# Patient Record
Sex: Female | Born: 1945
Health system: Southern US, Community
[De-identification: ages and names within clinical notes are randomized; demographics above are authoritative.]

## PROBLEM LIST (undated history)

## (undated) DIAGNOSIS — D472 Monoclonal gammopathy: Secondary | ICD-10-CM

## (undated) DIAGNOSIS — I5030 Unspecified diastolic (congestive) heart failure: Secondary | ICD-10-CM

## (undated) DIAGNOSIS — Z9289 Personal history of other medical treatment: Secondary | ICD-10-CM

## (undated) DIAGNOSIS — Z862 Personal history of diseases of the blood and blood-forming organs and certain disorders involving the immune mechanism: Secondary | ICD-10-CM

## (undated) DIAGNOSIS — M069 Rheumatoid arthritis, unspecified: Secondary | ICD-10-CM

## (undated) DIAGNOSIS — M1990 Unspecified osteoarthritis, unspecified site: Secondary | ICD-10-CM

## (undated) DIAGNOSIS — D649 Anemia, unspecified: Secondary | ICD-10-CM

## (undated) DIAGNOSIS — E119 Type 2 diabetes mellitus without complications: Secondary | ICD-10-CM

## (undated) DIAGNOSIS — E8581 Light chain (AL) amyloidosis: Secondary | ICD-10-CM

## (undated) DIAGNOSIS — N189 Chronic kidney disease, unspecified: Secondary | ICD-10-CM

## (undated) DIAGNOSIS — E7800 Pure hypercholesterolemia, unspecified: Secondary | ICD-10-CM

## (undated) DIAGNOSIS — I10 Essential (primary) hypertension: Secondary | ICD-10-CM

## (undated) DIAGNOSIS — N183 Chronic kidney disease, stage 3 unspecified: Secondary | ICD-10-CM

## (undated) HISTORY — DX: Unspecified osteoarthritis, unspecified site: M19.90

## (undated) HISTORY — DX: Chronic kidney disease, stage 3 unspecified: N18.30

## (undated) HISTORY — DX: Personal history of other medical treatment: Z92.89

## (undated) HISTORY — DX: Light chain (AL) amyloidosis: E85.81

## (undated) HISTORY — PX: HERNIA REPAIR: SHX51

## (undated) HISTORY — PX: PARATHYROIDECTOMY: SHX19

## (undated) HISTORY — DX: Unspecified diastolic (congestive) heart failure: I50.30

## (undated) HISTORY — DX: Anemia, unspecified: D64.9

## (undated) HISTORY — DX: Essential (primary) hypertension: I10

## (undated) HISTORY — DX: Chronic kidney disease, unspecified: N18.9

## (undated) HISTORY — DX: Personal history of diseases of the blood and blood-forming organs and certain disorders involving the immune mechanism: Z86.2

## (undated) HISTORY — DX: Rheumatoid arthritis, unspecified: M06.9

## (undated) HISTORY — DX: Monoclonal gammopathy: D47.2

---

## 2011-06-21 DIAGNOSIS — Z9089 Acquired absence of other organs: Secondary | ICD-10-CM | POA: Insufficient documentation

## 2011-06-21 DIAGNOSIS — Z9889 Other specified postprocedural states: Secondary | ICD-10-CM | POA: Insufficient documentation

## 2011-06-21 DIAGNOSIS — G4733 Obstructive sleep apnea (adult) (pediatric): Secondary | ICD-10-CM | POA: Insufficient documentation

## 2014-11-30 IMAGING — CR DG SHOULDER 2+V*L*
1 series · 4 of 4 positions shown · non-contrast
Comparison: None.

CLINICAL DATA: Left shoulder base starting yesterday morning

EXAM:
LEFT SHOULDER - 2+ VIEW

[Series 1: dg shoulder left · 0.14mm/px · 4 of 4 slices shown]
[im 1/4]
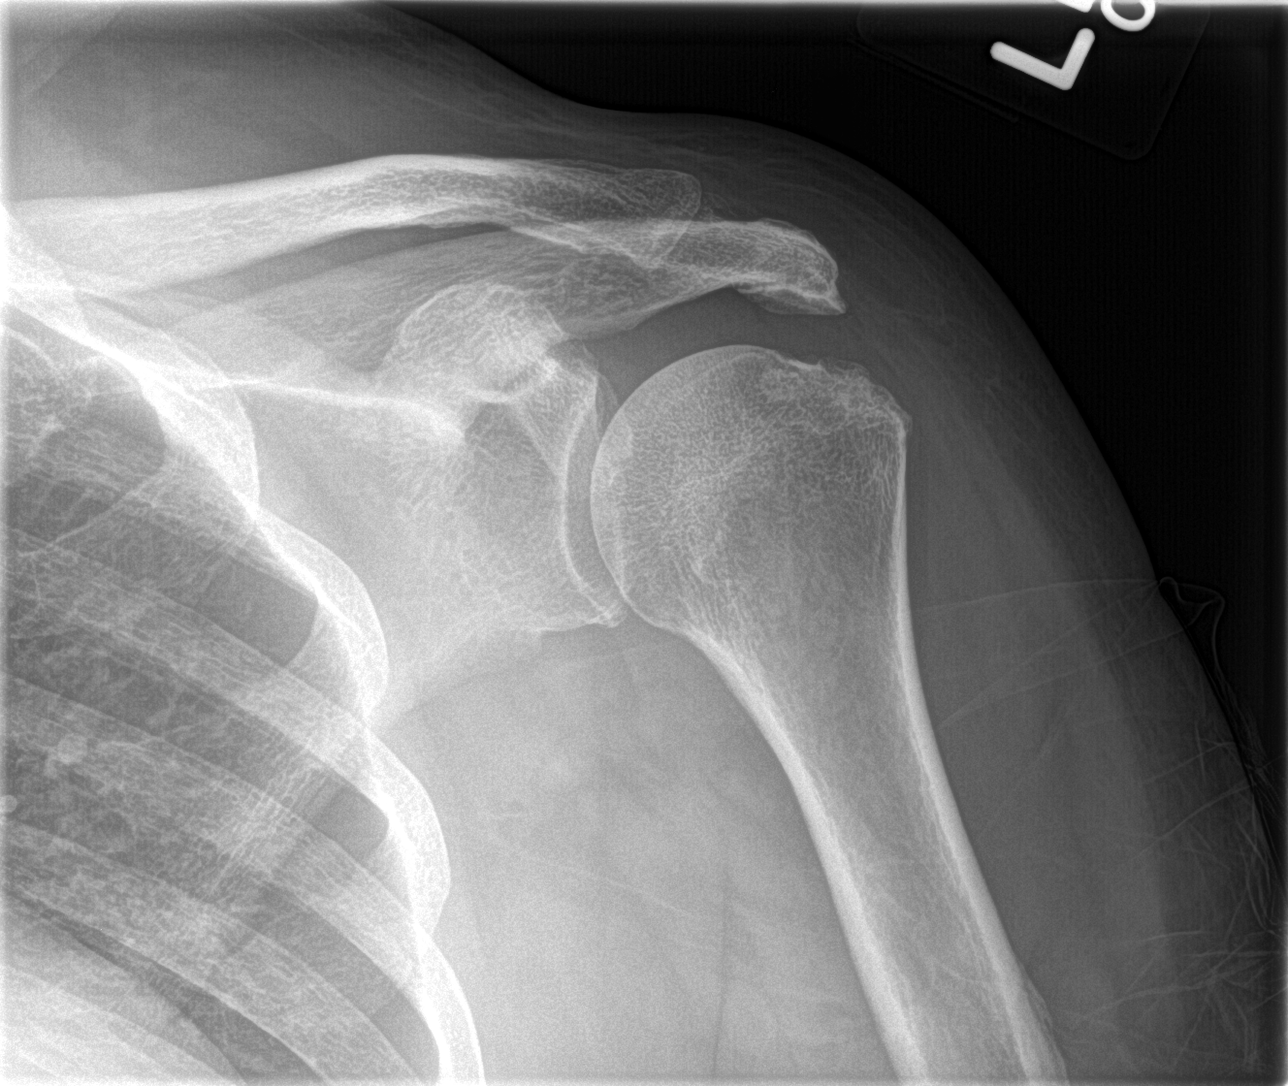
[im 2/4]
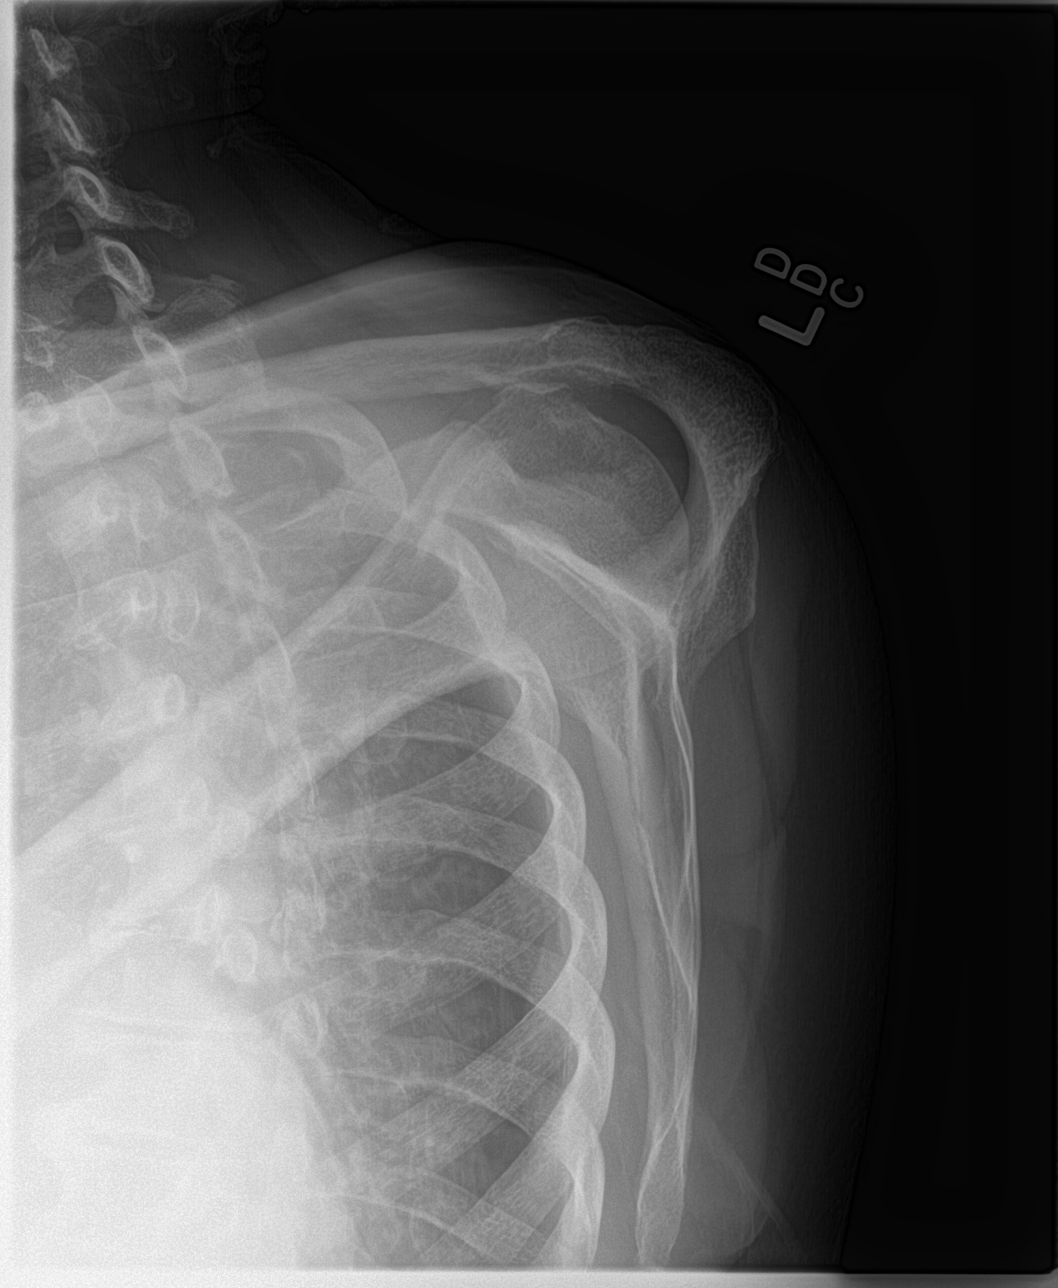
[im 3/4]
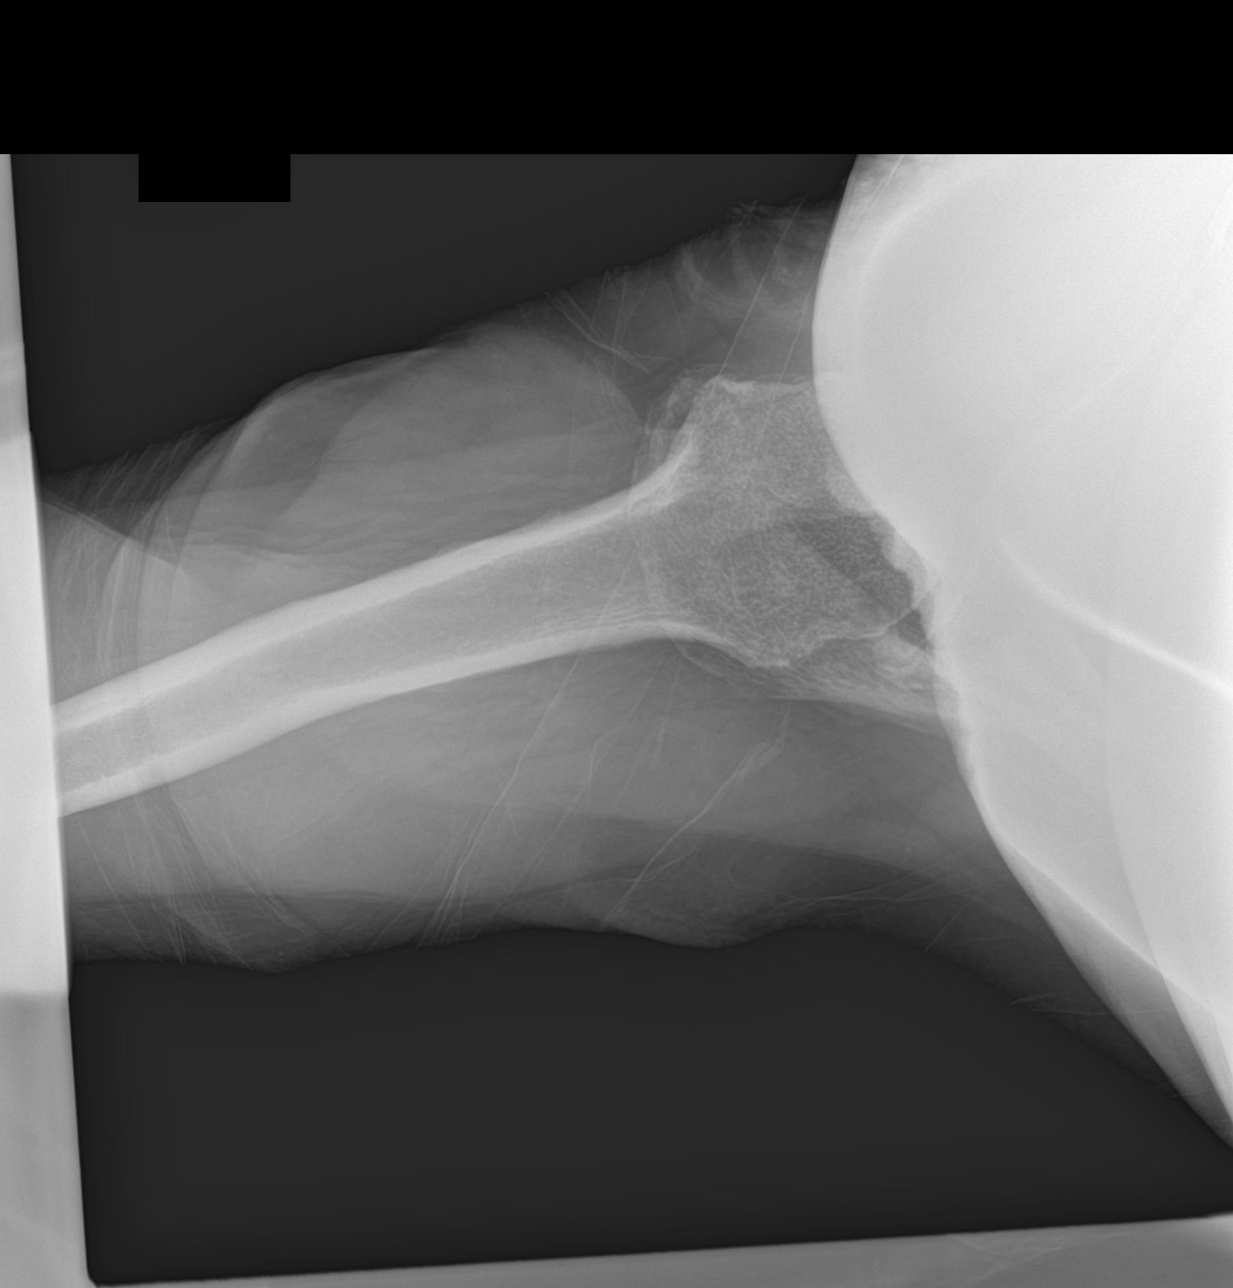
[im 4/4]
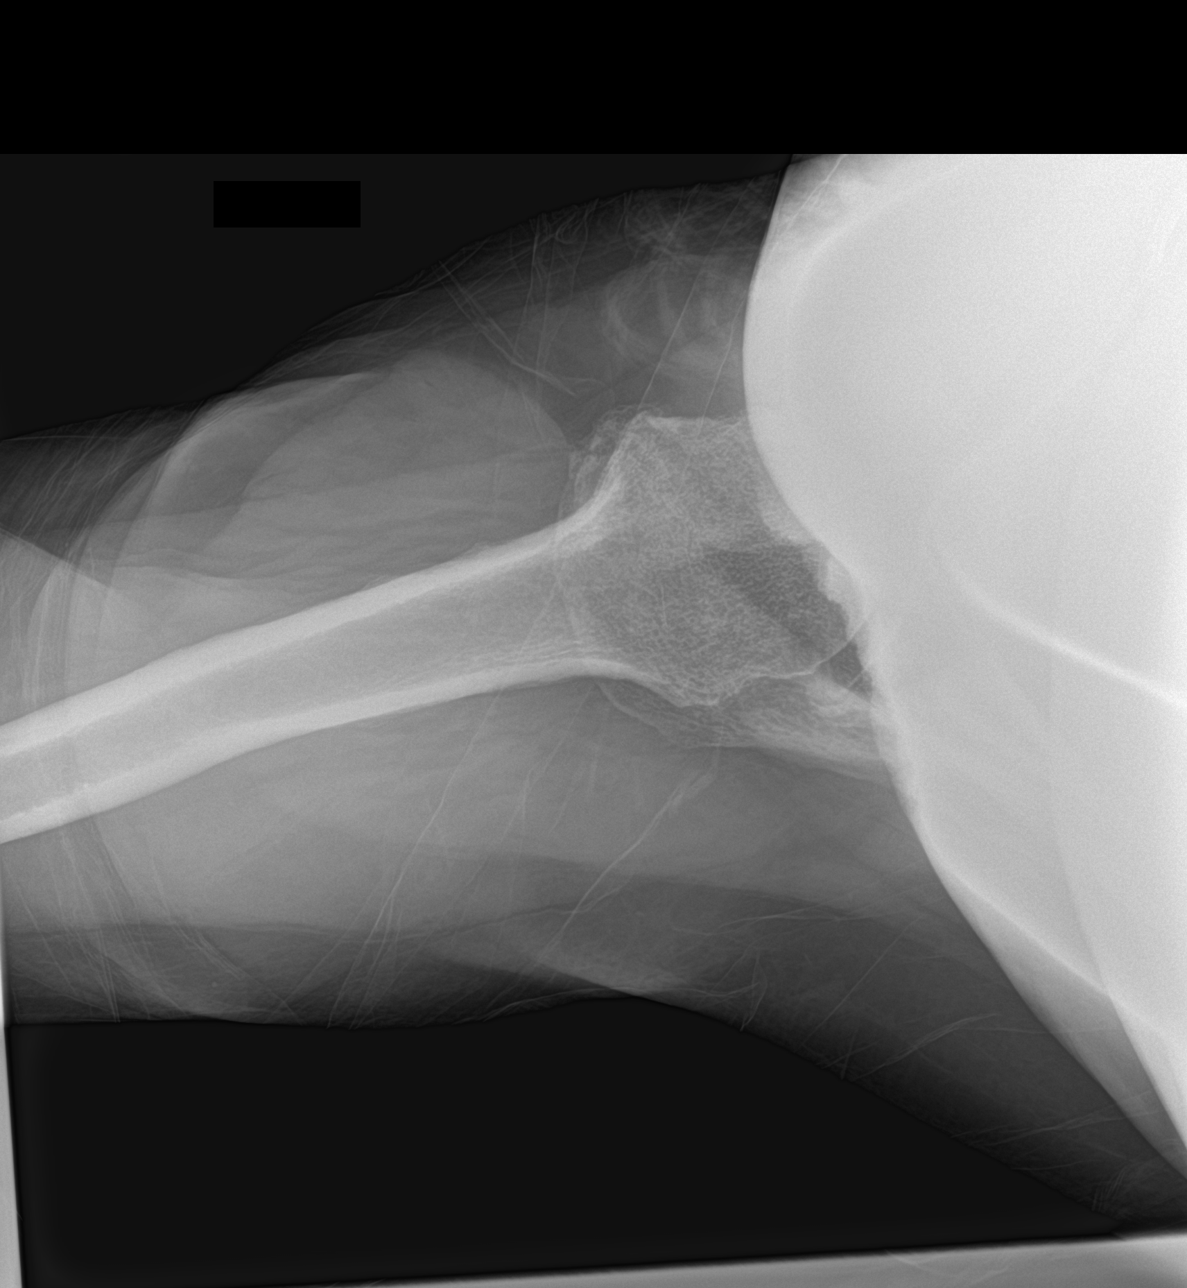

[4 of 4 positions shown; findings below may reference images not displayed]

FINDINGS: Four views of the left shoulder submitted. No acute fracture or
subluxation. Mild degenerative changes AC joint. Mild inferior
spurring of acromion. Mild spurring of superior aspect of glenoid.
IMPRESSION: No acute fracture or subluxation. Degenerative changes as described
above.

## 2014-12-01 ENCOUNTER — Encounter: Payer: Self-pay | Admitting: Emergency Medicine

## 2014-12-01 ENCOUNTER — Emergency Department: Payer: Medicare Other

## 2014-12-01 ENCOUNTER — Emergency Department
Admission: EM | Admit: 2014-12-01 | Discharge: 2014-12-01 | Disposition: A | Discharge status: Home / Self Care | Payer: Medicare Other | Attending: Emergency Medicine | Admitting: Emergency Medicine

## 2014-12-01 ENCOUNTER — Other Ambulatory Visit: Payer: Self-pay

## 2014-12-01 DIAGNOSIS — E119 Type 2 diabetes mellitus without complications: Secondary | ICD-10-CM | POA: Diagnosis not present

## 2014-12-01 DIAGNOSIS — R1013 Epigastric pain: Secondary | ICD-10-CM | POA: Insufficient documentation

## 2014-12-01 DIAGNOSIS — M25512 Pain in left shoulder: Secondary | ICD-10-CM | POA: Diagnosis not present

## 2014-12-01 HISTORY — DX: Type 2 diabetes mellitus without complications: E11.9

## 2014-12-01 HISTORY — DX: Pure hypercholesterolemia, unspecified: E78.00

## 2014-12-01 LAB — COMPREHENSIVE METABOLIC PANEL
ALT: 18 U/L (ref 14–54)
ANION GAP: 10 (ref 5–15)
AST: 22 U/L (ref 15–41)
Albumin: 4.1 g/dL (ref 3.5–5.0)
Alkaline Phosphatase: 48 U/L (ref 38–126)
BILIRUBIN TOTAL: 0.9 mg/dL (ref 0.3–1.2)
BUN: 23 mg/dL — ABNORMAL HIGH (ref 6–20)
CHLORIDE: 102 mmol/L (ref 101–111)
CO2: 30 mmol/L (ref 22–32)
Calcium: 10.1 mg/dL (ref 8.9–10.3)
Creatinine, Ser: 1.17 mg/dL — ABNORMAL HIGH (ref 0.44–1.00)
GFR, EST AFRICAN AMERICAN: 53 mL/min — AB (ref >60)
GFR, EST NON AFRICAN AMERICAN: 46 mL/min — AB (ref >60)
Glucose, Bld: 172 mg/dL — ABNORMAL HIGH (ref 65–99)
POTASSIUM: 3.2 mmol/L — AB (ref 3.5–5.1)
Sodium: 142 mmol/L (ref 135–145)
TOTAL PROTEIN: 8 g/dL (ref 6.5–8.1)

## 2014-12-01 LAB — CBC WITH DIFFERENTIAL/PLATELET
BASOS ABS: 0.1 10*3/uL (ref 0–0.1)
Basophils Relative: 1 %
EOS PCT: 3 %
Eosinophils Absolute: 0.2 10*3/uL (ref 0–0.7)
HCT: 38.8 % (ref 35.0–47.0)
Hemoglobin: 12.2 g/dL (ref 12.0–16.0)
LYMPHS ABS: 3.5 10*3/uL (ref 1.0–3.6)
LYMPHS PCT: 46 %
MCH: 24.2 pg — AB (ref 26.0–34.0)
MCHC: 31.3 g/dL — AB (ref 32.0–36.0)
MCV: 77.2 fL — AB (ref 80.0–100.0)
MONO ABS: 0.6 10*3/uL (ref 0.2–0.9)
MONOS PCT: 8 %
Neutro Abs: 3.2 10*3/uL (ref 1.4–6.5)
Neutrophils Relative %: 42 %
PLATELETS: 338 10*3/uL (ref 150–440)
RBC: 5.03 MIL/uL (ref 3.80–5.20)
RDW: 17.5 % — AB (ref 11.5–14.5)
WBC: 7.5 10*3/uL (ref 3.6–11.0)

## 2014-12-01 LAB — TROPONIN I

## 2014-12-01 MED ORDER — TRAMADOL HCL 50 MG PO TABS: 50.0000 mg | ORAL_TABLET | Freq: Four times a day (QID) | ORAL | Status: AC | PRN

## 2014-12-01 MED ORDER — DIAZEPAM 5 MG PO TABS: 5.0000 mg | ORAL_TABLET | Freq: Three times a day (TID) | ORAL | Status: DC | PRN

## 2014-12-01 MED ORDER — OXYCODONE-ACETAMINOPHEN 5-325 MG PO TABS
2.0000 | ORAL_TABLET | Freq: Once | ORAL | Status: AC
  Administered 2014-12-01: 2 via ORAL
  Filled 2014-12-01: qty 2

## 2014-12-01 NOTE — ED Notes (Signed)
Pt reports having left shoulder pain onset yesterday at 10am, during the night started having "indigestion"

## 2014-12-01 NOTE — Discharge Instructions (Signed)

## 2014-12-01 NOTE — ED Notes (Signed)
Discussed discharge instructions with the patient and family member. No questions or concerns at this time. Pt left with family.

## 2014-12-01 NOTE — ED Provider Notes (Signed)
[facility] Emergency Department Provider Note     Time seen: ----------------------------------------- 7:28 AM on 12/01/2014 -----------------------------------------    I have reviewed the triage vital signs and the nursing notes.   HISTORY  Chief Complaint Shoulder Pain    HPI Nancy Rodriguez is a 69 y.o. female who presents ER with left shoulder pain since yesterday morning at 10 AM. Patient states during the night she started having indigestion as well, recently has had some dietary changes and she has lost weight which is resulted and lower blood sugars. There is pain with range of motion of the left shoulder, denies any other complaints currently   Past Medical History  Diagnosis Date  . Diabetes mellitus without complication   . Hypercholesterolemia     There are no active problems to display for this patient.   Past Surgical History  Procedure Laterality Date  . Parathyroidectomy    . Hernia repair      Allergies Nsaids  Social History Social History  Substance Use Topics  . Smoking status: Never Smoker   . Smokeless tobacco: None  . Alcohol Use: No    Review of Systems Constitutional: Negative for fever. Eyes: Negative for visual changes. ENT: Negative for sore throat. Cardiovascular: Negative for chest pain. Respiratory: Negative for shortness of breath. Gastrointestinal: Negative for abdominal pain, vomiting and diarrhea. Positive for indigestion Genitourinary: Negative for dysuria. Musculoskeletal: Negative for back pain. Positive for left shoulder pain Skin: Negative for rash. Neurological: Negative for headaches, focal weakness or numbness.  10-point ROS otherwise negative.  ____________________________________________   PHYSICAL EXAM:  VITAL SIGNS: ED Triage Vitals  Enc Vitals Group     BP 12/01/14 0712 178/100 mmHg     Pulse Rate 12/01/14 0712 111     Resp 12/01/14 0712 26     Temp 12/01/14 0712 98  F (36.7 C)     Temp src --      SpO2 12/01/14 0712 100 %     Weight 12/01/14 0712 169 lb (76.658 kg)     Height 12/01/14 0712 5\' 3"  (1.6 m)     Head Cir --      Peak Flow --      Pain Score 12/01/14 0712 10     Pain Loc --      Pain Edu? --      Excl. in [place]? --     Constitutional: Alert and oriented. Well appearing and in no distress. Eyes: Conjunctivae are normal. PERRL. Normal extraocular movements. ENT   Head: Normocephalic and atraumatic.   Nose: No congestion/rhinnorhea.   Mouth/Throat: Mucous membranes are moist.   Neck: No stridor. Cardiovascular: Normal rate, regular rhythm. Normal and symmetric distal pulses are present in all extremities. No murmurs, rubs, or gallops. Respiratory: Normal respiratory effort without tachypnea nor retractions. Breath sounds are clear and equal bilaterally. No wheezes/rales/rhonchi. Gastrointestinal: Soft and nontender. No distention. No abdominal bruits.  Musculoskeletal: Mild tenderness about the left shoulder, mild pain with range of motion of the left arm. Neurologic:  Normal speech and language. No gross focal neurologic deficits are appreciated. Speech is normal. No gait instability. Skin:  Skin is warm, dry and intact. No rash noted. Psychiatric: Mood and affect are normal. Speech and behavior are normal. Patient exhibits appropriate insight and judgment. ____________________________________________  EKG: Interpreted by me. Normal sinus rhythm with a rate of 100, low voltage QRS, possible inferior infarct age indeterminate, normal axis.  ____________________________________________  ED COURSE:  Pertinent labs &  imaging results that were available during my care of the patient were reviewed by me and considered in my medical decision making (see chart for details). We'll check cardiac labs as well as obtain shoulder x-rays ____________________________________________    LABS (pertinent positives/negatives)  Labs  Reviewed  CBC WITH DIFFERENTIAL/PLATELET - Abnormal; Notable for the following:    MCV 77.2 (*)    MCH 24.2 (*)    MCHC 31.3 (*)    RDW 17.5 (*)    All other components within normal limits  COMPREHENSIVE METABOLIC PANEL - Abnormal; Notable for the following:    Potassium 3.2 (*)    Glucose, Bld 172 (*)    BUN 23 (*)    Creatinine, Ser 1.17 (*)    GFR calc non Af Amer 46 (*)    GFR calc Af Amer 53 (*)    All other components within normal limits  TROPONIN I    RADIOLOGY Images were viewed by me  Left shoulder films IMPRESSION: No acute fracture or subluxation. Degenerative changes as described above. ____________________________________________  FINAL ASSESSMENT AND PLAN  Shoulder pain  Plan: Patient with labs and imaging as dictated above. Symptoms are likely arthritis related. Her labs are unremarkable. No signs of any cardiac etiology. She stable for outpatient follow-up with her doctor   Earleen Newport, MD   Earleen Newport, MD 12/01/14 [phone]

## 2016-06-12 DIAGNOSIS — F331 Major depressive disorder, recurrent, moderate: Secondary | ICD-10-CM | POA: Insufficient documentation

## 2019-08-25 ENCOUNTER — Other Ambulatory Visit: Payer: Self-pay | Admitting: Nephrology

## 2019-08-25 DIAGNOSIS — E876 Hypokalemia: Secondary | ICD-10-CM

## 2019-08-25 DIAGNOSIS — N1832 Chronic kidney disease, stage 3b: Secondary | ICD-10-CM

## 2019-08-25 DIAGNOSIS — N189 Chronic kidney disease, unspecified: Secondary | ICD-10-CM

## 2019-08-25 DIAGNOSIS — D631 Anemia in chronic kidney disease: Secondary | ICD-10-CM

## 2019-08-25 DIAGNOSIS — N179 Acute kidney failure, unspecified: Secondary | ICD-10-CM

## 2019-08-31 ENCOUNTER — Other Ambulatory Visit: Payer: Self-pay

## 2019-08-31 ENCOUNTER — Ambulatory Visit
Admission: RE | Admit: 2019-08-31 | Discharge: 2019-08-31 | Disposition: A | Discharge status: Home / Self Care | Payer: Medicare Other | Source: Ambulatory Visit | Attending: Nephrology | Admitting: Nephrology

## 2019-08-31 DIAGNOSIS — N1832 Chronic kidney disease, stage 3b: Secondary | ICD-10-CM | POA: Insufficient documentation

## 2019-08-31 DIAGNOSIS — N179 Acute kidney failure, unspecified: Secondary | ICD-10-CM | POA: Diagnosis present

## 2019-08-31 DIAGNOSIS — D631 Anemia in chronic kidney disease: Secondary | ICD-10-CM

## 2019-08-31 DIAGNOSIS — E876 Hypokalemia: Secondary | ICD-10-CM

## 2019-08-31 DIAGNOSIS — N189 Chronic kidney disease, unspecified: Secondary | ICD-10-CM | POA: Insufficient documentation

## 2019-08-31 IMAGING — US US RENAL
1 series · 14 of 25 positions shown · non-contrast
Comparison: None available.

CLINICAL DATA: Initial evaluation for stage III B chronic kidney
disease.

EXAM:
RENAL / URINARY TRACT ULTRASOUND COMPLETE

[Series 1: us renal · 14 of 32 slices shown]
[im 1/32]
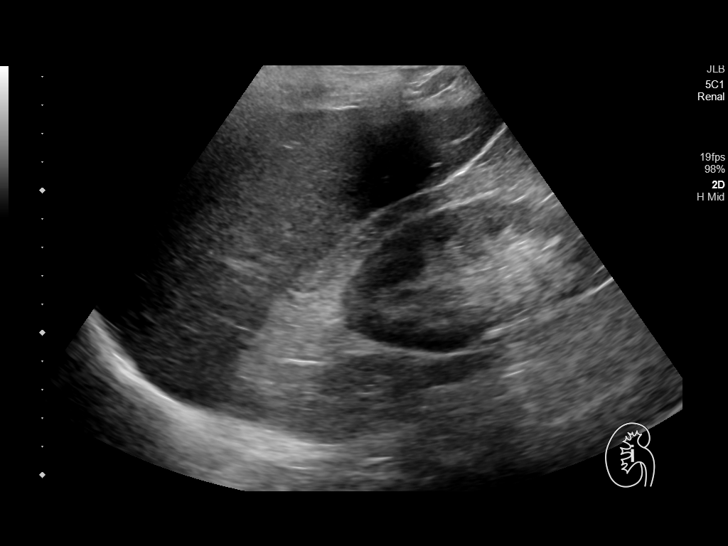
[im 3/32]
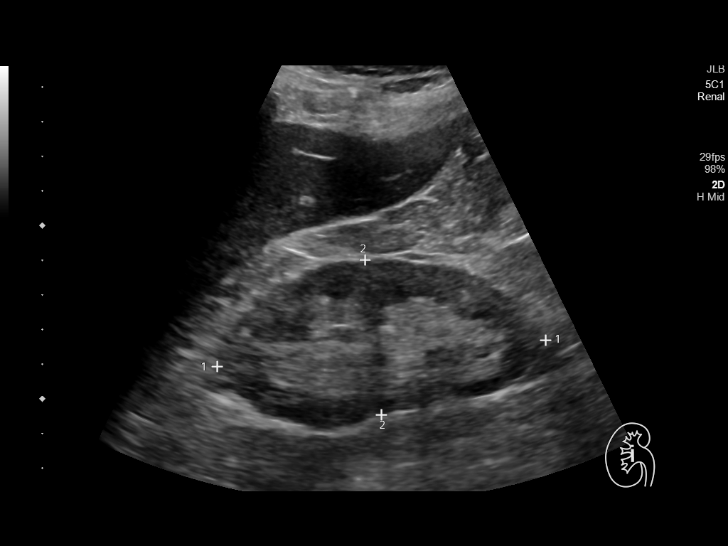
[im 6/32]
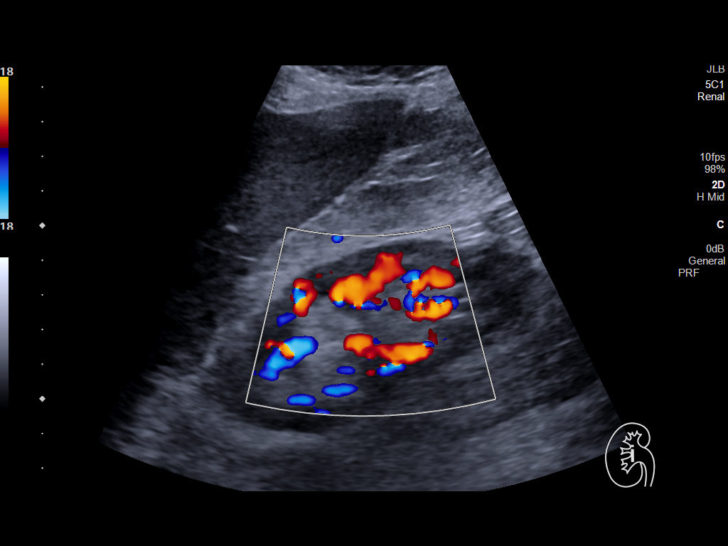
[im 8/32]
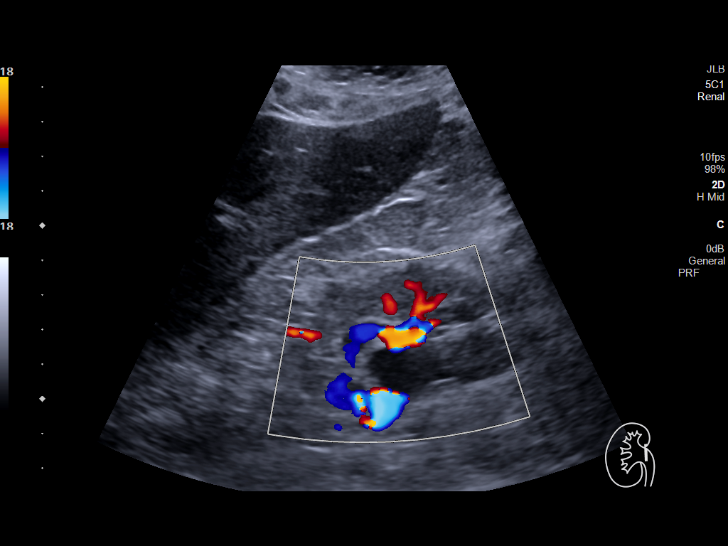
[im 11/32]
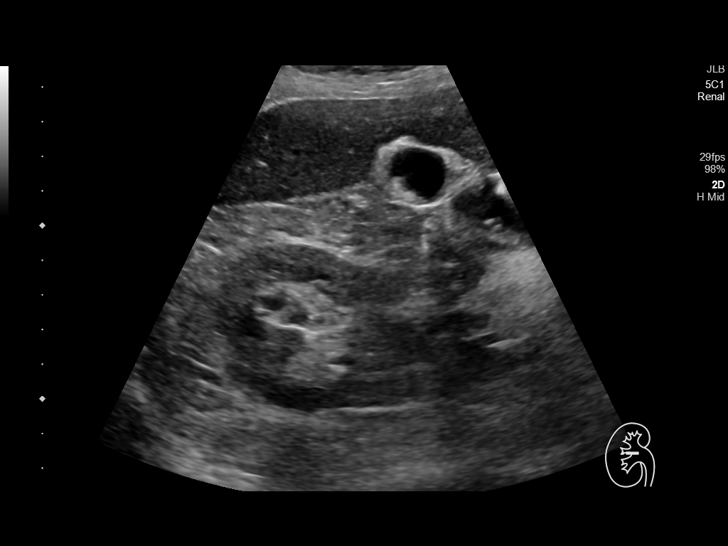
[im 12/32]
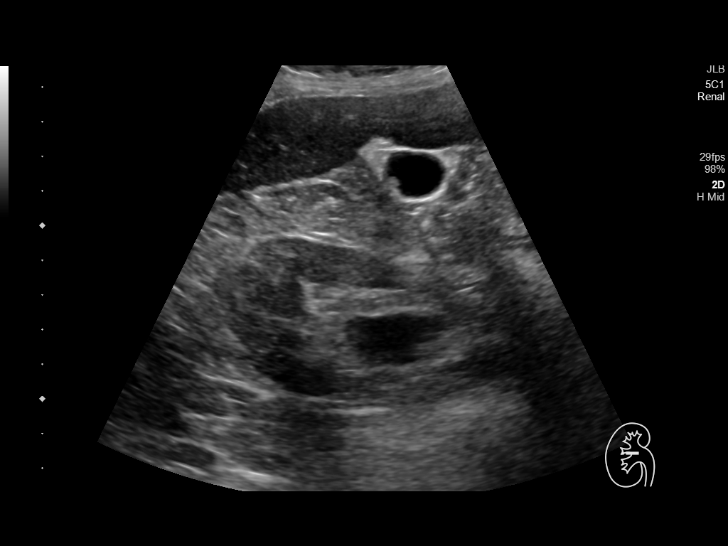
[im 15/32]
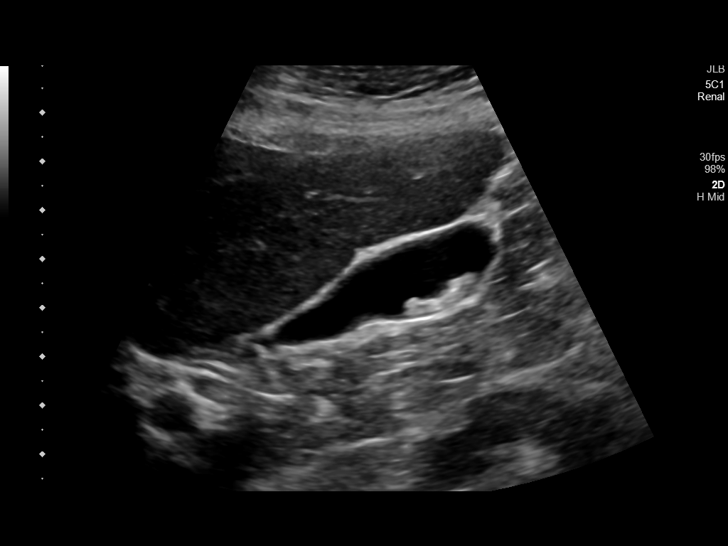
[im 17/32]
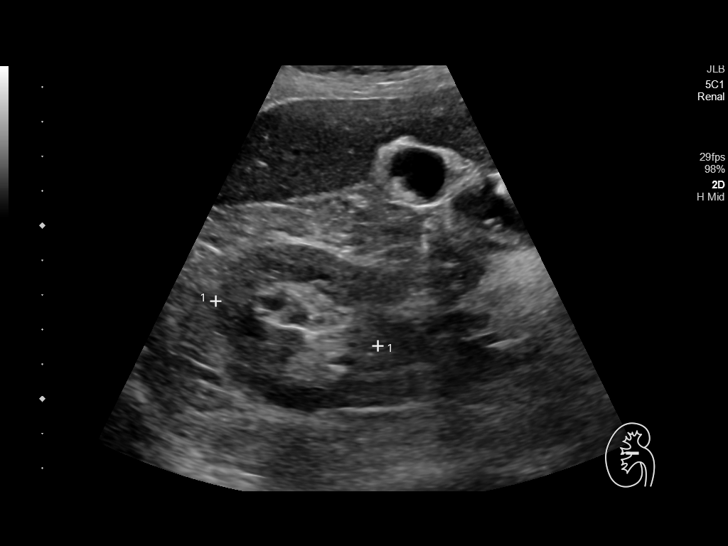
[im 20/32]
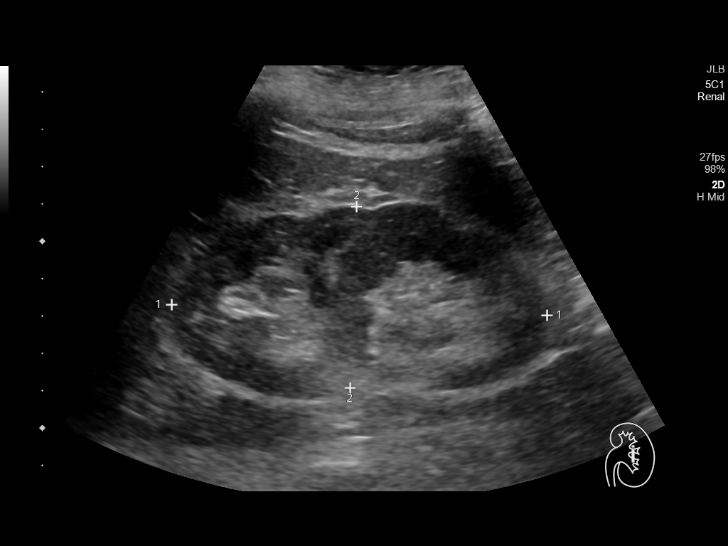
[im 21/32]
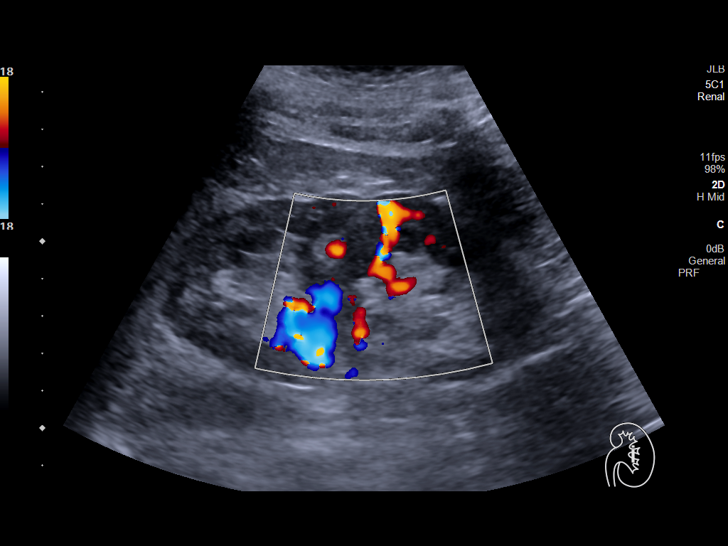
[im 24/32]
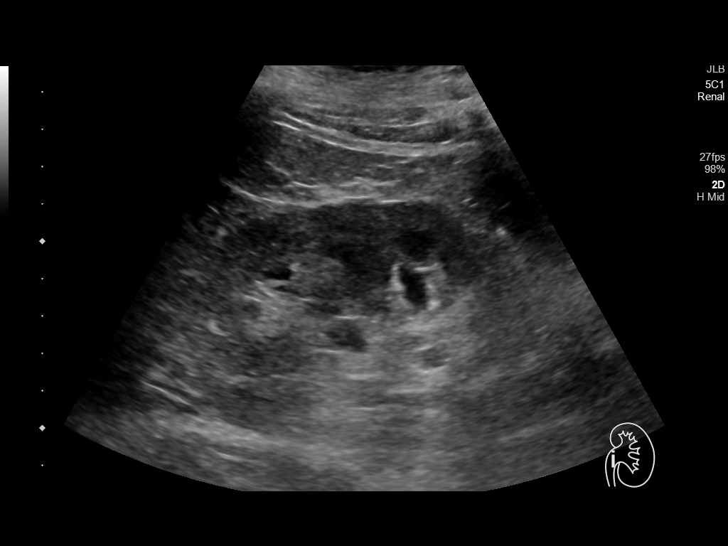
[im 26/32]
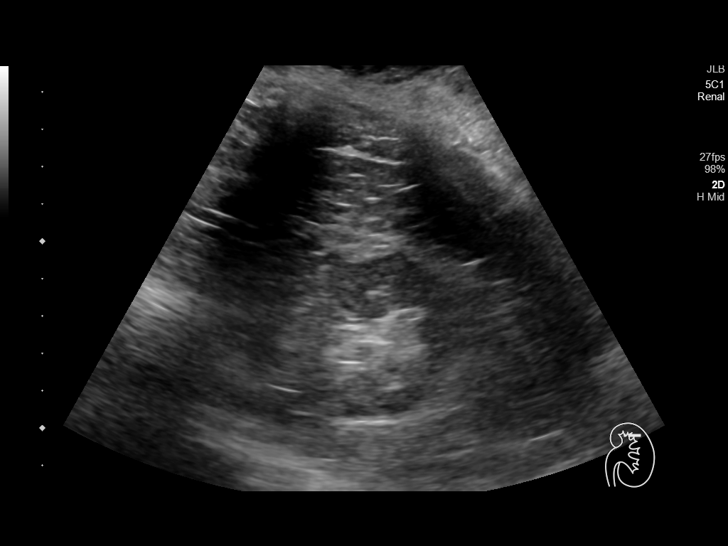
[im 29/32]
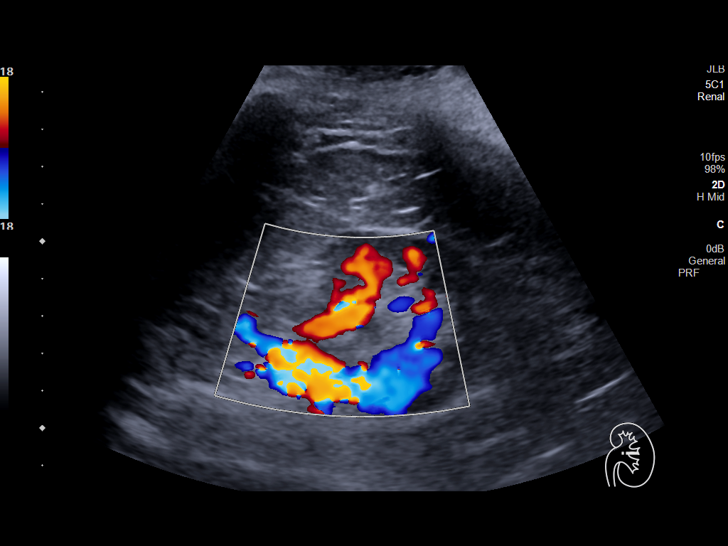
[im 32/32]
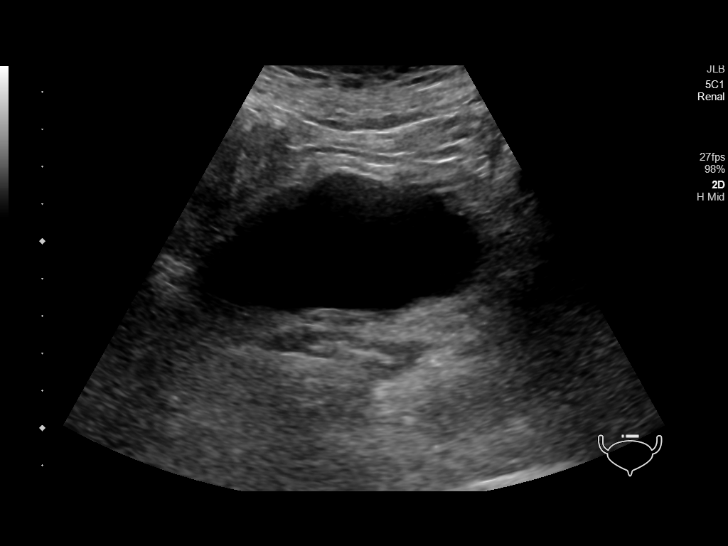

[14 of 25 positions shown; findings below may reference images not displayed]

FINDINGS: Right Kidney:

Renal measurements: 9.5 x 4.5 x 4.9 cm = volume: 108.7 mL. Diffuse
cortical thinning seen about the right kidney. Renal echogenicity
within normal limits. No nephrolithiasis or hydronephrosis. No focal
renal mass.

Left Kidney:

Renal measurements: 10.1 x 4.9 x 4.4 cm = volume: 111.5 mL. Diffuse
cortical thinning seen about the left kidney. Renal echogenicity
within normal limits. No nephrolithiasis or hydronephrosis. No focal
renal mass.

Bladder:

Appears normal for degree of bladder distention.

Other:

Cholelithiasis incidentally noted within the gallbladder.
IMPRESSION: 1. Diffuse cortical thinning about the kidneys bilaterally,
compatible with atrophy/chronic medical renal disease. No
hydronephrosis or other significant finding.
2. Incidental cholelithiasis.

## 2019-09-02 ENCOUNTER — Other Ambulatory Visit: Payer: Self-pay

## 2019-09-02 ENCOUNTER — Encounter: Payer: Self-pay | Admitting: Oncology

## 2019-09-02 ENCOUNTER — Inpatient Hospital Stay: Payer: Medicare Other | Attending: Oncology | Admitting: Oncology

## 2019-09-02 ENCOUNTER — Inpatient Hospital Stay: Payer: Medicare Other

## 2019-09-02 VITALS — BP 132/79 | HR 73 | Temp 96.7°F | Resp 18 | Wt 151.1 lb

## 2019-09-02 DIAGNOSIS — D631 Anemia in chronic kidney disease: Secondary | ICD-10-CM | POA: Diagnosis not present

## 2019-09-02 DIAGNOSIS — R718 Other abnormality of red blood cells: Secondary | ICD-10-CM | POA: Diagnosis not present

## 2019-09-02 DIAGNOSIS — Z79899 Other long term (current) drug therapy: Secondary | ICD-10-CM | POA: Diagnosis not present

## 2019-09-02 DIAGNOSIS — I129 Hypertensive chronic kidney disease with stage 1 through stage 4 chronic kidney disease, or unspecified chronic kidney disease: Secondary | ICD-10-CM | POA: Insufficient documentation

## 2019-09-02 DIAGNOSIS — D473 Essential (hemorrhagic) thrombocythemia: Secondary | ICD-10-CM

## 2019-09-02 DIAGNOSIS — R778 Other specified abnormalities of plasma proteins: Secondary | ICD-10-CM

## 2019-09-02 DIAGNOSIS — D75839 Thrombocytosis, unspecified: Secondary | ICD-10-CM

## 2019-09-02 DIAGNOSIS — N1832 Chronic kidney disease, stage 3b: Secondary | ICD-10-CM | POA: Diagnosis not present

## 2019-09-02 LAB — CBC WITH DIFFERENTIAL/PLATELET
Abs Immature Granulocytes: 0.03 10*3/uL (ref 0.00–0.07)
Basophils Absolute: 0.1 10*3/uL (ref 0.0–0.1)
Basophils Relative: 1 %
Eosinophils Absolute: 0.2 10*3/uL (ref 0.0–0.5)
Eosinophils Relative: 3 %
HCT: 37.4 % (ref 36.0–46.0)
Hemoglobin: 12.2 g/dL (ref 12.0–15.0)
Immature Granulocytes: 0 %
Lymphocytes Relative: 30 %
Lymphs Abs: 2.1 10*3/uL (ref 0.7–4.0)
MCH: 25.3 pg — ABNORMAL LOW (ref 26.0–34.0)
MCHC: 32.6 g/dL (ref 30.0–36.0)
MCV: 77.4 fL — ABNORMAL LOW (ref 80.0–100.0)
Monocytes Absolute: 0.5 10*3/uL (ref 0.1–1.0)
Monocytes Relative: 7 %
Neutro Abs: 4.2 10*3/uL (ref 1.7–7.7)
Neutrophils Relative %: 59 %
Platelets: 459 10*3/uL — ABNORMAL HIGH (ref 150–400)
RBC: 4.83 MIL/uL (ref 3.87–5.11)
RDW: 17.7 % — ABNORMAL HIGH (ref 11.5–15.5)
WBC: 7.1 10*3/uL (ref 4.0–10.5)
nRBC: 0 % (ref 0.0–0.2)

## 2019-09-02 LAB — COMPREHENSIVE METABOLIC PANEL
ALT: 27 U/L (ref 0–44)
AST: 31 U/L (ref 15–41)
Albumin: 3.9 g/dL (ref 3.5–5.0)
Alkaline Phosphatase: 170 U/L — ABNORMAL HIGH (ref 38–126)
Anion gap: 9 (ref 5–15)
BUN: 24 mg/dL — ABNORMAL HIGH (ref 8–23)
CO2: 29 mmol/L (ref 22–32)
Calcium: 9.4 mg/dL (ref 8.9–10.3)
Chloride: 100 mmol/L (ref 98–111)
Creatinine, Ser: 1.15 mg/dL — ABNORMAL HIGH (ref 0.44–1.00)
GFR calc Af Amer: 54 mL/min — ABNORMAL LOW (ref >60)
GFR calc non Af Amer: 47 mL/min — ABNORMAL LOW (ref >60)
Glucose, Bld: 216 mg/dL — ABNORMAL HIGH (ref 70–99)
Potassium: 3.6 mmol/L (ref 3.5–5.1)
Sodium: 138 mmol/L (ref 135–145)
Total Bilirubin: 1.1 mg/dL (ref 0.3–1.2)
Total Protein: 8.4 g/dL — ABNORMAL HIGH (ref 6.5–8.1)

## 2019-09-02 LAB — TECHNOLOGIST SMEAR REVIEW: Tech Review: NORMAL

## 2019-09-02 NOTE — Progress Notes (Signed)
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  Work up include protein electrophoresis showed M protein 0.7,   Reviewed her previous medical records via care everywhere.  She was seen by Hematology Oncology at [facility] on 02/14/2017.  07/25/2007  SPEP showed M protein of 0.35, IFE showed IgG lamda 09/22/2007  Bone survey negative.  02/15/14 IgG 930, SPEP M protien 0.22, free kappa light chain 3.59, lamda 2.92, ratio 1.23  Patient was accompanied by her daughter.  Patient denies any bone pain. She does not take calcium supplements. Denies weight loss, fever, chills, fatigue, night sweats.    Review of Systems  Constitutional: Negative for appetite change, chills, fatigue and fever.  HENT:   Negative for hearing loss and voice change.   Eyes: Negative for eye problems.  Respiratory: Negative for chest tightness and cough.   Cardiovascular: Negative for chest pain.  Gastrointestinal: Negative for abdominal distention, abdominal pain and blood in stool.  Endocrine: Negative for hot flashes.  Genitourinary: Negative for difficulty urinating and frequency.   Musculoskeletal: Negative for arthralgias.  Skin: Negative for itching and rash.  Neurological: Negative for extremity weakness.  Hematological: Negative for adenopathy.  Psychiatric/Behavioral: Negative for confusion.    MEDICAL HISTORY:  Past Medical History:  Diagnosis Date   . Anemia    Anemia in chronic kidney disease  . Chronic kidney disease    Stage 3b chronic kidney disease  . Diabetes mellitus without complication ([place])   . Hypercholesterolemia   . Hypertension   . Osteoarthritis   . Rheumatoid arthritis ([place])     SURGICAL HISTORY: Past Surgical History:  Procedure Laterality Date  . HERNIA REPAIR    . PARATHYROIDECTOMY      SOCIAL HISTORY: Social History   Socioeconomic History  . Marital status: Widowed    Spouse name: Not on file  . Number of children: Not on file  . Years of education: Not on file  . Highest education level: Not on file  Occupational History  . Not on file  Tobacco Use  . Smoking status: Never Smoker  Substance and Sexual Activity  . Alcohol use: No  . Drug use: Never  . Sexual activity: Not on file  Other Topics Concern  . Not on file  Social History Narrative  . Not on file   Social Determinants of Health   Financial Resource Strain:   . Difficulty of Paying Living Expenses:   Food Insecurity:   . Worried About Charity fundraiser in the Last Year:   . Arboriculturist in the Last Year:   Transportation Needs:   . Film/video editor (Medical):   Marland Kitchen Lack of Transportation (Non-Medical):   Physical Activity:   . Days of Exercise per Week:   . Minutes of Exercise per Session:   Stress:   . Feeling of Stress :   Social Connections:   . Frequency of Communication with Friends and Family:   . Frequency of Social Gatherings  with Friends and Family:   . Attends Religious Services:   . Active Member of Clubs or Organizations:   . Attends Archivist Meetings:   Marland Kitchen Marital Status:   Intimate Partner Violence:   . Fear of Current or Ex-Partner:   . Emotionally Abused:   Marland Kitchen Physically Abused:   . Sexually Abused:     FAMILY HISTORY: History reviewed. No pertinent family history.  ALLERGIES:  is allergic to benazepril; lisinopril; tolmetin; and nsaids.  MEDICATIONS:  Current Outpatient  Medications  Medication Sig Dispense Refill  . albuterol (VENTOLIN HFA) 108 (90 Base) MCG/ACT inhaler Inhale into the lungs.    Marland Kitchen aspirin EC 81 MG tablet Take 81 mg by mouth daily.    Marland Kitchen EPINEPHrine 0.3 mg/0.3 mL IJ SOAJ injection as needed    . glipiZIDE (GLUCOTROL XL) 10 MG 24 hr tablet Take by mouth. 2 QAM 1 QPM    . metoprolol succinate (TOPROL-XL) 50 MG 24 hr tablet TAKE 1 TABLET (50 MG TOTAL) BY MOUTH DAILY IN THE MORNING    . potassium chloride SA (KLOR-CON) 20 MEQ tablet Take by mouth.    . prednisoLONE acetate (PRED FORTE) 1 % ophthalmic suspension Apply to eye.    . rosuvastatin (CRESTOR) 20 MG tablet Take by mouth.    . diazepam (VALIUM) 5 MG tablet Take 1 tablet (5 mg total) by mouth every 8 (eight) hours as needed for muscle spasms. (Patient not taking: Reported on 09/02/2019) 20 tablet 0   No current facility-administered medications for this visit.     PHYSICAL EXAMINATION: ECOG PERFORMANCE STATUS: 1 - Symptomatic but completely ambulatory Vitals:   09/02/19 1531  BP: 132/79  Pulse: 73  Resp: 18  Temp: (!) 96.7 F (35.9 C)   Filed Weights   09/02/19 1531  Weight: 151 lb 1.6 oz (68.5 kg)    Physical Exam Constitutional:      General: She is not in acute distress. HENT:     Head: Normocephalic and atraumatic.  Eyes:     General: No scleral icterus. Cardiovascular:     Rate and Rhythm: Normal rate and regular rhythm.     Heart sounds: Normal heart sounds.  Pulmonary:     Effort: Pulmonary effort is normal. No respiratory distress.     Breath sounds: No wheezing.  Abdominal:     General: Bowel sounds are normal. There is no distension.     Palpations: Abdomen is soft.  Musculoskeletal:        General: No deformity. Normal range of motion.     Cervical back: Normal range of motion and neck supple.  Skin:    General: Skin is warm and dry.     Findings: No erythema or rash.  Neurological:     Mental Status: She is alert and oriented to person, place, and  time. Mental status is at baseline.     Cranial Nerves: No cranial nerve deficit.     Coordination: Coordination normal.  Psychiatric:        Mood and Affect: Mood normal.     LABORATORY DATA:  I have reviewed the data as listed Lab Results  Component Value Date   WBC 7.1 09/02/2019   HGB 12.2 09/02/2019   HCT 37.4 09/02/2019   MCV 77.4 (L) 09/02/2019   PLT 459 (H) 09/02/2019   Recent Labs    09/02/19 1608  NA 138  K 3.6  CL 100  CO2 29  GLUCOSE 216*  BUN 24*  CREATININE 1.15*  CALCIUM 9.4  GFRNONAA 47*  GFRAA 54*  PROT 8.4*  ALBUMIN 3.9  AST 31  ALT 27  ALKPHOS 170*  BILITOT 1.1   Iron/TIBC/Ferritin/ %Sat No results found for: IRON, TIBC, FERRITIN, IRONPCTSAT   08/25/2019, platelet count 491, WBC 7.5, hemoglobin 12 Creatinine 1.58, EGFR 37, calcium 10.8, albumin 4.2 Negative hepatitis B surface antigen, hepatitis B core antibody, Negative hepatitis C 08/05/2019, A1c 11.2   RADIOGRAPHIC STUDIES: I have personally reviewed the radiological images as listed and agreed with the findings in the report. 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 EXAM: RENAL / URINARY TRACT ULTRASOUND COMPLETE COMPARISON:  None available. FINDINGS: Right Kidney: Renal measurements: 9.5 x 4.5 x 4.9 cm = volume: 108.7 mL. Diffuse cortical thinning seen about the right kidney. Renal echogenicity within normal limits. No nephrolithiasis or hydronephrosis. No focal renal mass. Left Kidney: Renal measurements: 10.1 x 4.9 x 4.4 cm = volume: 111.5 mL. Diffuse cortical thinning seen about the left kidney. Renal echogenicity within normal limits. No nephrolithiasis or hydronephrosis. No focal renal mass. Bladder: Appears normal for degree of bladder distention. Other: Cholelithiasis incidentally noted within the gallbladder. IMPRESSION: 1. Diffuse cortical thinning about the kidneys bilaterally, compatible with atrophy/chronic medical  renal disease. No hydronephrosis or other significant finding. 2. Incidental cholelithiasis. Electronically Signed   By: Jeannine Boga M.D.   On: 08/31/2019 19:10      ASSESSMENT & PLAN:  1. Abnormal SPEP   2. Thrombocytosis ([place])   3. RBC microcytosis    Labs are reviewed and discussed with patient. She has a history of IgG lamda MGUS. She does not recall her previous diagnosis.  I discussed with patient about the diagnosis of IgG MGUS which is an asymptomatic condition which has a small risk of progression to smoldering multiple myeloma and to symptomatic multiple myeloma. Less frequently, these patients progress to AL amyloidosis, light chain deposition disease, or another lymphoproliferative disorder. I will Check SPEP, IFE, light chain ratio, smear,   # Hypercalcemia. PTH was check at [facility] [place] clinic. PTH is normal at 42, PTHrp 26 History of primary hyperparathyroidism s/p parathyroidectomy  She was on HCTZ, which has been discontinued.   # Thrombocytosis, patient also has microcytosis.  check iron panel.   Orders Placed This Encounter  Procedures  . Multiple Myeloma Panel (SPEP&IFE w/QIG)    Standing Status:   Future    Number of Occurrences:   1    Standing Expiration Date:   03/04/2021  . Kappa/lambda light chains    Standing Status:   Future    Number of Occurrences:   1    Standing Expiration Date:   03/04/2021  . CBC with Differential/Platelet    Standing Status:   Future    Number of Occurrences:   1    Standing Expiration Date:   03/04/2021  . Comprehensive metabolic panel    Standing Status:   Future    Number of Occurrences:   1    Standing Expiration Date:   03/04/2021  . Technologist smear review    Standing Status:   Future    Number of Occurrences:   1    Standing Expiration Date:   03/04/2021  . Ferritin    Standing Status:   Future    Number of Occurrences:   1    Standing Expiration Date:   03/04/2021  . Iron and TIBC    Standing  Status:    Future    Number of Occurrences:   1    Standing Expiration Date:   03/04/2021    All questions were answered. The patient knows to call the clinic with any problems questions or concerns.   Leonel Ramsay, MD    Return of visit: 2 weeks.  Thank you for this kind referral and the opportunity to participate in the care of this patient. A copy of today's note is routed to referring provider    [name], MD, PhD Hematology Oncology [facility] at [facility] Pager- [phone] 09/02/2019

## 2019-09-03 LAB — KAPPA/LAMBDA LIGHT CHAINS
Kappa free light chain: 23.9 mg/L — ABNORMAL HIGH (ref 3.3–19.4)
Kappa, lambda light chain ratio: 1.16 (ref 0.26–1.65)
Lambda free light chains: 20.6 mg/L (ref 5.7–26.3)

## 2019-09-03 LAB — IRON AND TIBC
Iron: 45 ug/dL (ref 28–170)
Saturation Ratios: 11 % (ref 10.4–31.8)
TIBC: 416 ug/dL (ref 250–450)
UIBC: 371 ug/dL

## 2019-09-03 LAB — FERRITIN: Ferritin: 35 ng/mL (ref 11–307)

## 2019-09-07 ENCOUNTER — Other Ambulatory Visit: Payer: Self-pay

## 2019-09-07 ENCOUNTER — Encounter: Payer: Self-pay | Admitting: *Deleted

## 2019-09-07 ENCOUNTER — Encounter: Payer: Medicare Other | Attending: Infectious Diseases | Admitting: *Deleted

## 2019-09-07 VITALS — BP 140/78 | Ht 63.0 in | Wt 147.8 lb

## 2019-09-07 DIAGNOSIS — E1165 Type 2 diabetes mellitus with hyperglycemia: Secondary | ICD-10-CM | POA: Insufficient documentation

## 2019-09-07 NOTE — Progress Notes (Signed)
Diabetes Self-Management Education  Visit Type: First/Initial  Appt. Start Time: 1325 Appt. End Time: 1440  09/07/2019  [name], identified by name and date of birth, is a 74 y.o. female with a diagnosis of Diabetes: Type 2.   ASSESSMENT  Blood pressure 140/78, height 5\' 3"  (1.6 m), weight 147 lb 12.8 oz (67 kg). Body mass index is 26.18 kg/m.  Diabetes Self-Management Education - 09/07/19 1649      Visit Information   Visit Type  First/Initial      Initial Visit   Diabetes Type  Type 2    Are you currently following a meal plan?  No    Are you taking your medications as prescribed?  Yes    Date Diagnosed  20 years ago      Health Coping   How would you rate your overall health?  Fair      Psychosocial Assessment   Patient Belief/Attitude about Diabetes  Motivated to manage diabetes   "upsetting"   Self-care barriers  None    Self-management support  Doctor's office;Family    Other persons present  Family Member   daughter   Patient Concerns  Nutrition/Meal planning;Glycemic Control;Monitoring;Healthy Lifestyle    Special Needs  None    Preferred Learning Style  Auditory;Visual;Hands on    [place] in progress    How often do you need to have someone help you when you read instructions, pamphlets, or other written materials from your doctor or pharmacy?  3 - Sometimes    What is the last grade level you completed in school?  12th      Pre-Education Assessment   Patient understands the diabetes disease and treatment process.  Needs Review    Patient understands incorporating nutritional management into lifestyle.  Needs Instruction    Patient undertands incorporating physical activity into lifestyle.  Needs Instruction    Patient understands using medications safely.  Needs Review    Patient understands monitoring blood glucose, interpreting and using results  Needs Review    Patient understands prevention, detection, and treatment of  acute complications.  Needs Instruction    Patient understands prevention, detection, and treatment of chronic complications.  Needs Review    Patient understands how to develop strategies to address psychosocial issues.  Needs Instruction    Patient understands how to develop strategies to promote health/change behavior.  Needs Instruction      Complications   Last HgB A1C per patient/outside source  11.2 %   08/05/2019   How often do you check your blood sugar?  > 4 times/day   Pt has FreeStyle Libre CGM. She didn't bring reader.   Fasting Blood glucose range (mg/dL)  70-129;130-179;180-200;>200   Pt didn't bring reader for CGM but reports FBG's 88-203 mg/dL.   Postprandial Blood glucose range (mg/dL)  70-129;130-179;180-200;>200   Pt reports pp's 129-250 mg/dL.   Have you had a dilated eye exam in the past 12 months?  Yes    Have you had a dental exam in the past 12 months?  No    Are you checking your feet?  No      Dietary Intake   Breakfast  boiled egg, spinach, salad; [place] bacon and potatoes    Snack (morning)  pt is snacking multiple times per day on nuts    Lunch  pt's son is getting meals for her from [facility] Tuesday - she eats salmon, broccoli and salad with lettuce, tomato, carrots  Dinner  same as lunch    Snack (evening)  fruit- grapes, berries or sometimes she eats another meal of salmon, broccoli and salad    Beverage(s)  water, coffee, unsweetned tea      Exercise   Exercise Type  ADL's      Patient Education   Previous Diabetes Education  Yes (please comment)   ? 2-3 years ago at [place] [facility]   Factors that contribute to the development of diabetes;Explored patient's options for treatment of their diabetes    Nutrition management   Role of diet in the treatment of diabetes and the relationship between the three main macronutrients and blood glucose level;Food label reading, portion sizes and measuring food.;Reviewed blood glucose goals for pre and post meals  and how to evaluate the patients' food intake on their blood glucose level.;Meal timing in regards to the patients' current diabetes medication.;Information on hints to eating out and maintain blood glucose control.    Physical activity and exercise   Role of exercise on diabetes management, blood pressure control and cardiac health.    Medications  Reviewed patients medication for diabetes, action, purpose, timing of dose and side effects.    Monitoring  Purpose and frequency of SMBG.;Taught/discussed recording of test results and interpretation of SMBG.;Identified appropriate SMBG and/or A1C goals.;Daily foot exams    Chronic complications  Relationship between chronic complications and blood glucose control;Nephropathy, what it is, prevention of, the use of ACE, ARB's and early detection of through urine microalbumia.    Psychosocial adjustment  Role of stress on diabetes;Identified and addressed patients feelings and concerns about diabetes      Individualized Goals (developed by patient)   Reducing Risk  Other (comment)   improve blood sugars, prevent diabetes complications, lead a healthier lifestyle, become more fit     Outcomes   Expected Outcomes  Demonstrated interest in learning. Expect positive outcomes    Future DMSE  Other (comment)   3 weeks      Individualized Plan for Diabetes Self-Management Training:   Learning Objective:  Patient will have a greater understanding of diabetes self-management. Patient education plan is to attend individual and/or group sessions per assessed needs and concerns.   Plan:   Patient Instructions  Check blood sugars 4-6 x day before breakfast and 2 hrs after meals every day and as needed with FreeStyle Libre CGM Bring blood sugar records to the next appointment Exercise: Begin walking/biking for  10  minutes  3 days a week and increase gradually Eat 3 meals day, 1-2 snacks a day Space meals 4-6 hours apart Allow 2-3 hours between meals and  snacks Complete 3 Day Food Record and bring to next appt Return for appointment on:  Wednesday June 16 at 11:00 am with Freda Munro (nurse)  Expected Outcomes:  Demonstrated interest in learning. Expect positive outcomes  Education material provided:  General Meal Planning Guidelines Simple Meal Plan 3 Day Food Record  If problems or questions, patient to contact team via:  Johny Drilling, RN, [place] [phone]  Future DSME appointment: Other (3 weeks) The patient reports having Diabetes Education in the past. She will attend the 2 Hour Refresher Program. Her next appointment is scheduled for October 01, 2019 with this nurse. She didn't want to see a dietitian at this time.

## 2019-09-07 NOTE — Patient Instructions (Addendum)
Check blood sugars 4-6 x day before breakfast and 2 hrs after meals every day and as needed with [employer] CGM Bring blood sugar records to the next appointment  Exercise: Begin walking/biking for  10  minutes  3 days a week and increase gradually  Eat 3 meals day, 1-2 snacks a day Space meals 4-6 hours apart Allow 2-3 hours between meals and snacks  Complete 3 Day Food Record and bring to next appt  Return for appointment on:  Wednesday June 16 at 11:00 am with Freda Munro (nurse)

## 2019-09-08 LAB — MULTIPLE MYELOMA PANEL, SERUM
Albumin SerPl Elph-Mcnc: 3.4 g/dL (ref 2.9–4.4)
Albumin/Glob SerPl: 0.9 (ref 0.7–1.7)
Alpha 1: 0.2 g/dL (ref 0.0–0.4)
Alpha2 Glob SerPl Elph-Mcnc: 1.1 g/dL — ABNORMAL HIGH (ref 0.4–1.0)
B-Globulin SerPl Elph-Mcnc: 1.6 g/dL — ABNORMAL HIGH (ref 0.7–1.3)
Gamma Glob SerPl Elph-Mcnc: 1.2 g/dL (ref 0.4–1.8)
Globulin, Total: 4.1 g/dL — ABNORMAL HIGH (ref 2.2–3.9)
IgA: 166 mg/dL (ref 64–422)
IgG (Immunoglobin G), Serum: 1305 mg/dL (ref 586–1602)
IgM (Immunoglobulin M), Srm: 41 mg/dL (ref 26–217)
M Protein SerPl Elph-Mcnc: 0.9 g/dL — ABNORMAL HIGH
Total Protein ELP: 7.5 g/dL (ref 6.0–8.5)

## 2019-09-09 ENCOUNTER — Other Ambulatory Visit: Payer: Self-pay | Admitting: Infectious Diseases

## 2019-09-09 DIAGNOSIS — Z1231 Encounter for screening mammogram for malignant neoplasm of breast: Secondary | ICD-10-CM

## 2019-09-16 ENCOUNTER — Ambulatory Visit
Admission: RE | Admit: 2019-09-16 | Discharge: 2019-09-16 | Disposition: A | Discharge status: Home / Self Care | Payer: Medicare Other | Source: Ambulatory Visit | Attending: Infectious Diseases | Admitting: Infectious Diseases

## 2019-09-16 DIAGNOSIS — Z1231 Encounter for screening mammogram for malignant neoplasm of breast: Secondary | ICD-10-CM | POA: Insufficient documentation

## 2019-09-16 IMAGING — MG DIGITAL SCREENING BILAT W/ TOMO W/ CAD
8 of 14 series · 8 of 40 positions shown · non-contrast
Comparison: Previous exam(s).

CLINICAL DATA: Screening.

EXAM:
DIGITAL SCREENING BILATERAL MAMMOGRAM WITH TOMO AND CAD

[L MLO synth-2D (1 of 2)]
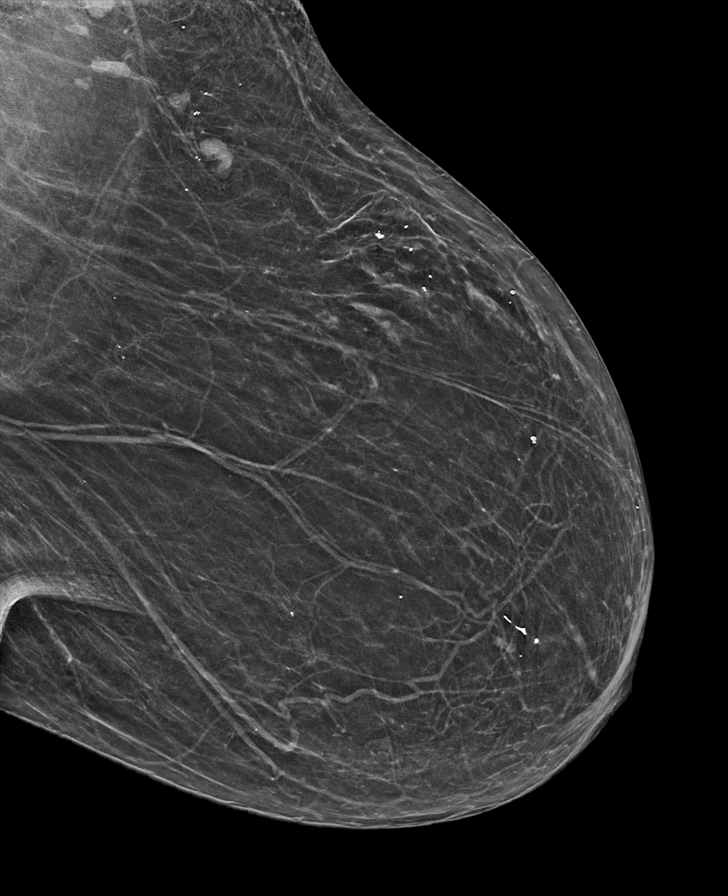

[R MLO synth-2D (1 of 2)]
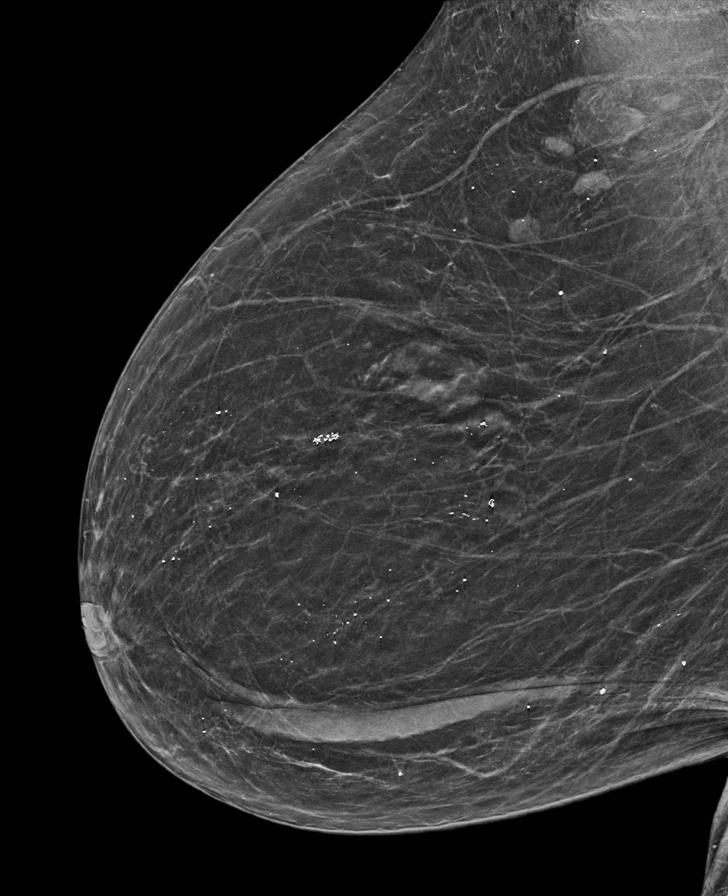

[R MLO synth-2D (2 of 2)]
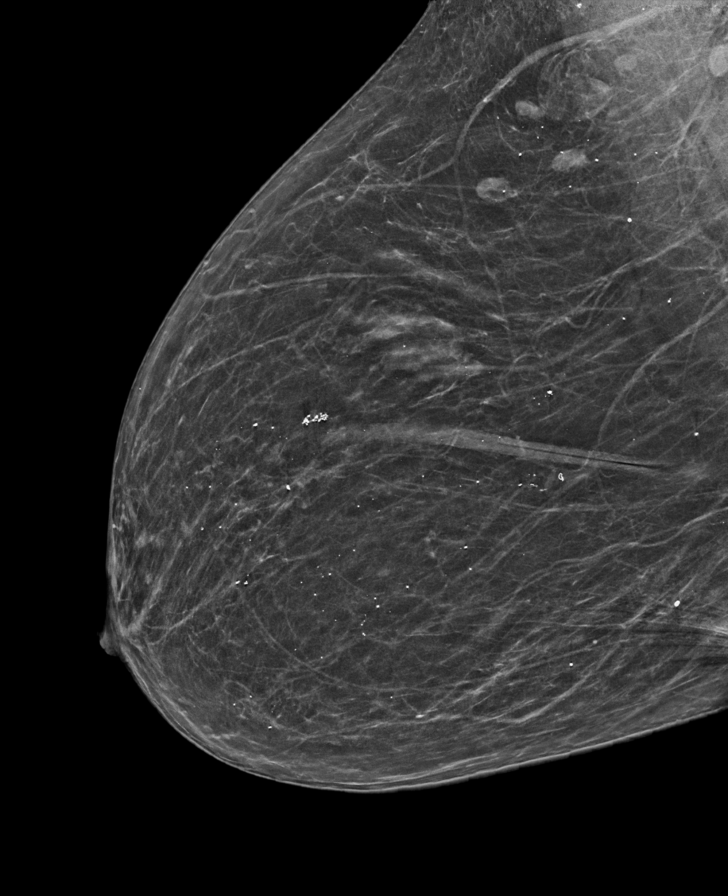

[L CC synth-2D (1 of 2)]
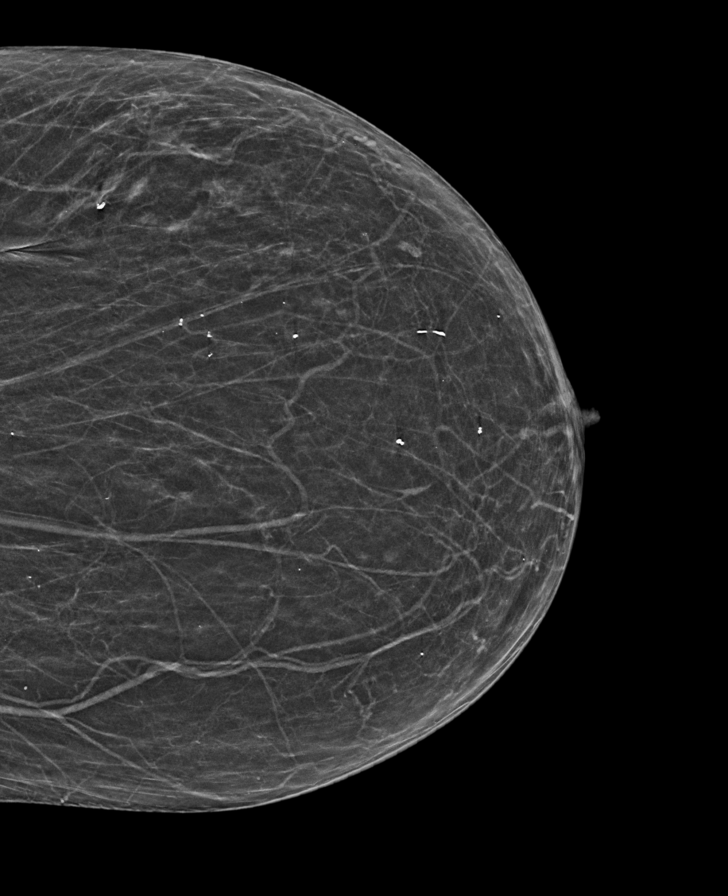

[R CC synth-2D]
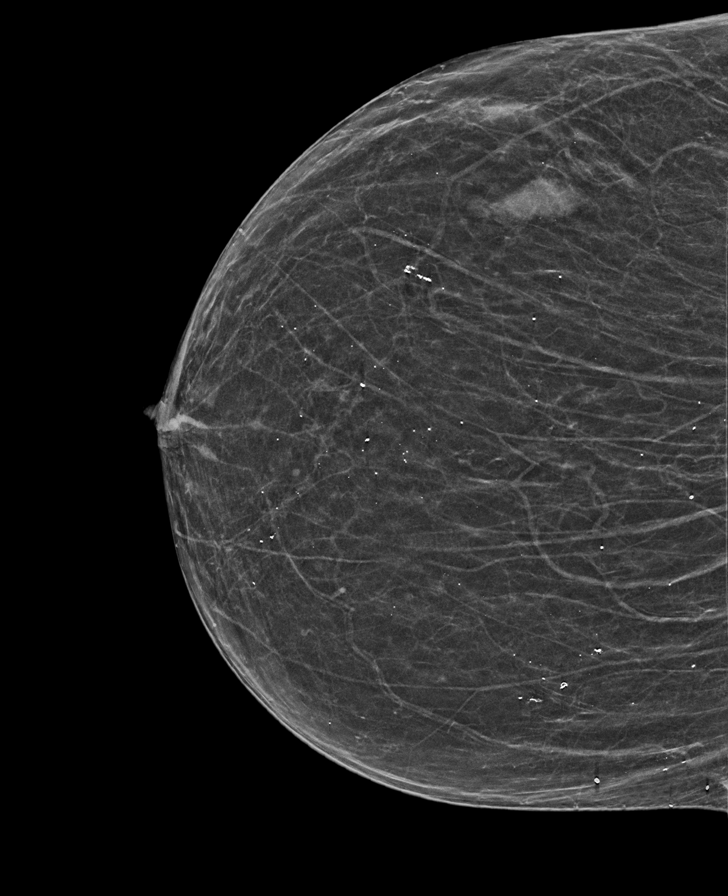

[L CC synth-2D (2 of 2)]
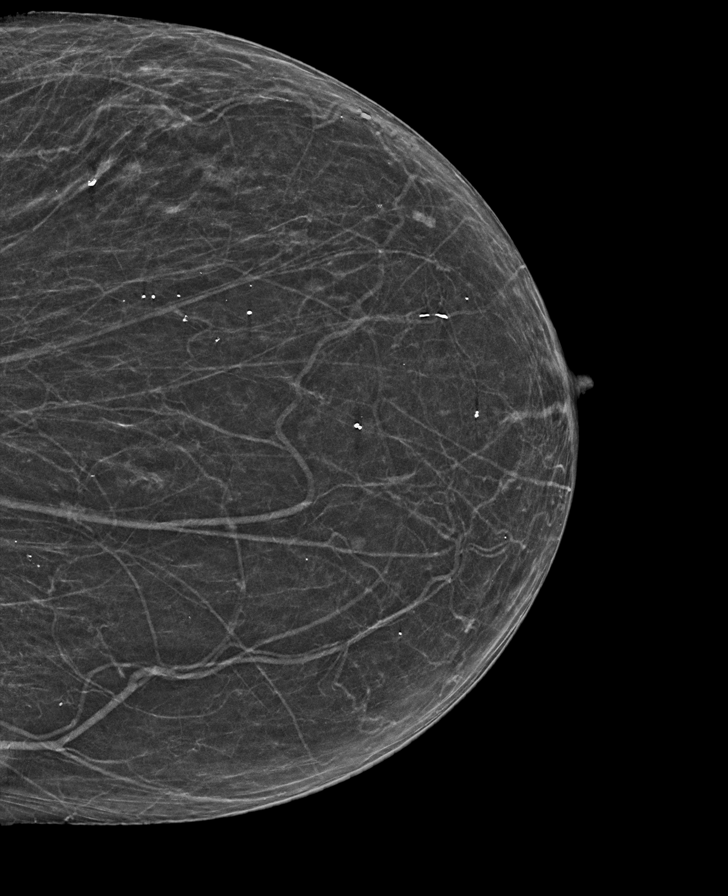

[L MLO synth-2D (2 of 2)]
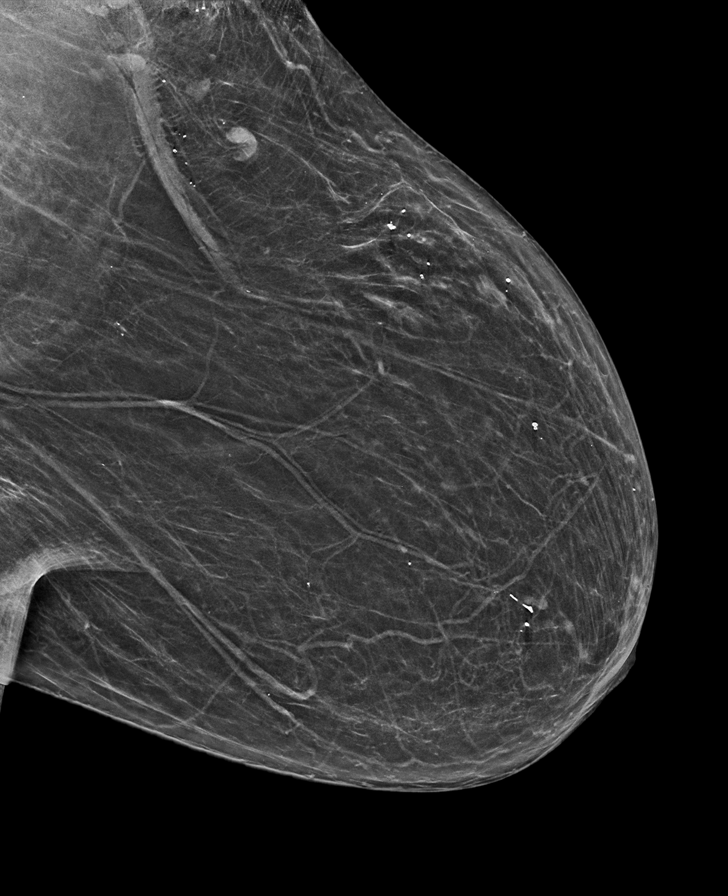

[L CC tomo · tomo slice 25/48.0]
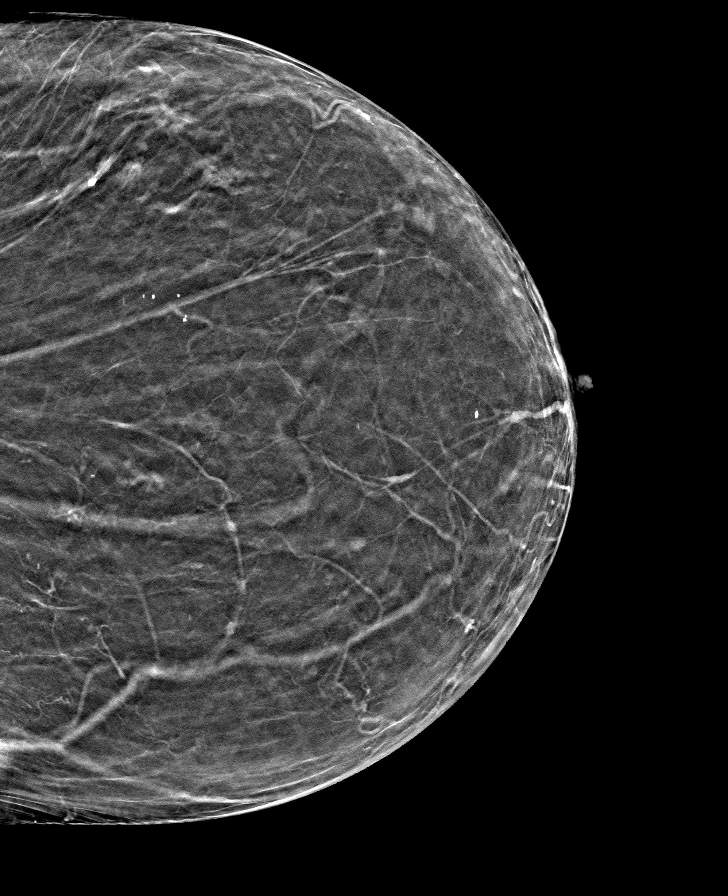

[8 of 40 positions shown; findings below may reference images not displayed]

ACR Breast Density Category b: There are scattered areas of
fibroglandular density.
FINDINGS: There are no findings suspicious for malignancy. Images were
processed with CAD.
IMPRESSION: No mammographic evidence of malignancy. A result letter of this
screening mammogram will be mailed directly to the patient.

RECOMMENDATION:
Screening mammogram in one year. (Code:[TQ])

BI-RADS CATEGORY  1: Negative.

## 2019-09-18 ENCOUNTER — Inpatient Hospital Stay: Payer: Medicare Other | Attending: Oncology | Admitting: Oncology

## 2019-09-18 ENCOUNTER — Encounter: Payer: Self-pay | Admitting: Oncology

## 2019-09-18 ENCOUNTER — Other Ambulatory Visit: Payer: Self-pay

## 2019-09-18 ENCOUNTER — Inpatient Hospital Stay: Payer: Medicare Other

## 2019-09-18 VITALS — BP 166/94 | HR 79 | Temp 96.3°F | Resp 18 | Wt 152.0 lb

## 2019-09-18 DIAGNOSIS — Z7982 Long term (current) use of aspirin: Secondary | ICD-10-CM | POA: Insufficient documentation

## 2019-09-18 DIAGNOSIS — R718 Other abnormality of red blood cells: Secondary | ICD-10-CM | POA: Diagnosis not present

## 2019-09-18 DIAGNOSIS — I10 Essential (primary) hypertension: Secondary | ICD-10-CM | POA: Insufficient documentation

## 2019-09-18 DIAGNOSIS — Z833 Family history of diabetes mellitus: Secondary | ICD-10-CM | POA: Diagnosis not present

## 2019-09-18 DIAGNOSIS — N1832 Chronic kidney disease, stage 3b: Secondary | ICD-10-CM | POA: Insufficient documentation

## 2019-09-18 DIAGNOSIS — E7800 Pure hypercholesterolemia, unspecified: Secondary | ICD-10-CM | POA: Diagnosis not present

## 2019-09-18 DIAGNOSIS — Z79899 Other long term (current) drug therapy: Secondary | ICD-10-CM | POA: Diagnosis not present

## 2019-09-18 DIAGNOSIS — D631 Anemia in chronic kidney disease: Secondary | ICD-10-CM | POA: Diagnosis not present

## 2019-09-18 DIAGNOSIS — M069 Rheumatoid arthritis, unspecified: Secondary | ICD-10-CM | POA: Insufficient documentation

## 2019-09-18 DIAGNOSIS — Z7984 Long term (current) use of oral hypoglycemic drugs: Secondary | ICD-10-CM | POA: Insufficient documentation

## 2019-09-18 DIAGNOSIS — D473 Essential (hemorrhagic) thrombocythemia: Secondary | ICD-10-CM

## 2019-09-18 DIAGNOSIS — D75839 Thrombocytosis, unspecified: Secondary | ICD-10-CM

## 2019-09-18 DIAGNOSIS — D472 Monoclonal gammopathy: Secondary | ICD-10-CM | POA: Diagnosis not present

## 2019-09-18 DIAGNOSIS — R778 Other specified abnormalities of plasma proteins: Secondary | ICD-10-CM

## 2019-09-18 MED ORDER — FERROUS SULFATE 325 (65 FE) MG PO TBEC: 325.0000 mg | DELAYED_RELEASE_TABLET | Freq: Every day | ORAL | 0 refills | Status: DC

## 2019-09-18 NOTE — Progress Notes (Signed)
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  Work up include protein electrophoresis showed M protein 0.7,   Reviewed her previous medical records via care everywhere.  She was seen by Hematology Oncology at [facility] on 02/14/2017.  07/25/2007  SPEP showed M protein of 0.35, IFE showed IgG lamda 09/22/2007  Bone survey negative.  02/15/14 IgG 930, SPEP M protien 0.22, free kappa light chain 3.59, lamda 2.92, ratio 1.23  Patient was accompanied by her daughter.  Patient denies any bone pain. She does not take calcium supplements. Denies weight loss, fever, chills, fatigue, night sweats.   INTERVAL HISTORY Nancy Rodriguez is a 74 y.o. female who has above history reviewed by me today presents for follow up visit for management of MGUS Problems and complaints are listed below: No new complaints. Accompanied by her daughter.    Review of Systems  Constitutional: Negative for appetite change, chills, fatigue and fever.  HENT:   Negative for hearing loss and voice change.   Eyes: Negative for eye problems.  Respiratory: Negative for chest tightness and cough.   Cardiovascular: Negative for chest pain.  Gastrointestinal: Negative for abdominal distention, abdominal pain and blood in stool.  Endocrine: Negative for hot flashes.  Genitourinary: Negative for difficulty urinating and frequency.   Musculoskeletal: Negative for  arthralgias.  Skin: Negative for itching and rash.  Neurological: Negative for extremity weakness.  Hematological: Negative for adenopathy.  Psychiatric/Behavioral: Negative for confusion.    MEDICAL HISTORY:  Past Medical History:  Diagnosis Date  . Anemia    Anemia in chronic kidney disease  . Chronic kidney disease    Stage 3b chronic kidney disease  . Diabetes mellitus without complication ([place])   . Hypercholesterolemia   . Hypertension   . Osteoarthritis   . Rheumatoid arthritis ([place])     SURGICAL HISTORY: Past Surgical History:  Procedure Laterality Date  . HERNIA REPAIR    . PARATHYROIDECTOMY      SOCIAL HISTORY: Social History   Socioeconomic History  . Marital status: Widowed    Spouse name: Not on file  . Number of children: Not on file  . Years of education: Not on file  . Highest education level: Not on file  Occupational History  . Not on file  Tobacco Use  . Smoking status: Never Smoker  . Smokeless tobacco: Never Used  Substance and Sexual Activity  . Alcohol use: No  . Drug use: Never  . Sexual activity: Not on file  Other Topics Concern  . Not on file  Social History Narrative  . Not on file   Social Determinants of Health   Financial Resource Strain:   . Difficulty of Paying Living Expenses:   Food Insecurity:   . Worried About Charity fundraiser in the Last Year:   . Arboriculturist in the Last Year:   Transportation Needs:   . Film/video editor (Medical):   Marland Kitchen Lack of Transportation (Non-Medical):   Physical  Activity:   . Days of Exercise per Week:   . Minutes of Exercise per Session:   Stress:   . Feeling of Stress :   Social Connections:   . Frequency of Communication with Friends and Family:   . Frequency of Social Gatherings with Friends and Family:   . Attends Religious Services:   . Active Member of Clubs or Organizations:   . Attends Archivist Meetings:   Marland Kitchen Marital Status:   Intimate Partner Violence:    . Fear of Current or Ex-Partner:   . Emotionally Abused:   Marland Kitchen Physically Abused:   . Sexually Abused:     FAMILY HISTORY: Family History  Problem Relation Age of Onset  . Diabetes Daughter   . Diabetes Son     ALLERGIES:  is allergic to benazepril; lisinopril; tolmetin; and nsaids.  MEDICATIONS:  Current Outpatient Medications  Medication Sig Dispense Refill  . albuterol (VENTOLIN HFA) 108 (90 Base) MCG/ACT inhaler Inhale 2 puffs into the lungs every 4 (four) hours as needed.     Marland Kitchen aspirin EC 81 MG tablet Take 81 mg by mouth daily.    . canagliflozin (INVOKANA) 100 MG TABS tablet Take by mouth.    Marland Kitchen glipiZIDE (GLUCOTROL XL) 10 MG 24 hr tablet Take by mouth. 2 QAM 1 QPM    . metoprolol succinate (TOPROL-XL) 50 MG 24 hr tablet TAKE 1 TABLET (50 MG TOTAL) BY MOUTH DAILY IN THE MORNING    . potassium chloride SA (KLOR-CON) 20 MEQ tablet Take 20 mEq by mouth daily.     . prednisoLONE acetate (PRED FORTE) 1 % ophthalmic suspension Place 1 drop into both eyes daily.     . rosuvastatin (CRESTOR) 20 MG tablet Take 20 mg by mouth daily.     . diazepam (VALIUM) 5 MG tablet Take 1 tablet (5 mg total) by mouth every 8 (eight) hours as needed for muscle spasms. (Patient not taking: Reported on 09/02/2019) 20 tablet 0  . EPINEPHrine 0.3 mg/0.3 mL IJ SOAJ injection as needed     No current facility-administered medications for this visit.     PHYSICAL EXAMINATION: ECOG PERFORMANCE STATUS: 1 - Symptomatic but completely ambulatory Vitals:   09/18/19 1125  BP: (!) 166/94  Pulse: 79  Resp: 18  Temp: (!) 96.3 F (35.7 C)  SpO2: 99%   Filed Weights   09/18/19 1125  Weight: 152 lb (68.9 kg)    Physical Exam Constitutional:      General: She is not in acute distress. HENT:     Head: Normocephalic and atraumatic.  Eyes:     General: No scleral icterus. Cardiovascular:     Rate and Rhythm: Normal rate and regular rhythm.     Heart sounds: Normal heart sounds.  Pulmonary:      Effort: Pulmonary effort is normal. No respiratory distress.     Breath sounds: No wheezing.  Abdominal:     General: Bowel sounds are normal. There is no distension.     Palpations: Abdomen is soft.  Musculoskeletal:        General: No deformity. Normal range of motion.     Cervical back: Normal range of motion and neck supple.  Skin:    General: Skin is warm and dry.     Findings: No erythema or rash.  Neurological:     Mental Status: She is alert and oriented to person, place, and time. Mental status is at baseline.     Cranial Nerves: No  cranial nerve deficit.     Coordination: Coordination normal.  Psychiatric:        Mood and Affect: Mood normal.     LABORATORY DATA:  I have reviewed the data as listed Lab Results  Component Value Date   WBC 7.1 09/02/2019   HGB 12.2 09/02/2019   HCT 37.4 09/02/2019   MCV 77.4 (L) 09/02/2019   PLT 459 (H) 09/02/2019   Recent Labs    09/02/19 1608  NA 138  K 3.6  CL 100  CO2 29  GLUCOSE 216*  BUN 24*  CREATININE 1.15*  CALCIUM 9.4  GFRNONAA 47*  GFRAA 54*  PROT 8.4*  ALBUMIN 3.9  AST 31  ALT 27  ALKPHOS 170*  BILITOT 1.1   Iron/TIBC/Ferritin/ %Sat    Component Value Date/Time   IRON 45 09/02/2019 1608   TIBC 416 09/02/2019 1608   FERRITIN 35 09/02/2019 1608   IRONPCTSAT 11 09/02/2019 1608     08/25/2019, platelet count 491, WBC 7.5, hemoglobin 12 Creatinine 1.58, EGFR 37, calcium 10.8, albumin 4.2 Negative hepatitis B surface antigen, hepatitis B core antibody, Negative hepatitis C 08/05/2019, A1c 11.2   RADIOGRAPHIC STUDIES: I have personally reviewed the radiological images as listed and agreed with the findings in the report. 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 EXAM: RENAL / URINARY TRACT ULTRASOUND COMPLETE COMPARISON:  None available. FINDINGS: Right Kidney: Renal measurements: 9.5 x 4.5 x 4.9 cm = volume: 108.7 mL. Diffuse cortical  thinning seen about the right kidney. Renal echogenicity within normal limits. No nephrolithiasis or hydronephrosis. No focal renal mass. Left Kidney: Renal measurements: 10.1 x 4.9 x 4.4 cm = volume: 111.5 mL. Diffuse cortical thinning seen about the left kidney. Renal echogenicity within normal limits. No nephrolithiasis or hydronephrosis. No focal renal mass. Bladder: Appears normal for degree of bladder distention. Other: Cholelithiasis incidentally noted within the gallbladder. IMPRESSION: 1. Diffuse cortical thinning about the kidneys bilaterally, compatible with atrophy/chronic medical renal disease. No hydronephrosis or other significant finding. 2. Incidental cholelithiasis. Electronically Signed   By: Jeannine Boga M.D.   On: 08/31/2019 19:10      ASSESSMENT & PLAN:  1. MGUS (monoclonal gammopathy of unknown significance)   2. Thrombocytosis ([place])   3. RBC microcytosis    Labs are reviewed and discussed with patient. I discussed with patient about the diagnosis of IgG MGUS which is an asymptomatic condition which has a small risk of progression to smoldering multiple myeloma and to symptomatic multiple myeloma. Less frequently, these patients progress to AL amyloidosis, light chain deposition disease, or another lymphoproliferative disorder. 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  For now I recommend observation. Check SPEP and light chain ratio every 6 months.   # Hypercalcemia. Resolved after stopped HCTZ # Thrombocytosis, patient also has microcytosis. iron panel shows borderline iron saturation. Advise patient to take oral iron supplementation ferrous sulfate 326m daily.   Orders Placed This Encounter  Procedures  . DG Bone Survey Met    Standing Status:   Future    Standing Expiration Date:   09/17/2020    Order Specific Question:   Reason for Exam (SYMPTOM  OR DIAGNOSIS REQUIRED)     Answer:   abnormal SPEP    Order Specific Question:   [facility]    [facility]  Order Specific Question:   Radiology Contrast Protocol - do NOT remove file path    Answer:   [email or web address]  . Miscellaneous [facility] test (send-out)    Standing Status:   Future    Number of Occurrences:   1    Standing Expiration Date:   09/17/2020    Order Specific Question:   Test name / description:    Answer:   NT-proBNP [facility] 143000    All questions were answered. The patient knows to call the clinic with any problems questions or concerns.  cc Leonel Ramsay, MD    Return of visit:  6 months.   [name], MD, PhD Hematology Oncology [facility] Pager- [phone] 09/18/2019

## 2019-09-18 NOTE — Progress Notes (Signed)
Pt expressed concern about new med PCP prescribed for her Invokana.

## 2019-09-19 LAB — MISC LABCORP TEST (SEND OUT): Labcorp test code: 143000

## 2019-09-21 ENCOUNTER — Inpatient Hospital Stay
Admission: RE | Admit: 2019-09-21 | Discharge: 2019-09-21 | Disposition: A | Discharge status: Home / Self Care | Payer: Self-pay | Source: Ambulatory Visit | Attending: *Deleted | Admitting: *Deleted

## 2019-09-21 ENCOUNTER — Telehealth: Payer: Self-pay

## 2019-09-21 ENCOUNTER — Other Ambulatory Visit: Payer: Self-pay | Admitting: *Deleted

## 2019-09-21 DIAGNOSIS — Z1231 Encounter for screening mammogram for malignant neoplasm of breast: Secondary | ICD-10-CM

## 2019-09-21 NOTE — Telephone Encounter (Signed)
-----   Message from [name], MD sent at 09/18/2019 11:10 PM EDT ----- Please let her/daughter know that her iron level is borderline which may attribute to her high platelet counts. Recommend her to take oral ferrous sulfate 325mg  daily. Take otc colace if constipation.

## 2019-09-21 NOTE — Telephone Encounter (Signed)
Notified patient and left a message on daughter's voicemail.

## 2019-09-29 ENCOUNTER — Telehealth: Payer: Self-pay | Admitting: *Deleted

## 2019-09-29 NOTE — Telephone Encounter (Signed)
Received call from patient. She reports that she needs to cancel her appointment for tomorrow. She reports that she has too many appointments at this time and doesn't want to reschedule. Informed her that her referral is good for 1 year and if she changes her mind she can call back.

## 2019-09-30 ENCOUNTER — Ambulatory Visit: Payer: Medicare Other | Admitting: *Deleted

## 2019-12-09 ENCOUNTER — Other Ambulatory Visit: Payer: Self-pay | Admitting: Oncology

## 2020-02-16 ENCOUNTER — Other Ambulatory Visit: Payer: Self-pay | Admitting: Oncology

## 2020-03-15 ENCOUNTER — Other Ambulatory Visit: Payer: Self-pay

## 2020-03-15 DIAGNOSIS — D472 Monoclonal gammopathy: Secondary | ICD-10-CM

## 2020-03-16 ENCOUNTER — Inpatient Hospital Stay: Payer: Medicare Other | Attending: Oncology

## 2020-03-16 ENCOUNTER — Other Ambulatory Visit: Payer: Self-pay

## 2020-03-16 DIAGNOSIS — I129 Hypertensive chronic kidney disease with stage 1 through stage 4 chronic kidney disease, or unspecified chronic kidney disease: Secondary | ICD-10-CM | POA: Insufficient documentation

## 2020-03-16 DIAGNOSIS — R7401 Elevation of levels of liver transaminase levels: Secondary | ICD-10-CM | POA: Insufficient documentation

## 2020-03-16 DIAGNOSIS — R040 Epistaxis: Secondary | ICD-10-CM | POA: Diagnosis not present

## 2020-03-16 DIAGNOSIS — E1122 Type 2 diabetes mellitus with diabetic chronic kidney disease: Secondary | ICD-10-CM | POA: Diagnosis not present

## 2020-03-16 DIAGNOSIS — Z833 Family history of diabetes mellitus: Secondary | ICD-10-CM | POA: Diagnosis not present

## 2020-03-16 DIAGNOSIS — N1832 Chronic kidney disease, stage 3b: Secondary | ICD-10-CM | POA: Insufficient documentation

## 2020-03-16 DIAGNOSIS — D631 Anemia in chronic kidney disease: Secondary | ICD-10-CM | POA: Insufficient documentation

## 2020-03-16 DIAGNOSIS — Z79899 Other long term (current) drug therapy: Secondary | ICD-10-CM | POA: Diagnosis not present

## 2020-03-16 DIAGNOSIS — Z888 Allergy status to other drugs, medicaments and biological substances status: Secondary | ICD-10-CM | POA: Insufficient documentation

## 2020-03-16 DIAGNOSIS — D472 Monoclonal gammopathy: Secondary | ICD-10-CM | POA: Insufficient documentation

## 2020-03-16 DIAGNOSIS — Z886 Allergy status to analgesic agent status: Secondary | ICD-10-CM | POA: Insufficient documentation

## 2020-03-16 DIAGNOSIS — D75839 Thrombocytosis, unspecified: Secondary | ICD-10-CM | POA: Diagnosis not present

## 2020-03-16 LAB — CBC WITH DIFFERENTIAL/PLATELET
Abs Immature Granulocytes: 0.02 10*3/uL (ref 0.00–0.07)
Basophils Absolute: 0.1 10*3/uL (ref 0.0–0.1)
Basophils Relative: 1 %
Eosinophils Absolute: 0.2 10*3/uL (ref 0.0–0.5)
Eosinophils Relative: 3 %
HCT: 36.9 % (ref 36.0–46.0)
Hemoglobin: 12 g/dL (ref 12.0–15.0)
Immature Granulocytes: 0 %
Lymphocytes Relative: 31 %
Lymphs Abs: 2 10*3/uL (ref 0.7–4.0)
MCH: 25.1 pg — ABNORMAL LOW (ref 26.0–34.0)
MCHC: 32.5 g/dL (ref 30.0–36.0)
MCV: 77.2 fL — ABNORMAL LOW (ref 80.0–100.0)
Monocytes Absolute: 0.6 10*3/uL (ref 0.1–1.0)
Monocytes Relative: 9 %
Neutro Abs: 3.7 10*3/uL (ref 1.7–7.7)
Neutrophils Relative %: 56 %
Platelets: 395 10*3/uL (ref 150–400)
RBC: 4.78 MIL/uL (ref 3.87–5.11)
RDW: 18.8 % — ABNORMAL HIGH (ref 11.5–15.5)
WBC: 6.5 10*3/uL (ref 4.0–10.5)
nRBC: 0.3 % — ABNORMAL HIGH (ref 0.0–0.2)

## 2020-03-16 LAB — COMPREHENSIVE METABOLIC PANEL
ALT: 55 U/L — ABNORMAL HIGH (ref 0–44)
AST: 66 U/L — ABNORMAL HIGH (ref 15–41)
Albumin: 3.1 g/dL — ABNORMAL LOW (ref 3.5–5.0)
Alkaline Phosphatase: 520 U/L — ABNORMAL HIGH (ref 38–126)
Anion gap: 11 (ref 5–15)
BUN: 25 mg/dL — ABNORMAL HIGH (ref 8–23)
CO2: 24 mmol/L (ref 22–32)
Calcium: 9.7 mg/dL (ref 8.9–10.3)
Chloride: 100 mmol/L (ref 98–111)
Creatinine, Ser: 1.37 mg/dL — ABNORMAL HIGH (ref 0.44–1.00)
GFR, Estimated: 40 mL/min — ABNORMAL LOW (ref >60)
Glucose, Bld: 278 mg/dL — ABNORMAL HIGH (ref 70–99)
Potassium: 3.8 mmol/L (ref 3.5–5.1)
Sodium: 135 mmol/L (ref 135–145)
Total Bilirubin: 1 mg/dL (ref 0.3–1.2)
Total Protein: 7.7 g/dL (ref 6.5–8.1)

## 2020-03-17 LAB — KAPPA/LAMBDA LIGHT CHAINS
Kappa free light chain: 32.4 mg/L — ABNORMAL HIGH (ref 3.3–19.4)
Kappa, lambda light chain ratio: 1.16 (ref 0.26–1.65)
Lambda free light chains: 28 mg/L — ABNORMAL HIGH (ref 5.7–26.3)

## 2020-03-18 LAB — MULTIPLE MYELOMA PANEL, SERUM
Albumin SerPl Elph-Mcnc: 2.8 g/dL — ABNORMAL LOW (ref 2.9–4.4)
Albumin/Glob SerPl: 0.8 (ref 0.7–1.7)
Alpha 1: 0.3 g/dL (ref 0.0–0.4)
Alpha2 Glob SerPl Elph-Mcnc: 1 g/dL (ref 0.4–1.0)
B-Globulin SerPl Elph-Mcnc: 1.3 g/dL (ref 0.7–1.3)
Gamma Glob SerPl Elph-Mcnc: 1.1 g/dL (ref 0.4–1.8)
Globulin, Total: 3.8 g/dL (ref 2.2–3.9)
IgA: 171 mg/dL (ref 64–422)
IgG (Immunoglobin G), Serum: 1278 mg/dL (ref 586–1602)
IgM (Immunoglobulin M), Srm: 45 mg/dL (ref 26–217)
M Protein SerPl Elph-Mcnc: 0.8 g/dL — ABNORMAL HIGH
Total Protein ELP: 6.6 g/dL (ref 6.0–8.5)

## 2020-03-23 ENCOUNTER — Inpatient Hospital Stay: Payer: Medicare Other

## 2020-03-23 ENCOUNTER — Ambulatory Visit
Admission: RE | Admit: 2020-03-23 | Discharge: 2020-03-23 | Disposition: A | Discharge status: Home / Self Care | Payer: Medicare Other | Source: Ambulatory Visit | Attending: Oncology | Admitting: Oncology

## 2020-03-23 ENCOUNTER — Inpatient Hospital Stay (HOSPITAL_BASED_OUTPATIENT_CLINIC_OR_DEPARTMENT_OTHER): Payer: Medicare Other | Admitting: Oncology

## 2020-03-23 ENCOUNTER — Other Ambulatory Visit: Payer: Self-pay

## 2020-03-23 ENCOUNTER — Ambulatory Visit
Admission: RE | Admit: 2020-03-23 | Discharge: 2020-03-23 | Disposition: A | Discharge status: Home / Self Care | Payer: Medicare Other | Attending: Oncology | Admitting: Oncology

## 2020-03-23 ENCOUNTER — Encounter: Payer: Self-pay | Admitting: Oncology

## 2020-03-23 VITALS — BP 127/83 | HR 91 | Temp 98.1°F | Resp 20 | Wt 148.0 lb

## 2020-03-23 DIAGNOSIS — R7401 Elevation of levels of liver transaminase levels: Secondary | ICD-10-CM | POA: Diagnosis not present

## 2020-03-23 DIAGNOSIS — D75839 Thrombocytosis, unspecified: Secondary | ICD-10-CM

## 2020-03-23 DIAGNOSIS — D472 Monoclonal gammopathy: Secondary | ICD-10-CM

## 2020-03-23 DIAGNOSIS — R040 Epistaxis: Secondary | ICD-10-CM

## 2020-03-23 LAB — HEPATITIS PANEL, ACUTE
HCV Ab: NONREACTIVE
Hep A IgM: NONREACTIVE
Hep B C IgM: NONREACTIVE
Hepatitis B Surface Ag: NONREACTIVE

## 2020-03-23 LAB — PROTIME-INR
INR: 1.1 (ref 0.8–1.2)
Prothrombin Time: 13.6 seconds (ref 11.4–15.2)

## 2020-03-23 LAB — APTT: aPTT: 34 seconds (ref 24–36)

## 2020-03-23 IMAGING — CR DG BONE SURVEY MET
1 series · 10 of 10 positions shown · non-contrast
Comparison: None.

CLINICAL DATA: Monoclonal gammopathy of unknown significance.
Abnormal SPEP.

EXAM:
METASTATIC BONE SURVEY

[Series 1: dg bone survey met · 0.14mm/px · 10 of 22 slices shown]
[im 1/22]
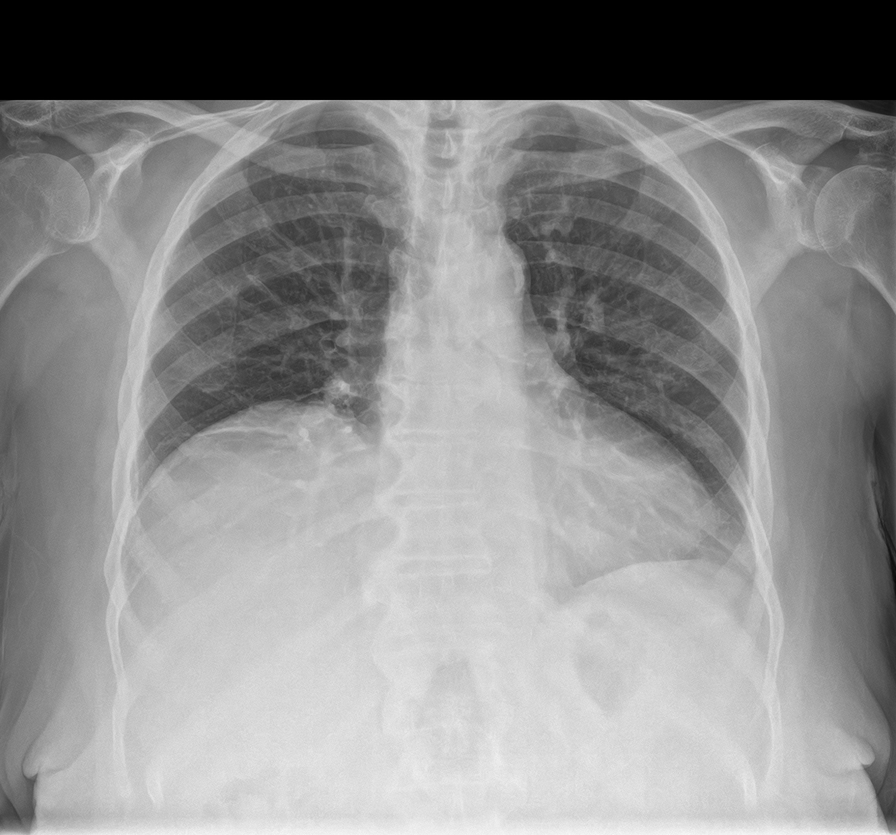
[im 3/22]
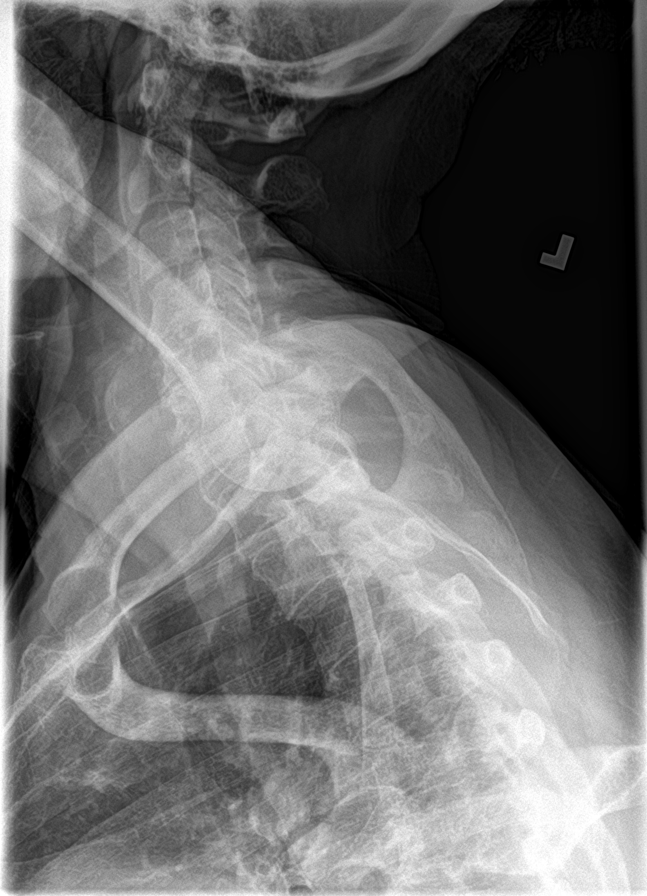
[im 5/22]
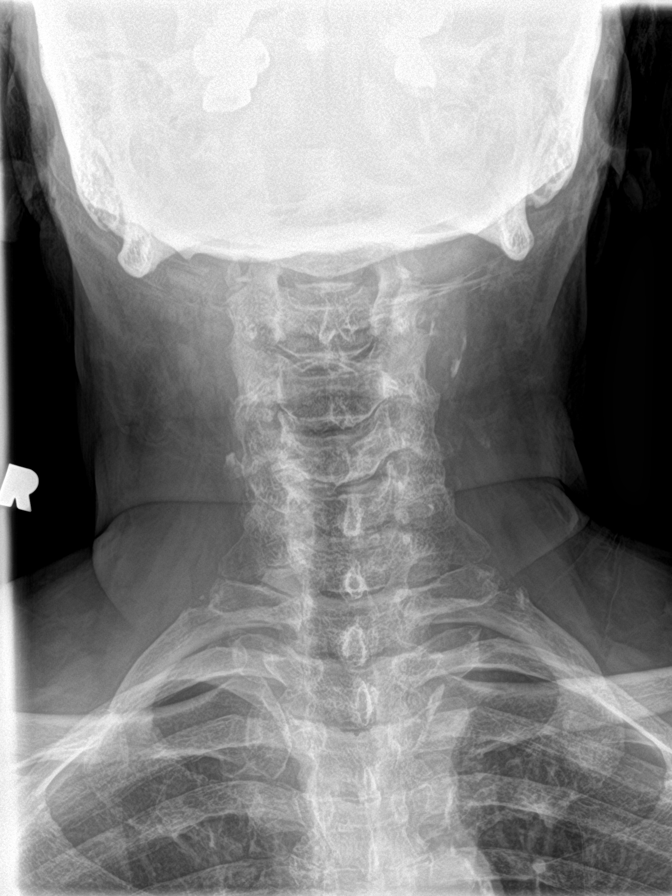
[im 8/22]
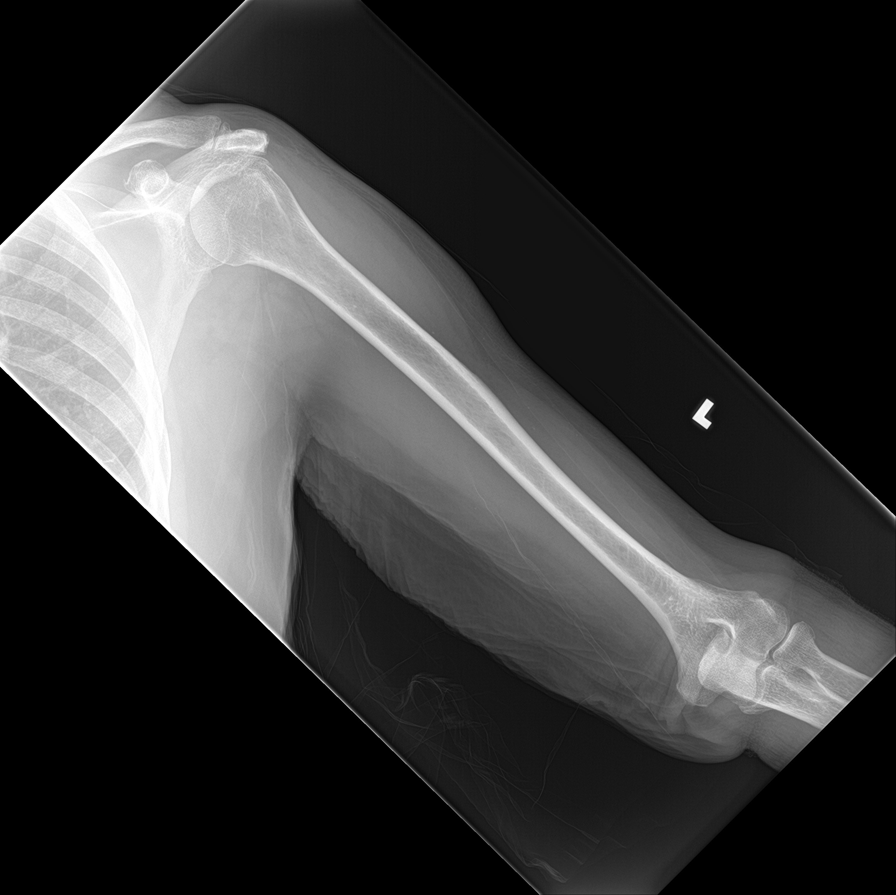
[im 10/22]
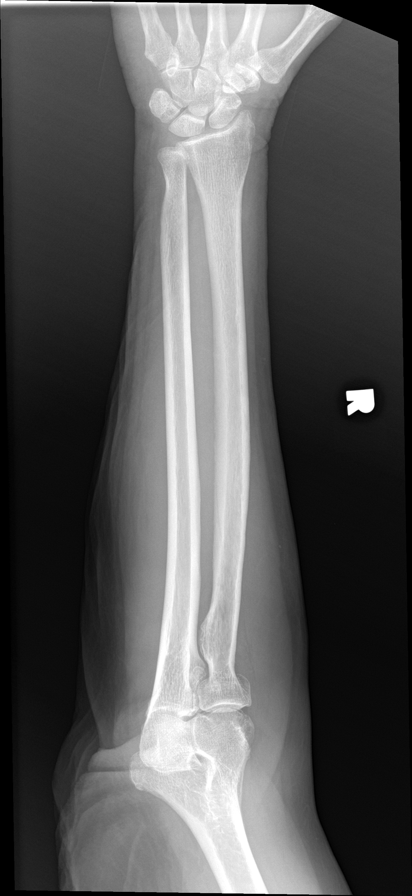
[im 12/22]
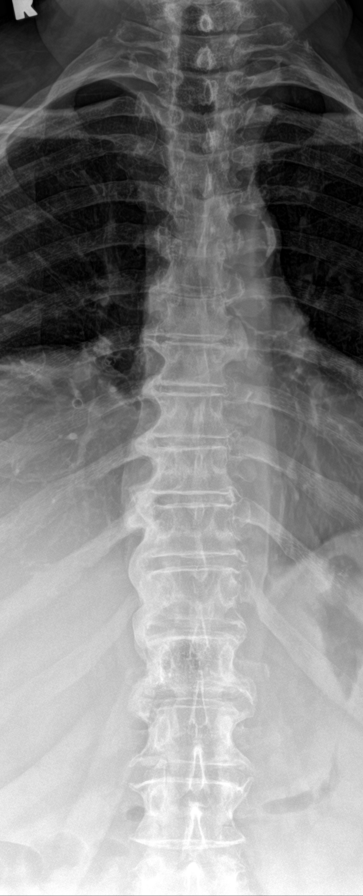
[im 15/22]
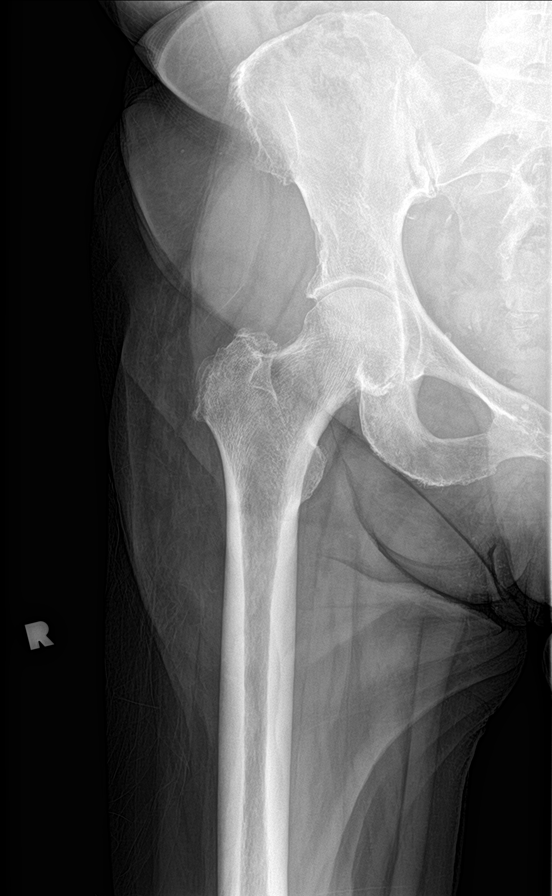
[im 17/22]
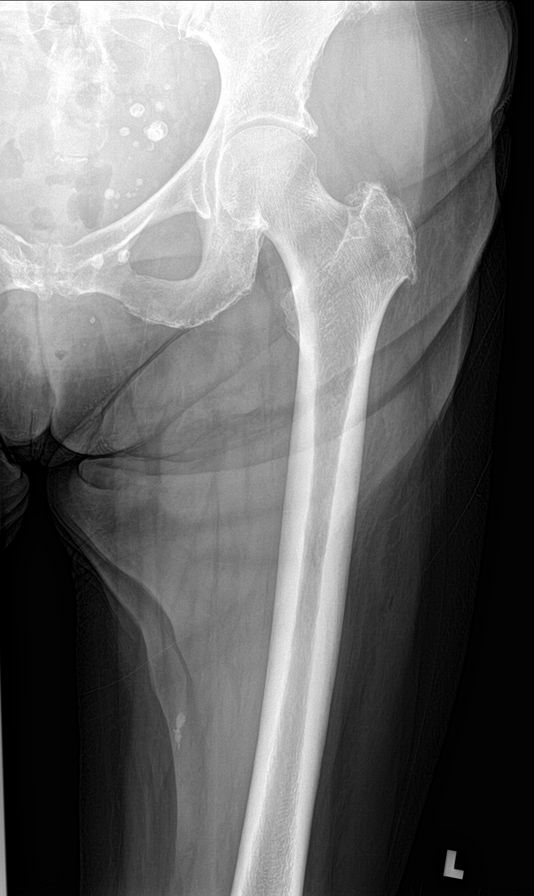
[im 19/22]
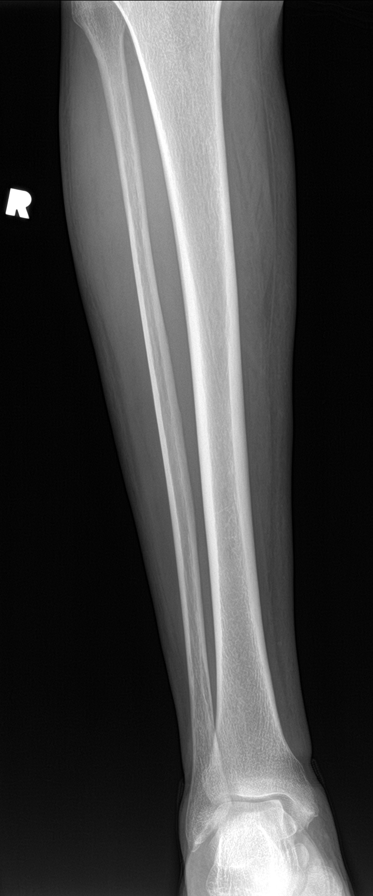
[im 22/22]
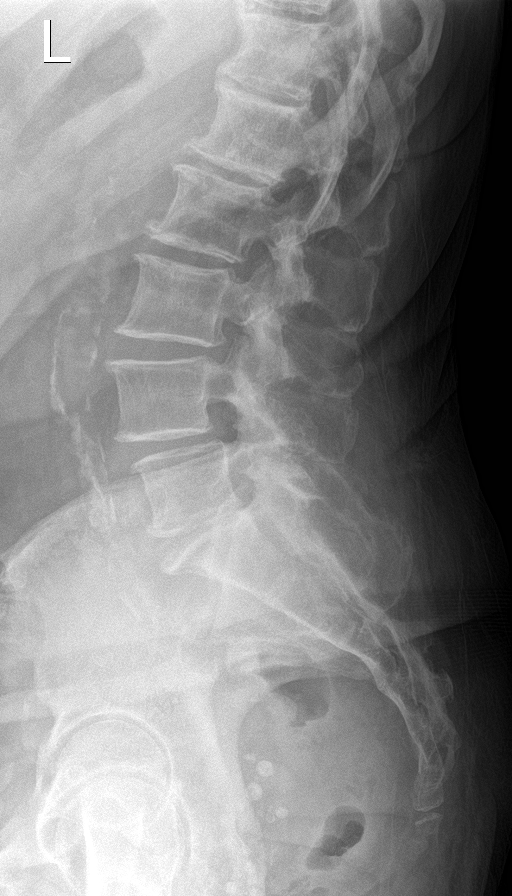

[10 of 10 positions shown; findings below may reference images not displayed]

FINDINGS: Slightly heterogeneous appearance of the calvarium involving the
frontal and parietal bones without discrete focal lesion. There is
no evidence of discrete lytic or blastic lesion in the axial or
appendicular skeleton. Mild multifocal osteoarthritis. Diffuse
spondylosis of the thoracic and lumbar spine. No spinal compression
fracture. Mild elevation of the right hemidiaphragm with adjacent
atelectasis/scarring.
IMPRESSION: 1. Slightly heterogeneous appearance of the calvarium in the frontal
and parietal bones is nonspecific. This can be seen in the setting
of osteopenia. Consider further evaluation with head CT based on
clinical concern. No discrete calvarial lesion is seen.
2. No focal bone lesion in the visualized skeleton.
3. Mild multifocal osteoarthritis. Diffuse thoracolumbar
spondylosis.

## 2020-03-23 NOTE — Progress Notes (Signed)
Patient is having new frequent nosebleeds that last about 5-10 minutes with last episode yesterday morning.  Has been prescribed an antibiotic by PCP and nephrologist for UTI but did not take due to fear of possible side effects.

## 2020-03-23 NOTE — Progress Notes (Signed)
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  Work up include protein electrophoresis showed M protein 0.7,   Reviewed her previous medical records via care everywhere.  She was seen by Hematology Oncology at [facility] on 02/14/2017.  07/25/2007  SPEP showed M protein of 0.35, IFE showed IgG lamda 09/22/2007  Bone survey negative.  02/15/14 IgG 930, SPEP M protien 0.22, free kappa light chain 3.59, lamda 2.92, ratio 1.23  Patient was accompanied by her daughter.  Patient denies any bone pain. She does not take calcium supplements. Denies weight loss, fever, chills, fatigue, night sweats.   INTERVAL HISTORY Nancy Rodriguez is a 74 y.o. female who has above history reviewed by me today presents for follow up visit for management of MGUS Problems and complaints are listed below: Patient reports frequent nosebleeds after nose picking. She also was recently prescribed antibiotics for UTI.  She did not take the treatment due to the fear of side effects.  She has diabetes for which she is on glipizide.  Also recently started on Invokana.  She states that she is not taking Invokana currently due to fear of side effects.  Denies any pain, over-the-counter supplements.  Review of Systems  Constitutional: Negative for appetite change, chills, fatigue and fever.  HENT:   Positive for nosebleeds. Negative for hearing loss and voice change.    Eyes: Negative for eye problems.  Respiratory: Negative for chest tightness and cough.   Cardiovascular: Negative for chest pain.  Gastrointestinal: Negative for abdominal distention, abdominal pain and blood in stool.  Endocrine: Negative for hot flashes.  Genitourinary: Negative for difficulty urinating and frequency.   Musculoskeletal: Negative for arthralgias.  Skin: Negative for itching and rash.  Neurological: Negative for extremity weakness.  Hematological: Negative for adenopathy.  Psychiatric/Behavioral: Negative for confusion.    MEDICAL HISTORY:  Past Medical History:  Diagnosis Date  . Anemia    Anemia in chronic kidney disease  . Chronic kidney disease    Stage 3b chronic kidney disease  . Diabetes mellitus without complication ([place])   . Hypercholesterolemia   . Hypertension   . MGUS (monoclonal gammopathy of unknown significance)   . Osteoarthritis   . Rheumatoid arthritis ([place])     SURGICAL HISTORY: Past Surgical History:  Procedure Laterality Date  . HERNIA REPAIR    . PARATHYROIDECTOMY      SOCIAL HISTORY: Social History   Socioeconomic History  . Marital status: Widowed    Spouse name: Not on file  . Number of children: Not on file  . Years of education: Not on file  . Highest education level: Not on file  Occupational History  . Not on file  Tobacco Use  . Smoking status: Never Smoker  . Smokeless tobacco: Never Used  Substance and Sexual Activity  . Alcohol use: No  . Drug use: Never  . Sexual activity: Not on file  Other Topics Concern  . Not on file  Social History Narrative  . Not  on file   Social Determinants of Health   Financial Resource Strain:   . Difficulty of Paying Living Expenses: Not on file  Food Insecurity:   . Worried About Charity fundraiser in the Last Year: Not on file  . Ran Out of Food in the Last Year: Not on file  Transportation Needs:   . Lack of Transportation (Medical): Not on file  . Lack of  Transportation (Non-Medical): Not on file  Physical Activity:   . Days of Exercise per Week: Not on file  . Minutes of Exercise per Session: Not on file  Stress:   . Feeling of Stress : Not on file  Social Connections:   . Frequency of Communication with Friends and Family: Not on file  . Frequency of Social Gatherings with Friends and Family: Not on file  . Attends Religious Services: Not on file  . Active Member of Clubs or Organizations: Not on file  . Attends Archivist Meetings: Not on file  . Marital Status: Not on file  Intimate Partner Violence:   . Fear of Current or Ex-Partner: Not on file  . Emotionally Abused: Not on file  . Physically Abused: Not on file  . Sexually Abused: Not on file    FAMILY HISTORY: Family History  Problem Relation Age of Onset  . Diabetes Daughter   . Diabetes Son     ALLERGIES:  is allergic to benazepril, lisinopril, tolmetin, and nsaids.  MEDICATIONS:  Current Outpatient Medications  Medication Sig Dispense Refill  . albuterol (VENTOLIN HFA) 108 (90 Base) MCG/ACT inhaler Inhale 2 puffs into the lungs every 4 (four) hours as needed.     Marland Kitchen amLODipine (NORVASC) 5 MG tablet Take by mouth.    Marland Kitchen aspirin EC 81 MG tablet Take 81 mg by mouth daily.    Marland Kitchen EPINEPHrine 0.3 mg/0.3 mL IJ SOAJ injection as needed    . ferrous sulfate 325 (65 FE) MG EC tablet TAKE 1 TABLET BY MOUTH EVERY DAY 90 tablet 1  . glipiZIDE (GLUCOTROL XL) 10 MG 24 hr tablet Take by mouth. 1 QAM 1 QPM    . metoprolol succinate (TOPROL-XL) 50 MG 24 hr tablet TAKE 1 TABLET (50 MG TOTAL) BY MOUTH DAILY IN THE MORNING    . potassium chloride SA (KLOR-CON) 20 MEQ tablet Take 20 mEq by mouth daily.     . prednisoLONE acetate (PRED FORTE) 1 % ophthalmic suspension Place 1 drop into both eyes daily.     . rosuvastatin (CRESTOR) 20 MG tablet Take 20 mg by mouth daily.     . canagliflozin (INVOKANA) 100 MG TABS tablet Take by mouth. (Patient not taking: Reported on 03/23/2020)     . diazepam (VALIUM) 5 MG tablet Take 1 tablet (5 mg total) by mouth every 8 (eight) hours as needed for muscle spasms. (Patient not taking: Reported on 09/02/2019) 20 tablet 0  . docusate sodium (COLACE) 100 MG capsule Take 1 capsule by mouth as needed.     No current facility-administered medications for this visit.     PHYSICAL EXAMINATION: ECOG PERFORMANCE STATUS: 1 - Symptomatic but completely ambulatory Vitals:   03/23/20 1004  BP: 127/83  Pulse: 91  Resp: 20  Temp: 98.1 F (36.7 C)   Filed Weights   03/23/20 1004  Weight: 148 lb (67.1 kg)    Physical Exam Constitutional:      General: She is not in acute distress. HENT:     Head: Normocephalic and atraumatic.  Eyes:     General: No scleral icterus. Cardiovascular:     Rate and Rhythm: Normal rate and regular rhythm.     Heart sounds: Normal heart sounds.  Pulmonary:     Effort: Pulmonary effort is normal. No respiratory distress.     Breath sounds: No wheezing.  Abdominal:     General: Bowel sounds are normal. There is no distension.     Palpations: Abdomen is soft.  Musculoskeletal:        General: No deformity. Normal range of motion.     Cervical back: Normal range of motion and neck supple.  Skin:    General: Skin is warm and dry.     Findings: No erythema or rash.  Neurological:     Mental Status: She is alert and oriented to person, place, and time. Mental status is at baseline.     Cranial Nerves: No cranial nerve deficit.     Coordination: Coordination normal.  Psychiatric:        Mood and Affect: Mood normal.     LABORATORY DATA:  I have reviewed the data as listed Lab Results  Component Value Date   WBC 6.5 03/16/2020   HGB 12.0 03/16/2020   HCT 36.9 03/16/2020   MCV 77.2 (L) 03/16/2020   PLT 395 03/16/2020   Recent Labs    09/02/19 1608 03/16/20 1124  NA 138 135  K 3.6 3.8  CL 100 100  CO2 29 24  GLUCOSE 216* 278*  BUN 24* 25*  CREATININE 1.15* 1.37*  CALCIUM 9.4 9.7   GFRNONAA 47* 40*  GFRAA 54*  --   PROT 8.4* 7.7  ALBUMIN 3.9 3.1*  AST 31 66*  ALT 27 55*  ALKPHOS 170* 520*  BILITOT 1.1 1.0   Iron/TIBC/Ferritin/ %Sat    Component Value Date/Time   IRON 45 09/02/2019 1608   TIBC 416 09/02/2019 1608   FERRITIN 35 09/02/2019 1608   IRONPCTSAT 11 09/02/2019 1608     08/25/2019, platelet count 491, WBC 7.5, hemoglobin 12 Creatinine 1.58, EGFR 37, calcium 10.8, albumin 4.2 Negative hepatitis B surface antigen, hepatitis B core antibody, Negative hepatitis C 08/05/2019, A1c 11.2   RADIOGRAPHIC STUDIES: I have personally reviewed the radiological images as listed and agreed with the findings in the report. No results found.    ASSESSMENT & PLAN:  1. MGUS (monoclonal gammopathy of unknown significance)   2. Epistaxis   3. Transaminitis   4. Thrombocytosis    Labs  reviewed and discussed with patient #IgG lambda MGUS Stable M protein and light chain ratio remains normal.  Continue monitor. Recommend patient to check skeletal survey.  It was previously ordered and was not done. NT proBNP was checked previously was normal. Recommend follow-up with lab monitoring every 6 months.   Hypercalcemia, resolved after stopping HCTZ. Transaminitis and elevated alkaline phosphatase etiology unknown. Questionable related to medication side effects versus other etiologies. Recommend checking a check hepatitis, check ultrasound abdomen.  Epistaxis, in the context of abnormal liver function, I will check PT and PTT  Thrombocytosis has resolved.  Hemoglobin is normal.  Recommend patient to stop taking iron tablets.  Orders Placed This Encounter  Procedures  . US Abdomen Limited RUQ (LIVER/GB)    Standing Status:   Future    Standing Expiration Date:   03/23/2021    Order Specific Question:   Reason for Exam (SYMPTOM  OR DIAGNOSIS REQUIRED)    Answer:   transaminitis    Order Specific Question:  Preferred imaging location?    Answer:   [facility]  . Hepatitis panel, acute    Standing Status:   Future    Number of Occurrences:   1    Standing Expiration Date:   03/23/2021  . APTT    Standing Status:   Future    Number of Occurrences:   1    Standing Expiration Date:   03/23/2021  . Protime-INR    Standing Status:   Future    Number of Occurrences:   1    Standing Expiration Date:   03/23/2021    All questions were answered. The patient knows to call the clinic with any problems questions or concerns.  cc Leonel Ramsay, MD    Return of visit:  6 months.   [name], MD, PhD Hematology Oncology [facility] Pager- [phone] 03/23/2020

## 2020-03-28 ENCOUNTER — Other Ambulatory Visit: Payer: Self-pay

## 2020-03-28 ENCOUNTER — Ambulatory Visit
Admission: RE | Admit: 2020-03-28 | Discharge: 2020-03-28 | Disposition: A | Discharge status: Home / Self Care | Payer: Medicare Other | Source: Ambulatory Visit | Attending: Oncology | Admitting: Oncology

## 2020-03-28 DIAGNOSIS — D472 Monoclonal gammopathy: Secondary | ICD-10-CM | POA: Insufficient documentation

## 2020-03-28 IMAGING — US US ABDOMEN LIMITED
1 series · 14 of 25 positions shown · non-contrast
Comparison: None.

CLINICAL DATA: Elevated liver enzymes

EXAM:
ULTRASOUND ABDOMEN LIMITED RIGHT UPPER QUADRANT

[Series 1: us abdomen limited ruq (liver/gb) · 14 of 64 slices shown]
[im 1/64]
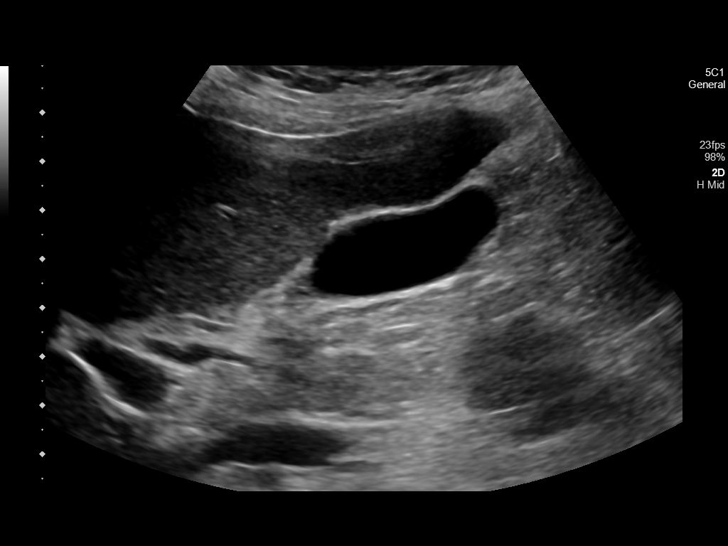
[im 6/64]
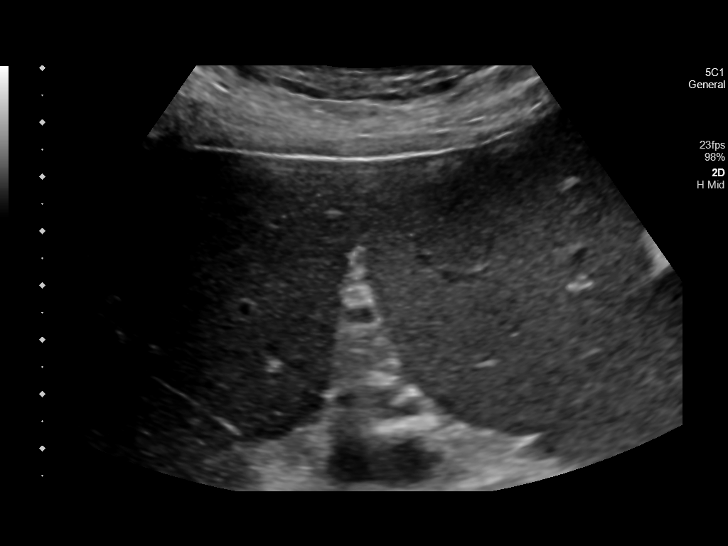
[im 11/64]
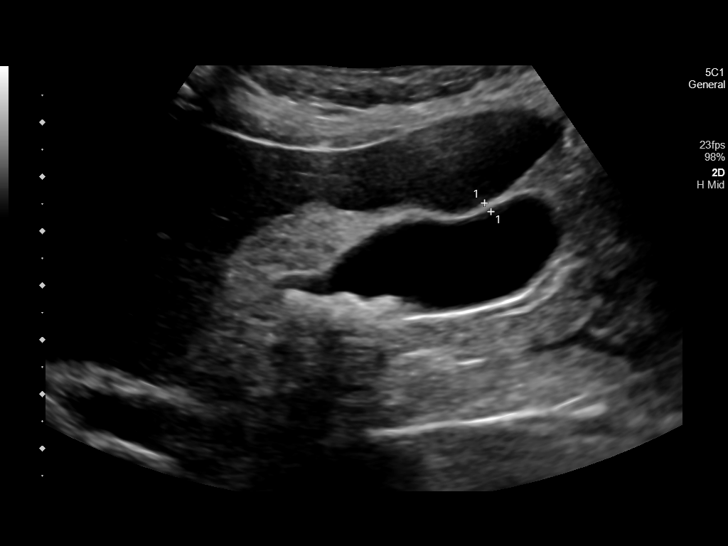
[im 16/64]
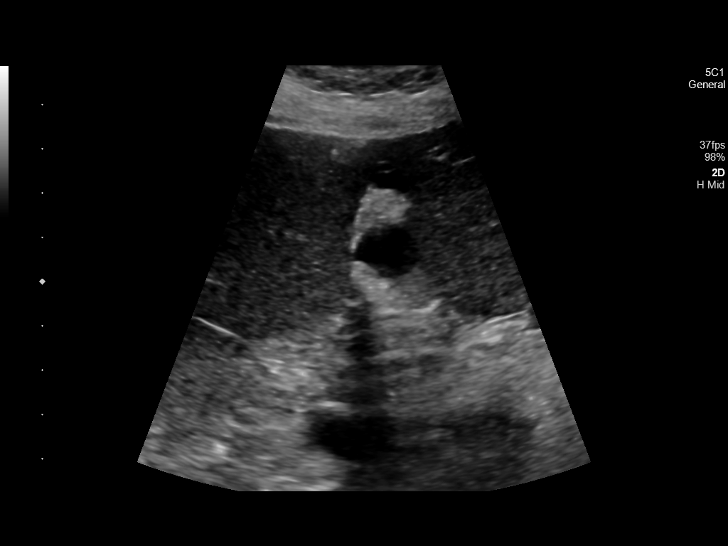
[im 22/64]
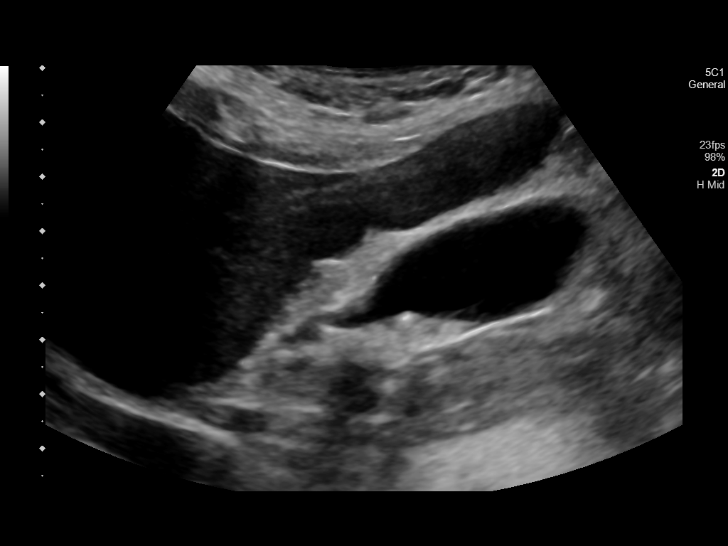
[im 24/64]
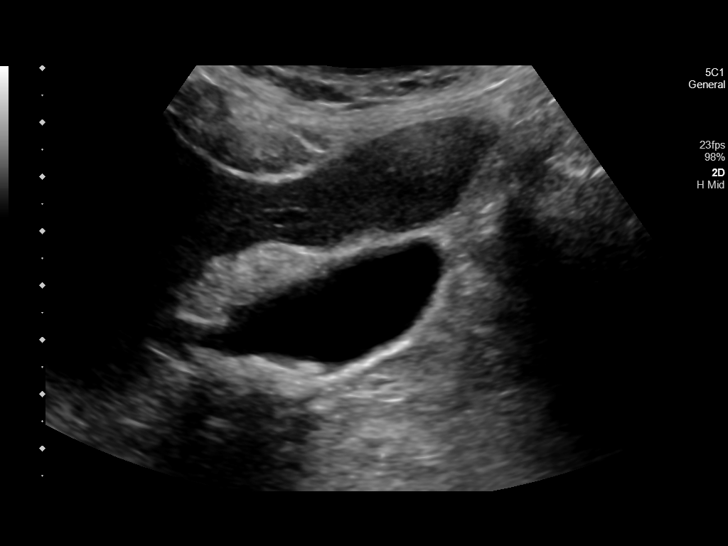
[im 29/64]
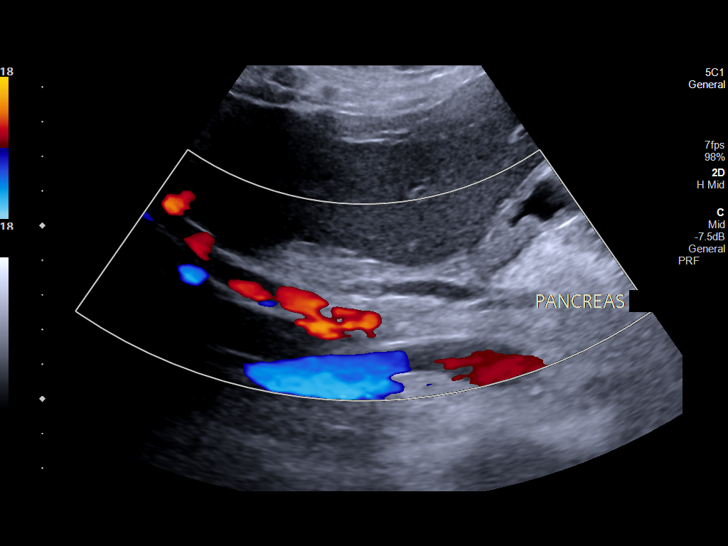
[im 35/64]
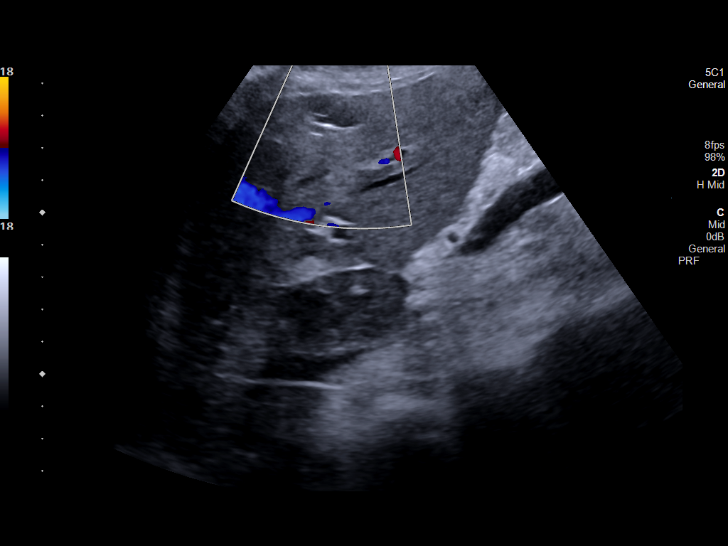
[im 40/64]
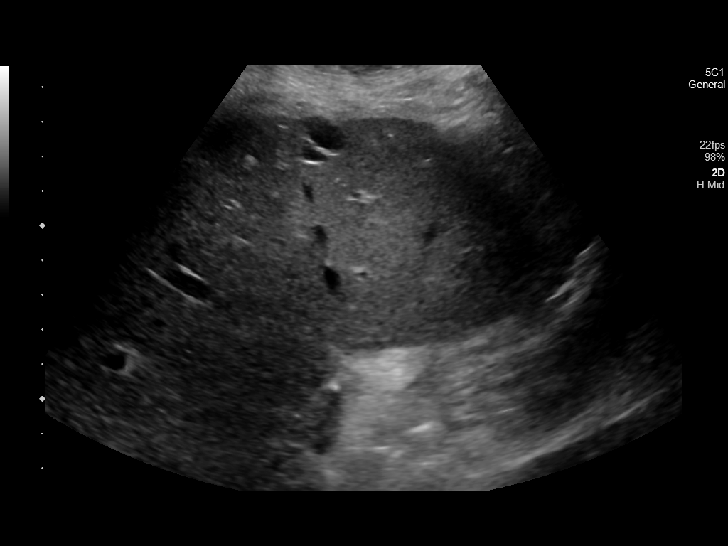
[im 43/64]
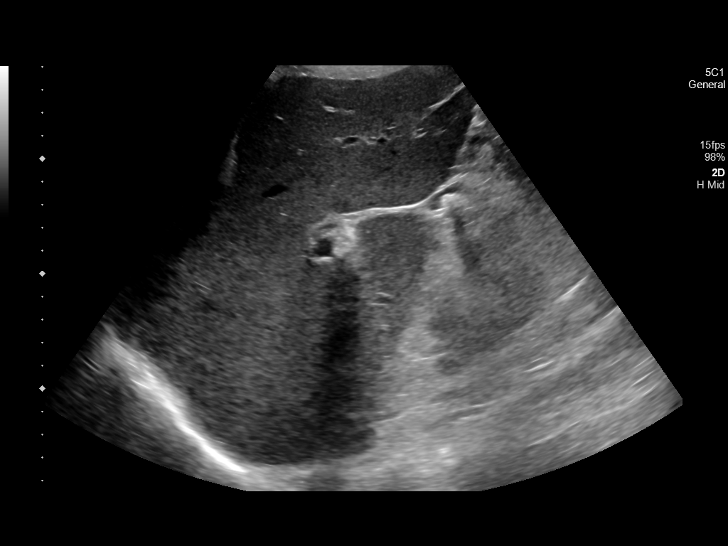
[im 48/64]
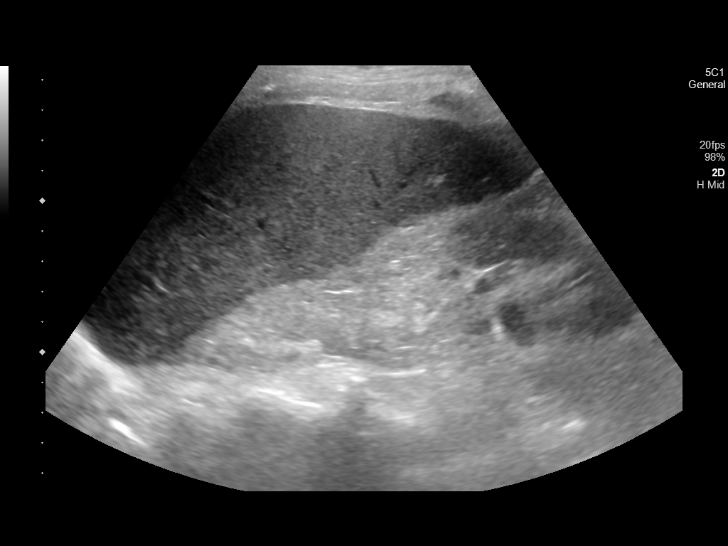
[im 53/64]
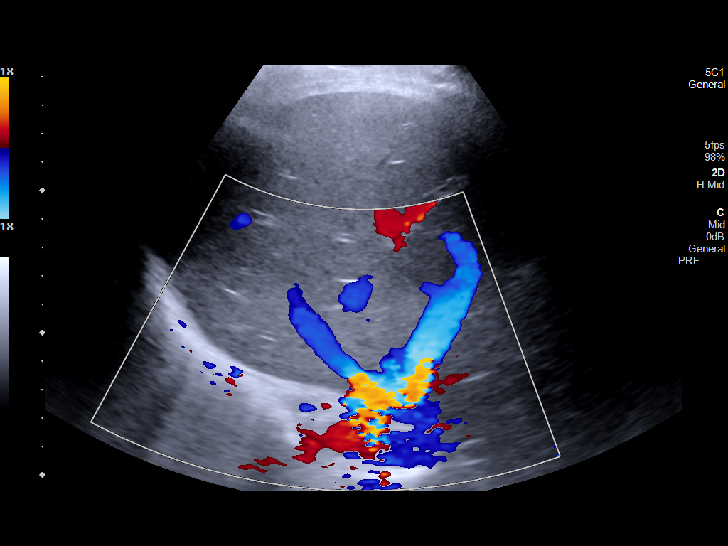
[im 58/64]
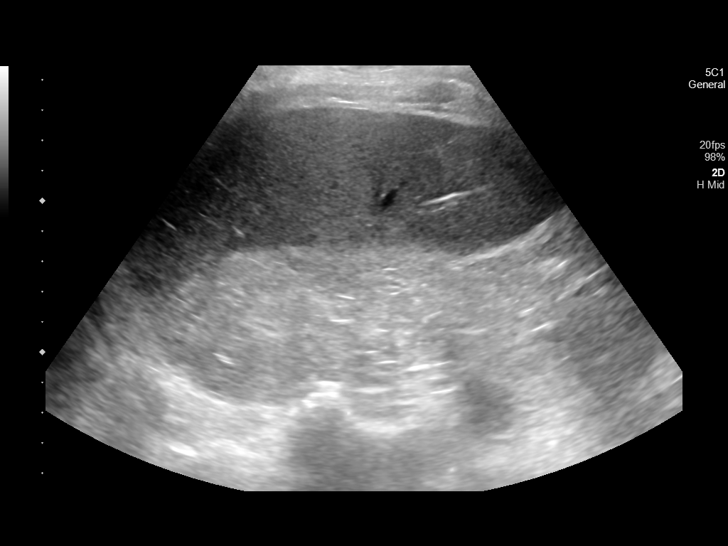
[im 64/64]
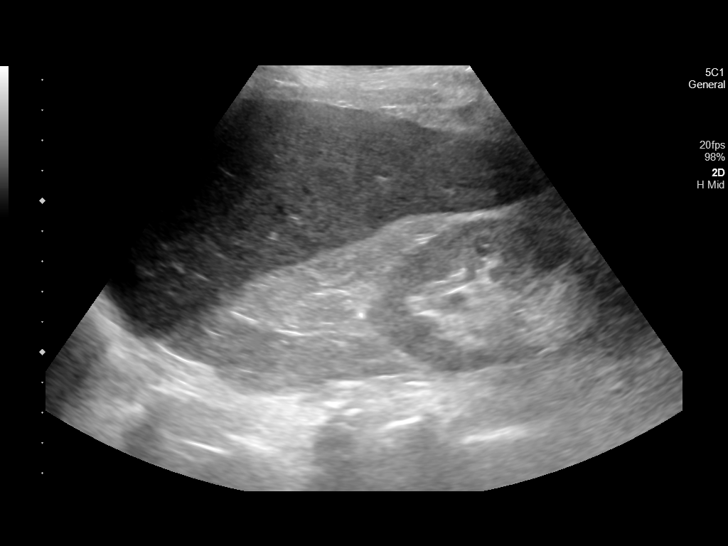

[14 of 25 positions shown; findings below may reference images not displayed]

FINDINGS: Gallbladder:

Multiple mobile echogenic, shadowing gallstones within the
gallbladder lumen measuring up to 4 mm in diameter. No
pericholecystic fluid or wall thickening visualized. No sonographic
Murphy sign noted by sonographer.

Common bile duct:

Diameter: 5 mm.

Liver:

Complex cyst with internal septations within the anterior aspect of
the left hepatic lobe measuring 1.7 x 1.4 x 1.6 cm. Within normal
limits in parenchymal echogenicity. Portal vein is patent on color
Doppler imaging with normal direction of blood flow towards the
liver.

Other: None.
IMPRESSION: 1. Complex 1.7 cm cyst within the anterior left hepatic lobe with
internal septations. Comparison with prior outside cross-sectional
imaging of the abdomen, if available, is recommended to assess
stability. If there is no outside imaging available, a follow-up
contrast enhanced MRI of the abdomen could be performed for further
characterization.
2. Cholelithiasis without sonographic evidence of acute
cholecystitis.

## 2020-03-29 ENCOUNTER — Telehealth: Payer: Self-pay

## 2020-03-29 ENCOUNTER — Other Ambulatory Visit: Payer: Self-pay

## 2020-03-29 DIAGNOSIS — K7689 Other specified diseases of liver: Secondary | ICD-10-CM

## 2020-03-29 NOTE — Telephone Encounter (Signed)
-----   Message from [name], MD sent at 03/28/2020  9:31 PM EST ----- Please let patient know that she has a cyst in the left hepatic lobe.  Recommend patient to proceed with MRI abdomen with contrast for further evaluation.  Please order and arrange.

## 2020-03-29 NOTE — Telephone Encounter (Signed)
Done  MRI has been sched as requested. MRI sched for 04/07/20 @ 9am at [facility]

## 2020-03-29 NOTE — Telephone Encounter (Signed)
Attempted to call twice to both mobile and home phone, but no answer. Left message on mobile phone for pt to call back for [place] results.

## 2020-03-30 NOTE — Telephone Encounter (Signed)
Pt has been notified of MRI appt.

## 2020-04-07 ENCOUNTER — Other Ambulatory Visit: Payer: Self-pay

## 2020-04-07 ENCOUNTER — Telehealth: Payer: Self-pay

## 2020-04-07 ENCOUNTER — Ambulatory Visit
Admission: RE | Admit: 2020-04-07 | Discharge: 2020-04-07 | Disposition: A | Discharge status: Home / Self Care | Payer: Medicare Other | Source: Ambulatory Visit | Attending: Oncology | Admitting: Oncology

## 2020-04-07 DIAGNOSIS — K7689 Other specified diseases of liver: Secondary | ICD-10-CM | POA: Insufficient documentation

## 2020-04-07 DIAGNOSIS — D472 Monoclonal gammopathy: Secondary | ICD-10-CM

## 2020-04-07 IMAGING — MR MR ABDOMEN WO/W CM
17 of 18 series · 47 of 48 positions shown · IV contrast (6ml Gadavist)
Comparison: Ultrasound on [DATE]

CLINICAL DATA: Elevated liver enzymes. Follow-up indeterminate
cystic liver lesion seen on recent ultrasound. Monoclonal gammopathy
of of unknown significance.

EXAM:
MRI ABDOMEN WITHOUT AND WITH CONTRAST
TECHNIQUE: Multiplanar multisequence MR imaging of the abdomen was performed
both before and after the administration of intravenous contrast.
CONTRAST:  6mL GADAVIST GADOBUTROL 1 MMOL/ML IV SOLN

[Series 3: T2 · coronal · 6.0mm · 1.19mm/px · 2 of 30 slices shown (1 of 2)]
[im 1/30]
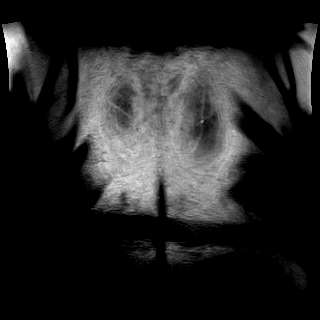
[im 30/30]
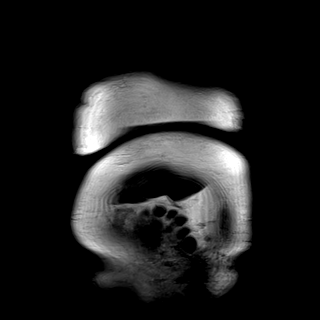

[Series 6: T2 fat-sat · axial · 6.0mm · 1.19mm/px · z∈[-112,+155]mm · 2 of 38 slices shown]
[im 1/38]
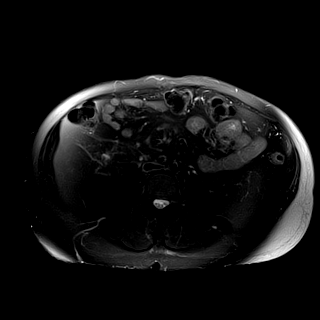
[im 38/38]
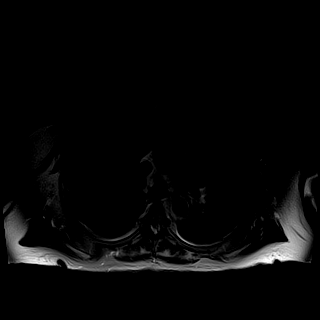

[Series 7: T2 · axial · 6.0mm · 1.19mm/px · z∈[-104,+148]mm · 2 of 36 slices shown (2 of 2)]
[im 1/36]
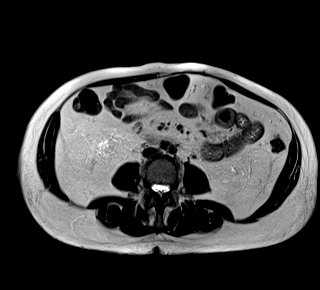
[im 36/36]
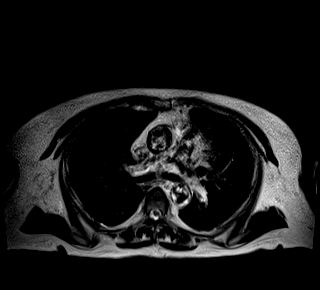

[Series 8: ax dwi_tracew · axial · 6.0mm · 1.42mm/px · z∈[-112,+155]mm · 5 of 114 slices shown]
[im 1/114]
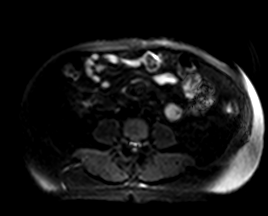
[im 29/114]
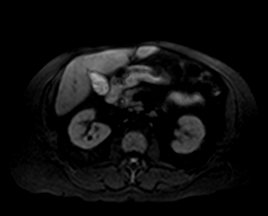
[im 57/114]
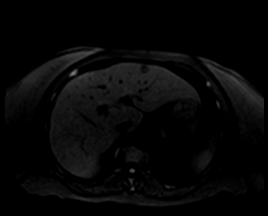
[im 85/114]
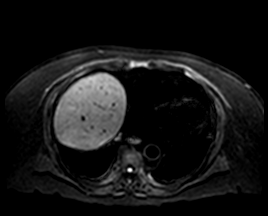
[im 114/114]
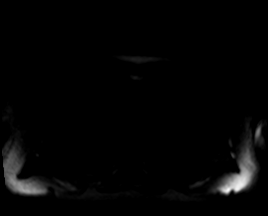

[Series 9: ax dwi_adc · axial · 6.0mm · 1.42mm/px · z∈[-112,+155]mm · 2 of 38 slices shown]
[im 1/38]
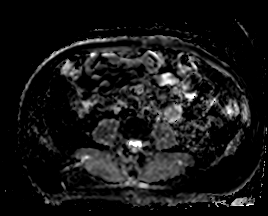
[im 38/38]
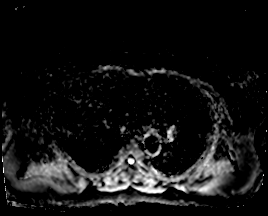

[Series 10: T1 · axial · 6.0mm · 0.74mm/px · z∈[-104,+148]mm · 3 of 72 slices shown]
[im 1/72]
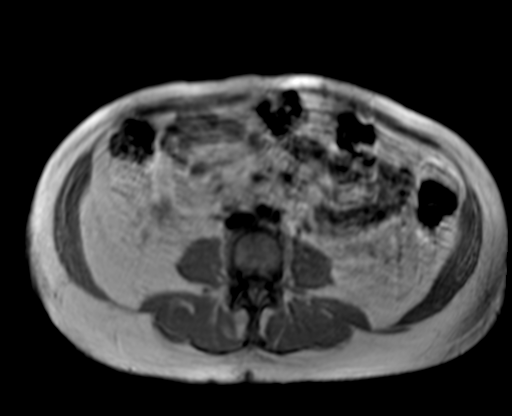
[im 36/72]
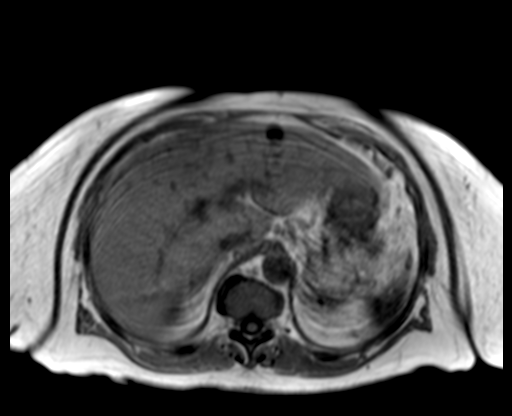
[im 72/72]
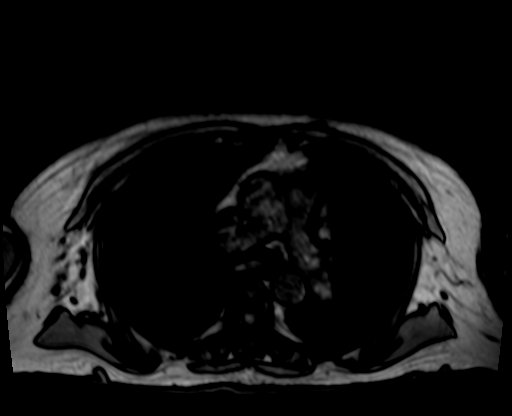

[Series 11: bSSFP · axial · 6.0mm · 0.74mm/px · 1 of 36 slices shown]
[im 1/36]
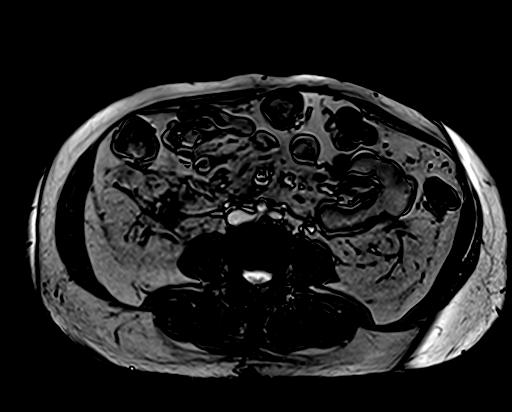

[Series 12: T1 dynamic fat-sat · axial · non-contrast · 3.0mm · 1.19mm/px · z∈[-109,+152]mm · 3 of 88 slices shown (1 of 5)]
[im 1/88]
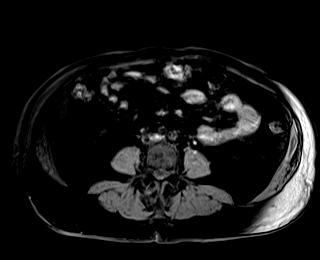
[im 44/88]
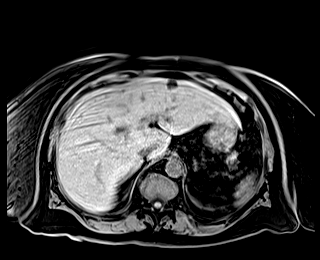
[im 88/88]
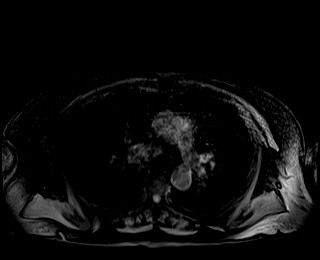

[Series 13: T1 dynamic fat-sat post-contrast · axial · 3.0mm · 1.19mm/px · z∈[-109,+152]mm · 3 of 88 slices shown (1 of 4)]
[im 1/88]
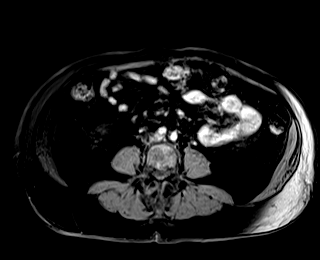
[im 44/88]
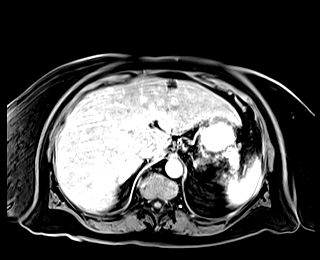
[im 88/88]
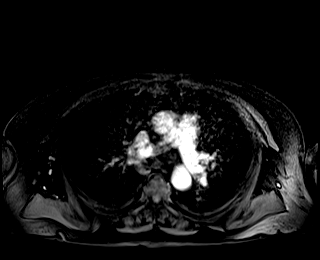

[Series 14: T1 dynamic fat-sat · axial · 3.0mm · 1.19mm/px · z∈[-109,+152]mm · 3 of 88 slices shown (2 of 5)]
[im 1/88]
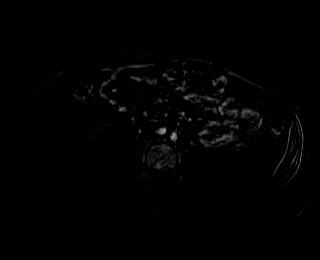
[im 44/88]
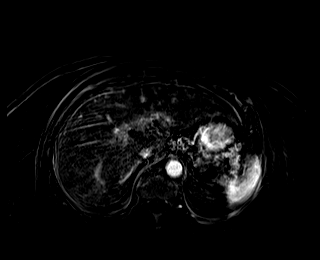
[im 88/88]
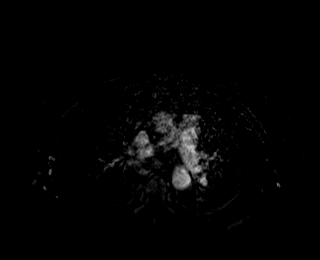

[Series 15: T1 dynamic fat-sat post-contrast · axial · 3.0mm · 1.19mm/px · z∈[-109,+152]mm · 3 of 88 slices shown (2 of 4)]
[im 1/88]
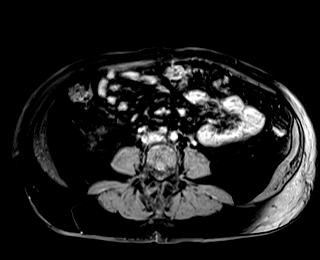
[im 44/88]
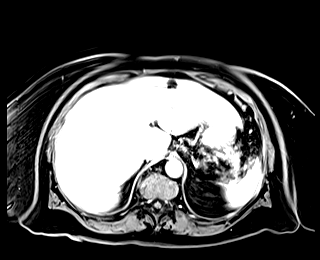
[im 88/88]
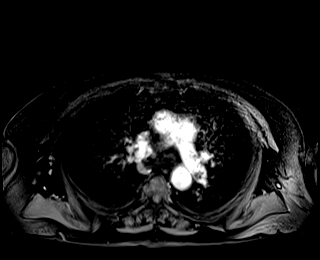

[Series 16: T1 dynamic fat-sat · axial · 3.0mm · 1.19mm/px · z∈[-109,+152]mm · 3 of 88 slices shown (3 of 5)]
[im 1/88]
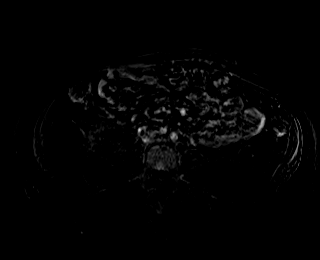
[im 44/88]
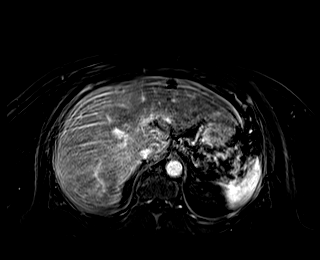
[im 88/88]
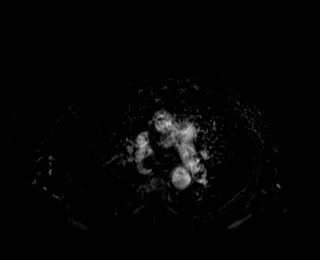

[Series 17: T1 dynamic fat-sat post-contrast · axial · 3.0mm · 1.19mm/px · z∈[-109,+152]mm · 3 of 88 slices shown (3 of 4)]
[im 1/88]
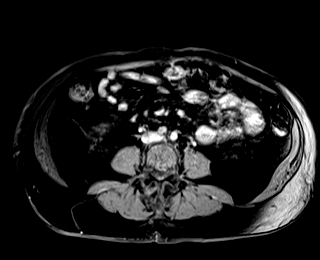
[im 44/88]
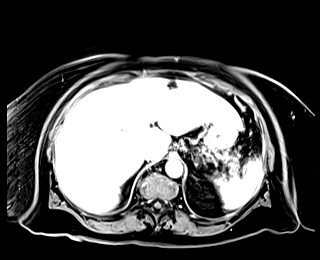
[im 88/88]
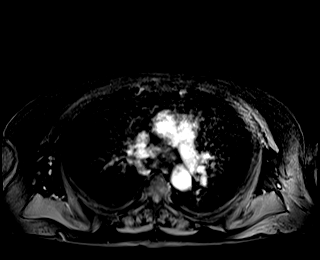

[Series 18: T1 dynamic fat-sat · axial · 3.0mm · 1.19mm/px · z∈[-109,+152]mm · 3 of 88 slices shown (4 of 5)]
[im 1/88]
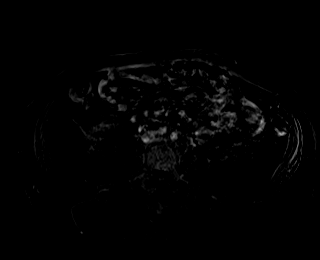
[im 44/88]
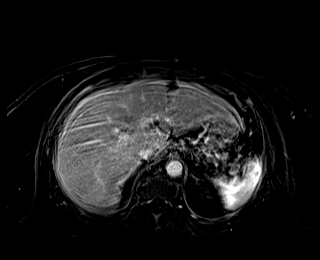
[im 88/88]
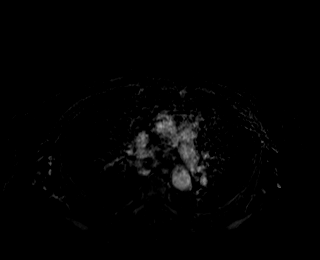

[Series 19: T1 dynamic post-contrast · coronal · 3.0mm · 1.31mm/px · 3 of 72 slices shown]
[im 1/72]
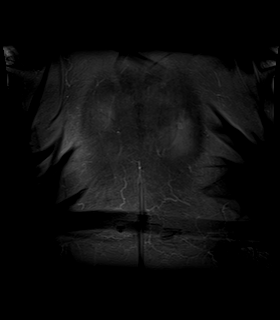
[im 36/72]
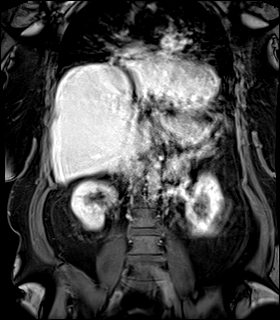
[im 72/72]
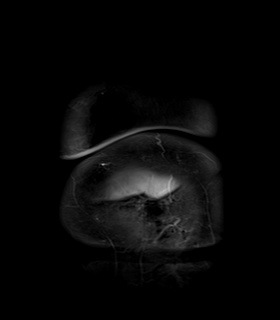

[Series 20: T1 dynamic fat-sat post-contrast · axial · 3.0mm · 1.19mm/px · z∈[-109,+152]mm · 3 of 88 slices shown (4 of 4)]
[im 1/88]
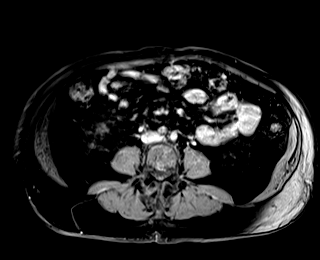
[im 44/88]
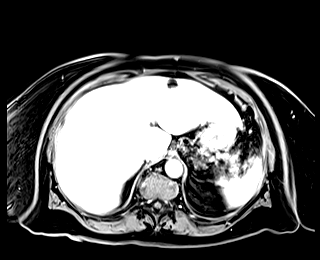
[im 88/88]
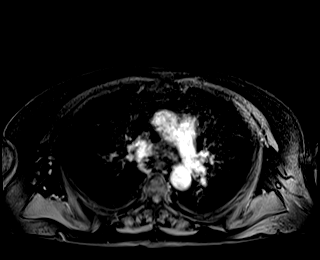

[Series 21: T1 dynamic fat-sat · axial · 3.0mm · 1.19mm/px · z∈[-109,+152]mm · 3 of 88 slices shown (5 of 5)]
[im 1/88]
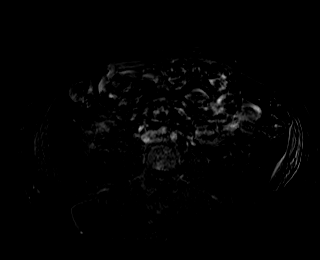
[im 44/88]
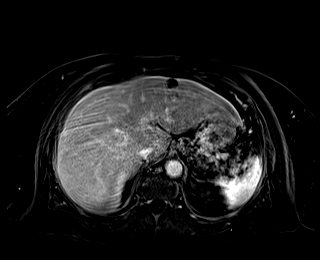
[im 88/88]
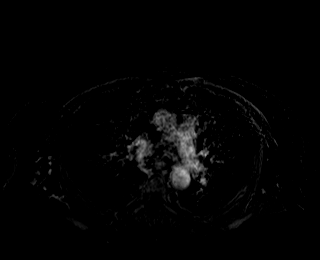

[47 of 48 positions shown; findings below may reference images not displayed]

FINDINGS: Lower chest: No acute findings.

Hepatobiliary: 2 minimally complicated cysts are seen lateral
segment of the left lobe, measuring 1.5 cm and 1.2 cm in maximum
diameter. Both of these lesions contain a few thin internal
septations, but no evidence of thickened septations enhancing soft
tissue component.

A poorly defined hypovascular lesion is seen in the posterior right
hepatic lobe which measures 1.4 cm on image 51/17. A probable
similar but smaller lesion is seen in the anterior right lobe
measuring 9 mm on image 38/17. These lesions are not seen on T2 or
diffusion weighted sequences, which although nonspecific, are
suggestive of benign etiology.

Several tiny less than 5 mm gallstones are seen, however there is no
evidence of cholecystitis or biliary ductal dilatation.

Pancreas:  No mass or inflammatory changes.

Spleen:  Within normal limits in size and appearance.

Adrenals/Urinary Tract: No masses identified. A few small renal
cysts noted bilaterally. No evidence of hydronephrosis.

Stomach/Bowel: Visualized portion unremarkable.

Vascular/Lymphatic: No pathologically enlarged lymph nodes
identified. No abdominal aortic aneurysm.

Other:  None.

Musculoskeletal: Diffuse T2 hyperintensity and abnormal contrast
enhancement are seen throughout the L1 vertebral body, raising
suspicion for multiple myeloma in this patient with known MGUS.
IMPRESSION: Two benign cystic lesions in left hepatic lobe, which correspond
with the lesions seen on recent ultrasound.

2 nonspecific hypovascular lesions in the right hepatic lobe,
largest measuring 1.4 cm. Recommend continued follow-up by MRI in 6
months.

Abnormal bone marrow signal throughout the L1 vertebral body,
raising suspicion for multiple myeloma in this patient with known
MGUS. Consider dedicated lumbar spine MRI without and with contrast
or PET-CT for further evaluation.

Cholelithiasis. No radiographic evidence of cholecystitis or biliary
ductal dilatation.

## 2020-04-07 MED ORDER — GADOBUTROL 1 MMOL/ML IV SOLN
6.0000 mL | Freq: Once | INTRAVENOUS | Status: AC | PRN
  Administered 2020-04-07: 6 mL via INTRAVENOUS

## 2020-04-07 NOTE — Telephone Encounter (Signed)
-----  Message from Zhou Yu, MD sent at 04/07/2020 12:18 PM EST ----- I communicated with her about the MRI results. Please arrange patient to have bone marrow biopsy done.  She prefers to do it after the new year. Please schedule her to see me 2 weeks after the bone marrow biopsy.  MD only.  Thank you 

## 2020-04-07 NOTE — Telephone Encounter (Signed)
Request faxed to specialized scheduling. Pending appts details.

## 2020-04-11 NOTE — Telephone Encounter (Signed)
Done...  Pt has been sched to RTC on 05/05/20 @ 10:45 for MD follow up 2 weeks after BM bx.

## 2020-04-11 NOTE — Telephone Encounter (Signed)
Ms Hadsall is scheduled for CT Bone Marrow Biopsy Wed 04/20/20 8:30a Arrive 7:30a. Ellison Hughs, please schedule MD follow up 2 weeks after BM bx. I will call pt with appts

## 2020-04-12 NOTE — Telephone Encounter (Signed)
Pt informed of bmbx appt and MD follow up. Cannot come on 1/20. Please change MD appt to 1/21 @ 2:15, pt aware.

## 2020-04-12 NOTE — Telephone Encounter (Signed)
Done...  MD 1/20 appt changed to 1/21 @ 2:15, pt is aware

## 2020-04-14 NOTE — Progress Notes (Signed)
Patient on schedule for BMB 04/20/2020. Spoke with patient on phone with pre procedure instructions given. Made aware to be here @ 0730, NPO after MN prior to procedure,hold am dose of glypizide, and driver for discharge post procedure/recovery. Stated understanding.

## 2020-04-18 ENCOUNTER — Other Ambulatory Visit: Payer: Self-pay | Admitting: Radiology

## 2020-04-18 ENCOUNTER — Telehealth: Payer: Self-pay | Admitting: Oncology

## 2020-04-18 ENCOUNTER — Other Ambulatory Visit: Payer: Self-pay

## 2020-04-18 DIAGNOSIS — M899 Disorder of bone, unspecified: Secondary | ICD-10-CM

## 2020-04-18 NOTE — Telephone Encounter (Signed)
Pt left VM that she would like to cancel her 1/5 Biopsy appt and have a MRI scheduled in it's place. She is requesting a call from Dr. Cathie Hoops to discuss her decision. Pt can be reached at number attached.

## 2020-04-18 NOTE — Telephone Encounter (Signed)
Message sent to specialized scheduling to cancel biopsy scheduled on 1/5.   Nancy Rodriguez please schedule MRI lumbar spine (order entered). Keep follow up appt on 1/21 as scheduled. If she prefers further discussion now, please add another discussion. Please call pt with appt.

## 2020-04-18 NOTE — Telephone Encounter (Signed)
Done.Marland Kitchen MRI has bees sched as requested. Pt was made aware of the location, date and time Per pt request to RTC on 05/06/20 for her already sched appt for discussion.

## 2020-04-18 NOTE — Telephone Encounter (Signed)
Please cancel BM biopsy as requested. Please obtain MRI lumbar spine w and wo- bone lesion on MRI abd. Keep current follow up schedule if MRI can be done prior to that.  If she prefers further discussion now , please add another virtual visit. Thanks.

## 2020-04-20 ENCOUNTER — Ambulatory Visit
Admission: RE | Admit: 2020-04-20 | Discharge: 2020-04-20 | Disposition: A | Discharge status: Home / Self Care | Payer: Medicare Other | Source: Ambulatory Visit | Attending: Oncology | Admitting: Oncology

## 2020-04-22 ENCOUNTER — Other Ambulatory Visit: Payer: Self-pay | Admitting: Gastroenterology

## 2020-04-22 DIAGNOSIS — R748 Abnormal levels of other serum enzymes: Secondary | ICD-10-CM

## 2020-04-22 DIAGNOSIS — R7401 Elevation of levels of liver transaminase levels: Secondary | ICD-10-CM

## 2020-04-26 ENCOUNTER — Other Ambulatory Visit: Payer: Self-pay | Admitting: Student

## 2020-04-26 ENCOUNTER — Ambulatory Visit: Admission: RE | Admit: 2020-04-26 | Payer: Medicare Other | Source: Ambulatory Visit

## 2020-04-28 ENCOUNTER — Ambulatory Visit: Admission: RE | Admit: 2020-04-28 | Payer: Medicare Other | Source: Ambulatory Visit

## 2020-05-05 ENCOUNTER — Ambulatory Visit: Payer: Medicare Other | Admitting: Oncology

## 2020-05-06 ENCOUNTER — Encounter: Payer: Self-pay | Admitting: Oncology

## 2020-05-06 ENCOUNTER — Inpatient Hospital Stay: Payer: Medicare Other | Attending: Oncology | Admitting: Oncology

## 2020-05-06 ENCOUNTER — Telehealth: Payer: Self-pay

## 2020-05-06 DIAGNOSIS — D472 Monoclonal gammopathy: Secondary | ICD-10-CM | POA: Diagnosis not present

## 2020-05-06 DIAGNOSIS — K769 Liver disease, unspecified: Secondary | ICD-10-CM

## 2020-05-06 DIAGNOSIS — R7401 Elevation of levels of liver transaminase levels: Secondary | ICD-10-CM

## 2020-05-06 NOTE — Progress Notes (Signed)
HEMATOLOGY-ONCOLOGY TeleHEALTH VISIT PROGRESS NOTE  I connected with Nancy Rodriguez on 05/06/20  at  2:15 PM EST by video enabled telemedicine visit and verified that I am speaking with the correct person using two identifiers. I discussed the limitations, risks, security and privacy concerns of performing an evaluation and management service by telemedicine and the availability of in-person appointments. The patient expressed understanding and agreed to proceed.   Other persons participating in the visit and their role in the encounter:  None  Patient's location: Home  Provider's location: office Chief Complaint: MGUS, questions of regarding to her MRI and biopsy   INTERVAL HISTORY Nancy Rodriguez is a 75 y.o. female who has above history reviewed by me today presents for follow up visit for MGUS and abnormal MRI.  Problems and complaints are listed below:  I attempted to connect the patient for visual enabled telehealth visit.  Due to the technical difficulties with video,  Patient was transitioned to audio only visit. During the interval she was seen by [place] and his team for transaminitis, and elevated alkaline phosphatase. She was reccommended to have liver biopsy.   She was recommended by me for bone marrow biopsy and dedicated MRI lumbar spine. She wants to defer BM biopsy and wishes to proceed MRI. She did not get MRI done due to weather.   Review of Systems  Constitutional: Negative for appetite change, chills, fatigue and fever.  HENT:   Negative for hearing loss and voice change.   Eyes: Negative for eye problems.  Respiratory: Negative for chest tightness and cough.   Cardiovascular: Negative for chest pain.  Gastrointestinal: Negative for abdominal distention, abdominal pain and blood in stool.  Endocrine: Negative for hot flashes.  Genitourinary: Negative for difficulty urinating and frequency.   Musculoskeletal: Negative for arthralgias.  Skin:  Negative for itching and rash.  Neurological: Negative for extremity weakness.  Hematological: Negative for adenopathy.  Psychiatric/Behavioral: Negative for confusion.    Past Medical History:  Diagnosis Date  . Anemia    Anemia in chronic kidney disease  . Chronic kidney disease    Stage 3b chronic kidney disease  . Diabetes mellitus without complication ([place])   . Hypercholesterolemia   . Hypertension   . MGUS (monoclonal gammopathy of unknown significance)   . Osteoarthritis   . Rheumatoid arthritis [facility])    Past Surgical History:  Procedure Laterality Date  . HERNIA REPAIR    . PARATHYROIDECTOMY      Family History  Problem Relation Age of Onset  . Diabetes Daughter   . Diabetes Son     Social History   Socioeconomic History  . Marital status: Widowed    Spouse name: Not on file  . Number of children: Not on file  . Years of education: Not on file  . Highest education level: Not on file  Occupational History  . Not on file  Tobacco Use  . Smoking status: Never Smoker  . Smokeless tobacco: Never Used  Substance and Sexual Activity  . Alcohol use: No  . Drug use: Never  . Sexual activity: Not on file  Other Topics Concern  . Not on file  Social History Narrative  . Not on file   Social Determinants of Health   Financial Resource Strain: Not on file  Food Insecurity: Not on file  Transportation Needs: Not on file  Physical Activity: Not on file  Stress: Not on file  Social Connections: Not on file  Intimate Partner  Violence: Not on file    Current Outpatient Medications on File Prior to Visit  Medication Sig Dispense Refill  . albuterol (VENTOLIN HFA) 108 (90 Base) MCG/ACT inhaler Inhale 2 puffs into the lungs every 4 (four) hours as needed.     Marland Kitchen amLODipine (NORVASC) 5 MG tablet Take by mouth.    Marland Kitchen aspirin EC 81 MG tablet Take 81 mg by mouth daily.    Marland Kitchen docusate sodium (COLACE) 100 MG capsule Take 1 capsule by mouth as needed.    . ferrous sulfate  325 (65 FE) MG EC tablet TAKE 1 TABLET BY MOUTH EVERY DAY 90 tablet 1  . glipiZIDE (GLUCOTROL XL) 10 MG 24 hr tablet Take by mouth. 1 QAM 1 QPM    . metoprolol succinate (TOPROL-XL) 50 MG 24 hr tablet TAKE 1 TABLET (50 MG TOTAL) BY MOUTH DAILY IN THE MORNING    . potassium chloride SA (KLOR-CON) 20 MEQ tablet Take 20 mEq by mouth daily.     . prednisoLONE acetate (PRED FORTE) 1 % ophthalmic suspension Place 1 drop into both eyes daily.     . canagliflozin (INVOKANA) 100 MG TABS tablet Take by mouth. (Patient not taking: No sig reported)    . diazepam (VALIUM) 5 MG tablet Take 1 tablet (5 mg total) by mouth every 8 (eight) hours as needed for muscle spasms. (Patient not taking: No sig reported) 20 tablet 0  . EPINEPHrine 0.3 mg/0.3 mL IJ SOAJ injection as needed (Patient not taking: Reported on 05/06/2020)    . rosuvastatin (CRESTOR) 20 MG tablet Take 20 mg by mouth daily.  (Patient not taking: Reported on 05/06/2020)     No current facility-administered medications on file prior to visit.    Allergies  Allergen Reactions  . Benazepril Anaphylaxis and Swelling    TONGUE AND LIPS TONGUE AND LIPS   . Lisinopril Swelling  . Tolmetin Swelling  . Nsaids Swelling       Observations/Objective: Today's Vitals   05/06/20 1333  PainSc: 0-No pain   There is no height or weight on file to calculate BMI.  Physical Exam  CBC    Component Value Date/Time   WBC 6.5 03/16/2020 1124   RBC 4.78 03/16/2020 1124   HGB 12.0 03/16/2020 1124   HCT 36.9 03/16/2020 1124   PLT 395 03/16/2020 1124   MCV 77.2 (L) 03/16/2020 1124   MCH 25.1 (L) 03/16/2020 1124   MCHC 32.5 03/16/2020 1124   RDW 18.8 (H) 03/16/2020 1124   LYMPHSABS 2.0 03/16/2020 1124   MONOABS 0.6 03/16/2020 1124   EOSABS 0.2 03/16/2020 1124   BASOSABS 0.1 03/16/2020 1124    CMP     Component Value Date/Time   NA 135 03/16/2020 1124   K 3.8 03/16/2020 1124   CL 100 03/16/2020 1124   CO2 24 03/16/2020 1124   GLUCOSE 278 (H)  03/16/2020 1124   BUN 25 (H) 03/16/2020 1124   CREATININE 1.37 (H) 03/16/2020 1124   CALCIUM 9.7 03/16/2020 1124   PROT 7.7 03/16/2020 1124   ALBUMIN 3.1 (L) 03/16/2020 1124   AST 66 (H) 03/16/2020 1124   ALT 55 (H) 03/16/2020 1124   ALKPHOS 520 (H) 03/16/2020 1124   BILITOT 1.0 03/16/2020 1124   GFRNONAA 40 (L) 03/16/2020 1124   GFRAA 54 (L) 09/02/2019 1608     Assessment and Plan: 1. MGUS (monoclonal gammopathy of unknown significance)   2. Transaminitis   3. Liver lesion, right lobe     # IgG  lamda MGUS Abnormal bone marrow signal on MRI abdomen.  Recommend dedicated MRI lumbar spine for further evaluation. Likely need BM biopsy which patient would like to defer for now.   # Transaminitis, elevated alkaline phosphatase,  She has had GI work up and was recommended liver biopsy. Suspect infiltrative disease. GI recommendation was reviewed and explained to her.   # Right liver lesions, MRI abdomen showed non specific characteristics. Monitor, repeat MRI in 5 months.    Follow Up Instructions:  Keep current appointment.   I discussed the assessment and treatment plan with the patient. The patient was provided an opportunity to ask questions and all were answered. The patient agreed with the plan and demonstrated an understanding of the instructions.  The patient was advised to call back or seek an in-person evaluation if the symptoms worsen or if the condition fails to improve as anticipated.   [name], MD 05/06/2020 8:50 PM

## 2020-05-09 NOTE — Progress Notes (Signed)
Patient on schedule for Liver biopsy 05/11/2020/ called and spoke with patient on phone with pre procedure instructions given. Made aware to be here @ 0930, NPO after Mn prior to procedure., have driver for discharge post procedure/recovery, and to hold am dose of glypizide. Stated understanding.

## 2020-05-10 ENCOUNTER — Other Ambulatory Visit: Payer: Self-pay | Admitting: Student

## 2020-05-10 ENCOUNTER — Other Ambulatory Visit: Payer: Self-pay | Admitting: Radiology

## 2020-05-11 ENCOUNTER — Ambulatory Visit
Admission: RE | Admit: 2020-05-11 | Discharge: 2020-05-11 | Disposition: A | Discharge status: Home / Self Care | Payer: Medicare Other | Source: Ambulatory Visit | Attending: Oncology | Admitting: Oncology

## 2020-05-11 ENCOUNTER — Other Ambulatory Visit: Payer: Self-pay

## 2020-05-11 ENCOUNTER — Ambulatory Visit
Admission: RE | Admit: 2020-05-11 | Discharge: 2020-05-11 | Disposition: A | Discharge status: Home / Self Care | Payer: Medicare Other | Source: Ambulatory Visit | Attending: Gastroenterology | Admitting: Gastroenterology

## 2020-05-11 DIAGNOSIS — E1122 Type 2 diabetes mellitus with diabetic chronic kidney disease: Secondary | ICD-10-CM | POA: Diagnosis not present

## 2020-05-11 DIAGNOSIS — N1832 Chronic kidney disease, stage 3b: Secondary | ICD-10-CM | POA: Diagnosis not present

## 2020-05-11 DIAGNOSIS — Z7984 Long term (current) use of oral hypoglycemic drugs: Secondary | ICD-10-CM | POA: Diagnosis not present

## 2020-05-11 DIAGNOSIS — D631 Anemia in chronic kidney disease: Secondary | ICD-10-CM | POA: Insufficient documentation

## 2020-05-11 DIAGNOSIS — Z7982 Long term (current) use of aspirin: Secondary | ICD-10-CM | POA: Insufficient documentation

## 2020-05-11 DIAGNOSIS — Z833 Family history of diabetes mellitus: Secondary | ICD-10-CM | POA: Insufficient documentation

## 2020-05-11 DIAGNOSIS — E854 Organ-limited amyloidosis: Secondary | ICD-10-CM | POA: Diagnosis not present

## 2020-05-11 DIAGNOSIS — M47816 Spondylosis without myelopathy or radiculopathy, lumbar region: Secondary | ICD-10-CM | POA: Insufficient documentation

## 2020-05-11 DIAGNOSIS — M899 Disorder of bone, unspecified: Secondary | ICD-10-CM | POA: Insufficient documentation

## 2020-05-11 DIAGNOSIS — Z886 Allergy status to analgesic agent status: Secondary | ICD-10-CM | POA: Diagnosis not present

## 2020-05-11 DIAGNOSIS — Z79899 Other long term (current) drug therapy: Secondary | ICD-10-CM | POA: Diagnosis not present

## 2020-05-11 DIAGNOSIS — K77 Liver disorders in diseases classified elsewhere: Secondary | ICD-10-CM | POA: Diagnosis not present

## 2020-05-11 DIAGNOSIS — D472 Monoclonal gammopathy: Secondary | ICD-10-CM | POA: Diagnosis not present

## 2020-05-11 DIAGNOSIS — R7401 Elevation of levels of liver transaminase levels: Secondary | ICD-10-CM

## 2020-05-11 DIAGNOSIS — R748 Abnormal levels of other serum enzymes: Secondary | ICD-10-CM

## 2020-05-11 DIAGNOSIS — Z888 Allergy status to other drugs, medicaments and biological substances status: Secondary | ICD-10-CM | POA: Diagnosis not present

## 2020-05-11 DIAGNOSIS — I129 Hypertensive chronic kidney disease with stage 1 through stage 4 chronic kidney disease, or unspecified chronic kidney disease: Secondary | ICD-10-CM | POA: Insufficient documentation

## 2020-05-11 LAB — PROTIME-INR
INR: 1.1 (ref 0.8–1.2)
Prothrombin Time: 14 seconds (ref 11.4–15.2)

## 2020-05-11 LAB — CBC
HCT: 36.5 % (ref 36.0–46.0)
Hemoglobin: 12.4 g/dL (ref 12.0–15.0)
MCH: 25.5 pg — ABNORMAL LOW (ref 26.0–34.0)
MCHC: 34 g/dL (ref 30.0–36.0)
MCV: 74.9 fL — ABNORMAL LOW (ref 80.0–100.0)
Platelets: 430 10*3/uL — ABNORMAL HIGH (ref 150–400)
RBC: 4.87 MIL/uL (ref 3.87–5.11)
RDW: 20.6 % — ABNORMAL HIGH (ref 11.5–15.5)
WBC: 6.7 10*3/uL (ref 4.0–10.5)
nRBC: 1 % — ABNORMAL HIGH (ref 0.0–0.2)

## 2020-05-11 LAB — GLUCOSE, CAPILLARY: Glucose-Capillary: 364 mg/dL — ABNORMAL HIGH (ref 70–99)

## 2020-05-11 IMAGING — US US BIOPSY CORE LIVER
1 series · 7 of 7 positions shown · non-contrast
Comparison: none

INDICATION: Elevated alkaline phosphatase and liver transaminase levels.

[Series 1: us biopsy · 7 of 7 slices shown]
[im 1/7]
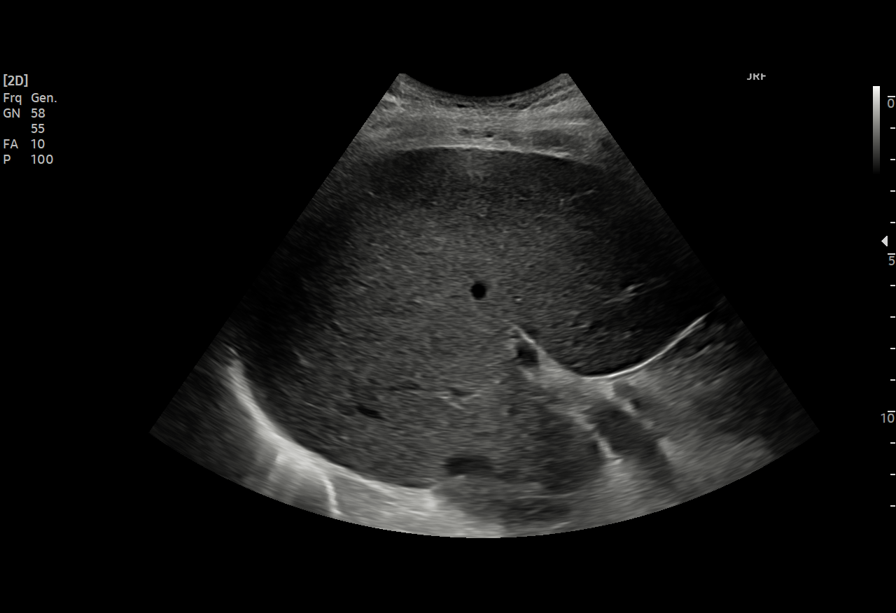
[im 2/7]
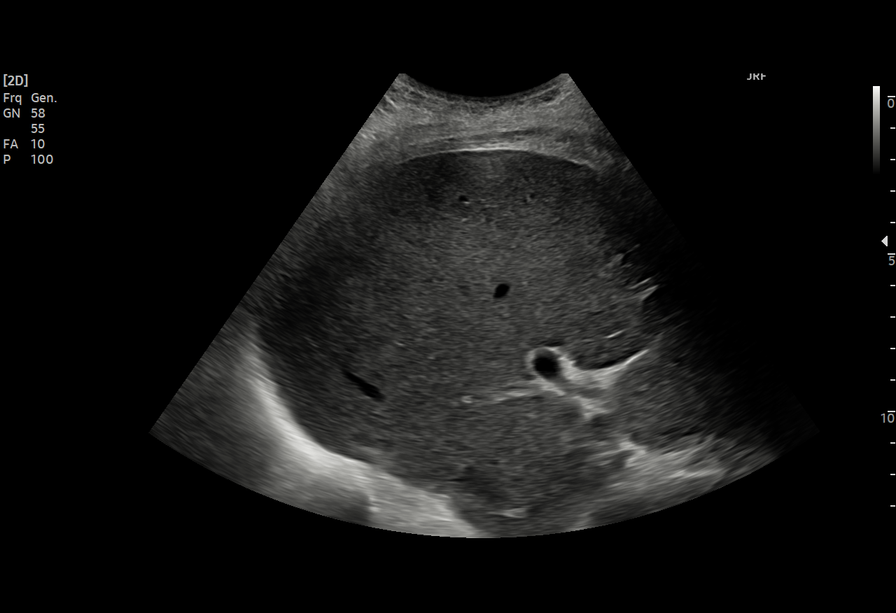
[im 3/7]
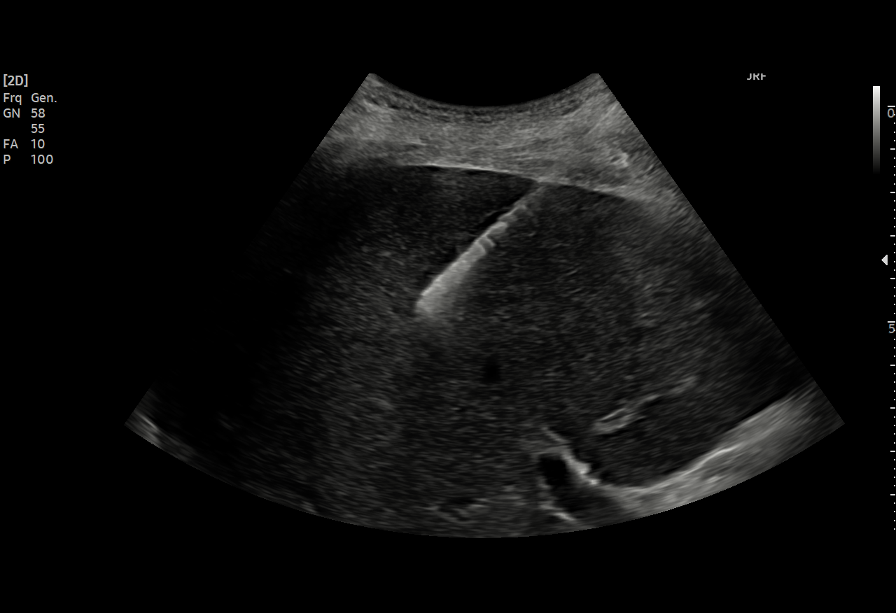
[im 4/7]
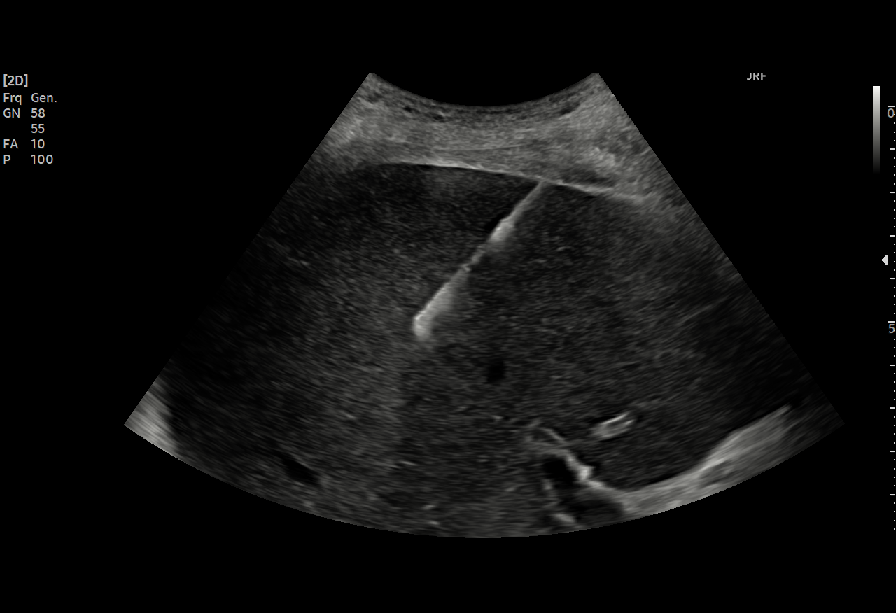
[im 5/7]
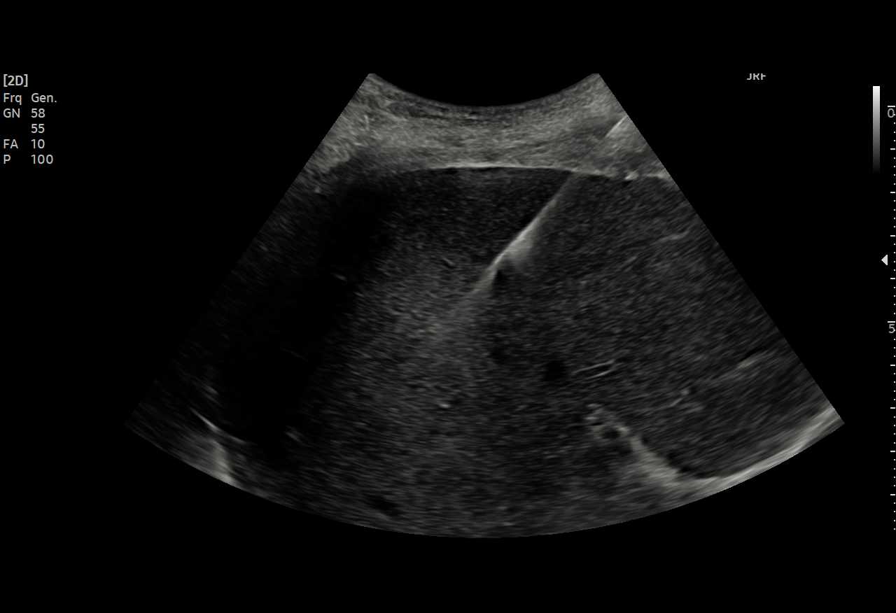
[im 6/7]
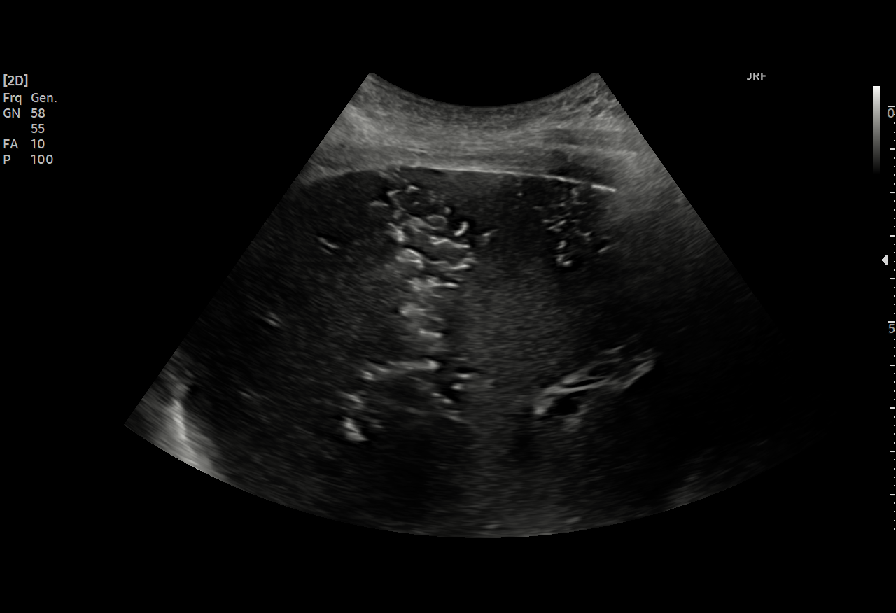
[im 7/7]
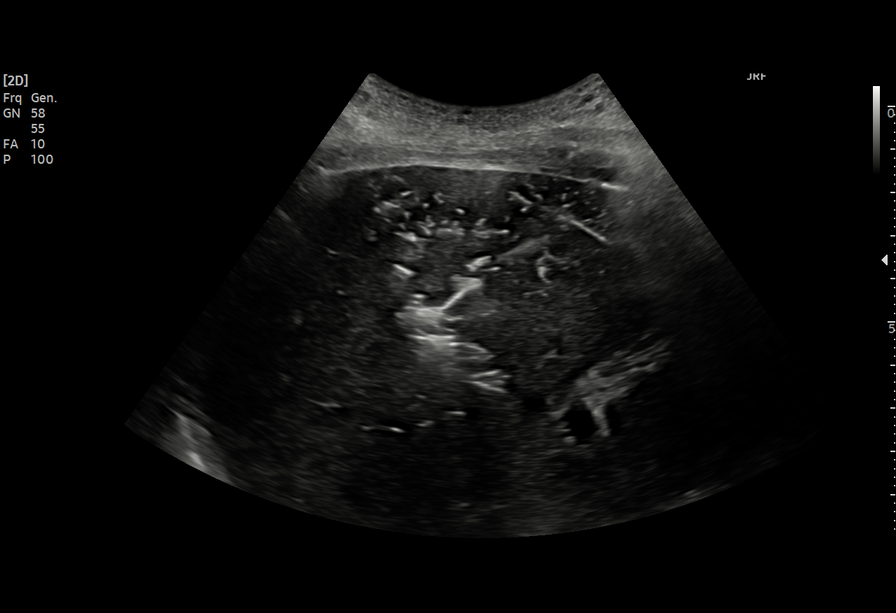

[7 of 7 positions shown; findings below may reference images not displayed]

EXAM:
ULTRASOUND GUIDED CORE BIOPSY OF LIVER

MEDICATIONS:
None.

ANESTHESIA/SEDATION:
Fentanyl 100 mcg IV; Versed 2.0 mg IV

Moderate Sedation Time:  15 minutes.

The patient was continuously monitored during the procedure by the
interventional radiology nurse under my direct supervision.

PROCEDURE:
The procedure, risks, benefits, and alternatives were explained to
the patient. Questions regarding the procedure were encouraged and
answered. The patient understands and consents to the procedure. A
time-out was performed prior to initiating the procedure.

The liver was localized by ultrasound. The abdominal wall was
prepped with chlorhexidine in a sterile fashion, and a sterile drape
was applied covering the operative field. A sterile gown and sterile
gloves were used for the procedure. Local anesthesia was provided
with 1% Lidocaine.

A 17 gauge trocar needle was advanced into the right lobe of the
liver. Three separate coaxial 18 gauge core biopsy samples were
obtained and submitted in formalin. Gel-Foam pledgets were advanced
through the outer needle as the needle was retracted and removed.
Additional ultrasound was performed.

COMPLICATIONS:
None immediate.
FINDINGS: Solid core biopsy samples were obtained from the liver parenchyma.
There were no immediate bleeding complications.
IMPRESSION: Ultrasound-guided core biopsy performed of the liver within the
right lobe parenchyma.

## 2020-05-11 IMAGING — MR MR LUMBAR SPINE WO/W CM
6 of 7 series · 31 of 48 positions shown · IV contrast (gadavist)
Comparison: [DATE].

CLINICAL DATA: bone lesion on MRI abd

EXAM:
MRI LUMBAR SPINE WITHOUT AND WITH CONTRAST
TECHNIQUE: Multiplanar and multiecho pulse sequences of the lumbar spine were
obtained without and with intravenous contrast.
CONTRAST:  6mL GADAVIST GADOBUTROL 1 MMOL/ML IV SOLN

[Series 5: T2 · sagittal · 4.0mm · 0.81mm/px · 4 of 17 slices shown (1 of 2)]
[im 1/17]
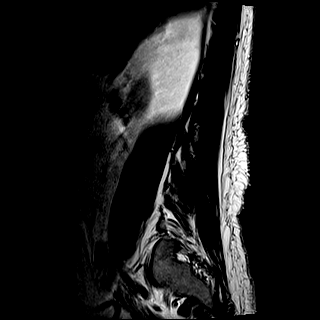
[im 6/17]
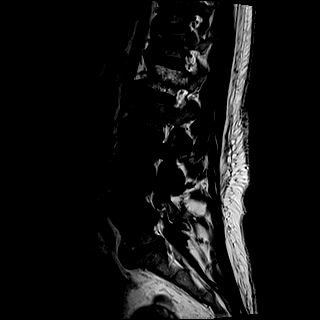
[im 11/17]
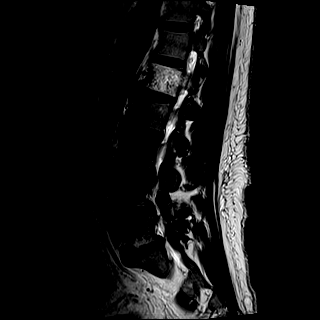
[im 17/17]
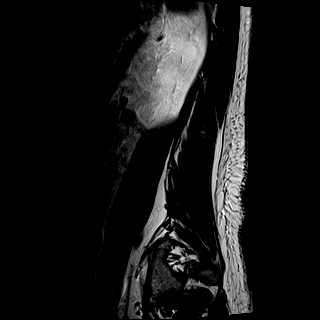

[Series 6: T1 · sagittal · 4.0mm · 0.81mm/px · 4 of 17 slices shown (1 of 2)]
[im 1/17]
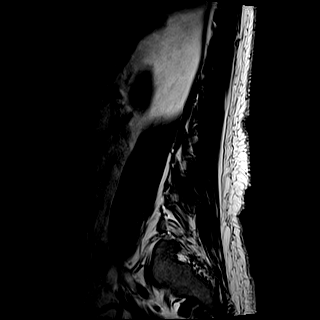
[im 6/17]
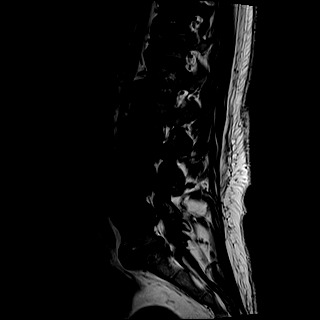
[im 11/17]
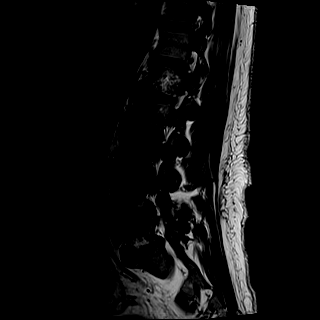
[im 17/17]
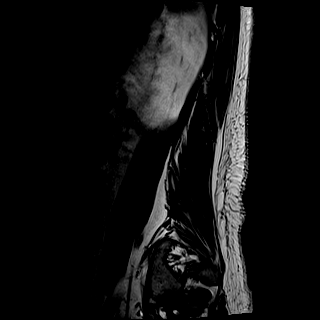

[Series 7: STIR · sagittal · 4.0mm · 0.41mm/px · 2 of 17 slices shown]
[im 1/17]
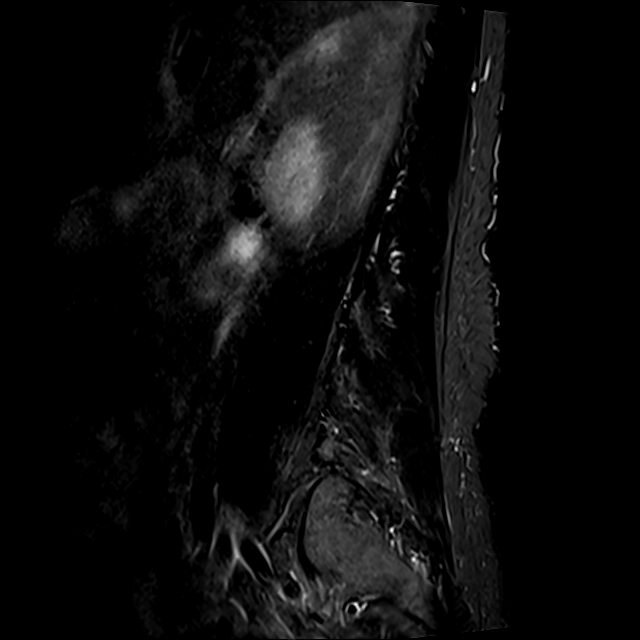
[im 5/17]
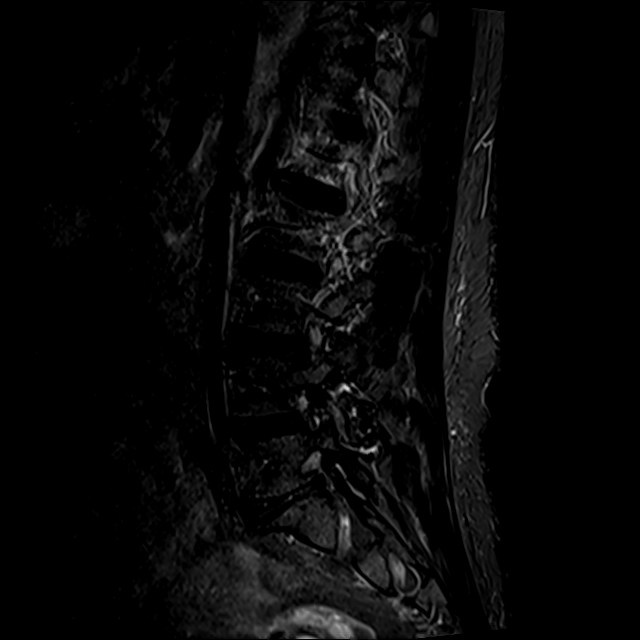

[Series 8: T2 · axial · 4.0mm · 0.78mm/px · z∈[-138,+69]mm · 8 of 36 slices shown (2 of 2)]
[im 1/36]
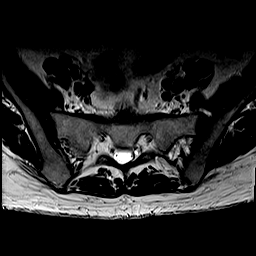
[im 4/36]
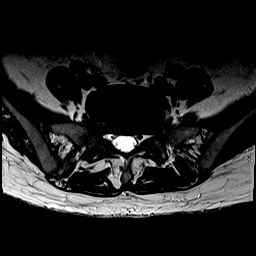
[im 12/36]
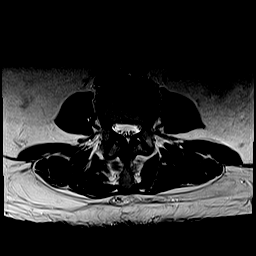
[im 16/36]
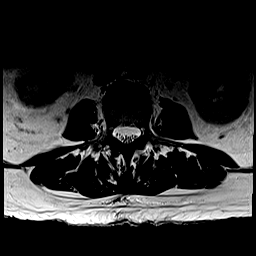
[im 20/36]
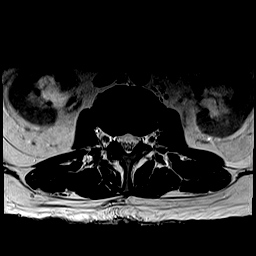
[im 24/36]
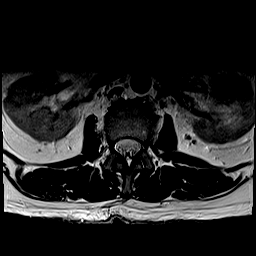
[im 32/36]
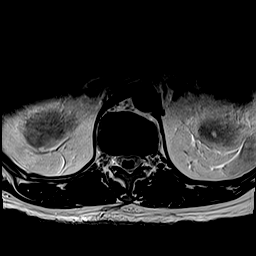
[im 36/36]
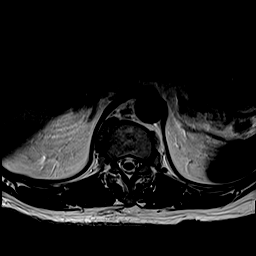

[Series 9: T1 · axial · 4.0mm · 0.39mm/px · z∈[-138,+69]mm · 8 of 36 slices shown (2 of 2)]
[im 1/36]
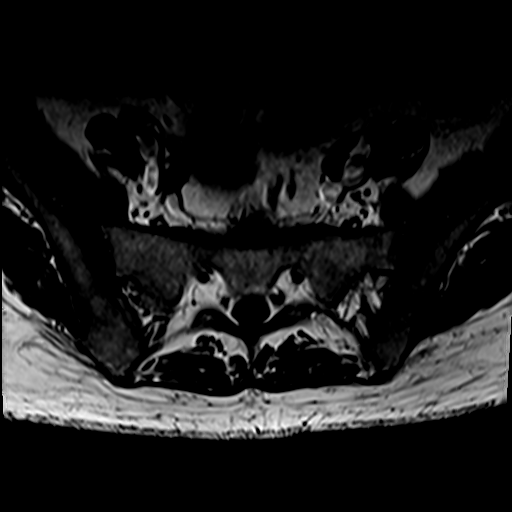
[im 4/36]
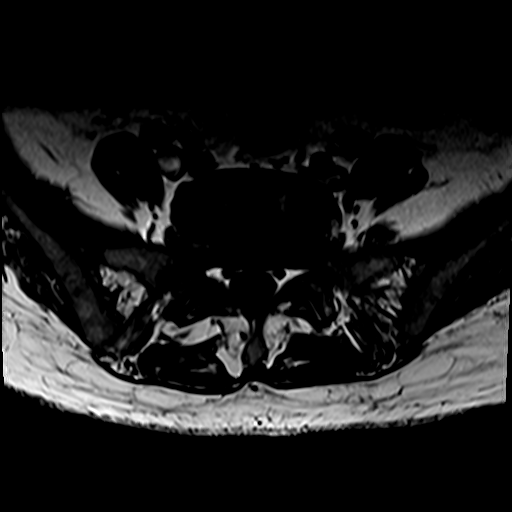
[im 12/36]
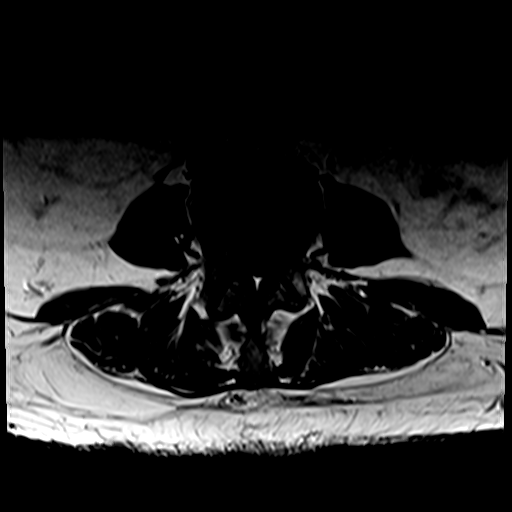
[im 16/36]
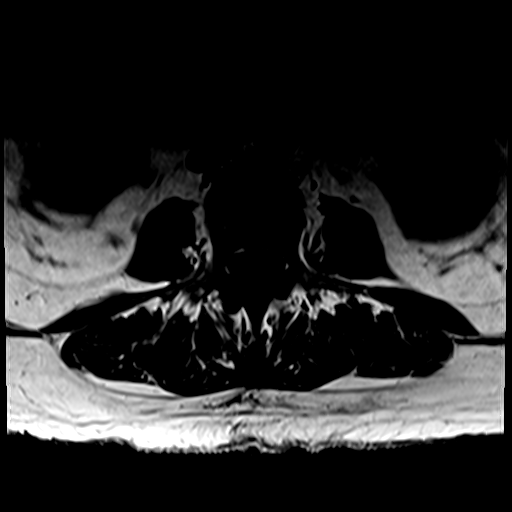
[im 20/36]
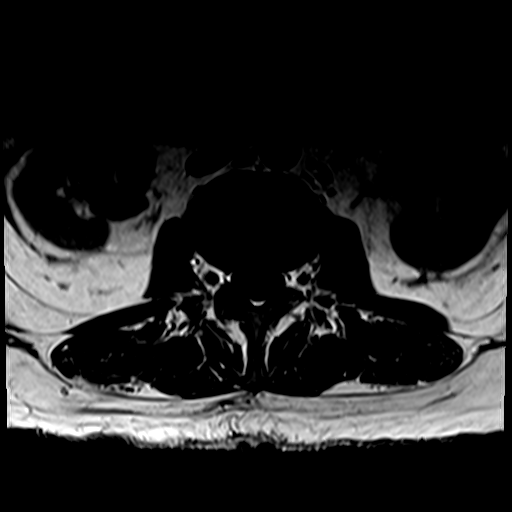
[im 24/36]
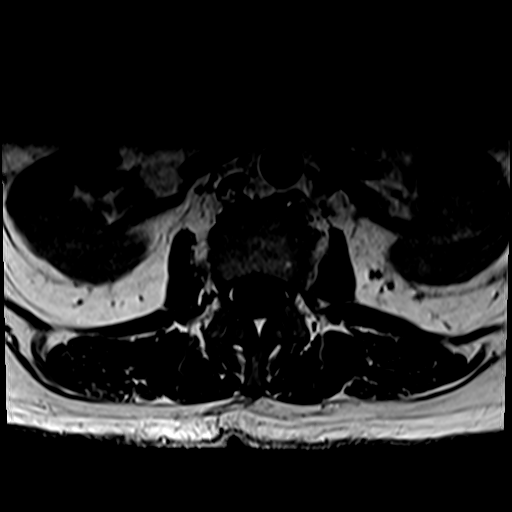
[im 32/36]
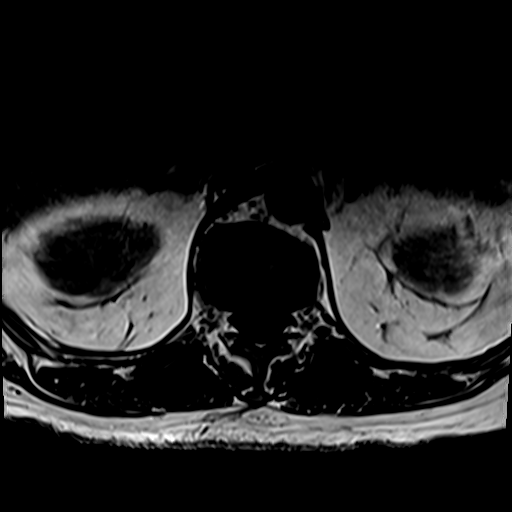
[im 36/36]
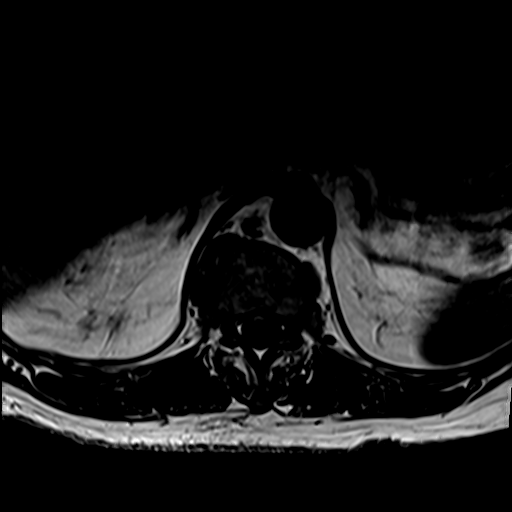

[Series 10: T1 fat-sat post-contrast · sagittal · 4.0mm · 0.81mm/px · 5 of 17 slices shown]
[im 1/17]
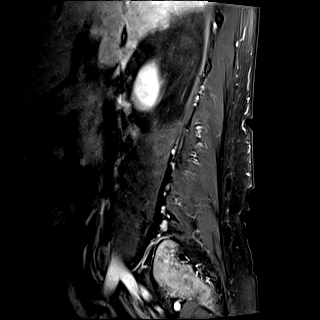
[im 5/17]
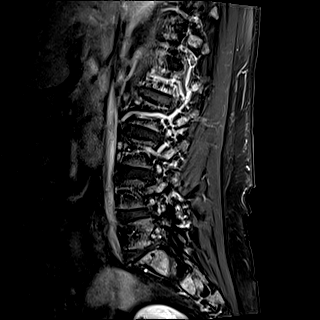
[im 9/17]
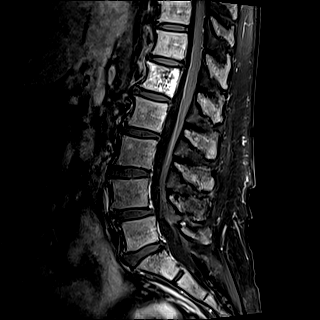
[im 13/17]
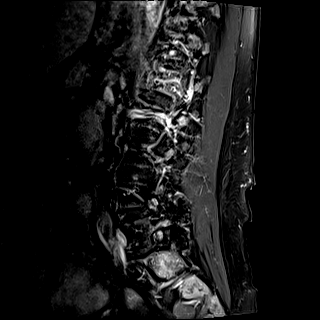
[im 17/17]
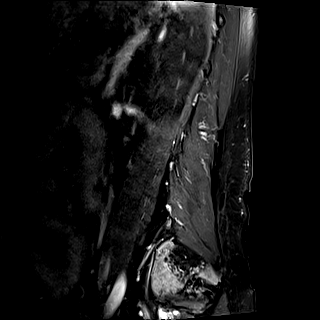

[31 of 48 positions shown; findings below may reference images not displayed]

FINDINGS: Segmentation:  Standard.

Alignment:  Minimal grade 1 L4-5 anterolisthesis.

Vertebrae: Vertebral body heights are preserved. Mild Modic type 2
endplate degenerative changes. T1/T2 heterogenous signal involving
the entirety of the L1 vertebral body with associated STIR
hyperintensity and heterogenous enhancement. No other lesion
identified.

Conus medullaris and cauda equina: Conus extends to the L2 level.
Conus and cauda equina appear normal.

Disc levels: Multilevel desiccation.

T12-L1: Small central protrusion. Patent spinal canal and neural
foramen.

L1-2: Minimal disc bulge and bilateral facet degenerative spurring.
Patent spinal canal and neural foramen.

L2-3: Minimal disc bulge with superimposed bilateral extraforaminal
protrusions. Facet degenerative spurring. Patent spinal canal and
neural foramen.

L3-4: Disc bulge with superimposed left foraminal protrusion. Facet
degenerative spurring. Patent spinal canal. Mild bilateral neural
foraminal narrowing.

L4-5: Uncovered bulge with small superimposed left subarticular
protrusion. Bilateral facet hypertrophy and ligamentum flavum
thickening. Mild spinal canal and bilateral neural foraminal
narrowing.

L5-S1: Disc bulge with shallow central protrusion. Bilateral facet
degenerative spurring. Patent spinal canal. Mild bilateral neural
foraminal narrowing.

Paraspinal and other soft tissues: Negative.
IMPRESSION: L1 vertebral body lesion likely reflects an atypical hemangioma. If
suspicion persists short-term interval follow-up in 8-12 weeks with
postcontrast MRI may be considered.

Otherwise no aggressive osseous lesion.

Multilevel spondylosis as detailed above.

## 2020-05-11 MED ORDER — MIDAZOLAM HCL 2 MG/2ML IJ SOLN
INTRAMUSCULAR | Status: AC | PRN
  Administered 2020-05-11 (×2): 1 mg via INTRAVENOUS

## 2020-05-11 MED ORDER — HYDROCODONE-ACETAMINOPHEN 5-325 MG PO TABS: 1.0000 | ORAL_TABLET | Freq: Once | ORAL | Status: AC

## 2020-05-11 MED ORDER — SODIUM CHLORIDE 0.9 % IV SOLN: INTRAVENOUS | Status: DC

## 2020-05-11 MED ORDER — MIDAZOLAM HCL 2 MG/2ML IJ SOLN
INTRAMUSCULAR | Status: AC
  Filled 2020-05-11: qty 2

## 2020-05-11 MED ORDER — ALUM & MAG HYDROXIDE-SIMETH 200-200-20 MG/5ML PO SUSP: 30.0000 mL | Freq: Once | ORAL | Status: AC

## 2020-05-11 MED ORDER — FENTANYL CITRATE (PF) 100 MCG/2ML IJ SOLN
INTRAMUSCULAR | Status: AC | PRN
  Administered 2020-05-11 (×2): 50 ug via INTRAVENOUS

## 2020-05-11 MED ORDER — HYDROCODONE-ACETAMINOPHEN 5-325 MG PO TABS
ORAL_TABLET | ORAL | Status: AC
  Administered 2020-05-11: 1 via ORAL
  Filled 2020-05-11: qty 1

## 2020-05-11 MED ORDER — FENTANYL CITRATE (PF) 100 MCG/2ML IJ SOLN
INTRAMUSCULAR | Status: AC
  Filled 2020-05-11: qty 2

## 2020-05-11 MED ORDER — ALUM & MAG HYDROXIDE-SIMETH 200-200-20 MG/5ML PO SUSP
ORAL | Status: AC
  Administered 2020-05-11: 30 mL via ORAL
  Filled 2020-05-11: qty 30

## 2020-05-11 MED ORDER — GADOBUTROL 1 MMOL/ML IV SOLN
6.0000 mL | Freq: Once | INTRAVENOUS | Status: AC | PRN
  Administered 2020-05-11: 6 mL via INTRAVENOUS

## 2020-05-11 NOTE — Procedures (Signed)
Interventional Radiology Procedure Note  Procedure: US Guided Biopsy of Liver  Complications: None  Estimated Blood Loss: < 10 mL  Findings: 18 G core biopsy of right lobe of liver performed under US guidance.  Three core samples obtained and sent to Pathology.  Sharea Guinther T. Babygirl Trager, M.D Pager:  [phone]   

## 2020-05-11 NOTE — H&P (Signed)
Chief Complaint: Patient was seen in consultation today for liver biopsy at the request of Croley,[place] M  Referring Physician(s): Croley,[place] M  Patient Status: [facility] - Out-pt  History of Present Illness: Nancy Rodriguez is a 75 y.o. female with persistent abnormal liver function tests including elevated alk phos and transaminases. History of CKD, anemia and MGUS. Followed by Dr. Tasia Catchings for MGUS. Currently asymptomatic.  Past Medical History:  Diagnosis Date  . Anemia    Anemia in chronic kidney disease  . Chronic kidney disease    Stage 3b chronic kidney disease  . Diabetes mellitus without complication ([place])   . Hypercholesterolemia   . Hypertension   . MGUS (monoclonal gammopathy of unknown significance)   . Osteoarthritis   . Rheumatoid arthritis [facility])     Past Surgical History:  Procedure Laterality Date  . HERNIA REPAIR    . PARATHYROIDECTOMY      Allergies: Benazepril, Lisinopril, Tolmetin, and Nsaids  Medications: Prior to Admission medications   Medication Sig Start Date End Date Taking? Authorizing Provider  amLODipine (NORVASC) 5 MG tablet Take by mouth. 11/26/19 11/25/20 Yes [provider]  aspirin EC 81 MG tablet Take 81 mg by mouth daily.   Yes [provider]  metoprolol succinate (TOPROL-XL) 50 MG 24 hr tablet TAKE 1 TABLET (50 MG TOTAL) BY MOUTH DAILY IN THE MORNING 11/01/14  Yes [provider]  albuterol (VENTOLIN HFA) 108 (90 Base) MCG/ACT inhaler Inhale 2 puffs into the lungs every 4 (four) hours as needed.  06/21/15   [provider]  canagliflozin (INVOKANA) 100 MG TABS tablet Take by mouth. Patient not taking: No sig reported 09/09/19 09/08/20  [provider]  diazepam (VALIUM) 5 MG tablet Take 1 tablet (5 mg total) by mouth every 8 (eight) hours as needed for muscle spasms. Patient not taking: No sig reported 12/01/14   Earleen Newport, MD  docusate sodium (COLACE) 100 MG capsule Take 1  capsule by mouth as needed.    [provider]  EPINEPHrine 0.3 mg/0.3 mL IJ SOAJ injection as needed Patient not taking: Reported on 05/06/2020 05/06/09   [provider]  ferrous sulfate 325 (65 FE) MG EC tablet TAKE 1 TABLET BY MOUTH EVERY DAY 02/17/20   [name], MD  glipiZIDE (GLUCOTROL XL) 10 MG 24 hr tablet Take by mouth. 1 QAM 1 QPM Patient not taking: Reported on 05/11/2020 07/27/19   [provider]  potassium chloride SA (KLOR-CON) 20 MEQ tablet Take 20 mEq by mouth daily.  Patient not taking: Reported on 05/11/2020 05/30/18   [provider]  prednisoLONE acetate (PRED FORTE) 1 % ophthalmic suspension Place 1 drop into both eyes daily.  07/21/12   [provider]  rosuvastatin (CRESTOR) 20 MG tablet Take 20 mg by mouth daily.  Patient not taking: Reported on 05/06/2020 06/04/11   [provider]     Family History  Problem Relation Age of Onset  . Diabetes Daughter   . Diabetes Son     Social History   Socioeconomic History  . Marital status: Widowed    Spouse name: Not on file  . Number of children: Not on file  . Years of education: Not on file  . Highest education level: Not on file  Occupational History  . Not on file  Tobacco Use  . Smoking status: Never Smoker  . Smokeless tobacco: Never Used  Substance and Sexual Activity  . Alcohol use: No  . Drug use:  Never  . Sexual activity: Not on file  Other Topics Concern  . Not on file  Social History Narrative  . Not on file   Social Determinants of Health   Financial Resource Strain: Not on file  Food Insecurity: Not on file  Transportation Needs: Not on file  Physical Activity: Not on file  Stress: Not on file  Social Connections: Not on file    Review of Systems: A 12 point ROS discussed and pertinent positives are indicated in the HPI above.  All other systems are negative.  Review of Systems  Constitutional: Negative.   Respiratory: Negative.    Cardiovascular: Negative.   Gastrointestinal: Negative.   Genitourinary: Negative.   Musculoskeletal: Negative.   Neurological: Negative.     Vital Signs: BP (!) 153/80   Pulse 78   Temp 98.4 F (36.9 C) (Oral)   Ht 5' 3.5" (1.613 m)   Wt 66.7 kg   SpO2 97%   BMI 25.63 kg/m   Physical Exam Vitals reviewed.  Constitutional:      General: She is not in acute distress.    Appearance: Normal appearance. She is not ill-appearing, toxic-appearing or diaphoretic.  HENT:     Head: Normocephalic and atraumatic.  Cardiovascular:     Rate and Rhythm: Normal rate and regular rhythm.     Heart sounds: Normal heart sounds. No murmur heard. No gallop.   Pulmonary:     Effort: Pulmonary effort is normal. No respiratory distress.     Breath sounds: Normal breath sounds. No stridor. No wheezing, rhonchi or rales.  Abdominal:     General: There is no distension.     Palpations: Abdomen is soft. There is no mass.     Tenderness: There is no abdominal tenderness. There is no guarding or rebound.  Musculoskeletal:        General: No swelling.  Skin:    General: Skin is warm and dry.  Neurological:     General: No focal deficit present.     Mental Status: She is alert and oriented to person, place, and time.     Imaging: No results found.  Labs:  CBC: Recent Labs    09/02/19 1608 03/16/20 1124 05/11/20 0949  WBC 7.1 6.5 6.7  HGB 12.2 12.0 12.4  HCT 37.4 36.9 36.5  PLT 459* 395 430*    COAGS: Recent Labs    03/23/20 1123 05/11/20 0949  INR 1.1 1.1  APTT 34  --     BMP: Recent Labs    09/02/19 1608 03/16/20 1124  NA 138 135  K 3.6 3.8  CL 100 100  CO2 29 24  GLUCOSE 216* 278*  BUN 24* 25*  CALCIUM 9.4 9.7  CREATININE 1.15* 1.37*  GFRNONAA 47* 40*  GFRAA 54*  --     LIVER FUNCTION TESTS: Recent Labs    09/02/19 1608 03/16/20 1124  BILITOT 1.1 1.0  AST 31 66*  ALT 27 55*  ALKPHOS 170* 520*  PROT 8.4* 7.7  ALBUMIN 3.9 3.1*     Assessment  and Plan:  For US guided random core biopsy of liver today. Risks and benefits of liver biopsy was discussed with the patient and/or patient's family including, but not limited to bleeding, infection, damage to adjacent structures or low yield requiring additional tests. All of the questions were answered and there is agreement to proceed. Consent signed and in chart.  Thank you for this interesting consult.  I greatly enjoyed meeting Delayni Streed  Mormile and look forward to participating in their care.  A copy of this report was sent to the requesting provider on this date.  Electronically Signed: Azzie Roup, MD 05/11/2020, 10:35 AM     I spent a total of 15 Minutes in face to face in clinical consultation, greater than 50% of which was counseling/coordinating care for liver biopsy.

## 2020-05-11 NOTE — Discharge Instructions (Signed)
Liver Biopsy, Care After These instructions give you information on caring for yourself after your procedure. Your doctor may also give you more specific instructions. Call your doctor if you have any problems or questions after your procedure. What can I expect after the procedure? After the procedure, it is common to have:  Pain and soreness where the biopsy was done.  Bruising around the area where the biopsy was done.  Sleepiness and be tired for a few days. Follow these instructions at home: Medicines  Take over-the-counter and prescription medicines only as told by your doctor.  If you were prescribed an antibiotic medicine, take it as told by your doctor. Do not stop taking the antibiotic even if you start to feel better.  Do not take medicines such as aspirin and ibuprofen. These medicines can thin your blood. Do not take these medicines unless your doctor tells you to take them.  If you are taking prescription pain medicine, take actions to prevent or treat constipation. Your doctor may recommend that you: ? Drink enough fluid to keep your pee (urine) clear or pale yellow. ? Take over-the-counter or prescription medicines. ? Eat foods that are high in fiber, such as fresh fruits and vegetables, whole grains, and beans. ? Limit foods that are high in fat and processed sugars, such as fried and sweet foods. Caring for your cut  Follow instructions from your doctor about how to take care of your cuts from surgery (incisions). Make sure you: ? Wash your hands with soap and water before you change your bandage (dressing). If you cannot use soap and water, use hand sanitizer. ? Change your bandage as told by your doctor. ? Leave stitches (sutures), skin glue, or skin tape (adhesive) strips in place. They may need to stay in place for 2 weeks or longer. If tape strips get loose and curl up, you may trim the loose edges. Do not remove tape strips completely unless your doctor says it is  okay.  Check your cuts every day for signs of infection. Check for: ? Redness, swelling, or more pain. ? Fluid or blood. ? Pus or a bad smell. ? Warmth.  Do not take baths, swim, or use a hot tub until your doctor says it is okay to do so. Activity  Rest at home for 1-2 days or as told by your doctor. ? Avoid sitting for a long time without moving. Get up to take short walks every 1-2 hours.  Return to your normal activities as told by your doctor. Ask what activities are safe for you.  Do not do these things in the first 24 hours: ? Drive. ? Use machinery. ? Take a bath or shower.  Do not lift more than 10 pounds (4.5 kg) or play contact sports for the first 2 weeks.   General instructions  Do not drink alcohol in the first week after the procedure.  Have someone stay with you for at least 24 hours after the procedure.  Get your test results. Ask your doctor or the department that is doing the test: ? When will my results be ready? ? How will I get my results? ? What are my treatment options? ? What other tests do I need? ? What are my next steps?  Keep all follow-up visits as told by your doctor. This is important.   Contact a doctor if:  A cut bleeds and leaves more than just a small spot of blood.  A cut is red,  puffs up (swells), or hurts more than before.  Fluid or something else comes from a cut.  A cut smells bad.  You have a fever or chills. Get help right away if:  You have swelling, bloating, or pain in your belly (abdomen).  You get dizzy or faint.  You have a rash.  You feel sick to your stomach (nauseous) or throw up (vomit).  You have trouble breathing, feel short of breath, or feel faint.  Your chest hurts.  You have problems talking or seeing.  You have trouble with your balance or moving your arms or legs. Summary  After the procedure, it is common to have pain, soreness, bruising, and tiredness.  Your doctor will tell you how to  take care of yourself at home. Change your bandage, take your medicines, and limit your activities as told by your doctor.  Call your doctor if you have symptoms of infection. Get help right away if your belly swells, your cut bleeds a lot, or you have trouble talking or breathing. This information is not intended to replace advice given to you by your health care provider. Make sure you discuss any questions you have with your health care provider. Document Revised: 04/11/2017 Document Reviewed: 04/12/2017 Elsevier Patient Education  2021 [employer] Moderate Conscious Sedation, Adult, Care After This sheet gives you information about how to care for yourself after your procedure. Your health care provider may also give you more specific instructions. If you have problems or questions, contact your health care provider. What can I expect after the procedure? After the procedure, it is common to have:  Sleepiness for several hours.  Impaired judgment for several hours.  Difficulty with balance.  Vomiting if you eat too soon. Follow these instructions at home: For the time period you were told by your health care provider:  Rest.  Do not participate in activities where you could fall or become injured.  Do not drive or use machinery.  Do not drink alcohol.  Do not take sleeping pills or medicines that cause drowsiness.  Do not make important decisions or sign legal documents.  Do not take care of children on your own.      Eating and drinking  Follow the diet recommended by your health care provider.  Drink enough fluid to keep your urine pale yellow.  If you vomit: ? Drink water, juice, or soup when you can drink without vomiting. ? Make sure you have little or no nausea before eating solid foods.   General instructions  Take over-the-counter and prescription medicines only as told by your health care provider.  Have a responsible adult stay with you for the time  you are told. It is important to have someone help care for you until you are awake and alert.  Do not smoke.  Keep all follow-up visits as told by your health care provider. This is important. Contact a health care provider if:  You are still sleepy or having trouble with balance after 24 hours.  You feel light-headed.  You keep feeling nauseous or you keep vomiting.  You develop a rash.  You have a fever.  You have redness or swelling around the IV site. Get help right away if:  You have trouble breathing.  You have new-onset confusion at home. Summary  After the procedure, it is common to feel sleepy, have impaired judgment, or feel nauseous if you eat too soon.  Rest after you get home. Know the things   you should not do after the procedure.  Follow the diet recommended by your health care provider and drink enough fluid to keep your urine pale yellow.  Get help right away if you have trouble breathing or new-onset confusion at home. This information is not intended to replace advice given to you by your health care provider. Make sure you discuss any questions you have with your health care provider. Document Revised: 07/31/2019 Document Reviewed: 02/26/2019 Elsevier Patient Education  2021 [employer]   

## 2020-05-12 ENCOUNTER — Telehealth: Payer: Self-pay

## 2020-05-12 NOTE — Telephone Encounter (Signed)
Patient informed of MRI results. Appt reminders mailed per pt request.

## 2020-05-12 NOTE — Telephone Encounter (Signed)
-----   Message from [name], MD sent at 05/11/2020  8:28 PM EST ----- Let her know that MRI spine showed likely benign bone lesion. Keep current follow up plan.

## 2020-05-18 ENCOUNTER — Telehealth: Payer: Self-pay | Admitting: Infectious Diseases

## 2020-05-18 LAB — SURGICAL PATHOLOGY

## 2020-05-18 NOTE — Telephone Encounter (Signed)
Hi Dr Tasia Catchings Her liver bxp shows amyloidosis. I will discuss with her but wondering if you would see her to discuss management options.  Thanks Waunita Schooner

## 2020-05-18 NOTE — Telephone Encounter (Signed)
Dr.Fitzgerald,  I saw her biopsy result this afternoon. My team will reach out to her for an appointment to go over results and management plan.   Thanks for the update.   Talbert Cage

## 2020-05-19 ENCOUNTER — Telehealth: Payer: Self-pay

## 2020-05-19 NOTE — Telephone Encounter (Signed)
Patient has been scheduled to see MD on 05/20/20

## 2020-05-19 NOTE — Telephone Encounter (Signed)
-----   Message from Vanice Sarah, [place] sent at 05/19/2020  8:47 AM EST ----- Request sent to scheduling pool ----- Message ----- From: [name], MD Sent: 05/18/2020   8:34 PM EST To: Evelina Dun, RN, Vanice Sarah, CMA  Liver biopsy is resulted. Please reach out to her to see me ASAP to go over liver biopsy results. MD visit

## 2020-05-20 ENCOUNTER — Encounter: Payer: Self-pay | Admitting: Oncology

## 2020-05-20 ENCOUNTER — Inpatient Hospital Stay: Payer: Medicare Other

## 2020-05-20 ENCOUNTER — Other Ambulatory Visit: Payer: Self-pay

## 2020-05-20 ENCOUNTER — Telehealth: Payer: Self-pay

## 2020-05-20 ENCOUNTER — Inpatient Hospital Stay: Payer: Medicare Other | Attending: Oncology | Admitting: Oncology

## 2020-05-20 VITALS — BP 143/83 | HR 84 | Temp 97.7°F | Wt 146.5 lb

## 2020-05-20 DIAGNOSIS — M1990 Unspecified osteoarthritis, unspecified site: Secondary | ICD-10-CM | POA: Insufficient documentation

## 2020-05-20 DIAGNOSIS — N1832 Chronic kidney disease, stage 3b: Secondary | ICD-10-CM | POA: Diagnosis not present

## 2020-05-20 DIAGNOSIS — M47815 Spondylosis without myelopathy or radiculopathy, thoracolumbar region: Secondary | ICD-10-CM | POA: Insufficient documentation

## 2020-05-20 DIAGNOSIS — D472 Monoclonal gammopathy: Secondary | ICD-10-CM | POA: Insufficient documentation

## 2020-05-20 DIAGNOSIS — E859 Amyloidosis, unspecified: Secondary | ICD-10-CM | POA: Insufficient documentation

## 2020-05-20 DIAGNOSIS — Z833 Family history of diabetes mellitus: Secondary | ICD-10-CM | POA: Diagnosis not present

## 2020-05-20 DIAGNOSIS — Z79899 Other long term (current) drug therapy: Secondary | ICD-10-CM | POA: Insufficient documentation

## 2020-05-20 DIAGNOSIS — R7401 Elevation of levels of liver transaminase levels: Secondary | ICD-10-CM | POA: Diagnosis not present

## 2020-05-20 DIAGNOSIS — R748 Abnormal levels of other serum enzymes: Secondary | ICD-10-CM | POA: Diagnosis not present

## 2020-05-20 LAB — PROTIME-INR
INR: 1.1 (ref 0.8–1.2)
Prothrombin Time: 13.5 seconds (ref 11.4–15.2)

## 2020-05-20 LAB — TSH: TSH: 6.489 u[IU]/mL — ABNORMAL HIGH (ref 0.350–4.500)

## 2020-05-20 LAB — APTT: aPTT: 30 seconds (ref 24–36)

## 2020-05-20 LAB — TROPONIN I (HIGH SENSITIVITY): Troponin I (High Sensitivity): 19 ng/L — ABNORMAL HIGH (ref <18)

## 2020-05-20 NOTE — Progress Notes (Signed)
Pt here for liver biopsy results.

## 2020-05-20 NOTE — Telephone Encounter (Signed)
Nancy Rodriguez is scheduled for CT Bone Marrow Bx  Tues  05/24/20 @ 8:30a  Arrive 7:30a. Please schedule patient for MD follow up 2 weeks after biopsy. I will call pt with appts.

## 2020-05-20 NOTE — Telephone Encounter (Signed)
Dr. Tasia Catchings has talked with patient's daughter and patient will call back to let us know if she will go forth with biopsy.

## 2020-05-20 NOTE — Telephone Encounter (Signed)
MD f/u scheduled on 2/22 (2 weeks post 2/8 biopsy).

## 2020-05-20 NOTE — Telephone Encounter (Signed)
Per Message from IR nurse Jocelyn Lamer:  I called her just now to give instructions for BMB, she states she just can't come that day. stated "I have too much going on next week. I informed her we have a big week next week but was able to give this day to her. she has cancelled her procedure 3 times now, I suggested to her when she decides to do biopsy let your office know so we can put her on schedule  Contacted patient and she states that she would like for Dr. Tasia Catchings to contact [name] [phone]) and explain what was said in the visit, before proceeding. Dr. Tasia Catchings notified. She will add daughter to [facility] when she comes back on Monday to drop off something.

## 2020-05-20 NOTE — Progress Notes (Signed)
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  Work up include protein electrophoresis showed M protein 0.7,   Reviewed her previous medical records via care everywhere.  She was seen by Hematology Oncology at [facility] on 02/14/2017.  07/25/2007  SPEP showed M protein of 0.35, IFE showed IgG lamda 09/22/2007  Bone survey negative.  02/15/14 IgG 930, SPEP M protien 0.22, free kappa light chain 3.59, lamda 2.92, ratio 1.23  Hypercalcemia, resolved after stopping HCTZ.  # Transaminitis 03/28/20 US abdomen showed left liver lobe Complex 1.7 cm cyst  # 04/07/20 MRI abdomen w/wo contrast showed 2 benign liver cysts, 2 nonspecific hypovascular irighr liver lesions, abnormal bone marrow signal of L1 vertebral body.  Patient was  advised to proceed with bone marrow biopsy and she declined.  # referred her to established care with GI and was seen on 04/19/20,  # Liver biopsy showed amyloidosis.  INTERVAL HISTORY Nancy Rodriguez is a 75 y.o. female who has above history reviewed by me today presents for follow up visit for management of amyloidosis Problems and complaints are listed below: She feels anxious about her diagnosis. No new complaints.    Review of Systems  Constitutional: Negative for appetite change, chills, fatigue and fever.  HENT:   Positive for nosebleeds. Negative for hearing loss and voice change.   Eyes: Negative for eye  problems.  Respiratory: Negative for chest tightness and cough.   Cardiovascular: Negative for chest pain.  Gastrointestinal: Negative for abdominal distention, abdominal pain and blood in stool.  Endocrine: Negative for hot flashes.  Genitourinary: Negative for difficulty urinating and frequency.   Musculoskeletal: Negative for arthralgias.  Skin: Negative for itching and rash.  Neurological: Negative for extremity weakness.  Hematological: Negative for adenopathy.  Psychiatric/Behavioral: Negative for confusion.    MEDICAL HISTORY:  Past Medical History:  Diagnosis Date  . Anemia    Anemia in chronic kidney disease  . Chronic kidney disease    Stage 3b chronic kidney disease  . Diabetes mellitus without complication ([place])   . Hypercholesterolemia   . Hypertension   . MGUS (monoclonal gammopathy of unknown significance)   . Osteoarthritis   . Rheumatoid arthritis ([place])     SURGICAL HISTORY: Past Surgical History:  Procedure Laterality Date  . HERNIA REPAIR    . PARATHYROIDECTOMY      SOCIAL HISTORY: Social History   Socioeconomic History  . Marital status: Widowed    Spouse name: Not on file  . Number of children: Not on file  . Years of education: Not on file  . Highest education level: Not on file  Occupational History  . Not on file  Tobacco Use  . Smoking status: Never Smoker  . Smokeless tobacco: Never Used  Substance and Sexual Activity  . Alcohol use: No  . Drug use: Never  . Sexual activity: Not on file  Other Topics Concern  . Not on file  Social History Narrative  . Not on file  Social Determinants of Health   Financial Resource Strain: Not on file  Food Insecurity: Not on file  Transportation Needs: Not on file  Physical Activity: Not on file  Stress: Not on file  Social Connections: Not on file  Intimate Partner Violence: Not on file    FAMILY HISTORY: Family History  Problem Relation Age of Onset  . Diabetes Daughter   . Diabetes  Son     ALLERGIES:  is allergic to benazepril, lisinopril, tolmetin, and nsaids.  MEDICATIONS:  Current Outpatient Medications  Medication Sig Dispense Refill  . albuterol (VENTOLIN HFA) 108 (90 Base) MCG/ACT inhaler Inhale 2 puffs into the lungs every 4 (four) hours as needed.     Marland Kitchen amLODipine (NORVASC) 5 MG tablet Take by mouth.    Marland Kitchen aspirin EC 81 MG tablet Take 81 mg by mouth daily.    Marland Kitchen docusate sodium (COLACE) 100 MG capsule Take 1 capsule by mouth as needed.    . ferrous sulfate 325 (65 FE) MG EC tablet TAKE 1 TABLET BY MOUTH EVERY DAY 90 tablet 1  . glipiZIDE (GLUCOTROL XL) 10 MG 24 hr tablet Take by mouth. 1 QAM 1 QPM    . insulin glargine (LANTUS SOLOSTAR) 100 UNIT/ML Solostar Pen Inject 10 Units into the skin at bedtime.    . metoprolol succinate (TOPROL-XL) 50 MG 24 hr tablet TAKE 1 TABLET (50 MG TOTAL) BY MOUTH DAILY IN THE MORNING    . potassium chloride SA (KLOR-CON) 20 MEQ tablet Take 20 mEq by mouth daily.    . prednisoLONE acetate (PRED FORTE) 1 % ophthalmic suspension Place 1 drop into both eyes daily.     . canagliflozin (INVOKANA) 100 MG TABS tablet Take by mouth. (Patient not taking: No sig reported)    . diazepam (VALIUM) 5 MG tablet Take 1 tablet (5 mg total) by mouth every 8 (eight) hours as needed for muscle spasms. (Patient not taking: No sig reported) 20 tablet 0  . EPINEPHrine 0.3 mg/0.3 mL IJ SOAJ injection as needed (Patient not taking: No sig reported)    . rosuvastatin (CRESTOR) 20 MG tablet Take 20 mg by mouth daily.  (Patient not taking: No sig reported)     No current facility-administered medications for this visit.     PHYSICAL EXAMINATION: ECOG PERFORMANCE STATUS: 1 - Symptomatic but completely ambulatory Vitals:   05/20/20 0954  BP: (!) 143/83  Pulse: 84  Temp: 97.7 F (36.5 C)  SpO2: 96%   Filed Weights   05/20/20 0954  Weight: 146 lb 8 oz (66.5 kg)    Physical Exam Constitutional:      General: She is not in acute distress. HENT:      Head: Normocephalic and atraumatic.  Eyes:     General: No scleral icterus. Cardiovascular:     Rate and Rhythm: Normal rate and regular rhythm.     Heart sounds: Normal heart sounds.  Pulmonary:     Effort: Pulmonary effort is normal. No respiratory distress.     Breath sounds: No wheezing.  Abdominal:     General: Bowel sounds are normal. There is no distension.     Palpations: Abdomen is soft.  Musculoskeletal:        General: No deformity. Normal range of motion.     Cervical back: Normal range of motion and neck supple.  Skin:    General: Skin is warm and dry.     Findings: No erythema or rash.  Neurological:     Mental  Status: She is alert and oriented to person, place, and time. Mental status is at baseline.     Cranial Nerves: No cranial nerve deficit.     Coordination: Coordination normal.  Psychiatric:        Mood and Affect: Mood normal.     LABORATORY DATA:  I have reviewed the data as listed Lab Results  Component Value Date   WBC 6.7 05/11/2020   HGB 12.4 05/11/2020   HCT 36.5 05/11/2020   MCV 74.9 (L) 05/11/2020   PLT 430 (H) 05/11/2020   Recent Labs    09/02/19 1608 03/16/20 1124  NA 138 135  K 3.6 3.8  CL 100 100  CO2 29 24  GLUCOSE 216* 278*  BUN 24* 25*  CREATININE 1.15* 1.37*  CALCIUM 9.4 9.7  GFRNONAA 47* 40*  GFRAA 54*  --   PROT 8.4* 7.7  ALBUMIN 3.9 3.1*  AST 31 66*  ALT 27 55*  ALKPHOS 170* 520*  BILITOT 1.1 1.0   Iron/TIBC/Ferritin/ %Sat    Component Value Date/Time   IRON 45 09/02/2019 1608   TIBC 416 09/02/2019 1608   FERRITIN 35 09/02/2019 1608   IRONPCTSAT 11 09/02/2019 1608     08/25/2019, platelet count 491, WBC 7.5, hemoglobin 12 Creatinine 1.58, EGFR 37, calcium 10.8, albumin 4.2 Negative hepatitis B surface antigen, hepatitis B core antibody, Negative hepatitis C 08/05/2019, A1c 11.2   RADIOGRAPHIC STUDIES: I have personally reviewed the radiological images as listed and agreed with the findings in the  report. MR Lumbar Spine W Wo Contrast  Result Date: 05/11/2020 CLINICAL DATA:  bone lesion on MRI abd EXAM: MRI LUMBAR SPINE WITHOUT AND WITH CONTRAST TECHNIQUE: Multiplanar and multiecho pulse sequences of the lumbar spine were obtained without and with intravenous contrast. CONTRAST:  8mL GADAVIST GADOBUTROL 1 MMOL/ML IV SOLN COMPARISON:  04/07/2020. FINDINGS: Segmentation:  Standard. Alignment:  Minimal grade 1 L4-5 anterolisthesis. Vertebrae: Vertebral body heights are preserved. Mild Modic type 2 endplate degenerative changes. T1/T2 heterogenous signal involving the entirety of the L1 vertebral body with associated STIR hyperintensity and heterogenous enhancement. No other lesion identified. Conus medullaris and cauda equina: Conus extends to the L2 level. Conus and cauda equina appear normal. Disc levels: Multilevel desiccation. T12-L1: Small central protrusion. Patent spinal canal and neural foramen. L1-2: Minimal disc bulge and bilateral facet degenerative spurring. Patent spinal canal and neural foramen. L2-3: Minimal disc bulge with superimposed bilateral extraforaminal protrusions. Facet degenerative spurring. Patent spinal canal and neural foramen. L3-4: Disc bulge with superimposed left foraminal protrusion. Facet degenerative spurring. Patent spinal canal. Mild bilateral neural foraminal narrowing. L4-5: Uncovered bulge with small superimposed left subarticular protrusion. Bilateral facet hypertrophy and ligamentum flavum thickening. Mild spinal canal and bilateral neural foraminal narrowing. L5-S1: Disc bulge with shallow central protrusion. Bilateral facet degenerative spurring. Patent spinal canal. Mild bilateral neural foraminal narrowing. Paraspinal and other soft tissues: Negative. 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 If suspicion persists short-term interval follow-up in 8-12 weeks with postcontrast MRI may be considered. Otherwise no aggressive osseous  lesion. Multilevel spondylosis as detailed above. Electronically Signed   By: Primitivo Gauze M.D.   On: 05/11/2020 14:35   MR Abdomen W Wo Contrast  Result Date: 04/07/2020 CLINICAL DATA:  Elevated liver enzymes. Follow-up indeterminate cystic liver lesion seen on recent ultrasound. Monoclonal gammopathy of of unknown significance. EXAM: MRI ABDOMEN WITHOUT AND WITH CONTRAST TECHNIQUE: Multiplanar multisequence MR imaging of the abdomen was performed both before and after the administration  of intravenous contrast. CONTRAST:  26m GADAVIST GADOBUTROL 1 MMOL/ML IV SOLN COMPARISON:  Ultrasound on 03/28/2020 FINDINGS: Lower chest: No acute findings. Hepatobiliary: 2 minimally complicated cysts are seen lateral segment of the left lobe, measuring 1.5 cm and 1.2 cm in maximum diameter. Both of these lesions contain a few thin internal septations, but no evidence of thickened septations enhancing soft tissue component. A poorly defined hypovascular lesion is seen in the posterior right hepatic lobe which measures 1.4 cm on image 51/17. A probable similar but smaller lesion is seen in the anterior right lobe measuring 9 mm on image 38/17. These lesions are not seen on T2 or diffusion weighted sequences, which although nonspecific, are suggestive of benign etiology. Several tiny less than 5 mm gallstones are seen, however there is no evidence of cholecystitis or biliary ductal dilatation. Pancreas:  No mass or inflammatory changes. Spleen:  Within normal limits in size and appearance. Adrenals/Urinary Tract: No masses identified. A few small renal cysts noted bilaterally. No evidence of hydronephrosis. Stomach/Bowel: Visualized portion unremarkable. Vascular/Lymphatic: No pathologically enlarged lymph nodes identified. No abdominal aortic aneurysm. Other:  None. Musculoskeletal: Diffuse T2 hyperintensity and abnormal contrast enhancement are seen throughout the L1 vertebral body, raising suspicion for multiple  myeloma in this patient with known MGUS. IMPRESSION: Two benign cystic lesions in left hepatic lobe, which correspond with the lesions seen on recent ultrasound. 2 nonspecific hypovascular lesions in the right hepatic lobe, largest measuring 1.4 cm. Recommend continued follow-up by MRI in 6 months. Abnormal bone marrow signal throughout the L1 vertebral body, raising suspicion for multiple myeloma in this patient with known MGUS. Consider dedicated lumbar spine MRI without and with contrast or PET-CT for further evaluation. Cholelithiasis. No radiographic evidence of cholecystitis or biliary ductal dilatation. Electronically Signed   By: JMarlaine HindM.D.   On: 04/07/2020 11:03   UK[place]BIOPSY (LIVER)  Result Date: 05/11/2020 INDICATION: Elevated alkaline phosphatase and liver transaminase levels. EXAM: ULTRASOUND GUIDED CORE BIOPSY OF LIVER MEDICATIONS: None. ANESTHESIA/SEDATION: Fentanyl 100 mcg IV; Versed 2.0 mg IV Moderate Sedation Time:  15 minutes. The patient was continuously monitored during the procedure by the interventional radiology nurse under my direct supervision. PROCEDURE: The procedure, risks, benefits, and alternatives were explained to the patient. Questions regarding the procedure were encouraged and answered. The patient understands and consents to the procedure. A time-out was performed prior to initiating the procedure. The liver was localized by ultrasound. The abdominal wall was prepped with chlorhexidine in a sterile fashion, and a sterile drape was applied covering the operative field. A sterile gown and sterile gloves were used for the procedure. Local anesthesia was provided with 1% Lidocaine. A 17 gauge trocar needle was advanced into the right lobe of the liver. Three separate coaxial 18 gauge core biopsy samples were obtained and submitted in formalin. Gel-Foam pledgets were advanced through the outer needle as the needle was retracted and removed. Additional ultrasound was  performed. COMPLICATIONS: None immediate. FINDINGS: Solid core biopsy samples were obtained from the liver parenchyma. There were no immediate bleeding complications. IMPRESSION: Ultrasound-guided core biopsy performed of the liver within the right lobe parenchyma. Electronically Signed   By: GAletta EdouardM.D.   On: 05/11/2020 11:38   DG Bone Survey Met  Result Date: 03/24/2020 CLINICAL DATA:  Monoclonal gammopathy of unknown significance. Abnormal SPEP. EXAM: METASTATIC BONE SURVEY COMPARISON:  None. FINDINGS: Slightly heterogeneous appearance of the calvarium involving the frontal and parietal bones without discrete focal lesion. There is no evidence  of discrete lytic or blastic lesion in the axial or appendicular skeleton. Mild multifocal osteoarthritis. Diffuse spondylosis of the thoracic and lumbar spine. No spinal compression fracture. Mild elevation of the right hemidiaphragm with adjacent atelectasis/scarring. IMPRESSION: 1. Slightly heterogeneous appearance of the calvarium in the frontal and parietal bones is nonspecific. This can be seen in the setting of osteopenia. Consider further evaluation with head CT based on clinical concern. No discrete calvarial lesion is seen. 2. No focal bone lesion in the visualized skeleton. 3. Mild multifocal osteoarthritis. Diffuse thoracolumbar spondylosis. Electronically Signed   By: Keith Rake M.D.   On: 03/24/2020 14:50   US Abdomen Limited RUQ (LIVER/GB)  Result Date: 03/28/2020 CLINICAL DATA:  Elevated liver enzymes EXAM: ULTRASOUND ABDOMEN LIMITED RIGHT UPPER QUADRANT COMPARISON:  None. FINDINGS: Gallbladder: Multiple mobile echogenic, shadowing gallstones within the gallbladder lumen measuring up to 4 mm in diameter. No pericholecystic fluid or wall thickening visualized. No sonographic Murphy sign noted by sonographer. Common bile duct: Diameter: 5 mm. Liver: Complex cyst with internal septations within the anterior aspect of the left hepatic  lobe measuring 1.7 x 1.4 x 1.6 cm. Within normal limits in parenchymal echogenicity. Portal vein is patent on color Doppler imaging with normal direction of blood flow towards the liver. Other: None. IMPRESSION: 1. Complex 1.7 cm cyst within the anterior left hepatic lobe with internal septations. Comparison with prior outside cross-sectional imaging of the abdomen, if available, is recommended to assess stability. If there is no outside imaging available, a follow-up contrast enhanced MRI of the abdomen could be performed for further characterization. 2. Cholelithiasis without sonographic evidence of acute cholecystitis. Electronically Signed   By: Davina Poke D.O.   On: 03/28/2020 15:13      ASSESSMENT & PLAN:  1. Amyloidosis, unspecified type ([place])   2. MGUS (monoclonal gammopathy of unknown significance)   3. Transaminitis    # Liver amyloidosis Pathology was discussed with patient. I explained to her that Amyloidosis is abnormal protein desposition to her organs and maybe associated with blood cancer such as plasma neoplasm.  I discussed with pathologist Dr.Kraynie, who will send mass spectrometry to identify amyloidosis type.  She has IgG lamda MGUS, probaby AL-amyloidosis.  NT proBNP was checked previously was normal, I will recheck.  Recommend bone marrow biopsy 2D Echo, TSH, PT, PTT,  Factor X, Troponin I , 24 hour creatinine clearance and protein.   Patient initially agreed with the plan and later cancelled bone marrow biopsy schedule. RN called patient and also talked to daughter who would like to talk to me further.  I called daughter [name] and updated her about my recommendations and answered all questions.  [name] will further discuss with patient and call back next week to update patient's decision.  Orders Placed This Encounter  Procedures  . Miscellaneous [facility] test (send-out)    Standing Status:   Future    Number of Occurrences:   1    Standing Expiration Date:    05/20/2021    Order Specific Question:   Test name / description:    Answer:   NT-proBNP [facility] [id number]  . TSH    Standing Status:   Future    Number of Occurrences:   1    Standing Expiration Date:   05/20/2021  . Factor X assay    Standing Status:   Future    Number of Occurrences:   1    Standing Expiration Date:   05/20/2021  . Protime-INR  Standing Status:   Future    Number of Occurrences:   1    Standing Expiration Date:   05/20/2021  . APTT    Standing Status:   Future    Number of Occurrences:   1    Standing Expiration Date:   05/20/2021  . Protein, urine, 24 hour    Standing Status:   Future    Standing Expiration Date:   05/20/2021  . Creatinine clearance, urine, 24 hour    Standing Status:   Future    Standing Expiration Date:   05/20/2021  . Draw serum tube    Standing Status:   Future    Number of Occurrences:   1    Standing Expiration Date:   05/20/2021  . T4, free    Standing Status:   Future    Standing Expiration Date:   05/20/2021  . ECHOCARDIOGRAM LIMITED    Standing Status:   Future    Standing Expiration Date:   05/20/2021    Order Specific Question:   Where should this test be performed    Answer:   [facility]    Order Specific Question:   Perflutren DEFINITY (image enhancing agent) should be administered unless hypersensitivity or allergy exist    Answer:   Administer Perflutren    Order Specific Question:   Reason for exam-Echo    Answer:   Other-Full Diagnosis List    Order Specific Question:   Full ICD-10/Reason for Exam    Answer:   Amyloidosis ([place]) [902284]    All questions were answered. The patient knows to call the clinic with any problems questions or concerns.  cc Leonel Ramsay, MD  We spent sufficient time to discuss many aspect of care, questions were answered to patient's satisfaction. A total of 45 minutes was spent on this visit.  With 5 minutes spent reviewing image findings, pathology reports, 20 minutes counseling the patient on  the diagnosis, work up plan.  Additional 20 minutes was spent on called patient's daughter to update the recommendation and answer questions.   [name], MD, PhD Hematology Oncology [facility] at [facility] Pager- [phone] 05/20/2020

## 2020-05-21 LAB — MISC LABCORP TEST (SEND OUT): Labcorp test code: 143000

## 2020-05-21 LAB — FACTOR 10 ASSAY: Factor X Activity: 96 % (ref 76–183)

## 2020-05-22 DIAGNOSIS — D472 Monoclonal gammopathy: Secondary | ICD-10-CM | POA: Diagnosis not present

## 2020-05-23 ENCOUNTER — Other Ambulatory Visit: Payer: Self-pay

## 2020-05-23 DIAGNOSIS — E859 Amyloidosis, unspecified: Secondary | ICD-10-CM

## 2020-05-23 LAB — PROTEIN, URINE, 24 HOUR
Collection Interval-UPROT: 24 hours
Protein, 24H Urine: 2723 mg/d — ABNORMAL HIGH (ref 50–100)
Protein, Urine: 330 mg/dL
Urine Total Volume-UPROT: 825 mL

## 2020-05-24 ENCOUNTER — Ambulatory Visit: Admission: RE | Admit: 2020-05-24 | Payer: Medicare Other | Source: Ambulatory Visit

## 2020-05-24 ENCOUNTER — Other Ambulatory Visit: Payer: Self-pay

## 2020-05-24 ENCOUNTER — Inpatient Hospital Stay: Payer: Medicare Other

## 2020-05-24 DIAGNOSIS — D472 Monoclonal gammopathy: Secondary | ICD-10-CM

## 2020-05-24 DIAGNOSIS — E859 Amyloidosis, unspecified: Secondary | ICD-10-CM

## 2020-05-24 LAB — CREATININE CLEARANCE, URINE, 24 HOUR
Collection Interval-CRCL: 24 hours
Creatinine Clearance: 37 mL/min — ABNORMAL LOW (ref 75–115)
Creatinine, 24H Ur: 536 mg/d — ABNORMAL LOW (ref 600–1800)
Creatinine, Urine: 65 mg/dL
Urine Total Volume-CRCL: 825 mL

## 2020-05-24 LAB — CREATININE, SERUM
Creatinine, Ser: 1 mg/dL (ref 0.44–1.00)
GFR, Estimated: 59 mL/min — ABNORMAL LOW (ref >60)

## 2020-05-24 LAB — T4, FREE: Free T4: 1.01 ng/dL (ref 0.61–1.12)

## 2020-05-27 ENCOUNTER — Telehealth: Payer: Self-pay | Admitting: Oncology

## 2020-05-27 NOTE — Telephone Encounter (Signed)
Per Marcie Bal is special scheduling: We are full next week so it would be the week after next. She has already canceled twice for the BMB and once for a Liver   Patient has been informed and is available for  biopsy any day the week of 2/21.

## 2020-05-27 NOTE — Telephone Encounter (Signed)
Pt son Tauni Sanks called and is requesting that the Biopsy be rescheduled. Message has been sent to specialized scheduling to r/s biopsy.

## 2020-05-27 NOTE — Telephone Encounter (Signed)
Pt son Marenda Accardi called and is requesting that the Biopsy be rescheduled. He would also like Dr. Tasia Catchings to give him a call about some concerns he has. I informed the patient that clinic was in session and someone would return his call later today.  Addendum- He has since spoken to [place], who explained the procedure so he may not need to speak with you anymore as the patient has agreed to reschedule the Bone Marrow Biopsy.

## 2020-05-31 ENCOUNTER — Telehealth: Payer: Self-pay | Admitting: Oncology

## 2020-05-31 ENCOUNTER — Other Ambulatory Visit: Payer: Self-pay | Admitting: *Deleted

## 2020-05-31 NOTE — Telephone Encounter (Signed)
CT Bone Marrow Biopsy scheduled on 06/09/20 at 8:30a Arrive 7:30a. Please schedule MD follow up 2 weeks after biopsy. I will call pt to notify her of appts.

## 2020-05-31 NOTE — Telephone Encounter (Signed)
Done.. Pt will RTC on 06/23/20 @ 10:15

## 2020-05-31 NOTE — Telephone Encounter (Signed)
Patient notified of biopsy and MD follow-up appts. Appt reminder mailed out per pt request.

## 2020-05-31 NOTE — Telephone Encounter (Signed)
Biopsy scheduled and pt notified of appt.

## 2020-05-31 NOTE — Telephone Encounter (Signed)
Called returned to patients daughter to confirm EKG on 2/16. Pt daughter had questions about the prep for the EKG and I instructed her that I did not see any listed. Pt daughter [name] also wanted to know what the prep for her Biopsy was on 2/24. I explained to the daughter that I didn't see a Biopsy scheduled yet and that the team was working on getting it rescheduled with Centralized Scheduling. She again seemed shocked that the procedure was not scheduled for 2/24. I expressed that it was probably a tentative date the team was trying to secure, but as of today no Biopsy was scheduled. I informed her that once the procedure was scheduled the team would contact them.

## 2020-06-01 ENCOUNTER — Other Ambulatory Visit: Payer: Self-pay

## 2020-06-01 ENCOUNTER — Ambulatory Visit
Admission: RE | Admit: 2020-06-01 | Discharge: 2020-06-01 | Disposition: A | Discharge status: Home / Self Care | Payer: Medicare Other | Source: Ambulatory Visit | Attending: Oncology | Admitting: Oncology

## 2020-06-01 DIAGNOSIS — I517 Cardiomegaly: Secondary | ICD-10-CM | POA: Diagnosis not present

## 2020-06-01 DIAGNOSIS — E859 Amyloidosis, unspecified: Secondary | ICD-10-CM | POA: Diagnosis not present

## 2020-06-01 LAB — ECHOCARDIOGRAM LIMITED
AR max vel: 1.62 cm2
AV Area VTI: 1.58 cm2
AV Area mean vel: 1.56 cm2
AV Mean grad: 5 mmHg
AV Peak grad: 8.2 mmHg
Ao pk vel: 1.43 m/s
Area-P 1/2: 4.54 cm2
S' Lateral: 2.44 cm

## 2020-06-01 NOTE — Progress Notes (Signed)
*  PRELIMINARY RESULTS* Echocardiogram 2D Echocardiogram has been performed.  Sherrie Sport 06/01/2020, 12:27 PM

## 2020-06-06 ENCOUNTER — Other Ambulatory Visit: Payer: Self-pay | Admitting: Oncology

## 2020-06-06 ENCOUNTER — Encounter: Payer: Self-pay | Admitting: Oncology

## 2020-06-06 ENCOUNTER — Telehealth: Payer: Self-pay | Admitting: *Deleted

## 2020-06-06 MED ORDER — TRAMADOL HCL 50 MG PO TABS: 50.0000 mg | ORAL_TABLET | Freq: Four times a day (QID) | ORAL | 0 refills | Status: DC | PRN

## 2020-06-06 NOTE — Telephone Encounter (Signed)
Patient informed of doctor response and she thanked me for letting her know that she can get prescription before her biopsy and agreed to try tylenol first

## 2020-06-06 NOTE — Telephone Encounter (Signed)
Patient called requesting a pain killer to be called in for her so that she has it when she gets her biopsy done stating that the last biopsy "almost killed me" with pain. Please advise

## 2020-06-06 NOTE — Telephone Encounter (Signed)
Let her know that I recommend tylenol 650mg  Q4-6 hours as needed. If not helping, she may use Tramadol 50mg  Q6 hours for 1-2 days. Rx sent.

## 2020-06-07 ENCOUNTER — Ambulatory Visit: Payer: Medicare Other | Admitting: Oncology

## 2020-06-08 ENCOUNTER — Other Ambulatory Visit: Payer: Self-pay | Admitting: Radiology

## 2020-06-09 ENCOUNTER — Ambulatory Visit
Admission: RE | Admit: 2020-06-09 | Discharge: 2020-06-09 | Disposition: A | Discharge status: Home / Self Care | Payer: Medicare Other | Source: Ambulatory Visit | Attending: Oncology | Admitting: Oncology

## 2020-06-09 ENCOUNTER — Other Ambulatory Visit: Payer: Self-pay

## 2020-06-09 DIAGNOSIS — D472 Monoclonal gammopathy: Secondary | ICD-10-CM | POA: Diagnosis not present

## 2020-06-09 DIAGNOSIS — Z7982 Long term (current) use of aspirin: Secondary | ICD-10-CM | POA: Insufficient documentation

## 2020-06-09 DIAGNOSIS — D75839 Thrombocytosis, unspecified: Secondary | ICD-10-CM | POA: Diagnosis not present

## 2020-06-09 DIAGNOSIS — R718 Other abnormality of red blood cells: Secondary | ICD-10-CM | POA: Diagnosis not present

## 2020-06-09 DIAGNOSIS — Z79899 Other long term (current) drug therapy: Secondary | ICD-10-CM | POA: Insufficient documentation

## 2020-06-09 DIAGNOSIS — N189 Chronic kidney disease, unspecified: Secondary | ICD-10-CM | POA: Diagnosis not present

## 2020-06-09 DIAGNOSIS — E8581 Light chain (AL) amyloidosis: Secondary | ICD-10-CM | POA: Diagnosis not present

## 2020-06-09 LAB — CBC WITH DIFFERENTIAL/PLATELET
Abs Immature Granulocytes: 0.06 10*3/uL (ref 0.00–0.07)
Basophils Absolute: 0.1 10*3/uL (ref 0.0–0.1)
Basophils Relative: 1 %
Eosinophils Absolute: 0.3 10*3/uL (ref 0.0–0.5)
Eosinophils Relative: 4 %
HCT: 36.4 % (ref 36.0–46.0)
Hemoglobin: 12.2 g/dL (ref 12.0–15.0)
Immature Granulocytes: 1 %
Lymphocytes Relative: 36 %
Lymphs Abs: 2.5 10*3/uL (ref 0.7–4.0)
MCH: 24.8 pg — ABNORMAL LOW (ref 26.0–34.0)
MCHC: 33.5 g/dL (ref 30.0–36.0)
MCV: 74 fL — ABNORMAL LOW (ref 80.0–100.0)
Monocytes Absolute: 0.6 10*3/uL (ref 0.1–1.0)
Monocytes Relative: 9 %
Neutro Abs: 3.4 10*3/uL (ref 1.7–7.7)
Neutrophils Relative %: 49 %
Platelets: 453 10*3/uL — ABNORMAL HIGH (ref 150–400)
RBC: 4.92 MIL/uL (ref 3.87–5.11)
RDW: 21.5 % — ABNORMAL HIGH (ref 11.5–15.5)
Smear Review: NORMAL
WBC: 6.9 10*3/uL (ref 4.0–10.5)
nRBC: 1.6 % — ABNORMAL HIGH (ref 0.0–0.2)

## 2020-06-09 LAB — GLUCOSE, CAPILLARY: Glucose-Capillary: 265 mg/dL — ABNORMAL HIGH (ref 70–99)

## 2020-06-09 IMAGING — CT CT BIOPSY AND ASPIRATION BONE MARROW
1 of 2 series · 11 of 14 positions shown, 14 images · non-contrast
Comparison: none

INDICATION: 74-year-old female with a history of monoclonal gammopathy referred
for bone marrow biopsy

[Series 2: i-spiral 5.0 b30f · axial · 0.58mm/px · z∈[+975,+1070]mm · 11 of 33 slices shown, 14 images]
[im 3/33  soft-tissue]
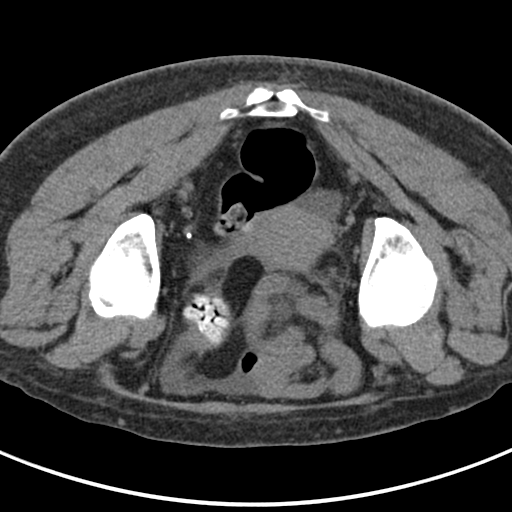
[im 3/33  bone]
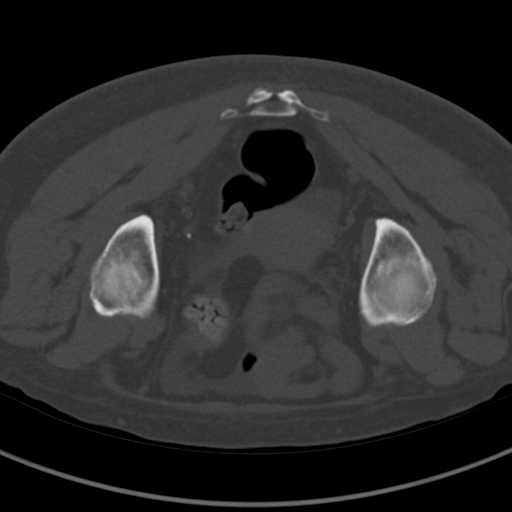
[im 6/33  bone]
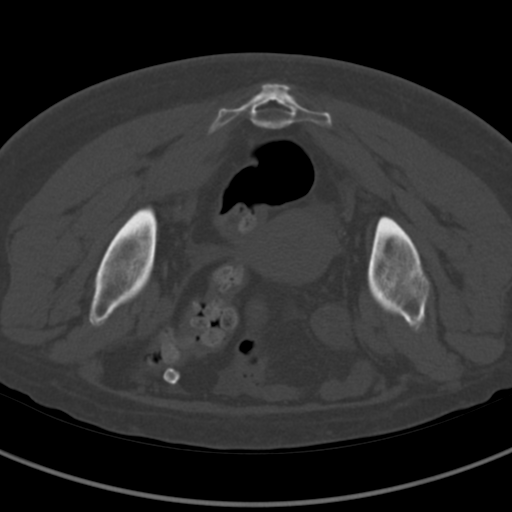
[im 9/33  bone]
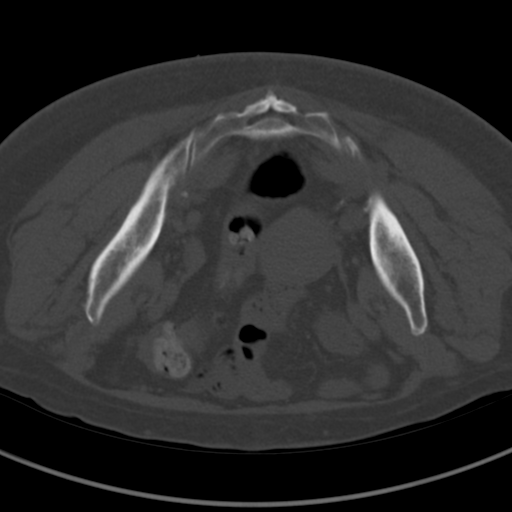
[im 11/33  bone]
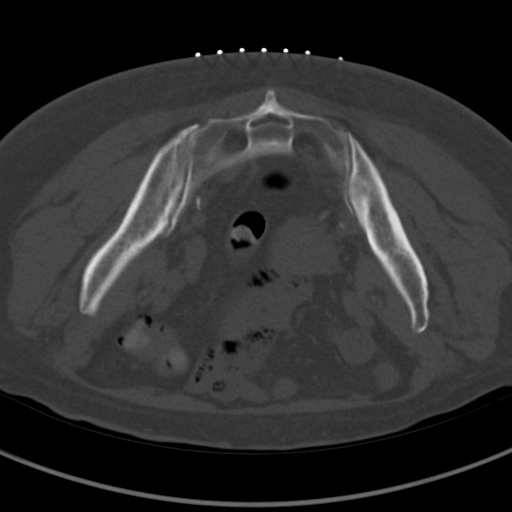
[im 14/33  soft-tissue]
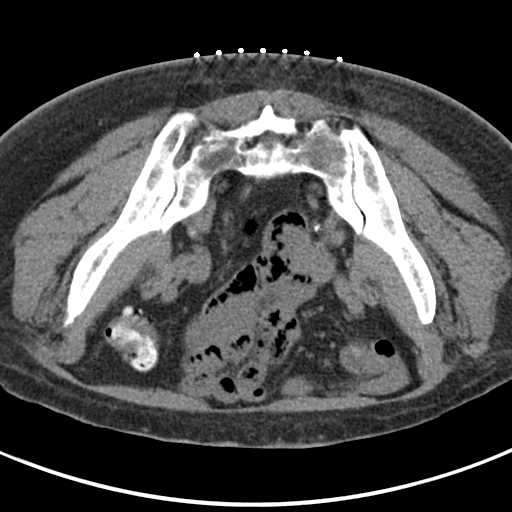
[im 14/33  bone]
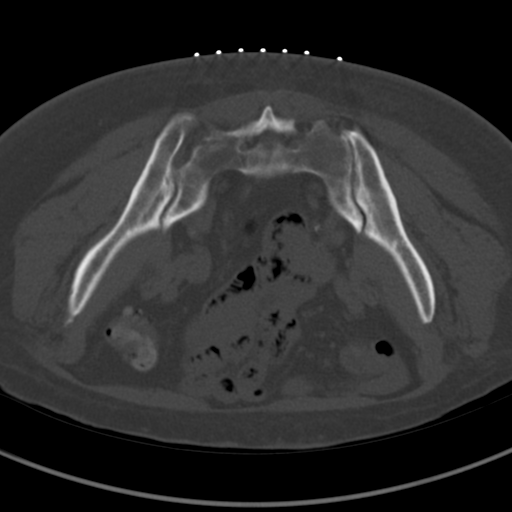
[im 17/33  bone]
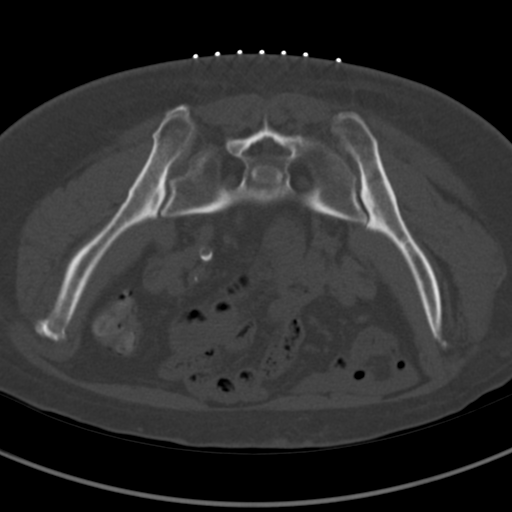
[im 19/33  bone]
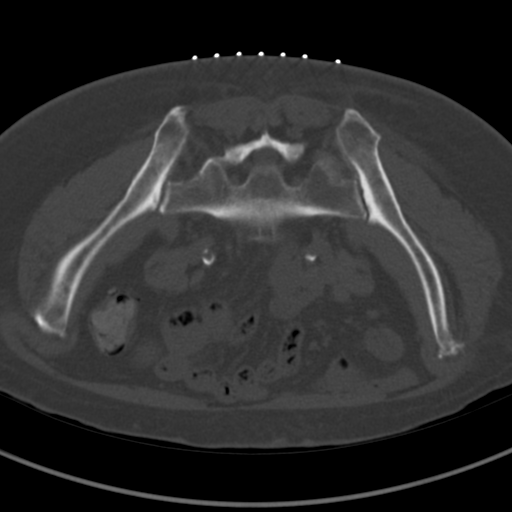
[im 22/33  bone]
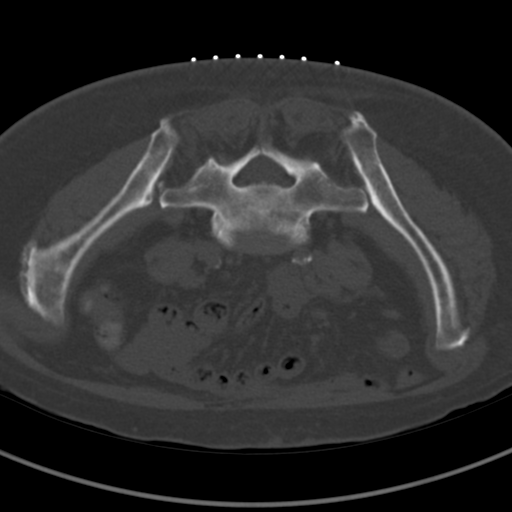
[im 25/33  soft-tissue]
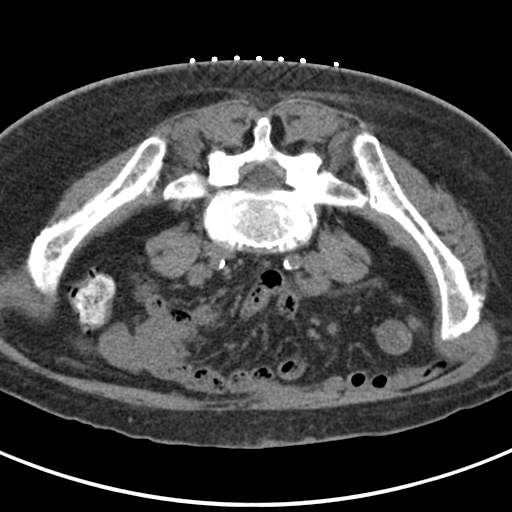
[im 25/33  bone]
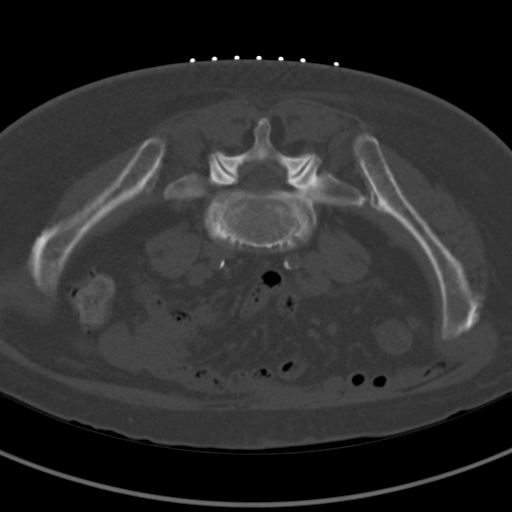
[im 27/33  bone]
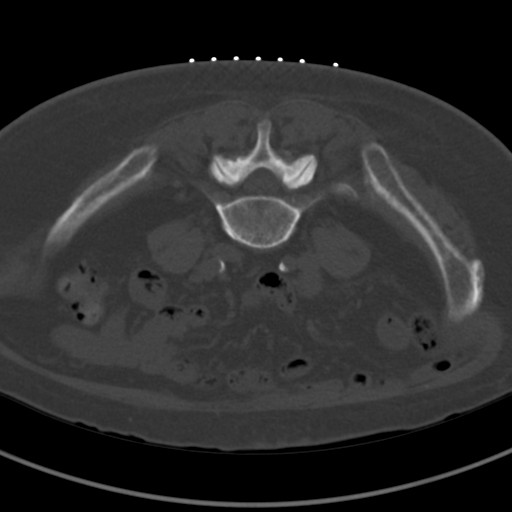
[im 30/33  bone]
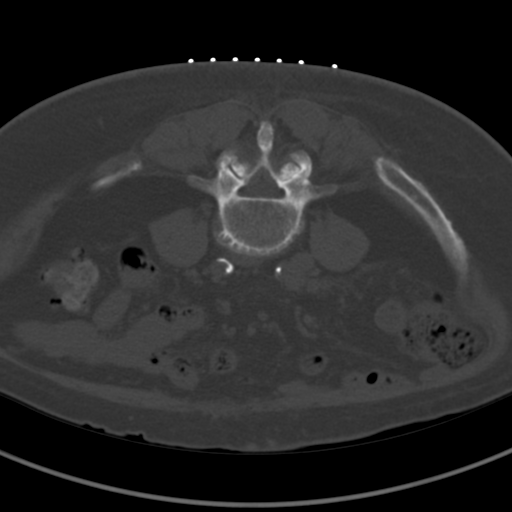

[11 of 14 positions shown; findings below may reference images not displayed]

EXAM:
CT BONE MARROW BIOPSY AND ASPIRATION

MEDICATIONS:
None.

ANESTHESIA/SEDATION:
Moderate (conscious) sedation was employed during this procedure. A
total of Versed 1.0 mg and Fentanyl 50 mcg was administered
intravenously.

Moderate Sedation Time: 10 minutes. The patient's level of
consciousness and vital signs were monitored continuously by
radiology nursing throughout the procedure under my direct
supervision.

FLUOROSCOPY TIME:  CT

COMPLICATIONS:
None

PROCEDURE:
The procedure risks, benefits, and alternatives were explained to
the patient. Questions regarding the procedure were encouraged and
answered. The patient understands and consents to the procedure.

Scout CT of the pelvis was performed for surgical planning purposes.

The right posterior pelvis was prepped with Chlorhexidine in a
sterile fashion, and a sterile drape was applied covering the
operative field. A sterile gown and sterile gloves were used for the
procedure. Local anesthesia was provided with 1% Lidocaine.

Right posterior iliac bone was targeted for biopsy. The skin and
subcutaneous tissues were infiltrated with 1% lidocaine without
epinephrine. A small stab incision was made with an 11 blade
scalpel, and an 11 gauge FORONDA needle was advanced with CT guidance
to the posterior cortex. Manual forced was used to advance the
needle through the posterior cortex and the stylet was removed. A
bone marrow aspirate was retrieved and passed to a cytotechnologist
in the room. The FORONDA needle was then advanced without the stylet
for a core biopsy. The core biopsy was retrieved and also passed to
a cytotechnologist.

Manual pressure was used for hemostasis and a sterile dressing was
placed.

No complications were encountered no significant blood loss was
encountered.

Patient tolerated the procedure well and remained hemodynamically
stable throughout.
IMPRESSION: Status post CT-guided bone marrow biopsy, with tissue specimen sent
to pathology for complete histopathologic analysis

## 2020-06-09 MED ORDER — FENTANYL CITRATE (PF) 100 MCG/2ML IJ SOLN
INTRAMUSCULAR | Status: AC | PRN
  Administered 2020-06-09: 50 ug via INTRAVENOUS

## 2020-06-09 MED ORDER — FENTANYL CITRATE (PF) 100 MCG/2ML IJ SOLN
INTRAMUSCULAR | Status: AC
  Filled 2020-06-09: qty 2

## 2020-06-09 MED ORDER — MIDAZOLAM HCL 5 MG/5ML IJ SOLN
INTRAMUSCULAR | Status: AC
  Filled 2020-06-09: qty 5

## 2020-06-09 MED ORDER — SODIUM CHLORIDE 0.9 % IV SOLN: INTRAVENOUS | Status: DC

## 2020-06-09 MED ORDER — MIDAZOLAM HCL 5 MG/5ML IJ SOLN
INTRAMUSCULAR | Status: AC | PRN
  Administered 2020-06-09: 1 mg via INTRAVENOUS

## 2020-06-09 MED ORDER — HEPARIN SOD (PORK) LOCK FLUSH 100 UNIT/ML IV SOLN
INTRAVENOUS | Status: AC
  Filled 2020-06-09: qty 5

## 2020-06-09 NOTE — H&P (Signed)
Chief Complaint: Oncology workup  Referring Physician(s): Yu,Zhou  Supervising Physician: Corrie Mckusick  Patient Status: [facility] - Out-pt  History of Present Illness: Nancy Rodriguez is a 75 y.o. female presenting for bone marrow biopsy.   She is being worked up for evaluation of monoclonal gammopathy Per Dr. Collie Siad notes: Patient was recently seen by nephrology, for hypercalcemia, acute on chronic kidney failure.  Work up include protein electrophoresis showed M protein 0.7,   She denies any new symptoms.    Past Medical History:  Diagnosis Date  . Anemia    Anemia in chronic kidney disease  . Chronic kidney disease    Stage 3b chronic kidney disease  . Diabetes mellitus without complication ([place])   . Hypercholesterolemia   . Hypertension   . MGUS (monoclonal gammopathy of unknown significance)   . Osteoarthritis   . Rheumatoid arthritis [facility])     Past Surgical History:  Procedure Laterality Date  . HERNIA REPAIR    . PARATHYROIDECTOMY      Allergies: Benazepril, Lisinopril, Tolmetin, and Nsaids  Medications: Prior to Admission medications   Medication Sig Start Date End Date Taking? Authorizing Provider  amLODipine (NORVASC) 5 MG tablet Take by mouth. 11/26/19 11/25/20 Yes [provider]  docusate sodium (COLACE) 100 MG capsule Take 1 capsule by mouth as needed.   Yes [provider]  metoprolol succinate (TOPROL-XL) 50 MG 24 hr tablet TAKE 1 TABLET (50 MG TOTAL) BY MOUTH DAILY IN THE MORNING 11/01/14  Yes [provider]  potassium chloride SA (KLOR-CON) 20 MEQ tablet Take 20 mEq by mouth daily. 05/30/18  Yes [provider]  prednisoLONE acetate (PRED FORTE) 1 % ophthalmic suspension Place 1 drop into both eyes daily.  07/21/12  Yes [provider]  albuterol (VENTOLIN HFA) 108 (90 Base) MCG/ACT inhaler Inhale 2 puffs into the lungs every 4 (four) hours as needed.  Patient not taking: Reported on 06/09/2020 06/21/15    [provider]  aspirin EC 81 MG tablet Take 81 mg by mouth daily.    [provider]  canagliflozin (INVOKANA) 100 MG TABS tablet Take by mouth. Patient not taking: No sig reported 09/09/19 09/08/20  [provider]  diazepam (VALIUM) 5 MG tablet Take 1 tablet (5 mg total) by mouth every 8 (eight) hours as needed for muscle spasms. Patient not taking: No sig reported 12/01/14   Earleen Newport, MD  EPINEPHrine 0.3 mg/0.3 mL IJ SOAJ injection as needed Patient not taking: No sig reported 05/06/09   [provider]  ferrous sulfate 325 (65 FE) MG EC tablet TAKE 1 TABLET BY MOUTH EVERY DAY 02/17/20   [name], MD  glipiZIDE (GLUCOTROL XL) 10 MG 24 hr tablet Take by mouth. 1 QAM 1 QPM 07/27/19   [provider]  insulin glargine (LANTUS SOLOSTAR) 100 UNIT/ML Solostar Pen Inject 10 Units into the skin at bedtime. 05/17/20 05/17/21  [provider]  rosuvastatin (CRESTOR) 20 MG tablet Take 20 mg by mouth daily.  Patient not taking: No sig reported 06/04/11   [provider]  traMADol (ULTRAM) 50 MG tablet Take 1 tablet (50 mg total) by mouth every 6 (six) hours as needed for severe pain. Patient not taking: Reported on 06/09/2020 06/06/20   [name], MD     Family History  Problem Relation Age of Onset  . Diabetes Daughter   . Diabetes Son     Social History   Socioeconomic History  . Marital status: Widowed  Spouse name: Not on file  . Number of children: 6  . Years of education: Not on file  . Highest education level: Not on file  Occupational History  . Not on file  Tobacco Use  . Smoking status: Never Smoker  . Smokeless tobacco: Never Used  Substance and Sexual Activity  . Alcohol use: No  . Drug use: Never  . Sexual activity: Not on file  Other Topics Concern  . Not on file  Social History Narrative  . Not on file   Social Determinants of Health   Financial Resource Strain: Not on file  Food Insecurity: Not on  file  Transportation Needs: Not on file  Physical Activity: Not on file  Stress: Not on file  Social Connections: Not on file       Review of Systems: A 12 point ROS discussed and pertinent positives are indicated in the HPI above.  All other systems are negative.  Review of Systems  Vital Signs: BP (!) 144/73   Pulse 86   Temp 98.1 F (36.7 C) (Oral)   Resp 17   Ht 5' 3.5" (1.613 m)   Wt 67.1 kg   SpO2 100%   BMI 25.81 kg/m   Physical Exam General: 75 yo female appearing stated age.  Well-developed, well-nourished.  No distress. HEENT: Atraumatic, normocephalic.  Conjugate gaze, extra-ocular motor intact. No scleral icterus or scleral injection. No lesions on external ears, nose, lips, or gums.  Oral mucosa moist, pink.  Neck: Symmetric with no goiter enlargement.  Chest/Lungs:  Symmetric chest with inspiration/expiration.  No labored breathing.  Clear to auscultation with no wheezes, rhonchi, or rales.  Heart:  RRR, with no third heart sounds appreciated. No JVD appreciated.  Abdomen:  Soft, NT/ND, with + bowel sounds.   Genito-urinary: Deferred Neurologic: Alert & Oriented to person, place, and time.   Normal affect and insight.  Appropriate questions.  Moving all 4 extremities with gross sensory intact.    Imaging: MR Lumbar Spine W Wo Contrast  Result Date: 05/11/2020 CLINICAL DATA:  bone lesion on MRI abd EXAM: MRI LUMBAR SPINE WITHOUT AND WITH CONTRAST TECHNIQUE: Multiplanar and multiecho pulse sequences of the lumbar spine were obtained without and with intravenous contrast. CONTRAST:  37m GADAVIST GADOBUTROL 1 MMOL/ML IV SOLN COMPARISON:  04/07/2020. FINDINGS: Segmentation:  Standard. Alignment:  Minimal grade 1 L4-5 anterolisthesis. Vertebrae: Vertebral body heights are preserved. Mild Modic type 2 endplate degenerative changes. T1/T2 heterogenous signal involving the entirety of the L1 vertebral body with associated STIR hyperintensity and heterogenous enhancement. No  other lesion identified. Conus medullaris and cauda equina: Conus extends to the L2 level. Conus and cauda equina appear normal. Disc levels: Multilevel desiccation. T12-L1: Small central protrusion. Patent spinal canal and neural foramen. L1-2: Minimal disc bulge and bilateral facet degenerative spurring. Patent spinal canal and neural foramen. L2-3: Minimal disc bulge with superimposed bilateral extraforaminal protrusions. Facet degenerative spurring. Patent spinal canal and neural foramen. L3-4: Disc bulge with superimposed left foraminal protrusion. Facet degenerative spurring. Patent spinal canal. Mild bilateral neural foraminal narrowing. L4-5: Uncovered bulge with small superimposed left subarticular protrusion. Bilateral facet hypertrophy and ligamentum flavum thickening. Mild spinal canal and bilateral neural foraminal narrowing. L5-S1: Disc bulge with shallow central protrusion. Bilateral facet degenerative spurring. Patent spinal canal. Mild bilateral neural foraminal narrowing. Paraspinal and other soft tissues: Negative. 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 If suspicion persists short-term interval follow-up in 8-12 weeks with postcontrast MRI may be considered.  Otherwise no aggressive osseous lesion. Multilevel spondylosis as detailed above. Electronically Signed   By: Primitivo Gauze M.D.   On: 05/11/2020 14:35   US BIOPSY (LIVER)  Result Date: 05/11/2020 INDICATION: Elevated alkaline phosphatase and liver transaminase levels. EXAM: ULTRASOUND GUIDED CORE BIOPSY OF LIVER MEDICATIONS: None. ANESTHESIA/SEDATION: Fentanyl 100 mcg IV; Versed 2.0 mg IV Moderate Sedation Time:  15 minutes. The patient was continuously monitored during the procedure by the interventional radiology nurse under my direct supervision. PROCEDURE: The procedure, risks, benefits, and alternatives were explained to the patient. Questions regarding the procedure were encouraged and  answered. The patient understands and consents to the procedure. A time-out was performed prior to initiating the procedure. The liver was localized by ultrasound. The abdominal wall was prepped with chlorhexidine in a sterile fashion, and a sterile drape was applied covering the operative field. A sterile gown and sterile gloves were used for the procedure. Local anesthesia was provided with 1% Lidocaine. A 17 gauge trocar needle was advanced into the right lobe of the liver. Three separate coaxial 18 gauge core biopsy samples were obtained and submitted in formalin. Gel-Foam pledgets were advanced through the outer needle as the needle was retracted and removed. Additional ultrasound was performed. COMPLICATIONS: None immediate. FINDINGS: Solid core biopsy samples were obtained from the liver parenchyma. There were no immediate bleeding complications. IMPRESSION: Ultrasound-guided core biopsy performed of the liver within the right lobe parenchyma. Electronically Signed   By: Aletta Edouard M.D.   On: 05/11/2020 11:38   ECHOCARDIOGRAM LIMITED  Result Date: 06/01/2020    ECHOCARDIOGRAM LIMITED REPORT   Patient Name:   ANAROSA KUBISIAK Date of Exam: 06/01/2020 Medical Rec #:  [id number]         Height:       63.5 in Accession #:    [id number]        Weight:       146.5 lb Date of Birth:  26-Mar-1946          BSA:          1.704 m Patient Age:    13 years          BP:           143/83 mmHg Patient Gender: F                 HR:           84 bpm. Exam Location:  [facility] Procedure: 2D Echo, Cardiac Doppler, Color Doppler and Strain Analysis Indications:     Amyloidosis- unspecified type  History:         Patient has no prior history of Echocardiogram examinations.  Sonographer:     Sherrie Sport RDCS (AE) Referring Phys:  [id number] ZHOU YU Diagnosing Phys: Kate Sable MD  Sonographer Comments: Global longitudinal strain was attempted. IMPRESSIONS  1. Left ventricular ejection fraction, by estimation, is 60 to 65%. The  left ventricle has normal function. There is mild asymmetric left ventricular hypertrophy of the basal-septal segment. Left ventricular diastolic parameters are indeterminate. The average left ventricular global longitudinal strain is -16.8 %.  2. Right ventricular systolic function is normal.  3. The mitral valve is normal in structure. Mild mitral valve regurgitation.  4. The aortic valve is tricuspid. Aortic valve regurgitation is not visualized. Mild aortic valve sclerosis is present, with no evidence of aortic valve stenosis.  5. The inferior vena cava is normal in size with greater than 50% respiratory variability, suggesting right atrial pressure  of 3 mmHg. Conclusion(s)/Recommendation(s): No echocardiographic findings to suggest cardiac amyloid on this study. FINDINGS  Left Ventricle: Left ventricular ejection fraction, by estimation, is 60 to 65%. The left ventricle has normal function. The average left ventricular global longitudinal strain is -16.8 %. The left ventricular internal cavity size was normal in size. There is mild asymmetric left ventricular hypertrophy of the basal-septal segment. Left ventricular diastolic parameters are indeterminate. Right Ventricle: No increase in right ventricular wall thickness. Right ventricular systolic function is normal. Left Atrium: Left atrial size was normal in size. Right Atrium: Right atrial size was normal in size. Pericardium: There is no evidence of pericardial effusion. Mitral Valve: The mitral valve is normal in structure. Mild mitral valve regurgitation. Tricuspid Valve: The tricuspid valve is normal in structure. Tricuspid valve regurgitation is not demonstrated. Aortic Valve: The aortic valve is tricuspid. Aortic valve regurgitation is not visualized. Mild aortic valve sclerosis is present, with no evidence of aortic valve stenosis. Aortic valve mean gradient measures 5.0 mmHg. Aortic valve peak gradient measures 8.2 mmHg. Aortic valve area, by VTI  measures 1.58 cm. Pulmonic Valve: The pulmonic valve was normal in structure. Pulmonic valve regurgitation is trivial. Aorta: The aortic root is normal in size and structure. Venous: The inferior vena cava is normal in size with greater than 50% respiratory variability, suggesting right atrial pressure of 3 mmHg. LEFT VENTRICLE PLAX 2D LVIDd:         4.28 cm  Diastology LVIDs:         2.44 cm  LV e' medial:    5.66 cm/s LV PW:         1.72 cm  LV E/e' medial:  13.6 LV IVS:        1.01 cm  LV e' lateral:   5.11 cm/s LVOT diam:     2.00 cm  LV E/e' lateral: 15.1 LV SV:         49 LV SV Index:   29       2D Longitudinal Strain LVOT Area:     3.14 cm 2D Strain GLS Avg:     -16.8 %                          3D Volume EF:                         3D EF:        62 %                         LV EDV:       115 ml                         LV ESV:       44 ml                         LV SV:        71 ml RIGHT VENTRICLE RV Basal diam:  3.11 cm RV S prime:     10.10 cm/s TAPSE (M-mode): 3.1 cm LEFT ATRIUM             Index       RIGHT ATRIUM           Index LA diam:        3.80 cm 2.23 cm/m  RA Area:     13.60 cm LA Vol (A2C):   47.7 ml 27.99 ml/m RA Volume:   32.60 ml  19.13 ml/m LA Vol (A4C):   46.4 ml 27.23 ml/m LA Biplane Vol: 49.3 ml 28.93 ml/m  AORTIC VALVE                    PULMONIC VALVE AV Area (Vmax):    1.62 cm     PV Vmax:        0.65 m/s AV Area (Vmean):   1.56 cm     PV Peak grad:   1.7 mmHg AV Area (VTI):     1.58 cm     RVOT Peak grad: 2 mmHg AV Vmax:           143.00 cm/s AV Vmean:          104.667 cm/s AV VTI:            0.309 m AV Peak Grad:      8.2 mmHg AV Mean Grad:      5.0 mmHg LVOT Vmax:         73.80 cm/s LVOT Vmean:        52.000 cm/s LVOT VTI:          0.156 m LVOT/AV VTI ratio: 0.50  AORTA Ao Root diam: 3.10 cm MITRAL VALVE               TRICUSPID VALVE MV Area (PHT): 4.54 cm    TR Peak grad:   11.2 mmHg MV Decel Time: 167 msec    TR Vmax:        167.00 cm/s MV E velocity: 77.10 cm/s MV A  velocity: 72.00 cm/s  SHUNTS MV E/A ratio:  1.07        Systemic VTI:  0.16 m                            Systemic Diam: 2.00 cm Kate Sable MD Electronically signed by Kate Sable MD Signature Date/Time: 06/01/2020/4:06:38 PM    Final     Labs:  CBC: Recent Labs    09/02/19 1608 03/16/20 1124 05/11/20 0949 06/09/20 0804  WBC 7.1 6.5 6.7 6.9  HGB 12.2 12.0 12.4 12.2  HCT 37.4 36.9 36.5 36.4  PLT 459* 395 430* 453*    COAGS: Recent Labs    03/23/20 1123 05/11/20 0949 05/20/20 1046  INR 1.1 1.1 1.1  APTT 34  --  30    BMP: Recent Labs    09/02/19 1608 03/16/20 1124 05/24/20 1048  NA 138 135  --   K 3.6 3.8  --   CL 100 100  --   CO2 29 24  --   GLUCOSE 216* 278*  --   BUN 24* 25*  --   CALCIUM 9.4 9.7  --   CREATININE 1.15* 1.37* 1.00  GFRNONAA 47* 40* 59*  GFRAA 54*  --   --     LIVER FUNCTION TESTS: Recent Labs    09/02/19 1608 03/16/20 1124  BILITOT 1.1 1.0  AST 31 66*  ALT 27 55*  ALKPHOS 170* 520*  PROT 8.4* 7.7  ALBUMIN 3.9 3.1*    TUMOR MARKERS: No results for input(s): AFPTM, CEA, CA199, CHROMGRNA in the last 8760 hours.  Assessment and Plan:  Ms Hazzard presents for bone marrow biopsy.   Risks and benefits of bone marrow biospy was discussed with the  patient and/or patient's family including, but not limited to bleeding, infection, damage to adjacent structures or low yield requiring additional tests.  All of the questions were answered and there is agreement to proceed.  Consent signed and in chart.  Thank you for this interesting consult.  I greatly enjoyed meeting GILBERTO STRECK and look forward to participating in their care.  A copy of this report was sent to the requesting provider on this date.  Electronically Signed: Corrie Mckusick, DO 06/09/2020, 9:25 AM   I spent a total of  30 Minutes   in face to face in clinical consultation, greater than 50% of which was counseling/coordinating care for image guided bone  marrow biopsy

## 2020-06-09 NOTE — Discharge Instructions (Signed)
Moderate Conscious Sedation, Adult, Care After This sheet gives you information about how to care for yourself after your procedure. Your health care provider may also give you more specific instructions. If you have problems or questions, contact your health care provider. What can I expect after the procedure? After the procedure, it is common to have:  Sleepiness for several hours.  Impaired judgment for several hours.  Difficulty with balance.  Vomiting if you eat too soon. Follow these instructions at home: For the time period you were told by your health care provider:  Rest.  Do not participate in activities where you could fall or become injured.  Do not drive or use machinery.  Do not drink alcohol.  Do not take sleeping pills or medicines that cause drowsiness.  Do not make important decisions or sign legal documents.  Do not take care of children on your own.      Eating and drinking  Follow the diet recommended by your health care provider.  Drink enough fluid to keep your urine pale yellow.  If you vomit: ? Drink water, juice, or soup when you can drink without vomiting. ? Make sure you have little or no nausea before eating solid foods.   General instructions  Take over-the-counter and prescription medicines only as told by your health care provider.  Have a responsible adult stay with you for the time you are told. It is important to have someone help care for you until you are awake and alert.  Do not smoke.  Keep all follow-up visits as told by your health care provider. This is important. Contact a health care provider if:  You are still sleepy or having trouble with balance after 24 hours.  You feel light-headed.  You keep feeling nauseous or you keep vomiting.  You develop a rash.  You have a fever.  You have redness or swelling around the IV site. Get help right away if:  You have trouble breathing.  You have new-onset confusion at  home. Summary  After the procedure, it is common to feel sleepy, have impaired judgment, or feel nauseous if you eat too soon.  Rest after you get home. Know the things you should not do after the procedure.  Follow the diet recommended by your health care provider and drink enough fluid to keep your urine pale yellow.  Get help right away if you have trouble breathing or new-onset confusion at home. This information is not intended to replace advice given to you by your health care provider. Make sure you discuss any questions you have with your health care provider. Document Revised: 07/31/2019 Document Reviewed: 02/26/2019 Elsevier Patient Education  2021 [employer] Bone Marrow Aspiration and Bone Marrow Biopsy, Adult, Care After This sheet gives you information about how to care for yourself after your procedure. Your health care provider may also give you more specific instructions. If you have problems or questions, contact your health care provider. What can I expect after the procedure? After the procedure, it is common to have:  Mild pain and tenderness.  Swelling.  Bruising. Follow these instructions at home: Puncture site care  Follow instructions from your health care provider about how to take care of the puncture site. Make sure you: ? Wash your hands with soap and water before and after you change your bandage (dressing). If soap and water are not available, use hand sanitizer. ? Change your dressing as told by your health care provider.  Check   your puncture site every day for signs of infection. Check for: ? More redness, swelling, or pain. ? Fluid or blood. ? Warmth. ? Pus or a bad smell.   Activity  Return to your normal activities as told by your health care provider. Ask your health care provider what activities are safe for you.  Do not lift anything that is heavier than 10 lb (4.5 kg), or the limit that you are told, until your health care provider  says that it is safe.  Do not drive for 24 hours if you were given a sedative during your procedure. General instructions  Take over-the-counter and prescription medicines only as told by your health care provider.  Do not take baths, swim, or use a hot tub until your health care provider approves. Ask your health care provider if you may take showers. You may only be allowed to take sponge baths.  If directed, put ice on the affected area. To do this: ? Put ice in a plastic bag. ? Place a towel between your skin and the bag. ? Leave the ice on for 20 minutes, 2-3 times a day.  Keep all follow-up visits as told by your health care provider. This is important.   Contact a health care provider if:  Your pain is not controlled with medicine.  You have a fever.  You have more redness, swelling, or pain around the puncture site.  You have fluid or blood coming from the puncture site.  Your puncture site feels warm to the touch.  You have pus or a bad smell coming from the puncture site. Summary  After the procedure, it is common to have mild pain, tenderness, swelling, and bruising.  Follow instructions from your health care provider about how to take care of the puncture site and what activities are safe for you.  Take over-the-counter and prescription medicines only as told by your health care provider.  Contact a health care provider if you have any signs of infection, such as fluid or blood coming from the puncture site. This information is not intended to replace advice given to you by your health care provider. Make sure you discuss any questions you have with your health care provider. Document Revised: 08/19/2018 Document Reviewed: 08/19/2018 Elsevier Patient Education  2021 Elsevier Inc.  

## 2020-06-09 NOTE — Procedures (Signed)
Interventional Radiology Procedure Note  Procedure: CT guided aspirate and core biopsy of right posterior iliac bone Complications: None Recommendations: - Bedrest supine x 1 hrs - OTC's PRN  Pain - Follow biopsy results  Signed,  Payal Stanforth S. Aadith Raudenbush, DO    

## 2020-06-16 ENCOUNTER — Other Ambulatory Visit: Payer: Self-pay | Admitting: Oncology

## 2020-06-17 ENCOUNTER — Inpatient Hospital Stay: Payer: Medicare Other

## 2020-06-17 ENCOUNTER — Encounter: Payer: Self-pay | Admitting: Oncology

## 2020-06-17 ENCOUNTER — Other Ambulatory Visit: Payer: Self-pay

## 2020-06-17 ENCOUNTER — Inpatient Hospital Stay: Payer: Medicare Other | Attending: Oncology | Admitting: Oncology

## 2020-06-17 ENCOUNTER — Encounter (HOSPITAL_COMMUNITY): Payer: Self-pay | Admitting: Oncology

## 2020-06-17 VITALS — BP 146/80 | HR 79 | Temp 98.6°F | Resp 18 | Wt 150.6 lb

## 2020-06-17 DIAGNOSIS — E8581 Light chain (AL) amyloidosis: Secondary | ICD-10-CM | POA: Insufficient documentation

## 2020-06-17 DIAGNOSIS — Z7189 Other specified counseling: Secondary | ICD-10-CM

## 2020-06-17 DIAGNOSIS — D472 Monoclonal gammopathy: Secondary | ICD-10-CM | POA: Diagnosis not present

## 2020-06-17 DIAGNOSIS — R748 Abnormal levels of other serum enzymes: Secondary | ICD-10-CM | POA: Insufficient documentation

## 2020-06-17 DIAGNOSIS — M47815 Spondylosis without myelopathy or radiculopathy, thoracolumbar region: Secondary | ICD-10-CM | POA: Insufficient documentation

## 2020-06-17 DIAGNOSIS — Z5112 Encounter for antineoplastic immunotherapy: Secondary | ICD-10-CM | POA: Insufficient documentation

## 2020-06-17 DIAGNOSIS — R7401 Elevation of levels of liver transaminase levels: Secondary | ICD-10-CM | POA: Insufficient documentation

## 2020-06-17 DIAGNOSIS — E859 Amyloidosis, unspecified: Secondary | ICD-10-CM

## 2020-06-17 DIAGNOSIS — M1990 Unspecified osteoarthritis, unspecified site: Secondary | ICD-10-CM | POA: Diagnosis not present

## 2020-06-17 DIAGNOSIS — Z79899 Other long term (current) drug therapy: Secondary | ICD-10-CM | POA: Diagnosis not present

## 2020-06-17 DIAGNOSIS — D631 Anemia in chronic kidney disease: Secondary | ICD-10-CM | POA: Diagnosis not present

## 2020-06-17 DIAGNOSIS — N1832 Chronic kidney disease, stage 3b: Secondary | ICD-10-CM | POA: Insufficient documentation

## 2020-06-17 DIAGNOSIS — R739 Hyperglycemia, unspecified: Secondary | ICD-10-CM

## 2020-06-17 DIAGNOSIS — Z5111 Encounter for antineoplastic chemotherapy: Secondary | ICD-10-CM | POA: Insufficient documentation

## 2020-06-17 LAB — COMPREHENSIVE METABOLIC PANEL
ALT: 29 U/L (ref 0–44)
AST: 52 U/L — ABNORMAL HIGH (ref 15–41)
Albumin: 2.1 g/dL — ABNORMAL LOW (ref 3.5–5.0)
Alkaline Phosphatase: 846 U/L — ABNORMAL HIGH (ref 38–126)
Anion gap: 9 (ref 5–15)
BUN: 17 mg/dL (ref 8–23)
CO2: 24 mmol/L (ref 22–32)
Calcium: 8.7 mg/dL — ABNORMAL LOW (ref 8.9–10.3)
Chloride: 100 mmol/L (ref 98–111)
Creatinine, Ser: 0.97 mg/dL (ref 0.44–1.00)
GFR, Estimated: >60 mL/min (ref >60)
Glucose, Bld: 452 mg/dL — ABNORMAL HIGH (ref 70–99)
Potassium: 3.5 mmol/L (ref 3.5–5.1)
Sodium: 133 mmol/L — ABNORMAL LOW (ref 135–145)
Total Bilirubin: 1 mg/dL (ref 0.3–1.2)
Total Protein: 6.3 g/dL — ABNORMAL LOW (ref 6.5–8.1)

## 2020-06-17 LAB — TYPE AND SCREEN
ABO/RH(D): A POS
Antibody Screen: NEGATIVE

## 2020-06-17 LAB — CBC WITH DIFFERENTIAL/PLATELET
Abs Immature Granulocytes: 0.06 10*3/uL (ref 0.00–0.07)
Basophils Absolute: 0.1 10*3/uL (ref 0.0–0.1)
Basophils Relative: 1 %
Eosinophils Absolute: 0.2 10*3/uL (ref 0.0–0.5)
Eosinophils Relative: 3 %
HCT: 34.9 % — ABNORMAL LOW (ref 36.0–46.0)
Hemoglobin: 11.6 g/dL — ABNORMAL LOW (ref 12.0–15.0)
Immature Granulocytes: 1 %
Lymphocytes Relative: 24 %
Lymphs Abs: 1.5 10*3/uL (ref 0.7–4.0)
MCH: 24.8 pg — ABNORMAL LOW (ref 26.0–34.0)
MCHC: 33.2 g/dL (ref 30.0–36.0)
MCV: 74.7 fL — ABNORMAL LOW (ref 80.0–100.0)
Monocytes Absolute: 0.5 10*3/uL (ref 0.1–1.0)
Monocytes Relative: 8 %
Neutro Abs: 3.9 10*3/uL (ref 1.7–7.7)
Neutrophils Relative %: 63 %
Platelets: 430 10*3/uL — ABNORMAL HIGH (ref 150–400)
RBC: 4.67 MIL/uL (ref 3.87–5.11)
RDW: 21.6 % — ABNORMAL HIGH (ref 11.5–15.5)
WBC: 6.2 10*3/uL (ref 4.0–10.5)
nRBC: 1.1 % — ABNORMAL HIGH (ref 0.0–0.2)

## 2020-06-17 LAB — PRETREATMENT RBC PHENOTYPE: DAT, IgG: NEGATIVE

## 2020-06-17 NOTE — Progress Notes (Signed)
Hematology/Oncology Consult note [facility] Telephone:([phone] [phone]   Patient Care Team: Leonel Ramsay, MD as PCP - General (Infectious Diseases)  REFERRING PROVIDER: Leonel Ramsay, MD  CHIEF COMPLAINTS/REASON FOR VISIT:  Follow-up for amyloidosis  HISTORY OF PRESENTING ILLNESS:   Nancy Rodriguez is a  75 y.o.  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  Work up include protein electrophoresis showed M protein 0.7,   Reviewed her previous medical records via care everywhere.  She was seen by Hematology Oncology at [facility] on 02/14/2017.  07/25/2007  SPEP showed M protein of 0.35, IFE showed IgG lamda 09/22/2007  Bone survey negative.  02/15/14 IgG 930, SPEP M protien 0.22, free kappa light chain 3.59, lamda 2.92, ratio 1.23  Hypercalcemia, resolved after stopping HCTZ.  # Transaminitis 03/28/20 US abdomen showed left liver lobe Complex 1.7 cm cyst  # 04/07/20 MRI abdomen w/wo contrast showed 2 benign liver cysts, 2 nonspecific hypovascular irighr liver lesions, abnormal bone marrow signal of L1 vertebral body.  Patient was  advised to proceed with bone marrow biopsy and she declined.  # referred her to established care with GI and was seen on 04/19/20,  # Liver biopsy showed amyloidosis. [place] MS/MS showed AL type  INTERVAL HISTORY Nancy Rodriguez is a 75 y.o. female who has above history reviewed by me today presents for follow up visit for management of amyloidosis Problems and complaints are listed below: Patient underwent bone marrow biopsy during interval.  She reports no new complaints except some soreness around the site of biopsy. Denies any nausea vomiting diarrhea. 06/09/2020, bone marrow biopsy showed monoclonal plasmacytosis, 8%, amyloid  deposit present.  Absent iron stores. 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 t (14;20)  Review of Systems  Constitutional: Negative for appetite change, chills, fatigue and fever.  HENT:   Negative for hearing loss, nosebleeds and voice change.   Eyes: Negative for eye problems.  Respiratory: Negative for chest tightness and cough.   Cardiovascular: Negative for chest pain.  Gastrointestinal: Negative for abdominal distention, abdominal pain and blood in stool.  Endocrine: Negative for hot flashes.  Genitourinary: Negative for difficulty urinating and frequency.   Musculoskeletal: Negative for arthralgias.  Skin: Negative for itching and rash.  Neurological: Negative for extremity weakness.  Hematological: Negative for adenopathy.  Psychiatric/Behavioral: Negative for confusion.    MEDICAL HISTORY:  Past Medical History:  Diagnosis Date   Anemia    Anemia in chronic kidney disease   Chronic kidney disease    Stage 3b chronic kidney disease   Diabetes mellitus without complication (HCC)    Hypercholesterolemia    Hypertension    MGUS (monoclonal gammopathy of unknown significance)    Osteoarthritis    Rheumatoid arthritis ([place])     SURGICAL HISTORY: Past Surgical History:  Procedure Laterality Date   HERNIA REPAIR     PARATHYROIDECTOMY      SOCIAL HISTORY: Social History   Socioeconomic History   Marital status: Widowed    Spouse name: Not on file   Number of children: 6   Years of education: Not on file   Highest education level: Not on file  Occupational History   Not on file  Tobacco Use   Smoking status: Never Smoker  Smokeless tobacco: Never Used  Substance and Sexual Activity   Alcohol use: No   Drug use: Never   Sexual activity: Not on file  Other Topics Concern   Not on file  Social History Narrative   Not on file   Social Determinants of Health   Financial Resource  Strain: Not on file  Food Insecurity: Not on file  Transportation Needs: Not on file  Physical Activity: Not on file  Stress: Not on file  Social Connections: Not on file  Intimate Partner Violence: Not on file    FAMILY HISTORY: Family History  Problem Relation Age of Onset   Diabetes Daughter    Diabetes Son     ALLERGIES:  is allergic to benazepril, lisinopril, tolmetin, and nsaids.  MEDICATIONS:  Current Outpatient Medications  Medication Sig Dispense Refill   albuterol (VENTOLIN HFA) 108 (90 Base) MCG/ACT inhaler Inhale 2 puffs into the lungs every 4 (four) hours as needed.     amLODipine (NORVASC) 5 MG tablet Take by mouth.     docusate sodium (COLACE) 100 MG capsule Take 1 capsule by mouth as needed.     insulin glargine (LANTUS SOLOSTAR) 100 UNIT/ML Solostar Pen Inject 10 Units into the skin at bedtime.     metoprolol succinate (TOPROL-XL) 50 MG 24 hr tablet TAKE 1 TABLET (50 MG TOTAL) BY MOUTH DAILY IN THE MORNING     prednisoLONE acetate (PRED FORTE) 1 % ophthalmic suspension Place 1 drop into both eyes daily.      aspirin EC 81 MG tablet Take 81 mg by mouth daily. (Patient not taking: Reported on 06/17/2020)     canagliflozin (INVOKANA) 100 MG TABS tablet Take by mouth. (Patient not taking: No sig reported)     diazepam (VALIUM) 5 MG tablet Take 1 tablet (5 mg total) by mouth every 8 (eight) hours as needed for muscle spasms. (Patient not taking: No sig reported) 20 tablet 0   EPINEPHrine 0.3 mg/0.3 mL IJ SOAJ injection as needed (Patient not taking: No sig reported)     ferrous sulfate 325 (65 FE) MG EC tablet TAKE 1 TABLET BY MOUTH EVERY DAY (Patient not taking: Reported on 06/17/2020) 90 tablet 1   glipiZIDE (GLUCOTROL XL) 10 MG 24 hr tablet Take by mouth. 1 QAM 1 QPM (Patient not taking: Reported on 06/17/2020)     potassium chloride SA (KLOR-CON) 20 MEQ tablet Take 20 mEq by mouth daily. (Patient not taking: Reported on 06/17/2020)     rosuvastatin (CRESTOR)  20 MG tablet Take 20 mg by mouth daily.  (Patient not taking: No sig reported)     sulfamethoxazole-trimethoprim (BACTRIM DS) 800-160 MG tablet Take 1 tablet by mouth 2 (two) times daily. (Patient not taking: Reported on 06/17/2020)     traMADol (ULTRAM) 50 MG tablet Take 1 tablet (50 mg total) by mouth every 6 (six) hours as needed for severe pain. (Patient not taking: No sig reported) 8 tablet 0   No current facility-administered medications for this visit.     PHYSICAL EXAMINATION: ECOG PERFORMANCE STATUS: 1 - Symptomatic but completely ambulatory Vitals:   06/17/20 1014  BP: (!) 146/80  Pulse: 79  Resp: 18  Temp: 98.6 F (37 C)   Filed Weights   06/17/20 1014  Weight: 150 lb 9.6 oz (68.3 kg)    Physical Exam Constitutional:      General: She is not in acute distress. HENT:     Head: Normocephalic and atraumatic.  Eyes:  General: No scleral icterus. Cardiovascular:     Rate and Rhythm: Normal rate and regular rhythm.     Heart sounds: Normal heart sounds.  Pulmonary:     Effort: Pulmonary effort is normal. No respiratory distress.     Breath sounds: No wheezing.  Abdominal:     General: Bowel sounds are normal. There is no distension.     Palpations: Abdomen is soft.  Musculoskeletal:        General: No deformity. Normal range of motion.     Cervical back: Normal range of motion and neck supple.  Skin:    General: Skin is warm and dry.     Findings: No erythema or rash.  Neurological:     Mental Status: She is alert and oriented to person, place, and time. Mental status is at baseline.     Cranial Nerves: No cranial nerve deficit.     Coordination: Coordination normal.  Psychiatric:        Mood and Affect: Mood normal.     LABORATORY DATA:  I have reviewed the data as listed Lab Results  Component Value Date   WBC 6.2 06/17/2020   HGB 11.6 (L) 06/17/2020   HCT 34.9 (L) 06/17/2020   MCV 74.7 (L) 06/17/2020   PLT 430 (H) 06/17/2020   Recent Labs     09/02/19 1608 03/16/20 1124 05/24/20 1048 06/17/20 1139  NA 138 135  --  133*  K 3.6 3.8  --  3.5  CL 100 100  --  100  CO2 29 24  --  24  GLUCOSE 216* 278*  --  452*  BUN 24* 25*  --  17  CREATININE 1.15* 1.37* 1.00 0.97  CALCIUM 9.4 9.7  --  8.7*  GFRNONAA 47* 40* 59* >60  GFRAA 54*  --   --   --   PROT 8.4* 7.7  --  6.3*  ALBUMIN 3.9 3.1*  --  2.1*  AST 31 66*  --  52*  ALT 27 55*  --  29  ALKPHOS 170* 520*  --  846*  BILITOT 1.1 1.0  --  1.0   Iron/TIBC/Ferritin/ %Sat    Component Value Date/Time   IRON 45 09/02/2019 1608   TIBC 416 09/02/2019 1608   FERRITIN 35 09/02/2019 1608   IRONPCTSAT 11 09/02/2019 1608     08/25/2019, platelet count 491, WBC 7.5, hemoglobin 12 Creatinine 1.58, EGFR 37, calcium 10.8, albumin 4.2 Negative hepatitis B surface antigen, hepatitis B core antibody, Negative hepatitis C 08/05/2019, A1c 11.2   RADIOGRAPHIC STUDIES: I have personally reviewed the radiological images as listed and agreed with the findings in the report. MR Lumbar Spine W Wo Contrast  Result Date: 05/11/2020 CLINICAL DATA:  bone lesion on MRI abd EXAM: MRI LUMBAR SPINE WITHOUT AND WITH CONTRAST TECHNIQUE: Multiplanar and multiecho pulse sequences of the lumbar spine were obtained without and with intravenous contrast. CONTRAST:  30m GADAVIST GADOBUTROL 1 MMOL/ML IV SOLN COMPARISON:  04/07/2020. FINDINGS: Segmentation:  Standard. Alignment:  Minimal grade 1 L4-5 anterolisthesis. Vertebrae: Vertebral body heights are preserved. Mild Modic type 2 endplate degenerative changes. T1/T2 heterogenous signal involving the entirety of the L1 vertebral body with associated STIR hyperintensity and heterogenous enhancement. No other lesion identified. Conus medullaris and cauda equina: Conus extends to the L2 level. Conus and cauda equina appear normal. Disc levels: Multilevel desiccation. T12-L1: Small central protrusion. Patent spinal canal and neural foramen. L1-2: Minimal disc bulge and  bilateral facet degenerative spurring. Patent  spinal canal and neural foramen. L2-3: Minimal disc bulge with superimposed bilateral extraforaminal protrusions. Facet degenerative spurring. Patent spinal canal and neural foramen. L3-4: Disc bulge with superimposed left foraminal protrusion. Facet degenerative spurring. Patent spinal canal. Mild bilateral neural foraminal narrowing. L4-5: Uncovered bulge with small superimposed left subarticular protrusion. Bilateral facet hypertrophy and ligamentum flavum thickening. Mild spinal canal and bilateral neural foraminal narrowing. L5-S1: Disc bulge with shallow central protrusion. Bilateral facet degenerative spurring. Patent spinal canal. Mild bilateral neural foraminal narrowing. Paraspinal and other soft tissues: Negative. 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 If suspicion persists short-term interval follow-up in 8-12 weeks with postcontrast MRI may be considered. Otherwise no aggressive osseous lesion. Multilevel spondylosis as detailed above. Electronically Signed   By: Primitivo Gauze M.D.   On: 05/11/2020 14:35   MR Abdomen W Wo Contrast  Result Date: 04/07/2020 CLINICAL DATA:  Elevated liver enzymes. Follow-up indeterminate cystic liver lesion seen on recent ultrasound. Monoclonal gammopathy of of unknown significance. EXAM: MRI ABDOMEN WITHOUT AND WITH CONTRAST TECHNIQUE: Multiplanar multisequence MR imaging of the abdomen was performed both before and after the administration of intravenous contrast. CONTRAST:  83mL GADAVIST GADOBUTROL 1 MMOL/ML IV SOLN COMPARISON:  Ultrasound on 03/28/2020 FINDINGS: Lower chest: No acute findings. Hepatobiliary: 2 minimally complicated cysts are seen lateral segment of the left lobe, measuring 1.5 cm and 1.2 cm in maximum diameter. Both of these lesions contain a few thin internal septations, but no evidence of thickened septations enhancing soft tissue component. A poorly defined  hypovascular lesion is seen in the posterior right hepatic lobe which measures 1.4 cm on image 51/17. A probable similar but smaller lesion is seen in the anterior right lobe measuring 9 mm on image 38/17. These lesions are not seen on T2 or diffusion weighted sequences, which although nonspecific, are suggestive of benign etiology. Several tiny less than 5 mm gallstones are seen, however there is no evidence of cholecystitis or biliary ductal dilatation. Pancreas:  No mass or inflammatory changes. Spleen:  Within normal limits in size and appearance. Adrenals/Urinary Tract: No masses identified. A few small renal cysts noted bilaterally. No evidence of hydronephrosis. Stomach/Bowel: Visualized portion unremarkable. Vascular/Lymphatic: No pathologically enlarged lymph nodes identified. No abdominal aortic aneurysm. Other:  None. Musculoskeletal: Diffuse T2 hyperintensity and abnormal contrast enhancement are seen throughout the L1 vertebral body, raising suspicion for multiple myeloma in this patient with known MGUS. IMPRESSION: Two benign cystic lesions in left hepatic lobe, which correspond with the lesions seen on recent ultrasound. 2 nonspecific hypovascular lesions in the right hepatic lobe, largest measuring 1.4 cm. Recommend continued follow-up by MRI in 6 months. Abnormal bone marrow signal throughout the L1 vertebral body, raising suspicion for multiple myeloma in this patient with known MGUS. Consider dedicated lumbar spine MRI without and with contrast or PET-CT for further evaluation. Cholelithiasis. No radiographic evidence of cholecystitis or biliary ductal dilatation. Electronically Signed   By: Marlaine Hind M.D.   On: 04/07/2020 11:03   US BIOPSY (LIVER)  Result Date: 05/11/2020 INDICATION: Elevated alkaline phosphatase and liver transaminase levels. EXAM: ULTRASOUND GUIDED CORE BIOPSY OF LIVER MEDICATIONS: None. ANESTHESIA/SEDATION: Fentanyl 100 mcg IV; Versed 2.0 mg IV Moderate Sedation Time:   15 minutes. The patient was continuously monitored during the procedure by the interventional radiology nurse under my direct supervision. PROCEDURE: The procedure, risks, benefits, and alternatives were explained to the patient. Questions regarding the procedure were encouraged and answered. The patient understands and consents to the procedure. A  time-out was performed prior to initiating the procedure. The liver was localized by ultrasound. The abdominal wall was prepped with chlorhexidine in a sterile fashion, and a sterile drape was applied covering the operative field. A sterile gown and sterile gloves were used for the procedure. Local anesthesia was provided with 1% Lidocaine. A 17 gauge trocar needle was advanced into the right lobe of the liver. Three separate coaxial 18 gauge core biopsy samples were obtained and submitted in formalin. Gel-Foam pledgets were advanced through the outer needle as the needle was retracted and removed. Additional ultrasound was performed. COMPLICATIONS: None immediate. FINDINGS: Solid core biopsy samples were obtained from the liver parenchyma. There were no immediate bleeding complications. IMPRESSION: Ultrasound-guided core biopsy performed of the liver within the right lobe parenchyma. Electronically Signed   By: Aletta Edouard M.D.   On: 05/11/2020 11:38   DG Bone Survey Met  Result Date: 03/24/2020 CLINICAL DATA:  Monoclonal gammopathy of unknown significance. Abnormal SPEP. EXAM: METASTATIC BONE SURVEY COMPARISON:  None. FINDINGS: Slightly heterogeneous appearance of the calvarium involving the frontal and parietal bones without discrete focal lesion. There is no evidence of discrete lytic or blastic lesion in the axial or appendicular skeleton. Mild multifocal osteoarthritis. Diffuse spondylosis of the thoracic and lumbar spine. No spinal compression fracture. Mild elevation of the right hemidiaphragm with adjacent atelectasis/scarring. IMPRESSION: 1. Slightly  heterogeneous appearance of the calvarium in the frontal and parietal bones is nonspecific. This can be seen in the setting of osteopenia. Consider further evaluation with head CT based on clinical concern. No discrete calvarial lesion is seen. 2. No focal bone lesion in the visualized skeleton. 3. Mild multifocal osteoarthritis. Diffuse thoracolumbar spondylosis. Electronically Signed   By: Keith Rake M.D.   On: 03/24/2020 14:50   CT BONE MARROW BIOPSY & ASPIRATION  Result Date: 06/09/2020 INDICATION: 75 year old female with a history of monoclonal gammopathy referred for bone marrow biopsy EXAM: CT BONE MARROW BIOPSY AND ASPIRATION MEDICATIONS: None. ANESTHESIA/SEDATION: Moderate (conscious) sedation was employed during this procedure. A total of Versed 1.0 mg and Fentanyl 50 mcg was administered intravenously. Moderate Sedation Time: 10 minutes. The patient's level of consciousness and vital signs were monitored continuously by radiology nursing throughout the procedure under my direct supervision. FLUOROSCOPY TIME:  CT COMPLICATIONS: None PROCEDURE: The procedure risks, benefits, and alternatives were explained to the patient. Questions regarding the procedure were encouraged and answered. The patient understands and consents to the procedure. Scout CT of the pelvis was performed for surgical planning purposes. The right posterior pelvis was prepped with Chlorhexidine in a sterile fashion, and a sterile drape was applied covering the operative field. A sterile gown and sterile gloves were used for the procedure. Local anesthesia was provided with 1% Lidocaine. Right posterior iliac bone was targeted for biopsy. The skin and subcutaneous tissues were infiltrated with 1% lidocaine without epinephrine. A small stab incision was made with an 11 blade scalpel, and an 11 gauge Murphy needle was advanced with CT guidance to the posterior cortex. Manual forced was used to advance the needle through the  posterior cortex and the stylet was removed. A bone marrow aspirate was retrieved and passed to a cytotechnologist in the room. The Murphy needle was then advanced without the stylet for a core biopsy. The core biopsy was retrieved and also passed to a cytotechnologist. Manual pressure was used for hemostasis and a sterile dressing was placed. No complications were encountered no significant blood loss was encountered. Patient tolerated the procedure well  and remained hemodynamically stable throughout. IMPRESSION: Status post CT-guided bone marrow biopsy, with tissue specimen sent to pathology for complete histopathologic analysis Signed, Dulcy Fanny. Earleen Newport, DO [facility] Electronically Signed   By: Corrie Mckusick D.O.   On: 06/09/2020 09:58   ECHOCARDIOGRAM LIMITED  Result Date: 06/01/2020    ECHOCARDIOGRAM LIMITED REPORT   Patient Name:   Nancy Rodriguez Date of Exam: 06/01/2020 Medical Rec #:  [id number]         Height:       63.5 in Accession #:    [id number]        Weight:       146.5 lb Date of Birth:  1945/05/09          BSA:          1.704 m Patient Age:    33 years          BP:           143/83 mmHg Patient Gender: F                 HR:           84 bpm. Exam Location:  [facility] Procedure: 2D Echo, Cardiac Doppler, Color Doppler and Strain Analysis Indications:     Amyloidosis- unspecified type  History:         Patient has no prior history of Echocardiogram examinations.  Sonographer:     Sherrie Sport RDCS (AE) Referring Phys:  [id number] Cassadie Pankonin Diagnosing Phys: Kate Sable MD  Sonographer Comments: Global longitudinal strain was attempted. IMPRESSIONS  1. Left ventricular ejection fraction, by estimation, is 60 to 65%. The left ventricle has normal function. There is mild asymmetric left ventricular hypertrophy of the basal-septal segment. Left ventricular diastolic parameters are indeterminate. The average left ventricular global longitudinal  strain is -16.8 %.  2. Right ventricular systolic function is normal.  3. The mitral valve is normal in structure. Mild mitral valve regurgitation.  4. The aortic valve is tricuspid. Aortic valve regurgitation is not visualized. Mild aortic valve sclerosis is present, with no evidence of aortic valve stenosis.  5. The inferior vena cava is normal in size with greater than 50% respiratory variability, suggesting right atrial pressure of 3 mmHg. Conclusion(s)/Recommendation(s): No echocardiographic findings to suggest cardiac amyloid on this study. FINDINGS  Left Ventricle: Left ventricular ejection fraction, by estimation, is 60 to 65%. The left ventricle has normal function. The average left ventricular global longitudinal strain is -16.8 %. The left ventricular internal cavity size was normal in size. There is mild asymmetric left ventricular hypertrophy of the basal-septal segment. Left ventricular diastolic parameters are indeterminate. Right Ventricle: No increase in right ventricular wall thickness. Right ventricular systolic function is normal. Left Atrium: Left atrial size was normal in size. Right Atrium: Right atrial size was normal in size. Pericardium: There is no evidence of pericardial effusion. Mitral Valve: The mitral valve is normal in structure. Mild mitral valve regurgitation. Tricuspid Valve: The tricuspid valve is normal in structure. Tricuspid valve regurgitation is not demonstrated. Aortic Valve: The aortic valve is tricuspid. Aortic valve regurgitation is not visualized. Mild aortic valve sclerosis is present, with no evidence of aortic valve stenosis. Aortic valve mean gradient measures 5.0 mmHg. Aortic valve peak gradient measures 8.2 mmHg. Aortic valve area, by VTI measures 1.58 cm. Pulmonic Valve: The pulmonic valve was normal in structure. Pulmonic valve regurgitation is trivial. Aorta: The aortic root is normal in size and  structure. Venous: The inferior vena cava is normal in size with  greater than 50% respiratory variability, suggesting right atrial pressure of 3 mmHg. LEFT VENTRICLE PLAX 2D LVIDd:         4.28 cm  Diastology LVIDs:         2.44 cm  LV e' medial:    5.66 cm/s LV PW:         1.72 cm  LV E/e' medial:  13.6 LV IVS:        1.01 cm  LV e' lateral:   5.11 cm/s LVOT diam:     2.00 cm  LV E/e' lateral: 15.1 LV SV:         49 LV SV Index:   29       2D Longitudinal Strain LVOT Area:     3.14 cm 2D Strain GLS Avg:     -16.8 %                          3D Volume EF:                         3D EF:        62 %                         LV EDV:       115 ml                         LV ESV:       44 ml                         LV SV:        71 ml RIGHT VENTRICLE RV Basal diam:  3.11 cm RV S prime:     10.10 cm/s TAPSE (M-mode): 3.1 cm LEFT ATRIUM             Index       RIGHT ATRIUM           Index LA diam:        3.80 cm 2.23 cm/m  RA Area:     13.60 cm LA Vol (A2C):   47.7 ml 27.99 ml/m RA Volume:   32.60 ml  19.13 ml/m LA Vol (A4C):   46.4 ml 27.23 ml/m LA Biplane Vol: 49.3 ml 28.93 ml/m  AORTIC VALVE                    PULMONIC VALVE AV Area (Vmax):    1.62 cm     PV Vmax:        0.65 m/s AV Area (Vmean):   1.56 cm     PV Peak grad:   1.7 mmHg AV Area (VTI):     1.58 cm     RVOT Peak grad: 2 mmHg AV Vmax:           143.00 cm/s AV Vmean:          104.667 cm/s AV VTI:            0.309 m AV Peak Grad:      8.2 mmHg AV Mean Grad:      5.0 mmHg LVOT Vmax:         73.80 cm/s LVOT Vmean:        52.000 cm/s  LVOT VTI:          0.156 m LVOT/AV VTI ratio: 0.50  AORTA Ao Root diam: 3.10 cm MITRAL VALVE               TRICUSPID VALVE MV Area (PHT): 4.54 cm    TR Peak grad:   11.2 mmHg MV Decel Time: 167 msec    TR Vmax:        167.00 cm/s MV E velocity: 77.10 cm/s MV A velocity: 72.00 cm/s  SHUNTS MV E/A ratio:  1.07        Systemic VTI:  0.16 m                            Systemic Diam: 2.00 cm Kate Sable MD Electronically signed by Kate Sable MD Signature Date/Time: 06/01/2020/4:06:38  PM    Final    US Abdomen Limited RUQ (LIVER/GB)  Result Date: 03/28/2020 CLINICAL DATA:  Elevated liver enzymes EXAM: ULTRASOUND ABDOMEN LIMITED RIGHT UPPER QUADRANT COMPARISON:  None. FINDINGS: Gallbladder: Multiple mobile echogenic, shadowing gallstones within the gallbladder lumen measuring up to 4 mm in diameter. No pericholecystic fluid or wall thickening visualized. No sonographic Murphy sign noted by sonographer. Common bile duct: Diameter: 5 mm. Liver: Complex cyst with internal septations within the anterior aspect of the left hepatic lobe measuring 1.7 x 1.4 x 1.6 cm. Within normal limits in parenchymal echogenicity. Portal vein is patent on color Doppler imaging with normal direction of blood flow towards the liver. Other: None. IMPRESSION: 1. Complex 1.7 cm cyst within the anterior left hepatic lobe with internal septations. Comparison with prior outside cross-sectional imaging of the abdomen, if available, is recommended to assess stability. If there is no outside imaging available, a follow-up contrast enhanced MRI of the abdomen could be performed for further characterization. 2. Cholelithiasis without sonographic evidence of acute cholecystitis. Electronically Signed   By: Davina Poke D.O.   On: 03/28/2020 15:13      ASSESSMENT & PLAN:  1. MGUS (monoclonal gammopathy of unknown significance)   2. Light chain (AL) amyloidosis (HCC)   3. Goals of care, counseling/discussion    # AL-amyloidosis Diagnosis of AL amyloidosis was discussed with patient.  Patient has biopsy confirmed liver involvement, possible kidney involvement.  06/01/2020 2D echo result was reviewed and discussed with patient.  Patient has normal LVEF 60-65%. Mild asymmetric left ventricular hypertrophy.  Left ventricular diastolic parameters are indeterminate. average left ventricular global longitudinal strain is -16.8 %.  NT proBNP  was normal,  TSH slightly increased, normal T4 Normal factor X Normal  coags Troponin 19. Refer to cardiology for evaluation. May need cardiac MRI.  The diagnosis of amyloidosis and care plan were discussed with patient in detail.  NCCN guidelines were reviewed and shared with patient. She understands that amyloidosis is not curable. Treatment may help to reduce her symptoms and prolong her life. Discussed about options of referral to tertiary center for bone marrow transplant evaluation vs conservative measures with chemotherapy. Patient is not interested in bone marrow transplant. Chemotherapy education was provided.  We had discussed the composition of chemotherapy regimen-CyBorD-Daratumumab, length of chemo cycle, duration of treatment and the time to assess response to treatment.  I explained to the patient the risks and benefits of chemotherapy including all but not limited to allergic reaction, hair loss, nausea, vomiting, diarrhea, low blood counts, bleeding, neuropathy and risk of life threatening infection and even death, secondary malignancy etc. patient  agrees with proceeding with treatment.  The goal of treatment which is to palliate disease, disease related symptoms, improve quality of life and hopefully prolong life was highlighted in our discussion.  Patient voices understanding and willing to proceed chemotherapy.   # Chemotherapy education; we discussed about Mediport placement option as patient does not have good IV access. Patient later decided that she wants to try to utilize her peripheral IV for blood draw and IV access first and if not feasible, she will consider medi port placement. Antiemetics-Zofran and Compazine;  #Anxiety, refer to palliative care service. Supportive care measures are necessary for patient well-being and will be provided as necessary. We spent sufficient time to discuss many aspect of care, questions were answered to patient's satisfaction. I also talked to her son [name] and updated above.    Orders Placed This Encounter   Procedures   CBC with Differential/Platelet    Standing Status:   Future    Number of Occurrences:   1    Standing Expiration Date:   06/17/2021   Comprehensive metabolic panel    Standing Status:   Future    Number of Occurrences:   1    Standing Expiration Date:   06/17/2021   Pretreatment RBC phenotype    Standing Status:   Future    Number of Occurrences:   1    Standing Expiration Date:   06/17/2021    Order Specific Question:   Medication to be given:    Answer:   Daratumumab   Ambulatory referral to Cardiology    Referral Priority:   Routine    Referral Type:   Consultation    Referral Reason:   Specialty Services Required    Referred to Provider:   Nelva Bush, MD    Requested Specialty:   Cardiology    Number of Visits Requested:   1   Ambulatory referral to General Surgery    Referral Priority:   Routine    Referral Type:   Surgical    Referral Reason:   Specialty Services Required    Referred to Provider:   Herbert Pun, MD    Requested Specialty:   General Surgery    Number of Visits Requested:   1   Ambulatory Referral to Palliative Care    Referral Priority:   Routine    Referral Type:   Consultation    Referral Reason:   Advance Care Planning    Number of Visits Requested:   1   Type and screen    Standing Status:   Future    Number of Occurrences:   1    Standing Expiration Date:   06/17/2021    All questions were answered. The patient knows to call the clinic with any problems questions or concerns.  cc Leonel Ramsay, MD  We spent sufficient time to discuss many aspect of care, questions were answered to patient's satisfaction.   [name], MD, PhD Hematology Oncology [facility] at [facility] Pager- [phone] 06/17/2020

## 2020-06-17 NOTE — Progress Notes (Signed)
Pt here for follow up. No new concerns voiced.   

## 2020-06-17 NOTE — Progress Notes (Signed)
START ON PATHWAY REGIMEN - Multiple Myeloma and Other Plasma Cell Dyscrasias     Cycles 1 and 2: A cycle is every 28 days:     Cyclophosphamide      Dexamethasone      Daratumumab and hyaluronidase-fihj      Bortezomib    Cycles 3 through 6: A cycle is every 28 days:     Cyclophosphamide      Dexamethasone      Dexamethasone      Daratumumab and hyaluronidase-fihj      Bortezomib    Cycles 7 and beyond (up to 2 years): A cycle is every 28 days:     Daratumumab and hyaluronidase-fihj   **Always confirm dose/schedule in your pharmacy ordering system**  Patient Characteristics: Primary AL Amyloidosis, First Line, Ineligible for or Refused Transplant Disease Classification: Primary AL Amyloidosis Line of therapy: First Line Transplant Eligibility: Ineligible for or Refused Transplant Intent of Therapy: Non-Curative / Palliative Intent, Discussed with Patient 

## 2020-06-20 ENCOUNTER — Telehealth: Payer: Self-pay | Admitting: Oncology

## 2020-06-20 ENCOUNTER — Telehealth: Payer: Self-pay | Admitting: Pharmacist

## 2020-06-20 ENCOUNTER — Other Ambulatory Visit: Payer: Medicare Other

## 2020-06-20 DIAGNOSIS — E859 Amyloidosis, unspecified: Secondary | ICD-10-CM

## 2020-06-20 NOTE — Telephone Encounter (Signed)
Oral Oncology Pharmacist Encounter  Received new prescription for Cytoxan (cyclophosphamide) for the treatment of newly diagnosed amyloidosis in conjunction with bortezomib, daratumumab, and dexamethasone, planned duration until disease control or unacceptable drug toxicity.  Prescription dose and frequency assessed.   Current medication list in Epic reviewed, no relevant DDIs with cyclophosphamide identified.  Evaluated chart and no patient barriers to medication adherence identified.   Prescription has been e-scribed to the [facility] for benefits analysis and approval.  Oral Oncology Clinic will continue to follow for insurance authorization, copayment issues, initial counseling and start date.  Patient agreed to treatment on 06/17/20 per MD documentation.  Darl Pikes, PharmD, BCPS, BCOP, CPP Hematology/Oncology Clinical Pharmacist Practitioner [facility] [phone]  06/20/2020 10:25 AM

## 2020-06-20 NOTE — Telephone Encounter (Signed)
Oral Chemotherapy Pharmacist Encounter   Patient mentioned during her visit with Merrily Pew, NP that her supportive care medications were expensive. I called her when she returned home for some additional information and she mentioned in particular that her ondansetron was $129. I had Bethena Roys test claim her ondansetron in our pharmacy system and it came back with a copay of $3.41. I called her [facility] and they staff member reviewed her medications and realized they were not billing them to her insurance. Once this was fixed her copays were all less them $9. I called Mrs. Robley to let her know the copays were fixed.    Darl Pikes, PharmD, BCPS, BCOP, CPP Hematology/Oncology Clinical Pharmacist [facility] [phone]  06/22/2020 10:15 AM

## 2020-06-20 NOTE — Telephone Encounter (Signed)
Patient called and wanted clarification on treatment. She stated she was told that she would not be needing a port because her medication was going to be given by mouth. She has requested a call back from the nurse.

## 2020-06-21 ENCOUNTER — Inpatient Hospital Stay (HOSPITAL_BASED_OUTPATIENT_CLINIC_OR_DEPARTMENT_OTHER): Payer: Medicare Other | Admitting: Hospice and Palliative Medicine

## 2020-06-21 ENCOUNTER — Other Ambulatory Visit: Payer: Self-pay

## 2020-06-21 ENCOUNTER — Inpatient Hospital Stay: Payer: Medicare Other | Admitting: Pharmacist

## 2020-06-21 ENCOUNTER — Telehealth: Payer: Self-pay | Admitting: Pharmacy Technician

## 2020-06-21 ENCOUNTER — Encounter: Payer: Self-pay | Admitting: Hospice and Palliative Medicine

## 2020-06-21 ENCOUNTER — Other Ambulatory Visit: Payer: Self-pay | Admitting: Oncology

## 2020-06-21 VITALS — BP 137/68 | HR 79 | Temp 97.9°F

## 2020-06-21 DIAGNOSIS — Z5112 Encounter for antineoplastic immunotherapy: Secondary | ICD-10-CM | POA: Diagnosis not present

## 2020-06-21 DIAGNOSIS — E859 Amyloidosis, unspecified: Secondary | ICD-10-CM

## 2020-06-21 DIAGNOSIS — Z515 Encounter for palliative care: Secondary | ICD-10-CM | POA: Diagnosis not present

## 2020-06-21 MED ORDER — ONDANSETRON HCL 8 MG PO TABS: ORAL_TABLET | ORAL | 1 refills | Status: DC

## 2020-06-21 MED ORDER — MIRTAZAPINE 7.5 MG PO TABS: 7.5000 mg | ORAL_TABLET | Freq: Every day | ORAL | 2 refills | Status: DC

## 2020-06-21 MED ORDER — CYCLOPHOSPHAMIDE 50 MG PO CAPS: 500.0000 mg | ORAL_CAPSULE | ORAL | 5 refills | Status: DC

## 2020-06-21 MED ORDER — PROCHLORPERAZINE MALEATE 10 MG PO TABS: 10.0000 mg | ORAL_TABLET | Freq: Four times a day (QID) | ORAL | 1 refills | Status: DC | PRN

## 2020-06-21 MED ORDER — ACYCLOVIR 400 MG PO TABS: 400.0000 mg | ORAL_TABLET | Freq: Two times a day (BID) | ORAL | 5 refills | Status: DC

## 2020-06-21 NOTE — Progress Notes (Signed)
[place]  Telephone:([phone] [phone]   Name: [name] Date: 06/21/2020 MRN: [id number]  DOB: [birth year]  Patient Care Team: Leonel Ramsay, MD as PCP - General (Infectious Diseases)    REASON FOR CONSULTATION: Nancy Rodriguez is a 75 y.o. female with multiple medical problems including monoclonal gammopathy, CKD stage 3b, anemia, RA, diabetes, who was recently diagnosed with amyloidosis.  Option for bone marrow biopsy was discussed but patient declined.  She was started on systemic chemotherapy.  Patient was referred to palliative care to help address goals and manage ongoing symptoms.  SOCIAL HISTORY:     reports that she has never smoked. She has never used smokeless tobacco. She reports that she does not drink alcohol and does not use drugs.  Patient is widowed.  She currently lives at home with her daughter.  In total, patient has 4 sons and 2 daughters.  Patient previously lived in [place] and was an Mining engineer at [employer].  ADVANCE DIRECTIVES:  Does not have  CODE STATUS:   PAST MEDICAL HISTORY: Past Medical History:  Diagnosis Date   Anemia    Anemia in chronic kidney disease   Chronic kidney disease    Stage 3b chronic kidney disease   Diabetes mellitus without complication (HCC)    Hypercholesterolemia    Hypertension    MGUS (monoclonal gammopathy of unknown significance)    Osteoarthritis    Rheumatoid arthritis ([place])     PAST SURGICAL HISTORY:  Past Surgical History:  Procedure Laterality Date   HERNIA REPAIR     PARATHYROIDECTOMY      HEMATOLOGY/ONCOLOGY HISTORY:  Oncology History   No history exists.    ALLERGIES:  is allergic to benazepril, lisinopril, tolmetin, and nsaids.  MEDICATIONS:  Current Outpatient Medications  Medication Sig Dispense Refill   acyclovir (ZOVIRAX) 400 MG tablet Take 1 tablet (400 mg total) by mouth 2 (two) times daily. 60  tablet 5   albuterol (VENTOLIN HFA) 108 (90 Base) MCG/ACT inhaler Inhale 2 puffs into the lungs every 4 (four) hours as needed.     amLODipine (NORVASC) 5 MG tablet Take by mouth.     aspirin EC 81 MG tablet Take 81 mg by mouth daily. (Patient not taking: Reported on 06/17/2020)     canagliflozin (INVOKANA) 100 MG TABS tablet Take by mouth. (Patient not taking: No sig reported)     cyclophosphamide (CYTOXAN) 50 MG capsule Take 10 capsules (500 mg total) by mouth once a week. Take with breakfast. 10 capsule 5   diazepam (VALIUM) 5 MG tablet Take 1 tablet (5 mg total) by mouth every 8 (eight) hours as needed for muscle spasms. (Patient not taking: No sig reported) 20 tablet 0   docusate sodium (COLACE) 100 MG capsule Take 1 capsule by mouth as needed.     EPINEPHrine 0.3 mg/0.3 mL IJ SOAJ injection as needed (Patient not taking: No sig reported)     ferrous sulfate 325 (65 FE) MG EC tablet TAKE 1 TABLET BY MOUTH EVERY DAY (Patient not taking: Reported on 06/17/2020) 90 tablet 1   glipiZIDE (GLUCOTROL XL) 10 MG 24 hr tablet Take by mouth. 1 QAM 1 QPM (Patient not taking: Reported on 06/17/2020)     insulin glargine (LANTUS SOLOSTAR) 100 UNIT/ML Solostar Pen Inject 10 Units into the skin at bedtime.     metoprolol succinate (TOPROL-XL) 50 MG 24 hr tablet TAKE 1 TABLET (50 MG TOTAL) BY MOUTH  DAILY IN THE MORNING     ondansetron (ZOFRAN) 8 MG tablet Take 8 mg by mouth 30 to 60 min prior to Cytoxan administration then take 8 mg twice daily as needed for nausea and vomiting. 30 tablet 1   potassium chloride SA (KLOR-CON) 20 MEQ tablet Take 20 mEq by mouth daily. (Patient not taking: Reported on 06/17/2020)     prednisoLONE acetate (PRED FORTE) 1 % ophthalmic suspension Place 1 drop into both eyes daily.      prochlorperazine (COMPAZINE) 10 MG tablet Take 1 tablet (10 mg total) by mouth every 6 (six) hours as needed (Nausea or vomiting). 30 tablet 1   rosuvastatin (CRESTOR) 20 MG tablet Take 20 mg  by mouth daily.  (Patient not taking: No sig reported)     sulfamethoxazole-trimethoprim (BACTRIM DS) 800-160 MG tablet Take 1 tablet by mouth 2 (two) times daily. (Patient not taking: Reported on 06/17/2020)     traMADol (ULTRAM) 50 MG tablet Take 1 tablet (50 mg total) by mouth every 6 (six) hours as needed for severe pain. (Patient not taking: No sig reported) 8 tablet 0   No current facility-administered medications for this visit.    VITAL SIGNS: There were no vitals taken for this visit. There were no vitals filed for this visit.  Estimated body mass index is 26.26 kg/m as calculated from the following:   Height as of 06/09/20: 5' 3.5" (1.613 m).   Weight as of 06/17/20: 150 lb 9.6 oz (68.3 kg).  LABS: CBC:    Component Value Date/Time   WBC 6.2 06/17/2020 1139   HGB 11.6 (L) 06/17/2020 1139   HCT 34.9 (L) 06/17/2020 1139   PLT 430 (H) 06/17/2020 1139   MCV 74.7 (L) 06/17/2020 1139   NEUTROABS 3.9 06/17/2020 1139   LYMPHSABS 1.5 06/17/2020 1139   MONOABS 0.5 06/17/2020 1139   EOSABS 0.2 06/17/2020 1139   BASOSABS 0.1 06/17/2020 1139   Comprehensive Metabolic Panel:    Component Value Date/Time   NA 133 (L) 06/17/2020 1139   K 3.5 06/17/2020 1139   CL 100 06/17/2020 1139   CO2 24 06/17/2020 1139   BUN 17 06/17/2020 1139   CREATININE 0.97 06/17/2020 1139   GLUCOSE 452 (H) 06/17/2020 1139   CALCIUM 8.7 (L) 06/17/2020 1139   AST 52 (H) 06/17/2020 1139   ALT 29 06/17/2020 1139   ALKPHOS 846 (H) 06/17/2020 1139   BILITOT 1.0 06/17/2020 1139   PROT 6.3 (L) 06/17/2020 1139   ALBUMIN 2.1 (L) 06/17/2020 1139    RADIOGRAPHIC STUDIES: CT BONE MARROW BIOPSY & ASPIRATION  Result Date: 06/09/2020 INDICATION: 75 year old female with a history of monoclonal gammopathy referred for bone marrow biopsy EXAM: CT BONE MARROW BIOPSY AND ASPIRATION MEDICATIONS: None. ANESTHESIA/SEDATION: Moderate (conscious) sedation was employed during this procedure. A total of Versed 1.0 mg and  Fentanyl 50 mcg was administered intravenously. Moderate Sedation Time: 10 minutes. The patient's level of consciousness and vital signs were monitored continuously by radiology nursing throughout the procedure under my direct supervision. FLUOROSCOPY TIME:  CT COMPLICATIONS: None PROCEDURE: The procedure risks, benefits, and alternatives were explained to the patient. Questions regarding the procedure were encouraged and answered. The patient understands and consents to the procedure. Scout CT of the pelvis was performed for surgical planning purposes. The right posterior pelvis was prepped with Chlorhexidine in a sterile fashion, and a sterile drape was applied covering the operative field. A sterile gown and sterile gloves were used for the procedure. Local anesthesia was  provided with 1% Lidocaine. Right posterior iliac bone was targeted for biopsy. The skin and subcutaneous tissues were infiltrated with 1% lidocaine without epinephrine. A small stab incision was made with an 11 blade scalpel, and an 11 gauge Murphy needle was advanced with CT guidance to the posterior cortex. Manual forced was used to advance the needle through the posterior cortex and the stylet was removed. A bone marrow aspirate was retrieved and passed to a cytotechnologist in the room. The Murphy needle was then advanced without the stylet for a core biopsy. The core biopsy was retrieved and also passed to a cytotechnologist. Manual pressure was used for hemostasis and a sterile dressing was placed. No complications were encountered no significant blood loss was encountered. Patient tolerated the procedure well and remained hemodynamically stable throughout. IMPRESSION: Status post CT-guided bone marrow biopsy, with tissue specimen sent to pathology for complete histopathologic analysis Signed, Yvone Neu. Loreta Ave, DO [facility] Electronically Signed   By: Gilmer Mor D.O.   On:  06/09/2020 09:58   ECHOCARDIOGRAM LIMITED  Result Date: 06/01/2020    ECHOCARDIOGRAM LIMITED REPORT   Patient Name:   NAARA KELTY Date of Exam: 06/01/2020 Medical Rec #:  [id number]         Height:       63.5 in Accession #:    [id number]        Weight:       146.5 lb Date of Birth:  [birth year]          BSA:          1.704 m Patient Age:    74 years          BP:           143/83 mmHg Patient Gender: F                 HR:           84 bpm. Exam Location:  [facility] Procedure: 2D Echo, Cardiac Doppler, Color Doppler and Strain Analysis Indications:     Amyloidosis- unspecified type  History:         Patient has no prior history of Echocardiogram examinations.  Sonographer:     Cristela Blue RDCS (AE) Referring Phys:  [id number] ZHOU YU Diagnosing Phys: Debbe Odea MD  Sonographer Comments: Global longitudinal strain was attempted. IMPRESSIONS  1. Left ventricular ejection fraction, by estimation, is 60 to 65%. The left ventricle has normal function. There is mild asymmetric left ventricular hypertrophy of the basal-septal segment. Left ventricular diastolic parameters are indeterminate. The average left ventricular global longitudinal strain is -16.8 %.  2. Right ventricular systolic function is normal.  3. The mitral valve is normal in structure. Mild mitral valve regurgitation.  4. The aortic valve is tricuspid. Aortic valve regurgitation is not visualized. Mild aortic valve sclerosis is present, with no evidence of aortic valve stenosis.  5. The inferior vena cava is normal in size with greater than 50% respiratory variability, suggesting right atrial pressure of 3 mmHg. Conclusion(s)/Recommendation(s): No echocardiographic findings to suggest cardiac amyloid on this study. FINDINGS  Left Ventricle: Left ventricular ejection fraction, by estimation, is 60 to 65%. The left ventricle has normal function. The average left ventricular global longitudinal strain is -16.8 %. The left ventricular internal cavity size  was normal in size. There is mild asymmetric left ventricular hypertrophy of the basal-septal segment. Left ventricular diastolic parameters are indeterminate. Right Ventricle: No increase in right ventricular  wall thickness. Right ventricular systolic function is normal. Left Atrium: Left atrial size was normal in size. Right Atrium: Right atrial size was normal in size. Pericardium: There is no evidence of pericardial effusion. Mitral Valve: The mitral valve is normal in structure. Mild mitral valve regurgitation. Tricuspid Valve: The tricuspid valve is normal in structure. Tricuspid valve regurgitation is not demonstrated. Aortic Valve: The aortic valve is tricuspid. Aortic valve regurgitation is not visualized. Mild aortic valve sclerosis is present, with no evidence of aortic valve stenosis. Aortic valve mean gradient measures 5.0 mmHg. Aortic valve peak gradient measures 8.2 mmHg. Aortic valve area, by VTI measures 1.58 cm. Pulmonic Valve: The pulmonic valve was normal in structure. Pulmonic valve regurgitation is trivial. Aorta: The aortic root is normal in size and structure. Venous: The inferior vena cava is normal in size with greater than 50% respiratory variability, suggesting right atrial pressure of 3 mmHg. LEFT VENTRICLE PLAX 2D LVIDd:         4.28 cm  Diastology LVIDs:         2.44 cm  LV e' medial:    5.66 cm/s LV PW:         1.72 cm  LV E/e' medial:  13.6 LV IVS:        1.01 cm  LV e' lateral:   5.11 cm/s LVOT diam:     2.00 cm  LV E/e' lateral: 15.1 LV SV:         49 LV SV Index:   29       2D Longitudinal Strain LVOT Area:     3.14 cm 2D Strain GLS Avg:     -16.8 %                          3D Volume EF:                         3D EF:        62 %                         LV EDV:       115 ml                         LV ESV:       44 ml                         LV SV:        71 ml RIGHT VENTRICLE RV Basal diam:  3.11 cm RV S prime:     10.10 cm/s TAPSE (M-mode): 3.1 cm LEFT ATRIUM             Index        RIGHT ATRIUM           Index LA diam:        3.80 cm 2.23 cm/m  RA Area:     13.60 cm LA Vol (A2C):   47.7 ml 27.99 ml/m RA Volume:   32.60 ml  19.13 ml/m LA Vol (A4C):   46.4 ml 27.23 ml/m LA Biplane Vol: 49.3 ml 28.93 ml/m  AORTIC VALVE                    PULMONIC VALVE AV Area (Vmax):    1.62 cm  PV Vmax:        0.65 m/s AV Area (Vmean):   1.56 cm     PV Peak grad:   1.7 mmHg AV Area (VTI):     1.58 cm     RVOT Peak grad: 2 mmHg AV Vmax:           143.00 cm/s AV Vmean:          104.667 cm/s AV VTI:            0.309 m AV Peak Grad:      8.2 mmHg AV Mean Grad:      5.0 mmHg LVOT Vmax:         73.80 cm/s LVOT Vmean:        52.000 cm/s LVOT VTI:          0.156 m LVOT/AV VTI ratio: 0.50  AORTA Ao Root diam: 3.10 cm MITRAL VALVE               TRICUSPID VALVE MV Area (PHT): 4.54 cm    TR Peak grad:   11.2 mmHg MV Decel Time: 167 msec    TR Vmax:        167.00 cm/s MV E velocity: 77.10 cm/s MV A velocity: 72.00 cm/s  SHUNTS MV E/A ratio:  1.07        Systemic VTI:  0.16 m                            Systemic Diam: 2.00 cm Kate Sable MD Electronically signed by Kate Sable MD Signature Date/Time: 06/01/2020/4:06:38 PM    Final     PERFORMANCE STATUS (ECOG) : 1 - Symptomatic but completely ambulatory  Review of Systems Unless otherwise noted, a complete review of systems is negative.  Physical Exam General: NAD Pulmonary: Unlabored Extremities: no edema, no joint deformities Skin: no rashes Neurological: Weakness but otherwise nonfocal  IMPRESSION: I met with patient and daughter today.  Patient reports that she is doing reasonably well.  She denies any significant symptomatic complaints today.  She has agreed to initiate treatment with Dara CyBorD.  However, patient says that she has also considered not pursuing any treatment and letting nature take its course if there were little hope in a positive outcome.  I attempted to provide her with emotional support today and  encouragement.   Patient had many questions today regarding her medications and she spoke earlier with Clearnce Sorrel, PharmD.  Patient seems more comfortable now regarding her medications and treatment plan.   Patient endorses chronic insomnia, anxiety, and depression.  She denies previous treatment for depression.  I do note that she had diazepam on her med list but patient does not recall if she has ever taken this.  We discussed sleep hygiene in detail today.  Patient habitually watches television most of the night.  I suggested that she set a scheduled bedtime and avoid watching electronics for at least an hour prior to that time.  We will trial her on mirtazapine 7.5 mg at bedtime, which hopefully will help with her depression/anxiety and insomnia.  Patient may also benefit from appetite stimulation on this medication.  Patient is interested in being followed chronically by psychiatry.  Will refer her to psychiatry today.  I also think that patient will benefit from comprehensive support in the [place]. Will send referrals for nutrition. Discussed resources including transportation and [facility]. She has questions about possible hereditary link she  has read about amyloidosis.  Will send referral to genetics.  She also verbalized difficulty in affording the cost of her medications.  Will refer to [facility], [facility].   At baseline, patient lives at home with her daughter.  She is functionally independent with her own care.  She describes having good support from her children, although they live scattered across the country.  Patient does not have any advance directives.  I sent her home with ACP documents and a MOST form to review with family.  PLAN: -Continue current scope of treatment -Start mirtazapine 7.5 mg nightly -Sleep hygiene -Referral to psychiatry for anxiety/depression management -Referrals to nutrition, genetics, and financial support -ACP/MOST form reviewed -RTC next week  Case and plan  discussed with Dr. Tasia Catchings   Patient expressed understanding and was in agreement with this plan. She also understands that She can call the clinic at any time with any questions, concerns, or complaints.     Time Total: 60 minutes  Visit consisted of counseling and education dealing with the complex and emotionally intense issues of symptom management and palliative care in the setting of serious and potentially life-threatening illness.Greater than 50%  of this time was spent counseling and coordinating care related to the above assessment and plan.  Signed by: Altha Harm, PhD, NP-C

## 2020-06-21 NOTE — Telephone Encounter (Signed)
Patient was in clinic to see Josh today. I spoke to her and answered all her questions.

## 2020-06-21 NOTE — Progress Notes (Signed)
[place]  [phone] [phone]  Patient Care Team: Leonel Ramsay, MD as PCP - General (Infectious Diseases)   Name of the patient: Nancy Rodriguez  [id number]  06/03/1945   Date of visit: 06/21/20  HPI: Patient is a 75 y.o. female with newly diagnosed amyloidosis. Planned treatment with cyclophosphamide (PO), bortezomib, daratumumab, and dexamethasone.  Reason for Consult: Cyclophosphamide oral chemotherapy education.   PAST MEDICAL HISTORY: Past Medical History:  Diagnosis Date  . Anemia    Anemia in chronic kidney disease  . Chronic kidney disease    Stage 3b chronic kidney disease  . Diabetes mellitus without complication ([place])   . Hypercholesterolemia   . Hypertension   . MGUS (monoclonal gammopathy of unknown significance)   . Osteoarthritis   . Rheumatoid arthritis ([place])     PAST SURGICAL HISTORY:  Past Surgical History:  Procedure Laterality Date  . HERNIA REPAIR    . PARATHYROIDECTOMY      HEMATOLOGY/ONCOLOGY HISTORY:  Oncology History   No history exists.    ALLERGIES:  is allergic to benazepril, lisinopril, tolmetin, and nsaids.  MEDICATIONS:  Current Outpatient Medications  Medication Sig Dispense Refill  . acyclovir (ZOVIRAX) 400 MG tablet Take 1 tablet (400 mg total) by mouth 2 (two) times daily. (Patient not taking: Reported on 06/21/2020) 60 tablet 5  . albuterol (VENTOLIN HFA) 108 (90 Base) MCG/ACT inhaler Inhale 2 puffs into the lungs every 4 (four) hours as needed.    Marland Kitchen amLODipine (NORVASC) 5 MG tablet Take by mouth.    Marland Kitchen aspirin EC 81 MG tablet Take 81 mg by mouth daily. (Patient not taking: No sig reported)    . canagliflozin (INVOKANA) 100 MG TABS tablet Take by mouth. (Patient not taking: No sig reported)    . cyclophosphamide (CYTOXAN) 50 MG capsule Take 10 capsules (500 mg total) by mouth once a week. Take with breakfast. (Patient not taking: Reported on 06/21/2020)  10 capsule 5  . docusate sodium (COLACE) 100 MG capsule Take 1 capsule by mouth as needed.    Marland Kitchen EPINEPHrine 0.3 mg/0.3 mL IJ SOAJ injection as needed (Patient not taking: No sig reported)    . ferrous sulfate 325 (65 FE) MG EC tablet TAKE 1 TABLET BY MOUTH EVERY DAY (Patient not taking: No sig reported) 90 tablet 1  . glipiZIDE (GLUCOTROL XL) 10 MG 24 hr tablet Take by mouth. 1 QAM 1 QPM (Patient not taking: No sig reported)    . insulin glargine (LANTUS SOLOSTAR) 100 UNIT/ML Solostar Pen Inject 10 Units into the skin at bedtime.    . metoprolol succinate (TOPROL-XL) 50 MG 24 hr tablet TAKE 1 TABLET (50 MG TOTAL) BY MOUTH DAILY IN THE MORNING    . mirtazapine (REMERON) 7.5 MG tablet Take 1 tablet (7.5 mg total) by mouth at bedtime. 30 tablet 2  . ondansetron (ZOFRAN) 8 MG tablet Take 8 mg by mouth 30 to 60 min prior to Cytoxan administration then take 8 mg twice daily as needed for nausea and vomiting. (Patient not taking: Reported on 06/21/2020) 30 tablet 1  . potassium chloride SA (KLOR-CON) 20 MEQ tablet Take 20 mEq by mouth daily. (Patient not taking: No sig reported)    . prednisoLONE acetate (PRED FORTE) 1 % ophthalmic suspension Place 1 drop into both eyes daily.     . prochlorperazine (COMPAZINE) 10 MG tablet Take 1 tablet (10 mg total) by mouth every 6 (six) hours as needed (Nausea or vomiting). (  Patient not taking: Reported on 06/21/2020) 30 tablet 1  . rosuvastatin (CRESTOR) 20 MG tablet Take 20 mg by mouth daily.  (Patient not taking: No sig reported)    . sulfamethoxazole-trimethoprim (BACTRIM DS) 800-160 MG tablet Take 1 tablet by mouth 2 (two) times daily. (Patient not taking: No sig reported)    . traMADol (ULTRAM) 50 MG tablet Take 1 tablet (50 mg total) by mouth every 6 (six) hours as needed for severe pain. (Patient not taking: No sig reported) 8 tablet 0   No current facility-administered medications for this visit.    VITAL SIGNS: There were no vitals taken for this visit. There  were no vitals filed for this visit.  Estimated body mass index is 26.26 kg/m as calculated from the following:   Height as of 06/09/20: 5' 3.5" (1.613 m).   Weight as of 06/17/20: 68.3 kg (150 lb 9.6 oz).  LABS: CBC:    Component Value Date/Time   WBC 6.2 06/17/2020 1139   HGB 11.6 (L) 06/17/2020 1139   HCT 34.9 (L) 06/17/2020 1139   PLT 430 (H) 06/17/2020 1139   MCV 74.7 (L) 06/17/2020 1139   NEUTROABS 3.9 06/17/2020 1139   LYMPHSABS 1.5 06/17/2020 1139   MONOABS 0.5 06/17/2020 1139   EOSABS 0.2 06/17/2020 1139   BASOSABS 0.1 06/17/2020 1139   Comprehensive Metabolic Panel:    Component Value Date/Time   NA 133 (L) 06/17/2020 1139   K 3.5 06/17/2020 1139   CL 100 06/17/2020 1139   CO2 24 06/17/2020 1139   BUN 17 06/17/2020 1139   CREATININE 0.97 06/17/2020 1139   GLUCOSE 452 (H) 06/17/2020 1139   CALCIUM 8.7 (L) 06/17/2020 1139   AST 52 (H) 06/17/2020 1139   ALT 29 06/17/2020 1139   ALKPHOS 846 (H) 06/17/2020 1139   BILITOT 1.0 06/17/2020 1139   PROT 6.3 (L) 06/17/2020 1139   ALBUMIN 2.1 (L) 06/17/2020 1139    RADIOGRAPHIC STUDIES: CT BONE MARROW BIOPSY & ASPIRATION  Result Date: 06/09/2020 INDICATION: 75 year old female with a history of monoclonal gammopathy referred for bone marrow biopsy EXAM: CT BONE MARROW BIOPSY AND ASPIRATION MEDICATIONS: None. ANESTHESIA/SEDATION: Moderate (conscious) sedation was employed during this procedure. A total of Versed 1.0 mg and Fentanyl 50 mcg was administered intravenously. Moderate Sedation Time: 10 minutes. The patient's level of consciousness and vital signs were monitored continuously by radiology nursing throughout the procedure under my direct supervision. FLUOROSCOPY TIME:  CT COMPLICATIONS: None PROCEDURE: The procedure risks, benefits, and alternatives were explained to the patient. Questions regarding the procedure were encouraged and answered. The patient understands and consents to the procedure. Scout CT of the pelvis was  performed for surgical planning purposes. The right posterior pelvis was prepped with Chlorhexidine in a sterile fashion, and a sterile drape was applied covering the operative field. A sterile gown and sterile gloves were used for the procedure. Local anesthesia was provided with 1% Lidocaine. Right posterior iliac bone was targeted for biopsy. The skin and subcutaneous tissues were infiltrated with 1% lidocaine without epinephrine. A small stab incision was made with an 11 blade scalpel, and an 11 gauge Murphy needle was advanced with CT guidance to the posterior cortex. Manual forced was used to advance the needle through the posterior cortex and the stylet was removed. A bone marrow aspirate was retrieved and passed to a cytotechnologist in the room. The Murphy needle was then advanced without the stylet for a core biopsy. The core biopsy was retrieved and also passed  to a cytotechnologist. Manual pressure was used for hemostasis and a sterile dressing was placed. No complications were encountered no significant blood loss was encountered. Patient tolerated the procedure well and remained hemodynamically stable throughout. IMPRESSION: Status post CT-guided bone marrow biopsy, with tissue specimen sent to pathology for complete histopathologic analysis Signed, Dulcy Fanny. Earleen Newport, DO [facility] Electronically Signed   By: Corrie Mckusick D.O.   On: 06/09/2020 09:58   ECHOCARDIOGRAM LIMITED  Result Date: 06/01/2020    ECHOCARDIOGRAM LIMITED REPORT   Patient Name:   Nancy Rodriguez Date of Exam: 06/01/2020 Medical Rec #:  [id number]         Height:       63.5 in Accession #:    [id number]        Weight:       146.5 lb Date of Birth:  06/14/1945          BSA:          1.704 m Patient Age:    89 years          BP:           143/83 mmHg Patient Gender: F                 HR:           84 bpm. Exam Location:  [facility] Procedure: 2D Echo, Cardiac Doppler, Color Doppler  and Strain Analysis Indications:     Amyloidosis- unspecified type  History:         Patient has no prior history of Echocardiogram examinations.  Sonographer:     Sherrie Sport RDCS (AE) Referring Phys:  [id number] ZHOU YU Diagnosing Phys: Kate Sable MD  Sonographer Comments: Global longitudinal strain was attempted. IMPRESSIONS  1. Left ventricular ejection fraction, by estimation, is 60 to 65%. The left ventricle has normal function. There is mild asymmetric left ventricular hypertrophy of the basal-septal segment. Left ventricular diastolic parameters are indeterminate. The average left ventricular global longitudinal strain is -16.8 %.  2. Right ventricular systolic function is normal.  3. The mitral valve is normal in structure. Mild mitral valve regurgitation.  4. The aortic valve is tricuspid. Aortic valve regurgitation is not visualized. Mild aortic valve sclerosis is present, with no evidence of aortic valve stenosis.  5. The inferior vena cava is normal in size with greater than 50% respiratory variability, suggesting right atrial pressure of 3 mmHg. Conclusion(s)/Recommendation(s): No echocardiographic findings to suggest cardiac amyloid on this study. FINDINGS  Left Ventricle: Left ventricular ejection fraction, by estimation, is 60 to 65%. The left ventricle has normal function. The average left ventricular global longitudinal strain is -16.8 %. The left ventricular internal cavity size was normal in size. There is mild asymmetric left ventricular hypertrophy of the basal-septal segment. Left ventricular diastolic parameters are indeterminate. Right Ventricle: No increase in right ventricular wall thickness. Right ventricular systolic function is normal. Left Atrium: Left atrial size was normal in size. Right Atrium: Right atrial size was normal in size. Pericardium: There is no evidence of pericardial effusion. Mitral Valve: The mitral valve is normal in structure. Mild mitral valve regurgitation.  Tricuspid Valve: The tricuspid valve is normal in structure. Tricuspid valve regurgitation is not demonstrated. Aortic Valve: The aortic valve is tricuspid. Aortic valve regurgitation is not visualized. Mild aortic valve sclerosis is present, with no evidence of aortic valve stenosis. Aortic valve mean gradient measures 5.0 mmHg. Aortic valve peak gradient measures 8.2 mmHg.  Aortic valve area, by VTI measures 1.58 cm. Pulmonic Valve: The pulmonic valve was normal in structure. Pulmonic valve regurgitation is trivial. Aorta: The aortic root is normal in size and structure. Venous: The inferior vena cava is normal in size with greater than 50% respiratory variability, suggesting right atrial pressure of 3 mmHg. LEFT VENTRICLE PLAX 2D LVIDd:         4.28 cm  Diastology LVIDs:         2.44 cm  LV e' medial:    5.66 cm/s LV PW:         1.72 cm  LV E/e' medial:  13.6 LV IVS:        1.01 cm  LV e' lateral:   5.11 cm/s LVOT diam:     2.00 cm  LV E/e' lateral: 15.1 LV SV:         49 LV SV Index:   29       2D Longitudinal Strain LVOT Area:     3.14 cm 2D Strain GLS Avg:     -16.8 %                          3D Volume EF:                         3D EF:        62 %                         LV EDV:       115 ml                         LV ESV:       44 ml                         LV SV:        71 ml RIGHT VENTRICLE RV Basal diam:  3.11 cm RV S prime:     10.10 cm/s TAPSE (M-mode): 3.1 cm LEFT ATRIUM             Index       RIGHT ATRIUM           Index LA diam:        3.80 cm 2.23 cm/m  RA Area:     13.60 cm LA Vol (A2C):   47.7 ml 27.99 ml/m RA Volume:   32.60 ml  19.13 ml/m LA Vol (A4C):   46.4 ml 27.23 ml/m LA Biplane Vol: 49.3 ml 28.93 ml/m  AORTIC VALVE                    PULMONIC VALVE AV Area (Vmax):    1.62 cm     PV Vmax:        0.65 m/s AV Area (Vmean):   1.56 cm     PV Peak grad:   1.7 mmHg AV Area (VTI):     1.58 cm     RVOT Peak grad: 2 mmHg AV Vmax:           143.00 cm/s AV Vmean:          104.667 cm/s AV  VTI:            0.309 m AV Peak Grad:      8.2 mmHg AV Mean  Grad:      5.0 mmHg LVOT Vmax:         73.80 cm/s LVOT Vmean:        52.000 cm/s LVOT VTI:          0.156 m LVOT/AV VTI ratio: 0.50  AORTA Ao Root diam: 3.10 cm MITRAL VALVE               TRICUSPID VALVE MV Area (PHT): 4.54 cm    TR Peak grad:   11.2 mmHg MV Decel Time: 167 msec    TR Vmax:        167.00 cm/s MV E velocity: 77.10 cm/s MV A velocity: 72.00 cm/s  SHUNTS MV E/A ratio:  1.07        Systemic VTI:  0.16 m                            Systemic Diam: 2.00 cm Kate Sable MD Electronically signed by Kate Sable MD Signature Date/Time: 06/01/2020/4:06:38 PM    Final      Assessment and Plan-  Plan to start treatment on 06/29/20   Patient Education I spoke with patient for overview of new oral chemotherapy medication:Cytoxan (cyclophosphamide) for the treatment of newly diagnosed amyloidosis in conjunction with bortezomib, daratumumab, and dexamethasone, planned duration until disease control or unacceptable drug toxicity.   Patient's daughter Nancy Rodriguez (in person), son Nancy Rodriguez (facetime/telephone), and Nancy Rodriguez' pharmacist friend who works at [employer] (telephone) present during education. The pharmacist friend participated at the request of Nancy Rodriguez and with agreement of the patient. Reviewed the plan treatment with all participates and explained that today's visit was to dicsuss the cyclophosphamide and other take home medications. The other medication Nancy Rodriguez will be receiving in clinic will be discussed during her oral chemo education class on 06/23/20.  Pt is doing well. Counseled patient on administration, dosing, side effects, monitoring, drug-food interactions, safe handling, storage, and disposal. Patient will take: Cyclophosphamide: Take 10 capsules (500 mg total) by mouth once a week. Take with breakfast. Acyclovir: Take 1 tablet (400 mg total) by mouth 2 (two) times daily Ondansetron: Take 8 mg by mouth 30 to 60 min prior to  Cytoxan administration then take 8 mg twice daily as needed for nausea and vomiting. Prochlorperazine: Take 1 tablet (10 mg total) by mouth every 6 (six) hours as needed (Nausea or vomiting).  Cyclophosamide side effects include but not limited to: decreased wbc/hgb/plt, N/V, hair loss, decreased appetite.    Reviewed with patient importance of keeping a medication schedule and plan for any missed doses.  After discussion with patient one patient barriers to medication adherence identified. She has a lot of new medication to take at home and keep straight. Medication calendar will be provided to the patient to help her keep track of her medications. Also recommend  Nancy Rodriguez and her care support voiced understanding and appreciation. All questions answered. Medication handout provided. Medication handout was also provided to [facility] via email.   Provided patient with [facility] phone number. Patient knows to call the office with questions or concerns. Oral Chemotherapy Navigation Clinic will continue to follow.  Medication Access Issues: Cyclophosphamide has been approved by Medicare Part B  Patient expressed understanding and was in agreement with this plan. She also understands that She can call clinic at any time with any questions, concerns, or complaints.   Thank you for allowing me to participate in the care of  this very pleasant patient.   Time Total: 30mns  Visit consisted of counseling and education on dealing with issues of symptom management in the setting of serious and potentially life-threatening illness.Greater than 50%  of this time was spent counseling and coordinating care related to the above assessment and plan.  Signed by: ADarl Pikes PharmD, BCPS, BSalley Slaughter CPP Hematology/Oncology Clinical Pharmacist Practitioner [facility]/HP/[phone] 06/21/2020 4:42 PM

## 2020-06-21 NOTE — Telephone Encounter (Signed)
Oral Oncology Patient Advocate Encounter   Tried submitting a prior authorization for Cyclophosphamide through CoverMyMeds for [employer] and [employer] (part B drug plan).  Both plans denied authorization.  We have submitted for a fast appeal through [employer] to have Medicare cover the medication due to it being used for cancer.  Office note and denial faxed to [phone].  Phone number to follow up is [phone].  [place] [facility] [place] Phone [phone] Fax [phone] 06/22/2020 2:23 PM

## 2020-06-22 ENCOUNTER — Other Ambulatory Visit: Payer: Self-pay | Admitting: Oncology

## 2020-06-22 ENCOUNTER — Other Ambulatory Visit: Payer: Medicare Other

## 2020-06-22 ENCOUNTER — Encounter (HOSPITAL_COMMUNITY): Payer: Self-pay | Admitting: Oncology

## 2020-06-22 LAB — SURGICAL PATHOLOGY

## 2020-06-22 MED ORDER — CYCLOPHOSPHAMIDE 50 MG PO CAPS: 500.0000 mg | ORAL_CAPSULE | ORAL | 3 refills | Status: DC

## 2020-06-22 NOTE — Patient Instructions (Signed)
Cyclophosphamide Injection What is this medicine? CYCLOPHOSPHAMIDE (sye kloe FOSS fa mide) is a chemotherapy drug. It slows the growth of cancer cells. This medicine is used to treat many types of cancer like lymphoma, myeloma, leukemia, breast cancer, and ovarian cancer, to name a few. This medicine may be used for other purposes; ask your health care provider or pharmacist if you have questions. COMMON BRAND NAME(S): Cytoxan, Neosar What should I tell my health care provider before I take this medicine? They need to know if you have any of these conditions:  heart disease  history of irregular heartbeat  infection  kidney disease  liver disease  low blood counts, like white cells, platelets, or red blood cells  on hemodialysis  recent or ongoing radiation therapy  scarring or thickening of the lungs  trouble passing urine  an unusual or allergic reaction to cyclophosphamide, other medicines, foods, dyes, or preservatives  pregnant or trying to get pregnant  breast-feeding How should I use this medicine? This drug is usually given as an injection into a vein or muscle or by infusion into a vein. It is administered in a hospital or clinic by a specially trained health care professional. Talk to your pediatrician regarding the use of this medicine in children. Special care may be needed. Overdosage: If you think you have taken too much of this medicine contact a poison control center or emergency room at once. NOTE: This medicine is only for you. Do not share this medicine with others. What if I miss a dose? It is important not to miss your dose. Call your doctor or health care professional if you are unable to keep an appointment. What may interact with this medicine?  amphotericin B  azathioprine  certain antivirals for HIV or hepatitis  certain medicines for blood pressure, heart disease, irregular heart beat  certain medicines that treat or prevent blood clots  like warfarin  certain other medicines for cancer  cyclosporine  etanercept  indomethacin  medicines that relax muscles for surgery  medicines to increase blood counts  metronidazole This list may not describe all possible interactions. Give your health care provider a list of all the medicines, herbs, non-prescription drugs, or dietary supplements you use. Also tell them if you smoke, drink alcohol, or use illegal drugs. Some items may interact with your medicine. What should I watch for while using this medicine? Your condition will be monitored carefully while you are receiving this medicine. You may need blood work done while you are taking this medicine. Drink water or other fluids as directed. Urinate often, even at night. Some products may contain alcohol. Ask your health care professional if this medicine contains alcohol. Be sure to tell all health care professionals you are taking this medicine. Certain medicines, like metronidazole and disulfiram, can cause an unpleasant reaction when taken with alcohol. The reaction includes flushing, headache, nausea, vomiting, sweating, and increased thirst. The reaction can last from 30 minutes to several hours. Do not become pregnant while taking this medicine or for 1 year after stopping it. Women should inform their health care professional if they wish to become pregnant or think they might be pregnant. Men should not father a child while taking this medicine and for 4 months after stopping it. There is potential for serious side effects to an unborn child. Talk to your health care professional for more information. Do not breast-feed an infant while taking this medicine or for 1 week after stopping it. This medicine has  caused ovarian failure in some women. This medicine may make it more difficult to get pregnant. Talk to your health care professional if you are concerned about your fertility. This medicine has caused decreased sperm  counts in some men. This may make it more difficult to father a child. Talk to your health care professional if you are concerned about your fertility. Call your health care professional for advice if you get a fever, chills, or sore throat, or other symptoms of a cold or flu. Do not treat yourself. This medicine decreases your body's ability to fight infections. Try to avoid being around people who are sick. Avoid taking medicines that contain aspirin, acetaminophen, ibuprofen, naproxen, or ketoprofen unless instructed by your health care professional. These medicines may hide a fever. Talk to your health care professional about your risk of cancer. You may be more at risk for certain types of cancer if you take this medicine. If you are going to need surgery or other procedure, tell your health care professional that you are using this medicine. Be careful brushing or flossing your teeth or using a toothpick because you may get an infection or bleed more easily. If you have any dental work done, tell your dentist you are receiving this medicine. What side effects may I notice from receiving this medicine? Side effects that you should report to your doctor or health care professional as soon as possible:  allergic reactions like skin rash, itching or hives, swelling of the face, lips, or tongue  breathing problems  nausea, vomiting  signs and symptoms of bleeding such as bloody or black, tarry stools; red or dark brown urine; spitting up blood or brown material that looks like coffee grounds; red spots on the skin; unusual bruising or bleeding from the eyes, gums, or nose  signs and symptoms of heart failure like fast, irregular heartbeat, sudden weight gain; swelling of the ankles, feet, hands  signs and symptoms of infection like fever; chills; cough; sore throat; pain or trouble passing urine  signs and symptoms of kidney injury like trouble passing urine or change in the amount of  urine  signs and symptoms of liver injury like dark yellow or brown urine; general ill feeling or flu-like symptoms; light-colored stools; loss of appetite; nausea; right upper belly pain; unusually weak or tired; yellowing of the eyes or skin Side effects that usually do not require medical attention (report to your doctor or health care professional if they continue or are bothersome):  confusion  decreased hearing  diarrhea  facial flushing  hair loss  headache  loss of appetite  missed menstrual periods  signs and symptoms of low red blood cells or anemia such as unusually weak or tired; feeling faint or lightheaded; falls  skin discoloration This list may not describe all possible side effects. Call your doctor for medical advice about side effects. You may report side effects to FDA at 1-800-FDA-1088. Where should I keep my medicine? This drug is given in a hospital or clinic and will not be stored at home. NOTE: This sheet is a summary. It may not cover all possible information. If you have questions about this medicine, talk to your doctor, pharmacist, or health care provider.  2021 [employer] (2019-01-05 09:53:29) Bortezomib injection What is this medicine? BORTEZOMIB (bor TEZ oh mib) targets proteins in cancer cells and stops the cancer cells from growing. It treats multiple myeloma and mantle cell lymphoma. This medicine may be used for other purposes; ask  your health care provider or pharmacist if you have questions. COMMON BRAND NAME(S): Velcade What should I tell my health care provider before I take this medicine? They need to know if you have any of these conditions:  dehydration  diabetes (high blood sugar)  heart disease  liver disease  tingling of the fingers or toes or other nerve disorder  an unusual or allergic reaction to bortezomib, mannitol, boron, other medicines, foods, dyes, or preservatives  pregnant or trying to get  pregnant  breast-feeding How should I use this medicine? This medicine is injected into a vein or under the skin. It is given by a health care provider in a hospital or clinic setting. Talk to your health care provider about the use of this medicine in children. Special care may be needed. Overdosage: If you think you have taken too much of this medicine contact a poison control center or emergency room at once. NOTE: This medicine is only for you. Do not share this medicine with others. What if I miss a dose? Keep appointments for follow-up doses. It is important not to miss your dose. Call your health care provider if you are unable to keep an appointment. What may interact with this medicine? This medicine may interact with the following medications:  ketoconazole  rifampin This list may not describe all possible interactions. Give your health care provider a list of all the medicines, herbs, non-prescription drugs, or dietary supplements you use. Also tell them if you smoke, drink alcohol, or use illegal drugs. Some items may interact with your medicine. What should I watch for while using this medicine? Your condition will be monitored carefully while you are receiving this medicine. You may need blood work done while you are taking this medicine. You may get drowsy or dizzy. Do not drive, use machinery, or do anything that needs mental alertness until you know how this medicine affects you. Do not stand up or sit up quickly, especially if you are an older patient. This reduces the risk of dizzy or fainting spells This medicine may increase your risk of getting an infection. Call your health care provider for advice if you get a fever, chills, sore throat, or other symptoms of a cold or flu. Do not treat yourself. Try to avoid being around people who are sick. Check with your health care provider if you have severe diarrhea, nausea, and vomiting, or if you sweat a lot. The loss of too much  body fluid may make it dangerous for you to take this medicine. Do not become pregnant while taking this medicine or for 7 months after stopping it. Women should inform their health care provider if they wish to become pregnant or think they might be pregnant. Men should not father a child while taking this medicine and for 4 months after stopping it. There is a potential for serious harm to an unborn child. Talk to your health care provider for more information. Do not breast-feed an infant while taking this medicine or for 2 months after stopping it. This medicine may make it more difficult to get pregnant or father a child. Talk to your health care provider if you are concerned about your fertility. What side effects may I notice from receiving this medicine? Side effects that you should report to your doctor or health care professional as soon as possible:  allergic reactions (skin rash; itching or hives; swelling of the face, lips, or tongue)  bleeding (bloody or black, tarry stools;  red or dark brown urine; spitting up blood or brown material that looks like coffee grounds; red spots on the skin; unusual bruising or bleeding from the eye, gums, or nose)  blurred vision or changes in vision  confusion  constipation  headache  heart failure (trouble breathing; fast, irregular heartbeat; sudden weight gain; swelling of the ankles, feet, hands)  infection (fever, chills, cough, sore throat, pain or trouble passing urine)  lack or loss of appetite  liver injury (dark yellow or brown urine; general ill feeling or flu-like symptoms; loss of appetite, right upper belly pain; yellowing of the eyes or skin)  low blood pressure (dizziness; feeling faint or lightheaded, falls; unusually weak or tired)  muscle cramps  pain, redness, or irritation at site where injected  pain, tingling, numbness in the hands or feet  seizures  trouble breathing  unusual bruising or bleeding Side  effects that usually do not require medical attention (report to your doctor or health care professional if they continue or are bothersome):  diarrhea  nausea  stomach pain  trouble sleeping  vomiting This list may not describe all possible side effects. Call your doctor for medical advice about side effects. You may report side effects to FDA at 1-800-FDA-1088. Where should I keep my medicine? This medicine is given in a hospital or clinic. It will not be stored at home. NOTE: This sheet is a summary. It may not cover all possible information. If you have questions about this medicine, talk to your doctor, pharmacist, or health care provider.  2021 [employer] (2020-03-24 13:22:53)

## 2020-06-23 ENCOUNTER — Ambulatory Visit: Payer: Medicare Other | Admitting: Oncology

## 2020-06-23 ENCOUNTER — Inpatient Hospital Stay: Payer: Medicare Other

## 2020-06-23 ENCOUNTER — Inpatient Hospital Stay: Payer: Medicare Other | Admitting: Pharmacist

## 2020-06-23 ENCOUNTER — Other Ambulatory Visit: Payer: Self-pay

## 2020-06-23 DIAGNOSIS — E859 Amyloidosis, unspecified: Secondary | ICD-10-CM

## 2020-06-23 NOTE — Progress Notes (Signed)
[place]  [phone] [phone]  Patient Care Team: Leonel Ramsay, MD as PCP - General (Infectious Diseases)   Name of the patient: Nancy Rodriguez  [id number]  12-Oct-1945   Date of visit: 06/23/20  HPI: Patient is a 75 y.o. female with newly diagnosed amyloidosis. Planned treatment with cyclophosphamide (PO), bortezomib, daratumumab, and dexamethasone.  Reason for Consult: Cyclophosphamide oral chemotherapy education.   PAST MEDICAL HISTORY: Past Medical History:  Diagnosis Date  . Anemia    Anemia in chronic kidney disease  . Chronic kidney disease    Stage 3b chronic kidney disease  . Diabetes mellitus without complication ([place])   . Hypercholesterolemia   . Hypertension   . MGUS (monoclonal gammopathy of unknown significance)   . Osteoarthritis   . Rheumatoid arthritis ([place])     PAST SURGICAL HISTORY:  Past Surgical History:  Procedure Laterality Date  . HERNIA REPAIR    . PARATHYROIDECTOMY      HEMATOLOGY/ONCOLOGY HISTORY:  Oncology History   No history exists.    ALLERGIES:  is allergic to benazepril, lisinopril, tolmetin, and nsaids.  MEDICATIONS:  Current Outpatient Medications  Medication Sig Dispense Refill  . acyclovir (ZOVIRAX) 400 MG tablet Take 1 tablet (400 mg total) by mouth 2 (two) times daily. (Patient not taking: Reported on 06/21/2020) 60 tablet 5  . albuterol (VENTOLIN HFA) 108 (90 Base) MCG/ACT inhaler Inhale 2 puffs into the lungs every 4 (four) hours as needed.    Marland Kitchen amLODipine (NORVASC) 5 MG tablet Take by mouth.    Marland Kitchen aspirin EC 81 MG tablet Take 81 mg by mouth daily. (Patient not taking: No sig reported)    . canagliflozin (INVOKANA) 100 MG TABS tablet Take by mouth. (Patient not taking: No sig reported)    . cyclophosphamide (CYTOXAN) 50 MG capsule Take 10 capsules (500 mg total) by mouth once a week. Take with breakfast. 40 capsule 3  . docusate sodium (COLACE)  100 MG capsule Take 1 capsule by mouth as needed.    Marland Kitchen EPINEPHrine 0.3 mg/0.3 mL IJ SOAJ injection as needed (Patient not taking: No sig reported)    . ferrous sulfate 325 (65 FE) MG EC tablet TAKE 1 TABLET BY MOUTH EVERY DAY (Patient not taking: No sig reported) 90 tablet 1  . glipiZIDE (GLUCOTROL XL) 10 MG 24 hr tablet Take by mouth. 1 QAM 1 QPM (Patient not taking: No sig reported)    . insulin glargine (LANTUS SOLOSTAR) 100 UNIT/ML Solostar Pen Inject 10 Units into the skin at bedtime.    . metoprolol succinate (TOPROL-XL) 50 MG 24 hr tablet TAKE 1 TABLET (50 MG TOTAL) BY MOUTH DAILY IN THE MORNING    . mirtazapine (REMERON) 7.5 MG tablet Take 1 tablet (7.5 mg total) by mouth at bedtime. 30 tablet 2  . ondansetron (ZOFRAN) 8 MG tablet Take 8 mg by mouth 30 to 60 min prior to Cytoxan administration then take 8 mg twice daily as needed for nausea and vomiting. (Patient not taking: Reported on 06/21/2020) 30 tablet 1  . potassium chloride SA (KLOR-CON) 20 MEQ tablet Take 20 mEq by mouth daily. (Patient not taking: No sig reported)    . prednisoLONE acetate (PRED FORTE) 1 % ophthalmic suspension Place 1 drop into both eyes daily.     . prochlorperazine (COMPAZINE) 10 MG tablet Take 1 tablet (10 mg total) by mouth every 6 (six) hours as needed (Nausea or vomiting). (Patient not taking: Reported on 06/21/2020)  30 tablet 1  . rosuvastatin (CRESTOR) 20 MG tablet Take 20 mg by mouth daily.  (Patient not taking: No sig reported)    . sulfamethoxazole-trimethoprim (BACTRIM DS) 800-160 MG tablet Take 1 tablet by mouth 2 (two) times daily. (Patient not taking: No sig reported)    . traMADol (ULTRAM) 50 MG tablet Take 1 tablet (50 mg total) by mouth every 6 (six) hours as needed for severe pain. (Patient not taking: No sig reported) 8 tablet 0   No current facility-administered medications for this visit.    VITAL SIGNS: There were no vitals taken for this visit. There were no vitals filed for this visit.   Estimated body mass index is 26.26 kg/m as calculated from the following:   Height as of 06/09/20: 5' 3.5" (1.613 m).   Weight as of 06/17/20: 68.3 kg (150 lb 9.6 oz).  LABS: CBC:    Component Value Date/Time   WBC 6.2 06/17/2020 1139   HGB 11.6 (L) 06/17/2020 1139   HCT 34.9 (L) 06/17/2020 1139   PLT 430 (H) 06/17/2020 1139   MCV 74.7 (L) 06/17/2020 1139   NEUTROABS 3.9 06/17/2020 1139   LYMPHSABS 1.5 06/17/2020 1139   MONOABS 0.5 06/17/2020 1139   EOSABS 0.2 06/17/2020 1139   BASOSABS 0.1 06/17/2020 1139   Comprehensive Metabolic Panel:    Component Value Date/Time   NA 133 (L) 06/17/2020 1139   K 3.5 06/17/2020 1139   CL 100 06/17/2020 1139   CO2 24 06/17/2020 1139   BUN 17 06/17/2020 1139   CREATININE 0.97 06/17/2020 1139   GLUCOSE 452 (H) 06/17/2020 1139   CALCIUM 8.7 (L) 06/17/2020 1139   AST 52 (H) 06/17/2020 1139   ALT 29 06/17/2020 1139   ALKPHOS 846 (H) 06/17/2020 1139   BILITOT 1.0 06/17/2020 1139   PROT 6.3 (L) 06/17/2020 1139   ALBUMIN 2.1 (L) 06/17/2020 1139    RADIOGRAPHIC STUDIES: CT BONE MARROW BIOPSY & ASPIRATION  Result Date: 06/09/2020 INDICATION: 75 year old female with a history of monoclonal gammopathy referred for bone marrow biopsy EXAM: CT BONE MARROW BIOPSY AND ASPIRATION MEDICATIONS: None. ANESTHESIA/SEDATION: Moderate (conscious) sedation was employed during this procedure. A total of Versed 1.0 mg and Fentanyl 50 mcg was administered intravenously. Moderate Sedation Time: 10 minutes. The patient's level of consciousness and vital signs were monitored continuously by radiology nursing throughout the procedure under my direct supervision. FLUOROSCOPY TIME:  CT COMPLICATIONS: None PROCEDURE: The procedure risks, benefits, and alternatives were explained to the patient. Questions regarding the procedure were encouraged and answered. The patient understands and consents to the procedure. Scout CT of the pelvis was performed for surgical planning  purposes. The right posterior pelvis was prepped with Chlorhexidine in a sterile fashion, and a sterile drape was applied covering the operative field. A sterile gown and sterile gloves were used for the procedure. Local anesthesia was provided with 1% Lidocaine. Right posterior iliac bone was targeted for biopsy. The skin and subcutaneous tissues were infiltrated with 1% lidocaine without epinephrine. A small stab incision was made with an 11 blade scalpel, and an 11 gauge Murphy needle was advanced with CT guidance to the posterior cortex. Manual forced was used to advance the needle through the posterior cortex and the stylet was removed. A bone marrow aspirate was retrieved and passed to a cytotechnologist in the room. The Murphy needle was then advanced without the stylet for a core biopsy. The core biopsy was retrieved and also passed to a cytotechnologist. Manual pressure was  used for hemostasis and a sterile dressing was placed. No complications were encountered no significant blood loss was encountered. Patient tolerated the procedure well and remained hemodynamically stable throughout. IMPRESSION: Status post CT-guided bone marrow biopsy, with tissue specimen sent to pathology for complete histopathologic analysis Signed, Dulcy Fanny. Earleen Newport, DO [facility] Radiology Electronically Signed   By: Corrie Mckusick D.O.   On: 06/09/2020 09:58   ECHOCARDIOGRAM LIMITED  Result Date: 06/01/2020    ECHOCARDIOGRAM LIMITED REPORT   Patient Name:   CHARLIE CHAR Date of Exam: 06/01/2020 Medical Rec #:  [id number]         Height:       63.5 in Accession #:    [id number]        Weight:       146.5 lb Date of Birth:  06-07-1945          BSA:          1.704 m Patient Age:    50 years          BP:           143/83 mmHg Patient Gender: F                 HR:           84 bpm. Exam Location:  [facility] Procedure: 2D Echo, Cardiac Doppler, Color Doppler and Strain Analysis  Indications:     Amyloidosis- unspecified type  History:         Patient has no prior history of Echocardiogram examinations.  Sonographer:     Sherrie Sport RDCS (AE) Referring Phys:  [id number] ZHOU YU Diagnosing Phys: Kate Sable MD  Sonographer Comments: Global longitudinal strain was attempted. IMPRESSIONS  1. Left ventricular ejection fraction, by estimation, is 60 to 65%. The left ventricle has normal function. There is mild asymmetric left ventricular hypertrophy of the basal-septal segment. Left ventricular diastolic parameters are indeterminate. The average left ventricular global longitudinal strain is -16.8 %.  2. Right ventricular systolic function is normal.  3. The mitral valve is normal in structure. Mild mitral valve regurgitation.  4. The aortic valve is tricuspid. Aortic valve regurgitation is not visualized. Mild aortic valve sclerosis is present, with no evidence of aortic valve stenosis.  5. The inferior vena cava is normal in size with greater than 50% respiratory variability, suggesting right atrial pressure of 3 mmHg. Conclusion(s)/Recommendation(s): No echocardiographic findings to suggest cardiac amyloid on this study. FINDINGS  Left Ventricle: Left ventricular ejection fraction, by estimation, is 60 to 65%. The left ventricle has normal function. The average left ventricular global longitudinal strain is -16.8 %. The left ventricular internal cavity size was normal in size. There is mild asymmetric left ventricular hypertrophy of the basal-septal segment. Left ventricular diastolic parameters are indeterminate. Right Ventricle: No increase in right ventricular wall thickness. Right ventricular systolic function is normal. Left Atrium: Left atrial size was normal in size. Right Atrium: Right atrial size was normal in size. Pericardium: There is no evidence of pericardial effusion. Mitral Valve: The mitral valve is normal in structure. Mild mitral valve regurgitation. Tricuspid Valve: The  tricuspid valve is normal in structure. Tricuspid valve regurgitation is not demonstrated. Aortic Valve: The aortic valve is tricuspid. Aortic valve regurgitation is not visualized. Mild aortic valve sclerosis is present, with no evidence of aortic valve stenosis. Aortic valve mean gradient measures 5.0 mmHg. Aortic valve peak gradient measures 8.2 mmHg. Aortic valve area, by VTI measures  1.58 cm. Pulmonic Valve: The pulmonic valve was normal in structure. Pulmonic valve regurgitation is trivial. Aorta: The aortic root is normal in size and structure. Venous: The inferior vena cava is normal in size with greater than 50% respiratory variability, suggesting right atrial pressure of 3 mmHg. LEFT VENTRICLE PLAX 2D LVIDd:         4.28 cm  Diastology LVIDs:         2.44 cm  LV e' medial:    5.66 cm/s LV PW:         1.72 cm  LV E/e' medial:  13.6 LV IVS:        1.01 cm  LV e' lateral:   5.11 cm/s LVOT diam:     2.00 cm  LV E/e' lateral: 15.1 LV SV:         49 LV SV Index:   29       2D Longitudinal Strain LVOT Area:     3.14 cm 2D Strain GLS Avg:     -16.8 %                          3D Volume EF:                         3D EF:        62 %                         LV EDV:       115 ml                         LV ESV:       44 ml                         LV SV:        71 ml RIGHT VENTRICLE RV Basal diam:  3.11 cm RV S prime:     10.10 cm/s TAPSE (M-mode): 3.1 cm LEFT ATRIUM             Index       RIGHT ATRIUM           Index LA diam:        3.80 cm 2.23 cm/m  RA Area:     13.60 cm LA Vol (A2C):   47.7 ml 27.99 ml/m RA Volume:   32.60 ml  19.13 ml/m LA Vol (A4C):   46.4 ml 27.23 ml/m LA Biplane Vol: 49.3 ml 28.93 ml/m  AORTIC VALVE                    PULMONIC VALVE AV Area (Vmax):    1.62 cm     PV Vmax:        0.65 m/s AV Area (Vmean):   1.56 cm     PV Peak grad:   1.7 mmHg AV Area (VTI):     1.58 cm     RVOT Peak grad: 2 mmHg AV Vmax:           143.00 cm/s AV Vmean:          104.667 cm/s AV VTI:            0.309 m  AV Peak Grad:      8.2 mmHg AV Mean Grad:  5.0 mmHg LVOT Vmax:         73.80 cm/s LVOT Vmean:        52.000 cm/s LVOT VTI:          0.156 m LVOT/AV VTI ratio: 0.50  AORTA Ao Root diam: 3.10 cm MITRAL VALVE               TRICUSPID VALVE MV Area (PHT): 4.54 cm    TR Peak grad:   11.2 mmHg MV Decel Time: 167 msec    TR Vmax:        167.00 cm/s MV E velocity: 77.10 cm/s MV A velocity: 72.00 cm/s  SHUNTS MV E/A ratio:  1.07        Systemic VTI:  0.16 m                            Systemic Diam: 2.00 cm Kate Sable MD Electronically signed by Kate Sable MD Signature Date/Time: 06/01/2020/4:06:38 PM    Final      Assessment and Plan-  Plan to start treatment on 06/29/20   Patient Education (re-education) Provided patient with medication calendar. The calendar takes her through her weekly medication schedule and covers her acyclovir, ondansetron, prochlorperazine, cyclophosphamide, daratumumab, bortezomib, and dexamethasone. Schedule for treatment reviewed during the visit.  Present for education: Mrs. Iannelli, daughter [name] (in person), and son [name] (telephone). Patient also wanted to have her other son [name] educated. She called him and he was also educated via telephone.   Re-reviewed the dosing/administration of the below medications: Cyclophosphamide: Take 10 capsules (500 mg total) by mouth once a week. Take with breakfast. Acyclovir: Take 1 tablet (400 mg total) by mouth 2 (two) times daily Ondansetron: Take 8 mg by mouth 30 to 60 min prior to Cytoxan administration then take 8 mg twice daily as needed for nausea and vomiting. Prochlorperazine: Take 1 tablet (10 mg total) by mouth every 6 (six) hours as needed (Nausea or vomiting).  Medication Access Issues: We are still awaiting Part B medication approval. Patient has picked up the above supportive medications her local pharmacy.  Other (family concerns):  - Patient's son [name] wanted to speak with someone about social services for  his mother. I emailed Elease Etienne who plans on reaching out the the son next week.  - Her other son [name] would also like for his mother to receive a referral for a second opinion. Janeann Merl, RN notified of the request and she will coordinated with Dr. Tasia Catchings. I confirmed they do not want to halt treatment to await the referral.  - [name] also asked questions about who and where the biopsy for his mother was reviewed. I explained that we have a pathology department that processes and evaluates the biopsy.   Mrs. Begnaud and her care support voiced understanding/appreciation. All questions answered. Medication handout provided.   Provided patient with [facility] phone number. Patient knows to call the office with questions or concerns. [facility] will continue to follow.  Patient expressed understanding and was in agreement with this plan. She also understands that She can call clinic at any time with any questions, concerns, or complaints.   Thank you for allowing me to participate in the care of this very pleasant patient.   Time Total: 45 mins  Visit consisted of counseling and education on dealing with issues of symptom management in the setting of serious and potentially life-threatening illness.Greater  than 50%  of this time was spent counseling and coordinating care related to the above assessment and plan.  Signed by: Darl Pikes, PharmD, BCPS, Salley Slaughter, CPP Hematology/Oncology Clinical Pharmacist Practitioner [facility]/HP/AP [facility] [phone]  06/23/2020 4:15 PM

## 2020-06-24 ENCOUNTER — Ambulatory Visit: Admit: 2020-06-24 | Payer: Medicare Other | Admitting: General Surgery

## 2020-06-24 SURGERY — INSERTION, TUNNELED CENTRAL VENOUS DEVICE, WITH PORT
Anesthesia: General | Site: Chest

## 2020-06-24 NOTE — Telephone Encounter (Signed)
Received a call from [employer] that Cyclophosphamide PA denial has been overturned and is now approved.  [employer] will mail the letter of approval in the next few days with eligibility dates.  PA# [id number]  Nurse is E. Lewis and can be reached at [phone] for additional details regarding the appeal approval.  Patient's copay is $0.00.  [place] Patient [place] Phone [phone] Fax [phone] 06/24/2020 4:26 PM

## 2020-06-27 NOTE — Telephone Encounter (Signed)
Oral Oncology Patient Advocate Encounter  I spoke with Mrs Rubiano this morning to set up delivery of Cyclophosphamide.  Address verified for shipment and will be filled through [facility] and mailed 06/27/20 for delivery 06/28/20.    [facility] will call 7-10 days before next refill is due to complete adherence call and set up delivery of medication.     [place] Patient [place] Phone [phone] Fax [phone] 06/27/2020 11:38 AM

## 2020-06-28 ENCOUNTER — Telehealth: Payer: Self-pay

## 2020-06-28 NOTE — Telephone Encounter (Signed)
Patient verified medication was delivered today.  [place] Patient [place] Phone [phone] Fax [phone] 06/28/2020 5:00 PM

## 2020-06-28 NOTE — Telephone Encounter (Signed)
Referral for second opinion for Amyloidosis faxed to [facility].   Ph: [phone] Fax: [phone]

## 2020-06-28 NOTE — Telephone Encounter (Signed)
-----   Message from [name], MD sent at 06/27/2020 11:36 PM EDT ----- Please send her to [place] malignant hematology. No specified oncologist. They will triage.  ----- Message ----- From: Evelina Dun, RN Sent: 06/23/2020   1:37 PM EDT To: Evelina Dun, RN, Vanice Sarah, CMA, #  Per N[place]a Alpha, pt's son is requesting a second opinion at [facility]. She states that he had a lot of questions regarding tx and path report. He also told her that he did not want second opinion to hold up treatment, but did not want to wait to talk until she was seen on Wednesday. Please advise on referral?   Benjamine Mola

## 2020-06-29 ENCOUNTER — Inpatient Hospital Stay (HOSPITAL_BASED_OUTPATIENT_CLINIC_OR_DEPARTMENT_OTHER): Payer: Medicare Other | Admitting: Hospice and Palliative Medicine

## 2020-06-29 ENCOUNTER — Inpatient Hospital Stay: Payer: Medicare Other

## 2020-06-29 ENCOUNTER — Encounter: Payer: Self-pay | Admitting: Pharmacist

## 2020-06-29 ENCOUNTER — Encounter: Payer: Self-pay | Admitting: Oncology

## 2020-06-29 ENCOUNTER — Inpatient Hospital Stay (HOSPITAL_BASED_OUTPATIENT_CLINIC_OR_DEPARTMENT_OTHER): Payer: Medicare Other | Admitting: Oncology

## 2020-06-29 VITALS — BP 128/81 | HR 82 | Temp 98.1°F | Resp 18 | Wt 159.8 lb

## 2020-06-29 VITALS — BP 139/77 | HR 77 | Temp 97.9°F | Resp 18

## 2020-06-29 DIAGNOSIS — Z515 Encounter for palliative care: Secondary | ICD-10-CM | POA: Diagnosis not present

## 2020-06-29 DIAGNOSIS — Z5112 Encounter for antineoplastic immunotherapy: Secondary | ICD-10-CM | POA: Diagnosis not present

## 2020-06-29 DIAGNOSIS — E859 Amyloidosis, unspecified: Secondary | ICD-10-CM

## 2020-06-29 DIAGNOSIS — Z5111 Encounter for antineoplastic chemotherapy: Secondary | ICD-10-CM

## 2020-06-29 DIAGNOSIS — R7401 Elevation of levels of liver transaminase levels: Secondary | ICD-10-CM | POA: Diagnosis not present

## 2020-06-29 DIAGNOSIS — E8581 Light chain (AL) amyloidosis: Secondary | ICD-10-CM | POA: Diagnosis not present

## 2020-06-29 DIAGNOSIS — Z7189 Other specified counseling: Secondary | ICD-10-CM | POA: Diagnosis not present

## 2020-06-29 LAB — COMPREHENSIVE METABOLIC PANEL
ALT: 36 U/L (ref 0–44)
AST: 75 U/L — ABNORMAL HIGH (ref 15–41)
Albumin: 2.1 g/dL — ABNORMAL LOW (ref 3.5–5.0)
Alkaline Phosphatase: 941 U/L — ABNORMAL HIGH (ref 38–126)
Anion gap: 8 (ref 5–15)
BUN: 18 mg/dL (ref 8–23)
CO2: 22 mmol/L (ref 22–32)
Calcium: 8.6 mg/dL — ABNORMAL LOW (ref 8.9–10.3)
Chloride: 106 mmol/L (ref 98–111)
Creatinine, Ser: 1 mg/dL (ref 0.44–1.00)
GFR, Estimated: 58 mL/min — ABNORMAL LOW (ref >60)
Glucose, Bld: 194 mg/dL — ABNORMAL HIGH (ref 70–99)
Potassium: 3.3 mmol/L — ABNORMAL LOW (ref 3.5–5.1)
Sodium: 136 mmol/L (ref 135–145)
Total Bilirubin: 1.7 mg/dL — ABNORMAL HIGH (ref 0.3–1.2)
Total Protein: 6.1 g/dL — ABNORMAL LOW (ref 6.5–8.1)

## 2020-06-29 LAB — CBC WITH DIFFERENTIAL/PLATELET
Abs Immature Granulocytes: 0.04 10*3/uL (ref 0.00–0.07)
Basophils Absolute: 0.1 10*3/uL (ref 0.0–0.1)
Basophils Relative: 1 %
Eosinophils Absolute: 0.1 10*3/uL (ref 0.0–0.5)
Eosinophils Relative: 2 %
HCT: 33.9 % — ABNORMAL LOW (ref 36.0–46.0)
Hemoglobin: 11.7 g/dL — ABNORMAL LOW (ref 12.0–15.0)
Immature Granulocytes: 1 %
Lymphocytes Relative: 29 %
Lymphs Abs: 1.9 10*3/uL (ref 0.7–4.0)
MCH: 25.3 pg — ABNORMAL LOW (ref 26.0–34.0)
MCHC: 34.5 g/dL (ref 30.0–36.0)
MCV: 73.2 fL — ABNORMAL LOW (ref 80.0–100.0)
Monocytes Absolute: 0.7 10*3/uL (ref 0.1–1.0)
Monocytes Relative: 10 %
Neutro Abs: 3.9 10*3/uL (ref 1.7–7.7)
Neutrophils Relative %: 57 %
Platelets: 421 10*3/uL — ABNORMAL HIGH (ref 150–400)
RBC: 4.63 MIL/uL (ref 3.87–5.11)
RDW: 21.8 % — ABNORMAL HIGH (ref 11.5–15.5)
WBC: 6.7 10*3/uL (ref 4.0–10.5)
nRBC: 1.2 % — ABNORMAL HIGH (ref 0.0–0.2)

## 2020-06-29 MED ORDER — BORTEZOMIB CHEMO SQ INJECTION 3.5 MG (2.5MG/ML)
0.7000 mg/m2 | Freq: Once | INTRAMUSCULAR | Status: AC
  Administered 2020-06-29: 1.25 mg via SUBCUTANEOUS
  Filled 2020-06-29: qty 0.5

## 2020-06-29 MED ORDER — DIPHENHYDRAMINE HCL 25 MG PO CAPS
50.0000 mg | ORAL_CAPSULE | Freq: Once | ORAL | Status: AC
  Administered 2020-06-29: 50 mg via ORAL
  Filled 2020-06-29: qty 2

## 2020-06-29 MED ORDER — MONTELUKAST SODIUM 10 MG PO TABS
10.0000 mg | ORAL_TABLET | Freq: Once | ORAL | Status: AC
  Administered 2020-06-29: 10 mg via ORAL
  Filled 2020-06-29: qty 1

## 2020-06-29 MED ORDER — POTASSIUM CHLORIDE CRYS ER 20 MEQ PO TBCR: 20.0000 meq | EXTENDED_RELEASE_TABLET | Freq: Every day | ORAL | 0 refills | Status: DC

## 2020-06-29 MED ORDER — DARATUMUMAB-HYALURONIDASE-FIHJ 1800-30000 MG-UT/15ML ~~LOC~~ SOLN
1800.0000 mg | Freq: Once | SUBCUTANEOUS | Status: AC
  Administered 2020-06-29: 1800 mg via SUBCUTANEOUS
  Filled 2020-06-29: qty 15

## 2020-06-29 MED ORDER — DEXAMETHASONE 4 MG PO TABS
40.0000 mg | ORAL_TABLET | Freq: Once | ORAL | Status: AC
  Administered 2020-06-29: 40 mg via ORAL
  Filled 2020-06-29: qty 10

## 2020-06-29 MED ORDER — ACETAMINOPHEN 325 MG PO TABS
650.0000 mg | ORAL_TABLET | Freq: Once | ORAL | Status: AC
Start: 2020-06-29 — End: 2020-06-29
  Administered 2020-06-29: 650 mg via ORAL
  Filled 2020-06-29: qty 2

## 2020-06-29 NOTE — Progress Notes (Signed)
[facility]  [phone] [phone]  Patient Care Team: Leonel Ramsay, MD as PCP - General (Infectious Diseases)   Name of the patient: Nancy Rodriguez  [id number]  12/09/1945   Date of visit: 06/29/20  HPI: Patient is a 75 y.o. female with newly diagnosed amyloidosis. Planned treatment with cyclophosphamide (PO), bortezomib, daratumumab, and dexamethasone.  Reason for Consult: Cyclophosphamide first dose   PAST MEDICAL HISTORY: Past Medical History:  Diagnosis Date   Anemia    Anemia in chronic kidney disease   Chronic kidney disease    Stage 3b chronic kidney disease   Diabetes mellitus without complication (HCC)    Hypercholesterolemia    Hypertension    MGUS (monoclonal gammopathy of unknown significance)    Osteoarthritis    Rheumatoid arthritis ([place])     PAST SURGICAL HISTORY:  Past Surgical History:  Procedure Laterality Date   HERNIA REPAIR     PARATHYROIDECTOMY      HEMATOLOGY/ONCOLOGY HISTORY:  Oncology History   No history exists.    ALLERGIES:  is allergic to benazepril, lisinopril, tolmetin, and nsaids.  MEDICATIONS:  Current Outpatient Medications  Medication Sig Dispense Refill   acyclovir (ZOVIRAX) 400 MG tablet Take 1 tablet (400 mg total) by mouth 2 (two) times daily. 60 tablet 5   albuterol (VENTOLIN HFA) 108 (90 Base) MCG/ACT inhaler Inhale 2 puffs into the lungs every 4 (four) hours as needed.     amLODipine (NORVASC) 5 MG tablet Take by mouth.     aspirin EC 81 MG tablet Take 81 mg by mouth daily. (Patient not taking: No sig reported)     canagliflozin (INVOKANA) 100 MG TABS tablet Take by mouth. (Patient not taking: No sig reported)     cyclophosphamide (CYTOXAN) 50 MG capsule Take 10 capsules (500 mg total) by mouth once a week. Take with breakfast. 40 capsule 3   docusate sodium (COLACE) 100 MG capsule Take 1 capsule by mouth as needed.      EPINEPHrine 0.3 mg/0.3 mL IJ SOAJ injection as needed (Patient not taking: No sig reported)     ferrous sulfate 325 (65 FE) MG EC tablet TAKE 1 TABLET BY MOUTH EVERY DAY (Patient not taking: No sig reported) 90 tablet 1   glipiZIDE (GLUCOTROL XL) 10 MG 24 hr tablet Take by mouth. 1 QAM 1 QPM (Patient not taking: No sig reported)     insulin glargine (LANTUS SOLOSTAR) 100 UNIT/ML Solostar Pen Inject 10 Units into the skin at bedtime.     metoprolol succinate (TOPROL-XL) 50 MG 24 hr tablet TAKE 1 TABLET (50 MG TOTAL) BY MOUTH DAILY IN THE MORNING     mirtazapine (REMERON) 7.5 MG tablet Take 1 tablet (7.5 mg total) by mouth at bedtime. 30 tablet 2   ondansetron (ZOFRAN) 8 MG tablet Take 8 mg by mouth 30 to 60 min prior to Cytoxan administration then take 8 mg twice daily as needed for nausea and vomiting. (Patient not taking: No sig reported) 30 tablet 1   potassium chloride SA (KLOR-CON) 20 MEQ tablet Take 1 tablet (20 mEq total) by mouth daily. 3 tablet 0   prednisoLONE acetate (PRED FORTE) 1 % ophthalmic suspension Place 1 drop into both eyes daily.      prochlorperazine (COMPAZINE) 10 MG tablet Take 1 tablet (10 mg total) by mouth every 6 (six) hours as needed (Nausea or vomiting). (Patient not taking: No sig reported) 30 tablet 1   rosuvastatin (CRESTOR) 20 MG  tablet Take 20 mg by mouth daily.  (Patient not taking: No sig reported)     sulfamethoxazole-trimethoprim (BACTRIM DS) 800-160 MG tablet Take 1 tablet by mouth 2 (two) times daily. (Patient not taking: No sig reported)     traMADol (ULTRAM) 50 MG tablet Take 1 tablet (50 mg total) by mouth every 6 (six) hours as needed for severe pain. (Patient not taking: No sig reported) 8 tablet 0   No current facility-administered medications for this visit.    VITAL SIGNS: There were no vitals taken for this visit. There were no vitals filed for this visit.  Estimated body mass index is 27.86 kg/m as calculated from the following:    Height as of 06/09/20: 5' 3.5" (1.613 m).   Weight as of an earlier encounter on 06/29/20: 72.5 kg (159 lb 12.8 oz).  LABS: CBC:    Component Value Date/Time   WBC 6.7 06/29/2020 0830   HGB 11.7 (L) 06/29/2020 0830   HCT 33.9 (L) 06/29/2020 0830   PLT 421 (H) 06/29/2020 0830   MCV 73.2 (L) 06/29/2020 0830   NEUTROABS 3.9 06/29/2020 0830   LYMPHSABS 1.9 06/29/2020 0830   MONOABS 0.7 06/29/2020 0830   EOSABS 0.1 06/29/2020 0830   BASOSABS 0.1 06/29/2020 0830   Comprehensive Metabolic Panel:    Component Value Date/Time   NA 136 06/29/2020 0830   K 3.3 (L) 06/29/2020 0830   CL 106 06/29/2020 0830   CO2 22 06/29/2020 0830   BUN 18 06/29/2020 0830   CREATININE 1.00 06/29/2020 0830   GLUCOSE 194 (H) 06/29/2020 0830   CALCIUM 8.6 (L) 06/29/2020 0830   AST 75 (H) 06/29/2020 0830   ALT 36 06/29/2020 0830   ALKPHOS 941 (H) 06/29/2020 0830   BILITOT 1.7 (H) 06/29/2020 0830   PROT 6.1 (L) 06/29/2020 0830   ALBUMIN 2.1 (L) 06/29/2020 0830    RADIOGRAPHIC STUDIES: CT BONE MARROW BIOPSY & ASPIRATION  Result Date: 06/09/2020 INDICATION: 75 year old female with a history of monoclonal gammopathy referred for bone marrow biopsy EXAM: CT BONE MARROW BIOPSY AND ASPIRATION MEDICATIONS: None. ANESTHESIA/SEDATION: Moderate (conscious) sedation was employed during this procedure. A total of Versed 1.0 mg and Fentanyl 50 mcg was administered intravenously. Moderate Sedation Time: 10 minutes. The patient's level of consciousness and vital signs were monitored continuously by radiology nursing throughout the procedure under my direct supervision. FLUOROSCOPY TIME:  CT COMPLICATIONS: None PROCEDURE: The procedure risks, benefits, and alternatives were explained to the patient. Questions regarding the procedure were encouraged and answered. The patient understands and consents to the procedure. Scout CT of the pelvis was performed for surgical planning purposes. The right posterior pelvis was prepped with  Chlorhexidine in a sterile fashion, and a sterile drape was applied covering the operative field. A sterile gown and sterile gloves were used for the procedure. Local anesthesia was provided with 1% Lidocaine. Right posterior iliac bone was targeted for biopsy. The skin and subcutaneous tissues were infiltrated with 1% lidocaine without epinephrine. A small stab incision was made with an 11 blade scalpel, and an 11 gauge Murphy needle was advanced with CT guidance to the posterior cortex. Manual forced was used to advance the needle through the posterior cortex and the stylet was removed. A bone marrow aspirate was retrieved and passed to a cytotechnologist in the room. The Murphy needle was then advanced without the stylet for a core biopsy. The core biopsy was retrieved and also passed to a cytotechnologist. Manual pressure was used for hemostasis and  a sterile dressing was placed. No complications were encountered no significant blood loss was encountered. Patient tolerated the procedure well and remained hemodynamically stable throughout. IMPRESSION: Status post CT-guided bone marrow biopsy, with tissue specimen sent to pathology for complete histopathologic analysis Signed, Dulcy Fanny. Earleen Newport, DO [facility] Electronically Signed   By: Corrie Mckusick D.O.   On: 06/09/2020 09:58   ECHOCARDIOGRAM LIMITED  Result Date: 06/01/2020    ECHOCARDIOGRAM LIMITED REPORT   Patient Name:   Nancy Rodriguez Date of Exam: 06/01/2020 Medical Rec #:  [id number]         Height:       63.5 in Accession #:    [id number]        Weight:       146.5 lb Date of Birth:  08-28-1945          BSA:          1.704 m Patient Age:    8 years          BP:           143/83 mmHg Patient Gender: F                 HR:           84 bpm. Exam Location:  [facility] Procedure: 2D Echo, Cardiac Doppler, Color Doppler and Strain Analysis Indications:     Amyloidosis- unspecified type  History:          Patient has no prior history of Echocardiogram examinations.  Sonographer:     Sherrie Sport RDCS (AE) Referring Phys:  [id number] ZHOU YU Diagnosing Phys: Kate Sable MD  Sonographer Comments: Global longitudinal strain was attempted. IMPRESSIONS  1. Left ventricular ejection fraction, by estimation, is 60 to 65%. The left ventricle has normal function. There is mild asymmetric left ventricular hypertrophy of the basal-septal segment. Left ventricular diastolic parameters are indeterminate. The average left ventricular global longitudinal strain is -16.8 %.  2. Right ventricular systolic function is normal.  3. The mitral valve is normal in structure. Mild mitral valve regurgitation.  4. The aortic valve is tricuspid. Aortic valve regurgitation is not visualized. Mild aortic valve sclerosis is present, with no evidence of aortic valve stenosis.  5. The inferior vena cava is normal in size with greater than 50% respiratory variability, suggesting right atrial pressure of 3 mmHg. Conclusion(s)/Recommendation(s): No echocardiographic findings to suggest cardiac amyloid on this study. FINDINGS  Left Ventricle: Left ventricular ejection fraction, by estimation, is 60 to 65%. The left ventricle has normal function. The average left ventricular global longitudinal strain is -16.8 %. The left ventricular internal cavity size was normal in size. There is mild asymmetric left ventricular hypertrophy of the basal-septal segment. Left ventricular diastolic parameters are indeterminate. Right Ventricle: No increase in right ventricular wall thickness. Right ventricular systolic function is normal. Left Atrium: Left atrial size was normal in size. Right Atrium: Right atrial size was normal in size. Pericardium: There is no evidence of pericardial effusion. Mitral Valve: The mitral valve is normal in structure. Mild mitral valve regurgitation. Tricuspid Valve: The tricuspid valve is normal in structure. Tricuspid valve  regurgitation is not demonstrated. Aortic Valve: The aortic valve is tricuspid. Aortic valve regurgitation is not visualized. Mild aortic valve sclerosis is present, with no evidence of aortic valve stenosis. Aortic valve mean gradient measures 5.0 mmHg. Aortic valve peak gradient measures 8.2 mmHg. Aortic valve area, by VTI measures 1.58 cm. Pulmonic Valve:  The pulmonic valve was normal in structure. Pulmonic valve regurgitation is trivial. Aorta: The aortic root is normal in size and structure. Venous: The inferior vena cava is normal in size with greater than 50% respiratory variability, suggesting right atrial pressure of 3 mmHg. LEFT VENTRICLE PLAX 2D LVIDd:         4.28 cm  Diastology LVIDs:         2.44 cm  LV e' medial:    5.66 cm/s LV PW:         1.72 cm  LV E/e' medial:  13.6 LV IVS:        1.01 cm  LV e' lateral:   5.11 cm/s LVOT diam:     2.00 cm  LV E/e' lateral: 15.1 LV SV:         49 LV SV Index:   29       2D Longitudinal Strain LVOT Area:     3.14 cm 2D Strain GLS Avg:     -16.8 %                          3D Volume EF:                         3D EF:        62 %                         LV EDV:       115 ml                         LV ESV:       44 ml                         LV SV:        71 ml RIGHT VENTRICLE RV Basal diam:  3.11 cm RV S prime:     10.10 cm/s TAPSE (M-mode): 3.1 cm LEFT ATRIUM             Index       RIGHT ATRIUM           Index LA diam:        3.80 cm 2.23 cm/m  RA Area:     13.60 cm LA Vol (A2C):   47.7 ml 27.99 ml/m RA Volume:   32.60 ml  19.13 ml/m LA Vol (A4C):   46.4 ml 27.23 ml/m LA Biplane Vol: 49.3 ml 28.93 ml/m  AORTIC VALVE                    PULMONIC VALVE AV Area (Vmax):    1.62 cm     PV Vmax:        0.65 m/s AV Area (Vmean):   1.56 cm     PV Peak grad:   1.7 mmHg AV Area (VTI):     1.58 cm     RVOT Peak grad: 2 mmHg AV Vmax:           143.00 cm/s AV Vmean:          104.667 cm/s AV VTI:            0.309 m AV Peak Grad:      8.2 mmHg AV Mean Grad:      5.0 mmHg  LVOT Vmax:  73.80 cm/s LVOT Vmean:        52.000 cm/s LVOT VTI:          0.156 m LVOT/AV VTI ratio: 0.50  AORTA Ao Root diam: 3.10 cm MITRAL VALVE               TRICUSPID VALVE MV Area (PHT): 4.54 cm    TR Peak grad:   11.2 mmHg MV Decel Time: 167 msec    TR Vmax:        167.00 cm/s MV E velocity: 77.10 cm/s MV A velocity: 72.00 cm/s  SHUNTS MV E/A ratio:  1.07        Systemic VTI:  0.16 m                            Systemic Diam: 2.00 cm Kate Sable MD Electronically signed by Kate Sable MD Signature Date/Time: 06/01/2020/4:06:38 PM    Final      Assessment and Plan-  Today is C1D1. Patient brought all of her supportive medication and her cyclophosphamide with her to clinic. For this first week, she will take her dose in clinic. She understands that her following weekly dose of cyclophosphamide can be taken at home.  She took her home ondansetron at 10:02 am today following her appt with Dr. Tasia Catchings on the MD side of clinic. I later met with her in infusion and sat with her as she took her cyclophosphamide (home supply) dose, 10 capsules ($RemoveBefo'500mg'[id number]$ ) at 1045 am.   Patient Education (re-education) Reviewed patient calendar with patient and her son. Cyclophosphamide handout provided to her son.  Other:  - Mrs. Leeson reported some night time itchy that recently started. She has had a increase in her T.bili, likely due to disease location. Will continue to monitor. Hopeful the itching will decrease with treatment. She reports using hydrocortisone cream at home.  - Dr. Tasia Catchings also plans on having the Mrs. Mcenery take potassium at home for the next 3 days due to her potassium level. Patient was notified of this during our visit.   Mrs. Corsino and her son voiced understanding/appreciation. All questions answered. Medication handout provided.   Provided patient with Oral [facility] phone number. Patient knows to call the office with questions or concerns. Oral [facility] will continue to follow.  Patient expressed understanding and was in agreement with this plan. She also understands that She can call clinic at any time with any questions, concerns, or complaints.   Thank you for allowing me to participate in the care of this very pleasant patient.   Time Total: 20 mins  Visit consisted of counseling and education on dealing with issues of symptom management in the setting of serious and potentially life-threatening illness.Greater than 50%  of this time was spent counseling and coordinating care related to the above assessment and plan.  Signed by: Darl Pikes, PharmD, BCPS, Salley Slaughter, CPP Hematology/Oncology Clinical Pharmacist Practitioner [facility] [phone]  06/29/2020 1:22 PM

## 2020-06-29 NOTE — Progress Notes (Signed)
Patient started Acyclovir 3 days ago and has noticed bilateral leg edema, 9 lb wt gain since last documented weight.

## 2020-06-29 NOTE — Progress Notes (Signed)
   Palliative Medicine [facility]  [phone] [phone]   Name: Nancy Rodriguez Date: 06/29/2020 MRN: [id number]  DOB: 05/25/1945  Patient Care Team: Fitzgerald, David P, MD as PCP - General (Infectious Diseases)    REASON FOR CONSULTATION: Nancy Rodriguez is a 75 y.o. female with multiple medical problems including monoclonal gammopathy, CKD stage 3b, anemia, RA, diabetes, who was recently diagnosed with amyloidosis.  Option for bone marrow biopsy was discussed but patient declined.  She was started on systemic chemotherapy.  Patient was referred to palliative care to help address goals and manage ongoing symptoms.  SOCIAL HISTORY:     reports that she has never smoked. She has never used smokeless tobacco. She reports that she does not drink alcohol and does not use drugs.  Patient is widowed.  She currently lives at home with her daughter.  In total, patient has 4 sons and 2 daughters.  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  ADVANCE DIRECTIVES:  Does not have  CODE STATUS:   PAST MEDICAL HISTORY: Past Medical History:  Diagnosis Date  . Anemia    Anemia in chronic kidney disease  . Chronic kidney disease    Stage 3b chronic kidney disease  . Diabetes mellitus without complication (HCC)   . Hypercholesterolemia   . Hypertension   . MGUS (monoclonal gammopathy of unknown significance)   . Osteoarthritis   . Rheumatoid arthritis (HCC)     PAST SURGICAL HISTORY:  Past Surgical History:  Procedure Laterality Date  . HERNIA REPAIR    . PARATHYROIDECTOMY      HEMATOLOGY/ONCOLOGY HISTORY:  Oncology History   No history exists.    ALLERGIES:  is allergic to benazepril, lisinopril, tolmetin, and nsaids.  MEDICATIONS:  Current Outpatient Medications  Medication Sig Dispense Refill  . acyclovir (ZOVIRAX) 400 MG tablet Take 1 tablet (400 mg total) by mouth 2 (two) times daily.  60 tablet 5  . albuterol (VENTOLIN HFA) 108 (90 Base) MCG/ACT inhaler Inhale 2 puffs into the lungs every 4 (four) hours as needed.    . amLODipine (NORVASC) 5 MG tablet Take by mouth.    . aspirin EC 81 MG tablet Take 81 mg by mouth daily. (Patient not taking: No sig reported)    . canagliflozin (INVOKANA) 100 MG TABS tablet Take by mouth. (Patient not taking: No sig reported)    . cyclophosphamide (CYTOXAN) 50 MG capsule Take 10 capsules (500 mg total) by mouth once a week. Take with breakfast. 40 capsule 3  . docusate sodium (COLACE) 100 MG capsule Take 1 capsule by mouth as needed.    . EPINEPHrine 0.3 mg/0.3 mL IJ SOAJ injection as needed (Patient not taking: No sig reported)    . ferrous sulfate 325 (65 FE) MG EC tablet TAKE 1 TABLET BY MOUTH EVERY DAY (Patient not taking: No sig reported) 90 tablet 1  . glipiZIDE (GLUCOTROL XL) 10 MG 24 hr tablet Take by mouth. 1 QAM 1 QPM (Patient not taking: No sig reported)    . insulin glargine (LANTUS SOLOSTAR) 100 UNIT/ML Solostar Pen Inject 10 Units into the skin at bedtime.    . metoprolol succinate (TOPROL-XL) 50 MG 24 hr tablet TAKE 1 TABLET (50 MG TOTAL) BY MOUTH DAILY IN THE MORNING    . mirtazapine (REMERON) 7.5 MG tablet Take 1 tablet (7.5 mg total) by mouth at bedtime. 30 tablet 2  . ondansetron (ZOFRAN) 8 MG tablet   Take 8 mg by mouth 30 to 60 min prior to Cytoxan administration then take 8 mg twice daily as needed for nausea and vomiting. (Patient not taking: No sig reported) 30 tablet 1  . potassium chloride SA (KLOR-CON) 20 MEQ tablet Take 1 tablet (20 mEq total) by mouth daily. 3 tablet 0  . prednisoLONE acetate (PRED FORTE) 1 % ophthalmic suspension Place 1 drop into both eyes daily.     . prochlorperazine (COMPAZINE) 10 MG tablet Take 1 tablet (10 mg total) by mouth every 6 (six) hours as needed (Nausea or vomiting). (Patient not taking: No sig reported) 30 tablet 1  . rosuvastatin (CRESTOR) 20 MG tablet Take 20 mg by mouth daily.   (Patient not taking: No sig reported)    . sulfamethoxazole-trimethoprim (BACTRIM DS) 800-160 MG tablet Take 1 tablet by mouth 2 (two) times daily. (Patient not taking: No sig reported)    . traMADol (ULTRAM) 50 MG tablet Take 1 tablet (50 mg total) by mouth every 6 (six) hours as needed for severe pain. (Patient not taking: No sig reported) 8 tablet 0   No current facility-administered medications for this visit.    VITAL SIGNS: There were no vitals taken for this visit. There were no vitals filed for this visit.  Estimated body mass index is 27.86 kg/m as calculated from the following:   Height as of 06/09/20: 5' 3.5" (1.613 m).   Weight as of an earlier encounter on 06/29/20: 159 lb 12.8 oz (72.5 kg).  LABS: CBC:    Component Value Date/Time   WBC 6.7 06/29/2020 0830   HGB 11.7 (L) 06/29/2020 0830   HCT 33.9 (L) 06/29/2020 0830   PLT 421 (H) 06/29/2020 0830   MCV 73.2 (L) 06/29/2020 0830   NEUTROABS 3.9 06/29/2020 0830   LYMPHSABS 1.9 06/29/2020 0830   MONOABS 0.7 06/29/2020 0830   EOSABS 0.1 06/29/2020 0830   BASOSABS 0.1 06/29/2020 0830   Comprehensive Metabolic Panel:    Component Value Date/Time   NA 136 06/29/2020 0830   K 3.3 (L) 06/29/2020 0830   CL 106 06/29/2020 0830   CO2 22 06/29/2020 0830   BUN 18 06/29/2020 0830   CREATININE 1.00 06/29/2020 0830   GLUCOSE 194 (H) 06/29/2020 0830   CALCIUM 8.6 (L) 06/29/2020 0830   AST 75 (H) 06/29/2020 0830   ALT 36 06/29/2020 0830   ALKPHOS 941 (H) 06/29/2020 0830   BILITOT 1.7 (H) 06/29/2020 0830   PROT 6.1 (L) 06/29/2020 0830   ALBUMIN 2.1 (L) 06/29/2020 0830    RADIOGRAPHIC STUDIES: CT BONE MARROW BIOPSY & ASPIRATION  Result Date: 06/09/2020 INDICATION: 74-year-old female with a history of monoclonal gammopathy referred for bone marrow biopsy EXAM: CT BONE MARROW BIOPSY AND ASPIRATION MEDICATIONS: None. ANESTHESIA/SEDATION: Moderate (conscious) sedation was employed during this procedure. A total of Versed 1.0 mg  and Fentanyl 50 mcg was administered intravenously. Moderate Sedation Time: 10 minutes. The patient's level of consciousness and vital signs were monitored continuously by radiology nursing throughout the procedure under my direct supervision. FLUOROSCOPY TIME:  CT COMPLICATIONS: None PROCEDURE: The procedure risks, benefits, and alternatives were explained to the patient. Questions regarding the procedure were encouraged and answered. The patient understands and consents to the procedure. Scout CT of the pelvis was performed for surgical planning purposes. The right posterior pelvis was prepped with Chlorhexidine in a sterile fashion, and a sterile drape was applied covering the operative field. A sterile gown and sterile gloves were used for the procedure. Local   anesthesia was provided with 1% Lidocaine. Right posterior iliac bone was targeted for biopsy. The skin and subcutaneous tissues were infiltrated with 1% lidocaine without epinephrine. A small stab incision was made with an 11 blade scalpel, and an 11 gauge Murphy needle was advanced with CT guidance to the posterior cortex. Manual forced was used to advance the needle through the posterior cortex and the stylet was removed. A bone marrow aspirate was retrieved and passed to a cytotechnologist in the room. The Murphy needle was then advanced without the stylet for a core biopsy. The core biopsy was retrieved and also passed to a cytotechnologist. Manual pressure was used for hemostasis and a sterile dressing was placed. No complications were encountered no significant blood loss was encountered. Patient tolerated the procedure well and remained hemodynamically stable throughout. IMPRESSION: Status post CT-guided bone marrow biopsy, with tissue specimen sent to pathology for complete histopathologic analysis Signed, Jaime S. Wagner, DO [facility] Electronically Signed   By: Jaime  Wagner D.O.   On:  06/09/2020 09:58   ECHOCARDIOGRAM LIMITED  Result Date: 06/01/2020    ECHOCARDIOGRAM LIMITED REPORT   Patient Name:   Whitleigh J Berlanga Date of Exam: 06/01/2020 Medical Rec #:  [id number]         Height:       63.5 in Accession #:    [id number]        Weight:       146.5 lb Date of Birth:  03/10/1946          BSA:          1.704 m Patient Age:    74 years          BP:           143/83 mmHg Patient Gender: F                 HR:           84 bpm. Exam Location:  [facility] Procedure: 2D Echo, Cardiac Doppler, Color Doppler and Strain Analysis Indications:     Amyloidosis- unspecified type  History:         Patient has no prior history of Echocardiogram examinations.  Sonographer:     Jerry Hege RDCS (AE) Referring Phys:  [id number] ZHOU YU Diagnosing Phys: Brian Agbor-Etang MD  Sonographer Comments: Global longitudinal strain was attempted. IMPRESSIONS  1. Left ventricular ejection fraction, by estimation, is 60 to 65%. The left ventricle has normal function. There is mild asymmetric left ventricular hypertrophy of the basal-septal segment. Left ventricular diastolic parameters are indeterminate. The average left ventricular global longitudinal strain is -16.8 %.  2. Right ventricular systolic function is normal.  3. The mitral valve is normal in structure. Mild mitral valve regurgitation.  4. The aortic valve is tricuspid. Aortic valve regurgitation is not visualized. Mild aortic valve sclerosis is present, with no evidence of aortic valve stenosis.  5. The inferior vena cava is normal in size with greater than 50% respiratory variability, suggesting right atrial pressure of 3 mmHg. Conclusion(s)/Recommendation(s): No echocardiographic findings to suggest cardiac amyloid on this study. FINDINGS  Left Ventricle: Left ventricular ejection fraction, by estimation, is 60 to 65%. The left ventricle has normal function. The average left ventricular global longitudinal strain is -16.8 %. The left ventricular internal cavity size  was normal in size. There is mild asymmetric left ventricular hypertrophy of the basal-septal segment. Left ventricular diastolic parameters are indeterminate. Right Ventricle: No increase in   right ventricular wall thickness. Right ventricular systolic function is normal. Left Atrium: Left atrial size was normal in size. Right Atrium: Right atrial size was normal in size. Pericardium: There is no evidence of pericardial effusion. Mitral Valve: The mitral valve is normal in structure. Mild mitral valve regurgitation. Tricuspid Valve: The tricuspid valve is normal in structure. Tricuspid valve regurgitation is not demonstrated. Aortic Valve: The aortic valve is tricuspid. Aortic valve regurgitation is not visualized. Mild aortic valve sclerosis is present, with no evidence of aortic valve stenosis. Aortic valve mean gradient measures 5.0 mmHg. Aortic valve peak gradient measures 8.2 mmHg. Aortic valve area, by VTI measures 1.58 cm. Pulmonic Valve: The pulmonic valve was normal in structure. Pulmonic valve regurgitation is trivial. Aorta: The aortic root is normal in size and structure. Venous: The inferior vena cava is normal in size with greater than 50% respiratory variability, suggesting right atrial pressure of 3 mmHg. LEFT VENTRICLE PLAX 2D LVIDd:         4.28 cm  Diastology LVIDs:         2.44 cm  LV e' medial:    5.66 cm/s LV PW:         1.72 cm  LV E/e' medial:  13.6 LV IVS:        1.01 cm  LV e' lateral:   5.11 cm/s LVOT diam:     2.00 cm  LV E/e' lateral: 15.1 LV SV:         49 LV SV Index:   29       2D Longitudinal Strain LVOT Area:     3.14 cm 2D Strain GLS Avg:     -16.8 %                          3D Volume EF:                         3D EF:        62 %                         LV EDV:       115 ml                         LV ESV:       44 ml                         LV SV:        71 ml RIGHT VENTRICLE RV Basal diam:  3.11 cm RV S prime:     10.10 cm/s TAPSE (M-mode): 3.1 cm LEFT ATRIUM             Index        RIGHT ATRIUM           Index LA diam:        3.80 cm 2.23 cm/m  RA Area:     13.60 cm LA Vol (A2C):   47.7 ml 27.99 ml/m RA Volume:   32.60 ml  19.13 ml/m LA Vol (A4C):   46.4 ml 27.23 ml/m LA Biplane Vol: 49.3 ml 28.93 ml/m  AORTIC VALVE                    PULMONIC VALVE AV Area (Vmax):    1.62 cm       PV Vmax:        0.65 m/s AV Area (Vmean):   1.56 cm     PV Peak grad:   1.7 mmHg AV Area (VTI):     1.58 cm     RVOT Peak grad: 2 mmHg AV Vmax:           143.00 cm/s AV Vmean:          104.667 cm/s AV VTI:            0.309 m AV Peak Grad:      8.2 mmHg AV Mean Grad:      5.0 mmHg LVOT Vmax:         73.80 cm/s LVOT Vmean:        52.000 cm/s LVOT VTI:          0.156 m LVOT/AV VTI ratio: 0.50  AORTA Ao Root diam: 3.10 cm MITRAL VALVE               TRICUSPID VALVE MV Area (PHT): 4.54 cm    TR Peak grad:   11.2 mmHg MV Decel Time: 167 msec    TR Vmax:        167.00 cm/s MV E velocity: 77.10 cm/s MV A velocity: 72.00 cm/s  SHUNTS MV E/A ratio:  1.07        Systemic VTI:  0.16 m                            Systemic Diam: 2.00 cm Kate Sable MD Electronically signed by Kate Sable MD Signature Date/Time: 06/01/2020/4:06:38 PM    Final     PERFORMANCE STATUS (ECOG) : 1 - Symptomatic but completely ambulatory  Review of Systems Unless otherwise noted, a complete review of systems is negative.  Physical Exam General: NAD Pulmonary: Unlabored Extremities: no edema, no joint deformities Skin: no rashes Neurological: Weakness but otherwise nonfocal  IMPRESSION: Routine follow-up visit.  Patient seen in infusion area.  Patient denies any significant changes or concerns today.  No symptomatic complaints at present.  She continues to endorse poor sleep and anxiety.  She says that she lost the mirtazapine after picking it up from the pharmacy but has subsequently found it and plans to start taking it soon.  PLAN: -Continue current scope of treatment -Start mirtazapine 7.5 mg  nightly -ACP/MOST form previously reviewed -RTC next week   Patient expressed understanding and was in agreement with this plan. She also understands that She can call the clinic at any time with any questions, concerns, or complaints.     Time Total: 15 minutes  Visit consisted of counseling and education dealing with the complex and emotionally intense issues of symptom management and palliative care in the setting of serious and potentially life-threatening illness.Greater than 50%  of this time was spent counseling and coordinating care related to the above assessment and plan.  Signed by: Altha Harm, PhD, NP-C

## 2020-06-29 NOTE — Progress Notes (Signed)
Bilirubin 1.7. Per Benjamine Mola RN per Dr. Tasia Catchings okay to proceed with treatment.    1335: Pt tolerated treatment well. No s/s of distress or reaction noted. Injection sites WNL, no swelling, redness or pain noted at injection sites. Pt and VS stable at discharge.

## 2020-06-29 NOTE — Progress Notes (Signed)
Hematology/Oncology  Follow up note [facility] [phone] [phone]   Patient Care Team: Leonel Ramsay, MD as PCP - General (Infectious Diseases)  REFERRING PROVIDER: Leonel Ramsay, MD  CHIEF COMPLAINTS/REASON FOR VISIT:  Follow-up for amyloidosis  HISTORY OF PRESENTING ILLNESS:   Nancy Rodriguez is a  75 y.o.  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  Work up include protein electrophoresis showed M protein 0.7,   Reviewed her previous medical records via care everywhere.  She was seen by Hematology Oncology at [facility] on 02/14/2017.  07/25/2007  SPEP showed M protein of 0.35, IFE showed IgG lamda 09/22/2007  Bone survey negative.  02/15/14 IgG 930, SPEP M protien 0.22, free kappa light chain 3.59, lamda 2.92, ratio 1.23  Hypercalcemia, resolved after stopping HCTZ.  # Transaminitis 03/28/20 US abdomen showed left liver lobe Complex 1.7 cm cyst  # 04/07/20 MRI abdomen w/wo contrast showed 2 benign liver cysts, 2 nonspecific hypovascular irighr liver lesions, abnormal bone marrow signal of L1 vertebral body. Patient was  advised to proceed with bone marrow biopsy and she declined.  # referred her to established care with GI and was seen on 04/19/20,  # Liver biopsy showed amyloidosis. [place] MS/MS showed AL type # 05/20/20 recommend bone marrow biopsy which is scheduled on 05/24/2020.  Patient changed her mind and ask a biopsy to be canceled.  My team and I had multiple phone discussion with patient's daughter Nancy Rodriguez and later with the patient's son Nancy Rodriguez.  Patient agreed with bone marrow biopsy and a biopsy was scheduled on 06/09/2020.  05/20/20 NT proBNP  was normal,  TSH slightly increased, normal T4 Normal factor X Normal coags Troponin 19. Refer to  cardiology for evaluation. May need cardiac MRI  # 06/01/2020 2D echo result was reviewed and discussed with patient.  Patient has normal LVEF 60-65%. Mild asymmetric left ventricular hypertrophy.  Left ventricular diastolic parameters are indeterminate. average left ventricular global longitudinal strain is -16.8 %.  # 06/09/2020, bone marrow biopsy showed monoclonal plasmacytosis, 8%, amyloid deposit present.  Absent iron stores. 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 t (14;20) #06/17/2020, further discussed about diagnosis and treatment plan.  Patient declined bone marrow transplant evaluation.  Decision was made to proceed with Dara-CyborD chemotherapy treatments.  INTERVAL HISTORY LEJLA Rodriguez is a 75 y.o. female who has above history reviewed by me today presents for follow up visit for management of AL amyloidosis Problems and complaints are listed below: Patient presents for the evaluation prior to Dara-CyborD treatment. Patient has started on acyclovir for shingle prophylaxis. She has noticed some left upper quadrant pain, 5 out of 10, intermittent.  She cannot think of anything that triggers the pain.  She attributes to possible bloating pain. She has also noticed bilateral lower extremity edema which is the worst at the end of the day and better after leg elevation or wearing compression stocking.  No calf tenderness, erythematous changes. She also reports feeling chest tightness.  Denies any chest pain, shortness of breath.  She has an appointment with cardiology later this week. Nighttime itchiness. Review of Systems  Constitutional: Negative for appetite change, chills, fatigue and fever.  HENT:   Negative for hearing loss, nosebleeds and voice change.  Eyes: Negative for eye problems.  Respiratory: Positive for chest tightness. Negative for cough.   Cardiovascular: Positive for leg swelling. Negative for chest pain.   Gastrointestinal: Positive for abdominal pain. Negative for abdominal distention and blood in stool.  Endocrine: Negative for hot flashes.  Genitourinary: Negative for difficulty urinating and frequency.   Musculoskeletal: Negative for arthralgias.  Skin: Positive for itching. Negative for rash.  Neurological: Negative for extremity weakness.  Hematological: Negative for adenopathy.  Psychiatric/Behavioral: Negative for confusion.    MEDICAL HISTORY:  Past Medical History:  Diagnosis Date  . Anemia    Anemia in chronic kidney disease  . Chronic kidney disease    Stage 3b chronic kidney disease  . Diabetes mellitus without complication ([place])   . Hypercholesterolemia   . Hypertension   . MGUS (monoclonal gammopathy of unknown significance)   . Osteoarthritis   . Rheumatoid arthritis ([place])     SURGICAL HISTORY: Past Surgical History:  Procedure Laterality Date  . HERNIA REPAIR    . PARATHYROIDECTOMY      SOCIAL HISTORY: Social History   Socioeconomic History  . Marital status: Widowed    Spouse name: Not on file  . Number of children: 6  . Years of education: Not on file  . Highest education level: Not on file  Occupational History  . Not on file  Tobacco Use  . Smoking status: Never Smoker  . Smokeless tobacco: Never Used  Substance and Sexual Activity  . Alcohol use: No  . Drug use: Never  . Sexual activity: Not on file  Other Topics Concern  . Not on file  Social History Narrative  . Not on file   Social Determinants of Health   Financial Resource Strain: Not on file  Food Insecurity: Not on file  Transportation Needs: Not on file  Physical Activity: Not on file  Stress: Not on file  Social Connections: Not on file  Intimate Partner Violence: Not on file    FAMILY HISTORY: Family History  Problem Relation Age of Onset  . Diabetes Daughter   . Diabetes Son     ALLERGIES:  is allergic to benazepril, lisinopril, tolmetin, and  nsaids.  MEDICATIONS:  Current Outpatient Medications  Medication Sig Dispense Refill  . acyclovir (ZOVIRAX) 400 MG tablet Take 1 tablet (400 mg total) by mouth 2 (two) times daily. 60 tablet 5  . albuterol (VENTOLIN HFA) 108 (90 Base) MCG/ACT inhaler Inhale 2 puffs into the lungs every 4 (four) hours as needed.    Marland Kitchen amLODipine (NORVASC) 5 MG tablet Take by mouth.    . cyclophosphamide (CYTOXAN) 50 MG capsule Take 10 capsules (500 mg total) by mouth once a week. Take with breakfast. 40 capsule 3  . docusate sodium (COLACE) 100 MG capsule Take 1 capsule by mouth as needed.    . insulin glargine (LANTUS SOLOSTAR) 100 UNIT/ML Solostar Pen Inject 10 Units into the skin at bedtime.    . metoprolol succinate (TOPROL-XL) 50 MG 24 hr tablet TAKE 1 TABLET (50 MG TOTAL) BY MOUTH DAILY IN THE MORNING    . mirtazapine (REMERON) 7.5 MG tablet Take 1 tablet (7.5 mg total) by mouth at bedtime. 30 tablet 2  . potassium chloride SA (KLOR-CON) 20 MEQ tablet Take 1 tablet (20 mEq total) by mouth daily. 3 tablet 0  . prednisoLONE acetate (PRED FORTE) 1 % ophthalmic suspension Place 1 drop into both eyes daily.     Marland Kitchen aspirin EC 81 MG tablet Take 81 mg by  mouth daily. (Patient not taking: No sig reported)    . canagliflozin (INVOKANA) 100 MG TABS tablet Take by mouth. (Patient not taking: No sig reported)    . EPINEPHrine 0.3 mg/0.3 mL IJ SOAJ injection as needed (Patient not taking: No sig reported)    . ferrous sulfate 325 (65 FE) MG EC tablet TAKE 1 TABLET BY MOUTH EVERY DAY (Patient not taking: No sig reported) 90 tablet 1  . glipiZIDE (GLUCOTROL XL) 10 MG 24 hr tablet Take by mouth. 1 QAM 1 QPM (Patient not taking: No sig reported)    . ondansetron (ZOFRAN) 8 MG tablet Take 8 mg by mouth 30 to 60 min prior to Cytoxan administration then take 8 mg twice daily as needed for nausea and vomiting. (Patient not taking: No sig reported) 30 tablet 1  . prochlorperazine (COMPAZINE) 10 MG tablet Take 1 tablet (10 mg  total) by mouth every 6 (six) hours as needed (Nausea or vomiting). (Patient not taking: No sig reported) 30 tablet 1  . rosuvastatin (CRESTOR) 20 MG tablet Take 20 mg by mouth daily.  (Patient not taking: No sig reported)    . sulfamethoxazole-trimethoprim (BACTRIM DS) 800-160 MG tablet Take 1 tablet by mouth 2 (two) times daily. (Patient not taking: No sig reported)    . traMADol (ULTRAM) 50 MG tablet Take 1 tablet (50 mg total) by mouth every 6 (six) hours as needed for severe pain. (Patient not taking: No sig reported) 8 tablet 0   No current facility-administered medications for this visit.     PHYSICAL EXAMINATION: ECOG PERFORMANCE STATUS: 1 - Symptomatic but completely ambulatory Vitals:   06/29/20 0848  BP: 128/81  Pulse: 82  Resp: 18  Temp: 98.1 F (36.7 C)   Filed Weights   06/29/20 0848  Weight: 159 lb 12.8 oz (72.5 kg)    Physical Exam Constitutional:      General: She is not in acute distress. HENT:     Head: Normocephalic and atraumatic.  Eyes:     General: No scleral icterus. Cardiovascular:     Rate and Rhythm: Normal rate and regular rhythm.     Heart sounds: Normal heart sounds.  Pulmonary:     Effort: Pulmonary effort is normal. No respiratory distress.     Breath sounds: No wheezing.  Abdominal:     General: Bowel sounds are normal. There is no distension.     Palpations: Abdomen is soft.  Musculoskeletal:        General: Swelling present. No deformity. Normal range of motion.     Cervical back: Normal range of motion and neck supple.     Comments: Bilateral lower extremity 1+ edema  Skin:    General: Skin is warm and dry.     Findings: No erythema or rash.  Neurological:     Mental Status: She is alert and oriented to person, place, and time. Mental status is at baseline.     Cranial Nerves: No cranial nerve deficit.     Coordination: Coordination normal.  Psychiatric:        Mood and Affect: Mood normal.     LABORATORY DATA:  I have  reviewed the data as listed Lab Results  Component Value Date   WBC 6.7 06/29/2020   HGB 11.7 (L) 06/29/2020   HCT 33.9 (L) 06/29/2020   MCV 73.2 (L) 06/29/2020   PLT 421 (H) 06/29/2020   Recent Labs    09/02/19 1608 09/02/19 1608 03/16/20 1124 05/24/20 1048 06/17/20 1139  06/29/20 0830  NA 138  --  135  --  133* 136  K 3.6  --  3.8  --  3.5 3.3*  CL 100  --  100  --  100 106  CO2 29  --  24  --  24 22  GLUCOSE 216*  --  278*  --  452* 194*  BUN 24*  --  25*  --  17 18  CREATININE 1.15*  --  1.37* 1.00 0.97 1.00  CALCIUM 9.4  --  9.7  --  8.7* 8.6*  GFRNONAA 47*   < > 40* 59* >60 58*  GFRAA 54*  --   --   --   --   --   PROT 8.4*  --  7.7  --  6.3* 6.1*  ALBUMIN 3.9  --  3.1*  --  2.1* 2.1*  AST 31  --  66*  --  52* 75*  ALT 27  --  55*  --  29 36  ALKPHOS 170*  --  520*  --  846* 941*  BILITOT 1.1  --  1.0  --  1.0 1.7*   < > = values in this interval not displayed.   Iron/TIBC/Ferritin/ %Sat    Component Value Date/Time   IRON 45 09/02/2019 1608   TIBC 416 09/02/2019 1608   FERRITIN 35 09/02/2019 1608   IRONPCTSAT 11 09/02/2019 1608     08/25/2019, platelet count 491, WBC 7.5, hemoglobin 12 Creatinine 1.58, EGFR 37, calcium 10.8, albumin 4.2 Negative hepatitis B surface antigen, hepatitis B core antibody, Negative hepatitis C 08/05/2019, A1c 11.2   RADIOGRAPHIC STUDIES: I have personally reviewed the radiological images as listed and agreed with the findings in the report. MR Lumbar Spine W Wo Contrast  Result Date: 05/11/2020 CLINICAL DATA:  bone lesion on MRI abd EXAM: MRI LUMBAR SPINE WITHOUT AND WITH CONTRAST TECHNIQUE: Multiplanar and multiecho pulse sequences of the lumbar spine were obtained without and with intravenous contrast. CONTRAST:  55m GADAVIST GADOBUTROL 1 MMOL/ML IV SOLN COMPARISON:  04/07/2020. FINDINGS: Segmentation:  Standard. Alignment:  Minimal grade 1 L4-5 anterolisthesis. Vertebrae: Vertebral body heights are preserved. Mild Modic type 2  endplate degenerative changes. T1/T2 heterogenous signal involving the entirety of the L1 vertebral body with associated STIR hyperintensity and heterogenous enhancement. No other lesion identified. Conus medullaris and cauda equina: Conus extends to the L2 level. Conus and cauda equina appear normal. Disc levels: Multilevel desiccation. T12-L1: Small central protrusion. Patent spinal canal and neural foramen. L1-2: Minimal disc bulge and bilateral facet degenerative spurring. Patent spinal canal and neural foramen. L2-3: Minimal disc bulge with superimposed bilateral extraforaminal protrusions. Facet degenerative spurring. Patent spinal canal and neural foramen. L3-4: Disc bulge with superimposed left foraminal protrusion. Facet degenerative spurring. Patent spinal canal. Mild bilateral neural foraminal narrowing. L4-5: Uncovered bulge with small superimposed left subarticular protrusion. Bilateral facet hypertrophy and ligamentum flavum thickening. Mild spinal canal and bilateral neural foraminal narrowing. L5-S1: Disc bulge with shallow central protrusion. Bilateral facet degenerative spurring. Patent spinal canal. Mild bilateral neural foraminal narrowing. Paraspinal and other soft tissues: Negative. 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 If suspicion persists short-term interval follow-up in 8-12 weeks with postcontrast MRI may be considered. Otherwise no aggressive osseous lesion. Multilevel spondylosis as detailed above. Electronically Signed   By: CPrimitivo GauzeM.D.   On: 05/11/2020 14:35   MR Abdomen W Wo Contrast  Result Date: 04/07/2020 CLINICAL DATA:  Elevated liver enzymes. Follow-up indeterminate  cystic liver lesion seen on recent ultrasound. Monoclonal gammopathy of of unknown significance. EXAM: MRI ABDOMEN WITHOUT AND WITH CONTRAST TECHNIQUE: Multiplanar multisequence MR imaging of the abdomen was performed both before and after the administration of  intravenous contrast. CONTRAST:  42m GADAVIST GADOBUTROL 1 MMOL/ML IV SOLN COMPARISON:  Ultrasound on 03/28/2020 FINDINGS: Lower chest: No acute findings. Hepatobiliary: 2 minimally complicated cysts are seen lateral segment of the left lobe, measuring 1.5 cm and 1.2 cm in maximum diameter. Both of these lesions contain a few thin internal septations, but no evidence of thickened septations enhancing soft tissue component. A poorly defined hypovascular lesion is seen in the posterior right hepatic lobe which measures 1.4 cm on image 51/17. A probable similar but smaller lesion is seen in the anterior right lobe measuring 9 mm on image 38/17. These lesions are not seen on T2 or diffusion weighted sequences, which although nonspecific, are suggestive of benign etiology. Several tiny less than 5 mm gallstones are seen, however there is no evidence of cholecystitis or biliary ductal dilatation. Pancreas:  No mass or inflammatory changes. Spleen:  Within normal limits in size and appearance. Adrenals/Urinary Tract: No masses identified. A few small renal cysts noted bilaterally. No evidence of hydronephrosis. Stomach/Bowel: Visualized portion unremarkable. Vascular/Lymphatic: No pathologically enlarged lymph nodes identified. No abdominal aortic aneurysm. Other:  None. Musculoskeletal: Diffuse T2 hyperintensity and abnormal contrast enhancement are seen throughout the L1 vertebral body, raising suspicion for multiple myeloma in this patient with known MGUS. IMPRESSION: Two benign cystic lesions in left hepatic lobe, which correspond with the lesions seen on recent ultrasound. 2 nonspecific hypovascular lesions in the right hepatic lobe, largest measuring 1.4 cm. Recommend continued follow-up by MRI in 6 months. Abnormal bone marrow signal throughout the L1 vertebral body, raising suspicion for multiple myeloma in this patient with known MGUS. Consider dedicated lumbar spine MRI without and with contrast or PET-CT for  further evaluation. Cholelithiasis. No radiographic evidence of cholecystitis or biliary ductal dilatation. Electronically Signed   By: JMarlaine HindM.D.   On: 04/07/2020 11:03   [place]BIOPSY (LIVER)  Result Date: 05/11/2020 INDICATION: Elevated alkaline phosphatase and liver transaminase levels. EXAM: ULTRASOUND GUIDED CORE BIOPSY OF LIVER MEDICATIONS: None. ANESTHESIA/SEDATION: Fentanyl 100 mcg IV; Versed 2.0 mg IV Moderate Sedation Time:  15 minutes. The patient was continuously monitored during the procedure by the interventional radiology nurse under my direct supervision. PROCEDURE: The procedure, risks, benefits, and alternatives were explained to the patient. Questions regarding the procedure were encouraged and answered. The patient understands and consents to the procedure. A time-out was performed prior to initiating the procedure. The liver was localized by ultrasound. The abdominal wall was prepped with chlorhexidine in a sterile fashion, and a sterile drape was applied covering the operative field. A sterile gown and sterile gloves were used for the procedure. Local anesthesia was provided with 1% Lidocaine. A 17 gauge trocar needle was advanced into the right lobe of the liver. Three separate coaxial 18 gauge core biopsy samples were obtained and submitted in formalin. Gel-Foam pledgets were advanced through the outer needle as the needle was retracted and removed. Additional ultrasound was performed. COMPLICATIONS: None immediate. FINDINGS: Solid core biopsy samples were obtained from the liver parenchyma. There were no immediate bleeding complications. IMPRESSION: Ultrasound-guided core biopsy performed of the liver within the right lobe parenchyma. Electronically Signed   By: GAletta EdouardM.D.   On: 05/11/2020 11:38   CT BONE MARROW BIOPSY & ASPIRATION  Result Date: 06/09/2020  INDICATION: 75 year old female with a history of monoclonal gammopathy referred for bone marrow biopsy EXAM: CT  BONE MARROW BIOPSY AND ASPIRATION MEDICATIONS: None. ANESTHESIA/SEDATION: Moderate (conscious) sedation was employed during this procedure. A total of Versed 1.0 mg and Fentanyl 50 mcg was administered intravenously. Moderate Sedation Time: 10 minutes. The patient's level of consciousness and vital signs were monitored continuously by radiology nursing throughout the procedure under my direct supervision. FLUOROSCOPY TIME:  CT COMPLICATIONS: None PROCEDURE: The procedure risks, benefits, and alternatives were explained to the patient. Questions regarding the procedure were encouraged and answered. The patient understands and consents to the procedure. Scout CT of the pelvis was performed for surgical planning purposes. The right posterior pelvis was prepped with Chlorhexidine in a sterile fashion, and a sterile drape was applied covering the operative field. A sterile gown and sterile gloves were used for the procedure. Local anesthesia was provided with 1% Lidocaine. Right posterior iliac bone was targeted for biopsy. The skin and subcutaneous tissues were infiltrated with 1% lidocaine without epinephrine. A small stab incision was made with an 11 blade scalpel, and an 11 gauge Murphy needle was advanced with CT guidance to the posterior cortex. Manual forced was used to advance the needle through the posterior cortex and the stylet was removed. A bone marrow aspirate was retrieved and passed to a cytotechnologist in the room. The Murphy needle was then advanced without the stylet for a core biopsy. The core biopsy was retrieved and also passed to a cytotechnologist. Manual pressure was used for hemostasis and a sterile dressing was placed. No complications were encountered no significant blood loss was encountered. Patient tolerated the procedure well and remained hemodynamically stable throughout. IMPRESSION: Status post CT-guided bone marrow biopsy, with tissue specimen sent to pathology for complete  histopathologic analysis Signed, Dulcy Fanny. Earleen Newport, DO [facility] Electronically Signed   By: Corrie Mckusick D.O.   On: 06/09/2020 09:58   ECHOCARDIOGRAM LIMITED  Result Date: 06/01/2020    ECHOCARDIOGRAM LIMITED REPORT   Patient Name:   EMELIA SANDOVAL Date of Exam: 06/01/2020 Medical Rec #:  [id number]         Height:       63.5 in Accession #:    [id number]        Weight:       146.5 lb Date of Birth:  [birth year]          BSA:          1.704 m Patient Age:    26 years          BP:           143/83 mmHg Patient Gender: F                 HR:           84 bpm. Exam Location:  [facility] Procedure: 2D Echo, Cardiac Doppler, Color Doppler and Strain Analysis Indications:     Amyloidosis- unspecified type  History:         Patient has no prior history of Echocardiogram examinations.  Sonographer:     Sherrie Sport RDCS (AE) Referring Phys:  [id number] Immanuel Fedak Diagnosing Phys: Kate Sable MD  Sonographer Comments: Global longitudinal strain was attempted. IMPRESSIONS  1. Left ventricular ejection fraction, by estimation, is 60 to 65%. The left ventricle has normal function. There is mild asymmetric left ventricular hypertrophy of the basal-septal segment. Left ventricular diastolic parameters are indeterminate. The average left  ventricular global longitudinal strain is -16.8 %.  2. Right ventricular systolic function is normal.  3. The mitral valve is normal in structure. Mild mitral valve regurgitation.  4. The aortic valve is tricuspid. Aortic valve regurgitation is not visualized. Mild aortic valve sclerosis is present, with no evidence of aortic valve stenosis.  5. The inferior vena cava is normal in size with greater than 50% respiratory variability, suggesting right atrial pressure of 3 mmHg. Conclusion(s)/Recommendation(s): No echocardiographic findings to suggest cardiac amyloid on this study. FINDINGS  Left Ventricle: Left ventricular ejection fraction, by  estimation, is 60 to 65%. The left ventricle has normal function. The average left ventricular global longitudinal strain is -16.8 %. The left ventricular internal cavity size was normal in size. There is mild asymmetric left ventricular hypertrophy of the basal-septal segment. Left ventricular diastolic parameters are indeterminate. Right Ventricle: No increase in right ventricular wall thickness. Right ventricular systolic function is normal. Left Atrium: Left atrial size was normal in size. Right Atrium: Right atrial size was normal in size. Pericardium: There is no evidence of pericardial effusion. Mitral Valve: The mitral valve is normal in structure. Mild mitral valve regurgitation. Tricuspid Valve: The tricuspid valve is normal in structure. Tricuspid valve regurgitation is not demonstrated. Aortic Valve: The aortic valve is tricuspid. Aortic valve regurgitation is not visualized. Mild aortic valve sclerosis is present, with no evidence of aortic valve stenosis. Aortic valve mean gradient measures 5.0 mmHg. Aortic valve peak gradient measures 8.2 mmHg. Aortic valve area, by VTI measures 1.58 cm. Pulmonic Valve: The pulmonic valve was normal in structure. Pulmonic valve regurgitation is trivial. Aorta: The aortic root is normal in size and structure. Venous: The inferior vena cava is normal in size with greater than 50% respiratory variability, suggesting right atrial pressure of 3 mmHg. LEFT VENTRICLE PLAX 2D LVIDd:         4.28 cm  Diastology LVIDs:         2.44 cm  LV e' medial:    5.66 cm/s LV PW:         1.72 cm  LV E/e' medial:  13.6 LV IVS:        1.01 cm  LV e' lateral:   5.11 cm/s LVOT diam:     2.00 cm  LV E/e' lateral: 15.1 LV SV:         49 LV SV Index:   29       2D Longitudinal Strain LVOT Area:     3.14 cm 2D Strain GLS Avg:     -16.8 %                          3D Volume EF:                         3D EF:        62 %                         LV EDV:       115 ml                         LV ESV:        44 ml                         LV SV:  71 ml RIGHT VENTRICLE RV Basal diam:  3.11 cm RV S prime:     10.10 cm/s TAPSE (M-mode): 3.1 cm LEFT ATRIUM             Index       RIGHT ATRIUM           Index LA diam:        3.80 cm 2.23 cm/m  RA Area:     13.60 cm LA Vol (A2C):   47.7 ml 27.99 ml/m RA Volume:   32.60 ml  19.13 ml/m LA Vol (A4C):   46.4 ml 27.23 ml/m LA Biplane Vol: 49.3 ml 28.93 ml/m  AORTIC VALVE                    PULMONIC VALVE AV Area (Vmax):    1.62 cm     PV Vmax:        0.65 m/s AV Area (Vmean):   1.56 cm     PV Peak grad:   1.7 mmHg AV Area (VTI):     1.58 cm     RVOT Peak grad: 2 mmHg AV Vmax:           143.00 cm/s AV Vmean:          104.667 cm/s AV VTI:            0.309 m AV Peak Grad:      8.2 mmHg AV Mean Grad:      5.0 mmHg LVOT Vmax:         73.80 cm/s LVOT Vmean:        52.000 cm/s LVOT VTI:          0.156 m LVOT/AV VTI ratio: 0.50  AORTA Ao Root diam: 3.10 cm MITRAL VALVE               TRICUSPID VALVE MV Area (PHT): 4.54 cm    TR Peak grad:   11.2 mmHg MV Decel Time: 167 msec    TR Vmax:        167.00 cm/s MV E velocity: 77.10 cm/s MV A velocity: 72.00 cm/s  SHUNTS MV E/A ratio:  1.07        Systemic VTI:  0.16 m                            Systemic Diam: 2.00 cm Kate Sable MD Electronically signed by Kate Sable MD Signature Date/Time: 06/01/2020/4:06:38 PM    Final       ASSESSMENT & PLAN:  1. Light chain (AL) amyloidosis (HCC)   2. Goals of care, counseling/discussion   3. Encounter for antineoplastic chemotherapy   4. Transaminitis    # AL-amyloidosis Diagnosis of AL amyloidosis was discussed with patient.  Patient has biopsy confirmed liver involvement, possible kidney involvement- I had another lengthy discussion with patient and his son and explained more details about the rationale and potential side effects of Dara-CyborD regimen.  Patient has also met our pharmacist twice and had detailed discussion about the potential side effects.   Patient reports having good understanding about this chemotherapy regimen and agrees with proceeding with treatment today Labs are reviewed and discussed with patient.  Proceed with his first cycle day 1  Dara-CyborD.  She will take her weekly dose of cyclophosphamide 500 mg today. She meets our pharmacist today again for follow-up  #Transaminitis, progressively worsening of elevated alkaline phosphatase, new onset of hyperbilirubinemia From  liver involvement of AL amyloidosis. Velcade will be dose decreased 0.7 mg/m2 for the first cycle.  I may titrate up if she tolerates.  #Anxiety, patient has establish care with palliative care service.  Was suggested to start Remeron 7.5 mg at nighttime. #Itching, probably due to increase of T bili #Hypokalemia, potassium level 3.2.  Recommend patient to take oral potassium chloride 22mq daily for 3 days.  Prescription sent to pharmacy [facility].  Supportive care measures are necessary for patient well-being and will be provided as necessary. We spent sufficient time to discuss many aspect of care, questions were answered to patient's satisfaction.  All questions were answered. The patient knows to call the clinic with any problems questions or concerns.  cc FLeonel Ramsay MD  We spent sufficient time to discuss many aspect of care, questions were answered to patient's satisfaction. A total of 40 minutes was spent on this visit.  With 5 minutes spent reviewing lab results, 25 minutes counseling the patient on the diagnosis, goal of care, chemotherapy treatments, side effects of the treatment, management of symptoms.  Additional 10 minutes was spent on answering patient/family's questions.    ZEarlie Server MD, PhD Hematology Oncology [facility]at AIntegr[facility] HospitalPager- [phone]/16/2022

## 2020-06-30 ENCOUNTER — Telehealth: Payer: Self-pay

## 2020-06-30 NOTE — Telephone Encounter (Signed)
Telephone call to patient for follow up after receiving first infusion.   Patient states infusion went great.  States eating good and drinking plenty of fluids.   Denies any nausea or vomiting.  Encouraged patient to call for any concerns or questions. 

## 2020-07-01 ENCOUNTER — Other Ambulatory Visit: Payer: Self-pay

## 2020-07-01 ENCOUNTER — Ambulatory Visit (INDEPENDENT_AMBULATORY_CARE_PROVIDER_SITE_OTHER): Payer: Medicare Other | Admitting: Internal Medicine

## 2020-07-01 ENCOUNTER — Encounter: Payer: Self-pay | Admitting: Internal Medicine

## 2020-07-01 VITALS — BP 132/70 | HR 85 | Ht 63.5 in | Wt 162.0 lb

## 2020-07-01 DIAGNOSIS — E1169 Type 2 diabetes mellitus with other specified complication: Secondary | ICD-10-CM | POA: Insufficient documentation

## 2020-07-01 DIAGNOSIS — E785 Hyperlipidemia, unspecified: Secondary | ICD-10-CM

## 2020-07-01 DIAGNOSIS — I10 Essential (primary) hypertension: Secondary | ICD-10-CM | POA: Diagnosis not present

## 2020-07-01 DIAGNOSIS — R9431 Abnormal electrocardiogram [ECG] [EKG]: Secondary | ICD-10-CM | POA: Diagnosis not present

## 2020-07-01 DIAGNOSIS — E859 Amyloidosis, unspecified: Secondary | ICD-10-CM

## 2020-07-01 NOTE — Progress Notes (Signed)
New Outpatient Visit Date: 07/01/2020  Referring Provider: [name], MD [place],  [place] [address]  Chief Complaint: Amyloidosis  HPI:  Nancy Rodriguez is a 75 y.o. female who is being seen today for the evaluation of possible cardiac amyloidosis at the request of Dr. Tasia Catchings. She has a history of recently diagnosed AL amyloidosis with liver and renal involvement, hypertension, hyperlipidemia, type 2 diabetes mellitus, rheumatoid arthritis, and chronic kidney disease stage.  She was previously evaluated by Dr. Naida Sleight at [facility], most recently 2015, for shortness of breath and fatigue.  She was referred for exercise myocardial perfusion stress testing, though I do not see any results in Care Everywhere.  Today, Nancy Rodriguez denies having had any cardiac symptoms in the past but was following annually with cardiology for routine follow-up.  Today, Nancy Rodriguez reports that she is feeling well.  Work-up for amyloidosis initially began with the incidental finding of abnormal LFTs.  Ultimately, abdominal and spine imaging followed by bone marrow biopsy confirmed her diagnosis.  She was recently started on cyclophosphamide by Dr. Tasia Catchings.  Nancy Rodriguez denies shortness of breath, chest pain, palpitations, and lightheadedness.  She has experienced sporadic swelling in her feet and ankles, which she attributes to eating too much salt.  She wears compression stockings at times to control her leg swelling.  She has not had any orthopnea nor edema.  She plans to seek a second opinion regarding her amyloidosis referral at a tertiary care center (likely [facility] given that she has been seen there in the past).  --------------------------------------------------------------------------------------------------  Cardiovascular History & Procedures: Cardiovascular Problems:  Question cardiac amyloidosis and abnormal EKG  Risk Factors:  Hypertension, hyperlipidemia, diabetes mellitus, and age greater than  22  Cath/PCI:  None  CV Surgery:  None  EP Procedures and Devices:  None  Non-Invasive Evaluation(s):  TTE (06/01/2020): Normal LV size with mild asymmetric hypertrophy of the basal septum.  LVEF 60-65% with GLS -16.8%.  Diastolic parameters indeterminate.  Normal RV size and function.  Mild mitral regurgitation.  No obvious findings of cardiac amyloidosis.  Exercise stress echocardiogram (04/01/2012, [facility]): Normal study.  Recent CV Pertinent Labs: Lab Results  Component Value Date   INR 1.1 05/20/2020   K 3.3 (L) 06/29/2020   BUN 18 06/29/2020   CREATININE 1.00 06/29/2020    --------------------------------------------------------------------------------------------------  Past Medical History:  Diagnosis Date  . Anemia    Anemia in chronic kidney disease  . Chronic kidney disease    Stage 3b chronic kidney disease  . Diabetes mellitus without complication ([place])   . Hypercholesterolemia   . Hypertension   . MGUS (monoclonal gammopathy of unknown significance)   . Osteoarthritis   . Rheumatoid arthritis [facility])     Past Surgical History:  Procedure Laterality Date  . HERNIA REPAIR    . PARATHYROIDECTOMY      Current Meds  Medication Sig  . cyclophosphamide (CYTOXAN) 50 MG capsule Take 10 capsules (500 mg total) by mouth once a week. Take with breakfast.  . docusate sodium (COLACE) 100 MG capsule Take 1 capsule by mouth as needed.  Marland Kitchen EPINEPHrine 0.3 mg/0.3 mL IJ SOAJ injection as needed  . insulin glargine (LANTUS SOLOSTAR) 100 UNIT/ML Solostar Pen Inject 10 Units into the skin at bedtime.  . metoprolol succinate (TOPROL-XL) 50 MG 24 hr tablet TAKE 1 TABLET (50 MG TOTAL) BY MOUTH DAILY IN THE MORNING  . mirtazapine (REMERON) 7.5 MG tablet Take 1 tablet (7.5 mg total) by mouth at  bedtime.  . ondansetron (ZOFRAN) 8 MG tablet Take 8 mg by mouth 30 to 60 min prior to Cytoxan administration then take 8 mg twice daily as needed for nausea and vomiting.  . potassium  chloride SA (KLOR-CON) 20 MEQ tablet Take 1 tablet (20 mEq total) by mouth daily.  . traMADol (ULTRAM) 50 MG tablet Take 1 tablet (50 mg total) by mouth every 6 (six) hours as needed for severe pain.    Allergies: Benazepril, Lisinopril, Tolmetin, and Nsaids  Social History   Tobacco Use  . Smoking status: Never Smoker  . Smokeless tobacco: Never Used  Substance Use Topics  . Alcohol use: No  . Drug use: Never    Family History  Problem Relation Age of Onset  . Diabetes Daughter   . Diabetes Son     Review of Systems: A 12-system review of systems was performed and was negative except as noted in the HPI.  --------------------------------------------------------------------------------------------------  Physical Exam: BP 132/70 (BP Location: Left Arm, Patient Position: Sitting, Cuff Size: Normal)   Pulse 85   Ht 5' 3.5" (1.613 m)   Wt 162 lb (73.5 kg)   SpO2 96%   BMI 28.25 kg/m   General: NAD.  Accompanied by her daughter in person and son via FaceTime. HEENT: No conjunctival pallor or scleral icterus. Facemask in place. Neck: Supple without lymphadenopathy, thyromegaly, JVD, or HJR. No carotid bruit. Lungs: Normal work of breathing. Clear to auscultation bilaterally without wheezes or crackles. Heart: Regular rate and rhythm with 1/6 systolic murmur.  No rubs or gallops. Abd: Bowel sounds present. Soft, NT/ND without hepatosplenomegaly Ext: Trace edema overlying the dorsal aspects of both feet; otherwise no significant edema. Radial, PT, and DP pulses are 2+ bilaterally Skin: Warm and dry without rash. Neuro: CNIII-XII intact. Strength and fine-touch sensation intact in upper and lower extremities bilaterally. Psych: Normal mood and affect.  EKG: Normal sinus rhythm with low voltage, inferior infarct, and poor R wave progression.  No significant change since prior tracing on 12/01/2014.  Lab Results  Component Value Date   WBC 6.7 06/29/2020   HGB 11.7 (L)  06/29/2020   HCT 33.9 (L) 06/29/2020   MCV 73.2 (L) 06/29/2020   PLT 421 (H) 06/29/2020    Lab Results  Component Value Date   NA 136 06/29/2020   K 3.3 (L) 06/29/2020   CL 106 06/29/2020   CO2 22 06/29/2020   BUN 18 06/29/2020   CREATININE 1.00 06/29/2020   GLUCOSE 194 (H) 06/29/2020   ALT 36 06/29/2020    --------------------------------------------------------------------------------------------------  ASSESSMENT AND PLAN: Amyloidosis and abnormal EKG: Nancy Rodriguez was recently diagnosed with biopsy-proven amyloidosis and is now receiving cyclophosphamide under the direction of Dr. Tasia Catchings.  She is asymptomatic from a cardiac standpoint except for mild dependent swelling of her feet.  She appears euvolemic on examination today.  Soft systolic murmur is noted, corresponding to mild mitral regurgitation seen on recent echo.  Recent echo also was notable for mild basal septal hypertrophy.  LVEF and global longitudinal strain were normal.  Diastolic parameters were indeterminate.  EKG today is unchanged from 2016 but is notable for low voltage as well as Q waves involving the anteroseptal and inferior leads.  Nancy Rodriguez is at risk for both infiltrative cardiomyopathy and ischemic heart disease given her multiple risk factors (HTN, HLD, DM2, and age).  I have recommended obtaining a cardiac MRI to further evaluate for amyloid involvement of the heart as well as prior infarct(s) to explain  her EKG findings.  As she was previously seen at [facility] and will likely return there for a second opinion regarding her amyloidosis diagnosis/treatment, she wishes to have this test performed there.  We will attempt to order the MRI through [facility].  In the meantime, we will defer medication changes.  She is already on metoprolol succinate.  She had a severe reaction to ACE inhibitors in the past and therefore will not be rechallenged with an ACE inhibitor or ARB.  If cardiac MRI is unrevealing, repeat ischemia evaluation  (favor pharmacologic MPI) will need to be considered.  Hypertension: Blood pressure mildly elevated today (goal BP less than 130/70).  I will defer medication changes today.  Hyperlipidemia associated with type 2 diabetes mellitus: Ms. recent lipid panel for review was performed in 07/2019 in the [facility] system.  This showed total cholesterol 188, triglycerides 226, and LDL 108.  While Nancy Rodriguez would potentially benefit from statin therapy given her ongoing evaluation for ASCVD as well as history of diabetes mellitus, I will defer medication changes at this time given her history of abnormal LFTs and recently initiated amyloidosis treatment.  Ongoing management per her PCP.  Follow-up: Return to clinic after completion of cardiac MRI (in ~4 weeks).  Nelva Bush, MD 07/01/2020 8:31 AM

## 2020-07-01 NOTE — Patient Instructions (Signed)
Medication Instructions:  Your physician recommends that you continue on your current medications as directed. Please refer to the Current Medication list given to you today.  *If you need a refill on your cardiac medications before your next appointment, please call your pharmacy*  Lab Work: none If you have labs (blood work) drawn today and your tests are completely normal, you will receive your results only by: Marland Kitchen MyChart Message (if you have MyChart) OR . A paper copy in the mail If you have any lab test that is abnormal or we need to change your treatment, we will call you to review the results.  Testing/Procedures: Your physician has requested that you have a cardiac MRI. Cardiac MRI uses a computer to create images of your heart as its beating, producing both still and moving pictures of your heart and major blood vessels. For further information please visit [email or web address] Please follow the instruction sheet given to you today for more information.  You have requested to have performed at [facility].  I will send the order to them and usually someone from their scheduling will give you a call to have it arranged.  If you do not receive a call by the end of next week, Please give [place] a call.  Follow-Up: At [facility], you and your health needs are our priority.  As part of our continuing mission to provide you with exceptional heart care, we have created designated Provider Care Teams.  These Care Teams include your primary Cardiologist (physician) and Advanced Practice Providers (APPs -  Physician Assistants and Nurse Practitioners) who all work together to provide you with the care you need, when you need it.  We recommend signing up for the patient portal called "MyChart".  Sign up information is provided on this After Visit Summary.  MyChart is used to connect with patients for Virtual Visits (Telemedicine).  Patients are able to view lab/test results, encounter notes, upcoming  appointments, etc.  Non-urgent messages can be sent to your provider as well.   To learn more about what you can do with MyChart, go to [email or web address].    Your next appointment:   4 week(s)  The format for your next appointment:   In Person  Provider:   You may see DR [name] END or one of the following Advanced Practice Providers on your designated Care Team:    Murray Hodgkins, NP  Christell Faith, PA-C  Marrianne Mood, PA-C  [employer], [place]  Laurann Montana, NP

## 2020-07-04 ENCOUNTER — Telehealth: Payer: Self-pay | Admitting: *Deleted

## 2020-07-04 ENCOUNTER — Telehealth: Payer: Self-pay | Admitting: Internal Medicine

## 2020-07-04 DIAGNOSIS — E859 Amyloidosis, unspecified: Secondary | ICD-10-CM

## 2020-07-04 NOTE — Telephone Encounter (Signed)
Spoke with patient regarding preferred scheduling weekdays and times for the Cardiac MRI ordered by Dr. Saunders Revel.  Informed patient as soon as we hear regarding the insurance prior authorization, I will be in touch with the appointment information.  Patient voiced her understanding.

## 2020-07-04 NOTE — Telephone Encounter (Signed)
Patient seen by Dr End in office on 07/01/20. Dr End requested Cardiac MRI at [facility] per patient preference of location.  Signed order faxed to [facility] successfully. They should call patient to schedule.  Patient was told to call us later this week if she did not hear anything from [facility].

## 2020-07-05 ENCOUNTER — Other Ambulatory Visit (HOSPITAL_BASED_OUTPATIENT_CLINIC_OR_DEPARTMENT_OTHER): Payer: Self-pay

## 2020-07-05 NOTE — Telephone Encounter (Signed)
Order for Cardiac MRI at [facility] has been faxed to them. Awaiting the precert to send to [facility]- staff message sent to [facility].  Attempted to let patient know an update. No answer. Left message to call back.

## 2020-07-05 NOTE — Telephone Encounter (Signed)
Spoke with patient regarding the Tuesday 08/16/20 12:00pm Cardiac MRI appointment at [facility]----patient states her son had requested this testing be scheduled at [facility].  Please advise.

## 2020-07-05 NOTE — Telephone Encounter (Signed)
RE: Cardiac MRI at [facility]: Today [place], [place] Fillers, Eliberto Ivory, RN Good afternoon,   No precert required for this patient. She's good to go.   Thanks,   Estill Bamberg

## 2020-07-06 ENCOUNTER — Encounter: Payer: Self-pay | Admitting: Oncology

## 2020-07-06 ENCOUNTER — Inpatient Hospital Stay (HOSPITAL_BASED_OUTPATIENT_CLINIC_OR_DEPARTMENT_OTHER): Payer: Medicare Other | Admitting: Oncology

## 2020-07-06 ENCOUNTER — Inpatient Hospital Stay: Payer: Medicare Other

## 2020-07-06 VITALS — BP 114/75 | HR 76 | Temp 98.4°F | Resp 18 | Wt 164.8 lb

## 2020-07-06 VITALS — BP 154/80 | HR 80 | Temp 97.5°F | Resp 16

## 2020-07-06 DIAGNOSIS — Z5112 Encounter for antineoplastic immunotherapy: Secondary | ICD-10-CM | POA: Diagnosis not present

## 2020-07-06 DIAGNOSIS — E8581 Light chain (AL) amyloidosis: Secondary | ICD-10-CM | POA: Diagnosis not present

## 2020-07-06 DIAGNOSIS — N1831 Chronic kidney disease, stage 3a: Secondary | ICD-10-CM | POA: Diagnosis not present

## 2020-07-06 DIAGNOSIS — Z5111 Encounter for antineoplastic chemotherapy: Secondary | ICD-10-CM | POA: Diagnosis not present

## 2020-07-06 DIAGNOSIS — D631 Anemia in chronic kidney disease: Secondary | ICD-10-CM | POA: Insufficient documentation

## 2020-07-06 DIAGNOSIS — E859 Amyloidosis, unspecified: Secondary | ICD-10-CM

## 2020-07-06 DIAGNOSIS — R7401 Elevation of levels of liver transaminase levels: Secondary | ICD-10-CM

## 2020-07-06 DIAGNOSIS — D509 Iron deficiency anemia, unspecified: Secondary | ICD-10-CM | POA: Insufficient documentation

## 2020-07-06 LAB — CBC WITH DIFFERENTIAL/PLATELET
Abs Immature Granulocytes: 0.02 K/uL (ref 0.00–0.07)
Basophils Absolute: 0 K/uL (ref 0.0–0.1)
Basophils Relative: 0 %
Eosinophils Absolute: 0.2 K/uL (ref 0.0–0.5)
Eosinophils Relative: 4 %
HCT: 32 % — ABNORMAL LOW (ref 36.0–46.0)
Hemoglobin: 10.9 g/dL — ABNORMAL LOW (ref 12.0–15.0)
Immature Granulocytes: 0 %
Lymphocytes Relative: 21 %
Lymphs Abs: 1.1 K/uL (ref 0.7–4.0)
MCH: 24.9 pg — ABNORMAL LOW (ref 26.0–34.0)
MCHC: 34.1 g/dL (ref 30.0–36.0)
MCV: 73.2 fL — ABNORMAL LOW (ref 80.0–100.0)
Monocytes Absolute: 0.4 K/uL (ref 0.1–1.0)
Monocytes Relative: 9 %
Neutro Abs: 3.3 K/uL (ref 1.7–7.7)
Neutrophils Relative %: 66 %
Platelets: 285 K/uL (ref 150–400)
RBC: 4.37 MIL/uL (ref 3.87–5.11)
RDW: 22.4 % — ABNORMAL HIGH (ref 11.5–15.5)
WBC: 5 K/uL (ref 4.0–10.5)
nRBC: 0 % (ref 0.0–0.2)

## 2020-07-06 LAB — COMPREHENSIVE METABOLIC PANEL
ALT: 36 U/L (ref 0–44)
AST: 64 U/L — ABNORMAL HIGH (ref 15–41)
Albumin: 1.7 g/dL — ABNORMAL LOW (ref 3.5–5.0)
Alkaline Phosphatase: 988 U/L — ABNORMAL HIGH (ref 38–126)
Anion gap: 8 (ref 5–15)
BUN: 16 mg/dL (ref 8–23)
CO2: 23 mmol/L (ref 22–32)
Calcium: 8 mg/dL — ABNORMAL LOW (ref 8.9–10.3)
Chloride: 102 mmol/L (ref 98–111)
Creatinine, Ser: 1.08 mg/dL — ABNORMAL HIGH (ref 0.44–1.00)
GFR, Estimated: 53 mL/min — ABNORMAL LOW (ref >60)
Glucose, Bld: 284 mg/dL — ABNORMAL HIGH (ref 70–99)
Potassium: 3.9 mmol/L (ref 3.5–5.1)
Sodium: 133 mmol/L — ABNORMAL LOW (ref 135–145)
Total Bilirubin: 1.5 mg/dL — ABNORMAL HIGH (ref 0.3–1.2)
Total Protein: 5.6 g/dL — ABNORMAL LOW (ref 6.5–8.1)

## 2020-07-06 MED ORDER — BORTEZOMIB CHEMO SQ INJECTION 3.5 MG (2.5MG/ML)
1.3000 mg/m2 | Freq: Once | INTRAMUSCULAR | Status: AC
  Administered 2020-07-06: 2.25 mg via SUBCUTANEOUS
  Filled 2020-07-06: qty 0.9

## 2020-07-06 MED ORDER — MONTELUKAST SODIUM 10 MG PO TABS
10.0000 mg | ORAL_TABLET | Freq: Once | ORAL | Status: AC
  Administered 2020-07-06: 10 mg via ORAL
  Filled 2020-07-06: qty 1

## 2020-07-06 MED ORDER — DIPHENHYDRAMINE HCL 25 MG PO CAPS
50.0000 mg | ORAL_CAPSULE | Freq: Once | ORAL | Status: AC
  Administered 2020-07-06: 50 mg via ORAL
  Filled 2020-07-06: qty 2

## 2020-07-06 MED ORDER — DARATUMUMAB-HYALURONIDASE-FIHJ 1800-30000 MG-UT/15ML ~~LOC~~ SOLN
1800.0000 mg | Freq: Once | SUBCUTANEOUS | Status: AC
  Administered 2020-07-06: 1800 mg via SUBCUTANEOUS
  Filled 2020-07-06: qty 15

## 2020-07-06 MED ORDER — ACETAMINOPHEN 325 MG PO TABS
650.0000 mg | ORAL_TABLET | Freq: Once | ORAL | Status: AC
  Administered 2020-07-06: 650 mg via ORAL
  Filled 2020-07-06: qty 2

## 2020-07-06 MED ORDER — DEXAMETHASONE 4 MG PO TABS
40.0000 mg | ORAL_TABLET | Freq: Once | ORAL | Status: AC
  Administered 2020-07-06: 40 mg via ORAL
  Filled 2020-07-06: qty 10

## 2020-07-06 NOTE — Progress Notes (Signed)
Patient denies new problems/concerns today.   °

## 2020-07-06 NOTE — Progress Notes (Signed)
Hematology/Oncology  Follow up note [facility] Telephone:([phone] Fax:(336) [phone]   Patient Care Team: Leonel Ramsay, MD as PCP - General (Infectious Diseases)  REFERRING PROVIDER: Leonel Ramsay, MD  CHIEF COMPLAINTS/REASON FOR VISIT:  Follow-up for amyloidosis  HISTORY OF PRESENTING ILLNESS:   Nancy Rodriguez is a  75 y.o.  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  Work up include protein electrophoresis showed M protein 0.7,   Reviewed her previous medical records via care everywhere.  She was seen by Hematology Oncology at [facility] on 02/14/2017.  07/25/2007  SPEP showed M protein of 0.35, IFE showed IgG lamda 09/22/2007  Bone survey negative.  02/15/14 IgG 930, SPEP M protien 0.22, free kappa light chain 3.59, lamda 2.92, ratio 1.23  Hypercalcemia, resolved after stopping HCTZ.  # Transaminitis 03/28/20 US abdomen showed left liver lobe Complex 1.7 cm cyst  # 04/07/20 MRI abdomen w/wo contrast showed 2 benign liver cysts, 2 nonspecific hypovascular irighr liver lesions, abnormal bone marrow signal of L1 vertebral body. Patient was  advised to proceed with bone marrow biopsy and she declined.  # referred her to established care with GI and was seen on 04/19/20,  # Liver biopsy showed amyloidosis. [place] MS/MS showed AL type # 05/20/20 recommend bone marrow biopsy which is scheduled on 05/24/2020.  Patient changed her mind and ask a biopsy to be canceled.  My team and I had multiple phone discussion with patient's daughter Nancy Rodriguez and later with the patient's son Nancy Rodriguez.  Patient agreed with bone marrow biopsy and a biopsy was scheduled on 06/09/2020.  05/20/20 NT proBNP  was normal,  TSH slightly increased, normal T4 Normal factor X Normal coags Troponin 19. Refer to  cardiology for evaluation. May need cardiac MRI  # 06/01/2020 2D echo result was reviewed and discussed with patient.  Patient has normal LVEF 60-65%. Mild asymmetric left ventricular hypertrophy.  Left ventricular diastolic parameters are indeterminate. average left ventricular global longitudinal strain is -16.8 %.  # 06/09/2020, bone marrow biopsy showed monoclonal plasmacytosis, 8%, amyloid deposit present.  Absent iron stores. 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 t (14;20) #06/17/2020, further discussed about diagnosis and treatment plan.  Patient declined bone marrow transplant evaluation.  Decision was made to proceed with Dara-CyborD chemotherapy treatments.  INTERVAL HISTORY Nancy Rodriguez is a 75 y.o. female who has above history reviewed by me today presents for follow up visit for management of AL amyloidosis Problems and complaints are listed below: Patient presents for the evaluation prior to Dara-CyborD treatment. She tolerated first treatment last week.  No significant side effects. She did not mention any abdominal pain or nighttime itchiness today. Accompanied by daughter.  She also called her son who stayed connected by phone for the entire encounter Patient has seen cardiology Dr. Saunders Revel and was recommended to do cardiac MRI which patient prefers to have it done at [facility].  Review of Systems  Constitutional: Negative for appetite change, chills, fatigue and fever.  HENT:   Negative for hearing loss, nosebleeds and voice change.   Eyes: Negative for eye problems.  Respiratory: Positive for chest tightness. Negative for cough.   Cardiovascular: Positive for leg swelling. Negative for chest pain.  Gastrointestinal: Negative for abdominal distention, abdominal pain and  blood in stool.  Endocrine: Negative for hot flashes.  Genitourinary: Negative for difficulty urinating and frequency.   Musculoskeletal: Negative for  arthralgias.  Skin: Negative for itching and rash.  Neurological: Negative for extremity weakness.  Hematological: Negative for adenopathy.  Psychiatric/Behavioral: Negative for confusion.    MEDICAL HISTORY:  Past Medical History:  Diagnosis Date  . Anemia    Anemia in chronic kidney disease  . Chronic kidney disease    Stage 3b chronic kidney disease  . Diabetes mellitus without complication ([place])   . Hypercholesterolemia   . Hypertension   . MGUS (monoclonal gammopathy of unknown significance)   . Osteoarthritis   . Rheumatoid arthritis ([place])     SURGICAL HISTORY: Past Surgical History:  Procedure Laterality Date  . HERNIA REPAIR    . PARATHYROIDECTOMY      SOCIAL HISTORY: Social History   Socioeconomic History  . Marital status: Widowed    Spouse name: Not on file  . Number of children: 6  . Years of education: Not on file  . Highest education level: Not on file  Occupational History  . Not on file  Tobacco Use  . Smoking status: Never Smoker  . Smokeless tobacco: Never Used  Substance and Sexual Activity  . Alcohol use: No  . Drug use: Never  . Sexual activity: Not on file  Other Topics Concern  . Not on file  Social History Narrative  . Not on file   Social Determinants of Health   Financial Resource Strain: Not on file  Food Insecurity: Not on file  Transportation Needs: Not on file  Physical Activity: Not on file  Stress: Not on file  Social Connections: Not on file  Intimate Partner Violence: Not on file    FAMILY HISTORY: Family History  Problem Relation Age of Onset  . Diabetes Daughter   . Diabetes Son   . Dementia Mother   . Cancer Father   . Esophageal cancer Sister   . Brain cancer Brother     ALLERGIES:  is allergic to benazepril, lisinopril, tolmetin, and nsaids.  MEDICATIONS:  Current Outpatient Medications  Medication Sig Dispense Refill  . acyclovir (ZOVIRAX) 400 MG tablet Take 1 tablet (400 mg total) by mouth 2 (two)  times daily. 60 tablet 5  . amLODipine (NORVASC) 5 MG tablet Take by mouth.    . cyclophosphamide (CYTOXAN) 50 MG capsule Take 10 capsules (500 mg total) by mouth once a week. Take with breakfast. 40 capsule 3  . docusate sodium (COLACE) 100 MG capsule Take 1 capsule by mouth as needed.    Marland Kitchen EPINEPHrine 0.3 mg/0.3 mL IJ SOAJ injection as needed    . insulin glargine (LANTUS SOLOSTAR) 100 UNIT/ML Solostar Pen Inject 10 Units into the skin at bedtime.    . metoprolol succinate (TOPROL-XL) 50 MG 24 hr tablet TAKE 1 TABLET (50 MG TOTAL) BY MOUTH DAILY IN THE MORNING    . mirtazapine (REMERON) 7.5 MG tablet Take 1 tablet (7.5 mg total) by mouth at bedtime. 30 tablet 2  . ondansetron (ZOFRAN) 8 MG tablet Take 8 mg by mouth 30 to 60 min prior to Cytoxan administration then take 8 mg twice daily as needed for nausea and vomiting. 30 tablet 1  . traMADol (ULTRAM) 50 MG tablet Take 1 tablet (50 mg total) by mouth every 6 (six) hours as needed for severe pain. 8 tablet 0  . albuterol (VENTOLIN HFA) 108 (90 Base) MCG/ACT inhaler Inhale 2 puffs into the lungs  every 4 (four) hours as needed. (Patient not taking: No sig reported)    . potassium chloride SA (KLOR-CON) 20 MEQ tablet Take 1 tablet (20 mEq total) by mouth daily. (Patient not taking: Reported on 07/06/2020) 3 tablet 0   No current facility-administered medications for this visit.     PHYSICAL EXAMINATION: ECOG PERFORMANCE STATUS: 1 - Symptomatic but completely ambulatory Vitals:   07/06/20 0950  BP: 114/75  Pulse: 76  Resp: 18  Temp: 98.4 F (36.9 C)   Filed Weights   07/06/20 0950  Weight: 164 lb 12.8 oz (74.8 kg)    Physical Exam Constitutional:      General: She is not in acute distress. HENT:     Head: Normocephalic and atraumatic.  Eyes:     General: No scleral icterus. Cardiovascular:     Rate and Rhythm: Normal rate and regular rhythm.     Heart sounds: Normal heart sounds.  Pulmonary:     Effort: Pulmonary effort is  normal. No respiratory distress.     Breath sounds: No wheezing.  Abdominal:     General: Bowel sounds are normal. There is no distension.     Palpations: Abdomen is soft.  Musculoskeletal:        General: Swelling present. No deformity. Normal range of motion.     Cervical back: Normal range of motion and neck supple.     Comments: Bilateral lower extremity 1+ edema  Skin:    General: Skin is warm and dry.     Findings: No erythema or rash.  Neurological:     Mental Status: She is alert and oriented to person, place, and time. Mental status is at baseline.     Cranial Nerves: No cranial nerve deficit.     Coordination: Coordination normal.  Psychiatric:        Mood and Affect: Mood normal.     LABORATORY DATA:  I have reviewed the data as listed Lab Results  Component Value Date   WBC 5.0 07/06/2020   HGB 10.9 (L) 07/06/2020   HCT 32.0 (L) 07/06/2020   MCV 73.2 (L) 07/06/2020   PLT 285 07/06/2020   Recent Labs    09/02/19 1608 03/16/20 1124 06/17/20 1139 06/29/20 0830 07/06/20 0917  NA 138   < > 133* 136 133*  K 3.6   < > 3.5 3.3* 3.9  CL 100   < > 100 106 102  CO2 29   < > _0 GLUCOSE 216*   < > 452* 194* 284*  BUN 24*   < > _1 CREATININE 1.15*   < > 0.97 1.00 1.08*  CALCIUM 9.4   < > 8.7* 8.6* 8.0*  GFRNONAA 47*   < > >60 58* 53*  GFRAA 54*  --   --   --   --   PROT 8.4*   < > 6.3* 6.1* 5.6*  ALBUMIN 3.9   < > 2.1* 2.1* 1.7*  AST 31   < > 52* 75* 64*  ALT 27   < > 29 36 36  ALKPHOS 170*   < > 846* 941* 988*  BILITOT 1.1   < > 1.0 1.7* 1.5*   < > = values in this interval not displayed.   Iron/TIBC/Ferritin/ %Sat    Component Value Date/Time   IRON 45 09/02/2019 1608   TIBC 416 09/02/2019 1608   FERRITIN 35 09/02/2019 1608   IRONPCTSAT 11 09/02/2019 1608  08/25/2019, platelet count 491, WBC 7.5, hemoglobin 12 Creatinine 1.58, EGFR 37, calcium 10.8, albumin 4.2 Negative hepatitis B surface antigen, hepatitis B core antibody, Negative  hepatitis C 08/05/2019, A1c 11.2   RADIOGRAPHIC STUDIES: I have personally reviewed the radiological images as listed and agreed with the findings in the report. MR Lumbar Spine W Wo Contrast  Result Date: 05/11/2020 CLINICAL DATA:  bone lesion on MRI abd EXAM: MRI LUMBAR SPINE WITHOUT AND WITH CONTRAST TECHNIQUE: Multiplanar and multiecho pulse sequences of the lumbar spine were obtained without and with intravenous contrast. CONTRAST:  6m GADAVIST GADOBUTROL 1 MMOL/ML IV SOLN COMPARISON:  04/07/2020. FINDINGS: Segmentation:  Standard. Alignment:  Minimal grade 1 L4-5 anterolisthesis. Vertebrae: Vertebral body heights are preserved. Mild Modic type 2 endplate degenerative changes. T1/T2 heterogenous signal involving the entirety of the L1 vertebral body with associated STIR hyperintensity and heterogenous enhancement. No other lesion identified. Conus medullaris and cauda equina: Conus extends to the L2 level. Conus and cauda equina appear normal. Disc levels: Multilevel desiccation. T12-L1: Small central protrusion. Patent spinal canal and neural foramen. L1-2: Minimal disc bulge and bilateral facet degenerative spurring. Patent spinal canal and neural foramen. L2-3: Minimal disc bulge with superimposed bilateral extraforaminal protrusions. Facet degenerative spurring. Patent spinal canal and neural foramen. L3-4: Disc bulge with superimposed left foraminal protrusion. Facet degenerative spurring. Patent spinal canal. Mild bilateral neural foraminal narrowing. L4-5: Uncovered bulge with small superimposed left subarticular protrusion. Bilateral facet hypertrophy and ligamentum flavum thickening. Mild spinal canal and bilateral neural foraminal narrowing. L5-S1: Disc bulge with shallow central protrusion. Bilateral facet degenerative spurring. Patent spinal canal. Mild bilateral neural foraminal narrowing. Paraspinal and other soft tissues: Negative. 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 If suspicion persists short-term interval follow-up in 8-12 weeks with postcontrast MRI may be considered. Otherwise no aggressive osseous lesion. Multilevel spondylosis as detailed above. Electronically Signed   By: CPrimitivo GauzeM.D.   On: 05/11/2020 14:35   [place]BIOPSY (LIVER)  Result Date: 05/11/2020 INDICATION: Elevated alkaline phosphatase and liver transaminase levels. EXAM: ULTRASOUND GUIDED CORE BIOPSY OF LIVER MEDICATIONS: None. ANESTHESIA/SEDATION: Fentanyl 100 mcg IV; Versed 2.0 mg IV Moderate Sedation Time:  15 minutes. The patient was continuously monitored during the procedure by the interventional radiology nurse under my direct supervision. PROCEDURE: The procedure, risks, benefits, and alternatives were explained to the patient. Questions regarding the procedure were encouraged and answered. The patient understands and consents to the procedure. A time-out was performed prior to initiating the procedure. The liver was localized by ultrasound. The abdominal wall was prepped with chlorhexidine in a sterile fashion, and a sterile drape was applied covering the operative field. A sterile gown and sterile gloves were used for the procedure. Local anesthesia was provided with 1% Lidocaine. A 17 gauge trocar needle was advanced into the right lobe of the liver. Three separate coaxial 18 gauge core biopsy samples were obtained and submitted in formalin. Gel-Foam pledgets were advanced through the outer needle as the needle was retracted and removed. Additional ultrasound was performed. COMPLICATIONS: None immediate. FINDINGS: Solid core biopsy samples were obtained from the liver parenchyma. There were no immediate bleeding complications. IMPRESSION: Ultrasound-guided core biopsy performed of the liver within the right lobe parenchyma. Electronically Signed   By: GAletta EdouardM.D.   On: 05/11/2020 11:38   CT BONE MARROW BIOPSY & ASPIRATION  Result Date:  06/09/2020 INDICATION: 75year old female with a history of monoclonal gammopathy referred for bone marrow biopsy EXAM: CT BONE  MARROW BIOPSY AND ASPIRATION MEDICATIONS: None. ANESTHESIA/SEDATION: Moderate (conscious) sedation was employed during this procedure. A total of Versed 1.0 mg and Fentanyl 50 mcg was administered intravenously. Moderate Sedation Time: 10 minutes. The patient's level of consciousness and vital signs were monitored continuously by radiology nursing throughout the procedure under my direct supervision. FLUOROSCOPY TIME:  CT COMPLICATIONS: None PROCEDURE: The procedure risks, benefits, and alternatives were explained to the patient. Questions regarding the procedure were encouraged and answered. The patient understands and consents to the procedure. Scout CT of the pelvis was performed for surgical planning purposes. The right posterior pelvis was prepped with Chlorhexidine in a sterile fashion, and a sterile drape was applied covering the operative field. A sterile gown and sterile gloves were used for the procedure. Local anesthesia was provided with 1% Lidocaine. Right posterior iliac bone was targeted for biopsy. The skin and subcutaneous tissues were infiltrated with 1% lidocaine without epinephrine. A small stab incision was made with an 11 blade scalpel, and an 11 gauge Murphy needle was advanced with CT guidance to the posterior cortex. Manual forced was used to advance the needle through the posterior cortex and the stylet was removed. A bone marrow aspirate was retrieved and passed to a cytotechnologist in the room. The Murphy needle was then advanced without the stylet for a core biopsy. The core biopsy was retrieved and also passed to a cytotechnologist. Manual pressure was used for hemostasis and a sterile dressing was placed. No complications were encountered no significant blood loss was encountered. Patient tolerated the procedure well and remained hemodynamically stable  throughout. IMPRESSION: Status post CT-guided bone marrow biopsy, with tissue specimen sent to pathology for complete histopathologic analysis Signed, Dulcy Fanny. Earleen Newport, DO [facility] Radiology Electronically Signed   By: Corrie Mckusick D.O.   On: 06/09/2020 09:58   ECHOCARDIOGRAM LIMITED  Result Date: 06/01/2020    ECHOCARDIOGRAM LIMITED REPORT   Patient Name:   YUETTE PUTNAM Date of Exam: 06/01/2020 Medical Rec #:  [id number]         Height:       63.5 in Accession #:    [id number]        Weight:       146.5 lb Date of Birth:  16-Nov-1945          BSA:          1.704 m Patient Age:    63 years          BP:           143/83 mmHg Patient Gender: F                 HR:           84 bpm. Exam Location:  [facility] Procedure: 2D Echo, Cardiac Doppler, Color Doppler and Strain Analysis Indications:     Amyloidosis- unspecified type  History:         Patient has no prior history of Echocardiogram examinations.  Sonographer:     Sherrie Sport RDCS (AE) Referring Phys:  [id number] Lizett Chowning Diagnosing Phys: Kate Sable MD  Sonographer Comments: Global longitudinal strain was attempted. IMPRESSIONS  1. Left ventricular ejection fraction, by estimation, is 60 to 65%. The left ventricle has normal function. There is mild asymmetric left ventricular hypertrophy of the basal-septal segment. Left ventricular diastolic parameters are indeterminate. The average left ventricular global longitudinal strain is -16.8 %.  2. Right ventricular systolic function is normal.  3. The  mitral valve is normal in structure. Mild mitral valve regurgitation.  4. The aortic valve is tricuspid. Aortic valve regurgitation is not visualized. Mild aortic valve sclerosis is present, with no evidence of aortic valve stenosis.  5. The inferior vena cava is normal in size with greater than 50% respiratory variability, suggesting right atrial pressure of 3 mmHg. Conclusion(s)/Recommendation(s): No echocardiographic  findings to suggest cardiac amyloid on this study. FINDINGS  Left Ventricle: Left ventricular ejection fraction, by estimation, is 60 to 65%. The left ventricle has normal function. The average left ventricular global longitudinal strain is -16.8 %. The left ventricular internal cavity size was normal in size. There is mild asymmetric left ventricular hypertrophy of the basal-septal segment. Left ventricular diastolic parameters are indeterminate. Right Ventricle: No increase in right ventricular wall thickness. Right ventricular systolic function is normal. Left Atrium: Left atrial size was normal in size. Right Atrium: Right atrial size was normal in size. Pericardium: There is no evidence of pericardial effusion. Mitral Valve: The mitral valve is normal in structure. Mild mitral valve regurgitation. Tricuspid Valve: The tricuspid valve is normal in structure. Tricuspid valve regurgitation is not demonstrated. Aortic Valve: The aortic valve is tricuspid. Aortic valve regurgitation is not visualized. Mild aortic valve sclerosis is present, with no evidence of aortic valve stenosis. Aortic valve mean gradient measures 5.0 mmHg. Aortic valve peak gradient measures 8.2 mmHg. Aortic valve area, by VTI measures 1.58 cm. Pulmonic Valve: The pulmonic valve was normal in structure. Pulmonic valve regurgitation is trivial. Aorta: The aortic root is normal in size and structure. Venous: The inferior vena cava is normal in size with greater than 50% respiratory variability, suggesting right atrial pressure of 3 mmHg. LEFT VENTRICLE PLAX 2D LVIDd:         4.28 cm  Diastology LVIDs:         2.44 cm  LV e' medial:    5.66 cm/s LV PW:         1.72 cm  LV E/e' medial:  13.6 LV IVS:        1.01 cm  LV e' lateral:   5.11 cm/s LVOT diam:     2.00 cm  LV E/e' lateral: 15.1 LV SV:         49 LV SV Index:   29       2D Longitudinal Strain LVOT Area:     3.14 cm 2D Strain GLS Avg:     -16.8 %                          3D Volume EF:                          3D EF:        62 %                         LV EDV:       115 ml                         LV ESV:       44 ml                         LV SV:        71 ml RIGHT VENTRICLE RV Basal diam:  3.11 cm RV S prime:  10.10 cm/s TAPSE (M-mode): 3.1 cm LEFT ATRIUM             Index       RIGHT ATRIUM           Index LA diam:        3.80 cm 2.23 cm/m  RA Area:     13.60 cm LA Vol (A2C):   47.7 ml 27.99 ml/m RA Volume:   32.60 ml  19.13 ml/m LA Vol (A4C):   46.4 ml 27.23 ml/m LA Biplane Vol: 49.3 ml 28.93 ml/m  AORTIC VALVE                    PULMONIC VALVE AV Area (Vmax):    1.62 cm     PV Vmax:        0.65 m/s AV Area (Vmean):   1.56 cm     PV Peak grad:   1.7 mmHg AV Area (VTI):     1.58 cm     RVOT Peak grad: 2 mmHg AV Vmax:           143.00 cm/s AV Vmean:          104.667 cm/s AV VTI:            0.309 m AV Peak Grad:      8.2 mmHg AV Mean Grad:      5.0 mmHg LVOT Vmax:         73.80 cm/s LVOT Vmean:        52.000 cm/s LVOT VTI:          0.156 m LVOT/AV VTI ratio: 0.50  AORTA Ao Root diam: 3.10 cm MITRAL VALVE               TRICUSPID VALVE MV Area (PHT): 4.54 cm    TR Peak grad:   11.2 mmHg MV Decel Time: 167 msec    TR Vmax:        167.00 cm/s MV E velocity: 77.10 cm/s MV A velocity: 72.00 cm/s  SHUNTS MV E/A ratio:  1.07        Systemic VTI:  0.16 m                            Systemic Diam: 2.00 cm Kate Sable MD Electronically signed by Kate Sable MD Signature Date/Time: 06/01/2020/4:06:38 PM    Final       ASSESSMENT & PLAN:  1. Light chain (AL) amyloidosis (HCC)   2. Encounter for antineoplastic chemotherapy   3. Transaminitis   4. 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[place])   5. Microcytic anemia    # AL-amyloidosis Diagnosis of AL amyloidosis was discussed with patient.  Patient has biopsy confirmed liver involvement, possible kidney involvement- Overall she tolerates well. Labs are reviewed and discussed with patient. Proceed with first cycle day 8  Dara-CyBorD She will take her weekly dose of cyclophosphamide 500 mg today.  #Transaminitis, progressively worsening of elevated alkaline phosphatase, new onset of hyperbilirubinemia From liver involvement of AL amyloidosis. Bilirubinemia has decreased to 1.5.  I will increase Velcade to 1.31m/m2 #Microcytic anemia, check iron panel #Anxiety, patient has establish care with palliative care service.  Was suggested to start Remeron 7.5 mg at nighttime. #Hypokalemia, potassium level 3.9.  [facility]. #Chronic kidney disease, follow-up with nephrology. Supportive care measures are necessary for patient well-being and will be  provided as necessary. We spent sufficient time to discuss many aspect of care, questions were answered to patient's satisfaction.  All questions were answered. The patient knows to call the clinic with any problems questions or concerns.  cc Leonel Ramsay, MD   [name], MD, PhD Hematology Oncology [facility] Pager- [phone] 07/06/2020

## 2020-07-06 NOTE — Telephone Encounter (Signed)
Called [facility] to follow up on referral. Unable to get ahold of referral coordinator.

## 2020-07-06 NOTE — Progress Notes (Signed)
Patient monitored x 1 hour post Darzalex injection. Patient tolerated injection well and denies any s/s at this time. Discharged home.

## 2020-07-07 NOTE — Telephone Encounter (Signed)
Spoke to [facility]. At [place] in [place]. States she can schedule patient for Cardiac MRI with them. She will need Order form (which she will email to me), demo, insurance and clinical note, and precert, if required. Then fax to [phone]. Once all is received she will call patient to schedule.   Received order form.  Placed on Dr Darnelle Bos desk for him to complete and sign along with other necessary documents to fax to [facility].

## 2020-07-07 NOTE — Telephone Encounter (Signed)
Spoke to referral coordinator, Amy, who confirmed that referral was received and that she has sent to physicians for review. She will call pt with appt once she hears back from physicians.

## 2020-07-08 NOTE — Telephone Encounter (Signed)
Cardiac MRI order formed signed by Dr End and faxed along with other documentation to [phone] successfully.

## 2020-07-08 NOTE — Telephone Encounter (Signed)
Called patient and gave her [facility] Cardiac MRI number to call and schedule sometime next week if she doesn't hear anything from them.  She verbalized understanding.

## 2020-07-12 ENCOUNTER — Telehealth: Payer: Self-pay | Admitting: Pharmacist

## 2020-07-12 NOTE — Telephone Encounter (Signed)
Oral Chemotherapy Pharmacist Encounter   Ms. Wire called because she was having some eye itching and swelling. This is not new, but she realized so forgot to mention it at her last visit on 07/06/20. She wanted me to review her medication allergy list to make sure there is no issue wuth the new prescriptions from the cancer center. Based on her allergy list in Epic, there is no cross reaction with her new medications.  She did not report any other swelling or trouble breathing. Thinks it may be her ondansetron so she is going to stop that.  This is likely seasonal allergies causing her eye itching and swelling. Dr. Tasia Catchings notified, Ms. Fraser as an appt tomorrow in clinic.   Darl Pikes, PharmD, BCPS, BCOP, CPP Hematology/Oncology Clinical Pharmacist [facility] [phone]  07/12/2020 4:34 PM

## 2020-07-13 ENCOUNTER — Ambulatory Visit: Payer: Medicare Other

## 2020-07-13 ENCOUNTER — Other Ambulatory Visit: Payer: Self-pay

## 2020-07-13 ENCOUNTER — Inpatient Hospital Stay: Payer: Medicare Other

## 2020-07-13 ENCOUNTER — Encounter: Payer: Self-pay | Admitting: Oncology

## 2020-07-13 ENCOUNTER — Inpatient Hospital Stay (HOSPITAL_BASED_OUTPATIENT_CLINIC_OR_DEPARTMENT_OTHER): Payer: Medicare Other | Admitting: Oncology

## 2020-07-13 VITALS — BP 155/74 | HR 90 | Temp 98.0°F | Resp 18

## 2020-07-13 VITALS — BP 146/75 | HR 96 | Temp 98.3°F | Resp 18 | Wt 165.2 lb

## 2020-07-13 DIAGNOSIS — E859 Amyloidosis, unspecified: Secondary | ICD-10-CM

## 2020-07-13 DIAGNOSIS — D631 Anemia in chronic kidney disease: Secondary | ICD-10-CM

## 2020-07-13 DIAGNOSIS — N1831 Chronic kidney disease, stage 3a: Secondary | ICD-10-CM

## 2020-07-13 DIAGNOSIS — E8581 Light chain (AL) amyloidosis: Secondary | ICD-10-CM

## 2020-07-13 DIAGNOSIS — Z5111 Encounter for antineoplastic chemotherapy: Secondary | ICD-10-CM

## 2020-07-13 DIAGNOSIS — Z5112 Encounter for antineoplastic immunotherapy: Secondary | ICD-10-CM | POA: Diagnosis not present

## 2020-07-13 LAB — CBC WITH DIFFERENTIAL/PLATELET
Abs Immature Granulocytes: 0.04 10*3/uL (ref 0.00–0.07)
Basophils Absolute: 0 10*3/uL (ref 0.0–0.1)
Basophils Relative: 1 %
Eosinophils Absolute: 0.1 10*3/uL (ref 0.0–0.5)
Eosinophils Relative: 1 %
HCT: 34.9 % — ABNORMAL LOW (ref 36.0–46.0)
Hemoglobin: 11.9 g/dL — ABNORMAL LOW (ref 12.0–15.0)
Immature Granulocytes: 1 %
Lymphocytes Relative: 22 %
Lymphs Abs: 0.9 10*3/uL (ref 0.7–4.0)
MCH: 25.1 pg — ABNORMAL LOW (ref 26.0–34.0)
MCHC: 34.1 g/dL (ref 30.0–36.0)
MCV: 73.6 fL — ABNORMAL LOW (ref 80.0–100.0)
Monocytes Absolute: 0.4 10*3/uL (ref 0.1–1.0)
Monocytes Relative: 10 %
Neutro Abs: 2.7 10*3/uL (ref 1.7–7.7)
Neutrophils Relative %: 65 %
Platelets: 397 10*3/uL (ref 150–400)
RBC: 4.74 MIL/uL (ref 3.87–5.11)
RDW: 22.9 % — ABNORMAL HIGH (ref 11.5–15.5)
WBC: 4.2 10*3/uL (ref 4.0–10.5)
nRBC: 0.5 % — ABNORMAL HIGH (ref 0.0–0.2)

## 2020-07-13 LAB — COMPREHENSIVE METABOLIC PANEL
ALT: 52 U/L — ABNORMAL HIGH (ref 0–44)
AST: 94 U/L — ABNORMAL HIGH (ref 15–41)
Albumin: 1.9 g/dL — ABNORMAL LOW (ref 3.5–5.0)
Alkaline Phosphatase: 1196 U/L — ABNORMAL HIGH (ref 38–126)
Anion gap: 8 (ref 5–15)
BUN: 16 mg/dL (ref 8–23)
CO2: 27 mmol/L (ref 22–32)
Calcium: 8.7 mg/dL — ABNORMAL LOW (ref 8.9–10.3)
Chloride: 102 mmol/L (ref 98–111)
Creatinine, Ser: 1.1 mg/dL — ABNORMAL HIGH (ref 0.44–1.00)
GFR, Estimated: 52 mL/min — ABNORMAL LOW (ref >60)
Glucose, Bld: 173 mg/dL — ABNORMAL HIGH (ref 70–99)
Potassium: 3.8 mmol/L (ref 3.5–5.1)
Sodium: 137 mmol/L (ref 135–145)
Total Bilirubin: 3.3 mg/dL — ABNORMAL HIGH (ref 0.3–1.2)
Total Protein: 5.7 g/dL — ABNORMAL LOW (ref 6.5–8.1)

## 2020-07-13 MED ORDER — DEXAMETHASONE 4 MG PO TABS
40.0000 mg | ORAL_TABLET | Freq: Once | ORAL | Status: AC
  Administered 2020-07-13: 40 mg via ORAL
  Filled 2020-07-13: qty 10

## 2020-07-13 MED ORDER — DARATUMUMAB-HYALURONIDASE-FIHJ 1800-30000 MG-UT/15ML ~~LOC~~ SOLN
1800.0000 mg | Freq: Once | SUBCUTANEOUS | Status: AC
  Administered 2020-07-13: 1800 mg via SUBCUTANEOUS
  Filled 2020-07-13: qty 15

## 2020-07-13 MED ORDER — ACETAMINOPHEN 325 MG PO TABS
650.0000 mg | ORAL_TABLET | Freq: Once | ORAL | Status: AC
  Administered 2020-07-13: 650 mg via ORAL
  Filled 2020-07-13: qty 2

## 2020-07-13 MED ORDER — DIPHENHYDRAMINE HCL 25 MG PO CAPS
50.0000 mg | ORAL_CAPSULE | Freq: Once | ORAL | Status: AC
  Administered 2020-07-13: 50 mg via ORAL
  Filled 2020-07-13: qty 2

## 2020-07-13 MED ORDER — BORTEZOMIB CHEMO SQ INJECTION 3.5 MG (2.5MG/ML)
0.7000 mg/m2 | Freq: Once | INTRAMUSCULAR | Status: AC
  Administered 2020-07-13: 1.25 mg via SUBCUTANEOUS
  Filled 2020-07-13: qty 0.5

## 2020-07-13 MED ORDER — MONTELUKAST SODIUM 10 MG PO TABS
10.0000 mg | ORAL_TABLET | Freq: Once | ORAL | Status: AC
  Administered 2020-07-13: 10 mg via ORAL
  Filled 2020-07-13: qty 1

## 2020-07-13 NOTE — Progress Notes (Signed)
AST 94, Alkaline Phosphatase 1196, Bilirubin 3.3, Per Dr. Tasia Catchings okay to proceed with Darzalex and Velcade treatment. Pt to be monitored 15 minutes post Darzalex per Dr. Tasia Catchings.   4619: Pt tolerated treatment well. Injection site WNL, pt monitored 15 minutes post Darzalex per MD. No s/s of distress or reaction noted. Pt and VS stable at discharge.

## 2020-07-13 NOTE — Progress Notes (Signed)
Hematology/Oncology  Follow up note [facility] [phone] [phone]   Patient Care Team: Leonel Ramsay, MD as PCP - General (Infectious Diseases)  REFERRING PROVIDER: Leonel Ramsay, MD  CHIEF COMPLAINTS/REASON FOR VISIT:  Follow-up for amyloidosis  HISTORY OF PRESENTING ILLNESS:   Nancy Rodriguez is a  75 y.o.  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  Work up include protein electrophoresis showed M protein 0.7,   Reviewed her previous medical records via care everywhere.  She was seen by Hematology Oncology at [facility] on 02/14/2017.  07/25/2007  SPEP showed M protein of 0.35, IFE showed IgG lamda 09/22/2007  Bone survey negative.  02/15/14 IgG 930, SPEP M protien 0.22, free kappa light chain 3.59, lamda 2.92, ratio 1.23  Hypercalcemia, resolved after stopping HCTZ.  # Transaminitis 03/28/20 US abdomen showed left liver lobe Complex 1.7 cm cyst  # 04/07/20 MRI abdomen w/wo contrast showed 2 benign liver cysts, 2 nonspecific hypovascular irighr liver lesions, abnormal bone marrow signal of L1 vertebral body. Patient was  advised to proceed with bone marrow biopsy and she declined.  # referred her to established care with GI and was seen on 04/19/20,  # Liver biopsy showed amyloidosis. [place] MS/MS showed AL type # 05/20/20 recommend bone marrow biopsy which is scheduled on 05/24/2020.  Patient changed her mind and ask a biopsy to be canceled.  My team and I had multiple phone discussion with patient's daughter [name] and later with [name].  Patient agreed with bone marrow biopsy and a biopsy was scheduled on 06/09/2020.  05/20/20 NT proBNP  was normal,  TSH slightly increased, normal T4 Normal factor X Normal coags Troponin 19. Refer to  cardiology for evaluation. May need cardiac MRI  # 06/01/2020 2D echo result was reviewed and discussed with patient.  Patient has normal LVEF 60-65%. Mild asymmetric left ventricular hypertrophy.  Left ventricular diastolic parameters are indeterminate. average left ventricular global longitudinal strain is -16.8 %.  # 06/09/2020, bone marrow biopsy showed monoclonal plasmacytosis, 8%, amyloid deposit present.  Absent iron stores. 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 t (14;20) #06/17/2020, further discussed about diagnosis and treatment plan.  Patient declined bone marrow transplant evaluation.  Decision was made to proceed with Dara-CyborD chemotherapy treatments.  INTERVAL HISTORY KELLYN MCCARY is a 75 y.o. female who has above history reviewed by me today presents for follow up visit for management of AL amyloidosis Problems and complaints are listed below: Patient presents for the evaluation prior to Dara-CyborD treatment. She was accompanied by her daughter.  She also called her son who stayed connected by phone for the entire encounter. She has been referred to establish care with [facility], has not had an appointment yet. Patient reports sensation of skin burning since last visit. She also felt nauseated after the treatment and vomited once after last treatment. Otherwise no new symptoms.  Denies any other nausea vomiting episodes, no diarrhea. She has experienced right eye watery lately. .  Review of Systems  Constitutional: Negative for appetite change, chills, fatigue and fever.  HENT:   Negative for hearing loss, nosebleeds and voice change.   Eyes: Negative for eye problems.  Respiratory: Negative for chest tightness and cough.   Cardiovascular: Positive  for leg swelling. Negative for chest pain.  Gastrointestinal: Positive for nausea. Negative for abdominal distention, abdominal pain and blood in stool.  Endocrine: Negative for  hot flashes.  Genitourinary: Negative for difficulty urinating and frequency.   Musculoskeletal: Negative for arthralgias.  Skin: Negative for itching and rash.  Neurological: Positive for numbness. Negative for extremity weakness.  Hematological: Negative for adenopathy.  Psychiatric/Behavioral: Negative for confusion.    MEDICAL HISTORY:  Past Medical History:  Diagnosis Date  . Anemia    Anemia in chronic kidney disease  . Chronic kidney disease    Stage 3b chronic kidney disease  . Diabetes mellitus without complication ([place])   . Hypercholesterolemia   . Hypertension   . MGUS (monoclonal gammopathy of unknown significance)   . Osteoarthritis   . Rheumatoid arthritis ([place])     SURGICAL HISTORY: Past Surgical History:  Procedure Laterality Date  . HERNIA REPAIR    . PARATHYROIDECTOMY      SOCIAL HISTORY: Social History   Socioeconomic History  . Marital status: Widowed    Spouse name: Not on file  . Number of children: 6  . Years of education: Not on file  . Highest education level: Not on file  Occupational History  . Not on file  Tobacco Use  . Smoking status: Never Smoker  . Smokeless tobacco: Never Used  Substance and Sexual Activity  . Alcohol use: No  . Drug use: Never  . Sexual activity: Not on file  Other Topics Concern  . Not on file  Social History Narrative  . Not on file   Social Determinants of Health   Financial Resource Strain: Not on file  Food Insecurity: Not on file  Transportation Needs: Not on file  Physical Activity: Not on file  Stress: Not on file  Social Connections: Not on file  Intimate Partner Violence: Not on file    FAMILY HISTORY: Family History  Problem Relation Age of Onset  . Diabetes Daughter   . Diabetes Son   . Dementia Mother   . Cancer Father   . Esophageal cancer Sister   . Brain cancer Brother     ALLERGIES:  is allergic to benazepril, lisinopril, tolmetin, and nsaids.  MEDICATIONS:  Current  Outpatient Medications  Medication Sig Dispense Refill  . acyclovir (ZOVIRAX) 400 MG tablet Take 1 tablet (400 mg total) by mouth 2 (two) times daily. 60 tablet 5  . amLODipine (NORVASC) 5 MG tablet Take by mouth.    . cyclophosphamide (CYTOXAN) 50 MG capsule Take 10 capsules (500 mg total) by mouth once a week. Take with breakfast. 40 capsule 3  . docusate sodium (COLACE) 100 MG capsule Take 1 capsule by mouth as needed.    Marland Kitchen EPINEPHrine 0.3 mg/0.3 mL IJ SOAJ injection as needed    . insulin glargine (LANTUS SOLOSTAR) 100 UNIT/ML Solostar Pen Inject 10 Units into the skin at bedtime.    . metoprolol succinate (TOPROL-XL) 50 MG 24 hr tablet TAKE 1 TABLET (50 MG TOTAL) BY MOUTH DAILY IN THE MORNING    . mirtazapine (REMERON) 7.5 MG tablet Take 1 tablet (7.5 mg total) by mouth at bedtime. 30 tablet 2  . ondansetron (ZOFRAN) 8 MG tablet Take 8 mg by mouth 30 to 60 min prior to Cytoxan administration then take 8 mg twice daily as needed for nausea and vomiting. 30 tablet 1  . traMADol (ULTRAM) 50 MG tablet Take 1 tablet (50 mg total) by mouth every 6 (six) hours as needed  for severe pain. 8 tablet 0  . albuterol (VENTOLIN HFA) 108 (90 Base) MCG/ACT inhaler Inhale 2 puffs into the lungs every 4 (four) hours as needed. (Patient not taking: No sig reported)    . potassium chloride SA (KLOR-CON) 20 MEQ tablet Take 1 tablet (20 mEq total) by mouth daily. (Patient not taking: No sig reported) 3 tablet 0   No current facility-administered medications for this visit.     PHYSICAL EXAMINATION: ECOG PERFORMANCE STATUS: 1 - Symptomatic but completely ambulatory Vitals:   07/13/20 0946  BP: (!) 146/75  Pulse: 96  Resp: 18  Temp: 98.3 F (36.8 C)   Filed Weights   07/13/20 0946  Weight: 165 lb 3.2 oz (74.9 kg)    Physical Exam Constitutional:      General: She is not in acute distress. HENT:     Head: Normocephalic and atraumatic.  Eyes:     General: No scleral icterus. Cardiovascular:      Rate and Rhythm: Normal rate and regular rhythm.     Heart sounds: Normal heart sounds.  Pulmonary:     Effort: Pulmonary effort is normal. No respiratory distress.     Breath sounds: No wheezing.  Abdominal:     General: Bowel sounds are normal. There is no distension.     Palpations: Abdomen is soft.  Musculoskeletal:        General: Swelling present. No deformity. Normal range of motion.     Cervical back: Normal range of motion and neck supple.     Comments: Bilateral lower extremity 1+ edema  Skin:    General: Skin is warm and dry.     Findings: No erythema or rash.  Neurological:     Mental Status: She is alert and oriented to person, place, and time. Mental status is at baseline.     Cranial Nerves: No cranial nerve deficit.     Coordination: Coordination normal.  Psychiatric:        Mood and Affect: Mood normal.     LABORATORY DATA:  I have reviewed the data as listed Lab Results  Component Value Date   WBC 4.2 07/13/2020   HGB 11.9 (L) 07/13/2020   HCT 34.9 (L) 07/13/2020   MCV 73.6 (L) 07/13/2020   PLT 397 07/13/2020   Recent Labs    09/02/19 1608 03/16/20 1124 06/29/20 0830 07/06/20 0917 07/13/20 0922  NA 138   < > 136 133* 137  K 3.6   < > 3.3* 3.9 3.8  CL 100   < > 106 102 102  CO2 29   < > _0 GLUCOSE 216*   < > 194* 284* 173*  BUN 24*   < > _1 CREATININE 1.15*   < > 1.00 1.08* 1.10*  CALCIUM 9.4   < > 8.6* 8.0* 8.7*  GFRNONAA 47*   < > 58* 53* 52*  GFRAA 54*  --   --   --   --   PROT 8.4*   < > 6.1* 5.6* 5.7*  ALBUMIN 3.9   < > 2.1* 1.7* 1.9*  AST 31   < > 75* 64* 94*  ALT 27   < > 36 36 52*  ALKPHOS 170*   < > 941* 988* 1,196*  BILITOT 1.1   < > 1.7* 1.5* 3.3*   < > = values in this interval not displayed.   Iron/TIBC/Ferritin/ %Sat    Component Value Date/Time   IRON 45 09/02/2019  1608   TIBC 416 09/02/2019 1608   FERRITIN 35 09/02/2019 1608   IRONPCTSAT 11 09/02/2019 1608     08/25/2019, platelet count 491, WBC 7.5,  hemoglobin 12 Creatinine 1.58, EGFR 37, calcium 10.8, albumin 4.2 Negative hepatitis B surface antigen, hepatitis B core antibody, Negative hepatitis C 08/05/2019, A1c 11.2   RADIOGRAPHIC STUDIES: I have personally reviewed the radiological images as listed and agreed with the findings in the report. MR Lumbar Spine W Wo Contrast  Result Date: 05/11/2020 CLINICAL DATA:  bone lesion on MRI abd EXAM: MRI LUMBAR SPINE WITHOUT AND WITH CONTRAST TECHNIQUE: Multiplanar and multiecho pulse sequences of the lumbar spine were obtained without and with intravenous contrast. CONTRAST:  63m GADAVIST GADOBUTROL 1 MMOL/ML IV SOLN COMPARISON:  04/07/2020. FINDINGS: Segmentation:  Standard. Alignment:  Minimal grade 1 L4-5 anterolisthesis. Vertebrae: Vertebral body heights are preserved. Mild Modic type 2 endplate degenerative changes. T1/T2 heterogenous signal involving the entirety of the L1 vertebral body with associated STIR hyperintensity and heterogenous enhancement. No other lesion identified. Conus medullaris and cauda equina: Conus extends to the L2 level. Conus and cauda equina appear normal. Disc levels: Multilevel desiccation. T12-L1: Small central protrusion. Patent spinal canal and neural foramen. L1-2: Minimal disc bulge and bilateral facet degenerative spurring. Patent spinal canal and neural foramen. L2-3: Minimal disc bulge with superimposed bilateral extraforaminal protrusions. Facet degenerative spurring. Patent spinal canal and neural foramen. L3-4: Disc bulge with superimposed left foraminal protrusion. Facet degenerative spurring. Patent spinal canal. Mild bilateral neural foraminal narrowing. L4-5: Uncovered bulge with small superimposed left subarticular protrusion. Bilateral facet hypertrophy and ligamentum flavum thickening. Mild spinal canal and bilateral neural foraminal narrowing. L5-S1: Disc bulge with shallow central protrusion. Bilateral facet degenerative spurring. Patent spinal canal.  Mild bilateral neural foraminal narrowing. Paraspinal and other soft tissues: Negative. 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 If suspicion persists short-term interval follow-up in 8-12 weeks with postcontrast MRI may be considered. Otherwise no aggressive osseous lesion. Multilevel spondylosis as detailed above. Electronically Signed   By: CPrimitivo GauzeM.D.   On: 05/11/2020 14:35   [place]BIOPSY (LIVER)  Result Date: 05/11/2020 INDICATION: Elevated alkaline phosphatase and liver transaminase levels. EXAM: ULTRASOUND GUIDED CORE BIOPSY OF LIVER MEDICATIONS: None. ANESTHESIA/SEDATION: Fentanyl 100 mcg IV; Versed 2.0 mg IV Moderate Sedation Time:  15 minutes. The patient was continuously monitored during the procedure by the interventional radiology nurse under my direct supervision. PROCEDURE: The procedure, risks, benefits, and alternatives were explained to the patient. Questions regarding the procedure were encouraged and answered. The patient understands and consents to the procedure. A time-out was performed prior to initiating the procedure. The liver was localized by ultrasound. The abdominal wall was prepped with chlorhexidine in a sterile fashion, and a sterile drape was applied covering the operative field. A sterile gown and sterile gloves were used for the procedure. Local anesthesia was provided with 1% Lidocaine. A 17 gauge trocar needle was advanced into the right lobe of the liver. Three separate coaxial 18 gauge core biopsy samples were obtained and submitted in formalin. Gel-Foam pledgets were advanced through the outer needle as the needle was retracted and removed. Additional ultrasound was performed. COMPLICATIONS: None immediate. FINDINGS: Solid core biopsy samples were obtained from the liver parenchyma. There were no immediate bleeding complications. IMPRESSION: Ultrasound-guided core biopsy performed of the liver within the right lobe parenchyma.  Electronically Signed   By: GAletta EdouardM.D.   On: 05/11/2020 11:38   CT BONE MARROW BIOPSY &  ASPIRATION  Result Date: 06/09/2020 INDICATION: 75 year old female with a history of monoclonal gammopathy referred for bone marrow biopsy EXAM: CT BONE MARROW BIOPSY AND ASPIRATION MEDICATIONS: None. ANESTHESIA/SEDATION: Moderate (conscious) sedation was employed during this procedure. A total of Versed 1.0 mg and Fentanyl 50 mcg was administered intravenously. Moderate Sedation Time: 10 minutes. The patient's level of consciousness and vital signs were monitored continuously by radiology nursing throughout the procedure under my direct supervision. FLUOROSCOPY TIME:  CT COMPLICATIONS: None PROCEDURE: The procedure risks, benefits, and alternatives were explained to the patient. Questions regarding the procedure were encouraged and answered. The patient understands and consents to the procedure. Scout CT of the pelvis was performed for surgical planning purposes. The right posterior pelvis was prepped with Chlorhexidine in a sterile fashion, and a sterile drape was applied covering the operative field. A sterile gown and sterile gloves were used for the procedure. Local anesthesia was provided with 1% Lidocaine. Right posterior iliac bone was targeted for biopsy. The skin and subcutaneous tissues were infiltrated with 1% lidocaine without epinephrine. A small stab incision was made with an 11 blade scalpel, and an 11 gauge Murphy needle was advanced with CT guidance to the posterior cortex. Manual forced was used to advance the needle through the posterior cortex and the stylet was removed. A bone marrow aspirate was retrieved and passed to a cytotechnologist in the room. The Murphy needle was then advanced without the stylet for a core biopsy. The core biopsy was retrieved and also passed to a cytotechnologist. Manual pressure was used for hemostasis and a sterile dressing was placed. No complications were  encountered no significant blood loss was encountered. Patient tolerated the procedure well and remained hemodynamically stable throughout. IMPRESSION: Status post CT-guided bone marrow biopsy, with tissue specimen sent to pathology for complete histopathologic analysis Signed, Dulcy Fanny. Earleen Newport, DO [facility] Radiology Electronically Signed   By: Corrie Mckusick D.O.   On: 06/09/2020 09:58   ECHOCARDIOGRAM LIMITED  Result Date: 06/01/2020    ECHOCARDIOGRAM LIMITED REPORT   Patient Name:   JULIAH SCADDEN Date of Exam: 06/01/2020 Medical Rec #:  [id number]         Height:       63.5 in Accession #:    [id number]        Weight:       146.5 lb Date of Birth:  1945-04-29          BSA:          1.704 m Patient Age:    70 years          BP:           143/83 mmHg Patient Gender: F                 HR:           84 bpm. Exam Location:  [facility] Procedure: 2D Echo, Cardiac Doppler, Color Doppler and Strain Analysis Indications:     Amyloidosis- unspecified type  History:         Patient has no prior history of Echocardiogram examinations.  Sonographer:     Sherrie Sport RDCS (AE) Referring Phys:  [id number]   Diagnosing Phys: Kate Sable MD  Sonographer Comments: Global longitudinal strain was attempted. IMPRESSIONS  1. Left ventricular ejection fraction, by estimation, is 60 to 65%. The left ventricle has normal function. There is mild asymmetric left ventricular hypertrophy of the basal-septal segment. Left ventricular diastolic parameters are  indeterminate. The average left ventricular global longitudinal strain is -16.8 %.  2. Right ventricular systolic function is normal.  3. The mitral valve is normal in structure. Mild mitral valve regurgitation.  4. The aortic valve is tricuspid. Aortic valve regurgitation is not visualized. Mild aortic valve sclerosis is present, with no evidence of aortic valve stenosis.  5. The inferior vena cava is normal in size with greater  than 50% respiratory variability, suggesting right atrial pressure of 3 mmHg. Conclusion(s)/Recommendation(s): No echocardiographic findings to suggest cardiac amyloid on this study. FINDINGS  Left Ventricle: Left ventricular ejection fraction, by estimation, is 60 to 65%. The left ventricle has normal function. The average left ventricular global longitudinal strain is -16.8 %. The left ventricular internal cavity size was normal in size. There is mild asymmetric left ventricular hypertrophy of the basal-septal segment. Left ventricular diastolic parameters are indeterminate. Right Ventricle: No increase in right ventricular wall thickness. Right ventricular systolic function is normal. Left Atrium: Left atrial size was normal in size. Right Atrium: Right atrial size was normal in size. Pericardium: There is no evidence of pericardial effusion. Mitral Valve: The mitral valve is normal in structure. Mild mitral valve regurgitation. Tricuspid Valve: The tricuspid valve is normal in structure. Tricuspid valve regurgitation is not demonstrated. Aortic Valve: The aortic valve is tricuspid. Aortic valve regurgitation is not visualized. Mild aortic valve sclerosis is present, with no evidence of aortic valve stenosis. Aortic valve mean gradient measures 5.0 mmHg. Aortic valve peak gradient measures 8.2 mmHg. Aortic valve area, by VTI measures 1.58 cm. Pulmonic Valve: The pulmonic valve was normal in structure. Pulmonic valve regurgitation is trivial. Aorta: The aortic root is normal in size and structure. Venous: The inferior vena cava is normal in size with greater than 50% respiratory variability, suggesting right atrial pressure of 3 mmHg. LEFT VENTRICLE PLAX 2D LVIDd:         4.28 cm  Diastology LVIDs:         2.44 cm  LV e' medial:    5.66 cm/s LV PW:         1.72 cm  LV E/e' medial:  13.6 LV IVS:        1.01 cm  LV e' lateral:   5.11 cm/s LVOT diam:     2.00 cm  LV E/e' lateral: 15.1 LV SV:         49 LV SV Index:    29       2D Longitudinal Strain LVOT Area:     3.14 cm 2D Strain GLS Avg:     -16.8 %                          3D Volume EF:                         3D EF:        62 %                         LV EDV:       115 ml                         LV ESV:       44 ml  LV SV:        71 ml RIGHT VENTRICLE RV Basal diam:  3.11 cm RV S prime:     10.10 cm/s TAPSE (M-mode): 3.1 cm LEFT ATRIUM             Index       RIGHT ATRIUM           Index LA diam:        3.80 cm 2.23 cm/m  RA Area:     13.60 cm LA Vol (A2C):   47.7 ml 27.99 ml/m RA Volume:   32.60 ml  19.13 ml/m LA Vol (A4C):   46.4 ml 27.23 ml/m LA Biplane Vol: 49.3 ml 28.93 ml/m  AORTIC VALVE                    PULMONIC VALVE AV Area (Vmax):    1.62 cm     PV Vmax:        0.65 m/s AV Area (Vmean):   1.56 cm     PV Peak grad:   1.7 mmHg AV Area (VTI):     1.58 cm     RVOT Peak grad: 2 mmHg AV Vmax:           143.00 cm/s AV Vmean:          104.667 cm/s AV VTI:            0.309 m AV Peak Grad:      8.2 mmHg AV Mean Grad:      5.0 mmHg LVOT Vmax:         73.80 cm/s LVOT Vmean:        52.000 cm/s LVOT VTI:          0.156 m LVOT/AV VTI ratio: 0.50  AORTA Ao Root diam: 3.10 cm MITRAL VALVE               TRICUSPID VALVE MV Area (PHT): 4.54 cm    TR Peak grad:   11.2 mmHg MV Decel Time: 167 msec    TR Vmax:        167.00 cm/s MV E velocity: 77.10 cm/s MV A velocity: 72.00 cm/s  SHUNTS MV E/A ratio:  1.07        Systemic VTI:  0.16 m                            Systemic Diam: 2.00 cm Kate Sable MD Electronically signed by Kate Sable MD Signature Date/Time: 06/01/2020/4:06:38 PM    Final       ASSESSMENT & PLAN:  1. Light chain (AL) amyloidosis (HCC)   2. Encounter for antineoplastic chemotherapy   3. 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[place])    # AL-amyloidosis Diagnosis of AL amyloidosis was discussed with patient.  Patient has biopsy confirmed liver involvement, possible kidney involvement- Labs reviewed and discussed  with patient. Proceed with cycle 1 day 15 Dara-CyBorD. Transaminitis, hyperbilirubinemia.  Liver function is worsening. This is due to the liver involvement of the AL amyloidosis, I discussed with patient, daughter, and son that current treatment is to suppress the clone hopefully decrease further abnormal protein accumulation.  However the current organ damage may or may not be reversed by treatments. Bilirubinemia now has increased to 3.3, alkaline phosphatase over 1000.  Decrease Velcade to 0.7 mg/m. She has taken today's cyclophosphamide 500 mg.  In the future I may decrease cyclophosphamide to 75%  dose if bilirubin continues to be above 3.1. Pending second opinion at [facility]. I discussed with [facility] gastroenterology who will have patient for follow-up for further discussion and question about her worsening liver function.   #Hypokalemia, potassium level 3.8.  #Chronic kidney disease, follow-up with nephrology.  Kidney function stable.  Supportive care measures are necessary for patient well-being and will be provided as necessary. We spent sufficient time to discuss many aspect of care, questions were answered to patient's satisfaction.  All questions were answered. The patient knows to call the clinic with any problems questions or concerns.  cc Leonel Ramsay, MD   [name], MD, PhD Hematology Oncology [facility] Pager- [phone] 07/13/2020

## 2020-07-13 NOTE — Progress Notes (Signed)
Patient reports new skin burning sensation in the mornings.  She had a vomiting episode after eating when she got home after last treatment.

## 2020-07-14 NOTE — Telephone Encounter (Signed)
Patient and son calling in wanting to have Dr. Saunders Revel expedite the order for her MRI. Patient stated they called and spoke to [facility] and the office stated if he were to call and request her scan to be expedited they would be able to help. Patient is now scheduled for her MRI 5/19  Please advise

## 2020-07-14 NOTE — Telephone Encounter (Signed)
I can call [facility] next week when I am back on the office to inquire about scheduling the test earlier, though the test does not need to be done on an emergent bases given recent echo with normal LVEF and GLS as well as lack of active cardiac symptoms.  Nelva Bush, MD [facility]

## 2020-07-18 ENCOUNTER — Other Ambulatory Visit (HOSPITAL_COMMUNITY): Payer: Self-pay

## 2020-07-19 NOTE — Telephone Encounter (Signed)
Spoke with patient and her son about MRI. Advised that Dr. Saunders Revel has been out of the office but he would try to call [facility] to see if this test can be done earlier. Advised that this test does not need to be done on urgent basis given her normal echo results. Advised that if she has not heard anything in roughly a week to give [place] a call back. Also confirmed upcoming appointment with Dr. Saunders Revel. She verbalized understanding of our conversation, agreement with plan, and had no further questions at this time.

## 2020-07-20 ENCOUNTER — Other Ambulatory Visit (HOSPITAL_COMMUNITY): Payer: Self-pay

## 2020-07-20 ENCOUNTER — Encounter: Payer: Medicare Other | Admitting: Licensed Clinical Social Worker

## 2020-07-20 ENCOUNTER — Other Ambulatory Visit: Payer: Medicare Other

## 2020-07-20 ENCOUNTER — Inpatient Hospital Stay (HOSPITAL_BASED_OUTPATIENT_CLINIC_OR_DEPARTMENT_OTHER): Payer: Medicare Other | Admitting: Oncology

## 2020-07-20 ENCOUNTER — Other Ambulatory Visit: Payer: Self-pay

## 2020-07-20 ENCOUNTER — Inpatient Hospital Stay: Payer: Medicare Other | Attending: Oncology

## 2020-07-20 ENCOUNTER — Inpatient Hospital Stay: Payer: Medicare Other

## 2020-07-20 ENCOUNTER — Encounter: Payer: Self-pay | Admitting: Oncology

## 2020-07-20 ENCOUNTER — Other Ambulatory Visit: Payer: Self-pay | Admitting: Oncology

## 2020-07-20 VITALS — BP 168/75 | HR 78 | Temp 97.8°F | Wt 165.1 lb

## 2020-07-20 DIAGNOSIS — Z79899 Other long term (current) drug therapy: Secondary | ICD-10-CM | POA: Insufficient documentation

## 2020-07-20 DIAGNOSIS — Z7189 Other specified counseling: Secondary | ICD-10-CM

## 2020-07-20 DIAGNOSIS — R112 Nausea with vomiting, unspecified: Secondary | ICD-10-CM

## 2020-07-20 DIAGNOSIS — E859 Amyloidosis, unspecified: Secondary | ICD-10-CM

## 2020-07-20 DIAGNOSIS — D631 Anemia in chronic kidney disease: Secondary | ICD-10-CM | POA: Diagnosis not present

## 2020-07-20 DIAGNOSIS — E8581 Light chain (AL) amyloidosis: Secondary | ICD-10-CM

## 2020-07-20 DIAGNOSIS — N1831 Chronic kidney disease, stage 3a: Secondary | ICD-10-CM | POA: Diagnosis not present

## 2020-07-20 DIAGNOSIS — M47815 Spondylosis without myelopathy or radiculopathy, thoracolumbar region: Secondary | ICD-10-CM | POA: Insufficient documentation

## 2020-07-20 DIAGNOSIS — Z5112 Encounter for antineoplastic immunotherapy: Secondary | ICD-10-CM | POA: Insufficient documentation

## 2020-07-20 DIAGNOSIS — E876 Hypokalemia: Secondary | ICD-10-CM | POA: Diagnosis not present

## 2020-07-20 DIAGNOSIS — R748 Abnormal levels of other serum enzymes: Secondary | ICD-10-CM | POA: Diagnosis not present

## 2020-07-20 DIAGNOSIS — T451X5A Adverse effect of antineoplastic and immunosuppressive drugs, initial encounter: Secondary | ICD-10-CM | POA: Insufficient documentation

## 2020-07-20 DIAGNOSIS — M1990 Unspecified osteoarthritis, unspecified site: Secondary | ICD-10-CM | POA: Diagnosis not present

## 2020-07-20 DIAGNOSIS — N1832 Chronic kidney disease, stage 3b: Secondary | ICD-10-CM | POA: Insufficient documentation

## 2020-07-20 DIAGNOSIS — R7401 Elevation of levels of liver transaminase levels: Secondary | ICD-10-CM

## 2020-07-20 DIAGNOSIS — Z5111 Encounter for antineoplastic chemotherapy: Secondary | ICD-10-CM

## 2020-07-20 DIAGNOSIS — D472 Monoclonal gammopathy: Secondary | ICD-10-CM | POA: Diagnosis not present

## 2020-07-20 LAB — CBC WITH DIFFERENTIAL/PLATELET
Abs Immature Granulocytes: 0.02 10*3/uL (ref 0.00–0.07)
Basophils Absolute: 0 10*3/uL (ref 0.0–0.1)
Basophils Relative: 1 %
Eosinophils Absolute: 0 10*3/uL (ref 0.0–0.5)
Eosinophils Relative: 1 %
HCT: 28.6 % — ABNORMAL LOW (ref 36.0–46.0)
Hemoglobin: 9.8 g/dL — ABNORMAL LOW (ref 12.0–15.0)
Immature Granulocytes: 1 %
Lymphocytes Relative: 27 %
Lymphs Abs: 0.9 10*3/uL (ref 0.7–4.0)
MCH: 25.3 pg — ABNORMAL LOW (ref 26.0–34.0)
MCHC: 34.3 g/dL (ref 30.0–36.0)
MCV: 73.9 fL — ABNORMAL LOW (ref 80.0–100.0)
Monocytes Absolute: 0.4 10*3/uL (ref 0.1–1.0)
Monocytes Relative: 11 %
Neutro Abs: 2.1 10*3/uL (ref 1.7–7.7)
Neutrophils Relative %: 59 %
Platelets: 451 10*3/uL — ABNORMAL HIGH (ref 150–400)
RBC: 3.87 MIL/uL (ref 3.87–5.11)
RDW: 22.5 % — ABNORMAL HIGH (ref 11.5–15.5)
WBC: 3.4 10*3/uL — ABNORMAL LOW (ref 4.0–10.5)
nRBC: 0.9 % — ABNORMAL HIGH (ref 0.0–0.2)

## 2020-07-20 LAB — COMPREHENSIVE METABOLIC PANEL
ALT: 45 U/L — ABNORMAL HIGH (ref 0–44)
AST: 72 U/L — ABNORMAL HIGH (ref 15–41)
Albumin: 1.9 g/dL — ABNORMAL LOW (ref 3.5–5.0)
Alkaline Phosphatase: 991 U/L — ABNORMAL HIGH (ref 38–126)
Anion gap: 7 (ref 5–15)
BUN: 14 mg/dL (ref 8–23)
CO2: 27 mmol/L (ref 22–32)
Calcium: 8.3 mg/dL — ABNORMAL LOW (ref 8.9–10.3)
Chloride: 103 mmol/L (ref 98–111)
Creatinine, Ser: 0.93 mg/dL (ref 0.44–1.00)
GFR, Estimated: >60 mL/min (ref >60)
Glucose, Bld: 159 mg/dL — ABNORMAL HIGH (ref 70–99)
Potassium: 3.4 mmol/L — ABNORMAL LOW (ref 3.5–5.1)
Sodium: 137 mmol/L (ref 135–145)
Total Bilirubin: 2.1 mg/dL — ABNORMAL HIGH (ref 0.3–1.2)
Total Protein: 5.3 g/dL — ABNORMAL LOW (ref 6.5–8.1)

## 2020-07-20 MED ORDER — DEXAMETHASONE 4 MG PO TABS
40.0000 mg | ORAL_TABLET | Freq: Once | ORAL | Status: AC
  Administered 2020-07-20: 40 mg via ORAL
  Filled 2020-07-20: qty 10

## 2020-07-20 MED ORDER — BORTEZOMIB CHEMO SQ INJECTION 3.5 MG (2.5MG/ML)
0.7000 mg/m2 | Freq: Once | INTRAMUSCULAR | Status: AC
  Administered 2020-07-20: 1.25 mg via SUBCUTANEOUS
  Filled 2020-07-20: qty 0.5

## 2020-07-20 MED ORDER — POTASSIUM CHLORIDE ER 10 MEQ PO TBCR: 10.0000 meq | EXTENDED_RELEASE_TABLET | Freq: Every day | ORAL | 0 refills | Status: DC

## 2020-07-20 MED ORDER — PROCHLORPERAZINE MALEATE 10 MG PO TABS: 10.0000 mg | ORAL_TABLET | Freq: Four times a day (QID) | ORAL | 1 refills | Status: DC | PRN

## 2020-07-20 MED ORDER — DARATUMUMAB-HYALURONIDASE-FIHJ 1800-30000 MG-UT/15ML ~~LOC~~ SOLN
1800.0000 mg | Freq: Once | SUBCUTANEOUS | Status: AC
  Administered 2020-07-20: 1800 mg via SUBCUTANEOUS
  Filled 2020-07-20: qty 15

## 2020-07-20 MED ORDER — DIPHENHYDRAMINE HCL 25 MG PO CAPS
50.0000 mg | ORAL_CAPSULE | Freq: Once | ORAL | Status: AC
  Administered 2020-07-20: 50 mg via ORAL
  Filled 2020-07-20: qty 2

## 2020-07-20 MED ORDER — ACETAMINOPHEN 325 MG PO TABS
650.0000 mg | ORAL_TABLET | Freq: Once | ORAL | Status: AC
  Administered 2020-07-20: 650 mg via ORAL
  Filled 2020-07-20: qty 2

## 2020-07-20 MED ORDER — LORAZEPAM 0.5 MG PO TABS: 0.5000 mg | ORAL_TABLET | Freq: Four times a day (QID) | ORAL | 0 refills | Status: DC | PRN

## 2020-07-20 NOTE — Progress Notes (Addendum)
Hematology/Oncology  Follow up note [facility] [phone] Fax:([phone]   Patient Care Team: Leonel Ramsay, MD as PCP - General (Infectious Diseases)  REFERRING PROVIDER: Leonel Ramsay, MD  CHIEF COMPLAINTS/REASON FOR VISIT:  Follow-up for amyloidosis  HISTORY OF PRESENTING ILLNESS:   Nancy Rodriguez is a  75 y.o.  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  Work up include protein electrophoresis showed M protein 0.7,   Reviewed her previous medical records via care everywhere.  She was seen by Hematology Oncology at [facility] on 02/14/2017.  07/25/2007  SPEP showed M protein of 0.35, IFE showed IgG lamda 09/22/2007  Bone survey negative.  02/15/14 IgG 930, SPEP M protien 0.22, free kappa light chain 3.59, lamda 2.92, ratio 1.23  Hypercalcemia, resolved after stopping HCTZ.  # Transaminitis 03/28/20 US abdomen showed left liver lobe Complex 1.7 cm cyst  # 04/07/20 MRI abdomen w/wo contrast showed 2 benign liver cysts, 2 nonspecific hypovascular irighr liver lesions, abnormal bone marrow signal of L1 vertebral body. Patient was  advised to proceed with bone marrow biopsy and she declined.  # referred her to established care with GI and was seen on 04/19/20,  # Liver biopsy showed amyloidosis. [facility] MS/MS showed AL type # 05/20/20 recommend bone marrow biopsy which is scheduled on 05/24/2020.  Patient changed her mind and ask a biopsy to be canceled.  My team and I had multiple phone discussion with patient's daughter [name] and later with [name].  Patient agreed with bone marrow biopsy and a biopsy was scheduled on 06/09/2020.  05/20/20 NT proBNP  was normal,  TSH slightly increased, normal T4 Normal factor X Normal coags Troponin 19. Refer to  cardiology for evaluation. May need cardiac MRI  # 06/01/2020 2D echo result was reviewed and discussed with patient.  Patient has normal LVEF 60-65%. Mild asymmetric left ventricular hypertrophy.  Left ventricular diastolic parameters are indeterminate. average left ventricular global longitudinal strain is -16.8 %.  # 06/09/2020, bone marrow biopsy showed monoclonal plasmacytosis, 8%, amyloid deposit present.  Absent iron stores. 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 t (14;20) #06/17/2020, further discussed about diagnosis and treatment plan.  Patient declined bone marrow transplant evaluation.  Decision was made to proceed with Dara-CyborD chemotherapy treatments.  INTERVAL HISTORY Nancy Rodriguez is a 75 y.o. female who has above history reviewed by me today presents for follow up visit for management of AL amyloidosis Problems and complaints are listed below: Patient presents for the evaluation prior to Dara-CyborD treatment. She was accompanied by her daughter.   Patient has been referred to establish care with [facility] for second opinion. Patient reports that she felt nauseated 3 AM today after chemotherapy and vomited.  She took antiemetics a few hours prior but still feels nauseated. Otherwise no new complaints. Continues to have itchy skin. .  Review of Systems  Constitutional: Negative for appetite change, chills, fatigue and fever.  HENT:   Negative for hearing loss, nosebleeds and voice change.   Eyes: Negative for eye problems.  Respiratory: Negative for chest tightness and cough.   Cardiovascular: Positive for leg swelling. Negative for chest pain.  Gastrointestinal: Positive for nausea. Negative for abdominal distention, abdominal pain and blood in stool.  Endocrine:  Negative for hot flashes.  Genitourinary: Negative for difficulty urinating and frequency.   Musculoskeletal: Negative for arthralgias.  Skin: Positive for  itching. Negative for rash.  Neurological: Positive for numbness. Negative for extremity weakness.  Hematological: Negative for adenopathy.  Psychiatric/Behavioral: Negative for confusion.    MEDICAL HISTORY:  Past Medical History:  Diagnosis Date  . Anemia    Anemia in chronic kidney disease  . Chronic kidney disease    Stage 3b chronic kidney disease  . Diabetes mellitus without complication ([place])   . Hypercholesterolemia   . Hypertension   . MGUS (monoclonal gammopathy of unknown significance)   . Osteoarthritis   . Rheumatoid arthritis ([place])     SURGICAL HISTORY: Past Surgical History:  Procedure Laterality Date  . HERNIA REPAIR    . PARATHYROIDECTOMY      SOCIAL HISTORY: Social History   Socioeconomic History  . Marital status: Widowed    Spouse name: Not on file  . Number of children: 6  . Years of education: Not on file  . Highest education level: Not on file  Occupational History  . Not on file  Tobacco Use  . Smoking status: Never Smoker  . Smokeless tobacco: Never Used  Substance and Sexual Activity  . Alcohol use: No  . Drug use: Never  . Sexual activity: Not on file  Other Topics Concern  . Not on file  Social History Narrative  . Not on file   Social Determinants of Health   Financial Resource Strain: Not on file  Food Insecurity: Not on file  Transportation Needs: Not on file  Physical Activity: Not on file  Stress: Not on file  Social Connections: Not on file  Intimate Partner Violence: Not on file    FAMILY HISTORY: Family History  Problem Relation Age of Onset  . Diabetes Daughter   . Diabetes Son   . Dementia Mother   . Cancer Father   . Esophageal cancer Sister   . Brain cancer Brother     ALLERGIES:  is allergic to benazepril, lisinopril, tolmetin, and nsaids.  MEDICATIONS:  Current Outpatient Medications  Medication Sig Dispense Refill  . acyclovir (ZOVIRAX) 400 MG tablet Take 1 tablet (400 mg total) by mouth 2 (two)  times daily. 60 tablet 5  . amLODipine (NORVASC) 5 MG tablet Take by mouth.    . cyclophosphamide (CYTOXAN) 50 MG capsule TAKE 10 CAPSULES (500 MG TOTAL) BY MOUTH ONCE A WEEK. TAKE WITH BREAKFAST. 40 capsule 3  . docusate sodium (COLACE) 100 MG capsule Take 1 capsule by mouth as needed.    Marland Kitchen EPINEPHrine 0.3 mg/0.3 mL IJ SOAJ injection as needed    . insulin glargine (LANTUS SOLOSTAR) 100 UNIT/ML Solostar Pen Inject 10 Units into the skin at bedtime.    Marland Kitchen LORazepam (ATIVAN) 0.5 MG tablet Take 1 tablet (0.5 mg total) by mouth every 6 (six) hours as needed (nausea vomiting). 30 tablet 0  . metoprolol succinate (TOPROL-XL) 50 MG 24 hr tablet TAKE 1 TABLET (50 MG TOTAL) BY MOUTH DAILY IN THE MORNING    . mirtazapine (REMERON) 7.5 MG tablet Take 1 tablet (7.5 mg total) by mouth at bedtime. 30 tablet 2  . ondansetron (ZOFRAN) 8 MG tablet Take 8 mg by mouth 30 to 60 min prior to Cytoxan administration then take 8 mg twice daily as needed for nausea and vomiting. 30 tablet 1  . prochlorperazine (COMPAZINE) 10 MG tablet Take 1 tablet (10 mg total) by mouth every 6 (six)  hours as needed for nausea or vomiting. 60 tablet 1  . traMADol (ULTRAM) 50 MG tablet Take 1 tablet (50 mg total) by mouth every 6 (six) hours as needed for severe pain. 8 tablet 0  . albuterol (VENTOLIN HFA) 108 (90 Base) MCG/ACT inhaler Inhale 2 puffs into the lungs every 4 (four) hours as needed. (Patient not taking: No sig reported)    . potassium chloride SA (KLOR-CON) 20 MEQ tablet Take 1 tablet (20 mEq total) by mouth daily. (Patient not taking: No sig reported) 3 tablet 0   No current facility-administered medications for this visit.     PHYSICAL EXAMINATION: ECOG PERFORMANCE STATUS: 1 - Symptomatic but completely ambulatory Vitals:   07/20/20 1030  BP: (!) 168/75  Pulse: 78  Temp: 97.8 F (36.6 C)  SpO2: 95%   Filed Weights   07/20/20 1030  Weight: 165 lb 1.6 oz (74.9 kg)    Physical Exam Constitutional:       General: She is not in acute distress. HENT:     Head: Normocephalic and atraumatic.  Eyes:     General: No scleral icterus. Cardiovascular:     Rate and Rhythm: Normal rate and regular rhythm.     Heart sounds: Normal heart sounds.  Pulmonary:     Effort: Pulmonary effort is normal. No respiratory distress.     Breath sounds: No wheezing.  Abdominal:     General: Bowel sounds are normal. There is no distension.     Palpations: Abdomen is soft.  Musculoskeletal:        General: Swelling present. No deformity. Normal range of motion.     Cervical back: Normal range of motion and neck supple.     Comments: Bilateral lower extremity 1+ edema  Skin:    General: Skin is warm and dry.     Findings: No erythema or rash.  Neurological:     Mental Status: She is alert and oriented to person, place, and time. Mental status is at baseline.     Cranial Nerves: No cranial nerve deficit.     Coordination: Coordination normal.  Psychiatric:        Mood and Affect: Mood normal.     LABORATORY DATA:  I have reviewed the data as listed Lab Results  Component Value Date   WBC 3.4 (L) 07/20/2020   HGB 9.8 (L) 07/20/2020   HCT 28.6 (L) 07/20/2020   MCV 73.9 (L) 07/20/2020   PLT 451 (H) 07/20/2020   Recent Labs    09/02/19 1608 03/16/20 1124 07/06/20 0917 07/13/20 0922 07/20/20 0914  NA 138   < > 133* 137 137  K 3.6   < > 3.9 3.8 3.4*  CL 100   < > 102 102 103  CO2 29   < > _0 GLUCOSE 216*   < > 284* 173* 159*  BUN 24*   < > _1 CREATININE 1.15*   < > 1.08* 1.10* 0.93  CALCIUM 9.4   < > 8.0* 8.7* 8.3*  GFRNONAA 47*   < > 53* 52* >60  GFRAA 54*  --   --   --   --   PROT 8.4*   < > 5.6* 5.7* 5.3*  ALBUMIN 3.9   < > 1.7* 1.9* 1.9*  AST 31   < > 64* 94* 72*  ALT 27   < > 36 52* 45*  ALKPHOS 170*   < > 988* 1,196* 991*  BILITOT 1.1   < >  1.5* 3.3* 2.1*   < > = values in this interval not displayed.   Iron/TIBC/Ferritin/ %Sat    Component Value Date/Time   IRON 45  09/02/2019 1608   TIBC 416 09/02/2019 1608   FERRITIN 35 09/02/2019 1608   IRONPCTSAT 11 09/02/2019 1608     08/25/2019, platelet count 491, WBC 7.5, hemoglobin 12 Creatinine 1.58, EGFR 37, calcium 10.8, albumin 4.2 Negative hepatitis B surface antigen, hepatitis B core antibody, Negative hepatitis C 08/05/2019, A1c 11.2   RADIOGRAPHIC STUDIES: I have personally reviewed the radiological images as listed and agreed with the findings in the report. MR Lumbar Spine W Wo Contrast  Result Date: 05/11/2020 CLINICAL DATA:  bone lesion on MRI abd EXAM: MRI LUMBAR SPINE WITHOUT AND WITH CONTRAST TECHNIQUE: Multiplanar and multiecho pulse sequences of the lumbar spine were obtained without and with intravenous contrast. CONTRAST:  25m GADAVIST GADOBUTROL 1 MMOL/ML IV SOLN COMPARISON:  04/07/2020. FINDINGS: Segmentation:  Standard. Alignment:  Minimal grade 1 L4-5 anterolisthesis. Vertebrae: Vertebral body heights are preserved. Mild Modic type 2 endplate degenerative changes. T1/T2 heterogenous signal involving the entirety of the L1 vertebral body with associated STIR hyperintensity and heterogenous enhancement. No other lesion identified. Conus medullaris and cauda equina: Conus extends to the L2 level. Conus and cauda equina appear normal. Disc levels: Multilevel desiccation. T12-L1: Small central protrusion. Patent spinal canal and neural foramen. L1-2: Minimal disc bulge and bilateral facet degenerative spurring. Patent spinal canal and neural foramen. L2-3: Minimal disc bulge with superimposed bilateral extraforaminal protrusions. Facet degenerative spurring. Patent spinal canal and neural foramen. L3-4: Disc bulge with superimposed left foraminal protrusion. Facet degenerative spurring. Patent spinal canal. Mild bilateral neural foraminal narrowing. L4-5: Uncovered bulge with small superimposed left subarticular protrusion. Bilateral facet hypertrophy and ligamentum flavum thickening. Mild spinal canal  and bilateral neural foraminal narrowing. L5-S1: Disc bulge with shallow central protrusion. Bilateral facet degenerative spurring. Patent spinal canal. Mild bilateral neural foraminal narrowing. Paraspinal and other soft tissues: Negative. 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 If suspicion persists short-term interval follow-up in 8-12 weeks with postcontrast MRI may be considered. Otherwise no aggressive osseous lesion. Multilevel spondylosis as detailed above. Electronically Signed   By: CPrimitivo GauzeM.D.   On: 05/11/2020 14:35   UK[place]BIOPSY (LIVER)  Result Date: 05/11/2020 INDICATION: Elevated alkaline phosphatase and liver transaminase levels. EXAM: ULTRASOUND GUIDED CORE BIOPSY OF LIVER MEDICATIONS: None. ANESTHESIA/SEDATION: Fentanyl 100 mcg IV; Versed 2.0 mg IV Moderate Sedation Time:  15 minutes. The patient was continuously monitored during the procedure by the interventional radiology nurse under my direct supervision. PROCEDURE: The procedure, risks, benefits, and alternatives were explained to the patient. Questions regarding the procedure were encouraged and answered. The patient understands and consents to the procedure. A time-out was performed prior to initiating the procedure. The liver was localized by ultrasound. The abdominal wall was prepped with chlorhexidine in a sterile fashion, and a sterile drape was applied covering the operative field. A sterile gown and sterile gloves were used for the procedure. Local anesthesia was provided with 1% Lidocaine. A 17 gauge trocar needle was advanced into the right lobe of the liver. Three separate coaxial 18 gauge core biopsy samples were obtained and submitted in formalin. Gel-Foam pledgets were advanced through the outer needle as the needle was retracted and removed. Additional ultrasound was performed. COMPLICATIONS: None immediate. FINDINGS: Solid core biopsy samples were obtained from the liver  parenchyma. There were no immediate bleeding complications. IMPRESSION: Ultrasound-guided core biopsy  performed of the liver within the right lobe parenchyma. Electronically Signed   By: Aletta Edouard M.D.   On: 05/11/2020 11:38   CT BONE MARROW BIOPSY & ASPIRATION  Result Date: 06/09/2020 INDICATION: 75 year old female with a history of monoclonal gammopathy referred for bone marrow biopsy EXAM: CT BONE MARROW BIOPSY AND ASPIRATION MEDICATIONS: None. ANESTHESIA/SEDATION: Moderate (conscious) sedation was employed during this procedure. A total of Versed 1.0 mg and Fentanyl 50 mcg was administered intravenously. Moderate Sedation Time: 10 minutes. The patient's level of consciousness and vital signs were monitored continuously by radiology nursing throughout the procedure under my direct supervision. FLUOROSCOPY TIME:  CT COMPLICATIONS: None PROCEDURE: The procedure risks, benefits, and alternatives were explained to the patient. Questions regarding the procedure were encouraged and answered. The patient understands and consents to the procedure. Scout CT of the pelvis was performed for surgical planning purposes. The right posterior pelvis was prepped with Chlorhexidine in a sterile fashion, and a sterile drape was applied covering the operative field. A sterile gown and sterile gloves were used for the procedure. Local anesthesia was provided with 1% Lidocaine. Right posterior iliac bone was targeted for biopsy. The skin and subcutaneous tissues were infiltrated with 1% lidocaine without epinephrine. A small stab incision was made with an 11 blade scalpel, and an 11 gauge Murphy needle was advanced with CT guidance to the posterior cortex. Manual forced was used to advance the needle through the posterior cortex and the stylet was removed. A bone marrow aspirate was retrieved and passed to a cytotechnologist in the room. The Murphy needle was then advanced without the stylet for a core biopsy. The core  biopsy was retrieved and also passed to a cytotechnologist. Manual pressure was used for hemostasis and a sterile dressing was placed. No complications were encountered no significant blood loss was encountered. Patient tolerated the procedure well and remained hemodynamically stable throughout. IMPRESSION: Status post CT-guided bone marrow biopsy, with tissue specimen sent to pathology for complete histopathologic analysis Signed, Dulcy Fanny. Earleen Newport, DO [facility] Electronically Signed   By: Corrie Mckusick D.O.   On: 06/09/2020 09:58   ECHOCARDIOGRAM LIMITED  Result Date: 06/01/2020    ECHOCARDIOGRAM LIMITED REPORT   Patient Name:   PERLINE AWE Date of Exam: 06/01/2020 Medical Rec #:  [id number]         Height:       63.5 in Accession #:    [id number]        Weight:       146.5 lb Date of Birth:  Apr 04, 1946          BSA:          1.704 m Patient Age:    1 years          BP:           143/83 mmHg Patient Gender: F                 HR:           84 bpm. Exam Location:  [facility] Procedure: 2D Echo, Cardiac Doppler, Color Doppler and Strain Analysis Indications:     Amyloidosis- unspecified type  History:         Patient has no prior history of Echocardiogram examinations.  Sonographer:     Sherrie Sport RDCS (AE) Referring Phys:  [id number] Gionna Polak Diagnosing Phys: Kate Sable MD  Sonographer Comments: Global longitudinal strain was attempted. IMPRESSIONS  1. Left ventricular  ejection fraction, by estimation, is 60 to 65%. The left ventricle has normal function. There is mild asymmetric left ventricular hypertrophy of the basal-septal segment. Left ventricular diastolic parameters are indeterminate. The average left ventricular global longitudinal strain is -16.8 %.  2. Right ventricular systolic function is normal.  3. The mitral valve is normal in structure. Mild mitral valve regurgitation.  4. The aortic valve is tricuspid. Aortic valve regurgitation is not  visualized. Mild aortic valve sclerosis is present, with no evidence of aortic valve stenosis.  5. The inferior vena cava is normal in size with greater than 50% respiratory variability, suggesting right atrial pressure of 3 mmHg. Conclusion(s)/Recommendation(s): No echocardiographic findings to suggest cardiac amyloid on this study. FINDINGS  Left Ventricle: Left ventricular ejection fraction, by estimation, is 60 to 65%. The left ventricle has normal function. The average left ventricular global longitudinal strain is -16.8 %. The left ventricular internal cavity size was normal in size. There is mild asymmetric left ventricular hypertrophy of the basal-septal segment. Left ventricular diastolic parameters are indeterminate. Right Ventricle: No increase in right ventricular wall thickness. Right ventricular systolic function is normal. Left Atrium: Left atrial size was normal in size. Right Atrium: Right atrial size was normal in size. Pericardium: There is no evidence of pericardial effusion. Mitral Valve: The mitral valve is normal in structure. Mild mitral valve regurgitation. Tricuspid Valve: The tricuspid valve is normal in structure. Tricuspid valve regurgitation is not demonstrated. Aortic Valve: The aortic valve is tricuspid. Aortic valve regurgitation is not visualized. Mild aortic valve sclerosis is present, with no evidence of aortic valve stenosis. Aortic valve mean gradient measures 5.0 mmHg. Aortic valve peak gradient measures 8.2 mmHg. Aortic valve area, by VTI measures 1.58 cm. Pulmonic Valve: The pulmonic valve was normal in structure. Pulmonic valve regurgitation is trivial. Aorta: The aortic root is normal in size and structure. Venous: The inferior vena cava is normal in size with greater than 50% respiratory variability, suggesting right atrial pressure of 3 mmHg. LEFT VENTRICLE PLAX 2D LVIDd:         4.28 cm  Diastology LVIDs:         2.44 cm  LV e' medial:    5.66 cm/s LV PW:         1.72  cm  LV E/e' medial:  13.6 LV IVS:        1.01 cm  LV e' lateral:   5.11 cm/s LVOT diam:     2.00 cm  LV E/e' lateral: 15.1 LV SV:         49 LV SV Index:   29       2D Longitudinal Strain LVOT Area:     3.14 cm 2D Strain GLS Avg:     -16.8 %                          3D Volume EF:                         3D EF:        62 %                         LV EDV:       115 ml                         LV ESV:  44 ml                         LV SV:        71 ml RIGHT VENTRICLE RV Basal diam:  3.11 cm RV S prime:     10.10 cm/s TAPSE (M-mode): 3.1 cm LEFT ATRIUM             Index       RIGHT ATRIUM           Index LA diam:        3.80 cm 2.23 cm/m  RA Area:     13.60 cm LA Vol (A2C):   47.7 ml 27.99 ml/m RA Volume:   32.60 ml  19.13 ml/m LA Vol (A4C):   46.4 ml 27.23 ml/m LA Biplane Vol: 49.3 ml 28.93 ml/m  AORTIC VALVE                    PULMONIC VALVE AV Area (Vmax):    1.62 cm     PV Vmax:        0.65 m/s AV Area (Vmean):   1.56 cm     PV Peak grad:   1.7 mmHg AV Area (VTI):     1.58 cm     RVOT Peak grad: 2 mmHg AV Vmax:           143.00 cm/s AV Vmean:          104.667 cm/s AV VTI:            0.309 m AV Peak Grad:      8.2 mmHg AV Mean Grad:      5.0 mmHg LVOT Vmax:         73.80 cm/s LVOT Vmean:        52.000 cm/s LVOT VTI:          0.156 m LVOT/AV VTI ratio: 0.50  AORTA Ao Root diam: 3.10 cm MITRAL VALVE               TRICUSPID VALVE MV Area (PHT): 4.54 cm    TR Peak grad:   11.2 mmHg MV Decel Time: 167 msec    TR Vmax:        167.00 cm/s MV E velocity: 77.10 cm/s MV A velocity: 72.00 cm/s  SHUNTS MV E/A ratio:  1.07        Systemic VTI:  0.16 m                            Systemic Diam: 2.00 cm Kate Sable MD Electronically signed by Kate Sable MD Signature Date/Time: 06/01/2020/4:06:38 PM    Final       ASSESSMENT & PLAN:  1. Light chain (AL) amyloidosis (HCC)   2. Encounter for antineoplastic chemotherapy   3. 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[place])   4. Transaminitis   5.  Goals of care, counseling/discussion   6. Hypokalemia   7. Non-intractable vomiting with nausea, unspecified vomiting type    # AL-amyloidosis Diagnosis of AL amyloidosis was discussed with patient.  Patient has biopsy confirmed liver involvement, possible kidney involvement- Labs reviewed and discussed with patient Proceed with cycle 1 day 21 Dara-CyBorD. Transaminitis, hyperbilirubinemia.  Due to involvement of amyloidosis. Dose reduce Velcade to 0.7 mg/m, continue cyclophosphamide 500 mg orally today. Pending second opinion at [place].  #Hypokalemia, potassium level 3.4.  Recommend patient to start  potassium chloride 10 mEq daily.  Prescription sent to pharmacy. #Chronic kidney disease, follow-up with nephrology.  Kidney function stable.  #Nausea, recommend patient continue Zofran as instructed.  I also sent her prescription of Compazine 10 mg every 6 hours as needed for nausea and vomiting.  Also discussed with patient about utilizing Ativan 0.5 mg every 6 hours as needed for nausea vomiting associated with anxiety.  Avoid driving after taking Ativan. Supportive care measures are necessary for patient well-being and will be provided as necessary. We spent sufficient time to discuss many aspect of care, questions were answered to patient's satisfaction.  All questions were answered. The patient knows to call the clinic with any problems questions or concerns. Follow-up in 1 week. cc Leonel Ramsay, MD   [name], MD, PhD Hematology Oncology [facility] Pager- [phone] 07/20/2020

## 2020-07-20 NOTE — Addendum Note (Signed)
Addended by: [name] on: 07/20/2020 08:06 PM   Modules accepted: Orders

## 2020-07-20 NOTE — Progress Notes (Signed)
Bilirubin 2.1, okay to proceed with treatment per Dr Tasia Catchings

## 2020-07-21 ENCOUNTER — Other Ambulatory Visit (HOSPITAL_COMMUNITY): Payer: Self-pay

## 2020-07-21 ENCOUNTER — Telehealth: Payer: Self-pay

## 2020-07-21 LAB — KAPPA/LAMBDA LIGHT CHAINS
Kappa free light chain: 6.8 mg/L (ref 3.3–19.4)
Kappa, lambda light chain ratio: 0.86 (ref 0.26–1.65)
Lambda free light chains: 7.9 mg/L (ref 5.7–26.3)

## 2020-07-21 MED FILL — Cyclophosphamide Cap 50 MG: ORAL | 28 days supply | Qty: 40 | Fill #0 | Status: AC

## 2020-07-21 NOTE — Telephone Encounter (Signed)
07/21/2020- Spoke with patient and she agreed to start the potassium. Patient want to rely to Dr.Yu that the 9 pills she was to take, that after she took them she ended up vomiting them back up, patient would like to know if there is any recommendation from Dr.Yu regarding that, Also she has been taking nausea medication. [facility]

## 2020-07-21 NOTE — Telephone Encounter (Signed)
Was able to reach pt via phone, spoke with Nancy Rodriguez regarding her Cardia MRI at [facility], explained that Dr. Saunders Revel was unsuccessful with trying to get anyone on the phone after several attempts to have her MRI bump up sooner. Pt reports she is very grateful for the attempt, but is okay with waiting until the 19th to have it done. She goes on to say she is very "thankful for the hard work of this clinic and for Dr. Saunders Revel". This RN was very appreciated of pt's kind words. She was f/u appt with Dr. Saunders Revel on 4/29 and is planning to keep that appt. Nothing further at this time.

## 2020-07-21 NOTE — Telephone Encounter (Signed)
Please let me know what number I need to call to speak with someone in the imaging department at [facility] to see if cardiac MRI can be moved up.  Thanks.  Nelva Bush, MD [facility]

## 2020-07-21 NOTE — Telephone Encounter (Signed)
-----   Message from [name], MD sent at 07/20/2020  8:06 PM EDT ----- Please let patient know that I recommend her to take potassium 1 tablet daily. Rx has been sent to pharmacy. Thanks.

## 2020-07-21 NOTE — Telephone Encounter (Signed)
Please let Ms. Lecomte know that I have called the [facility]iac MRI scheduling department twice and was on hold for ~10 minutes both times.  The system then hung up on me without giving me the opportunity to speak with a person.  I am therefore unable to inquire about moving up her MRI at [facility].  Given reassuring echo and lack of symptoms at our last visit, I think it is reasonable to proceed with the MRI as scheduled on 5/19.  If Ms. Wernick feels that the MRI needs to be done sooner, we can try to arrange for it to be done at [facility] or she can try reaching out to [facility] again.  Nelva Bush, MD [facility]

## 2020-07-22 LAB — MULTIPLE MYELOMA PANEL, SERUM
Albumin SerPl Elph-Mcnc: 1.8 g/dL — ABNORMAL LOW (ref 2.9–4.4)
Albumin/Glob SerPl: 0.7 (ref 0.7–1.7)
Alpha 1: 0.3 g/dL (ref 0.0–0.4)
Alpha2 Glob SerPl Elph-Mcnc: 0.8 g/dL (ref 0.4–1.0)
B-Globulin SerPl Elph-Mcnc: 1.2 g/dL (ref 0.7–1.3)
Gamma Glob SerPl Elph-Mcnc: 0.6 g/dL (ref 0.4–1.8)
Globulin, Total: 2.9 g/dL (ref 2.2–3.9)
IgA: 79 mg/dL (ref 64–422)
IgG (Immunoglobin G), Serum: 649 mg/dL (ref 586–1602)
IgM (Immunoglobulin M), Srm: 35 mg/dL (ref 26–217)
M Protein SerPl Elph-Mcnc: 0.4 g/dL — ABNORMAL HIGH
Total Protein ELP: 4.7 g/dL — ABNORMAL LOW (ref 6.0–8.5)

## 2020-07-22 NOTE — Telephone Encounter (Signed)
Call returned to patient to let her know tht Dr Tasia Catchings will give her additional nausea medicine next week. Nancy Rodriguez thanked me for letting her know

## 2020-07-22 NOTE — Telephone Encounter (Signed)
I do not understand what I am to do about his. Looks like patient is asking what to do about her vomiting Potassium pills like she did last time

## 2020-07-22 NOTE — Telephone Encounter (Signed)
Nancy Rodriguez, she is concerned about vomiting out her cyclophosphamide pills. Please let her know that I will add additional nausea medication next week when she sees me. That is when she is due to take next cyclophosphamide treatment.

## 2020-07-22 NOTE — Telephone Encounter (Signed)
fyi

## 2020-07-27 ENCOUNTER — Telehealth: Payer: Self-pay | Admitting: *Deleted

## 2020-07-27 ENCOUNTER — Other Ambulatory Visit: Payer: Self-pay

## 2020-07-27 ENCOUNTER — Inpatient Hospital Stay: Payer: Medicare Other

## 2020-07-27 ENCOUNTER — Encounter: Payer: Self-pay | Admitting: Oncology

## 2020-07-27 ENCOUNTER — Inpatient Hospital Stay (HOSPITAL_BASED_OUTPATIENT_CLINIC_OR_DEPARTMENT_OTHER): Payer: Medicare Other | Admitting: Oncology

## 2020-07-27 ENCOUNTER — Ambulatory Visit
Admission: RE | Admit: 2020-07-27 | Discharge: 2020-07-27 | Disposition: A | Discharge status: Home / Self Care | Payer: Medicare Other | Source: Ambulatory Visit | Attending: Oncology | Admitting: Oncology

## 2020-07-27 VITALS — BP 136/76 | HR 87 | Temp 98.6°F | Resp 18 | Wt 165.7 lb

## 2020-07-27 DIAGNOSIS — N1831 Chronic kidney disease, stage 3a: Secondary | ICD-10-CM

## 2020-07-27 DIAGNOSIS — D631 Anemia in chronic kidney disease: Secondary | ICD-10-CM

## 2020-07-27 DIAGNOSIS — R112 Nausea with vomiting, unspecified: Secondary | ICD-10-CM

## 2020-07-27 DIAGNOSIS — T451X5A Adverse effect of antineoplastic and immunosuppressive drugs, initial encounter: Secondary | ICD-10-CM | POA: Insufficient documentation

## 2020-07-27 DIAGNOSIS — E8581 Light chain (AL) amyloidosis: Secondary | ICD-10-CM

## 2020-07-27 DIAGNOSIS — R7401 Elevation of levels of liver transaminase levels: Secondary | ICD-10-CM

## 2020-07-27 DIAGNOSIS — Z5111 Encounter for antineoplastic chemotherapy: Secondary | ICD-10-CM | POA: Diagnosis not present

## 2020-07-27 DIAGNOSIS — E876 Hypokalemia: Secondary | ICD-10-CM | POA: Insufficient documentation

## 2020-07-27 DIAGNOSIS — E859 Amyloidosis, unspecified: Secondary | ICD-10-CM

## 2020-07-27 DIAGNOSIS — Z5112 Encounter for antineoplastic immunotherapy: Secondary | ICD-10-CM | POA: Diagnosis not present

## 2020-07-27 DIAGNOSIS — M7989 Other specified soft tissue disorders: Secondary | ICD-10-CM | POA: Insufficient documentation

## 2020-07-27 LAB — COMPREHENSIVE METABOLIC PANEL
ALT: 40 U/L (ref 0–44)
AST: 62 U/L — ABNORMAL HIGH (ref 15–41)
Albumin: 2 g/dL — ABNORMAL LOW (ref 3.5–5.0)
Alkaline Phosphatase: 997 U/L — ABNORMAL HIGH (ref 38–126)
Anion gap: 8 (ref 5–15)
BUN: 19 mg/dL (ref 8–23)
CO2: 27 mmol/L (ref 22–32)
Calcium: 8.4 mg/dL — ABNORMAL LOW (ref 8.9–10.3)
Chloride: 101 mmol/L (ref 98–111)
Creatinine, Ser: 1.43 mg/dL — ABNORMAL HIGH (ref 0.44–1.00)
GFR, Estimated: 38 mL/min — ABNORMAL LOW (ref >60)
Glucose, Bld: 174 mg/dL — ABNORMAL HIGH (ref 70–99)
Potassium: 3.7 mmol/L (ref 3.5–5.1)
Sodium: 136 mmol/L (ref 135–145)
Total Bilirubin: 2.3 mg/dL — ABNORMAL HIGH (ref 0.3–1.2)
Total Protein: 5.4 g/dL — ABNORMAL LOW (ref 6.5–8.1)

## 2020-07-27 LAB — CBC WITH DIFFERENTIAL/PLATELET
Abs Immature Granulocytes: 0.06 10*3/uL (ref 0.00–0.07)
Basophils Absolute: 0 10*3/uL (ref 0.0–0.1)
Basophils Relative: 1 %
Eosinophils Absolute: 0.1 10*3/uL (ref 0.0–0.5)
Eosinophils Relative: 1 %
HCT: 26.4 % — ABNORMAL LOW (ref 36.0–46.0)
Hemoglobin: 9.1 g/dL — ABNORMAL LOW (ref 12.0–15.0)
Immature Granulocytes: 1 %
Lymphocytes Relative: 29 %
Lymphs Abs: 1.3 10*3/uL (ref 0.7–4.0)
MCH: 25.5 pg — ABNORMAL LOW (ref 26.0–34.0)
MCHC: 34.5 g/dL (ref 30.0–36.0)
MCV: 73.9 fL — ABNORMAL LOW (ref 80.0–100.0)
Monocytes Absolute: 0.6 10*3/uL (ref 0.1–1.0)
Monocytes Relative: 13 %
Neutro Abs: 2.5 10*3/uL (ref 1.7–7.7)
Neutrophils Relative %: 55 %
Platelets: 455 10*3/uL — ABNORMAL HIGH (ref 150–400)
RBC: 3.57 MIL/uL — ABNORMAL LOW (ref 3.87–5.11)
RDW: 22.7 % — ABNORMAL HIGH (ref 11.5–15.5)
WBC: 4.6 10*3/uL (ref 4.0–10.5)
nRBC: 5.3 % — ABNORMAL HIGH (ref 0.0–0.2)

## 2020-07-27 IMAGING — US US EXTREM LOW VENOUS
1 series · 13 of 24 positions shown · non-contrast
Comparison: None.

CLINICAL DATA: Bilateral lower extremity swelling



[Series 1: us venous img lower bilat (dvt) · portal-venous · 13 of 69 slices shown]
[im 1/69]
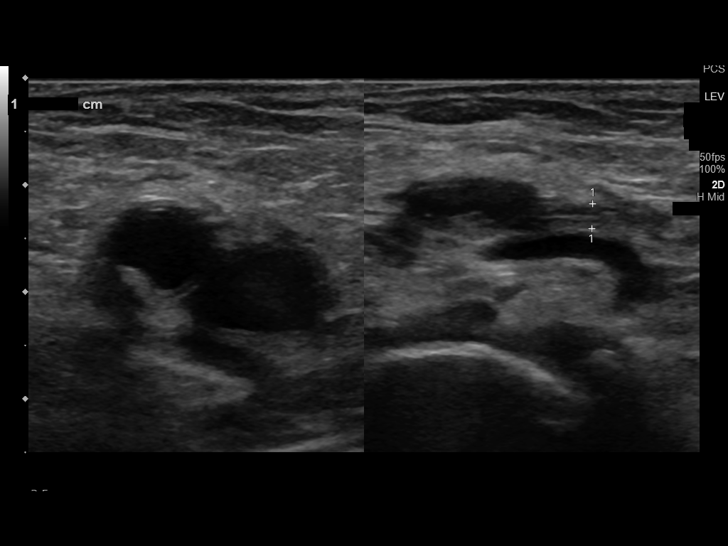
[im 6/69]
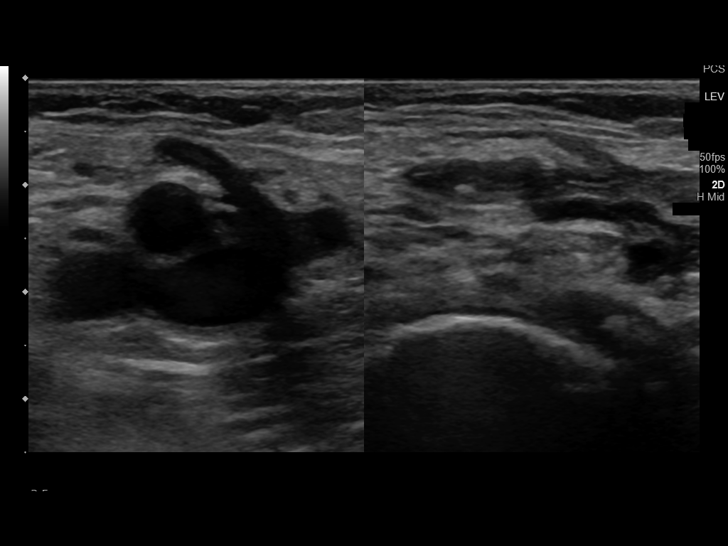
[im 12/69]
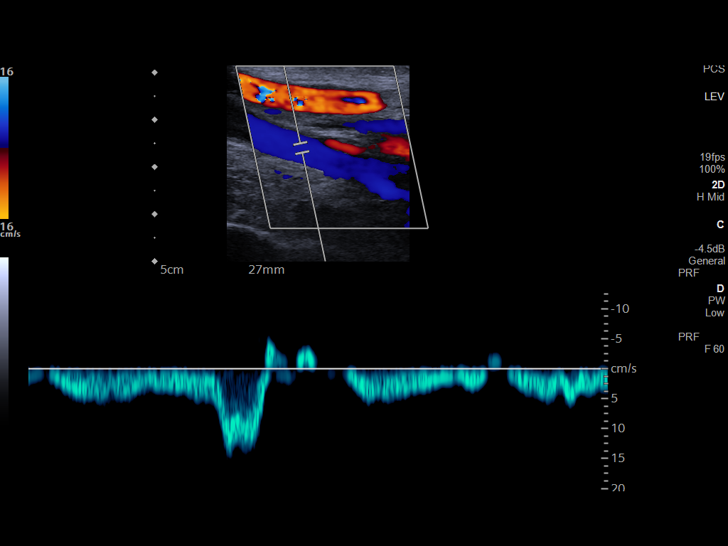
[im 18/69]
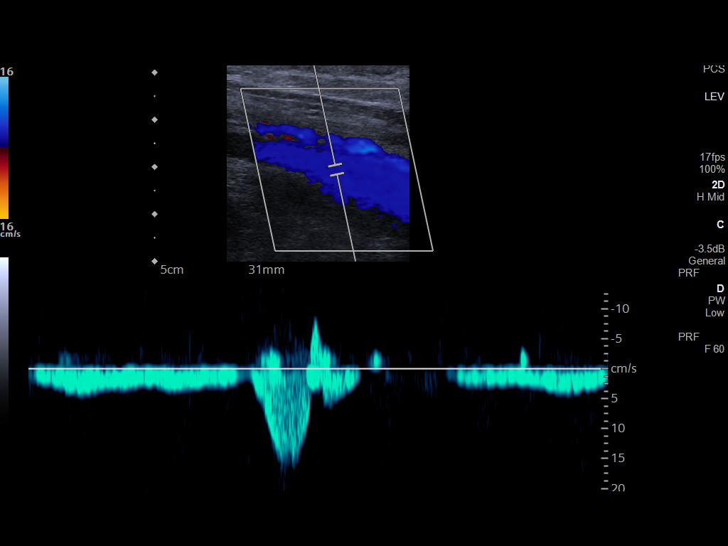
[im 24/69]
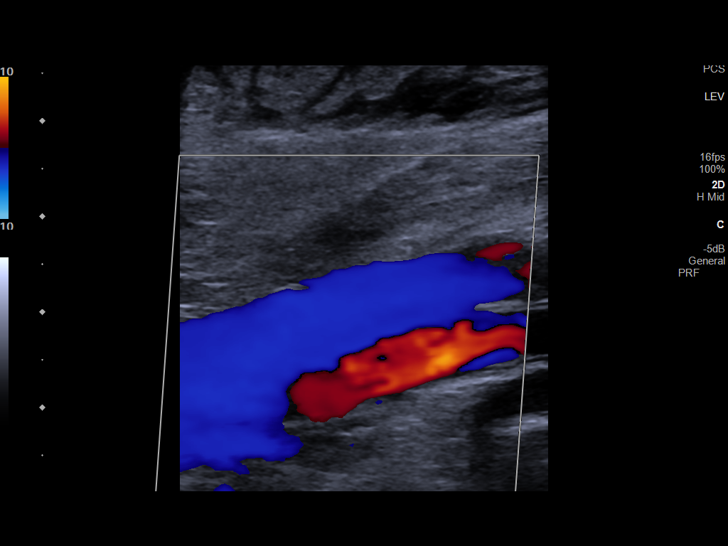
[im 30/69]
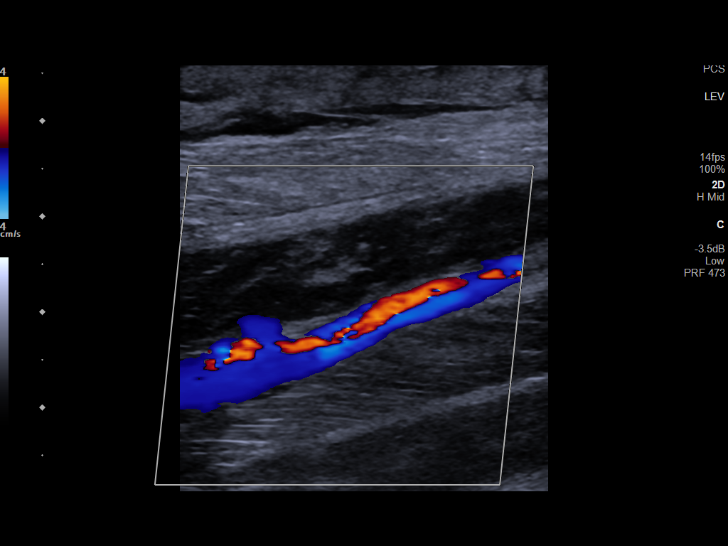
[im 36/69]
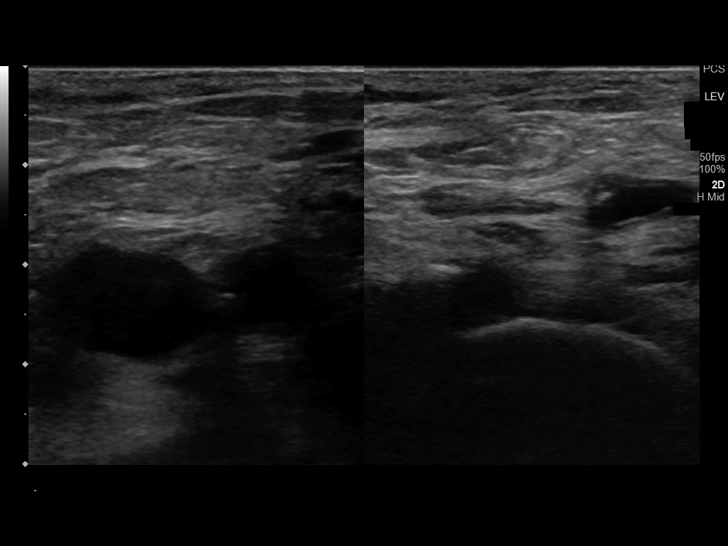
[im 39/69]
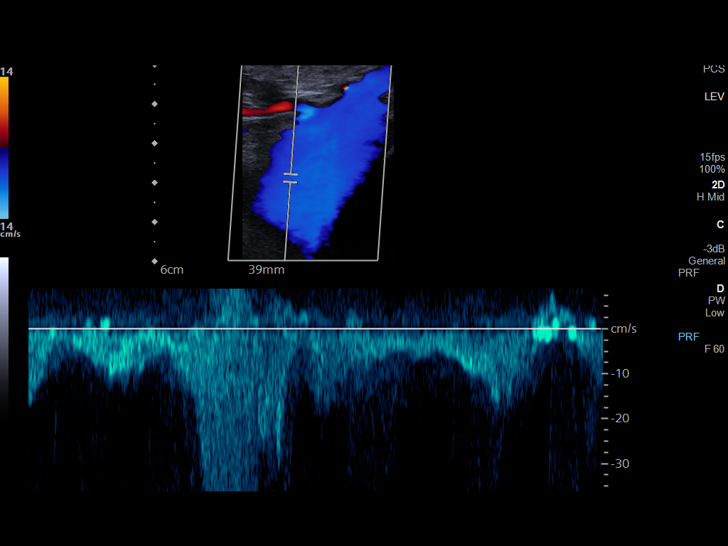
[im 45/69]
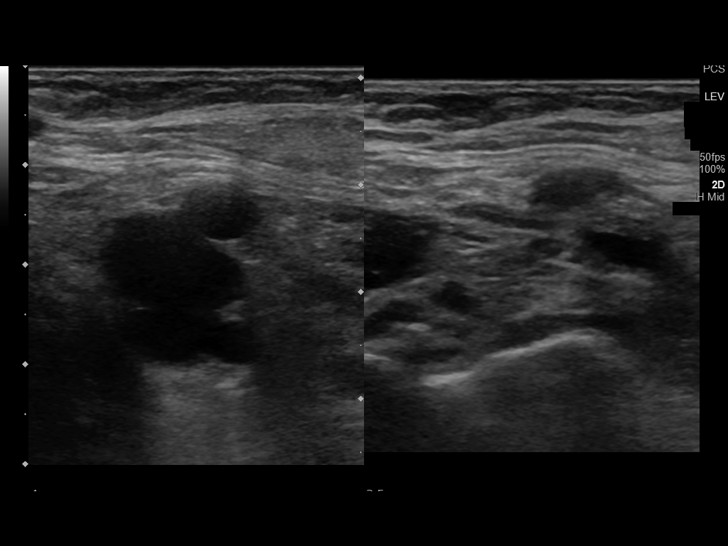
[im 51/69]
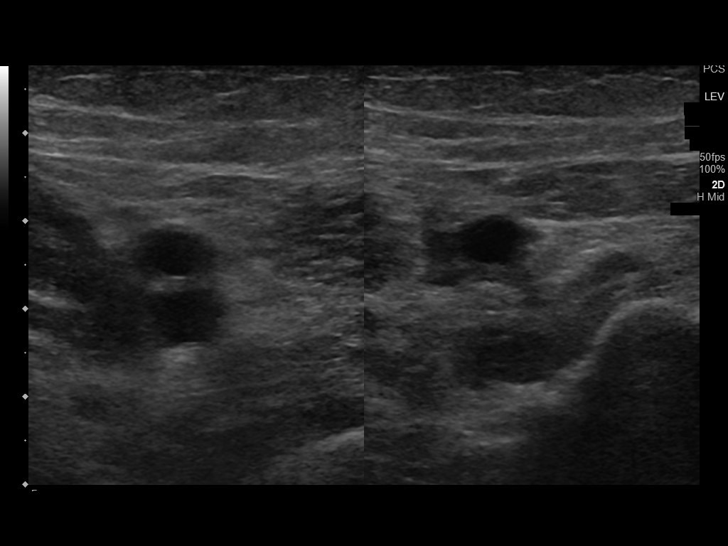
[im 57/69]
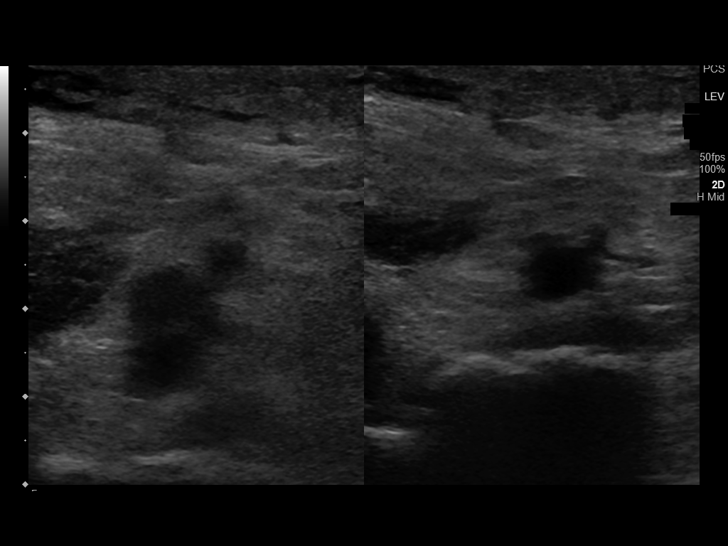
[im 63/69]
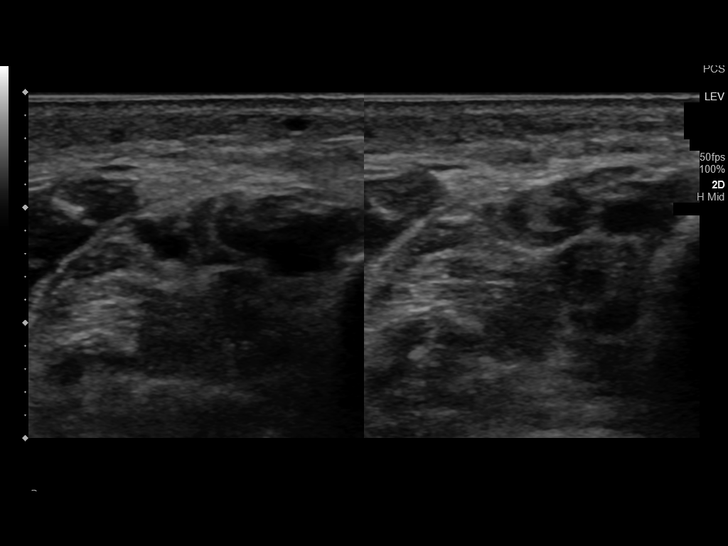
[im 69/69]
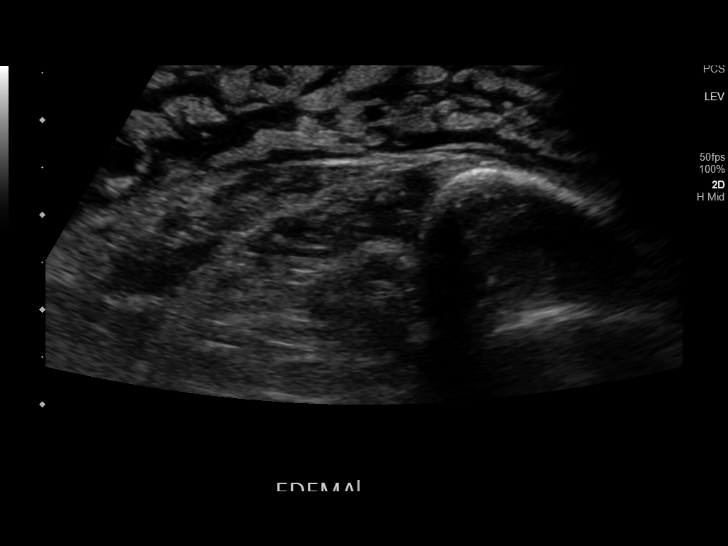

[13 of 24 positions shown; findings below may reference images not displayed]

FINDINGS: RIGHT LOWER EXTREMITY

Common Femoral Vein: No evidence of thrombus. Normal
compressibility, respiratory phasicity and response to augmentation.

Saphenofemoral Junction: No evidence of thrombus. Normal
compressibility and flow on color Doppler imaging.

Profunda Femoral Vein: No evidence of thrombus. Normal
compressibility and flow on color Doppler imaging.

Femoral Vein: No evidence of thrombus. Normal compressibility,
respiratory phasicity and response to augmentation.

Popliteal Vein: No evidence of thrombus. Normal compressibility,
respiratory phasicity and response to augmentation.

Calf Veins: No evidence of thrombus. Normal compressibility and flow
on color Doppler imaging.

Other Findings:  Lower extremity subcutaneous edema noted

LEFT LOWER EXTREMITY

Common Femoral Vein: No evidence of thrombus. Normal
compressibility, respiratory phasicity and response to augmentation.

Saphenofemoral Junction: No evidence of thrombus. Normal
compressibility and flow on color Doppler imaging.

Profunda Femoral Vein: No evidence of thrombus. Normal
compressibility and flow on color Doppler imaging.

Femoral Vein: No evidence of thrombus. Normal compressibility,
respiratory phasicity and response to augmentation.

Popliteal Vein: No evidence of thrombus. Normal compressibility,
respiratory phasicity and response to augmentation.

Calf Veins: No evidence of thrombus. Normal compressibility and flow
on color Doppler imaging.

Other Findings:  Lower extremity subcutaneous edema noted
IMPRESSION: No evidence of deep venous thrombosis in either lower extremity.

## 2020-07-27 MED ORDER — DEXAMETHASONE 4 MG PO TABS
40.0000 mg | ORAL_TABLET | Freq: Once | ORAL | Status: AC
  Administered 2020-07-27: 40 mg via ORAL
  Filled 2020-07-27: qty 10

## 2020-07-27 MED ORDER — SODIUM CHLORIDE 0.9 % IV SOLN
150.0000 mg | Freq: Once | INTRAVENOUS | Status: AC
Start: 2020-07-27 — End: 2020-07-27
  Administered 2020-07-27: 150 mg via INTRAVENOUS
  Filled 2020-07-27: qty 150

## 2020-07-27 MED ORDER — ACETAMINOPHEN 325 MG PO TABS
650.0000 mg | ORAL_TABLET | Freq: Once | ORAL | Status: AC
  Administered 2020-07-27: 650 mg via ORAL
  Filled 2020-07-27: qty 2

## 2020-07-27 MED ORDER — SODIUM CHLORIDE 0.9 % IV SOLN
Freq: Once | INTRAVENOUS | Status: AC
  Filled 2020-07-27: qty 250

## 2020-07-27 MED ORDER — DIPHENHYDRAMINE HCL 25 MG PO CAPS
50.0000 mg | ORAL_CAPSULE | Freq: Once | ORAL | Status: AC
  Administered 2020-07-27: 50 mg via ORAL
  Filled 2020-07-27: qty 2

## 2020-07-27 MED ORDER — DARATUMUMAB-HYALURONIDASE-FIHJ 1800-30000 MG-UT/15ML ~~LOC~~ SOLN
1800.0000 mg | Freq: Once | SUBCUTANEOUS | Status: AC
  Administered 2020-07-27: 1800 mg via SUBCUTANEOUS
  Filled 2020-07-27: qty 15

## 2020-07-27 MED ORDER — BORTEZOMIB CHEMO SQ INJECTION 3.5 MG (2.5MG/ML)
0.7000 mg/m2 | Freq: Once | INTRAMUSCULAR | Status: AC
Start: 2020-07-27 — End: 2020-07-27
  Administered 2020-07-27: 1.25 mg via SUBCUTANEOUS
  Filled 2020-07-27: qty 0.5

## 2020-07-27 MED ORDER — PALONOSETRON HCL INJECTION 0.25 MG/5ML
0.2500 mg | Freq: Once | INTRAVENOUS | Status: AC
Start: 2020-07-27 — End: 2020-07-27
  Administered 2020-07-27: 0.25 mg via INTRAVENOUS
  Filled 2020-07-27: qty 5

## 2020-07-27 NOTE — Progress Notes (Signed)
Patient has lower extremity edema that does improve with compression socks and elevation.

## 2020-07-27 NOTE — Progress Notes (Signed)
Per Dr. Tasia Catchings, [place] to treat with elevated bilirubin 2.1.

## 2020-07-27 NOTE — Telephone Encounter (Signed)
Called report.  Patient has gone home.  Narrative & Impression  CLINICAL DATA:  Bilateral lower extremity swelling  EXAM: BILATERAL LOWER EXTREMITY VENOUS DOPPLER ULTRASOUND  TECHNIQUE: Gray-scale sonography with graded compression, as well as color Doppler and duplex ultrasound were performed to evaluate the lower extremity deep venous systems from the level of the common femoral vein and including the common femoral, femoral, profunda femoral, popliteal and calf veins including the posterior tibial, peroneal and gastrocnemius veins when visible. The superficial great saphenous vein was also interrogated. Spectral Doppler was utilized to evaluate flow at rest and with distal augmentation maneuvers in the common femoral, femoral and popliteal veins.  COMPARISON:  None.  FINDINGS: RIGHT LOWER EXTREMITY  Common Femoral Vein: No evidence of thrombus. Normal compressibility, respiratory phasicity and response to augmentation.  Saphenofemoral Junction: No evidence of thrombus. Normal compressibility and flow on color Doppler imaging.  Profunda Femoral Vein: No evidence of thrombus. Normal compressibility and flow on color Doppler imaging.  Femoral Vein: No evidence of thrombus. Normal compressibility, respiratory phasicity and response to augmentation.  Popliteal Vein: No evidence of thrombus. Normal compressibility, respiratory phasicity and response to augmentation.  Calf Veins: No evidence of thrombus. Normal compressibility and flow on color Doppler imaging.  Other Findings:  Lower extremity subcutaneous edema noted  LEFT LOWER EXTREMITY  Common Femoral Vein: No evidence of thrombus. Normal compressibility, respiratory phasicity and response to augmentation.  Saphenofemoral Junction: No evidence of thrombus. Normal compressibility and flow on color Doppler imaging.  Profunda Femoral Vein: No evidence of thrombus. Normal compressibility and flow on  color Doppler imaging.  Femoral Vein: No evidence of thrombus. Normal compressibility, respiratory phasicity and response to augmentation.  Popliteal Vein: No evidence of thrombus. Normal compressibility, respiratory phasicity and response to augmentation.  Calf Veins: No evidence of thrombus. Normal compressibility and flow on color Doppler imaging.  Other Findings:  Lower extremity subcutaneous edema noted  IMPRESSION: No evidence of deep venous thrombosis in either lower extremity.   Electronically Signed   By: Jerilynn Mages.  Shick M.D.   On: 07/27/2020 14:28

## 2020-07-27 NOTE — Progress Notes (Signed)
Hematology/Oncology  Follow up note [facility] [phone] [phone]   Patient Care Team: Leonel Ramsay, MD as PCP - General (Infectious Diseases)  REFERRING PROVIDER: Leonel Ramsay, MD  CHIEF COMPLAINTS/REASON FOR VISIT:  Follow-up for amyloidosis  HISTORY OF PRESENTING ILLNESS:   Nancy Rodriguez is a  75 y.o.  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  Work up include protein electrophoresis showed M protein 0.7,   Reviewed her previous medical records via care everywhere.  She was seen by Hematology Oncology at [facility] on 02/14/2017.  07/25/2007  SPEP showed M protein of 0.35, IFE showed IgG lamda 09/22/2007  Bone survey negative.  02/15/14 IgG 930, SPEP M protien 0.22, free kappa light chain 3.59, lamda 2.92, ratio 1.23  Hypercalcemia, resolved after stopping HCTZ.  # Transaminitis 03/28/20 US abdomen showed left liver lobe Complex 1.7 cm cyst  # 04/07/20 MRI abdomen w/wo contrast showed 2 benign liver cysts, 2 nonspecific hypovascular irighr liver lesions, abnormal bone marrow signal of L1 vertebral body. Patient was  advised to proceed with bone marrow biopsy and she declined.  # referred her to established care with GI and was seen on 04/19/20,  # Liver biopsy showed amyloidosis. [place] MS/MS showed AL type # 05/20/20 recommend bone marrow biopsy which is scheduled on 05/24/2020.  Patient changed her mind and ask a biopsy to be canceled.  My team and I had multiple phone discussion with patient's daughter [name] and later with [name].  Patient agreed with bone marrow biopsy and a biopsy was scheduled on 06/09/2020.  05/20/20 NT proBNP  was normal,  TSH slightly increased, normal T4 Normal factor X Normal coags Troponin 19. Refer to  cardiology for evaluation. May need cardiac MRI  # 06/01/2020 2D echo result was reviewed and discussed with patient.  Patient has normal LVEF 60-65%. Mild asymmetric left ventricular hypertrophy.  Left ventricular diastolic parameters are indeterminate. average left ventricular global longitudinal strain is -16.8 %.  # 06/09/2020, bone marrow biopsy showed monoclonal plasmacytosis, 8%, amyloid deposit present.  Absent iron stores. 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 t (14;20) #06/17/2020, further discussed about diagnosis and treatment plan.  Patient declined bone marrow transplant evaluation.  Decision was made to proceed with Dara-CyborD chemotherapy treatments.  INTERVAL HISTORY Nancy Rodriguez is a 75 y.o. female who has above history reviewed by me today presents for follow up visit for management of AL amyloidosis Problems and complaints are listed below: Patient presents for the evaluation prior to Dara-CyborD treatment. She was accompanied by one of her daughters.  She also called her son [name] who state connected throughout the entire encounter..   Patient has been referred to establish care with [facility] for second opinion. Patient reports feeling nauseated and had one episode of vomiting around 10 PM on the day of last chemotherapy. Bilateral lower extremity swelling, worse.  No calf tenderness.. .  Review of Systems  Constitutional: Negative for appetite change, chills, fatigue and fever.  HENT:   Negative for hearing loss, nosebleeds and voice change.   Eyes: Negative for eye problems.  Respiratory: Negative for chest tightness and cough.   Cardiovascular: Positive for leg swelling. Negative for chest pain.  Gastrointestinal: Positive for nausea. Negative for  abdominal distention, abdominal pain and blood in stool.  Endocrine: Negative for hot flashes.  Genitourinary: Negative for difficulty urinating and frequency.    Musculoskeletal: Negative for arthralgias.  Skin: Positive for itching. Negative for rash.  Neurological: Positive for numbness. Negative for extremity weakness.  Hematological: Negative for adenopathy.  Psychiatric/Behavioral: Negative for confusion.    MEDICAL HISTORY:  Past Medical History:  Diagnosis Date  . Anemia    Anemia in chronic kidney disease  . Chronic kidney disease    Stage 3b chronic kidney disease  . Diabetes mellitus without complication ([place])   . Hypercholesterolemia   . Hypertension   . MGUS (monoclonal gammopathy of unknown significance)   . Osteoarthritis   . Rheumatoid arthritis ([place])     SURGICAL HISTORY: Past Surgical History:  Procedure Laterality Date  . HERNIA REPAIR    . PARATHYROIDECTOMY      SOCIAL HISTORY: Social History   Socioeconomic History  . Marital status: Widowed    Spouse name: Not on file  . Number of children: 6  . Years of education: Not on file  . Highest education level: Not on file  Occupational History  . Not on file  Tobacco Use  . Smoking status: Never Smoker  . Smokeless tobacco: Never Used  Substance and Sexual Activity  . Alcohol use: No  . Drug use: Never  . Sexual activity: Not on file  Other Topics Concern  . Not on file  Social History Narrative  . Not on file   Social Determinants of Health   Financial Resource Strain: Not on file  Food Insecurity: Not on file  Transportation Needs: Not on file  Physical Activity: Not on file  Stress: Not on file  Social Connections: Not on file  Intimate Partner Violence: Not on file    FAMILY HISTORY: Family History  Problem Relation Age of Onset  . Diabetes Daughter   . Diabetes Son   . Dementia Mother   . Cancer Father   . Esophageal cancer Sister   . Brain cancer Brother     ALLERGIES:  is allergic to benazepril, lisinopril, tolmetin, and nsaids.  MEDICATIONS:  Current Outpatient Medications  Medication Sig Dispense Refill  . acyclovir  (ZOVIRAX) 400 MG tablet Take 1 tablet (400 mg total) by mouth 2 (two) times daily. 60 tablet 5  . amLODipine (NORVASC) 5 MG tablet Take by mouth.    . cyclophosphamide (CYTOXAN) 50 MG capsule TAKE 10 CAPSULES (500 MG TOTAL) BY MOUTH ONCE A WEEK. TAKE WITH BREAKFAST. 40 capsule 3  . docusate sodium (COLACE) 100 MG capsule Take 1 capsule by mouth as needed.    Marland Kitchen EPINEPHrine 0.3 mg/0.3 mL IJ SOAJ injection as needed    . insulin glargine (LANTUS SOLOSTAR) 100 UNIT/ML Solostar Pen Inject 10 Units into the skin at bedtime.    Marland Kitchen LORazepam (ATIVAN) 0.5 MG tablet Take 1 tablet (0.5 mg total) by mouth every 6 (six) hours as needed (nausea vomiting). 30 tablet 0  . metoprolol succinate (TOPROL-XL) 50 MG 24 hr tablet TAKE 1 TABLET (50 MG TOTAL) BY MOUTH DAILY IN THE MORNING    . mirtazapine (REMERON) 7.5 MG tablet Take 1 tablet (7.5 mg total) by mouth at bedtime. 30 tablet 2  . ondansetron (ZOFRAN) 8 MG tablet Take 8 mg by mouth 30 to 60 min prior to Cytoxan administration then take 8 mg twice daily as needed for nausea and vomiting. 30 tablet 1  . potassium chloride (KLOR-CON) 10 MEQ tablet  Take 1 tablet (10 mEq total) by mouth daily. 30 tablet 0  . prochlorperazine (COMPAZINE) 10 MG tablet Take 1 tablet (10 mg total) by mouth every 6 (six) hours as needed for nausea or vomiting. 60 tablet 1  . traMADol (ULTRAM) 50 MG tablet Take 1 tablet (50 mg total) by mouth every 6 (six) hours as needed for severe pain. 8 tablet 0  . albuterol (VENTOLIN HFA) 108 (90 Base) MCG/ACT inhaler Inhale 2 puffs into the lungs every 4 (four) hours as needed. (Patient not taking: No sig reported)     No current facility-administered medications for this visit.     PHYSICAL EXAMINATION: ECOG PERFORMANCE STATUS: 1 - Symptomatic but completely ambulatory Vitals:   07/27/20 0934  BP: 136/76  Pulse: 87  Resp: 18  Temp: 98.6 F (37 C)   Filed Weights   07/27/20 0934  Weight: 165 lb 11.2 oz (75.2 kg)    Physical  Exam Constitutional:      General: She is not in acute distress. HENT:     Head: Normocephalic and atraumatic.  Eyes:     General: No scleral icterus. Cardiovascular:     Rate and Rhythm: Normal rate and regular rhythm.     Heart sounds: Normal heart sounds.  Pulmonary:     Effort: Pulmonary effort is normal. No respiratory distress.     Breath sounds: No wheezing.  Abdominal:     General: Bowel sounds are normal. There is no distension.     Palpations: Abdomen is soft.  Musculoskeletal:        General: Swelling present. No deformity. Normal range of motion.     Cervical back: Normal range of motion and neck supple.     Comments: Bilateral lower extremity 1+ edema  Skin:    General: Skin is warm and dry.     Findings: No erythema or rash.  Neurological:     Mental Status: She is alert and oriented to person, place, and time. Mental status is at baseline.     Cranial Nerves: No cranial nerve deficit.     Coordination: Coordination normal.  Psychiatric:        Mood and Affect: Mood normal.     LABORATORY DATA:  I have reviewed the data as listed Lab Results  Component Value Date   WBC 4.6 07/27/2020   HGB 9.1 (L) 07/27/2020   HCT 26.4 (L) 07/27/2020   MCV 73.9 (L) 07/27/2020   PLT 455 (H) 07/27/2020   Recent Labs    09/02/19 1608 03/16/20 1124 07/13/20 0922 07/20/20 0914 07/27/20 0914  NA 138   < > 137 137 136  K 3.6   < > 3.8 3.4* 3.7  CL 100   < > 102 103 101  CO2 29   < > $R'27 27 27  'ed$ GLUCOSE 216*   < > 173* 159* 174*  BUN 24*   < > $R'16 14 19  'oX$ CREATININE 1.15*   < > 1.10* 0.93 1.43*  CALCIUM 9.4   < > 8.7* 8.3* 8.4*  GFRNONAA 47*   < > 52* >60 38*  GFRAA 54*  --   --   --   --   PROT 8.4*   < > 5.7* 5.3* 5.4*  ALBUMIN 3.9   < > 1.9* 1.9* 2.0*  AST 31   < > 94* 72* 62*  ALT 27   < > 52* 45* 40  ALKPHOS 170*   < > 1,196* 991* 997*  BILITOT  1.1   < > 3.3* 2.1* 2.3*   < > = values in this interval not displayed.   Iron/TIBC/Ferritin/ %Sat    Component  Value Date/Time   IRON 45 09/02/2019 1608   TIBC 416 09/02/2019 1608   FERRITIN 35 09/02/2019 1608   IRONPCTSAT 11 09/02/2019 1608     08/25/2019, platelet count 491, WBC 7.5, hemoglobin 12 Creatinine 1.58, EGFR 37, calcium 10.8, albumin 4.2 Negative hepatitis B surface antigen, hepatitis B core antibody, Negative hepatitis C 08/05/2019, A1c 11.2   RADIOGRAPHIC STUDIES: I have personally reviewed the radiological images as listed and agreed with the findings in the report. MR Lumbar Spine W Wo Contrast  Result Date: 05/11/2020 CLINICAL DATA:  bone lesion on MRI abd EXAM: MRI LUMBAR SPINE WITHOUT AND WITH CONTRAST TECHNIQUE: Multiplanar and multiecho pulse sequences of the lumbar spine were obtained without and with intravenous contrast. CONTRAST:  24mL GADAVIST GADOBUTROL 1 MMOL/ML IV SOLN COMPARISON:  04/07/2020. FINDINGS: Segmentation:  Standard. Alignment:  Minimal grade 1 L4-5 anterolisthesis. Vertebrae: Vertebral body heights are preserved. Mild Modic type 2 endplate degenerative changes. T1/T2 heterogenous signal involving the entirety of the L1 vertebral body with associated STIR hyperintensity and heterogenous enhancement. No other lesion identified. Conus medullaris and cauda equina: Conus extends to the L2 level. Conus and cauda equina appear normal. Disc levels: Multilevel desiccation. T12-L1: Small central protrusion. Patent spinal canal and neural foramen. L1-2: Minimal disc bulge and bilateral facet degenerative spurring. Patent spinal canal and neural foramen. L2-3: Minimal disc bulge with superimposed bilateral extraforaminal protrusions. Facet degenerative spurring. Patent spinal canal and neural foramen. L3-4: Disc bulge with superimposed left foraminal protrusion. Facet degenerative spurring. Patent spinal canal. Mild bilateral neural foraminal narrowing. L4-5: Uncovered bulge with small superimposed left subarticular protrusion. Bilateral facet hypertrophy and ligamentum flavum  thickening. Mild spinal canal and bilateral neural foraminal narrowing. L5-S1: Disc bulge with shallow central protrusion. Bilateral facet degenerative spurring. Patent spinal canal. Mild bilateral neural foraminal narrowing. Paraspinal and other soft tissues: Negative. 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 If suspicion persists short-term interval follow-up in 8-12 weeks with postcontrast MRI may be considered. Otherwise no aggressive osseous lesion. Multilevel spondylosis as detailed above. Electronically Signed   By: Primitivo Gauze M.D.   On: 05/11/2020 14:35   US BIOPSY (LIVER)  Result Date: 05/11/2020 INDICATION: Elevated alkaline phosphatase and liver transaminase levels. EXAM: ULTRASOUND GUIDED CORE BIOPSY OF LIVER MEDICATIONS: None. ANESTHESIA/SEDATION: Fentanyl 100 mcg IV; Versed 2.0 mg IV Moderate Sedation Time:  15 minutes. The patient was continuously monitored during the procedure by the interventional radiology nurse under my direct supervision. PROCEDURE: The procedure, risks, benefits, and alternatives were explained to the patient. Questions regarding the procedure were encouraged and answered. The patient understands and consents to the procedure. A time-out was performed prior to initiating the procedure. The liver was localized by ultrasound. The abdominal wall was prepped with chlorhexidine in a sterile fashion, and a sterile drape was applied covering the operative field. A sterile gown and sterile gloves were used for the procedure. Local anesthesia was provided with 1% Lidocaine. A 17 gauge trocar needle was advanced into the right lobe of the liver. Three separate coaxial 18 gauge core biopsy samples were obtained and submitted in formalin. Gel-Foam pledgets were advanced through the outer needle as the needle was retracted and removed. Additional ultrasound was performed. COMPLICATIONS: None immediate. FINDINGS: Solid core biopsy samples were  obtained from the liver parenchyma. There were no immediate bleeding  complications. IMPRESSION: Ultrasound-guided core biopsy performed of the liver within the right lobe parenchyma. Electronically Signed   By: Aletta Edouard M.D.   On: 05/11/2020 11:38   US Venous Img Lower Bilateral  Result Date: 07/27/2020 CLINICAL DATA:  Bilateral lower extremity swelling EXAM: BILATERAL LOWER EXTREMITY VENOUS DOPPLER ULTRASOUND TECHNIQUE: Gray-scale sonography with graded compression, as well as color Doppler and duplex ultrasound were performed to evaluate the lower extremity deep venous systems from the level of the common femoral vein and including the common femoral, femoral, profunda femoral, popliteal and calf veins including the posterior tibial, peroneal and gastrocnemius veins when visible. The superficial great saphenous vein was also interrogated. Spectral Doppler was utilized to evaluate flow at rest and with distal augmentation maneuvers in the common femoral, femoral and popliteal veins. COMPARISON:  None. FINDINGS: RIGHT LOWER EXTREMITY Common Femoral Vein: No evidence of thrombus. Normal compressibility, respiratory phasicity and response to augmentation. Saphenofemoral Junction: No evidence of thrombus. Normal compressibility and flow on color Doppler imaging. Profunda Femoral Vein: No evidence of thrombus. Normal compressibility and flow on color Doppler imaging. Femoral Vein: No evidence of thrombus. Normal compressibility, respiratory phasicity and response to augmentation. Popliteal Vein: No evidence of thrombus. Normal compressibility, respiratory phasicity and response to augmentation. Calf Veins: No evidence of thrombus. Normal compressibility and flow on color Doppler imaging. Other Findings:  Lower extremity subcutaneous edema noted LEFT LOWER EXTREMITY Common Femoral Vein: No evidence of thrombus. Normal compressibility, respiratory phasicity and response to augmentation. Saphenofemoral Junction:  No evidence of thrombus. Normal compressibility and flow on color Doppler imaging. Profunda Femoral Vein: No evidence of thrombus. Normal compressibility and flow on color Doppler imaging. Femoral Vein: No evidence of thrombus. Normal compressibility, respiratory phasicity and response to augmentation. Popliteal Vein: No evidence of thrombus. Normal compressibility, respiratory phasicity and response to augmentation. Calf Veins: No evidence of thrombus. Normal compressibility and flow on color Doppler imaging. Other Findings:  Lower extremity subcutaneous edema noted IMPRESSION: No evidence of deep venous thrombosis in either lower extremity. Electronically Signed   By: Jerilynn Mages.  Shick M.D.   On: 07/27/2020 14:28   CT BONE MARROW BIOPSY & ASPIRATION  Result Date: 06/09/2020 INDICATION: 75 year old female with a history of monoclonal gammopathy referred for bone marrow biopsy EXAM: CT BONE MARROW BIOPSY AND ASPIRATION MEDICATIONS: None. ANESTHESIA/SEDATION: Moderate (conscious) sedation was employed during this procedure. A total of Versed 1.0 mg and Fentanyl 50 mcg was administered intravenously. Moderate Sedation Time: 10 minutes. The patient's level of consciousness and vital signs were monitored continuously by radiology nursing throughout the procedure under my direct supervision. FLUOROSCOPY TIME:  CT COMPLICATIONS: None PROCEDURE: The procedure risks, benefits, and alternatives were explained to the patient. Questions regarding the procedure were encouraged and answered. The patient understands and consents to the procedure. Scout CT of the pelvis was performed for surgical planning purposes. The right posterior pelvis was prepped with Chlorhexidine in a sterile fashion, and a sterile drape was applied covering the operative field. A sterile gown and sterile gloves were used for the procedure. Local anesthesia was provided with 1% Lidocaine. Right posterior iliac bone was targeted for biopsy. The skin and  subcutaneous tissues were infiltrated with 1% lidocaine without epinephrine. A small stab incision was made with an 11 blade scalpel, and an 11 gauge Murphy needle was advanced with CT guidance to the posterior cortex. Manual forced was used to advance the needle through the posterior cortex and the stylet was removed. A bone marrow aspirate was retrieved and  passed to a cytotechnologist in the room. The Murphy needle was then advanced without the stylet for a core biopsy. The core biopsy was retrieved and also passed to a cytotechnologist. Manual pressure was used for hemostasis and a sterile dressing was placed. No complications were encountered no significant blood loss was encountered. Patient tolerated the procedure well and remained hemodynamically stable throughout. IMPRESSION: Status post CT-guided bone marrow biopsy, with tissue specimen sent to pathology for complete histopathologic analysis Signed, Dulcy Fanny. Earleen Newport, DO [facility] Electronically Signed   By: Corrie Mckusick D.O.   On: 06/09/2020 09:58   ECHOCARDIOGRAM LIMITED  Result Date: 06/01/2020    ECHOCARDIOGRAM LIMITED REPORT   Patient Name:   Nancy Rodriguez Date of Exam: 06/01/2020 Medical Rec #:  [id number]         Height:       63.5 in Accession #:    [id number]        Weight:       146.5 lb Date of Birth:  1945-05-11          BSA:          1.704 m Patient Age:    2 years          BP:           143/83 mmHg Patient Gender: F                 HR:           84 bpm. Exam Location:  [facility] Procedure: 2D Echo, Cardiac Doppler, Color Doppler and Strain Analysis Indications:     Amyloidosis- unspecified type  History:         Patient has no prior history of Echocardiogram examinations.  Sonographer:     Sherrie Sport RDCS (AE) Referring Phys:  [id number] Anton Cheramie Diagnosing Phys: Kate Sable MD  Sonographer Comments: Global longitudinal strain was attempted. IMPRESSIONS  1. Left ventricular  ejection fraction, by estimation, is 60 to 65%. The left ventricle has normal function. There is mild asymmetric left ventricular hypertrophy of the basal-septal segment. Left ventricular diastolic parameters are indeterminate. The average left ventricular global longitudinal strain is -16.8 %.  2. Right ventricular systolic function is normal.  3. The mitral valve is normal in structure. Mild mitral valve regurgitation.  4. The aortic valve is tricuspid. Aortic valve regurgitation is not visualized. Mild aortic valve sclerosis is present, with no evidence of aortic valve stenosis.  5. The inferior vena cava is normal in size with greater than 50% respiratory variability, suggesting right atrial pressure of 3 mmHg. Conclusion(s)/Recommendation(s): No echocardiographic findings to suggest cardiac amyloid on this study. FINDINGS  Left Ventricle: Left ventricular ejection fraction, by estimation, is 60 to 65%. The left ventricle has normal function. The average left ventricular global longitudinal strain is -16.8 %. The left ventricular internal cavity size was normal in size. There is mild asymmetric left ventricular hypertrophy of the basal-septal segment. Left ventricular diastolic parameters are indeterminate. Right Ventricle: No increase in right ventricular wall thickness. Right ventricular systolic function is normal. Left Atrium: Left atrial size was normal in size. Right Atrium: Right atrial size was normal in size. Pericardium: There is no evidence of pericardial effusion. Mitral Valve: The mitral valve is normal in structure. Mild mitral valve regurgitation. Tricuspid Valve: The tricuspid valve is normal in structure. Tricuspid valve regurgitation is not demonstrated. Aortic Valve: The aortic valve is tricuspid. Aortic valve regurgitation is not visualized.  Mild aortic valve sclerosis is present, with no evidence of aortic valve stenosis. Aortic valve mean gradient measures 5.0 mmHg. Aortic valve peak  gradient measures 8.2 mmHg. Aortic valve area, by VTI measures 1.58 cm. Pulmonic Valve: The pulmonic valve was normal in structure. Pulmonic valve regurgitation is trivial. Aorta: The aortic root is normal in size and structure. Venous: The inferior vena cava is normal in size with greater than 50% respiratory variability, suggesting right atrial pressure of 3 mmHg. LEFT VENTRICLE PLAX 2D LVIDd:         4.28 cm  Diastology LVIDs:         2.44 cm  LV e' medial:    5.66 cm/s LV PW:         1.72 cm  LV E/e' medial:  13.6 LV IVS:        1.01 cm  LV e' lateral:   5.11 cm/s LVOT diam:     2.00 cm  LV E/e' lateral: 15.1 LV SV:         49 LV SV Index:   29       2D Longitudinal Strain LVOT Area:     3.14 cm 2D Strain GLS Avg:     -16.8 %                          3D Volume EF:                         3D EF:        62 %                         LV EDV:       115 ml                         LV ESV:       44 ml                         LV SV:        71 ml RIGHT VENTRICLE RV Basal diam:  3.11 cm RV S prime:     10.10 cm/s TAPSE (M-mode): 3.1 cm LEFT ATRIUM             Index       RIGHT ATRIUM           Index LA diam:        3.80 cm 2.23 cm/m  RA Area:     13.60 cm LA Vol (A2C):   47.7 ml 27.99 ml/m RA Volume:   32.60 ml  19.13 ml/m LA Vol (A4C):   46.4 ml 27.23 ml/m LA Biplane Vol: 49.3 ml 28.93 ml/m  AORTIC VALVE                    PULMONIC VALVE AV Area (Vmax):    1.62 cm     PV Vmax:        0.65 m/s AV Area (Vmean):   1.56 cm     PV Peak grad:   1.7 mmHg AV Area (VTI):     1.58 cm     RVOT Peak grad: 2 mmHg AV Vmax:           143.00 cm/s AV Vmean:          104.667 cm/s  AV VTI:            0.309 m AV Peak Grad:      8.2 mmHg AV Mean Grad:      5.0 mmHg LVOT Vmax:         73.80 cm/s LVOT Vmean:        52.000 cm/s LVOT VTI:          0.156 m LVOT/AV VTI ratio: 0.50  AORTA Ao Root diam: 3.10 cm MITRAL VALVE               TRICUSPID VALVE MV Area (PHT): 4.54 cm    TR Peak grad:   11.2 mmHg MV Decel Time: 167 msec    TR Vmax:         167.00 cm/s MV E velocity: 77.10 cm/s MV A velocity: 72.00 cm/s  SHUNTS MV E/A ratio:  1.07        Systemic VTI:  0.16 m                            Systemic Diam: 2.00 cm Kate Sable MD Electronically signed by Kate Sable MD Signature Date/Time: 06/01/2020/4:06:38 PM    Final       ASSESSMENT & PLAN:  1. Light chain (AL) amyloidosis (HCC)   2. Encounter for antineoplastic chemotherapy   3. 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[place])   4. Transaminitis   5. Chemotherapy induced nausea and vomiting   6. Hypokalemia   7. Leg swelling    # AL-amyloidosis Diagnosis of AL amyloidosis was discussed with patient.  Patient has biopsy confirmed liver involvement, possible kidney involvement- Labs are reviewed and discussed with patient. Proceed with cycle 2 day 1 Dara-CyBorD. Transaminitis, hyperbilirubinemia.  Due to involvement of amyloidosis. Dose reduce Velcade to 0.7 mg/m, continue cyclophosphamide 500 mg orally today. P[facility] opinion [facility].  #Chemotherapy-induced nausea, likely secondary to the oral cyclophosphamide.  I added Emend and Aloxi to today's treatment. Continue home antiemetic regimen-Zofran, Compazine, Ativan as instructed.  #Hypokalemia, potassium level 3.7.  Continue potassium chloride 10 mEq daily.  #Chronic kidney disease, follow-up with nephrology.  Kidney function is worse today.  Probably due to nausea and a decrease oral intake. Patient will receive IV normal saline 500 cc x 1 today.  Encourage oral hydration.  #Anxiety, she has been recommended to take Remeron which she has not tried yet. #Bilateral lower extremity swelling, recommend lower extremity venous Doppler to rule out DVT.  Recommend patient to apply compression stocking, encourage leg elevation. Supportive care measures are necessary for patient well-being and will be provided as necessary. We spent sufficient time to discuss many aspect of care, questions were answered to  patient's satisfaction.  All questions were answered. The patient knows to call the clinic with any problems questions or concerns. Follow-up in 1 week. cc Leonel Ramsay, MD   [name], MD, PhD Hematology Oncology [facility] Pager- [phone] 07/27/2020

## 2020-07-27 NOTE — Progress Notes (Signed)
MD adding aloxi and emend to [employer]

## 2020-07-28 ENCOUNTER — Telehealth: Payer: Self-pay | Admitting: Licensed Clinical Social Worker

## 2020-07-28 NOTE — Telephone Encounter (Signed)
Called Nancy Rodriguez to confirm that she was interested in testing for hereditary amyloidosis and not hereditary cancer. We discussed that this condition can be hereditary but that she would need to see a different genetics provider, such as adult genetics, in order to have testing for this as we only test for hereditary cancer. She will discuss this with [facility] when she sees the providers there.

## 2020-07-29 ENCOUNTER — Inpatient Hospital Stay (HOSPITAL_BASED_OUTPATIENT_CLINIC_OR_DEPARTMENT_OTHER): Payer: Medicare Other | Admitting: Hospice and Palliative Medicine

## 2020-07-29 DIAGNOSIS — E859 Amyloidosis, unspecified: Secondary | ICD-10-CM | POA: Diagnosis not present

## 2020-07-29 DIAGNOSIS — N1831 Chronic kidney disease, stage 3a: Secondary | ICD-10-CM

## 2020-07-29 DIAGNOSIS — D631 Anemia in chronic kidney disease: Secondary | ICD-10-CM

## 2020-07-29 DIAGNOSIS — Z515 Encounter for palliative care: Secondary | ICD-10-CM

## 2020-07-29 DIAGNOSIS — F419 Anxiety disorder, unspecified: Secondary | ICD-10-CM | POA: Diagnosis not present

## 2020-07-29 DIAGNOSIS — F32A Depression, unspecified: Secondary | ICD-10-CM

## 2020-07-29 DIAGNOSIS — Z79899 Other long term (current) drug therapy: Secondary | ICD-10-CM

## 2020-07-29 DIAGNOSIS — D472 Monoclonal gammopathy: Secondary | ICD-10-CM

## 2020-07-29 DIAGNOSIS — R110 Nausea: Secondary | ICD-10-CM

## 2020-07-29 DIAGNOSIS — E1169 Type 2 diabetes mellitus with other specified complication: Secondary | ICD-10-CM

## 2020-07-29 DIAGNOSIS — R600 Localized edema: Secondary | ICD-10-CM

## 2020-07-29 MED ORDER — ONDANSETRON HCL 8 MG PO TABS: ORAL_TABLET | ORAL | 1 refills | Status: DC

## 2020-07-29 NOTE — Progress Notes (Signed)
Virtual Visit via Telephone Note  I connected with Nancy Rodriguez on 07/29/20 at  1:15 PM EDT by telephone and verified that I am speaking with the correct person using two identifiers.  Location: Patient: home Provider: clinic   I discussed the limitations, risks, security and privacy concerns of performing an evaluation and management service by telephone and the availability of in person appointments. I also discussed with the patient that there may be a patient responsible charge related to this service. The patient expressed understanding and agreed to proceed.   History of Present Illness: Nancy Rodriguez is a 75 y.o. female with multiple medical problems including monoclonal gammopathy, CKD stage 3b, anemia, RA, diabetes, who was recently diagnosed with amyloidosis.  Option for bone marrow biopsy was discussed but patient declined.  She was started on systemic chemotherapy.  Patient was referred to palliative care to help address goals and manage ongoing symptoms.   Observations/Objective: Patient was having difficulty utilizing MyChart for visit.  I called and spoke with her and her daughter by phone.  Patient endorses persistent lower extremity edema.  Lower extremity Dopplers were negative for DVT.  Most likely, edema is secondary to hypoalbuminemia.  Recommended conservative measures including elevating extremities and use of compression stockings.  Patient also continues to endorse depressive symptoms and anxiety.  I prescribed mirtazapine last month but she has not started this.  Dr. Yu prescribed lorazepam but she has also not taken it.  I discussed the role of mirtazapine as an antidepressant, which may also help some with anxiety, insomnia, and poor appetite.  Patient seemed more comfortable with this plan.  Patient was having nausea but reports that this is improved on antiemetics.  She says her oral and fluid intake are better.  She is pending nutrition  consultation.  Also called and spoke with her son by phone.  We discussed my conversation with patient and his sister.  Son is trying to expedite consultation by the amyloidosis specialist at [facility].  Assessment and Plan: Amyloidosis on Dara CyBorD.  Followed by Dr. Yu.  Patient and family are seeking care at [facility]  Anxiety/depression -start mirtazapine 7.5 mg nightly. Lorazepam as needed  Nausea -improved.  Refill ondansetron  Follow Up Instructions: Follow-up MyChart visit 1 to 2 months   I discussed the assessment and treatment plan with the patient. The patient was provided an opportunity to ask questions and all were answered. The patient agreed with the plan and demonstrated an understanding of the instructions.   The patient was advised to call back or seek an in-person evaluation if the symptoms worsen or if the condition fails to improve as anticipated.  I provided 30 minutes of non-face-to-face time during this encounter.    R , NP   

## 2020-08-02 ENCOUNTER — Inpatient Hospital Stay: Payer: Medicare Other | Admitting: Licensed Clinical Social Worker

## 2020-08-02 ENCOUNTER — Inpatient Hospital Stay: Payer: Medicare Other

## 2020-08-03 ENCOUNTER — Inpatient Hospital Stay (HOSPITAL_BASED_OUTPATIENT_CLINIC_OR_DEPARTMENT_OTHER): Payer: Medicare Other | Admitting: Oncology

## 2020-08-03 ENCOUNTER — Inpatient Hospital Stay: Payer: Medicare Other

## 2020-08-03 ENCOUNTER — Encounter: Payer: Self-pay | Admitting: Oncology

## 2020-08-03 ENCOUNTER — Other Ambulatory Visit: Payer: Self-pay

## 2020-08-03 VITALS — BP 140/60 | HR 78 | Resp 16

## 2020-08-03 VITALS — BP 134/67 | HR 83 | Temp 97.1°F | Resp 18 | Wt 161.0 lb

## 2020-08-03 DIAGNOSIS — T451X5D Adverse effect of antineoplastic and immunosuppressive drugs, subsequent encounter: Secondary | ICD-10-CM

## 2020-08-03 DIAGNOSIS — R112 Nausea with vomiting, unspecified: Secondary | ICD-10-CM

## 2020-08-03 DIAGNOSIS — F32A Depression, unspecified: Secondary | ICD-10-CM

## 2020-08-03 DIAGNOSIS — T451X5A Adverse effect of antineoplastic and immunosuppressive drugs, initial encounter: Secondary | ICD-10-CM

## 2020-08-03 DIAGNOSIS — E859 Amyloidosis, unspecified: Secondary | ICD-10-CM

## 2020-08-03 DIAGNOSIS — N1831 Chronic kidney disease, stage 3a: Secondary | ICD-10-CM | POA: Diagnosis not present

## 2020-08-03 DIAGNOSIS — Z5111 Encounter for antineoplastic chemotherapy: Secondary | ICD-10-CM

## 2020-08-03 DIAGNOSIS — E8581 Light chain (AL) amyloidosis: Secondary | ICD-10-CM

## 2020-08-03 DIAGNOSIS — Z5112 Encounter for antineoplastic immunotherapy: Secondary | ICD-10-CM | POA: Diagnosis not present

## 2020-08-03 DIAGNOSIS — E876 Hypokalemia: Secondary | ICD-10-CM

## 2020-08-03 DIAGNOSIS — R7401 Elevation of levels of liver transaminase levels: Secondary | ICD-10-CM

## 2020-08-03 DIAGNOSIS — D631 Anemia in chronic kidney disease: Secondary | ICD-10-CM

## 2020-08-03 LAB — COMPREHENSIVE METABOLIC PANEL
ALT: 54 U/L — ABNORMAL HIGH (ref 0–44)
AST: 87 U/L — ABNORMAL HIGH (ref 15–41)
Albumin: 1.9 g/dL — ABNORMAL LOW (ref 3.5–5.0)
Alkaline Phosphatase: 940 U/L — ABNORMAL HIGH (ref 38–126)
Anion gap: 9 (ref 5–15)
BUN: 21 mg/dL (ref 8–23)
CO2: 25 mmol/L (ref 22–32)
Calcium: 8.4 mg/dL — ABNORMAL LOW (ref 8.9–10.3)
Chloride: 103 mmol/L (ref 98–111)
Creatinine, Ser: 1.06 mg/dL — ABNORMAL HIGH (ref 0.44–1.00)
GFR, Estimated: 54 mL/min — ABNORMAL LOW (ref >60)
Glucose, Bld: 147 mg/dL — ABNORMAL HIGH (ref 70–99)
Potassium: 3.4 mmol/L — ABNORMAL LOW (ref 3.5–5.1)
Sodium: 137 mmol/L (ref 135–145)
Total Bilirubin: 2.3 mg/dL — ABNORMAL HIGH (ref 0.3–1.2)
Total Protein: 4.9 g/dL — ABNORMAL LOW (ref 6.5–8.1)

## 2020-08-03 LAB — CBC WITH DIFFERENTIAL/PLATELET
Abs Immature Granulocytes: 0.05 10*3/uL (ref 0.00–0.07)
Basophils Absolute: 0 10*3/uL (ref 0.0–0.1)
Basophils Relative: 0 %
Eosinophils Absolute: 0 10*3/uL (ref 0.0–0.5)
Eosinophils Relative: 1 %
HCT: 25.2 % — ABNORMAL LOW (ref 36.0–46.0)
Hemoglobin: 8.7 g/dL — ABNORMAL LOW (ref 12.0–15.0)
Immature Granulocytes: 1 %
Lymphocytes Relative: 29 %
Lymphs Abs: 1.5 10*3/uL (ref 0.7–4.0)
MCH: 25.4 pg — ABNORMAL LOW (ref 26.0–34.0)
MCHC: 34.5 g/dL (ref 30.0–36.0)
MCV: 73.7 fL — ABNORMAL LOW (ref 80.0–100.0)
Monocytes Absolute: 0.4 10*3/uL (ref 0.1–1.0)
Monocytes Relative: 9 %
Neutro Abs: 3.1 10*3/uL (ref 1.7–7.7)
Neutrophils Relative %: 60 %
Platelets: 359 10*3/uL (ref 150–400)
RBC: 3.42 MIL/uL — ABNORMAL LOW (ref 3.87–5.11)
RDW: 23.4 % — ABNORMAL HIGH (ref 11.5–15.5)
WBC: 5.1 10*3/uL (ref 4.0–10.5)
nRBC: 3.7 % — ABNORMAL HIGH (ref 0.0–0.2)

## 2020-08-03 MED ORDER — SODIUM CHLORIDE 0.9 % IV SOLN
Freq: Once | INTRAVENOUS | Status: AC
  Filled 2020-08-03: qty 250

## 2020-08-03 MED ORDER — DARATUMUMAB-HYALURONIDASE-FIHJ 1800-30000 MG-UT/15ML ~~LOC~~ SOLN
1800.0000 mg | Freq: Once | SUBCUTANEOUS | Status: AC
  Administered 2020-08-03: 1800 mg via SUBCUTANEOUS
  Filled 2020-08-03: qty 15

## 2020-08-03 MED ORDER — HEPARIN SOD (PORK) LOCK FLUSH 100 UNIT/ML IV SOLN
INTRAVENOUS | Status: AC
  Filled 2020-08-03: qty 5

## 2020-08-03 MED ORDER — ACETAMINOPHEN 325 MG PO TABS
650.0000 mg | ORAL_TABLET | Freq: Once | ORAL | Status: AC
  Administered 2020-08-03: 650 mg via ORAL
  Filled 2020-08-03: qty 2

## 2020-08-03 MED ORDER — DEXAMETHASONE 4 MG PO TABS
40.0000 mg | ORAL_TABLET | Freq: Once | ORAL | Status: AC
  Administered 2020-08-03: 40 mg via ORAL
  Filled 2020-08-03: qty 10

## 2020-08-03 MED ORDER — SODIUM CHLORIDE 0.9 % IV SOLN
150.0000 mg | Freq: Once | INTRAVENOUS | Status: AC
  Administered 2020-08-03: 150 mg via INTRAVENOUS
  Filled 2020-08-03: qty 150

## 2020-08-03 MED ORDER — PALONOSETRON HCL INJECTION 0.25 MG/5ML
0.2500 mg | Freq: Once | INTRAVENOUS | Status: AC
  Administered 2020-08-03: 0.25 mg via INTRAVENOUS
  Filled 2020-08-03: qty 5

## 2020-08-03 MED ORDER — BORTEZOMIB CHEMO SQ INJECTION 3.5 MG (2.5MG/ML)
0.7000 mg/m2 | Freq: Once | INTRAMUSCULAR | Status: AC
  Administered 2020-08-03: 1.25 mg via SUBCUTANEOUS
  Filled 2020-08-03: qty 0.5

## 2020-08-03 MED ORDER — DIPHENHYDRAMINE HCL 25 MG PO CAPS
50.0000 mg | ORAL_CAPSULE | Freq: Once | ORAL | Status: AC
  Administered 2020-08-03: 50 mg via ORAL
  Filled 2020-08-03: qty 2

## 2020-08-03 NOTE — Progress Notes (Signed)
Pt in for follow up reports new nausea med has helped greatly. Pt reports feeling more weak and tired.  Appetite good some days not so good other days.

## 2020-08-03 NOTE — Progress Notes (Signed)
Hematology/Oncology  Follow up note [facility] [phone] [phone]   Patient Care Team: Leonel Ramsay, MD as PCP - General (Infectious Diseases)  REFERRING PROVIDER: Leonel Ramsay, MD  CHIEF COMPLAINTS/REASON FOR VISIT:  Follow-up for amyloidosis  HISTORY OF PRESENTING ILLNESS:   Nancy Rodriguez is a  75 y.o.  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  Work up include protein electrophoresis showed M protein 0.7,   Reviewed her previous medical records via care everywhere.  She was seen by Hematology Oncology at [facility] on 02/14/2017.  07/25/2007  SPEP showed M protein of 0.35, IFE showed IgG lamda 09/22/2007  Bone survey negative.  02/15/14 IgG 930, SPEP M protien 0.22, free kappa light chain 3.59, lamda 2.92, ratio 1.23  Hypercalcemia, resolved after stopping HCTZ.  # Transaminitis 03/28/20 US abdomen showed left liver lobe Complex 1.7 cm cyst  # 04/07/20 MRI abdomen w/wo contrast showed 2 benign liver cysts, 2 nonspecific hypovascular irighr liver lesions, abnormal bone marrow signal of L1 vertebral body. Patient was  advised to proceed with bone marrow biopsy and she declined.  # referred her to established care with GI and was seen on 04/19/20,  # Liver biopsy showed amyloidosis. [place] MS/MS showed AL type # 05/20/20 recommend bone marrow biopsy which is scheduled on 05/24/2020.  Patient changed her mind and ask a biopsy to be canceled.  My team and I had multiple phone discussion with patient's daughter Nancy Rodriguez and later with the patient's son Nancy Rodriguez.  Patient agreed with bone marrow biopsy and a biopsy was scheduled on 06/09/2020.  05/20/20 NT proBNP  was normal,  TSH slightly increased, normal T4 Normal factor X Normal coags Troponin 19. Refer to  cardiology for evaluation. May need cardiac MRI  # 06/01/2020 2D echo result was reviewed and discussed with patient.  Patient has normal LVEF 60-65%. Mild asymmetric left ventricular hypertrophy.  Left ventricular diastolic parameters are indeterminate. average left ventricular global longitudinal strain is -16.8 %.  # 06/09/2020, bone marrow biopsy showed monoclonal plasmacytosis, 8%, amyloid deposit present.  Absent iron stores. 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 t (14;20) #06/17/2020, further discussed about diagnosis and treatment plan.  Patient declined bone marrow transplant evaluation.  Decision was made to proceed with Dara-CyborD chemotherapy treatments.  INTERVAL HISTORY Nancy Rodriguez is a 74 y.o. female who has above history reviewed by me today presents for follow up visit for management of AL amyloidosis Problems and complaints are listed below: Patient presents for the evaluation prior to Dara-CyborD treatment. Nausea has improved after adding additional premedication.  No nausea vomiting diarrhea.  Denies any neuropathy. She was accompanied by one of her daughters.  Patient also called her son Nancy Rodriguez who stayed connected throughout the entire encounter and asked questions.  Patient's daughter also requested to speak with me outside of patient's room.  She reports that patient has had mood swings lately.  Patient reports feeling depressed.  She has a diagnosis of major depression listed on her problem list-source from [facility].  She cannot recall details of the diagnosis.  Currently not on medication.  Patient's son Nancy Rodriguez reports that patient has had a depression disorder. Patient has been seen by palliative care service [facility] and was recommended to try  Remeron 7.5 mg at night.  Unclear if patient is on this medication or not. . Patient now has an appointment with [facility]ology for second opinion.  Review of Systems   Constitutional: Negative for appetite change, chills, fatigue and fever.  HENT:   Negative for hearing loss, nosebleeds and voice change.   Eyes: Negative for eye problems.  Respiratory: Negative for chest tightness and cough.   Cardiovascular: Negative for chest pain and leg swelling.  Gastrointestinal: Positive for nausea. Negative for abdominal distention, abdominal pain and blood in stool.  Endocrine: Negative for hot flashes.  Genitourinary: Negative for difficulty urinating and frequency.   Musculoskeletal: Negative for arthralgias.  Skin: Negative for rash.  Neurological: Negative for extremity weakness and numbness.  Hematological: Negative for adenopathy.  Psychiatric/Behavioral: Negative for confusion.    MEDICAL HISTORY:  Past Medical History:  Diagnosis Date  . Anemia    Anemia in chronic kidney disease  . Chronic kidney disease    Stage 3b chronic kidney disease  . Diabetes mellitus without complication ([place])   . Hypercholesterolemia   . Hypertension   . MGUS (monoclonal gammopathy of unknown significance)   . Osteoarthritis   . Rheumatoid arthritis ([place])     SURGICAL HISTORY: Past Surgical History:  Procedure Laterality Date  . HERNIA REPAIR    . PARATHYROIDECTOMY      SOCIAL HISTORY: Social History   Socioeconomic History  . Marital status: Widowed    Spouse name: Not on file  . Number of children: 6  . Years of education: Not on file  . Highest education level: Not on file  Occupational History  . Not on file  Tobacco Use  . Smoking status: Never Smoker  . Smokeless tobacco: Never Used  Substance and Sexual Activity  . Alcohol use: No  . Drug use: Never  . Sexual activity: Not on file  Other Topics Concern  . Not on file  Social History Narrative  . Not on file   Social Determinants of Health   Financial Resource Strain: Not on file  Food Insecurity: Not on file  Transportation Needs: Not on file  Physical Activity: Not on file   Stress: Not on file  Social Connections: Not on file  Intimate Partner Violence: Not on file    FAMILY HISTORY: Family History  Problem Relation Age of Onset  . Diabetes Daughter   . Diabetes Son   . Dementia Mother   . Cancer Father   . Esophageal cancer Sister   . Brain cancer Brother     ALLERGIES:  is allergic to benazepril, lisinopril, tolmetin, and nsaids.  MEDICATIONS:  Current Outpatient Medications  Medication Sig Dispense Refill  . acyclovir (ZOVIRAX) 400 MG tablet Take 1 tablet (400 mg total) by mouth 2 (two) times daily. 60 tablet 5  . amLODipine (NORVASC) 5 MG tablet Take by mouth.    . cyclophosphamide (CYTOXAN) 50 MG capsule TAKE 10 CAPSULES (500 MG TOTAL) BY MOUTH ONCE A WEEK. TAKE WITH BREAKFAST. 40 capsule 3  . docusate sodium (COLACE) 100 MG capsule Take 1 capsule by mouth as needed.    Marland Kitchen EPINEPHrine 0.3 mg/0.3 mL IJ SOAJ injection as needed    . insulin glargine (LANTUS SOLOSTAR) 100 UNIT/ML Solostar Pen Inject 12 Units into the skin 2 (two) times daily. Pt takes in morning and bedtime.    Marland Kitchen LORazepam (ATIVAN) 0.5 MG tablet Take 1 tablet (0.5 mg total) by mouth every 6 (six) hours as needed (nausea vomiting). Jess[place]  tablet 0  . metoprolol succinate (TOPROL-XL) 50 MG 24 hr tablet TAKE 1 TABLET (50 MG TOTAL) BY MOUTH DAILY IN THE MORNING    . mirtazapine (REMERON) 7.5 MG tablet Take 1 tablet (7.5 mg total) by mouth at bedtime. 30 tablet 2  . ondansetron (ZOFRAN) 8 MG tablet Take 8 mg by mouth 30 to 60 min prior to Cytoxan administration then take 8 mg twice daily as needed for nausea and vomiting. 30 tablet 1  . potassium chloride (KLOR-CON) 10 MEQ tablet Take 1 tablet (10 mEq total) by mouth daily. 30 tablet 0  . prochlorperazine (COMPAZINE) 10 MG tablet Take 1 tablet (10 mg total) by mouth every 6 (six) hours as needed for nausea or vomiting. 60 tablet 1  . traMADol (ULTRAM) 50 MG tablet Take 1 tablet (50 mg total) by mouth every 6 (six) hours as needed for severe  pain. 8 tablet 0  . albuterol (VENTOLIN HFA) 108 (90 Base) MCG/ACT inhaler Inhale 2 puffs into the lungs every 4 (four) hours as needed. (Patient not taking: No sig reported)     No current facility-administered medications for this visit.     PHYSICAL EXAMINATION: ECOG PERFORMANCE STATUS: 1 - Symptomatic but completely ambulatory Vitals:   08/03/20 0946  BP: 134/67  Pulse: 83  Resp: 18  Temp: (!) 97.1 F (36.2 C)  SpO2: 97%   Filed Weights   08/03/20 0946  Weight: 161 lb (73 kg)    Physical Exam Constitutional:      General: She is not in acute distress. HENT:     Head: Normocephalic and atraumatic.  Eyes:     General: No scleral icterus. Cardiovascular:     Rate and Rhythm: Normal rate and regular rhythm.     Heart sounds: Normal heart sounds.  Pulmonary:     Effort: Pulmonary effort is normal. No respiratory distress.     Breath sounds: No wheezing.  Abdominal:     General: Bowel sounds are normal. There is no distension.     Palpations: Abdomen is soft.  Musculoskeletal:        General: Swelling present. No deformity. Normal range of motion.     Cervical back: Normal range of motion and neck supple.     Comments: Bilateral lower extremity 1+ edema  Skin:    General: Skin is warm and dry.     Findings: No erythema or rash.  Neurological:     Mental Status: She is alert and oriented to person, place, and time. Mental status is at baseline.     Cranial Nerves: No cranial nerve deficit.     Coordination: Coordination normal.  Psychiatric:        Mood and Affect: Mood normal.     LABORATORY DATA:  I have reviewed the data as listed Lab Results  Component Value Date   WBC 5.1 08/03/2020   HGB 8.7 (L) 08/03/2020   HCT 25.2 (L) 08/03/2020   MCV 73.7 (L) 08/03/2020   PLT 359 08/03/2020   Recent Labs    09/02/19 1608 03/16/20 1124 07/20/20 0914 07/27/20 0914 08/03/20 0858  NA 138   < > 137 136 137  K 3.6   < > 3.4* 3.7 3.4*  CL 100   < > 103 101 103   CO2 29   < > $R'27 27 25  'fE$ GLUCOSE 216*   < > 159* 174* 147*  BUN 24*   < > $R'14 19 21  'Vs$ CREATININE 1.15*   < >  0.93 1.43* 1.06*  CALCIUM 9.4   < > 8.3* 8.4* 8.4*  GFRNONAA 47*   < > >60 38* 54*  GFRAA 54*  --   --   --   --   PROT 8.4*   < > 5.3* 5.4* 4.9*  ALBUMIN 3.9   < > 1.9* 2.0* 1.9*  AST 31   < > 72* 62* 87*  ALT 27   < > 45* 40 54*  ALKPHOS 170*   < > 991* 997* 940*  BILITOT 1.1   < > 2.1* 2.3* 2.3*   < > = values in this interval not displayed.   Iron/TIBC/Ferritin/ %Sat    Component Value Date/Time   IRON 45 09/02/2019 1608   TIBC 416 09/02/2019 1608   FERRITIN 35 09/02/2019 1608   IRONPCTSAT 11 09/02/2019 1608     08/25/2019, platelet count 491, WBC 7.5, hemoglobin 12 Creatinine 1.58, EGFR 37, calcium 10.8, albumin 4.2 Negative hepatitis B surface antigen, hepatitis B core antibody, Negative hepatitis C 08/05/2019, A1c 11.2   RADIOGRAPHIC STUDIES: I have personally reviewed the radiological images as listed and agreed with the findings in the report. MR Lumbar Spine W Wo Contrast  Result Date: 05/11/2020 CLINICAL DATA:  bone lesion on MRI abd EXAM: MRI LUMBAR SPINE WITHOUT AND WITH CONTRAST TECHNIQUE: Multiplanar and multiecho pulse sequences of the lumbar spine were obtained without and with intravenous contrast. CONTRAST:  59mL GADAVIST GADOBUTROL 1 MMOL/ML IV SOLN COMPARISON:  04/07/2020. FINDINGS: Segmentation:  Standard. Alignment:  Minimal grade 1 L4-5 anterolisthesis. Vertebrae: Vertebral body heights are preserved. Mild Modic type 2 endplate degenerative changes. T1/T2 heterogenous signal involving the entirety of the L1 vertebral body with associated STIR hyperintensity and heterogenous enhancement. No other lesion identified. Conus medullaris and cauda equina: Conus extends to the L2 level. Conus and cauda equina appear normal. Disc levels: Multilevel desiccation. T12-L1: Small central protrusion. Patent spinal canal and neural foramen. L1-2: Minimal disc bulge and  bilateral facet degenerative spurring. Patent spinal canal and neural foramen. L2-3: Minimal disc bulge with superimposed bilateral extraforaminal protrusions. Facet degenerative spurring. Patent spinal canal and neural foramen. L3-4: Disc bulge with superimposed left foraminal protrusion. Facet degenerative spurring. Patent spinal canal. Mild bilateral neural foraminal narrowing. L4-5: Uncovered bulge with small superimposed left subarticular protrusion. Bilateral facet hypertrophy and ligamentum flavum thickening. Mild spinal canal and bilateral neural foraminal narrowing. L5-S1: Disc bulge with shallow central protrusion. Bilateral facet degenerative spurring. Patent spinal canal. Mild bilateral neural foraminal narrowing. Paraspinal and other soft tissues: Negative. 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 If suspicion persists short-term interval follow-up in 8-12 weeks with postcontrast MRI may be considered. Otherwise no aggressive osseous lesion. Multilevel spondylosis as detailed above. Electronically Signed   By: Primitivo Gauze M.D.   On: 05/11/2020 14:35   US BIOPSY (LIVER)  Result Date: 05/11/2020 INDICATION: Elevated alkaline phosphatase and liver transaminase levels. EXAM: ULTRASOUND GUIDED CORE BIOPSY OF LIVER MEDICATIONS: None. ANESTHESIA/SEDATION: Fentanyl 100 mcg IV; Versed 2.0 mg IV Moderate Sedation Time:  15 minutes. The patient was continuously monitored during the procedure by the interventional radiology nurse under my direct supervision. PROCEDURE: The procedure, risks, benefits, and alternatives were explained to the patient. Questions regarding the procedure were encouraged and answered. The patient understands and consents to the procedure. A time-out was performed prior to initiating the procedure. The liver was localized by ultrasound. The abdominal wall was prepped with chlorhexidine in a sterile fashion, and a sterile drape was applied  covering the operative field. A sterile gown and sterile gloves were used for the procedure. Local anesthesia was provided with 1% Lidocaine. A 17 gauge trocar needle was advanced into the right lobe of the liver. Three separate coaxial 18 gauge core biopsy samples were obtained and submitted in formalin. Gel-Foam pledgets were advanced through the outer needle as the needle was retracted and removed. Additional ultrasound was performed. COMPLICATIONS: None immediate. FINDINGS: Solid core biopsy samples were obtained from the liver parenchyma. There were no immediate bleeding complications. IMPRESSION: Ultrasound-guided core biopsy performed of the liver within the right lobe parenchyma. Electronically Signed   By: Aletta Edouard M.D.   On: 05/11/2020 11:38   US Venous Img Lower Bilateral  Result Date: 07/27/2020 CLINICAL DATA:  Bilateral lower extremity swelling EXAM: BILATERAL LOWER EXTREMITY VENOUS DOPPLER ULTRASOUND TECHNIQUE: Gray-scale sonography with graded compression, as well as color Doppler and duplex ultrasound were performed to evaluate the lower extremity deep venous systems from the level of the common femoral vein and including the common femoral, femoral, profunda femoral, popliteal and calf veins including the posterior tibial, peroneal and gastrocnemius veins when visible. The superficial great saphenous vein was also interrogated. Spectral Doppler was utilized to evaluate flow at rest and with distal augmentation maneuvers in the common femoral, femoral and popliteal veins. COMPARISON:  None. FINDINGS: RIGHT LOWER EXTREMITY Common Femoral Vein: No evidence of thrombus. Normal compressibility, respiratory phasicity and response to augmentation. Saphenofemoral Junction: No evidence of thrombus. Normal compressibility and flow on color Doppler imaging. Profunda Femoral Vein: No evidence of thrombus. Normal compressibility and flow on color Doppler imaging. Femoral Vein: No evidence of thrombus.  Normal compressibility, respiratory phasicity and response to augmentation. Popliteal Vein: No evidence of thrombus. Normal compressibility, respiratory phasicity and response to augmentation. Calf Veins: No evidence of thrombus. Normal compressibility and flow on color Doppler imaging. Other Findings:  Lower extremity subcutaneous edema noted LEFT LOWER EXTREMITY Common Femoral Vein: No evidence of thrombus. Normal compressibility, respiratory phasicity and response to augmentation. Saphenofemoral Junction: No evidence of thrombus. Normal compressibility and flow on color Doppler imaging. Profunda Femoral Vein: No evidence of thrombus. Normal compressibility and flow on color Doppler imaging. Femoral Vein: No evidence of thrombus. Normal compressibility, respiratory phasicity and response to augmentation. Popliteal Vein: No evidence of thrombus. Normal compressibility, respiratory phasicity and response to augmentation. Calf Veins: No evidence of thrombus. Normal compressibility and flow on color Doppler imaging. Other Findings:  Lower extremity subcutaneous edema noted IMPRESSION: No evidence of deep venous thrombosis in either lower extremity. Electronically Signed   By: Jerilynn Mages.  Shick M.D.   On: 07/27/2020 14:28   CT BONE MARROW BIOPSY & ASPIRATION  Result Date: 06/09/2020 INDICATION: 75 year old female with a history of monoclonal gammopathy referred for bone marrow biopsy EXAM: CT BONE MARROW BIOPSY AND ASPIRATION MEDICATIONS: None. ANESTHESIA/SEDATION: Moderate (conscious) sedation was employed during this procedure. A total of Versed 1.0 mg and Fentanyl 50 mcg was administered intravenously. Moderate Sedation Time: 10 minutes. The patient's level of consciousness and vital signs were monitored continuously by radiology nursing throughout the procedure under my direct supervision. FLUOROSCOPY TIME:  CT COMPLICATIONS: None PROCEDURE: The procedure risks, benefits, and alternatives were explained to the patient.  Questions regarding the procedure were encouraged and answered. The patient understands and consents to the procedure. Scout CT of the pelvis was performed for surgical planning purposes. The right posterior pelvis was prepped with Chlorhexidine in a sterile fashion, and a sterile drape was applied covering the operative field.  A sterile gown and sterile gloves were used for the procedure. Local anesthesia was provided with 1% Lidocaine. Right posterior iliac bone was targeted for biopsy. The skin and subcutaneous tissues were infiltrated with 1% lidocaine without epinephrine. A small stab incision was made with an 11 blade scalpel, and an 11 gauge Murphy needle was advanced with CT guidance to the posterior cortex. Manual forced was used to advance the needle through the posterior cortex and the stylet was removed. A bone marrow aspirate was retrieved and passed to a cytotechnologist in the room. The Murphy needle was then advanced without the stylet for a core biopsy. The core biopsy was retrieved and also passed to a cytotechnologist. Manual pressure was used for hemostasis and a sterile dressing was placed. No complications were encountered no significant blood loss was encountered. Patient tolerated the procedure well and remained hemodynamically stable throughout. IMPRESSION: Status post CT-guided bone marrow biopsy, with tissue specimen sent to pathology for complete histopathologic analysis Signed, Dulcy Fanny. Earleen Newport, DO [facility] Electronically Signed   By: Corrie Mckusick D.O.   On: 06/09/2020 09:58   ECHOCARDIOGRAM LIMITED  Result Date: 06/01/2020    ECHOCARDIOGRAM LIMITED REPORT   Patient Name:   Nancy Rodriguez Date of Exam: 06/01/2020 Medical Rec #:  [id number]         Height:       63.5 in Accession #:    [id number]        Weight:       146.5 lb Date of Birth:  03/29/1946          BSA:          1.704 m Patient Age:    80 years          BP:            143/83 mmHg Patient Gender: F                 HR:           84 bpm. Exam Location:  [facility] Procedure: 2D Echo, Cardiac Doppler, Color Doppler and Strain Analysis Indications:     Amyloidosis- unspecified type  History:         Patient has no prior history of Echocardiogram examinations.  Sonographer:     Sherrie Sport RDCS (AE) Referring Phys:  [id number] Dekker Verga Diagnosing Phys: Kate Sable MD  Sonographer Comments: Global longitudinal strain was attempted. IMPRESSIONS  1. Left ventricular ejection fraction, by estimation, is 60 to 65%. The left ventricle has normal function. There is mild asymmetric left ventricular hypertrophy of the basal-septal segment. Left ventricular diastolic parameters are indeterminate. The average left ventricular global longitudinal strain is -16.8 %.  2. Right ventricular systolic function is normal.  3. The mitral valve is normal in structure. Mild mitral valve regurgitation.  4. The aortic valve is tricuspid. Aortic valve regurgitation is not visualized. Mild aortic valve sclerosis is present, with no evidence of aortic valve stenosis.  5. The inferior vena cava is normal in size with greater than 50% respiratory variability, suggesting right atrial pressure of 3 mmHg. Conclusion(s)/Recommendation(s): No echocardiographic findings to suggest cardiac amyloid on this study. FINDINGS  Left Ventricle: Left ventricular ejection fraction, by estimation, is 60 to 65%. The left ventricle has normal function. The average left ventricular global longitudinal strain is -16.8 %. The left ventricular internal cavity size was normal in size. There is mild asymmetric left ventricular hypertrophy of the basal-septal segment.  Left ventricular diastolic parameters are indeterminate. Right Ventricle: No increase in right ventricular wall thickness. Right ventricular systolic function is normal. Left Atrium: Left atrial size was normal in size. Right Atrium: Right atrial size was normal in size.  Pericardium: There is no evidence of pericardial effusion. Mitral Valve: The mitral valve is normal in structure. Mild mitral valve regurgitation. Tricuspid Valve: The tricuspid valve is normal in structure. Tricuspid valve regurgitation is not demonstrated. Aortic Valve: The aortic valve is tricuspid. Aortic valve regurgitation is not visualized. Mild aortic valve sclerosis is present, with no evidence of aortic valve stenosis. Aortic valve mean gradient measures 5.0 mmHg. Aortic valve peak gradient measures 8.2 mmHg. Aortic valve area, by VTI measures 1.58 cm. Pulmonic Valve: The pulmonic valve was normal in structure. Pulmonic valve regurgitation is trivial. Aorta: The aortic root is normal in size and structure. Venous: The inferior vena cava is normal in size with greater than 50% respiratory variability, suggesting right atrial pressure of 3 mmHg. LEFT VENTRICLE PLAX 2D LVIDd:         4.28 cm  Diastology LVIDs:         2.44 cm  LV e' medial:    5.66 cm/s LV PW:         1.72 cm  LV E/e' medial:  13.6 LV IVS:        1.01 cm  LV e' lateral:   5.11 cm/s LVOT diam:     2.00 cm  LV E/e' lateral: 15.1 LV SV:         49 LV SV Index:   29       2D Longitudinal Strain LVOT Area:     3.14 cm 2D Strain GLS Avg:     -16.8 %                          3D Volume EF:                         3D EF:        62 %                         LV EDV:       115 ml                         LV ESV:       44 ml                         LV SV:        71 ml RIGHT VENTRICLE RV Basal diam:  3.11 cm RV S prime:     10.10 cm/s TAPSE (M-mode): 3.1 cm LEFT ATRIUM             Index       RIGHT ATRIUM           Index LA diam:        3.80 cm 2.23 cm/m  RA Area:     13.60 cm LA Vol (A2C):   47.7 ml 27.99 ml/m RA Volume:   32.60 ml  19.13 ml/m LA Vol (A4C):   46.4 ml 27.23 ml/m LA Biplane Vol: 49.3 ml 28.93 ml/m  AORTIC VALVE  PULMONIC VALVE AV Area (Vmax):    1.62 cm     PV Vmax:        0.65 m/s AV Area (Vmean):   1.56 cm      PV Peak grad:   1.7 mmHg AV Area (VTI):     1.58 cm     RVOT Peak grad: 2 mmHg AV Vmax:           143.00 cm/s AV Vmean:          104.667 cm/s AV VTI:            0.309 m AV Peak Grad:      8.2 mmHg AV Mean Grad:      5.0 mmHg LVOT Vmax:         73.80 cm/s LVOT Vmean:        52.000 cm/s LVOT VTI:          0.156 m LVOT/AV VTI ratio: 0.50  AORTA Ao Root diam: 3.10 cm MITRAL VALVE               TRICUSPID VALVE MV Area (PHT): 4.54 cm    TR Peak grad:   11.2 mmHg MV Decel Time: 167 msec    TR Vmax:        167.00 cm/s MV E velocity: 77.10 cm/s MV A velocity: 72.00 cm/s  SHUNTS MV E/A ratio:  1.07        Systemic VTI:  0.16 m                            Systemic Diam: 2.00 cm Debbe Odea MD Electronically signed by Debbe Odea MD Signature Date/Time: 06/01/2020/4:06:38 PM    Final       ASSESSMENT & PLAN:  1. Light chain (AL) amyloidosis (HCC)   2. Encounter for antineoplastic chemotherapy   3. 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HCC)   4. Transaminitis   5. Chemotherapy induced nausea and vomiting   6. Hypokalemia   7. 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  Patient has biopsy confirmed liver involvement, possible kidney involvement- Labs are reviewed and discussed with patient. Proceed with cycle 2 day 8 Dara-CyBorD. Transaminitis, hyperbilirubinemia.  Due to involvement of amyloidosis. Dose reduced Velcade to 0.7 mg/m, proceed with cyclophosphamide 500 mg orally today. Pending [facility] opinion at [facility].  #Chemotherapy-induced nausea, likely secondary to the oral cyclophosphamide.  Continue Emend and Aloxi as premed.   Continue home antiemetic regimen-Zofran, Compazine, Ativan as instructed.  #Hypokalemia, potassium level 3.4.  Continue potassium chloride 10 mEq daily.  #Chronic kidney disease, follow-up with nephrology.  Kidney function is better  #Anxiety, she has been recommended to take  Remeron #History of major depression listed in outside problem list.  previous diagnosis and treatment details are not available to me in EMR   refer to psychiatrist. #Bilateral lower extremity swelling, negative lower extremity venous Doppler to rule out DVT.  Recommend patient to apply compression stocking, encourage leg elevation.  Supportive care measures are necessary for patient well-being and will be provided as necessary. We spent sufficient time to discuss many aspect of care, questions were answered to patient's satisfaction.  All questions were answered. The patient knows to call the clinic with any problems questions or concerns. Above plans were discussed with patient's son over the phone.  Follow-up in 1 week. cc Mick Sell, MD  [name], MD, PhD Hematology Oncology [facility] at [facility] Pager- [phone] 08/03/2020

## 2020-08-03 NOTE — Progress Notes (Signed)
OK to proceed with Velcade and Darzalex with bili 2.3, AST 87 per Dr. Tasia Catchings

## 2020-08-09 ENCOUNTER — Inpatient Hospital Stay: Payer: Medicare Other

## 2020-08-09 NOTE — Progress Notes (Signed)
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  Past medical history of CKD stage 3, anemia, RA, DM. Patient receiving palliative chemotherapy.   Met with patient and daughter in clinic today.  Patient reports that appetite was decreased but has increase recently.  Previously having issues with nausea but premedications have been adjusted and she denies nausea at this time.  Son has been ordering meals for patient. Patient reports feeling weak and sometimes hard to prepare meals.  For breakfast eats boiled egg with spinach beets and drinks water and pedialyte.  Lunch is chicken, spinach, beets and supper is similar to lunch but may have salmon.  Likes granola.  Drinks boost shakes and tries to drink the glucose controlled shake.  Also, had chicken parmesan recently and chicken wings.  Reports bowel movement about every other day.  Denies problems chewing or swallowing.      Medications: lantus, remeron, colace, ativan, zofran   Labs: reviewed   Anthropometrics:   Height: 63.5 inches Weight: 163 lb 3oz  Stable weight recently with weight gain since Feb 2022 BMI: 28   Estimated Energy Needs  Kcals: 1600-1850 Protein: 80-93 g Fluid: 1600-1871ml/d   NUTRITION DIAGNOSIS: Food and nutrition related knowledge deficit related to cancer diagnosis as evidenced by questions regarding proper nutrition during treatment   INTERVENTION:  Discussed nutrition during cancer treatment and provided handout. With good appetite and stable weight encouraged well balanced diet of lean proteins and plant foods.   Patient with questions regarding low sugar shake options and provided written list along with coupons Discussed ways to increase fluid in diet and improve the taste of water.  Contact number given    MONITORING, EVALUATION, GOAL: weight trends, intake   Next  Visit: to be determined with treatment  Natacia Chaisson B. Zenia Resides, [place], [place] Registered Dietitian [phone] (mobile)

## 2020-08-10 ENCOUNTER — Other Ambulatory Visit: Payer: Self-pay

## 2020-08-10 ENCOUNTER — Encounter: Payer: Self-pay | Admitting: Oncology

## 2020-08-10 ENCOUNTER — Inpatient Hospital Stay: Payer: Medicare Other

## 2020-08-10 ENCOUNTER — Inpatient Hospital Stay (HOSPITAL_BASED_OUTPATIENT_CLINIC_OR_DEPARTMENT_OTHER): Payer: Medicare Other | Admitting: Oncology

## 2020-08-10 VITALS — BP 148/73 | HR 76 | Resp 16

## 2020-08-10 VITALS — BP 145/64 | HR 79 | Temp 97.7°F | Resp 18 | Wt 163.2 lb

## 2020-08-10 DIAGNOSIS — E8581 Light chain (AL) amyloidosis: Secondary | ICD-10-CM | POA: Diagnosis not present

## 2020-08-10 DIAGNOSIS — T451X5A Adverse effect of antineoplastic and immunosuppressive drugs, initial encounter: Secondary | ICD-10-CM

## 2020-08-10 DIAGNOSIS — E859 Amyloidosis, unspecified: Secondary | ICD-10-CM

## 2020-08-10 DIAGNOSIS — E876 Hypokalemia: Secondary | ICD-10-CM

## 2020-08-10 DIAGNOSIS — Z5111 Encounter for antineoplastic chemotherapy: Secondary | ICD-10-CM | POA: Diagnosis not present

## 2020-08-10 DIAGNOSIS — R7401 Elevation of levels of liver transaminase levels: Secondary | ICD-10-CM

## 2020-08-10 DIAGNOSIS — R112 Nausea with vomiting, unspecified: Secondary | ICD-10-CM

## 2020-08-10 DIAGNOSIS — D631 Anemia in chronic kidney disease: Secondary | ICD-10-CM

## 2020-08-10 DIAGNOSIS — N1831 Chronic kidney disease, stage 3a: Secondary | ICD-10-CM | POA: Diagnosis not present

## 2020-08-10 DIAGNOSIS — Z5112 Encounter for antineoplastic immunotherapy: Secondary | ICD-10-CM | POA: Diagnosis not present

## 2020-08-10 DIAGNOSIS — F32A Depression, unspecified: Secondary | ICD-10-CM

## 2020-08-10 LAB — COMPREHENSIVE METABOLIC PANEL
ALT: 52 U/L — ABNORMAL HIGH (ref 0–44)
AST: 82 U/L — ABNORMAL HIGH (ref 15–41)
Albumin: 1.8 g/dL — ABNORMAL LOW (ref 3.5–5.0)
Alkaline Phosphatase: 1108 U/L — ABNORMAL HIGH (ref 38–126)
Anion gap: 8 (ref 5–15)
BUN: 13 mg/dL (ref 8–23)
CO2: 24 mmol/L (ref 22–32)
Calcium: 8.2 mg/dL — ABNORMAL LOW (ref 8.9–10.3)
Chloride: 101 mmol/L (ref 98–111)
Creatinine, Ser: 0.89 mg/dL (ref 0.44–1.00)
GFR, Estimated: >60 mL/min (ref >60)
Glucose, Bld: 198 mg/dL — ABNORMAL HIGH (ref 70–99)
Potassium: 3.9 mmol/L (ref 3.5–5.1)
Sodium: 133 mmol/L — ABNORMAL LOW (ref 135–145)
Total Bilirubin: 3 mg/dL — ABNORMAL HIGH (ref 0.3–1.2)
Total Protein: 5 g/dL — ABNORMAL LOW (ref 6.5–8.1)

## 2020-08-10 LAB — CBC WITH DIFFERENTIAL/PLATELET
Abs Immature Granulocytes: 0.05 10*3/uL (ref 0.00–0.07)
Basophils Absolute: 0 10*3/uL (ref 0.0–0.1)
Basophils Relative: 1 %
Eosinophils Absolute: 0.1 10*3/uL (ref 0.0–0.5)
Eosinophils Relative: 2 %
HCT: 24.7 % — ABNORMAL LOW (ref 36.0–46.0)
Hemoglobin: 8.6 g/dL — ABNORMAL LOW (ref 12.0–15.0)
Immature Granulocytes: 1 %
Lymphocytes Relative: 24 %
Lymphs Abs: 1.1 10*3/uL (ref 0.7–4.0)
MCH: 25.6 pg — ABNORMAL LOW (ref 26.0–34.0)
MCHC: 34.8 g/dL (ref 30.0–36.0)
MCV: 73.5 fL — ABNORMAL LOW (ref 80.0–100.0)
Monocytes Absolute: 0.4 10*3/uL (ref 0.1–1.0)
Monocytes Relative: 8 %
Neutro Abs: 3 10*3/uL (ref 1.7–7.7)
Neutrophils Relative %: 64 %
Platelets: 295 10*3/uL (ref 150–400)
RBC: 3.36 MIL/uL — ABNORMAL LOW (ref 3.87–5.11)
RDW: 24 % — ABNORMAL HIGH (ref 11.5–15.5)
WBC: 4.7 10*3/uL (ref 4.0–10.5)
nRBC: 1.1 % — ABNORMAL HIGH (ref 0.0–0.2)

## 2020-08-10 MED ORDER — BORTEZOMIB CHEMO SQ INJECTION 3.5 MG (2.5MG/ML)
0.7000 mg/m2 | Freq: Once | INTRAMUSCULAR | Status: AC
  Administered 2020-08-10: 1.25 mg via SUBCUTANEOUS
  Filled 2020-08-10: qty 0.5

## 2020-08-10 MED ORDER — SODIUM CHLORIDE 0.9 % IV SOLN
150.0000 mg | Freq: Once | INTRAVENOUS | Status: AC
  Administered 2020-08-10: 150 mg via INTRAVENOUS
  Filled 2020-08-10: qty 150

## 2020-08-10 MED ORDER — DIPHENHYDRAMINE HCL 25 MG PO CAPS
50.0000 mg | ORAL_CAPSULE | Freq: Once | ORAL | Status: AC
Start: 2020-08-10 — End: 2020-08-10
  Administered 2020-08-10: 50 mg via ORAL
  Filled 2020-08-10: qty 2

## 2020-08-10 MED ORDER — ACETAMINOPHEN 325 MG PO TABS
650.0000 mg | ORAL_TABLET | Freq: Once | ORAL | Status: AC
  Administered 2020-08-10: 650 mg via ORAL
  Filled 2020-08-10: qty 2

## 2020-08-10 MED ORDER — DEXAMETHASONE 4 MG PO TABS
40.0000 mg | ORAL_TABLET | Freq: Once | ORAL | Status: AC
  Administered 2020-08-10: 40 mg via ORAL
  Filled 2020-08-10: qty 10

## 2020-08-10 MED ORDER — DARATUMUMAB-HYALURONIDASE-FIHJ 1800-30000 MG-UT/15ML ~~LOC~~ SOLN
1800.0000 mg | Freq: Once | SUBCUTANEOUS | Status: AC
  Administered 2020-08-10: 1800 mg via SUBCUTANEOUS
  Filled 2020-08-10: qty 15

## 2020-08-10 MED ORDER — PALONOSETRON HCL INJECTION 0.25 MG/5ML
0.2500 mg | Freq: Once | INTRAVENOUS | Status: AC
  Administered 2020-08-10: 0.25 mg via INTRAVENOUS
  Filled 2020-08-10: qty 5

## 2020-08-10 MED ORDER — SODIUM CHLORIDE 0.9 % IV SOLN
Freq: Once | INTRAVENOUS | Status: AC
  Filled 2020-08-10: qty 250

## 2020-08-10 NOTE — Progress Notes (Signed)
Bili 3.0, AST 82, Alk Phos 1108. Per Dr. Tasia Catchings , okay to proceed with treatment. Also, per Dr. Tasia Catchings, patient is to be monitored x 15 minutes post Darzalex injection today and with all future injections.   Patient monitored x 15 minutes post injection per Dr. Collie Siad request. Patient tolerated well. Discharged home.

## 2020-08-10 NOTE — Progress Notes (Signed)
Pt here for follow up. No new concerns voiced.   

## 2020-08-10 NOTE — Patient Instructions (Signed)
[facility]  Discharge Instructions: Thank you for choosing [facility] to provide your oncology and hematology care.  If you have a lab appointment with the [facility], please go directly to the [facility] and check in at the registration area.  Wear comfortable clothing and clothing appropriate for easy access to any Portacath or PICC line.   We strive to give you quality time with your provider. You may need to reschedule your appointment if you arrive late (15 or more minutes).  Arriving late affects you and other patients whose appointments are after yours.  Also, if you miss three or more appointments without notifying the office, you may be dismissed from the clinic at the provider's discretion.      For prescription refill requests, have your pharmacy contact our office and allow 72 hours for refills to be completed.    Today you received the following chemotherapy and/or immunotherapy agents Darzalex & VelcadeDaratumumab injection What is this medicine? DARATUMUMAB (dar a toom ue mab) is a monoclonal antibody. It is used to treat multiple myeloma. This medicine may be used for other purposes; ask your health care provider or pharmacist if you have questions. COMMON BRAND NAME(S): DARZALEX What should I tell my health care provider before I take this medicine? They need to know if you have any of these conditions:  hereditary fructose intolerance  infection (especially a virus infection such as chickenpox, herpes, or hepatitis B virus)  lung or breathing disease (asthma, COPD)  an unusual or allergic reaction to daratumumab, sorbitol, other medicines, foods, dyes, or preservatives  pregnant or trying to get pregnant  breast-feeding How should I use this medicine? This medicine is for infusion into a vein. It is given by a health care professional in a hospital or clinic setting. Talk to your pediatrician regarding the use  of this medicine in children. Special care may be needed. Overdosage: If you think you have taken too much of this medicine contact a poison control center or emergency room at once. NOTE: This medicine is only for you. Do not share this medicine with others. What if I miss a dose? Keep appointments for follow-up doses as directed. It is important not to miss your dose. Call your doctor or health care professional if you are unable to keep an appointment. What may interact with this medicine? Interactions have not been studied. This list may not describe all possible interactions. Give your health care provider a list of all the medicines, herbs, non-prescription drugs, or dietary supplements you use. Also tell them if you smoke, drink alcohol, or use illegal drugs. Some items may interact with your medicine. What should I watch for while using this medicine? Your condition will be monitored carefully while you are receiving this medicine. This medicine can cause serious allergic reactions. To reduce your risk, your health care provider may give you other medicine to take before receiving this one. Be sure to follow the directions from your health care provider. This medicine can affect the results of blood tests to match your blood type. These changes can last for up to 6 months after the final dose. Your healthcare provider will do blood tests to match your blood type before you start treatment. Tell all of your healthcare providers that you are being treated with this medicine before receiving a blood transfusion. This medicine can affect the results of some tests used to determine treatment response; extra tests may be needed  to evaluate response. Do not become pregnant while taking this medicine or for 3 months after stopping it. Women should inform their health care provider if they wish to become pregnant or think they might be pregnant. There is a potential for serious side effects to an unborn  child. Talk to your health care provider for more information. Do not breast-feed an infant while taking this medicine. What side effects may I notice from receiving this medicine? Side effects that you should report to your doctor or health care professional as soon as possible:  allergic reactions (skin rash; itching or hives; swelling of the face, lips, or tongue)  infection (fever, chills, cough, sore throat, pain or difficulty passing urine)  infusion reaction (dizziness, fast heartbeat, feeling faint or lightheaded, falls, headache, increase in blood pressure, nausea, vomiting, or wheezing or trouble breathing with loud or whistling sounds)  unusual bleeding or bruising Side effects that usually do not require medical attention (report to your doctor or health care professional if they continue or are bothersome):  constipation  diarrhea  pain, tingling, numbness in the hands or feet  swelling of the ankles, feet, hands  tiredness This list may not describe all possible side effects. Call your doctor for medical advice about side effects. You may report side effects to FDA at 1-800-FDA-1088. Where should I keep my medicine? This drug is given in a hospital or clinic and will not be stored at home. NOTE: This sheet is a summary. It may not cover all possible information. If you have questions about this medicine, talk to your doctor, pharmacist, or health care provider.  2021 Elsevier/Gold Standard (2020-03-24 13:28:52) Bortezomib injection What is this medicine? BORTEZOMIB (bor TEZ oh mib) targets proteins in cancer cells and stops the cancer cells from growing. It treats multiple myeloma and mantle cell lymphoma. This medicine may be used for other purposes; ask your health care provider or pharmacist if you have questions. COMMON BRAND NAME(S): Velcade What should I tell my health care provider before I take this medicine? They need to know if you have any of these  conditions:  dehydration  diabetes (high blood sugar)  heart disease  liver disease  tingling of the fingers or toes or other nerve disorder  an unusual or allergic reaction to bortezomib, mannitol, boron, other medicines, foods, dyes, or preservatives  pregnant or trying to get pregnant  breast-feeding How should I use this medicine? This medicine is injected into a vein or under the skin. It is given by a health care provider in a hospital or clinic setting. Talk to your health care provider about the use of this medicine in children. Special care may be needed. Overdosage: If you think you have taken too much of this medicine contact a poison control center or emergency room at once. NOTE: This medicine is only for you. Do not share this medicine with others. What if I miss a dose? Keep appointments for follow-up doses. It is important not to miss your dose. Call your health care provider if you are unable to keep an appointment. What may interact with this medicine? This medicine may interact with the following medications:  ketoconazole  rifampin This list may not describe all possible interactions. Give your health care provider a list of all the medicines, herbs, non-prescription drugs, or dietary supplements you use. Also tell them if you smoke, drink alcohol, or use illegal drugs. Some items may interact with your medicine. What should I watch for while  using this medicine? Your condition will be monitored carefully while you are receiving this medicine. You may need blood work done while you are taking this medicine. You may get drowsy or dizzy. Do not drive, use machinery, or do anything that needs mental alertness until you know how this medicine affects you. Do not stand up or sit up quickly, especially if you are an older patient. This reduces the risk of dizzy or fainting spells This medicine may increase your risk of getting an infection. Call your health care  provider for advice if you get a fever, chills, sore throat, or other symptoms of a cold or flu. Do not treat yourself. Try to avoid being around people who are sick. Check with your health care provider if you have severe diarrhea, nausea, and vomiting, or if you sweat a lot. The loss of too much body fluid may make it dangerous for you to take this medicine. Do not become pregnant while taking this medicine or for 7 months after stopping it. Women should inform their health care provider if they wish to become pregnant or think they might be pregnant. Men should not father a child while taking this medicine and for 4 months after stopping it. There is a potential for serious harm to an unborn child. Talk to your health care provider for more information. Do not breast-feed an infant while taking this medicine or for 2 months after stopping it. This medicine may make it more difficult to get pregnant or father a child. Talk to your health care provider if you are concerned about your fertility. What side effects may I notice from receiving this medicine? Side effects that you should report to your doctor or health care professional as soon as possible:  allergic reactions (skin rash; itching or hives; swelling of the face, lips, or tongue)  bleeding (bloody or black, tarry stools; red or dark brown urine; spitting up blood or brown material that looks like coffee grounds; red spots on the skin; unusual bruising or bleeding from the eye, gums, or nose)  blurred vision or changes in vision  confusion  constipation  headache  heart failure (trouble breathing; fast, irregular heartbeat; sudden weight gain; swelling of the ankles, feet, hands)  infection (fever, chills, cough, sore throat, pain or trouble passing urine)  lack or loss of appetite  liver injury (dark yellow or brown urine; general ill feeling or flu-like symptoms; loss of appetite, right upper belly pain; yellowing of the eyes or  skin)  low blood pressure (dizziness; feeling faint or lightheaded, falls; unusually weak or tired)  muscle cramps  pain, redness, or irritation at site where injected  pain, tingling, numbness in the hands or feet  seizures  trouble breathing  unusual bruising or bleeding Side effects that usually do not require medical attention (report to your doctor or health care professional if they continue or are bothersome):  diarrhea  nausea  stomach pain  trouble sleeping  vomiting This list may not describe all possible side effects. Call your doctor for medical advice about side effects. You may report side effects to FDA at 1-800-FDA-1088. Where should I keep my medicine? This medicine is given in a hospital or clinic. It will not be stored at home. NOTE: This sheet is a summary. It may not cover all possible information. If you have questions about this medicine, talk to your doctor, pharmacist, or health care provider.  2021 Elsevier/Gold Standard (2020-03-24 13:22:53)  To help prevent nausea and vomiting after your treatment, we encourage you to take your nausea medication as directed.  BELOW ARE SYMPTOMS THAT SHOULD BE REPORTED IMMEDIATELY: . *FEVER GREATER THAN 100.4 F (38 C) OR HIGHER . *CHILLS OR SWEATING . *NAUSEA AND VOMITING THAT IS NOT CONTROLLED WITH YOUR NAUSEA MEDICATION . *UNUSUAL SHORTNESS OF BREATH . *UNUSUAL BRUISING OR BLEEDING . *URINARY PROBLEMS (pain or burning when urinating, or frequent urination) . *BOWEL PROBLEMS (unusual diarrhea, constipation, pain near the anus) . TENDERNESS IN MOUTH AND THROAT WITH OR WITHOUT PRESENCE OF ULCERS (sore throat, sores in mouth, or a toothache) . UNUSUAL RASH, SWELLING OR PAIN  . UNUSUAL VAGINAL DISCHARGE OR ITCHING   Items with * indicate a potential emergency and should be followed up as soon as possible or go to the Emergency Department if any problems should occur.  Please show the CHEMOTHERAPY ALERT  CARD or IMMUNOTHERAPY ALERT CARD at check-in to the Emergency Department and triage nurse.  Should you have questions after your visit or need to cancel or reschedule your appointment, please contact [facility]  [phone] and follow the prompts.  Office hours are 8:00 a.m. to 4:30 p.m. Monday - Friday. Please note that voicemails left after 4:00 p.m. may not be returned until the following business day.  We are closed weekends and major holidays. You have access to a nurse at all times for urgent questions. Please call the main number to the clinic [phone] and follow the prompts.  For any non-urgent questions, you may also contact your provider using MyChart. We now offer e-Visits for anyone 18 and older to request care online for non-urgent symptoms. For details visit [email or web address].   Also download the MyChart app! Go to the app store, search "MyChart", open the app, select [facility], and log in with your MyChart username and password.  Due to Covid, a mask is required upon entering the hospital/clinic. If you do not have a mask, one will be given to you upon arrival. For doctor visits, patients may have 1 support person aged 18 or older with them. For treatment visits, patients cannot have anyone with them due to current Covid guidelines and our immunocompromised population.  

## 2020-08-10 NOTE — Progress Notes (Signed)
Hematology/Oncology  Follow up note [facility] [place] [phone] [phone]   Patient Care Team: Leonel Ramsay, MD as PCP - General (Infectious Diseases)  REFERRING PROVIDER: Leonel Ramsay, MD  CHIEF COMPLAINTS/REASON FOR VISIT:  Follow-up for amyloidosis  HISTORY OF PRESENTING ILLNESS:   Nancy Rodriguez is a  75 y.o.  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  Work up include protein electrophoresis showed M protein 0.7,   Reviewed her previous medical records via care everywhere.  She was seen by Hematology Oncology at [facility] on 02/14/2017.  07/25/2007  SPEP showed M protein of 0.35, IFE showed IgG lamda 09/22/2007  Bone survey negative.  02/15/14 IgG 930, SPEP M protien 0.22, free kappa light chain 3.59, lamda 2.92, ratio 1.23  Hypercalcemia, resolved after stopping HCTZ.  # Transaminitis 03/28/20 US abdomen showed left liver lobe Complex 1.7 cm cyst  # 04/07/20 MRI abdomen w/wo contrast showed 2 benign liver cysts, 2 nonspecific hypovascular irighr liver lesions, abnormal bone marrow signal of L1 vertebral body. Patient was  advised to proceed with bone marrow biopsy and she declined.  # referred her to established care with GI and was seen on 04/19/20,  # Liver biopsy showed amyloidosis. Bowman MS/MS showed AL type # 05/20/20 recommend bone marrow biopsy which is scheduled on 05/24/2020.  Patient changed her mind and ask a biopsy to be canceled.  My team and I had multiple phone discussion with patient's daughter Nancy Rodriguez and later with the patient's son Nancy Rodriguez.  Patient agreed with bone marrow biopsy and a biopsy was scheduled on 06/09/2020.  05/20/20 NT proBNP  was normal,  TSH slightly increased, normal T4 Normal factor X Normal coags Troponin 19. Refer to  cardiology for evaluation. May need cardiac MRI  # 06/01/2020 2D echo result was reviewed and discussed with patient.  Patient has normal LVEF 60-65%. Mild asymmetric left ventricular hypertrophy.  Left ventricular diastolic parameters are indeterminate. average left ventricular global longitudinal strain is -16.8 %.  # 06/09/2020, bone marrow biopsy showed monoclonal plasmacytosis, 8%, amyloid deposit present.  Absent iron stores. 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 t (14;20) #06/17/2020, further discussed about diagnosis and treatment plan.  Patient declined bone marrow transplant evaluation.  Decision was made to proceed with Dara-CyborD chemotherapy treatments.  INTERVAL HISTORY [name] is a 75 y.o. female who has above history reviewed by me today presents for follow up visit for management of AL amyloidosis Problems and complaints are listed below: Patient presents for the evaluation prior to Dara-CyborD treatment. Nausea has improved after adding additional premedication.  No nausea vomiting diarrhea.  Denies any neuropathy. She was accompanied by one of her daughters.  Patient reports feeling well.  Drug induced nausea has resolved since adding premedication.. Patient now has an appointment with [facility] for second opinion. Denies any numbness tingling.  Lower extremity swelling has improved after applying compression stocking  Review of Systems  Constitutional: Negative for appetite change, chills, fatigue and fever.  HENT:   Negative for hearing loss, nosebleeds and voice change.   Eyes: Negative for eye problems.  Respiratory: Negative for chest tightness and cough.   Cardiovascular: Negative for chest pain and leg swelling.  Gastrointestinal: Negative for abdominal distention, abdominal pain, blood in  stool and nausea.  Endocrine: Negative for hot flashes.  Genitourinary: Negative for difficulty urinating and  frequency.   Musculoskeletal: Negative for arthralgias.  Skin: Negative for rash.  Neurological: Negative for extremity weakness and numbness.  Hematological: Negative for adenopathy.  Psychiatric/Behavioral: Negative for confusion.    MEDICAL HISTORY:  Past Medical History:  Diagnosis Date  . Anemia    Anemia in chronic kidney disease  . Chronic kidney disease    Stage 3b chronic kidney disease  . Diabetes mellitus without complication ([place])   . Hypercholesterolemia   . Hypertension   . MGUS (monoclonal gammopathy of unknown significance)   . Osteoarthritis   . Rheumatoid arthritis ([place])     SURGICAL HISTORY: Past Surgical History:  Procedure Laterality Date  . HERNIA REPAIR    . PARATHYROIDECTOMY      SOCIAL HISTORY: Social History   Socioeconomic History  . Marital status: Widowed    Spouse name: Not on file  . Number of children: 6  . Years of education: Not on file  . Highest education level: Not on file  Occupational History  . Not on file  Tobacco Use  . Smoking status: Never Smoker  . Smokeless tobacco: Never Used  Substance and Sexual Activity  . Alcohol use: No  . Drug use: Never  . Sexual activity: Not on file  Other Topics Concern  . Not on file  Social History Narrative  . Not on file   Social Determinants of Health   Financial Resource Strain: Not on file  Food Insecurity: Not on file  Transportation Needs: Not on file  Physical Activity: Not on file  Stress: Not on file  Social Connections: Not on file  Intimate Partner Violence: Not on file    FAMILY HISTORY: Family History  Problem Relation Age of Onset  . Diabetes Daughter   . Diabetes Son   . Dementia Mother   . Cancer Father   . Esophageal cancer Sister   . Brain cancer Brother     ALLERGIES:  is allergic to benazepril, lisinopril, tolmetin, and nsaids.  MEDICATIONS:  Current Outpatient Medications  Medication Sig Dispense Refill  . acyclovir (ZOVIRAX) 400 MG tablet  Take 1 tablet (400 mg total) by mouth 2 (two) times daily. 60 tablet 5  . albuterol (VENTOLIN HFA) 108 (90 Base) MCG/ACT inhaler Inhale 2 puffs into the lungs every 4 (four) hours as needed.    Marland Kitchen amLODipine (NORVASC) 5 MG tablet Take by mouth.    . cyclophosphamide (CYTOXAN) 50 MG capsule TAKE 10 CAPSULES (500 MG TOTAL) BY MOUTH ONCE A WEEK. TAKE WITH BREAKFAST. 40 capsule 3  . docusate sodium (COLACE) 100 MG capsule Take 1 capsule by mouth as needed.    Marland Kitchen EPINEPHrine 0.3 mg/0.3 mL IJ SOAJ injection as needed    . insulin glargine (LANTUS SOLOSTAR) 100 UNIT/ML Solostar Pen Inject 12 Units into the skin 2 (two) times daily. Pt takes in morning and bedtime.    Marland Kitchen LORazepam (ATIVAN) 0.5 MG tablet Take 1 tablet (0.5 mg total) by mouth every 6 (six) hours as needed (nausea vomiting). 30 tablet 0  . metoprolol succinate (TOPROL-XL) 50 MG 24 hr tablet TAKE 1 TABLET (50 MG TOTAL) BY MOUTH DAILY IN THE MORNING    . mirtazapine (REMERON) 7.5 MG tablet Take 1 tablet (7.5 mg total) by mouth at bedtime. 30 tablet 2  . ondansetron (ZOFRAN) 8 MG tablet Take 8 mg by mouth 30 to 60 min prior to Cytoxan administration  then take 8 mg twice daily as needed for nausea and vomiting. 30 tablet 1  . potassium chloride (KLOR-CON) 10 MEQ tablet Take 1 tablet (10 mEq total) by mouth daily. 30 tablet 0  . prochlorperazine (COMPAZINE) 10 MG tablet Take 1 tablet (10 mg total) by mouth every 6 (six) hours as needed for nausea or vomiting. 60 tablet 1  . traMADol (ULTRAM) 50 MG tablet Take 1 tablet (50 mg total) by mouth every 6 (six) hours as needed for severe pain. 8 tablet 0   No current facility-administered medications for this visit.     PHYSICAL EXAMINATION: ECOG PERFORMANCE STATUS: 1 - Symptomatic but completely ambulatory Vitals:   08/10/20 1314  BP: (!) 145/64  Pulse: 79  Resp: 18  Temp: 97.7 F (36.5 C)   Filed Weights   08/10/20 1314  Weight: 163 lb 3.2 oz (74 kg)    Physical Exam Constitutional:       General: She is not in acute distress. HENT:     Head: Normocephalic and atraumatic.  Eyes:     General: No scleral icterus. Cardiovascular:     Rate and Rhythm: Normal rate and regular rhythm.     Heart sounds: Normal heart sounds.  Pulmonary:     Effort: Pulmonary effort is normal. No respiratory distress.     Breath sounds: No wheezing.  Abdominal:     General: Bowel sounds are normal. There is no distension.     Palpations: Abdomen is soft.  Musculoskeletal:        General: Swelling present. No deformity. Normal range of motion.     Cervical back: Normal range of motion and neck supple.     Comments: Bilateral lower extremity 1+ edema  Skin:    General: Skin is warm and dry.     Findings: No erythema or rash.  Neurological:     Mental Status: She is alert and oriented to person, place, and time. Mental status is at baseline.     Cranial Nerves: No cranial nerve deficit.     Coordination: Coordination normal.  Psychiatric:        Mood and Affect: Mood normal.     LABORATORY DATA:  I have reviewed the data as listed Lab Results  Component Value Date   WBC 4.7 08/10/2020   HGB 8.6 (L) 08/10/2020   HCT 24.7 (L) 08/10/2020   MCV 73.5 (L) 08/10/2020   PLT 295 08/10/2020   Recent Labs    09/02/19 1608 03/16/20 1124 07/27/20 0914 08/03/20 0858 08/10/20 1245  NA 138   < > 136 137 133*  K 3.6   < > 3.7 3.4* 3.9  CL 100   < > 101 103 101  CO2 29   < > _0 GLUCOSE 216*   < > 174* 147* 198*  BUN 24*   < > _1 CREATININE 1.15*   < > 1.43* 1.06* 0.89  CALCIUM 9.4   < > 8.4* 8.4* 8.2*  GFRNONAA 47*   < > 38* 54* >60  GFRAA 54*  --   --   --   --   PROT 8.4*   < > 5.4* 4.9* 5.0*  ALBUMIN 3.9   < > 2.0* 1.9* 1.8*  AST 31   < > 62* 87* 82*  ALT 27   < > 40 54* 52*  ALKPHOS 170*   < > 997* 940* 1,108*  BILITOT 1.1   < > 2.3* 2.3*  3.0*   < > = values in this interval not displayed.   Iron/TIBC/Ferritin/ %Sat    Component Value Date/Time   IRON 45  09/02/2019 1608   TIBC 416 09/02/2019 1608   FERRITIN 35 09/02/2019 1608   IRONPCTSAT 11 09/02/2019 1608     08/25/2019, platelet count 491, WBC 7.5, hemoglobin 12 Creatinine 1.58, EGFR 37, calcium 10.8, albumin 4.2 Negative hepatitis B surface antigen, hepatitis B core antibody, Negative hepatitis C 08/05/2019, A1c 11.2   RADIOGRAPHIC STUDIES: I have personally reviewed the radiological images as listed and agreed with the findings in the report. US Venous Img Lower Bilateral  Result Date: 07/27/2020 CLINICAL DATA:  Bilateral lower extremity swelling EXAM: BILATERAL LOWER EXTREMITY VENOUS DOPPLER ULTRASOUND TECHNIQUE: Gray-scale sonography with graded compression, as well as color Doppler and duplex ultrasound were performed to evaluate the lower extremity deep venous systems from the level of the common femoral vein and including the common femoral, femoral, profunda femoral, popliteal and calf veins including the posterior tibial, peroneal and gastrocnemius veins when visible. The superficial great saphenous vein was also interrogated. Spectral Doppler was utilized to evaluate flow at rest and with distal augmentation maneuvers in the common femoral, femoral and popliteal veins. COMPARISON:  None. FINDINGS: RIGHT LOWER EXTREMITY Common Femoral Vein: No evidence of thrombus. Normal compressibility, respiratory phasicity and response to augmentation. Saphenofemoral Junction: No evidence of thrombus. Normal compressibility and flow on color Doppler imaging. Profunda Femoral Vein: No evidence of thrombus. Normal compressibility and flow on color Doppler imaging. Femoral Vein: No evidence of thrombus. Normal compressibility, respiratory phasicity and response to augmentation. Popliteal Vein: No evidence of thrombus. Normal compressibility, respiratory phasicity and response to augmentation. Calf Veins: No evidence of thrombus. Normal compressibility and flow on color Doppler imaging. Other Findings:   Lower extremity subcutaneous edema noted LEFT LOWER EXTREMITY Common Femoral Vein: No evidence of thrombus. Normal compressibility, respiratory phasicity and response to augmentation. Saphenofemoral Junction: No evidence of thrombus. Normal compressibility and flow on color Doppler imaging. Profunda Femoral Vein: No evidence of thrombus. Normal compressibility and flow on color Doppler imaging. Femoral Vein: No evidence of thrombus. Normal compressibility, respiratory phasicity and response to augmentation. Popliteal Vein: No evidence of thrombus. Normal compressibility, respiratory phasicity and response to augmentation. Calf Veins: No evidence of thrombus. Normal compressibility and flow on color Doppler imaging. Other Findings:  Lower extremity subcutaneous edema noted IMPRESSION: No evidence of deep venous thrombosis in either lower extremity. Electronically Signed   By: Jerilynn Mages.  Shick M.D.   On: 07/27/2020 14:28   CT BONE MARROW BIOPSY & ASPIRATION  Result Date: 06/09/2020 INDICATION: 75 year old female with a history of monoclonal gammopathy referred for bone marrow biopsy EXAM: CT BONE MARROW BIOPSY AND ASPIRATION MEDICATIONS: None. ANESTHESIA/SEDATION: Moderate (conscious) sedation was employed during this procedure. A total of Versed 1.0 mg and Fentanyl 50 mcg was administered intravenously. Moderate Sedation Time: 10 minutes. The patient's level of consciousness and vital signs were monitored continuously by radiology nursing throughout the procedure under my direct supervision. FLUOROSCOPY TIME:  CT COMPLICATIONS: None PROCEDURE: The procedure risks, benefits, and alternatives were explained to the patient. Questions regarding the procedure were encouraged and answered. The patient understands and consents to the procedure. Scout CT of the pelvis was performed for surgical planning purposes. The right posterior pelvis was prepped with Chlorhexidine in a sterile fashion, and a sterile drape was applied  covering the operative field. A sterile gown and sterile gloves were used for the procedure. Local anesthesia was provided  with 1% Lidocaine. Right posterior iliac bone was targeted for biopsy. The skin and subcutaneous tissues were infiltrated with 1% lidocaine without epinephrine. A small stab incision was made with an 11 blade scalpel, and an 11 gauge Murphy needle was advanced with CT guidance to the posterior cortex. Manual forced was used to advance the needle through the posterior cortex and the stylet was removed. A bone marrow aspirate was retrieved and passed to a cytotechnologist in the room. The Murphy needle was then advanced without the stylet for a core biopsy. The core biopsy was retrieved and also passed to a cytotechnologist. Manual pressure was used for hemostasis and a sterile dressing was placed. No complications were encountered no significant blood loss was encountered. Patient tolerated the procedure well and remained hemodynamically stable throughout. IMPRESSION: Status post CT-guided bone marrow biopsy, with tissue specimen sent to pathology for complete histopathologic analysis Signed, Dulcy Fanny. Earleen Newport, DO [facility] Electronically Signed   By: Corrie Mckusick D.O.   On: 06/09/2020 09:58   ECHOCARDIOGRAM LIMITED  Result Date: 06/01/2020    ECHOCARDIOGRAM LIMITED REPORT   Patient Name:   SEINI LANNOM Date of Exam: 06/01/2020 Medical Rec #:  [id number]         Height:       63.5 in Accession #:    [id number]        Weight:       146.5 lb Date of Birth:  02-14-1946          BSA:          1.704 m Patient Age:    64 years          BP:           143/83 mmHg Patient Gender: F                 HR:           84 bpm. Exam Location:  [facility] Procedure: 2D Echo, Cardiac Doppler, Color Doppler and Strain Analysis Indications:     Amyloidosis- unspecified type  History:         Patient has no prior history of Echocardiogram examinations.   Sonographer:     Sherrie Sport RDCS (AE) Referring Phys:  [id number] Cheresa Siers Diagnosing Phys: Kate Sable MD  Sonographer Comments: Global longitudinal strain was attempted. IMPRESSIONS  1. Left ventricular ejection fraction, by estimation, is 60 to 65%. The left ventricle has normal function. There is mild asymmetric left ventricular hypertrophy of the basal-septal segment. Left ventricular diastolic parameters are indeterminate. The average left ventricular global longitudinal strain is -16.8 %.  2. Right ventricular systolic function is normal.  3. The mitral valve is normal in structure. Mild mitral valve regurgitation.  4. The aortic valve is tricuspid. Aortic valve regurgitation is not visualized. Mild aortic valve sclerosis is present, with no evidence of aortic valve stenosis.  5. The inferior vena cava is normal in size with greater than 50% respiratory variability, suggesting right atrial pressure of 3 mmHg. Conclusion(s)/Recommendation(s): No echocardiographic findings to suggest cardiac amyloid on this study. FINDINGS  Left Ventricle: Left ventricular ejection fraction, by estimation, is 60 to 65%. The left ventricle has normal function. The average left ventricular global longitudinal strain is -16.8 %. The left ventricular internal cavity size was normal in size. There is mild asymmetric left ventricular hypertrophy of the basal-septal segment. Left ventricular diastolic parameters are indeterminate. Right Ventricle: No increase in right ventricular wall thickness.  Right ventricular systolic function is normal. Left Atrium: Left atrial size was normal in size. Right Atrium: Right atrial size was normal in size. Pericardium: There is no evidence of pericardial effusion. Mitral Valve: The mitral valve is normal in structure. Mild mitral valve regurgitation. Tricuspid Valve: The tricuspid valve is normal in structure. Tricuspid valve regurgitation is not demonstrated. Aortic Valve: The aortic valve is  tricuspid. Aortic valve regurgitation is not visualized. Mild aortic valve sclerosis is present, with no evidence of aortic valve stenosis. Aortic valve mean gradient measures 5.0 mmHg. Aortic valve peak gradient measures 8.2 mmHg. Aortic valve area, by VTI measures 1.58 cm. Pulmonic Valve: The pulmonic valve was normal in structure. Pulmonic valve regurgitation is trivial. Aorta: The aortic root is normal in size and structure. Venous: The inferior vena cava is normal in size with greater than 50% respiratory variability, suggesting right atrial pressure of 3 mmHg. LEFT VENTRICLE PLAX 2D LVIDd:         4.28 cm  Diastology LVIDs:         2.44 cm  LV e' medial:    5.66 cm/s LV PW:         1.72 cm  LV E/e' medial:  13.6 LV IVS:        1.01 cm  LV e' lateral:   5.11 cm/s LVOT diam:     2.00 cm  LV E/e' lateral: 15.1 LV SV:         49 LV SV Index:   29       2D Longitudinal Strain LVOT Area:     3.14 cm 2D Strain GLS Avg:     -16.8 %                          3D Volume EF:                         3D EF:        62 %                         LV EDV:       115 ml                         LV ESV:       44 ml                         LV SV:        71 ml RIGHT VENTRICLE RV Basal diam:  3.11 cm RV S prime:     10.10 cm/s TAPSE (M-mode): 3.1 cm LEFT ATRIUM             Index       RIGHT ATRIUM           Index LA diam:        3.80 cm 2.23 cm/m  RA Area:     13.60 cm LA Vol (A2C):   47.7 ml 27.99 ml/m RA Volume:   32.60 ml  19.13 ml/m LA Vol (A4C):   46.4 ml 27.23 ml/m LA Biplane Vol: 49.3 ml 28.93 ml/m  AORTIC VALVE                    PULMONIC VALVE AV Area (Vmax):    1.62 cm     PV  Vmax:        0.65 m/s AV Area (Vmean):   1.56 cm     PV Peak grad:   1.7 mmHg AV Area (VTI):     1.58 cm     RVOT Peak grad: 2 mmHg AV Vmax:           143.00 cm/s AV Vmean:          104.667 cm/s AV VTI:            0.309 m AV Peak Grad:      8.2 mmHg AV Mean Grad:      5.0 mmHg LVOT Vmax:         73.80 cm/s LVOT Vmean:        52.000 cm/s LVOT  VTI:          0.156 m LVOT/AV VTI ratio: 0.50  AORTA Ao Root diam: 3.10 cm MITRAL VALVE               TRICUSPID VALVE MV Area (PHT): 4.54 cm    TR Peak grad:   11.2 mmHg MV Decel Time: 167 msec    TR Vmax:        167.00 cm/s MV E velocity: 77.10 cm/s MV A velocity: 72.00 cm/s  SHUNTS MV E/A ratio:  1.07        Systemic VTI:  0.16 m                            Systemic Diam: 2.00 cm Kate Sable MD Electronically signed by Kate Sable MD Signature Date/Time: 06/01/2020/4:06:38 PM    Final       ASSESSMENT & PLAN:  1. Light chain (AL) amyloidosis (HCC)   2. Encounter for antineoplastic chemotherapy   3. 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[place])   4. Transaminitis   5. Chemotherapy induced nausea and vomiting   6. Hypokalemia   7. 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  Patient has biopsy confirmed liver involvement, possible kidney involvement- Labs reviewed and discussed with patient Proceed with cycle 2-day 15 Dara CyBorD. Hyperbilirubinemia.Bilirubin has worsened  Recommend patient to take 350 mg cyclophosphamide today..  Velcade  0.7 mg/m,  Pending [facility] opinion at [facility].  #Chemotherapy-induced nausea, likely secondary to the oral cyclophosphamide.  Continue Emend and Aloxi as premed.   Continue home antiemetic regimen-Zofran, Compazine, Ativan as instructed.  #Hypokalemia, potassium level 3.9.  Continue potassium chloride 10 mEq daily.  #Chronic kidney disease, follow-up with nephrology.  Kidney function is better  #Anxiety, continue Remeron. #History of major depression listed in outside problem list.  previous diagnosis and treatment details are not available to me in EMR  I have referred her to psychiatrist #Bilateral lower extremity swelling, negative lower extremity venous Doppler to rule out DVT.  Symptoms are better with compression stocking.  Continue  Supportive care measures  are necessary for patient well-being and will be provided as necessary. We spent sufficient time to discuss many aspect of care, questions were answered to patient's satisfaction.  All questions were answered. The patient knows to call the clinic with any problems questions or concerns.  Follow-up in 1 week. cc Leonel Ramsay, MD   [name], MD, PhD Hematology Oncology [facility] at [facility] Pager- [phone] 08/10/2020

## 2020-08-11 ENCOUNTER — Other Ambulatory Visit (HOSPITAL_COMMUNITY): Payer: Self-pay

## 2020-08-12 ENCOUNTER — Other Ambulatory Visit: Payer: Self-pay

## 2020-08-12 ENCOUNTER — Ambulatory Visit (INDEPENDENT_AMBULATORY_CARE_PROVIDER_SITE_OTHER): Payer: Medicare Other | Admitting: Internal Medicine

## 2020-08-12 ENCOUNTER — Encounter: Payer: Self-pay | Admitting: Internal Medicine

## 2020-08-12 VITALS — BP 110/50 | HR 70 | Ht 63.5 in | Wt 162.0 lb

## 2020-08-12 DIAGNOSIS — R609 Edema, unspecified: Secondary | ICD-10-CM | POA: Diagnosis not present

## 2020-08-12 DIAGNOSIS — C9000 Multiple myeloma not having achieved remission: Secondary | ICD-10-CM

## 2020-08-12 MED ORDER — FUROSEMIDE 20 MG PO TABS: ORAL_TABLET | ORAL | 0 refills | Status: DC

## 2020-08-12 NOTE — Progress Notes (Signed)
Follow-up Outpatient Visit Date: 08/12/2020  Primary Care Provider: Leonel Ramsay, MD [place] [address]  Chief Complaint: Swelling  HPI:  Ms. Stoneberg is a 75 y.o. 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  I met her in mid March, at which time Ms. Whipple was feeling well.  She had just started chemotherapy for her recently diagnosed amyloidosis.  We agreed to proceed with cardiac MRI to exclude infiltrative cardiomyopathy, though preceding echo showed normal systolic function and reasonable global longitudinal strain.  However, EKG was notable for low voltage with anteroseptal and inferior Q waves.  Ms. Norsworthy wished to have this test performed at [facility], which is scheduled for next month.  We agreed to defer medication changes.  Ms. Fritsch reports that she has been doing fairly well until two days ago.  She has been very careful with her diet, avoiding salty foods.  However, she had a "cheat day" two days ago, eating a gyro sandwich.  The next day, she felt generally unwell with significant swelling in both legs.  She also notes that her blood sugar was >300.  She took a laxative in an attempt to vomit but wasn't able to.  She had a bowel movement this morning and feels less bloated.  However, her leg swelling is still present.  Her legs both feel heavy and tight with only minimal improvement over the last day.  She denies chest pain, shortness of breath, palpitations, lightheadedness, and orthopnea.  She notes that her hemoglobin has been trending down and that she was recently told by Dr. Tasia Catchings that she will likely need a blood transfusion in the near future.  --------------------------------------------------------------------------------------------------  Cardiovascular History &  Procedures: Cardiovascular Problems:  Question cardiac amyloidosis and abnormal EKG  Risk Factors:  Hypertension, hyperlipidemia, diabetes mellitus, and age greater than 65  Cath/PCI:  None  CV Surgery:  None  EP Procedures and Devices:  None  Non-Invasive Evaluation(s):  TTE (06/01/2020): Normal LV size with mild asymmetric hypertrophy of the basal septum.  LVEF 60-65% with GLS -16.8%.  Diastolic parameters indeterminate.  Normal RV size and function.  Mild mitral regurgitation.  No obvious findings of cardiac amyloidosis.  Exercise stress echocardiogram (04/01/2012, [facility]): Normal study.   Recent CV Pertinent Labs: Lab Results  Component Value Date   INR 1.1 05/20/2020   K 3.9 08/10/2020   BUN 13 08/10/2020   CREATININE 0.89 08/10/2020    Past medical and surgical history were reviewed and updated in EPIC.  Current Meds  Medication Sig  . acyclovir (ZOVIRAX) 400 MG tablet Take 1 tablet (400 mg total) by mouth 2 (two) times daily.  Marland Kitchen albuterol (VENTOLIN HFA) 108 (90 Base) MCG/ACT inhaler Inhale 2 puffs into the lungs every 4 (four) hours as needed.  Marland Kitchen amLODipine (NORVASC) 5 MG tablet Take by mouth.  . cyclophosphamide (CYTOXAN) 50 MG capsule TAKE 10 CAPSULES (500 MG TOTAL) BY MOUTH ONCE A WEEK. TAKE WITH BREAKFAST.  Marland Kitchen docusate sodium (COLACE) 100 MG capsule Take 1 capsule by mouth as needed.  Marland Kitchen EPINEPHrine 0.3 mg/0.3 mL IJ SOAJ injection as needed  . insulin glargine (LANTUS SOLOSTAR) 100 UNIT/ML Solostar Pen Inject 12 Units into the skin 2 (two) times daily. Pt takes in morning and bedtime.  Marland Kitchen LORazepam (ATIVAN) 0.5 MG tablet Take 1 tablet (0.5 mg total) by mouth every  6 (six) hours as needed (nausea vomiting).  . metoprolol succinate (TOPROL-XL) 50 MG 24 hr tablet TAKE 1 TABLET (50 MG TOTAL) BY MOUTH DAILY IN THE MORNING  . mirtazapine (REMERON) 7.5 MG tablet Take 1 tablet (7.5 mg total) by mouth at bedtime.  . ondansetron (ZOFRAN) 8 MG tablet Take 8 mg by  mouth 30 to 60 min prior to Cytoxan administration then take 8 mg twice daily as needed for nausea and vomiting.  . potassium chloride (KLOR-CON) 10 MEQ tablet Take 1 tablet (10 mEq total) by mouth daily.  . prochlorperazine (COMPAZINE) 10 MG tablet Take 1 tablet (10 mg total) by mouth every 6 (six) hours as needed for nausea or vomiting.  . traMADol (ULTRAM) 50 MG tablet Take 1 tablet (50 mg total) by mouth every 6 (six) hours as needed for severe pain.    Allergies: Benazepril, Lisinopril, Tolmetin, and Nsaids  Social History   Tobacco Use  . Smoking status: Never Smoker  . Smokeless tobacco: Never Used  Vaping Use  . Vaping Use: Never used  Substance Use Topics  . Alcohol use: No  . Drug use: Never    Family History  Problem Relation Age of Onset  . Diabetes Daughter   . Diabetes Son   . Dementia Mother   . Cancer Father   . Esophageal cancer Sister   . Brain cancer Brother     Review of Systems: Ms. Wadley feels like her vision might be slightly blurry today.  She denies weakness and paresthesias.  Otherwise, a 12-system review of systems was performed and was negative except as noted in the HPI.  --------------------------------------------------------------------------------------------------  Physical Exam: BP (!) 110/50 (BP Location: Left Arm, Patient Position: Sitting, Cuff Size: Normal)   Pulse 70   Ht 5' 3.5" (1.613 m)   Wt 162 lb (73.5 kg)   SpO2 97%   BMI 28.25 kg/m    Repeat BP: R: 124/60 L: 130/60  General:  NAD. Neck: JVP 6-8 cm. Lungs: Clear to auscultation bilaterally without wheezes or crackles. Heart: Regular rate and rhythm without murmurs, rubs, or gallops. Abdomen: Soft, nontender, nondistended. Extremities: 2+ pitting edema in both calves.  Lab Results  Component Value Date   WBC 4.7 08/10/2020   HGB 8.6 (L) 08/10/2020   HCT 24.7 (L) 08/10/2020   MCV 73.5 (L) 08/10/2020   PLT 295 08/10/2020    Lab Results  Component Value Date    NA 133 (L) 08/10/2020   K 3.9 08/10/2020   CL 101 08/10/2020   CO2 24 08/10/2020   BUN 13 08/10/2020   CREATININE 0.89 08/10/2020   GLUCOSE 198 (H) 08/10/2020   ALT 52 (H) 08/10/2020    No results found for: CHOL, HDL, LDLCALC, LDLDIRECT, TRIG, CHOLHDL  --------------------------------------------------------------------------------------------------  ASSESSMENT AND PLAN: Edema Ms. Bigley exhibits significant edema on exam today, which is likely multifactorial, including recent dietary indescrition with significantly increased sodium intake compared to normal, as well as anemia and hypoalbuminemia.  Given that her legs are tight and uncomfortable, I think it would be helpful to add a short course of furosemide 20 mg daily for 2-3 days.  I encouraged her to continue minimizing her sodium intake.  I instructed her to increase her potasium chloride to 20 mEq daily while taking furosemide, after which she can go back to 10 mEq daily.  She is scheduled for routine labs with Dr. Tasia Catchings on Wednesday.  Multiple myeloma: Ms. Azimi is undergoing treatment per Dr. Tasia Catchings.  Cardiac MRI  to assess for infiltrative cardiomyopathy is scheduled at [facility] for 5/19.  Follow-up: Return to clinic in 2 weeks.  Nelva Bush, MD 08/12/2020 12:11 PM

## 2020-08-12 NOTE — Patient Instructions (Signed)
Medication Instructions:  Your physician has recommended you make the following change in your medication:   START Lasix (furosemide) 20 mg daily for 3 days or until swelling improves.  While taking Lasix increase Potassium to 20 mEq daily.   *If you need a refill on your cardiac medications before your next appointment, please call your pharmacy*   Lab Work: None ordered If you have labs (blood work) drawn today and your tests are completely normal, you will receive your results only by: Marland Kitchen MyChart Message (if you have MyChart) OR . A paper copy in the mail If you have any lab test that is abnormal or we need to change your treatment, we will call you to review the results.   Testing/Procedures: None ordered   Follow-Up: At [facility], you and your health needs are our priority.  As part of our continuing mission to provide you with exceptional heart care, we have created designated Provider Care Teams.  These Care Teams include your primary Cardiologist (physician) and Advanced Practice Providers (APPs -  Physician Assistants and Nurse Practitioners) who all work together to provide you with the care you need, when you need it.  We recommend signing up for the patient portal called "MyChart".  Sign up information is provided on this After Visit Summary.  MyChart is used to connect with patients for Virtual Visits (Telemedicine).  Patients are able to view lab/test results, encounter notes, upcoming appointments, etc.  Non-urgent messages can be sent to your provider as well.   To learn more about what you can do with MyChart, go to [email or web address].    Your next appointment:   2 week(s)  The format for your next appointment:   In Person  Provider:   Nelva Bush, MD   Other Instructions N/A

## 2020-08-13 ENCOUNTER — Encounter: Payer: Self-pay | Admitting: Internal Medicine

## 2020-08-13 DIAGNOSIS — C9000 Multiple myeloma not having achieved remission: Secondary | ICD-10-CM | POA: Insufficient documentation

## 2020-08-13 DIAGNOSIS — R609 Edema, unspecified: Secondary | ICD-10-CM | POA: Insufficient documentation

## 2020-08-16 ENCOUNTER — Other Ambulatory Visit (HOSPITAL_COMMUNITY): Payer: Medicare Other

## 2020-08-16 ENCOUNTER — Other Ambulatory Visit: Payer: Self-pay | Admitting: Infectious Diseases

## 2020-08-16 ENCOUNTER — Other Ambulatory Visit (HOSPITAL_COMMUNITY): Payer: Self-pay

## 2020-08-16 DIAGNOSIS — Z1231 Encounter for screening mammogram for malignant neoplasm of breast: Secondary | ICD-10-CM

## 2020-08-16 MED FILL — Cyclophosphamide Cap 50 MG: ORAL | 28 days supply | Qty: 40 | Fill #1 | Status: AC

## 2020-08-17 ENCOUNTER — Inpatient Hospital Stay: Payer: Medicare Other

## 2020-08-17 ENCOUNTER — Encounter: Payer: Self-pay | Admitting: Oncology

## 2020-08-17 ENCOUNTER — Ambulatory Visit: Payer: Medicare Other

## 2020-08-17 ENCOUNTER — Inpatient Hospital Stay: Payer: Medicare Other | Attending: Oncology

## 2020-08-17 ENCOUNTER — Inpatient Hospital Stay (HOSPITAL_BASED_OUTPATIENT_CLINIC_OR_DEPARTMENT_OTHER): Payer: Medicare Other | Admitting: Oncology

## 2020-08-17 VITALS — BP 125/75 | HR 84 | Temp 99.0°F | Resp 16 | Wt 157.9 lb

## 2020-08-17 VITALS — BP 151/65 | HR 85 | Resp 16

## 2020-08-17 DIAGNOSIS — Z5111 Encounter for antineoplastic chemotherapy: Secondary | ICD-10-CM | POA: Insufficient documentation

## 2020-08-17 DIAGNOSIS — D631 Anemia in chronic kidney disease: Secondary | ICD-10-CM | POA: Diagnosis not present

## 2020-08-17 DIAGNOSIS — Z79899 Other long term (current) drug therapy: Secondary | ICD-10-CM | POA: Diagnosis not present

## 2020-08-17 DIAGNOSIS — F419 Anxiety disorder, unspecified: Secondary | ICD-10-CM | POA: Insufficient documentation

## 2020-08-17 DIAGNOSIS — N1831 Chronic kidney disease, stage 3a: Secondary | ICD-10-CM | POA: Insufficient documentation

## 2020-08-17 DIAGNOSIS — E876 Hypokalemia: Secondary | ICD-10-CM | POA: Diagnosis not present

## 2020-08-17 DIAGNOSIS — R112 Nausea with vomiting, unspecified: Secondary | ICD-10-CM | POA: Diagnosis not present

## 2020-08-17 DIAGNOSIS — E8581 Light chain (AL) amyloidosis: Secondary | ICD-10-CM | POA: Diagnosis not present

## 2020-08-17 DIAGNOSIS — K5900 Constipation, unspecified: Secondary | ICD-10-CM | POA: Diagnosis not present

## 2020-08-17 DIAGNOSIS — E859 Amyloidosis, unspecified: Secondary | ICD-10-CM

## 2020-08-17 DIAGNOSIS — F32A Depression, unspecified: Secondary | ICD-10-CM

## 2020-08-17 DIAGNOSIS — D509 Iron deficiency anemia, unspecified: Secondary | ICD-10-CM | POA: Diagnosis not present

## 2020-08-17 DIAGNOSIS — T451X5D Adverse effect of antineoplastic and immunosuppressive drugs, subsequent encounter: Secondary | ICD-10-CM

## 2020-08-17 DIAGNOSIS — R7401 Elevation of levels of liver transaminase levels: Secondary | ICD-10-CM

## 2020-08-17 DIAGNOSIS — T451X5A Adverse effect of antineoplastic and immunosuppressive drugs, initial encounter: Secondary | ICD-10-CM

## 2020-08-17 LAB — COMPREHENSIVE METABOLIC PANEL
ALT: 50 U/L — ABNORMAL HIGH (ref 0–44)
AST: 82 U/L — ABNORMAL HIGH (ref 15–41)
Albumin: 1.9 g/dL — ABNORMAL LOW (ref 3.5–5.0)
Alkaline Phosphatase: 1199 U/L — ABNORMAL HIGH (ref 38–126)
Anion gap: 8 (ref 5–15)
BUN: 13 mg/dL (ref 8–23)
CO2: 25 mmol/L (ref 22–32)
Calcium: 8.4 mg/dL — ABNORMAL LOW (ref 8.9–10.3)
Chloride: 101 mmol/L (ref 98–111)
Creatinine, Ser: 0.96 mg/dL (ref 0.44–1.00)
GFR, Estimated: >60 mL/min (ref >60)
Glucose, Bld: 107 mg/dL — ABNORMAL HIGH (ref 70–99)
Potassium: 3.7 mmol/L (ref 3.5–5.1)
Sodium: 134 mmol/L — ABNORMAL LOW (ref 135–145)
Total Bilirubin: 3.1 mg/dL — ABNORMAL HIGH (ref 0.3–1.2)
Total Protein: 5 g/dL — ABNORMAL LOW (ref 6.5–8.1)

## 2020-08-17 LAB — CBC WITH DIFFERENTIAL/PLATELET
Abs Immature Granulocytes: 0.05 10*3/uL (ref 0.00–0.07)
Basophils Absolute: 0 10*3/uL (ref 0.0–0.1)
Basophils Relative: 1 %
Eosinophils Absolute: 0.1 10*3/uL (ref 0.0–0.5)
Eosinophils Relative: 2 %
HCT: 24.5 % — ABNORMAL LOW (ref 36.0–46.0)
Hemoglobin: 8.5 g/dL — ABNORMAL LOW (ref 12.0–15.0)
Immature Granulocytes: 1 %
Lymphocytes Relative: 26 %
Lymphs Abs: 1 10*3/uL (ref 0.7–4.0)
MCH: 25.4 pg — ABNORMAL LOW (ref 26.0–34.0)
MCHC: 34.7 g/dL (ref 30.0–36.0)
MCV: 73.1 fL — ABNORMAL LOW (ref 80.0–100.0)
Monocytes Absolute: 0.4 10*3/uL (ref 0.1–1.0)
Monocytes Relative: 11 %
Neutro Abs: 2.2 10*3/uL (ref 1.7–7.7)
Neutrophils Relative %: 59 %
Platelets: 369 10*3/uL (ref 150–400)
RBC: 3.35 MIL/uL — ABNORMAL LOW (ref 3.87–5.11)
RDW: 24.7 % — ABNORMAL HIGH (ref 11.5–15.5)
WBC: 3.7 10*3/uL — ABNORMAL LOW (ref 4.0–10.5)
nRBC: 2.2 % — ABNORMAL HIGH (ref 0.0–0.2)

## 2020-08-17 MED ORDER — DARATUMUMAB-HYALURONIDASE-FIHJ 1800-30000 MG-UT/15ML ~~LOC~~ SOLN
1800.0000 mg | Freq: Once | SUBCUTANEOUS | Status: AC
  Administered 2020-08-17: 1800 mg via SUBCUTANEOUS
  Filled 2020-08-17: qty 15

## 2020-08-17 MED ORDER — PALONOSETRON HCL INJECTION 0.25 MG/5ML
0.2500 mg | Freq: Once | INTRAVENOUS | Status: AC
  Administered 2020-08-17: 0.25 mg via INTRAVENOUS
  Filled 2020-08-17: qty 5

## 2020-08-17 MED ORDER — BORTEZOMIB CHEMO SQ INJECTION 3.5 MG (2.5MG/ML)
0.7000 mg/m2 | Freq: Once | INTRAMUSCULAR | Status: AC
  Administered 2020-08-17: 1.25 mg via SUBCUTANEOUS
  Filled 2020-08-17: qty 0.5

## 2020-08-17 MED ORDER — ONDANSETRON HCL 8 MG PO TABS: ORAL_TABLET | ORAL | 1 refills | Status: DC

## 2020-08-17 MED ORDER — ACETAMINOPHEN 325 MG PO TABS
650.0000 mg | ORAL_TABLET | Freq: Once | ORAL | Status: AC
  Administered 2020-08-17: 650 mg via ORAL
  Filled 2020-08-17: qty 2

## 2020-08-17 MED ORDER — SODIUM CHLORIDE 0.9 % IV SOLN
Freq: Once | INTRAVENOUS | Status: AC
  Filled 2020-08-17: qty 250

## 2020-08-17 MED ORDER — SODIUM CHLORIDE 0.9 % IV SOLN
150.0000 mg | Freq: Once | INTRAVENOUS | Status: AC
  Administered 2020-08-17: 150 mg via INTRAVENOUS
  Filled 2020-08-17: qty 150

## 2020-08-17 MED ORDER — DIPHENHYDRAMINE HCL 25 MG PO CAPS
50.0000 mg | ORAL_CAPSULE | Freq: Once | ORAL | Status: AC
Start: 2020-08-17 — End: 2020-08-17
  Administered 2020-08-17: 50 mg via ORAL
  Filled 2020-08-17: qty 2

## 2020-08-17 MED ORDER — DEXAMETHASONE 4 MG PO TABS
40.0000 mg | ORAL_TABLET | Freq: Once | ORAL | Status: AC
  Administered 2020-08-17: 40 mg via ORAL
  Filled 2020-08-17: qty 10

## 2020-08-17 NOTE — Progress Notes (Signed)
Hematology/Oncology  Follow up note [facility] [phone] [phone]   Patient Care Team: Nancy Ramsay, MD as PCP - General (Infectious Diseases)  REFERRING PROVIDER: Leonel Ramsay, MD  CHIEF COMPLAINTS/REASON FOR VISIT:  Follow-up for amyloidosis  HISTORY OF PRESENTING ILLNESS:   Nancy Rodriguez is a  75 y.o.  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  Work up include protein electrophoresis showed M protein 0.7,   Reviewed her previous medical records via care everywhere.  She was seen by Hematology Oncology at [facility] on 02/14/2017.  07/25/2007  SPEP showed M protein of 0.35, IFE showed IgG lamda 09/22/2007  Bone survey negative.  02/15/14 IgG 930, SPEP M protien 0.22, free kappa light chain 3.59, lamda 2.92, ratio 1.23  Hypercalcemia, resolved after stopping HCTZ.  # Transaminitis 03/28/20 US abdomen showed left liver lobe Complex 1.7 cm cyst  # 04/07/20 MRI abdomen w/wo contrast showed 2 benign liver cysts, 2 nonspecific hypovascular irighr liver lesions, abnormal bone marrow signal of L1 vertebral body. Patient was  advised to proceed with bone marrow biopsy and she declined.  # referred her to established care with GI and was seen on 04/19/20,  # Liver biopsy showed amyloidosis. [place] MS/MS showed AL type # 05/20/20 recommend bone marrow biopsy which is scheduled on 05/24/2020.  Patient changed her mind and ask a biopsy to be canceled.  My team and I had multiple phone discussion with patient's daughter Nancy Rodriguez and later with the patient's son Nancy Rodriguez.  Patient agreed with bone marrow biopsy and a biopsy was scheduled on 06/09/2020.  05/20/20 NT proBNP  was normal,  TSH slightly increased, normal T4 Normal factor X Normal coags Troponin 19. Refer to  cardiology for evaluation. May need cardiac MRI  # 06/01/2020 2D echo result was reviewed and discussed with patient.  Patient has normal LVEF 60-65%. Mild asymmetric left ventricular hypertrophy.  Left ventricular diastolic parameters are indeterminate. average left ventricular global longitudinal strain is -16.8 %.  # 06/09/2020, bone marrow biopsy showed monoclonal plasmacytosis, 8%, amyloid deposit present.  Absent iron stores. 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 t (14;20) #06/17/2020, further discussed about diagnosis and treatment plan.  Patient declined bone marrow transplant evaluation.  Decision was made to proceed with Dara-CyborD chemotherapy treatments.  INTERVAL HISTORY Nancy Rodriguez is a 75 y.o. female who has above history reviewed by me today presents for follow up visit for management of AL amyloidosis Problems and complaints are listed below: Patient presents for the evaluation prior to Dara-CyborD treatment. Aloxi and Emend premedication help her nausea however she reports having nausea after day 3 of chemotherapy.  She takes ondansetron with some improvement, but symptoms are not completely gone.   No vomiting diarrhea.  Denies any neuropathy Patient was accompanied by one of her daughters. She has appointment with [facility] oncology Dr. Tracey Rodriguez for second opinion on 08/23/2020. Cardiology prescribed Lasix 20 mg for 3 days or until swelling improves.  Patient has taken Lasix for 5 days and lower extremity swelling has improved.  Not currently on Lasix.  Reports that appetite has decreased.  She has lost 5 pounds.  Review of Systems  Constitutional: Negative for appetite change, chills, fatigue and fever.  HENT:   Negative for hearing  loss, nosebleeds and voice change.   Eyes: Negative for eye problems.  Respiratory: Negative for chest tightness and cough.   Cardiovascular: Negative for chest pain and leg swelling.   Gastrointestinal: Positive for nausea. Negative for abdominal distention, abdominal pain and blood in stool.  Endocrine: Negative for hot flashes.  Genitourinary: Negative for difficulty urinating and frequency.   Musculoskeletal: Negative for arthralgias.  Skin: Positive for itching. Negative for rash.  Neurological: Negative for extremity weakness and numbness.  Hematological: Negative for adenopathy.  Psychiatric/Behavioral: Negative for confusion.    MEDICAL HISTORY:  Past Medical History:  Diagnosis Date  . Anemia    Anemia in chronic kidney disease  . Chronic kidney disease    Stage 3b chronic kidney disease  . Diabetes mellitus without complication ([place])   . Hypercholesterolemia   . Hypertension   . MGUS (monoclonal gammopathy of unknown significance)   . Osteoarthritis   . Rheumatoid arthritis ([place])     SURGICAL HISTORY: Past Surgical History:  Procedure Laterality Date  . HERNIA REPAIR    . PARATHYROIDECTOMY      SOCIAL HISTORY: Social History   Socioeconomic History  . Marital status: Widowed    Spouse name: Not on file  . Number of children: 6  . Years of education: Not on file  . Highest education level: Not on file  Occupational History  . Not on file  Tobacco Use  . Smoking status: Never Smoker  . Smokeless tobacco: Never Used  Vaping Use  . Vaping Use: Never used  Substance and Sexual Activity  . Alcohol use: No  . Drug use: Never  . Sexual activity: Not on file  Other Topics Concern  . Not on file  Social History Narrative  . Not on file   Social Determinants of Health   Financial Resource Strain: Not on file  Food Insecurity: Not on file  Transportation Needs: Not on file  Physical Activity: Not on file  Stress: Not on file  Social Connections: Not on file  Intimate Partner Violence: Not on file    FAMILY HISTORY: Family History  Problem Relation Age of Onset  . Diabetes Daughter   . Diabetes Son   . Dementia Mother   .  Cancer Father   . Esophageal cancer Sister   . Brain cancer Brother     ALLERGIES:  is allergic to benazepril, lisinopril, tolmetin, and nsaids.  MEDICATIONS:  Current Outpatient Medications  Medication Sig Dispense Refill  . acyclovir (ZOVIRAX) 400 MG tablet Take 1 tablet (400 mg total) by mouth 2 (two) times daily. 60 tablet 5  . albuterol (VENTOLIN HFA) 108 (90 Base) MCG/ACT inhaler Inhale 2 puffs into the lungs every 4 (four) hours as needed.    Marland Kitchen amLODipine (NORVASC) 5 MG tablet Take by mouth.    . cyclophosphamide (CYTOXAN) 50 MG capsule TAKE 10 CAPSULES (500 MG TOTAL) BY MOUTH ONCE A WEEK. TAKE WITH BREAKFAST. 40 capsule 3  . docusate sodium (COLACE) 100 MG capsule Take 1 capsule by mouth as needed.    Marland Kitchen EPINEPHrine 0.3 mg/0.3 mL IJ SOAJ injection as needed    . insulin glargine (LANTUS SOLOSTAR) 100 UNIT/ML Solostar Pen Inject 12 Units into the skin 2 (two) times daily. Pt takes in morning and bedtime.    Marland Kitchen LORazepam (ATIVAN) 0.5 MG tablet Take 1 tablet (0.5 mg total) by mouth every 6 (six) hours as needed (nausea vomiting). 30 tablet 0  . metoprolol succinate (TOPROL-XL) 50 MG 24 hr tablet  TAKE 1 TABLET (50 MG TOTAL) BY MOUTH DAILY IN THE MORNING    . mirtazapine (REMERON) 7.5 MG tablet Take 1 tablet (7.5 mg total) by mouth at bedtime. 30 tablet 2  . potassium chloride (KLOR-CON) 10 MEQ tablet Take 1 tablet (10 mEq total) by mouth daily. 30 tablet 0  . traMADol (ULTRAM) 50 MG tablet Take 1 tablet (50 mg total) by mouth every 6 (six) hours as needed for severe pain. 8 tablet 0  . furosemide (LASIX) 20 MG tablet Take 1 tablet ($RemoveB'20mg'cPunTAtm$ ) daily for 3 days or until swelling improves (Patient not taking: Reported on 08/17/2020) 30 tablet 0  . ondansetron (ZOFRAN) 8 MG tablet Take 8 mg by mouth 30 to 60 min prior to Cytoxan administration then take 8 mg twice daily as needed for nausea and vomiting. 30 tablet 1  . prochlorperazine (COMPAZINE) 10 MG tablet Take 1 tablet (10 mg total) by mouth  every 6 (six) hours as needed for nausea or vomiting. (Patient not taking: Reported on 08/17/2020) 60 tablet 1   No current facility-administered medications for this visit.     PHYSICAL EXAMINATION: ECOG PERFORMANCE STATUS: 1 - Symptomatic but completely ambulatory Vitals:   08/17/20 1317  BP: 125/75  Pulse: 84  Resp: 16  Temp: 99 F (37.2 C)   Filed Weights   08/17/20 1317  Weight: 157 lb 14.4 oz (71.6 kg)    Physical Exam Constitutional:      General: She is not in acute distress. HENT:     Head: Normocephalic and atraumatic.  Eyes:     General: No scleral icterus. Cardiovascular:     Rate and Rhythm: Normal rate and regular rhythm.     Heart sounds: Normal heart sounds.  Pulmonary:     Effort: Pulmonary effort is normal. No respiratory distress.     Breath sounds: No wheezing.  Abdominal:     General: Bowel sounds are normal. There is no distension.     Palpations: Abdomen is soft.  Musculoskeletal:        General: Swelling present. No deformity. Normal range of motion.     Cervical back: Normal range of motion and neck supple.     Comments: Bilateral lower extremity 1+ edema  Skin:    General: Skin is warm and dry.     Findings: No erythema or rash.  Neurological:     Mental Status: She is alert and oriented to person, place, and time. Mental status is at baseline.     Cranial Nerves: No cranial nerve deficit.     Coordination: Coordination normal.  Psychiatric:        Mood and Affect: Mood normal.     LABORATORY DATA:  I have reviewed the data as listed Lab Results  Component Value Date   WBC 3.7 (L) 08/17/2020   HGB 8.5 (L) 08/17/2020   HCT 24.5 (L) 08/17/2020   MCV 73.1 (L) 08/17/2020   PLT 369 08/17/2020   Recent Labs    09/02/19 1608 03/16/20 1124 08/03/20 0858 08/10/20 1245 08/17/20 1249  NA 138   < > 137 133* 134*  K 3.6   < > 3.4* 3.9 3.7  CL 100   < > 103 101 101  CO2 29   < > $R'25 24 25  'Jd$ GLUCOSE 216*   < > 147* 198* 107*  BUN 24*    < > $R'21 13 13  'lH$ CREATININE 1.15*   < > 1.06* 0.89 0.96  CALCIUM 9.4   < >  8.4* 8.2* 8.4*  GFRNONAA 47*   < > 54* >60 >60  GFRAA 54*  --   --   --   --   PROT 8.4*   < > 4.9* 5.0* 5.0*  ALBUMIN 3.9   < > 1.9* 1.8* 1.9*  AST 31   < > 87* 82* 82*  ALT 27   < > 54* 52* 50*  ALKPHOS 170*   < > 940* 1,108* 1,199*  BILITOT 1.1   < > 2.3* 3.0* 3.1*   < > = values in this interval not displayed.   Iron/TIBC/Ferritin/ %Sat    Component Value Date/Time   IRON 45 09/02/2019 1608   TIBC 416 09/02/2019 1608   FERRITIN 35 09/02/2019 1608   IRONPCTSAT 11 09/02/2019 1608     08/25/2019, platelet count 491, WBC 7.5, hemoglobin 12 Creatinine 1.58, EGFR 37, calcium 10.8, albumin 4.2 Negative hepatitis B surface antigen, hepatitis B core antibody, Negative hepatitis C 08/05/2019, A1c 11.2   RADIOGRAPHIC STUDIES: I have personally reviewed the radiological images as listed and agreed with the findings in the report. US Venous Img Lower Bilateral  Result Date: 07/27/2020 CLINICAL DATA:  Bilateral lower extremity swelling EXAM: BILATERAL LOWER EXTREMITY VENOUS DOPPLER ULTRASOUND TECHNIQUE: Gray-scale sonography with graded compression, as well as color Doppler and duplex ultrasound were performed to evaluate the lower extremity deep venous systems from the level of the common femoral vein and including the common femoral, femoral, profunda femoral, popliteal and calf veins including the posterior tibial, peroneal and gastrocnemius veins when visible. The superficial great saphenous vein was also interrogated. Spectral Doppler was utilized to evaluate flow at rest and with distal augmentation maneuvers in the common femoral, femoral and popliteal veins. COMPARISON:  None. FINDINGS: RIGHT LOWER EXTREMITY Common Femoral Vein: No evidence of thrombus. Normal compressibility, respiratory phasicity and response to augmentation. Saphenofemoral Junction: No evidence of thrombus. Normal compressibility and flow on color  Doppler imaging. Profunda Femoral Vein: No evidence of thrombus. Normal compressibility and flow on color Doppler imaging. Femoral Vein: No evidence of thrombus. Normal compressibility, respiratory phasicity and response to augmentation. Popliteal Vein: No evidence of thrombus. Normal compressibility, respiratory phasicity and response to augmentation. Calf Veins: No evidence of thrombus. Normal compressibility and flow on color Doppler imaging. Other Findings:  Lower extremity subcutaneous edema noted LEFT LOWER EXTREMITY Common Femoral Vein: No evidence of thrombus. Normal compressibility, respiratory phasicity and response to augmentation. Saphenofemoral Junction: No evidence of thrombus. Normal compressibility and flow on color Doppler imaging. Profunda Femoral Vein: No evidence of thrombus. Normal compressibility and flow on color Doppler imaging. Femoral Vein: No evidence of thrombus. Normal compressibility, respiratory phasicity and response to augmentation. Popliteal Vein: No evidence of thrombus. Normal compressibility, respiratory phasicity and response to augmentation. Calf Veins: No evidence of thrombus. Normal compressibility and flow on color Doppler imaging. Other Findings:  Lower extremity subcutaneous edema noted IMPRESSION: No evidence of deep venous thrombosis in either lower extremity. Electronically Signed   By: Jerilynn Mages.  Shick M.D.   On: 07/27/2020 14:28   CT BONE MARROW BIOPSY & ASPIRATION  Result Date: 06/09/2020 INDICATION: 75 year old female with a history of monoclonal gammopathy referred for bone marrow biopsy EXAM: CT BONE MARROW BIOPSY AND ASPIRATION MEDICATIONS: None. ANESTHESIA/SEDATION: Moderate (conscious) sedation was employed during this procedure. A total of Versed 1.0 mg and Fentanyl 50 mcg was administered intravenously. Moderate Sedation Time: 10 minutes. The patient's level of consciousness and vital signs were monitored continuously by radiology nursing throughout the  procedure  under my direct supervision. FLUOROSCOPY TIME:  CT COMPLICATIONS: None PROCEDURE: The procedure risks, benefits, and alternatives were explained to the patient. Questions regarding the procedure were encouraged and answered. The patient understands and consents to the procedure. Scout CT of the pelvis was performed for surgical planning purposes. The right posterior pelvis was prepped with Chlorhexidine in a sterile fashion, and a sterile drape was applied covering the operative field. A sterile gown and sterile gloves were used for the procedure. Local anesthesia was provided with 1% Lidocaine. Right posterior iliac bone was targeted for biopsy. The skin and subcutaneous tissues were infiltrated with 1% lidocaine without epinephrine. A small stab incision was made with an 11 blade scalpel, and an 11 gauge Murphy needle was advanced with CT guidance to the posterior cortex. Manual forced was used to advance the needle through the posterior cortex and the stylet was removed. A bone marrow aspirate was retrieved and passed to a cytotechnologist in the room. The Murphy needle was then advanced without the stylet for a core biopsy. The core biopsy was retrieved and also passed to a cytotechnologist. Manual pressure was used for hemostasis and a sterile dressing was placed. No complications were encountered no significant blood loss was encountered. Patient tolerated the procedure well and remained hemodynamically stable throughout. IMPRESSION: Status post CT-guided bone marrow biopsy, with tissue specimen sent to pathology for complete histopathologic analysis Signed, Dulcy Fanny. Earleen Newport, DO [facility] Radiology Electronically Signed   By: Corrie Mckusick D.O.   On: 06/09/2020 09:58   ECHOCARDIOGRAM LIMITED  Result Date: 06/01/2020    ECHOCARDIOGRAM LIMITED REPORT   Patient Name:   SAMIHA DENAPOLI Date of Exam: 06/01/2020 Medical Rec #:  [id number]          Height:       63.5 in Accession #:    [id number]        Weight:       146.5 lb Date of Birth:  27-Apr-1945          BSA:          1.704 m Patient Age:    30 years          BP:           143/83 mmHg Patient Gender: F                 HR:           84 bpm. Exam Location:  [facility] Procedure: 2D Echo, Cardiac Doppler, Color Doppler and Strain Analysis Indications:     Amyloidosis- unspecified type  History:         Patient has no prior history of Echocardiogram examinations.  Sonographer:     Sherrie Sport RDCS (AE) Referring Phys:  [id number] Davy Faught Diagnosing Phys: Kate Sable MD  Sonographer Comments: Global longitudinal strain was attempted. IMPRESSIONS  1. Left ventricular ejection fraction, by estimation, is 60 to 65%. The left ventricle has normal function. There is mild asymmetric left ventricular hypertrophy of the basal-septal segment. Left ventricular diastolic parameters are indeterminate. The average left ventricular global longitudinal strain is -16.8 %.  2. Right ventricular systolic function is normal.  3. The mitral valve is normal in structure. Mild mitral valve regurgitation.  4. The aortic valve is tricuspid. Aortic valve regurgitation is not visualized. Mild aortic valve sclerosis is present, with no evidence of aortic valve stenosis.  5. The inferior vena cava is normal in size with greater than 50%  respiratory variability, suggesting right atrial pressure of 3 mmHg. Conclusion(s)/Recommendation(s): No echocardiographic findings to suggest cardiac amyloid on this study. FINDINGS  Left Ventricle: Left ventricular ejection fraction, by estimation, is 60 to 65%. The left ventricle has normal function. The average left ventricular global longitudinal strain is -16.8 %. The left ventricular internal cavity size was normal in size. There is mild asymmetric left ventricular hypertrophy of the basal-septal segment. Left ventricular diastolic parameters are indeterminate. Right Ventricle: No increase in right  ventricular wall thickness. Right ventricular systolic function is normal. Left Atrium: Left atrial size was normal in size. Right Atrium: Right atrial size was normal in size. Pericardium: There is no evidence of pericardial effusion. Mitral Valve: The mitral valve is normal in structure. Mild mitral valve regurgitation. Tricuspid Valve: The tricuspid valve is normal in structure. Tricuspid valve regurgitation is not demonstrated. Aortic Valve: The aortic valve is tricuspid. Aortic valve regurgitation is not visualized. Mild aortic valve sclerosis is present, with no evidence of aortic valve stenosis. Aortic valve mean gradient measures 5.0 mmHg. Aortic valve peak gradient measures 8.2 mmHg. Aortic valve area, by VTI measures 1.58 cm. Pulmonic Valve: The pulmonic valve was normal in structure. Pulmonic valve regurgitation is trivial. Aorta: The aortic root is normal in size and structure. Venous: The inferior vena cava is normal in size with greater than 50% respiratory variability, suggesting right atrial pressure of 3 mmHg. LEFT VENTRICLE PLAX 2D LVIDd:         4.28 cm  Diastology LVIDs:         2.44 cm  LV e' medial:    5.66 cm/s LV PW:         1.72 cm  LV E/e' medial:  13.6 LV IVS:        1.01 cm  LV e' lateral:   5.11 cm/s LVOT diam:     2.00 cm  LV E/e' lateral: 15.1 LV SV:         49 LV SV Index:   29       2D Longitudinal Strain LVOT Area:     3.14 cm 2D Strain GLS Avg:     -16.8 %                          3D Volume EF:                         3D EF:        62 %                         LV EDV:       115 ml                         LV ESV:       44 ml                         LV SV:        71 ml RIGHT VENTRICLE RV Basal diam:  3.11 cm RV S prime:     10.10 cm/s TAPSE (M-mode): 3.1 cm LEFT ATRIUM             Index       RIGHT ATRIUM           Index LA diam:  3.80 cm 2.23 cm/m  RA Area:     13.60 cm LA Vol (A2C):   47.7 ml 27.99 ml/m RA Volume:   32.60 ml  19.13 ml/m LA Vol (A4C):   46.4 ml 27.23  ml/m LA Biplane Vol: 49.3 ml 28.93 ml/m  AORTIC VALVE                    PULMONIC VALVE AV Area (Vmax):    1.62 cm     PV Vmax:        0.65 m/s AV Area (Vmean):   1.56 cm     PV Peak grad:   1.7 mmHg AV Area (VTI):     1.58 cm     RVOT Peak grad: 2 mmHg AV Vmax:           143.00 cm/s AV Vmean:          104.667 cm/s AV VTI:            0.309 m AV Peak Grad:      8.2 mmHg AV Mean Grad:      5.0 mmHg LVOT Vmax:         73.80 cm/s LVOT Vmean:        52.000 cm/s LVOT VTI:          0.156 m LVOT/AV VTI ratio: 0.50  AORTA Ao Root diam: 3.10 cm MITRAL VALVE               TRICUSPID VALVE MV Area (PHT): 4.54 cm    TR Peak grad:   11.2 mmHg MV Decel Time: 167 msec    TR Vmax:        167.00 cm/s MV E velocity: 77.10 cm/s MV A velocity: 72.00 cm/s  SHUNTS MV E/A ratio:  1.07        Systemic VTI:  0.16 m                            Systemic Diam: 2.00 cm Kate Sable MD Electronically signed by Kate Sable MD Signature Date/Time: 06/01/2020/4:06:38 PM    Final       ASSESSMENT & PLAN:  1. 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 Encounter for antineoplastic chemotherapy   4. 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[place])   5. Chemotherapy induced nausea and vomiting   6. Transaminitis   7. 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  Patient has biopsy confirmed liver involvement, possible kidney involvement- Labs reviewed and discussed with patient Proceed with cycle 2-day 21 Dara CyBorD. Hyperbilirubinemia. Bilirubin 3.1 recommend patient to take 350 mg cyclophosphamide today.. Velcade  0.7 mg/m,  And benefit for adding doxycycline? Pending second opinion at [facility] with Dr.Kang Check SPEP and light chain levels.   #Chemotherapy-induced nausea, likely secondary to the oral cyclophosphamide.  Continue Emend and Aloxi as premed.   Continue home antiemetic regimen-Zofran, Ativan as  instructed.  Also send her prescription of Phenergan 25 mg every 8 hours as needed.  #Hypokalemia, potassium level 3.7.  Continue potassium chloride 10 mEq daily.  #Chronic kidney disease, follow-up with nephrology.  Stable creatinine.  #Anxiety, continue Remeron. #History of major depression listed in outside problem list.  previous diagnosis and treatment details are not available to me in EMR  I have referred her to psychiatrist  #Bilateral lower extremity swelling,  negative lower extremity venous Doppler to rule out DVT.  Symptoms are better with compression stocking.  Continue.  Cardiology prescribed Lasix 20 mg daily  Patient's son Nancy Rodriguez was called and updated above recommendation. Supportive care measures are necessary for patient well-being and will be provided as necessary. We spent sufficient time to discuss many aspect of care, questions were answered to patient's satisfaction.  Follow-up in 1 week. cc Nancy Ramsay, MD   [name], MD, PhD Hematology Oncology [facility] Pager- [phone] 08/17/2020

## 2020-08-17 NOTE — Progress Notes (Signed)
OK to treat with AST 82 per Dr. Tasia Catchings.

## 2020-08-17 NOTE — Progress Notes (Signed)
Nutrition Follow-up:  Acute add on today to provide diet education on low sodium diet.   Patient with monoclonal gammopathy with AL amyloidosis.  Patient receiving chemotherapy.   Met with patient during infusion.  Patient reports that she has not been eating well over the weekend as she was worried about foods to eat with her leg swelling.    Notes reviewed from cardiology.     Medications: lasix  Labs: reviewed  Anthropometrics:   Weight 157 lb today (likely from fluid loss) 163 lb 3 oz 4/27   NUTRITION DIAGNOSIS: Food and nutrition knowledge deficit continues   INTERVENTION:  Discussed low sodium diet with patient and provided Low-sodium Nutrition Therapy handout from [facility].   Provided recipe for making sodium free seasoning and discussed other sodium free flavoring tips.   RD asked patient if she wanted RD to discuss low sodium eating tips with daughter in waiting area and she said "no".    MONITORING, EVALUATION, GOAL: weight trends, intake   NEXT VISIT: to be determined with treatments  Shalom Ware B. Zenia Resides, [place], [place] Registered Dietitian [phone] (mobile)

## 2020-08-17 NOTE — Patient Instructions (Signed)
[facility]  Discharge Instructions: Thank you for choosing [facility] to provide your oncology and hematology care.  If you have a lab appointment with the [facility], please go directly to the [facility] and check in at the registration area.  Wear comfortable clothing and clothing appropriate for easy access to any Portacath or PICC line.   We strive to give you quality time with your provider. You may need to reschedule your appointment if you arrive late (15 or more minutes).  Arriving late affects you and other patients whose appointments are after yours.  Also, if you miss three or more appointments without notifying the office, you may be dismissed from the clinic at the provider's discretion.      For prescription refill requests, have your pharmacy contact our office and allow 72 hours for refills to be completed.    Today you received the following chemotherapy and/or immunotherapy agents - Darzalex, Vidaza      To help prevent nausea and vomiting after your treatment, we encourage you to take your nausea medication as directed.  BELOW ARE SYMPTOMS THAT SHOULD BE REPORTED IMMEDIATELY: . *FEVER GREATER THAN 100.4 F (38 C) OR HIGHER . *CHILLS OR SWEATING . *NAUSEA AND VOMITING THAT IS NOT CONTROLLED WITH YOUR NAUSEA MEDICATION . *UNUSUAL SHORTNESS OF BREATH . *UNUSUAL BRUISING OR BLEEDING . *URINARY PROBLEMS (pain or burning when urinating, or frequent urination) . *BOWEL PROBLEMS (unusual diarrhea, constipation, pain near the anus) . TENDERNESS IN MOUTH AND THROAT WITH OR WITHOUT PRESENCE OF ULCERS (sore throat, sores in mouth, or a toothache) . UNUSUAL RASH, SWELLING OR PAIN  . UNUSUAL VAGINAL DISCHARGE OR ITCHING   Items with * indicate a potential emergency and should be followed up as soon as possible or go to the Emergency Department if any problems should occur.  Please show the CHEMOTHERAPY ALERT CARD or  IMMUNOTHERAPY ALERT CARD at check-in to the Emergency Department and triage nurse.  Should you have questions after your visit or need to cancel or reschedule your appointment, please contact [facility]  [phone] and follow the prompts.  Office hours are 8:00 a.m. to 4:30 p.m. Monday - Friday. Please note that voicemails left after 4:00 p.m. may not be returned until the following business day.  We are closed weekends and major holidays. You have access to a nurse at all times for urgent questions. Please call the main number to the clinic [phone] and follow the prompts.  For any non-urgent questions, you may also contact your provider using MyChart. We now offer e-Visits for anyone 27 and older to request care online for non-urgent symptoms. For details visit [email or web address].   Also download the MyChart app! Go to the app store, search "MyChart", open the app, select [facility], and log in with your MyChart username and password.  Due to Covid, a mask is required upon entering the hospital/clinic. If you do not have a mask, one will be given to you upon arrival. For doctor visits, patients may have 1 support person aged 66 or older with them. For treatment visits, patients cannot have anyone with them due to current Covid guidelines and our immunocompromised population.   Daratumumab injection What is this medicine? DARATUMUMAB (dar a toom ue mab) is a monoclonal antibody. It is used to treat multiple myeloma. This medicine may be used for other purposes; ask your health care provider or pharmacist if you have questions. COMMON  BRAND NAME(S): DARZALEX What should I tell my health care provider before I take this medicine? They need to know if you have any of these conditions:  hereditary fructose intolerance  infection (especially a virus infection such as chickenpox, herpes, or hepatitis B virus)  lung or breathing disease (asthma,  COPD)  an unusual or allergic reaction to daratumumab, sorbitol, other medicines, foods, dyes, or preservatives  pregnant or trying to get pregnant  breast-feeding How should I use this medicine? This medicine is for infusion into a vein. It is given by a health care professional in a hospital or clinic setting. Talk to your pediatrician regarding the use of this medicine in children. Special care may be needed. Overdosage: If you think you have taken too much of this medicine contact a poison control center or emergency room at once. NOTE: This medicine is only for you. Do not share this medicine with others. What if I miss a dose? Keep appointments for follow-up doses as directed. It is important not to miss your dose. Call your doctor or health care professional if you are unable to keep an appointment. What may interact with this medicine? Interactions have not been studied. This list may not describe all possible interactions. Give your health care provider a list of all the medicines, herbs, non-prescription drugs, or dietary supplements you use. Also tell them if you smoke, drink alcohol, or use illegal drugs. Some items may interact with your medicine. What should I watch for while using this medicine? Your condition will be monitored carefully while you are receiving this medicine. This medicine can cause serious allergic reactions. To reduce your risk, your health care provider may give you other medicine to take before receiving this one. Be sure to follow the directions from your health care provider. This medicine can affect the results of blood tests to match your blood type. These changes can last for up to 6 months after the final dose. Your healthcare provider will do blood tests to match your blood type before you start treatment. Tell all of your healthcare providers that you are being treated with this medicine before receiving a blood transfusion. This medicine can affect  the results of some tests used to determine treatment response; extra tests may be needed to evaluate response. Do not become pregnant while taking this medicine or for 3 months after stopping it. Women should inform their health care provider if they wish to become pregnant or think they might be pregnant. There is a potential for serious side effects to an unborn child. Talk to your health care provider for more information. Do not breast-feed an infant while taking this medicine. What side effects may I notice from receiving this medicine? Side effects that you should report to your doctor or health care professional as soon as possible:  allergic reactions (skin rash; itching or hives; swelling of the face, lips, or tongue)  infection (fever, chills, cough, sore throat, pain or difficulty passing urine)  infusion reaction (dizziness, fast heartbeat, feeling faint or lightheaded, falls, headache, increase in blood pressure, nausea, vomiting, or wheezing or trouble breathing with loud or whistling sounds)  unusual bleeding or bruising Side effects that usually do not require medical attention (report to your doctor or health care professional if they continue or are bothersome):  constipation  diarrhea  pain, tingling, numbness in the hands or feet  swelling of the ankles, feet, hands  tiredness This list may not describe all possible side effects. Call  your doctor for medical advice about side effects. You may report side effects to FDA at 1-800-FDA-1088. Where should I keep my medicine? This drug is given in a hospital or clinic and will not be stored at home. NOTE: This sheet is a summary. It may not cover all possible information. If you have questions about this medicine, talk to your doctor, pharmacist, or health care provider.  2021 Elsevier/Gold Standard (2020-03-24 13:28:52)  Azacitidine suspension for injection (subcutaneous use) What is this medicine? AZACITIDINE (ay [place]) is a chemotherapy drug. This medicine reduces the growth of cancer cells and can suppress the immune system. It is used for treating myelodysplastic syndrome or some types of leukemia. This medicine may be used for other purposes; ask your health care provider or pharmacist if you have questions. COMMON BRAND NAME(S): Vidaza What should I tell my health care provider before I take this medicine? They need to know if you have any of these conditions:  kidney disease  liver disease  liver tumors  an unusual or allergic reaction to azacitidine, mannitol, other medicines, foods, dyes, or preservatives  pregnant or trying to get pregnant  breast-feeding How should I use this medicine? This medicine is for injection under the skin. It is administered in a hospital or clinic by a specially trained health care professional. Talk to your pediatrician regarding the use of this medicine in children. While this drug may be prescribed for selected conditions, precautions do apply. Overdosage: If you think you have taken too much of this medicine contact a poison control center or emergency room at once. NOTE: This medicine is only for you. Do not share this medicine with others. What if I miss a dose? It is important not to miss your dose. Call your doctor or health care professional if you are unable to keep an appointment. What may interact with this medicine? Interactions have not been studied. Give your health care provider a list of all the medicines, herbs, non-prescription drugs, or dietary supplements you use. Also tell them if you smoke, drink alcohol, or use illegal drugs. Some items may interact with your medicine. This list may not describe all possible interactions. Give your health care provider a list of all the medicines, herbs, non-prescription drugs, or dietary supplements you use. Also tell them if you smoke, drink alcohol, or use illegal drugs. Some items may interact  with your medicine. What should I watch for while using this medicine? Visit your doctor for checks on your progress. This drug may make you feel generally unwell. This is not uncommon, as chemotherapy can affect healthy cells as well as cancer cells. Report any side effects. Continue your course of treatment even though you feel ill unless your doctor tells you to stop. In some cases, you may be given additional medicines to help with side effects. Follow all directions for their use. Call your doctor or health care professional for advice if you get a fever, chills or sore throat, or other symptoms of a cold or flu. Do not treat yourself. This drug decreases your body's ability to fight infections. Try to avoid being around people who are sick. This medicine may increase your risk to bruise or bleed. Call your doctor or health care professional if you notice any unusual bleeding. You may need blood work done while you are taking this medicine. Do not become pregnant while taking this medicine and for 6 months after the last dose. Women should inform their  doctor if they wish to become pregnant or think they might be pregnant. Men should not father a child while taking this medicine and for 3 months after the last dose. There is a potential for serious side effects to an unborn child. Talk to your health care professional or pharmacist for more information. Do not breast-feed an infant while taking this medicine and for 1 week after the last dose. This medicine may interfere with the ability to have a child. Talk with your doctor or health care professional if you are concerned about your fertility. What side effects may I notice from receiving this medicine? Side effects that you should report to your doctor or health care professional as soon as possible:  allergic reactions like skin rash, itching or hives, swelling of the face, lips, or tongue  low blood counts - this medicine may decrease the  number of white blood cells, red blood cells and platelets. You may be at increased risk for infections and bleeding.  signs of infection - fever or chills, cough, sore throat, pain passing urine  signs of decreased platelets or bleeding - bruising, pinpoint red spots on the skin, black, tarry stools, blood in the urine  signs of decreased red blood cells - unusually weak or tired, fainting spells, lightheadedness  signs and symptoms of kidney injury like trouble passing urine or change in the amount of urine  signs and symptoms of liver injury like dark yellow or brown urine; general ill feeling or flu-like symptoms; light-colored stools; loss of appetite; nausea; right upper belly pain; unusually weak or tired; yellowing of the eyes or skin Side effects that usually do not require medical attention (report to your doctor or health care professional if they continue or are bothersome):  constipation  diarrhea  nausea, vomiting  pain or redness at the injection site  unusually weak or tired This list may not describe all possible side effects. Call your doctor for medical advice about side effects. You may report side effects to FDA at 1-800-FDA-1088. Where should I keep my medicine? This drug is given in a hospital or clinic and will not be stored at home. NOTE: This sheet is a summary. It may not cover all possible information. If you have questions about this medicine, talk to your doctor, pharmacist, or health care provider.  2021 Elsevier/Gold Standard (2016-05-01 14:37:51)

## 2020-08-17 NOTE — Progress Notes (Signed)
Patient was prescribed Furosemide for 3 days or until swelling improves by cardiologist for the ankle edema,she took for 5 days before edema improved.  Reports daily nausea that is slightly improved by taking Ondansetron.

## 2020-08-18 ENCOUNTER — Other Ambulatory Visit: Payer: Self-pay

## 2020-08-18 ENCOUNTER — Telehealth: Payer: Self-pay

## 2020-08-18 ENCOUNTER — Other Ambulatory Visit (HOSPITAL_COMMUNITY): Payer: Self-pay

## 2020-08-18 MED ORDER — PROMETHAZINE HCL 25 MG PO TABS: 25.0000 mg | ORAL_TABLET | Freq: Three times a day (TID) | ORAL | 0 refills | Status: DC | PRN

## 2020-08-18 NOTE — Telephone Encounter (Signed)
PA for Promethazine HCL 25mg  has been submitted to [facility] via covermymeds.   KeyFlossie Dibble PA Case ID: [id number] Rx: [id number]

## 2020-08-19 NOTE — Telephone Encounter (Signed)
Received fax from [facility] requesting additional clinical information.  Form was completed and faxed back to [phone] (Phone [phone])

## 2020-08-23 ENCOUNTER — Other Ambulatory Visit: Payer: Self-pay

## 2020-08-23 NOTE — Telephone Encounter (Signed)
She has tried compazine which did not work. Zofran only partially relieve symptoms.

## 2020-08-23 NOTE — Telephone Encounter (Signed)
That information was submitted, but it was still denied. Do you want to try a lower dose ?

## 2020-08-23 NOTE — Telephone Encounter (Signed)
Per covermymeds, Promethazine was denied. Do you want to send something else?

## 2020-08-23 NOTE — Progress Notes (Signed)
Called Psych office to follow up on referral. Spoke to referral coordinator and she states that the referral has been sent to physician for reveiw and determine whether she can be seen there.

## 2020-08-24 ENCOUNTER — Inpatient Hospital Stay (HOSPITAL_BASED_OUTPATIENT_CLINIC_OR_DEPARTMENT_OTHER): Payer: Medicare Other | Admitting: Oncology

## 2020-08-24 ENCOUNTER — Inpatient Hospital Stay: Payer: Medicare Other

## 2020-08-24 ENCOUNTER — Encounter: Payer: Self-pay | Admitting: Oncology

## 2020-08-24 ENCOUNTER — Other Ambulatory Visit: Payer: Self-pay

## 2020-08-24 ENCOUNTER — Ambulatory Visit: Payer: Medicare Other | Admitting: Internal Medicine

## 2020-08-24 VITALS — BP 132/76 | HR 98 | Temp 96.8°F | Resp 18 | Wt 154.0 lb

## 2020-08-24 DIAGNOSIS — E8581 Light chain (AL) amyloidosis: Secondary | ICD-10-CM

## 2020-08-24 DIAGNOSIS — R7401 Elevation of levels of liver transaminase levels: Secondary | ICD-10-CM | POA: Diagnosis not present

## 2020-08-24 DIAGNOSIS — E859 Amyloidosis, unspecified: Secondary | ICD-10-CM

## 2020-08-24 DIAGNOSIS — Z5111 Encounter for antineoplastic chemotherapy: Secondary | ICD-10-CM

## 2020-08-24 DIAGNOSIS — R188 Other ascites: Secondary | ICD-10-CM

## 2020-08-24 DIAGNOSIS — R112 Nausea with vomiting, unspecified: Secondary | ICD-10-CM | POA: Diagnosis not present

## 2020-08-24 DIAGNOSIS — D631 Anemia in chronic kidney disease: Secondary | ICD-10-CM

## 2020-08-24 DIAGNOSIS — R808 Other proteinuria: Secondary | ICD-10-CM

## 2020-08-24 DIAGNOSIS — T451X5A Adverse effect of antineoplastic and immunosuppressive drugs, initial encounter: Secondary | ICD-10-CM

## 2020-08-24 DIAGNOSIS — N1831 Chronic kidney disease, stage 3a: Secondary | ICD-10-CM

## 2020-08-24 LAB — CBC WITH DIFFERENTIAL/PLATELET
Abs Immature Granulocytes: 0.11 10*3/uL — ABNORMAL HIGH (ref 0.00–0.07)
Basophils Absolute: 0 10*3/uL (ref 0.0–0.1)
Basophils Relative: 0 %
Eosinophils Absolute: 0 10*3/uL (ref 0.0–0.5)
Eosinophils Relative: 1 %
HCT: 24.4 % — ABNORMAL LOW (ref 36.0–46.0)
Hemoglobin: 8.3 g/dL — ABNORMAL LOW (ref 12.0–15.0)
Immature Granulocytes: 2 %
Lymphocytes Relative: 26 %
Lymphs Abs: 1.8 10*3/uL (ref 0.7–4.0)
MCH: 25 pg — ABNORMAL LOW (ref 26.0–34.0)
MCHC: 34 g/dL (ref 30.0–36.0)
MCV: 73.5 fL — ABNORMAL LOW (ref 80.0–100.0)
Monocytes Absolute: 0.6 10*3/uL (ref 0.1–1.0)
Monocytes Relative: 9 %
Neutro Abs: 4.5 10*3/uL (ref 1.7–7.7)
Neutrophils Relative %: 62 %
Platelets: 381 10*3/uL (ref 150–400)
RBC: 3.32 MIL/uL — ABNORMAL LOW (ref 3.87–5.11)
RDW: 25.2 % — ABNORMAL HIGH (ref 11.5–15.5)
Smear Review: NORMAL
WBC: 7.1 10*3/uL (ref 4.0–10.5)
nRBC: 1.4 % — ABNORMAL HIGH (ref 0.0–0.2)

## 2020-08-24 LAB — COMPREHENSIVE METABOLIC PANEL
ALT: 51 U/L — ABNORMAL HIGH (ref 0–44)
AST: 74 U/L — ABNORMAL HIGH (ref 15–41)
Albumin: 2 g/dL — ABNORMAL LOW (ref 3.5–5.0)
Alkaline Phosphatase: 1298 U/L — ABNORMAL HIGH (ref 38–126)
Anion gap: 9 (ref 5–15)
BUN: 18 mg/dL (ref 8–23)
CO2: 25 mmol/L (ref 22–32)
Calcium: 8.5 mg/dL — ABNORMAL LOW (ref 8.9–10.3)
Chloride: 99 mmol/L (ref 98–111)
Creatinine, Ser: 1.1 mg/dL — ABNORMAL HIGH (ref 0.44–1.00)
GFR, Estimated: 52 mL/min — ABNORMAL LOW (ref >60)
Glucose, Bld: 295 mg/dL — ABNORMAL HIGH (ref 70–99)
Potassium: 3.8 mmol/L (ref 3.5–5.1)
Sodium: 133 mmol/L — ABNORMAL LOW (ref 135–145)
Total Bilirubin: 3.3 mg/dL — ABNORMAL HIGH (ref 0.3–1.2)
Total Protein: 5.2 g/dL — ABNORMAL LOW (ref 6.5–8.1)

## 2020-08-24 MED ORDER — DEXAMETHASONE 4 MG PO TABS
20.0000 mg | ORAL_TABLET | Freq: Once | ORAL | Status: AC
  Administered 2020-08-24: 20 mg via ORAL
  Filled 2020-08-24: qty 5

## 2020-08-24 MED ORDER — OMEPRAZOLE 20 MG PO CPDR: 20.0000 mg | DELAYED_RELEASE_CAPSULE | Freq: Every day | ORAL | 1 refills | Status: DC

## 2020-08-24 MED ORDER — SODIUM CHLORIDE 0.9 % IV SOLN
150.0000 mg | Freq: Once | INTRAVENOUS | Status: AC
  Administered 2020-08-24: 150 mg via INTRAVENOUS
  Filled 2020-08-24: qty 150

## 2020-08-24 MED ORDER — DIPHENHYDRAMINE HCL 25 MG PO CAPS
50.0000 mg | ORAL_CAPSULE | Freq: Once | ORAL | Status: AC
  Administered 2020-08-24: 50 mg via ORAL
  Filled 2020-08-24: qty 2

## 2020-08-24 MED ORDER — ACETAMINOPHEN 325 MG PO TABS
325.0000 mg | ORAL_TABLET | Freq: Once | ORAL | Status: AC
Start: 2020-08-24 — End: 2020-08-24
  Administered 2020-08-24: 325 mg via ORAL
  Filled 2020-08-24: qty 1

## 2020-08-24 MED ORDER — SODIUM CHLORIDE 0.9 % IV SOLN
Freq: Once | INTRAVENOUS | Status: AC
  Filled 2020-08-24: qty 250

## 2020-08-24 MED ORDER — DARATUMUMAB-HYALURONIDASE-FIHJ 1800-30000 MG-UT/15ML ~~LOC~~ SOLN
1800.0000 mg | Freq: Once | SUBCUTANEOUS | Status: AC
  Administered 2020-08-24: 1800 mg via SUBCUTANEOUS
  Filled 2020-08-24: qty 15

## 2020-08-24 MED ORDER — PALONOSETRON HCL INJECTION 0.25 MG/5ML
0.2500 mg | Freq: Once | INTRAVENOUS | Status: AC
  Administered 2020-08-24: 0.25 mg via INTRAVENOUS
  Filled 2020-08-24: qty 5

## 2020-08-24 MED ORDER — BORTEZOMIB CHEMO SQ INJECTION 3.5 MG (2.5MG/ML)
0.7000 mg/m2 | Freq: Once | INTRAMUSCULAR | Status: AC
  Administered 2020-08-24: 1.25 mg via SUBCUTANEOUS
  Filled 2020-08-24: qty 0.5

## 2020-08-24 NOTE — Progress Notes (Signed)
Patient here for oncology follow-up appointment, expresses  complaints of nausea and constipation

## 2020-08-24 NOTE — Progress Notes (Signed)
Pt received subq dara and velcade in clinic today. Tolerated well. Pt also received IV aloxi and about half of ordered IV emend before PIV access was lost. Unable to obtain new PIV access. Dr Tasia Catchings notified and is aware. Pt has no complaints at d/c.

## 2020-08-24 NOTE — Telephone Encounter (Signed)
Patient's son told Dr. Tasia Catchings at MD appt today that they will pay for this medication out of pocket.

## 2020-08-24 NOTE — Patient Instructions (Addendum)
[facility]     Discharge Instructions: Thank you for choosing [facility] to provide your oncology and hematology care.  If you have a lab appointment with the [facility], please go directly to the [facility] and check in at the registration area.  Wear comfortable clothing and clothing appropriate for easy access to any Portacath or PICC line.   We strive to give you quality time with your provider. You may need to reschedule your appointment if you arrive late (15 or more minutes).  Arriving late affects you and other patients whose appointments are after yours.  Also, if you miss three or more appointments without notifying the office, you may be dismissed from the clinic at the provider's discretion.      For prescription refill requests, have your pharmacy contact our office and allow 72 hours for refills to be completed.    Today you received the following chemotherapy and/or immunotherapy agents - velcade and darzalex      To help prevent nausea and vomiting after your treatment, we encourage you to take your nausea medication as directed.  BELOW ARE SYMPTOMS THAT SHOULD BE REPORTED IMMEDIATELY: . *FEVER GREATER THAN 100.4 F (38 C) OR HIGHER . *CHILLS OR SWEATING . *NAUSEA AND VOMITING THAT IS NOT CONTROLLED WITH YOUR NAUSEA MEDICATION . *UNUSUAL SHORTNESS OF BREATH . *UNUSUAL BRUISING OR BLEEDING . *URINARY PROBLEMS (pain or burning when urinating, or frequent urination) . *BOWEL PROBLEMS (unusual diarrhea, constipation, pain near the anus) . TENDERNESS IN MOUTH AND THROAT WITH OR WITHOUT PRESENCE OF ULCERS (sore throat, sores in mouth, or a toothache) . UNUSUAL RASH, SWELLING OR PAIN  . UNUSUAL VAGINAL DISCHARGE OR ITCHING   Items with * indicate a potential emergency and should be followed up as soon as possible or go to the Emergency Department if any problems should occur.  Please show the CHEMOTHERAPY ALERT CARD or  IMMUNOTHERAPY ALERT CARD at check-in to the Emergency Department and triage nurse.  Should you have questions after your visit or need to cancel or reschedule your appointment, please contact [facility]  [phone] and follow the prompts.  Office hours are 8:00 a.m. to 4:30 p.m. Monday - Friday. Please note that voicemails left after 4:00 p.m. may not be returned until the following business day.  We are closed weekends and major holidays. You have access to a nurse at all times for urgent questions. Please call the main number to the clinic [phone] and follow the prompts.  For any non-urgent questions, you may also contact your provider using MyChart. We now offer e-Visits for anyone 45 and older to request care online for non-urgent symptoms. For details visit [email or web address].   Also download the MyChart app! Go to the app store, search "MyChart", open the app, select [facility], and log in with your MyChart username and password.  Due to Covid, a mask is required upon entering the hospital/clinic. If you do not have a mask, one will be given to you upon arrival. For doctor visits, patients may have 1 support person aged 11 or older with them. For treatment visits, patients cannot have anyone with them due to current Covid guidelines and our immunocompromised population.   Daratumumab injection What is this medicine? DARATUMUMAB (dar a toom ue mab) is a monoclonal antibody. It is used to treat multiple myeloma. This medicine may be used for other purposes; ask your health care provider or pharmacist if  you have questions. COMMON BRAND NAME(S): DARZALEX What should I tell my health care provider before I take this medicine? They need to know if you have any of these conditions:  hereditary fructose intolerance  infection (especially a virus infection such as chickenpox, herpes, or hepatitis B virus)  lung or breathing disease (asthma,  COPD)  an unusual or allergic reaction to daratumumab, sorbitol, other medicines, foods, dyes, or preservatives  pregnant or trying to get pregnant  breast-feeding How should I use this medicine? This medicine is for infusion into a vein. It is given by a health care professional in a hospital or clinic setting. Talk to your pediatrician regarding the use of this medicine in children. Special care may be needed. Overdosage: If you think you have taken too much of this medicine contact a poison control center or emergency room at once. NOTE: This medicine is only for you. Do not share this medicine with others. What if I miss a dose? Keep appointments for follow-up doses as directed. It is important not to miss your dose. Call your doctor or health care professional if you are unable to keep an appointment. What may interact with this medicine? Interactions have not been studied. This list may not describe all possible interactions. Give your health care provider a list of all the medicines, herbs, non-prescription drugs, or dietary supplements you use. Also tell them if you smoke, drink alcohol, or use illegal drugs. Some items may interact with your medicine. What should I watch for while using this medicine? Your condition will be monitored carefully while you are receiving this medicine. This medicine can cause serious allergic reactions. To reduce your risk, your health care provider may give you other medicine to take before receiving this one. Be sure to follow the directions from your health care provider. This medicine can affect the results of blood tests to match your blood type. These changes can last for up to 6 months after the final dose. Your healthcare provider will do blood tests to match your blood type before you start treatment. Tell all of your healthcare providers that you are being treated with this medicine before receiving a blood transfusion. This medicine can affect  the results of some tests used to determine treatment response; extra tests may be needed to evaluate response. Do not become pregnant while taking this medicine or for 3 months after stopping it. Women should inform their health care provider if they wish to become pregnant or think they might be pregnant. There is a potential for serious side effects to an unborn child. Talk to your health care provider for more information. Do not breast-feed an infant while taking this medicine. What side effects may I notice from receiving this medicine? Side effects that you should report to your doctor or health care professional as soon as possible:  allergic reactions (skin rash; itching or hives; swelling of the face, lips, or tongue)  infection (fever, chills, cough, sore throat, pain or difficulty passing urine)  infusion reaction (dizziness, fast heartbeat, feeling faint or lightheaded, falls, headache, increase in blood pressure, nausea, vomiting, or wheezing or trouble breathing with loud or whistling sounds)  unusual bleeding or bruising Side effects that usually do not require medical attention (report to your doctor or health care professional if they continue or are bothersome):  constipation  diarrhea  pain, tingling, numbness in the hands or feet  swelling of the ankles, feet, hands  tiredness This list may not describe all  possible side effects. Call your doctor for medical advice about side effects. You may report side effects to FDA at 1-800-FDA-1088. Where should I keep my medicine? This drug is given in a hospital or clinic and will not be stored at home. NOTE: This sheet is a summary. It may not cover all possible information. If you have questions about this medicine, talk to your doctor, pharmacist, or health care provider.  2021 Elsevier/Gold Standard (2020-03-24 13:28:52)  Bortezomib injection What is this medicine? BORTEZOMIB (bor TEZ oh mib) targets proteins in cancer  cells and stops the cancer cells from growing. It treats multiple myeloma and mantle cell lymphoma. This medicine may be used for other purposes; ask your health care provider or pharmacist if you have questions. COMMON BRAND NAME(S): Velcade What should I tell my health care provider before I take this medicine? They need to know if you have any of these conditions:  dehydration  diabetes (high blood sugar)  heart disease  liver disease  tingling of the fingers or toes or other nerve disorder  an unusual or allergic reaction to bortezomib, mannitol, boron, other medicines, foods, dyes, or preservatives  pregnant or trying to get pregnant  breast-feeding How should I use this medicine? This medicine is injected into a vein or under the skin. It is given by a health care provider in a hospital or clinic setting. Talk to your health care provider about the use of this medicine in children. Special care may be needed. Overdosage: If you think you have taken too much of this medicine contact a poison control center or emergency room at once. NOTE: This medicine is only for you. Do not share this medicine with others. What if I miss a dose? Keep appointments for follow-up doses. It is important not to miss your dose. Call your health care provider if you are unable to keep an appointment. What may interact with this medicine? This medicine may interact with the following medications:  ketoconazole  rifampin This list may not describe all possible interactions. Give your health care provider a list of all the medicines, herbs, non-prescription drugs, or dietary supplements you use. Also tell them if you smoke, drink alcohol, or use illegal drugs. Some items may interact with your medicine. What should I watch for while using this medicine? Your condition will be monitored carefully while you are receiving this medicine. You may need blood work done while you are taking this  medicine. You may get drowsy or dizzy. Do not drive, use machinery, or do anything that needs mental alertness until you know how this medicine affects you. Do not stand up or sit up quickly, especially if you are an older patient. This reduces the risk of dizzy or fainting spells This medicine may increase your risk of getting an infection. Call your health care provider for advice if you get a fever, chills, sore throat, or other symptoms of a cold or flu. Do not treat yourself. Try to avoid being around people who are sick. Check with your health care provider if you have severe diarrhea, nausea, and vomiting, or if you sweat a lot. The loss of too much body fluid may make it dangerous for you to take this medicine. Do not become pregnant while taking this medicine or for 7 months after stopping it. Women should inform their health care provider if they wish to become pregnant or think they might be pregnant. Men should not father a child while taking this medicine  and for 4 months after stopping it. There is a potential for serious harm to an unborn child. Talk to your health care provider for more information. Do not breast-feed an infant while taking this medicine or for 2 months after stopping it. This medicine may make it more difficult to get pregnant or father a child. Talk to your health care provider if you are concerned about your fertility. What side effects may I notice from receiving this medicine? Side effects that you should report to your doctor or health care professional as soon as possible:  allergic reactions (skin rash; itching or hives; swelling of the face, lips, or tongue)  bleeding (bloody or black, tarry stools; red or dark brown urine; spitting up blood or brown material that looks like coffee grounds; red spots on the skin; unusual bruising or bleeding from the eye, gums, or nose)  blurred vision or changes in vision  confusion  constipation  headache  heart  failure (trouble breathing; fast, irregular heartbeat; sudden weight gain; swelling of the ankles, feet, hands)  infection (fever, chills, cough, sore throat, pain or trouble passing urine)  lack or loss of appetite  liver injury (dark yellow or brown urine; general ill feeling or flu-like symptoms; loss of appetite, right upper belly pain; yellowing of the eyes or skin)  low blood pressure (dizziness; feeling faint or lightheaded, falls; unusually weak or tired)  muscle cramps  pain, redness, or irritation at site where injected  pain, tingling, numbness in the hands or feet  seizures  trouble breathing  unusual bruising or bleeding Side effects that usually do not require medical attention (report to your doctor or health care professional if they continue or are bothersome):  diarrhea  nausea  stomach pain  trouble sleeping  vomiting This list may not describe all possible side effects. Call your doctor for medical advice about side effects. You may report side effects to FDA at 1-800-FDA-1088. Where should I keep my medicine? This medicine is given in a hospital or clinic. It will not be stored at home. NOTE: This sheet is a summary. It may not cover all possible information. If you have questions about this medicine, talk to your doctor, pharmacist, or health care provider.  2021 Elsevier/Gold Standard (2020-03-24 13:22:53)  Palonosetron Injection What is this medicine? PALONOSETRON (pal oh NOE se tron) is used to prevent nausea and vomiting caused by chemotherapy. It also helps prevent delayed nausea and vomiting that may occur a few days after your treatment. This medicine may be used for other purposes; ask your health care provider or pharmacist if you have questions. COMMON BRAND NAME(S): Aloxi What should I tell my health care provider before I take this medicine? They need to know if you have any of these conditions:  an unusual or allergic reaction to  palonosetron, dolasetron, granisetron, ondansetron, other medicines, foods, dyes, or preservatives  pregnant or trying to get pregnant  breast-feeding How should I use this medicine? This medicine is for infusion into a vein. It is given by a health care professional in a hospital or clinic setting. Talk to your pediatrician regarding the use of this medicine in children. While this drug may be prescribed for children as young as 1 month for selected conditions, precautions do apply. Overdosage: If you think you have taken too much of this medicine contact a poison control center or emergency room at once. NOTE: This medicine is only for you. Do not share this medicine with others. What  if I miss a dose? This does not apply. What may interact with this medicine?  certain medicines for depression, anxiety, or psychotic disturbances  fentanyl  linezolid  MAOIs like Carbex, Eldepryl, Marplan, Nardil, and Parnate  methylene blue (injected into a vein)  tramadol This list may not describe all possible interactions. Give your health care provider a list of all the medicines, herbs, non-prescription drugs, or dietary supplements you use. Also tell them if you smoke, drink alcohol, or use illegal drugs. Some items may interact with your medicine. What should I watch for while using this medicine? Your condition will be monitored carefully while you are receiving this medicine. What side effects may I notice from receiving this medicine? Side effects that you should report to your doctor or health care professional as soon as possible:  allergic reactions like skin rash, itching or hives, swelling of the face, lips, or tongue  breathing problems  confusion  dizziness  fast, irregular heartbeat  fever and chills  loss of balance or coordination  seizures  sweating  swelling of the hands and feet  tremors  unusually weak or tired Side effects that usually do not require  medical attention (report to your doctor or health care professional if they continue or are bothersome):  constipation or diarrhea  headache This list may not describe all possible side effects. Call your doctor for medical advice about side effects. You may report side effects to FDA at 1-800-FDA-1088. Where should I keep my medicine? This drug is given in a hospital or clinic and will not be stored at home. NOTE: This sheet is a summary. It may not cover all possible information. If you have questions about this medicine, talk to your doctor, pharmacist, or health care provider.  2021 Elsevier/Gold Standard (2013-02-06 10:38:36)  Fosaprepitant injection What is this medicine? FOSAPREPITANT (fos ap RE pi tant) is used together with other medicines to prevent nausea and vomiting caused by cancer treatment (chemotherapy). This medicine may be used for other purposes; ask your health care provider or pharmacist if you have questions. COMMON BRAND NAME(S): Emend What should I tell my health care provider before I take this medicine? They need to know if you have any of these conditions:  liver disease  an unusual or allergic reaction to fosaprepitant, aprepitant, medicines, foods, dyes, or preservatives  pregnant or trying to get pregnant  breast-feeding How should I use this medicine? This medicine is for injection into a vein. It is given by a health care professional in a hospital or clinic setting. Talk to your pediatrician regarding the use of this medicine in children. While this drug may be prescribed for children as young as 6 months for selected conditions, precautions do apply. Overdosage: If you think you have taken too much of this medicine contact a poison control center or emergency room at once. NOTE: This medicine is only for you. Do not share this medicine with others. What if I miss a dose? This does not apply. What may interact with this medicine? Do not take this  medicine with any of these medicines:  cisapride  flibanserin  lomitapide  pimozide This medicine may also interact with the following medications:  diltiazem  female hormones, like estrogens or progestins and birth control pills  medicines for fungal infections like ketoconazole and itraconazole  medicines for HIV  medicines for seizures or to control epilepsy like carbamazepine or phenytoin  medicines used for sleep or anxiety disorders like alprazolam, diazepam, or  midazolam  nefazodone  paroxetine  ranolazine  rifampin  some chemotherapy medications like etoposide, ifosfamide, vinblastine, vincristine  some antibiotics like clarithromycin, erythromycin, troleandomycin  steroid medicines like dexamethasone or methylprednisolone  tolbutamide  warfarin This list may not describe all possible interactions. Give your health care provider a list of all the medicines, herbs, non-prescription drugs, or dietary supplements you use. Also tell them if you smoke, drink alcohol, or use illegal drugs. Some items may interact with your medicine. What should I watch for while using this medicine? Do not take this medicine if you already have nausea and vomiting. Ask your health care provider what to do if you already have nausea. Birth control pills and other methods of hormonal contraception (for example, IUD or patch) may not work properly while you are taking this medicine. Use an extra method of birth control during treatment and for 1 month after your last dose of fosaprepitant. This medicine should not be used continuously for a long time. Visit your doctor or health care professional for regular check-ups. This medicine may change your liver function blood test results. What side effects may I notice from receiving this medicine? Side effects that you should report to your doctor or health care professional as soon as possible:  allergic reactions like skin rash, itching  or hives, swelling of the face, lips, or tongue  breathing problems  changes in heart rhythm  high or low blood pressure  pain, redness, or irritation at site where injected  rectal bleeding  serious dizziness or disorientation, confusion  sharp or severe stomach pain  sharp pain in your leg Side effects that usually do not require medical attention (report to your doctor or health care professional if they continue or are bothersome):  constipation or diarrhea  hair loss  headache  hiccups  loss of appetite  nausea  upset stomach  tiredness This list may not describe all possible side effects. Call your doctor for medical advice about side effects. You may report side effects to FDA at 1-800-FDA-1088. Where should I keep my medicine? This drug is given in a hospital or clinic and will not be stored at home. NOTE: This sheet is a summary. It may not cover all possible information. If you have questions about this medicine, talk to your doctor, pharmacist, or health care provider.  2021 Elsevier/Gold Standard (2016-07-19 12:55:48)   Diphenhydramine capsules or tablets What is this medicine? DIPHENHYDRAMINE (dye fen HYE dra meen) is an antihistamine. It is used to treat the symptoms of an allergic reaction. It is also used to treat Parkinson's disease. This medicine is also used to prevent and to treat motion sickness and as a nighttime sleep aid. This medicine may be used for other purposes; ask your health care provider or pharmacist if you have questions. COMMON BRAND NAME(S): Alka-Seltzer Plus Allergy, Aller-G-Time, Banophen, Benadryl Allergy, Benadryl Allergy Dye Free, Benadryl Allergy Kapgel, Benadryl Allergy Ultratab, Diphedryl, Diphenhist, Genahist, Geri-Dryl, PHARBEDRYL, Q-Dryl, Gretta Began, Valu-Dryl, Vicks ZzzQuil Nightime Sleep-Aid What should I tell my health care provider before I take this medicine? They need to know if you have any of these  conditions:  asthma or lung disease  glaucoma  high blood pressure or heart disease  liver disease  pain or difficulty passing urine  prostate trouble  ulcers or other stomach problems  an unusual or allergic reaction to diphenhydramine, other medicines foods, dyes, or preservatives such as sulfites  pregnant or trying to get pregnant  breast-feeding How should I  use this medicine? Take this medicine by mouth with a full glass of water. Follow the directions on the prescription label. Take your doses at regular intervals. Do not take your medicine more often than directed. To prevent motion sickness start taking this medicine 30 to 60 minutes before you leave. Talk to your pediatrician regarding the use of this medicine in children. Special care may be needed. Patients over 30 years old may have a stronger reaction and need a smaller dose. Overdosage: If you think you have taken too much of this medicine contact a poison control center or emergency room at once. NOTE: This medicine is only for you. Do not share this medicine with others. What if I miss a dose? If you miss a dose, take it as soon as you can. If it is almost time for your next dose, take only that dose. Do not take double or extra doses. What may interact with this medicine? Do not take this medicine with any of the following medications:  MAOIs like Carbex, Eldepryl, Marplan, Nardil, and Parnate This medicine may also interact with the following medications:  alcohol  barbiturates, like phenobarbital  medicines for bladder spasm like oxybutynin, tolterodine  medicines for blood pressure  medicines for depression, anxiety, or psychotic disturbances  medicines for movement abnormalities or Parkinson's disease  medicines for sleep  other medicines for cold, cough or allergy  some medicines for the stomach like chlordiazepoxide, dicyclomine This list may not describe all possible interactions. Give your  health care provider a list of all the medicines, herbs, non-prescription drugs, or dietary supplements you use. Also tell them if you smoke, drink alcohol, or use illegal drugs. Some items may interact with your medicine. What should I watch for while using this medicine? Visit your doctor or health care professional for regular check ups. Tell your doctor if your symptoms do not improve or if they get worse. Your mouth may get dry. Chewing sugarless gum or sucking hard candy, and drinking plenty of water may help. Contact your doctor if the problem does not go away or is severe. This medicine may cause dry eyes and blurred vision. If you wear contact lenses you may feel some discomfort. Lubricating drops may help. See your eye doctor if the problem does not go away or is severe. You may get drowsy or dizzy. Do not drive, use machinery, or do anything that needs mental alertness until you know how this medicine affects you. Do not stand or sit up quickly, especially if you are an older patient. This reduces the risk of dizzy or fainting spells. Alcohol may interfere with the effect of this medicine. Avoid alcoholic drinks. What side effects may I notice from receiving this medicine? Side effects that you should report to your doctor or health care professional as soon as possible:  allergic reactions like skin rash, itching or hives, swelling of the face, lips, or tongue  changes in vision  confused, agitated, nervous  irregular or fast heartbeat  tremor  trouble passing urine  unusual bleeding or bruising  unusually weak or tired Side effects that usually do not require medical attention (report to your doctor or health care professional if they continue or are bothersome):  constipation, diarrhea  drowsy  headache  loss of appetite  stomach upset, vomiting  thick mucous This list may not describe all possible side effects. Call your doctor for medical advice about side  effects. You may report side effects to FDA at 1-800-FDA-1088.  Where should I keep my medicine? Keep out of the reach of children. This medicine can be abused. Keep your medicine in a safe place. Store at room temperature between 15 and 30 degrees C (59 and 86 degrees F). Keep container closed tightly. Throw away any unused medicine after the expiration date. NOTE: This sheet is a summary. It may not cover all possible information. If you have questions about this medicine, talk to your doctor, pharmacist, or health care provider.  2021 [employer] Standard (2019-01-09 10:18:35)  Dexamethasone tablets What is this medicine? DEXAMETHASONE (dex a METH a sone) is a corticosteroid. It is commonly used to treat inflammation of the skin, joints, lungs, and other organs. Common conditions treated include asthma, allergies, and arthritis. It is also used for other conditions, such as blood disorders and diseases of the adrenal glands. This medicine may be used for other purposes; ask your health care provider or pharmacist if you have questions. COMMON BRAND NAME(S): CUSHINGS SYN[employer]OME DI[employer]NOSTIC, Decadron, Dexabliss, DexPak Jr TaperPak, DexPak TaperPak, Dxevo, Hemady, HiDex, TaperDex, ZCORT, Zema-Pak, ZoDex, ZonaCort 11 Day, ZonaCort 7 Day What should I tell my health care provider before I take this medicine? They need to know if you have any of these conditions:  Cushing's syndrome  diabetes  glaucoma  heart disease  high blood pressure  infection like herpes, measles, tuberculosis, or chickenpox  kidney disease  liver disease  mental illness  myasthenia gravis  osteoporosis  previous heart attack  seizures  stomach or intestine problems  thyroid disease  an unusual or allergic reaction to dexamethasone, corticosteroids, other medicines, lactose, foods, dyes, or preservatives  pregnant or trying to get pregnant  breast-feeding How should I use this medicine? Take this  medicine by mouth with a drink of water. Follow the directions on the prescription label. Take it with food or milk to avoid stomach upset. If you are taking this medicine once a day, take it in the morning. Do not take more medicine than you are told to take. Do not suddenly stop taking your medicine because you may develop a severe reaction. Your doctor will tell you how much medicine to take. If your doctor wants you to stop the medicine, the dose may be slowly lowered over time to avoid any side effects. Talk to your pediatrician regarding the use of this medicine in children. Special care may be needed. Patients over 87 years old may have a stronger reaction and need a smaller dose. Overdosage: If you think you have taken too much of this medicine contact a poison control center or emergency room at once. NOTE: This medicine is only for you. Do not share this medicine with others. What if I miss a dose? If you miss a dose, take it as soon as you can. If it is almost time for your next dose, talk to your doctor or health care professional. You may need to miss a dose or take an extra dose. Do not take double or extra doses without advice. What may interact with this medicine? Do not take this medicine with any of the following medications:  live virus vaccines This medicine may also interact with the following medications:  aminoglutethimide  amphotericin B  aspirin and aspirin-like medicines  certain antibiotics like erythromycin, clarithromycin, and troleandomycin  certain antivirals for HIV or hepatitis  certain medicines for seizures like carbamazepine, phenobarbital, phenytoin  certain medicines to treat myasthenia gravis  cholestyramine  cyclosporine  digoxin  diuretics  ephedrine  female  hormones, like estrogen or progestins and birth control pills  insulin or other medicines for diabetes  isoniazid  ketoconazole  medicines that relax muscles for  surgery  mifepristone  NSAIDs, medicines for pain and inflammation, like ibuprofen or naproxen  rifampin  skin tests for allergies  thalidomide  vaccines  warfarin This list may not describe all possible interactions. Give your health care provider a list of all the medicines, herbs, non-prescription drugs, or dietary supplements you use. Also tell them if you smoke, drink alcohol, or use illegal drugs. Some items may interact with your medicine. What should I watch for while using this medicine? Visit your health care professional for regular checks on your progress. Tell your health care professional if your symptoms do not start to get better or if they get worse. Your condition will be monitored carefully while you are receiving this medicine. Wear a medical ID bracelet or chain. Carry a card that describes your disease and details of your medicine and dosage times. This medicine may increase your risk of getting an infection. Call your health care professional for advice if you get a fever, chills, or sore throat, or other symptoms of a cold or flu. Do not treat yourself. Try to avoid being around people who are sick. Call your health care professional if you are around anyone with measles, chickenpox, or if you develop sores or blisters that do not heal properly. If you are going to need surgery or other procedures, tell your doctor or health care professional that you have taken this medicine within the last 12 months. Ask your doctor or health care professional about your diet. You may need to lower the amount of salt you eat. This medicine may increase blood sugar. Ask your healthcare provider if changes in diet or medicines are needed if you have diabetes. What side effects may I notice from receiving this medicine? Side effects that you should report to your doctor or health care professional as soon as possible:  allergic reactions like skin rash, itching or hives, swelling of  the face, lips, or tongue  bloody or black, tarry stools  changes in emotions or moods  changes in vision  confusion, excitement, restlessness  depressed mood  eye pain  hallucinations  fever or chills, cough, sore throat, pain or difficulty passing urine  muscle weakness  severe or sudden stomach or belly pain  signs and symptoms of high blood sugar such as being more thirsty or hungry or having to urinate more than normal. You may also feel very tired or have blurry vision.  signs and symptoms of infection like fever; chills; cough; sore throat; pain or trouble passing urine  swelling of ankles, feet  unusual bruising or bleeding  wounds that do not heal Side effects that usually do not require medical attention (report to your doctor or health care professional if they continue or are bothersome):  increased appetite  increased growth of face or body hair  headache  nausea, vomiting  skin problems, acne, thin and shiny skin  trouble sleeping  weight gain This list may not describe all possible side effects. Call your doctor for medical advice about side effects. You may report side effects to FDA at 1-800-FDA-1088. Where should I keep my medicine? Keep out of the reach of children. Store at room temperature between 20 and 25 degrees C (68 and 77 degrees F). Protect from light. Throw away any unused medicine after the expiration date. NOTE: This sheet is  a summary. It may not cover all possible information. If you have questions about this medicine, talk to your doctor, pharmacist, or health care provider.  2021 [employer] Standard (2018-10-14 14:23:34)

## 2020-08-24 NOTE — Progress Notes (Addendum)
Hematology/Oncology  Follow up note [facility] [phone] [phone]   Patient Care Team: Leonel Ramsay, MD as PCP - General (Infectious Diseases)  REFERRING PROVIDER: Leonel Ramsay, MD  CHIEF COMPLAINTS/REASON FOR VISIT:  Follow-up for amyloidosis  HISTORY OF PRESENTING ILLNESS:   Nancy Rodriguez is a  75 y.o.  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  Work up include protein electrophoresis showed M protein 0.7,   Reviewed her previous medical records via care everywhere.  She was seen by Hematology Oncology at [facility] on 02/14/2017.  07/25/2007  SPEP showed M protein of 0.35, IFE showed IgG lamda 09/22/2007  Bone survey negative.  02/15/14 IgG 930, SPEP M protien 0.22, free kappa light chain 3.59, lamda 2.92, ratio 1.23  Hypercalcemia, resolved after stopping HCTZ.  # Transaminitis 03/28/20 US abdomen showed left liver lobe Complex 1.7 cm cyst  # 04/07/20 MRI abdomen w/wo contrast showed 2 benign liver cysts, 2 nonspecific hypovascular irighr liver lesions, abnormal bone marrow signal of L1 vertebral body. Patient was  advised to proceed with bone marrow biopsy and she declined.  # referred her to established care with GI and was seen on 04/19/20,  # Liver biopsy showed amyloidosis. [place] MS/MS showed AL type # 05/20/20 recommend bone marrow biopsy which is scheduled on 05/24/2020.  Patient changed her mind and ask a biopsy to be canceled.  My team and I had multiple phone discussion with patient's daughter [name] and later with [name].  Patient agreed with bone marrow biopsy and a biopsy was scheduled on 06/09/2020.  05/20/20 NT proBNP  was normal,  TSH slightly increased, normal T4 Normal factor X Normal coags Troponin 19. Refer to  cardiology for evaluation. May need cardiac MRI  # 06/01/2020 2D echo result was reviewed and discussed with patient.  Patient has normal LVEF 60-65%. Mild asymmetric left ventricular hypertrophy.  Left ventricular diastolic parameters are indeterminate. average left ventricular global longitudinal strain is -16.8 %.  # 06/09/2020, bone marrow biopsy showed monoclonal plasmacytosis, 8%, amyloid deposit present.  Absent iron stores. 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 t (14;20) #06/17/2020, further discussed about diagnosis and treatment plan.  Patient declined bone marrow transplant evaluation.  Decision was made to proceed with Dara-CyborD chemotherapy treatments.  INTERVAL HISTORY [name] is a 75 y.o. female who has above history reviewed by me today presents for follow up visit for management of AL amyloidosis Problems and complaints are listed below: Patient presents for the evaluation prior to Dara-CyborD treatment. Patient has had second opinion at [place] with Dr. Tracey Harries who agrees with current treatment plan.  He recommends to lower dexamethasone to 20 mg and decrease premed Tylenol to 325 mg Patient reports nausea, symptoms usually happen after the first 3 days and she uses Zofran with some help.  Appetite is not good. She has lost 3 pounds since last visit.   Review of Systems  Constitutional: Positive for appetite change and unexpected weight change. Negative for chills, fatigue and fever.  HENT:   Negative for hearing loss, nosebleeds and voice change.   Eyes: Negative for eye problems.  Respiratory: Negative for chest tightness and cough.   Cardiovascular: Negative for chest pain and leg swelling.  Gastrointestinal: Positive  for nausea. Negative for abdominal distention, abdominal pain and blood in stool.  Endocrine: Negative for hot flashes.  Genitourinary: Negative for difficulty urinating and frequency.    Musculoskeletal: Negative for arthralgias.  Skin: Positive for itching. Negative for rash.  Neurological: Negative for extremity weakness and numbness.  Hematological: Negative for adenopathy.  Psychiatric/Behavioral: Negative for confusion.    MEDICAL HISTORY:  Past Medical History:  Diagnosis Date  . Anemia    Anemia in chronic kidney disease  . Chronic kidney disease    Stage 3b chronic kidney disease  . Diabetes mellitus without complication ([place])   . Hypercholesterolemia   . Hypertension   . MGUS (monoclonal gammopathy of unknown significance)   . Osteoarthritis   . Rheumatoid arthritis ([place])     SURGICAL HISTORY: Past Surgical History:  Procedure Laterality Date  . HERNIA REPAIR    . PARATHYROIDECTOMY      SOCIAL HISTORY: Social History   Socioeconomic History  . Marital status: Widowed    Spouse name: Not on file  . Number of children: 6  . Years of education: Not on file  . Highest education level: Not on file  Occupational History  . Not on file  Tobacco Use  . Smoking status: Never Smoker  . Smokeless tobacco: Never Used  Vaping Use  . Vaping Use: Never used  Substance and Sexual Activity  . Alcohol use: No  . Drug use: Never  . Sexual activity: Not on file  Other Topics Concern  . Not on file  Social History Narrative  . Not on file   Social Determinants of Health   Financial Resource Strain: Not on file  Food Insecurity: Not on file  Transportation Needs: Not on file  Physical Activity: Not on file  Stress: Not on file  Social Connections: Not on file  Intimate Partner Violence: Not on file    FAMILY HISTORY: Family History  Problem Relation Age of Onset  . Diabetes Daughter   . Diabetes Son   . Dementia Mother   . Cancer Father   . Esophageal cancer Sister   . Brain cancer Brother     ALLERGIES:  is allergic to benazepril, lisinopril, tolmetin, and nsaids.  MEDICATIONS:  Current Outpatient Medications  Medication Sig  Dispense Refill  . acyclovir (ZOVIRAX) 400 MG tablet Take 1 tablet (400 mg total) by mouth 2 (two) times daily. 60 tablet 5  . albuterol (VENTOLIN HFA) 108 (90 Base) MCG/ACT inhaler Inhale 2 puffs into the lungs every 4 (four) hours as needed.    Marland Kitchen amLODipine (NORVASC) 5 MG tablet Take by mouth.    . cyclophosphamide (CYTOXAN) 50 MG capsule TAKE 10 CAPSULES (500 MG TOTAL) BY MOUTH ONCE A WEEK. TAKE WITH BREAKFAST. 40 capsule 3  . docusate sodium (COLACE) 100 MG capsule Take 1 capsule by mouth as needed.    Marland Kitchen EPINEPHrine 0.3 mg/0.3 mL IJ SOAJ injection as needed    . furosemide (LASIX) 20 MG tablet Take 1 tablet (56m) daily for 3 days or until swelling improves 30 tablet 0  . insulin glargine (LANTUS SOLOSTAR) 100 UNIT/ML Solostar Pen Inject 12 Units into the skin 2 (two) times daily. Pt takes in morning and bedtime.    . metoprolol succinate (TOPROL-XL) 50 MG 24 hr tablet TAKE 1 TABLET (50 MG TOTAL) BY MOUTH DAILY IN THE MORNING    . omeprazole (PRILOSEC) 20 MG capsule Take 1 capsule (20 mg total) by mouth daily. 30 capsule 1  . ondansetron (ZOFRAN)  8 MG tablet Take 8 mg by mouth 30 to 60 min prior to Cytoxan administration then take 8 mg twice daily as needed for nausea and vomiting. 30 tablet 1  . potassium chloride (KLOR-CON) 10 MEQ tablet Take 1 tablet (10 mEq total) by mouth daily. 30 tablet 0  . promethazine (PHENERGAN) 25 MG tablet Take 1 tablet (25 mg total) by mouth every 8 (eight) hours as needed for nausea or vomiting. (Patient not taking: Reported on 08/24/2020) 30 tablet 0  . traMADol (ULTRAM) 50 MG tablet Take 1 tablet (50 mg total) by mouth every 6 (six) hours as needed for severe pain. 8 tablet 0  . LORazepam (ATIVAN) 0.5 MG tablet Take 1 tablet (0.5 mg total) by mouth every 6 (six) hours as needed (nausea vomiting). (Patient not taking: Reported on 08/24/2020) 30 tablet 0  . mirtazapine (REMERON) 7.5 MG tablet Take 1 tablet (7.5 mg total) by mouth at bedtime. (Patient not taking:  Reported on 08/24/2020) 30 tablet 2   No current facility-administered medications for this visit.     PHYSICAL EXAMINATION: ECOG PERFORMANCE STATUS: 1 - Symptomatic but completely ambulatory Vitals:   08/24/20 0903  BP: 132/76  Pulse: 98  Resp: 18  Temp: (!) 96.8 F (36 C)  SpO2: 98%   Filed Weights   08/24/20 0903  Weight: 154 lb (69.9 kg)    Physical Exam Constitutional:      General: She is not in acute distress. HENT:     Head: Normocephalic and atraumatic.  Eyes:     General: No scleral icterus. Cardiovascular:     Rate and Rhythm: Normal rate and regular rhythm.     Heart sounds: Murmur heard.    Pulmonary:     Effort: Pulmonary effort is normal. No respiratory distress.     Breath sounds: No wheezing.  Abdominal:     General: Bowel sounds are normal. There is no distension.     Palpations: Abdomen is soft.  Musculoskeletal:        General: Swelling present. No deformity. Normal range of motion.     Cervical back: Normal range of motion and neck supple.     Comments: Bilateral lower extremity 1+ edema  Skin:    General: Skin is warm and dry.     Findings: No erythema or rash.  Neurological:     Mental Status: She is alert and oriented to person, place, and time. Mental status is at baseline.     Cranial Nerves: No cranial nerve deficit.     Coordination: Coordination normal.  Psychiatric:        Mood and Affect: Mood normal.     LABORATORY DATA:  I have reviewed the data as listed Lab Results  Component Value Date   WBC 7.1 08/24/2020   HGB 8.3 (L) 08/24/2020   HCT 24.4 (L) 08/24/2020   MCV 73.5 (L) 08/24/2020   PLT 381 08/24/2020   Recent Labs    09/02/19 1608 03/16/20 1124 08/10/20 1245 08/17/20 1249 08/24/20 0843  NA 138   < > 133* 134* 133*  K 3.6   < > 3.9 3.7 3.8  CL 100   < > 101 101 99  CO2 29   < > _0 GLUCOSE 216*   < > 198* 107* 295*  BUN 24*   < > _1 CREATININE 1.15*   < > 0.89 0.96 1.10*  CALCIUM 9.4   < >  8.2* 8.4* 8.5*  GFRNONAA 47*   < > >60 >60 52*  GFRAA 54*  --   --   --   --   PROT 8.4*   < > 5.0* 5.0* 5.2*  ALBUMIN 3.9   < > 1.8* 1.9* 2.0*  AST 31   < > 82* 82* 74*  ALT 27   < > 52* 50* 51*  ALKPHOS 170*   < > 1,108* 1,199* 1,298*  BILITOT 1.1   < > 3.0* 3.1* 3.3*   < > = values in this interval not displayed.   Iron/TIBC/Ferritin/ %Sat    Component Value Date/Time   IRON 45 09/02/2019 1608   TIBC 416 09/02/2019 1608   FERRITIN 35 09/02/2019 1608   IRONPCTSAT 11 09/02/2019 1608     08/25/2019, platelet count 491, WBC 7.5, hemoglobin 12 Creatinine 1.58, EGFR 37, calcium 10.8, albumin 4.2 Negative hepatitis B surface antigen, hepatitis B core antibody, Negative hepatitis C 08/05/2019, A1c 11.2   RADIOGRAPHIC STUDIES: I have personally reviewed the radiological images as listed and agreed with the findings in the report. US Venous Img Lower Bilateral  Result Date: 07/27/2020 CLINICAL DATA:  Bilateral lower extremity swelling EXAM: BILATERAL LOWER EXTREMITY VENOUS DOPPLER ULTRASOUND TECHNIQUE: Gray-scale sonography with graded compression, as well as color Doppler and duplex ultrasound were performed to evaluate the lower extremity deep venous systems from the level of the common femoral vein and including the common femoral, femoral, profunda femoral, popliteal and calf veins including the posterior tibial, peroneal and gastrocnemius veins when visible. The superficial great saphenous vein was also interrogated. Spectral Doppler was utilized to evaluate flow at rest and with distal augmentation maneuvers in the common femoral, femoral and popliteal veins. COMPARISON:  None. FINDINGS: RIGHT LOWER EXTREMITY Common Femoral Vein: No evidence of thrombus. Normal compressibility, respiratory phasicity and response to augmentation. Saphenofemoral Junction: No evidence of thrombus. Normal compressibility and flow on color Doppler imaging. Profunda Femoral Vein: No evidence of thrombus. Normal  compressibility and flow on color Doppler imaging. Femoral Vein: No evidence of thrombus. Normal compressibility, respiratory phasicity and response to augmentation. Popliteal Vein: No evidence of thrombus. Normal compressibility, respiratory phasicity and response to augmentation. Calf Veins: No evidence of thrombus. Normal compressibility and flow on color Doppler imaging. Other Findings:  Lower extremity subcutaneous edema noted LEFT LOWER EXTREMITY Common Femoral Vein: No evidence of thrombus. Normal compressibility, respiratory phasicity and response to augmentation. Saphenofemoral Junction: No evidence of thrombus. Normal compressibility and flow on color Doppler imaging. Profunda Femoral Vein: No evidence of thrombus. Normal compressibility and flow on color Doppler imaging. Femoral Vein: No evidence of thrombus. Normal compressibility, respiratory phasicity and response to augmentation. Popliteal Vein: No evidence of thrombus. Normal compressibility, respiratory phasicity and response to augmentation. Calf Veins: No evidence of thrombus. Normal compressibility and flow on color Doppler imaging. Other Findings:  Lower extremity subcutaneous edema noted IMPRESSION: No evidence of deep venous thrombosis in either lower extremity. Electronically Signed   By: Jerilynn Mages.  Shick M.D.   On: 07/27/2020 14:28   CT BONE MARROW BIOPSY & ASPIRATION  Result Date: 06/09/2020 INDICATION: 75 year old female with a history of monoclonal gammopathy referred for bone marrow biopsy EXAM: CT BONE MARROW BIOPSY AND ASPIRATION MEDICATIONS: None. ANESTHESIA/SEDATION: Moderate (conscious) sedation was employed during this procedure. A total of Versed 1.0 mg and Fentanyl 50 mcg was administered intravenously. Moderate Sedation Time: 10 minutes. The patient's level of consciousness and vital signs were monitored continuously by radiology nursing throughout the procedure under my direct supervision. FLUOROSCOPY  TIME:  CT COMPLICATIONS: None  PROCEDURE: The procedure risks, benefits, and alternatives were explained to the patient. Questions regarding the procedure were encouraged and answered. The patient understands and consents to the procedure. Scout CT of the pelvis was performed for surgical planning purposes. The right posterior pelvis was prepped with Chlorhexidine in a sterile fashion, and a sterile drape was applied covering the operative field. A sterile gown and sterile gloves were used for the procedure. Local anesthesia was provided with 1% Lidocaine. Right posterior iliac bone was targeted for biopsy. The skin and subcutaneous tissues were infiltrated with 1% lidocaine without epinephrine. A small stab incision was made with an 11 blade scalpel, and an 11 gauge Murphy needle was advanced with CT guidance to the posterior cortex. Manual forced was used to advance the needle through the posterior cortex and the stylet was removed. A bone marrow aspirate was retrieved and passed to a cytotechnologist in the room. The Murphy needle was then advanced without the stylet for a core biopsy. The core biopsy was retrieved and also passed to a cytotechnologist. Manual pressure was used for hemostasis and a sterile dressing was placed. No complications were encountered no significant blood loss was encountered. Patient tolerated the procedure well and remained hemodynamically stable throughout. IMPRESSION: Status post CT-guided bone marrow biopsy, with tissue specimen sent to pathology for complete histopathologic analysis Signed, Dulcy Fanny. Earleen Newport, DO [facility] Radiology Electronically Signed   By: Corrie Mckusick D.O.   On: 06/09/2020 09:58   ECHOCARDIOGRAM LIMITED  Result Date: 06/01/2020    ECHOCARDIOGRAM LIMITED REPORT   Patient Name:   MIEKA LEATON Date of Exam: 06/01/2020 Medical Rec #:  [id number]         Height:       63.5 in Accession #:    [id number]        Weight:       146.5 lb Date of  Birth:  [birth year]          BSA:          1.704 m Patient Age:    89 years          BP:           143/83 mmHg Patient Gender: F                 HR:           84 bpm. Exam Location:  [facility] Procedure: 2D Echo, Cardiac Doppler, Color Doppler and Strain Analysis Indications:     Amyloidosis- unspecified type  History:         Patient has no prior history of Echocardiogram examinations.  Sonographer:     Sherrie Sport RDCS (AE) Referring Phys:  [id number] Marcellous Snarski Diagnosing Phys: Kate Sable MD  Sonographer Comments: Global longitudinal strain was attempted. IMPRESSIONS  1. Left ventricular ejection fraction, by estimation, is 60 to 65%. The left ventricle has normal function. There is mild asymmetric left ventricular hypertrophy of the basal-septal segment. Left ventricular diastolic parameters are indeterminate. The average left ventricular global longitudinal strain is -16.8 %.  2. Right ventricular systolic function is normal.  3. The mitral valve is normal in structure. Mild mitral valve regurgitation.  4. The aortic valve is tricuspid. Aortic valve regurgitation is not visualized. Mild aortic valve sclerosis is present, with no evidence of aortic valve stenosis.  5. The inferior vena cava is normal in size with greater than 50% respiratory variability, suggesting right  atrial pressure of 3 mmHg. Conclusion(s)/Recommendation(s): No echocardiographic findings to suggest cardiac amyloid on this study. FINDINGS  Left Ventricle: Left ventricular ejection fraction, by estimation, is 60 to 65%. The left ventricle has normal function. The average left ventricular global longitudinal strain is -16.8 %. The left ventricular internal cavity size was normal in size. There is mild asymmetric left ventricular hypertrophy of the basal-septal segment. Left ventricular diastolic parameters are indeterminate. Right Ventricle: No increase in right ventricular wall thickness. Right ventricular systolic function is normal. Left Atrium:  Left atrial size was normal in size. Right Atrium: Right atrial size was normal in size. Pericardium: There is no evidence of pericardial effusion. Mitral Valve: The mitral valve is normal in structure. Mild mitral valve regurgitation. Tricuspid Valve: The tricuspid valve is normal in structure. Tricuspid valve regurgitation is not demonstrated. Aortic Valve: The aortic valve is tricuspid. Aortic valve regurgitation is not visualized. Mild aortic valve sclerosis is present, with no evidence of aortic valve stenosis. Aortic valve mean gradient measures 5.0 mmHg. Aortic valve peak gradient measures 8.2 mmHg. Aortic valve area, by VTI measures 1.58 cm. Pulmonic Valve: The pulmonic valve was normal in structure. Pulmonic valve regurgitation is trivial. Aorta: The aortic root is normal in size and structure. Venous: The inferior vena cava is normal in size with greater than 50% respiratory variability, suggesting right atrial pressure of 3 mmHg. LEFT VENTRICLE PLAX 2D LVIDd:         4.28 cm  Diastology LVIDs:         2.44 cm  LV e' medial:    5.66 cm/s LV PW:         1.72 cm  LV E/e' medial:  13.6 LV IVS:        1.01 cm  LV e' lateral:   5.11 cm/s LVOT diam:     2.00 cm  LV E/e' lateral: 15.1 LV SV:         49 LV SV Index:   29       2D Longitudinal Strain LVOT Area:     3.14 cm 2D Strain GLS Avg:     -16.8 %                          3D Volume EF:                         3D EF:        62 %                         LV EDV:       115 ml                         LV ESV:       44 ml                         LV SV:        71 ml RIGHT VENTRICLE RV Basal diam:  3.11 cm RV S prime:     10.10 cm/s TAPSE (M-mode): 3.1 cm LEFT ATRIUM             Index       RIGHT ATRIUM           Index LA diam:        3.80 cm  2.23 cm/m  RA Area:     13.60 cm LA Vol (A2C):   47.7 ml 27.99 ml/m RA Volume:   32.60 ml  19.13 ml/m LA Vol (A4C):   46.4 ml 27.23 ml/m LA Biplane Vol: 49.3 ml 28.93 ml/m  AORTIC VALVE                    PULMONIC VALVE  AV Area (Vmax):    1.62 cm     PV Vmax:        0.65 m/s AV Area (Vmean):   1.56 cm     PV Peak grad:   1.7 mmHg AV Area (VTI):     1.58 cm     RVOT Peak grad: 2 mmHg AV Vmax:           143.00 cm/s AV Vmean:          104.667 cm/s AV VTI:            0.309 m AV Peak Grad:      8.2 mmHg AV Mean Grad:      5.0 mmHg LVOT Vmax:         73.80 cm/s LVOT Vmean:        52.000 cm/s LVOT VTI:          0.156 m LVOT/AV VTI ratio: 0.50  AORTA Ao Root diam: 3.10 cm MITRAL VALVE               TRICUSPID VALVE MV Area (PHT): 4.54 cm    TR Peak grad:   11.2 mmHg MV Decel Time: 167 msec    TR Vmax:        167.00 cm/s MV E velocity: 77.10 cm/s MV A velocity: 72.00 cm/s  SHUNTS MV E/A ratio:  1.07        Systemic VTI:  0.16 m                            Systemic Diam: 2.00 cm Kate Sable MD Electronically signed by Kate Sable MD Signature Date/Time: 06/01/2020/4:06:38 PM    Final       ASSESSMENT & PLAN:  1. Light chain (AL) amyloidosis (HCC)   2. Encounter for antineoplastic chemotherapy   3. 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[place])   4. Chemotherapy induced nausea and vomiting   5. Transaminitis   6. Other ascites   7. Other proteinuria    # AL-amyloidosis Diagnosis of AL amyloidosis was discussed with patient.  Patient has biopsy confirmed liver involvement, possible kidney involvement- Labs reviewed and discussed with patient Proceed with cycle 3  Day 1 Dara CyBorD. Hyperbilirubinemia. Bilirubin 3.3 recommend patient to take 350 mg cyclophosphamide today.. Velcade  0.7 mg/m,  Obtain ultrasound for evaluation of ascites.  #Chemotherapy-induced nausea, likely secondary to the oral cyclophosphamide.  Continue Emend and Aloxi as premed.   Continue home antiemetic regimen-Zofran, Ativan as instructed.  Insurance denied Phenergan 25 mg every 8 hours as needed.  The patient plans to pay out-of-pocket.  Prescription was previously sent to pharmacy.  #Hypokalemia, potassium level 3.8.  Continue  potassium chloride 10 mEq daily.  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  Stable creatinine. Repeat 24-hour urine protein  #Microcytic anemia likely due to iron deficiency, continue oral iron supplementation. #Anxiety, continue Remeron. #History of major depression listed in outside problem list.  previous diagnosis and treatment details are not available to me in EMR  I have referred her to psychiatrist  #Bilateral lower extremity swelling, negative lower extremity venous Doppler to rule out DVT.  Symptoms are better with compression stocking. Cardiology prescribed Lasix 20 mg daily, continue.  Plan was discussed with patient and her son [name]. Supportive care measures are necessary for patient well-being and will be provided as necessary. We spent sufficient time to discuss many aspect of care, questions were answered to patient's satisfaction.  Follow-up in 1 week. cc Leonel Ramsay, MD   [name], MD, PhD Hematology Oncology [facility] Pager- [phone] 08/24/2020

## 2020-08-24 NOTE — Progress Notes (Signed)
Labs reviewed by MD ok to proceed with tx

## 2020-08-25 ENCOUNTER — Telehealth: Payer: Self-pay | Admitting: Internal Medicine

## 2020-08-25 ENCOUNTER — Other Ambulatory Visit: Payer: Self-pay

## 2020-08-25 ENCOUNTER — Ambulatory Visit
Admission: RE | Admit: 2020-08-25 | Discharge: 2020-08-25 | Disposition: A | Discharge status: Home / Self Care | Payer: Medicare Other | Source: Ambulatory Visit | Attending: Oncology | Admitting: Oncology

## 2020-08-25 DIAGNOSIS — R188 Other ascites: Secondary | ICD-10-CM | POA: Insufficient documentation

## 2020-08-25 DIAGNOSIS — E8581 Light chain (AL) amyloidosis: Secondary | ICD-10-CM

## 2020-08-25 LAB — KAPPA/LAMBDA LIGHT CHAINS
Kappa free light chain: 9.9 mg/L (ref 3.3–19.4)
Kappa, lambda light chain ratio: 1.46 (ref 0.26–1.65)
Lambda free light chains: 6.8 mg/L (ref 5.7–26.3)

## 2020-08-25 IMAGING — US US ABDOMEN LIMITED RUQ/ASCITES
1 series · 13 of 13 positions shown · non-contrast
Comparison: None.

CLINICAL DATA: Ascites.

EXAM:
LIMITED ABDOMEN ULTRASOUND FOR ASCITES
TECHNIQUE: Limited ultrasound survey for ascites was performed in all four
abdominal quadrants.

[Series 1: us abdomen limited · 13 of 13 slices shown]
[im 1/13]
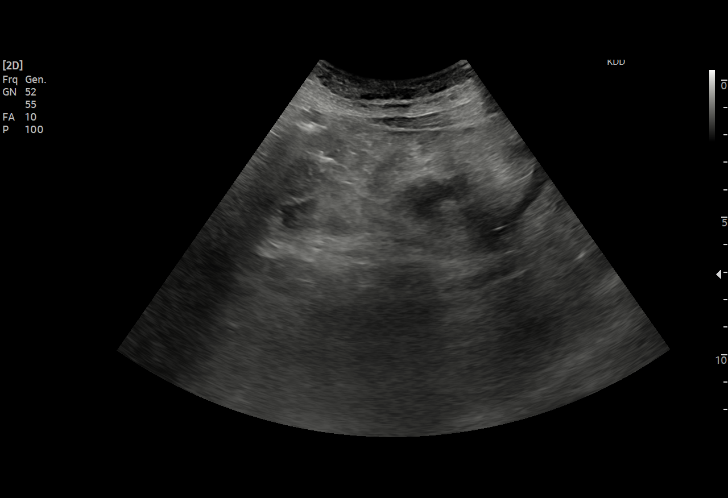
[im 2/13]
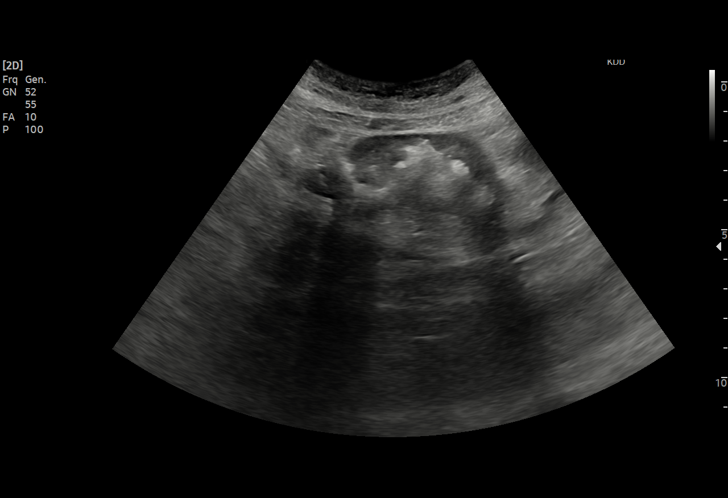
[im 3/13]
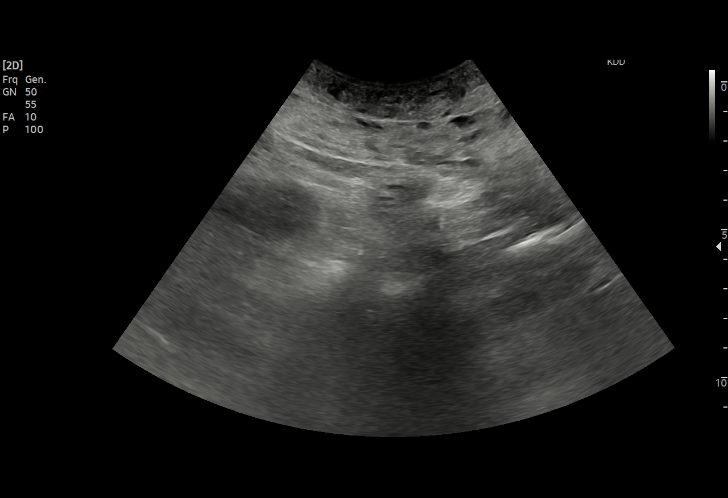
[im 4/13]
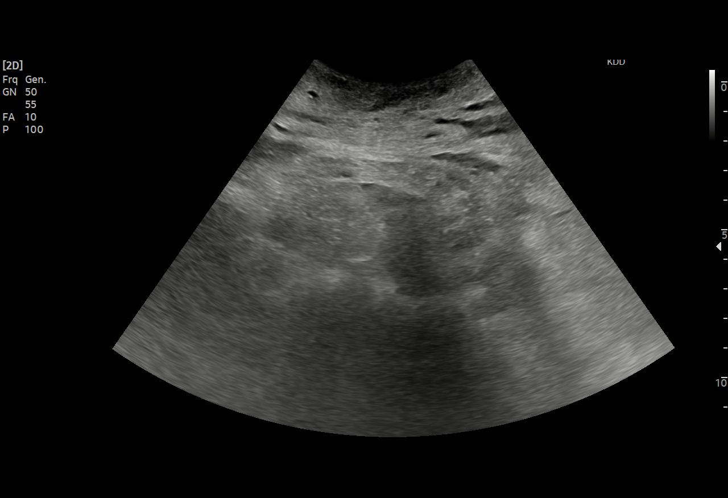
[im 5/13]
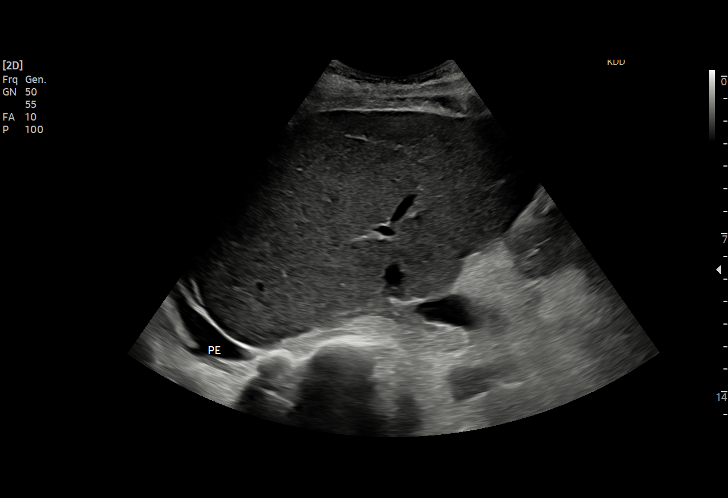
[im 6/13]
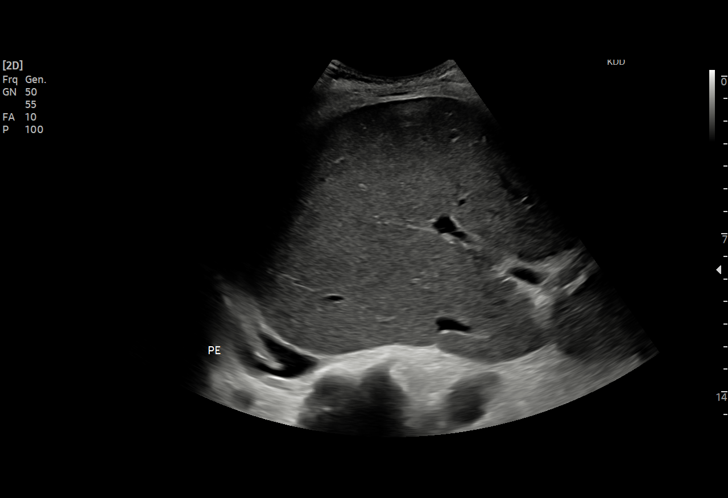
[im 7/13]
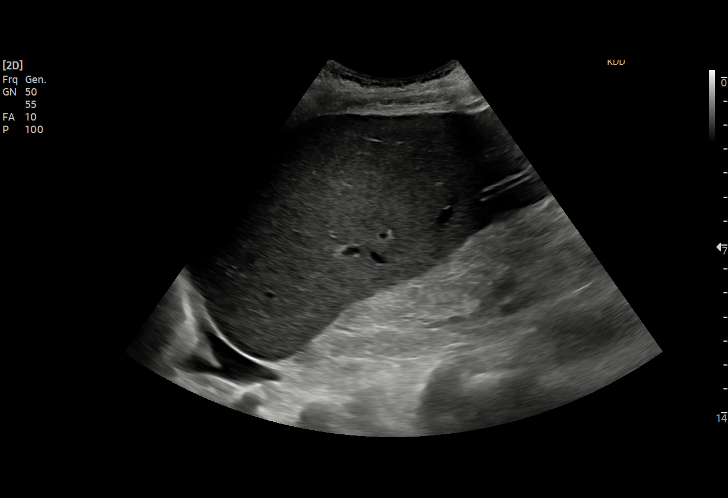
[im 8/13]
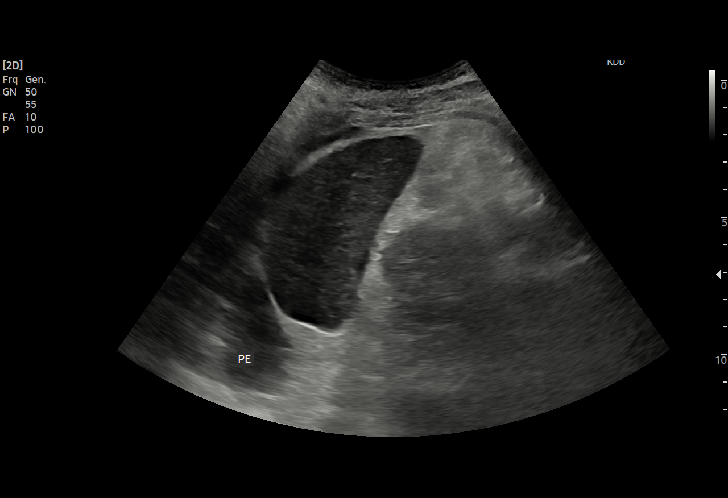
[im 9/13]
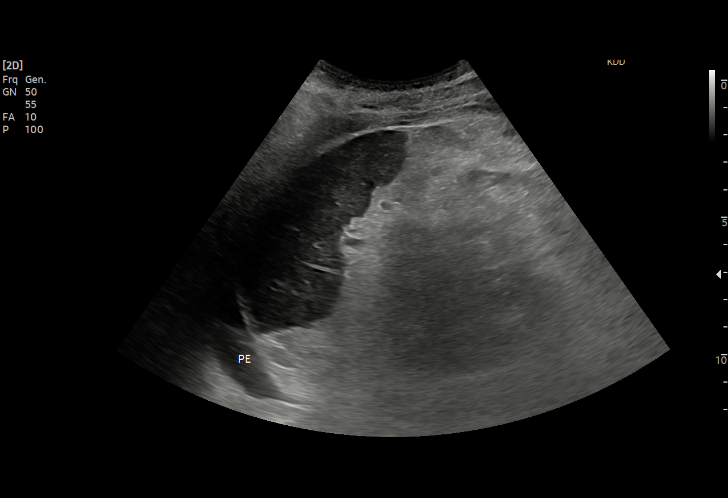
[im 10/13]
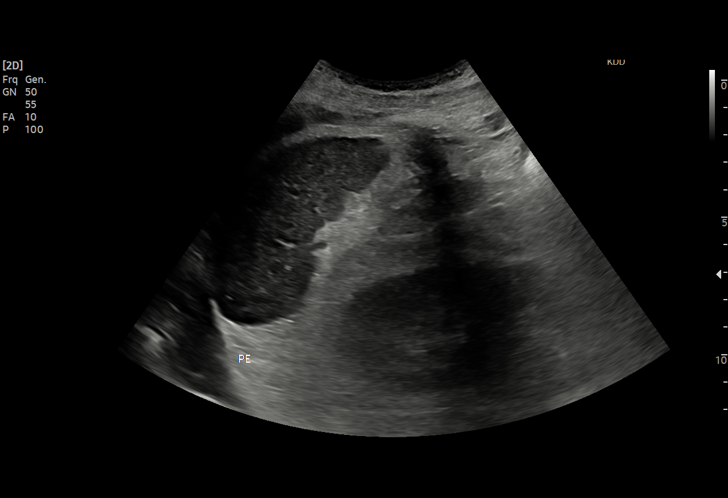
[im 11/13]
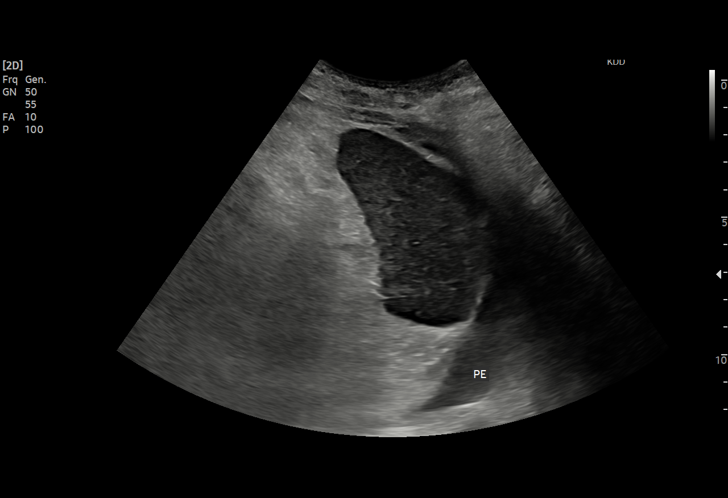
[im 12/13]
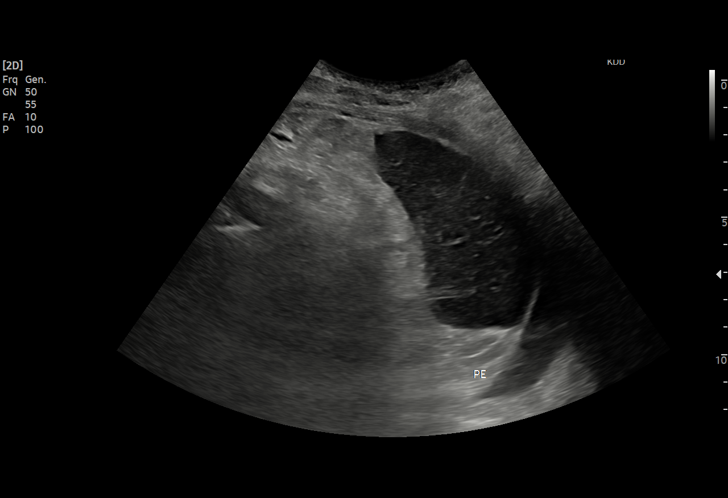
[im 13/13]
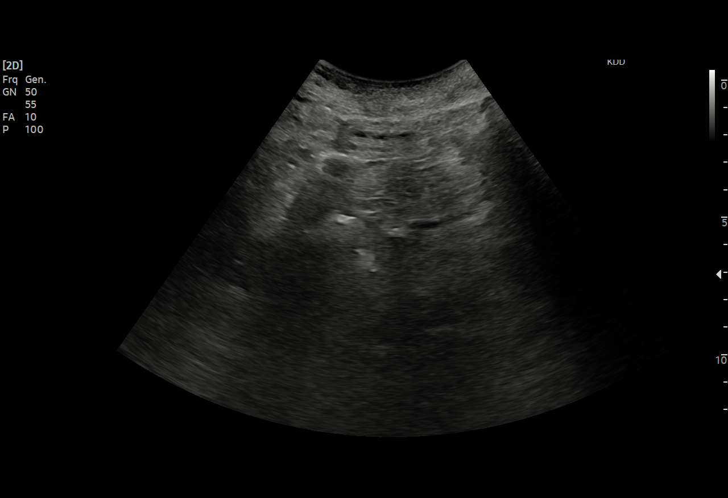

[13 of 13 positions shown; findings below may reference images not displayed]

FINDINGS: 4 quadrant assessment of the abdomen/pelvis demonstrates no
intraperitoneal free fluid. Small bilateral pleural effusions noted
incidentally.
IMPRESSION: No sonographic evidence of ascites.

## 2020-08-25 NOTE — Telephone Encounter (Signed)
  [facility] calling to discuss auth / precert for upcoming cardiac mri on 5/19 ordered by Dr. Saunders Revel.   Please call [phone] ext [phone]

## 2020-08-27 ENCOUNTER — Other Ambulatory Visit: Payer: Self-pay | Admitting: Internal Medicine

## 2020-08-29 LAB — MULTIPLE MYELOMA PANEL, SERUM
Albumin SerPl Elph-Mcnc: 2.1 g/dL — ABNORMAL LOW (ref 2.9–4.4)
Albumin/Glob SerPl: 0.8 (ref 0.7–1.7)
Alpha 1: 0.3 g/dL (ref 0.0–0.4)
Alpha2 Glob SerPl Elph-Mcnc: 0.7 g/dL (ref 0.4–1.0)
B-Globulin SerPl Elph-Mcnc: 1 g/dL (ref 0.7–1.3)
Gamma Glob SerPl Elph-Mcnc: 0.6 g/dL (ref 0.4–1.8)
Globulin, Total: 2.7 g/dL (ref 2.2–3.9)
IgA: 52 mg/dL — ABNORMAL LOW (ref 64–422)
IgG (Immunoglobin G), Serum: 573 mg/dL — ABNORMAL LOW (ref 586–1602)
IgM (Immunoglobulin M), Srm: 69 mg/dL (ref 26–217)
M Protein SerPl Elph-Mcnc: 0.3 g/dL — ABNORMAL HIGH
Total Protein ELP: 4.8 g/dL — ABNORMAL LOW (ref 6.0–8.5)

## 2020-08-30 DIAGNOSIS — Z5111 Encounter for antineoplastic chemotherapy: Secondary | ICD-10-CM | POA: Diagnosis not present

## 2020-08-31 ENCOUNTER — Inpatient Hospital Stay: Payer: Medicare Other

## 2020-08-31 ENCOUNTER — Other Ambulatory Visit: Payer: Self-pay

## 2020-08-31 ENCOUNTER — Inpatient Hospital Stay (HOSPITAL_BASED_OUTPATIENT_CLINIC_OR_DEPARTMENT_OTHER): Payer: Medicare Other | Admitting: Oncology

## 2020-08-31 ENCOUNTER — Encounter: Payer: Self-pay | Admitting: Oncology

## 2020-08-31 VITALS — BP 156/79 | HR 83 | Temp 98.1°F | Resp 18 | Wt 149.7 lb

## 2020-08-31 DIAGNOSIS — E859 Amyloidosis, unspecified: Secondary | ICD-10-CM

## 2020-08-31 DIAGNOSIS — Z5111 Encounter for antineoplastic chemotherapy: Secondary | ICD-10-CM

## 2020-08-31 DIAGNOSIS — R7401 Elevation of levels of liver transaminase levels: Secondary | ICD-10-CM | POA: Diagnosis not present

## 2020-08-31 DIAGNOSIS — R634 Abnormal weight loss: Secondary | ICD-10-CM

## 2020-08-31 DIAGNOSIS — R140 Abdominal distension (gaseous): Secondary | ICD-10-CM

## 2020-08-31 DIAGNOSIS — E8581 Light chain (AL) amyloidosis: Secondary | ICD-10-CM

## 2020-08-31 DIAGNOSIS — N1831 Chronic kidney disease, stage 3a: Secondary | ICD-10-CM | POA: Diagnosis not present

## 2020-08-31 DIAGNOSIS — D631 Anemia in chronic kidney disease: Secondary | ICD-10-CM

## 2020-08-31 DIAGNOSIS — R112 Nausea with vomiting, unspecified: Secondary | ICD-10-CM

## 2020-08-31 DIAGNOSIS — R808 Other proteinuria: Secondary | ICD-10-CM

## 2020-08-31 DIAGNOSIS — T451X5A Adverse effect of antineoplastic and immunosuppressive drugs, initial encounter: Secondary | ICD-10-CM

## 2020-08-31 LAB — IRON AND TIBC
Iron: 35 ug/dL (ref 28–170)
Saturation Ratios: 12 % (ref 10.4–31.8)
TIBC: 290 ug/dL (ref 250–450)
UIBC: 255 ug/dL

## 2020-08-31 LAB — RETIC PANEL
Immature Retic Fract: 41.6 % — ABNORMAL HIGH (ref 2.3–15.9)
RBC.: 3.33 MIL/uL — ABNORMAL LOW (ref 3.87–5.11)
Retic Count, Absolute: 127.5 10*3/uL (ref 19.0–186.0)
Retic Ct Pct: 3.8 % — ABNORMAL HIGH (ref 0.4–3.1)
Reticulocyte Hemoglobin: 29.4 pg (ref >27.9)

## 2020-08-31 LAB — CBC WITH DIFFERENTIAL/PLATELET
Abs Immature Granulocytes: 0.11 10*3/uL — ABNORMAL HIGH (ref 0.00–0.07)
Basophils Absolute: 0 10*3/uL (ref 0.0–0.1)
Basophils Relative: 0 %
Eosinophils Absolute: 0.2 10*3/uL (ref 0.0–0.5)
Eosinophils Relative: 2 %
HCT: 25 % — ABNORMAL LOW (ref 36.0–46.0)
Hemoglobin: 8.4 g/dL — ABNORMAL LOW (ref 12.0–15.0)
Immature Granulocytes: 2 %
Lymphocytes Relative: 20 %
Lymphs Abs: 1.5 10*3/uL (ref 0.7–4.0)
MCH: 25.1 pg — ABNORMAL LOW (ref 26.0–34.0)
MCHC: 33.6 g/dL (ref 30.0–36.0)
MCV: 74.6 fL — ABNORMAL LOW (ref 80.0–100.0)
Monocytes Absolute: 0.8 10*3/uL (ref 0.1–1.0)
Monocytes Relative: 10 %
Neutro Abs: 5 10*3/uL (ref 1.7–7.7)
Neutrophils Relative %: 66 %
Platelets: 315 10*3/uL (ref 150–400)
RBC: 3.35 MIL/uL — ABNORMAL LOW (ref 3.87–5.11)
RDW: 26.4 % — ABNORMAL HIGH (ref 11.5–15.5)
WBC: 7.6 10*3/uL (ref 4.0–10.5)
nRBC: 3.8 % — ABNORMAL HIGH (ref 0.0–0.2)

## 2020-08-31 LAB — COMPREHENSIVE METABOLIC PANEL
ALT: 41 U/L (ref 0–44)
AST: 60 U/L — ABNORMAL HIGH (ref 15–41)
Albumin: 2.2 g/dL — ABNORMAL LOW (ref 3.5–5.0)
Alkaline Phosphatase: 1221 U/L — ABNORMAL HIGH (ref 38–126)
Anion gap: 8 (ref 5–15)
BUN: 21 mg/dL (ref 8–23)
CO2: 25 mmol/L (ref 22–32)
Calcium: 8.6 mg/dL — ABNORMAL LOW (ref 8.9–10.3)
Chloride: 101 mmol/L (ref 98–111)
Creatinine, Ser: 1.19 mg/dL — ABNORMAL HIGH (ref 0.44–1.00)
GFR, Estimated: 47 mL/min — ABNORMAL LOW (ref >60)
Glucose, Bld: 276 mg/dL — ABNORMAL HIGH (ref 70–99)
Potassium: 3.6 mmol/L (ref 3.5–5.1)
Sodium: 134 mmol/L — ABNORMAL LOW (ref 135–145)
Total Bilirubin: 3.7 mg/dL — ABNORMAL HIGH (ref 0.3–1.2)
Total Protein: 5.6 g/dL — ABNORMAL LOW (ref 6.5–8.1)

## 2020-08-31 LAB — FERRITIN: Ferritin: 82 ng/mL (ref 11–307)

## 2020-08-31 MED ORDER — SODIUM CHLORIDE 0.9 % IV SOLN
Freq: Once | INTRAVENOUS | Status: AC
  Filled 2020-08-31: qty 250

## 2020-08-31 MED ORDER — PALONOSETRON HCL INJECTION 0.25 MG/5ML
0.2500 mg | Freq: Once | INTRAVENOUS | Status: AC
  Administered 2020-08-31: 0.25 mg via INTRAVENOUS
  Filled 2020-08-31: qty 5

## 2020-08-31 MED ORDER — BORTEZOMIB CHEMO SQ INJECTION 3.5 MG (2.5MG/ML)
0.7000 mg/m2 | Freq: Once | INTRAMUSCULAR | Status: AC
  Administered 2020-08-31: 1.25 mg via SUBCUTANEOUS
  Filled 2020-08-31: qty 0.5

## 2020-08-31 MED ORDER — FOSAPREPITANT DIMEGLUMINE INJECTION 150 MG
150.0000 mg | Freq: Once | INTRAVENOUS | Status: AC
  Administered 2020-08-31: 150 mg via INTRAVENOUS
  Filled 2020-08-31: qty 150

## 2020-08-31 MED ORDER — DEXAMETHASONE 4 MG PO TABS
20.0000 mg | ORAL_TABLET | Freq: Once | ORAL | Status: AC
  Administered 2020-08-31: 20 mg via ORAL
  Filled 2020-08-31: qty 5

## 2020-08-31 NOTE — Progress Notes (Signed)
Per Dr Tasia Catchings, ok to treat with Bili 3.7, dose already reduced. Continue Dex, emend, aloxi due to N/V from cytoxan which she takes at home.

## 2020-08-31 NOTE — Progress Notes (Signed)
Per Dr. Tasia Catchings labs have been reviewed (including alkaline Phosphatase 1221, Bilirubin 3.7, glucose 276), Per Dr. Tasia Catchings proceed with treatment at this time.  Orders verified per Dr. Tasia Catchings pt to receive Velcade only today, no Darzalex at this time. Per Dr. Tasia Catchings proceed with Emend and Aloxi as ordered as these medications are to help nausea induced by Cytoxan.  Per Dr. Tasia Catchings pt to proceed with " seven 50mg "  PO capsules of Cytoxan at home.   Pt aware and verbalizes understanding.

## 2020-08-31 NOTE — Progress Notes (Signed)
Pt here for follow up. Reports decreasing appetite.

## 2020-08-31 NOTE — Progress Notes (Signed)
Hematology/Oncology  Follow up note [facility] [phone] [phone]   Patient Care Team: Leonel Ramsay, MD as PCP - General (Infectious Diseases)  REFERRING PROVIDER: Leonel Ramsay, MD  CHIEF COMPLAINTS/REASON FOR VISIT:  Follow-up for amyloidosis  HISTORY OF PRESENTING ILLNESS:   Nancy Rodriguez is a  75 y.o.  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  Work up include protein electrophoresis showed M protein 0.7,   Reviewed her previous medical records via care everywhere.  She was seen by Hematology Oncology at [facility] on 02/14/2017.  07/25/2007  SPEP showed M protein of 0.35, IFE showed IgG lamda 09/22/2007  Bone survey negative.  02/15/14 IgG 930, SPEP M protien 0.22, free kappa light chain 3.59, lamda 2.92, ratio 1.23  Hypercalcemia, resolved after stopping HCTZ.  # Transaminitis 03/28/20 US abdomen showed left liver lobe Complex 1.7 cm cyst  # 04/07/20 MRI abdomen w/wo contrast showed 2 benign liver cysts, 2 nonspecific hypovascular irighr liver lesions, abnormal bone marrow signal of L1 vertebral body. Patient was  advised to proceed with bone marrow biopsy and she declined.  # referred her to established care with GI and was seen on 04/19/20,  # Liver biopsy showed amyloidosis. [place] MS/MS showed AL type # 05/20/20 recommend bone marrow biopsy which is scheduled on 05/24/2020.  Patient changed her mind and ask a biopsy to be canceled.  My team and I had multiple phone discussion with patient's daughter Nancy Rodriguez and later with the patient's son Nancy Rodriguez.  Patient agreed with bone marrow biopsy and a biopsy was scheduled on 06/09/2020.  05/20/20 NT proBNP  was normal,  TSH slightly increased, normal T4 Normal factor X Normal coags Troponin 19. Refer to  cardiology for evaluation. May need cardiac MRI  # 06/01/2020 2D echo result was reviewed and discussed with patient.  Patient has normal LVEF 60-65%. Mild asymmetric left ventricular hypertrophy.  Left ventricular diastolic parameters are indeterminate. average left ventricular global longitudinal strain is -16.8 %.  # 06/09/2020, bone marrow biopsy showed monoclonal plasmacytosis, 8%, amyloid deposit present.  Absent iron stores. 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 t (14;20) #06/17/2020, further discussed about diagnosis and treatment plan.  Patient declined bone marrow transplant evaluation.  Decision was made to proceed with Dara-CyborD chemotherapy treatments. #May 2022, second opinion at [facility] with Dr. Tracey Harries.who agrees with current treatment plan.  He recommends to lower dexamethasone to 20 mg and decrease premed Tylenol to 325 mg  INTERVAL HISTORY Nancy Rodriguez is a 75 y.o. female who has above history reviewed by me today presents for follow up visit for management of AL amyloidosis Problems and complaints are listed below: Patient presents for the evaluation prior to Dara-CyborD treatment. Nausea has improved after trying Zofran and Phenergan Bloating and constipation.  She takes Colace as needed. Weight loss, lost 5 pounds since last visit  Review of Systems  Constitutional: Positive for appetite change and unexpected weight change. Negative for chills, fatigue and fever.  HENT:   Negative for hearing loss, nosebleeds and voice change.   Eyes: Negative for eye problems.  Respiratory: Negative for chest tightness and cough.   Cardiovascular: Negative for chest pain and leg swelling.  Gastrointestinal: Positive for nausea. Negative for abdominal distention, abdominal pain and  blood in stool.       Bloating  Endocrine: Negative for hot flashes.  Genitourinary: Negative for difficulty urinating and frequency.   Musculoskeletal:  Negative for arthralgias.  Skin: Positive for itching. Negative for rash.  Neurological: Negative for extremity weakness and numbness.  Hematological: Negative for adenopathy.  Psychiatric/Behavioral: Negative for confusion.    MEDICAL HISTORY:  Past Medical History:  Diagnosis Date  . Anemia    Anemia in chronic kidney disease  . Chronic kidney disease    Stage 3b chronic kidney disease  . Diabetes mellitus without complication ([facility])   . Hypercholesterolemia   . Hypertension   . MGUS (monoclonal gammopathy of unknown significance)   . Osteoarthritis   . Rheumatoid arthritis ([place])     SURGICAL HISTORY: Past Surgical History:  Procedure Laterality Date  . HERNIA REPAIR    . PARATHYROIDECTOMY      SOCIAL HISTORY: Social History   Socioeconomic History  . Marital status: Widowed    Spouse name: Not on file  . Number of children: 6  . Years of education: Not on file  . Highest education level: Not on file  Occupational History  . Not on file  Tobacco Use  . Smoking status: Never Smoker  . Smokeless tobacco: Never Used  Vaping Use  . Vaping Use: Never used  Substance and Sexual Activity  . Alcohol use: No  . Drug use: Never  . Sexual activity: Not on file  Other Topics Concern  . Not on file  Social History Narrative  . Not on file   Social Determinants of Health   Financial Resource Strain: Not on file  Food Insecurity: Not on file  Transportation Needs: Not on file  Physical Activity: Not on file  Stress: Not on file  Social Connections: Not on file  Intimate Partner Violence: Not on file    FAMILY HISTORY: Family History  Problem Relation Age of Onset  . Diabetes Daughter   . Diabetes Son   . Dementia Mother   . Cancer Father   . Esophageal cancer Sister   . Brain cancer Brother     ALLERGIES:  is allergic to benazepril, lisinopril, tolmetin, and nsaids.  MEDICATIONS:  Current Outpatient Medications  Medication Sig Dispense Refill  .  acyclovir (ZOVIRAX) 400 MG tablet Take 1 tablet (400 mg total) by mouth 2 (two) times daily. 60 tablet 5  . albuterol (VENTOLIN HFA) 108 (90 Base) MCG/ACT inhaler Inhale 2 puffs into the lungs every 4 (four) hours as needed.    Marland Kitchen amLODipine (NORVASC) 5 MG tablet Take by mouth.    . cyclophosphamide (CYTOXAN) 50 MG capsule TAKE 10 CAPSULES (500 MG TOTAL) BY MOUTH ONCE A WEEK. TAKE WITH BREAKFAST. 40 capsule 3  . docusate sodium (COLACE) 100 MG capsule Take 1 capsule by mouth as needed.    . furosemide (LASIX) 20 MG tablet Take 1 tablet (39m) daily for 3 days or until swelling improves 30 tablet 0  . hydrOXYzine (ATARAX/VISTARIL) 10 MG tablet Take 10 mg by mouth 2 (two) times daily as needed.    . insulin glargine (LANTUS SOLOSTAR) 100 UNIT/ML Solostar Pen Inject 12 Units into the skin 2 (two) times daily. Pt takes in morning and bedtime.    .Marland KitchenLORazepam (ATIVAN) 0.5 MG tablet Take 1 tablet (0.5 mg total) by mouth every 6 (six) hours as needed (nausea vomiting). 30 tablet 0  . metoprolol succinate (TOPROL-XL) 50 MG 24 hr tablet TAKE 1 TABLET (50 MG TOTAL)  BY MOUTH DAILY IN THE MORNING    . mirtazapine (REMERON) 7.5 MG tablet Take 1 tablet (7.5 mg total) by mouth at bedtime. 30 tablet 2  . omeprazole (PRILOSEC) 20 MG capsule Take 1 capsule (20 mg total) by mouth daily. 30 capsule 1  . ondansetron (ZOFRAN) 8 MG tablet Take 8 mg by mouth 30 to 60 min prior to Cytoxan administration then take 8 mg twice daily as needed for nausea and vomiting. 30 tablet 1  . potassium chloride (KLOR-CON) 10 MEQ tablet Take 1 tablet (10 mEq total) by mouth daily. 30 tablet 0  . promethazine (PHENERGAN) 25 MG tablet Take 1 tablet (25 mg total) by mouth every 8 (eight) hours as needed for nausea or vomiting. 30 tablet 0  . traMADol (ULTRAM) 50 MG tablet Take 1 tablet (50 mg total) by mouth every 6 (six) hours as needed for severe pain. 8 tablet 0  . EPINEPHrine 0.3 mg/0.3 mL IJ SOAJ injection as needed (Patient not taking:  Reported on 08/31/2020)     No current facility-administered medications for this visit.     PHYSICAL EXAMINATION: ECOG PERFORMANCE STATUS: 1 - Symptomatic but completely ambulatory Vitals:   08/31/20 1105  BP: (!) 156/79  Pulse: 83  Resp: 18  Temp: 98.1 F (36.7 C)   Filed Weights   08/31/20 1105  Weight: 149 lb 11.2 oz (67.9 kg)    Physical Exam Constitutional:      General: She is not in acute distress. HENT:     Head: Normocephalic and atraumatic.  Eyes:     General: No scleral icterus. Cardiovascular:     Rate and Rhythm: Normal rate and regular rhythm.     Heart sounds: Murmur heard.    Pulmonary:     Effort: Pulmonary effort is normal. No respiratory distress.     Breath sounds: No wheezing.  Abdominal:     General: Bowel sounds are normal. There is no distension.     Palpations: Abdomen is soft.  Musculoskeletal:        General: Swelling present. No deformity. Normal range of motion.     Cervical back: Normal range of motion and neck supple.     Comments: Bilateral lower extremity 1+ edema  Skin:    General: Skin is warm and dry.     Findings: No erythema or rash.  Neurological:     Mental Status: She is alert and oriented to person, place, and time. Mental status is at baseline.     Cranial Nerves: No cranial nerve deficit.     Coordination: Coordination normal.  Psychiatric:        Mood and Affect: Mood normal.     LABORATORY DATA:  I have reviewed the data as listed Lab Results  Component Value Date   WBC 7.6 08/31/2020   HGB 8.4 (L) 08/31/2020   HCT 25.0 (L) 08/31/2020   MCV 74.6 (L) 08/31/2020   PLT 315 08/31/2020   Recent Labs    09/02/19 1608 03/16/20 1124 08/17/20 1249 08/24/20 0843 08/31/20 1037  NA 138   < > 134* 133* 134*  K 3.6   < > 3.7 3.8 3.6  CL 100   < > 101 99 101  CO2 29   < > _0 GLUCOSE 216*   < > 107* 295* 276*  BUN 24*   < > _1 CREATININE 1.15*   < > 0.96 1.10* 1.19*  CALCIUM 9.4   < >  8.4* 8.5*  8.6*  GFRNONAA 47*   < > >60 52* 47*  GFRAA 54*  --   --   --   --   PROT 8.4*   < > 5.0* 5.2* 5.6*  ALBUMIN 3.9   < > 1.9* 2.0* 2.2*  AST 31   < > 82* 74* 60*  ALT 27   < > 50* 51* 41  ALKPHOS 170*   < > 1,199* 1,298* 1,221*  BILITOT 1.1   < > 3.1* 3.3* 3.7*   < > = values in this interval not displayed.   Iron/TIBC/Ferritin/ %Sat    Component Value Date/Time   IRON 35 08/31/2020 1037   TIBC 290 08/31/2020 1037   FERRITIN 82 08/31/2020 1037   IRONPCTSAT 12 08/31/2020 1037     08/25/2019, platelet count 491, WBC 7.5, hemoglobin 12 Creatinine 1.58, EGFR 37, calcium 10.8, albumin 4.2 Negative hepatitis B surface antigen, hepatitis B core antibody, Negative hepatitis C 08/05/2019, A1c 11.2   RADIOGRAPHIC STUDIES: I have personally reviewed the radiological images as listed and agreed with the findings in the report. US Abdomen Limited  Result Date: 08/25/2020 CLINICAL DATA:  Ascites. EXAM: LIMITED ABDOMEN ULTRASOUND FOR ASCITES TECHNIQUE: Limited ultrasound survey for ascites was performed in all four abdominal quadrants. COMPARISON:  None. FINDINGS: 4 quadrant assessment of the abdomen/pelvis demonstrates no intraperitoneal free fluid. Small bilateral pleural effusions noted incidentally. IMPRESSION: No sonographic evidence of ascites. Electronically Signed   By: Misty Stanley M.D.   On: 08/25/2020 09:32   US Venous Img Lower Bilateral  Result Date: 07/27/2020 CLINICAL DATA:  Bilateral lower extremity swelling EXAM: BILATERAL LOWER EXTREMITY VENOUS DOPPLER ULTRASOUND TECHNIQUE: Gray-scale sonography with graded compression, as well as color Doppler and duplex ultrasound were performed to evaluate the lower extremity deep venous systems from the level of the common femoral vein and including the common femoral, femoral, profunda femoral, popliteal and calf veins including the posterior tibial, peroneal and gastrocnemius veins when visible. The superficial great saphenous vein was also  interrogated. Spectral Doppler was utilized to evaluate flow at rest and with distal augmentation maneuvers in the common femoral, femoral and popliteal veins. COMPARISON:  None. FINDINGS: RIGHT LOWER EXTREMITY Common Femoral Vein: No evidence of thrombus. Normal compressibility, respiratory phasicity and response to augmentation. Saphenofemoral Junction: No evidence of thrombus. Normal compressibility and flow on color Doppler imaging. Profunda Femoral Vein: No evidence of thrombus. Normal compressibility and flow on color Doppler imaging. Femoral Vein: No evidence of thrombus. Normal compressibility, respiratory phasicity and response to augmentation. Popliteal Vein: No evidence of thrombus. Normal compressibility, respiratory phasicity and response to augmentation. Calf Veins: No evidence of thrombus. Normal compressibility and flow on color Doppler imaging. Other Findings:  Lower extremity subcutaneous edema noted LEFT LOWER EXTREMITY Common Femoral Vein: No evidence of thrombus. Normal compressibility, respiratory phasicity and response to augmentation. Saphenofemoral Junction: No evidence of thrombus. Normal compressibility and flow on color Doppler imaging. Profunda Femoral Vein: No evidence of thrombus. Normal compressibility and flow on color Doppler imaging. Femoral Vein: No evidence of thrombus. Normal compressibility, respiratory phasicity and response to augmentation. Popliteal Vein: No evidence of thrombus. Normal compressibility, respiratory phasicity and response to augmentation. Calf Veins: No evidence of thrombus. Normal compressibility and flow on color Doppler imaging. Other Findings:  Lower extremity subcutaneous edema noted IMPRESSION: No evidence of deep venous thrombosis in either lower extremity. Electronically Signed   By: Jerilynn Mages.  Shick M.D.   On: 07/27/2020 14:28   CT BONE MARROW BIOPSY &  ASPIRATION  Result Date: 06/09/2020 INDICATION: 75 year old female with a history of monoclonal  gammopathy referred for bone marrow biopsy EXAM: CT BONE MARROW BIOPSY AND ASPIRATION MEDICATIONS: None. ANESTHESIA/SEDATION: Moderate (conscious) sedation was employed during this procedure. A total of Versed 1.0 mg and Fentanyl 50 mcg was administered intravenously. Moderate Sedation Time: 10 minutes. The patient's level of consciousness and vital signs were monitored continuously by radiology nursing throughout the procedure under my direct supervision. FLUOROSCOPY TIME:  CT COMPLICATIONS: None PROCEDURE: The procedure risks, benefits, and alternatives were explained to the patient. Questions regarding the procedure were encouraged and answered. The patient understands and consents to the procedure. Scout CT of the pelvis was performed for surgical planning purposes. The right posterior pelvis was prepped with Chlorhexidine in a sterile fashion, and a sterile drape was applied covering the operative field. A sterile gown and sterile gloves were used for the procedure. Local anesthesia was provided with 1% Lidocaine. Right posterior iliac bone was targeted for biopsy. The skin and subcutaneous tissues were infiltrated with 1% lidocaine without epinephrine. A small stab incision was made with an 11 blade scalpel, and an 11 gauge Murphy needle was advanced with CT guidance to the posterior cortex. Manual forced was used to advance the needle through the posterior cortex and the stylet was removed. A bone marrow aspirate was retrieved and passed to a cytotechnologist in the room. The Murphy needle was then advanced without the stylet for a core biopsy. The core biopsy was retrieved and also passed to a cytotechnologist. Manual pressure was used for hemostasis and a sterile dressing was placed. No complications were encountered no significant blood loss was encountered. Patient tolerated the procedure well and remained hemodynamically stable throughout. IMPRESSION: Status post CT-guided bone marrow biopsy, with tissue  specimen sent to pathology for complete histopathologic analysis Signed, Dulcy Fanny. Earleen Newport, DO [facility] Radiology Electronically Signed   By: Corrie Mckusick D.O.   On: 06/09/2020 09:58      ASSESSMENT & PLAN:  1. Light chain (AL) amyloidosis (HCC)   2. Encounter for antineoplastic chemotherapy   3. 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[place])   4. Chemotherapy induced nausea and vomiting   5. Transaminitis   6. Other proteinuria   7. Abdominal distension   8. Loss of weight    # AL-amyloidosis Diagnosis of AL amyloidosis was discussed with patient.  Patient has biopsy confirmed liver involvement, possible kidney involvement- Labs are reviewed Proceed with cycle 3  Day 8 CyBorD- Hyperbilirubinemia and abnormal liver function. Bilirubin 3.7 recommend patient to take 350 mg cyclophosphamide today.. Velcade  0.7 mg/m,   #Abdominal bloating.  I will obtain CT abdomen pelvis without contrast #Constipation, advised patient to take Colace 100 mg daily.  If needed, can go up to 100 mg twice daily.  #Chemotherapy-induced nausea, likely secondary to the oral cyclophosphamide.  Continue Emend and Aloxi as premed.  Continue home antiemetic regimen-Zofran, Ativan as instructed. Phenergan 25 mg every 8 hours as needed.    #Hypokalemia, potassium level 3.6.  Continue potassium chloride 10 mEq daily.  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  Stable creatinine. Repeat 24-hour urine protein  #Microcytic anemia likely due to iron deficiency, continue oral iron supplementation. #Anxiety, continue Remeron. #History of major depression listed in outside problem list.  previous diagnosis and treatment details are not available to me in EMR  I have referred her to psychiatrist  #Bilateral lower extremity swelling, neg DVT.  Cardiology prescribed Lasix 20 mg daily, continue compression stocking.  Plan was discussed  with patient and her son Nancy Rodriguez. Supportive care measures are necessary for patient well-being and will be provided as necessary. We spent sufficient time to discuss many aspect of care, questions were answered to patient's satisfaction.  Follow-up in 1 week. cc Leonel Ramsay, MD   [name], MD, PhD Hematology Oncology [facility] Pager- [phone] 08/31/2020

## 2020-08-31 NOTE — Patient Instructions (Signed)
CANCER CENTER [place] [facility]  Discharge Instructions: Thank you for choosing [facility] to provide your oncology and hematology care.  If you have a lab appointment with the Cancer Center, please go directly to the Cancer Center and check in at the registration area.  Wear comfortable clothing and clothing appropriate for easy access to any Portacath or PICC line.   We strive to give you quality time with your provider. You may need to reschedule your appointment if you arrive late (15 or more minutes).  Arriving late affects you and other patients whose appointments are after yours.  Also, if you miss three or more appointments without notifying the office, you may be dismissed from the clinic at the provider's discretion.      For prescription refill requests, have your pharmacy contact our office and allow 72 hours for refills to be completed.    Today you received the following chemotherapy and/or immunotherapy agents Velcade      To help prevent nausea and vomiting after your treatment, we encourage you to take your nausea medication as directed.  BELOW ARE SYMPTOMS THAT SHOULD BE REPORTED IMMEDIATELY: *FEVER GREATER THAN 100.4 F (38 C) OR HIGHER *CHILLS OR SWEATING *NAUSEA AND VOMITING THAT IS NOT CONTROLLED WITH YOUR NAUSEA MEDICATION *UNUSUAL SHORTNESS OF BREATH *UNUSUAL BRUISING OR BLEEDING *URINARY PROBLEMS (pain or burning when urinating, or frequent urination) *BOWEL PROBLEMS (unusual diarrhea, constipation, pain near the anus) TENDERNESS IN MOUTH AND THROAT WITH OR WITHOUT PRESENCE OF ULCERS (sore throat, sores in mouth, or a toothache) UNUSUAL RASH, SWELLING OR PAIN  UNUSUAL VAGINAL DISCHARGE OR ITCHING   Items with * indicate a potential emergency and should be followed up as soon as possible or go to the Emergency Department if any problems should occur.  Please show the CHEMOTHERAPY ALERT CARD or IMMUNOTHERAPY ALERT CARD at check-in to  the Emergency Department and triage nurse.  Should you have questions after your visit or need to cancel or reschedule your appointment, please contact CANCER CENTER [place] [facility]  [phone] and follow the prompts.  Office hours are 8:00 a.m. to 4:30 p.m. Monday - Friday. Please note that voicemails left after 4:00 p.m. may not be returned until the following business day.  We are closed weekends and major holidays. You have access to a nurse at all times for urgent questions. Please call the main number to the clinic [phone] and follow the prompts.  For any non-urgent questions, you may also contact your provider using MyChart. We now offer e-Visits for anyone 18 and older to request care online for non-urgent symptoms. For details visit [email or web address].   Also download the MyChart app! Go to the app store, search "MyChart", open the app, select , and log in with your MyChart username and password.  Due to Covid, a mask is required upon entering the hospital/clinic. If you do not have a mask, one will be given to you upon arrival. For doctor visits, patients may have 1 support person aged 18 or older with them. For treatment visits, patients cannot have anyone with them due to current Covid guidelines and our immunocompromised population.  

## 2020-09-01 ENCOUNTER — Other Ambulatory Visit: Payer: Self-pay

## 2020-09-01 DIAGNOSIS — E8581 Light chain (AL) amyloidosis: Secondary | ICD-10-CM

## 2020-09-01 DIAGNOSIS — Z5111 Encounter for antineoplastic chemotherapy: Secondary | ICD-10-CM

## 2020-09-01 DIAGNOSIS — T451X5A Adverse effect of antineoplastic and immunosuppressive drugs, initial encounter: Secondary | ICD-10-CM

## 2020-09-01 DIAGNOSIS — R808 Other proteinuria: Secondary | ICD-10-CM

## 2020-09-01 DIAGNOSIS — R112 Nausea with vomiting, unspecified: Secondary | ICD-10-CM

## 2020-09-01 DIAGNOSIS — N1831 Chronic kidney disease, stage 3a: Secondary | ICD-10-CM

## 2020-09-01 DIAGNOSIS — D631 Anemia in chronic kidney disease: Secondary | ICD-10-CM

## 2020-09-01 LAB — PROTEIN, URINE, 24 HOUR
Collection Interval-UPROT: 24 hours
Protein, 24H Urine: 679 mg/d — ABNORMAL HIGH (ref 50–100)
Protein, Urine: 97 mg/dL
Urine Total Volume-UPROT: 700 mL

## 2020-09-02 ENCOUNTER — Encounter: Payer: Self-pay | Admitting: Internal Medicine

## 2020-09-02 ENCOUNTER — Ambulatory Visit (INDEPENDENT_AMBULATORY_CARE_PROVIDER_SITE_OTHER): Payer: Medicare Other | Admitting: Internal Medicine

## 2020-09-02 ENCOUNTER — Other Ambulatory Visit: Payer: Self-pay

## 2020-09-02 ENCOUNTER — Ambulatory Visit
Admission: RE | Admit: 2020-09-02 | Discharge: 2020-09-02 | Disposition: A | Discharge status: Home / Self Care | Payer: Medicare Other | Source: Ambulatory Visit | Attending: Oncology | Admitting: Oncology

## 2020-09-02 VITALS — BP 130/62 | HR 82 | Ht 63.0 in | Wt 150.0 lb

## 2020-09-02 DIAGNOSIS — R634 Abnormal weight loss: Secondary | ICD-10-CM | POA: Diagnosis present

## 2020-09-02 DIAGNOSIS — C9000 Multiple myeloma not having achieved remission: Secondary | ICD-10-CM | POA: Diagnosis not present

## 2020-09-02 DIAGNOSIS — E8581 Light chain (AL) amyloidosis: Secondary | ICD-10-CM | POA: Diagnosis not present

## 2020-09-02 DIAGNOSIS — I10 Essential (primary) hypertension: Secondary | ICD-10-CM | POA: Diagnosis not present

## 2020-09-02 DIAGNOSIS — R140 Abdominal distension (gaseous): Secondary | ICD-10-CM

## 2020-09-02 DIAGNOSIS — R601 Generalized edema: Secondary | ICD-10-CM

## 2020-09-02 IMAGING — CT CT ABD-PELV W/O CM
2 of 4 series · 16 of 46 positions shown, 18 images · non-contrast
Comparison: MRI abdomen [DATE]

CLINICAL DATA: Light chain amyloidosis. Weight loss. Abdominal
distension.

EXAM:
CT ABDOMEN AND PELVIS WITHOUT CONTRAST
TECHNIQUE: Multidetector CT imaging of the abdomen and pelvis was performed
following the standard protocol without IV contrast.

[Series 2: routine abd/pel wo · axial · 0.71mm/px · z∈[-977,-527]mm · 13 of 100 slices shown, 15 images]
[im 5/100  soft-tissue]
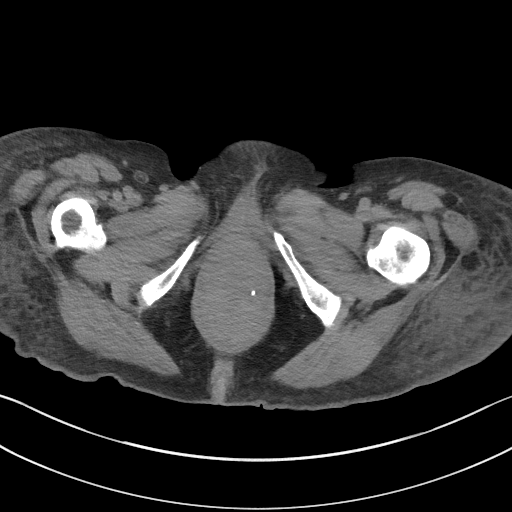
[im 5/100  bone]
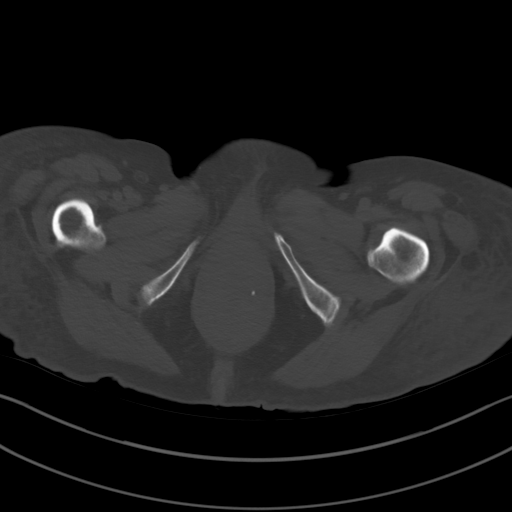
[im 13/100  soft-tissue]
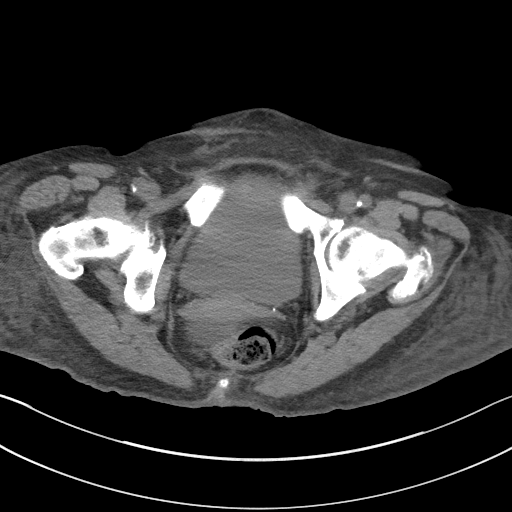
[im 21/100  soft-tissue]
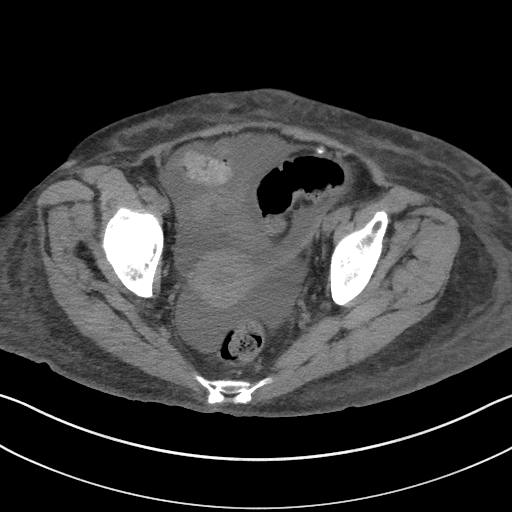
[im 29/100  soft-tissue]
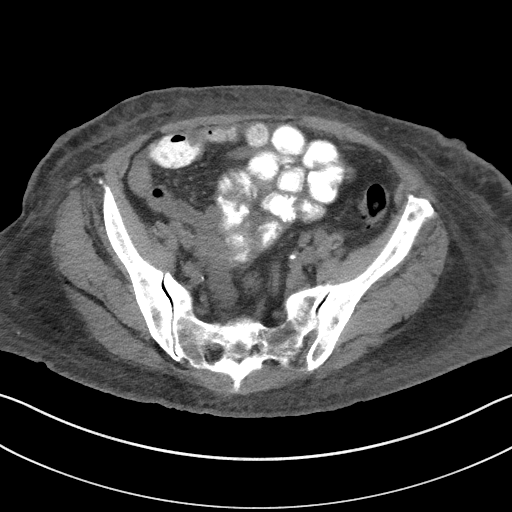
[im 34/100  soft-tissue]
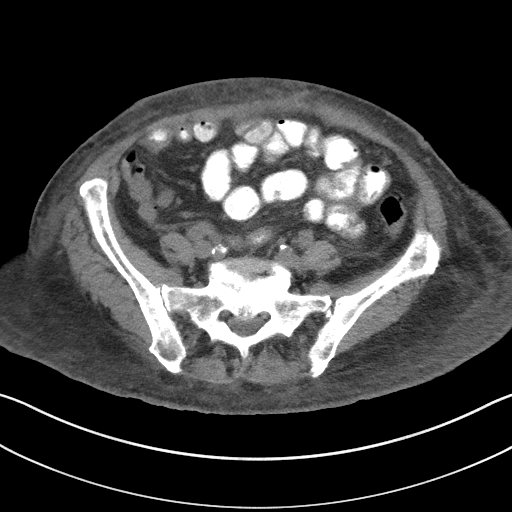
[im 42/100  soft-tissue]
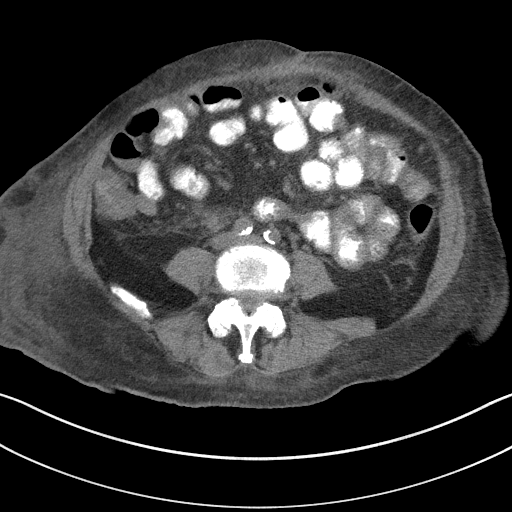
[im 50/100  soft-tissue]
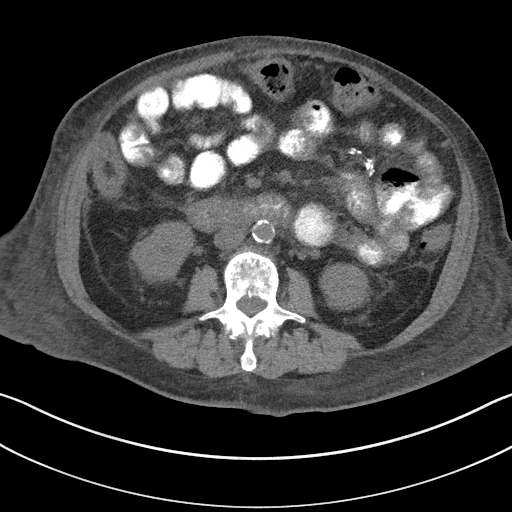
[im 58/100  soft-tissue]
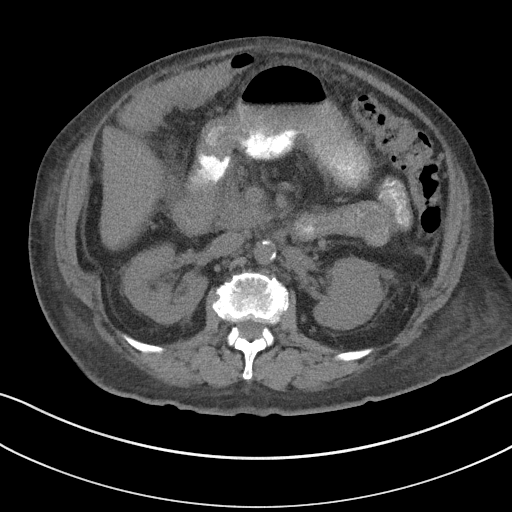
[im 67/100  soft-tissue]
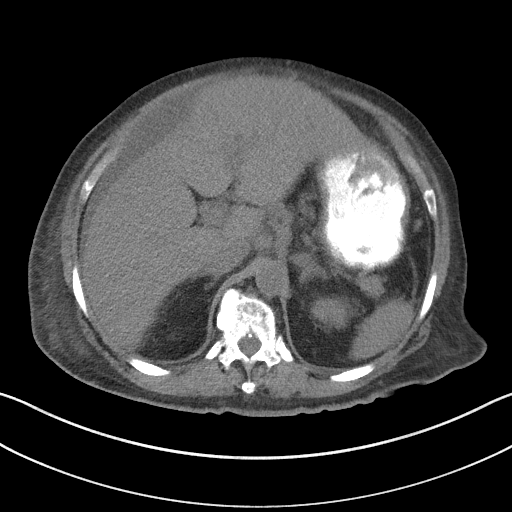
[im 67/100  bone]
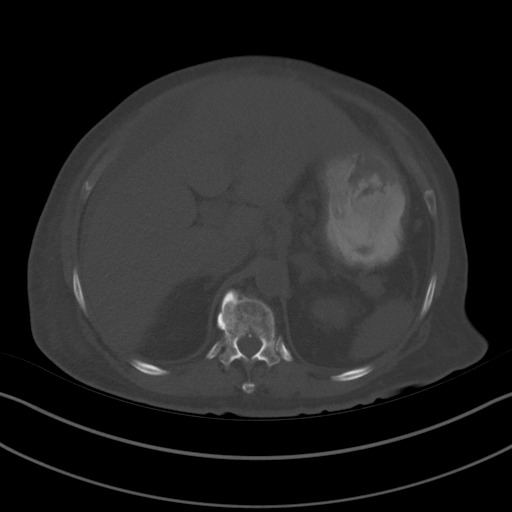
[im 71/100  soft-tissue]
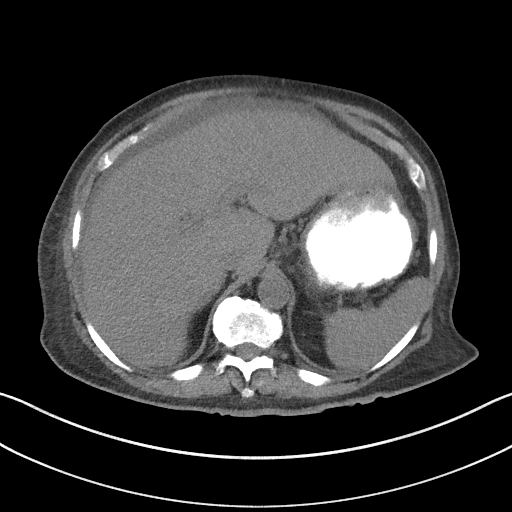
[im 79/100  soft-tissue]
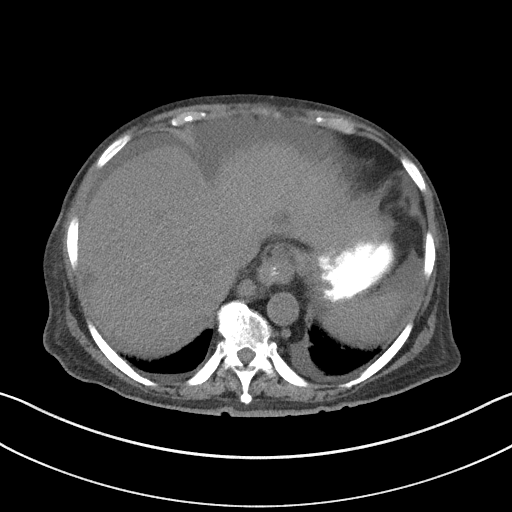
[im 87/100  soft-tissue]
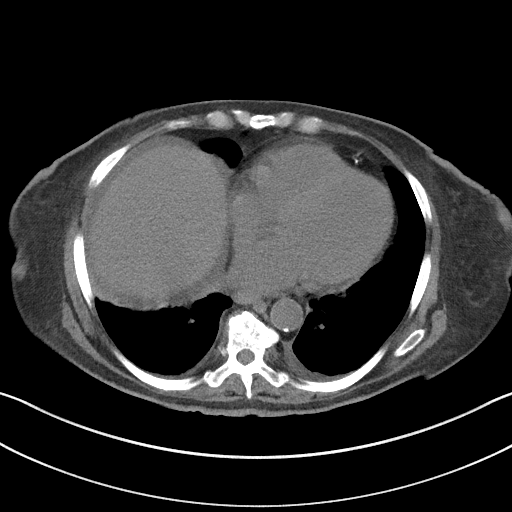
[im 95/100  soft-tissue]
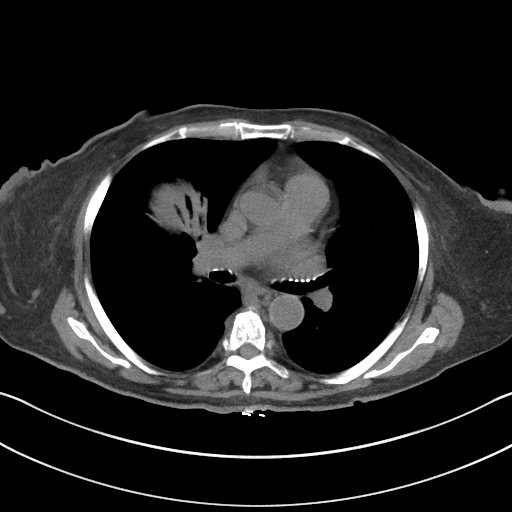

[Series 5: coronal st · coronal · 0.87mm/px · 3 of 93 slices shown]
[im 31/93  soft-tissue]
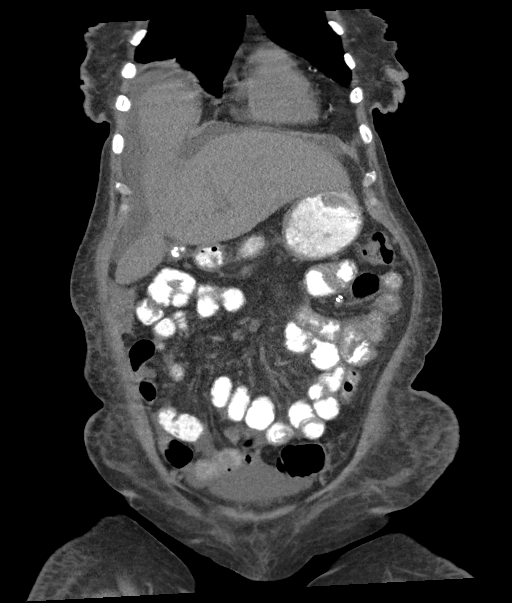
[im 41/93  soft-tissue]
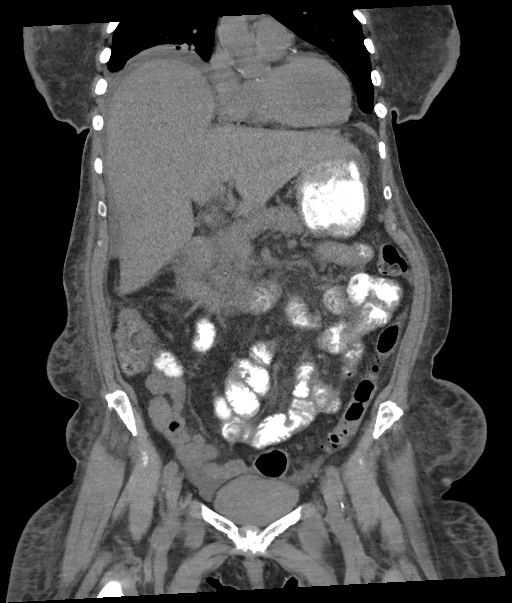
[im 52/93  soft-tissue]
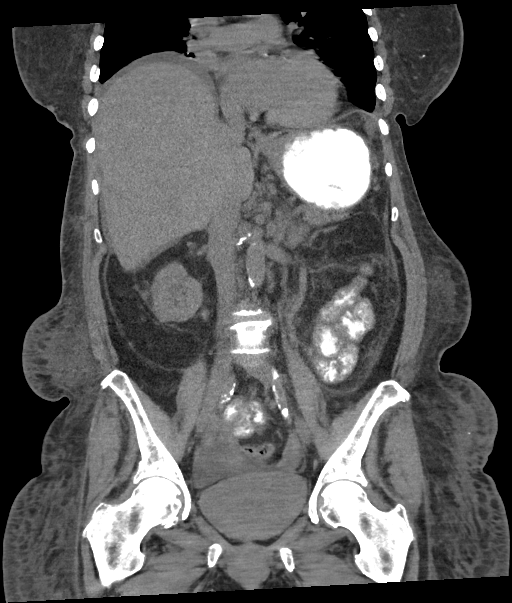

[16 of 46 positions shown; findings below may reference images not displayed]

FINDINGS: Lower chest: Lung bases are clear.

Hepatobiliary: No focal hepatic lesion on noncontrast exam.
Gallstones present in a collapsed gallbladder. Caudate lobe is
prominent. Fluid along the RIGHT hepatic margin. Benign cyst in LEFT
hepatic lobe.

Pancreas: Pancreas is normal. No ductal dilatation. No pancreatic
inflammation.

Spleen: Normal spleen

Adrenals/urinary tract: Adrenal glands normal. No nephrolithiasis
ureterolithiasis. No obstructive uropathy. The density of the urine
in the bladder is greater than typical (HU equal 27). No IV contrast
administered.

Stomach/Bowel: Stomach, small bowel, appendix, and cecum are normal.
The colon and rectosigmoid colon are normal.

Vascular/Lymphatic: Abdominal aorta is normal caliber with
atherosclerotic calcification. There is no retroperitoneal or
periportal lymphadenopathy. No pelvic lymphadenopathy.

Reproductive: Uterus and adnexa unremarkable.

Other: Moderate volume of free fluid along the margin liver and
collecting in the pelvis. Focal of fluid simple fluid attenuation
without organization.

Musculoskeletal: No aggressive osseous lesion.
IMPRESSION: 1. High-density urine within the bladder. Recommend urinalysis and
correlation for cystitis or hematuria.
2. Moderate volume of free fluid in the RIGHT abdomen and pelvis is
favored ascites. Recommend correlation with liver function.
3. Cholelithiasis without evidence cholecystitis.

These results will be called to the ordering clinician or
representative by the Radiologist Assistant, and communication
documented in the PACS or [REDACTED].

## 2020-09-02 MED ORDER — POTASSIUM CHLORIDE ER 10 MEQ PO TBCR: 20.0000 meq | EXTENDED_RELEASE_TABLET | Freq: Two times a day (BID) | ORAL | 1 refills | Status: DC

## 2020-09-02 MED ORDER — FUROSEMIDE 20 MG PO TABS: 40.0000 mg | ORAL_TABLET | Freq: Every day | ORAL | 1 refills | Status: DC

## 2020-09-02 NOTE — Patient Instructions (Addendum)
Medication Instructions:  Your physician has recommended you make the following change in your medication:   1. INCREASE Furosemide 20 mg take 2 tablets (40 mg) once daily 2. INCREASE Potassium 10 mEq take 2 tablets (20 mEq) twice a day  *If you need a refill on your cardiac medications before your next appointment, please call your pharmacy*   Lab Work: None  If you have labs (blood work) drawn today and your tests are completely normal, you will receive your results only by: Marland Kitchen MyChart Message (if you have MyChart) OR . A paper copy in the mail If you have any lab test that is abnormal or we need to change your treatment, we will call you to review the results.   Testing/Procedures: None   Follow-Up: At [facility], you and your health needs are our priority.  As part of our continuing mission to provide you with exceptional heart care, we have created designated Provider Care Teams.  These Care Teams include your primary Cardiologist (physician) and Advanced Practice Providers (APPs -  Physician Assistants and Nurse Practitioners) who all work together to provide you with the care you need, when you need it.   Your next appointment:   1 month(s)  The format for your next appointment:   In Person  Provider:   You may see Dr. [name] End or one of the following Advanced Practice Providers on your designated Care Team:    Murray Hodgkins, NP  Christell Faith, PA-C  Marrianne Mood, PA-C  Cadence Russell, [place]  Laurann Montana, NP

## 2020-09-02 NOTE — Progress Notes (Signed)
Follow-up Outpatient Visit Date: 09/02/2020  Primary Care Provider: Leonel Ramsay, MD [place] [address]  Chief Complaint: Follow-up swelling and cardiac MRI  HPI:  Nancy Rodriguez is a 75 y.o. 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  I last saw her on 08/12/2020, at which time she was experiencing leg swelling after having had a "cheat day" and taking in a large amount of sodium.  We agreed to a short course of furosemide and continued sodium restriction.  She underwent cardiac MRI at [facility] to assess for infiltrative cardiomyopathy in the setting of her recently diagnosed AL amyloidosis.  Today, Nancy Rodriguez reports that she continues to have significant leg swelling, which improved only slightly with addition of furosemide 20 mg daily.  She continues to take this daily.  She notes occasional shortness of breath, not related to certain activity.  She denies orthopnea and PND as well as lightheadedness.  He has experienced occasional isolated palpitations without accompanying symptoms.  Nancy Rodriguez has experienced some intermittent abdominal discomfort and underwent abdominal/pelvic CT ordered by Dr. Tasia Catchings earlier today.  --------------------------------------------------------------------------------------------------  Cardiovascular History & Procedures: Cardiovascular Problems:  Question cardiac amyloidosis and abnormal EKG  Risk Factors:  Hypertension, hyperlipidemia, diabetes mellitus, and age greater than 39  Cath/PCI:  None  CV Surgery:  None  EP Procedures and Devices:  None  Non-Invasive Evaluation(s):  Cardiac MRI (09/01/2020, [facility]): Normal LV size with mild to moderate basal sigmoid septal hypertrophy (up to 1.6 mm) with hyperdynamic left ventricular function  (LVEF 71%).  No abnormal delayed hyperenhancement to suggest scarring or infiltration.  Normal RV size and function.  Mild biatrial enlargement.  Trileaflet aortic valve without stenosis or regurgitation.  Trivial mitral and mild tricuspid regurgitation.  Normal pericardium.  Small bilateral pleural effusions.  TTE (06/01/2020): Normal LV size with mild asymmetric hypertrophy of the basal septum. LVEF 60-65% with GLS -16.8%. Diastolic parameters indeterminate. Normal RV size and function. Mild mitral regurgitation. No obvious findings of cardiac amyloidosis.  Exercise stress echocardiogram (04/01/2012, [facility]): Normal study.   Recent CV Pertinent Labs: Lab Results  Component Value Date   INR 1.1 05/20/2020   K 3.6 08/31/2020   BUN 21 08/31/2020   CREATININE 1.19 (H) 08/31/2020    Past medical and surgical history were reviewed and updated in EPIC.  Current Meds  Medication Sig  . acyclovir (ZOVIRAX) 400 MG tablet Take 1 tablet (400 mg total) by mouth 2 (two) times daily.  Marland Kitchen albuterol (VENTOLIN HFA) 108 (90 Base) MCG/ACT inhaler Inhale 2 puffs into the lungs every 4 (four) hours as needed.  Marland Kitchen amLODipine (NORVASC) 5 MG tablet Take 5 mg by mouth daily.  . cyclophosphamide (CYTOXAN) 50 MG capsule TAKE 10 CAPSULES (500 MG TOTAL) BY MOUTH ONCE A WEEK. TAKE WITH BREAKFAST.  Marland Kitchen docusate sodium (COLACE) 100 MG capsule Take 1 capsule by mouth as needed.  Marland Kitchen EPINEPHrine 0.3 mg/0.3 mL IJ SOAJ injection   . hydrOXYzine (ATARAX/VISTARIL) 10 MG tablet Take 10 mg by mouth 2 (two) times daily as needed.  . insulin glargine (LANTUS SOLOSTAR) 100 UNIT/ML Solostar Pen Inject 12 Units into the skin 2 (two) times daily. Pt takes in morning and bedtime.  Marland Kitchen LORazepam (ATIVAN) 0.5 MG tablet Take 1 tablet (0.5 mg total) by mouth every 6 (six) hours as needed (nausea vomiting).  . metoprolol succinate (  TOPROL-XL) 50 MG 24 hr tablet TAKE 1 TABLET (50 MG TOTAL) BY MOUTH DAILY IN THE MORNING  . mirtazapine (REMERON)  7.5 MG tablet Take 1 tablet (7.5 mg total) by mouth at bedtime.  Marland Kitchen omeprazole (PRILOSEC) 20 MG capsule Take 1 capsule (20 mg total) by mouth daily.  . ondansetron (ZOFRAN) 8 MG tablet Take 8 mg by mouth 30 to 60 min prior to Cytoxan administration then take 8 mg twice daily as needed for nausea and vomiting.  . promethazine (PHENERGAN) 25 MG tablet Take 1 tablet (25 mg total) by mouth every 8 (eight) hours as needed for nausea or vomiting.  . traMADol (ULTRAM) 50 MG tablet Take 1 tablet (50 mg total) by mouth every 6 (six) hours as needed for severe pain.  . [DISCONTINUED] furosemide (LASIX) 20 MG tablet Take 1 tablet ($RemoveB'20mg'swvmgEyY$ ) daily for 3 days or until swelling improves  . [DISCONTINUED] potassium chloride (KLOR-CON) 10 MEQ tablet Take 1 tablet (10 mEq total) by mouth daily. (Patient taking differently: Take 20 mEq by mouth daily.)    Allergies: Benazepril, Lisinopril, Tolmetin, and Nsaids  Social History   Tobacco Use  . Smoking status: Never Smoker  . Smokeless tobacco: Never Used  Vaping Use  . Vaping Use: Never used  Substance Use Topics  . Alcohol use: No  . Drug use: Never    Family History  Problem Relation Age of Onset  . Diabetes Daughter   . Diabetes Son   . Dementia Mother   . Cancer Father   . Esophageal cancer Sister   . Brain cancer Brother     Review of Systems: A 12-system review of systems was performed and was negative except as noted in the HPI.  --------------------------------------------------------------------------------------------------  Physical Exam: BP 130/62 (BP Location: Right Arm, Patient Position: Sitting, Cuff Size: Normal)   Pulse 82   Ht $R'5\' 3"'It$  (1.6 m)   Wt 150 lb (68 kg)   SpO2 96%   BMI 26.57 kg/m   General:  NAD.  Accompanied by her daughter.  Her son joins [place] by phone. Neck: No JVD or HJR. Lungs: Mildly diminished breath sounds at the lung bases.  No wheezes or crackles. Heart: Regular rate and rhythm without murmurs, rubs, or  gallops. Abdomen: Soft, nontender, nondistended. Extremities: 2+ pitting edema to the proximal calves bilaterally.  Lab Results  Component Value Date   WBC 7.6 08/31/2020   HGB 8.4 (L) 08/31/2020   HCT 25.0 (L) 08/31/2020   MCV 74.6 (L) 08/31/2020   PLT 315 08/31/2020    Lab Results  Component Value Date   NA 134 (L) 08/31/2020   K 3.6 08/31/2020   CL 101 08/31/2020   CO2 25 08/31/2020   BUN 21 08/31/2020   CREATININE 1.19 (H) 08/31/2020   GLUCOSE 276 (H) 08/31/2020   ALT 41 08/31/2020    No results found for: CHOL, HDL, LDLCALC, LDLDIRECT, TRIG, CHOLHDL  --------------------------------------------------------------------------------------------------  ASSESSMENT AND PLAN: Anasarca: Nancy Rodriguez still has significant leg edema and was also noted to have ascites and pleural fluid on recent cross-sectional imaging.  This is most likely multifactorial but does not appear to be driven primarily by heart failure.  I suspect that "cheat day" a few weeks ago with significant sodium load may have precipitated the episode and we have not adequately diuresed her to achieve a euvolemic state.  Diminished oncotic pressure in the setting of hypoalbuminemia and anemia secondary to her myeloma and ongoing chemotherapy are likely contributing.  We have  agreed to increase furosemide to 40 mg daily and potassium supplementation to 20 mEq twice daily.  Nancy Rodriguez has standing weekly labs on Wednesdays through Dr. Tasia Catchings.  We will therefore defer rechecking labs today.  I encouraged Nancy Rodriguez to target a 2 L fluid intake and to continue sodium restriction.  Multiple myeloma: Cardiac MRI performed at [facility] yesterday showed no evidence of infiltrative cardiomyopathy with preserved LVEF and mild to moderate basal septal hypertrophy.  Hypertension: Blood pressure upper normal today.  We will escalate furosemide, as above.  If leg swelling remains refractory to furosemide, discontinuation of amlodipine  will also need to be considered.  Continue current dose of metoprolol.  Follow-up: Return to clinic in 1 month.  Nelva Bush, MD 09/04/2020 6:06 PM

## 2020-09-02 NOTE — Progress Notes (Signed)
Called pharmacy to correct prescription for the Furosemide due to error. Pharmacy read back medication, dose, and instructions.

## 2020-09-04 ENCOUNTER — Encounter: Payer: Self-pay | Admitting: Internal Medicine

## 2020-09-04 DIAGNOSIS — R601 Generalized edema: Secondary | ICD-10-CM | POA: Insufficient documentation

## 2020-09-07 ENCOUNTER — Other Ambulatory Visit: Payer: Self-pay

## 2020-09-07 ENCOUNTER — Inpatient Hospital Stay: Payer: Medicare Other

## 2020-09-07 ENCOUNTER — Inpatient Hospital Stay (HOSPITAL_BASED_OUTPATIENT_CLINIC_OR_DEPARTMENT_OTHER): Payer: Medicare Other | Admitting: Oncology

## 2020-09-07 ENCOUNTER — Encounter: Payer: Self-pay | Admitting: Oncology

## 2020-09-07 VITALS — BP 114/63 | HR 88 | Temp 98.3°F | Resp 16 | Wt 142.1 lb

## 2020-09-07 VITALS — BP 114/65 | HR 79 | Resp 16

## 2020-09-07 DIAGNOSIS — E859 Amyloidosis, unspecified: Secondary | ICD-10-CM

## 2020-09-07 DIAGNOSIS — R7401 Elevation of levels of liver transaminase levels: Secondary | ICD-10-CM

## 2020-09-07 DIAGNOSIS — Z5111 Encounter for antineoplastic chemotherapy: Secondary | ICD-10-CM | POA: Diagnosis not present

## 2020-09-07 DIAGNOSIS — E8581 Light chain (AL) amyloidosis: Secondary | ICD-10-CM | POA: Diagnosis not present

## 2020-09-07 DIAGNOSIS — R112 Nausea with vomiting, unspecified: Secondary | ICD-10-CM

## 2020-09-07 DIAGNOSIS — N1831 Chronic kidney disease, stage 3a: Secondary | ICD-10-CM

## 2020-09-07 DIAGNOSIS — D631 Anemia in chronic kidney disease: Secondary | ICD-10-CM

## 2020-09-07 DIAGNOSIS — R634 Abnormal weight loss: Secondary | ICD-10-CM

## 2020-09-07 DIAGNOSIS — T451X5A Adverse effect of antineoplastic and immunosuppressive drugs, initial encounter: Secondary | ICD-10-CM

## 2020-09-07 LAB — CBC WITH DIFFERENTIAL/PLATELET
Abs Immature Granulocytes: 0.05 10*3/uL (ref 0.00–0.07)
Basophils Absolute: 0 10*3/uL (ref 0.0–0.1)
Basophils Relative: 0 %
Eosinophils Absolute: 0.2 10*3/uL (ref 0.0–0.5)
Eosinophils Relative: 3 %
HCT: 27.1 % — ABNORMAL LOW (ref 36.0–46.0)
Hemoglobin: 9.2 g/dL — ABNORMAL LOW (ref 12.0–15.0)
Immature Granulocytes: 1 %
Lymphocytes Relative: 19 %
Lymphs Abs: 1.3 10*3/uL (ref 0.7–4.0)
MCH: 25.5 pg — ABNORMAL LOW (ref 26.0–34.0)
MCHC: 33.9 g/dL (ref 30.0–36.0)
MCV: 75.1 fL — ABNORMAL LOW (ref 80.0–100.0)
Monocytes Absolute: 0.5 10*3/uL (ref 0.1–1.0)
Monocytes Relative: 7 %
Neutro Abs: 5 10*3/uL (ref 1.7–7.7)
Neutrophils Relative %: 70 %
Platelets: 346 10*3/uL (ref 150–400)
RBC: 3.61 MIL/uL — ABNORMAL LOW (ref 3.87–5.11)
RDW: 26.5 % — ABNORMAL HIGH (ref 11.5–15.5)
Smear Review: NORMAL
WBC: 7.1 10*3/uL (ref 4.0–10.5)
nRBC: 0.4 % — ABNORMAL HIGH (ref 0.0–0.2)

## 2020-09-07 LAB — COMPREHENSIVE METABOLIC PANEL
ALT: 43 U/L (ref 0–44)
AST: 65 U/L — ABNORMAL HIGH (ref 15–41)
Albumin: 2.1 g/dL — ABNORMAL LOW (ref 3.5–5.0)
Alkaline Phosphatase: 1204 U/L — ABNORMAL HIGH (ref 38–126)
Anion gap: 12 (ref 5–15)
BUN: 19 mg/dL (ref 8–23)
CO2: 26 mmol/L (ref 22–32)
Calcium: 8.5 mg/dL — ABNORMAL LOW (ref 8.9–10.3)
Chloride: 96 mmol/L — ABNORMAL LOW (ref 98–111)
Creatinine, Ser: 1.23 mg/dL — ABNORMAL HIGH (ref 0.44–1.00)
GFR, Estimated: 46 mL/min — ABNORMAL LOW (ref >60)
Glucose, Bld: 243 mg/dL — ABNORMAL HIGH (ref 70–99)
Potassium: 3.7 mmol/L (ref 3.5–5.1)
Sodium: 134 mmol/L — ABNORMAL LOW (ref 135–145)
Total Bilirubin: 3.7 mg/dL — ABNORMAL HIGH (ref 0.3–1.2)
Total Protein: 5.5 g/dL — ABNORMAL LOW (ref 6.5–8.1)

## 2020-09-07 MED ORDER — SODIUM CHLORIDE 0.9 % IV SOLN
150.0000 mg | Freq: Once | INTRAVENOUS | Status: AC
  Administered 2020-09-07: 150 mg via INTRAVENOUS
  Filled 2020-09-07: qty 150

## 2020-09-07 MED ORDER — BORTEZOMIB CHEMO SQ INJECTION 3.5 MG (2.5MG/ML)
0.7000 mg/m2 | Freq: Once | INTRAMUSCULAR | Status: AC
Start: 2020-09-07 — End: 2020-09-07
  Administered 2020-09-07: 1.25 mg via SUBCUTANEOUS
  Filled 2020-09-07: qty 0.5

## 2020-09-07 MED ORDER — DEXAMETHASONE 4 MG PO TABS
20.0000 mg | ORAL_TABLET | Freq: Once | ORAL | Status: AC
  Administered 2020-09-07: 20 mg via ORAL
  Filled 2020-09-07: qty 5

## 2020-09-07 MED ORDER — DIPHENHYDRAMINE HCL 25 MG PO CAPS
50.0000 mg | ORAL_CAPSULE | Freq: Once | ORAL | Status: AC
  Administered 2020-09-07: 50 mg via ORAL
  Filled 2020-09-07: qty 2

## 2020-09-07 MED ORDER — SODIUM CHLORIDE 0.9 % IV SOLN
Freq: Once | INTRAVENOUS | Status: AC
  Filled 2020-09-07: qty 250

## 2020-09-07 MED ORDER — ACETAMINOPHEN 325 MG PO TABS
325.0000 mg | ORAL_TABLET | Freq: Once | ORAL | Status: AC
Start: 2020-09-07 — End: 2020-09-07
  Administered 2020-09-07: 325 mg via ORAL
  Filled 2020-09-07: qty 1

## 2020-09-07 MED ORDER — PALONOSETRON HCL INJECTION 0.25 MG/5ML
0.2500 mg | Freq: Once | INTRAVENOUS | Status: AC
  Administered 2020-09-07: 0.25 mg via INTRAVENOUS
  Filled 2020-09-07: qty 5

## 2020-09-07 MED ORDER — DARATUMUMAB-HYALURONIDASE-FIHJ 1800-30000 MG-UT/15ML ~~LOC~~ SOLN
1800.0000 mg | Freq: Once | SUBCUTANEOUS | Status: AC
  Administered 2020-09-07: 1800 mg via SUBCUTANEOUS
  Filled 2020-09-07: qty 15

## 2020-09-07 NOTE — Progress Notes (Signed)
Patient has a decrease in appetite and has lost 8 lbs since last documented wt.

## 2020-09-07 NOTE — Progress Notes (Signed)
Hematology/Oncology  Follow up note [facility] [address] [phone] [phone]   Patient Care Team: Leonel Ramsay, MD as PCP - General (Infectious Diseases)  REFERRING PROVIDER: Leonel Ramsay, MD  CHIEF COMPLAINTS/REASON FOR VISIT:  Follow-up for amyloidosis  HISTORY OF PRESENTING ILLNESS:   Nancy Rodriguez is a  75 y.o.  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  Work up include protein electrophoresis showed M protein 0.7,   Reviewed her previous medical records via care everywhere.  She was seen by Hematology Oncology at [facility] on 02/14/2017.  07/25/2007  SPEP showed M protein of 0.35, IFE showed IgG lamda 09/22/2007  Bone survey negative.  02/15/14 IgG 930, SPEP M protien 0.22, free kappa light chain 3.59, lamda 2.92, ratio 1.23  Hypercalcemia, resolved after stopping HCTZ.  # Transaminitis 03/28/20 US abdomen showed left liver lobe Complex 1.7 cm cyst  # 04/07/20 MRI abdomen w/wo contrast showed 2 benign liver cysts, 2 nonspecific hypovascular irighr liver lesions, abnormal bone marrow signal of L1 vertebral body. Patient was  advised to proceed with bone marrow biopsy and she declined.  # referred her to established care with GI and was seen on 04/19/20,  # Liver biopsy showed amyloidosis. [place] MS/MS showed AL type # 05/20/20 recommend bone marrow biopsy which is scheduled on 05/24/2020.  Patient changed her mind and ask a biopsy to be canceled.  My team and I had multiple phone discussion with patient's daughter [name] and later with [name].  Patient agreed with bone marrow biopsy and a biopsy was scheduled on 06/09/2020.  05/20/20 NT proBNP  was normal,  TSH slightly increased, normal T4 Normal factor X Normal coags Troponin 19. Refer to  cardiology for evaluation. May need cardiac MRI  # 06/01/2020 2D echo result was reviewed and discussed with patient.  Patient has normal LVEF 60-65%. Mild asymmetric left ventricular hypertrophy.  Left ventricular diastolic parameters are indeterminate. average left ventricular global longitudinal strain is -16.8 %.  # 06/09/2020, bone marrow biopsy showed monoclonal plasmacytosis, 8%, amyloid deposit present.  Absent iron stores. 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 t (14;20) #06/17/2020, further discussed about diagnosis and treatment plan.  Patient declined bone marrow transplant evaluation.  Decision was made to proceed with Dara-CyborD chemotherapy treatments. #May 2022, second opinion at [facility] with Dr. Tracey Harries.who agrees with current treatment plan.  He recommends to lower dexamethasone to 20 mg and decrease premed Tylenol to 325 mg  INTERVAL HISTORY Nancy Rodriguez is a 75 y.o. female who has above history reviewed by me today presents for follow up visit for management of AL amyloidosis Problems and complaints are listed below: Patient presents for the evaluation prior to Dara-CyborD treatment. Nausea has improved with antiemetics.  Weight loss, lost 8 pounds since last visit She feels that her bloating and abdomen distension have improved.  09/01/2020 cardiac MRI was performed at [facility].  Left ventricle is normal in size.  Mild to moderate basal septal hypertrophy.  Hyper dynamic LV function.  LVEF 71%.  Right ventricle is normal in size and wall thickness.  Systolic function normal.  Mild bi atrial enlargement, no significant aortic valve stenosis or regurgitation.  Trivial mitral regurgitation, mild tricuspid regurgitation and a trivial pulmonic regurgitation.  No  evidence of MI, scarring or infiltration.   Review of Systems  Constitutional: Positive for unexpected weight change. Negative for appetite change, chills, fatigue and fever.   HENT:   Negative for hearing loss, nosebleeds and voice change.   Eyes: Negative for eye problems.  Respiratory: Negative for chest tightness and cough.   Cardiovascular: Negative for chest pain and leg swelling.  Gastrointestinal: Negative for abdominal distention, abdominal pain, blood in stool and nausea.  Endocrine: Negative for hot flashes.  Genitourinary: Negative for difficulty urinating and frequency.   Musculoskeletal: Negative for arthralgias.  Skin: Positive for itching. Negative for rash.  Neurological: Negative for extremity weakness and numbness.  Hematological: Negative for adenopathy.  Psychiatric/Behavioral: Negative for confusion.    MEDICAL HISTORY:  Past Medical History:  Diagnosis Date  . Anemia    Anemia in chronic kidney disease  . Chronic kidney disease    Stage 3b chronic kidney disease  . Diabetes mellitus without complication ([place])   . Hypercholesterolemia   . Hypertension   . MGUS (monoclonal gammopathy of unknown significance)   . Osteoarthritis   . Rheumatoid arthritis ([place])     SURGICAL HISTORY: Past Surgical History:  Procedure Laterality Date  . HERNIA REPAIR    . PARATHYROIDECTOMY      SOCIAL HISTORY: Social History   Socioeconomic History  . Marital status: Widowed    Spouse name: Not on file  . Number of children: 6  . Years of education: Not on file  . Highest education level: Not on file  Occupational History  . Not on file  Tobacco Use  . Smoking status: Never Smoker  . Smokeless tobacco: Never Used  Vaping Use  . Vaping Use: Never used  Substance and Sexual Activity  . Alcohol use: No  . Drug use: Never  . Sexual activity: Not on file  Other Topics Concern  . Not on file  Social History Narrative  . Not on file   Social Determinants of Health   Financial Resource Strain: Not on file  Food Insecurity: Not on file  Transportation Needs: Not on file  Physical Activity: Not on file  Stress: Not on file  Social  Connections: Not on file  Intimate Partner Violence: Not on file    FAMILY HISTORY: Family History  Problem Relation Age of Onset  . Diabetes Daughter   . Diabetes Son   . Dementia Mother   . Cancer Father   . Esophageal cancer Sister   . Brain cancer Brother     ALLERGIES:  is allergic to benazepril, lisinopril, tolmetin, and nsaids.  MEDICATIONS:  Current Outpatient Medications  Medication Sig Dispense Refill  . acyclovir (ZOVIRAX) 400 MG tablet Take 1 tablet (400 mg total) by mouth 2 (two) times daily. 60 tablet 5  . albuterol (VENTOLIN HFA) 108 (90 Base) MCG/ACT inhaler Inhale 2 puffs into the lungs every 4 (four) hours as needed.    Marland Kitchen amLODipine (NORVASC) 5 MG tablet Take 5 mg by mouth daily.    . cyclophosphamide (CYTOXAN) 50 MG capsule TAKE 10 CAPSULES (500 MG TOTAL) BY MOUTH ONCE A WEEK. TAKE WITH BREAKFAST. 40 capsule 3  . docusate sodium (COLACE) 100 MG capsule Take 1 capsule by mouth as needed.    Marland Kitchen EPINEPHrine 0.3 mg/0.3 mL IJ SOAJ injection     . ferrous sulfate 325 (65 FE) MG tablet Take 1 tablet by mouth daily.    . furosemide (LASIX) 20 MG tablet Take 2 tablets (40 mg total) by  mouth daily. 60 tablet 1  . hydrOXYzine (ATARAX/VISTARIL) 10 MG tablet Take 10 mg by mouth 2 (two) times daily as needed.    . insulin glargine (LANTUS SOLOSTAR) 100 UNIT/ML Solostar Pen Inject 12 Units into the skin 2 (two) times daily. Pt takes in morning and bedtime.    Marland Kitchen LORazepam (ATIVAN) 0.5 MG tablet Take 1 tablet (0.5 mg total) by mouth every 6 (six) hours as needed (nausea vomiting). 30 tablet 0  . metoprolol succinate (TOPROL-XL) 50 MG 24 hr tablet TAKE 1 TABLET (50 MG TOTAL) BY MOUTH DAILY IN THE MORNING    . mirtazapine (REMERON) 7.5 MG tablet Take 1 tablet (7.5 mg total) by mouth at bedtime. 30 tablet 2  . omeprazole (PRILOSEC) 20 MG capsule Take 1 capsule (20 mg total) by mouth daily. 30 capsule 1  . ondansetron (ZOFRAN) 8 MG tablet Take 8 mg by mouth 30 to 60 min prior to  Cytoxan administration then take 8 mg twice daily as needed for nausea and vomiting. 30 tablet 1  . potassium chloride (KLOR-CON) 10 MEQ tablet Take 2 tablets (20 mEq total) by mouth 2 (two) times daily. 120 tablet 1  . promethazine (PHENERGAN) 25 MG tablet Take 1 tablet (25 mg total) by mouth every 8 (eight) hours as needed for nausea or vomiting. 30 tablet 0  . traMADol (ULTRAM) 50 MG tablet Take 1 tablet (50 mg total) by mouth every 6 (six) hours as needed for severe pain. 8 tablet 0   No current facility-administered medications for this visit.   Facility-Administered Medications Ordered in Other Visits  Medication Dose Route Frequency Provider Last Rate Last Admin  . acetaminophen (TYLENOL) tablet 325 mg  325 mg Oral Once [name], MD      . bortezomib SQ (VELCADE) chemo injection (2.5mg /mL concentration) 1.25 mg  0.7 mg/m2 (Treatment Plan Recorded) Subcutaneous Once [name], MD      . daratumumab-hyaluronidase-fihj [facility]) 1800-30000 MG-UT/15ML chemo SQ injection 1,800 mg  1,800 mg Subcutaneous Once [name], MD      . dexamethasone (DECADRON) tablet 20 mg  20 mg Oral Once [name], MD      . diphenhydrAMINE (BENADRYL) capsule 50 mg  50 mg Oral Once [name], MD      . fosaprepitant (EMEND) 150 mg in sodium chloride 0.9 % 145 mL IVPB  150 mg Intravenous Once [name], MD      . palonosetron (ALOXI) injection 0.25 mg  0.25 mg Intravenous Once [name], MD         PHYSICAL EXAMINATION: ECOG PERFORMANCE STATUS: 1 - Symptomatic but completely ambulatory Vitals:   09/07/20 0857  BP: 114/63  Pulse: 88  Resp: 16  Temp: 98.3 F (36.8 C)   Filed Weights   09/07/20 0857  Weight: 142 lb 1.6 oz (64.5 kg)    Physical Exam Constitutional:      General: She is not in acute distress. HENT:     Head: Normocephalic and atraumatic.  Eyes:     General: No scleral icterus. Cardiovascular:     Rate and Rhythm: Normal rate and regular rhythm.     Heart sounds: Murmur heard.     Pulmonary:     Effort: Pulmonary effort is normal. No respiratory distress.     Breath sounds: No wheezing.  Abdominal:     General: Bowel sounds are normal. There is no distension.     Palpations: Abdomen is soft.  Musculoskeletal:  General: Swelling present. No deformity. Normal range of motion.     Cervical back: Normal range of motion and neck supple.     Comments: Bilateral lower extremity 1+ edema  Skin:    General: Skin is warm and dry.     Findings: No erythema or rash.  Neurological:     Mental Status: She is alert and oriented to person, place, and time. Mental status is at baseline.     Cranial Nerves: No cranial nerve deficit.     Coordination: Coordination normal.  Psychiatric:        Mood and Affect: Mood normal.     LABORATORY DATA:  I have reviewed the data as listed Lab Results  Component Value Date   WBC 7.1 09/07/2020   HGB 9.2 (L) 09/07/2020   HCT 27.1 (L) 09/07/2020   MCV 75.1 (L) 09/07/2020   PLT 346 09/07/2020   Recent Labs    08/24/20 0843 08/31/20 1037 09/07/20 0822  NA 133* 134* 134*  K 3.8 3.6 3.7  CL 99 101 96*  CO2 $Re'25 25 26  'ZJt$ GLUCOSE 295* 276* 243*  BUN $Re'18 21 19  'sfW$ CREATININE 1.10* 1.19* 1.23*  CALCIUM 8.5* 8.6* 8.5*  GFRNONAA 52* 47* 46*  PROT 5.2* 5.6* 5.5*  ALBUMIN 2.0* 2.2* 2.1*  AST 74* 60* 65*  ALT 51* 41 43  ALKPHOS 1,298* 1,221* 1,204*  BILITOT 3.3* 3.7* 3.7*   Iron/TIBC/Ferritin/ %Sat    Component Value Date/Time   IRON 35 08/31/2020 1037   TIBC 290 08/31/2020 1037   FERRITIN 82 08/31/2020 1037   IRONPCTSAT 12 08/31/2020 1037     08/25/2019, platelet count 491, WBC 7.5, hemoglobin 12 Creatinine 1.58, EGFR 37, calcium 10.8, albumin 4.2 Negative hepatitis B surface antigen, hepatitis B core antibody, Negative hepatitis C 08/05/2019, A1c 11.2   RADIOGRAPHIC STUDIES: I have personally reviewed the radiological images as listed and agreed with the findings in the report. CT ABDOMEN PELVIS WO  CONTRAST  Result Date: 09/02/2020 CLINICAL DATA:  Light chain amyloidosis. Weight loss. Abdominal distension. EXAM: CT ABDOMEN AND PELVIS WITHOUT CONTRAST TECHNIQUE: Multidetector CT imaging of the abdomen and pelvis was performed following the standard protocol without IV contrast. COMPARISON:  MRI abdomen 04/07/2020 FINDINGS: Lower chest: Lung bases are clear. Hepatobiliary: No focal hepatic lesion on noncontrast exam. Gallstones present in a collapsed gallbladder. Caudate lobe is prominent. Fluid along the RIGHT hepatic margin. Benign cyst in LEFT hepatic lobe. Pancreas: Pancreas is normal. No ductal dilatation. No pancreatic inflammation. Spleen: Normal spleen Adrenals/urinary tract: Adrenal glands normal. No nephrolithiasis ureterolithiasis. No obstructive uropathy. The density of the urine in the bladder is greater than typical (HU equal 27). No IV contrast administered. Stomach/Bowel: Stomach, small bowel, appendix, and cecum are normal. The colon and rectosigmoid colon are normal. Vascular/Lymphatic: Abdominal aorta is normal caliber with atherosclerotic calcification. There is no retroperitoneal or periportal lymphadenopathy. No pelvic lymphadenopathy. Reproductive: Uterus and adnexa unremarkable. Other: Moderate volume of free fluid along the margin liver and collecting in the pelvis. Focal of fluid simple fluid attenuation without organization. Musculoskeletal: No aggressive osseous lesion. IMPRESSION: 1. High-density urine within the bladder. Recommend urinalysis and correlation for cystitis or hematuria. 2. Moderate volume of free fluid in the RIGHT abdomen and pelvis is favored ascites. Recommend correlation with liver function. 3. Cholelithiasis without evidence cholecystitis. These results will be called to the ordering clinician or representative by the Radiologist Assistant, and communication documented in the PACS or [employer]. Electronically Signed   By: Nicole Kindred  Leonia Reeves M.D.   On:  09/02/2020 09:25   US Abdomen Limited  Result Date: 08/25/2020 CLINICAL DATA:  Ascites. EXAM: LIMITED ABDOMEN ULTRASOUND FOR ASCITES TECHNIQUE: Limited ultrasound survey for ascites was performed in all four abdominal quadrants. COMPARISON:  None. FINDINGS: 4 quadrant assessment of the abdomen/pelvis demonstrates no intraperitoneal free fluid. Small bilateral pleural effusions noted incidentally. IMPRESSION: No sonographic evidence of ascites. Electronically Signed   By: Misty Stanley M.D.   On: 08/25/2020 09:32   US Venous Img Lower Bilateral  Result Date: 07/27/2020 CLINICAL DATA:  Bilateral lower extremity swelling EXAM: BILATERAL LOWER EXTREMITY VENOUS DOPPLER ULTRASOUND TECHNIQUE: Gray-scale sonography with graded compression, as well as color Doppler and duplex ultrasound were performed to evaluate the lower extremity deep venous systems from the level of the common femoral vein and including the common femoral, femoral, profunda femoral, popliteal and calf veins including the posterior tibial, peroneal and gastrocnemius veins when visible. The superficial great saphenous vein was also interrogated. Spectral Doppler was utilized to evaluate flow at rest and with distal augmentation maneuvers in the common femoral, femoral and popliteal veins. COMPARISON:  None. FINDINGS: RIGHT LOWER EXTREMITY Common Femoral Vein: No evidence of thrombus. Normal compressibility, respiratory phasicity and response to augmentation. Saphenofemoral Junction: No evidence of thrombus. Normal compressibility and flow on color Doppler imaging. Profunda Femoral Vein: No evidence of thrombus. Normal compressibility and flow on color Doppler imaging. Femoral Vein: No evidence of thrombus. Normal compressibility, respiratory phasicity and response to augmentation. Popliteal Vein: No evidence of thrombus. Normal compressibility, respiratory phasicity and response to augmentation. Calf Veins: No evidence of thrombus. Normal  compressibility and flow on color Doppler imaging. Other Findings:  Lower extremity subcutaneous edema noted LEFT LOWER EXTREMITY Common Femoral Vein: No evidence of thrombus. Normal compressibility, respiratory phasicity and response to augmentation. Saphenofemoral Junction: No evidence of thrombus. Normal compressibility and flow on color Doppler imaging. Profunda Femoral Vein: No evidence of thrombus. Normal compressibility and flow on color Doppler imaging. Femoral Vein: No evidence of thrombus. Normal compressibility, respiratory phasicity and response to augmentation. Popliteal Vein: No evidence of thrombus. Normal compressibility, respiratory phasicity and response to augmentation. Calf Veins: No evidence of thrombus. Normal compressibility and flow on color Doppler imaging. Other Findings:  Lower extremity subcutaneous edema noted IMPRESSION: No evidence of deep venous thrombosis in either lower extremity. Electronically Signed   By: Jerilynn Mages.  Shick M.D.   On: 07/27/2020 14:28      ASSESSMENT & PLAN:  1. Light chain (AL) amyloidosis (HCC)   2. Encounter for antineoplastic chemotherapy   3. 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[place])   4. Chemotherapy induced nausea and vomiting   5. Loss of weight   6. Transaminitis    # AL-amyloidosis Diagnosis of AL amyloidosis was discussed with patient.  Patient has biopsy confirmed liver involvement, possible kidney involvement- Labs are reviewed Proceed with cycle 3  Day 15 CyBorD- Hyperbilirubinemia and abnormal liver function. Bilirubin 3.7 recommend patient to take 350 mg cyclophosphamide today.Gwendel Hanson  Has been dose reduced to 0.7 mg/m, if liver function is further impaired, consider to decrease to 0.$RemoveBefor'5mg'AgzgttQdymhG$ /m2   #Abdominal bloating.  Improved. CT image was reviewed and discussed.  Ascties, moderate amount on CT. She feels better today, not bloated. Hold of paracentesis. Will proceed if she needs. #Constipation, advised patient to take Colace 100  mg daily.  If needed, can go up to 100 mg twice daily.  #Chemotherapy-induced nausea, likely secondary to the oral cyclophosphamide.  Continue Emend and  Aloxi as premed.  Continue home antiemetic regimen-Zofran, Ativan as instructed. Phenergan 25 mg every 8 hours as needed.    #Hypokalemia, potassium level 3.7.  Continue potassium chloride 10 mEq daily.  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  Stable creatinine.  24-hour urine protein has improved. Follow up with nephrology  #Microcytic anemia likely due to iron deficiency, continue oral iron supplementation. #Anxiety, recommend patient to take Remeron. #History of major depression listed in outside problem list.  previous diagnosis and treatment details are not available to me in EMR  I have referred her to psychiatrist  #Bilateral lower extremity swelling, neg DVT.  Cardiology prescribed Lasix 20 mg daily, continue compression stocking.  Plan was discussed with patient and daughter.  Also discussed her son [name] over the phone. Supportive care measures are necessary for patient well-being and will be provided as necessary. We spent sufficient time to discuss many aspect of care, questions were answered to patient's satisfaction.  Follow-up in 1 week. cc Leonel Ramsay, MD   [name], MD, PhD Hematology Oncology [facility] Pager- [phone] 09/07/2020

## 2020-09-07 NOTE — Patient Instructions (Signed)
[facility]  Discharge Instructions: Thank you for choosing [facility] to provide your oncology and hematology care.  If you have a lab appointment with the Cancer Center, please go directly to the Cancer Center and check in at the registration area.  Wear comfortable clothing and clothing appropriate for easy access to any Portacath or PICC line.   We strive to give you quality time with your provider. You may need to reschedule your appointment if you arrive late (15 or more minutes).  Arriving late affects you and other patients whose appointments are after yours.  Also, if you miss three or more appointments without notifying the office, you may be dismissed from the clinic at the provider's discretion.      For prescription refill requests, have your pharmacy contact our office and allow 72 hours for refills to be completed.    Today you received the following chemotherapy and/or immunotherapy agents Velcade & Darzalex  Daratumumab injection What is this medicine? DARATUMUMAB (dar a toom ue mab) is a monoclonal antibody. It is used to treat multiple myeloma. This medicine may be used for other purposes; ask your health care provider or pharmacist if you have questions. COMMON BRAND NAME(S): DARZALEX What should I tell my health care provider before I take this medicine? They need to know if you have any of these conditions:  hereditary fructose intolerance  infection (especially a virus infection such as chickenpox, herpes, or hepatitis B virus)  lung or breathing disease (asthma, COPD)  an unusual or allergic reaction to daratumumab, sorbitol, other medicines, foods, dyes, or preservatives  pregnant or trying to get pregnant  breast-feeding How should I use this medicine? This medicine is for infusion into a vein. It is given by a health care professional in a hospital or clinic setting. Talk to your pediatrician regarding the  use of this medicine in children. Special care may be needed. Overdosage: If you think you have taken too much of this medicine contact a poison control center or emergency room at once. NOTE: This medicine is only for you. Do not share this medicine with others. What if I miss a dose? Keep appointments for follow-up doses as directed. It is important not to miss your dose. Call your doctor or health care professional if you are unable to keep an appointment. What may interact with this medicine? Interactions have not been studied. This list may not describe all possible interactions. Give your health care provider a list of all the medicines, herbs, non-prescription drugs, or dietary supplements you use. Also tell them if you smoke, drink alcohol, or use illegal drugs. Some items may interact with your medicine. What should I watch for while using this medicine? Your condition will be monitored carefully while you are receiving this medicine. This medicine can cause serious allergic reactions. To reduce your risk, your health care provider may give you other medicine to take before receiving this one. Be sure to follow the directions from your health care provider. This medicine can affect the results of blood tests to match your blood type. These changes can last for up to 6 months after the final dose. Your healthcare provider will do blood tests to match your blood type before you start treatment. Tell all of your healthcare providers that you are being treated with this medicine before receiving a blood transfusion. This medicine can affect the results of some tests used to determine treatment response; extra tests may   be needed to evaluate response. Do not become pregnant while taking this medicine or for 3 months after stopping it. Women should inform their health care provider if they wish to become pregnant or think they might be pregnant. There is a potential for serious side effects to an  unborn child. Talk to your health care provider for more information. Do not breast-feed an infant while taking this medicine. What side effects may I notice from receiving this medicine? Side effects that you should report to your doctor or health care professional as soon as possible:  allergic reactions (skin rash; itching or hives; swelling of the face, lips, or tongue)  infection (fever, chills, cough, sore throat, pain or difficulty passing urine)  infusion reaction (dizziness, fast heartbeat, feeling faint or lightheaded, falls, headache, increase in blood pressure, nausea, vomiting, or wheezing or trouble breathing with loud or whistling sounds)  unusual bleeding or bruising Side effects that usually do not require medical attention (report to your doctor or health care professional if they continue or are bothersome):  constipation  diarrhea  pain, tingling, numbness in the hands or feet  swelling of the ankles, feet, hands  tiredness This list may not describe all possible side effects. Call your doctor for medical advice about side effects. You may report side effects to FDA at 1-800-FDA-1088. Where should I keep my medicine? This drug is given in a hospital or clinic and will not be stored at home. NOTE: This sheet is a summary. It may not cover all possible information. If you have questions about this medicine, talk to your doctor, pharmacist, or health care provider.  2021 Elsevier/Gold Standard (2020-03-24 13:28:52) Bortezomib injection What is this medicine? BORTEZOMIB (bor TEZ oh mib) targets proteins in cancer cells and stops the cancer cells from growing. It treats multiple myeloma and mantle cell lymphoma. This medicine may be used for other purposes; ask your health care provider or pharmacist if you have questions. COMMON BRAND NAME(S): Velcade What should I tell my health care provider before I take this medicine? They need to know if you have any of these  conditions:  dehydration  diabetes (high blood sugar)  heart disease  liver disease  tingling of the fingers or toes or other nerve disorder  an unusual or allergic reaction to bortezomib, mannitol, boron, other medicines, foods, dyes, or preservatives  pregnant or trying to get pregnant  breast-feeding How should I use this medicine? This medicine is injected into a vein or under the skin. It is given by a health care provider in a hospital or clinic setting. Talk to your health care provider about the use of this medicine in children. Special care may be needed. Overdosage: If you think you have taken too much of this medicine contact a poison control center or emergency room at once. NOTE: This medicine is only for you. Do not share this medicine with others. What if I miss a dose? Keep appointments for follow-up doses. It is important not to miss your dose. Call your health care provider if you are unable to keep an appointment. What may interact with this medicine? This medicine may interact with the following medications:  ketoconazole  rifampin This list may not describe all possible interactions. Give your health care provider a list of all the medicines, herbs, non-prescription drugs, or dietary supplements you use. Also tell them if you smoke, drink alcohol, or use illegal drugs. Some items may interact with your medicine. What should I watch   for while using this medicine? Your condition will be monitored carefully while you are receiving this medicine. You may need blood work done while you are taking this medicine. You may get drowsy or dizzy. Do not drive, use machinery, or do anything that needs mental alertness until you know how this medicine affects you. Do not stand up or sit up quickly, especially if you are an older patient. This reduces the risk of dizzy or fainting spells This medicine may increase your risk of getting an infection. Call your health care  provider for advice if you get a fever, chills, sore throat, or other symptoms of a cold or flu. Do not treat yourself. Try to avoid being around people who are sick. Check with your health care provider if you have severe diarrhea, nausea, and vomiting, or if you sweat a lot. The loss of too much body fluid may make it dangerous for you to take this medicine. Do not become pregnant while taking this medicine or for 7 months after stopping it. Women should inform their health care provider if they wish to become pregnant or think they might be pregnant. Men should not father a child while taking this medicine and for 4 months after stopping it. There is a potential for serious harm to an unborn child. Talk to your health care provider for more information. Do not breast-feed an infant while taking this medicine or for 2 months after stopping it. This medicine may make it more difficult to get pregnant or father a child. Talk to your health care provider if you are concerned about your fertility. What side effects may I notice from receiving this medicine? Side effects that you should report to your doctor or health care professional as soon as possible:  allergic reactions (skin rash; itching or hives; swelling of the face, lips, or tongue)  bleeding (bloody or black, tarry stools; red or dark brown urine; spitting up blood or brown material that looks like coffee grounds; red spots on the skin; unusual bruising or bleeding from the eye, gums, or nose)  blurred vision or changes in vision  confusion  constipation  headache  heart failure (trouble breathing; fast, irregular heartbeat; sudden weight gain; swelling of the ankles, feet, hands)  infection (fever, chills, cough, sore throat, pain or trouble passing urine)  lack or loss of appetite  liver injury (dark yellow or brown urine; general ill feeling or flu-like symptoms; loss of appetite, right upper belly pain; yellowing of the eyes or  skin)  low blood pressure (dizziness; feeling faint or lightheaded, falls; unusually weak or tired)  muscle cramps  pain, redness, or irritation at site where injected  pain, tingling, numbness in the hands or feet  seizures  trouble breathing  unusual bruising or bleeding Side effects that usually do not require medical attention (report to your doctor or health care professional if they continue or are bothersome):  diarrhea  nausea  stomach pain  trouble sleeping  vomiting This list may not describe all possible side effects. Call your doctor for medical advice about side effects. You may report side effects to FDA at 1-800-FDA-1088. Where should I keep my medicine? This medicine is given in a hospital or clinic. It will not be stored at home. NOTE: This sheet is a summary. It may not cover all possible information. If you have questions about this medicine, talk to your doctor, pharmacist, or health care provider.  2021 Elsevier/Gold Standard (2020-03-24 13:22:53)         To help prevent nausea and vomiting after your treatment, we encourage you to take your nausea medication as directed.  BELOW ARE SYMPTOMS THAT SHOULD BE REPORTED IMMEDIATELY: . *FEVER GREATER THAN 100.4 F (38 C) OR HIGHER . *CHILLS OR SWEATING . *NAUSEA AND VOMITING THAT IS NOT CONTROLLED WITH YOUR NAUSEA MEDICATION . *UNUSUAL SHORTNESS OF BREATH . *UNUSUAL BRUISING OR BLEEDING . *URINARY PROBLEMS (pain or burning when urinating, or frequent urination) . *BOWEL PROBLEMS (unusual diarrhea, constipation, pain near the anus) . TENDERNESS IN MOUTH AND THROAT WITH OR WITHOUT PRESENCE OF ULCERS (sore throat, sores in mouth, or a toothache) . UNUSUAL RASH, SWELLING OR PAIN  . UNUSUAL VAGINAL DISCHARGE OR ITCHING   Items with * indicate a potential emergency and should be followed up as soon as possible or go to the Emergency Department if any problems should occur.  Please show the CHEMOTHERAPY ALERT  CARD or IMMUNOTHERAPY ALERT CARD at check-in to the Emergency Department and triage nurse.  Should you have questions after your visit or need to cancel or reschedule your appointment, please contact [facility]  [phone] and follow the prompts.  Office hours are 8:00 a.m. to 4:30 p.m. Monday - Friday. Please note that voicemails left after 4:00 p.m. may not be returned until the following business day.  We are closed weekends and major holidays. You have access to a nurse at all times for urgent questions. Please call the main number to the clinic [phone] and follow the prompts.  For any non-urgent questions, you may also contact your provider using MyChart. We now offer e-Visits for anyone 53 and older to request care online for non-urgent symptoms. For details visit [email or web address].   Also download the MyChart app! Go to the app store, search "MyChart", open the app, select [facility], and log in with your MyChart username and password.  Due to Covid, a mask is required upon entering the hospital/clinic. If you do not have a mask, one will be given to you upon arrival. For doctor visits, patients may have 1 support person aged 15 or older with them. For treatment visits, patients cannot have anyone with them due to current Covid guidelines and our immunocompromised population.

## 2020-09-07 NOTE — Progress Notes (Signed)
Nutrition Follow-up:  Patient with monoclonal gammopathy with AL amyloidosis.  Patient receiving DaraCyBorD.  Met with patient during infusion.  Patient reports that her appetite was decrease for a few days but has been improving. Reports that she has been feeling hungry.  Says that edema has improved in legs and stomach area.    Noted seen by cardiology and increased lasix.      Medications: reviewed  Labs: Na 134, glucose 243, creatinine 1.23  Anthropometrics:   Weight 142 1.6 oz (?? Fluid loss)  157 lb 5/4 163 lb 3 oz on 4/27   NUTRITION DIAGNOSIS: Food and nutrition knowledge deficit improved   INTERVENTION:  Encouraged patient to consume good sources of lean protein.  Encouraged patient to continue to be mindful of sodium in foods.     MONITORING, EVALUATION, GOAL: Weight trends, intake   NEXT VISIT: to be determined with treatment  [facility], [place], [place] Registered Dietitian [phone] (mobile)

## 2020-09-07 NOTE — Progress Notes (Signed)
Bili 3.7, Alk Phos 1204. Per Dr. Tasia Catchings , okay to proceed with treatment.   Patient declined 15 minute post observation. VSS. Discharged home.

## 2020-09-08 ENCOUNTER — Other Ambulatory Visit: Payer: Self-pay | Admitting: Oncology

## 2020-09-09 ENCOUNTER — Telehealth: Payer: Self-pay

## 2020-09-09 ENCOUNTER — Other Ambulatory Visit (HOSPITAL_COMMUNITY): Payer: Self-pay

## 2020-09-09 MED FILL — Cyclophosphamide Cap 50 MG: ORAL | 28 days supply | Qty: 40 | Fill #2 | Status: AC

## 2020-09-09 NOTE — Telephone Encounter (Addendum)
PA request received for Promethazine.  This was refilled on 09/08/20.  Promethazine PA was denied early 08/2020 and patient paid out of pocket for medication.  I discussed this with patient at her last office visit and she was fine with paying out of pocket for the medication and said it only cost her around $8.00.  PA request submitted on Cover My Meds (Key: [id number])

## 2020-09-09 NOTE — Telephone Encounter (Signed)
PA for Promethazine has been denied.  Will forward to pharmacy.

## 2020-09-13 ENCOUNTER — Other Ambulatory Visit: Payer: Self-pay | Admitting: Oncology

## 2020-09-14 ENCOUNTER — Other Ambulatory Visit: Payer: Self-pay | Admitting: Oncology

## 2020-09-14 ENCOUNTER — Other Ambulatory Visit (HOSPITAL_COMMUNITY): Payer: Self-pay

## 2020-09-14 ENCOUNTER — Inpatient Hospital Stay: Payer: Medicare Other

## 2020-09-14 ENCOUNTER — Encounter: Payer: Self-pay | Admitting: Oncology

## 2020-09-14 ENCOUNTER — Inpatient Hospital Stay (HOSPITAL_BASED_OUTPATIENT_CLINIC_OR_DEPARTMENT_OTHER): Payer: Medicare Other | Admitting: Oncology

## 2020-09-14 ENCOUNTER — Telehealth: Payer: Self-pay

## 2020-09-14 ENCOUNTER — Ambulatory Visit: Payer: Medicare Other

## 2020-09-14 ENCOUNTER — Inpatient Hospital Stay: Payer: Medicare Other | Attending: Oncology

## 2020-09-14 VITALS — BP 154/79 | HR 93 | Temp 97.6°F | Resp 16 | Wt 134.6 lb

## 2020-09-14 DIAGNOSIS — R7989 Other specified abnormal findings of blood chemistry: Secondary | ICD-10-CM | POA: Insufficient documentation

## 2020-09-14 DIAGNOSIS — E859 Amyloidosis, unspecified: Secondary | ICD-10-CM

## 2020-09-14 DIAGNOSIS — D631 Anemia in chronic kidney disease: Secondary | ICD-10-CM

## 2020-09-14 DIAGNOSIS — T451X5A Adverse effect of antineoplastic and immunosuppressive drugs, initial encounter: Secondary | ICD-10-CM

## 2020-09-14 DIAGNOSIS — N1831 Chronic kidney disease, stage 3a: Secondary | ICD-10-CM

## 2020-09-14 DIAGNOSIS — R112 Nausea with vomiting, unspecified: Secondary | ICD-10-CM

## 2020-09-14 DIAGNOSIS — Z5111 Encounter for antineoplastic chemotherapy: Secondary | ICD-10-CM | POA: Diagnosis not present

## 2020-09-14 DIAGNOSIS — E876 Hypokalemia: Secondary | ICD-10-CM | POA: Diagnosis not present

## 2020-09-14 DIAGNOSIS — R634 Abnormal weight loss: Secondary | ICD-10-CM

## 2020-09-14 DIAGNOSIS — N1832 Chronic kidney disease, stage 3b: Secondary | ICD-10-CM | POA: Diagnosis not present

## 2020-09-14 DIAGNOSIS — F419 Anxiety disorder, unspecified: Secondary | ICD-10-CM | POA: Diagnosis not present

## 2020-09-14 DIAGNOSIS — D509 Iron deficiency anemia, unspecified: Secondary | ICD-10-CM | POA: Diagnosis not present

## 2020-09-14 DIAGNOSIS — Z79899 Other long term (current) drug therapy: Secondary | ICD-10-CM | POA: Insufficient documentation

## 2020-09-14 DIAGNOSIS — E8581 Light chain (AL) amyloidosis: Secondary | ICD-10-CM | POA: Diagnosis present

## 2020-09-14 DIAGNOSIS — K8020 Calculus of gallbladder without cholecystitis without obstruction: Secondary | ICD-10-CM | POA: Insufficient documentation

## 2020-09-14 LAB — COMPREHENSIVE METABOLIC PANEL
ALT: 41 U/L (ref 0–44)
AST: 69 U/L — ABNORMAL HIGH (ref 15–41)
Albumin: 2.1 g/dL — ABNORMAL LOW (ref 3.5–5.0)
Alkaline Phosphatase: 1306 U/L — ABNORMAL HIGH (ref 38–126)
Anion gap: 10 (ref 5–15)
BUN: 20 mg/dL (ref 8–23)
CO2: 26 mmol/L (ref 22–32)
Calcium: 8.6 mg/dL — ABNORMAL LOW (ref 8.9–10.3)
Chloride: 102 mmol/L (ref 98–111)
Creatinine, Ser: 1.17 mg/dL — ABNORMAL HIGH (ref 0.44–1.00)
GFR, Estimated: 48 mL/min — ABNORMAL LOW (ref >60)
Glucose, Bld: 159 mg/dL — ABNORMAL HIGH (ref 70–99)
Potassium: 3.3 mmol/L — ABNORMAL LOW (ref 3.5–5.1)
Sodium: 138 mmol/L (ref 135–145)
Total Bilirubin: 3.3 mg/dL — ABNORMAL HIGH (ref 0.3–1.2)
Total Protein: 5.4 g/dL — ABNORMAL LOW (ref 6.5–8.1)

## 2020-09-14 LAB — CBC WITH DIFFERENTIAL/PLATELET
Abs Immature Granulocytes: 0.07 10*3/uL (ref 0.00–0.07)
Basophils Absolute: 0 10*3/uL (ref 0.0–0.1)
Basophils Relative: 0 %
Eosinophils Absolute: 0.2 10*3/uL (ref 0.0–0.5)
Eosinophils Relative: 2 %
HCT: 25.8 % — ABNORMAL LOW (ref 36.0–46.0)
Hemoglobin: 8.8 g/dL — ABNORMAL LOW (ref 12.0–15.0)
Immature Granulocytes: 1 %
Lymphocytes Relative: 28 %
Lymphs Abs: 2.2 10*3/uL (ref 0.7–4.0)
MCH: 25.9 pg — ABNORMAL LOW (ref 26.0–34.0)
MCHC: 34.1 g/dL (ref 30.0–36.0)
MCV: 75.9 fL — ABNORMAL LOW (ref 80.0–100.0)
Monocytes Absolute: 0.6 10*3/uL (ref 0.1–1.0)
Monocytes Relative: 8 %
Neutro Abs: 4.8 10*3/uL (ref 1.7–7.7)
Neutrophils Relative %: 61 %
Platelets: 279 10*3/uL (ref 150–400)
RBC: 3.4 MIL/uL — ABNORMAL LOW (ref 3.87–5.11)
RDW: 26.5 % — ABNORMAL HIGH (ref 11.5–15.5)
Smear Review: NORMAL
WBC: 7.9 10*3/uL (ref 4.0–10.5)
nRBC: 1.1 % — ABNORMAL HIGH (ref 0.0–0.2)

## 2020-09-14 MED ORDER — MEGESTROL ACETATE 400 MG/10ML PO SUSP: 400.0000 mg | Freq: Every day | ORAL | 0 refills | Status: DC

## 2020-09-14 MED ORDER — DEXAMETHASONE 4 MG PO TABS
20.0000 mg | ORAL_TABLET | Freq: Once | ORAL | Status: AC
  Administered 2020-09-14: 20 mg via ORAL
  Filled 2020-09-14: qty 5

## 2020-09-14 MED ORDER — SODIUM CHLORIDE 0.9 % IV SOLN
Freq: Once | INTRAVENOUS | Status: AC
  Filled 2020-09-14: qty 250

## 2020-09-14 MED ORDER — SODIUM CHLORIDE 0.9 % IV SOLN
150.0000 mg | Freq: Once | INTRAVENOUS | Status: AC
  Administered 2020-09-14: 150 mg via INTRAVENOUS
  Filled 2020-09-14: qty 150

## 2020-09-14 MED ORDER — POTASSIUM CHLORIDE ER 10 MEQ PO TBCR: 20.0000 meq | EXTENDED_RELEASE_TABLET | Freq: Every day | ORAL | 1 refills | Status: DC

## 2020-09-14 MED ORDER — PALONOSETRON HCL INJECTION 0.25 MG/5ML
0.2500 mg | Freq: Once | INTRAVENOUS | Status: AC
  Administered 2020-09-14: 0.25 mg via INTRAVENOUS
  Filled 2020-09-14: qty 5

## 2020-09-14 MED ORDER — BORTEZOMIB CHEMO SQ INJECTION 3.5 MG (2.5MG/ML)
0.7000 mg/m2 | Freq: Once | INTRAMUSCULAR | Status: AC
  Administered 2020-09-14: 1.25 mg via SUBCUTANEOUS
  Filled 2020-09-14: qty 0.5

## 2020-09-14 NOTE — Patient Instructions (Signed)
[facility]    Discharge Instructions:  Thank you for choosing [facility] to provide your oncology and hematology care.  If you have a lab appointment with the [facility], please go directly to the [facility] and check in at the registration area.  Wear comfortable clothing and clothing appropriate for easy access to any Portacath or PICC line.   We strive to give you quality time with your provider. You may need to reschedule your appointment if you arrive late (15 or more minutes).  Arriving late affects you and other patients whose appointments are after yours.  Also, if you miss three or more appointments without notifying the office, you may be dismissed from the clinic at the provider's discretion.      For prescription refill requests, have your pharmacy contact our office and allow 72 hours for refills to be completed.    Today you received the following chemotherapy and/or immunotherapy agents: Velcade.      To help prevent nausea and vomiting after your treatment, we encourage you to take your nausea medication as directed.  BELOW ARE SYMPTOMS THAT SHOULD BE REPORTED IMMEDIATELY: . *FEVER GREATER THAN 100.4 F (38 C) OR HIGHER . *CHILLS OR SWEATING . *NAUSEA AND VOMITING THAT IS NOT CONTROLLED WITH YOUR NAUSEA MEDICATION . *UNUSUAL SHORTNESS OF BREATH . *UNUSUAL BRUISING OR BLEEDING . *URINARY PROBLEMS (pain or burning when urinating, or frequent urination) . *BOWEL PROBLEMS (unusual diarrhea, constipation, pain near the anus) . TENDERNESS IN MOUTH AND THROAT WITH OR WITHOUT PRESENCE OF ULCERS (sore throat, sores in mouth, or a toothache) . UNUSUAL RASH, SWELLING OR PAIN  . UNUSUAL VAGINAL DISCHARGE OR ITCHING   Items with * indicate a potential emergency and should be followed up as soon as possible or go to the Emergency Department if any problems should occur.  Please show the CHEMOTHERAPY ALERT CARD or  IMMUNOTHERAPY ALERT CARD at check-in to the Emergency Department and triage nurse.  Should you have questions after your visit or need to cancel or reschedule your appointment, please contact [facility]  [phone] and follow the prompts.  Office hours are 8:00 a.m. to 4:30 p.m. Monday - Friday. Please note that voicemails left after 4:00 p.m. may not be returned until the following business day.  We are closed weekends and major holidays. You have access to a nurse at all times for urgent questions. Please call the main number to the clinic [phone] and follow the prompts.  For any non-urgent questions, you may also contact your provider using MyChart. We now offer e-Visits for anyone 43 and older to request care online for non-urgent symptoms. For details visit [email or web address].   Also download the MyChart app! Go to the app store, search "MyChart", open the app, select [facility], and log in with your MyChart username and password.  Due to Covid, a mask is required upon entering the hospital/clinic. If you do not have a mask, one will be given to you upon arrival. For doctor visits, patients may have 1 support person aged 70 or older with them. For treatment visits, patients cannot have anyone with them due to current Covid guidelines and our immunocompromised population.   Bortezomib injection  What is this medicine? BORTEZOMIB (bor TEZ oh mib) targets proteins in cancer cells and stops the cancer cells from growing. It treats multiple myeloma and mantle cell lymphoma. This medicine may be used for other purposes; ask your  health care provider or pharmacist if you have questions. COMMON BRAND NAME(S): Velcade What should I tell my health care provider before I take this medicine? They need to know if you have any of these conditions:  dehydration  diabetes (high blood sugar)  heart disease  liver disease  tingling of the fingers or  toes or other nerve disorder  an unusual or allergic reaction to bortezomib, mannitol, boron, other medicines, foods, dyes, or preservatives  pregnant or trying to get pregnant  breast-feeding How should I use this medicine? This medicine is injected into a vein or under the skin. It is given by a health care provider in a hospital or clinic setting. Talk to your health care provider about the use of this medicine in children. Special care may be needed. Overdosage: If you think you have taken too much of this medicine contact a poison control center or emergency room at once. NOTE: This medicine is only for you. Do not share this medicine with others. What if I miss a dose? Keep appointments for follow-up doses. It is important not to miss your dose. Call your health care provider if you are unable to keep an appointment. What may interact with this medicine? This medicine may interact with the following medications:  ketoconazole  rifampin This list may not describe all possible interactions. Give your health care provider a list of all the medicines, herbs, non-prescription drugs, or dietary supplements you use. Also tell them if you smoke, drink alcohol, or use illegal drugs. Some items may interact with your medicine. What should I watch for while using this medicine? Your condition will be monitored carefully while you are receiving this medicine. You may need blood work done while you are taking this medicine. You may get drowsy or dizzy. Do not drive, use machinery, or do anything that needs mental alertness until you know how this medicine affects you. Do not stand up or sit up quickly, especially if you are an older patient. This reduces the risk of dizzy or fainting spells This medicine may increase your risk of getting an infection. Call your health care provider for advice if you get a fever, chills, sore throat, or other symptoms of a cold or flu. Do not treat yourself. Try to  avoid being around people who are sick. Check with your health care provider if you have severe diarrhea, nausea, and vomiting, or if you sweat a lot. The loss of too much body fluid may make it dangerous for you to take this medicine. Do not become pregnant while taking this medicine or for 7 months after stopping it. Women should inform their health care provider if they wish to become pregnant or think they might be pregnant. Men should not father a child while taking this medicine and for 4 months after stopping it. There is a potential for serious harm to an unborn child. Talk to your health care provider for more information. Do not breast-feed an infant while taking this medicine or for 2 months after stopping it. This medicine may make it more difficult to get pregnant or father a child. Talk to your health care provider if you are concerned about your fertility. What side effects may I notice from receiving this medicine? Side effects that you should report to your doctor or health care professional as soon as possible:  allergic reactions (skin rash; itching or hives; swelling of the face, lips, or tongue)  bleeding (bloody or black, tarry stools; red  or dark brown urine; spitting up blood or brown material that looks like coffee grounds; red spots on the skin; unusual bruising or bleeding from the eye, gums, or nose)  blurred vision or changes in vision  confusion  constipation  headache  heart failure (trouble breathing; fast, irregular heartbeat; sudden weight gain; swelling of the ankles, feet, hands)  infection (fever, chills, cough, sore throat, pain or trouble passing urine)  lack or loss of appetite  liver injury (dark yellow or brown urine; general ill feeling or flu-like symptoms; loss of appetite, right upper belly pain; yellowing of the eyes or skin)  low blood pressure (dizziness; feeling faint or lightheaded, falls; unusually weak or tired)  muscle cramps  pain,  redness, or irritation at site where injected  pain, tingling, numbness in the hands or feet  seizures  trouble breathing  unusual bruising or bleeding Side effects that usually do not require medical attention (report to your doctor or health care professional if they continue or are bothersome):  diarrhea  nausea  stomach pain  trouble sleeping  vomiting This list may not describe all possible side effects. Call your doctor for medical advice about side effects. You may report side effects to FDA at 1-800-FDA-1088. Where should I keep my medicine? This medicine is given in a hospital or clinic. It will not be stored at home. NOTE: This sheet is a summary. It may not cover all possible information. If you have questions about this medicine, talk to your doctor, pharmacist, or health care provider.  2021 Elsevier/Gold Standard (2020-03-24 13:22:53)

## 2020-09-14 NOTE — Telephone Encounter (Signed)
Per secure chat from Dr. Tasia Catchings: please inform patient or her son or daughter that the marinol I mentioned during her visit today may not work well due to her liver function. I switch to liquid megace  and rx has been sent to [facility].  Called [name] (daughter ) and spoke to her and patient to let them know about MD message. She stated that medication was picked up.

## 2020-09-14 NOTE — Progress Notes (Signed)
Total Bilirubin: 3.3. MD, Dr. Tasia Catchings, notified and aware. Per MD order: proceed with scheduled Velcade treatment today, no Darzalex Faspro today, and patient is to take PO Cytoxan at home today as well.

## 2020-09-14 NOTE — Progress Notes (Signed)
Patient has an increase in fatigue/weakness.  Decrease in appetite with 8 lb wt loss since last documented weight.

## 2020-09-14 NOTE — Progress Notes (Signed)
Hematology/Oncology  Follow up note [facility] [phone] [phone]   Patient Care Team: Fitzgerald, David P, MD as PCP - General (Infectious Diseases)  REFERRING PROVIDER: Fitzgerald, David P, MD  CHIEF COMPLAINTS/REASON FOR VISIT:  Follow-up for amyloidosis  HISTORY OF PRESENTING ILLNESS:   Nancy Rodriguez is a  75 y.o.  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  Work up include protein electrophoresis showed M protein 0.7,   Reviewed her previous medical records via care everywhere.  She was seen by Hematology Oncology at [facility] on 02/14/2017.  07/25/2007  SPEP showed M protein of 0.35, IFE showed IgG lamda 09/22/2007  Bone survey negative.  02/15/14 IgG 930, SPEP M protien 0.22, free kappa light chain 3.59, lamda 2.92, ratio 1.23  Hypercalcemia, resolved after stopping HCTZ.  # Transaminitis 03/28/20 US abdomen showed left liver lobe Complex 1.7 cm cyst  # 04/07/20 MRI abdomen w/wo contrast showed 2 benign liver cysts, 2 nonspecific hypovascular irighr liver lesions, abnormal bone marrow signal of L1 vertebral body. Patient was  advised to proceed with bone marrow biopsy and she declined.  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  Patient changed her mind and ask a biopsy to be canceled.  My team and I had multiple phone discussion with patient's daughter Nancy Rodriguez and later with the patient's son Nancy Rodriguez.  Patient agreed with bone marrow biopsy and a biopsy was scheduled on 06/09/2020.  05/20/20 NT proBNP  was normal,  TSH slightly increased, normal T4 Normal factor X Normal coags Troponin 19. Refer to  cardiology for evaluation. May need cardiac MRI  # 06/01/2020 2D echo result was reviewed and discussed with patient.  Patient has normal LVEF 60-65%. Mild asymmetric left ventricular hypertrophy.  Left ventricular diastolic parameters are indeterminate. average left ventricular global longitudinal strain is -16.8 %.  # 06/09/2020, bone marrow biopsy showed monoclonal plasmacytosis, 8%, amyloid deposit present.  Absent iron stores. 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 t (14;20) #06/17/2020, further discussed about diagnosis and treatment plan.  Patient declined bone marrow transplant evaluation.  Decision was made to proceed with Dara-CyborD chemotherapy treatments. #May 2022, second opinion at [facility] with Dr. Kang.who agrees with current treatment plan.  He recommends to lower dexamethasone to 20 mg and decrease premed Tylenol to 325 mg  09/01/2020 cardiac MRI was performed at [facility].  Left ventricle is normal in size.  Mild to moderate basal septal hypertrophy.  Hyper dynamic LV function.  LVEF 71%.  Right ventricle is normal in size and wall thickness.  Systolic function normal.  Mild bi atrial enlargement, no significant aortic valve stenosis or regurgitation.  Trivial mitral regurgitation, mild tricuspid regurgitation and a trivial pulmonic regurgitation.  No evidence of MI, scarring or infiltration.  #History of major depression listed in outside problem list.  previous diagnosis and treatment details are not available to me in EMR  I have referred her to psychiatrist  INTERVAL HISTORY Nancy Rodriguez is a 75 y.o. female who has above history reviewed by me today presents for follow up visit for management of AL amyloidosis Problems and complaints   are listed below: Patient presents for the evaluation prior to Dara-CyborD treatment. Nausea has improved with antiemetics.  Weight loss, lost 8 pounds since last visit Patient does not take  nutrition supplements as advised.  He reports feeling more fatigued and weak today.  With further questioning, she states that she has been feeling stronger for the past 1 week.  The reason she feels tired and fatigued this morning is because she stayed up all night to watch tennis on TV.  No complaints of nausea vomiting diarrhea.  It is unclear if she is taking Remeron as recommended by both me and nephrology.   Review of Systems  Constitutional: Positive for fatigue and unexpected weight change. Negative for appetite change, chills and fever.  HENT:   Negative for hearing loss, nosebleeds and voice change.   Eyes: Negative for eye problems.  Respiratory: Negative for chest tightness and cough.   Cardiovascular: Negative for chest pain and leg swelling.  Gastrointestinal: Negative for abdominal distention, abdominal pain, blood in stool and nausea.  Endocrine: Negative for hot flashes.  Genitourinary: Negative for difficulty urinating and frequency.   Musculoskeletal: Negative for arthralgias.  Skin: Negative for rash.  Neurological: Negative for extremity weakness and numbness.  Hematological: Negative for adenopathy.  Psychiatric/Behavioral: Negative for confusion.    MEDICAL HISTORY:  Past Medical History:  Diagnosis Date  . Anemia    Anemia in chronic kidney disease  . Chronic kidney disease    Stage 3b chronic kidney disease  . Diabetes mellitus without complication ([place])   . Hypercholesterolemia   . Hypertension   . MGUS (monoclonal gammopathy of unknown significance)   . Osteoarthritis   . Rheumatoid arthritis ([place])     SURGICAL HISTORY: Past Surgical History:  Procedure Laterality Date  . HERNIA REPAIR    . PARATHYROIDECTOMY      SOCIAL HISTORY: Social History   Socioeconomic History  . Marital status: Widowed    Spouse name: Not on file  . Number of children: 6  . Years of education: Not on file  . Highest education level: Not on file  Occupational  History  . Not on file  Tobacco Use  . Smoking status: Never Smoker  . Smokeless tobacco: Never Used  Vaping Use  . Vaping Use: Never used  Substance and Sexual Activity  . Alcohol use: No  . Drug use: Never  . Sexual activity: Not on file  Other Topics Concern  . Not on file  Social History Narrative  . Not on file   Social Determinants of Health   Financial Resource Strain: Not on file  Food Insecurity: Not on file  Transportation Needs: Not on file  Physical Activity: Not on file  Stress: Not on file  Social Connections: Not on file  Intimate Partner Violence: Not on file    FAMILY HISTORY: Family History  Problem Relation Age of Onset  . Diabetes Daughter   . Diabetes Son   . Dementia Mother   . Cancer Father   . Esophageal cancer Sister   . Brain cancer Brother     ALLERGIES:  is allergic to benazepril, lisinopril, tolmetin, and nsaids.  MEDICATIONS:  Current Outpatient Medications  Medication Sig Dispense Refill  . acyclovir (ZOVIRAX) 400 MG tablet Take 1 tablet (400 mg total) by mouth 2 (two) times daily. 60 tablet 5  . albuterol (VENTOLIN HFA) 108 (90 Base) MCG/ACT inhaler Inhale 2 puffs into the lungs every 4 (four) hours as needed.    Marland Kitchen  amLODipine (NORVASC) 5 MG tablet Take 5 mg by mouth daily.    . cyclophosphamide (CYTOXAN) 50 MG capsule TAKE 10 CAPSULES (500 MG TOTAL) BY MOUTH ONCE A WEEK. TAKE WITH BREAKFAST. 40 capsule 3  . docusate sodium (COLACE) 100 MG capsule Take 1 capsule by mouth as needed.    Marland Kitchen EPINEPHrine 0.3 mg/0.3 mL IJ SOAJ injection     . ferrous sulfate 325 (65 FE) MG tablet Take 1 tablet by mouth daily.    . furosemide (LASIX) 20 MG tablet Take 2 tablets (40 mg total) by mouth daily. 60 tablet 1  . hydrOXYzine (ATARAX/VISTARIL) 10 MG tablet Take 10 mg by mouth 2 (two) times daily as needed.    . insulin glargine (LANTUS SOLOSTAR) 100 UNIT/ML Solostar Pen Inject 12 Units into the skin 2 (two) times daily. Pt takes in morning and  bedtime.    Marland Kitchen LORazepam (ATIVAN) 0.5 MG tablet Take 1 tablet (0.5 mg total) by mouth every 6 (six) hours as needed (nausea vomiting). 30 tablet 0  . megestrol (MEGACE) 400 MG/10ML suspension Take 10 mLs (400 mg total) by mouth daily. 240 mL 0  . metoprolol succinate (TOPROL-XL) 50 MG 24 hr tablet TAKE 1 TABLET (50 MG TOTAL) BY MOUTH DAILY IN THE MORNING    . mirtazapine (REMERON) 7.5 MG tablet Take 1 tablet (7.5 mg total) by mouth at bedtime. 30 tablet 2  . omeprazole (PRILOSEC) 20 MG capsule Take 1 capsule (20 mg total) by mouth daily. 30 capsule 1  . ondansetron (ZOFRAN) 8 MG tablet Take 8 mg by mouth 30 to 60 min prior to Cytoxan administration then take 8 mg twice daily as needed for nausea and vomiting. 30 tablet 1  . promethazine (PHENERGAN) 25 MG tablet TAKE 1 TABLET BY MOUTH EVERY 8 HOURS AS NEEDED FOR NAUSEA OR VOMITING. 90 tablet 1  . traMADol (ULTRAM) 50 MG tablet Take 1 tablet (50 mg total) by mouth every 6 (six) hours as needed for severe pain. 8 tablet 0  . potassium chloride (KLOR-CON) 10 MEQ tablet Take 2 tablets (20 mEq total) by mouth daily. 60 tablet 1   No current facility-administered medications for this visit.     PHYSICAL EXAMINATION: ECOG PERFORMANCE STATUS: 1 - Symptomatic but completely ambulatory Vitals:   09/14/20 0951  BP: (!) 154/79  Pulse: 93  Resp: 16  Temp: 97.6 F (36.4 C)   Filed Weights   09/14/20 0951  Weight: 134 lb 9.6 oz (61.1 kg)    Physical Exam Constitutional:      General: She is not in acute distress. HENT:     Head: Normocephalic and atraumatic.  Eyes:     General: No scleral icterus. Cardiovascular:     Rate and Rhythm: Normal rate and regular rhythm.     Heart sounds: Murmur heard.    Pulmonary:     Effort: Pulmonary effort is normal. No respiratory distress.     Breath sounds: No wheezing.  Abdominal:     General: Bowel sounds are normal. There is no distension.     Palpations: Abdomen is soft.  Musculoskeletal:         General: Swelling present. No deformity. Normal range of motion.     Cervical back: Normal range of motion and neck supple.     Comments: Bilateral lower extremity 1+ edema  Skin:    General: Skin is warm and dry.     Findings: No erythema or rash.  Neurological:     Mental  Status: She is alert and oriented to person, place, and time. Mental status is at baseline.     Cranial Nerves: No cranial nerve deficit.     Coordination: Coordination normal.  Psychiatric:        Mood and Affect: Mood normal.     LABORATORY DATA:  I have reviewed the data as listed Lab Results  Component Value Date   WBC 7.9 09/14/2020   HGB 8.8 (L) 09/14/2020   HCT 25.8 (L) 09/14/2020   MCV 75.9 (L) 09/14/2020   PLT 279 09/14/2020   Recent Labs    08/31/20 1037 09/07/20 0822 09/14/20 0908  NA 134* 134* 138  K 3.6 3.7 3.3*  CL 101 96* 102  CO2 _0 GLUCOSE 276* 243* 159*  BUN _1 CREATININE 1.19* 1.23* 1.17*  CALCIUM 8.6* 8.5* 8.6*  GFRNONAA 47* 46* 48*  PROT 5.6* 5.5* 5.4*  ALBUMIN 2.2* 2.1* 2.1*  AST 60* 65* 69*  ALT 41 43 41  ALKPHOS 1,221* 1,204* 1,306*  BILITOT 3.7* 3.7* 3.3*   Iron/TIBC/Ferritin/ %Sat    Component Value Date/Time   IRON 35 08/31/2020 1037   TIBC 290 08/31/2020 1037   FERRITIN 82 08/31/2020 1037   IRONPCTSAT 12 08/31/2020 1037     08/25/2019, platelet count 491, WBC 7.5, hemoglobin 12 Creatinine 1.58, EGFR 37, calcium 10.8, albumin 4.2 Negative hepatitis B surface antigen, hepatitis B core antibody, Negative hepatitis C 08/05/2019, A1c 11.2   RADIOGRAPHIC STUDIES: I have personally reviewed the radiological images as listed and agreed with the findings in the report. CT ABDOMEN PELVIS WO CONTRAST  Result Date: 09/02/2020 CLINICAL DATA:  Light chain amyloidosis. Weight loss. Abdominal distension. EXAM: CT ABDOMEN AND PELVIS WITHOUT CONTRAST TECHNIQUE: Multidetector CT imaging of the abdomen and pelvis was performed following the standard protocol  without IV contrast. COMPARISON:  MRI abdomen 04/07/2020 FINDINGS: Lower chest: Lung bases are clear. Hepatobiliary: No focal hepatic lesion on noncontrast exam. Gallstones present in a collapsed gallbladder. Caudate lobe is prominent. Fluid along the RIGHT hepatic margin. Benign cyst in LEFT hepatic lobe. Pancreas: Pancreas is normal. No ductal dilatation. No pancreatic inflammation. Spleen: Normal spleen Adrenals/urinary tract: Adrenal glands normal. No nephrolithiasis ureterolithiasis. No obstructive uropathy. The density of the urine in the bladder is greater than typical (HU equal 27). No IV contrast administered. Stomach/Bowel: Stomach, small bowel, appendix, and cecum are normal. The colon and rectosigmoid colon are normal. Vascular/Lymphatic: Abdominal aorta is normal caliber with atherosclerotic calcification. There is no retroperitoneal or periportal lymphadenopathy. No pelvic lymphadenopathy. Reproductive: Uterus and adnexa unremarkable. Other: Moderate volume of free fluid along the margin liver and collecting in the pelvis. Focal of fluid simple fluid attenuation without organization. Musculoskeletal: No aggressive osseous lesion. IMPRESSION: 1. High-density urine within the bladder. Recommend urinalysis and correlation for cystitis or hematuria. 2. Moderate volume of free fluid in the RIGHT abdomen and pelvis is favored ascites. Recommend correlation with liver function. 3. Cholelithiasis without evidence cholecystitis. These results will be called to the ordering clinician or representative by the Radiologist Assistant, and communication documented in the PACS or [employer]. Electronically Signed   By: Suzy Bouchard M.D.   On: 09/02/2020 09:25   US Abdomen Limited  Result Date: 08/25/2020 CLINICAL DATA:  Ascites. EXAM: LIMITED ABDOMEN ULTRASOUND FOR ASCITES TECHNIQUE: Limited ultrasound survey for ascites was performed in all four abdominal quadrants. COMPARISON:  None. FINDINGS: 4  quadrant assessment of the abdomen/pelvis demonstrates no intraperitoneal free fluid. Small bilateral pleural  effusions noted incidentally. IMPRESSION: No sonographic evidence of ascites. Electronically Signed   By: Misty Stanley M.D.   On: 08/25/2020 09:32   US Venous Img Lower Bilateral  Result Date: 07/27/2020 CLINICAL DATA:  Bilateral lower extremity swelling EXAM: BILATERAL LOWER EXTREMITY VENOUS DOPPLER ULTRASOUND TECHNIQUE: Gray-scale sonography with graded compression, as well as color Doppler and duplex ultrasound were performed to evaluate the lower extremity deep venous systems from the level of the common femoral vein and including the common femoral, femoral, profunda femoral, popliteal and calf veins including the posterior tibial, peroneal and gastrocnemius veins when visible. The superficial great saphenous vein was also interrogated. Spectral Doppler was utilized to evaluate flow at rest and with distal augmentation maneuvers in the common femoral, femoral and popliteal veins. COMPARISON:  None. FINDINGS: RIGHT LOWER EXTREMITY Common Femoral Vein: No evidence of thrombus. Normal compressibility, respiratory phasicity and response to augmentation. Saphenofemoral Junction: No evidence of thrombus. Normal compressibility and flow on color Doppler imaging. Profunda Femoral Vein: No evidence of thrombus. Normal compressibility and flow on color Doppler imaging. Femoral Vein: No evidence of thrombus. Normal compressibility, respiratory phasicity and response to augmentation. Popliteal Vein: No evidence of thrombus. Normal compressibility, respiratory phasicity and response to augmentation. Calf Veins: No evidence of thrombus. Normal compressibility and flow on color Doppler imaging. Other Findings:  Lower extremity subcutaneous edema noted LEFT LOWER EXTREMITY Common Femoral Vein: No evidence of thrombus. Normal compressibility, respiratory phasicity and response to augmentation. Saphenofemoral  Junction: No evidence of thrombus. Normal compressibility and flow on color Doppler imaging. Profunda Femoral Vein: No evidence of thrombus. Normal compressibility and flow on color Doppler imaging. Femoral Vein: No evidence of thrombus. Normal compressibility, respiratory phasicity and response to augmentation. Popliteal Vein: No evidence of thrombus. Normal compressibility, respiratory phasicity and response to augmentation. Calf Veins: No evidence of thrombus. Normal compressibility and flow on color Doppler imaging. Other Findings:  Lower extremity subcutaneous edema noted IMPRESSION: No evidence of deep venous thrombosis in either lower extremity. Electronically Signed   By: Jerilynn Mages.  Shick M.D.   On: 07/27/2020 14:28      ASSESSMENT & PLAN:  1. Light chain (AL) amyloidosis (HCC)   2. Encounter for antineoplastic chemotherapy   3. 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[place])   4. Chemotherapy induced nausea and vomiting   5. Loss of weight   6. Elevated LFTs    # AL-amyloidosis Diagnosis of AL amyloidosis was discussed with patient.  Patient has biopsy confirmed liver involvement, possible kidney involvement- Labs are reviewed Proceed with cycle 3  Day 22 CyBorD- [Daratumumab is now every 2 weeks] Hyperbilirubinemia and abnormal liver function. Bilirubin 3.3 recommend patient to take 350 mg cyclophosphamide today.. Dose reduced Velcade 0.7 mg/m, if liver function is further impaired, consider to decrease to 0.29m/m2   #Chemotherapy-induced nausea, likely secondary to the oral cyclophosphamide.  Continue Emend and Aloxi as premed.  Continue home antiemetic regimen-Zofran, Ativan as instructed. Phenergan 25 mg every 8 hours as needed.  Symptoms are stable  #Hypokalemia, potassium level 3.3.  Recommend potassium chloride 20 mEq daily.  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  Stable creatinine.  24-hour urine protein has improved. Follow up with  nephrology  #Microcytic anemia likely due to iron deficiency, continue oral iron supplementation. #Anxiety, recommend patient to take Remeron-unclear if patient is following the recommendation. #Unintentional weight loss, likely due to poor oral intake.  Discussed with patient about starting appetite stimulant. Marinol not appropriate in the setting of  liver impairment.  We will start patient on Megace 400 mg/21m  daily.  Prescription sent to pharmacy  #Bilateral lower extremity swelling, neg DVT.  Cardiology prescribed Lasix 20 mg daily, continue compression stocking.  Plan was discussed with patient and daughter.  Also discussed her son JJeneen Rinksover the phone. Supportive care measures are necessary for patient well-being and will be provided as necessary. We spent sufficient time to discuss many aspect of care, questions were answered to patient's satisfaction.  I am off next week.  Patient was offered options of seeing covering provider in 1 week with Dara CyBorD versus take a break since she feels fatigued.  Patient clarified that she feels fatigued because of staying up too late last night.  Overall she feels stronger.  Both patient and her son prefer to see covering provider next week for treatments.  Lab covering provider cycle 4-day 1 Dara CyBorD Lab MD cycle 4-day 8 CyBorD  cc FLeonel Ramsay MD   ZEarlie Server MD, PhD Hematology Oncology CRiver [facility]aks Hospitalat AFairview HospitalPager- [phone]/04/2020

## 2020-09-15 ENCOUNTER — Other Ambulatory Visit: Payer: Self-pay

## 2020-09-15 ENCOUNTER — Emergency Department: Payer: Medicare Other

## 2020-09-15 ENCOUNTER — Emergency Department
Admission: EM | Admit: 2020-09-15 | Discharge: 2020-09-15 | Disposition: A | Discharge status: Home / Self Care | Payer: Medicare Other | Attending: Emergency Medicine | Admitting: Emergency Medicine

## 2020-09-15 DIAGNOSIS — E785 Hyperlipidemia, unspecified: Secondary | ICD-10-CM | POA: Insufficient documentation

## 2020-09-15 DIAGNOSIS — E1169 Type 2 diabetes mellitus with other specified complication: Secondary | ICD-10-CM | POA: Insufficient documentation

## 2020-09-15 DIAGNOSIS — S43015A Anterior dislocation of left humerus, initial encounter: Secondary | ICD-10-CM

## 2020-09-15 DIAGNOSIS — W010XXA Fall on same level from slipping, tripping and stumbling without subsequent striking against object, initial encounter: Secondary | ICD-10-CM | POA: Insufficient documentation

## 2020-09-15 DIAGNOSIS — N183 Chronic kidney disease, stage 3 unspecified: Secondary | ICD-10-CM | POA: Insufficient documentation

## 2020-09-15 DIAGNOSIS — Z794 Long term (current) use of insulin: Secondary | ICD-10-CM | POA: Diagnosis not present

## 2020-09-15 DIAGNOSIS — S4992XA Unspecified injury of left shoulder and upper arm, initial encounter: Secondary | ICD-10-CM | POA: Diagnosis present

## 2020-09-15 DIAGNOSIS — Y9301 Activity, walking, marching and hiking: Secondary | ICD-10-CM | POA: Diagnosis not present

## 2020-09-15 DIAGNOSIS — I129 Hypertensive chronic kidney disease with stage 1 through stage 4 chronic kidney disease, or unspecified chronic kidney disease: Secondary | ICD-10-CM | POA: Diagnosis not present

## 2020-09-15 DIAGNOSIS — Y92009 Unspecified place in unspecified non-institutional (private) residence as the place of occurrence of the external cause: Secondary | ICD-10-CM | POA: Diagnosis not present

## 2020-09-15 DIAGNOSIS — E1122 Type 2 diabetes mellitus with diabetic chronic kidney disease: Secondary | ICD-10-CM | POA: Insufficient documentation

## 2020-09-15 DIAGNOSIS — Z79899 Other long term (current) drug therapy: Secondary | ICD-10-CM | POA: Insufficient documentation

## 2020-09-15 IMAGING — DX DG SHOULDER 2+V*L*
3 series · 3 of 3 positions shown · non-contrast
Comparison: [0T] hours today.

CLINICAL DATA: 75-year-old female with anterior left shoulder
dislocation after fall. Post reduction.

EXAM:
LEFT SHOULDER - 2+ VIEW

[shoulder ap]
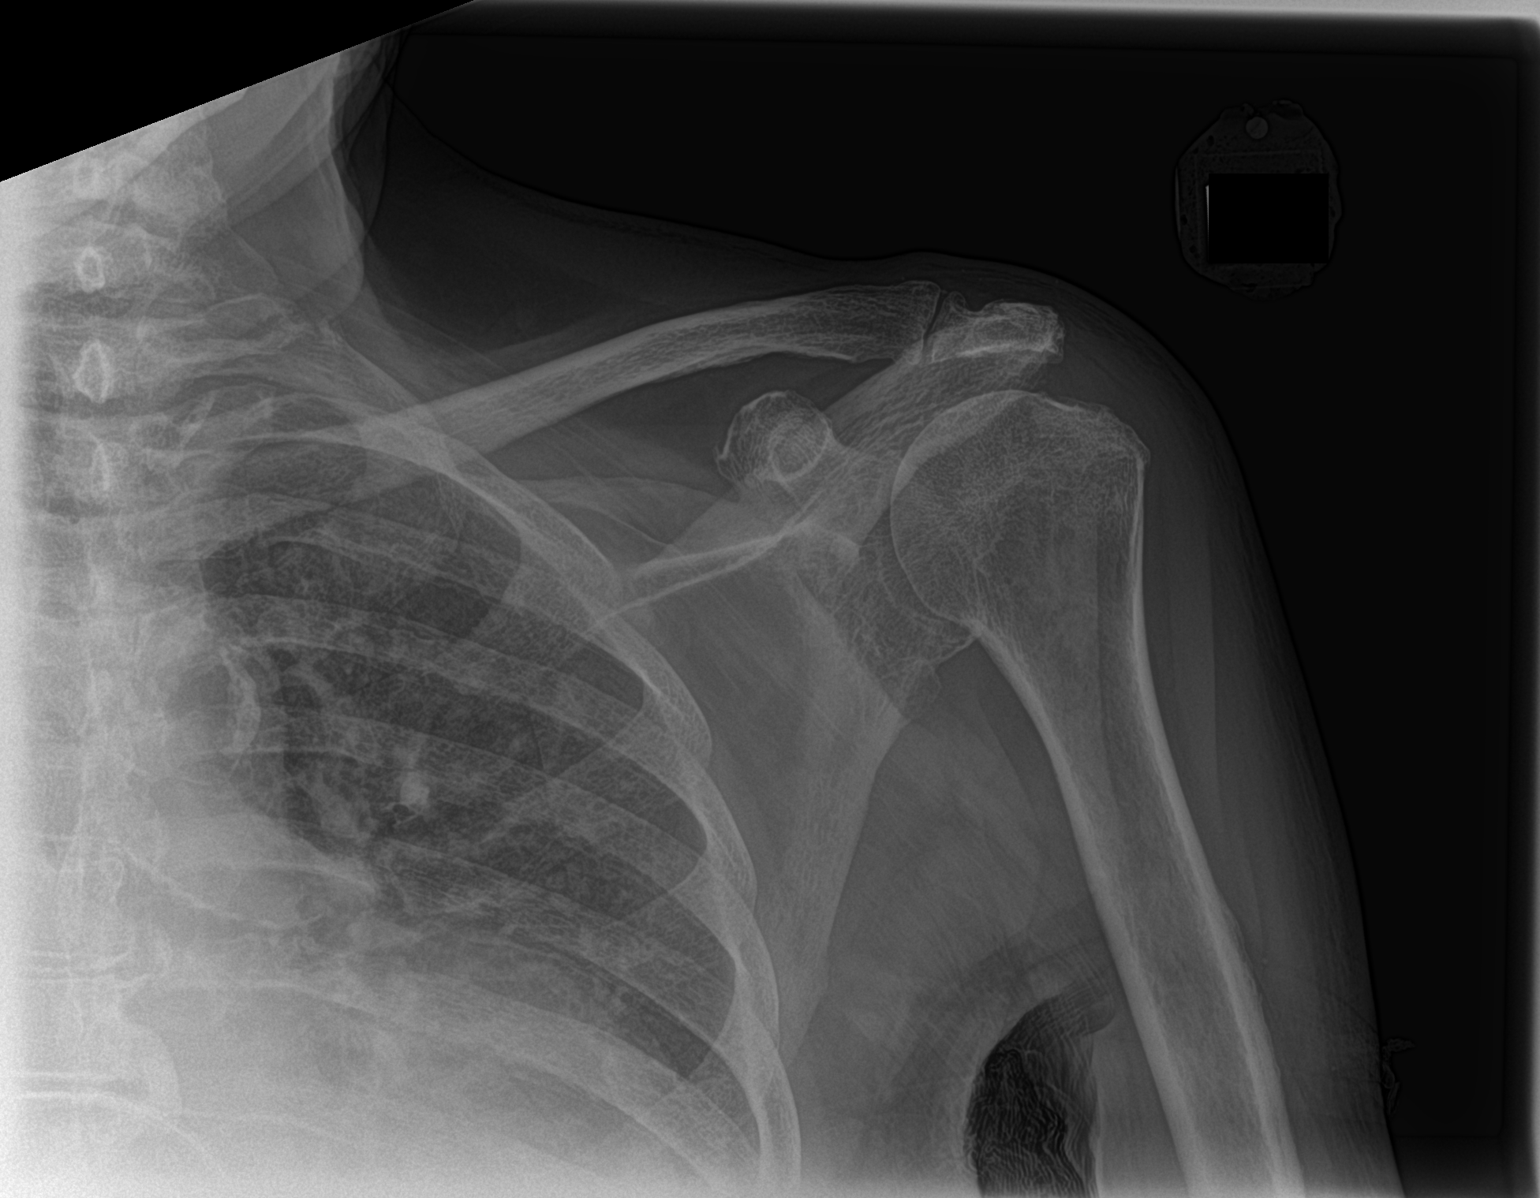

[shoulder obl (1 of 2)]
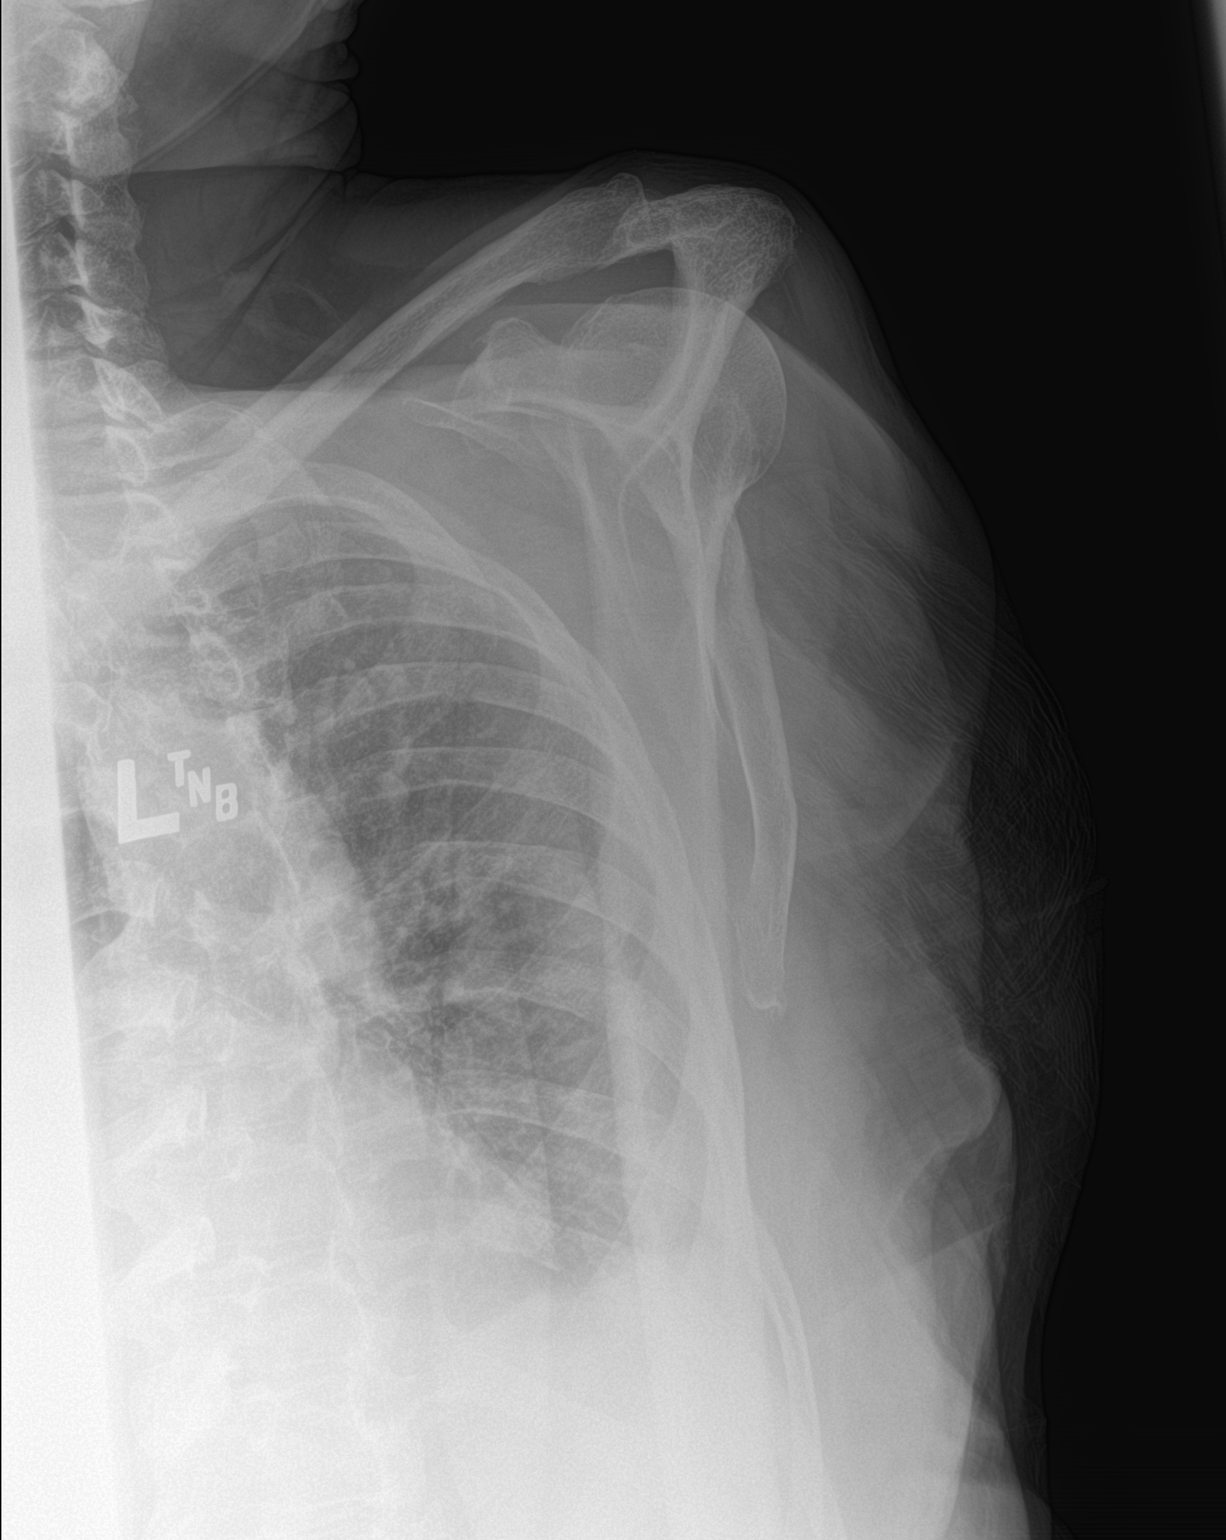

[shoulder obl (2 of 2)]
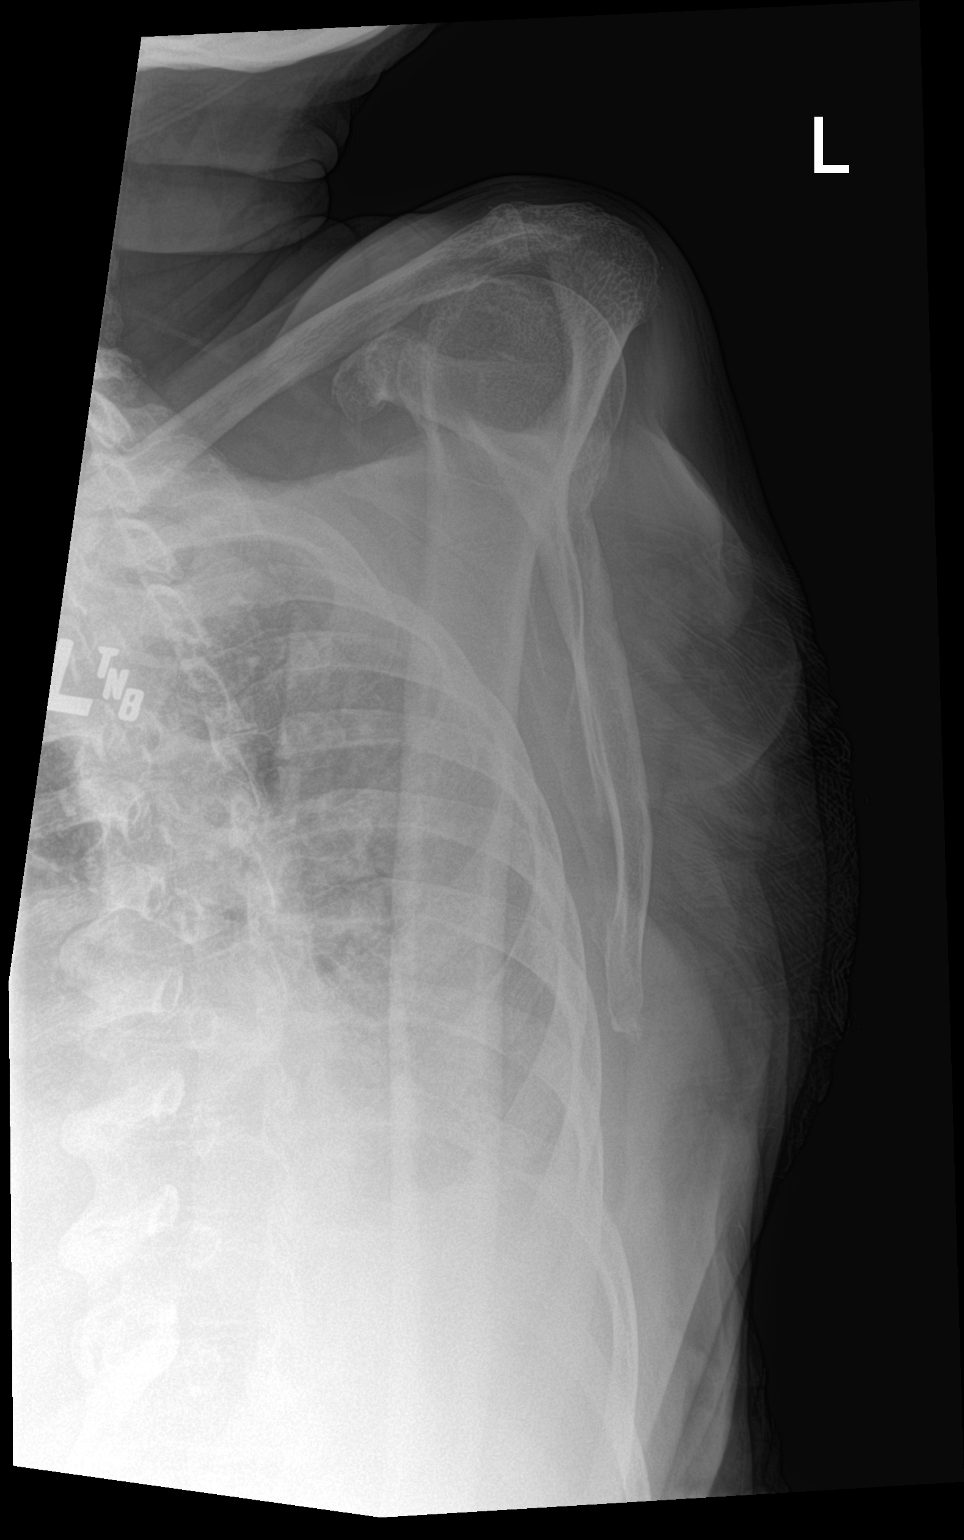

[3 of 3 positions shown; findings below may reference images not displayed]

FINDINGS: Normal glenohumeral joint alignment now. Proximal left humerus and
scapula appear intact. Mild degenerative changes at the left AC
joint. Left clavicle and visible left ribs appear intact.
IMPRESSION: Reduction of the left shoulder dislocation with no fracture
identified.

## 2020-09-15 IMAGING — CR DG SHOULDER 2+V*L*
1 series · 2 of 2 positions shown · non-contrast
Comparison: Left shoulder series [DATE].

CLINICAL DATA: 75-year-old female status post fall and pain.

EXAM:
LEFT SHOULDER - 2+ VIEW

[Series 1: dg shoulder left · 0.14mm/px · 2 of 2 slices shown]
[im 1/2]
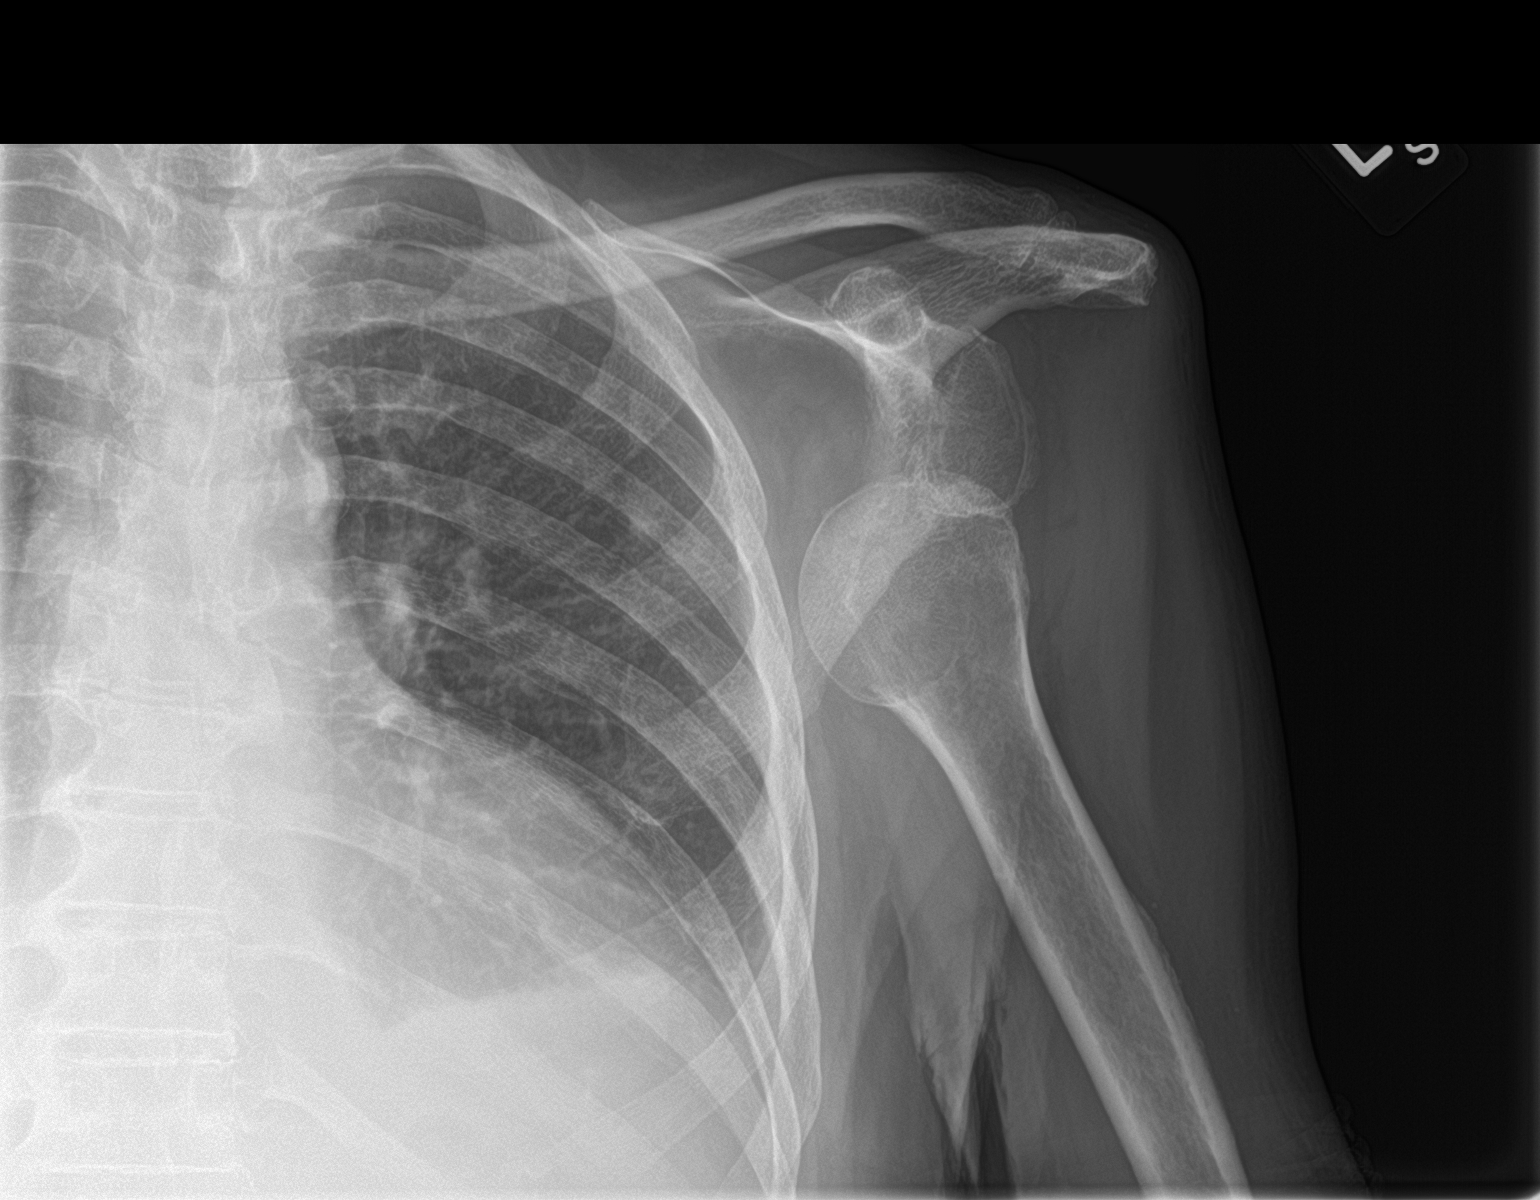
[im 2/2]
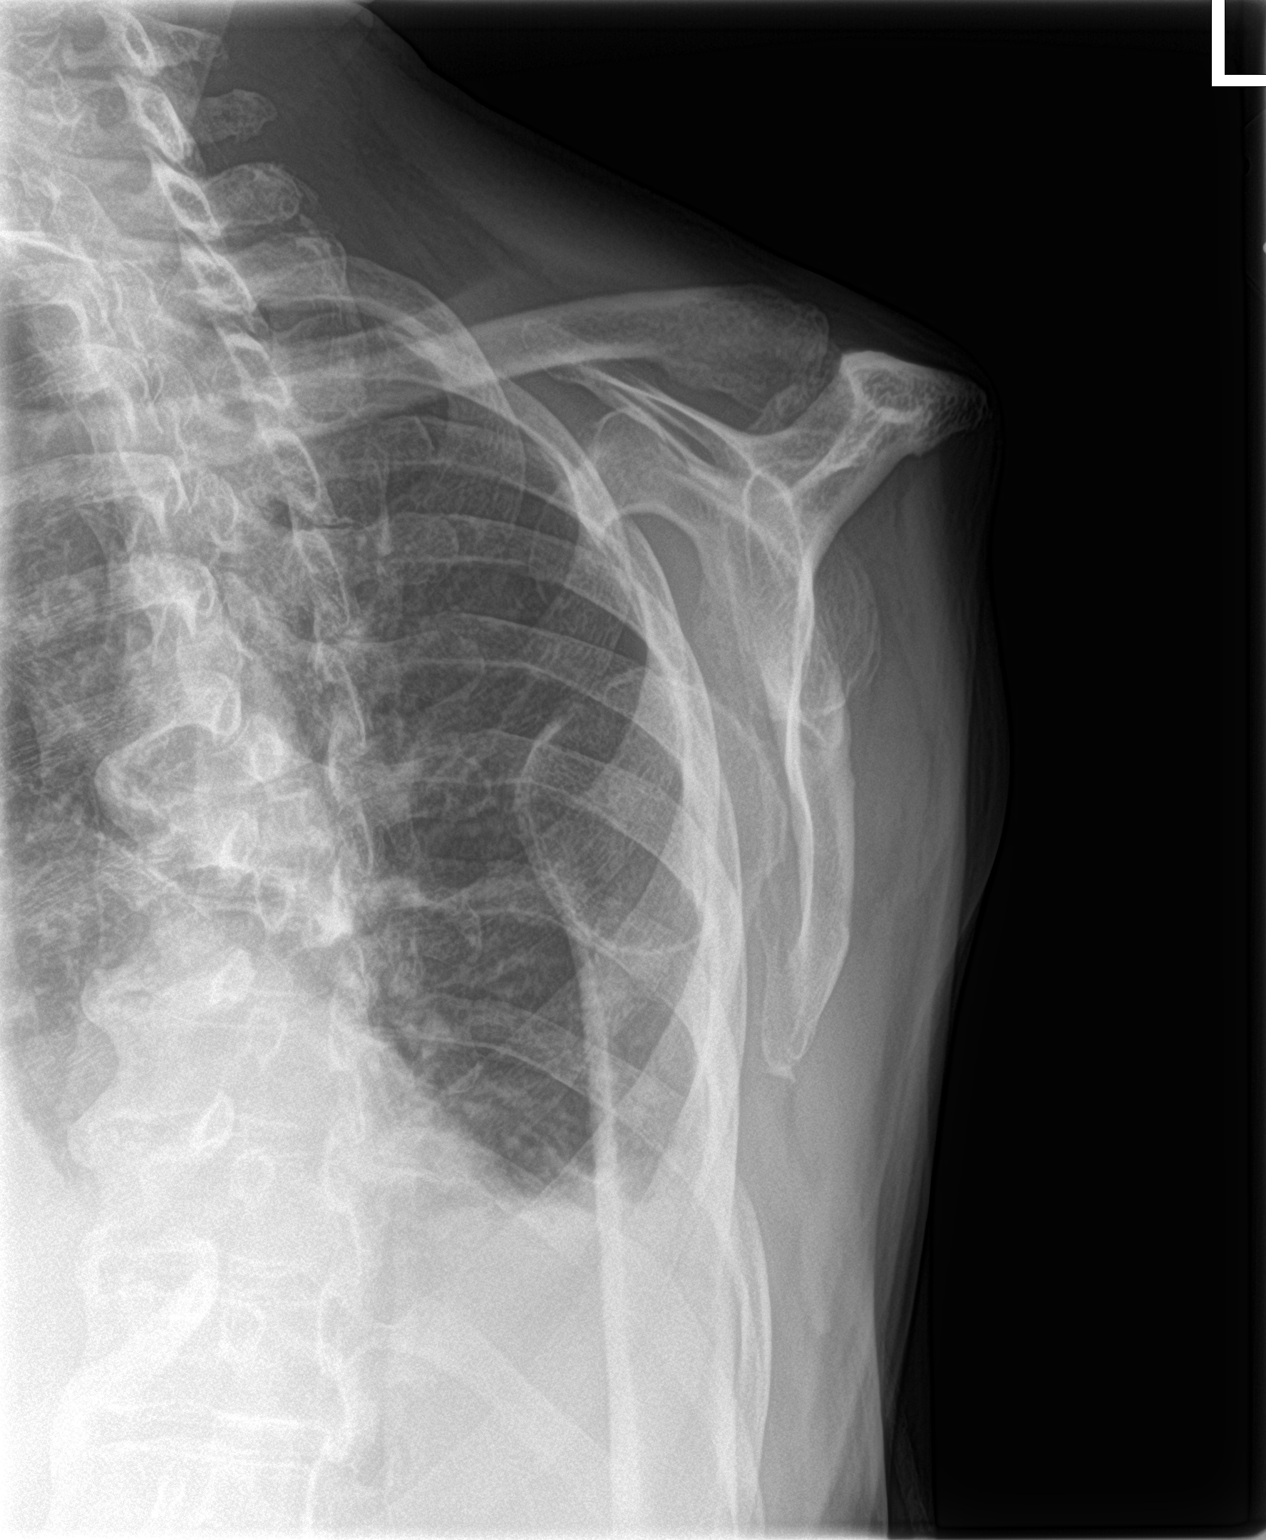

[2 of 2 positions shown; findings below may reference images not displayed]

FINDINGS: Anterior glenohumeral dislocation. No acute fracture identified.
Left clavicle and scapula appear to remain normally aligned. Visible
left ribs appear intact.
IMPRESSION: Anterior left glenohumeral dislocation.  No fracture identified.

## 2020-09-15 MED ORDER — ONDANSETRON HCL 4 MG/2ML IJ SOLN
4.0000 mg | Freq: Once | INTRAMUSCULAR | Status: AC
  Administered 2020-09-15: 4 mg via INTRAVENOUS
  Filled 2020-09-15: qty 2

## 2020-09-15 MED ORDER — HYDROMORPHONE HCL 1 MG/ML IJ SOLN
1.0000 mg | Freq: Once | INTRAMUSCULAR | Status: AC
  Administered 2020-09-15: 1 mg via INTRAMUSCULAR
  Filled 2020-09-15: qty 1

## 2020-09-15 MED ORDER — LACTATED RINGERS IV BOLUS: 500.0000 mL | Freq: Once | INTRAVENOUS | Status: DC

## 2020-09-15 MED ORDER — PROPOFOL 10 MG/ML IV BOLUS
1.0000 mg/kg | Freq: Once | INTRAVENOUS | Status: AC
  Administered 2020-09-15: 30 mg via INTRAVENOUS
  Filled 2020-09-15: qty 20

## 2020-09-15 MED ORDER — ACETAMINOPHEN 500 MG PO TABS
1000.0000 mg | ORAL_TABLET | Freq: Once | ORAL | Status: DC
  Filled 2020-09-15: qty 2

## 2020-09-15 NOTE — ED Triage Notes (Signed)
Pt comes into the ED via [facility] from home c/o fall this morning while watering her flowers.  Pt has obvious deformity to left shoulder.  EMS attempted 3 IV's with no success.  37mcg intranasally given in route.  Pt still c/o pain at level 8.  Pt A&Ox4, 170/80.  Pt reported some kind of "blood cancer" to EMS.

## 2020-09-15 NOTE — Sedation Documentation (Signed)
Xray called for post reduction scan

## 2020-09-15 NOTE — Sedation Documentation (Signed)
Procedure finished. 

## 2020-09-15 NOTE — ED Provider Notes (Signed)
[facility] Emergency Department Provider Note  ____________________________________________   Event Date/Time   First MD Initiated Contact with Patient 09/15/20 0930     (approximate)  I have reviewed the triage vital signs and the nursing notes.   HISTORY  Chief Complaint Fall   HPI Nancy Rodriguez is a 75 y.o. female with a past medical history of anemia of CKD, DM, HDL, RA, osteoarthritis and MGUS who presents via EMS from home for assessment of acute onset of left shoulder pain and development of deformity after she tripped while walking outside this morning falling onto her left shoulder.  She denies hitting her head or any other associated pain including headache, neck pain, bilateral elbow or wrist pain or pain in her right shoulder legs or anywhere else.  She has not had any recent fevers, cough, vomiting, diarrhea, dysuria, rash, chest pain or other recent falls.  She did receive 50 mcg of fentanyl intranasal via EMS prior to arrival and 1 mg of Dilaudid IM while in triage.  He does not think these medicines help that much with her left shoulder pain.  She denies any other acute complaints at this time.  She is sure she did not pass out or feel lightheaded or dizzy or otherwise experience presyncopal symptoms prior to falling.         Past Medical History:  Diagnosis Date  . Anemia    Anemia in chronic kidney disease  . Chronic kidney disease    Stage 3b chronic kidney disease  . Diabetes mellitus without complication ([place])   . Hypercholesterolemia   . Hypertension   . MGUS (monoclonal gammopathy of unknown significance)   . Osteoarthritis   . Rheumatoid arthritis [facility])     Patient Active Problem List   Diagnosis Date Noted  . Anasarca 09/04/2020  . Edema 08/13/2020  . Multiple myeloma ([place]) 08/13/2020  . Chemotherapy induced nausea and vomiting 07/27/2020  . Hypokalemia 07/27/2020  . Goals of care, counseling/discussion 07/20/2020   . Microcytic anemia 07/06/2020  . 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[place]) 07/06/2020  . Abnormal EKG 07/01/2020  . Essential hypertension 07/01/2020  . Hyperlipidemia associated with type 2 diabetes mellitus ([place]) 07/01/2020  . Encounter for antineoplastic chemotherapy 06/17/2020  . Transaminitis 06/17/2020  . Hyperglycemia 06/17/2020  . Amyloidosis ([place]) 05/20/2020  . 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    Past Surgical History:  Procedure Laterality Date  . HERNIA REPAIR    . PARATHYROIDECTOMY      Prior to Admission medications   Medication Sig Start Date End Date Taking? Authorizing Provider  acyclovir (ZOVIRAX) 400 MG tablet Take 1 tablet (400 mg total) by mouth 2 (two) times daily. 06/21/20   [name], MD  albuterol (VENTOLIN HFA) 108 (90 Base) MCG/ACT inhaler Inhale 2 puffs into the lungs every 4 (four) hours as needed. 06/21/15   [provider]  amLODipine (NORVASC) 5 MG tablet Take 5 mg by mouth daily. 11/26/19 11/25/20  [provider]  cyclophosphamide (CYTOXAN) 50 MG capsule TAKE 10 CAPSULES (500 MG TOTAL) BY MOUTH ONCE A WEEK. TAKE WITH BREAKFAST. 06/22/20 06/22/21  [name], MD  docusate sodium (COLACE) 100 MG capsule Take 1 capsule by mouth as needed.    [provider]  EPINEPHrine 0.3 mg/0.3 mL IJ SOAJ injection  05/06/09   [provider]  ferrous sulfate 325 (65 FE) MG tablet Take 1 tablet by mouth daily.    [provider]  furosemide (LASIX) 20 MG tablet Take 2 tablets (40 mg total) by mouth daily. 09/02/20   End, [name], MD  hydrOXYzine (ATARAX/VISTARIL) 10 MG tablet Take 10 mg by mouth 2 (two) times daily as needed. 08/26/20   [provider]  insulin glargine (LANTUS SOLOSTAR) 100 UNIT/ML Solostar Pen Inject 12 Units into the skin 2 (two) times daily. Pt takes in morning and bedtime. 05/17/20 05/17/21  [provider]  LORazepam (ATIVAN) 0.5 MG tablet Take 1 tablet (0.5 mg  total) by mouth every 6 (six) hours as needed (nausea vomiting). 07/20/20   [name], MD  megestrol (MEGACE) 400 MG/10ML suspension Take 10 mLs (400 mg total) by mouth daily. 09/14/20   [name], MD  metoprolol succinate (TOPROL-XL) 50 MG 24 hr tablet TAKE 1 TABLET (50 MG TOTAL) BY MOUTH DAILY IN THE MORNING 11/01/14   [provider]  mirtazapine (REMERON) 7.5 MG tablet Take 1 tablet (7.5 mg total) by mouth at bedtime. 06/21/20   Borders, Kirt Boys, NP  omeprazole (PRILOSEC) 20 MG capsule Take 1 capsule (20 mg total) by mouth daily. 08/24/20   [name], MD  ondansetron (ZOFRAN) 8 MG tablet Take 8 mg by mouth 30 to 60 min prior to Cytoxan administration then take 8 mg twice daily as needed for nausea and vomiting. 08/17/20   [name], MD  potassium chloride (KLOR-CON) 10 MEQ tablet Take 2 tablets (20 mEq total) by mouth daily. 09/14/20   [name], MD  promethazine (PHENERGAN) 25 MG tablet TAKE 1 TABLET BY MOUTH EVERY 8 HOURS AS NEEDED FOR NAUSEA OR VOMITING. 09/08/20   [name], MD  traMADol (ULTRAM) 50 MG tablet Take 1 tablet (50 mg total) by mouth every 6 (six) hours as needed for severe pain. 06/06/20   [name], MD    Allergies Benazepril, Lisinopril, Tolmetin, and Nsaids  Family History  Problem Relation Age of Onset  . Diabetes Daughter   . Diabetes Son   . Dementia Mother   . Cancer Father   . Esophageal cancer Sister   . Brain cancer Brother     Social History Social History   Tobacco Use  . Smoking status: Never Smoker  . Smokeless tobacco: Never Used  Vaping Use  . Vaping Use: Never used  Substance Use Topics  . Alcohol use: No  . Drug use: Never    Review of Systems  Review of Systems  Constitutional: Negative for chills and fever.  HENT: Negative for sore throat.   Eyes: Negative for pain.  Respiratory: Negative for cough and stridor.   Cardiovascular: Negative for chest pain.  Gastrointestinal: Negative for vomiting.  Genitourinary: Negative for dysuria.   Musculoskeletal: Positive for joint pain ( L shoulder) and myalgias ( L shoulder).  Skin: Negative for rash.  Neurological: Negative for seizures, loss of consciousness and headaches.  Psychiatric/Behavioral: Negative for suicidal ideas.  All other systems reviewed and are negative.     ____________________________________________   PHYSICAL EXAM:  VITAL SIGNS: ED Triage Vitals  Enc Vitals Group     BP 09/15/20 0853 (!) 180/79     Pulse Rate 09/15/20 0853 (!) 109     Resp 09/15/20 0853 18     Temp 09/15/20 0853 97.7 F (36.5 C)     Temp Source 09/15/20 0853 Oral     SpO2 09/15/20 0853 96 %     Weight 09/15/20 0854 132 lb (59.9 kg)     Height 09/15/20 0854 $RemoveBefor'5\' 3"'[id number]$  (1.6 m)  Head Circumference --      Peak Flow --      Pain Score 09/15/20 0852 10     Pain Loc --      Pain Edu? --      Excl. in [place]? --    Vitals:   09/15/20 1100 09/15/20 1115  BP: (!) 181/85 (!) 172/77  Pulse: (!) 105 92  Resp: 14 17  Temp:    SpO2: 98% 100%   Physical Exam Vitals and nursing note reviewed.  Constitutional:      General: She is not in acute distress.    Appearance: She is well-developed.  HENT:     Head: Normocephalic and atraumatic.     Right Ear: External ear normal.     Left Ear: External ear normal.     Nose: Nose normal.  Eyes:     Conjunctiva/sclera: Conjunctivae normal.  Cardiovascular:     Rate and Rhythm: Normal rate and regular rhythm.     Heart sounds: No murmur heard.   Pulmonary:     Effort: Pulmonary effort is normal. No respiratory distress.     Breath sounds: Normal breath sounds.  Abdominal:     Palpations: Abdomen is soft.     Tenderness: There is no abdominal tenderness.  Musculoskeletal:     Cervical back: Neck supple.  Skin:    General: Skin is warm and dry.  Neurological:     Mental Status: She is alert and oriented to person, place, and time.  Psychiatric:        Mood and Affect: Mood normal.     Deformities of left shoulder with inability  for patient to abduct no range of motion.  2+ radial pulse.  Sensation intact in the distribution of ulnar radial and median nerves in the bilateral upper extremities.  No tenderness step-offs or deformities over the C/T/L-spine.  No obvious obvious deformities, effusion, tenderness or other evidence of trauma to the right upper extremity distal left upper extremity or bilateral lower extremities.  Hips are unremarkable. ____________________________________________   LABS (all labs ordered are listed, but only abnormal results are displayed)  Labs Reviewed - No data to display ____________________________________________  EKG  ____________________________________________  RADIOLOGY  ED MD interpretation: Plain film of the left shoulder remarkable for no clear fracture there is an anterior dislocation.  Post reduction x-ray shows successful reduction without fracture identified.  Official radiology report(s): DG Shoulder Left  Result Date: 09/15/2020 CLINICAL DATA:  75 year old female with anterior left shoulder dislocation after fall. Post reduction. EXAM: LEFT SHOULDER - 2+ VIEW COMPARISON:  0907 hours today. FINDINGS: Normal glenohumeral joint alignment now. Proximal left humerus and scapula appear intact. Mild degenerative changes at the left [facility] joint. Left clavicle and visible left ribs appear intact. IMPRESSION: Reduction of the left shoulder dislocation with no fracture identified. Electronically Signed   By: Genevie Ann M.D.   On: 09/15/2020 11:11   DG Shoulder Left  Result Date: 09/15/2020 CLINICAL DATA:  75 year old female status post fall and pain. EXAM: LEFT SHOULDER - 2+ VIEW COMPARISON:  Left shoulder series 11/30/2014. FINDINGS: Anterior glenohumeral dislocation. No acute fracture identified. Left clavicle and scapula appear to remain normally aligned. Visible left ribs appear intact. IMPRESSION: Anterior left glenohumeral dislocation.  No fracture identified. Electronically Signed    By: Genevie Ann M.D.   On: 09/15/2020 09:25    ____________________________________________   PROCEDURES  Procedure(s) performed (including Critical Care):  .Sedation  Date/Time: 09/15/2020 9:47 AM Performed by: Tamala Julian,  Ida Rogue, MD Authorized by: Lucrezia Starch, MD   Consent:    Consent obtained:  Verbal   Consent given by:  Patient   Risks discussed:  Allergic reaction, dysrhythmia, inadequate sedation, nausea, vomiting, respiratory compromise necessitating ventilatory assistance and intubation, prolonged sedation necessitating reversal and prolonged hypoxia resulting in organ damage   Alternatives discussed:  Analgesia without sedation Universal protocol:    Immediately prior to procedure, a time out was called: yes   Indications:    Procedure performed:  Dislocation reduction   Procedure necessitating sedation performed by:  Physician performing sedation Pre-sedation assessment:    Time since last food or drink:  N/a    NPO status caution: urgency dictates proceeding with non-ideal NPO status     ASA classification: class 3 - patient with severe systemic disease     Mallampati score:  I - soft palate, uvula, fauces, pillars visible   Pre-sedation assessments completed and reviewed: airway patency, cardiovascular function, hydration status, mental status, nausea/vomiting, pain level, respiratory function and temperature   Immediate pre-procedure details:    Reviewed: vital signs, relevant labs/tests and NPO status     Verified: bag valve mask available, emergency equipment available, intubation equipment available, IV patency confirmed, oxygen available, reversal medications available and suction available   Procedure details (see MAR for exact dosages):    Preoxygenation:  Nasal cannula   Sedation:  Propofol   Intended level of sedation: deep   Analgesia:  Hydromorphone   Intra-procedure monitoring:  Blood pressure monitoring, cardiac monitor, continuous pulse oximetry,  continuous capnometry, frequent LOC assessments and frequent vital sign checks   Intra-procedure events: none     Total Provider sedation time (minutes):  15 Post-procedure details:    Attendance: Constant attendance by certified staff until patient recovered     Recovery: Patient returned to pre-procedure baseline     Post-sedation assessments completed and reviewed: airway patency, cardiovascular function, hydration status, mental status, nausea/vomiting, pain level, respiratory function and temperature     Patient is stable for discharge or admission: yes     Procedure completion:  Tolerated well, no immediate complications Reduction of dislocation  Date/Time: 09/15/2020 9:59 AM Performed by: Lucrezia Starch, MD Authorized by: Lucrezia Starch, MD  Consent: Verbal consent obtained. Written consent obtained. Risks and benefits: risks, benefits and alternatives were discussed Consent given by: patient Patient understanding: patient states understanding of the procedure being performed Patient identity confirmed: verbally with patient, arm band and provided demographic data Local anesthesia used: no  Anesthesia: Local anesthesia used: no  Sedation: Patient sedated: yes Sedatives: propofol  Patient tolerance: patient tolerated the procedure well with no immediate complications      ____________________________________________   INITIAL IMPRESSION / ASSESSMENT AND PLAN / ED COURSE      Patient presents with above-stated history exam for assessment of acute onset of pain and deformity left shoulder after she tripped falling onto her left shoulder.  She denies any other associated sick symptoms.  On arrival she is slight tachycardic at 109, hypertensive at 180/79 with stable vital signs on room air.  She does have deformity weakness of the left shoulder but is neurovascular intact distally.  No other obvious evidence of injuries on exam.  She denies hitting her head or any LOC is  no neck pain or other findings concerning for occult intracranial or cervical injury this time.  Plain film of the left shoulder shows dislocation without fracture.  Dislocation reduced under propofol sedation per procedure  note above.  Postreduction plain films show successful reduction without residual fracture.  Patient placed in sling.  Given some Tylenol and discharged stable condition with instruction to follow-up with PCP in next couple of days.    ____________________________________________   FINAL CLINICAL IMPRESSION(S) / ED DIAGNOSES  Final diagnoses:  Anterior dislocation of left shoulder, initial encounter    Medications  acetaminophen (TYLENOL) tablet 1,000 mg (has no administration in time range)  HYDROmorphone (DILAUDID) injection 1 mg (1 mg Intramuscular Given 09/15/20 0902)  propofol (DIPRIVAN) 10 mg/mL bolus/IV push 59.9 mg (30 mg Intravenous Given 09/15/20 1044)  ondansetron (ZOFRAN) injection 4 mg (4 mg Intravenous Given 09/15/20 1035)     ED Discharge Orders    None       Note:  This document was prepared using Dragon voice recognition software and may include unintentional dictation errors.   Lucrezia Starch, MD 09/15/20 [phone]

## 2020-09-15 NOTE — Sedation Documentation (Signed)
Left shoulder reduction started

## 2020-09-15 NOTE — ED Triage Notes (Signed)
Pt states she was watering her flowers and she tripped over the sidewalk- pt had deformity to left shoulder- pt feet swollen but pt states she has taken her lasix

## 2020-09-16 ENCOUNTER — Ambulatory Visit: Payer: Self-pay | Admitting: *Deleted

## 2020-09-16 NOTE — Telephone Encounter (Signed)
Per initial encounter, "Patient daughter Nancy Rodriguez called in with her concerned she was seen at the ER for a dislocated shoulder and it was replaced and a cast was put on but this morning she woke up with numbness in some of her fingers and they are concerned"; contacted pt and her daughter to discuss symptoms; the pt has ? Numbness and  stiffness in left index finger started 09/15/20 around 1800; they are not able to fully describe her symptoms; recommendations made per nurse triage protocol; reiterated that it is better to ere on the side of caution and seek evaluation on the ED; they verbalize understanding;they will consult her PCP for follow up will go the ED.  Reason for Disposition . [1] Numbness (i.e., loss of sensation) of the face, arm / hand, or leg / foot on one side of the body AND [2] sudden onset AND [3] brief (now gone)  Protocols used: NEUROLOGIC DEFICIT-A-AH

## 2020-09-20 ENCOUNTER — Other Ambulatory Visit: Payer: Medicare Other

## 2020-09-21 ENCOUNTER — Inpatient Hospital Stay (HOSPITAL_BASED_OUTPATIENT_CLINIC_OR_DEPARTMENT_OTHER): Payer: Medicare Other | Admitting: Internal Medicine

## 2020-09-21 ENCOUNTER — Other Ambulatory Visit: Payer: Self-pay

## 2020-09-21 ENCOUNTER — Ambulatory Visit
Admission: RE | Admit: 2020-09-21 | Discharge: 2020-09-21 | Disposition: A | Discharge status: Home / Self Care | Payer: Medicare Other | Source: Ambulatory Visit | Attending: Internal Medicine | Admitting: Internal Medicine

## 2020-09-21 ENCOUNTER — Inpatient Hospital Stay: Payer: Medicare Other

## 2020-09-21 ENCOUNTER — Encounter: Payer: Self-pay | Admitting: Internal Medicine

## 2020-09-21 VITALS — BP 117/58 | HR 85 | Temp 98.4°F | Resp 14 | Wt 134.6 lb

## 2020-09-21 DIAGNOSIS — E8581 Light chain (AL) amyloidosis: Secondary | ICD-10-CM | POA: Diagnosis not present

## 2020-09-21 DIAGNOSIS — E859 Amyloidosis, unspecified: Secondary | ICD-10-CM

## 2020-09-21 DIAGNOSIS — M545 Low back pain, unspecified: Secondary | ICD-10-CM | POA: Insufficient documentation

## 2020-09-21 DIAGNOSIS — Z5111 Encounter for antineoplastic chemotherapy: Secondary | ICD-10-CM | POA: Diagnosis not present

## 2020-09-21 LAB — CBC WITH DIFFERENTIAL/PLATELET
Abs Immature Granulocytes: 0.09 10*3/uL — ABNORMAL HIGH (ref 0.00–0.07)
Basophils Absolute: 0 10*3/uL (ref 0.0–0.1)
Basophils Relative: 0 %
Eosinophils Absolute: 0.1 10*3/uL (ref 0.0–0.5)
Eosinophils Relative: 1 %
HCT: 22.9 % — ABNORMAL LOW (ref 36.0–46.0)
Hemoglobin: 7.8 g/dL — ABNORMAL LOW (ref 12.0–15.0)
Immature Granulocytes: 1 %
Lymphocytes Relative: 19 %
Lymphs Abs: 1.6 10*3/uL (ref 0.7–4.0)
MCH: 25.6 pg — ABNORMAL LOW (ref 26.0–34.0)
MCHC: 34.1 g/dL (ref 30.0–36.0)
MCV: 75.1 fL — ABNORMAL LOW (ref 80.0–100.0)
Monocytes Absolute: 0.8 10*3/uL (ref 0.1–1.0)
Monocytes Relative: 9 %
Neutro Abs: 5.6 10*3/uL (ref 1.7–7.7)
Neutrophils Relative %: 70 %
Platelets: 305 10*3/uL (ref 150–400)
RBC: 3.05 MIL/uL — ABNORMAL LOW (ref 3.87–5.11)
RDW: 25.7 % — ABNORMAL HIGH (ref 11.5–15.5)
WBC: 8.1 10*3/uL (ref 4.0–10.5)
nRBC: 1.4 % — ABNORMAL HIGH (ref 0.0–0.2)

## 2020-09-21 LAB — COMPREHENSIVE METABOLIC PANEL
ALT: 40 U/L (ref 0–44)
AST: 55 U/L — ABNORMAL HIGH (ref 15–41)
Albumin: 2.2 g/dL — ABNORMAL LOW (ref 3.5–5.0)
Alkaline Phosphatase: 1311 U/L — ABNORMAL HIGH (ref 38–126)
Anion gap: 11 (ref 5–15)
BUN: 26 mg/dL — ABNORMAL HIGH (ref 8–23)
CO2: 25 mmol/L (ref 22–32)
Calcium: 8.5 mg/dL — ABNORMAL LOW (ref 8.9–10.3)
Chloride: 99 mmol/L (ref 98–111)
Creatinine, Ser: 1.38 mg/dL — ABNORMAL HIGH (ref 0.44–1.00)
GFR, Estimated: 40 mL/min — ABNORMAL LOW (ref >60)
Glucose, Bld: 213 mg/dL — ABNORMAL HIGH (ref 70–99)
Potassium: 3 mmol/L — ABNORMAL LOW (ref 3.5–5.1)
Sodium: 135 mmol/L (ref 135–145)
Total Bilirubin: 2.8 mg/dL — ABNORMAL HIGH (ref 0.3–1.2)
Total Protein: 5.5 g/dL — ABNORMAL LOW (ref 6.5–8.1)

## 2020-09-21 IMAGING — CR DG LUMBAR SPINE COMPLETE 4+V
5 series · 5 of 5 positions shown · non-contrast
Comparison: None.

CLINICAL DATA: Acute low back pain.

EXAM:
LUMBAR SPINE - COMPLETE 4+ VIEW

[l-spine ap]
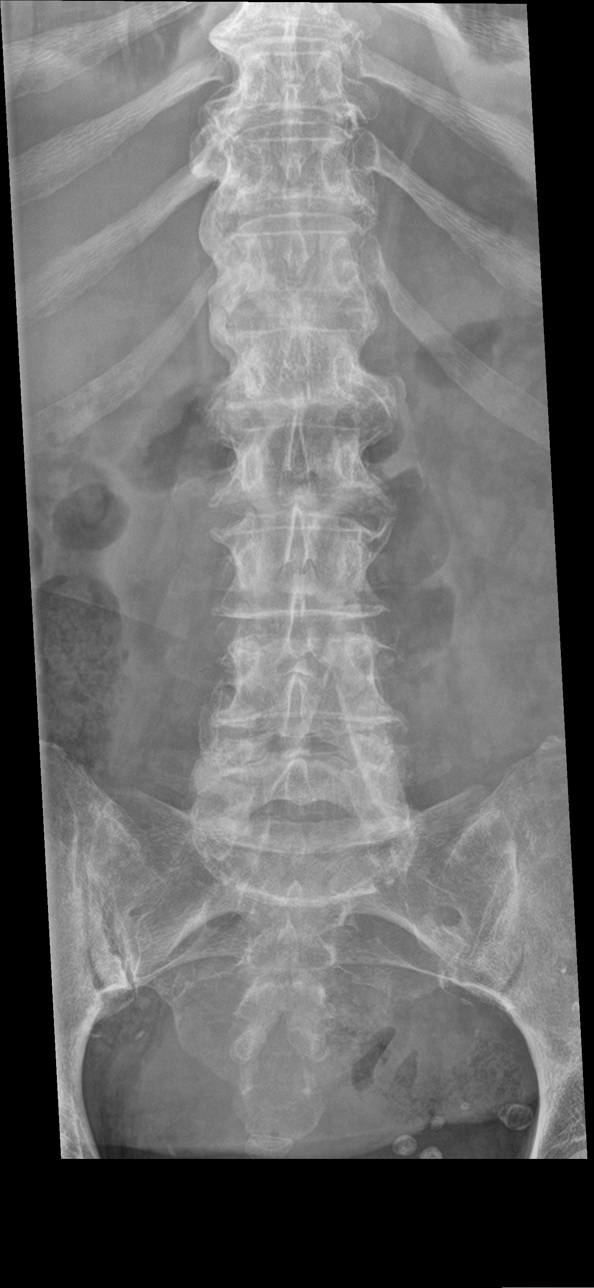

[l-spine obl (1 of 2)]
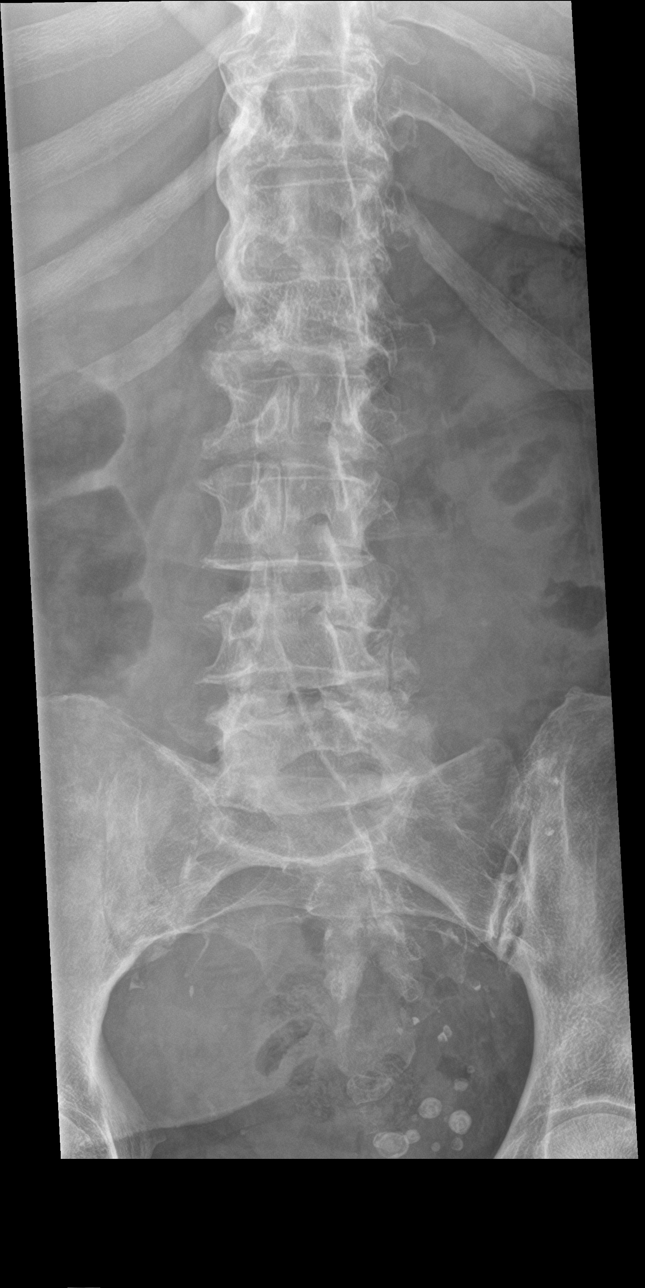

[l-spine obl (2 of 2)]
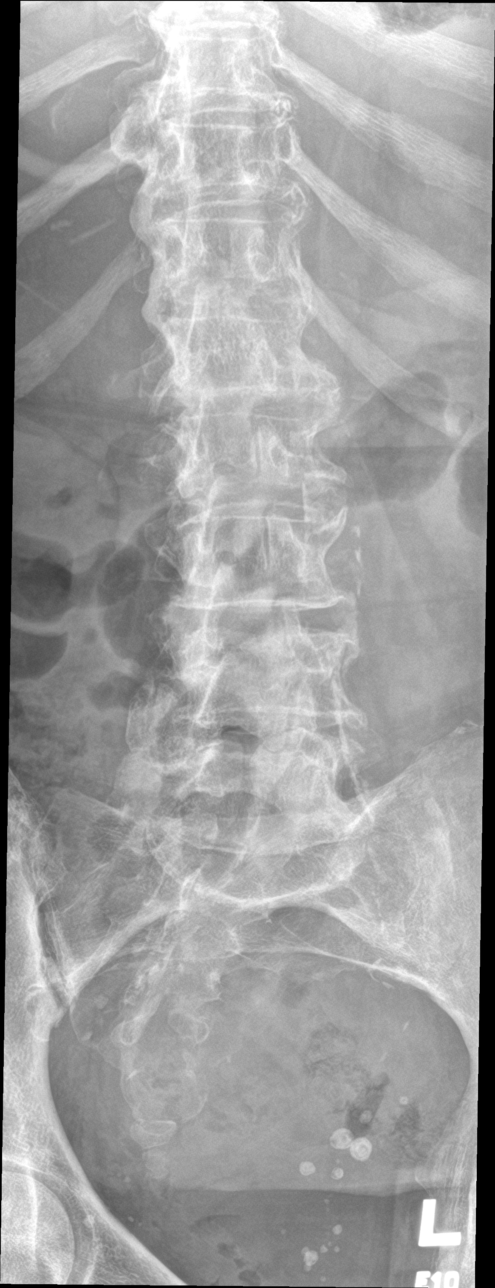

[l-spine lat]
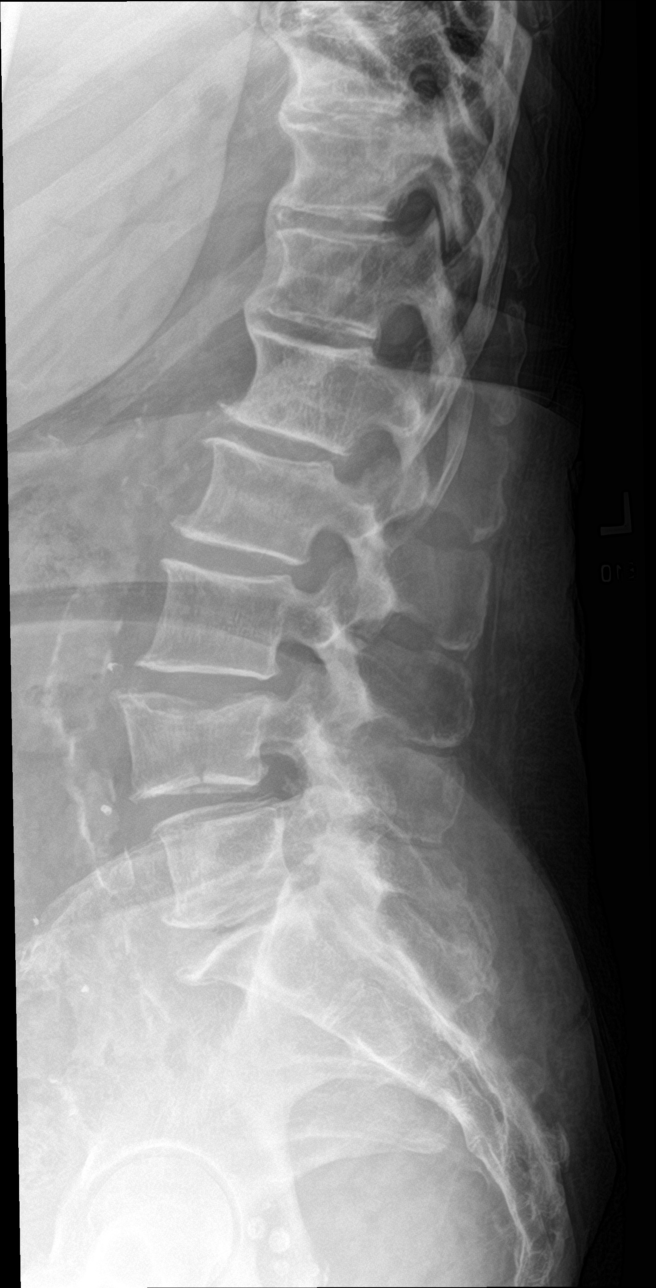

[l-spine spot]
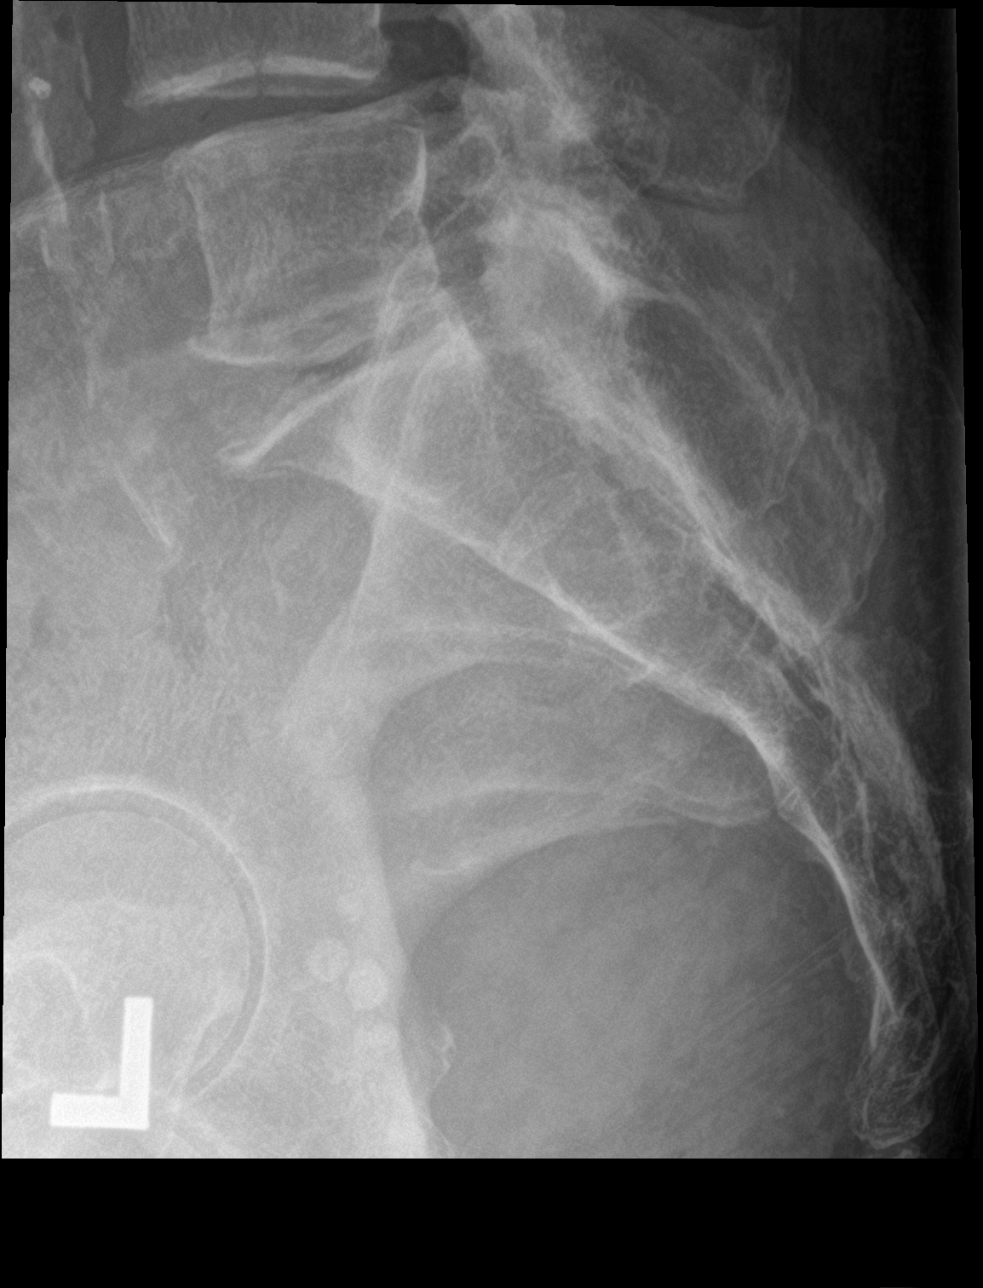

[5 of 5 positions shown; findings below may reference images not displayed]

FINDINGS: Mildly displaced fracture is seen involving the L4 vertebral body.
Mild grade 1 anterolisthesis of L4-5 is noted secondary to posterior
facet joint hypertrophy. Mild degenerative disc disease is noted at
L1-2, L2-3 and L5-S1 with anterior osteophyte formation.
IMPRESSION: Mildly displaced acute fracture is seen involving the L4 vertebral
body. These results will be called to the ordering clinician or
representative by the Radiologist Assistant, and communication
documented in the PACS or zVision Dashboard.

Aortic Atherosclerosis ([UT]-[UT]).

## 2020-09-21 NOTE — Progress Notes (Signed)
[place] NOTE  Patient Care Team: Leonel Ramsay, MD as PCP - General (Infectious Diseases)  CHIEF COMPLAINTS/PURPOSE OF CONSULTATION: Amyloidosis-AL  #  Oncology History   No history exists.     HISTORY OF PRESENTING ILLNESS:  Nancy Rodriguez 75 y.o.  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  In the interim unfortunately patient fell after tripping.  She dislocated her left shoulder for which she was evaluated in the emergency room.  Patient is currently wearing a sling/awaiting Ortho appointment next week.  Patient denies hitting her head.  No loss of consciousness.  No dizziness.  Patient complains of extreme fatigue.  She has shortness of breath with minimal exertion.  Otherwise denies any blood in stools or black or stools.  She complains of shoulder pain back pain.  Chronic nausea from chemotherapy.  Otherwise not any worse.   Review of Systems  Constitutional: Positive for malaise/fatigue and weight loss. Negative for chills, diaphoresis and fever.  HENT: Negative for nosebleeds and sore throat.   Eyes: Negative for double vision.  Respiratory: Positive for shortness of breath. Negative for cough, hemoptysis, sputum production and wheezing.   Cardiovascular: Negative for chest pain, palpitations, orthopnea and leg swelling.  Gastrointestinal: Positive for nausea. Negative for abdominal pain, blood in stool, constipation, diarrhea, heartburn, melena and vomiting.  Genitourinary: Negative for dysuria, frequency and urgency.  Musculoskeletal: Positive for back pain, falls and joint pain.  Skin: Negative.  Negative for itching and rash.  Neurological: Negative for dizziness, tingling, focal weakness, weakness and headaches.  Endo/Heme/Allergies: Does not bruise/bleed easily.  Psychiatric/Behavioral: Negative for depression. The patient is not nervous/anxious and does  not have insomnia.      MEDICAL HISTORY:  Past Medical History:  Diagnosis Date  . Anemia    Anemia in chronic kidney disease  . Chronic kidney disease    Stage 3b chronic kidney disease  . Diabetes mellitus without complication ([place])   . Hypercholesterolemia   . Hypertension   . MGUS (monoclonal gammopathy of unknown significance)   . Osteoarthritis   . Rheumatoid arthritis ([place])     SURGICAL HISTORY: Past Surgical History:  Procedure Laterality Date  . HERNIA REPAIR    . PARATHYROIDECTOMY      SOCIAL HISTORY: Social History   Socioeconomic History  . Marital status: Widowed    Spouse name: Not on file  . Number of children: 6  . Years of education: Not on file  . Highest education level: Not on file  Occupational History  . Not on file  Tobacco Use  . Smoking status: Never Smoker  . Smokeless tobacco: Never Used  Vaping Use  . Vaping Use: Never used  Substance and Sexual Activity  . Alcohol use: No  . Drug use: Never  . Sexual activity: Not on file  Other Topics Concern  . Not on file  Social History Narrative  . Not on file   Social Determinants of Health   Financial Resource Strain: Not on file  Food Insecurity: Not on file  Transportation Needs: Not on file  Physical Activity: Not on file  Stress: Not on file  Social Connections: Not on file  Intimate Partner Violence: Not on file    FAMILY HISTORY: Family History  Problem Relation Age of Onset  . Diabetes Daughter   . Diabetes Son   . Dementia Mother   . Cancer Father   . Esophageal cancer  Sister   . Brain cancer Brother     ALLERGIES:  is allergic to benazepril, lisinopril, tolmetin, and nsaids.  MEDICATIONS:  Current Outpatient Medications  Medication Sig Dispense Refill  . acyclovir (ZOVIRAX) 400 MG tablet Take 1 tablet (400 mg total) by mouth 2 (two) times daily. 60 tablet 5  . albuterol (VENTOLIN HFA) 108 (90 Base) MCG/ACT inhaler Inhale 2 puffs into the lungs every 4 (four)  hours as needed.    Marland Kitchen amLODipine (NORVASC) 5 MG tablet Take 5 mg by mouth daily.    . cloNIDine (CATAPRES) 0.1 MG tablet Take by mouth.    . cyclophosphamide (CYTOXAN) 50 MG capsule TAKE 10 CAPSULES (500 MG TOTAL) BY MOUTH ONCE A WEEK. TAKE WITH BREAKFAST. 40 capsule 3  . docusate sodium (COLACE) 100 MG capsule Take 1 capsule by mouth as needed.    Marland Kitchen EPINEPHrine 0.3 mg/0.3 mL IJ SOAJ injection     . ferrous sulfate 325 (65 FE) MG tablet Take 1 tablet by mouth daily.    . furosemide (LASIX) 20 MG tablet Take 2 tablets (40 mg total) by mouth daily. 60 tablet 1  . hydrOXYzine (ATARAX/VISTARIL) 10 MG tablet Take 10 mg by mouth 2 (two) times daily as needed.    . insulin glargine (LANTUS SOLOSTAR) 100 UNIT/ML Solostar Pen Inject 12 Units into the skin 2 (two) times daily. Pt takes in morning and bedtime.    Marland Kitchen LORazepam (ATIVAN) 0.5 MG tablet Take 1 tablet (0.5 mg total) by mouth every 6 (six) hours as needed (nausea vomiting). 30 tablet 0  . megestrol (MEGACE) 400 MG/10ML suspension Take 10 mLs (400 mg total) by mouth daily. 240 mL 0  . metoprolol succinate (TOPROL-XL) 50 MG 24 hr tablet TAKE 1 TABLET (50 MG TOTAL) BY MOUTH DAILY IN THE MORNING    . mirtazapine (REMERON) 7.5 MG tablet Take 1 tablet (7.5 mg total) by mouth at bedtime. 30 tablet 2  . omeprazole (PRILOSEC) 20 MG capsule Take 1 capsule (20 mg total) by mouth daily. 30 capsule 1  . ondansetron (ZOFRAN) 8 MG tablet Take 8 mg by mouth 30 to 60 min prior to Cytoxan administration then take 8 mg twice daily as needed for nausea and vomiting. 30 tablet 1  . potassium chloride (KLOR-CON) 10 MEQ tablet Take 2 tablets (20 mEq total) by mouth daily. 60 tablet 1  . promethazine (PHENERGAN) 25 MG tablet TAKE 1 TABLET BY MOUTH EVERY 8 HOURS AS NEEDED FOR NAUSEA OR VOMITING. 90 tablet 1  . traMADol (ULTRAM) 50 MG tablet Take 1 tablet (50 mg total) by mouth every 6 (six) hours as needed for severe pain. 8 tablet 0   No current facility-administered  medications for this visit.      Marland Kitchen  PHYSICAL EXAMINATION: ECOG PERFORMANCE STATUS: 2 - Symptomatic, <50% confined to bed  Vitals:   09/21/20 1118  BP: (!) 117/58  Pulse: 85  Resp: 14  Temp: 98.4 F (36.9 C)  SpO2: 96%   Filed Weights   09/21/20 1118  Weight: 134 lb 9.6 oz (61.1 kg)    Physical Exam Constitutional:      Comments: Nancy Rodriguez female patient in a wheelchair.  She is alone.  HENT:     Head: Normocephalic and atraumatic.     Mouth/Throat:     Pharynx: No oropharyngeal exudate.  Eyes:     Pupils: Pupils are equal, round, and reactive to light.  Cardiovascular:     Rate and Rhythm: Normal rate and regular  rhythm.  Pulmonary:     Effort: No respiratory distress.     Breath sounds: No wheezing.     Comments: Decreased air entry bilaterally at the bases. Abdominal:     General: Bowel sounds are normal. There is no distension.     Palpations: Abdomen is soft. There is no mass.     Tenderness: There is no abdominal tenderness. There is no guarding or rebound.  Musculoskeletal:        General: No tenderness. Normal range of motion.     Cervical back: Normal range of motion and neck supple.  Skin:    General: Skin is warm.  Neurological:     Mental Status: She is alert and oriented to person, place, and time.  Psychiatric:        Mood and Affect: Affect normal.      LABORATORY DATA:  I have reviewed the data as listed Lab Results  Component Value Date   WBC 8.1 09/21/2020   HGB 7.8 (L) 09/21/2020   HCT 22.9 (L) 09/21/2020   MCV 75.1 (L) 09/21/2020   PLT 305 09/21/2020   Recent Labs    09/07/20 0822 09/14/20 0908 09/21/20 1104  NA 134* 138 135  K 3.7 3.3* 3.0*  CL 96* 102 99  CO2 26 26 25   GLUCOSE 243* 159* 213*  BUN 19 20 26*  CREATININE 1.23* 1.17* 1.38*  CALCIUM 8.5* 8.6* 8.5*  GFRNONAA 46* 48* 40*  PROT 5.5* 5.4* 5.5*  ALBUMIN 2.1* 2.1* 2.2*  AST 65* 69* 55*  ALT 43 41 40  ALKPHOS 1,204* 1,306* 1,311*  BILITOT 3.7* 3.3*  2.8*    RADIOGRAPHIC STUDIES: I have personally reviewed the radiological images as listed and agreed with the findings in the report. CT ABDOMEN PELVIS WO CONTRAST  Result Date: 09/02/2020 CLINICAL DATA:  Light chain amyloidosis. Weight loss. Abdominal distension. EXAM: CT ABDOMEN AND PELVIS WITHOUT CONTRAST TECHNIQUE: Multidetector CT imaging of the abdomen and pelvis was performed following the standard protocol without IV contrast. COMPARISON:  MRI abdomen 04/07/2020 FINDINGS: Lower chest: Lung bases are clear. Hepatobiliary: No focal hepatic lesion on noncontrast exam. Gallstones present in a collapsed gallbladder. Caudate lobe is prominent. Fluid along the RIGHT hepatic margin. Benign cyst in LEFT hepatic lobe. Pancreas: Pancreas is normal. No ductal dilatation. No pancreatic inflammation. Spleen: Normal spleen Adrenals/urinary tract: Adrenal glands normal. No nephrolithiasis ureterolithiasis. No obstructive uropathy. The density of the urine in the bladder is greater than typical (HU equal 27). No IV contrast administered. Stomach/Bowel: Stomach, small bowel, appendix, and cecum are normal. The colon and rectosigmoid colon are normal. Vascular/Lymphatic: Abdominal aorta is normal caliber with atherosclerotic calcification. There is no retroperitoneal or periportal lymphadenopathy. No pelvic lymphadenopathy. Reproductive: Uterus and adnexa unremarkable. Other: Moderate volume of free fluid along the margin liver and collecting in the pelvis. Focal of fluid simple fluid attenuation without organization. Musculoskeletal: No aggressive osseous lesion. IMPRESSION: 1. High-density urine within the bladder. Recommend urinalysis and correlation for cystitis or hematuria. 2. Moderate volume of free fluid in the RIGHT abdomen and pelvis is favored ascites. Recommend correlation with liver function. 3. Cholelithiasis without evidence cholecystitis. These results will be called to the ordering clinician or  representative by the Radiologist Assistant, and communication documented in the PACS or [employer]. Electronically Signed   By: Suzy Bouchard M.D.   On: 09/02/2020 09:25   US Abdomen Limited  Result Date: 08/25/2020 CLINICAL DATA:  Ascites. EXAM: LIMITED ABDOMEN ULTRASOUND FOR ASCITES TECHNIQUE: Limited  ultrasound survey for ascites was performed in all four abdominal quadrants. COMPARISON:  None. FINDINGS: 4 quadrant assessment of the abdomen/pelvis demonstrates no intraperitoneal free fluid. Small bilateral pleural effusions noted incidentally. IMPRESSION: No sonographic evidence of ascites. Electronically Signed   By: Misty Stanley M.D.   On: 08/25/2020 09:32   DG Shoulder Left  Result Date: 09/15/2020 CLINICAL DATA:  75 year old female with anterior left shoulder dislocation after fall. Post reduction. EXAM: LEFT SHOULDER - 2+ VIEW COMPARISON:  0907 hours today. FINDINGS: Normal glenohumeral joint alignment now. Proximal left humerus and scapula appear intact. Mild degenerative changes at the left [facility] joint. Left clavicle and visible left ribs appear intact. IMPRESSION: Reduction of the left shoulder dislocation with no fracture identified. Electronically Signed   By: Genevie Ann M.D.   On: 09/15/2020 11:11   DG Shoulder Left  Result Date: 09/15/2020 CLINICAL DATA:  75 year old female status post fall and pain. EXAM: LEFT SHOULDER - 2+ VIEW COMPARISON:  Left shoulder series 11/30/2014. FINDINGS: Anterior glenohumeral dislocation. No acute fracture identified. Left clavicle and scapula appear to remain normally aligned. Visible left ribs appear intact. IMPRESSION: Anterior left glenohumeral dislocation.  No fracture identified. Electronically Signed   By: Genevie Ann M.D.   On: 09/15/2020 09:25    ASSESSMENT & PLAN:   Amyloidosis ([place])  # AL-amyloidosis Diagnosis of AL amyloidosis was discussed with patient.  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  Discussed regarding PRBC transfusion; patient declines for now.  Hold tube in 1 week; discussed regarding IV Venofer-patient interested./Scheduled ordered for next week.  For now recommend taking oral iron twice a day.  #Chemotherapy-induced nausea, likely secondary to the oral cyclophosphamide.  Continue Emend and Aloxi as premed; continue home meds stable.  #Hypokalemia, potassium level 3.0.  Recommend potassium chloride 20 mEq daily.  Overall stable  #Chronic kidney disease stage III, probably AL involvement follow-up with nephrology-GFR slightly worse at 40 today.  Increase hydration.  #Anxiety, recommend patient to take Remeron-unclear if patient is following the recommendation.  #Unintentional weight loss, likely due to poor oral intake.  Discussed with patient about starting appetite stimulant.  On Megace stable.  #Bilateral lower extremity swelling, neg DVT.  Cardiology prescribed Lasix 20 mg daily, continue compression stocking.  # Tripped/ S/p fall- dislocated left shoulder [ER]-ortho on June 14th. [ER]  #Ongoing low back pain worse-recommend x-rays of the lumbar spine especially given recent fall.  MRI January no obvious concerns of malignancy noted; except for atypical hemangioma.  #The above plan of care was discussed with the patient/and her son [name] over the phone.  All questions answered.  # DISPOSITION: # HOLD treatment today # as planned follow up in 1 week-Dr.Yu; ADD HOLD tube; possible Venofer-Dr.B    All questions were answered. The patient knows to call the clinic with any problems, questions or concerns.       Cammie Sickle, MD 09/21/2020 2:37 PM

## 2020-09-21 NOTE — Progress Notes (Signed)
Pt c/o back pain 9/10. Dislocated her shoulder on June 2nd, watering flowers tripped on the driveway, fell and dislocated her shoulder.

## 2020-09-21 NOTE — Patient Instructions (Signed)
X-rays Today

## 2020-09-21 NOTE — Assessment & Plan Note (Addendum)
#   AL-amyloidosis Diagnosis of AL amyloidosis was discussed with patient.  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  Discussed regarding PRBC transfusion; patient declines for now.  Hold tube in 1 week; discussed regarding IV Venofer-patient interested./Scheduled ordered for next week.  For now recommend taking oral iron twice a day.  #Chemotherapy-induced nausea, likely secondary to the oral cyclophosphamide.  Continue Emend and Aloxi as premed; continue home meds stable.  #Hypokalemia, potassium level 3.0.  Recommend potassium chloride 20 mEq daily.  Overall stable  #Chronic kidney disease stage III, probably AL involvement follow-up with nephrology-GFR slightly worse at 40 today.  Increase hydration.  #Anxiety, recommend patient to take Remeron-unclear if patient is following the recommendation.  #Unintentional weight loss, likely due to poor oral intake.  Discussed with patient about starting appetite stimulant.  On Megace stable.  #Bilateral lower extremity swelling, neg DVT.  Cardiology prescribed Lasix 20 mg daily, continue compression stocking.  # Tripped/ S/p fall- dislocated left shoulder [ER]-ortho on June 14th. [ER]  #Ongoing low back pain worse-recommend x-rays of the lumbar spine especially given recent fall.  MRI January no obvious concerns of malignancy noted; except for atypical hemangioma.  #The above plan of care was discussed with the patient/and her son [name] over the phone.  All questions answered.  # DISPOSITION: # HOLD treatment today # as planned follow up in 1 week-Dr.Yu; ADD HOLD tube; possible Venofer-Dr.B

## 2020-09-22 ENCOUNTER — Ambulatory Visit: Payer: Medicare Other | Admitting: Oncology

## 2020-09-23 ENCOUNTER — Telehealth: Payer: Self-pay | Admitting: *Deleted

## 2020-09-23 NOTE — Telephone Encounter (Signed)
CAlled report  Narrative & Impression  CLINICAL DATA:  Acute low back pain.   EXAM: LUMBAR SPINE - COMPLETE 4+ VIEW   COMPARISON:  None.   FINDINGS: Mildly displaced fracture is seen involving the L4 vertebral body. Mild grade 1 anterolisthesis of L4-5 is noted secondary to posterior facet joint hypertrophy. Mild degenerative disc disease is noted at L1-2, L2-3 and L5-S1 with anterior osteophyte formation.   IMPRESSION: Mildly displaced acute fracture is seen involving the L4 vertebral body. These results will be called to the ordering clinician or representative by the Radiologist Assistant, and communication documented in the PACS or [employer].   Aortic Atherosclerosis (ICD10-I70.0).     Electronically Signed   By: Marijo Conception M.D.   On: 09/23/2020 14:44

## 2020-09-28 ENCOUNTER — Inpatient Hospital Stay: Payer: Medicare Other

## 2020-09-28 ENCOUNTER — Encounter: Payer: Self-pay | Admitting: Oncology

## 2020-09-28 ENCOUNTER — Telehealth: Payer: Self-pay | Admitting: Internal Medicine

## 2020-09-28 ENCOUNTER — Inpatient Hospital Stay (HOSPITAL_BASED_OUTPATIENT_CLINIC_OR_DEPARTMENT_OTHER): Payer: Medicare Other | Admitting: Oncology

## 2020-09-28 ENCOUNTER — Other Ambulatory Visit: Payer: Self-pay

## 2020-09-28 VITALS — BP 140/75 | HR 93 | Resp 16

## 2020-09-28 VITALS — BP 138/67 | HR 93 | Temp 97.7°F | Ht 63.0 in | Wt 137.1 lb

## 2020-09-28 DIAGNOSIS — T451X5A Adverse effect of antineoplastic and immunosuppressive drugs, initial encounter: Secondary | ICD-10-CM

## 2020-09-28 DIAGNOSIS — E859 Amyloidosis, unspecified: Secondary | ICD-10-CM

## 2020-09-28 DIAGNOSIS — E8581 Light chain (AL) amyloidosis: Secondary | ICD-10-CM

## 2020-09-28 DIAGNOSIS — D631 Anemia in chronic kidney disease: Secondary | ICD-10-CM

## 2020-09-28 DIAGNOSIS — R7989 Other specified abnormal findings of blood chemistry: Secondary | ICD-10-CM

## 2020-09-28 DIAGNOSIS — Z5111 Encounter for antineoplastic chemotherapy: Secondary | ICD-10-CM | POA: Diagnosis not present

## 2020-09-28 DIAGNOSIS — M545 Low back pain, unspecified: Secondary | ICD-10-CM

## 2020-09-28 DIAGNOSIS — R634 Abnormal weight loss: Secondary | ICD-10-CM | POA: Diagnosis not present

## 2020-09-28 DIAGNOSIS — R112 Nausea with vomiting, unspecified: Secondary | ICD-10-CM

## 2020-09-28 DIAGNOSIS — N1831 Chronic kidney disease, stage 3a: Secondary | ICD-10-CM

## 2020-09-28 LAB — CBC WITH DIFFERENTIAL/PLATELET
Abs Immature Granulocytes: 0.1 10*3/uL — ABNORMAL HIGH (ref 0.00–0.07)
Basophils Absolute: 0 10*3/uL (ref 0.0–0.1)
Basophils Relative: 0 %
Eosinophils Absolute: 0.1 10*3/uL (ref 0.0–0.5)
Eosinophils Relative: 1 %
HCT: 25.5 % — ABNORMAL LOW (ref 36.0–46.0)
Hemoglobin: 8.8 g/dL — ABNORMAL LOW (ref 12.0–15.0)
Immature Granulocytes: 1 %
Lymphocytes Relative: 21 %
Lymphs Abs: 1.7 10*3/uL (ref 0.7–4.0)
MCH: 25.9 pg — ABNORMAL LOW (ref 26.0–34.0)
MCHC: 34.5 g/dL (ref 30.0–36.0)
MCV: 75 fL — ABNORMAL LOW (ref 80.0–100.0)
Monocytes Absolute: 0.6 10*3/uL (ref 0.1–1.0)
Monocytes Relative: 7 %
Neutro Abs: 5.3 10*3/uL (ref 1.7–7.7)
Neutrophils Relative %: 70 %
Platelets: 344 10*3/uL (ref 150–400)
RBC: 3.4 MIL/uL — ABNORMAL LOW (ref 3.87–5.11)
RDW: 25.5 % — ABNORMAL HIGH (ref 11.5–15.5)
WBC: 7.8 10*3/uL (ref 4.0–10.5)
nRBC: 2.1 % — ABNORMAL HIGH (ref 0.0–0.2)

## 2020-09-28 LAB — SAMPLE TO BLOOD BANK

## 2020-09-28 LAB — COMPREHENSIVE METABOLIC PANEL
ALT: 38 U/L (ref 0–44)
AST: 56 U/L — ABNORMAL HIGH (ref 15–41)
Albumin: 2.3 g/dL — ABNORMAL LOW (ref 3.5–5.0)
Alkaline Phosphatase: 1229 U/L — ABNORMAL HIGH (ref 38–126)
Anion gap: 9 (ref 5–15)
BUN: 25 mg/dL — ABNORMAL HIGH (ref 8–23)
CO2: 24 mmol/L (ref 22–32)
Calcium: 8.9 mg/dL (ref 8.9–10.3)
Chloride: 101 mmol/L (ref 98–111)
Creatinine, Ser: 1.3 mg/dL — ABNORMAL HIGH (ref 0.44–1.00)
GFR, Estimated: 43 mL/min — ABNORMAL LOW (ref >60)
Glucose, Bld: 418 mg/dL — ABNORMAL HIGH (ref 70–99)
Potassium: 3.1 mmol/L — ABNORMAL LOW (ref 3.5–5.1)
Sodium: 134 mmol/L — ABNORMAL LOW (ref 135–145)
Total Bilirubin: 3.9 mg/dL — ABNORMAL HIGH (ref 0.3–1.2)
Total Protein: 5.9 g/dL — ABNORMAL LOW (ref 6.5–8.1)

## 2020-09-28 MED ORDER — SODIUM CHLORIDE 0.9 % IV SOLN
Freq: Once | INTRAVENOUS | Status: AC
  Filled 2020-09-28: qty 250

## 2020-09-28 MED ORDER — POTASSIUM CHLORIDE CRYS ER 20 MEQ PO TBCR: 20.0000 meq | EXTENDED_RELEASE_TABLET | Freq: Two times a day (BID) | ORAL | 1 refills | Status: DC

## 2020-09-28 MED ORDER — ACETAMINOPHEN 325 MG PO TABS
325.0000 mg | ORAL_TABLET | Freq: Once | ORAL | Status: AC
  Administered 2020-09-28: 325 mg via ORAL
  Filled 2020-09-28: qty 1

## 2020-09-28 MED ORDER — PALONOSETRON HCL INJECTION 0.25 MG/5ML
0.2500 mg | Freq: Once | INTRAVENOUS | Status: AC
Start: 2020-09-28 — End: 2020-09-28
  Administered 2020-09-28: 0.25 mg via INTRAVENOUS
  Filled 2020-09-28: qty 5

## 2020-09-28 MED ORDER — DIPHENHYDRAMINE HCL 25 MG PO CAPS
50.0000 mg | ORAL_CAPSULE | Freq: Once | ORAL | Status: AC
  Administered 2020-09-28: 50 mg via ORAL
  Filled 2020-09-28: qty 2

## 2020-09-28 MED ORDER — FERROUS SULFATE 325 (65 FE) MG PO TABS: 325.0000 mg | ORAL_TABLET | Freq: Two times a day (BID) | ORAL | 2 refills | Status: DC

## 2020-09-28 MED ORDER — DEXAMETHASONE 4 MG PO TABS
20.0000 mg | ORAL_TABLET | Freq: Once | ORAL | Status: AC
Start: 2020-09-28 — End: 2020-09-28
  Administered 2020-09-28: 20 mg via ORAL
  Filled 2020-09-28: qty 5

## 2020-09-28 MED ORDER — BORTEZOMIB CHEMO SQ INJECTION 3.5 MG (2.5MG/ML)
0.5000 mg/m2 | Freq: Once | INTRAMUSCULAR | Status: AC
  Administered 2020-09-28: 0.75 mg via SUBCUTANEOUS
  Filled 2020-09-28: qty 0.3

## 2020-09-28 MED ORDER — FOSAPREPITANT DIMEGLUMINE INJECTION 150 MG
150.0000 mg | Freq: Once | INTRAVENOUS | Status: AC
  Administered 2020-09-28: 150 mg via INTRAVENOUS
  Filled 2020-09-28: qty 150

## 2020-09-28 MED ORDER — DARATUMUMAB-HYALURONIDASE-FIHJ 1800-30000 MG-UT/15ML ~~LOC~~ SOLN
1800.0000 mg | Freq: Once | SUBCUTANEOUS | Status: AC
  Administered 2020-09-28: 1800 mg via SUBCUTANEOUS
  Filled 2020-09-28: qty 15

## 2020-09-28 NOTE — Progress Notes (Signed)
Hematology/Oncology  Follow up note [facility] [phone] [phone]   Patient Care Team: Fitzgerald, David P, MD as PCP - General (Infectious Diseases)  REFERRING PROVIDER: Fitzgerald, David P, MD  CHIEF COMPLAINTS/REASON FOR VISIT:  Follow-up for amyloidosis  HISTORY OF PRESENTING ILLNESS:   Nancy Rodriguez is a  75 y.o.  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  Work up include protein electrophoresis showed M protein 0.7,   Reviewed her previous medical records via care everywhere.  She was seen by Hematology Oncology at [facility] on 02/14/2017.  07/25/2007  SPEP showed M protein of 0.35, IFE showed IgG lamda 09/22/2007  Bone survey negative.  02/15/14 IgG 930, SPEP M protien 0.22, free kappa light chain 3.59, lamda 2.92, ratio 1.23  Hypercalcemia, resolved after stopping HCTZ.  # Transaminitis 03/28/20 US abdomen showed left liver lobe Complex 1.7 cm cyst  # 04/07/20 MRI abdomen w/wo contrast showed 2 benign liver cysts, 2 nonspecific hypovascular irighr liver lesions, abnormal bone marrow signal of L1 vertebral body. Patient was  advised to proceed with bone marrow biopsy and she declined.  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  Patient changed her mind and ask a biopsy to be canceled.  My team and I had multiple phone discussion with patient's daughter Nancy Rodriguez and later with the patient's son Nancy Rodriguez.  Patient agreed with bone marrow biopsy and a biopsy was scheduled on 06/09/2020.  05/20/20 NT proBNP  was normal,  TSH slightly increased, normal T4 Normal factor X Normal coags Troponin 19. Refer to  cardiology for evaluation. May need cardiac MRI  # 06/01/2020 2D echo result was reviewed and discussed with patient.  Patient has normal LVEF 60-65%. Mild asymmetric left ventricular hypertrophy.  Left ventricular diastolic parameters are indeterminate. average left ventricular global longitudinal strain is -16.8 %.  # 06/09/2020, bone marrow biopsy showed monoclonal plasmacytosis, 8%, amyloid deposit present.  Absent iron stores. 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 t (14;20) #06/17/2020, further discussed about diagnosis and treatment plan.  Patient declined bone marrow transplant evaluation.  Decision was made to proceed with Dara-CyborD chemotherapy treatments. #May 2022, second opinion at [facility] with Dr. Kang.who agrees with current treatment plan.  He recommends to lower dexamethasone to 20 mg and decrease premed Tylenol to 325 mg  09/01/2020 cardiac MRI was performed at [facility].  Left ventricle is normal in size.  Mild to moderate basal septal hypertrophy.  Hyper dynamic LV function.  LVEF 71%.  Right ventricle is normal in size and wall thickness.  Systolic function normal.  Mild bi atrial enlargement, no significant aortic valve stenosis or regurgitation.  Trivial mitral regurgitation, mild tricuspid regurgitation and a trivial pulmonic regurgitation.  No evidence of MI, scarring or infiltration.  #History of major depression listed in outside problem list.  previous diagnosis and treatment details are not available to me in EMR  I have referred her to psychiatrist  INTERVAL HISTORY Nancy Rodriguez is a 75 y.o. female who has above history reviewed by me today presents for follow up visit for management of AL amyloidosis Problems and complaints   are listed below: Patient presents for the evaluation prior to Dara-CyborD treatment. Patient was seen by covering provider last week and chemotherapy was held due to profound weakness and fatigue and  drop of hemoglobin to 7.8.  She was recommended to increase ferrous sulfate 325 mg to 2 times daily. Today she feels better.  Nausea is controlled. She is currently on Megace for appetite stimulant and appetite has improved. It is unclear if she is taking Remeron as recommended by both me and nephrology. She has gained 3 pounds since last visit.  She is currently on Lasix and potassium chloride supplementation.  Review of Systems  Constitutional:  Positive for fatigue. Negative for appetite change, chills, fever and unexpected weight change.  HENT:   Negative for hearing loss, nosebleeds and voice change.   Eyes:  Negative for eye problems.  Respiratory:  Negative for chest tightness and cough.   Cardiovascular:  Negative for chest pain and leg swelling.  Gastrointestinal:  Negative for abdominal distention, abdominal pain, blood in stool and nausea.  Endocrine: Negative for hot flashes.  Genitourinary:  Negative for difficulty urinating and frequency.   Musculoskeletal:  Negative for arthralgias.  Skin:  Negative for rash.  Neurological:  Negative for extremity weakness and numbness.  Hematological:  Negative for adenopathy.  Psychiatric/Behavioral:  Negative for confusion.    MEDICAL HISTORY:  Past Medical History:  Diagnosis Date   Anemia    Anemia in chronic kidney disease   Chronic kidney disease    Stage 3b chronic kidney disease   Diabetes mellitus without complication (HCC)    Hypercholesterolemia    Hypertension    MGUS (monoclonal gammopathy of unknown significance)    Osteoarthritis    Rheumatoid arthritis ([place])     SURGICAL HISTORY: Past Surgical History:  Procedure Laterality Date   HERNIA REPAIR     PARATHYROIDECTOMY      SOCIAL HISTORY: Social History   Socioeconomic History   Marital status: Widowed    Spouse name: Not on file   Number of children: 6   Years of education: Not on file   Highest education level: Not on file  Occupational History    Not on file  Tobacco Use   Smoking status: Never   Smokeless tobacco: Never  Vaping Use   Vaping Use: Never used  Substance and Sexual Activity   Alcohol use: No   Drug use: Never   Sexual activity: Not on file  Other Topics Concern   Not on file  Social History Narrative   Not on file   Social Determinants of Health   Financial Resource Strain: Not on file  Food Insecurity: Not on file  Transportation Needs: Not on file  Physical Activity: Not on file  Stress: Not on file  Social Connections: Not on file  Intimate Partner Violence: Not on file    FAMILY HISTORY: Family History  Problem Relation Age of Onset   Diabetes Daughter    Diabetes Son    Dementia Mother    Cancer Father    Esophageal cancer Sister    Brain cancer Brother     ALLERGIES:  is allergic to benazepril, lisinopril, tolmetin, and nsaids.  MEDICATIONS:  Current Outpatient Medications  Medication Sig Dispense Refill   acyclovir (ZOVIRAX) 400 MG tablet Take 1 tablet (400 mg total) by mouth 2 (two) times daily. 60 tablet 5   albuterol (VENTOLIN HFA) 108 (90 Base) MCG/ACT inhaler Inhale 2 puffs into the lungs every 4 (four) hours as  needed.     amLODipine (NORVASC) 5 MG tablet Take 5 mg by mouth daily.     cloNIDine (CATAPRES) 0.1 MG tablet Take by mouth.     cyclophosphamide (CYTOXAN) 50 MG capsule TAKE 10 CAPSULES (500 MG TOTAL) BY MOUTH ONCE A WEEK. TAKE WITH BREAKFAST. 40 capsule 3   docusate sodium (COLACE) 100 MG capsule Take 1 capsule by mouth as needed.     EPINEPHrine 0.3 mg/0.3 mL IJ SOAJ injection      furosemide (LASIX) 20 MG tablet Take 2 tablets (40 mg total) by mouth daily. 60 tablet 1   hydrOXYzine (ATARAX/VISTARIL) 10 MG tablet Take 10 mg by mouth 2 (two) times daily as needed.     insulin glargine (LANTUS SOLOSTAR) 100 UNIT/ML Solostar Pen Inject 12 Units into the skin 2 (two) times daily. Pt takes in morning and bedtime.     LORazepam (ATIVAN) 0.5 MG tablet Take 1 tablet (0.5 mg  total) by mouth every 6 (six) hours as needed (nausea vomiting). 30 tablet 0   megestrol (MEGACE) 400 MG/10ML suspension Take 10 mLs (400 mg total) by mouth daily. 240 mL 0   metoprolol succinate (TOPROL-XL) 50 MG 24 hr tablet TAKE 1 TABLET (50 MG TOTAL) BY MOUTH DAILY IN THE MORNING     mirtazapine (REMERON) 7.5 MG tablet Take 1 tablet (7.5 mg total) by mouth at bedtime. 30 tablet 2   omeprazole (PRILOSEC) 20 MG capsule Take 1 capsule (20 mg total) by mouth daily. 30 capsule 1   ondansetron (ZOFRAN) 8 MG tablet Take 8 mg by mouth 30 to 60 min prior to Cytoxan administration then take 8 mg twice daily as needed for nausea and vomiting. 30 tablet 1   potassium chloride SA (KLOR-CON) 20 MEQ tablet Take 1 tablet (20 mEq total) by mouth 2 (two) times daily. 60 tablet 1   promethazine (PHENERGAN) 25 MG tablet TAKE 1 TABLET BY MOUTH EVERY 8 HOURS AS NEEDED FOR NAUSEA OR VOMITING. 90 tablet 1   traMADol (ULTRAM) 50 MG tablet Take 1 tablet (50 mg total) by mouth every 6 (six) hours as needed for severe pain. 8 tablet 0   ferrous sulfate 325 (65 FE) MG tablet Take 1 tablet (325 mg total) by mouth 2 (two) times daily with a meal. 60 tablet 2   No current facility-administered medications for this visit.     PHYSICAL EXAMINATION: ECOG PERFORMANCE STATUS: 1 - Symptomatic but completely ambulatory Vitals:   09/28/20 1114  BP: 138/67  Pulse: 93  Temp: 97.7 F (36.5 C)  SpO2: 99%   Filed Weights   09/28/20 1114  Weight: 137 lb 1.6 oz (62.2 kg)    Physical Exam Constitutional:      General: She is not in acute distress. HENT:     Head: Normocephalic and atraumatic.  Eyes:     General: No scleral icterus. Cardiovascular:     Rate and Rhythm: Normal rate and regular rhythm.     Heart sounds: Murmur heard.  Pulmonary:     Effort: Pulmonary effort is normal. No respiratory distress.     Breath sounds: No wheezing.  Abdominal:     General: Bowel sounds are normal. There is no distension.      Palpations: Abdomen is soft.  Musculoskeletal:        General: Swelling present. No deformity. Normal range of motion.     Cervical back: Normal range of motion and neck supple.     Comments: Bilateral lower extremity 1+  edema  Skin:    General: Skin is warm and dry.     Findings: No erythema or rash.  Neurological:     Mental Status: She is alert and oriented to person, place, and time. Mental status is at baseline.     Cranial Nerves: No cranial nerve deficit.     Coordination: Coordination normal.  Psychiatric:        Mood and Affect: Mood normal.    LABORATORY DATA:  I have reviewed the data as listed Lab Results  Component Value Date   WBC 7.8 09/28/2020   HGB 8.8 (L) 09/28/2020   HCT 25.5 (L) 09/28/2020   MCV 75.0 (L) 09/28/2020   PLT 344 09/28/2020   Recent Labs    09/14/20 0908 09/21/20 1104 09/28/20 1049  NA 138 135 134*  K 3.3* 3.0* 3.1*  CL 102 99 101  CO2 $Re'26 25 24  'Ngi$ GLUCOSE 159* 213* 418*  BUN 20 26* 25*  CREATININE 1.17* 1.38* 1.30*  CALCIUM 8.6* 8.5* 8.9  GFRNONAA 48* 40* 43*  PROT 5.4* 5.5* 5.9*  ALBUMIN 2.1* 2.2* 2.3*  AST 69* 55* 56*  ALT 41 40 38  ALKPHOS 1,306* 1,311* 1,229*  BILITOT 3.3* 2.8* 3.9*    Iron/TIBC/Ferritin/ %Sat    Component Value Date/Time   IRON 35 08/31/2020 1037   TIBC 290 08/31/2020 1037   FERRITIN 82 08/31/2020 1037   IRONPCTSAT 12 08/31/2020 1037     08/25/2019, platelet count 491, WBC 7.5, hemoglobin 12 Creatinine 1.58, EGFR 37, calcium 10.8, albumin 4.2 Negative hepatitis B surface antigen, hepatitis B core antibody, Negative hepatitis C 08/05/2019, A1c 11.2   RADIOGRAPHIC STUDIES: I have personally reviewed the radiological images as listed and agreed with the findings in the report. CT ABDOMEN PELVIS WO CONTRAST  Result Date: 09/02/2020 CLINICAL DATA:  Light chain amyloidosis. Weight loss. Abdominal distension. EXAM: CT ABDOMEN AND PELVIS WITHOUT CONTRAST TECHNIQUE: Multidetector CT imaging of the abdomen and  pelvis was performed following the standard protocol without IV contrast. COMPARISON:  MRI abdomen 04/07/2020 FINDINGS: Lower chest: Lung bases are clear. Hepatobiliary: No focal hepatic lesion on noncontrast exam. Gallstones present in a collapsed gallbladder. Caudate lobe is prominent. Fluid along the RIGHT hepatic margin. Benign cyst in LEFT hepatic lobe. Pancreas: Pancreas is normal. No ductal dilatation. No pancreatic inflammation. Spleen: Normal spleen Adrenals/urinary tract: Adrenal glands normal. No nephrolithiasis ureterolithiasis. No obstructive uropathy. The density of the urine in the bladder is greater than typical (HU equal 27). No IV contrast administered. Stomach/Bowel: Stomach, small bowel, appendix, and cecum are normal. The colon and rectosigmoid colon are normal. Vascular/Lymphatic: Abdominal aorta is normal caliber with atherosclerotic calcification. There is no retroperitoneal or periportal lymphadenopathy. No pelvic lymphadenopathy. Reproductive: Uterus and adnexa unremarkable. Other: Moderate volume of free fluid along the margin liver and collecting in the pelvis. Focal of fluid simple fluid attenuation without organization. Musculoskeletal: No aggressive osseous lesion. IMPRESSION: 1. High-density urine within the bladder. Recommend urinalysis and correlation for cystitis or hematuria. 2. Moderate volume of free fluid in the RIGHT abdomen and pelvis is favored ascites. Recommend correlation with liver function. 3. Cholelithiasis without evidence cholecystitis. These results will be called to the ordering clinician or representative by the Radiologist Assistant, and communication documented in the PACS or [employer]. Electronically Signed   By: Suzy Bouchard M.D.   On: 09/02/2020 09:25   DG Lumbar Spine Complete  Result Date: 09/23/2020 CLINICAL DATA:  Acute low back pain. EXAM: LUMBAR SPINE - COMPLETE 4+  VIEW COMPARISON:  None. FINDINGS: Mildly displaced fracture is seen  involving the L4 vertebral body. Mild grade 1 anterolisthesis of L4-5 is noted secondary to posterior facet joint hypertrophy. Mild degenerative disc disease is noted at L1-2, L2-3 and L5-S1 with anterior osteophyte formation. IMPRESSION: Mildly displaced acute fracture is seen involving the L4 vertebral body. These results will be called to the ordering clinician or representative by the Radiologist Assistant, and communication documented in the PACS or [employer]. Aortic Atherosclerosis (ICD10-I70.0). Electronically Signed   By: Marijo Conception M.D.   On: 09/23/2020 14:44   US Abdomen Limited  Result Date: 08/25/2020 CLINICAL DATA:  Ascites. EXAM: LIMITED ABDOMEN ULTRASOUND FOR ASCITES TECHNIQUE: Limited ultrasound survey for ascites was performed in all four abdominal quadrants. COMPARISON:  None. FINDINGS: 4 quadrant assessment of the abdomen/pelvis demonstrates no intraperitoneal free fluid. Small bilateral pleural effusions noted incidentally. IMPRESSION: No sonographic evidence of ascites. Electronically Signed   By: Misty Stanley M.D.   On: 08/25/2020 09:32   US Venous Img Lower Bilateral  Result Date: 07/27/2020 CLINICAL DATA:  Bilateral lower extremity swelling EXAM: BILATERAL LOWER EXTREMITY VENOUS DOPPLER ULTRASOUND TECHNIQUE: Gray-scale sonography with graded compression, as well as color Doppler and duplex ultrasound were performed to evaluate the lower extremity deep venous systems from the level of the common femoral vein and including the common femoral, femoral, profunda femoral, popliteal and calf veins including the posterior tibial, peroneal and gastrocnemius veins when visible. The superficial great saphenous vein was also interrogated. Spectral Doppler was utilized to evaluate flow at rest and with distal augmentation maneuvers in the common femoral, femoral and popliteal veins. COMPARISON:  None. FINDINGS: RIGHT LOWER EXTREMITY Common Femoral Vein: No evidence of thrombus.  Normal compressibility, respiratory phasicity and response to augmentation. Saphenofemoral Junction: No evidence of thrombus. Normal compressibility and flow on color Doppler imaging. Profunda Femoral Vein: No evidence of thrombus. Normal compressibility and flow on color Doppler imaging. Femoral Vein: No evidence of thrombus. Normal compressibility, respiratory phasicity and response to augmentation. Popliteal Vein: No evidence of thrombus. Normal compressibility, respiratory phasicity and response to augmentation. Calf Veins: No evidence of thrombus. Normal compressibility and flow on color Doppler imaging. Other Findings:  Lower extremity subcutaneous edema noted LEFT LOWER EXTREMITY Common Femoral Vein: No evidence of thrombus. Normal compressibility, respiratory phasicity and response to augmentation. Saphenofemoral Junction: No evidence of thrombus. Normal compressibility and flow on color Doppler imaging. Profunda Femoral Vein: No evidence of thrombus. Normal compressibility and flow on color Doppler imaging. Femoral Vein: No evidence of thrombus. Normal compressibility, respiratory phasicity and response to augmentation. Popliteal Vein: No evidence of thrombus. Normal compressibility, respiratory phasicity and response to augmentation. Calf Veins: No evidence of thrombus. Normal compressibility and flow on color Doppler imaging. Other Findings:  Lower extremity subcutaneous edema noted IMPRESSION: No evidence of deep venous thrombosis in either lower extremity. Electronically Signed   By: Jerilynn Mages.  Shick M.D.   On: 07/27/2020 14:28   DG Shoulder Left  Result Date: 09/15/2020 CLINICAL DATA:  75 year old female with anterior left shoulder dislocation after fall. Post reduction. EXAM: LEFT SHOULDER - 2+ VIEW COMPARISON:  0907 hours today. FINDINGS: Normal glenohumeral joint alignment now. Proximal left humerus and scapula appear intact. Mild degenerative changes at the left [facility] joint. Left clavicle and visible left  ribs appear intact. IMPRESSION: Reduction of the left shoulder dislocation with no fracture identified. Electronically Signed   By: Genevie Ann M.D.   On: 09/15/2020 11:11   DG Shoulder Left  Result Date: 09/15/2020 CLINICAL DATA:  75 year old female status post fall and pain. EXAM: LEFT SHOULDER - 2+ VIEW COMPARISON:  Left shoulder series 11/30/2014. FINDINGS: Anterior glenohumeral dislocation. No acute fracture identified. Left clavicle and scapula appear to remain normally aligned. Visible left ribs appear intact. IMPRESSION: Anterior left glenohumeral dislocation.  No fracture identified. Electronically Signed   By: Genevie Ann M.D.   On: 09/15/2020 09:25       ASSESSMENT & PLAN:  1. Light chain (AL) amyloidosis (HCC)   2. Encounter for antineoplastic chemotherapy   3. 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[place])   4. Chemotherapy induced nausea and vomiting   5. Loss of weight   6. Elevated LFTs    # AL-amyloidosis Diagnosis of AL amyloidosis was discussed with patient.  Patient has biopsy confirmed liver involvement, possible kidney involvement- Labs reviewed and discussed with patient. Proceed with cycle 4  Day 1 CyBorD- [Daratumumab is now every 2 weeks] Hyperbilirubinemia and abnormal liver function. Bilirubin 3.9 and recommend patient to take 350 mg cyclophosphamide today..Dose reduced Velcade 0.5 mg/m,   #Chemotherapy-induced nausea, likely secondary to the oral cyclophosphamide.  Continue Emend and Aloxi as premed.  Continue home antiemetic regimen-Zofran, Ativan as instructed. Phenergan 25 mg every 8 hours as needed.  Symptoms are stable  #Hypokalemia, potassium level 3.1.  Recommend patient to increase potassium to 39meq twice daily.Marland Kitchen  #Chronic kidney disease, probably AL involvement follow-up with nephrology.  Stable creatinine.  24-hour urine protein has improved. Follow up with nephrology  #Anemia is multifactorial, from CKD, marrow suppression and iron  deficiency. Microcytic anemia likely due to iron deficiency, continue oral iron supplementation, ferrous sulfate 325 mg twice daily. She may also have underlying hemoglobinopathy due to the chronically decreased MCV.  Discussed about IV Venofer treatments, rationale and potential side effects and patient opted to continue oral iron supplementation for now.  #Anxiety, recommend patient to take Remeron-unclear if patient is following the recommendation. #Unintentional weight loss, symptoms improved after started on Megace.  Continue  #Bilateral lower extremity swelling, neg DVT.  Cardiology prescribed Lasix 20 mg daily, continue compression stocking. #Lack of good IV access, multiple times today for IV, we discussed with patient about Mediport placement in the next visit.  Plan was discussed with patient and daughter.  Also discussed her son [name] over the phone. Supportive care measures are necessary for patient well-being and will be provided as necessary. We spent sufficient time to discuss many aspect of care, questions were answered to patient's satisfaction.  Lab MD cycle 4-day 8 CyBorD  cc Leonel Ramsay, MD   [name], MD, PhD Hematology Oncology [facility] at [facility] Pager- [phone] 09/28/2020

## 2020-09-28 NOTE — Telephone Encounter (Signed)
On 6/15-I spoke to patient/daughter regarding the findings noted on the L4 mild fracture.  History of trauma in the past.  Recommend evaluation with orthopedic surgeon [recent shoulder surgery].  Defer to PCP regarding orthopedic referral.  Patient/family in agreement.  GB

## 2020-09-28 NOTE — Progress Notes (Signed)
Glucose 418, Bili 3.9, & K 3.1. Per Dr. Tasia Catchings, okay to proceed with treatment. No venofer today.

## 2020-09-28 NOTE — Patient Instructions (Signed)
[facility]  Discharge Instructions: Thank you for choosing [facility] to provide your oncology and hematology care.  If you have a lab appointment with the [facility], please go directly to the [facility] and check in at the registration area.  Wear comfortable clothing and clothing appropriate for easy access to any Portacath or PICC line.   We strive to give you quality time with your provider. You may need to reschedule your appointment if you arrive late (15 or more minutes).  Arriving late affects you and other patients whose appointments are after yours.  Also, if you miss three or more appointments without notifying the office, you may be dismissed from the clinic at the provider's discretion.      For prescription refill requests, have your pharmacy contact our office and allow 72 hours for refills to be completed.    Today you received the following chemotherapy and/or immunotherapy agents Velcade & Darzalex      To help prevent nausea and vomiting after your treatment, we encourage you to take your nausea medication as directed.  BELOW ARE SYMPTOMS THAT SHOULD BE REPORTED IMMEDIATELY: *FEVER GREATER THAN 100.4 F (38 C) OR HIGHER *CHILLS OR SWEATING *NAUSEA AND VOMITING THAT IS NOT CONTROLLED WITH YOUR NAUSEA MEDICATION *UNUSUAL SHORTNESS OF BREATH *UNUSUAL BRUISING OR BLEEDING *URINARY PROBLEMS (pain or burning when urinating, or frequent urination) *BOWEL PROBLEMS (unusual diarrhea, constipation, pain near the anus) TENDERNESS IN MOUTH AND THROAT WITH OR WITHOUT PRESENCE OF ULCERS (sore throat, sores in mouth, or a toothache) UNUSUAL RASH, SWELLING OR PAIN  UNUSUAL VAGINAL DISCHARGE OR ITCHING   Items with * indicate a potential emergency and should be followed up as soon as possible or go to the Emergency Department if any problems should occur.  Please show the CHEMOTHERAPY ALERT CARD or IMMUNOTHERAPY ALERT CARD at  check-in to the Emergency Department and triage nurse.  Should you have questions after your visit or need to cancel or reschedule your appointment, please contact [facility]  [phone] and follow the prompts.  Office hours are 8:00 a.m. to 4:30 p.m. Monday - Friday. Please note that voicemails left after 4:00 p.m. may not be returned until the following business day.  We are closed weekends and major holidays. You have access to a nurse at all times for urgent questions. Please call the main number to the clinic [phone] and follow the prompts.  For any non-urgent questions, you may also contact your provider using MyChart. We now offer e-Visits for anyone 56 and older to request care online for non-urgent symptoms. For details visit [email or web address].   Also download the MyChart app! Go to the app store, search "MyChart", open the app, select [facility], and log in with your MyChart username and password.  Due to Covid, a mask is required upon entering the hospital/clinic. If you do not have a mask, one will be given to you upon arrival. For doctor visits, patients may have 1 support person aged 85 or older with them. For treatment visits, patients cannot have anyone with them due to current Covid guidelines and our immunocompromised population.

## 2020-09-29 ENCOUNTER — Other Ambulatory Visit: Payer: Self-pay | Admitting: Student

## 2020-09-29 ENCOUNTER — Other Ambulatory Visit (HOSPITAL_COMMUNITY): Payer: Self-pay | Admitting: Student

## 2020-09-29 DIAGNOSIS — S32040A Wedge compression fracture of fourth lumbar vertebra, initial encounter for closed fracture: Secondary | ICD-10-CM

## 2020-09-29 DIAGNOSIS — G959 Disease of spinal cord, unspecified: Secondary | ICD-10-CM

## 2020-10-03 ENCOUNTER — Other Ambulatory Visit: Payer: Self-pay | Admitting: Oncology

## 2020-10-03 DIAGNOSIS — E859 Amyloidosis, unspecified: Secondary | ICD-10-CM

## 2020-10-04 ENCOUNTER — Telehealth: Payer: Self-pay

## 2020-10-04 NOTE — Progress Notes (Signed)
Contacted [facility] psych to follow up on referral. Per office rep, case was closed on 6/18 due to patient not sending in new patient paperwork.

## 2020-10-04 NOTE — Telephone Encounter (Signed)
PA for Megace submitted via Cover My Meds (Key: [id number])

## 2020-10-05 ENCOUNTER — Inpatient Hospital Stay: Payer: Medicare Other

## 2020-10-05 ENCOUNTER — Inpatient Hospital Stay (HOSPITAL_BASED_OUTPATIENT_CLINIC_OR_DEPARTMENT_OTHER): Payer: Medicare Other | Admitting: Oncology

## 2020-10-05 ENCOUNTER — Encounter: Payer: Self-pay | Admitting: Oncology

## 2020-10-05 VITALS — BP 138/67 | HR 90 | Temp 97.3°F | Resp 18 | Wt 131.7 lb

## 2020-10-05 DIAGNOSIS — E859 Amyloidosis, unspecified: Secondary | ICD-10-CM

## 2020-10-05 DIAGNOSIS — D631 Anemia in chronic kidney disease: Secondary | ICD-10-CM

## 2020-10-05 DIAGNOSIS — R7989 Other specified abnormal findings of blood chemistry: Secondary | ICD-10-CM

## 2020-10-05 DIAGNOSIS — E8581 Light chain (AL) amyloidosis: Secondary | ICD-10-CM

## 2020-10-05 DIAGNOSIS — R112 Nausea with vomiting, unspecified: Secondary | ICD-10-CM

## 2020-10-05 DIAGNOSIS — Z5111 Encounter for antineoplastic chemotherapy: Secondary | ICD-10-CM | POA: Diagnosis not present

## 2020-10-05 DIAGNOSIS — N1831 Chronic kidney disease, stage 3a: Secondary | ICD-10-CM | POA: Diagnosis not present

## 2020-10-05 DIAGNOSIS — T451X5A Adverse effect of antineoplastic and immunosuppressive drugs, initial encounter: Secondary | ICD-10-CM

## 2020-10-05 LAB — CBC WITH DIFFERENTIAL/PLATELET
Abs Immature Granulocytes: 0.05 10*3/uL (ref 0.00–0.07)
Basophils Absolute: 0 10*3/uL (ref 0.0–0.1)
Basophils Relative: 0 %
Eosinophils Absolute: 0.1 10*3/uL (ref 0.0–0.5)
Eosinophils Relative: 1 %
HCT: 28.1 % — ABNORMAL LOW (ref 36.0–46.0)
Hemoglobin: 9.6 g/dL — ABNORMAL LOW (ref 12.0–15.0)
Immature Granulocytes: 1 %
Lymphocytes Relative: 15 %
Lymphs Abs: 1.1 10*3/uL (ref 0.7–4.0)
MCH: 26.2 pg (ref 26.0–34.0)
MCHC: 34.2 g/dL (ref 30.0–36.0)
MCV: 76.6 fL — ABNORMAL LOW (ref 80.0–100.0)
Monocytes Absolute: 0.6 10*3/uL (ref 0.1–1.0)
Monocytes Relative: 8 %
Neutro Abs: 5.5 10*3/uL (ref 1.7–7.7)
Neutrophils Relative %: 75 %
Platelets: 354 10*3/uL (ref 150–400)
RBC: 3.67 MIL/uL — ABNORMAL LOW (ref 3.87–5.11)
RDW: 26.3 % — ABNORMAL HIGH (ref 11.5–15.5)
WBC: 7.3 10*3/uL (ref 4.0–10.5)
nRBC: 0.8 % — ABNORMAL HIGH (ref 0.0–0.2)

## 2020-10-05 LAB — COMPREHENSIVE METABOLIC PANEL
ALT: 51 U/L — ABNORMAL HIGH (ref 0–44)
AST: 68 U/L — ABNORMAL HIGH (ref 15–41)
Albumin: 2.3 g/dL — ABNORMAL LOW (ref 3.5–5.0)
Alkaline Phosphatase: 1310 U/L — ABNORMAL HIGH (ref 38–126)
Anion gap: 10 (ref 5–15)
BUN: 19 mg/dL (ref 8–23)
CO2: 24 mmol/L (ref 22–32)
Calcium: 9.1 mg/dL (ref 8.9–10.3)
Chloride: 100 mmol/L (ref 98–111)
Creatinine, Ser: 0.98 mg/dL (ref 0.44–1.00)
GFR, Estimated: 60 mL/min — ABNORMAL LOW (ref >60)
Glucose, Bld: 350 mg/dL — ABNORMAL HIGH (ref 70–99)
Potassium: 3.5 mmol/L (ref 3.5–5.1)
Sodium: 134 mmol/L — ABNORMAL LOW (ref 135–145)
Total Bilirubin: 3.7 mg/dL — ABNORMAL HIGH (ref 0.3–1.2)
Total Protein: 5.8 g/dL — ABNORMAL LOW (ref 6.5–8.1)

## 2020-10-05 MED ORDER — SODIUM CHLORIDE 0.9 % IV SOLN
Freq: Once | INTRAVENOUS | Status: DC
  Filled 2020-10-05: qty 250

## 2020-10-05 MED ORDER — BORTEZOMIB CHEMO SQ INJECTION 3.5 MG (2.5MG/ML)
0.5000 mg/m2 | Freq: Once | INTRAMUSCULAR | Status: AC
  Administered 2020-10-05: 0.75 mg via SUBCUTANEOUS
  Filled 2020-10-05: qty 0.3

## 2020-10-05 MED ORDER — DEXAMETHASONE 4 MG PO TABS
20.0000 mg | ORAL_TABLET | Freq: Once | ORAL | Status: AC
  Administered 2020-10-05: 20 mg via ORAL
  Filled 2020-10-05: qty 5

## 2020-10-05 MED ORDER — SODIUM CHLORIDE 0.9 % IV SOLN
150.0000 mg | Freq: Once | INTRAVENOUS | Status: DC
  Filled 2020-10-05: qty 5

## 2020-10-05 MED ORDER — PALONOSETRON HCL INJECTION 0.25 MG/5ML: 0.2500 mg | Freq: Once | INTRAVENOUS | Status: DC

## 2020-10-05 NOTE — Progress Notes (Signed)
Per MD velcade 0.5mg /m2

## 2020-10-05 NOTE — Telephone Encounter (Signed)
Patient picked up the Megace prescription with OOP around $8.00

## 2020-10-05 NOTE — Progress Notes (Signed)
Hematology/Oncology  Follow up note [facility] [phone] [phone]   Patient Care Team: Fitzgerald, David P, MD as PCP - General (Infectious Diseases)  REFERRING PROVIDER: Fitzgerald, David P, MD  CHIEF COMPLAINTS/REASON FOR VISIT:  Follow-up for amyloidosis  HISTORY OF PRESENTING ILLNESS:   Nancy Rodriguez is a  75 y.o.  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  Work up include protein electrophoresis showed M protein 0.7,   Reviewed her previous medical records via care everywhere.  She was seen by Hematology Oncology at [facility] on 02/14/2017.  07/25/2007  SPEP showed M protein of 0.35, IFE showed IgG lamda 09/22/2007  Bone survey negative.  02/15/14 IgG 930, SPEP M protien 0.22, free kappa light chain 3.59, lamda 2.92, ratio 1.23  Hypercalcemia, resolved after stopping HCTZ.  # Transaminitis 03/28/20 US abdomen showed left liver lobe Complex 1.7 cm cyst  # 04/07/20 MRI abdomen w/wo contrast showed 2 benign liver cysts, 2 nonspecific hypovascular irighr liver lesions, abnormal bone marrow signal of L1 vertebral body. Patient was  advised to proceed with bone marrow biopsy and she declined.  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  Patient changed her mind and ask a biopsy to be canceled.  My team and I had multiple phone discussion with patient's daughter [name] and later with the patient's son [name].  Patient agreed with bone marrow biopsy and a biopsy was scheduled on 06/09/2020.  05/20/20 NT proBNP  was normal,  TSH slightly increased, normal T4 Normal factor X Normal coags Troponin 19. Refer to  cardiology for evaluation. May need cardiac MRI  # 06/01/2020 2D echo result was reviewed and discussed with patient.  Patient has normal LVEF 60-65%. Mild asymmetric left ventricular hypertrophy.  Left ventricular diastolic parameters are indeterminate. average left ventricular global longitudinal strain is -16.8 %.  # 06/09/2020, bone marrow biopsy showed monoclonal plasmacytosis, 8%, amyloid deposit present.  Absent iron stores. 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 t (14;20) #06/17/2020, further discussed about diagnosis and treatment plan.  Patient declined bone marrow transplant evaluation.  Decision was made to proceed with Dara-CyborD chemotherapy treatments. #May 2022, second opinion at [facility] with Dr. Kang.who agrees with current treatment plan.  He recommends to lower dexamethasone to 20 mg and decrease premed Tylenol to 325 mg  09/01/2020 cardiac MRI was performed at [facility].  Left ventricle is normal in size.  Mild to moderate basal septal hypertrophy.  Hyper dynamic LV function.  LVEF 71%.  Right ventricle is normal in size and wall thickness.  Systolic function normal.  Mild bi atrial enlargement, no significant aortic valve stenosis or regurgitation.  Trivial mitral regurgitation, mild tricuspid regurgitation and a trivial pulmonic regurgitation.  No evidence of MI, scarring or infiltration.  #History of major depression listed in outside problem list.  previous diagnosis and treatment details are not available to me in EMR  I have referred her to psychiatrist  INTERVAL HISTORY Nancy Rodriguez is a 75 y.o. female who has above history reviewed by me today presents for follow up visit for management of AL amyloidosis Problems and complaints   are listed below: Patient presents for the evaluation prior to chemotherapy treatment. Patient's currently off Lasix.  Lower extremity has improved. She has lost 6 pounds since last visit. No nausea  vomiting diarrhea, no new complaints.   Review of Systems  Constitutional:  Positive for fatigue. Negative for appetite change, chills, fever and unexpected weight change.  HENT:   Negative for hearing loss, nosebleeds and voice change.   Eyes:  Negative for eye problems.  Respiratory:  Negative for chest tightness and cough.   Cardiovascular:  Negative for chest pain and leg swelling.  Gastrointestinal:  Negative for abdominal distention, abdominal pain, blood in stool and nausea.  Endocrine: Negative for hot flashes.  Genitourinary:  Negative for difficulty urinating and frequency.   Musculoskeletal:  Negative for arthralgias.  Skin:  Negative for rash.  Neurological:  Negative for extremity weakness and numbness.  Hematological:  Negative for adenopathy.  Psychiatric/Behavioral:  Negative for confusion.    MEDICAL HISTORY:  Past Medical History:  Diagnosis Date   Anemia    Anemia in chronic kidney disease   Chronic kidney disease    Stage 3b chronic kidney disease   Diabetes mellitus without complication (HCC)    Hypercholesterolemia    Hypertension    MGUS (monoclonal gammopathy of unknown significance)    Osteoarthritis    Rheumatoid arthritis ([place])     SURGICAL HISTORY: Past Surgical History:  Procedure Laterality Date   HERNIA REPAIR     PARATHYROIDECTOMY      SOCIAL HISTORY: Social History   Socioeconomic History   Marital status: Widowed    Spouse name: Not on file   Number of children: 6   Years of education: Not on file   Highest education level: Not on file  Occupational History   Not on file  Tobacco Use   Smoking status: Never   Smokeless tobacco: Never  Vaping Use   Vaping Use: Never used  Substance and Sexual Activity   Alcohol use: No   Drug use: Never   Sexual activity: Not on file  Other Topics Concern   Not on file  Social History Narrative   Not on file   Social Determinants of Health   Financial Resource Strain: Not on file   Food Insecurity: Not on file  Transportation Needs: Not on file  Physical Activity: Not on file  Stress: Not on file  Social Connections: Not on file  Intimate Partner Violence: Not on file    FAMILY HISTORY: Family History  Problem Relation Age of Onset   Diabetes Daughter    Diabetes Son    Dementia Mother    Cancer Father    Esophageal cancer Sister    Brain cancer Brother     ALLERGIES:  is allergic to benazepril, lisinopril, tolmetin, and nsaids.  MEDICATIONS:  Current Outpatient Medications  Medication Sig Dispense Refill   acyclovir (ZOVIRAX) 400 MG tablet TAKE 1 TABLET BY MOUTH TWICE A DAY 60 tablet 5   albuterol (VENTOLIN HFA) 108 (90 Base) MCG/ACT inhaler Inhale 2 puffs into the lungs every 4 (four) hours as needed.     amLODipine (NORVASC) 5 MG tablet Take 5 mg by mouth daily.     cloNIDine (CATAPRES) 0.1 MG tablet Take by mouth.     cyclophosphamide (CYTOXAN) 50 MG capsule TAKE 10 CAPSULES (500 MG TOTAL) BY MOUTH ONCE A WEEK. TAKE WITH BREAKFAST. 40 capsule 3   docusate sodium (COLACE) 100 MG capsule Take 1 capsule by mouth as needed.  ferrous sulfate 325 (65 FE) MG tablet Take 1 tablet (325 mg total) by mouth 2 (two) times daily with a meal. 60 tablet 2   furosemide (LASIX) 20 MG tablet Take 2 tablets (40 mg total) by mouth daily. 60 tablet 1   hydrOXYzine (ATARAX/VISTARIL) 10 MG tablet Take 10 mg by mouth 2 (two) times daily as needed.     insulin glargine (LANTUS SOLOSTAR) 100 UNIT/ML Solostar Pen Inject 17 Units into the skin 2 (two) times daily. Pt takes in morning and bedtime.     LORazepam (ATIVAN) 0.5 MG tablet Take 1 tablet (0.5 mg total) by mouth every 6 (six) hours as needed (nausea vomiting). 30 tablet 0   megestrol (MEGACE) 40 MG/ML suspension TAKE 10 MLS (400 MG TOTAL) BY MOUTH DAILY. 240 mL 0   metoprolol succinate (TOPROL-XL) 50 MG 24 hr tablet TAKE 1 TABLET (50 MG TOTAL) BY MOUTH DAILY IN THE MORNING     mirtazapine (REMERON) 7.5 MG tablet Take 1  tablet (7.5 mg total) by mouth at bedtime. 30 tablet 2   omeprazole (PRILOSEC) 20 MG capsule Take 1 capsule (20 mg total) by mouth daily. 30 capsule 1   ondansetron (ZOFRAN) 8 MG tablet TAKE 8 MG BY MOUTH 30 TO 60 MIN PRIOR TO CYTOXAN ADMINISTRATION THEN TAKE 8 MG TWICE DAILY AS NEEDED FOR NAUSEA AND VOMITING. 30 tablet 1   potassium chloride SA (KLOR-CON) 20 MEQ tablet Take 1 tablet (20 mEq total) by mouth 2 (two) times daily. 60 tablet 1   promethazine (PHENERGAN) 25 MG tablet TAKE 1 TABLET BY MOUTH EVERY 8 HOURS AS NEEDED FOR NAUSEA OR VOMITING. 90 tablet 1   traMADol (ULTRAM) 50 MG tablet Take 1 tablet (50 mg total) by mouth every 6 (six) hours as needed for severe pain. 8 tablet 0   EPINEPHrine 0.3 mg/0.3 mL IJ SOAJ injection  (Patient not taking: Reported on 10/05/2020)     No current facility-administered medications for this visit.     PHYSICAL EXAMINATION: ECOG PERFORMANCE STATUS: 1 - Symptomatic but completely ambulatory Vitals:   10/05/20 1152  BP: 138/67  Pulse: 90  Resp: 18  Temp: (!) 97.3 F (36.3 C)   Filed Weights   10/05/20 1152  Weight: 131 lb 11.2 oz (59.7 kg)    Physical Exam Constitutional:      General: She is not in acute distress. HENT:     Head: Normocephalic and atraumatic.  Eyes:     General: No scleral icterus. Cardiovascular:     Rate and Rhythm: Normal rate and regular rhythm.     Heart sounds: Murmur heard.  Pulmonary:     Effort: Pulmonary effort is normal. No respiratory distress.     Breath sounds: No wheezing.  Abdominal:     General: Bowel sounds are normal. There is no distension.     Palpations: Abdomen is soft.  Musculoskeletal:        General: Swelling present. No deformity. Normal range of motion.     Cervical back: Normal range of motion and neck supple.     Comments: Bilateral lower extremity 1+ edema  Skin:    General: Skin is warm and dry.     Findings: No erythema or rash.  Neurological:     Mental Status: She is alert  and oriented to person, place, and time. Mental status is at baseline.     Cranial Nerves: No cranial nerve deficit.     Coordination: Coordination normal.  Psychiatric:  Mood and Affect: Mood normal.    LABORATORY DATA:  I have reviewed the data as listed Lab Results  Component Value Date   WBC 7.3 10/05/2020   HGB 9.6 (L) 10/05/2020   HCT 28.1 (L) 10/05/2020   MCV 76.6 (L) 10/05/2020   PLT 354 10/05/2020   Recent Labs    09/21/20 1104 09/28/20 1049 10/05/20 1109  NA 135 134* 134*  K 3.0* 3.1* 3.5  CL 99 101 100  CO2 $Re'25 24 24  'IQM$ GLUCOSE 213* 418* 350*  BUN 26* 25* 19  CREATININE 1.38* 1.30* 0.98  CALCIUM 8.5* 8.9 9.1  GFRNONAA 40* 43* 60*  PROT 5.5* 5.9* 5.8*  ALBUMIN 2.2* 2.3* 2.3*  AST 55* 56* 68*  ALT 40 38 51*  ALKPHOS 1,311* 1,229* 1,310*  BILITOT 2.8* 3.9* 3.7*    Iron/TIBC/Ferritin/ %Sat    Component Value Date/Time   IRON 35 08/31/2020 1037   TIBC 290 08/31/2020 1037   FERRITIN 82 08/31/2020 1037   IRONPCTSAT 12 08/31/2020 1037     08/25/2019, platelet count 491, WBC 7.5, hemoglobin 12 Creatinine 1.58, EGFR 37, calcium 10.8, albumin 4.2 Negative hepatitis B surface antigen, hepatitis B core antibody, Negative hepatitis C 08/05/2019, A1c 11.2   RADIOGRAPHIC STUDIES: I have personally reviewed the radiological images as listed and agreed with the findings in the report. CT ABDOMEN PELVIS WO CONTRAST  Result Date: 09/02/2020 CLINICAL DATA:  Light chain amyloidosis. Weight loss. Abdominal distension. EXAM: CT ABDOMEN AND PELVIS WITHOUT CONTRAST TECHNIQUE: Multidetector CT imaging of the abdomen and pelvis was performed following the standard protocol without IV contrast. COMPARISON:  MRI abdomen 04/07/2020 FINDINGS: Lower chest: Lung bases are clear. Hepatobiliary: No focal hepatic lesion on noncontrast exam. Gallstones present in a collapsed gallbladder. Caudate lobe is prominent. Fluid along the RIGHT hepatic margin. Benign cyst in LEFT hepatic  lobe. Pancreas: Pancreas is normal. No ductal dilatation. No pancreatic inflammation. Spleen: Normal spleen Adrenals/urinary tract: Adrenal glands normal. No nephrolithiasis ureterolithiasis. No obstructive uropathy. The density of the urine in the bladder is greater than typical (HU equal 27). No IV contrast administered. Stomach/Bowel: Stomach, small bowel, appendix, and cecum are normal. The colon and rectosigmoid colon are normal. Vascular/Lymphatic: Abdominal aorta is normal caliber with atherosclerotic calcification. There is no retroperitoneal or periportal lymphadenopathy. No pelvic lymphadenopathy. Reproductive: Uterus and adnexa unremarkable. Other: Moderate volume of free fluid along the margin liver and collecting in the pelvis. Focal of fluid simple fluid attenuation without organization. Musculoskeletal: No aggressive osseous lesion. IMPRESSION: 1. High-density urine within the bladder. Recommend urinalysis and correlation for cystitis or hematuria. 2. Moderate volume of free fluid in the RIGHT abdomen and pelvis is favored ascites. Recommend correlation with liver function. 3. Cholelithiasis without evidence cholecystitis. These results will be called to the ordering clinician or representative by the Radiologist Assistant, and communication documented in the PACS or [employer]. Electronically Signed   By: Suzy Bouchard M.D.   On: 09/02/2020 09:25   DG Lumbar Spine Complete  Result Date: 09/23/2020 CLINICAL DATA:  Acute low back pain. EXAM: LUMBAR SPINE - COMPLETE 4+ VIEW COMPARISON:  None. FINDINGS: Mildly displaced fracture is seen involving the L4 vertebral body. Mild grade 1 anterolisthesis of L4-5 is noted secondary to posterior facet joint hypertrophy. Mild degenerative disc disease is noted at L1-2, L2-3 and L5-S1 with anterior osteophyte formation. IMPRESSION: Mildly displaced acute fracture is seen involving the L4 vertebral body. These results will be called to the ordering  clinician or representative by the  Psychologist, clinical, and communication documented in the PACS or [facility]. Aortic Atherosclerosis (ICD10-I70.0). Electronically Signed   By: Marijo Conception M.D.   On: 09/23/2020 14:44   US Abdomen Limited  Result Date: 08/25/2020 CLINICAL DATA:  Ascites. EXAM: LIMITED ABDOMEN ULTRASOUND FOR ASCITES TECHNIQUE: Limited ultrasound survey for ascites was performed in all four abdominal quadrants. COMPARISON:  None. FINDINGS: 4 quadrant assessment of the abdomen/pelvis demonstrates no intraperitoneal free fluid. Small bilateral pleural effusions noted incidentally. IMPRESSION: No sonographic evidence of ascites. Electronically Signed   By: Misty Stanley M.D.   On: 08/25/2020 09:32   US Venous Img Lower Bilateral  Result Date: 07/27/2020 CLINICAL DATA:  Bilateral lower extremity swelling EXAM: BILATERAL LOWER EXTREMITY VENOUS DOPPLER ULTRASOUND TECHNIQUE: Gray-scale sonography with graded compression, as well as color Doppler and duplex ultrasound were performed to evaluate the lower extremity deep venous systems from the level of the common femoral vein and including the common femoral, femoral, profunda femoral, popliteal and calf veins including the posterior tibial, peroneal and gastrocnemius veins when visible. The superficial great saphenous vein was also interrogated. Spectral Doppler was utilized to evaluate flow at rest and with distal augmentation maneuvers in the common femoral, femoral and popliteal veins. COMPARISON:  None. FINDINGS: RIGHT LOWER EXTREMITY Common Femoral Vein: No evidence of thrombus. Normal compressibility, respiratory phasicity and response to augmentation. Saphenofemoral Junction: No evidence of thrombus. Normal compressibility and flow on color Doppler imaging. Profunda Femoral Vein: No evidence of thrombus. Normal compressibility and flow on color Doppler imaging. Femoral Vein: No evidence of thrombus. Normal compressibility,  respiratory phasicity and response to augmentation. Popliteal Vein: No evidence of thrombus. Normal compressibility, respiratory phasicity and response to augmentation. Calf Veins: No evidence of thrombus. Normal compressibility and flow on color Doppler imaging. Other Findings:  Lower extremity subcutaneous edema noted LEFT LOWER EXTREMITY Common Femoral Vein: No evidence of thrombus. Normal compressibility, respiratory phasicity and response to augmentation. Saphenofemoral Junction: No evidence of thrombus. Normal compressibility and flow on color Doppler imaging. Profunda Femoral Vein: No evidence of thrombus. Normal compressibility and flow on color Doppler imaging. Femoral Vein: No evidence of thrombus. Normal compressibility, respiratory phasicity and response to augmentation. Popliteal Vein: No evidence of thrombus. Normal compressibility, respiratory phasicity and response to augmentation. Calf Veins: No evidence of thrombus. Normal compressibility and flow on color Doppler imaging. Other Findings:  Lower extremity subcutaneous edema noted IMPRESSION: No evidence of deep venous thrombosis in either lower extremity. Electronically Signed   By: Jerilynn Mages.  Shick M.D.   On: 07/27/2020 14:28   DG Shoulder Left  Result Date: 09/15/2020 CLINICAL DATA:  75 year old female with anterior left shoulder dislocation after fall. Post reduction. EXAM: LEFT SHOULDER - 2+ VIEW COMPARISON:  0907 hours today. FINDINGS: Normal glenohumeral joint alignment now. Proximal left humerus and scapula appear intact. Mild degenerative changes at the left [facility] joint. Left clavicle and visible left ribs appear intact. IMPRESSION: Reduction of the left shoulder dislocation with no fracture identified. Electronically Signed   By: Genevie Ann M.D.   On: 09/15/2020 11:11   DG Shoulder Left  Result Date: 09/15/2020 CLINICAL DATA:  75 year old female status post fall and pain. EXAM: LEFT SHOULDER - 2+ VIEW COMPARISON:  Left shoulder series 11/30/2014.  FINDINGS: Anterior glenohumeral dislocation. No acute fracture identified. Left clavicle and scapula appear to remain normally aligned. Visible left ribs appear intact. IMPRESSION: Anterior left glenohumeral dislocation.  No fracture identified. Electronically Signed   By: Genevie Ann M.D.   On: 09/15/2020 09:25  ASSESSMENT & PLAN:  1. Light chain (AL) amyloidosis (HCC)   2. Encounter for antineoplastic chemotherapy   3. 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[place])   4. Chemotherapy induced nausea and vomiting   5. Elevated LFTs    # AL-amyloidosis Diagnosis of AL amyloidosis was discussed with patient.  Patient has biopsy confirmed liver involvement, possible kidney involvement- Labs reviewed and discussed with patient. Proceed with cycle 4  Day 15 CyBorD- [Daratumumab is now every 2 weeks] Hyperbilirubinemia and abnormal liver function. Bilirubin 3.7 and recommend patient to take 350 mg cyclophosphamide today..Dose reduced Velcade 0.5 mg/m,   #Chemotherapy-induced nausea, likely secondary to the oral cyclophosphamide.  Continue Emend and Aloxi as premed.  Continue home antiemetic regimen-Zofran, Ativan as instructed. Phenergan 25 mg every 8 hours as needed.  Symptoms are stable #Lack of IV access, discussed about option of Mediport placement.  She is undecided and wants to see how she does today with her IV access.  She understands that if IV access is not successfully attempted, Emend and Aloxi may not be able to be given and she will need to take her home oral antiemetics which will hopefully controls her nausea symptoms from cyclophosphamide.  #Hypokalemia, potassium level 3.5.  Recommend patient to increase potassium to 62meq twice daily.Marland Kitchen  #Chronic kidney disease, probably AL involvement follow-up with nephrology.  Stable creatinine.  24-hour urine protein has improved. Follow up with nephrology  #Anemia is multifactorial, from CKD, marrow suppression and iron  deficiency. Microcytic anemia likely due to iron deficiency, continue.  Ferrous sulfate 325 mg twice daily-[patient's preference over IV Venofer] She may also have underlying hemoglobinopathy due to the chronically decreased MCV.  Marland Kitchen  #Anxiety, recommend patient to take Remeron-unclear if patient is following the recommendation. #Unintentional weight loss, symptoms improved after started on Megace.  Continue  #Bilateral lower extremity swelling, neg DVT.  Improved.  Plan was discussed with patient and daughter.  Also discussed her son [name] over the phone. Supportive care measures are necessary for patient well-being and will be provided as necessary. We spent sufficient time to discuss many aspect of care, questions were answered to patient's satisfaction.  Lab MD cycle 4-day 22 CyBorD  cc Leonel Ramsay, MD   [name], MD, PhD Hematology Oncology [facility] Pager- [phone] 10/05/2020

## 2020-10-05 NOTE — Progress Notes (Signed)
Per Darci Current., RN per Dr. Tasia Catchings, okay to proceed with treatment despite labs.   IV attempted x 1 per patient request and was unsuccessful. Dr. Tasia Catchings made aware. Patient is to take oral antinausea medications at home.

## 2020-10-05 NOTE — Progress Notes (Signed)
Patient here for follow up. Pt notified that I followed up with psychiatry office and they need the new patient forms filled out before they can schedule her. She said she will work on getting those completed.

## 2020-10-05 NOTE — Telephone Encounter (Signed)
Megace PA has been denied.

## 2020-10-05 NOTE — Patient Instructions (Signed)
[facility] [place] [facility]  Discharge Instructions: Thank you for choosing [facility] to provide your oncology and hematology care.  If you have a lab appointment with the Cancer Center, please go directly to the Cancer Center and check in at the registration area.  Wear comfortable clothing and clothing appropriate for easy access to any Portacath or PICC line.   We strive to give you quality time with your provider. You may need to reschedule your appointment if you arrive late (15 or more minutes).  Arriving late affects you and other patients whose appointments are after yours.  Also, if you miss three or more appointments without notifying the office, you may be dismissed from the clinic at the provider's discretion.      For prescription refill requests, have your pharmacy contact our office and allow 72 hours for refills to be completed.    Today you received the following chemotherapy and/or immunotherapy agents Velcade      To help prevent nausea and vomiting after your treatment, we encourage you to take your nausea medication as directed.  BELOW ARE SYMPTOMS THAT SHOULD BE REPORTED IMMEDIATELY: *FEVER GREATER THAN 100.4 F (38 C) OR HIGHER *CHILLS OR SWEATING *NAUSEA AND VOMITING THAT IS NOT CONTROLLED WITH YOUR NAUSEA MEDICATION *UNUSUAL SHORTNESS OF BREATH *UNUSUAL BRUISING OR BLEEDING *URINARY PROBLEMS (pain or burning when urinating, or frequent urination) *BOWEL PROBLEMS (unusual diarrhea, constipation, pain near the anus) TENDERNESS IN MOUTH AND THROAT WITH OR WITHOUT PRESENCE OF ULCERS (sore throat, sores in mouth, or a toothache) UNUSUAL RASH, SWELLING OR PAIN  UNUSUAL VAGINAL DISCHARGE OR ITCHING   Items with * indicate a potential emergency and should be followed up as soon as possible or go to the Emergency Department if any problems should occur.  Please show the CHEMOTHERAPY ALERT CARD or IMMUNOTHERAPY ALERT CARD at check-in to  the Emergency Department and triage nurse.  Should you have questions after your visit or need to cancel or reschedule your appointment, please contact [facility] [place] [facility]  [phone] and follow the prompts.  Office hours are 8:00 a.m. to 4:30 p.m. Monday - Friday. Please note that voicemails left after 4:00 p.m. may not be returned until the following business day.  We are closed weekends and major holidays. You have access to a nurse at all times for urgent questions. Please call the main number to the clinic [phone] and follow the prompts.  For any non-urgent questions, you may also contact your provider using MyChart. We now offer e-Visits for anyone 18 and older to request care online for non-urgent symptoms. For details visit [email or web address].   Also download the MyChart app! Go to the app store, search "MyChart", open the app, select [facility], and log in with your MyChart username and password.  Due to Covid, a mask is required upon entering the hospital/clinic. If you do not have a mask, one will be given to you upon arrival. For doctor visits, patients may have 1 support person aged 18 or older with them. For treatment visits, patients cannot have anyone with them due to current Covid guidelines and our immunocompromised population.  

## 2020-10-06 ENCOUNTER — Other Ambulatory Visit: Payer: Self-pay | Admitting: Oncology

## 2020-10-06 ENCOUNTER — Other Ambulatory Visit (HOSPITAL_COMMUNITY): Payer: Self-pay

## 2020-10-06 DIAGNOSIS — E859 Amyloidosis, unspecified: Secondary | ICD-10-CM

## 2020-10-07 ENCOUNTER — Other Ambulatory Visit (HOSPITAL_COMMUNITY): Payer: Self-pay

## 2020-10-08 ENCOUNTER — Encounter: Payer: Self-pay | Admitting: Oncology

## 2020-10-08 ENCOUNTER — Ambulatory Visit
Admission: RE | Admit: 2020-10-08 | Discharge: 2020-10-08 | Disposition: A | Discharge status: Home / Self Care | Payer: Medicare Other | Source: Ambulatory Visit | Attending: Student | Admitting: Student

## 2020-10-08 DIAGNOSIS — G959 Disease of spinal cord, unspecified: Secondary | ICD-10-CM | POA: Diagnosis present

## 2020-10-08 DIAGNOSIS — S32040A Wedge compression fracture of fourth lumbar vertebra, initial encounter for closed fracture: Secondary | ICD-10-CM

## 2020-10-08 IMAGING — MR MR LUMBAR SPINE W/O CM
4 of 5 series · 33 of 48 positions shown · non-contrast
Comparison: Lumbar radiographs [DATE].  Lumbar MRI [DATE]

CLINICAL DATA: Closed compression fracture L4 vertebral body.

EXAM:
MRI LUMBAR SPINE WITHOUT CONTRAST
TECHNIQUE: Multiplanar, multisequence MR imaging of the lumbar spine was
performed. No intravenous contrast was administered.

[Series 18: T2 · sagittal · 4.0mm · 0.69mm/px · 8 of 17 slices shown (1 of 2)]
[im 1/17]
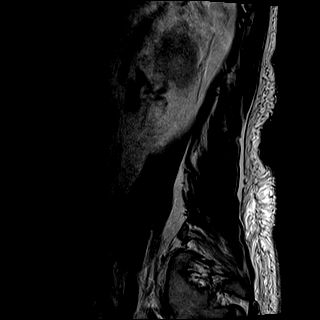
[im 3/17]
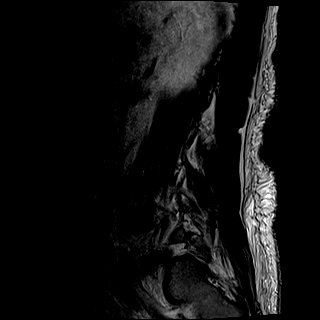
[im 5/17]
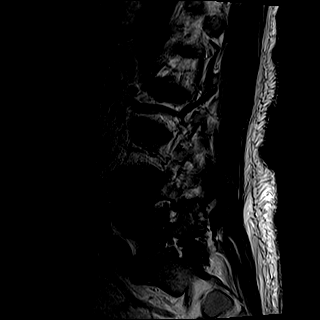
[im 7/17]
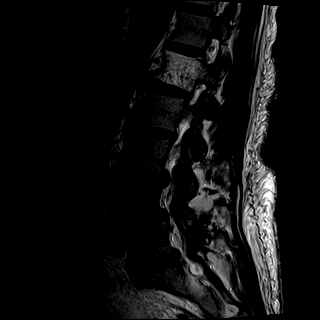
[im 10/17]
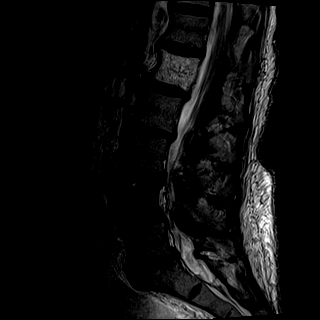
[im 12/17]
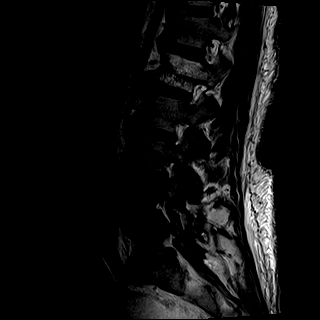
[im 14/17]
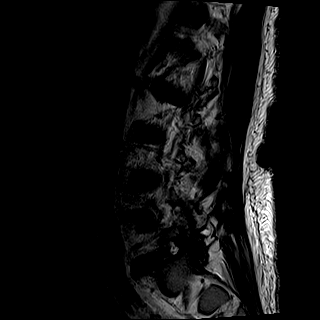
[im 17/17]
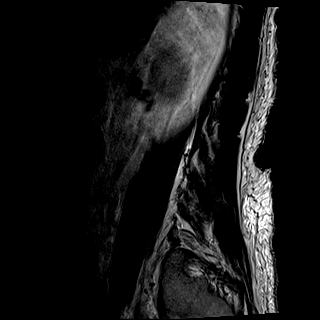

[Series 19: T1 · sagittal · 4.0mm · 0.69mm/px · 7 of 17 slices shown (1 of 2)]
[im 1/17]
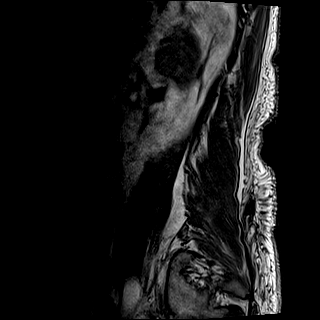
[im 3/17]
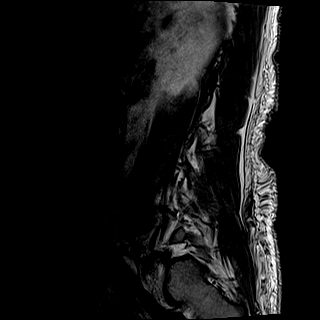
[im 6/17]
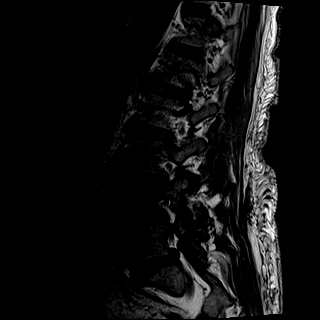
[im 9/17]
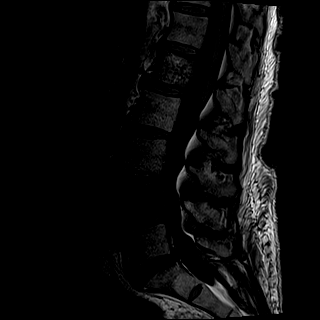
[im 11/17]
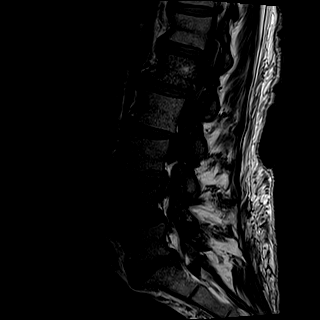
[im 14/17]
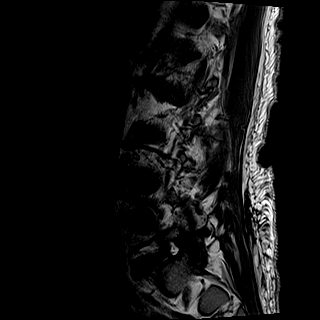
[im 17/17]
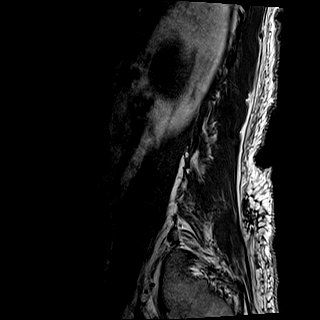

[Series 21: T2 · axial · 4.0mm · 0.78mm/px · z∈[-513,-305]mm · 9 of 32 slices shown (2 of 2)]
[im 1/32]
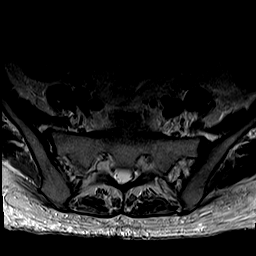
[im 6/32]
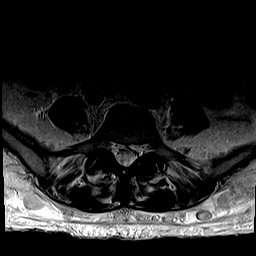
[im 11/32]
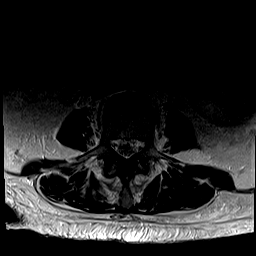
[im 13/32]
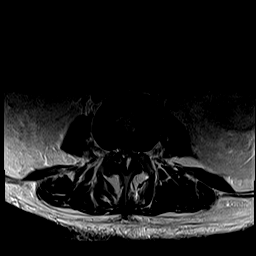
[im 16/32]
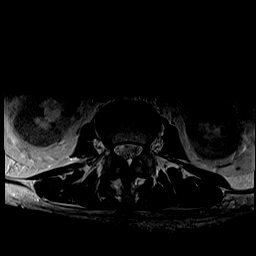
[im 19/32]
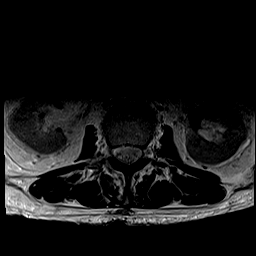
[im 21/32]
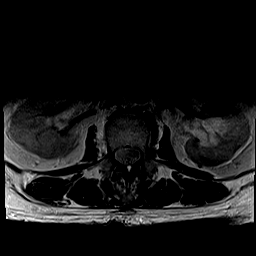
[im 26/32]
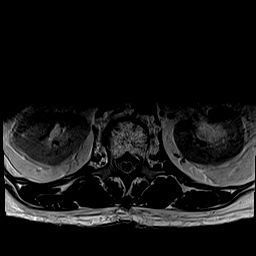
[im 32/32]
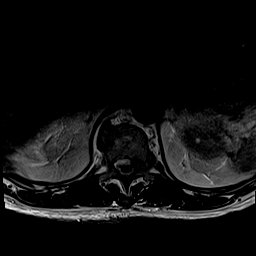

[Series 22: T1 · axial · 4.0mm · 0.39mm/px · z∈[-513,-305]mm · 9 of 32 slices shown (2 of 2)]
[im 1/32]
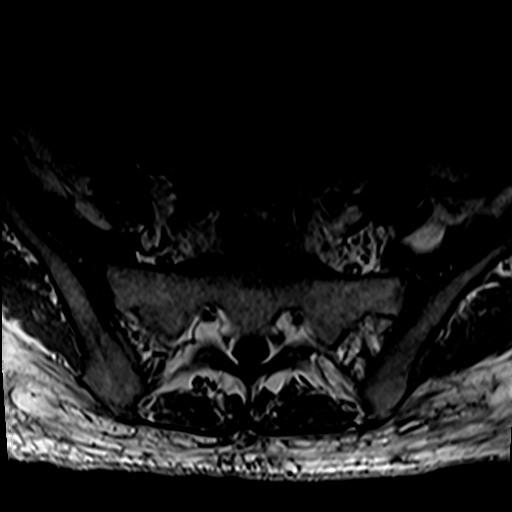
[im 6/32]
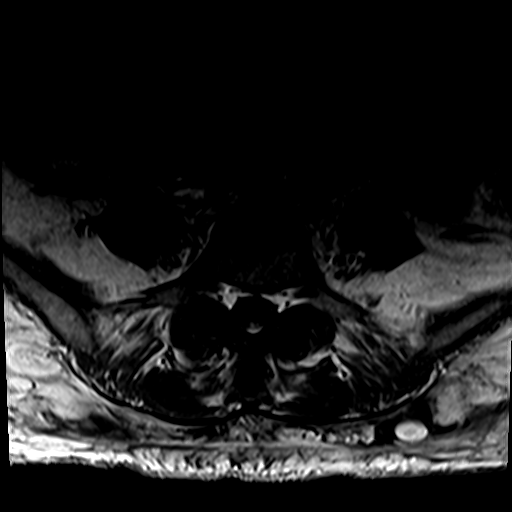
[im 11/32]
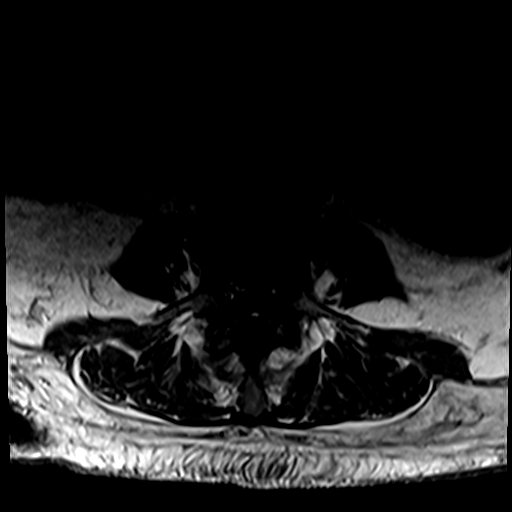
[im 13/32]
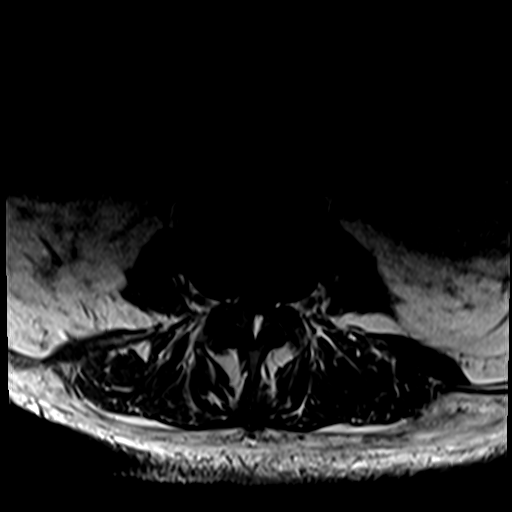
[im 16/32]
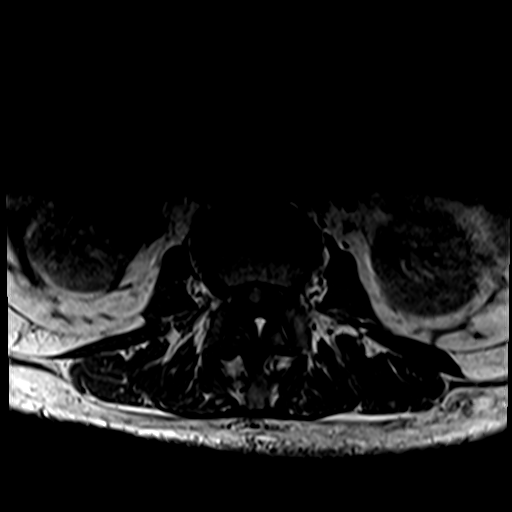
[im 19/32]
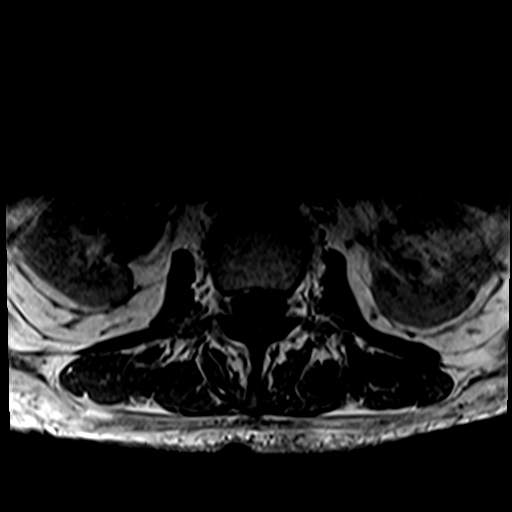
[im 21/32]
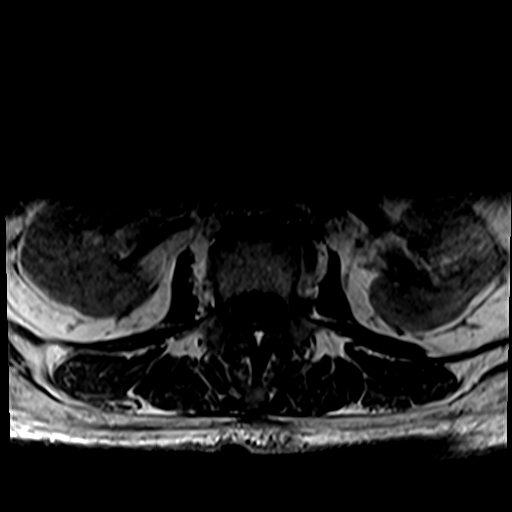
[im 26/32]
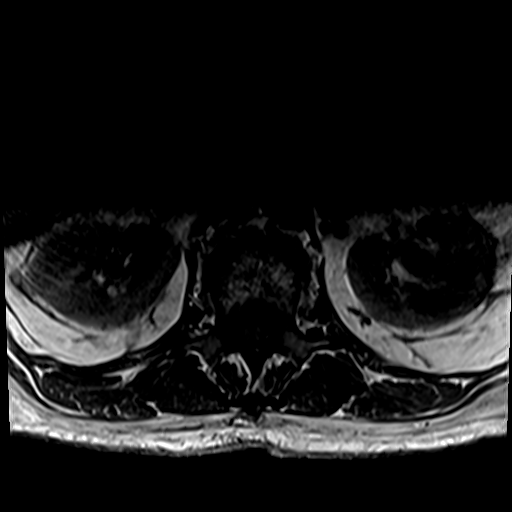
[im 32/32]
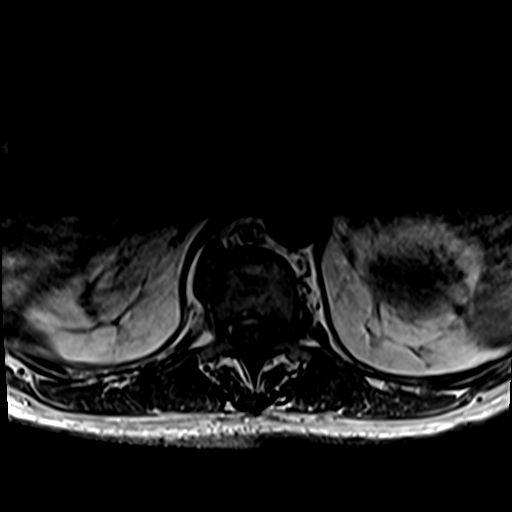

[33 of 48 positions shown; findings below may reference images not displayed]

FINDINGS: Segmentation:  Normal

Alignment: Mild retrolisthesis L1-2, L2-3, L3-4. Mild
anterolisthesis L4-5

Vertebrae: Moderate compression fracture of L4. No underlying lesion
is suspected. There is mild diffuse bone marrow edema. No
significant retropulsion of bone into the canal. Fracture appears to
involve the superior and inferior endplates.

Large hemangioma throughout the L1 vertebral body. No additional
fracture

Conus medullaris and cauda equina: Conus extends to the L2 level.
Conus and cauda equina appear normal.

Paraspinal and other soft tissues: Negative for paraspinous mass or
adenopathy or fluid collection. Mild paraspinous edema around the L4
fracture.

Disc levels:

T12-L1: Small central disc protrusion unchanged from the prior MRI.
No significant stenosis

L1-2: Mild retrolisthesis.  Mild disc degeneration without stenosis

L2-3: Mild retrolisthesis. Disc bulging and mild facet degeneration.
Negative for stenosis

L3-4: Disc bulging and diffuse endplate spurring. Shallow left
foraminal disc protrusion has progressed. Bilateral facet
hypertrophy. Progression of moderate spinal stenosis and moderate
subarticular stenosis on the left.

L4-5: Mild anterolisthesis. Central disc protrusion unchanged. Mild
left foraminal disc protrusion unchanged. Advanced facet
degeneration. Moderate spinal stenosis with progression. Moderate
subarticular stenosis on the left with progression.

L5-S1: Disc degeneration with diffuse endplate spurring. Mild
foraminal stenosis bilaterally unchanged.
IMPRESSION: 1. Moderately severe acute or subacute fracture of L4 without
significant retropulsion of bone into the canal. Large hemangioma
L1. No other fracture
2. Moderate spinal stenosis at L3-4 has progressed. Shallow left
foraminal disc protrusion has progressed. Moderate subarticular
stenosis on the left also with progression
3. Moderate spinal stenosis L4-5 with progression. Moderate
subarticular stenosis on the left with progression.

## 2020-10-08 IMAGING — MR MR CERVICAL SPINE W/O CM
7 of 10 series · 35 of 48 positions shown · non-contrast
Comparison: No pertinent prior exams available for comparison.

CLINICAL DATA: Cervical myelopathy. Additional history provided by
scanning technologist: Patient reports fall [DATE][REDACTED] resulting in
shoulder dislocation, numbness of left [DATE] fingers, difficulty with
movement.

EXAM:
MRI CERVICAL SPINE WITHOUT CONTRAST
TECHNIQUE: Multiplanar, multisequence MR imaging of the cervical spine was
performed. No intravenous contrast was administered.

[Series 9: T2 · sagittal · 3.0mm · 0.56mm/px · 4 of 15 slices shown (1 of 4)]
[im 1/15]
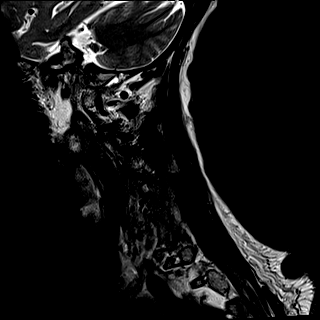
[im 5/15]
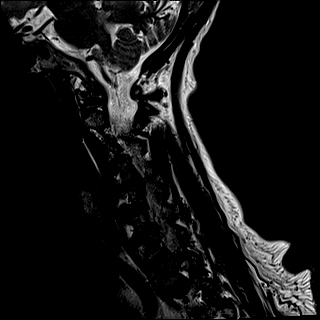
[im 10/15]
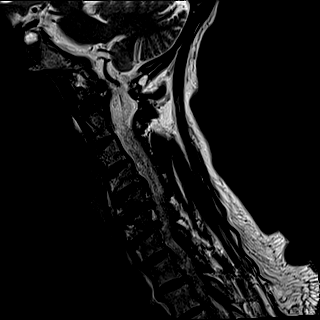
[im 15/15]
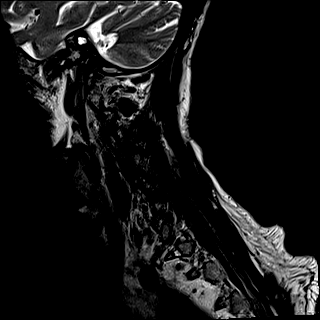

[Series 11: STIR · sagittal · 3.0mm · 0.56mm/px · 3 of 15 slices shown]
[im 1/15]
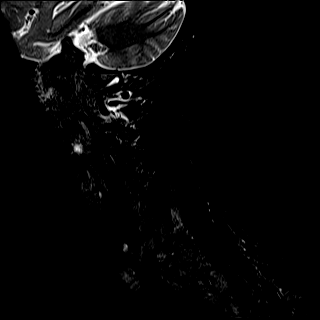
[im 8/15]
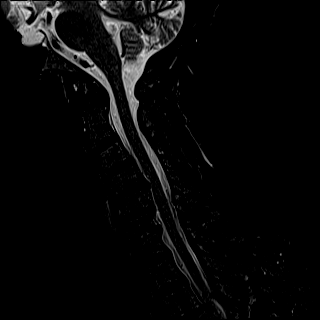
[im 15/15]
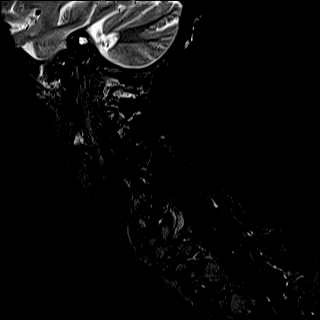

[Series 12: T2 · axial · 3.0mm · 0.70mm/px · z∈[-136,-57]mm · 6 of 27 slices shown (2 of 4)]
[im 1/27]
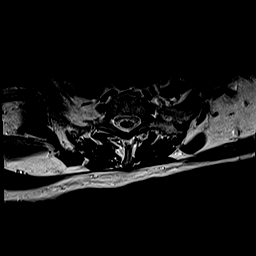
[im 6/27]
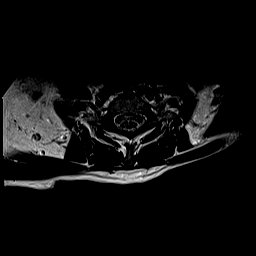
[im 11/27]
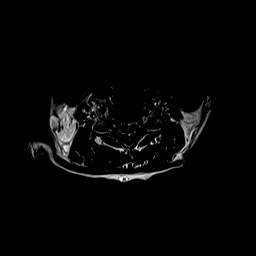
[im 16/27]
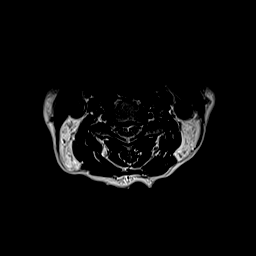
[im 21/27]
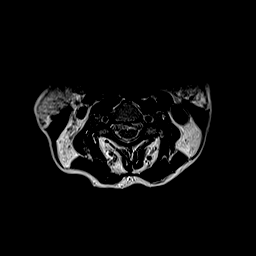
[im 27/27]
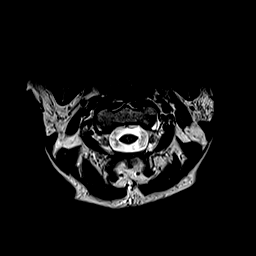

[Series 18: T2 · sagittal · 4.0mm · 0.69mm/px · 4 of 17 slices shown (3 of 4)]
[im 1/17]
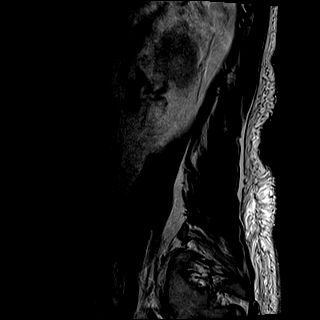
[im 6/17]
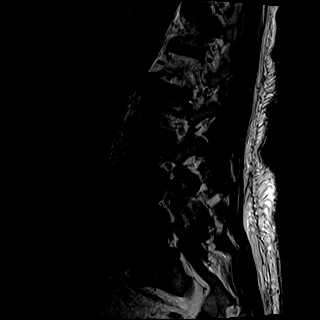
[im 11/17]
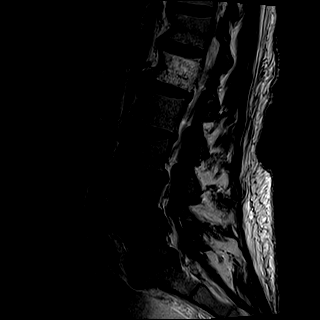
[im 17/17]
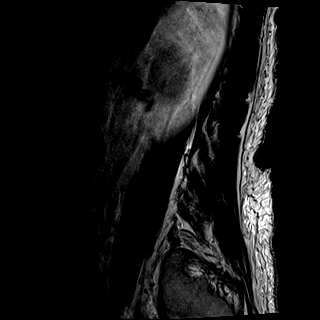

[Series 19: T1 · sagittal · 4.0mm · 0.69mm/px · 4 of 17 slices shown (1 of 2)]
[im 1/17]
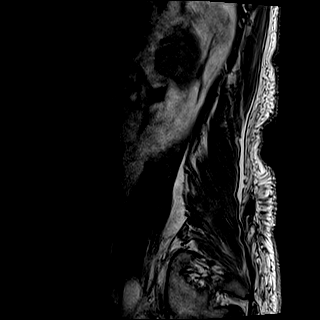
[im 6/17]
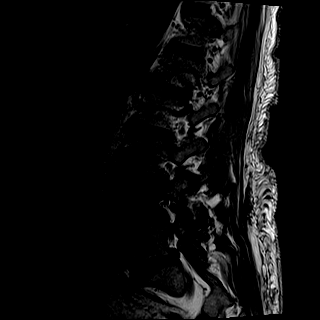
[im 11/17]
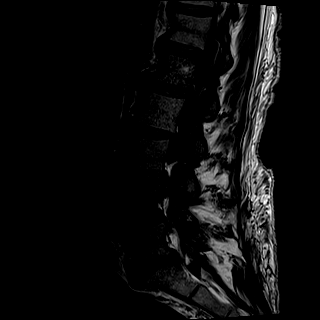
[im 17/17]
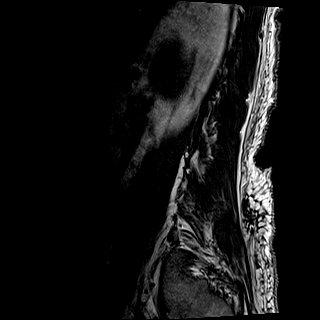

[Series 21: T2 · axial · 4.0mm · 0.78mm/px · z∈[-513,-305]mm · 7 of 32 slices shown (4 of 4)]
[im 1/32]
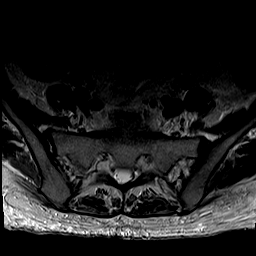
[im 6/32]
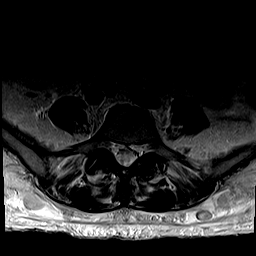
[im 11/32]
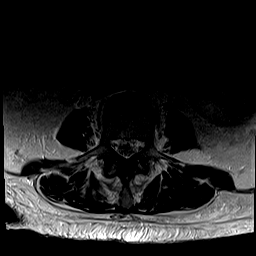
[im 16/32]
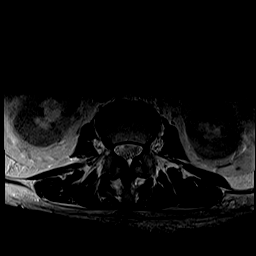
[im 21/32]
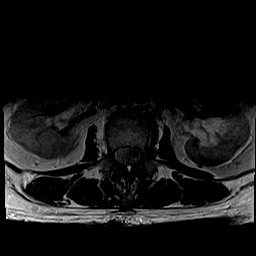
[im 26/32]
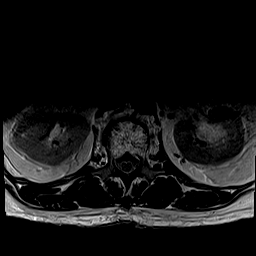
[im 32/32]
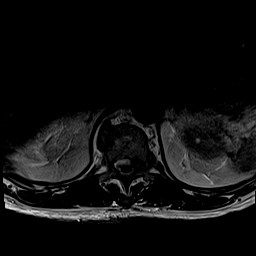

[Series 22: T1 · axial · 4.0mm · 0.39mm/px · z∈[-513,-305]mm · 7 of 32 slices shown (2 of 2)]
[im 1/32]
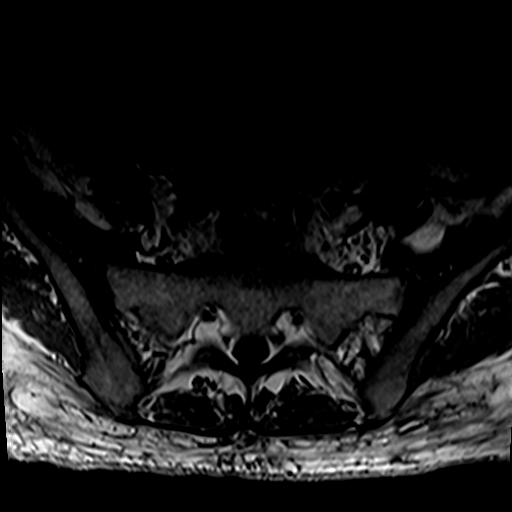
[im 6/32]
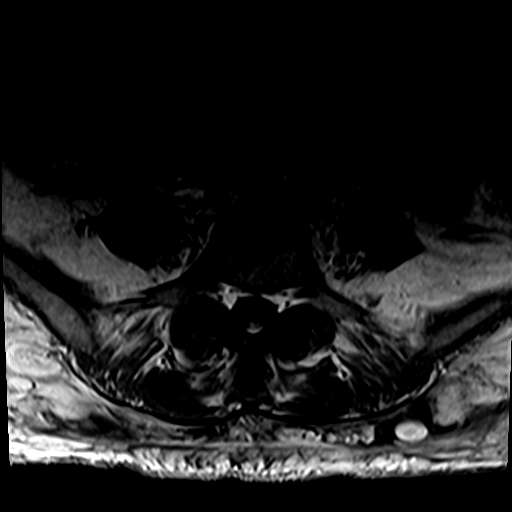
[im 11/32]
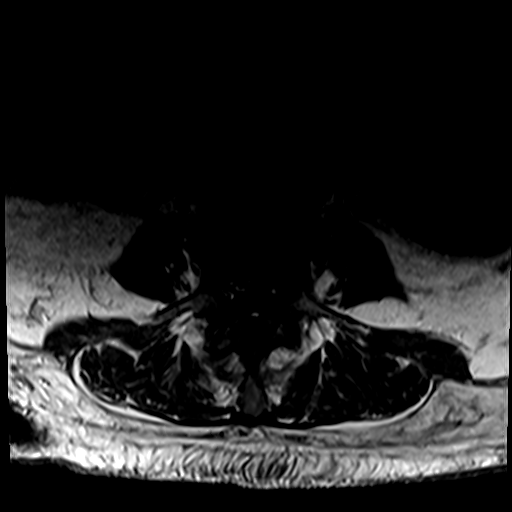
[im 16/32]
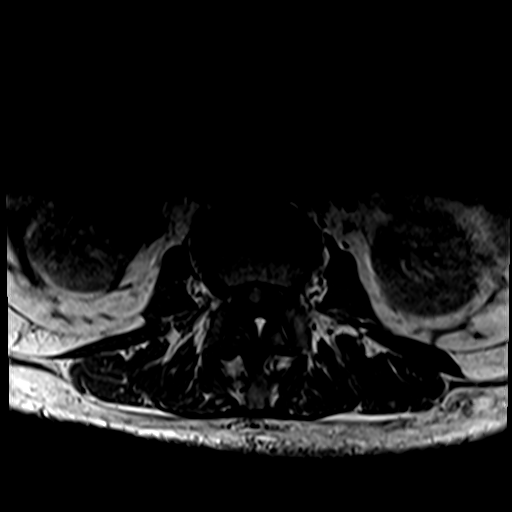
[im 21/32]
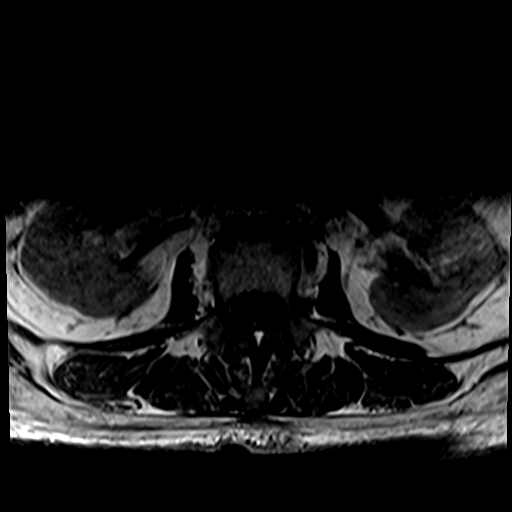
[im 26/32]
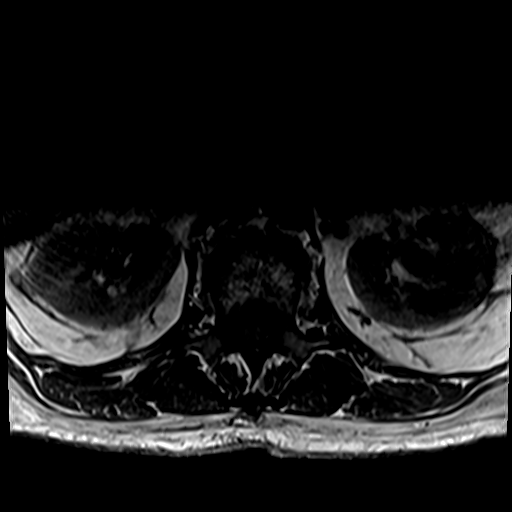
[im 32/32]
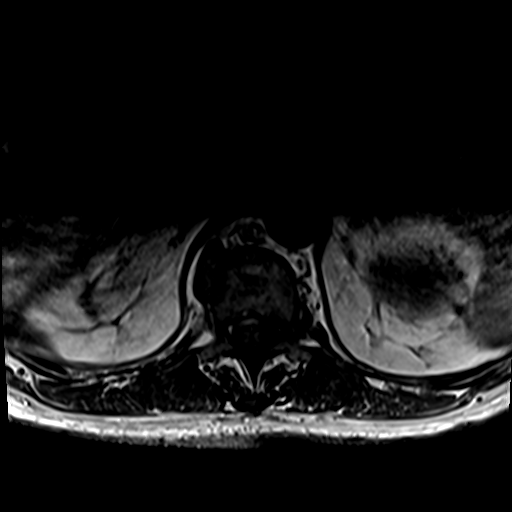

[35 of 48 positions shown; findings below may reference images not displayed]

FINDINGS: Alignment: Reversal of the expected cervical lordosis. Trace C2-C3
grade 1 anterolisthesis. 2 mm C3-C4 grade 1 anterolisthesis. Trace
C5-C6 grade 1 retrolisthesis. Trace C7-T1 grade 1 anterolisthesis.

Vertebrae: Vertebral body height is maintained. No significant
marrow edema or focal suspicious osseous lesion. Multilevel ventral
osteophytes.

Cord: No spinal cord signal abnormality is identified.

Posterior Fossa, vertebral arteries, paraspinal tissues:
Incidentally noted partially empty sella turcica. No abnormality
identified within included portions of the posterior fossa. Flow
voids preserved within the imaged cervical vertebral arteries.
Paraspinal soft tissues within normal limits. Small bilateral
atlantooccipital joint effusions.

Disc levels:

Moderate disc degeneration at C4-C5, C5-C6 and C6-C7. Mild disc
degeneration at the remaining levels.

C2-C3: Trace grade 1 anterolisthesis. Disc uncovering with tiny
right center disc protrusion. Minimal facet arthrosis. No
significant spinal canal or foraminal stenosis.

C3-C4: Grade 1 anterolisthesis. Disc uncovering with small central
disc protrusion. Uncovertebral hypertrophy. The disc protrusion
mildly effaces the ventral thecal sac without significant spinal
cord mass effect. Bilateral neural foraminal narrowing (mild right,
moderate left).

C4-C5: Posterior disc osteophyte complex with bilateral disc
osteophyte ridge/uncinate hypertrophy. Superimposed small central
disc protrusion. Mild spinal canal narrowing with contact upon the
ventral spinal cord. Moderate/severe bilateral neural foraminal
narrowing (greater on the right).

C5-C6: Trace grade 1 retrolisthesis. Disc bulge. Superimposed small
central disc extrusion with cranial migration to the mid L5
vertebral body level (series 9, image 8). Right-sided disc
osteophyte ridge/uncinate hypertrophy. Mild relative spinal canal
narrowing. Severe right neural foraminal narrowing.

C6-C7: Disc bulge with bilateral disc osteophyte ridge/uncinate
hypertrophy. Mild spinal canal narrowing. Bilateral neural foraminal
narrowing (moderate/severe right, moderate left).

C7-T1: Trace grade 1 anterolisthesis. Slight disc uncovering.
Superimposed small central disc protrusion. Minimal facet arthrosis
minimal partial effacement of the ventral thecal sac without spinal
cord mass effect. No significant foraminal narrowing.

T1-T2: Imaged sagittally. Small central disc protrusion. Mild
effacement of the ventral thecal sac without appreciable spinal cord
mass effect. No significant foraminal stenosis.

T2-T3: Trace grade 1 anterolisthesis. No appreciable significant
disc herniation or spinal canal stenosis. Facet arthrosis/ligamentum
flavum hypertrophy. Mild bilateral neural foraminal narrowing
(greater on the right).
IMPRESSION: Cervical spondylosis, as outlined.

No more than mild spinal canal stenosis. Most notably, a posterior
disc osteophyte complex and superimposed small central disc
protrusion contact the ventral spinal cord at C4-C5.

Multilevel neural foraminal narrowing, as detailed and greatest on
the left at C3-C4 (moderate), bilaterally at C4-C5 (moderate/severe)
and bilaterally at C6-C7 (moderate/severe right, moderate left).

Reversal of the expected cervical lordosis.

Mild multilevel grade 1 spondylolisthesis, as above.

Small bilateral atlantooccipital joint effusions.

## 2020-10-08 MED ORDER — CYCLOPHOSPHAMIDE 50 MG PO CAPS
ORAL_CAPSULE | ORAL | 3 refills | Status: DC
  Filled 2020-10-08: qty 40, 28d supply, fill #0

## 2020-10-10 ENCOUNTER — Other Ambulatory Visit (HOSPITAL_COMMUNITY): Payer: Self-pay

## 2020-10-10 ENCOUNTER — Telehealth: Payer: Self-pay | Admitting: Internal Medicine

## 2020-10-10 ENCOUNTER — Other Ambulatory Visit: Payer: Self-pay

## 2020-10-10 ENCOUNTER — Telehealth: Payer: Self-pay | Admitting: *Deleted

## 2020-10-10 DIAGNOSIS — E859 Amyloidosis, unspecified: Secondary | ICD-10-CM

## 2020-10-10 NOTE — Telephone Encounter (Signed)
Patient returning call.

## 2020-10-10 NOTE — Telephone Encounter (Signed)
Dr. Tasia Catchings would like for pt to have labs checked today and contact GYN regarding vaginal bleeding. If she is not established with GYN we can send referral to [facility]. Called pts cel phone and home phone and no answer. Detailed VM left for her to call back and schedule lab. Also called daughter [name] and no answer.

## 2020-10-10 NOTE — Telephone Encounter (Signed)
It seems unlikely that furosemide would precipitate vaginal or urethral bleeding.  Ms. Whisner is not on any other cardiac medications, including anticoagulants or antiplatelet agents, that would cause/worsen bleeding either.  I suggest that she restart her prior dose of furosemide and speak to her PCP as soon as possible for further assessment.  Nelva Bush, MD [facility]

## 2020-10-10 NOTE — Telephone Encounter (Signed)
Returned the patient call. Lmtcb.

## 2020-10-10 NOTE — Telephone Encounter (Signed)
Patient called reporting that she has had bright red vaginal bleeding (spotting) for the past 3 days and that Dr Janese Banks wanted to know if she had any due to her low blood counts. She denies any dizziness or light headedness. Please advise

## 2020-10-10 NOTE — Telephone Encounter (Signed)
Spoke wit the patient. Patient reports vaginal bleeding (spotting and blood in her urine) for over a week. She thought that lasix was the cause and stopped the medication a week ago.  Symptoms have not resolved and she has continued to have vaginal spotting of bright red blood and traces of blood in her urine. She denies any other signs of bleeding.  She has a hx of UTIs. She denies any current UTI symptoms.  Her LE swelling has been stable since holding lasix. Adv the patient that I did not think lasix was causing her issue. Her symptoms did not resolve while holding the medication. Recommended that she resume.  Adv the patient that she should contact her pcp asap for eval of her vaginal bleeding and hematuria.  Adv the patient that I will fwd the update to Dr. Saunders Revel for his advice regarding lasix.  Patient verbalized under standing and voiced appreciation for the call.

## 2020-10-10 NOTE — Telephone Encounter (Addendum)
Patient made aware of Dr. Darnelle Bos response and recommendation.  Patient asked how long will she need to be on lasix. Adv her that she can discuss it with Dr. Saunders Revel at her 10/13/20 appt.  Patient verbalized understanding and voiced appreciation for the call.

## 2020-10-10 NOTE — Telephone Encounter (Signed)
Dr Tasia Catchings patient?

## 2020-10-10 NOTE — Telephone Encounter (Signed)
Pt c/o medication issue:  1. Name of Medication: furosemide (LASIX)   2. How are you currently taking this medication (dosage and times per day)? 20 MG 2 tablet daily - no longer taking  3. Are you having a reaction (difficulty breathing--STAT)? Vaginal bleeding   4. What is your medication issue? Patient calling, states that she believes this medication has caused vaginal bleeding for the last 3 days.  Please call to discuss.

## 2020-10-11 ENCOUNTER — Encounter: Payer: Self-pay | Admitting: Oncology

## 2020-10-12 ENCOUNTER — Inpatient Hospital Stay: Payer: Medicare Other

## 2020-10-12 ENCOUNTER — Encounter: Payer: Self-pay | Admitting: Oncology

## 2020-10-12 ENCOUNTER — Other Ambulatory Visit (HOSPITAL_COMMUNITY): Payer: Self-pay

## 2020-10-12 ENCOUNTER — Inpatient Hospital Stay (HOSPITAL_BASED_OUTPATIENT_CLINIC_OR_DEPARTMENT_OTHER): Payer: Medicare Other | Admitting: Oncology

## 2020-10-12 ENCOUNTER — Other Ambulatory Visit: Payer: Self-pay | Admitting: Oncology

## 2020-10-12 VITALS — BP 152/70 | HR 95 | Temp 97.5°F | Resp 18 | Wt 134.0 lb

## 2020-10-12 VITALS — BP 133/66 | HR 88 | Temp 97.3°F | Resp 18

## 2020-10-12 DIAGNOSIS — N1831 Chronic kidney disease, stage 3a: Secondary | ICD-10-CM

## 2020-10-12 DIAGNOSIS — E859 Amyloidosis, unspecified: Secondary | ICD-10-CM

## 2020-10-12 DIAGNOSIS — N939 Abnormal uterine and vaginal bleeding, unspecified: Secondary | ICD-10-CM | POA: Diagnosis not present

## 2020-10-12 DIAGNOSIS — Z5111 Encounter for antineoplastic chemotherapy: Secondary | ICD-10-CM

## 2020-10-12 DIAGNOSIS — T451X5A Adverse effect of antineoplastic and immunosuppressive drugs, initial encounter: Secondary | ICD-10-CM

## 2020-10-12 DIAGNOSIS — E8581 Light chain (AL) amyloidosis: Secondary | ICD-10-CM

## 2020-10-12 DIAGNOSIS — Z8781 Personal history of (healed) traumatic fracture: Secondary | ICD-10-CM

## 2020-10-12 DIAGNOSIS — R112 Nausea with vomiting, unspecified: Secondary | ICD-10-CM | POA: Diagnosis not present

## 2020-10-12 DIAGNOSIS — D631 Anemia in chronic kidney disease: Secondary | ICD-10-CM

## 2020-10-12 DIAGNOSIS — R634 Abnormal weight loss: Secondary | ICD-10-CM | POA: Insufficient documentation

## 2020-10-12 DIAGNOSIS — M899 Disorder of bone, unspecified: Secondary | ICD-10-CM

## 2020-10-12 DIAGNOSIS — R7989 Other specified abnormal findings of blood chemistry: Secondary | ICD-10-CM

## 2020-10-12 LAB — CBC WITH DIFFERENTIAL/PLATELET
Abs Immature Granulocytes: 0.09 10*3/uL — ABNORMAL HIGH (ref 0.00–0.07)
Basophils Absolute: 0 10*3/uL (ref 0.0–0.1)
Basophils Relative: 0 %
Eosinophils Absolute: 0.1 10*3/uL (ref 0.0–0.5)
Eosinophils Relative: 1 %
HCT: 27.1 % — ABNORMAL LOW (ref 36.0–46.0)
Hemoglobin: 9.2 g/dL — ABNORMAL LOW (ref 12.0–15.0)
Immature Granulocytes: 1 %
Lymphocytes Relative: 31 %
Lymphs Abs: 2.9 10*3/uL (ref 0.7–4.0)
MCH: 26.7 pg (ref 26.0–34.0)
MCHC: 33.9 g/dL (ref 30.0–36.0)
MCV: 78.8 fL — ABNORMAL LOW (ref 80.0–100.0)
Monocytes Absolute: 0.7 10*3/uL (ref 0.1–1.0)
Monocytes Relative: 8 %
Neutro Abs: 5.4 10*3/uL (ref 1.7–7.7)
Neutrophils Relative %: 59 %
Platelets: 367 10*3/uL (ref 150–400)
RBC: 3.44 MIL/uL — ABNORMAL LOW (ref 3.87–5.11)
RDW: 25.7 % — ABNORMAL HIGH (ref 11.5–15.5)
WBC: 9.2 10*3/uL (ref 4.0–10.5)
nRBC: 0.7 % — ABNORMAL HIGH (ref 0.0–0.2)

## 2020-10-12 LAB — COMPREHENSIVE METABOLIC PANEL
ALT: 52 U/L — ABNORMAL HIGH (ref 0–44)
AST: 77 U/L — ABNORMAL HIGH (ref 15–41)
Albumin: 2.3 g/dL — ABNORMAL LOW (ref 3.5–5.0)
Alkaline Phosphatase: 1411 U/L — ABNORMAL HIGH (ref 38–126)
Anion gap: 10 (ref 5–15)
BUN: 17 mg/dL (ref 8–23)
CO2: 22 mmol/L (ref 22–32)
Calcium: 8.9 mg/dL (ref 8.9–10.3)
Chloride: 100 mmol/L (ref 98–111)
Creatinine, Ser: 1.07 mg/dL — ABNORMAL HIGH (ref 0.44–1.00)
GFR, Estimated: 54 mL/min — ABNORMAL LOW (ref >60)
Glucose, Bld: 135 mg/dL — ABNORMAL HIGH (ref 70–99)
Potassium: 3.1 mmol/L — ABNORMAL LOW (ref 3.5–5.1)
Sodium: 132 mmol/L — ABNORMAL LOW (ref 135–145)
Total Bilirubin: 4.4 mg/dL — ABNORMAL HIGH (ref 0.3–1.2)
Total Protein: 6 g/dL — ABNORMAL LOW (ref 6.5–8.1)

## 2020-10-12 LAB — PROTIME-INR
INR: 1.1 (ref 0.8–1.2)
Prothrombin Time: 14.2 seconds (ref 11.4–15.2)

## 2020-10-12 LAB — APTT: aPTT: 31 seconds (ref 24–36)

## 2020-10-12 MED ORDER — DARATUMUMAB-HYALURONIDASE-FIHJ 1800-30000 MG-UT/15ML ~~LOC~~ SOLN
1800.0000 mg | Freq: Once | SUBCUTANEOUS | Status: AC
  Administered 2020-10-12: 1800 mg via SUBCUTANEOUS
  Filled 2020-10-12: qty 15

## 2020-10-12 MED ORDER — ACETAMINOPHEN 325 MG PO TABS
325.0000 mg | ORAL_TABLET | Freq: Once | ORAL | Status: AC
  Administered 2020-10-12: 325 mg via ORAL
  Filled 2020-10-12: qty 1

## 2020-10-12 MED ORDER — DEXAMETHASONE 4 MG PO TABS
20.0000 mg | ORAL_TABLET | Freq: Once | ORAL | Status: AC
  Administered 2020-10-12: 20 mg via ORAL
  Filled 2020-10-12: qty 5

## 2020-10-12 MED ORDER — DIPHENHYDRAMINE HCL 25 MG PO CAPS
50.0000 mg | ORAL_CAPSULE | Freq: Once | ORAL | Status: AC
Start: 2020-10-12 — End: 2020-10-12
  Administered 2020-10-12: 50 mg via ORAL
  Filled 2020-10-12: qty 2

## 2020-10-12 NOTE — Progress Notes (Signed)
Dara only today, no velcade

## 2020-10-12 NOTE — Progress Notes (Addendum)
Hematology/Oncology  Follow up note [facility] [phone] [phone]   Patient Care Team: Fitzgerald, David P, MD as PCP - General (Infectious Diseases)  REFERRING PROVIDER: Fitzgerald, David P, MD  CHIEF COMPLAINTS/REASON FOR VISIT:  Follow-up for amyloidosis  HISTORY OF PRESENTING ILLNESS:   Nancy Rodriguez is a  75 y.o.  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  Work up include protein electrophoresis showed M protein 0.7,   Reviewed her previous medical records via care everywhere.  She was seen by Hematology Oncology at [facility] on 02/14/2017.  07/25/2007  SPEP showed M protein of 0.35, IFE showed IgG lamda 09/22/2007  Bone survey negative.  02/15/14 IgG 930, SPEP M protien 0.22, free kappa light chain 3.59, lamda 2.92, ratio 1.23  Hypercalcemia, resolved after stopping HCTZ.  # Transaminitis 03/28/20 US abdomen showed left liver lobe Complex 1.7 cm cyst  # 04/07/20 MRI abdomen w/wo contrast showed 2 benign liver cysts, 2 nonspecific hypovascular irighr liver lesions, abnormal bone marrow signal of L1 vertebral body. Patient was  advised to proceed with bone marrow biopsy and she declined.  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  Patient changed her mind and ask a biopsy to be canceled.  My team and I had multiple phone discussion with patient's daughter [name] and later with the patient's son [name].  Patient agreed with bone marrow biopsy and a biopsy was scheduled on 06/09/2020.  05/20/20 NT proBNP  was normal,  TSH slightly increased, normal T4 Normal factor X Normal coags Troponin 19. Refer to  cardiology for evaluation. May need cardiac MRI  # 06/01/2020 2D echo result was reviewed and discussed with patient.  Patient has normal LVEF 60-65%. Mild asymmetric left ventricular hypertrophy.  Left ventricular diastolic parameters are indeterminate. average left ventricular global longitudinal strain is -16.8 %.  # 06/09/2020, bone marrow biopsy showed monoclonal plasmacytosis, 8%, amyloid deposit present.  Absent iron stores. 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 t (14;20) #06/17/2020, further discussed about diagnosis and treatment plan.  Patient declined bone marrow transplant evaluation.  Decision was made to proceed with Dara-CyborD chemotherapy treatments. #May 2022, second opinion at [facility] with Dr. Kang.who agrees with current treatment plan.  He recommends to lower dexamethasone to 20 mg and decrease premed Tylenol to 325 mg  09/01/2020 cardiac MRI was performed at [facility].  Left ventricle is normal in size.  Mild to moderate basal septal hypertrophy.  Hyper dynamic LV function.  LVEF 71%.  Right ventricle is normal in size and wall thickness.  Systolic function normal.  Mild bi atrial enlargement, no significant aortic valve stenosis or regurgitation.  Trivial mitral regurgitation, mild tricuspid regurgitation and a trivial pulmonic regurgitation.  No evidence of MI, scarring or infiltration.  #History of major depression listed in outside problem list.  previous diagnosis and treatment details are not available to me in EMR  I have referred her to psychiatrist  INTERVAL HISTORY Nancy Rodriguez is a 75 y.o. female who has above history reviewed by me today presents for follow up visit for management of AL amyloidosis Problems and complaints   are listed below: Patient presents for the evaluation prior to chemotherapy treatment. During the interval patient has reported development of 1 week of vaginal bleeding which stopped yesterday. She  was seen by gynecology 2 days ago and was scheduled to have biopsy. She has poor IV access and was not able to receive IV Emend and IV Aloxi at last visit.  She took cyclophosphamide as well as her home antiemetics and did not vomit She gained 3 pounds since last visit.  She also has experienced back pain during the interval and has had MRI of the cervical and lumbar spine. 10/08/2020, cervical spine showed cervical spondylosis.  Spinal canal diagnosis and a posterior disc osteophyte complex and superimposed small central disc protrusion contact the ventral spinal cord at C4-C5.  Multilevel neuroforaminal narrowing. bilateral atlantooccipital joint effusions, multilevel grade 1 spondylolisthesis. MRI lumbar spine without contrast showed acute or subacute fracture of L4.   Moderate spinal stenosis at L3-4 has progressed. Shallow left foraminal disc protrusion has progressed. Moderate subarticular stenosis on the left also with progression  Moderate spinal stenosis L4-5 with progression. Moderate subarticular stenosis on the left with progression   Review of Systems  Constitutional:  Positive for fatigue. Negative for appetite change, chills, fever and unexpected weight change.  HENT:   Negative for hearing loss, nosebleeds and voice change.   Eyes:  Negative for eye problems.  Respiratory:  Negative for chest tightness and cough.   Cardiovascular:  Negative for chest pain and leg swelling.  Gastrointestinal:  Negative for abdominal distention, abdominal pain, blood in stool and nausea.  Endocrine: Negative for hot flashes.  Genitourinary:  Negative for difficulty urinating and frequency.   Musculoskeletal:  Positive for back pain and neck pain. Negative for arthralgias.  Skin:  Negative for rash.  Neurological:  Negative for extremity weakness and numbness.  Hematological:  Negative for adenopathy.  Psychiatric/Behavioral:  Negative for confusion.    MEDICAL HISTORY:  Past Medical History:   Diagnosis Date   Anemia    Anemia in chronic kidney disease   Chronic kidney disease    Stage 3b chronic kidney disease   Diabetes mellitus without complication (HCC)    Hypercholesterolemia    Hypertension    MGUS (monoclonal gammopathy of unknown significance)    Osteoarthritis    Rheumatoid arthritis ([place])     SURGICAL HISTORY: Past Surgical History:  Procedure Laterality Date   HERNIA REPAIR     PARATHYROIDECTOMY      SOCIAL HISTORY: Social History   Socioeconomic History   Marital status: Widowed    Spouse name: Not on file   Number of children: 6   Years of education: Not on file   Highest education level: Not on file  Occupational History   Not on file  Tobacco Use   Smoking status: Never   Smokeless tobacco: Never  Vaping Use   Vaping Use: Never used  Substance and Sexual Activity   Alcohol use: No   Drug use: Never   Sexual activity: Not on file  Other Topics Concern   Not on file  Social History Narrative   Not on file   Social Determinants of Health   Financial Resource Strain: Not on file  Food Insecurity: Not on file  Transportation Needs: Not on file  Physical Activity: Not on file  Stress: Not on file  Social Connections: Not on file  Intimate Partner Violence: Not on file    FAMILY HISTORY: Family History  Problem Relation Age of  Onset   Diabetes Daughter    Diabetes Son    Dementia Mother    Cancer Father    Esophageal cancer Sister    Brain cancer Brother     ALLERGIES:  is allergic to benazepril, lisinopril, tolmetin, and nsaids.  MEDICATIONS:  Current Outpatient Medications  Medication Sig Dispense Refill   acyclovir (ZOVIRAX) 400 MG tablet TAKE 1 TABLET BY MOUTH TWICE A DAY 60 tablet 5   albuterol (VENTOLIN HFA) 108 (90 Base) MCG/ACT inhaler Inhale 2 puffs into the lungs every 4 (four) hours as needed.     amLODipine (NORVASC) 5 MG tablet Take 5 mg by mouth daily.     cloNIDine (CATAPRES) 0.1 MG tablet Take by mouth.      cyclophosphamide (CYTOXAN) 50 MG capsule TAKE 10 CAPSULES (500 MG TOTAL) BY MOUTH ONCE A WEEK. TAKE WITH BREAKFAST. 40 capsule 3   docusate sodium (COLACE) 100 MG capsule Take 1 capsule by mouth as needed.     EPINEPHrine 0.3 mg/0.3 mL IJ SOAJ injection  (Patient not taking: Reported on 10/05/2020)     ferrous sulfate 325 (65 FE) MG tablet Take 1 tablet (325 mg total) by mouth 2 (two) times daily with a meal. 60 tablet 2   furosemide (LASIX) 20 MG tablet Take 2 tablets (40 mg total) by mouth daily. 60 tablet 1   hydrOXYzine (ATARAX/VISTARIL) 10 MG tablet Take 10 mg by mouth 2 (two) times daily as needed.     insulin glargine (LANTUS SOLOSTAR) 100 UNIT/ML Solostar Pen Inject 17 Units into the skin 2 (two) times daily. Pt takes in morning and bedtime.     LORazepam (ATIVAN) 0.5 MG tablet Take 1 tablet (0.5 mg total) by mouth every 6 (six) hours as needed (nausea vomiting). 30 tablet 0   megestrol (MEGACE) 40 MG/ML suspension TAKE 10 MLS (400 MG TOTAL) BY MOUTH DAILY. 240 mL 0   metoprolol succinate (TOPROL-XL) 50 MG 24 hr tablet TAKE 1 TABLET (50 MG TOTAL) BY MOUTH DAILY IN THE MORNING     mirtazapine (REMERON) 7.5 MG tablet Take 1 tablet (7.5 mg total) by mouth at bedtime. 30 tablet 2   omeprazole (PRILOSEC) 20 MG capsule Take 1 capsule (20 mg total) by mouth daily. 30 capsule 1   ondansetron (ZOFRAN) 8 MG tablet TAKE 8 MG BY MOUTH 30 TO 60 MIN PRIOR TO CYTOXAN ADMINISTRATION THEN TAKE 8 MG TWICE DAILY AS NEEDED FOR NAUSEA AND VOMITING. 30 tablet 1   potassium chloride SA (KLOR-CON) 20 MEQ tablet Take 1 tablet (20 mEq total) by mouth 2 (two) times daily. 60 tablet 1   promethazine (PHENERGAN) 25 MG tablet TAKE 1 TABLET BY MOUTH EVERY 8 HOURS AS NEEDED FOR NAUSEA OR VOMITING. 90 tablet 1   traMADol (ULTRAM) 50 MG tablet Take 1 tablet (50 mg total) by mouth every 6 (six) hours as needed for severe pain. 8 tablet 0   No current facility-administered medications for this visit.     PHYSICAL  EXAMINATION: ECOG PERFORMANCE STATUS: 1 - Symptomatic but completely ambulatory Vitals:   10/12/20 0900  BP: (!) 152/70  Pulse: 95  Resp: 18  Temp: (!) 97.5 F (36.4 C)  SpO2: 100%   Filed Weights   10/12/20 0900  Weight: 134 lb (60.8 kg)    Physical Exam Constitutional:      General: She is not in acute distress. HENT:     Head: Normocephalic and atraumatic.  Eyes:     General: No scleral icterus. Cardiovascular:       Rate and Rhythm: Normal rate and regular rhythm.     Heart sounds: Murmur heard.  Pulmonary:     Effort: Pulmonary effort is normal. No respiratory distress.     Breath sounds: No wheezing.  Abdominal:     General: Bowel sounds are normal. There is no distension.     Palpations: Abdomen is soft.  Musculoskeletal:        General: Swelling present. No deformity. Normal range of motion.     Cervical back: Normal range of motion and neck supple.     Comments: Bilateral lower extremity 1+ edema  Skin:    General: Skin is warm and dry.     Findings: No erythema or rash.  Neurological:     Mental Status: She is alert and oriented to person, place, and time. Mental status is at baseline.     Cranial Nerves: No cranial nerve deficit.     Coordination: Coordination normal.  Psychiatric:        Mood and Affect: Mood normal.    LABORATORY DATA:  I have reviewed the data as listed Lab Results  Component Value Date   WBC 7.3 10/05/2020   HGB 9.6 (L) 10/05/2020   HCT 28.1 (L) 10/05/2020   MCV 76.6 (L) 10/05/2020   PLT 354 10/05/2020   Recent Labs    09/21/20 1104 09/28/20 1049 10/05/20 1109  NA 135 134* 134*  K 3.0* 3.1* 3.5  CL 99 101 100  CO2 _0 GLUCOSE 213* 418* 350*  BUN 26* 25* 19  CREATININE 1.38* 1.30* 0.98  CALCIUM 8.5* 8.9 9.1  GFRNONAA 40* 43* 60*  PROT 5.5* 5.9* 5.8*  ALBUMIN 2.2* 2.3* 2.3*  AST 55* 56* 68*  ALT 40 38 51*  ALKPHOS 1,311* 1,229* 1,310*  BILITOT 2.8* 3.9* 3.7*    Iron/TIBC/Ferritin/ %Sat    Component  Value Date/Time   IRON 35 08/31/2020 1037   TIBC 290 08/31/2020 1037   FERRITIN 82 08/31/2020 1037   IRONPCTSAT 12 08/31/2020 1037     08/25/2019, platelet count 491, WBC 7.5, hemoglobin 12 Creatinine 1.58, EGFR 37, calcium 10.8, albumin 4.2 Negative hepatitis B surface antigen, hepatitis B core antibody, Negative hepatitis C 08/05/2019, A1c 11.2   RADIOGRAPHIC STUDIES: I have personally reviewed the radiological images as listed and agreed with the findings in the report. CT ABDOMEN PELVIS WO CONTRAST  Result Date: 09/02/2020 CLINICAL DATA:  Light chain amyloidosis. Weight loss. Abdominal distension. EXAM: CT ABDOMEN AND PELVIS WITHOUT CONTRAST TECHNIQUE: Multidetector CT imaging of the abdomen and pelvis was performed following the standard protocol without IV contrast. COMPARISON:  MRI abdomen 04/07/2020 FINDINGS: Lower chest: Lung bases are clear. Hepatobiliary: No focal hepatic lesion on noncontrast exam. Gallstones present in a collapsed gallbladder. Caudate lobe is prominent. Fluid along the RIGHT hepatic margin. Benign cyst in LEFT hepatic lobe. Pancreas: Pancreas is normal. No ductal dilatation. No pancreatic inflammation. Spleen: Normal spleen Adrenals/urinary tract: Adrenal glands normal. No nephrolithiasis ureterolithiasis. No obstructive uropathy. The density of the urine in the bladder is greater than typical (HU equal 27). No IV contrast administered. Stomach/Bowel: Stomach, small bowel, appendix, and cecum are normal. The colon and rectosigmoid colon are normal. Vascular/Lymphatic: Abdominal aorta is normal caliber with atherosclerotic calcification. There is no retroperitoneal or periportal lymphadenopathy. No pelvic lymphadenopathy. Reproductive: Uterus and adnexa unremarkable. Other: Moderate volume of free fluid along the margin liver and collecting in the pelvis. Focal of fluid simple fluid attenuation without organization. Musculoskeletal: No aggressive osseous lesion.  IMPRESSION: 1. High-density urine within the bladder. Recommend urinalysis and correlation for cystitis or hematuria. 2. Moderate volume of free fluid in the RIGHT abdomen and pelvis is favored ascites. Recommend correlation with liver function. 3. Cholelithiasis without evidence cholecystitis. These results will be called to the ordering clinician or representative by the Radiologist Assistant, and communication documented in the PACS or [employer]. Electronically Signed   By: Suzy Bouchard M.D.   On: 09/02/2020 09:25   DG Lumbar Spine Complete  Result Date: 09/23/2020 CLINICAL DATA:  Acute low back pain. EXAM: LUMBAR SPINE - COMPLETE 4+ VIEW COMPARISON:  None. FINDINGS: Mildly displaced fracture is seen involving the L4 vertebral body. Mild grade 1 anterolisthesis of L4-5 is noted secondary to posterior facet joint hypertrophy. Mild degenerative disc disease is noted at L1-2, L2-3 and L5-S1 with anterior osteophyte formation. IMPRESSION: Mildly displaced acute fracture is seen involving the L4 vertebral body. These results will be called to the ordering clinician or representative by the Radiologist Assistant, and communication documented in the PACS or [employer]. Aortic Atherosclerosis (ICD10-I70.0). Electronically Signed   By: Marijo Conception M.D.   On: 09/23/2020 14:44   MR CERVICAL SPINE WO CONTRAST  Result Date: 10/10/2020 CLINICAL DATA:  Cervical myelopathy. Additional history provided by scanning technologist: Patient reports fall June 3rd resulting in shoulder dislocation, numbness of left 4/5 fingers, difficulty with movement. EXAM: MRI CERVICAL SPINE WITHOUT CONTRAST TECHNIQUE: Multiplanar, multisequence MR imaging of the cervical spine was performed. No intravenous contrast was administered. COMPARISON:  No pertinent prior exams available for comparison. FINDINGS: Alignment: Reversal of the expected cervical lordosis. Trace C2-C3 grade 1 anterolisthesis. 2 mm C3-C4 grade 1  anterolisthesis. Trace C5-C6 grade 1 retrolisthesis. Trace C7-T1 grade 1 anterolisthesis. Vertebrae: Vertebral body height is maintained. No significant marrow edema or focal suspicious osseous lesion. Multilevel ventral osteophytes. Cord: No spinal cord signal abnormality is identified. Posterior Fossa, vertebral arteries, paraspinal tissues: Incidentally noted partially empty sella turcica. No abnormality identified within included portions of the posterior fossa. Flow voids preserved within the imaged cervical vertebral arteries. Paraspinal soft tissues within normal limits. Small bilateral atlantooccipital joint effusions. Disc levels: Moderate disc degeneration at C4-C5, C5-C6 and C6-C7. Mild disc degeneration at the remaining levels. C2-C3: Trace grade 1 anterolisthesis. Disc uncovering with tiny right center disc protrusion. Minimal facet arthrosis. No significant spinal canal or foraminal stenosis. C3-C4: Grade 1 anterolisthesis. Disc uncovering with small central disc protrusion. Uncovertebral hypertrophy. The disc protrusion mildly effaces the ventral thecal sac without significant spinal cord mass effect. Bilateral neural foraminal narrowing (mild right, moderate left). C4-C5: Posterior disc osteophyte complex with bilateral disc osteophyte ridge/uncinate hypertrophy. Superimposed small central disc protrusion. Mild spinal canal narrowing with contact upon the ventral spinal cord. Moderate/severe bilateral neural foraminal narrowing (greater on the right). C5-C6: Trace grade 1 retrolisthesis. Disc bulge. Superimposed small central disc extrusion with cranial migration to the mid L5 vertebral body level (series 9, image 8). Right-sided disc osteophyte ridge/uncinate hypertrophy. Mild relative spinal canal narrowing. Severe right neural foraminal narrowing. C6-C7: Disc bulge with bilateral disc osteophyte ridge/uncinate hypertrophy. Mild spinal canal narrowing. Bilateral neural foraminal narrowing  (moderate/severe right, moderate left). C7-T1: Trace grade 1 anterolisthesis. Slight disc uncovering. Superimposed small central disc protrusion. Minimal facet arthrosis minimal partial effacement of the ventral thecal sac without spinal cord mass effect. No significant foraminal narrowing. T1-T2: Imaged sagittally. Small central disc protrusion. Mild effacement of the ventral thecal sac without appreciable spinal cord mass effect. No significant foraminal stenosis. T2-T3:  Trace grade 1 anterolisthesis. No appreciable significant disc herniation or spinal canal stenosis. Facet arthrosis/ligamentum flavum hypertrophy. Mild bilateral neural foraminal narrowing (greater on the right). IMPRESSION: Cervical spondylosis, as outlined. No more than mild spinal canal stenosis. Most notably, a posterior disc osteophyte complex and superimposed small central disc protrusion contact the ventral spinal cord at C4-C5. Multilevel neural foraminal narrowing, as detailed and greatest on the left at C3-C4 (moderate), bilaterally at C4-C5 (moderate/severe) and bilaterally at C6-C7 (moderate/severe right, moderate left). Reversal of the expected cervical lordosis. Mild multilevel grade 1 spondylolisthesis, as above. Small bilateral atlantooccipital joint effusions. Electronically Signed   By: Kyle  Golden DO   On: 10/10/2020 10:42   MR LUMBAR SPINE WO CONTRAST  Result Date: 10/10/2020 CLINICAL DATA:  Closed compression fracture L4 vertebral body. EXAM: MRI LUMBAR SPINE WITHOUT CONTRAST TECHNIQUE: Multiplanar, multisequence MR imaging of the lumbar spine was performed. No intravenous contrast was administered. COMPARISON:  Lumbar radiographs 09/21/2020.  Lumbar MRI 05/11/2020 FINDINGS: Segmentation:  Normal Alignment: Mild retrolisthesis L1-2, L2-3, L3-4. Mild anterolisthesis L4-5 Vertebrae: Moderate compression fracture of L4. No underlying lesion is suspected. There is mild diffuse bone marrow edema. No significant retropulsion of  bone into the canal. Fracture appears to involve the superior and inferior endplates. 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 No additional fracture Conus medullaris and cauda equina: Conus extends to the L2 level. Conus and cauda equina appear normal. Paraspinal and other soft tissues: Negative for paraspinous mass or adenopathy or fluid collection. Mild paraspinous edema around the L4 fracture. Disc levels: T12-L1: Small central disc protrusion unchanged from the prior MRI. No significant stenosis L1-2: Mild retrolisthesis.  Mild disc degeneration without stenosis L2-3: Mild retrolisthesis. Disc bulging and mild facet degeneration. Negative for stenosis L3-4: Disc bulging and diffuse endplate spurring. Shallow left foraminal disc protrusion has progressed. Bilateral facet hypertrophy. Progression of moderate spinal stenosis and moderate subarticular stenosis on the left. L4-5: Mild anterolisthesis. Central disc protrusion unchanged. Mild left foraminal disc protrusion unchanged. Advanced facet degeneration. Moderate spinal stenosis with progression. Moderate subarticular stenosis on the left with progression. L5-S1: Disc degeneration with diffuse endplate spurring. Mild foraminal stenosis bilaterally unchanged. IMPRESSION: 1. Moderately severe acute or subacute fracture of L4 without significant retropulsion of bone into the canal. Large hemangioma L1. No other fracture 2. Moderate spinal stenosis at L3-4 has progressed. Shallow left foraminal disc protrusion has progressed. Moderate subarticular stenosis on the left also with progression 3. Moderate spinal stenosis L4-5 with progression. Moderate subarticular stenosis on the left with progression. Electronically Signed   By: Charles  Clark M.D.   On: 10/10/2020 10:41   US Abdomen Limited  Result Date: 08/25/2020 CLINICAL DATA:  Ascites. EXAM: LIMITED ABDOMEN ULTRASOUND FOR ASCITES TECHNIQUE: Limited ultrasound survey for ascites was performed in  all four abdominal quadrants. COMPARISON:  None. FINDINGS: 4 quadrant assessment of the abdomen/pelvis demonstrates no intraperitoneal free fluid. Small bilateral pleural effusions noted incidentally. IMPRESSION: No sonographic evidence of ascites. Electronically Signed   By: Eric  Mansell M.D.   On: 08/25/2020 09:32   US Venous Img Lower Bilateral  Result Date: 07/27/2020 CLINICAL DATA:  Bilateral lower extremity swelling EXAM: BILATERAL LOWER EXTREMITY VENOUS DOPPLER ULTRASOUND TECHNIQUE: Gray-scale sonography with graded compression, as well as color Doppler and duplex ultrasound were performed to evaluate the lower extremity deep venous systems from the level of the common femoral vein and including the common femoral, femoral, profunda femoral, popliteal and calf veins including the posterior tibial, peroneal and gastrocnemius veins when visible.   The superficial great saphenous vein was also interrogated. Spectral Doppler was utilized to evaluate flow at rest and with distal augmentation maneuvers in the common femoral, femoral and popliteal veins. COMPARISON:  None. FINDINGS: RIGHT LOWER EXTREMITY Common Femoral Vein: No evidence of thrombus. Normal compressibility, respiratory phasicity and response to augmentation. Saphenofemoral Junction: No evidence of thrombus. Normal compressibility and flow on color Doppler imaging. Profunda Femoral Vein: No evidence of thrombus. Normal compressibility and flow on color Doppler imaging. Femoral Vein: No evidence of thrombus. Normal compressibility, respiratory phasicity and response to augmentation. Popliteal Vein: No evidence of thrombus. Normal compressibility, respiratory phasicity and response to augmentation. Calf Veins: No evidence of thrombus. Normal compressibility and flow on color Doppler imaging. Other Findings:  Lower extremity subcutaneous edema noted LEFT LOWER EXTREMITY Common Femoral Vein: No evidence of thrombus. Normal compressibility, respiratory  phasicity and response to augmentation. Saphenofemoral Junction: No evidence of thrombus. Normal compressibility and flow on color Doppler imaging. Profunda Femoral Vein: No evidence of thrombus. Normal compressibility and flow on color Doppler imaging. Femoral Vein: No evidence of thrombus. Normal compressibility, respiratory phasicity and response to augmentation. Popliteal Vein: No evidence of thrombus. Normal compressibility, respiratory phasicity and response to augmentation. Calf Veins: No evidence of thrombus. Normal compressibility and flow on color Doppler imaging. Other Findings:  Lower extremity subcutaneous edema noted IMPRESSION: No evidence of deep venous thrombosis in either lower extremity. Electronically Signed   By: M.  Shick M.D.   On: 07/27/2020 14:28   DG Shoulder Left  Result Date: 09/15/2020 CLINICAL DATA:  75-year-old female with anterior left shoulder dislocation after fall. Post reduction. EXAM: LEFT SHOULDER - 2+ VIEW COMPARISON:  0907 hours today. FINDINGS: Normal glenohumeral joint alignment now. Proximal left humerus and scapula appear intact. Mild degenerative changes at the left AC joint. Left clavicle and visible left ribs appear intact. IMPRESSION: Reduction of the left shoulder dislocation with no fracture identified. Electronically Signed   By: H  Hall M.D.   On: 09/15/2020 11:11   DG Shoulder Left  Result Date: 09/15/2020 CLINICAL DATA:  75-year-old female status post fall and pain. EXAM: LEFT SHOULDER - 2+ VIEW COMPARISON:  Left shoulder series 11/30/2014. FINDINGS: Anterior glenohumeral dislocation. No acute fracture identified. Left clavicle and scapula appear to remain normally aligned. Visible left ribs appear intact. IMPRESSION: Anterior left glenohumeral dislocation.  No fracture identified. Electronically Signed   By: H  Hall M.D.   On: 09/15/2020 09:25       ASSESSMENT & PLAN:  1. Vaginal bleeding   2. Light chain (AL) amyloidosis (HCC)   3. Encounter for  antineoplastic chemotherapy   4. 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HCC)   5. Chemotherapy induced nausea and vomiting   6. Elevated LFTs   7. Loss of weight    # AL-amyloidosis Diagnosis of AL amyloidosis was discussed with patient.  Patient has biopsy confirmed liver involvement, possible kidney involvement- Labs reviewed and discussed with patient. Proceed with cycle 4  Day 15 CyBorD- [Daratumumab is now every 2 weeks] Hyperbilirubinemia and abnormal liver function. Bilirubin 4.4 and recommend patient to take 350 mg cyclophosphamide today. Hold Velcade due to severe hepatic impairment She has appointment with hepatologist at [facility]  #Back pain secondary to degenerative disease and spine fracture.  Check bone density. #Chemotherapy-induced nausea, likely secondary to the oral cyclophosphamide.  She did well with her home antiemetics.  Continue.  #Lack of IV access, discussed about option of Mediport placement.  She is undecided  #Hypokalemia, potassium   level 3.1.  Recommend patient to increase potassium to 40meq in AM  and 20meq in PM .  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  Stable creatinine. Repeat 24-hour urine protein  Follow up with nephrology  #Anemia is multifactorial, from CKD, marrow suppression and iron deficiency. Microcytic anemia likely due to iron deficiency, continue.  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  .  #Anxiety, recommend patient to take Remeron-unclear if patient is following the recommendation.  #Unintentional weight loss, symptoms improved after started on Megace.  Continue  #Bilateral lower extremity swelling, neg DVT.  Improved.  Plan was discussed with patient and daughter.  Not able to reach her son [name] over the phone. Supportive care measures are necessary for patient  well-being and will be provided as necessary. We spent sufficient time to discuss many aspect of care, questions were answered to patient's satisfaction.  Lab MD cycle 4-day 22 CyBorD  cc Fitzgerald, David P, MD    , MD, PhD Hematology Oncology [facility] Pager- [phone] 10/12/2020  

## 2020-10-12 NOTE — Addendum Note (Signed)
Addended by: Evelina Dun on: 10/12/2020 04:44 PM   Modules accepted: Orders

## 2020-10-12 NOTE — Progress Notes (Signed)
Per Benjamine Mola RN per Dr. Tasia Catchings, Darzalex Faspro only today, okay to proceed with 10/12/20 labs, and pt to be monitored 20 minutes post Darzalex Faspro.   1123: Pt tolerated treatment well. Site WNL, no redness, swelling or pain noted at injection site. Pt and VS stable at discharge.

## 2020-10-12 NOTE — Patient Instructions (Signed)
[facility]  Discharge Instructions: Thank you for choosing [facility] to provide your oncology and hematology care.  If you have a lab appointment with the [facility], please go directly to the [facility] and check in at the registration area.  Wear comfortable clothing and clothing appropriate for easy access to any Portacath or PICC line.   We strive to give you quality time with your provider. You may need to reschedule your appointment if you arrive late (15 or more minutes).  Arriving late affects you and other patients whose appointments are after yours.  Also, if you miss three or more appointments without notifying the office, you may be dismissed from the clinic at the provider's discretion.      For prescription refill requests, have your pharmacy contact our office and allow 72 hours for refills to be completed.    Today you received the following chemotherapy and/or immunotherapy agents Darzalex Faspro      To help prevent nausea and vomiting after your treatment, we encourage you to take your nausea medication as directed.  BELOW ARE SYMPTOMS THAT SHOULD BE REPORTED IMMEDIATELY: *FEVER GREATER THAN 100.4 F (38 C) OR HIGHER *CHILLS OR SWEATING *NAUSEA AND VOMITING THAT IS NOT CONTROLLED WITH YOUR NAUSEA MEDICATION *UNUSUAL SHORTNESS OF BREATH *UNUSUAL BRUISING OR BLEEDING *URINARY PROBLEMS (pain or burning when urinating, or frequent urination) *BOWEL PROBLEMS (unusual diarrhea, constipation, pain near the anus) TENDERNESS IN MOUTH AND THROAT WITH OR WITHOUT PRESENCE OF ULCERS (sore throat, sores in mouth, or a toothache) UNUSUAL RASH, SWELLING OR PAIN  UNUSUAL VAGINAL DISCHARGE OR ITCHING   Items with * indicate a potential emergency and should be followed up as soon as possible or go to the Emergency Department if any problems should occur.  Please show the CHEMOTHERAPY ALERT CARD or IMMUNOTHERAPY ALERT CARD at  check-in to the Emergency Department and triage nurse.  Should you have questions after your visit or need to cancel or reschedule your appointment, please contact [facility]  [phone] and follow the prompts.  Office hours are 8:00 a.m. to 4:30 p.m. Monday - Friday. Please note that voicemails left after 4:00 p.m. may not be returned until the following business day.  We are closed weekends and major holidays. You have access to a nurse at all times for urgent questions. Please call the main number to the clinic [phone] and follow the prompts.  For any non-urgent questions, you may also contact your provider using MyChart. We now offer e-Visits for anyone 90 and older to request care online for non-urgent symptoms. For details visit [email or web address].   Also download the MyChart app! Go to the app store, search "MyChart", open the app, select [facility], and log in with your MyChart username and password.  Due to Covid, a mask is required upon entering the hospital/clinic. If you do not have a mask, one will be given to you upon arrival. For doctor visits, patients may have 1 support person aged 74 or older with them. For treatment visits, patients cannot have anyone with them due to current Covid guidelines and our immunocompromised population.

## 2020-10-12 NOTE — Progress Notes (Signed)
Patient here for oncology follow-up appointment, expresses concerns of  Urinary frequency,Spotting, shoulder pain, requests pain med

## 2020-10-13 ENCOUNTER — Other Ambulatory Visit: Payer: Self-pay | Admitting: Internal Medicine

## 2020-10-13 ENCOUNTER — Other Ambulatory Visit: Payer: Self-pay

## 2020-10-13 ENCOUNTER — Ambulatory Visit (INDEPENDENT_AMBULATORY_CARE_PROVIDER_SITE_OTHER): Payer: Medicare Other | Admitting: Internal Medicine

## 2020-10-13 ENCOUNTER — Encounter: Payer: Self-pay | Admitting: Internal Medicine

## 2020-10-13 VITALS — BP 150/70 | HR 90 | Ht 63.0 in | Wt 137.0 lb

## 2020-10-13 DIAGNOSIS — C9000 Multiple myeloma not having achieved remission: Secondary | ICD-10-CM | POA: Diagnosis not present

## 2020-10-13 DIAGNOSIS — I10 Essential (primary) hypertension: Secondary | ICD-10-CM | POA: Diagnosis not present

## 2020-10-13 DIAGNOSIS — N939 Abnormal uterine and vaginal bleeding, unspecified: Secondary | ICD-10-CM | POA: Diagnosis not present

## 2020-10-13 DIAGNOSIS — R600 Localized edema: Secondary | ICD-10-CM

## 2020-10-13 LAB — KAPPA/LAMBDA LIGHT CHAINS
Kappa free light chain: 9.1 mg/L (ref 3.3–19.4)
Kappa, lambda light chain ratio: 1.23 (ref 0.26–1.65)
Lambda free light chains: 7.4 mg/L (ref 5.7–26.3)

## 2020-10-13 MED ORDER — FUROSEMIDE 20 MG PO TABS: 20.0000 mg | ORAL_TABLET | Freq: Every day | ORAL | 1 refills | Status: DC

## 2020-10-13 NOTE — Patient Instructions (Signed)
Medication Instructions:   Your physician has recommended you make the following change in your medication:   DECREASE Furosemide (Lasix) to 20mg  DAILY   - If your weight increases 2lbs overnight or 5lbs in a week you may resume Furosemide 40mg  daily  *If you need a refill on your cardiac medications before your next appointment, please call your pharmacy*   Lab Work:  None ordered  Testing/Procedures:  None ordered   Follow-Up: At [facility], you and your health needs are our priority.  As part of our continuing mission to provide you with exceptional heart care, we have created designated Provider Care Teams.  These Care Teams include your primary Cardiologist (physician) and Advanced Practice Providers (APPs -  Physician Assistants and Nurse Practitioners) who all work together to provide you with the care you need, when you need it.  We recommend signing up for the patient portal called "MyChart".  Sign up information is provided on this After Visit Summary.  MyChart is used to connect with patients for Virtual Visits (Telemedicine).  Patients are able to view lab/test results, encounter notes, upcoming appointments, etc.  Non-urgent messages can be sent to your provider as well.   To learn more about what you can do with MyChart, go to [email or web address].    Your next appointment:   6 week(s)  The format for your next appointment:   In Person  Provider:   You may see Dr. [name] End or one of the following Advanced Practice Providers on your designated Care Team:   Murray Hodgkins, NP Christell Faith, PA-C Marrianne Mood, PA-C Cadence Lemont, [place] Laurann Montana, NP

## 2020-10-13 NOTE — Progress Notes (Signed)
Follow-up Outpatient Visit Date: 10/13/2020  Primary Care Provider: Leonel Ramsay, MD [place] [address]  Chief Complaint: Follow-up edema  HPI:  [name] is a 75 y.o. 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  I last saw her a month ago, at which time Ms. Pentland continued to have significant leg swelling that had only improved slightly with addition of furosemide 20 mg daily.  Today, Ms. Gedeon reports that her leg swelling is better after increasing furosemide to 40 mg daily.  She has also been using compression stockings on her calves with good results.  She had a mechanical fall with shoulder dislocation earlier this month.  That her shoulder was reduced and arm immobilized with a sling.  Unfortunately, she has subsequently developed weakness of her thumb, index finger, and middle finger on the left.  She is currently undergoing orthopedic/neurologic work[facility]up at [facility].  Ms. Wadle notes that she has lost quite a bit of weight over the last few months in the setting of a poor appetite.  She is now on my Gesterol and has noticed slight improvement in her appetite.  She denies chest pain, shortness of breath, and palpitations.  Ms. Pitzer recently noticed some vaginal spotting and was concerned that this could be due to furosemide.  --------------------------------------------------------------------------------------------------  Cardiovascular History & Procedures: Cardiovascular Problems: Question cardiac amyloidosis and abnormal EKG   Risk Factors: Hypertension, hyperlipidemia, diabetes mellitus, and age greater than 54   Cath/PCI: None   CV Surgery: None   EP Procedures and Devices: None   Non-Invasive Evaluation(s): Cardiac MRI (09/01/2020, [facility]): Normal LV size  with mild to moderate basal sigmoid septal hypertrophy (up to 1.6 mm) with hyperdynamic left ventricular function (LVEF 71%).  No abnormal delayed hyperenhancement to suggest scarring or infiltration.  Normal RV size and function.  Mild biatrial enlargement.  Trileaflet aortic valve without stenosis or regurgitation.  Trivial mitral and mild tricuspid regurgitation.  Normal pericardium.  Small bilateral pleural effusions. TTE (06/01/2020): Normal LV size with mild asymmetric hypertrophy of the basal septum.  LVEF 60-65% with GLS -16.8%.  Diastolic parameters indeterminate.  Normal RV size and function.  Mild mitral regurgitation.  No obvious findings of cardiac amyloidosis. Exercise stress echocardiogram (04/01/2012, [facility]): Normal study.  Recent CV Pertinent Labs: Lab Results  Component Value Date   INR 1.1 10/12/2020   K 3.1 (L) 10/12/2020   BUN 17 10/12/2020   CREATININE 1.07 (H) 10/12/2020    Past medical and surgical history were reviewed and updated in EPIC.  Current Meds  Medication Sig   acyclovir (ZOVIRAX) 400 MG tablet TAKE 1 TABLET BY MOUTH TWICE A DAY   albuterol (VENTOLIN HFA) 108 (90 Base) MCG/ACT inhaler Inhale 2 puffs into the lungs every 4 (four) hours as needed.   amLODipine (NORVASC) 5 MG tablet Take 5 mg by mouth daily.   cyclophosphamide (CYTOXAN) 50 MG capsule TAKE 10 CAPSULES (500 MG TOTAL) BY MOUTH ONCE A WEEK. TAKE WITH BREAKFAST.   docusate sodium (COLACE) 100 MG capsule Take 1 capsule by mouth as needed.   EPINEPHrine 0.3 mg/0.3 mL IJ SOAJ injection Inject 0.3 mg into the muscle as needed.   ferrous sulfate 325 (65 FE) MG tablet Take 1 tablet (325 mg total) by mouth 2 (two) times daily with a meal.   furosemide (LASIX) 20 MG tablet Take  2 tablets (40 mg total) by mouth daily.   hydrOXYzine (ATARAX/VISTARIL) 10 MG tablet Take 10 mg by mouth 2 (two) times daily as needed.   insulin glargine (LANTUS SOLOSTAR) 100 UNIT/ML Solostar Pen Inject 17 Units into the skin 2  (two) times daily. Pt takes in morning and bedtime.   LORazepam (ATIVAN) 0.5 MG tablet Take 1 tablet (0.5 mg total) by mouth every 6 (six) hours as needed (nausea vomiting).   megestrol (MEGACE) 40 MG/ML suspension TAKE 10 MLS (400 MG TOTAL) BY MOUTH DAILY.   metoprolol succinate (TOPROL-XL) 50 MG 24 hr tablet TAKE 1 TABLET (50 MG TOTAL) BY MOUTH DAILY IN THE MORNING   mirtazapine (REMERON) 7.5 MG tablet Take 1 tablet (7.5 mg total) by mouth at bedtime.   omeprazole (PRILOSEC) 20 MG capsule Take 1 capsule (20 mg total) by mouth daily.   ondansetron (ZOFRAN) 8 MG tablet TAKE 8 MG BY MOUTH 30 TO 60 MIN PRIOR TO CYTOXAN ADMINISTRATION THEN TAKE 8 MG TWICE DAILY AS NEEDED FOR NAUSEA AND VOMITING.   potassium chloride SA (KLOR-CON) 20 MEQ tablet Take 1 tablet (20 mEq total) by mouth 2 (two) times daily.   promethazine (PHENERGAN) 25 MG tablet TAKE 1 TABLET BY MOUTH EVERY 8 HOURS AS NEEDED FOR NAUSEA OR VOMITING.   traMADol (ULTRAM) 50 MG tablet Take 1 tablet (50 mg total) by mouth every 6 (six) hours as needed for severe pain.    Allergies: Benazepril, Lisinopril, Tolmetin, and Nsaids  Social History   Tobacco Use   Smoking status: Never   Smokeless tobacco: Never  Vaping Use   Vaping Use: Never used  Substance Use Topics   Alcohol use: No   Drug use: Never    Family History  Problem Relation Age of Onset   Diabetes Daughter    Diabetes Son    Dementia Mother    Cancer Father    Esophageal cancer Sister    Brain cancer Brother     Review of Systems: A 12-system review of systems was performed and was negative except as noted in the HPI.  --------------------------------------------------------------------------------------------------  Physical Exam: BP (!) 150/70 (BP Location: Right Arm, Patient Position: Sitting, Cuff Size: Normal)   Pulse 90   Ht $R'5\' 3"'Nw$  (1.6 m)   Wt 137 lb (62.1 kg)   SpO2 97%   BMI 24.27 kg/m   General:  NAD. Neck: No JVD or HJR. Lungs: Clear to  auscultation bilaterally without wheezes or crackles. Heart: Regular rate and rhythm without murmurs, rubs, or gallops. Abdomen: Soft, nontender, nondistended. Extremities: Trace ankle edema with bilateral compression stockings in place.   Lab Results  Component Value Date   WBC 9.2 10/12/2020   HGB 9.2 (L) 10/12/2020   HCT 27.1 (L) 10/12/2020   MCV 78.8 (L) 10/12/2020   PLT 367 10/12/2020    Lab Results  Component Value Date   NA 132 (L) 10/12/2020   K 3.1 (L) 10/12/2020   CL 100 10/12/2020   CO2 22 10/12/2020   BUN 17 10/12/2020   CREATININE 1.07 (H) 10/12/2020   GLUCOSE 135 (H) 10/12/2020   ALT 52 (H) 10/12/2020    No results found for: CHOL, HDL, LDLCALC, LDLDIRECT, TRIG, CHOLHDL  --------------------------------------------------------------------------------------------------  ASSESSMENT AND PLAN: Lower extremity edema: This has improved from our prior visit with routine use of compression stockings and escalation of furosemide to 40 mg twice daily.  In an effort to lessen the risk for dehydration and hypokalemia, we have agreed to decrease furosemide  back to 20 mg daily.  If Ms. Rojero notices worsening edema or weight gain, she should go back to 40 mg daily.  Labs yesterday were notable for a mild hypokalemia with a potassium of 3.1.  Ms. Marcus was advised to increase her potassium supplementation by Dr. Tasia Catchings.  She continues to get weekly Bumex.  I will defer ongoing management of electrolyte repletion to Dr. Tasia Catchings.  Multiple myeloma: No evidence of cardiac involvement by MRI.  Continue ongoing treatment through Dr. Tasia Catchings as well as [facility].  Hypertension: Blood pressure mildly elevated today, up from our prior visits.  We discussed escalation of metoprolol but have agreed to defer this for now.  If blood pressure remains elevated, this will need to be considered at follow-up.  I would be reluctant to escalate amlodipine due to issues with lower extremity edema.  ACE  inhibitor's and ARB's are also precluded by history of anaphylaxis with benazepril and lisinopril.  Vaginal spotting: This is new.  I do not believe that it was precipitated by furosemide.  Ms. Barth is scheduled for GYN evaluation including possible endometrial biopsy tomorrow.  Follow-up: Return to clinic in 6 weeks.  Nelva Bush, MD 10/13/2020 3:10 PM

## 2020-10-14 ENCOUNTER — Encounter: Payer: Self-pay | Admitting: Internal Medicine

## 2020-10-14 DIAGNOSIS — R600 Localized edema: Secondary | ICD-10-CM | POA: Insufficient documentation

## 2020-10-15 LAB — MULTIPLE MYELOMA PANEL, SERUM
Albumin SerPl Elph-Mcnc: 2.4 g/dL — ABNORMAL LOW (ref 2.9–4.4)
Albumin/Glob SerPl: 0.9 (ref 0.7–1.7)
Alpha 1: 0.3 g/dL (ref 0.0–0.4)
Alpha2 Glob SerPl Elph-Mcnc: 0.8 g/dL (ref 0.4–1.0)
B-Globulin SerPl Elph-Mcnc: 1.1 g/dL (ref 0.7–1.3)
Gamma Glob SerPl Elph-Mcnc: 0.6 g/dL (ref 0.4–1.8)
Globulin, Total: 2.9 g/dL (ref 2.2–3.9)
IgA: 67 mg/dL (ref 64–422)
IgG (Immunoglobin G), Serum: 564 mg/dL — ABNORMAL LOW (ref 586–1602)
IgM (Immunoglobulin M), Srm: 77 mg/dL (ref 26–217)
M Protein SerPl Elph-Mcnc: 0.4 g/dL — ABNORMAL HIGH
Total Protein ELP: 5.3 g/dL — ABNORMAL LOW (ref 6.0–8.5)

## 2020-10-18 ENCOUNTER — Other Ambulatory Visit: Payer: Self-pay | Admitting: Oncology

## 2020-10-18 ENCOUNTER — Other Ambulatory Visit: Payer: Self-pay | Admitting: Internal Medicine

## 2020-10-20 ENCOUNTER — Other Ambulatory Visit: Payer: Self-pay

## 2020-10-20 ENCOUNTER — Inpatient Hospital Stay: Payer: Medicare Other

## 2020-10-20 ENCOUNTER — Inpatient Hospital Stay: Payer: Medicare Other | Attending: Oncology

## 2020-10-20 ENCOUNTER — Encounter: Payer: Self-pay | Admitting: Oncology

## 2020-10-20 ENCOUNTER — Inpatient Hospital Stay (HOSPITAL_BASED_OUTPATIENT_CLINIC_OR_DEPARTMENT_OTHER): Payer: Medicare Other | Admitting: Oncology

## 2020-10-20 VITALS — BP 134/73 | HR 88 | Temp 98.3°F | Resp 16 | Wt 130.5 lb

## 2020-10-20 DIAGNOSIS — I1310 Hypertensive heart and chronic kidney disease without heart failure, with stage 1 through stage 4 chronic kidney disease, or unspecified chronic kidney disease: Secondary | ICD-10-CM | POA: Diagnosis not present

## 2020-10-20 DIAGNOSIS — R7989 Other specified abnormal findings of blood chemistry: Secondary | ICD-10-CM

## 2020-10-20 DIAGNOSIS — E859 Amyloidosis, unspecified: Secondary | ICD-10-CM

## 2020-10-20 DIAGNOSIS — T451X5A Adverse effect of antineoplastic and immunosuppressive drugs, initial encounter: Secondary | ICD-10-CM

## 2020-10-20 DIAGNOSIS — R110 Nausea: Secondary | ICD-10-CM | POA: Insufficient documentation

## 2020-10-20 DIAGNOSIS — D631 Anemia in chronic kidney disease: Secondary | ICD-10-CM | POA: Insufficient documentation

## 2020-10-20 DIAGNOSIS — F419 Anxiety disorder, unspecified: Secondary | ICD-10-CM | POA: Insufficient documentation

## 2020-10-20 DIAGNOSIS — N1832 Chronic kidney disease, stage 3b: Secondary | ICD-10-CM | POA: Diagnosis not present

## 2020-10-20 DIAGNOSIS — R112 Nausea with vomiting, unspecified: Secondary | ICD-10-CM

## 2020-10-20 DIAGNOSIS — Z79899 Other long term (current) drug therapy: Secondary | ICD-10-CM | POA: Insufficient documentation

## 2020-10-20 DIAGNOSIS — D509 Iron deficiency anemia, unspecified: Secondary | ICD-10-CM | POA: Insufficient documentation

## 2020-10-20 DIAGNOSIS — E8581 Light chain (AL) amyloidosis: Secondary | ICD-10-CM | POA: Diagnosis not present

## 2020-10-20 DIAGNOSIS — N1831 Chronic kidney disease, stage 3a: Secondary | ICD-10-CM | POA: Diagnosis not present

## 2020-10-20 DIAGNOSIS — Z5111 Encounter for antineoplastic chemotherapy: Secondary | ICD-10-CM

## 2020-10-20 DIAGNOSIS — E876 Hypokalemia: Secondary | ICD-10-CM | POA: Insufficient documentation

## 2020-10-20 LAB — COMPREHENSIVE METABOLIC PANEL
ALT: 46 U/L — ABNORMAL HIGH (ref 0–44)
AST: 70 U/L — ABNORMAL HIGH (ref 15–41)
Albumin: 2.1 g/dL — ABNORMAL LOW (ref 3.5–5.0)
Alkaline Phosphatase: 1159 U/L — ABNORMAL HIGH (ref 38–126)
Anion gap: 7 (ref 5–15)
BUN: 16 mg/dL (ref 8–23)
CO2: 24 mmol/L (ref 22–32)
Calcium: 9 mg/dL (ref 8.9–10.3)
Chloride: 103 mmol/L (ref 98–111)
Creatinine, Ser: 1.02 mg/dL — ABNORMAL HIGH (ref 0.44–1.00)
GFR, Estimated: 57 mL/min — ABNORMAL LOW (ref >60)
Glucose, Bld: 141 mg/dL — ABNORMAL HIGH (ref 70–99)
Potassium: 3.5 mmol/L (ref 3.5–5.1)
Sodium: 134 mmol/L — ABNORMAL LOW (ref 135–145)
Total Bilirubin: 4 mg/dL — ABNORMAL HIGH (ref 0.3–1.2)
Total Protein: 5.6 g/dL — ABNORMAL LOW (ref 6.5–8.1)

## 2020-10-20 LAB — CBC WITH DIFFERENTIAL/PLATELET
Abs Immature Granulocytes: 0.05 10*3/uL (ref 0.00–0.07)
Basophils Absolute: 0 10*3/uL (ref 0.0–0.1)
Basophils Relative: 0 %
Eosinophils Absolute: 0.1 10*3/uL (ref 0.0–0.5)
Eosinophils Relative: 1 %
HCT: 26.2 % — ABNORMAL LOW (ref 36.0–46.0)
Hemoglobin: 9.2 g/dL — ABNORMAL LOW (ref 12.0–15.0)
Immature Granulocytes: 1 %
Lymphocytes Relative: 24 %
Lymphs Abs: 1.4 10*3/uL (ref 0.7–4.0)
MCH: 26.4 pg (ref 26.0–34.0)
MCHC: 35.1 g/dL (ref 30.0–36.0)
MCV: 75.3 fL — ABNORMAL LOW (ref 80.0–100.0)
Monocytes Absolute: 0.5 10*3/uL (ref 0.1–1.0)
Monocytes Relative: 9 %
Neutro Abs: 3.7 10*3/uL (ref 1.7–7.7)
Neutrophils Relative %: 65 %
Platelets: 319 10*3/uL (ref 150–400)
RBC: 3.48 MIL/uL — ABNORMAL LOW (ref 3.87–5.11)
RDW: 25.2 % — ABNORMAL HIGH (ref 11.5–15.5)
WBC: 5.7 10*3/uL (ref 4.0–10.5)
nRBC: 0.7 % — ABNORMAL HIGH (ref 0.0–0.2)

## 2020-10-20 MED ORDER — DEXAMETHASONE 4 MG PO TABS: 20.0000 mg | ORAL_TABLET | Freq: Every day | ORAL | 0 refills | Status: DC

## 2020-10-20 NOTE — Progress Notes (Addendum)
Hematology/Oncology  Follow up note [facility] [phone] [phone]   Patient Care Team: Leonel Ramsay, MD as PCP - General (Infectious Diseases) End, [name], MD as PCP - Cardiology (Cardiology)  REFERRING PROVIDER: Leonel Ramsay, MD  CHIEF COMPLAINTS/REASON FOR VISIT:  Follow-up for amyloidosis  HISTORY OF PRESENTING ILLNESS:   Nancy Rodriguez is a  75 y.o.  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  Work up include protein electrophoresis showed M protein 0.7,   Reviewed her previous medical records via care everywhere.  She was seen by Hematology Oncology at [facility] on 02/14/2017.  07/25/2007  SPEP showed M protein of 0.35, IFE showed IgG lamda 09/22/2007  Bone survey negative.  02/15/14 IgG 930, SPEP M protien 0.22, free kappa light chain 3.59, lamda 2.92, ratio 1.23  Hypercalcemia, resolved after stopping HCTZ.  # Transaminitis 03/28/20 US abdomen showed left liver lobe Complex 1.7 cm cyst  # 04/07/20 MRI abdomen w/wo contrast showed 2 benign liver cysts, 2 nonspecific hypovascular irighr liver lesions, abnormal bone marrow signal of L1 vertebral body. Patient was  advised to proceed with bone marrow biopsy and she declined.  # referred her to established care with GI and was seen on 04/19/20,  # Liver biopsy showed amyloidosis. [name] MS/MS showed AL type # 05/20/20 recommend bone marrow biopsy which is scheduled on 05/24/2020.  Patient changed her mind and ask a biopsy to be canceled.  My team and I had multiple phone discussion with patient's daughter [name] and later with [name].  Patient agreed with bone marrow biopsy and a biopsy was scheduled on 06/09/2020.  05/20/20 NT proBNP  was normal,  TSH slightly increased, normal  T4 Normal factor X Normal coags Troponin 19. Refer to cardiology for evaluation. May need cardiac MRI  # 06/01/2020 2D echo result was reviewed and discussed with patient.  Patient has normal LVEF 60-65%. Mild asymmetric left ventricular hypertrophy.  Left ventricular diastolic parameters are indeterminate. average left ventricular global longitudinal strain is -16.8 %.  # 06/09/2020, bone marrow biopsy showed monoclonal plasmacytosis, 8%, amyloid deposit present.  Absent iron stores. 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 t (14;20) #06/17/2020, further discussed about diagnosis and treatment plan.  Patient declined bone marrow transplant evaluation.  Decision was made to proceed with Dara-CyborD chemotherapy treatments. #May 2022, second opinion at [place] with Dr. Tracey Harries.who agrees with current treatment plan.  He recommends to lower dexamethasone to 20 mg and decrease premed Tylenol to 325 mg  09/01/2020 cardiac MRI was performed at [facility].  Left ventricle is normal in size.  Mild to moderate basal septal hypertrophy.  Hyper dynamic LV function.  LVEF 71%.  Right ventricle is normal in size and wall thickness.  Systolic function normal.  Mild bi atrial enlargement, no significant aortic valve stenosis or regurgitation.  Trivial mitral regurgitation, mild tricuspid regurgitation and a trivial pulmonic regurgitation.  No evidence of MI, scarring or infiltration.  #History of major depression listed in outside problem list.  previous diagnosis and treatment details are not available to me in EMR  I have referred her to psychiatrist  #  vaginal bleeding which stopped- she was seen by GYN and recommended for biopsy.   # She has poor IV access  and was not able to receive IV Emend and IV Aloxi. She does not want to have med port.    # 10/08/2020, cervical spine showed cervical spondylosis.  Spinal canal diagnosis and a posterior disc osteophyte complex and  superimposed small central disc protrusion contact the ventral spinal cord at C4-C5.  Multilevel neuroforaminal narrowing. bilateral atlantooccipital joint effusions, multilevel grade 1 spondylolisthesis. MRI lumbar spine without contrast showed acute or subacute fracture of L4.   Moderate spinal stenosis at L3-4 has progressed. Shallow left foraminal disc protrusion has progressed. Moderate subarticular stenosis on the left also with progression  Moderate spinal stenosis L4-5 with progression. Moderate subarticular stenosis on the left with progression  INTERVAL HISTORY Nancy Rodriguez is a 75 y.o. female who has above history reviewed by me today presents for follow up visit for management of AL amyloidosis Problems and complaints are listed below: Patient presents for the evaluation prior to chemotherapy treatment. She feels that right toe drags and "gives out" for 3 weeks. Also some numbness too. No other focal deficit, slur speech, extremity muscle weakness.  Chronic lower extremity edema and she is on Lasix.  She has appt with [facility] hepatologist this month.     Review of Systems  Constitutional:  Positive for fatigue. Negative for appetite change, chills, fever and unexpected weight change.  HENT:   Negative for hearing loss, nosebleeds and voice change.   Eyes:  Negative for eye problems.  Respiratory:  Negative for chest tightness and cough.   Cardiovascular:  Positive for leg swelling. Negative for chest pain.  Gastrointestinal:  Negative for abdominal distention, abdominal pain, blood in stool and nausea.  Endocrine: Negative for hot flashes.  Genitourinary:  Negative for difficulty urinating and frequency.   Musculoskeletal:  Positive for back pain and neck pain. Negative for arthralgias.  Skin:  Negative for rash.  Neurological:  Positive for numbness. Negative for extremity weakness.  Hematological:  Negative for adenopathy.  Psychiatric/Behavioral:  Negative for confusion.     MEDICAL HISTORY:  Past Medical History:  Diagnosis Date   Anemia    Anemia in chronic kidney disease   Chronic kidney disease    Stage 3b chronic kidney disease   Diabetes mellitus without complication (HCC)    Hypercholesterolemia    Hypertension    MGUS (monoclonal gammopathy of unknown significance)    Osteoarthritis    Rheumatoid arthritis ([place])     SURGICAL HISTORY: Past Surgical History:  Procedure Laterality Date   HERNIA REPAIR     PARATHYROIDECTOMY      SOCIAL HISTORY: Social History   Socioeconomic History   Marital status: Widowed    Spouse name: Not on file   Number of children: 6   Years of education: Not on file   Highest education level: Not on file  Occupational History   Not on file  Tobacco Use   Smoking status: Never   Smokeless tobacco: Never  Vaping Use   Vaping Use: Never used  Substance and Sexual Activity   Alcohol use: No   Drug use: Never   Sexual activity: Not on file  Other Topics Concern   Not on file  Social History Narrative   Not on file   Social Determinants of Health   Financial Resource Strain: Not on file  Food Insecurity: Not on file  Transportation Needs: Not on file  Physical Activity: Not on file  Stress: Not on file  Social Connections: Not on file  Intimate Partner Violence: Not on  file    FAMILY HISTORY: Family History  Problem Relation Age of Onset   Diabetes Daughter    Diabetes Son    Dementia Mother    Cancer Father    Esophageal cancer Sister    Brain cancer Brother     ALLERGIES:  is allergic to benazepril, lisinopril, tolmetin, and nsaids.  MEDICATIONS:  Current Outpatient Medications  Medication Sig Dispense Refill   acyclovir (ZOVIRAX) 400 MG tablet TAKE 1 TABLET BY MOUTH TWICE A DAY 60 tablet 5   albuterol (VENTOLIN HFA) 108 (90 Base) MCG/ACT inhaler Inhale 2 puffs into the lungs every 4 (four) hours as needed.     amLODipine (NORVASC) 5 MG tablet Take 5 mg by mouth daily.      cyclophosphamide (CYTOXAN) 50 MG capsule TAKE 10 CAPSULES (500 MG TOTAL) BY MOUTH ONCE A WEEK. TAKE WITH BREAKFAST. 40 capsule 3   docusate sodium (COLACE) 100 MG capsule Take 1 capsule by mouth as needed.     EPINEPHrine 0.3 mg/0.3 mL IJ SOAJ injection Inject 0.3 mg into the muscle as needed.     ferrous sulfate 325 (65 FE) MG tablet Take 1 tablet (325 mg total) by mouth 2 (two) times daily with a meal. 60 tablet 2   furosemide (LASIX) 20 MG tablet TAKE 2 TABLETS BY MOUTH DAILY. TAKE 1 TABLET DAILY FOR 3 DAYS OR UNTIL SWELLING IMPROVES 60 tablet 3   gabapentin (NEURONTIN) 300 MG capsule Take 300 mg by mouth at bedtime.     hydrOXYzine (ATARAX/VISTARIL) 10 MG tablet Take 10 mg by mouth 2 (two) times daily as needed.     insulin glargine (LANTUS SOLOSTAR) 100 UNIT/ML Solostar Pen Inject 17 Units into the skin 2 (two) times daily. Pt takes in morning and bedtime.     LORazepam (ATIVAN) 0.5 MG tablet Take 1 tablet (0.5 mg total) by mouth every 6 (six) hours as needed (nausea vomiting). 30 tablet 0   megestrol (MEGACE) 40 MG/ML suspension TAKE 10 MLS (400 MG TOTAL) BY MOUTH DAILY. 240 mL 0   metoprolol succinate (TOPROL-XL) 50 MG 24 hr tablet TAKE 1 TABLET (50 MG TOTAL) BY MOUTH DAILY IN THE MORNING     mirtazapine (REMERON) 7.5 MG tablet Take 1 tablet (7.5 mg total) by mouth at bedtime. 30 tablet 2   omeprazole (PRILOSEC) 20 MG capsule Take 1 capsule (20 mg total) by mouth daily. 30 capsule 1   ondansetron (ZOFRAN) 8 MG tablet TAKE 8 MG BY MOUTH 30 TO 60 MIN PRIOR TO CYTOXAN ADMINISTRATION THEN TAKE 8 MG TWICE DAILY AS NEEDED FOR NAUSEA AND VOMITING. 30 tablet 1   potassium chloride SA (KLOR-CON) 20 MEQ tablet Take 1 tablet (20 mEq total) by mouth 2 (two) times daily. 60 tablet 1   promethazine (PHENERGAN) 25 MG tablet TAKE 1 TABLET BY MOUTH EVERY 8 HOURS AS NEEDED FOR NAUSEA OR VOMITING. 90 tablet 1   traMADol (ULTRAM) 50 MG tablet Take 1 tablet (50 mg total) by mouth every 6 (six) hours as needed for  severe pain. 8 tablet 0   No current facility-administered medications for this visit.     PHYSICAL EXAMINATION: ECOG PERFORMANCE STATUS: 1 - Symptomatic but completely ambulatory Vitals:   10/20/20 0843  BP: 134/73  Pulse: 88  Resp: 16  Temp: 98.3 F (36.8 C)  SpO2: 100%   Filed Weights   10/20/20 0843  Weight: 130 lb 8.2 oz (59.2 kg)    Physical Exam Constitutional:      General:  She is not in acute distress. HENT:     Head: Normocephalic and atraumatic.  Eyes:     General: No scleral icterus. Cardiovascular:     Rate and Rhythm: Normal rate and regular rhythm.     Heart sounds: Murmur heard.  Pulmonary:     Effort: Pulmonary effort is normal. No respiratory distress.     Breath sounds: No wheezing.  Abdominal:     General: Bowel sounds are normal. There is no distension.     Palpations: Abdomen is soft.  Musculoskeletal:        General: Swelling present. No deformity. Normal range of motion.     Cervical back: Normal range of motion and neck supple.     Comments: Bilateral lower extremity 1+ edema  Skin:    General: Skin is warm and dry.     Findings: No erythema or rash.  Neurological:     Mental Status: She is alert and oriented to person, place, and time. Mental status is at baseline.     Cranial Nerves: No cranial nerve deficit.     Coordination: Coordination normal.  Psychiatric:        Mood and Affect: Mood normal.    LABORATORY DATA:  I have reviewed the data as listed Lab Results  Component Value Date   WBC 5.7 10/20/2020   HGB 9.2 (L) 10/20/2020   HCT 26.2 (L) 10/20/2020   MCV 75.3 (L) 10/20/2020   PLT 319 10/20/2020   Recent Labs    09/28/20 1049 10/05/20 1109 10/12/20 0830  NA 134* 134* 132*  K 3.1* 3.5 3.1*  CL 101 100 100  CO2 $Re'24 24 22  '[place]$ GLUCOSE 418* 350* 135*  BUN 25* 19 17  CREATININE 1.30* 0.98 1.07*  CALCIUM 8.9 9.1 8.9  GFRNONAA 43* 60* 54*  PROT 5.9* 5.8* 6.0*  ALBUMIN 2.3* 2.3* 2.3*  AST 56* 68* 77*  ALT 38 51* 52*   ALKPHOS 1,229* 1,310* 1,411*  BILITOT 3.9* 3.7* 4.4*    Iron/TIBC/Ferritin/ %Sat    Component Value Date/Time   IRON 35 08/31/2020 1037   TIBC 290 08/31/2020 1037   FERRITIN 82 08/31/2020 1037   IRONPCTSAT 12 08/31/2020 1037     08/25/2019, platelet count 491, WBC 7.5, hemoglobin 12 Creatinine 1.58, EGFR 37, calcium 10.8, albumin 4.2 Negative hepatitis B surface antigen, hepatitis B core antibody, Negative hepatitis C 08/05/2019, A1c 11.2   RADIOGRAPHIC STUDIES: I have personally reviewed the radiological images as listed and agreed with the findings in the report. CT ABDOMEN PELVIS WO CONTRAST  Result Date: 09/02/2020 CLINICAL DATA:  Light chain amyloidosis. Weight loss. Abdominal distension. EXAM: CT ABDOMEN AND PELVIS WITHOUT CONTRAST TECHNIQUE: Multidetector CT imaging of the abdomen and pelvis was performed following the standard protocol without IV contrast. COMPARISON:  MRI abdomen 04/07/2020 FINDINGS: Lower chest: Lung bases are clear. Hepatobiliary: No focal hepatic lesion on noncontrast exam. Gallstones present in a collapsed gallbladder. Caudate lobe is prominent. Fluid along the RIGHT hepatic margin. Benign cyst in LEFT hepatic lobe. Pancreas: Pancreas is normal. No ductal dilatation. No pancreatic inflammation. Spleen: Normal spleen Adrenals/urinary tract: Adrenal glands normal. No nephrolithiasis ureterolithiasis. No obstructive uropathy. The density of the urine in the bladder is greater than typical (HU equal 27). No IV contrast administered. Stomach/Bowel: Stomach, small bowel, appendix, and cecum are normal. The colon and rectosigmoid colon are normal. Vascular/Lymphatic: Abdominal aorta is normal caliber with atherosclerotic calcification. There is no retroperitoneal or periportal lymphadenopathy. No pelvic lymphadenopathy. Reproductive: Uterus and  adnexa unremarkable. Other: Moderate volume of free fluid along the margin liver and collecting in the pelvis. Focal of fluid  simple fluid attenuation without organization. Musculoskeletal: No aggressive osseous lesion. IMPRESSION: 1. High-density urine within the bladder. Recommend urinalysis and correlation for cystitis or hematuria. 2. Moderate volume of free fluid in the RIGHT abdomen and pelvis is favored ascites. Recommend correlation with liver function. 3. Cholelithiasis without evidence cholecystitis. These results will be called to the ordering clinician or representative by the Radiologist Assistant, and communication documented in the PACS or [employer]. Electronically Signed   By: Suzy Bouchard M.D.   On: 09/02/2020 09:25   DG Lumbar Spine Complete  Result Date: 09/23/2020 CLINICAL DATA:  Acute low back pain. EXAM: LUMBAR SPINE - COMPLETE 4+ VIEW COMPARISON:  None. FINDINGS: Mildly displaced fracture is seen involving the L4 vertebral body. Mild grade 1 anterolisthesis of L4-5 is noted secondary to posterior facet joint hypertrophy. Mild degenerative disc disease is noted at L1-2, L2-3 and L5-S1 with anterior osteophyte formation. IMPRESSION: Mildly displaced acute fracture is seen involving the L4 vertebral body. These results will be called to the ordering clinician or representative by the Radiologist Assistant, and communication documented in the PACS or [employer]. Aortic Atherosclerosis (ICD10-I70.0). Electronically Signed   By: Marijo Conception M.D.   On: 09/23/2020 14:44   MR CERVICAL SPINE WO CONTRAST  Result Date: 10/10/2020 CLINICAL DATA:  Cervical myelopathy. Additional history provided by scanning technologist: Patient reports fall June 3rd resulting in shoulder dislocation, numbness of left 4/5 fingers, difficulty with movement. EXAM: MRI CERVICAL SPINE WITHOUT CONTRAST TECHNIQUE: Multiplanar, multisequence MR imaging of the cervical spine was performed. No intravenous contrast was administered. COMPARISON:  No pertinent prior exams available for comparison. FINDINGS: Alignment: Reversal of  the expected cervical lordosis. Trace C2-C3 grade 1 anterolisthesis. 2 mm C3-C4 grade 1 anterolisthesis. Trace C5-C6 grade 1 retrolisthesis. Trace C7-T1 grade 1 anterolisthesis. Vertebrae: Vertebral body height is maintained. No significant marrow edema or focal suspicious osseous lesion. Multilevel ventral osteophytes. Cord: No spinal cord signal abnormality is identified. Posterior Fossa, vertebral arteries, paraspinal tissues: Incidentally noted partially empty sella turcica. No abnormality identified within included portions of the posterior fossa. Flow voids preserved within the imaged cervical vertebral arteries. Paraspinal soft tissues within normal limits. Small bilateral atlantooccipital joint effusions. Disc levels: Moderate disc degeneration at C4-C5, C5-C6 and C6-C7. Mild disc degeneration at the remaining levels. C2-C3: Trace grade 1 anterolisthesis. Disc uncovering with tiny right center disc protrusion. Minimal facet arthrosis. No significant spinal canal or foraminal stenosis. C3-C4: Grade 1 anterolisthesis. Disc uncovering with small central disc protrusion. Uncovertebral hypertrophy. The disc protrusion mildly effaces the ventral thecal sac without significant spinal cord mass effect. Bilateral neural foraminal narrowing (mild right, moderate left). C4-C5: Posterior disc osteophyte complex with bilateral disc osteophyte ridge/uncinate hypertrophy. Superimposed small central disc protrusion. Mild spinal canal narrowing with contact upon the ventral spinal cord. Moderate/severe bilateral neural foraminal narrowing (greater on the right). C5-C6: Trace grade 1 retrolisthesis. Disc bulge. Superimposed small central disc extrusion with cranial migration to the mid L5 vertebral body level (series 9, image 8). Right-sided disc osteophyte ridge/uncinate hypertrophy. Mild relative spinal canal narrowing. Severe right neural foraminal narrowing. C6-C7: Disc bulge with bilateral disc osteophyte ridge/uncinate  hypertrophy. Mild spinal canal narrowing. Bilateral neural foraminal narrowing (moderate/severe right, moderate left). C7-T1: Trace grade 1 anterolisthesis. Slight disc uncovering. Superimposed small central disc protrusion. Minimal facet arthrosis minimal partial effacement of the ventral thecal sac without spinal cord  mass effect. No significant foraminal narrowing. T1-T2: Imaged sagittally. Small central disc protrusion. Mild effacement of the ventral thecal sac without appreciable spinal cord mass effect. No significant foraminal stenosis. T2-T3: Trace grade 1 anterolisthesis. No appreciable significant disc herniation or spinal canal stenosis. Facet arthrosis/ligamentum flavum hypertrophy. Mild bilateral neural foraminal narrowing (greater on the right). IMPRESSION: Cervical spondylosis, as outlined. No more than mild spinal canal stenosis. Most notably, a posterior disc osteophyte complex and superimposed small central disc protrusion contact the ventral spinal cord at C4-C5. Multilevel neural foraminal narrowing, as detailed and greatest on the left at C3-C4 (moderate), bilaterally at C4-C5 (moderate/severe) and bilaterally at C6-C7 (moderate/severe right, moderate left). Reversal of the expected cervical lordosis. Mild multilevel grade 1 spondylolisthesis, as above. Small bilateral atlantooccipital joint effusions. Electronically Signed   By: Kellie Simmering DO   On: 10/10/2020 10:42   MR LUMBAR SPINE WO CONTRAST  Result Date: 10/10/2020 CLINICAL DATA:  Closed compression fracture L4 vertebral body. EXAM: MRI LUMBAR SPINE WITHOUT CONTRAST TECHNIQUE: Multiplanar, multisequence MR imaging of the lumbar spine was performed. No intravenous contrast was administered. COMPARISON:  Lumbar radiographs 09/21/2020.  Lumbar MRI 05/11/2020 FINDINGS: Segmentation:  Normal Alignment: Mild retrolisthesis L1-2, L2-3, L3-4. Mild anterolisthesis L4-5 Vertebrae: Moderate compression fracture of L4. No underlying lesion is  suspected. There is mild diffuse bone marrow edema. No significant retropulsion of bone into the canal. Fracture appears to involve the superior and inferior endplates. 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 No additional fracture Conus medullaris and cauda equina: Conus extends to the L2 level. Conus and cauda equina appear normal. Paraspinal and other soft tissues: Negative for paraspinous mass or adenopathy or fluid collection. Mild paraspinous edema around the L4 fracture. Disc levels: T12-L1: Small central disc protrusion unchanged from the prior MRI. No significant stenosis L1-2: Mild retrolisthesis.  Mild disc degeneration without stenosis L2-3: Mild retrolisthesis. Disc bulging and mild facet degeneration. Negative for stenosis L3-4: Disc bulging and diffuse endplate spurring. Shallow left foraminal disc protrusion has progressed. Bilateral facet hypertrophy. Progression of moderate spinal stenosis and moderate subarticular stenosis on the left. L4-5: Mild anterolisthesis. Central disc protrusion unchanged. Mild left foraminal disc protrusion unchanged. Advanced facet degeneration. Moderate spinal stenosis with progression. Moderate subarticular stenosis on the left with progression. L5-S1: Disc degeneration with diffuse endplate spurring. Mild foraminal stenosis bilaterally unchanged. IMPRESSION: 1. Moderately severe acute or subacute fracture of L4 without significant retropulsion of bone into the canal. Large hemangioma L1. No other fracture 2. Moderate spinal stenosis at L3-4 has progressed. Shallow left foraminal disc protrusion has progressed. Moderate subarticular stenosis on the left also with progression 3. Moderate spinal stenosis L4-5 with progression. Moderate subarticular stenosis on the left with progression. Electronically Signed   By: Franchot Gallo M.D.   On: 10/10/2020 10:41   US Abdomen Limited  Result Date: 08/25/2020 CLINICAL DATA:  Ascites. EXAM: LIMITED ABDOMEN  ULTRASOUND FOR ASCITES TECHNIQUE: Limited ultrasound survey for ascites was performed in all four abdominal quadrants. COMPARISON:  None. FINDINGS: 4 quadrant assessment of the abdomen/pelvis demonstrates no intraperitoneal free fluid. Small bilateral pleural effusions noted incidentally. IMPRESSION: No sonographic evidence of ascites. Electronically Signed   By: Misty Stanley M.D.   On: 08/25/2020 09:32   US Venous Img Lower Bilateral  Result Date: 07/27/2020 CLINICAL DATA:  Bilateral lower extremity swelling EXAM: BILATERAL LOWER EXTREMITY VENOUS DOPPLER ULTRASOUND TECHNIQUE: Gray-scale sonography with graded compression, as well as color Doppler and duplex ultrasound were performed to evaluate the lower extremity deep venous  systems from the level of the common femoral vein and including the common femoral, femoral, profunda femoral, popliteal and calf veins including the posterior tibial, peroneal and gastrocnemius veins when visible. The superficial great saphenous vein was also interrogated. Spectral Doppler was utilized to evaluate flow at rest and with distal augmentation maneuvers in the common femoral, femoral and popliteal veins. COMPARISON:  None. FINDINGS: RIGHT LOWER EXTREMITY Common Femoral Vein: No evidence of thrombus. Normal compressibility, respiratory phasicity and response to augmentation. Saphenofemoral Junction: No evidence of thrombus. Normal compressibility and flow on color Doppler imaging. Profunda Femoral Vein: No evidence of thrombus. Normal compressibility and flow on color Doppler imaging. Femoral Vein: No evidence of thrombus. Normal compressibility, respiratory phasicity and response to augmentation. Popliteal Vein: No evidence of thrombus. Normal compressibility, respiratory phasicity and response to augmentation. Calf Veins: No evidence of thrombus. Normal compressibility and flow on color Doppler imaging. Other Findings:  Lower extremity subcutaneous edema noted LEFT LOWER  EXTREMITY Common Femoral Vein: No evidence of thrombus. Normal compressibility, respiratory phasicity and response to augmentation. Saphenofemoral Junction: No evidence of thrombus. Normal compressibility and flow on color Doppler imaging. Profunda Femoral Vein: No evidence of thrombus. Normal compressibility and flow on color Doppler imaging. Femoral Vein: No evidence of thrombus. Normal compressibility, respiratory phasicity and response to augmentation. Popliteal Vein: No evidence of thrombus. Normal compressibility, respiratory phasicity and response to augmentation. Calf Veins: No evidence of thrombus. Normal compressibility and flow on color Doppler imaging. Other Findings:  Lower extremity subcutaneous edema noted IMPRESSION: No evidence of deep venous thrombosis in either lower extremity. Electronically Signed   By: Jerilynn Mages.  Shick M.D.   On: 07/27/2020 14:28   DG Shoulder Left  Result Date: 09/15/2020 CLINICAL DATA:  75 year old female with anterior left shoulder dislocation after fall. Post reduction. EXAM: LEFT SHOULDER - 2+ VIEW COMPARISON:  0907 hours today. FINDINGS: Normal glenohumeral joint alignment now. Proximal left humerus and scapula appear intact. Mild degenerative changes at the left [facility] joint. Left clavicle and visible left ribs appear intact. IMPRESSION: Reduction of the left shoulder dislocation with no fracture identified. Electronically Signed   By: Genevie Ann M.D.   On: 09/15/2020 11:11   DG Shoulder Left  Result Date: 09/15/2020 CLINICAL DATA:  75 year old female status post fall and pain. EXAM: LEFT SHOULDER - 2+ VIEW COMPARISON:  Left shoulder series 11/30/2014. FINDINGS: Anterior glenohumeral dislocation. No acute fracture identified. Left clavicle and scapula appear to remain normally aligned. Visible left ribs appear intact. IMPRESSION: Anterior left glenohumeral dislocation.  No fracture identified. Electronically Signed   By: Genevie Ann M.D.   On: 09/15/2020 09:25       ASSESSMENT  & PLAN:  1. Light chain (AL) amyloidosis (HCC)   2. Encounter for antineoplastic chemotherapy   3. 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[place])   4. Chemotherapy induced nausea and vomiting   5. Elevated LFTs    # AL-amyloidosis Diagnosis of AL amyloidosis was discussed with patient.  Patient has biopsy confirmed liver involvement, possible kidney involvement- Labs reviewed and discussed with patient. Proceed with cycle 4  Day 21 CyBorD- hold velcade.  Hyperbilirubinemia and abnormal liver function. Bilirubin 4 and recommend patient to take 350 mg cyclophosphamide today.Hold Velcade due to severe hepatic impairment She has appointment with hepatologist at [facility]  #Back pain secondary to degenerative disease and spine fracture.  Check bone density. #Chemotherapy-induced nausea, likely secondary to the oral cyclophosphamide.  She did well with her home antiemetics.  Refilled zofran  #  Lack of IV access, discussed about option of Mediport placement.  She declined.  #Hypokalemia, potassium level 3.5.  continue potassium to 68meq in AM  and 61meq in PM .  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  Stable creatinine. Repeat 24-hour urine protein, ordered and she did not do,  Follow up with nephrology  #Anemia is multifactorial, from CKD, marrow suppression and iron deficiency. Microcytic anemia likely due to iron deficiency, continue.  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  Marland Kitchen  #Anxiety, recommend patient to take Remeron-unclear if patient is following the recommendation.  #Unintentional weight loss, symptoms improved after started on Megace.  Continue  #Bilateral lower extremity swelling, neg DVT.  Improved.recommend compression stocking  # right great toe dragging and numbness. 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 On gabapentin.   Recommend her to further discuss with pcp or go to ER if symptoms are worse, concerning for stroke.  Plan was discussed with patient and daughter.  Not able to reach her son [name] over the phone. Supportive care measures are necessary for patient well-being and will be provided as necessary. We spent sufficient time to discuss many aspect of care, questions were answered to patient's satisfaction.  Lab MD cycle 5-day 1 Dara CyBorD  cc Leonel Ramsay, MD   [name], MD, PhD Hematology Oncology [facility] at [facility] Pager- [phone] 10/20/2020

## 2020-10-20 NOTE — Progress Notes (Signed)
Follow up oncology appointment today. Right great toe feels "strange" to patient. Appetite fair.

## 2020-10-21 ENCOUNTER — Other Ambulatory Visit (HOSPITAL_COMMUNITY): Payer: Self-pay

## 2020-10-25 ENCOUNTER — Telehealth: Payer: Self-pay | Admitting: Pharmacist

## 2020-10-25 ENCOUNTER — Encounter: Payer: Self-pay | Admitting: Oncology

## 2020-10-25 ENCOUNTER — Other Ambulatory Visit: Payer: Self-pay | Admitting: Oncology

## 2020-10-25 DIAGNOSIS — E8581 Light chain (AL) amyloidosis: Secondary | ICD-10-CM

## 2020-10-25 NOTE — Telephone Encounter (Addendum)
Oral Oncology Pharmacist Encounter  Received new prescription for Revlimid (lenalidomide) for the treatment of AL lambda amyloidosis in conjunction with daratumumab and dexamethasone, planned duration until disease control or unacceptable drug toxicity.  CMP from 10/27/20 assessed, no relevant lab abnormalities. Prescription dose and frequency assessed.   Current medication list in Epic reviewed, no DDIs with lenalidomide identified.  Evaluated chart and no patient barriers to medication adherence identified.   Prescription has been e-scribed to [employer] for benefits analysis and approval.  Oral Oncology Clinic will continue to follow for insurance authorization, copayment issues, initial counseling and start date.   Darl Pikes, PharmD, BCPS, BCOP, CPP Hematology/Oncology Clinical Pharmacist Practitioner [facility] [place] [facility] [phone]  10/25/2020 4:48 PM

## 2020-10-26 ENCOUNTER — Other Ambulatory Visit (HOSPITAL_COMMUNITY): Payer: Self-pay

## 2020-10-26 ENCOUNTER — Encounter: Payer: Self-pay | Admitting: Oncology

## 2020-10-27 ENCOUNTER — Other Ambulatory Visit: Payer: Self-pay

## 2020-10-27 ENCOUNTER — Inpatient Hospital Stay: Payer: Medicare Other

## 2020-10-27 ENCOUNTER — Inpatient Hospital Stay (HOSPITAL_BASED_OUTPATIENT_CLINIC_OR_DEPARTMENT_OTHER): Payer: Medicare Other | Admitting: Oncology

## 2020-10-27 ENCOUNTER — Telehealth: Payer: Self-pay

## 2020-10-27 ENCOUNTER — Other Ambulatory Visit: Payer: Self-pay | Admitting: Oncology

## 2020-10-27 ENCOUNTER — Other Ambulatory Visit (HOSPITAL_COMMUNITY): Payer: Self-pay

## 2020-10-27 ENCOUNTER — Telehealth: Payer: Self-pay | Admitting: Pharmacy Technician

## 2020-10-27 VITALS — BP 165/78 | HR 101 | Temp 97.4°F | Resp 18 | Wt 134.5 lb

## 2020-10-27 DIAGNOSIS — E8581 Light chain (AL) amyloidosis: Secondary | ICD-10-CM | POA: Diagnosis not present

## 2020-10-27 DIAGNOSIS — N1831 Chronic kidney disease, stage 3a: Secondary | ICD-10-CM

## 2020-10-27 DIAGNOSIS — D631 Anemia in chronic kidney disease: Secondary | ICD-10-CM

## 2020-10-27 DIAGNOSIS — R112 Nausea with vomiting, unspecified: Secondary | ICD-10-CM | POA: Diagnosis not present

## 2020-10-27 DIAGNOSIS — T451X5A Adverse effect of antineoplastic and immunosuppressive drugs, initial encounter: Secondary | ICD-10-CM

## 2020-10-27 DIAGNOSIS — E859 Amyloidosis, unspecified: Secondary | ICD-10-CM

## 2020-10-27 DIAGNOSIS — Z5111 Encounter for antineoplastic chemotherapy: Secondary | ICD-10-CM

## 2020-10-27 LAB — COMPREHENSIVE METABOLIC PANEL
ALT: 45 U/L — ABNORMAL HIGH (ref 0–44)
AST: 58 U/L — ABNORMAL HIGH (ref 15–41)
Albumin: 2.4 g/dL — ABNORMAL LOW (ref 3.5–5.0)
Alkaline Phosphatase: 1117 U/L — ABNORMAL HIGH (ref 38–126)
Anion gap: 7 (ref 5–15)
BUN: 12 mg/dL (ref 8–23)
CO2: 25 mmol/L (ref 22–32)
Calcium: 8.9 mg/dL (ref 8.9–10.3)
Chloride: 102 mmol/L (ref 98–111)
Creatinine, Ser: 0.8 mg/dL (ref 0.44–1.00)
GFR, Estimated: >60 mL/min (ref >60)
Glucose, Bld: 232 mg/dL — ABNORMAL HIGH (ref 70–99)
Potassium: 3 mmol/L — ABNORMAL LOW (ref 3.5–5.1)
Sodium: 134 mmol/L — ABNORMAL LOW (ref 135–145)
Total Bilirubin: 4.8 mg/dL — ABNORMAL HIGH (ref 0.3–1.2)
Total Protein: 6.2 g/dL — ABNORMAL LOW (ref 6.5–8.1)

## 2020-10-27 LAB — CBC WITH DIFFERENTIAL/PLATELET
Abs Immature Granulocytes: 0.03 10*3/uL (ref 0.00–0.07)
Basophils Absolute: 0 10*3/uL (ref 0.0–0.1)
Basophils Relative: 0 %
Eosinophils Absolute: 0 10*3/uL (ref 0.0–0.5)
Eosinophils Relative: 1 %
HCT: 28.1 % — ABNORMAL LOW (ref 36.0–46.0)
Hemoglobin: 9.9 g/dL — ABNORMAL LOW (ref 12.0–15.0)
Immature Granulocytes: 0 %
Lymphocytes Relative: 19 %
Lymphs Abs: 1.3 10*3/uL (ref 0.7–4.0)
MCH: 26.6 pg (ref 26.0–34.0)
MCHC: 35.2 g/dL (ref 30.0–36.0)
MCV: 75.5 fL — ABNORMAL LOW (ref 80.0–100.0)
Monocytes Absolute: 0.5 10*3/uL (ref 0.1–1.0)
Monocytes Relative: 7 %
Neutro Abs: 4.9 10*3/uL (ref 1.7–7.7)
Neutrophils Relative %: 73 %
Platelets: 378 10*3/uL (ref 150–400)
RBC: 3.72 MIL/uL — ABNORMAL LOW (ref 3.87–5.11)
RDW: 25.2 % — ABNORMAL HIGH (ref 11.5–15.5)
WBC: 6.8 10*3/uL (ref 4.0–10.5)
nRBC: 0.3 % — ABNORMAL HIGH (ref 0.0–0.2)

## 2020-10-27 MED ORDER — LENALIDOMIDE 10 MG PO CAPS: 10.0000 mg | ORAL_CAPSULE | Freq: Every day | ORAL | 0 refills | Status: DC

## 2020-10-27 MED ORDER — DIPHENHYDRAMINE HCL 25 MG PO CAPS
50.0000 mg | ORAL_CAPSULE | Freq: Once | ORAL | Status: AC
  Administered 2020-10-27: 50 mg via ORAL
  Filled 2020-10-27: qty 2

## 2020-10-27 MED ORDER — DEXAMETHASONE 4 MG PO TABS
20.0000 mg | ORAL_TABLET | Freq: Once | ORAL | Status: AC
Start: 2020-10-27 — End: 2020-10-27
  Administered 2020-10-27: 20 mg via ORAL
  Filled 2020-10-27: qty 5

## 2020-10-27 MED ORDER — ASPIRIN EC 81 MG PO TBEC: 81.0000 mg | DELAYED_RELEASE_TABLET | Freq: Every day | ORAL | 0 refills | Status: DC

## 2020-10-27 MED ORDER — ACETAMINOPHEN 325 MG PO TABS
325.0000 mg | ORAL_TABLET | Freq: Once | ORAL | Status: AC
  Administered 2020-10-27: 325 mg via ORAL
  Filled 2020-10-27: qty 1

## 2020-10-27 MED ORDER — DEXAMETHASONE 4 MG PO TABS: 20.0000 mg | ORAL_TABLET | ORAL | 0 refills | Status: DC

## 2020-10-27 MED ORDER — DARATUMUMAB-HYALURONIDASE-FIHJ 1800-30000 MG-UT/15ML ~~LOC~~ SOLN
1800.0000 mg | Freq: Once | SUBCUTANEOUS | Status: AC
  Administered 2020-10-27: 1800 mg via SUBCUTANEOUS
  Filled 2020-10-27: qty 15

## 2020-10-27 MED ORDER — POTASSIUM CHLORIDE CRYS ER 20 MEQ PO TBCR: 40.0000 meq | EXTENDED_RELEASE_TABLET | Freq: Two times a day (BID) | ORAL | 0 refills | Status: DC

## 2020-10-27 MED ORDER — OMEPRAZOLE 20 MG PO CPDR
20.0000 mg | DELAYED_RELEASE_CAPSULE | Freq: Every day | ORAL | 1 refills | Status: DC
Start: 2020-10-27 — End: 2021-03-07

## 2020-10-27 NOTE — Progress Notes (Signed)
Pt received subq dara in clinic today. Tolerated well. Observed post injection for 20 min per Dr Tasia Catchings. No complaints at d/c.

## 2020-10-27 NOTE — Telephone Encounter (Signed)
Prescription sent to [facility].

## 2020-10-27 NOTE — Telephone Encounter (Signed)
Oral Oncology Patient Advocate Encounter   Received notification from [employer] that prior authorization for Revlimid is required.   PA submitted on CoverMyMeds Key [id number] Status is pending   Oral Oncology Clinic will continue to follow.  Home Patient Nancy [place] Phone [phone] Fax [phone] 10/27/2020 10:00 AM

## 2020-10-27 NOTE — Addendum Note (Signed)
Addended by: Evelina Dun on: 10/27/2020 01:16 PM   Modules accepted: Orders

## 2020-10-27 NOTE — Telephone Encounter (Signed)
Oral Oncology Patient Advocate Encounter  Prior Authorization for Revlimid has been approved.    PA# [id number] Effective dates: 09/27/20 through 10/27/23  Test claim revealed co-pay is $8.12 (price may vary at other pharmacies).  Oral Oncology Clinic will continue to follow.   [place] Patient [place] Phone [phone] Fax [phone] 10/27/2020 10:03 AM

## 2020-10-27 NOTE — Telephone Encounter (Signed)
Patient was enrolled in [employer] Revlimid program via [employer] portal     Revlimid 10mg , 21 days on, 7 days off  REMs # : [id number]

## 2020-10-27 NOTE — Progress Notes (Signed)
Hematology/Oncology  Follow up note [facility] [phone] [phone]   Patient Care Team: Leonel Ramsay, MD as PCP - General (Infectious Diseases) End, [name], MD as PCP - Cardiology (Cardiology)  REFERRING PROVIDER: Leonel Ramsay, MD  CHIEF COMPLAINTS/REASON FOR VISIT:  Follow-up for amyloidosis  HISTORY OF PRESENTING ILLNESS:   Nancy Rodriguez is a  75 y.o.  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  Work up include protein electrophoresis showed M protein 0.7,   Reviewed her previous medical records via care everywhere.  She was seen by Hematology Oncology at [facility] on 02/14/2017.  07/25/2007  SPEP showed M protein of 0.35, IFE showed IgG lamda 09/22/2007  Bone survey negative.  02/15/14 IgG 930, SPEP M protien 0.22, free kappa light chain 3.59, lamda 2.92, ratio 1.23  Hypercalcemia, resolved after stopping HCTZ.  # Transaminitis 03/28/20 US abdomen showed left liver lobe Complex 1.7 cm cyst  # 04/07/20 MRI abdomen w/wo contrast showed 2 benign liver cysts, 2 nonspecific hypovascular irighr liver lesions, abnormal bone marrow signal of L1 vertebral body. Patient was  advised to proceed with bone marrow biopsy and she declined.  # referred her to established care with GI and was seen on 04/19/20,  # Liver biopsy showed amyloidosis. [name] MS/MS showed AL type # 05/20/20 recommend bone marrow biopsy which is scheduled on 05/24/2020.  Patient changed her mind and ask a biopsy to be canceled.  My team and I had multiple phone discussion with patient's daughter [name] and later with [name].  Patient agreed with bone marrow biopsy and a biopsy was scheduled on 06/09/2020.  05/20/20 NT proBNP  was normal,  TSH slightly increased, normal  T4 Normal factor X Normal coags Troponin 19. Refer to cardiology for evaluation. May need cardiac MRI  # 06/01/2020 2D echo result was reviewed and discussed with patient.  Patient has normal LVEF 60-65%. Mild asymmetric left ventricular hypertrophy.  Left ventricular diastolic parameters are indeterminate. average left ventricular global longitudinal strain is -16.8 %.  # 06/09/2020, bone marrow biopsy showed monoclonal plasmacytosis, 8%, amyloid deposit present.  Absent iron stores. 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 t (14;20) #06/17/2020, further discussed about diagnosis and treatment plan.  Patient declined bone marrow transplant evaluation.  Decision was made to proceed with Dara-CyborD chemotherapy treatments. #May 2022, second opinion at [place] with Dr. Tracey Harries.who agrees with current treatment plan.  He recommends to lower dexamethasone to 20 mg and decrease premed Tylenol to 325 mg  09/01/2020 cardiac MRI was performed at [facility].  Left ventricle is normal in size.  Mild to moderate basal septal hypertrophy.  Hyper dynamic LV function.  LVEF 71%.  Right ventricle is normal in size and wall thickness.  Systolic function normal.  Mild bi atrial enlargement, no significant aortic valve stenosis or regurgitation.  Trivial mitral regurgitation, mild tricuspid regurgitation and a trivial pulmonic regurgitation.  No evidence of MI, scarring or infiltration.  #History of major depression listed in outside problem list.  previous diagnosis and treatment details are not available to me in EMR  I have referred her to psychiatrist  #  vaginal bleeding which stopped- she was seen by GYN and recommended for biopsy.   # She has poor IV access  and was not able to receive IV Emend and IV Aloxi. She does not want to have med port.    # 10/08/2020, cervical spine showed cervical spondylosis.  Spinal canal diagnosis and a posterior disc osteophyte complex and  superimposed small central disc protrusion contact the ventral spinal cord at C4-C5.  Multilevel neuroforaminal narrowing. bilateral atlantooccipital joint effusions, multilevel grade 1 spondylolisthesis. MRI lumbar spine without contrast showed acute or subacute fracture of L4.   Moderate spinal stenosis at L3-4 has progressed. Shallow left foraminal disc protrusion has progressed. Moderate subarticular stenosis on the left also with progression  Moderate spinal stenosis L4-5 with progression. Moderate subarticular stenosis on the left with progression  INTERVAL HISTORY DARIEL Rodriguez is a 75 y.o. female who has above history reviewed by me today presents for follow up visit for management of AL amyloidosis Problems and complaints are listed below: During the interval patient was seen by [facility] oncology Dr.Kang who recommend patient to switch Dara CyBorD to Dara RD Chronic lower extremity edema and she is on Lasix 20 mg daily.     Review of Systems  Constitutional:  Positive for fatigue. Negative for appetite change, chills, fever and unexpected weight change.  HENT:   Negative for hearing loss, nosebleeds and voice change.   Eyes:  Positive for icterus. Negative for eye problems.  Respiratory:  Negative for chest tightness and cough.   Cardiovascular:  Positive for leg swelling. Negative for chest pain.  Gastrointestinal:  Negative for abdominal distention, abdominal pain, blood in stool and nausea.  Endocrine: Negative for hot flashes.  Genitourinary:  Negative for difficulty urinating and frequency.   Musculoskeletal:  Positive for back pain and neck pain. Negative for arthralgias.  Skin:  Negative for rash.       jaundice  Neurological:  Positive for numbness. Negative for extremity weakness.  Hematological:  Negative for adenopathy.  Psychiatric/Behavioral:  Negative for confusion.    MEDICAL HISTORY:  Past Medical History:  Diagnosis Date   Anemia    Anemia in chronic kidney  disease   Chronic kidney disease    Stage 3b chronic kidney disease   Diabetes mellitus without complication (HCC)    Hypercholesterolemia    Hypertension    MGUS (monoclonal gammopathy of unknown significance)    Osteoarthritis    Rheumatoid arthritis ([place])     SURGICAL HISTORY: Past Surgical History:  Procedure Laterality Date   HERNIA REPAIR     PARATHYROIDECTOMY      SOCIAL HISTORY: Social History   Socioeconomic History   Marital status: Widowed    Spouse name: Not on file   Number of children: 6   Years of education: Not on file   Highest education level: Not on file  Occupational History   Not on file  Tobacco Use   Smoking status: Never   Smokeless tobacco: Never  Vaping Use   Vaping Use: Never used  Substance and Sexual Activity   Alcohol use: No   Drug use: Never   Sexual activity: Not on file  Other Topics Concern   Not on file  Social History Narrative   Not on file   Social Determinants of Health   Financial Resource Strain: Not on file  Food Insecurity: Not on file  Transportation Needs: Not on file  Physical Activity: Not on file  Stress: Not on file  Social Connections: Not on file  Intimate Partner Violence: Not on file    FAMILY HISTORY: Family History  Problem Relation  Age of Onset   Diabetes Daughter    Diabetes Son    Dementia Mother    Cancer Father    Esophageal cancer Sister    Brain cancer Brother     ALLERGIES:  is allergic to benazepril, lisinopril, tolmetin, and nsaids.  MEDICATIONS:  Current Outpatient Medications  Medication Sig Dispense Refill   acyclovir (ZOVIRAX) 400 MG tablet TAKE 1 TABLET BY MOUTH TWICE A DAY 60 tablet 5   albuterol (VENTOLIN HFA) 108 (90 Base) MCG/ACT inhaler Inhale 2 puffs into the lungs every 4 (four) hours as needed.     amLODipine (NORVASC) 5 MG tablet Take 5 mg by mouth daily.     cephALEXin (KEFLEX) 500 MG capsule Take 500 mg by mouth 3 (three) times daily.     docusate sodium (COLACE)  100 MG capsule Take 1 capsule by mouth daily.     EPINEPHrine 0.3 mg/0.3 mL IJ SOAJ injection Inject 0.3 mg into the muscle as needed.     ferrous sulfate 325 (65 FE) MG tablet Take 1 tablet (325 mg total) by mouth 2 (two) times daily with a meal. 60 tablet 2   furosemide (LASIX) 20 MG tablet TAKE 2 TABLETS BY MOUTH DAILY. TAKE 1 TABLET DAILY FOR 3 DAYS OR UNTIL SWELLING IMPROVES (Patient taking differently: One tablet daily) 60 tablet 3   gabapentin (NEURONTIN) 300 MG capsule Take 300 mg by mouth at bedtime.     hydrOXYzine (ATARAX/VISTARIL) 10 MG tablet Take 10 mg by mouth 2 (two) times daily as needed.     insulin glargine (LANTUS SOLOSTAR) 100 UNIT/ML Solostar Pen Inject 17 Units into the skin 2 (two) times daily. Pt takes in morning and bedtime.     LORazepam (ATIVAN) 0.5 MG tablet Take 1 tablet (0.5 mg total) by mouth every 6 (six) hours as needed (nausea vomiting). 30 tablet 0   megestrol (MEGACE) 40 MG/ML suspension TAKE 10 MLS (400 MG TOTAL) BY MOUTH DAILY. 240 mL 0   metoprolol succinate (TOPROL-XL) 50 MG 24 hr tablet TAKE 1 TABLET (50 MG TOTAL) BY MOUTH DAILY IN THE MORNING     mirtazapine (REMERON) 7.5 MG tablet Take 1 tablet (7.5 mg total) by mouth at bedtime. 30 tablet 2   ondansetron (ZOFRAN) 8 MG tablet TAKE 8 MG BY MOUTH 30 TO 60 MIN PRIOR TO CYTOXAN ADMINISTRATION THEN TAKE 8 MG TWICE DAILY AS NEEDED FOR NAUSEA AND VOMITING. 30 tablet 1   ondansetron (ZOFRAN) 8 MG tablet Take by mouth.     promethazine (PHENERGAN) 25 MG tablet TAKE 1 TABLET BY MOUTH EVERY 8 HOURS AS NEEDED FOR NAUSEA AND VOMITING 90 tablet 1   traMADol (ULTRAM) 50 MG tablet Take 1 tablet (50 mg total) by mouth every 6 (six) hours as needed for severe pain. 8 tablet 0   dexamethasone (DECADRON) 4 MG tablet Take 5 tablets (20 mg total) by mouth once a week. 60 tablet 0   lenalidomide (REVLIMID) 10 MG capsule Take 1 capsule (10 mg total) by mouth daily. Take for 21 days, then hold for 7 days. Repeat every 28 days. 21  capsule 0   omeprazole (PRILOSEC) 20 MG capsule Take 1 capsule (20 mg total) by mouth daily. 30 capsule 1   potassium chloride SA (KLOR-CON) 20 MEQ tablet Take 2 tablets (40 mEq total) by mouth 2 (two) times daily. 120 tablet 0   No current facility-administered medications for this visit.     PHYSICAL EXAMINATION: ECOG PERFORMANCE STATUS: 1 - Symptomatic but  completely ambulatory Vitals:   10/27/20 0853  BP: (!) 165/78  Pulse: (!) 101  Resp: 18  Temp: (!) 97.4 F (36.3 C)  SpO2: 99%   Filed Weights   10/27/20 0853  Weight: 134 lb 7.7 oz (61 kg)    Physical Exam Constitutional:      General: She is not in acute distress. HENT:     Head: Normocephalic and atraumatic.  Eyes:     General: No scleral icterus. Cardiovascular:     Rate and Rhythm: Normal rate and regular rhythm.     Heart sounds: Murmur heard.  Pulmonary:     Effort: Pulmonary effort is normal. No respiratory distress.     Breath sounds: No wheezing.  Abdominal:     General: Bowel sounds are normal. There is no distension.     Palpations: Abdomen is soft.  Musculoskeletal:        General: Swelling present. No deformity. Normal range of motion.     Cervical back: Normal range of motion and neck supple.     Comments: Bilateral lower extremity 1+ edema  Skin:    General: Skin is warm and dry.     Findings: No erythema or rash.  Neurological:     Mental Status: She is alert and oriented to person, place, and time. Mental status is at baseline.     Cranial Nerves: No cranial nerve deficit.     Coordination: Coordination normal.  Psychiatric:        Mood and Affect: Mood normal.    LABORATORY DATA:  I have reviewed the data as listed Lab Results  Component Value Date   WBC 6.8 10/27/2020   HGB 9.9 (L) 10/27/2020   HCT 28.1 (L) 10/27/2020   MCV 75.5 (L) 10/27/2020   PLT 378 10/27/2020   Recent Labs    10/12/20 0830 10/20/20 0831 10/27/20 0807  NA 132* 134* 134*  K 3.1* 3.5 3.0*  CL 100 103  102  CO2 _0 GLUCOSE 135* 141* 232*  BUN _1 CREATININE 1.07* 1.02* 0.80  CALCIUM 8.9 9.0 8.9  GFRNONAA 54* 57* >60  PROT 6.0* 5.6* 6.2*  ALBUMIN 2.3* 2.1* 2.4*  AST 77* 70* 58*  ALT 52* 46* 45*  ALKPHOS 1,411* 1,159* 1,117*  BILITOT 4.4* 4.0* 4.8*    Iron/TIBC/Ferritin/ %Sat    Component Value Date/Time   IRON 35 08/31/2020 1037   TIBC 290 08/31/2020 1037   FERRITIN 82 08/31/2020 1037   IRONPCTSAT 12 08/31/2020 1037     08/25/2019, platelet count 491, WBC 7.5, hemoglobin 12 Creatinine 1.58, EGFR 37, calcium 10.8, albumin 4.2 Negative hepatitis B surface antigen, hepatitis B core antibody, Negative hepatitis C 08/05/2019, A1c 11.2   RADIOGRAPHIC STUDIES: I have personally reviewed the radiological images as listed and agreed with the findings in the report. CT ABDOMEN PELVIS WO CONTRAST  Result Date: 09/02/2020 CLINICAL DATA:  Light chain amyloidosis. Weight loss. Abdominal distension. EXAM: CT ABDOMEN AND PELVIS WITHOUT CONTRAST TECHNIQUE: Multidetector CT imaging of the abdomen and pelvis was performed following the standard protocol without IV contrast. COMPARISON:  MRI abdomen 04/07/2020 FINDINGS: Lower chest: Lung bases are clear. Hepatobiliary: No focal hepatic lesion on noncontrast exam. Gallstones present in a collapsed gallbladder. Caudate lobe is prominent. Fluid along the RIGHT hepatic margin. Benign cyst in LEFT hepatic lobe. Pancreas: Pancreas is normal. No ductal dilatation. No pancreatic inflammation. Spleen: Normal spleen Adrenals/urinary tract: Adrenal glands normal. No nephrolithiasis ureterolithiasis. No obstructive uropathy. The  density of the urine in the bladder is greater than typical (HU equal 27). No IV contrast administered. Stomach/Bowel: Stomach, small bowel, appendix, and cecum are normal. The colon and rectosigmoid colon are normal. Vascular/Lymphatic: Abdominal aorta is normal caliber with atherosclerotic calcification. There is no  retroperitoneal or periportal lymphadenopathy. No pelvic lymphadenopathy. Reproductive: Uterus and adnexa unremarkable. Other: Moderate volume of free fluid along the margin liver and collecting in the pelvis. Focal of fluid simple fluid attenuation without organization. Musculoskeletal: No aggressive osseous lesion. IMPRESSION: 1. High-density urine within the bladder. Recommend urinalysis and correlation for cystitis or hematuria. 2. Moderate volume of free fluid in the RIGHT abdomen and pelvis is favored ascites. Recommend correlation with liver function. 3. Cholelithiasis without evidence cholecystitis. These results will be called to the ordering clinician or representative by the Radiologist Assistant, and communication documented in the PACS or [employer]. Electronically Signed   By: Suzy Bouchard M.D.   On: 09/02/2020 09:25   DG Lumbar Spine Complete  Result Date: 09/23/2020 CLINICAL DATA:  Acute low back pain. EXAM: LUMBAR SPINE - COMPLETE 4+ VIEW COMPARISON:  None. FINDINGS: Mildly displaced fracture is seen involving the L4 vertebral body. Mild grade 1 anterolisthesis of L4-5 is noted secondary to posterior facet joint hypertrophy. Mild degenerative disc disease is noted at L1-2, L2-3 and L5-S1 with anterior osteophyte formation. IMPRESSION: Mildly displaced acute fracture is seen involving the L4 vertebral body. These results will be called to the ordering clinician or representative by the Radiologist Assistant, and communication documented in the PACS or [employer]. Aortic Atherosclerosis (ICD10-I70.0). Electronically Signed   By: Marijo Conception M.D.   On: 09/23/2020 14:44   MR CERVICAL SPINE WO CONTRAST  Result Date: 10/10/2020 CLINICAL DATA:  Cervical myelopathy. Additional history provided by scanning technologist: Patient reports fall June 3rd resulting in shoulder dislocation, numbness of left 4/5 fingers, difficulty with movement. EXAM: MRI CERVICAL SPINE WITHOUT CONTRAST  TECHNIQUE: Multiplanar, multisequence MR imaging of the cervical spine was performed. No intravenous contrast was administered. COMPARISON:  No pertinent prior exams available for comparison. FINDINGS: Alignment: Reversal of the expected cervical lordosis. Trace C2-C3 grade 1 anterolisthesis. 2 mm C3-C4 grade 1 anterolisthesis. Trace C5-C6 grade 1 retrolisthesis. Trace C7-T1 grade 1 anterolisthesis. Vertebrae: Vertebral body height is maintained. No significant marrow edema or focal suspicious osseous lesion. Multilevel ventral osteophytes. Cord: No spinal cord signal abnormality is identified. Posterior Fossa, vertebral arteries, paraspinal tissues: Incidentally noted partially empty sella turcica. No abnormality identified within included portions of the posterior fossa. Flow voids preserved within the imaged cervical vertebral arteries. Paraspinal soft tissues within normal limits. Small bilateral atlantooccipital joint effusions. Disc levels: Moderate disc degeneration at C4-C5, C5-C6 and C6-C7. Mild disc degeneration at the remaining levels. C2-C3: Trace grade 1 anterolisthesis. Disc uncovering with tiny right center disc protrusion. Minimal facet arthrosis. No significant spinal canal or foraminal stenosis. C3-C4: Grade 1 anterolisthesis. Disc uncovering with small central disc protrusion. Uncovertebral hypertrophy. The disc protrusion mildly effaces the ventral thecal sac without significant spinal cord mass effect. Bilateral neural foraminal narrowing (mild right, moderate left). C4-C5: Posterior disc osteophyte complex with bilateral disc osteophyte ridge/uncinate hypertrophy. Superimposed small central disc protrusion. Mild spinal canal narrowing with contact upon the ventral spinal cord. Moderate/severe bilateral neural foraminal narrowing (greater on the right). C5-C6: Trace grade 1 retrolisthesis. Disc bulge. Superimposed small central disc extrusion with cranial migration to the mid L5 vertebral body  level (series 9, image 8). Right-sided disc osteophyte ridge/uncinate hypertrophy. Mild relative  spinal canal narrowing. Severe right neural foraminal narrowing. C6-C7: Disc bulge with bilateral disc osteophyte ridge/uncinate hypertrophy. Mild spinal canal narrowing. Bilateral neural foraminal narrowing (moderate/severe right, moderate left). C7-T1: Trace grade 1 anterolisthesis. Slight disc uncovering. Superimposed small central disc protrusion. Minimal facet arthrosis minimal partial effacement of the ventral thecal sac without spinal cord mass effect. No significant foraminal narrowing. T1-T2: Imaged sagittally. Small central disc protrusion. Mild effacement of the ventral thecal sac without appreciable spinal cord mass effect. No significant foraminal stenosis. T2-T3: Trace grade 1 anterolisthesis. No appreciable significant disc herniation or spinal canal stenosis. Facet arthrosis/ligamentum flavum hypertrophy. Mild bilateral neural foraminal narrowing (greater on the right). IMPRESSION: Cervical spondylosis, as outlined. No more than mild spinal canal stenosis. Most notably, a posterior disc osteophyte complex and superimposed small central disc protrusion contact the ventral spinal cord at C4-C5. Multilevel neural foraminal narrowing, as detailed and greatest on the left at C3-C4 (moderate), bilaterally at C4-C5 (moderate/severe) and bilaterally at C6-C7 (moderate/severe right, moderate left). Reversal of the expected cervical lordosis. Mild multilevel grade 1 spondylolisthesis, as above. Small bilateral atlantooccipital joint effusions. Electronically Signed   By: Kellie Simmering DO   On: 10/10/2020 10:42   MR LUMBAR SPINE WO CONTRAST  Result Date: 10/10/2020 CLINICAL DATA:  Closed compression fracture L4 vertebral body. EXAM: MRI LUMBAR SPINE WITHOUT CONTRAST TECHNIQUE: Multiplanar, multisequence MR imaging of the lumbar spine was performed. No intravenous contrast was administered. COMPARISON:  Lumbar  radiographs 09/21/2020.  Lumbar MRI 05/11/2020 FINDINGS: Segmentation:  Normal Alignment: Mild retrolisthesis L1-2, L2-3, L3-4. Mild anterolisthesis L4-5 Vertebrae: Moderate compression fracture of L4. No underlying lesion is suspected. There is mild diffuse bone marrow edema. No significant retropulsion of bone into the canal. Fracture appears to involve the superior and inferior endplates. 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 No additional fracture Conus medullaris and cauda equina: Conus extends to the L2 level. Conus and cauda equina appear normal. Paraspinal and other soft tissues: Negative for paraspinous mass or adenopathy or fluid collection. Mild paraspinous edema around the L4 fracture. Disc levels: T12-L1: Small central disc protrusion unchanged from the prior MRI. No significant stenosis L1-2: Mild retrolisthesis.  Mild disc degeneration without stenosis L2-3: Mild retrolisthesis. Disc bulging and mild facet degeneration. Negative for stenosis L3-4: Disc bulging and diffuse endplate spurring. Shallow left foraminal disc protrusion has progressed. Bilateral facet hypertrophy. Progression of moderate spinal stenosis and moderate subarticular stenosis on the left. L4-5: Mild anterolisthesis. Central disc protrusion unchanged. Mild left foraminal disc protrusion unchanged. Advanced facet degeneration. Moderate spinal stenosis with progression. Moderate subarticular stenosis on the left with progression. L5-S1: Disc degeneration with diffuse endplate spurring. Mild foraminal stenosis bilaterally unchanged. IMPRESSION: 1. Moderately severe acute or subacute fracture of L4 without significant retropulsion of bone into the canal. Large hemangioma L1. No other fracture 2. Moderate spinal stenosis at L3-4 has progressed. Shallow left foraminal disc protrusion has progressed. Moderate subarticular stenosis on the left also with progression 3. Moderate spinal stenosis L4-5 with progression. Moderate  subarticular stenosis on the left with progression. Electronically Signed   By: Franchot Gallo M.D.   On: 10/10/2020 10:41   US Abdomen Limited  Result Date: 08/25/2020 CLINICAL DATA:  Ascites. EXAM: LIMITED ABDOMEN ULTRASOUND FOR ASCITES TECHNIQUE: Limited ultrasound survey for ascites was performed in all four abdominal quadrants. COMPARISON:  None. FINDINGS: 4 quadrant assessment of the abdomen/pelvis demonstrates no intraperitoneal free fluid. Small bilateral pleural effusions noted incidentally. IMPRESSION: No sonographic evidence of ascites. Electronically Signed   By: Randall Hiss  Tery Sanfilippo M.D.   On: 08/25/2020 09:32   DG Shoulder Left  Result Date: 09/15/2020 CLINICAL DATA:  75 year old female with anterior left shoulder dislocation after fall. Post reduction. EXAM: LEFT SHOULDER - 2+ VIEW COMPARISON:  0907 hours today. FINDINGS: Normal glenohumeral joint alignment now. Proximal left humerus and scapula appear intact. Mild degenerative changes at the left [facility] joint. Left clavicle and visible left ribs appear intact. IMPRESSION: Reduction of the left shoulder dislocation with no fracture identified. Electronically Signed   By: Genevie Ann M.D.   On: 09/15/2020 11:11   DG Shoulder Left  Result Date: 09/15/2020 CLINICAL DATA:  75 year old female status post fall and pain. EXAM: LEFT SHOULDER - 2+ VIEW COMPARISON:  Left shoulder series 11/30/2014. FINDINGS: Anterior glenohumeral dislocation. No acute fracture identified. Left clavicle and scapula appear to remain normally aligned. Visible left ribs appear intact. IMPRESSION: Anterior left glenohumeral dislocation.  No fracture identified. Electronically Signed   By: Genevie Ann M.D.   On: 09/15/2020 09:25       ASSESSMENT & PLAN:  1. Light chain (AL) amyloidosis (HCC)   2. Encounter for antineoplastic chemotherapy   3. 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[place])   4. Chemotherapy induced nausea and vomiting    # AL-amyloidosis Liver involvement,  possible kidney involvement- Labs are reviewed and discussed with patient. Velcade has been held for 2 weeks due to progressively worsening liver function. I discussed with Dr.Kang and we both agree upon switching to dara-Rd regimen given that her M protein has not significantly decreased despite treatment.  Light chain seems to have responded to the treatment I will down instructions for patient and also discussed with both patient and her son [name].  Discontinue cyclophosphamide, discontinue Velcade. Labs reviewed and discussed with her.  Proceed with daratumumab today. Hopefully to start Revlimid 25m 3 weeks on 1 weeks off next week. Side effects were discussed in details. Start oral Dexamethasone 25mweekly.  Start Asprin 8122mRx sent.    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   #Chemotherapy-induced nausea, likely secondary to the oral cyclophosphamide.  Cyclophosphamide discontinued.  Continue PRN antiemetics.  #Lack of IV access, discussed about option of Mediport placement.  She declined.  #Hypokalemia due to lasix, potassium level 3.0.  increase potassium to 64m52mwice daily .  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  Stable creatinine. Repeat 24-hour urine protein, ordered and she did not do,  Follow up with nephrology  #Anemia is multifactorial, from CKD, marrow suppression and iron deficiency. Microcytic anemia likely due to iron deficiency, continue.  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  .  #Marland Kitchennxiety, recommend patient to take Remeron-unclear if patient is following the recommendation.  #Unintentional weight loss, symptoms improved after started on Megace.  Continue  #Bilateral lower extremity swelling, neg DVT.  Improved.recommend compression stocking   # right great toe  dragging and numbness. 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 Continue  gabapentin.   We spent sufficient time to discuss many aspect of care, questions were answered to patient's satisfaction.  Lab MD 2 weeks, Dara  cc FitzLeonel Ramsay   ZhouEarlie Server, PhD Hematology Oncology [facility]- [id number]

## 2020-10-27 NOTE — Patient Instructions (Signed)
[place]   Discharge Instructions: Thank you for choosing [facility] to provide your oncology and hematology care.  If you have a lab appointment with the [facility], please go directly to the [facility] and check in at the registration area.  Wear comfortable clothing and clothing appropriate for easy access to any Portacath or PICC line.   We strive to give you quality time with your provider. You may need to reschedule your appointment if you arrive late (15 or more minutes).  Arriving late affects you and other patients whose appointments are after yours.  Also, if you miss three or more appointments without notifying the office, you may be dismissed from the clinic at the provider's discretion.      For prescription refill requests, have your pharmacy contact our office and allow 72 hours for refills to be completed.    Today you received the following chemotherapy and/or immunotherapy agents - Darzalex      To help prevent nausea and vomiting after your treatment, we encourage you to take your nausea medication as directed.  BELOW ARE SYMPTOMS THAT SHOULD BE REPORTED IMMEDIATELY: *FEVER GREATER THAN 100.4 F (38 C) OR HIGHER *CHILLS OR SWEATING *NAUSEA AND VOMITING THAT IS NOT CONTROLLED WITH YOUR NAUSEA MEDICATION *UNUSUAL SHORTNESS OF BREATH *UNUSUAL BRUISING OR BLEEDING *URINARY PROBLEMS (pain or burning when urinating, or frequent urination) *BOWEL PROBLEMS (unusual diarrhea, constipation, pain near the anus) TENDERNESS IN MOUTH AND THROAT WITH OR WITHOUT PRESENCE OF ULCERS (sore throat, sores in mouth, or a toothache) UNUSUAL RASH, SWELLING OR PAIN  UNUSUAL VAGINAL DISCHARGE OR ITCHING   Items with * indicate a potential emergency and should be followed up as soon as possible or go to the Emergency Department if any problems should occur.  Please show the CHEMOTHERAPY ALERT CARD or IMMUNOTHERAPY ALERT CARD at check-in to the  Emergency Department and triage nurse.  Should you have questions after your visit or need to cancel or reschedule your appointment, please contact [facility]  [phone] and follow the prompts.  Office hours are 8:00 a.m. to 4:30 p.m. Monday - Friday. Please note that voicemails left after 4:00 p.m. may not be returned until the following business day.  We are closed weekends and major holidays. You have access to a nurse at all times for urgent questions. Please call the main number to the clinic [phone] and follow the prompts.  For any non-urgent questions, you may also contact your provider using MyChart. We now offer e-Visits for anyone 30 and older to request care online for non-urgent symptoms. For details visit [email or web address].   Also download the MyChart app! Go to the app store, search "MyChart", open the app, select [facility], and log in with your MyChart username and password.  Due to Covid, a mask is required upon entering the hospital/clinic. If you do not have a mask, one will be given to you upon arrival. For doctor visits, patients may have 1 support person aged 27 or older with them. For treatment visits, patients cannot have anyone with them due to current Covid guidelines and our immunocompromised population.   Diphenhydramine Capsules or Tablets What is this medication? DIPHENHYDRAMINE (dye fen HYE dra meen) treats the symptoms of allergies and allergic reactions. It may also be used to prevent and treat motion sickness or symptoms of Parkinson disease. It works by blocking histamine, a substance released by the body during an allergic reaction. It belongs  to a group ofmedications called antihistamines. This medicine may be used for other purposes; ask your health care provider orpharmacist if you have questions. COMMON BRAND NAME(S): Alka-Seltzer Plus Allergy, Aller-G-Time, Banophen, Benadryl Allergy, Benadryl Allergy Dye Free, Benadryl  Allergy Kapgel, Benadryl Allergy Ultratab, Diphedryl, Diphenhist, Genahist, Geri-Dryl, PHARBEDRYL,Q-Dryl, Sleepinal, Valu-Dryl, Vicks ZzzQuil Nightime Sleep-Aid What should I tell my care team before I take this medication? They need to know if you have any of these conditions: Asthma or lung disease Glaucoma High blood pressure or heart disease Liver disease Pain or difficulty passing urine Prostate trouble Ulcers or other stomach problems An unusual or allergic reaction to diphenhydramine, other medications foods, dyes, or preservatives such as sulfites Pregnant or trying to get pregnant Breast-feeding How should I use this medication? Take this medication by mouth with a full glass of water. Follow the directions on the prescription label. Take your doses at regular intervals. Do not take your medication more often than directed. To prevent motion sickness starttaking this medication 30 to 60 minutes before you leave. Talk to your care team regarding the use of this medication in children.Special care may be needed. Patients over 71 years old may have a stronger reaction and need a smaller dose. Overdosage: If you think you have taken too much of this medicine contact apoison control center or emergency room at once. NOTE: This medicine is only for you. Do not share this medicine with others. What if I miss a dose? If you miss a dose, take it as soon as you can. If it is almost time for yournext dose, take only that dose. Do not take double or extra doses. What may interact with this medication? Do not take this medication with any of the following: MAOIs like Carbex, Eldepryl, Marplan, Nardil, and Parnate This medication may also interact with the following: Alcohol Barbiturates, like phenobarbital Medications for bladder spasm like oxybutynin, tolterodine Medications for blood pressure Medications for depression, anxiety, or psychotic disturbances Medications for movement  abnormalities or Parkinson's disease Medications for sleep Other medications for cold, cough or allergy Some medications for the stomach like chlordiazepoxide, dicyclomine This list may not describe all possible interactions. Give your health care provider a list of all the medicines, herbs, non-prescription drugs, or dietary supplements you use. Also tell them if you smoke, drink alcohol, or use illegaldrugs. Some items may interact with your medicine. What should I watch for while using this medication? Visit your care team for regular check-ups. Tell your care team if yoursymptoms do not improve or if they get worse. Your mouth may get dry. Chewing sugarless gum or sucking hard candy, and drinking plenty of water may help. Contact your care team if the problem doesnot go away or is severe. This medication may cause dry eyes and blurred vision. If you wear contact lenses you may feel some discomfort. Lubricating drops may help. See your eyedoctor if the problem does not go away or is severe. You may get drowsy or dizzy. Do not drive, use machinery, or do anything that needs mental alertness until you know how this medication affects you. Do not stand or sit up quickly, especially if you are an older patient. This reduces the risk of dizzy or fainting spells. Alcohol may interfere with the effect ofthis medication. Avoid alcoholic drinks. What side effects may I notice from receiving this medication? Side effects that you should report to your care team as soon as possible: Allergic reactions-skin rash, itching, hives, swelling of the face,  lips, tongue, or throat Sudden eye pain or change in vision such as blurry vision, seeing halos around lights, vision loss Trouble passing urine Side effects that usually do not require medical attention (report to your careteam if they continue or are bothersome): Constipation Drowsiness Dry mouth Headache Upset stomach This list may not describe all  possible side effects. Call your doctor for medical advice about side effects. You may report side effects to FDA at1-800-FDA-1088. Where should I keep my medication? Keep out of the reach of children and pets. This medication can be abused. Keepyour medication in a safe place. Store at room temperature between 15 and 30 degrees C (59 and 86 degrees F). Keep container closed tightly. Throw away any unused medication after theexpiration date. NOTE: This sheet is a summary. It may not cover all possible information. If you have questions about this medicine, talk to your doctor, pharmacist, orhealth care provider.  2022 Elsevier/Gold Standard (2020-04-29 11:53:52)  Dexamethasone tablets What is this medication? DEXAMETHASONE (dex a METH a sone) is a corticosteroid. It is commonly used to treat inflammation of the skin, joints, lungs, and other organs. Common conditions treated include asthma, allergies, and arthritis. It is also used for other conditions, such as blood disorders and diseases of the adrenalglands. This medicine may be used for other purposes; ask your health care provider orpharmacist if you have questions. COMMON BRAND NAME(S): CUSHINGS SYNDROME DIAGNOSTIC, Decadron, Dexabliss, DexPak Jr TaperPak, DexPak TaperPak, Dxevo, Hemady, HiDex, TaperDex, ZCORT, Zema-Pak,ZoDex, ZonaCort 11 Day, ZonaCort 7 Day What should I tell my care team before I take this medication? They need to know if you have any of these conditions: Cushing's syndrome diabetes glaucoma heart disease high blood pressure infection like herpes, measles, tuberculosis, or chickenpox kidney disease liver disease mental illness myasthenia gravis osteoporosis previous heart attack seizures stomach or intestine problems thyroid disease an unusual or allergic reaction to dexamethasone, corticosteroids, other medicines, lactose, foods, dyes, or preservatives pregnant or trying to get pregnant breast-feeding How  should I use this medication? Take this medicine by mouth with a drink of water. Follow the directions on the prescription label. Take it with food or milk to avoid stomach upset. If you are taking this medicine once a day, take it in the morning. Do not take more medicine than you are told to take. Do not suddenly stop taking your medicine because you may develop a severe reaction. Your doctor will tell you how much medicine to take. If your doctor wants you to stop the medicine, the dose maybe slowly lowered over time to avoid any side effects. Talk to your pediatrician regarding the use of this medicine in children.Special care may be needed. Patients over 44 years old may have a stronger reaction and need a smaller dose. Overdosage: If you think you have taken too much of this medicine contact apoison control center or emergency room at once. NOTE: This medicine is only for you. Do not share this medicine with others. What if I miss a dose? If you miss a dose, take it as soon as you can. If it is almost time for your next dose, talk to your doctor or health care professional. You may need to miss a dose or take an extra dose. Do not take double or extra doses withoutadvice. What may interact with this medication? Do not take this medicine with any of the following medications: live virus vaccines This medicine may also interact with the following medications: aminoglutethimide amphotericin B aspirin  and aspirin-like medicines certain antibiotics like erythromycin, clarithromycin, and troleandomycin certain antivirals for HIV or hepatitis certain medicines for seizures like carbamazepine, phenobarbital, phenytoin certain medicines to treat myasthenia gravis cholestyramine cyclosporine digoxin diuretics ephedrine female hormones, like estrogen or progestins and birth control pills insulin or other medicines for diabetes isoniazid ketoconazole medicines that relax muscles for  surgery mifepristone NSAIDs, medicines for pain and inflammation, like ibuprofen or naproxen rifampin skin tests for allergies thalidomide vaccines warfarin This list may not describe all possible interactions. Give your health care provider a list of all the medicines, herbs, non-prescription drugs, or dietary supplements you use. Also tell them if you smoke, drink alcohol, or use illegaldrugs. Some items may interact with your medicine. What should I watch for while using this medication? Visit your health care professional for regular checks on your progress. Tell your health care professional if your symptoms do not start to get better or if they get worse. Your condition will be monitored carefully while you arereceiving this medicine. Wear a medical ID bracelet or chain. Carry a card that describes your diseaseand details of your medicine and dosage times. This medicine may increase your risk of getting an infection. Call your health care professional for advice if you get a fever, chills, or sore throat, or other symptoms of a cold or flu. Do not treat yourself. Try to avoid being around people who are sick. Call your health care professional if you are around anyone with measles, chickenpox, or if you develop sores or blistersthat do not heal properly. If you are going to need surgery or other procedures, tell your doctor or health care professional that you have taken this medicine within the last 79months. Ask your doctor or health care professional about your diet. You may need tolower the amount of salt you eat. This medicine may increase blood sugar. Ask your healthcare provider if changesin diet or medicines are needed if you have diabetes. What side effects may I notice from receiving this medication? Side effects that you should report to your doctor or health care professionalas soon as possible: allergic reactions like skin rash, itching or hives, swelling of the face, lips, or  tongue bloody or black, tarry stools changes in emotions or moods changes in vision confusion, excitement, restlessness depressed mood eye pain hallucinations fever or chills, cough, sore throat, pain or difficulty passing urine muscle weakness severe or sudden stomach or belly pain signs and symptoms of high blood sugar such as being more thirsty or hungry or having to urinate more than normal. You may also feel very tired or have blurry vision. signs and symptoms of infection like fever; chills; cough; sore throat; pain or trouble passing urine swelling of ankles, feet unusual bruising or bleeding wounds that do not heal Side effects that usually do not require medical attention (report to yourdoctor or health care professional if they continue or are bothersome): increased appetite increased growth of face or body hair headache nausea, vomiting skin problems, acne, thin and shiny skin trouble sleeping weight gain This list may not describe all possible side effects. Call your doctor for medical advice about side effects. You may report side effects to FDA at1-800-FDA-1088. Where should I keep my medication? Keep out of the reach of children. Store at room temperature between 20 and 25 degrees C (68 and 77 degrees F).Protect from light. Throw away any unused medicine after the expiration date. NOTE: This sheet is a summary. It may not cover all possible information.  If you have questions about this medicine, talk to your doctor, pharmacist, orhealth care provider.  2022 Elsevier/Gold Standard (2018-10-14 14:23:34)  Acetaminophen Capsules or Tablets What is this medication? ACETAMINOPHEN (a set a MEE noe fen) treats mild to moderate pain. It may alsobe used to reduce fever. This medicine may be used for other purposes; ask your health care provider orpharmacist if you have questions. COMMON BRAND NAME(S): Aceta, Actamin, Anacin Aspirin Free, Genapap, Genebs, Mapap, Pain & Fever,  Pain and Fever, PAIN RELIEF, PAIN RELIEF Extra Strength, Pain Reliever, Panadol, PHARBETOL, Plus PHARMA, Q-Pap, Q-Pap Extra Strength, Tylenol, Tylenol CrushableTablet, Tylenol Extra Strength, Tylenol RegularStrength, XS No Aspirin, XS Pain Reliever What should I tell my care team before I take this medication? They need to know if you have any of these conditions: If you often drink alcohol Liver disease An unusual or allergic reaction to acetaminophen, other medications, foods, dyes, or preservatives Pregnant or trying to get pregnant Breast-feeding How should I use this medication? Take this medication by mouth with a glass of water. Follow the directions on the package or prescription label. Take your medication at regular intervals.Do not take your medication more often than directed. Talk to your care team about the use of this medication in children. While this medication may be prescribed for children as young as 78 years of age forselected conditions, precautions do apply. Overdosage: If you think you have taken too much of this medicine contact apoison control center or emergency room at once. NOTE: This medicine is only for you. Do not share this medicine with others. What if I miss a dose? If you miss a dose, take it as soon as you can. If it is almost time for yournext dose, take only that dose. Do not take double or extra doses. What may interact with this medication? Alcohol Imatinib Isoniazid Other medications with acetaminophen This list may not describe all possible interactions. Give your health care provider a list of all the medicines, herbs, non-prescription drugs, or dietary supplements you use. Also tell them if you smoke, drink alcohol, or use illegaldrugs. Some items may interact with your medicine. What should I watch for while using this medication? Tell your care team if the pain lasts more than 10 days (5 days for children), if it gets worse, or if there is a new or  different kind of pain. Also, checkwith your care team if a fever lasts for more than 3 days. Do not take other medications that contain acetaminophen with this one. Alwaysread labels carefully. If you have questions, ask your care team. If you take too much acetaminophen, get medical help right away. Too much acetaminophen can be very dangerous and cause liver damage. Even if you do nothave symptoms, it is important to get help right away. What side effects may I notice from receiving this medication? Side effects that you should report to your care team as soon as possible: Allergic reactions-skin rash, itching, hives, swelling of the face, lips, tongue, or throat Liver injury-right upper belly pain, loss of appetite, nausea, light-colored stool, dark yellow or brown urine, yellowing skin or eyes, unusual weakness or fatigue Redness, blistering, peeling, or loosening of the skin, including inside the mouth Side effects that usually do not require medical attention (report to your careteam if they continue or are bothersome): Headache Nausea Trouble sleeping Upset stomach This list may not describe all possible side effects. Call your doctor for medical advice about side effects. You may report side  effects to FDA at1-800-FDA-1088. Where should I keep my medication? Keep out of reach of children and pets. Store at room temperature between 20 and 25 degrees C (68 and 77 degrees F). Protect from moisture and heat. Throw away any unused medication after theexpiration date. NOTE: This sheet is a summary. It may not cover all possible information. If you have questions about this medicine, talk to your doctor, pharmacist, orhealth care provider.  2022 Elsevier/Gold Standard (2020-02-16 11:07:55)  Daratumumab injection What is this medication? DARATUMUMAB (dar a toom ue mab) is a monoclonal antibody. It is used to treatmultiple myeloma. This medicine may be used for other purposes; ask your health  care provider orpharmacist if you have questions. COMMON BRAND NAME(S): DARZALEX What should I tell my care team before I take this medication? They need to know if you have any of these conditions: hereditary fructose intolerance infection (especially a virus infection such as chickenpox, herpes, or hepatitis B virus) lung or breathing disease (asthma, COPD) an unusual or allergic reaction to daratumumab, sorbitol, other medicines, foods, dyes, or preservatives pregnant or trying to get pregnant breast-feeding How should I use this medication? This medicine is for infusion into a vein. It is given by a health careprofessional in a hospital or clinic setting. Talk to your pediatrician regarding the use of this medicine in children.Special care may be needed. Overdosage: If you think you have taken too much of this medicine contact apoison control center or emergency room at once. NOTE: This medicine is only for you. Do not share this medicine with others. What if I miss a dose? Keep appointments for follow-up doses as directed. It is important not to miss your dose. Call your doctor or health care professional if you are unable tokeep an appointment. What may interact with this medication? Interactions have not been studied. This list may not describe all possible interactions. Give your health care provider a list of all the medicines, herbs, non-prescription drugs, or dietary supplements you use. Also tell them if you smoke, drink alcohol, or use illegaldrugs. Some items may interact with your medicine. What should I watch for while using this medication? Your condition will be monitored carefully while you are receiving thismedicine. This medicine can cause serious allergic reactions. To reduce your risk, your health care provider may give you other medicine to take before receiving thisone. Be sure to follow the directions from your health care provider. This medicine can affect the  results of blood tests to match your blood type. These changes can last for up to 6 months after the final dose. Your healthcare provider will do blood tests to match your blood type before you start treatment. Tell all of your healthcare providers that you are being treatedwith this medicine before receiving a blood transfusion. This medicine can affect the results of some tests used to determine treatmentresponse; extra tests may be needed to evaluate response. Do not become pregnant while taking this medicine or for 3 months after stopping it. Women should inform their health care provider if they wish to become pregnant or think they might be pregnant. There is a potential for serious side effects to an unborn child. Talk to your health care provider formore information. Do not breast-feed an infant while taking this medicine. What side effects may I notice from receiving this medication? Side effects that you should report to your doctor or health care professionalas soon as possible: allergic reactions (skin rash, itching, hives, swelling of the face, lips,  or tongue) blurred vision infection (fever, chills, cough, sore throat, pain or difficulty passing urine) infusion reaction (dizziness, fast heartbeat, feeling faint or lightheaded, falls, headache, increase in blood pressure, nausea, vomiting, or wheezing or trouble breathing with loud or whistling sounds) unusual bleeding or bruising Side effects that usually do not require medical attention (report to yourdoctor or health care professional if they continue or are bothersome): constipation diarrhea pain, tingling, numbness in the hands or feet swelling of the ankles, feet, hands tiredness This list may not describe all possible side effects. Call your doctor for medical advice about side effects. You may report side effects to FDA at1-800-FDA-1088. Where should I keep my medication? This drug is given in a hospital or clinic and will not  be stored at home. NOTE: This sheet is a summary. It may not cover all possible information. If you have questions about this medicine, talk to your doctor, pharmacist, orhealth care provider.  2022 Elsevier/Gold Standard (2020-05-12 12:50:38)

## 2020-10-31 ENCOUNTER — Ambulatory Visit
Admission: RE | Admit: 2020-10-31 | Discharge: 2020-10-31 | Disposition: A | Discharge status: Home / Self Care | Payer: Medicare Other | Source: Ambulatory Visit | Attending: Oncology | Admitting: Oncology

## 2020-10-31 ENCOUNTER — Other Ambulatory Visit: Payer: Self-pay

## 2020-10-31 IMAGING — US US ABDOMEN LIMITED
1 series · 15 of 25 positions shown · non-contrast
Comparison: CT of the abdomen and pelvis without contrast
[DATE]. Right upper quadrant ultrasound [DATE].

CLINICAL DATA: Hyperbilirubinemia.

EXAM:
ULTRASOUND ABDOMEN LIMITED RIGHT UPPER QUADRANT

[Series 1: us abdomen limited ruq · 15 of 39 slices shown]
[im 1/39]
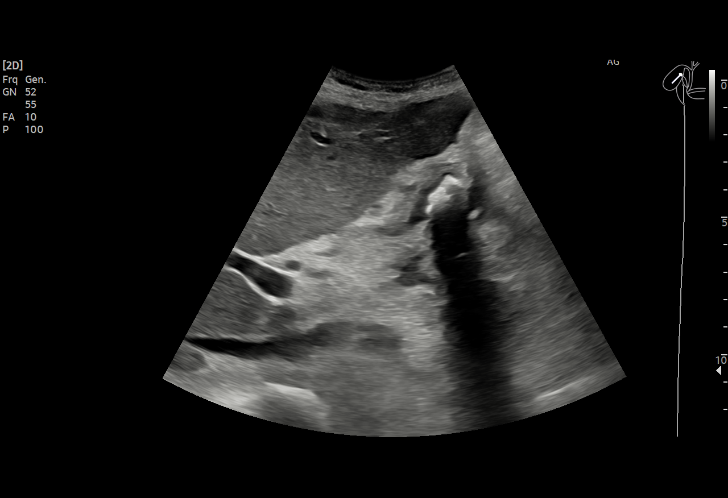
[im 4/39]
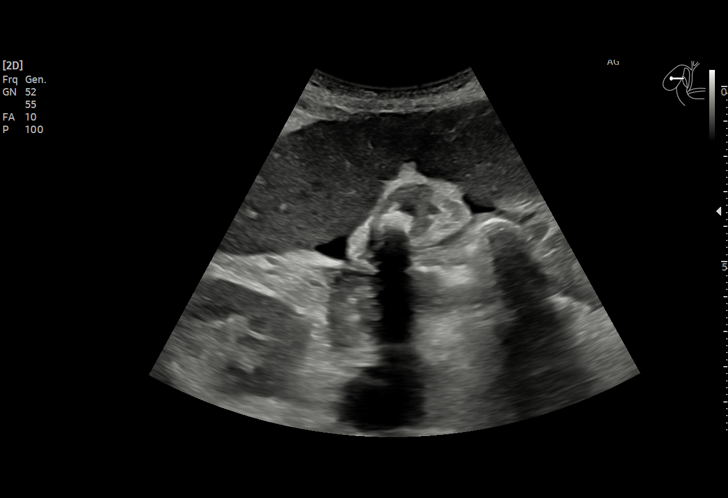
[im 7/39]
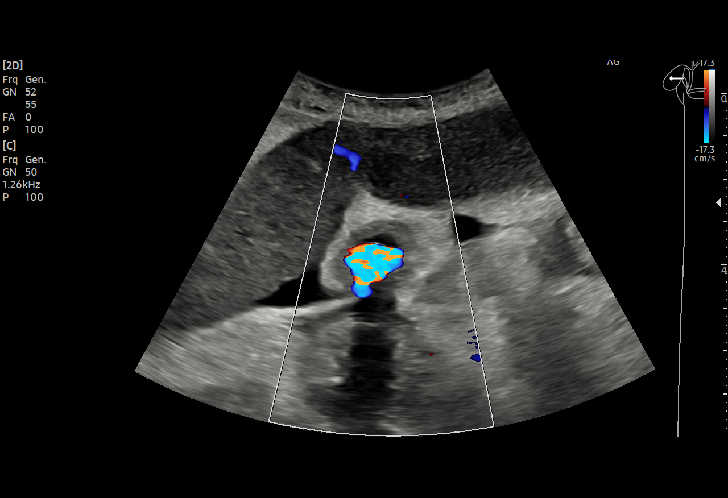
[im 8/39]
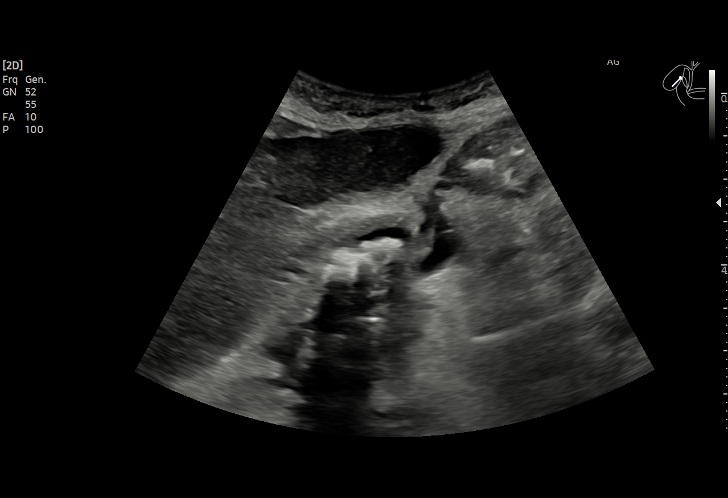
[im 12/39]
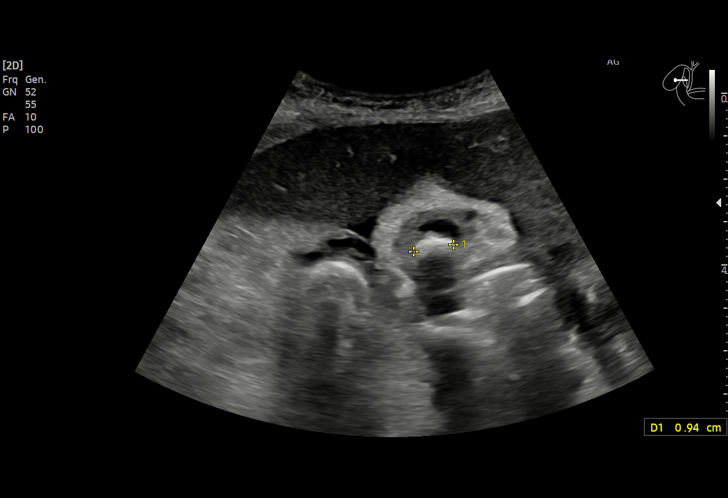
[im 15/39]
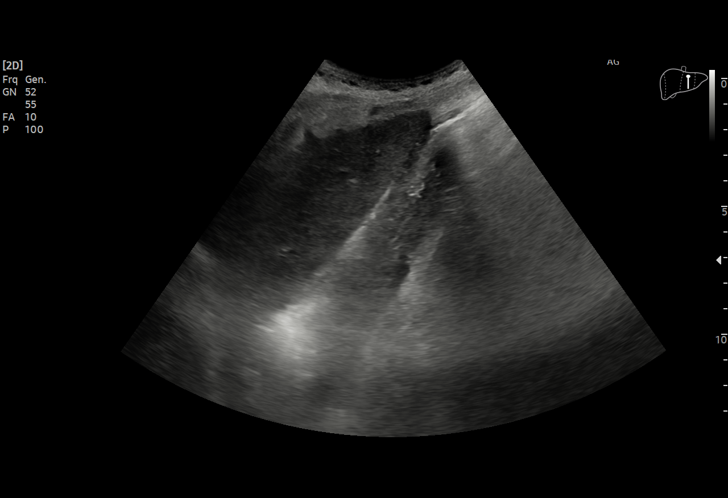
[im 16/39]
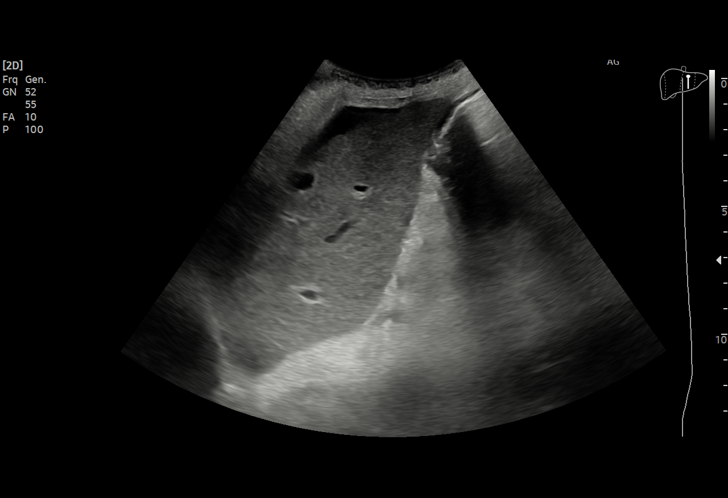
[im 20/39]
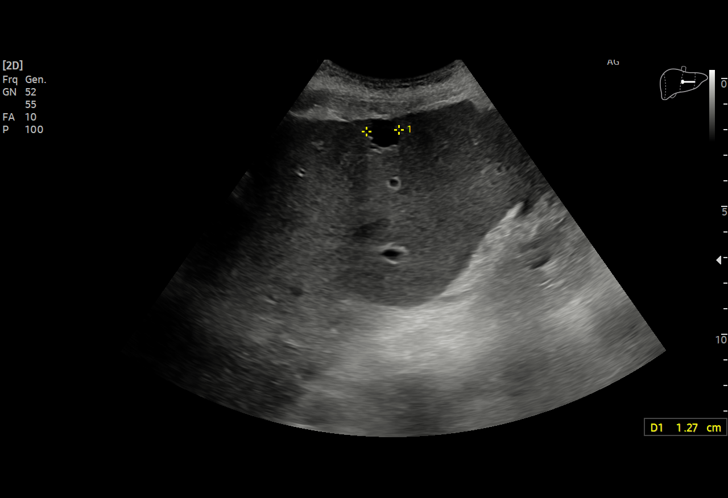
[im 23/39]
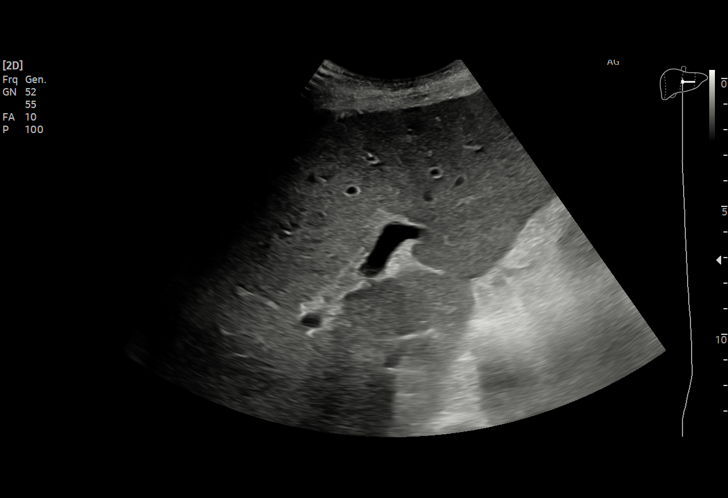
[im 24/39]
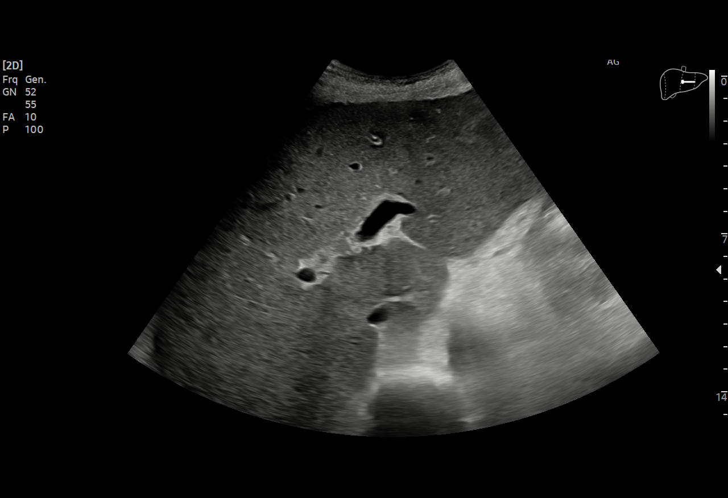
[im 27/39]
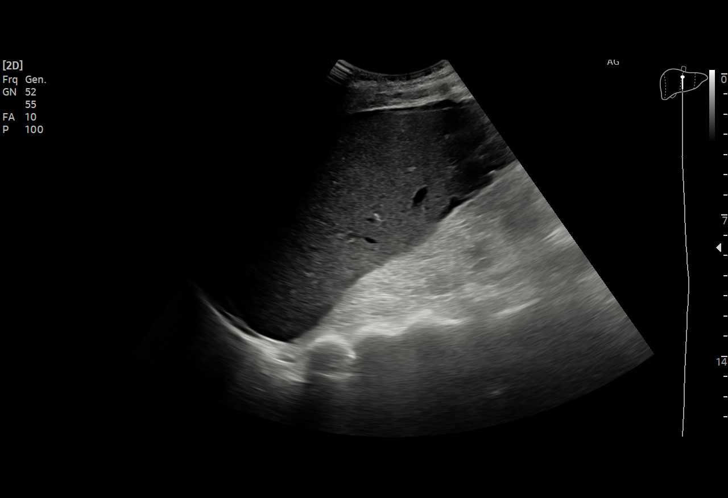
[im 31/39]
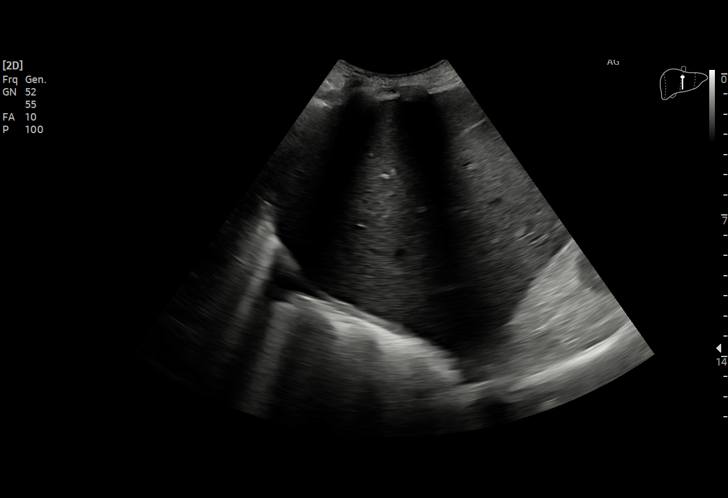
[im 32/39]
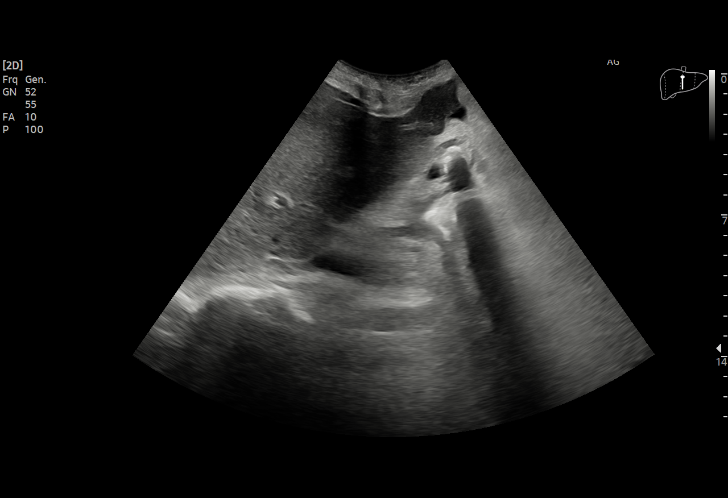
[im 35/39]
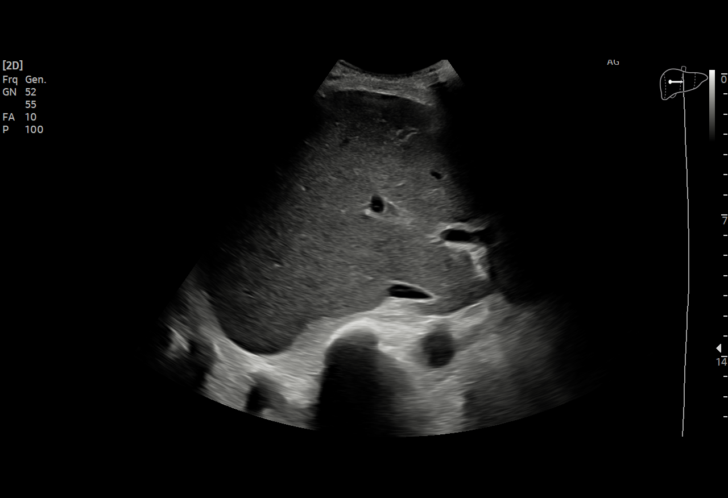
[im 39/39]
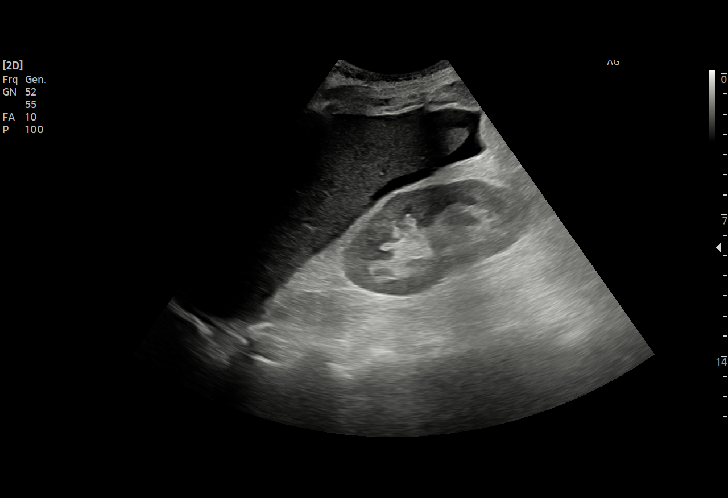

[15 of 25 positions shown; findings below may reference images not displayed]

FINDINGS: Gallbladder:

Large gallstones scratched at large shadowing gallstones noted.
These measure up to 9 mm. Gallbladder is contracted. Wall thickness
measures 5 mm, likely due to contraction. No sonographic Murphy sign
is present.

Common bile duct:

Diameter: 4 mm

Liver:

Simple cyst the left lobe measures up to 1.4 cm. Hepatic parenchyma
within normal limits. Portal vein is patent on color Doppler imaging
with normal direction of blood flow towards the liver.

Other: Small amount of ascites is noted.
IMPRESSION: 1. Cholelithiasis without evidence for acute cholecystitis.
2. Small amount of ascites.
3. Normal sonographic appearance of the liver.  Stable cyst.

## 2020-11-01 ENCOUNTER — Telehealth: Payer: Self-pay | Admitting: *Deleted

## 2020-11-01 NOTE — Telephone Encounter (Signed)
Dr. Tasia Catchings is there anything urgent that needs to be relayed to pt or will this be discussed at her visit on Thursday.

## 2020-11-01 NOTE — Telephone Encounter (Signed)
Patient would like the results from her ultra sound.

## 2020-11-01 NOTE — Telephone Encounter (Signed)
Delivery scheduled for 11/02/20.

## 2020-11-02 ENCOUNTER — Encounter: Payer: Self-pay | Admitting: Oncology

## 2020-11-03 ENCOUNTER — Inpatient Hospital Stay: Payer: Medicare Other | Attending: Oncology | Admitting: Pharmacist

## 2020-11-03 ENCOUNTER — Other Ambulatory Visit: Payer: Self-pay | Admitting: Oncology

## 2020-11-03 DIAGNOSIS — Z79899 Other long term (current) drug therapy: Secondary | ICD-10-CM | POA: Diagnosis not present

## 2020-11-03 DIAGNOSIS — D631 Anemia in chronic kidney disease: Secondary | ICD-10-CM | POA: Diagnosis not present

## 2020-11-03 DIAGNOSIS — N1832 Chronic kidney disease, stage 3b: Secondary | ICD-10-CM | POA: Insufficient documentation

## 2020-11-03 DIAGNOSIS — E8581 Light chain (AL) amyloidosis: Secondary | ICD-10-CM | POA: Diagnosis not present

## 2020-11-03 DIAGNOSIS — E873 Alkalosis: Secondary | ICD-10-CM | POA: Insufficient documentation

## 2020-11-03 DIAGNOSIS — E859 Amyloidosis, unspecified: Secondary | ICD-10-CM

## 2020-11-03 DIAGNOSIS — I1310 Hypertensive heart and chronic kidney disease without heart failure, with stage 1 through stage 4 chronic kidney disease, or unspecified chronic kidney disease: Secondary | ICD-10-CM | POA: Diagnosis not present

## 2020-11-03 DIAGNOSIS — D509 Iron deficiency anemia, unspecified: Secondary | ICD-10-CM | POA: Diagnosis not present

## 2020-11-03 NOTE — Progress Notes (Signed)
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 Pt is starting Revlimid (lenalidomide) today, 11/03/20. She will continue her daratumumab and dexamethasone.   Reason for Consult: Revlimid (lenalidomide) oral chemotherapy education.   PAST MEDICAL HISTORY: Past Medical History:  Diagnosis Date   Anemia    Anemia in chronic kidney disease   Chronic kidney disease    Stage 3b chronic kidney disease   Diabetes mellitus without complication (HCC)    Hypercholesterolemia    Hypertension    MGUS (monoclonal gammopathy of unknown significance)    Osteoarthritis    Rheumatoid arthritis ([place])     HEMATOLOGY/ONCOLOGY HISTORY:  Oncology History   No history exists.    ALLERGIES:  is allergic to benazepril, lisinopril, tolmetin, and nsaids.  MEDICATIONS:  Current Outpatient Medications  Medication Sig Dispense Refill   acyclovir (ZOVIRAX) 400 MG tablet TAKE 1 TABLET BY MOUTH TWICE A DAY 60 tablet 5   albuterol (VENTOLIN HFA) 108 (90 Base) MCG/ACT inhaler Inhale 2 puffs into the lungs every 4 (four) hours as needed.     amLODipine (NORVASC) 5 MG tablet Take 5 mg by mouth daily.     aspirin EC 81 MG tablet Take 1 tablet (81 mg total) by mouth daily. Swallow whole. 30 tablet 0   cephALEXin (KEFLEX) 500 MG capsule Take 500 mg by mouth 3 (three) times daily.     dexamethasone (DECADRON) 4 MG tablet Take 5 tablets (20 mg total) by mouth once a week. 60 tablet 0   docusate sodium (COLACE) 100 MG capsule Take 1  capsule by mouth daily.     EPINEPHrine 0.3 mg/0.3 mL IJ SOAJ injection Inject 0.3 mg into the muscle as needed.     ferrous sulfate 325 (65 FE) MG tablet Take 1 tablet (325 mg total) by mouth 2 (two) times daily with a meal. 60 tablet 2   furosemide (LASIX) 20 MG tablet TAKE 2 TABLETS BY MOUTH DAILY. TAKE 1 TABLET DAILY FOR 3 DAYS OR UNTIL SWELLING IMPROVES (Patient taking differently: One tablet daily) 60 tablet 3   gabapentin (NEURONTIN) 300 MG capsule Take 300 mg by mouth at bedtime.     hydrOXYzine (ATARAX/VISTARIL) 10 MG tablet Take 10 mg by mouth 2 (two) times daily as needed.     insulin glargine (LANTUS SOLOSTAR) 100 UNIT/ML Solostar Pen Inject 17 Units into the skin 2 (two) times daily. Pt takes in morning and bedtime.     KLOR-CON M20 20 MEQ tablet TAKE 2 TABLETS BY MOUTH 2 TIMES DAILY. 120 tablet 0   lenalidomide (REVLIMID) 10 MG capsule Take 1 capsule (10 mg total) by mouth daily. Take for 21 days, then hold for 7 days. Repeat every 28 days. 21 capsule 0   LORazepam (ATIVAN) 0.5 MG tablet Take 1 tablet (0.5 mg total) by mouth every 6 (six) hours as needed (nausea vomiting). 30 tablet 0   megestrol (MEGACE) 40 MG/ML suspension TAKE 10 MLS (400 MG TOTAL) BY MOUTH DAILY. 240 mL 0   metoprolol succinate (TOPROL-XL) 50 MG 24 hr tablet TAKE 1  TABLET (50 MG TOTAL) BY MOUTH DAILY IN THE MORNING     mirtazapine (REMERON) 7.5 MG tablet Take 1 tablet (7.5 mg total) by mouth at bedtime. 30 tablet 2   omeprazole (PRILOSEC) 20 MG capsule Take 1 capsule (20 mg total) by mouth daily. 30 capsule 1   ondansetron (ZOFRAN) 8 MG tablet TAKE 8 MG BY MOUTH 30 TO 60 MIN PRIOR TO CYTOXAN ADMINISTRATION THEN TAKE 8 MG TWICE DAILY AS NEEDED FOR NAUSEA AND VOMITING. 30 tablet 1   ondansetron (ZOFRAN) 8 MG tablet Take by mouth.     promethazine (PHENERGAN) 25 MG tablet TAKE 1 TABLET BY MOUTH EVERY 8 HOURS AS NEEDED FOR NAUSEA AND VOMITING 90 tablet 1   traMADol (ULTRAM) 50 MG tablet Take 1 tablet (50 mg total) by  mouth every 6 (six) hours as needed for severe pain. 8 tablet 0   No current facility-administered medications for this visit.    VITAL SIGNS: There were no vitals taken for this visit. There were no vitals filed for this visit.  Estimated body mass index is 23.82 kg/m as calculated from the following:   Height as of 10/13/20: 5\' 3"  (1.6 m).   Weight as of 10/27/20: 61 kg (134 lb 7.7 oz).  LABS: CBC:    Component Value Date/Time   WBC 6.8 10/27/2020 0807   HGB 9.9 (L) 10/27/2020 0807   HCT 28.1 (L) 10/27/2020 0807   PLT 378 10/27/2020 0807   MCV 75.5 (L) 10/27/2020 0807   NEUTROABS 4.9 10/27/2020 0807   LYMPHSABS 1.3 10/27/2020 0807   MONOABS 0.5 10/27/2020 0807   EOSABS 0.0 10/27/2020 0807   BASOSABS 0.0 10/27/2020 0807   Comprehensive Metabolic Panel:    Component Value Date/Time   NA 134 (L) 10/27/2020 0807   K 3.0 (L) 10/27/2020 0807   CL 102 10/27/2020 0807   CO2 25 10/27/2020 0807   BUN 12 10/27/2020 0807   CREATININE 0.80 10/27/2020 0807   GLUCOSE 232 (H) 10/27/2020 0807   CALCIUM 8.9 10/27/2020 0807   AST 58 (H) 10/27/2020 0807   ALT 45 (H) 10/27/2020 0807   ALKPHOS 1,117 (H) 10/27/2020 0807   BILITOT 4.8 (H) 10/27/2020 0807   PROT 6.2 (L) 10/27/2020 0807   ALBUMIN 2.4 (L) 10/27/2020 0807     Present during today's visit: Patient and daughter, [name]  Assessment and Plan: Start plan: Revlimid (lenalidomide) 10 mg by mouth daily for 14 days, then hold for 7 days. Pt will be starting on a shortened cycle in order to remain on similar schedule with infusion. With next cycle of Revlimid (lenalidomide), pt will take 10 mg by mouth daily for 21 days, then hold for 7 days. Repeat every 28 days. Pt was provided a calendar to aid in adherence to treatment regimen.    Patient Education I spoke with patient for overview of new oral chemotherapy medication: Revlimid (lenalidomide) for the treatment of AL lambda amyloidosis, planned duration until disease progression or  unacceptable drug toxicity.   Administration: Counseled patient on administration, dosing, side effects, monitoring, drug-food interactions, safe handling, storage, and disposal. Patient will take Revlimid (lenalidomide) 10 mg by mouth daily for 21 days, followed by 7 days off. Repeat every 28 days.  She reports already taking her aspirin 81mg  daily.  Side Effects: Side effects include but not limited to: nausea, vomiting, diarrhea, constipation, and fatigue.    Adherence: After discussion with patient no patient barriers to medication adherence identified.  Reviewed with patient importance of  keeping a medication schedule and plan for any missed doses. Patient was provided a medication calendar during appt. Pt understands that this cycle will only be 14 days due to following infusion schedule.   Ms. Hank voiced understanding and appreciation. All questions answered. Medication handout provided.  Provided patient with [facility] phone number. Patient knows to call the office with questions or concerns. [facility] will continue to follow.  Patient expressed understanding and was in agreement with this plan. She also understands that She can call clinic at any time with any questions, concerns, or complaints.   Medication Access Issues: No access issues identified during visit.  Thank you for allowing me to participate in the care of this patient.   Time Total: 45  Visit consisted of counseling and education on dealing with issues of symptom management in the setting of serious and potentially life-threatening illness.Greater than 50%  of this time was spent counseling and coordinating care related to the above assessment and plan.  Signed by: Darl Pikes, PharmD, BCPS, Salley Slaughter, CPP Hematology/Oncology Clinical Pharmacist Practitioner [facility] [phone]  11/03/2020 3:46 PM

## 2020-11-10 ENCOUNTER — Inpatient Hospital Stay (HOSPITAL_BASED_OUTPATIENT_CLINIC_OR_DEPARTMENT_OTHER): Payer: Medicare Other | Admitting: Oncology

## 2020-11-10 ENCOUNTER — Inpatient Hospital Stay: Payer: Medicare Other

## 2020-11-10 ENCOUNTER — Other Ambulatory Visit: Payer: Self-pay

## 2020-11-10 ENCOUNTER — Encounter: Payer: Self-pay | Admitting: Oncology

## 2020-11-10 VITALS — BP 140/65 | HR 91 | Temp 99.2°F | Resp 14 | Wt 134.5 lb

## 2020-11-10 DIAGNOSIS — E859 Amyloidosis, unspecified: Secondary | ICD-10-CM

## 2020-11-10 DIAGNOSIS — Z5111 Encounter for antineoplastic chemotherapy: Secondary | ICD-10-CM

## 2020-11-10 DIAGNOSIS — N1831 Chronic kidney disease, stage 3a: Secondary | ICD-10-CM

## 2020-11-10 DIAGNOSIS — R634 Abnormal weight loss: Secondary | ICD-10-CM

## 2020-11-10 DIAGNOSIS — E8581 Light chain (AL) amyloidosis: Secondary | ICD-10-CM

## 2020-11-10 DIAGNOSIS — D631 Anemia in chronic kidney disease: Secondary | ICD-10-CM

## 2020-11-10 DIAGNOSIS — R7989 Other specified abnormal findings of blood chemistry: Secondary | ICD-10-CM

## 2020-11-10 DIAGNOSIS — T451X5A Adverse effect of antineoplastic and immunosuppressive drugs, initial encounter: Secondary | ICD-10-CM

## 2020-11-10 DIAGNOSIS — R112 Nausea with vomiting, unspecified: Secondary | ICD-10-CM

## 2020-11-10 DIAGNOSIS — K219 Gastro-esophageal reflux disease without esophagitis: Secondary | ICD-10-CM | POA: Insufficient documentation

## 2020-11-10 LAB — COMPREHENSIVE METABOLIC PANEL
ALT: 34 U/L (ref 0–44)
AST: 46 U/L — ABNORMAL HIGH (ref 15–41)
Albumin: 2.1 g/dL — ABNORMAL LOW (ref 3.5–5.0)
Alkaline Phosphatase: 889 U/L — ABNORMAL HIGH (ref 38–126)
Anion gap: 7 (ref 5–15)
BUN: 14 mg/dL (ref 8–23)
CO2: 25 mmol/L (ref 22–32)
Calcium: 8.4 mg/dL — ABNORMAL LOW (ref 8.9–10.3)
Chloride: 101 mmol/L (ref 98–111)
Creatinine, Ser: 0.89 mg/dL (ref 0.44–1.00)
GFR, Estimated: >60 mL/min (ref >60)
Glucose, Bld: 275 mg/dL — ABNORMAL HIGH (ref 70–99)
Potassium: 3.4 mmol/L — ABNORMAL LOW (ref 3.5–5.1)
Sodium: 133 mmol/L — ABNORMAL LOW (ref 135–145)
Total Bilirubin: 3.6 mg/dL — ABNORMAL HIGH (ref 0.3–1.2)
Total Protein: 5.3 g/dL — ABNORMAL LOW (ref 6.5–8.1)

## 2020-11-10 LAB — CBC WITH DIFFERENTIAL/PLATELET
Abs Immature Granulocytes: 0.05 10*3/uL (ref 0.00–0.07)
Basophils Absolute: 0 10*3/uL (ref 0.0–0.1)
Basophils Relative: 0 %
Eosinophils Absolute: 0.1 10*3/uL (ref 0.0–0.5)
Eosinophils Relative: 2 %
HCT: 24.4 % — ABNORMAL LOW (ref 36.0–46.0)
Hemoglobin: 8.7 g/dL — ABNORMAL LOW (ref 12.0–15.0)
Immature Granulocytes: 1 %
Lymphocytes Relative: 20 %
Lymphs Abs: 1.3 10*3/uL (ref 0.7–4.0)
MCH: 26.7 pg (ref 26.0–34.0)
MCHC: 35.7 g/dL (ref 30.0–36.0)
MCV: 74.8 fL — ABNORMAL LOW (ref 80.0–100.0)
Monocytes Absolute: 0.3 10*3/uL (ref 0.1–1.0)
Monocytes Relative: 5 %
Neutro Abs: 4.8 10*3/uL (ref 1.7–7.7)
Neutrophils Relative %: 72 %
Platelets: 322 10*3/uL (ref 150–400)
RBC: 3.26 MIL/uL — ABNORMAL LOW (ref 3.87–5.11)
RDW: 24.5 % — ABNORMAL HIGH (ref 11.5–15.5)
WBC: 6.7 10*3/uL (ref 4.0–10.5)
nRBC: 0.5 % — ABNORMAL HIGH (ref 0.0–0.2)

## 2020-11-10 MED ORDER — ACETAMINOPHEN 325 MG PO TABS
325.0000 mg | ORAL_TABLET | Freq: Once | ORAL | Status: AC
  Administered 2020-11-10: 325 mg via ORAL
  Filled 2020-11-10: qty 1

## 2020-11-10 MED ORDER — DEXAMETHASONE 4 MG PO TABS
20.0000 mg | ORAL_TABLET | Freq: Once | ORAL | Status: AC
  Administered 2020-11-10: 20 mg via ORAL
  Filled 2020-11-10: qty 5

## 2020-11-10 MED ORDER — DARATUMUMAB-HYALURONIDASE-FIHJ 1800-30000 MG-UT/15ML ~~LOC~~ SOLN
1800.0000 mg | Freq: Once | SUBCUTANEOUS | Status: AC
  Administered 2020-11-10: 1800 mg via SUBCUTANEOUS
  Filled 2020-11-10: qty 15

## 2020-11-10 MED ORDER — DIPHENHYDRAMINE HCL 25 MG PO CAPS
50.0000 mg | ORAL_CAPSULE | Freq: Once | ORAL | Status: AC
  Administered 2020-11-10: 50 mg via ORAL
  Filled 2020-11-10: qty 2

## 2020-11-10 NOTE — Patient Instructions (Signed)
[facility]  Discharge Instructions: Thank you for choosing [facility] to provide your oncology and hematology care.  If you have a lab appointment with the Cancer Center, please go directly to the Cancer Center and check in at the registration area.  Wear comfortable clothing and clothing appropriate for easy access to any Portacath or PICC line.   We strive to give you quality time with your provider. You may need to reschedule your appointment if you arrive late (15 or more minutes).  Arriving late affects you and other patients whose appointments are after yours.  Also, if you miss three or more appointments without notifying the office, you may be dismissed from the clinic at the provider's discretion.      For prescription refill requests, have your pharmacy contact our office and allow 72 hours for refills to be completed.    Today you received the following chemotherapy and/or immunotherapy agents       To help prevent nausea and vomiting after your treatment, we encourage you to take your nausea medication as directed.  BELOW ARE SYMPTOMS THAT SHOULD BE REPORTED IMMEDIATELY: *FEVER GREATER THAN 100.4 F (38 C) OR HIGHER *CHILLS OR SWEATING *NAUSEA AND VOMITING THAT IS NOT CONTROLLED WITH YOUR NAUSEA MEDICATION *UNUSUAL SHORTNESS OF BREATH *UNUSUAL BRUISING OR BLEEDING *URINARY PROBLEMS (pain or burning when urinating, or frequent urination) *BOWEL PROBLEMS (unusual diarrhea, constipation, pain near the anus) TENDERNESS IN MOUTH AND THROAT WITH OR WITHOUT PRESENCE OF ULCERS (sore throat, sores in mouth, or a toothache) UNUSUAL RASH, SWELLING OR PAIN  UNUSUAL VAGINAL DISCHARGE OR ITCHING   Items with * indicate a potential emergency and should be followed up as soon as possible or go to the Emergency Department if any problems should occur.  Please show the CHEMOTHERAPY ALERT CARD or IMMUNOTHERAPY ALERT CARD at check-in to the Emergency  Department and triage nurse.  Should you have questions after your visit or need to cancel or reschedule your appointment, please contact [facility]  [phone] and follow the prompts.  Office hours are 8:00 a.m. to 4:30 p.m. Monday - Friday. Please note that voicemails left after 4:00 p.m. may not be returned until the following business day.  We are closed weekends and major holidays. You have access to a nurse at all times for urgent questions. Please call the main number to the clinic [phone] and follow the prompts.  For any non-urgent questions, you may also contact your provider using MyChart. We now offer e-Visits for anyone 18 and older to request care online for non-urgent symptoms. For details visit [email or web address].   Also download the MyChart app! Go to the app store, search "MyChart", open the app, select , and log in with your MyChart username and password.  Due to Covid, a mask is required upon entering the hospital/clinic. If you do not have a mask, one will be given to you upon arrival. For doctor visits, patients may have 1 support person aged 18 or older with them. For treatment visits, patients cannot have anyone with them due to current Covid guidelines and our immunocompromised population.  

## 2020-11-10 NOTE — Progress Notes (Signed)
Hematology/Oncology  Follow up note [facility] [phone] [phone]   Patient Care Team: Leonel Ramsay, MD as PCP - General (Infectious Diseases) End, [name], MD as PCP - Cardiology (Cardiology)  REFERRING PROVIDER: Leonel Ramsay, MD  CHIEF COMPLAINTS/REASON FOR VISIT:  Follow-up for amyloidosis  HISTORY OF PRESENTING ILLNESS:   Nancy Rodriguez is a  75 y.o.  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  Work up include protein electrophoresis showed M protein 0.7,   Reviewed her previous medical records via care everywhere.  She was seen by Hematology Oncology at [facility] on 02/14/2017.  07/25/2007  SPEP showed M protein of 0.35, IFE showed IgG lamda 09/22/2007  Bone survey negative.  02/15/14 IgG 930, SPEP M protien 0.22, free kappa light chain 3.59, lamda 2.92, ratio 1.23  Hypercalcemia, resolved after stopping HCTZ.  # Transaminitis 03/28/20 US abdomen showed left liver lobe Complex 1.7 cm cyst  # 04/07/20 MRI abdomen w/wo contrast showed 2 benign liver cysts, 2 nonspecific hypovascular irighr liver lesions, abnormal bone marrow signal of L1 vertebral body. Patient was  advised to proceed with bone marrow biopsy and she declined.  # referred her to established care with GI and was seen on 04/19/20,  # Liver biopsy showed amyloidosis. [name] MS/MS showed AL type # 05/20/20 recommend bone marrow biopsy which is scheduled on 05/24/2020.  Patient changed her mind and ask a biopsy to be canceled.  My team and I had multiple phone discussion with patient's daughter Nancy Rodriguez and later with the patient's son Nancy Rodriguez.  Patient agreed with bone marrow biopsy and a biopsy was scheduled on 06/09/2020.  05/20/20 NT proBNP  was normal,  TSH slightly increased, normal  T4 Normal factor X Normal coags Troponin 19. Refer to cardiology for evaluation. May need cardiac MRI  # 06/01/2020 2D echo result was reviewed and discussed with patient.  Patient has normal LVEF 60-65%. Mild asymmetric left ventricular hypertrophy.  Left ventricular diastolic parameters are indeterminate. average left ventricular global longitudinal strain is -16.8 %.  # 06/09/2020, bone marrow biopsy showed monoclonal plasmacytosis, 8%, amyloid deposit present.  Absent iron stores. 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 t (14;20) #06/17/2020, further discussed about diagnosis and treatment plan.  Patient declined bone marrow transplant evaluation.  Decision was made to proceed with Dara-CyborD chemotherapy treatments. #May 2022, second opinion at [place] with Dr. Tracey Harries.who agrees with current treatment plan.  He recommends to lower dexamethasone to 20 mg and decrease premed Tylenol to 325 mg  09/01/2020 cardiac MRI was performed at [facility].  Left ventricle is normal in size.  Mild to moderate basal septal hypertrophy.  Hyper dynamic LV function.  LVEF 71%.  Right ventricle is normal in size and wall thickness.  Systolic function normal.  Mild bi atrial enlargement, no significant aortic valve stenosis or regurgitation.  Trivial mitral regurgitation, mild tricuspid regurgitation and a trivial pulmonic regurgitation.  No evidence of MI, scarring or infiltration.  #History of major depression listed in outside problem list.  previous diagnosis and treatment details are not available to me in EMR  I have referred her to psychiatrist  #  vaginal bleeding which stopped- she was seen by GYN and recommended for biopsy.   # She has poor IV access  and was not able to receive IV Emend and IV Aloxi. She does not want to have med port.    # 10/08/2020, cervical spine showed cervical spondylosis.  Spinal canal diagnosis and a posterior disc osteophyte complex and  superimposed small central disc protrusion contact the ventral spinal cord at C4-C5.  Multilevel neuroforaminal narrowing. bilateral atlantooccipital joint effusions, multilevel grade 1 spondylolisthesis. MRI lumbar spine without contrast showed acute or subacute fracture of L4.   Moderate spinal stenosis at L3-4 has progressed. Shallow left foraminal disc protrusion has progressed. Moderate subarticular stenosis on the left also with progression  Moderate spinal stenosis L4-5 with progression. Moderate subarticular stenosis on the left with progression  INTERVAL HISTORY Nancy Rodriguez is a 75 y.o. female who has above history reviewed by me today presents for follow up visit for management of AL amyloidosis Problems and complaints are listed below: During the interval patient was seen by [facility] oncology Dr.Kang who recommend patient to switch Dara CyBorD to [address] Patient has started Revlimid 10 mg.  For this cycle, decision was made to do Revlimid 10 mg 2 weeks on and 1 week off in order to synchronize with her Daratumumab treatments of next cycle.  Patient reports tolerating well. She was accompanied by her daughter today.  Her son Nancy Rodriguez was called during the encounter and was able to hear the entire conversation and participate in discussion.  Patient reports acid reflux.  Dr. Ola Spurr has increased her omeprazole to 40 mg daily. Reports that she is taking her antiemetics scheduled in order to treat acid reflux symptoms.      Review of Systems  Constitutional:  Positive for fatigue. Negative for appetite change, chills, fever and unexpected weight change.  HENT:   Negative for hearing loss, nosebleeds and voice change.   Eyes:  Positive for icterus. Negative for eye problems.  Respiratory:  Negative for chest tightness and cough.   Cardiovascular:  Positive for leg swelling. Negative for chest pain.  Gastrointestinal:  Negative for abdominal distention, abdominal pain, blood in stool and  nausea.       Acid reflux  Endocrine: Negative for hot flashes.  Genitourinary:  Negative for difficulty urinating and frequency.   Musculoskeletal:  Positive for back pain and neck pain. Negative for arthralgias.  Skin:  Negative for rash.       jaundice  Neurological:  Positive for numbness. Negative for extremity weakness.  Hematological:  Negative for adenopathy.  Psychiatric/Behavioral:  Negative for confusion.    MEDICAL HISTORY:  Past Medical History:  Diagnosis Date   Anemia    Anemia in chronic kidney disease   Chronic kidney disease    Stage 3b chronic kidney disease   Diabetes mellitus without complication (HCC)    Hypercholesterolemia    Hypertension    MGUS (monoclonal gammopathy of unknown significance)    Osteoarthritis    Rheumatoid arthritis ([place])     SURGICAL HISTORY: Past Surgical History:  Procedure Laterality Date   HERNIA REPAIR     PARATHYROIDECTOMY      SOCIAL HISTORY: Social History   Socioeconomic History   Marital status: Widowed    Spouse name: Not on file   Number of children: 6   Years of education: Not on file   Highest education level: Not on file  Occupational History   Not on file  Tobacco Use   Smoking status: Never   Smokeless tobacco: Never  Vaping Use   Vaping Use: Never used  Substance and  Sexual Activity   Alcohol use: No   Drug use: Never   Sexual activity: Not on file  Other Topics Concern   Not on file  Social History Narrative   Not on file   Social Determinants of Health   Financial Resource Strain: Not on file  Food Insecurity: Not on file  Transportation Needs: Not on file  Physical Activity: Not on file  Stress: Not on file  Social Connections: Not on file  Intimate Partner Violence: Not on file    FAMILY HISTORY: Family History  Problem Relation Age of Onset   Diabetes Daughter    Diabetes Son    Dementia Mother    Cancer Father    Esophageal cancer Sister    Brain cancer Brother      ALLERGIES:  is allergic to benazepril, lisinopril, tolmetin, and nsaids.  MEDICATIONS:  Current Outpatient Medications  Medication Sig Dispense Refill   acyclovir (ZOVIRAX) 400 MG tablet TAKE 1 TABLET BY MOUTH TWICE A DAY 60 tablet 5   albuterol (VENTOLIN HFA) 108 (90 Base) MCG/ACT inhaler Inhale 2 puffs into the lungs every 4 (four) hours as needed.     amLODipine (NORVASC) 5 MG tablet Take 5 mg by mouth daily.     aspirin EC 81 MG tablet Take 1 tablet (81 mg total) by mouth daily. Swallow whole. 30 tablet 0   cephALEXin (KEFLEX) 500 MG capsule Take 500 mg by mouth 3 (three) times daily.     Continuous Blood Gluc Sensor (FREESTYLE LIBRE 14 DAY SENSOR) MISC SMARTSIG:1 Kit(s) Topical Every 2 Weeks     dexamethasone (DECADRON) 4 MG tablet Take 5 tablets (20 mg total) by mouth once a week. 60 tablet 0   docusate sodium (COLACE) 100 MG capsule Take 1 capsule by mouth daily.     EPINEPHrine 0.3 mg/0.3 mL IJ SOAJ injection Inject 0.3 mg into the muscle as needed.     ferrous sulfate 325 (65 FE) MG tablet Take 1 tablet (325 mg total) by mouth 2 (two) times daily with a meal. 60 tablet 2   furosemide (LASIX) 20 MG tablet TAKE 2 TABLETS BY MOUTH DAILY. TAKE 1 TABLET DAILY FOR 3 DAYS OR UNTIL SWELLING IMPROVES (Patient taking differently: One tablet daily) 60 tablet 3   gabapentin (NEURONTIN) 100 MG capsule 1 po qHS     gabapentin (NEURONTIN) 300 MG capsule Take 300 mg by mouth at bedtime.     hydrOXYzine (ATARAX/VISTARIL) 10 MG tablet Take 10 mg by mouth 2 (two) times daily as needed.     insulin glargine (LANTUS SOLOSTAR) 100 UNIT/ML Solostar Pen Inject 17 Units into the skin 2 (two) times daily. Pt takes in morning and bedtime.     KLOR-CON M20 20 MEQ tablet TAKE 2 TABLETS BY MOUTH 2 TIMES DAILY. 120 tablet 0   lenalidomide (REVLIMID) 10 MG capsule Take 1 capsule (10 mg total) by mouth daily. Take for 21 days, then hold for 7 days. Repeat every 28 days. 21 capsule 0   LORazepam (ATIVAN) 0.5 MG  tablet Take 1 tablet (0.5 mg total) by mouth every 6 (six) hours as needed (nausea vomiting). 30 tablet 0   megestrol (MEGACE) 40 MG/ML suspension TAKE 10 MLS (400 MG TOTAL) BY MOUTH DAILY. 240 mL 0   metoprolol succinate (TOPROL-XL) 50 MG 24 hr tablet TAKE 1 TABLET (50 MG TOTAL) BY MOUTH DAILY IN THE MORNING     mirtazapine (REMERON) 7.5 MG tablet Take 1 tablet (7.5 mg total) by mouth  at bedtime. 30 tablet 2   omeprazole (PRILOSEC) 20 MG capsule Take 1 capsule (20 mg total) by mouth daily. 30 capsule 1   omeprazole (PRILOSEC) 40 MG capsule Take by mouth.     ondansetron (ZOFRAN) 8 MG tablet TAKE 8 MG BY MOUTH 30 TO 60 MIN PRIOR TO CYTOXAN ADMINISTRATION THEN TAKE 8 MG TWICE DAILY AS NEEDED FOR NAUSEA AND VOMITING. 30 tablet 1   ondansetron (ZOFRAN) 8 MG tablet Take by mouth.     promethazine (PHENERGAN) 25 MG tablet TAKE 1 TABLET BY MOUTH EVERY 8 HOURS AS NEEDED FOR NAUSEA AND VOMITING 90 tablet 1   traMADol (ULTRAM) 50 MG tablet Take 1 tablet (50 mg total) by mouth every 6 (six) hours as needed for severe pain. 8 tablet 0   No current facility-administered medications for this visit.   Facility-Administered Medications Ordered in Other Visits  Medication Dose Route Frequency Provider Last Rate Last Admin   daratumumab-hyaluronidase-fihj (DARZALEX FASPRO) 1800-30000 MG-UT/15ML chemo SQ injection 1,800 mg  1,800 mg Subcutaneous Once [name], MD         PHYSICAL EXAMINATION: ECOG PERFORMANCE STATUS: 1 - Symptomatic but completely ambulatory Vitals:   11/10/20 0927  BP: 140/65  Pulse: 91  Resp: 14  Temp: 99.2 F (37.3 C)  SpO2: 99%   Filed Weights   11/10/20 0927  Weight: 134 lb 7.7 oz (61 kg)    Physical Exam Constitutional:      General: She is not in acute distress. HENT:     Head: Normocephalic and atraumatic.  Eyes:     General: No scleral icterus. Cardiovascular:     Rate and Rhythm: Normal rate and regular rhythm.     Heart sounds: Murmur heard.  Pulmonary:      Effort: Pulmonary effort is normal. No respiratory distress.     Breath sounds: No wheezing.  Abdominal:     General: Bowel sounds are normal. There is no distension.     Palpations: Abdomen is soft.  Musculoskeletal:        General: Swelling present. No deformity. Normal range of motion.     Cervical back: Normal range of motion and neck supple.     Comments: Bilateral lower extremity 1+ edema  Skin:    General: Skin is warm and dry.     Findings: No erythema or rash.  Neurological:     Mental Status: She is alert and oriented to person, place, and time. Mental status is at baseline.     Cranial Nerves: No cranial nerve deficit.     Coordination: Coordination normal.  Psychiatric:        Mood and Affect: Mood normal.    LABORATORY DATA:  I have reviewed the data as listed Lab Results  Component Value Date   WBC 6.7 11/10/2020   HGB 8.7 (L) 11/10/2020   HCT 24.4 (L) 11/10/2020   MCV 74.8 (L) 11/10/2020   PLT 322 11/10/2020   Recent Labs    10/20/20 0831 10/27/20 0807 11/10/20 0915  NA 134* 134* 133*  K 3.5 3.0* 3.4*  CL 103 102 101  CO2 $Re'24 25 25  'CRc$ GLUCOSE 141* 232* 275*  BUN $Re'16 12 14  'HFz$ CREATININE 1.02* 0.80 0.89  CALCIUM 9.0 8.9 8.4*  GFRNONAA 57* >60 >60  PROT 5.6* 6.2* 5.3*  ALBUMIN 2.1* 2.4* 2.1*  AST 70* 58* 46*  ALT 46* 45* 34  ALKPHOS 1,159* 1,117* 889*  BILITOT 4.0* 4.8* 3.6*    Iron/TIBC/Ferritin/ %Sat  Component Value Date/Time   IRON 35 08/31/2020 1037   TIBC 290 08/31/2020 1037   FERRITIN 82 08/31/2020 1037   IRONPCTSAT 12 08/31/2020 1037     08/25/2019, platelet count 491, WBC 7.5, hemoglobin 12 Creatinine 1.58, EGFR 37, calcium 10.8, albumin 4.2 Negative hepatitis B surface antigen, hepatitis B core antibody, Negative hepatitis C 08/05/2019, A1c 11.2   RADIOGRAPHIC STUDIES: I have personally reviewed the radiological images as listed and agreed with the findings in the report. CT ABDOMEN PELVIS WO CONTRAST  Result Date:  09/02/2020 CLINICAL DATA:  Light chain amyloidosis. Weight loss. Abdominal distension. EXAM: CT ABDOMEN AND PELVIS WITHOUT CONTRAST TECHNIQUE: Multidetector CT imaging of the abdomen and pelvis was performed following the standard protocol without IV contrast. COMPARISON:  MRI abdomen 04/07/2020 FINDINGS: Lower chest: Lung bases are clear. Hepatobiliary: No focal hepatic lesion on noncontrast exam. Gallstones present in a collapsed gallbladder. Caudate lobe is prominent. Fluid along the RIGHT hepatic margin. Benign cyst in LEFT hepatic lobe. Pancreas: Pancreas is normal. No ductal dilatation. No pancreatic inflammation. Spleen: Normal spleen Adrenals/urinary tract: Adrenal glands normal. No nephrolithiasis ureterolithiasis. No obstructive uropathy. The density of the urine in the bladder is greater than typical (HU equal 27). No IV contrast administered. Stomach/Bowel: Stomach, small bowel, appendix, and cecum are normal. The colon and rectosigmoid colon are normal. Vascular/Lymphatic: Abdominal aorta is normal caliber with atherosclerotic calcification. There is no retroperitoneal or periportal lymphadenopathy. No pelvic lymphadenopathy. Reproductive: Uterus and adnexa unremarkable. Other: Moderate volume of free fluid along the margin liver and collecting in the pelvis. Focal of fluid simple fluid attenuation without organization. Musculoskeletal: No aggressive osseous lesion. IMPRESSION: 1. High-density urine within the bladder. Recommend urinalysis and correlation for cystitis or hematuria. 2. Moderate volume of free fluid in the RIGHT abdomen and pelvis is favored ascites. Recommend correlation with liver function. 3. Cholelithiasis without evidence cholecystitis. These results will be called to the ordering clinician or representative by the Radiologist Assistant, and communication documented in the PACS or [employer]. Electronically Signed   By: Suzy Bouchard M.D.   On: 09/02/2020 09:25   DG Lumbar  Spine Complete  Result Date: 09/23/2020 CLINICAL DATA:  Acute low back pain. EXAM: LUMBAR SPINE - COMPLETE 4+ VIEW COMPARISON:  None. FINDINGS: Mildly displaced fracture is seen involving the L4 vertebral body. Mild grade 1 anterolisthesis of L4-5 is noted secondary to posterior facet joint hypertrophy. Mild degenerative disc disease is noted at L1-2, L2-3 and L5-S1 with anterior osteophyte formation. IMPRESSION: Mildly displaced acute fracture is seen involving the L4 vertebral body. These results will be called to the ordering clinician or representative by the Radiologist Assistant, and communication documented in the PACS or [employer]. Aortic Atherosclerosis (ICD10-I70.0). Electronically Signed   By: Marijo Conception M.D.   On: 09/23/2020 14:44   MR CERVICAL SPINE WO CONTRAST  Result Date: 10/10/2020 CLINICAL DATA:  Cervical myelopathy. Additional history provided by scanning technologist: Patient reports fall June 3rd resulting in shoulder dislocation, numbness of left 4/5 fingers, difficulty with movement. EXAM: MRI CERVICAL SPINE WITHOUT CONTRAST TECHNIQUE: Multiplanar, multisequence MR imaging of the cervical spine was performed. No intravenous contrast was administered. COMPARISON:  No pertinent prior exams available for comparison. FINDINGS: Alignment: Reversal of the expected cervical lordosis. Trace C2-C3 grade 1 anterolisthesis. 2 mm C3-C4 grade 1 anterolisthesis. Trace C5-C6 grade 1 retrolisthesis. Trace C7-T1 grade 1 anterolisthesis. Vertebrae: Vertebral body height is maintained. No significant marrow edema or focal suspicious osseous lesion. Multilevel ventral osteophytes. Cord: No  spinal cord signal abnormality is identified. Posterior Fossa, vertebral arteries, paraspinal tissues: Incidentally noted partially empty sella turcica. No abnormality identified within included portions of the posterior fossa. Flow voids preserved within the imaged cervical vertebral arteries. Paraspinal  soft tissues within normal limits. Small bilateral atlantooccipital joint effusions. Disc levels: Moderate disc degeneration at C4-C5, C5-C6 and C6-C7. Mild disc degeneration at the remaining levels. C2-C3: Trace grade 1 anterolisthesis. Disc uncovering with tiny right center disc protrusion. Minimal facet arthrosis. No significant spinal canal or foraminal stenosis. C3-C4: Grade 1 anterolisthesis. Disc uncovering with small central disc protrusion. Uncovertebral hypertrophy. The disc protrusion mildly effaces the ventral thecal sac without significant spinal cord mass effect. Bilateral neural foraminal narrowing (mild right, moderate left). C4-C5: Posterior disc osteophyte complex with bilateral disc osteophyte ridge/uncinate hypertrophy. Superimposed small central disc protrusion. Mild spinal canal narrowing with contact upon the ventral spinal cord. Moderate/severe bilateral neural foraminal narrowing (greater on the right). C5-C6: Trace grade 1 retrolisthesis. Disc bulge. Superimposed small central disc extrusion with cranial migration to the mid L5 vertebral body level (series 9, image 8). Right-sided disc osteophyte ridge/uncinate hypertrophy. Mild relative spinal canal narrowing. Severe right neural foraminal narrowing. C6-C7: Disc bulge with bilateral disc osteophyte ridge/uncinate hypertrophy. Mild spinal canal narrowing. Bilateral neural foraminal narrowing (moderate/severe right, moderate left). C7-T1: Trace grade 1 anterolisthesis. Slight disc uncovering. Superimposed small central disc protrusion. Minimal facet arthrosis minimal partial effacement of the ventral thecal sac without spinal cord mass effect. No significant foraminal narrowing. T1-T2: Imaged sagittally. Small central disc protrusion. Mild effacement of the ventral thecal sac without appreciable spinal cord mass effect. No significant foraminal stenosis. T2-T3: Trace grade 1 anterolisthesis. No appreciable significant disc herniation or  spinal canal stenosis. Facet arthrosis/ligamentum flavum hypertrophy. Mild bilateral neural foraminal narrowing (greater on the right). IMPRESSION: Cervical spondylosis, as outlined. No more than mild spinal canal stenosis. Most notably, a posterior disc osteophyte complex and superimposed small central disc protrusion contact the ventral spinal cord at C4-C5. Multilevel neural foraminal narrowing, as detailed and greatest on the left at C3-C4 (moderate), bilaterally at C4-C5 (moderate/severe) and bilaterally at C6-C7 (moderate/severe right, moderate left). Reversal of the expected cervical lordosis. Mild multilevel grade 1 spondylolisthesis, as above. Small bilateral atlantooccipital joint effusions. Electronically Signed   By: Kellie Simmering DO   On: 10/10/2020 10:42   MR LUMBAR SPINE WO CONTRAST  Result Date: 10/10/2020 CLINICAL DATA:  Closed compression fracture L4 vertebral body. EXAM: MRI LUMBAR SPINE WITHOUT CONTRAST TECHNIQUE: Multiplanar, multisequence MR imaging of the lumbar spine was performed. No intravenous contrast was administered. COMPARISON:  Lumbar radiographs 09/21/2020.  Lumbar MRI 05/11/2020 FINDINGS: Segmentation:  Normal Alignment: Mild retrolisthesis L1-2, L2-3, L3-4. Mild anterolisthesis L4-5 Vertebrae: Moderate compression fracture of L4. No underlying lesion is suspected. There is mild diffuse bone marrow edema. No significant retropulsion of bone into the canal. Fracture appears to involve the superior and inferior endplates. 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 No additional fracture Conus medullaris and cauda equina: Conus extends to the L2 level. Conus and cauda equina appear normal. Paraspinal and other soft tissues: Negative for paraspinous mass or adenopathy or fluid collection. Mild paraspinous edema around the L4 fracture. Disc levels: T12-L1: Small central disc protrusion unchanged from the prior MRI. No significant stenosis L1-2: Mild retrolisthesis.  Mild disc  degeneration without stenosis L2-3: Mild retrolisthesis. Disc bulging and mild facet degeneration. Negative for stenosis L3-4: Disc bulging and diffuse endplate spurring. Shallow left foraminal disc protrusion has progressed. Bilateral facet hypertrophy. Progression  of moderate spinal stenosis and moderate subarticular stenosis on the left. L4-5: Mild anterolisthesis. Central disc protrusion unchanged. Mild left foraminal disc protrusion unchanged. Advanced facet degeneration. Moderate spinal stenosis with progression. Moderate subarticular stenosis on the left with progression. L5-S1: Disc degeneration with diffuse endplate spurring. Mild foraminal stenosis bilaterally unchanged. IMPRESSION: 1. Moderately severe acute or subacute fracture of L4 without significant retropulsion of bone into the canal. Large hemangioma L1. No other fracture 2. Moderate spinal stenosis at L3-4 has progressed. Shallow left foraminal disc protrusion has progressed. Moderate subarticular stenosis on the left also with progression 3. Moderate spinal stenosis L4-5 with progression. Moderate subarticular stenosis on the left with progression. Electronically Signed   By: Franchot Gallo M.D.   On: 10/10/2020 10:41   US Abdomen Limited  Result Date: 08/25/2020 CLINICAL DATA:  Ascites. EXAM: LIMITED ABDOMEN ULTRASOUND FOR ASCITES TECHNIQUE: Limited ultrasound survey for ascites was performed in all four abdominal quadrants. COMPARISON:  None. FINDINGS: 4 quadrant assessment of the abdomen/pelvis demonstrates no intraperitoneal free fluid. Small bilateral pleural effusions noted incidentally. IMPRESSION: No sonographic evidence of ascites. Electronically Signed   By: Misty Stanley M.D.   On: 08/25/2020 09:32   DG Shoulder Left  Result Date: 09/15/2020 CLINICAL DATA:  75 year old female with anterior left shoulder dislocation after fall. Post reduction. EXAM: LEFT SHOULDER - 2+ VIEW COMPARISON:  0907 hours today. FINDINGS: Normal  glenohumeral joint alignment now. Proximal left humerus and scapula appear intact. Mild degenerative changes at the left [facility] joint. Left clavicle and visible left ribs appear intact. IMPRESSION: Reduction of the left shoulder dislocation with no fracture identified. Electronically Signed   By: Genevie Ann M.D.   On: 09/15/2020 11:11   DG Shoulder Left  Result Date: 09/15/2020 CLINICAL DATA:  75 year old female status post fall and pain. EXAM: LEFT SHOULDER - 2+ VIEW COMPARISON:  Left shoulder series 11/30/2014. FINDINGS: Anterior glenohumeral dislocation. No acute fracture identified. Left clavicle and scapula appear to remain normally aligned. Visible left ribs appear intact. IMPRESSION: Anterior left glenohumeral dislocation.  No fracture identified. Electronically Signed   By: Genevie Ann M.D.   On: 09/15/2020 09:25   US Abdomen Limited RUQ (LIVER/GB)  Result Date: 10/31/2020 CLINICAL DATA:  Hyperbilirubinemia. EXAM: ULTRASOUND ABDOMEN LIMITED RIGHT UPPER QUADRANT COMPARISON:  CT of the abdomen and pelvis without contrast 09/02/2020. Right upper quadrant ultrasound 08/25/2020. FINDINGS: Gallbladder: Large gallstones scratched at large shadowing gallstones noted. These measure up to 9 mm. Gallbladder is contracted. Wall thickness measures 5 mm, likely due to contraction. No sonographic Percell Miller sign is present. Common bile duct: Diameter: 4 mm Liver: Simple cyst the left lobe measures up to 1.4 cm. Hepatic parenchyma within normal limits. Portal vein is patent on color Doppler imaging with normal direction of blood flow towards the liver. Other: Small amount of ascites is noted. IMPRESSION: 1. Cholelithiasis without evidence for acute cholecystitis. 2. Small amount of ascites. 3. Normal sonographic appearance of the liver.  Stable cyst. Electronically Signed   By: San Morelle M.D.   On: 10/31/2020 09:46       ASSESSMENT & PLAN:  1. Light chain (AL) amyloidosis (HCC)   2. Encounter for antineoplastic  chemotherapy   3. 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 Hyperbilirubinemia   5. Elevated LFTs   6. Loss of weight   7. Chemotherapy induced nausea and vomiting   8. 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  proceed with daratumumab today. Continue Revlimid 10 mg for 1 more week and then 1 week off. Continue dexamethasone 20 mg weekly.  Continue aspirin 81 mg daily.  She may use over-the-counter supply.  #Elevated LFT, due to amyloidosis liver involvement.  Ultrasound showed small amount of ascites.  Patient has appointment with [facility].  #GERD, continue omeprazole 40 mg daily. 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   #Chemotherapy-induced nausea, previously due to the oral cyclophosphamide.  Cyclophosphamide discontinued.  We discussed about antiemetics indication and instructions today again.   #Lack of IV access, discussed about option of Mediport placement.  She declined.  #Hypokalemia due to lasix, potassium level 3.4.  Continue potassium to 34meq twice daily potassium 3.4 today. Marland Kitchen  #Chronic kidney disease, probably AL involvement follow-up with nephrology.  Stable creatinine. Repeat 24-hour urine protein, ordered and she did not do,  Follow up with nephrology  #Anemia is multifactorial, from CKD, marrow suppression and iron deficiency. Microcytic anemia likely due to iron deficiency, continue.  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  Marland Kitchen  #Anxiety, recommend patient to take Remeron-unclear if patient is following the recommendation.  #Unintentional weight loss, symptoms improved after started on Megace.  Continue Megace as needed for appetite  stimulant.  #Bilateral lower extremity swelling, neg DVT.  On Lasix.  Improved.recommend compression stocking   # right great toe dragging and numbness. 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 Continue  gabapentin.   We spent sufficient time to discuss many aspect of care, questions were answered to patient's satisfaction.  Lab MD 2 weeks, Daratuma Mab cc Leonel Ramsay, MD   [name], MD, PhD Hematology Oncology [facility] Pager- [phone] 11/10/2020

## 2020-11-10 NOTE — Progress Notes (Signed)
Pt dislocated her shoulder June 2nd. Nausea, acid reflux going on two weeks.

## 2020-11-16 ENCOUNTER — Other Ambulatory Visit: Payer: Self-pay | Admitting: Oncology

## 2020-11-21 ENCOUNTER — Other Ambulatory Visit: Payer: Medicare Other

## 2020-11-24 ENCOUNTER — Inpatient Hospital Stay (HOSPITAL_BASED_OUTPATIENT_CLINIC_OR_DEPARTMENT_OTHER): Payer: Medicare Other | Admitting: Oncology

## 2020-11-24 ENCOUNTER — Other Ambulatory Visit: Payer: Self-pay

## 2020-11-24 ENCOUNTER — Inpatient Hospital Stay: Payer: Medicare Other | Attending: Oncology

## 2020-11-24 ENCOUNTER — Inpatient Hospital Stay: Payer: Medicare Other

## 2020-11-24 ENCOUNTER — Ambulatory Visit: Payer: Medicare Other

## 2020-11-24 ENCOUNTER — Encounter: Payer: Self-pay | Admitting: Oncology

## 2020-11-24 VITALS — BP 145/69 | HR 88 | Temp 97.9°F | Resp 14 | Wt 129.0 lb

## 2020-11-24 DIAGNOSIS — N1831 Chronic kidney disease, stage 3a: Secondary | ICD-10-CM

## 2020-11-24 DIAGNOSIS — E8581 Light chain (AL) amyloidosis: Secondary | ICD-10-CM | POA: Diagnosis not present

## 2020-11-24 DIAGNOSIS — Z79899 Other long term (current) drug therapy: Secondary | ICD-10-CM | POA: Insufficient documentation

## 2020-11-24 DIAGNOSIS — R634 Abnormal weight loss: Secondary | ICD-10-CM

## 2020-11-24 DIAGNOSIS — R7989 Other specified abnormal findings of blood chemistry: Secondary | ICD-10-CM

## 2020-11-24 DIAGNOSIS — K219 Gastro-esophageal reflux disease without esophagitis: Secondary | ICD-10-CM | POA: Diagnosis not present

## 2020-11-24 DIAGNOSIS — Z5111 Encounter for antineoplastic chemotherapy: Secondary | ICD-10-CM | POA: Diagnosis not present

## 2020-11-24 DIAGNOSIS — Z7982 Long term (current) use of aspirin: Secondary | ICD-10-CM | POA: Diagnosis not present

## 2020-11-24 DIAGNOSIS — E859 Amyloidosis, unspecified: Secondary | ICD-10-CM

## 2020-11-24 DIAGNOSIS — D631 Anemia in chronic kidney disease: Secondary | ICD-10-CM

## 2020-11-24 DIAGNOSIS — I1310 Hypertensive heart and chronic kidney disease without heart failure, with stage 1 through stage 4 chronic kidney disease, or unspecified chronic kidney disease: Secondary | ICD-10-CM | POA: Diagnosis not present

## 2020-11-24 LAB — CBC WITH DIFFERENTIAL/PLATELET
Abs Immature Granulocytes: 0.02 10*3/uL (ref 0.00–0.07)
Basophils Absolute: 0.1 10*3/uL (ref 0.0–0.1)
Basophils Relative: 1 %
Eosinophils Absolute: 0.2 10*3/uL (ref 0.0–0.5)
Eosinophils Relative: 4 %
HCT: 27.1 % — ABNORMAL LOW (ref 36.0–46.0)
Hemoglobin: 9.5 g/dL — ABNORMAL LOW (ref 12.0–15.0)
Immature Granulocytes: 1 %
Lymphocytes Relative: 35 %
Lymphs Abs: 1.5 10*3/uL (ref 0.7–4.0)
MCH: 26.5 pg (ref 26.0–34.0)
MCHC: 35.1 g/dL (ref 30.0–36.0)
MCV: 75.5 fL — ABNORMAL LOW (ref 80.0–100.0)
Monocytes Absolute: 0.8 10*3/uL (ref 0.1–1.0)
Monocytes Relative: 19 %
Neutro Abs: 1.7 10*3/uL (ref 1.7–7.7)
Neutrophils Relative %: 40 %
Platelets: 470 10*3/uL — ABNORMAL HIGH (ref 150–400)
RBC: 3.59 MIL/uL — ABNORMAL LOW (ref 3.87–5.11)
RDW: 24.8 % — ABNORMAL HIGH (ref 11.5–15.5)
WBC: 4.2 10*3/uL (ref 4.0–10.5)
nRBC: 0 % (ref 0.0–0.2)

## 2020-11-24 LAB — COMPREHENSIVE METABOLIC PANEL
ALT: 42 U/L (ref 0–44)
AST: 41 U/L (ref 15–41)
Albumin: 2.3 g/dL — ABNORMAL LOW (ref 3.5–5.0)
Alkaline Phosphatase: 985 U/L — ABNORMAL HIGH (ref 38–126)
Anion gap: 7 (ref 5–15)
BUN: 16 mg/dL (ref 8–23)
CO2: 22 mmol/L (ref 22–32)
Calcium: 8.9 mg/dL (ref 8.9–10.3)
Chloride: 104 mmol/L (ref 98–111)
Creatinine, Ser: 0.89 mg/dL (ref 0.44–1.00)
GFR, Estimated: >60 mL/min (ref >60)
Glucose, Bld: 247 mg/dL — ABNORMAL HIGH (ref 70–99)
Potassium: 3.4 mmol/L — ABNORMAL LOW (ref 3.5–5.1)
Sodium: 133 mmol/L — ABNORMAL LOW (ref 135–145)
Total Bilirubin: 3.6 mg/dL — ABNORMAL HIGH (ref 0.3–1.2)
Total Protein: 6.1 g/dL — ABNORMAL LOW (ref 6.5–8.1)

## 2020-11-24 MED ORDER — ACETAMINOPHEN 325 MG PO TABS
325.0000 mg | ORAL_TABLET | Freq: Once | ORAL | Status: AC
  Administered 2020-11-24: 325 mg via ORAL
  Filled 2020-11-24: qty 1

## 2020-11-24 MED ORDER — DIPHENHYDRAMINE HCL 25 MG PO CAPS
50.0000 mg | ORAL_CAPSULE | Freq: Once | ORAL | Status: AC
  Administered 2020-11-24: 50 mg via ORAL
  Filled 2020-11-24: qty 2

## 2020-11-24 MED ORDER — DEXAMETHASONE 4 MG PO TABS
20.0000 mg | ORAL_TABLET | Freq: Once | ORAL | Status: AC
  Administered 2020-11-24: 20 mg via ORAL

## 2020-11-24 MED ORDER — DARATUMUMAB-HYALURONIDASE-FIHJ 1800-30000 MG-UT/15ML ~~LOC~~ SOLN
1800.0000 mg | Freq: Once | SUBCUTANEOUS | Status: AC
  Administered 2020-11-24: 1800 mg via SUBCUTANEOUS
  Filled 2020-11-24: qty 15

## 2020-11-24 NOTE — Patient Instructions (Signed)
[facility]  Discharge Instructions: Thank you for choosing [facility] to provide your oncology and hematology care.  If you have a lab appointment with the Cancer Center, please go directly to the Cancer Center and check in at the registration area.  Wear comfortable clothing and clothing appropriate for easy access to any Portacath or PICC line.   We strive to give you quality time with your provider. You may need to reschedule your appointment if you arrive late (15 or more minutes).  Arriving late affects you and other patients whose appointments are after yours.  Also, if you miss three or more appointments without notifying the office, you may be dismissed from the clinic at the provider's discretion.      For prescription refill requests, have your pharmacy contact our office and allow 72 hours for refills to be completed.    Today you received the following chemotherapy and/or immunotherapy agents       To help prevent nausea and vomiting after your treatment, we encourage you to take your nausea medication as directed.  BELOW ARE SYMPTOMS THAT SHOULD BE REPORTED IMMEDIATELY: *FEVER GREATER THAN 100.4 F (38 C) OR HIGHER *CHILLS OR SWEATING *NAUSEA AND VOMITING THAT IS NOT CONTROLLED WITH YOUR NAUSEA MEDICATION *UNUSUAL SHORTNESS OF BREATH *UNUSUAL BRUISING OR BLEEDING *URINARY PROBLEMS (pain or burning when urinating, or frequent urination) *BOWEL PROBLEMS (unusual diarrhea, constipation, pain near the anus) TENDERNESS IN MOUTH AND THROAT WITH OR WITHOUT PRESENCE OF ULCERS (sore throat, sores in mouth, or a toothache) UNUSUAL RASH, SWELLING OR PAIN  UNUSUAL VAGINAL DISCHARGE OR ITCHING   Items with * indicate a potential emergency and should be followed up as soon as possible or go to the Emergency Department if any problems should occur.  Please show the CHEMOTHERAPY ALERT CARD or IMMUNOTHERAPY ALERT CARD at check-in to the Emergency  Department and triage nurse.  Should you have questions after your visit or need to cancel or reschedule your appointment, please contact [facility]  [phone] and follow the prompts.  Office hours are 8:00 a.m. to 4:30 p.m. Monday - Friday. Please note that voicemails left after 4:00 p.m. may not be returned until the following business day.  We are closed weekends and major holidays. You have access to a nurse at all times for urgent questions. Please call the main number to the clinic [phone] and follow the prompts.  For any non-urgent questions, you may also contact your provider using MyChart. We now offer e-Visits for anyone 18 and older to request care online for non-urgent symptoms. For details visit [email or web address].   Also download the MyChart app! Go to the app store, search "MyChart", open the app, select [facility], and log in with your MyChart username and password.  Due to Covid, a mask is required upon entering the hospital/clinic. If you do not have a mask, one will be given to you upon arrival. For doctor visits, patients may have 1 support person aged 18 or older with them. For treatment visits, patients cannot have anyone with them due to current Covid guidelines and our immunocompromised population.  

## 2020-11-24 NOTE — Progress Notes (Signed)
Sertaline: yesterday, will start today in the afternoon. Will like to know if she needs to constinue her gabapentin.

## 2020-11-24 NOTE — Progress Notes (Signed)
Hematology/Oncology  Follow up note [facility] [phone] [phone]   Patient Care Team: Leonel Ramsay, MD as PCP - General (Infectious Diseases) End, [name], MD as PCP - Cardiology (Cardiology)  REFERRING PROVIDER: Leonel Ramsay, MD  CHIEF COMPLAINTS/REASON FOR VISIT:  Follow-up for amyloidosis  HISTORY OF PRESENTING ILLNESS:   Nancy Rodriguez is a  75 y.o.  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  Work up include protein electrophoresis showed M protein 0.7,   Reviewed her previous medical records via care everywhere.  She was seen by Hematology Oncology at [facility] on 02/14/2017.  07/25/2007  SPEP showed M protein of 0.35, IFE showed IgG lamda 09/22/2007  Bone survey negative.  02/15/14 IgG 930, SPEP M protien 0.22, free kappa light chain 3.59, lamda 2.92, ratio 1.23  Hypercalcemia, resolved after stopping HCTZ.  # Transaminitis 03/28/20 US abdomen showed left liver lobe Complex 1.7 cm cyst  # 04/07/20 MRI abdomen w/wo contrast showed 2 benign liver cysts, 2 nonspecific hypovascular irighr liver lesions, abnormal bone marrow signal of L1 vertebral body. Patient was  advised to proceed with bone marrow biopsy and she declined.  # referred her to established care with GI and was seen on 04/19/20,  # Liver biopsy showed amyloidosis. [name] MS/MS showed AL type # 05/20/20 recommend bone marrow biopsy which is scheduled on 05/24/2020.  Patient changed her mind and ask a biopsy to be canceled.  My team and I had multiple phone discussion with patient's daughter Nancy Rodriguez and later with the patient's son Nancy Rodriguez.  Patient agreed with bone marrow biopsy and a biopsy was scheduled on 06/09/2020.  05/20/20 NT proBNP  was normal,  TSH slightly increased, normal  T4 Normal factor X Normal coags Troponin 19. Refer to cardiology for evaluation. May need cardiac MRI  # 06/01/2020 2D echo result was reviewed and discussed with patient.  Patient has normal LVEF 60-65%. Mild asymmetric left ventricular hypertrophy.  Left ventricular diastolic parameters are indeterminate. average left ventricular global longitudinal strain is -16.8 %.  # 06/09/2020, bone marrow biopsy showed monoclonal plasmacytosis, 8%, amyloid deposit present.  Absent iron stores. 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 t (14;20) #06/17/2020, further discussed about diagnosis and treatment plan.  Patient declined bone marrow transplant evaluation.  Decision was made to proceed with Dara-CyborD chemotherapy treatments. #May 2022, second opinion at [place] with Dr. Tracey Harries.who agrees with current treatment plan.  He recommends to lower dexamethasone to 20 mg and decrease premed Tylenol to 325 mg  09/01/2020 cardiac MRI was performed at [facility].  Left ventricle is normal in size.  Mild to moderate basal septal hypertrophy.  Hyper dynamic LV function.  LVEF 71%.  Right ventricle is normal in size and wall thickness.  Systolic function normal.  Mild bi atrial enlargement, no significant aortic valve stenosis or regurgitation.  Trivial mitral regurgitation, mild tricuspid regurgitation and a trivial pulmonic regurgitation.  No evidence of MI, scarring or infiltration.  #History of major depression listed in outside problem list.  previous diagnosis and treatment details are not available to me in EMR  I have referred her to psychiatrist  #  vaginal bleeding which stopped- she was seen by GYN and recommended for biopsy.   # She has poor IV access  and was not able to receive IV Emend and IV Aloxi. She does not want to have med port.    # 10/08/2020, cervical spine showed cervical spondylosis.  Spinal canal diagnosis and a posterior disc osteophyte complex and  superimposed small central disc protrusion contact the ventral spinal cord at C4-C5.  Multilevel neuroforaminal narrowing. bilateral atlantooccipital joint effusions, multilevel grade 1 spondylolisthesis. MRI lumbar spine without contrast showed acute or subacute fracture of L4.   Moderate spinal stenosis at L3-4 has progressed. Shallow left foraminal disc protrusion has progressed. Moderate subarticular stenosis on the left also with progression  Moderate spinal stenosis L4-5 with progression. Moderate subarticular stenosis on the left with progression  # 10/25/2020 seen by [place] who recommend patient to switch Dara CyBorD to Dara RD  # 11/03/2020 Revlimid (lenalidomide) 10 mg by mouth daily for 14 days, then hold for 7 days. Pt was started on a shortened cycle in order to remain on similar schedule with infusion.   INTERVAL HISTORY Nancy Rodriguez is a 75 y.o. female who has above history reviewed by me today presents for follow up visit for management of AL amyloidosis Problems and complaints are listed below:  GERD   Dr. Ola Spurr has increased her omeprazole to 40 mg daily. She feels symptoms are improved.   11/22/2020 seen by hepatologist Dr.Kappus Rodman Key. Recommended to start Zoloft $RemoveBefo'50mg'iaIyOzLUYkf$  daily for pruritis  She does not take Lasix any more as the lower extremity swelling is better.   Review of Systems  Constitutional:  Positive for fatigue. Negative for appetite change, chills, fever and unexpected weight change.  HENT:   Negative for hearing loss, nosebleeds and voice change.   Eyes:  Positive for icterus. Negative for eye problems.  Respiratory:  Negative for chest tightness and cough.   Cardiovascular:  Positive for leg swelling. Negative for chest pain.  Gastrointestinal:  Negative for abdominal distention, abdominal pain, blood in stool and nausea.       Acid reflux  Endocrine: Negative for hot flashes.  Genitourinary:  Negative for difficulty urinating and  frequency.   Musculoskeletal:  Positive for back pain and neck pain. Negative for arthralgias.  Skin:  Negative for rash.       jaundice  Neurological:  Positive for numbness. Negative for extremity weakness.  Hematological:  Negative for adenopathy.  Psychiatric/Behavioral:  Negative for confusion.    MEDICAL HISTORY:  Past Medical History:  Diagnosis Date   Anemia    Anemia in chronic kidney disease   Chronic kidney disease    Stage 3b chronic kidney disease   Diabetes mellitus without complication (HCC)    Hypercholesterolemia    Hypertension    MGUS (monoclonal gammopathy of unknown significance)    Osteoarthritis    Rheumatoid arthritis ([place])     SURGICAL HISTORY: Past Surgical History:  Procedure Laterality Date   HERNIA REPAIR     PARATHYROIDECTOMY      SOCIAL HISTORY: Social History   Socioeconomic History   Marital status: Widowed    Spouse name: Not on file   Number of children: 6   Years of education: Not on file   Highest education level: Not on file  Occupational History   Not on file  Tobacco Use   Smoking status: Never   Smokeless tobacco: Never  Vaping Use   Vaping Use: Never used  Substance and Sexual Activity   Alcohol use: No   Drug use: Never   Sexual activity: Not on file  Other Topics Concern   Not on file  Social History Narrative   Not on file   Social Determinants of Health   Financial Resource Strain: Not on file  Food Insecurity: Not on file  Transportation Needs: Not on file  Physical Activity: Not on file  Stress: Not on file  Social Connections: Not on file  Intimate Partner Violence: Not on file    FAMILY HISTORY: Family History  Problem Relation Age of Onset   Diabetes Daughter    Diabetes Son    Dementia Mother    Cancer Father    Esophageal cancer Sister    Brain cancer Brother     ALLERGIES:  is allergic to benazepril, lisinopril, tolmetin, and nsaids.  MEDICATIONS:  Current Outpatient Medications   Medication Sig Dispense Refill   megestrol (MEGACE) 40 MG/ML suspension TAKE 10 MLS (400 MG TOTAL) BY MOUTH DAILY. 240 mL 0   acyclovir (ZOVIRAX) 400 MG tablet TAKE 1 TABLET BY MOUTH TWICE A DAY 60 tablet 5   albuterol (VENTOLIN HFA) 108 (90 Base) MCG/ACT inhaler Inhale 2 puffs into the lungs every 4 (four) hours as needed.     amLODipine (NORVASC) 5 MG tablet Take 5 mg by mouth daily.     aspirin EC 81 MG tablet Take 1 tablet (81 mg total) by mouth daily. Swallow whole. 30 tablet 0   cephALEXin (KEFLEX) 500 MG capsule Take 500 mg by mouth 3 (three) times daily.     Continuous Blood Gluc Sensor (FREESTYLE LIBRE 14 DAY SENSOR) MISC SMARTSIG:1 Kit(s) Topical Every 2 Weeks     dexamethasone (DECADRON) 4 MG tablet Take 5 tablets (20 mg total) by mouth once a week. 60 tablet 0   docusate sodium (COLACE) 100 MG capsule Take 1 capsule by mouth daily.     EPINEPHrine 0.3 mg/0.3 mL IJ SOAJ injection Inject 0.3 mg into the muscle as needed.     ferrous sulfate 325 (65 FE) MG tablet Take 1 tablet (325 mg total) by mouth 2 (two) times daily with a meal. 60 tablet 2   furosemide (LASIX) 20 MG tablet TAKE 2 TABLETS BY MOUTH DAILY. TAKE 1 TABLET DAILY FOR 3 DAYS OR UNTIL SWELLING IMPROVES (Patient taking differently: One tablet daily) 60 tablet 3   gabapentin (NEURONTIN) 100 MG capsule 1 po qHS     gabapentin (NEURONTIN) 300 MG capsule Take 300 mg by mouth at bedtime.     hydrOXYzine (ATARAX/VISTARIL) 10 MG tablet Take 10 mg by mouth 2 (two) times daily as needed.     insulin glargine (LANTUS SOLOSTAR) 100 UNIT/ML Solostar Pen Inject 17 Units into the skin 2 (two) times daily. Pt takes in morning and bedtime.     KLOR-CON M20 20 MEQ tablet TAKE 2 TABLETS BY MOUTH 2 TIMES DAILY. 120 tablet 0   lenalidomide (REVLIMID) 10 MG capsule Take 1 capsule (10 mg total) by mouth daily. Take for 21 days, then hold for 7 days. Repeat every 28 days. 21 capsule 0   LORazepam (ATIVAN) 0.5 MG tablet Take 1 tablet (0.5 mg total)  by mouth every 6 (six) hours as needed (nausea vomiting). 30 tablet 0   metoprolol succinate (TOPROL-XL) 50 MG 24 hr tablet TAKE 1 TABLET (50 MG TOTAL) BY MOUTH DAILY IN THE MORNING     mirtazapine (REMERON) 7.5 MG tablet Take 1 tablet (7.5 mg total) by mouth at bedtime. 30 tablet 2   omeprazole (PRILOSEC) 20 MG capsule Take 1 capsule (20 mg total) by mouth  daily. 30 capsule 1   omeprazole (PRILOSEC) 40 MG capsule Take by mouth.     ondansetron (ZOFRAN) 8 MG tablet TAKE 8 MG BY MOUTH 30 TO 60 MIN PRIOR TO CYTOXAN ADMINISTRATION THEN TAKE 8 MG TWICE DAILY AS NEEDED FOR NAUSEA AND VOMITING. 30 tablet 1   ondansetron (ZOFRAN) 8 MG tablet Take by mouth.     promethazine (PHENERGAN) 25 MG tablet TAKE 1 TABLET BY MOUTH EVERY 8 HOURS AS NEEDED FOR NAUSEA AND VOMITING 90 tablet 1   traMADol (ULTRAM) 50 MG tablet Take 1 tablet (50 mg total) by mouth every 6 (six) hours as needed for severe pain. 8 tablet 0   No current facility-administered medications for this visit.     PHYSICAL EXAMINATION: ECOG PERFORMANCE STATUS: 1 - Symptomatic but completely ambulatory Vitals:   11/24/20 0834  BP: (!) 145/69  Pulse: 88  Resp: 14  Temp: 97.9 F (36.6 C)  SpO2: 100%   Filed Weights   11/24/20 0834  Weight: 128 lb 15.5 oz (58.5 kg)    Physical Exam Constitutional:      General: She is not in acute distress. HENT:     Head: Normocephalic and atraumatic.  Eyes:     General: No scleral icterus. Cardiovascular:     Rate and Rhythm: Normal rate and regular rhythm.     Heart sounds: Murmur heard.  Pulmonary:     Effort: Pulmonary effort is normal. No respiratory distress.     Breath sounds: No wheezing.  Abdominal:     General: Bowel sounds are normal. There is no distension.     Palpations: Abdomen is soft.  Musculoskeletal:        General: No deformity. Normal range of motion.     Cervical back: Normal range of motion and neck supple.     Comments: Bilateral lower extremities trace edema   Skin:    General: Skin is warm and dry.     Findings: No erythema or rash.  Neurological:     Mental Status: She is alert and oriented to person, place, and time. Mental status is at baseline.     Cranial Nerves: No cranial nerve deficit.     Coordination: Coordination normal.  Psychiatric:        Mood and Affect: Mood normal.    LABORATORY DATA:  I have reviewed the data as listed Lab Results  Component Value Date   WBC 6.7 11/10/2020   HGB 8.7 (L) 11/10/2020   HCT 24.4 (L) 11/10/2020   MCV 74.8 (L) 11/10/2020   PLT 322 11/10/2020   Recent Labs    10/20/20 0831 10/27/20 0807 11/10/20 0915  NA 134* 134* 133*  K 3.5 3.0* 3.4*  CL 103 102 101  CO2 $Re'24 25 25  'xKM$ GLUCOSE 141* 232* 275*  BUN $Re'16 12 14  'UXT$ CREATININE 1.02* 0.80 0.89  CALCIUM 9.0 8.9 8.4*  GFRNONAA 57* >60 >60  PROT 5.6* 6.2* 5.3*  ALBUMIN 2.1* 2.4* 2.1*  AST 70* 58* 46*  ALT 46* 45* 34  ALKPHOS 1,159* 1,117* 889*  BILITOT 4.0* 4.8* 3.6*    Iron/TIBC/Ferritin/ %Sat    Component Value Date/Time   IRON 35 08/31/2020 1037   TIBC 290 08/31/2020 1037   FERRITIN 82 08/31/2020 1037   IRONPCTSAT 12 08/31/2020 1037     08/25/2019, platelet count 491, WBC 7.5, hemoglobin 12 Creatinine 1.58, EGFR 37, calcium 10.8, albumin 4.2 Negative hepatitis B surface antigen, hepatitis B core antibody, Negative hepatitis C 08/05/2019, A1c 11.2  RADIOGRAPHIC STUDIES: I have personally reviewed the radiological images as listed and agreed with the findings in the report. CT ABDOMEN PELVIS WO CONTRAST  Result Date: 09/02/2020 CLINICAL DATA:  Light chain amyloidosis. Weight loss. Abdominal distension. EXAM: CT ABDOMEN AND PELVIS WITHOUT CONTRAST TECHNIQUE: Multidetector CT imaging of the abdomen and pelvis was performed following the standard protocol without IV contrast. COMPARISON:  MRI abdomen 04/07/2020 FINDINGS: Lower chest: Lung bases are clear. Hepatobiliary: No focal hepatic lesion on noncontrast exam. Gallstones present  in a collapsed gallbladder. Caudate lobe is prominent. Fluid along the RIGHT hepatic margin. Benign cyst in LEFT hepatic lobe. Pancreas: Pancreas is normal. No ductal dilatation. No pancreatic inflammation. Spleen: Normal spleen Adrenals/urinary tract: Adrenal glands normal. No nephrolithiasis ureterolithiasis. No obstructive uropathy. The density of the urine in the bladder is greater than typical (HU equal 27). No IV contrast administered. Stomach/Bowel: Stomach, small bowel, appendix, and cecum are normal. The colon and rectosigmoid colon are normal. Vascular/Lymphatic: Abdominal aorta is normal caliber with atherosclerotic calcification. There is no retroperitoneal or periportal lymphadenopathy. No pelvic lymphadenopathy. Reproductive: Uterus and adnexa unremarkable. Other: Moderate volume of free fluid along the margin liver and collecting in the pelvis. Focal of fluid simple fluid attenuation without organization. Musculoskeletal: No aggressive osseous lesion. IMPRESSION: 1. High-density urine within the bladder. Recommend urinalysis and correlation for cystitis or hematuria. 2. Moderate volume of free fluid in the RIGHT abdomen and pelvis is favored ascites. Recommend correlation with liver function. 3. Cholelithiasis without evidence cholecystitis. These results will be called to the ordering clinician or representative by the Radiologist Assistant, and communication documented in the PACS or [employer]. Electronically Signed   By: Suzy Bouchard M.D.   On: 09/02/2020 09:25   DG Lumbar Spine Complete  Result Date: 09/23/2020 CLINICAL DATA:  Acute low back pain. EXAM: LUMBAR SPINE - COMPLETE 4+ VIEW COMPARISON:  None. FINDINGS: Mildly displaced fracture is seen involving the L4 vertebral body. Mild grade 1 anterolisthesis of L4-5 is noted secondary to posterior facet joint hypertrophy. Mild degenerative disc disease is noted at L1-2, L2-3 and L5-S1 with anterior osteophyte formation. IMPRESSION:  Mildly displaced acute fracture is seen involving the L4 vertebral body. These results will be called to the ordering clinician or representative by the Radiologist Assistant, and communication documented in the PACS or [employer]. Aortic Atherosclerosis (ICD10-I70.0). Electronically Signed   By: Marijo Conception M.D.   On: 09/23/2020 14:44   MR CERVICAL SPINE WO CONTRAST  Result Date: 10/10/2020 CLINICAL DATA:  Cervical myelopathy. Additional history provided by scanning technologist: Patient reports fall June 3rd resulting in shoulder dislocation, numbness of left 4/5 fingers, difficulty with movement. EXAM: MRI CERVICAL SPINE WITHOUT CONTRAST TECHNIQUE: Multiplanar, multisequence MR imaging of the cervical spine was performed. No intravenous contrast was administered. COMPARISON:  No pertinent prior exams available for comparison. FINDINGS: Alignment: Reversal of the expected cervical lordosis. Trace C2-C3 grade 1 anterolisthesis. 2 mm C3-C4 grade 1 anterolisthesis. Trace C5-C6 grade 1 retrolisthesis. Trace C7-T1 grade 1 anterolisthesis. Vertebrae: Vertebral body height is maintained. No significant marrow edema or focal suspicious osseous lesion. Multilevel ventral osteophytes. Cord: No spinal cord signal abnormality is identified. Posterior Fossa, vertebral arteries, paraspinal tissues: Incidentally noted partially empty sella turcica. No abnormality identified within included portions of the posterior fossa. Flow voids preserved within the imaged cervical vertebral arteries. Paraspinal soft tissues within normal limits. Small bilateral atlantooccipital joint effusions. Disc levels: Moderate disc degeneration at C4-C5, C5-C6 and C6-C7. Mild disc degeneration at the remaining  levels. C2-C3: Trace grade 1 anterolisthesis. Disc uncovering with tiny right center disc protrusion. Minimal facet arthrosis. No significant spinal canal or foraminal stenosis. C3-C4: Grade 1 anterolisthesis. Disc uncovering with  small central disc protrusion. Uncovertebral hypertrophy. The disc protrusion mildly effaces the ventral thecal sac without significant spinal cord mass effect. Bilateral neural foraminal narrowing (mild right, moderate left). C4-C5: Posterior disc osteophyte complex with bilateral disc osteophyte ridge/uncinate hypertrophy. Superimposed small central disc protrusion. Mild spinal canal narrowing with contact upon the ventral spinal cord. Moderate/severe bilateral neural foraminal narrowing (greater on the right). C5-C6: Trace grade 1 retrolisthesis. Disc bulge. Superimposed small central disc extrusion with cranial migration to the mid L5 vertebral body level (series 9, image 8). Right-sided disc osteophyte ridge/uncinate hypertrophy. Mild relative spinal canal narrowing. Severe right neural foraminal narrowing. C6-C7: Disc bulge with bilateral disc osteophyte ridge/uncinate hypertrophy. Mild spinal canal narrowing. Bilateral neural foraminal narrowing (moderate/severe right, moderate left). C7-T1: Trace grade 1 anterolisthesis. Slight disc uncovering. Superimposed small central disc protrusion. Minimal facet arthrosis minimal partial effacement of the ventral thecal sac without spinal cord mass effect. No significant foraminal narrowing. T1-T2: Imaged sagittally. Small central disc protrusion. Mild effacement of the ventral thecal sac without appreciable spinal cord mass effect. No significant foraminal stenosis. T2-T3: Trace grade 1 anterolisthesis. No appreciable significant disc herniation or spinal canal stenosis. Facet arthrosis/ligamentum flavum hypertrophy. Mild bilateral neural foraminal narrowing (greater on the right). IMPRESSION: Cervical spondylosis, as outlined. No more than mild spinal canal stenosis. Most notably, a posterior disc osteophyte complex and superimposed small central disc protrusion contact the ventral spinal cord at C4-C5. Multilevel neural foraminal narrowing, as detailed and greatest  on the left at C3-C4 (moderate), bilaterally at C4-C5 (moderate/severe) and bilaterally at C6-C7 (moderate/severe right, moderate left). Reversal of the expected cervical lordosis. Mild multilevel grade 1 spondylolisthesis, as above. Small bilateral atlantooccipital joint effusions. Electronically Signed   By: Kellie Simmering DO   On: 10/10/2020 10:42   MR LUMBAR SPINE WO CONTRAST  Result Date: 10/10/2020 CLINICAL DATA:  Closed compression fracture L4 vertebral body. EXAM: MRI LUMBAR SPINE WITHOUT CONTRAST TECHNIQUE: Multiplanar, multisequence MR imaging of the lumbar spine was performed. No intravenous contrast was administered. COMPARISON:  Lumbar radiographs 09/21/2020.  Lumbar MRI 05/11/2020 FINDINGS: Segmentation:  Normal Alignment: Mild retrolisthesis L1-2, L2-3, L3-4. Mild anterolisthesis L4-5 Vertebrae: Moderate compression fracture of L4. No underlying lesion is suspected. There is mild diffuse bone marrow edema. No significant retropulsion of bone into the canal. Fracture appears to involve the superior and inferior endplates. 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 No additional fracture Conus medullaris and cauda equina: Conus extends to the L2 level. Conus and cauda equina appear normal. Paraspinal and other soft tissues: Negative for paraspinous mass or adenopathy or fluid collection. Mild paraspinous edema around the L4 fracture. Disc levels: T12-L1: Small central disc protrusion unchanged from the prior MRI. No significant stenosis L1-2: Mild retrolisthesis.  Mild disc degeneration without stenosis L2-3: Mild retrolisthesis. Disc bulging and mild facet degeneration. Negative for stenosis L3-4: Disc bulging and diffuse endplate spurring. Shallow left foraminal disc protrusion has progressed. Bilateral facet hypertrophy. Progression of moderate spinal stenosis and moderate subarticular stenosis on the left. L4-5: Mild anterolisthesis. Central disc protrusion unchanged. Mild left foraminal  disc protrusion unchanged. Advanced facet degeneration. Moderate spinal stenosis with progression. Moderate subarticular stenosis on the left with progression. L5-S1: Disc degeneration with diffuse endplate spurring. Mild foraminal stenosis bilaterally unchanged. IMPRESSION: 1. Moderately severe acute or subacute fracture of L4 without significant  retropulsion of bone into the canal. Large hemangioma L1. No other fracture 2. Moderate spinal stenosis at L3-4 has progressed. Shallow left foraminal disc protrusion has progressed. Moderate subarticular stenosis on the left also with progression 3. Moderate spinal stenosis L4-5 with progression. Moderate subarticular stenosis on the left with progression. Electronically Signed   By: Franchot Gallo M.D.   On: 10/10/2020 10:41   DG Shoulder Left  Result Date: 09/15/2020 CLINICAL DATA:  75 year old female with anterior left shoulder dislocation after fall. Post reduction. EXAM: LEFT SHOULDER - 2+ VIEW COMPARISON:  0907 hours today. FINDINGS: Normal glenohumeral joint alignment now. Proximal left humerus and scapula appear intact. Mild degenerative changes at the left [facility] joint. Left clavicle and visible left ribs appear intact. IMPRESSION: Reduction of the left shoulder dislocation with no fracture identified. Electronically Signed   By: Genevie Ann M.D.   On: 09/15/2020 11:11   DG Shoulder Left  Result Date: 09/15/2020 CLINICAL DATA:  75 year old female status post fall and pain. EXAM: LEFT SHOULDER - 2+ VIEW COMPARISON:  Left shoulder series 11/30/2014. FINDINGS: Anterior glenohumeral dislocation. No acute fracture identified. Left clavicle and scapula appear to remain normally aligned. Visible left ribs appear intact. IMPRESSION: Anterior left glenohumeral dislocation.  No fracture identified. Electronically Signed   By: Genevie Ann M.D.   On: 09/15/2020 09:25   US Abdomen Limited RUQ (LIVER/GB)  Result Date: 10/31/2020 CLINICAL DATA:  Hyperbilirubinemia. EXAM:  ULTRASOUND ABDOMEN LIMITED RIGHT UPPER QUADRANT COMPARISON:  CT of the abdomen and pelvis without contrast 09/02/2020. Right upper quadrant ultrasound 08/25/2020. FINDINGS: Gallbladder: Large gallstones scratched at large shadowing gallstones noted. These measure up to 9 mm. Gallbladder is contracted. Wall thickness measures 5 mm, likely due to contraction. No sonographic Percell Miller sign is present. Common bile duct: Diameter: 4 mm Liver: Simple cyst the left lobe measures up to 1.4 cm. Hepatic parenchyma within normal limits. Portal vein is patent on color Doppler imaging with normal direction of blood flow towards the liver. Other: Small amount of ascites is noted. IMPRESSION: 1. Cholelithiasis without evidence for acute cholecystitis. 2. Small amount of ascites. 3. Normal sonographic appearance of the liver.  Stable cyst. Electronically Signed   By: San Morelle M.D.   On: 10/31/2020 09:46       ASSESSMENT & PLAN:  1. Light chain (AL) amyloidosis (HCC)   2. Encounter for antineoplastic chemotherapy   3. 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[place])   4. Hyperbilirubinemia   5. Elevated LFTs   6. 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 Start cycle 2 Revlimid 10 mg D1-21 days Proceed with Daratumumab today.  Continue aspirin 81 mg daily.  She may use over-the-counter supply.  #Elevated LFT, due to amyloidosis liver involvement.  Ultrasound showed small amount of ascites, stable liver cyst. Bilirubin is improved since dc of Velcade. Stable at 3.6.  #GERD, continue omeprazole 40 mg daily. 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   #Chemotherapy-induced nausea, stable symptoms.   #Lack of IV access, discussed about option of Mediport placement.  She declined.  #Hypokalemia due to lasix, potassium level 3.4. continue  potassium to 76meq twice daily potassium 3.4 today. Marland Kitchen   #Chronic kidney disease, probably AL involvement follow-up with nephrology.  Stable creatinine. Repeat 24-hour urine protein, ordered and she did not do,  Follow up with nephrology  #Anemia is multifactorial, from CKD, marrow suppression and iron deficiency. Microcytic  anemia likely due to iron deficiency, continue.  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  Hb is improved.   #Anxiety, recommend patient to take Remeron-unclear if patient is following the recommendation.  # Pruritis, start Zoloft per hepatologist.   #Unintentional weight loss, symptoms improved after started on Megace.  Continue Megace as needed for appetite stimulant.  #Bilateral lower extremity swelling, neg DVT.  Improved, off Lasix.   # right great toe dragging and numbness. 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 Continue  gabapentin.   We spent sufficient time to discuss many aspect of care, questions were answered to patient's satisfaction.  Lab MD 2 weeks, Daratumab cc Leonel Ramsay, MD   [name], MD, PhD Hematology Oncology [facility] Pager- [phone] 11/24/2020

## 2020-11-25 LAB — BETA 2 MICROGLOBULIN, SERUM: Beta-2 Microglobulin: 3.1 mg/L — ABNORMAL HIGH (ref 0.6–2.4)

## 2020-11-25 LAB — KAPPA/LAMBDA LIGHT CHAINS
Kappa free light chain: 12.7 mg/L (ref 3.3–19.4)
Kappa, lambda light chain ratio: 1.2 (ref 0.26–1.65)
Lambda free light chains: 10.6 mg/L (ref 5.7–26.3)

## 2020-11-28 ENCOUNTER — Telehealth: Payer: Self-pay | Admitting: *Deleted

## 2020-11-28 ENCOUNTER — Telehealth: Payer: Self-pay

## 2020-11-28 ENCOUNTER — Other Ambulatory Visit: Payer: Self-pay

## 2020-11-28 DIAGNOSIS — E8581 Light chain (AL) amyloidosis: Secondary | ICD-10-CM

## 2020-11-28 MED ORDER — LENALIDOMIDE 10 MG PO CAPS
10.0000 mg | ORAL_CAPSULE | Freq: Every day | ORAL | 0 refills | Status: DC
Start: 2020-11-28 — End: 2020-12-01

## 2020-11-28 NOTE — Telephone Encounter (Signed)
Refill sent.

## 2020-11-28 NOTE — Telephone Encounter (Signed)
Pharmacy requests refill for Revlimid to be faxed to pharmacy.

## 2020-11-28 NOTE — Telephone Encounter (Signed)
Nutrition  Called patient for nutrition follow-up.  Called highlighted number in chart [phone]) and daughter answered.  Reports that patient is in an appointment and unable to talk.  RD call back number left with daughter.    Nancy Rodriguez B. Zenia Resides, [place], [place] Registered Dietitian [phone] (mobile)

## 2020-11-29 LAB — MULTIPLE MYELOMA PANEL, SERUM
Albumin SerPl Elph-Mcnc: 2.1 g/dL — ABNORMAL LOW (ref 2.9–4.4)
Albumin/Glob SerPl: 0.8 (ref 0.7–1.7)
Alpha 1: 0.4 g/dL (ref 0.0–0.4)
Alpha2 Glob SerPl Elph-Mcnc: 0.9 g/dL (ref 0.4–1.0)
B-Globulin SerPl Elph-Mcnc: 1 g/dL (ref 0.7–1.3)
Gamma Glob SerPl Elph-Mcnc: 0.6 g/dL (ref 0.4–1.8)
Globulin, Total: 2.9 g/dL (ref 2.2–3.9)
IgA: 119 mg/dL (ref 64–422)
IgG (Immunoglobin G), Serum: 549 mg/dL — ABNORMAL LOW (ref 586–1602)
IgM (Immunoglobulin M), Srm: 75 mg/dL (ref 26–217)
M Protein SerPl Elph-Mcnc: 0.2 g/dL — ABNORMAL HIGH
Total Protein ELP: 5 g/dL — ABNORMAL LOW (ref 6.0–8.5)

## 2020-12-01 ENCOUNTER — Ambulatory Visit: Payer: Medicare Other

## 2020-12-01 ENCOUNTER — Ambulatory Visit
Admission: RE | Admit: 2020-12-01 | Discharge: 2020-12-01 | Disposition: A | Discharge status: Home / Self Care | Payer: Medicare Other | Source: Ambulatory Visit | Attending: Infectious Diseases | Admitting: Infectious Diseases

## 2020-12-01 ENCOUNTER — Other Ambulatory Visit: Payer: Self-pay

## 2020-12-01 ENCOUNTER — Other Ambulatory Visit: Payer: Medicare Other

## 2020-12-01 ENCOUNTER — Ambulatory Visit: Payer: Medicare Other | Admitting: Oncology

## 2020-12-01 ENCOUNTER — Other Ambulatory Visit: Payer: Self-pay | Admitting: Pharmacist

## 2020-12-01 DIAGNOSIS — Z1231 Encounter for screening mammogram for malignant neoplasm of breast: Secondary | ICD-10-CM | POA: Insufficient documentation

## 2020-12-01 DIAGNOSIS — E8581 Light chain (AL) amyloidosis: Secondary | ICD-10-CM

## 2020-12-01 IMAGING — MG MM DIGITAL SCREENING BILAT W/ TOMO AND CAD
8 of 14 series · 9 of 40 positions shown · non-contrast
Comparison: Previous exam(s).

CLINICAL DATA: Screening.

EXAM:
DIGITAL SCREENING BILATERAL MAMMOGRAM WITH TOMOSYNTHESIS AND CAD
TECHNIQUE: Bilateral screening digital craniocaudal and mediolateral oblique
mammograms were obtained. Bilateral screening digital breast
tomosynthesis was performed. The images were evaluated with
computer-aided detection.

[R MLO synth-2D (1 of 3)]
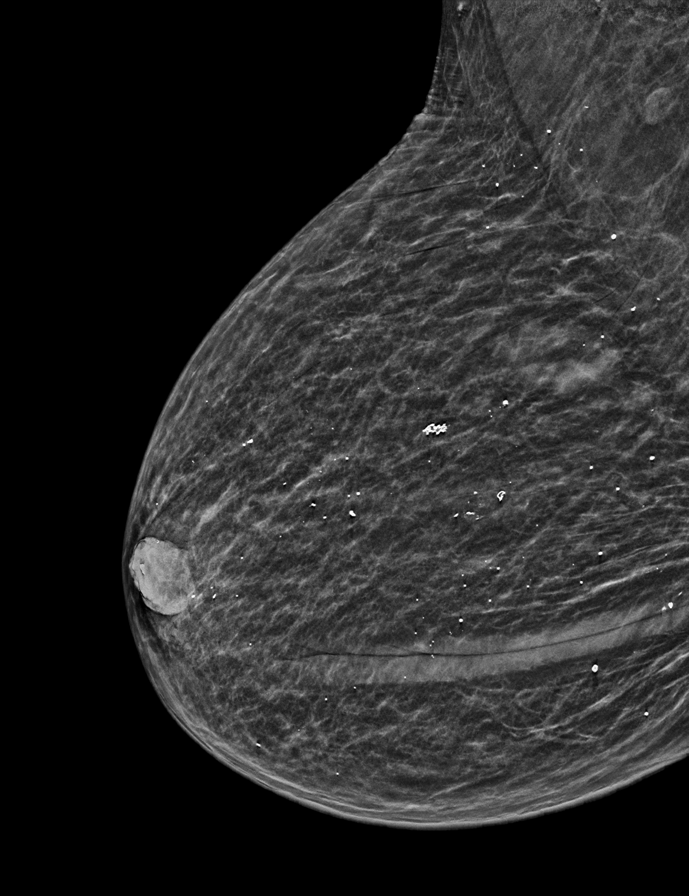

[R MLO synth-2D (2 of 3)]
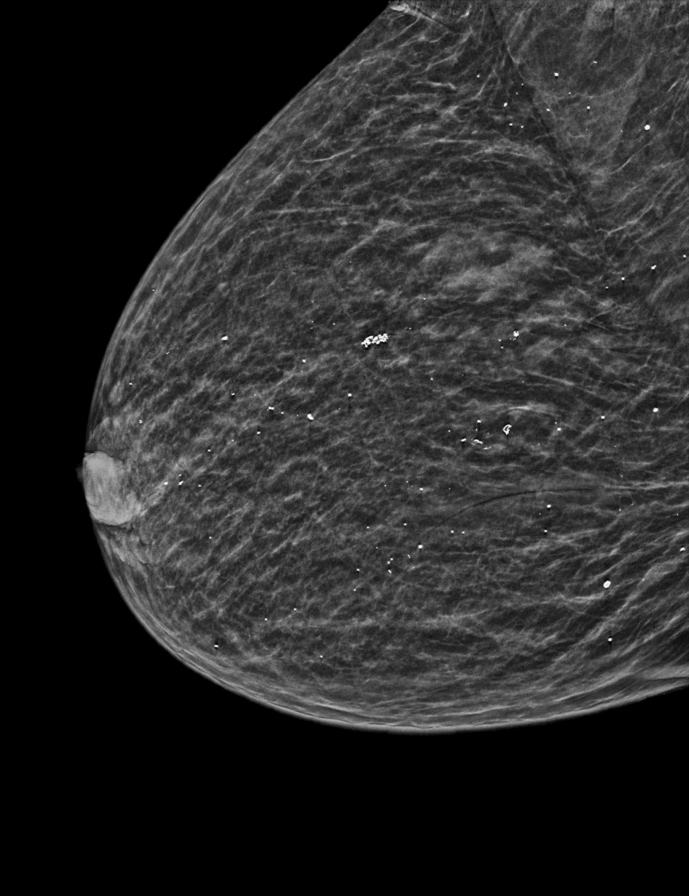

[R MLO synth-2D (3 of 3)]
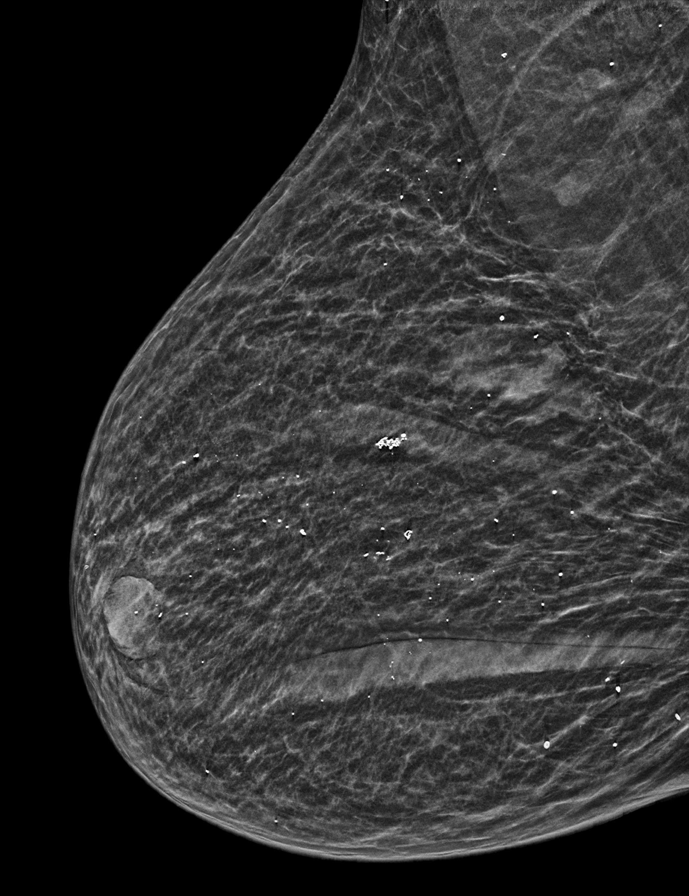

[R CC synth-2D]
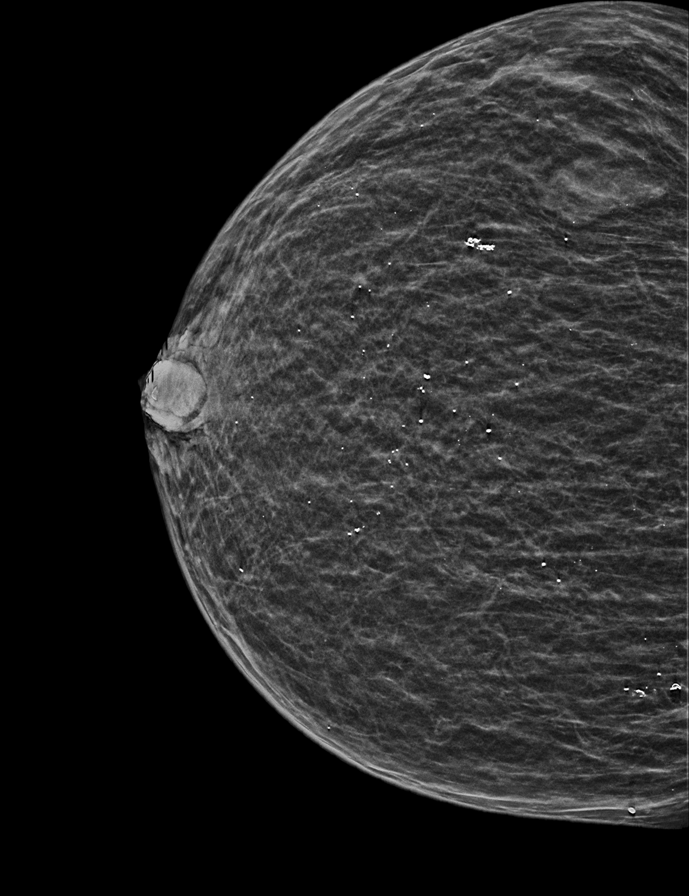

[L CC synth-2D]
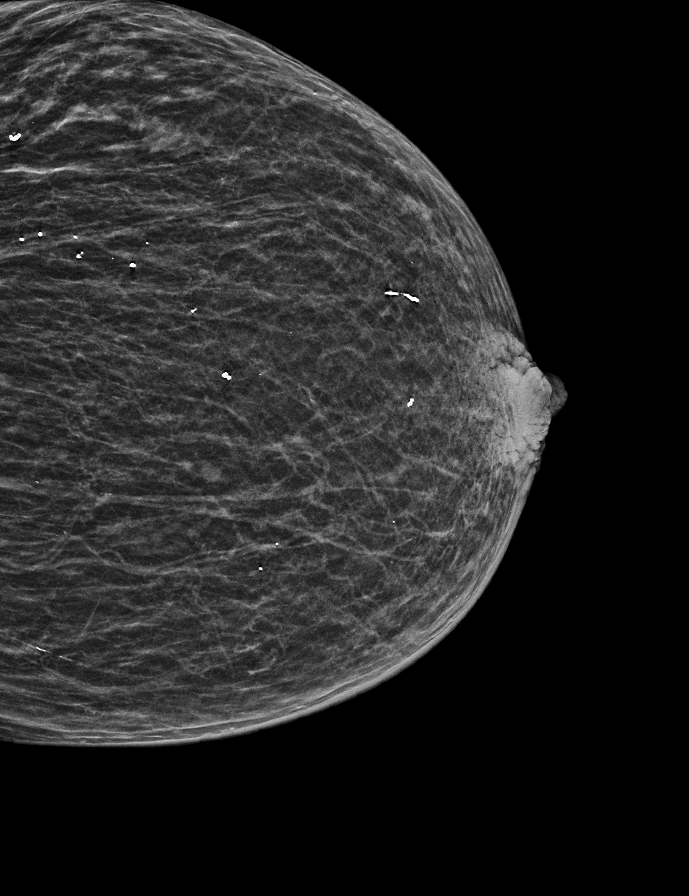

[L MLO synth-2D (1 of 2)]
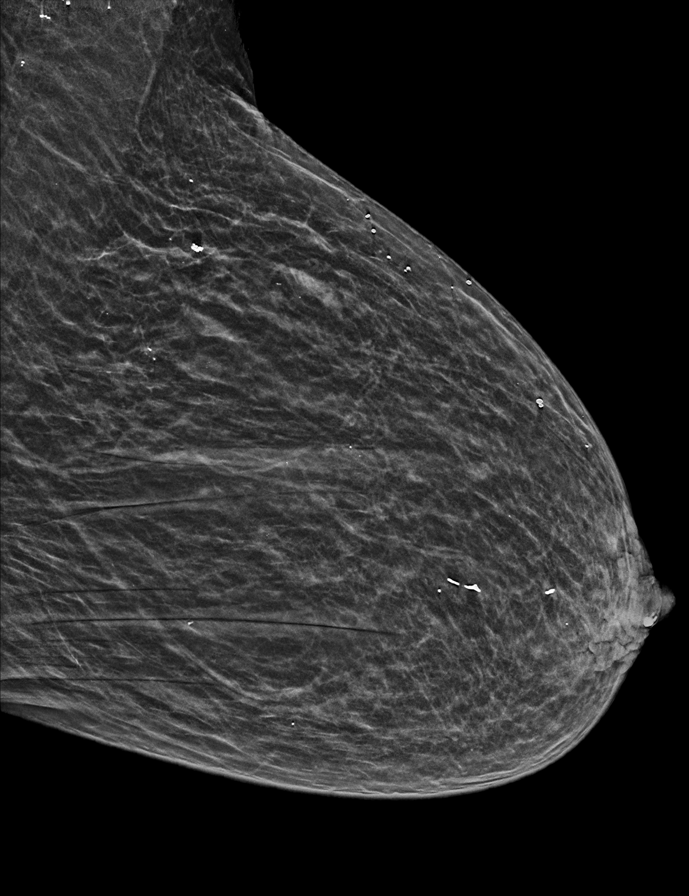

[L MLO synth-2D (2 of 2)]
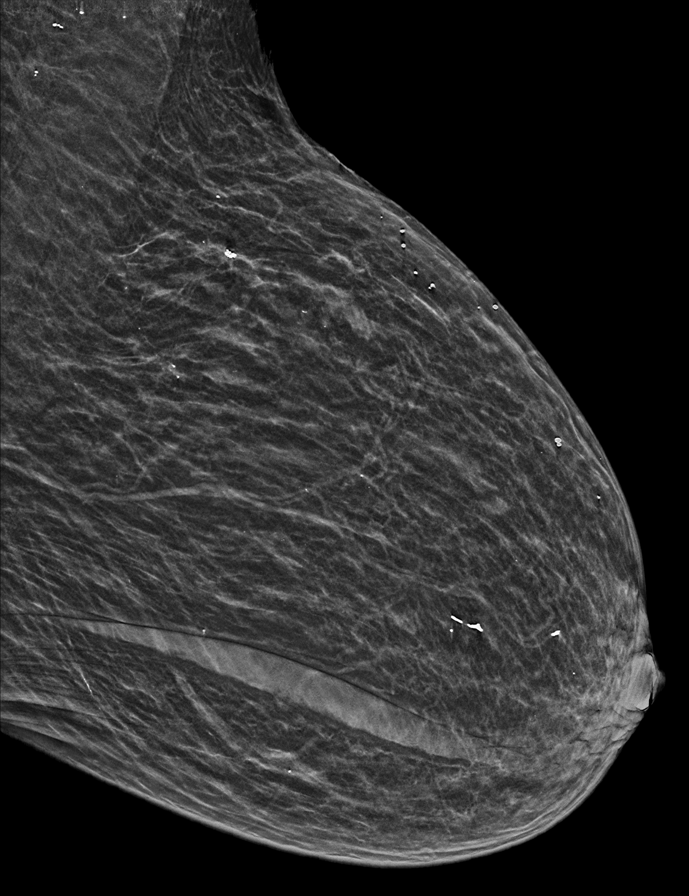

[L MLO tomo · 2 of 33 frames shown]
[frame 11/33]
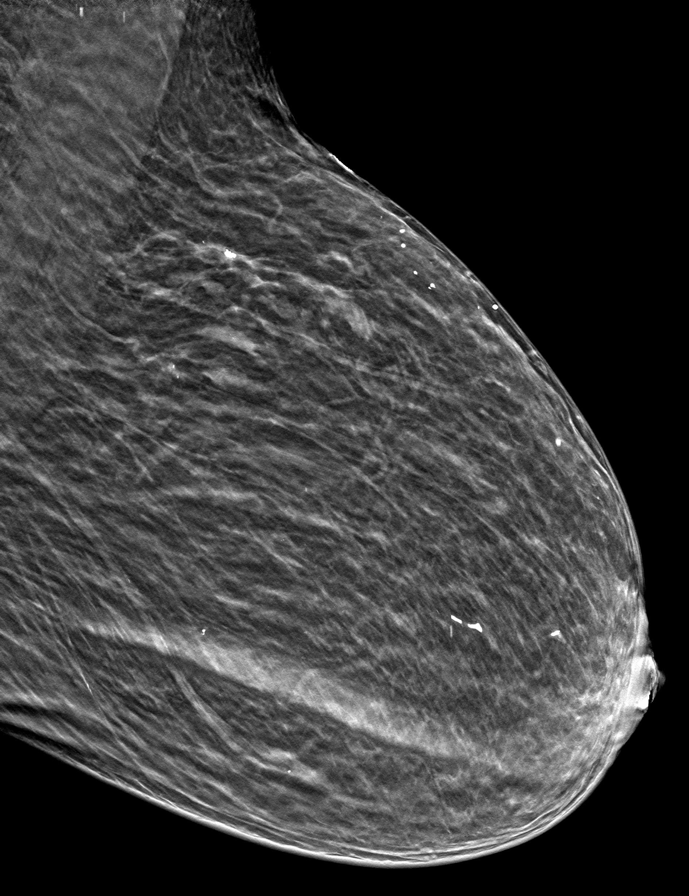
[frame 17/33]
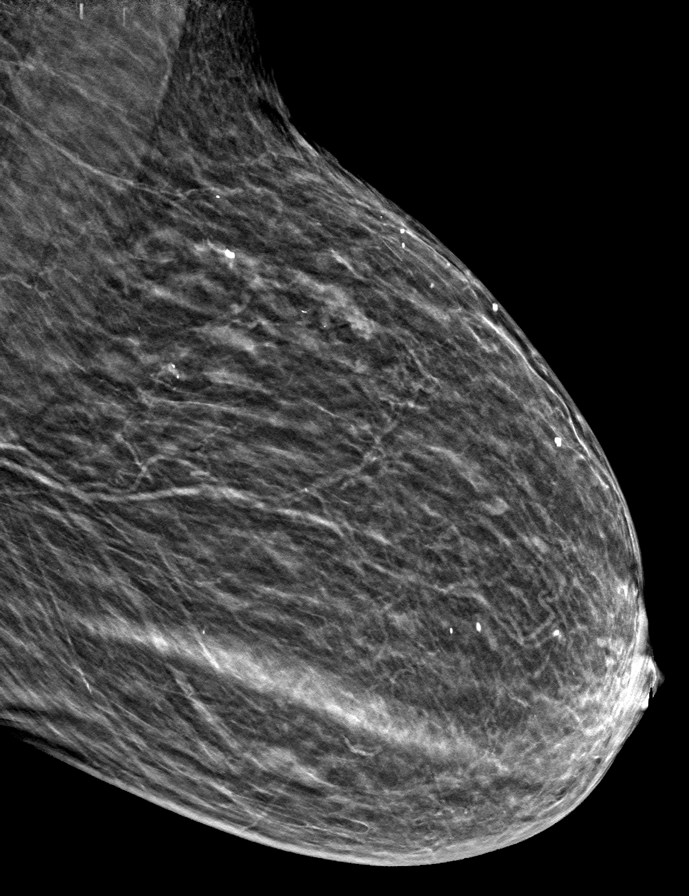

[9 of 40 positions shown; findings below may reference images not displayed]

ACR Breast Density Category c: The breast tissue is heterogeneously
dense, which may obscure small masses.
FINDINGS: There are no findings suspicious for malignancy.
IMPRESSION: No mammographic evidence of malignancy. A result letter of this
screening mammogram will be mailed directly to the patient.

RECOMMENDATION:
Screening mammogram in one year. (Code:[V2])

BI-RADS CATEGORY  1: Negative.

## 2020-12-01 MED ORDER — LENALIDOMIDE 10 MG PO CAPS
10.0000 mg | ORAL_CAPSULE | Freq: Every day | ORAL | 0 refills | Status: DC
Start: 2020-12-01 — End: 2020-12-02

## 2020-12-01 NOTE — Progress Notes (Signed)
Oral Chemotherapy Pharmacist Encounter   Patient called to report she only had 2 days left of Revlimid including today. She was able to start her cycle 2 with the left over capsules from cycle 1. Prescription for the remaining capsules she needed was sent to [facility].   Darl Pikes, PharmD, BCPS, BCOP, CPP Hematology/Oncology Clinical Pharmacist [facility] Oral [facility] [phone]  12/01/2020 4:13 PM

## 2020-12-02 MED ORDER — LENALIDOMIDE 10 MG PO CAPS
10.0000 mg | ORAL_CAPSULE | Freq: Every day | ORAL | 0 refills | Status: DC
Start: 2020-12-02 — End: 2020-12-15

## 2020-12-02 NOTE — Addendum Note (Signed)
Addended by: Darl Pikes on: 12/02/2020 01:43 PM   Modules accepted: Orders

## 2020-12-08 ENCOUNTER — Inpatient Hospital Stay: Payer: Medicare Other

## 2020-12-08 ENCOUNTER — Other Ambulatory Visit: Payer: Self-pay | Admitting: *Deleted

## 2020-12-08 ENCOUNTER — Encounter: Payer: Self-pay | Admitting: Oncology

## 2020-12-08 ENCOUNTER — Other Ambulatory Visit: Payer: Self-pay | Admitting: Oncology

## 2020-12-08 ENCOUNTER — Other Ambulatory Visit: Payer: Self-pay

## 2020-12-08 ENCOUNTER — Inpatient Hospital Stay (HOSPITAL_BASED_OUTPATIENT_CLINIC_OR_DEPARTMENT_OTHER): Payer: Medicare Other | Admitting: Oncology

## 2020-12-08 VITALS — BP 135/55 | HR 72 | Temp 97.4°F | Resp 14 | Wt 129.2 lb

## 2020-12-08 DIAGNOSIS — E8581 Light chain (AL) amyloidosis: Secondary | ICD-10-CM | POA: Diagnosis not present

## 2020-12-08 DIAGNOSIS — N939 Abnormal uterine and vaginal bleeding, unspecified: Secondary | ICD-10-CM | POA: Diagnosis not present

## 2020-12-08 DIAGNOSIS — D631 Anemia in chronic kidney disease: Secondary | ICD-10-CM

## 2020-12-08 DIAGNOSIS — Z5111 Encounter for antineoplastic chemotherapy: Secondary | ICD-10-CM

## 2020-12-08 DIAGNOSIS — N1831 Chronic kidney disease, stage 3a: Secondary | ICD-10-CM

## 2020-12-08 DIAGNOSIS — K219 Gastro-esophageal reflux disease without esophagitis: Secondary | ICD-10-CM

## 2020-12-08 DIAGNOSIS — E859 Amyloidosis, unspecified: Secondary | ICD-10-CM

## 2020-12-08 LAB — CBC WITH DIFFERENTIAL/PLATELET
Abs Immature Granulocytes: 0.04 10*3/uL (ref 0.00–0.07)
Basophils Absolute: 0 10*3/uL (ref 0.0–0.1)
Basophils Relative: 1 %
Eosinophils Absolute: 0.3 10*3/uL (ref 0.0–0.5)
Eosinophils Relative: 6 %
HCT: 21.6 % — ABNORMAL LOW (ref 36.0–46.0)
Hemoglobin: 7.7 g/dL — ABNORMAL LOW (ref 12.0–15.0)
Immature Granulocytes: 1 %
Lymphocytes Relative: 17 %
Lymphs Abs: 1 10*3/uL (ref 0.7–4.0)
MCH: 26.6 pg (ref 26.0–34.0)
MCHC: 35.6 g/dL (ref 30.0–36.0)
MCV: 74.7 fL — ABNORMAL LOW (ref 80.0–100.0)
Monocytes Absolute: 0.4 10*3/uL (ref 0.1–1.0)
Monocytes Relative: 7 %
Neutro Abs: 3.9 10*3/uL (ref 1.7–7.7)
Neutrophils Relative %: 68 %
Platelets: 340 10*3/uL (ref 150–400)
RBC: 2.89 MIL/uL — ABNORMAL LOW (ref 3.87–5.11)
RDW: 22.9 % — ABNORMAL HIGH (ref 11.5–15.5)
WBC: 5.7 10*3/uL (ref 4.0–10.5)
nRBC: 0 % (ref 0.0–0.2)

## 2020-12-08 LAB — COMPREHENSIVE METABOLIC PANEL
ALT: 32 U/L (ref 0–44)
AST: 37 U/L (ref 15–41)
Albumin: 1.9 g/dL — ABNORMAL LOW (ref 3.5–5.0)
Alkaline Phosphatase: 805 U/L — ABNORMAL HIGH (ref 38–126)
Anion gap: 6 (ref 5–15)
BUN: 9 mg/dL (ref 8–23)
CO2: 25 mmol/L (ref 22–32)
Calcium: 8.2 mg/dL — ABNORMAL LOW (ref 8.9–10.3)
Chloride: 101 mmol/L (ref 98–111)
Creatinine, Ser: 0.78 mg/dL (ref 0.44–1.00)
GFR, Estimated: >60 mL/min (ref >60)
Glucose, Bld: 226 mg/dL — ABNORMAL HIGH (ref 70–99)
Potassium: 3.2 mmol/L — ABNORMAL LOW (ref 3.5–5.1)
Sodium: 132 mmol/L — ABNORMAL LOW (ref 135–145)
Total Bilirubin: 3.2 mg/dL — ABNORMAL HIGH (ref 0.3–1.2)
Total Protein: 5.2 g/dL — ABNORMAL LOW (ref 6.5–8.1)

## 2020-12-08 MED ORDER — ACETAMINOPHEN 325 MG PO TABS
325.0000 mg | ORAL_TABLET | Freq: Once | ORAL | Status: AC
  Administered 2020-12-08: 325 mg via ORAL
  Filled 2020-12-08: qty 1

## 2020-12-08 MED ORDER — DEXAMETHASONE 4 MG PO TABS
20.0000 mg | ORAL_TABLET | Freq: Once | ORAL | Status: AC
  Administered 2020-12-08: 20 mg via ORAL
  Filled 2020-12-08: qty 5

## 2020-12-08 MED ORDER — DIPHENHYDRAMINE HCL 25 MG PO CAPS
50.0000 mg | ORAL_CAPSULE | Freq: Once | ORAL | Status: AC
  Administered 2020-12-08: 50 mg via ORAL
  Filled 2020-12-08: qty 2

## 2020-12-08 MED ORDER — DARATUMUMAB-HYALURONIDASE-FIHJ 1800-30000 MG-UT/15ML ~~LOC~~ SOLN
1800.0000 mg | Freq: Once | SUBCUTANEOUS | Status: AC
  Administered 2020-12-08: 1800 mg via SUBCUTANEOUS
  Filled 2020-12-08: qty 15

## 2020-12-08 NOTE — Progress Notes (Signed)
Hematology/Oncology  Follow up note [facility] [phone] [phone]   Patient Care Team: Leonel Ramsay, MD as PCP - General (Infectious Diseases) End, [name], MD as PCP - Cardiology (Cardiology)  REFERRING PROVIDER: Leonel Ramsay, MD  CHIEF COMPLAINTS/REASON FOR VISIT:  Follow-up for amyloidosis  HISTORY OF PRESENTING ILLNESS:   Nancy Rodriguez is a  75 y.o.  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  Work up include protein electrophoresis showed M protein 0.7,   Reviewed her previous medical records via care everywhere.  She was seen by Hematology Oncology at [facility] on 02/14/2017.  07/25/2007  SPEP showed M protein of 0.35, IFE showed IgG lamda 09/22/2007  Bone survey negative.  02/15/14 IgG 930, SPEP M protien 0.22, free kappa light chain 3.59, lamda 2.92, ratio 1.23  Hypercalcemia, resolved after stopping HCTZ.  # Transaminitis 03/28/20 US abdomen showed left liver lobe Complex 1.7 cm cyst  # 04/07/20 MRI abdomen w/wo contrast showed 2 benign liver cysts, 2 nonspecific hypovascular irighr liver lesions, abnormal bone marrow signal of L1 vertebral body. Patient was  advised to proceed with bone marrow biopsy and she declined.  # referred her to established care with GI and was seen on 04/19/20,  # Liver biopsy showed amyloidosis. [name] MS/MS showed AL type # 05/20/20 recommend bone marrow biopsy which is scheduled on 05/24/2020.  Patient changed her mind and ask a biopsy to be canceled.  My team and I had multiple phone discussion with patient's daughter [name] and later with [name].  Patient agreed with bone marrow biopsy and a biopsy was scheduled on 06/09/2020.  05/20/20 NT proBNP  was normal,  TSH slightly increased, normal  T4 Normal factor X Normal coags Troponin 19. Refer to cardiology for evaluation. May need cardiac MRI  # 06/01/2020 2D echo result was reviewed and discussed with patient.  Patient has normal LVEF 60-65%. Mild asymmetric left ventricular hypertrophy.  Left ventricular diastolic parameters are indeterminate. average left ventricular global longitudinal strain is -16.8 %.  # 06/09/2020, bone marrow biopsy showed monoclonal plasmacytosis, 8%, amyloid deposit present.  Absent iron stores. 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 t (14;20) #06/17/2020, further discussed about diagnosis and treatment plan.  Patient declined bone marrow transplant evaluation.  Decision was made to proceed with Dara-CyborD chemotherapy treatments. #May 2022, second opinion at [place] with Dr. Tracey Harries.who agrees with current treatment plan.  He recommends to lower dexamethasone to 20 mg and decrease premed Tylenol to 325 mg  09/01/2020 cardiac MRI was performed at [facility].  Left ventricle is normal in size.  Mild to moderate basal septal hypertrophy.  Hyper dynamic LV function.  LVEF 71%.  Right ventricle is normal in size and wall thickness.  Systolic function normal.  Mild bi atrial enlargement, no significant aortic valve stenosis or regurgitation.  Trivial mitral regurgitation, mild tricuspid regurgitation and a trivial pulmonic regurgitation.  No evidence of MI, scarring or infiltration.  #History of major depression listed in outside problem list.  previous diagnosis and treatment details are not available to me in EMR  I have referred her to psychiatrist  #  vaginal bleeding which stopped- she was seen by GYN and recommended for biopsy.   # She has poor IV access  and was not able to receive IV Emend and IV Aloxi. She does not want to have med port.    # 10/08/2020, cervical spine showed cervical spondylosis.  Spinal canal diagnosis and a posterior disc osteophyte complex and  superimposed small central disc protrusion contact the ventral spinal cord at C4-C5.  Multilevel neuroforaminal narrowing. bilateral atlantooccipital joint effusions, multilevel grade 1 spondylolisthesis. MRI lumbar spine without contrast showed acute or subacute fracture of L4.   Moderate spinal stenosis at L3-4 has progressed. Shallow left foraminal disc protrusion has progressed. Moderate subarticular stenosis on the left also with progression  Moderate spinal stenosis L4-5 with progression. Moderate subarticular stenosis on the left with progression  # 10/25/2020 seen by Lee who recommend patient to switch Dara CyBorD to Dara RD  # 11/03/2020 Revlimid (lenalidomide) 10 mg by mouth daily for 14 days, then hold for 7 days. Pt was started on a shortened cycle in order to remain on similar schedule with infusion.   # GERD   Dr. Ola Spurr has increased her omeprazole to 40 mg daily. She feels symptoms are improved.   # 11/22/2020 seen by hepatologist Dr.Kappus Rodman Key. Recommended to start Zoloft $RemoveBefo'50mg'QXopAgQhFlT$  daily for pruritis  INTERVAL HISTORY YANET Rodriguez is a 75 y.o. female who has above history reviewed by me today presents for follow up visit for management of AL amyloidosis Problems and complaints are listed below:  Patient reports feeling well today.  Constipation has improved after taking Colace.  She is not currently taking Lasix as a lower extremity swelling has been better.  She does not take Lasix any more as the lower extremity swelling is better.   Review of Systems  Constitutional:  Positive for fatigue. Negative for appetite change, chills, fever and unexpected weight change.  HENT:   Negative for hearing loss, nosebleeds and voice change.   Eyes:  Positive for icterus. Negative for eye problems.  Respiratory:  Negative for chest tightness and cough.   Cardiovascular:  Positive for leg swelling. Negative for chest pain.  Gastrointestinal:  Negative for abdominal  distention, abdominal pain, blood in stool and nausea.  Endocrine: Negative for hot flashes.  Genitourinary:  Negative for difficulty urinating and frequency.   Musculoskeletal:  Positive for back pain and neck pain. Negative for arthralgias.  Skin:  Negative for rash.       jaundice  Neurological:  Positive for numbness. Negative for extremity weakness.  Hematological:  Negative for adenopathy.  Psychiatric/Behavioral:  Negative for confusion.    MEDICAL HISTORY:  Past Medical History:  Diagnosis Date   Anemia    Anemia in chronic kidney disease   Chronic kidney disease    Stage 3b chronic kidney disease   Diabetes mellitus without complication (HCC)    Hypercholesterolemia    Hypertension    MGUS (monoclonal gammopathy of unknown significance)    Osteoarthritis    Rheumatoid arthritis ([place])     SURGICAL HISTORY: Past Surgical History:  Procedure Laterality Date   HERNIA REPAIR     PARATHYROIDECTOMY      SOCIAL HISTORY: Social History   Socioeconomic History   Marital status: Widowed    Spouse name: Not on file   Number of children: 6   Years of education: Not on file   Highest education level: Not on file  Occupational History   Not on file  Tobacco Use   Smoking status: Never   Smokeless tobacco: Never  Vaping Use   Vaping Use: Never used  Substance and Sexual Activity   Alcohol use: No   Drug use: Never   Sexual activity: Not on file  Other Topics Concern   Not on file  Social History Narrative   Not on file   Social Determinants of Health   Financial Resource Strain: Not on file  Food Insecurity: Not on file  Transportation Needs: Not on file  Physical Activity: Not on file  Stress: Not on file  Social Connections: Not on file  Intimate Partner Violence: Not on file    FAMILY HISTORY: Family History  Problem Relation Age of Onset   Diabetes Daughter    Diabetes Son    Dementia Mother    Cancer Father    Esophageal cancer Sister    Brain  cancer Brother     ALLERGIES:  is allergic to benazepril, lisinopril, tolmetin, and nsaids.  MEDICATIONS:  Current Outpatient Medications  Medication Sig Dispense Refill   acyclovir (ZOVIRAX) 400 MG tablet TAKE 1 TABLET BY MOUTH TWICE A DAY 60 tablet 5   albuterol (VENTOLIN HFA) 108 (90 Base) MCG/ACT inhaler Inhale 2 puffs into the lungs every 4 (four) hours as needed.     amLODipine (NORVASC) 5 MG tablet Take 5 mg by mouth daily.     aspirin EC 81 MG tablet Take 1 tablet (81 mg total) by mouth daily. Swallow whole. 30 tablet 0   Continuous Blood Gluc Sensor (FREESTYLE LIBRE 14 DAY SENSOR) MISC SMARTSIG:1 Kit(s) Topical Every 2 Weeks     dexamethasone (DECADRON) 4 MG tablet Take 5 tablets (20 mg total) by mouth once a week. 60 tablet 0   docusate sodium (COLACE) 100 MG capsule Take 1 capsule by mouth daily.     EPINEPHrine 0.3 mg/0.3 mL IJ SOAJ injection Inject 0.3 mg into the muscle as needed.     ferrous sulfate 325 (65 FE) MG tablet Take 1 tablet (325 mg total) by mouth 2 (two) times daily with a meal. 60 tablet 2   gabapentin (NEURONTIN) 300 MG capsule Take 300 mg by mouth at bedtime.     insulin glargine (LANTUS SOLOSTAR) 100 UNIT/ML Solostar Pen Inject 17 Units into the skin 2 (two) times daily. Pt takes in morning and bedtime.     KLOR-CON M20 20 MEQ tablet TAKE 2 TABLETS BY MOUTH 2 TIMES DAILY. 120 tablet 0   lenalidomide (REVLIMID) 10 MG capsule Take 1 capsule (10 mg total) by mouth daily. Take for 21 days, then hold for 7 days. Repeat every 28 days. 12 capsule 0   megestrol (MEGACE) 40 MG/ML suspension TAKE 10 MLS (400 MG TOTAL) BY MOUTH DAILY. 240 mL 0   metoprolol succinate (TOPROL-XL) 50 MG 24 hr tablet TAKE 1 TABLET (50 MG TOTAL) BY MOUTH DAILY IN THE MORNING     mirtazapine (REMERON) 7.5 MG tablet Take 1 tablet (7.5 mg total) by mouth at bedtime. 30 tablet 2   omeprazole (PRILOSEC) 20 MG capsule Take 1 capsule (20 mg total) by mouth daily. 30 capsule 1   traMADol (ULTRAM) 50  MG tablet Take 1 tablet (50 mg total) by mouth every 6 (six) hours as needed for severe pain. 8 tablet 0   furosemide (LASIX) 20 MG tablet TAKE 2 TABLETS BY MOUTH DAILY. TAKE 1 TABLET DAILY FOR 3 DAYS OR UNTIL SWELLING IMPROVES (Patient not taking: No sig reported) 60 tablet 3   hydrOXYzine (ATARAX/VISTARIL) 10 MG tablet Take 10 mg by mouth 2 (two) times daily as needed. (Patient not taking: Reported on  12/08/2020)     LORazepam (ATIVAN) 0.5 MG tablet Take 1 tablet (0.5 mg total) by mouth every 6 (six) hours as needed (nausea vomiting). (Patient not taking: No sig reported) 30 tablet 0   ondansetron (ZOFRAN) 8 MG tablet TAKE 8 MG BY MOUTH 30 TO 60 MIN PRIOR TO CYTOXAN ADMINISTRATION THEN TAKE 8 MG TWICE DAILY AS NEEDED FOR NAUSEA AND VOMITING. (Patient not taking: No sig reported) 30 tablet 1   ondansetron (ZOFRAN) 8 MG tablet Take by mouth. (Patient not taking: No sig reported)     promethazine (PHENERGAN) 25 MG tablet TAKE 1 TABLET BY MOUTH EVERY 8 HOURS AS NEEDED FOR NAUSEA AND VOMITING (Patient not taking: No sig reported) 90 tablet 1   sertraline (ZOLOFT) 25 MG tablet Take by mouth. (Patient not taking: No sig reported)     No current facility-administered medications for this visit.     PHYSICAL EXAMINATION: ECOG PERFORMANCE STATUS: 1 - Symptomatic but completely ambulatory Vitals:   12/08/20 0907  BP: (!) 135/55  Pulse: 72  Resp: 14  Temp: (!) 97.4 F (36.3 C)  SpO2: 100%   Filed Weights   12/08/20 0907  Weight: 129 lb 3 oz (58.6 kg)    Physical Exam Constitutional:      General: She is not in acute distress. HENT:     Head: Normocephalic and atraumatic.  Eyes:     General: No scleral icterus. Cardiovascular:     Rate and Rhythm: Normal rate and regular rhythm.     Heart sounds: Murmur heard.  Pulmonary:     Effort: Pulmonary effort is normal. No respiratory distress.     Breath sounds: No wheezing.  Abdominal:     General: Bowel sounds are normal. There is no  distension.     Palpations: Abdomen is soft.  Musculoskeletal:        General: No deformity. Normal range of motion.     Cervical back: Normal range of motion and neck supple.     Comments: Bilateral lower extremities trace edema  Skin:    General: Skin is warm and dry.     Findings: No erythema or rash.  Neurological:     Mental Status: She is alert and oriented to person, place, and time. Mental status is at baseline.     Cranial Nerves: No cranial nerve deficit.     Coordination: Coordination normal.  Psychiatric:        Mood and Affect: Mood normal.    LABORATORY DATA:  I have reviewed the data as listed Lab Results  Component Value Date   WBC 5.7 12/08/2020   HGB 7.7 (L) 12/08/2020   HCT 21.6 (L) 12/08/2020   MCV 74.7 (L) 12/08/2020   PLT 340 12/08/2020   Recent Labs    11/10/20 0915 11/24/20 0815 12/08/20 0843  NA 133* 133* 132*  K 3.4* 3.4* 3.2*  CL 101 104 101  CO2 $Re'25 22 25  'Ckk$ GLUCOSE 275* 247* 226*  BUN $Re'14 16 9  'URK$ CREATININE 0.89 0.89 0.78  CALCIUM 8.4* 8.9 8.2*  GFRNONAA >60 >60 >60  PROT 5.3* 6.1* 5.2*  ALBUMIN 2.1* 2.3* 1.9*  AST 46* 41 37  ALT 34 42 32  ALKPHOS 889* 985* 805*  BILITOT 3.6* 3.6* 3.2*    Iron/TIBC/Ferritin/ %Sat    Component Value Date/Time   IRON 35 08/31/2020 1037   TIBC 290 08/31/2020 1037   FERRITIN 82 08/31/2020 1037   IRONPCTSAT 12 08/31/2020 1037     08/25/2019, platelet  count 491, WBC 7.5, hemoglobin 12 Creatinine 1.58, EGFR 37, calcium 10.8, albumin 4.2 Negative hepatitis B surface antigen, hepatitis B core antibody, Negative hepatitis C 08/05/2019, A1c 11.2   RADIOGRAPHIC STUDIES: I have personally reviewed the radiological images as listed and agreed with the findings in the report. DG Lumbar Spine Complete  Result Date: 09/23/2020 CLINICAL DATA:  Acute low back pain. EXAM: LUMBAR SPINE - COMPLETE 4+ VIEW COMPARISON:  None. FINDINGS: Mildly displaced fracture is seen involving the L4 vertebral body. Mild grade 1  anterolisthesis of L4-5 is noted secondary to posterior facet joint hypertrophy. Mild degenerative disc disease is noted at L1-2, L2-3 and L5-S1 with anterior osteophyte formation. IMPRESSION: Mildly displaced acute fracture is seen involving the L4 vertebral body. These results will be called to the ordering clinician or representative by the Radiologist Assistant, and communication documented in the PACS or zVision Dashboard. Aortic Atherosclerosis (ICD10-I70.0). Electronically Signed   By: Marijo Conception M.D.   On: 09/23/2020 14:44   MR CERVICAL SPINE WO CONTRAST  Result Date: 10/10/2020 CLINICAL DATA:  Cervical myelopathy. Additional history provided by scanning technologist: Patient reports fall June 3rd resulting in shoulder dislocation, numbness of left 4/5 fingers, difficulty with movement. EXAM: MRI CERVICAL SPINE WITHOUT CONTRAST TECHNIQUE: Multiplanar, multisequence MR imaging of the cervical spine was performed. No intravenous contrast was administered. COMPARISON:  No pertinent prior exams available for comparison. FINDINGS: Alignment: Reversal of the expected cervical lordosis. Trace C2-C3 grade 1 anterolisthesis. 2 mm C3-C4 grade 1 anterolisthesis. Trace C5-C6 grade 1 retrolisthesis. Trace C7-T1 grade 1 anterolisthesis. Vertebrae: Vertebral body height is maintained. No significant marrow edema or focal suspicious osseous lesion. Multilevel ventral osteophytes. Cord: No spinal cord signal abnormality is identified. Posterior Fossa, vertebral arteries, paraspinal tissues: Incidentally noted partially empty sella turcica. No abnormality identified within included portions of the posterior fossa. Flow voids preserved within the imaged cervical vertebral arteries. Paraspinal soft tissues within normal limits. Small bilateral atlantooccipital joint effusions. Disc levels: Moderate disc degeneration at C4-C5, C5-C6 and C6-C7. Mild disc degeneration at the remaining levels. C2-C3: Trace grade 1  anterolisthesis. Disc uncovering with tiny right center disc protrusion. Minimal facet arthrosis. No significant spinal canal or foraminal stenosis. C3-C4: Grade 1 anterolisthesis. Disc uncovering with small central disc protrusion. Uncovertebral hypertrophy. The disc protrusion mildly effaces the ventral thecal sac without significant spinal cord mass effect. Bilateral neural foraminal narrowing (mild right, moderate left). C4-C5: Posterior disc osteophyte complex with bilateral disc osteophyte ridge/uncinate hypertrophy. Superimposed small central disc protrusion. Mild spinal canal narrowing with contact upon the ventral spinal cord. Moderate/severe bilateral neural foraminal narrowing (greater on the right). C5-C6: Trace grade 1 retrolisthesis. Disc bulge. Superimposed small central disc extrusion with cranial migration to the mid L5 vertebral body level (series 9, image 8). Right-sided disc osteophyte ridge/uncinate hypertrophy. Mild relative spinal canal narrowing. Severe right neural foraminal narrowing. C6-C7: Disc bulge with bilateral disc osteophyte ridge/uncinate hypertrophy. Mild spinal canal narrowing. Bilateral neural foraminal narrowing (moderate/severe right, moderate left). C7-T1: Trace grade 1 anterolisthesis. Slight disc uncovering. Superimposed small central disc protrusion. Minimal facet arthrosis minimal partial effacement of the ventral thecal sac without spinal cord mass effect. No significant foraminal narrowing. T1-T2: Imaged sagittally. Small central disc protrusion. Mild effacement of the ventral thecal sac without appreciable spinal cord mass effect. No significant foraminal stenosis. T2-T3: Trace grade 1 anterolisthesis. No appreciable significant disc herniation or spinal canal stenosis. Facet arthrosis/ligamentum flavum hypertrophy. Mild bilateral neural foraminal narrowing (greater on the right). IMPRESSION: Cervical spondylosis, as  outlined. No more than mild spinal canal stenosis.  Most notably, a posterior disc osteophyte complex and superimposed small central disc protrusion contact the ventral spinal cord at C4-C5. Multilevel neural foraminal narrowing, as detailed and greatest on the left at C3-C4 (moderate), bilaterally at C4-C5 (moderate/severe) and bilaterally at C6-C7 (moderate/severe right, moderate left). Reversal of the expected cervical lordosis. Mild multilevel grade 1 spondylolisthesis, as above. Small bilateral atlantooccipital joint effusions. Electronically Signed   By: Kellie Simmering DO   On: 10/10/2020 10:42   MR LUMBAR SPINE WO CONTRAST  Result Date: 10/10/2020 CLINICAL DATA:  Closed compression fracture L4 vertebral body. EXAM: MRI LUMBAR SPINE WITHOUT CONTRAST TECHNIQUE: Multiplanar, multisequence MR imaging of the lumbar spine was performed. No intravenous contrast was administered. COMPARISON:  Lumbar radiographs 09/21/2020.  Lumbar MRI 05/11/2020 FINDINGS: Segmentation:  Normal Alignment: Mild retrolisthesis L1-2, L2-3, L3-4. Mild anterolisthesis L4-5 Vertebrae: Moderate compression fracture of L4. No underlying lesion is suspected. There is mild diffuse bone marrow edema. No significant retropulsion of bone into the canal. Fracture appears to involve the superior and inferior endplates. 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 No additional fracture Conus medullaris and cauda equina: Conus extends to the L2 level. Conus and cauda equina appear normal. Paraspinal and other soft tissues: Negative for paraspinous mass or adenopathy or fluid collection. Mild paraspinous edema around the L4 fracture. Disc levels: T12-L1: Small central disc protrusion unchanged from the prior MRI. No significant stenosis L1-2: Mild retrolisthesis.  Mild disc degeneration without stenosis L2-3: Mild retrolisthesis. Disc bulging and mild facet degeneration. Negative for stenosis L3-4: Disc bulging and diffuse endplate spurring. Shallow left foraminal disc protrusion has  progressed. Bilateral facet hypertrophy. Progression of moderate spinal stenosis and moderate subarticular stenosis on the left. L4-5: Mild anterolisthesis. Central disc protrusion unchanged. Mild left foraminal disc protrusion unchanged. Advanced facet degeneration. Moderate spinal stenosis with progression. Moderate subarticular stenosis on the left with progression. L5-S1: Disc degeneration with diffuse endplate spurring. Mild foraminal stenosis bilaterally unchanged. IMPRESSION: 1. Moderately severe acute or subacute fracture of L4 without significant retropulsion of bone into the canal. Large hemangioma L1. No other fracture 2. Moderate spinal stenosis at L3-4 has progressed. Shallow left foraminal disc protrusion has progressed. Moderate subarticular stenosis on the left also with progression 3. Moderate spinal stenosis L4-5 with progression. Moderate subarticular stenosis on the left with progression. Electronically Signed   By: Franchot Gallo M.D.   On: 10/10/2020 10:41   DG Shoulder Left  Result Date: 09/15/2020 CLINICAL DATA:  75 year old female with anterior left shoulder dislocation after fall. Post reduction. EXAM: LEFT SHOULDER - 2+ VIEW COMPARISON:  0907 hours today. FINDINGS: Normal glenohumeral joint alignment now. Proximal left humerus and scapula appear intact. Mild degenerative changes at the left [facility] joint. Left clavicle and visible left ribs appear intact. IMPRESSION: Reduction of the left shoulder dislocation with no fracture identified. Electronically Signed   By: Genevie Ann M.D.   On: 09/15/2020 11:11   DG Shoulder Left  Result Date: 09/15/2020 CLINICAL DATA:  75 year old female status post fall and pain. EXAM: LEFT SHOULDER - 2+ VIEW COMPARISON:  Left shoulder series 11/30/2014. FINDINGS: Anterior glenohumeral dislocation. No acute fracture identified. Left clavicle and scapula appear to remain normally aligned. Visible left ribs appear intact. IMPRESSION: Anterior left glenohumeral  dislocation.  No fracture identified. Electronically Signed   By: Genevie Ann M.D.   On: 09/15/2020 09:25   MM 3D SCREEN BREAST BILATERAL  Result Date: 12/02/2020 CLINICAL DATA:  Screening. EXAM: DIGITAL  SCREENING BILATERAL MAMMOGRAM WITH TOMOSYNTHESIS AND CAD TECHNIQUE: Bilateral screening digital craniocaudal and mediolateral oblique mammograms were obtained. Bilateral screening digital breast tomosynthesis was performed. The images were evaluated with computer-aided detection. COMPARISON:  Previous exam(s). ACR Breast Density Category c: The breast tissue is heterogeneously dense, which may obscure small masses. FINDINGS: There are no findings suspicious for malignancy. IMPRESSION: No mammographic evidence of malignancy. A result letter of this screening mammogram will be mailed directly to the patient. RECOMMENDATION: Screening mammogram in one year. (Code:[id number]-B-01Y) BI-RADS CATEGORY  1: Negative. Electronically Signed   By: Lillia Mountain M.D.   On: 12/02/2020 12:22  US Abdomen Limited RUQ (LIVER/GB)  Result Date: 10/31/2020 CLINICAL DATA:  Hyperbilirubinemia. EXAM: ULTRASOUND ABDOMEN LIMITED RIGHT UPPER QUADRANT COMPARISON:  CT of the abdomen and pelvis without contrast 09/02/2020. Right upper quadrant ultrasound 08/25/2020. FINDINGS: Gallbladder: Large gallstones scratched at large shadowing gallstones noted. These measure up to 9 mm. Gallbladder is contracted. Wall thickness measures 5 mm, likely due to contraction. No sonographic Percell Miller sign is present. Common bile duct: Diameter: 4 mm Liver: Simple cyst the left lobe measures up to 1.4 cm. Hepatic parenchyma within normal limits. Portal vein is patent on color Doppler imaging with normal direction of blood flow towards the liver. Other: Small amount of ascites is noted. IMPRESSION: 1. Cholelithiasis without evidence for acute cholecystitis. 2. Small amount of ascites. 3. Normal sonographic appearance of the liver.  Stable cyst. Electronically Signed   By:  San Morelle M.D.   On: 10/31/2020 09:46       ASSESSMENT & PLAN:  1. Light chain (AL) amyloidosis (HCC)   2. Encounter for antineoplastic chemotherapy   3. 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[place])   4. Hyperbilirubinemia   5. Vaginal bleeding   6. Gastroesophageal reflux disease, unspecified whether esophagitis present    # AL-Amyloidosis Liver involvement, possible kidney involvement- Labs are reviewed and discussed with patient. On cycle 2 Revlimid 10 mg, finished 14 days. Labs are reviewed and discussed with patient. Proceed with daratumumab today Continue aspirin 81 mg daily.-otc  #nemia is multifactorial, from CKD, marrow suppression and iron deficiency. patient's hemoglobin dropped to 7.7, likely due to chemotherapy. Discussed with patient, her daughter,  and also her son [name] over the phone that after today's treatments, her blood count may further decrease.  She may need blood product support next week.  Patient is reluctant about blood transfusion.  Then I recommend patient to hold off day 15-21 Revlimid for this cycle.   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 Marland Kitchen    #Elevated LFT, due to amyloidosis liver involvement.  Ultrasound showed small amount of ascites, stable liver cyst. Bilirubin is improved since dc of Velcade.  Continues to improve to 3.2 today.  #GERD, continue omeprazole 40 mg daily. 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   #Chemotherapy-induced nausea, stable symptoms.   #Lack of IV access, discussed about option of Mediport placement.  She declined. #Hypokalemia due to lasix, potassium level 3.2. continue  potassium to 58meq twice daily  . 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  Stable creatinine. Repeat 24-hour urine protein, ordered previously and she did not  do,  Follow up with nephrology  #Anxiety, recommend patient to take Remeron-unclear if patient is following the recommendation. # Pruritis, on Zoloft per hepatologist.  #Unintentional weight loss, symptoms improved after started on Megace.  Continue  Megace as needed for appetite stimulant. #Bilateral lower extremity swelling, neg DVT.  Improved, off Lasix.  # vaginal bleeding, seen by Gyn, s/p biopsy. Dr.Kang asks gyn to add congored staining  We spent sufficient time to discuss many aspect of care, questions were answered to patient's satisfaction.  Lab MD 2 weeks, [place] cc Leonel Ramsay, MD   [name], MD, PhD Hematology Oncology [facility] Pager- [phone] 12/08/2020

## 2020-12-08 NOTE — Patient Instructions (Signed)
[facility]  Discharge Instructions: Thank you for choosing [facility] to provide your oncology and hematology care.  If you have a lab appointment with the Cancer Center, please go directly to the Cancer Center and check in at the registration area.  Wear comfortable clothing and clothing appropriate for easy access to any Portacath or PICC line.   We strive to give you quality time with your provider. You may need to reschedule your appointment if you arrive late (15 or more minutes).  Arriving late affects you and other patients whose appointments are after yours.  Also, if you miss three or more appointments without notifying the office, you may be dismissed from the clinic at the provider's discretion.      For prescription refill requests, have your pharmacy contact our office and allow 72 hours for refills to be completed.    Today you received the following chemotherapy and/or immunotherapy agents       To help prevent nausea and vomiting after your treatment, we encourage you to take your nausea medication as directed.  BELOW ARE SYMPTOMS THAT SHOULD BE REPORTED IMMEDIATELY: *FEVER GREATER THAN 100.4 F (38 C) OR HIGHER *CHILLS OR SWEATING *NAUSEA AND VOMITING THAT IS NOT CONTROLLED WITH YOUR NAUSEA MEDICATION *UNUSUAL SHORTNESS OF BREATH *UNUSUAL BRUISING OR BLEEDING *URINARY PROBLEMS (pain or burning when urinating, or frequent urination) *BOWEL PROBLEMS (unusual diarrhea, constipation, pain near the anus) TENDERNESS IN MOUTH AND THROAT WITH OR WITHOUT PRESENCE OF ULCERS (sore throat, sores in mouth, or a toothache) UNUSUAL RASH, SWELLING OR PAIN  UNUSUAL VAGINAL DISCHARGE OR ITCHING   Items with * indicate a potential emergency and should be followed up as soon as possible or go to the Emergency Department if any problems should occur.  Please show the CHEMOTHERAPY ALERT CARD or IMMUNOTHERAPY ALERT CARD at check-in to the Emergency  Department and triage nurse.  Should you have questions after your visit or need to cancel or reschedule your appointment, please contact [facility]  [phone] and follow the prompts.  Office hours are 8:00 a.m. to 4:30 p.m. Monday - Friday. Please note that voicemails left after 4:00 p.m. may not be returned until the following business day.  We are closed weekends and major holidays. You have access to a nurse at all times for urgent questions. Please call the main number to the clinic [phone] and follow the prompts.  For any non-urgent questions, you may also contact your provider using MyChart. We now offer e-Visits for anyone 18 and older to request care online for non-urgent symptoms. For details visit [email or web address].   Also download the MyChart app! Go to the app store, search "MyChart", open the app, select [facility], and log in with your MyChart username and password.  Due to Covid, a mask is required upon entering the hospital/clinic. If you do not have a mask, one will be given to you upon arrival. For doctor visits, patients may have 1 support person aged 18 or older with them. For treatment visits, patients cannot have anyone with them due to current Covid guidelines and our immunocompromised population.  

## 2020-12-10 ENCOUNTER — Encounter: Payer: Self-pay | Admitting: Oncology

## 2020-12-15 ENCOUNTER — Other Ambulatory Visit: Payer: Self-pay | Admitting: *Deleted

## 2020-12-15 ENCOUNTER — Ambulatory Visit: Payer: Medicare Other | Admitting: Internal Medicine

## 2020-12-15 DIAGNOSIS — E8581 Light chain (AL) amyloidosis: Secondary | ICD-10-CM

## 2020-12-15 MED ORDER — LENALIDOMIDE 10 MG PO CAPS: 10.0000 mg | ORAL_CAPSULE | Freq: Every day | ORAL | 0 refills | Status: DC

## 2020-12-22 ENCOUNTER — Other Ambulatory Visit: Payer: Medicare Other

## 2020-12-22 ENCOUNTER — Ambulatory Visit: Payer: Medicare Other | Admitting: Oncology

## 2020-12-22 ENCOUNTER — Ambulatory Visit: Payer: Medicare Other

## 2020-12-23 ENCOUNTER — Encounter: Payer: Self-pay | Admitting: Oncology

## 2020-12-23 ENCOUNTER — Inpatient Hospital Stay: Payer: Medicare Other

## 2020-12-23 ENCOUNTER — Inpatient Hospital Stay: Payer: Medicare Other | Attending: Oncology

## 2020-12-23 ENCOUNTER — Other Ambulatory Visit: Payer: Self-pay

## 2020-12-23 ENCOUNTER — Inpatient Hospital Stay (HOSPITAL_BASED_OUTPATIENT_CLINIC_OR_DEPARTMENT_OTHER): Payer: Medicare Other | Admitting: Oncology

## 2020-12-23 VITALS — BP 156/73 | HR 88 | Temp 96.1°F | Resp 16 | Wt 129.4 lb

## 2020-12-23 DIAGNOSIS — R634 Abnormal weight loss: Secondary | ICD-10-CM

## 2020-12-23 DIAGNOSIS — D631 Anemia in chronic kidney disease: Secondary | ICD-10-CM

## 2020-12-23 DIAGNOSIS — K219 Gastro-esophageal reflux disease without esophagitis: Secondary | ICD-10-CM

## 2020-12-23 DIAGNOSIS — N939 Abnormal uterine and vaginal bleeding, unspecified: Secondary | ICD-10-CM | POA: Diagnosis not present

## 2020-12-23 DIAGNOSIS — Z5111 Encounter for antineoplastic chemotherapy: Secondary | ICD-10-CM | POA: Diagnosis not present

## 2020-12-23 DIAGNOSIS — R7989 Other specified abnormal findings of blood chemistry: Secondary | ICD-10-CM | POA: Diagnosis not present

## 2020-12-23 DIAGNOSIS — Z7982 Long term (current) use of aspirin: Secondary | ICD-10-CM | POA: Insufficient documentation

## 2020-12-23 DIAGNOSIS — D509 Iron deficiency anemia, unspecified: Secondary | ICD-10-CM | POA: Insufficient documentation

## 2020-12-23 DIAGNOSIS — I1310 Hypertensive heart and chronic kidney disease without heart failure, with stage 1 through stage 4 chronic kidney disease, or unspecified chronic kidney disease: Secondary | ICD-10-CM | POA: Insufficient documentation

## 2020-12-23 DIAGNOSIS — N1832 Chronic kidney disease, stage 3b: Secondary | ICD-10-CM | POA: Insufficient documentation

## 2020-12-23 DIAGNOSIS — N1831 Chronic kidney disease, stage 3a: Secondary | ICD-10-CM

## 2020-12-23 DIAGNOSIS — Z79899 Other long term (current) drug therapy: Secondary | ICD-10-CM | POA: Diagnosis not present

## 2020-12-23 DIAGNOSIS — E859 Amyloidosis, unspecified: Secondary | ICD-10-CM

## 2020-12-23 DIAGNOSIS — E8581 Light chain (AL) amyloidosis: Secondary | ICD-10-CM | POA: Diagnosis not present

## 2020-12-23 DIAGNOSIS — T451X5A Adverse effect of antineoplastic and immunosuppressive drugs, initial encounter: Secondary | ICD-10-CM

## 2020-12-23 DIAGNOSIS — R112 Nausea with vomiting, unspecified: Secondary | ICD-10-CM

## 2020-12-23 LAB — CBC WITH DIFFERENTIAL/PLATELET
Abs Immature Granulocytes: 0.07 10*3/uL (ref 0.00–0.07)
Basophils Absolute: 0.1 10*3/uL (ref 0.0–0.1)
Basophils Relative: 2 %
Eosinophils Absolute: 0.1 10*3/uL (ref 0.0–0.5)
Eosinophils Relative: 2 %
HCT: 23.6 % — ABNORMAL LOW (ref 36.0–46.0)
Hemoglobin: 8 g/dL — ABNORMAL LOW (ref 12.0–15.0)
Immature Granulocytes: 1 %
Lymphocytes Relative: 16 %
Lymphs Abs: 0.8 10*3/uL (ref 0.7–4.0)
MCH: 26.7 pg (ref 26.0–34.0)
MCHC: 33.9 g/dL (ref 30.0–36.0)
MCV: 78.7 fL — ABNORMAL LOW (ref 80.0–100.0)
Monocytes Absolute: 0.4 10*3/uL (ref 0.1–1.0)
Monocytes Relative: 9 %
Neutro Abs: 3.5 10*3/uL (ref 1.7–7.7)
Neutrophils Relative %: 70 %
Platelets: 479 10*3/uL — ABNORMAL HIGH (ref 150–400)
RBC: 3 MIL/uL — ABNORMAL LOW (ref 3.87–5.11)
RDW: 24 % — ABNORMAL HIGH (ref 11.5–15.5)
WBC: 5 10*3/uL (ref 4.0–10.5)
nRBC: 0.8 % — ABNORMAL HIGH (ref 0.0–0.2)

## 2020-12-23 LAB — COMPREHENSIVE METABOLIC PANEL
ALT: 32 U/L (ref 0–44)
AST: 47 U/L — ABNORMAL HIGH (ref 15–41)
Albumin: 2.2 g/dL — ABNORMAL LOW (ref 3.5–5.0)
Alkaline Phosphatase: 877 U/L — ABNORMAL HIGH (ref 38–126)
Anion gap: 7 (ref 5–15)
BUN: 12 mg/dL (ref 8–23)
CO2: 23 mmol/L (ref 22–32)
Calcium: 8.6 mg/dL — ABNORMAL LOW (ref 8.9–10.3)
Chloride: 104 mmol/L (ref 98–111)
Creatinine, Ser: 0.75 mg/dL (ref 0.44–1.00)
GFR, Estimated: >60 mL/min (ref >60)
Glucose, Bld: 201 mg/dL — ABNORMAL HIGH (ref 70–99)
Potassium: 3.3 mmol/L — ABNORMAL LOW (ref 3.5–5.1)
Sodium: 134 mmol/L — ABNORMAL LOW (ref 135–145)
Total Bilirubin: 3.6 mg/dL — ABNORMAL HIGH (ref 0.3–1.2)
Total Protein: 5.6 g/dL — ABNORMAL LOW (ref 6.5–8.1)

## 2020-12-23 LAB — SAMPLE TO BLOOD BANK

## 2020-12-23 MED ORDER — ACETAMINOPHEN 325 MG PO TABS
325.0000 mg | ORAL_TABLET | Freq: Once | ORAL | Status: AC
  Administered 2020-12-23: 325 mg via ORAL
  Filled 2020-12-23: qty 1

## 2020-12-23 MED ORDER — DIPHENHYDRAMINE HCL 25 MG PO CAPS
50.0000 mg | ORAL_CAPSULE | Freq: Once | ORAL | Status: AC
  Administered 2020-12-23: 50 mg via ORAL
  Filled 2020-12-23: qty 2

## 2020-12-23 MED ORDER — DARATUMUMAB-HYALURONIDASE-FIHJ 1800-30000 MG-UT/15ML ~~LOC~~ SOLN
1800.0000 mg | Freq: Once | SUBCUTANEOUS | Status: AC
  Administered 2020-12-23: 1800 mg via SUBCUTANEOUS
  Filled 2020-12-23: qty 15

## 2020-12-23 MED ORDER — DEXAMETHASONE 4 MG PO TABS
20.0000 mg | ORAL_TABLET | Freq: Once | ORAL | Status: AC
  Administered 2020-12-23: 20 mg via ORAL
  Filled 2020-12-23: qty 5

## 2020-12-23 NOTE — Progress Notes (Signed)
Nutrition Follow-up:  Patient with monoclonal gammopathy with AL amyloidosis.  Patient receiving DaraCyBorD.    Met with patient during infusion. Patient reports that her appetite has not doing very good but thinks it might be improving.  Says that she is taking megace.  Says that she is drinking 1/2 of ensure every other day and eating yogurt.  Does not say that ensure upsets stomach.  Has been trying to eat 3 times per day.  Made a vegetable soup recently that had chicken in it that she was eating breakfast, lunch and dinner.  Likes fruit.      Medications: reviewed  Labs: reviewed  Anthropometrics:   Weight 129 lb 6.4 oz on 9/9  142 lb on 5/25  9% weight loss in 3 and half months, significant  NUTRITION DIAGNOSIS: Unintentional weight loss related to poor appetite as evidenced by 9% weight loss in the last 3 and half months    INTERVENTION:  Continue appetite stimulant Encouraged patient to increase shake to 1-2 times per day.  Denies that shake upsets stomach.  Did not know that she could drink it that often.  Discussed that with her appetite being poor and significant weight loss would encourage it more frequently if able to drink for calories and protein.       MONITORING, EVALUATION, GOAL: weight trends, intake   NEXT VISIT: To be determined with treatments  Madhav Mohon B. Zenia Resides, [place], [place] Registered Dietitian [phone] (mobile)

## 2020-12-23 NOTE — Patient Instructions (Signed)
[facility]  Discharge Instructions: Thank you for choosing [facility] to provide your oncology and hematology care.  If you have a lab appointment with the [facility], please go directly to the [facility] and check in at the registration area.  Wear comfortable clothing and clothing appropriate for easy access to any Portacath or PICC line.   We strive to give you quality time with your provider. You may need to reschedule your appointment if you arrive late (15 or more minutes).  Arriving late affects you and other patients whose appointments are after yours.  Also, if you miss three or more appointments without notifying the office, you may be dismissed from the clinic at the provider's discretion.      For prescription refill requests, have your pharmacy contact our office and allow 72 hours for refills to be completed.    Today you received the following chemotherapy and/or immunotherapy agents: Darzalex   To help prevent nausea and vomiting after your treatment, we encourage you to take your nausea medication as directed.  BELOW ARE SYMPTOMS THAT SHOULD BE REPORTED IMMEDIATELY: *FEVER GREATER THAN 100.4 F (38 C) OR HIGHER *CHILLS OR SWEATING *NAUSEA AND VOMITING THAT IS NOT CONTROLLED WITH YOUR NAUSEA MEDICATION *UNUSUAL SHORTNESS OF BREATH *UNUSUAL BRUISING OR BLEEDING *URINARY PROBLEMS (pain or burning when urinating, or frequent urination) *BOWEL PROBLEMS (unusual diarrhea, constipation, pain near the anus) TENDERNESS IN MOUTH AND THROAT WITH OR WITHOUT PRESENCE OF ULCERS (sore throat, sores in mouth, or a toothache) UNUSUAL RASH, SWELLING OR PAIN  UNUSUAL VAGINAL DISCHARGE OR ITCHING   Items with * indicate a potential emergency and should be followed up as soon as possible or go to the Emergency Department if any problems should occur.  Please show the CHEMOTHERAPY ALERT CARD or IMMUNOTHERAPY ALERT CARD at check-in to  the Emergency Department and triage nurse.  Should you have questions after your visit or need to cancel or reschedule your appointment, please contact [facility]  [phone] and follow the prompts.  Office hours are 8:00 a.m. to 4:30 p.m. Monday - Friday. Please note that voicemails left after 4:00 p.m. may not be returned until the following business day.  We are closed weekends and major holidays. You have access to a nurse at all times for urgent questions. Please call the main number to the clinic [phone] and follow the prompts.  For any non-urgent questions, you may also contact your provider using MyChart. We now offer e-Visits for anyone 71 and older to request care online for non-urgent symptoms. For details visit [email or web address].   Also download the MyChart app! Go to the app store, search "MyChart", open the app, select [facility], and log in with your MyChart username and password.  Due to Covid, a mask is required upon entering the hospital/clinic. If you do not have a mask, one will be given to you upon arrival. For doctor visits, patients may have 1 support person aged 29 or older with them. For treatment visits, patients cannot have anyone with them due to current Covid guidelines and our immunocompromised population.

## 2020-12-23 NOTE — Progress Notes (Signed)
Hematology/Oncology  Follow up note [facility] [phone] [phone]   Patient Care Team: Leonel Ramsay, MD as PCP - General (Infectious Diseases) End, [name], MD as PCP - Cardiology (Cardiology)  REFERRING PROVIDER: Leonel Ramsay, MD  CHIEF COMPLAINTS/REASON FOR VISIT:  Follow-up for amyloidosis  HISTORY OF PRESENTING ILLNESS:   Nancy Rodriguez is a  75 y.o.  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  Work up include protein electrophoresis showed M protein 0.7,   Reviewed her previous medical records via care everywhere.  She was seen by Hematology Oncology at [facility] on 02/14/2017.  07/25/2007  SPEP showed M protein of 0.35, IFE showed IgG lamda 09/22/2007  Bone survey negative.  02/15/14 IgG 930, SPEP M protien 0.22, free kappa light chain 3.59, lamda 2.92, ratio 1.23  Hypercalcemia, resolved after stopping HCTZ.  # Transaminitis 03/28/20 US abdomen showed left liver lobe Complex 1.7 cm cyst  # 04/07/20 MRI abdomen w/wo contrast showed 2 benign liver cysts, 2 nonspecific hypovascular irighr liver lesions, abnormal bone marrow signal of L1 vertebral body. Patient was  advised to proceed with bone marrow biopsy and she declined.  # referred her to established care with GI and was seen on 04/19/20,  # Liver biopsy showed amyloidosis. [name] MS/MS showed AL type # 05/20/20 recommend bone marrow biopsy which is scheduled on 05/24/2020.  Patient changed her mind and ask a biopsy to be canceled.  My team and I had multiple phone discussion with patient's daughter [name] and later with [name].  Patient agreed with bone marrow biopsy and a biopsy was scheduled on 06/09/2020.  05/20/20 NT proBNP  was normal,  TSH slightly increased, normal  T4 Normal factor X Normal coags Troponin 19. Refer to cardiology for evaluation. May need cardiac MRI  # 06/01/2020 2D echo result was reviewed and discussed with patient.  Patient has normal LVEF 60-65%. Mild asymmetric left ventricular hypertrophy.  Left ventricular diastolic parameters are indeterminate. average left ventricular global longitudinal strain is -16.8 %.  # 06/09/2020, bone marrow biopsy showed monoclonal plasmacytosis, 8%, amyloid deposit present.  Absent iron stores. 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 t (14;20) #06/17/2020, further discussed about diagnosis and treatment plan.  Patient declined bone marrow transplant evaluation.  Decision was made to proceed with Dara-CyborD chemotherapy treatments. #May 2022, second opinion at [place] with Dr. Tracey Harries.who agrees with current treatment plan.  He recommends to lower dexamethasone to 20 mg and decrease premed Tylenol to 325 mg  09/01/2020 cardiac MRI was performed at [facility].  Left ventricle is normal in size.  Mild to moderate basal septal hypertrophy.  Hyper dynamic LV function.  LVEF 71%.  Right ventricle is normal in size and wall thickness.  Systolic function normal.  Mild bi atrial enlargement, no significant aortic valve stenosis or regurgitation.  Trivial mitral regurgitation, mild tricuspid regurgitation and a trivial pulmonic regurgitation.  No evidence of MI, scarring or infiltration.  #History of major depression listed in outside problem list.  previous diagnosis and treatment details are not available to me in EMR  I have referred her to psychiatrist  #  vaginal bleeding which stopped- she was seen by GYN and recommended for biopsy.   # She has poor IV access  and was not able to receive IV Emend and IV Aloxi. She does not want to have med port.   # GERD   Dr. Ola Spurr has increased her omeprazole to 40 mg daily. She feels symptoms are improved.  # 10/08/2020, cervical  spine showed cervical spondylosis.  Spinal canal diagnosis and a posterior disc osteophyte complex and superimposed small central disc protrusion contact the ventral spinal cord at C4-C5.  Multilevel neuroforaminal narrowing. bilateral atlantooccipital joint effusions, multilevel grade 1 spondylolisthesis. MRI lumbar spine without contrast showed acute or subacute fracture of L4.   Moderate spinal stenosis at L3-4 has progressed. Shallow left foraminal disc protrusion has progressed. Moderate subarticular stenosis on the left also with progression  Moderate spinal stenosis L4-5 with progression. Moderate subarticular stenosis on the left with progression  # 10/25/2020 seen by [place] who recommend patient to switch Dara CyBorD to Dara RD  # 11/03/2020 Revlimid (lenalidomide) 10 mg by mouth daily for 14 days, then hold for 7 days. Pt was started on a shortened cycle in order to remain on similar schedule with infusion.   # 11/22/2020 seen by hepatologist Dr.Kappus Rodman Key. Recommended to start Zoloft 2m daily for pruritis # 11/24/2020 cycle 2 Revlimid 10 mg, she finished 14 days.  Rest of the course was held due to anemia.  Patient declined blood transfusion.    INTERVAL HISTORY SNICHOLETTE DOLSONis a 75y.o. female who has above history reviewed by me today presents for follow up visit for management of AL amyloidosis Problems and complaints are listed below: Patient has vaginal spotting, getting worse.  Patient was seen by Dr. BLeafy Royesterday.  She had a Pap smear done.  Repeat endometrial biopsy.  Definitive management to be decided on pathology findings. #1.  Intermediate, months will need annual mammogram injections unremarkable Otherwise no new complaints.  Review of Systems  Constitutional:  Positive for fatigue. Negative for appetite change, chills, fever and unexpected weight change.  HENT:   Negative for hearing loss, nosebleeds and voice change.   Eyes:  Positive for  icterus. Negative for eye problems.  Respiratory:  Negative for chest tightness and cough.   Cardiovascular:  Negative for chest pain and leg swelling.  Gastrointestinal:  Negative for abdominal distention, abdominal pain, blood in stool and nausea.  Endocrine: Negative for hot flashes.  Genitourinary:  Negative for difficulty urinating and frequency.   Musculoskeletal:  Positive for back pain. Negative for arthralgias.  Skin:  Negative for rash.       jaundice  Neurological:  Positive for numbness. Negative for extremity weakness.  Hematological:  Negative for adenopathy.  Psychiatric/Behavioral:  Negative for confusion.    MEDICAL HISTORY:  Past Medical History:  Diagnosis Date   Anemia    Anemia in chronic kidney disease   Chronic kidney disease    Stage 3b chronic kidney disease   Diabetes mellitus without complication (HCC)    Hypercholesterolemia    Hypertension    MGUS (monoclonal gammopathy of unknown significance)    Osteoarthritis    Rheumatoid arthritis ([place]     SURGICAL HISTORY: Past Surgical History:  Procedure Laterality Date   HERNIA REPAIR     PARATHYROIDECTOMY      SOCIAL HISTORY: Social History   Socioeconomic History   Marital status: Widowed    Spouse name: Not on file   Number of children: 6   Years of education: Not on file   Highest education level: Not on file  Occupational History  Not on file  Tobacco Use   Smoking status: Never   Smokeless tobacco: Never  Vaping Use   Vaping Use: Never used  Substance and Sexual Activity   Alcohol use: No   Drug use: Never   Sexual activity: Not on file  Other Topics Concern   Not on file  Social History Narrative   Not on file   Social Determinants of Health   Financial Resource Strain: Not on file  Food Insecurity: Not on file  Transportation Needs: Not on file  Physical Activity: Not on file  Stress: Not on file  Social Connections: Not on file  Intimate Partner Violence: Not on file     FAMILY HISTORY: Family History  Problem Relation Age of Onset   Diabetes Daughter    Diabetes Son    Dementia Mother    Cancer Father    Esophageal cancer Sister    Brain cancer Brother     ALLERGIES:  is allergic to benazepril, lisinopril, tolmetin, and nsaids.  MEDICATIONS:  Current Outpatient Medications  Medication Sig Dispense Refill   acyclovir (ZOVIRAX) 400 MG tablet TAKE 1 TABLET BY MOUTH TWICE A DAY 60 tablet 5   albuterol (VENTOLIN HFA) 108 (90 Base) MCG/ACT inhaler Inhale 2 puffs into the lungs every 4 (four) hours as needed.     aspirin EC 81 MG tablet Take 1 tablet (81 mg total) by mouth daily. Swallow whole. 30 tablet 0   Continuous Blood Gluc Sensor (FREESTYLE LIBRE 14 DAY SENSOR) MISC SMARTSIG:1 Kit(s) Topical Every 2 Weeks     dexamethasone (DECADRON) 4 MG tablet Take 5 tablets (20 mg total) by mouth once a week. 60 tablet 0   docusate sodium (COLACE) 100 MG capsule Take 1 capsule by mouth daily.     EPINEPHrine 0.3 mg/0.3 mL IJ SOAJ injection Inject 0.3 mg into the muscle as needed.     ferrous sulfate 325 (65 FE) MG tablet Take 1 tablet (325 mg total) by mouth 2 (two) times daily with a meal. 60 tablet 2   fluconazole (DIFLUCAN) 150 MG tablet Take 150 mg by mouth daily.     gabapentin (NEURONTIN) 300 MG capsule Take 300 mg by mouth at bedtime.     insulin glargine (LANTUS SOLOSTAR) 100 UNIT/ML Solostar Pen Inject 17 Units into the skin 2 (two) times daily. Pt takes in morning and bedtime.     KLOR-CON M20 20 MEQ tablet TAKE 2 TABLETS BY MOUTH TWICE A DAY 120 tablet 0   lenalidomide (REVLIMID) 10 MG capsule Take 1 capsule (10 mg total) by mouth daily. Take for 21 days, then hold for 7 days. Repeat every 28 days. 21 capsule 0   megestrol (MEGACE) 40 MG/ML suspension TAKE 10 MLS (400 MG TOTAL) BY MOUTH DAILY. 240 mL 0   metoprolol succinate (TOPROL-XL) 50 MG 24 hr tablet TAKE 1 TABLET (50 MG TOTAL) BY MOUTH DAILY IN THE MORNING     mirtazapine (REMERON) 7.5 MG  tablet Take 1 tablet (7.5 mg total) by mouth at bedtime. 30 tablet 2   omeprazole (PRILOSEC) 20 MG capsule Take 1 capsule (20 mg total) by mouth daily. 30 capsule 1   traMADol (ULTRAM) 50 MG tablet Take 1 tablet (50 mg total) by mouth every 6 (six) hours as needed for severe pain. 8 tablet 0   amLODipine (NORVASC) 5 MG tablet Take 5 mg by mouth daily.     furosemide (LASIX) 20 MG tablet TAKE 2 TABLETS BY MOUTH DAILY. TAKE 1  TABLET DAILY FOR 3 DAYS OR UNTIL SWELLING IMPROVES (Patient not taking: No sig reported) 60 tablet 3   hydrOXYzine (ATARAX/VISTARIL) 10 MG tablet Take 10 mg by mouth 2 (two) times daily as needed. (Patient not taking: No sig reported)     LORazepam (ATIVAN) 0.5 MG tablet Take 1 tablet (0.5 mg total) by mouth every 6 (six) hours as needed (nausea vomiting). (Patient not taking: No sig reported) 30 tablet 0   ondansetron (ZOFRAN) 8 MG tablet TAKE 8 MG BY MOUTH 30 TO 60 MIN PRIOR TO CYTOXAN ADMINISTRATION THEN TAKE 8 MG TWICE DAILY AS NEEDED FOR NAUSEA AND VOMITING. (Patient not taking: No sig reported) 30 tablet 1   ondansetron (ZOFRAN) 8 MG tablet Take by mouth. (Patient not taking: No sig reported)     promethazine (PHENERGAN) 25 MG tablet TAKE 1 TABLET BY MOUTH EVERY 8 HOURS AS NEEDED FOR NAUSEA AND VOMITING (Patient not taking: No sig reported) 90 tablet 1   sertraline (ZOLOFT) 25 MG tablet Take by mouth. (Patient not taking: No sig reported)     No current facility-administered medications for this visit.     PHYSICAL EXAMINATION: ECOG PERFORMANCE STATUS: 1 - Symptomatic but completely ambulatory Vitals:   12/23/20 1042  BP: (!) 156/73  Pulse: 88  Resp: 16  Temp: (!) 96.1 F (35.6 C)  SpO2: 100%   Filed Weights   12/23/20 1042  Weight: 129 lb 6.4 oz (58.7 kg)    Physical Exam Constitutional:      General: She is not in acute distress. HENT:     Head: Normocephalic and atraumatic.  Eyes:     General: No scleral icterus. Cardiovascular:     Rate and  Rhythm: Normal rate and regular rhythm.     Heart sounds: Murmur heard.  Pulmonary:     Effort: Pulmonary effort is normal. No respiratory distress.     Breath sounds: No wheezing.  Abdominal:     General: Bowel sounds are normal. There is no distension.     Palpations: Abdomen is soft.  Musculoskeletal:        General: No deformity. Normal range of motion.     Cervical back: Normal range of motion and neck supple.     Comments: Bilateral lower extremities trace edema  Skin:    General: Skin is warm and dry.     Findings: No erythema or rash.  Neurological:     Mental Status: She is alert and oriented to person, place, and time. Mental status is at baseline.     Cranial Nerves: No cranial nerve deficit.     Coordination: Coordination normal.  Psychiatric:        Mood and Affect: Mood normal.    LABORATORY DATA:  I have reviewed the data as listed Lab Results  Component Value Date   WBC 5.0 12/23/2020   HGB 8.0 (L) 12/23/2020   HCT 23.6 (L) 12/23/2020   MCV 78.7 (L) 12/23/2020   PLT 479 (H) 12/23/2020   Recent Labs    11/24/20 0815 12/08/20 0843 12/23/20 1020  NA 133* 132* 134*  K 3.4* 3.2* 3.3*  CL 104 101 104  CO2 _0 GLUCOSE 247* 226* 201*  BUN _1 CREATININE 0.89 0.78 0.75  CALCIUM 8.9 8.2* 8.6*  GFRNONAA >60 >60 >60  PROT 6.1* 5.2* 5.6*  ALBUMIN 2.3* 1.9* 2.2*  AST 41 37 47*  ALT 42 32 32  ALKPHOS 985* 805* 877*  BILITOT 3.6* 3.2*  3.6*    Iron/TIBC/Ferritin/ %Sat    Component Value Date/Time   IRON 35 08/31/2020 1037   TIBC 290 08/31/2020 1037   FERRITIN 82 08/31/2020 1037   IRONPCTSAT 12 08/31/2020 1037     08/25/2019, platelet count 491, WBC 7.5, hemoglobin 12 Creatinine 1.58, EGFR 37, calcium 10.8, albumin 4.2 Negative hepatitis B surface antigen, hepatitis B core antibody, Negative hepatitis C 08/05/2019, A1c 11.2   RADIOGRAPHIC STUDIES: I have personally reviewed the radiological images as listed and agreed with the findings in  the report. MR CERVICAL SPINE WO CONTRAST  Result Date: 10/10/2020 CLINICAL DATA:  Cervical myelopathy. Additional history provided by scanning technologist: Patient reports fall June 3rd resulting in shoulder dislocation, numbness of left 4/5 fingers, difficulty with movement. EXAM: MRI CERVICAL SPINE WITHOUT CONTRAST TECHNIQUE: Multiplanar, multisequence MR imaging of the cervical spine was performed. No intravenous contrast was administered. COMPARISON:  No pertinent prior exams available for comparison. FINDINGS: Alignment: Reversal of the expected cervical lordosis. Trace C2-C3 grade 1 anterolisthesis. 2 mm C3-C4 grade 1 anterolisthesis. Trace C5-C6 grade 1 retrolisthesis. Trace C7-T1 grade 1 anterolisthesis. Vertebrae: Vertebral body height is maintained. No significant marrow edema or focal suspicious osseous lesion. Multilevel ventral osteophytes. Cord: No spinal cord signal abnormality is identified. Posterior Fossa, vertebral arteries, paraspinal tissues: Incidentally noted partially empty sella turcica. No abnormality identified within included portions of the posterior fossa. Flow voids preserved within the imaged cervical vertebral arteries. Paraspinal soft tissues within normal limits. Small bilateral atlantooccipital joint effusions. Disc levels: Moderate disc degeneration at C4-C5, C5-C6 and C6-C7. Mild disc degeneration at the remaining levels. C2-C3: Trace grade 1 anterolisthesis. Disc uncovering with tiny right center disc protrusion. Minimal facet arthrosis. No significant spinal canal or foraminal stenosis. C3-C4: Grade 1 anterolisthesis. Disc uncovering with small central disc protrusion. Uncovertebral hypertrophy. The disc protrusion mildly effaces the ventral thecal sac without significant spinal cord mass effect. Bilateral neural foraminal narrowing (mild right, moderate left). C4-C5: Posterior disc osteophyte complex with bilateral disc osteophyte ridge/uncinate hypertrophy. Superimposed  small central disc protrusion. Mild spinal canal narrowing with contact upon the ventral spinal cord. Moderate/severe bilateral neural foraminal narrowing (greater on the right). C5-C6: Trace grade 1 retrolisthesis. Disc bulge. Superimposed small central disc extrusion with cranial migration to the mid L5 vertebral body level (series 9, image 8). Right-sided disc osteophyte ridge/uncinate hypertrophy. Mild relative spinal canal narrowing. Severe right neural foraminal narrowing. C6-C7: Disc bulge with bilateral disc osteophyte ridge/uncinate hypertrophy. Mild spinal canal narrowing. Bilateral neural foraminal narrowing (moderate/severe right, moderate left). C7-T1: Trace grade 1 anterolisthesis. Slight disc uncovering. Superimposed small central disc protrusion. Minimal facet arthrosis minimal partial effacement of the ventral thecal sac without spinal cord mass effect. No significant foraminal narrowing. T1-T2: Imaged sagittally. Small central disc protrusion. Mild effacement of the ventral thecal sac without appreciable spinal cord mass effect. No significant foraminal stenosis. T2-T3: Trace grade 1 anterolisthesis. No appreciable significant disc herniation or spinal canal stenosis. Facet arthrosis/ligamentum flavum hypertrophy. Mild bilateral neural foraminal narrowing (greater on the right). IMPRESSION: Cervical spondylosis, as outlined. No more than mild spinal canal stenosis. Most notably, a posterior disc osteophyte complex and superimposed small central disc protrusion contact the ventral spinal cord at C4-C5. Multilevel neural foraminal narrowing, as detailed and greatest on the left at C3-C4 (moderate), bilaterally at C4-C5 (moderate/severe) and bilaterally at C6-C7 (moderate/severe right, moderate left). Reversal of the expected cervical lordosis. Mild multilevel grade 1 spondylolisthesis, as above. Small bilateral atlantooccipital joint effusions. Electronically Signed   By: Marylyn Ishihara  Golden DO   On:  10/10/2020 10:42   MR LUMBAR SPINE WO CONTRAST  Result Date: 10/10/2020 CLINICAL DATA:  Closed compression fracture L4 vertebral body. EXAM: MRI LUMBAR SPINE WITHOUT CONTRAST TECHNIQUE: Multiplanar, multisequence MR imaging of the lumbar spine was performed. No intravenous contrast was administered. COMPARISON:  Lumbar radiographs 09/21/2020.  Lumbar MRI 05/11/2020 FINDINGS: Segmentation:  Normal Alignment: Mild retrolisthesis L1-2, L2-3, L3-4. Mild anterolisthesis L4-5 Vertebrae: Moderate compression fracture of L4. No underlying lesion is suspected. There is mild diffuse bone marrow edema. No significant retropulsion of bone into the canal. Fracture appears to involve the superior and inferior endplates. 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 No additional fracture Conus medullaris and cauda equina: Conus extends to the L2 level. Conus and cauda equina appear normal. Paraspinal and other soft tissues: Negative for paraspinous mass or adenopathy or fluid collection. Mild paraspinous edema around the L4 fracture. Disc levels: T12-L1: Small central disc protrusion unchanged from the prior MRI. No significant stenosis L1-2: Mild retrolisthesis.  Mild disc degeneration without stenosis L2-3: Mild retrolisthesis. Disc bulging and mild facet degeneration. Negative for stenosis L3-4: Disc bulging and diffuse endplate spurring. Shallow left foraminal disc protrusion has progressed. Bilateral facet hypertrophy. Progression of moderate spinal stenosis and moderate subarticular stenosis on the left. L4-5: Mild anterolisthesis. Central disc protrusion unchanged. Mild left foraminal disc protrusion unchanged. Advanced facet degeneration. Moderate spinal stenosis with progression. Moderate subarticular stenosis on the left with progression. L5-S1: Disc degeneration with diffuse endplate spurring. Mild foraminal stenosis bilaterally unchanged. IMPRESSION: 1. Moderately severe acute or subacute fracture of L4  without significant retropulsion of bone into the canal. Large hemangioma L1. No other fracture 2. Moderate spinal stenosis at L3-4 has progressed. Shallow left foraminal disc protrusion has progressed. Moderate subarticular stenosis on the left also with progression 3. Moderate spinal stenosis L4-5 with progression. Moderate subarticular stenosis on the left with progression. Electronically Signed   By: Franchot Gallo M.D.   On: 10/10/2020 10:41   MM 3D SCREEN BREAST BILATERAL  Result Date: 12/02/2020 CLINICAL DATA:  Screening. EXAM: DIGITAL SCREENING BILATERAL MAMMOGRAM WITH TOMOSYNTHESIS AND CAD TECHNIQUE: Bilateral screening digital craniocaudal and mediolateral oblique mammograms were obtained. Bilateral screening digital breast tomosynthesis was performed. The images were evaluated with computer-aided detection. COMPARISON:  Previous exam(s). ACR Breast Density Category c: The breast tissue is heterogeneously dense, which may obscure small masses. FINDINGS: There are no findings suspicious for malignancy. IMPRESSION: No mammographic evidence of malignancy. A result letter of this screening mammogram will be mailed directly to the patient. RECOMMENDATION: Screening mammogram in one year. (Code:SM-B-01Y) BI-RADS CATEGORY  1: Negative. Electronically Signed   By: Lillia Mountain M.D.   On: 12/02/2020 12:22  US Abdomen Limited RUQ (LIVER/GB)  Result Date: 10/31/2020 CLINICAL DATA:  Hyperbilirubinemia. EXAM: ULTRASOUND ABDOMEN LIMITED RIGHT UPPER QUADRANT COMPARISON:  CT of the abdomen and pelvis without contrast 09/02/2020. Right upper quadrant ultrasound 08/25/2020. FINDINGS: Gallbladder: Large gallstones scratched at large shadowing gallstones noted. These measure up to 9 mm. Gallbladder is contracted. Wall thickness measures 5 mm, likely due to contraction. No sonographic Percell Miller sign is present. Common bile duct: Diameter: 4 mm Liver: Simple cyst the left lobe measures up to 1.4 cm. Hepatic parenchyma  within normal limits. Portal vein is patent on color Doppler imaging with normal direction of blood flow towards the liver. Other: Small amount of ascites is noted. IMPRESSION: 1. Cholelithiasis without evidence for acute cholecystitis. 2. Small amount of ascites. 3. Normal sonographic appearance of the liver.  Stable cyst. Electronically Signed   By: San Morelle M.D.   On: 10/31/2020 09:46       ASSESSMENT & PLAN:  1. Light chain (AL) amyloidosis (HCC)   2. Encounter for antineoplastic chemotherapy   3. 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[place])   4. Hyperbilirubinemia   5. Vaginal bleeding   6. Gastroesophageal reflux disease, unspecified whether esophagitis present   7. Elevated LFTs   8. Chemotherapy induced nausea and vomiting    # AL-Amyloidosis Liver involvement, possible kidney involvement-on second line treatment with daratumumab Revlimid dexamethasone. Labs reviewed and discussed with patient. Proceed with cycle 3 Revlimid 10 mg plan 3 weeks on 1 week off. Proceed with daratumumab today-now on every 4 weeks Continue aspirin 81 mg daily.-Otc  #Anemia is multifactorial, from CKD, marrow suppression and iron deficiency. Continue oral iron supplementation.  Monitor closely. She may also have underlying hemoglobinopathy due to the chronically decreased MCV. Marland Kitchen    #Elevated LFT, due to amyloidosis liver involvement.  Ultrasound showed small amount of ascites, stable liver cyst. Bilirubin is improved since dc of Velcade.  Total bilirubin is 3.6 today.  #GERD, continue omeprazole 40 mg daily. 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   #Chemotherapy-induced nausea, stable symptoms.   #Lack of IV access, discussed about option of Mediport placement.  She declined. #Hypokalemia due to lasix, potassium level 3.Marland Kitchenadvised patient to take potassium to 64mq twice daily  #Chronic kidney disease, probably AL involvement follow-up with nephrology.  Stable  creatinine. Repeat 24-hour urine protein, ordered previously and she did not do,   #Anxiety, recommend patient to take Remeron-unclear if patient is following the recommendation. # Pruritis, on Zoloft per hepatologist.  #Unintentional weight loss, continue Megace  #Bilateral lower extremity swelling, neg DVT.  Improved, off Lasix.  # vaginal bleeding, seen by Gyn, s/p repeat biopsy.  Appreciate GYN opinion.  We spent sufficient time to discuss many aspect of care, questions were answered to patient's satisfaction.  Lab MD 1 week  cc FLeonel Ramsay MD   ZEarlie Server MD, PhD [facility] 12/23/2020

## 2020-12-24 LAB — BETA 2 MICROGLOBULIN, SERUM: Beta-2 Microglobulin: 2.5 mg/L — ABNORMAL HIGH (ref 0.6–2.4)

## 2020-12-26 LAB — KAPPA/LAMBDA LIGHT CHAINS
Kappa free light chain: 11.5 mg/L (ref 3.3–19.4)
Kappa, lambda light chain ratio: 1.26 (ref 0.26–1.65)
Lambda free light chains: 9.1 mg/L (ref 5.7–26.3)

## 2020-12-27 LAB — MULTIPLE MYELOMA PANEL, SERUM
Albumin SerPl Elph-Mcnc: 2.1 g/dL — ABNORMAL LOW (ref 2.9–4.4)
Albumin/Glob SerPl: 0.9 (ref 0.7–1.7)
Alpha 1: 0.3 g/dL (ref 0.0–0.4)
Alpha2 Glob SerPl Elph-Mcnc: 0.8 g/dL (ref 0.4–1.0)
B-Globulin SerPl Elph-Mcnc: 1 g/dL (ref 0.7–1.3)
Gamma Glob SerPl Elph-Mcnc: 0.4 g/dL (ref 0.4–1.8)
Globulin, Total: 2.5 g/dL (ref 2.2–3.9)
IgA: 107 mg/dL (ref 64–422)
IgG (Immunoglobin G), Serum: 452 mg/dL — ABNORMAL LOW (ref 586–1602)
IgM (Immunoglobulin M), Srm: 48 mg/dL (ref 26–217)
M Protein SerPl Elph-Mcnc: 0.1 g/dL — ABNORMAL HIGH
Total Protein ELP: 4.6 g/dL — ABNORMAL LOW (ref 6.0–8.5)

## 2020-12-30 ENCOUNTER — Encounter: Payer: Self-pay | Admitting: Oncology

## 2020-12-30 ENCOUNTER — Telehealth: Payer: Self-pay

## 2020-12-30 ENCOUNTER — Inpatient Hospital Stay: Payer: Medicare Other

## 2020-12-30 ENCOUNTER — Inpatient Hospital Stay (HOSPITAL_BASED_OUTPATIENT_CLINIC_OR_DEPARTMENT_OTHER): Payer: Medicare Other | Admitting: Oncology

## 2020-12-30 VITALS — BP 116/82 | HR 99 | Temp 97.6°F | Resp 18 | Wt 135.3 lb

## 2020-12-30 DIAGNOSIS — Z5111 Encounter for antineoplastic chemotherapy: Secondary | ICD-10-CM

## 2020-12-30 DIAGNOSIS — E8581 Light chain (AL) amyloidosis: Secondary | ICD-10-CM

## 2020-12-30 DIAGNOSIS — D631 Anemia in chronic kidney disease: Secondary | ICD-10-CM

## 2020-12-30 DIAGNOSIS — N1831 Chronic kidney disease, stage 3a: Secondary | ICD-10-CM

## 2020-12-30 DIAGNOSIS — N939 Abnormal uterine and vaginal bleeding, unspecified: Secondary | ICD-10-CM

## 2020-12-30 DIAGNOSIS — R634 Abnormal weight loss: Secondary | ICD-10-CM

## 2020-12-30 DIAGNOSIS — R7989 Other specified abnormal findings of blood chemistry: Secondary | ICD-10-CM

## 2020-12-30 LAB — CBC WITH DIFFERENTIAL/PLATELET
Abs Immature Granulocytes: 0.09 10*3/uL — ABNORMAL HIGH (ref 0.00–0.07)
Basophils Absolute: 0.1 10*3/uL (ref 0.0–0.1)
Basophils Relative: 1 %
Eosinophils Absolute: 0.2 10*3/uL (ref 0.0–0.5)
Eosinophils Relative: 2 %
HCT: 24.4 % — ABNORMAL LOW (ref 36.0–46.0)
Hemoglobin: 8.4 g/dL — ABNORMAL LOW (ref 12.0–15.0)
Immature Granulocytes: 1 %
Lymphocytes Relative: 16 %
Lymphs Abs: 1.4 10*3/uL (ref 0.7–4.0)
MCH: 27.1 pg (ref 26.0–34.0)
MCHC: 34.4 g/dL (ref 30.0–36.0)
MCV: 78.7 fL — ABNORMAL LOW (ref 80.0–100.0)
Monocytes Absolute: 0.5 10*3/uL (ref 0.1–1.0)
Monocytes Relative: 6 %
Neutro Abs: 6.5 10*3/uL (ref 1.7–7.7)
Neutrophils Relative %: 74 %
Platelets: 363 10*3/uL (ref 150–400)
RBC: 3.1 MIL/uL — ABNORMAL LOW (ref 3.87–5.11)
RDW: 24 % — ABNORMAL HIGH (ref 11.5–15.5)
WBC: 8.8 10*3/uL (ref 4.0–10.5)
nRBC: 0.6 % — ABNORMAL HIGH (ref 0.0–0.2)

## 2020-12-30 LAB — COMPREHENSIVE METABOLIC PANEL
ALT: 32 U/L (ref 0–44)
AST: 45 U/L — ABNORMAL HIGH (ref 15–41)
Albumin: 2.3 g/dL — ABNORMAL LOW (ref 3.5–5.0)
Alkaline Phosphatase: 850 U/L — ABNORMAL HIGH (ref 38–126)
Anion gap: 10 (ref 5–15)
BUN: 17 mg/dL (ref 8–23)
CO2: 22 mmol/L (ref 22–32)
Calcium: 8.7 mg/dL — ABNORMAL LOW (ref 8.9–10.3)
Chloride: 104 mmol/L (ref 98–111)
Creatinine, Ser: 0.74 mg/dL (ref 0.44–1.00)
GFR, Estimated: >60 mL/min (ref >60)
Glucose, Bld: 149 mg/dL — ABNORMAL HIGH (ref 70–99)
Potassium: 3.8 mmol/L (ref 3.5–5.1)
Sodium: 136 mmol/L (ref 135–145)
Total Bilirubin: 3 mg/dL — ABNORMAL HIGH (ref 0.3–1.2)
Total Protein: 5.9 g/dL — ABNORMAL LOW (ref 6.5–8.1)

## 2020-12-30 LAB — SAMPLE TO BLOOD BANK

## 2020-12-30 NOTE — Progress Notes (Signed)
Hematology/Oncology  Follow up note [facility] [phone] [phone]   Patient Care Team: Nancy Ramsay, MD as PCP - General (Infectious Diseases) End, Nancy Gave, MD as PCP - Cardiology (Cardiology)  REFERRING PROVIDER: Leonel Ramsay, MD  CHIEF COMPLAINTS/REASON FOR VISIT:  Follow-up for Rodriguez  HISTORY OF PRESENTING ILLNESS:   Nancy Rodriguez is a  75 y.o.  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  Work up include protein electrophoresis showed M protein 0.7,   Reviewed her previous medical records via care everywhere.  She was seen by Hematology Oncology at [facility] on 02/14/2017.  07/25/2007  SPEP showed M protein of 0.35, IFE showed IgG lamda 09/22/2007  Bone survey negative.  02/15/14 IgG 930, SPEP M protien 0.22, free kappa light chain 3.59, lamda 2.92, ratio 1.23  Hypercalcemia, resolved after stopping HCTZ.  # Transaminitis 03/28/20 US abdomen showed left liver lobe Complex 1.7 cm cyst  # 04/07/20 MRI abdomen w/wo contrast showed 2 benign liver cysts, 2 nonspecific hypovascular irighr liver lesions, abnormal bone marrow signal of L1 vertebral body. Patient was  advised to proceed with bone marrow biopsy and she declined.  # referred her to established care with GI and was seen on 04/19/20,  # Liver biopsy showed Rodriguez. Nancy Rodriguez showed Nancy type # 05/20/20 recommend bone marrow biopsy which is scheduled on 05/24/2020.  Patient changed her mind and ask a biopsy to be canceled.  My team and I had multiple phone discussion with patient's daughter Nancy Rodriguez and later with the patient's son Nancy Rodriguez.  Patient agreed with bone marrow biopsy and a biopsy was scheduled on 06/09/2020.  05/20/20 NT proBNP  was normal,  TSH slightly increased, normal  T4 Normal factor X Normal coags Troponin 19. Refer to cardiology for evaluation. May need cardiac MRI  # 06/01/2020 2D echo result was reviewed and discussed with patient.  Patient has normal LVEF 60-65%. Mild asymmetric left ventricular hypertrophy.  Left ventricular diastolic parameters are indeterminate. average left ventricular global longitudinal strain is -16.8 %.  # 06/09/2020, bone marrow biopsy showed monoclonal plasmacytosis, 8%, amyloid deposit present.  Absent iron stores. 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 t (14;20) #06/17/2020, further discussed about diagnosis and treatment plan.  Patient declined bone marrow transplant evaluation.  Decision was made to proceed with Dara-CyborD chemotherapy treatments. #May 2022, second opinion at [place] with Dr. Tracey Rodriguez.who agrees with current treatment plan.  He recommends to lower dexamethasone to 20 mg and decrease premed Tylenol to 325 mg  09/01/2020 cardiac MRI was performed at [facility].  Left ventricle is normal in size.  Mild to moderate basal septal hypertrophy.  Hyper dynamic LV function.  LVEF 71%.  Right ventricle is normal in size and wall thickness.  Systolic function normal.  Mild bi atrial enlargement, no significant aortic valve stenosis or regurgitation.  Trivial mitral regurgitation, mild tricuspid regurgitation and a trivial pulmonic regurgitation.  No evidence of MI, scarring or infiltration.  #History of major depression listed in outside problem list.  previous diagnosis and treatment details are not available to me in EMR  I have referred her to psychiatrist  #  vaginal bleeding which stopped- she was seen by GYN and recommended for biopsy.   # She has poor IV access  and was not able to receive IV Emend and IV Aloxi. She does not want to have med port.   # GERD   Dr. Ola Rodriguez has increased her omeprazole to 40 mg daily. She feels symptoms are improved.  # 10/08/2020, cervical  spine showed cervical spondylosis.  Spinal canal diagnosis and a posterior disc osteophyte complex and superimposed small central disc protrusion contact the ventral spinal cord at C4-C5.  Multilevel neuroforaminal narrowing. bilateral atlantooccipital joint effusions, multilevel grade 1 spondylolisthesis. MRI lumbar spine without contrast showed acute or subacute fracture of L4.   Moderate spinal stenosis at L3-4 has progressed. Shallow left foraminal disc protrusion has progressed. Moderate subarticular stenosis on the left also with progression  Moderate spinal stenosis L4-5 with progression. Moderate subarticular stenosis on the left with progression  # 10/25/2020 seen by Nancy Rodriguez who recommend patient to switch Dara CyBorD to Dara RD  # 11/03/2020 Revlimid (lenalidomide) 10 mg by mouth daily for 14 days, then hold for 7 days. Pt was started on a shortened cycle in order to remain on similar schedule with infusion.   # 11/22/2020 seen by hepatologist Nancy Rodriguez. Recommended to start Zoloft 56m daily for pruritis # 11/24/2020 cycle 2 Revlimid 10 mg, she finished 14 days.  Rest of the course was held due to anemia.  Patient declined blood transfusion.    INTERVAL HISTORY Nancy RUGGIEROis a 75y.o. female who has above history reviewed by me today presents for follow up visit for management of Nancy Rodriguez Problems and complaints are listed below: Patient has vaginal spotting, improving. Patient was seen by Nancy Rodriguez started on megace 464mdaily  12/22/2020 repeat EMBx - not diagnostic due to insufficient squamous component.    Review of Systems  Constitutional:  Positive for fatigue. Negative for appetite change, chills, fever and unexpected weight change.  HENT:   Negative for hearing loss, nosebleeds and voice change.   Eyes:  Positive for icterus. Negative for eye problems.  Respiratory:  Negative for chest tightness and cough.   Cardiovascular:  Negative for chest  pain and leg swelling.  Gastrointestinal:  Negative for abdominal distention, abdominal pain, blood in stool and nausea.  Endocrine: Negative for hot flashes.  Genitourinary:  Negative for difficulty urinating and frequency.   Musculoskeletal:  Positive for back pain. Negative for arthralgias.  Skin:  Negative for rash.       jaundice  Neurological:  Positive for numbness. Negative for extremity weakness.  Hematological:  Negative for adenopathy.  Psychiatric/Behavioral:  Negative for confusion.    MEDICAL HISTORY:  Past Medical History:  Diagnosis Date   Anemia    Anemia in chronic kidney disease   Chronic kidney disease    Stage 3b chronic kidney disease   Diabetes mellitus without complication (HCC)    Hypercholesterolemia    Hypertension    MGUS (monoclonal gammopathy of unknown significance)    Osteoarthritis    Rheumatoid arthritis ([place]    SURGICAL HISTORY: Past Surgical History:  Procedure Laterality Date   HERNIA REPAIR     PARATHYROIDECTOMY      SOCIAL HISTORY: Social History   Socioeconomic History   Marital status: Widowed    Spouse name: Not on file   Number of children: 6   Years of education: Not on file   Highest education level: Not on file  Occupational History   Not on file  Tobacco Use   Smoking status: Never   Smokeless tobacco: Never  Vaping  Use   Vaping Use: Never used  Substance and Sexual Activity   Alcohol use: No   Drug use: Never   Sexual activity: Not on file  Other Topics Concern   Not on file  Social History Narrative   Not on file   Social Determinants of Health   Financial Resource Strain: Not on file  Food Insecurity: Not on file  Transportation Needs: Not on file  Physical Activity: Not on file  Stress: Not on file  Social Connections: Not on file  Intimate Partner Violence: Not on file    FAMILY HISTORY: Family History  Problem Relation Age of Onset   Diabetes Daughter    Diabetes Son    Dementia Mother     Cancer Father    Esophageal cancer Sister    Brain cancer Brother     ALLERGIES:  is allergic to benazepril, lisinopril, tolmetin, and nsaids.  MEDICATIONS:  Current Outpatient Medications  Medication Sig Dispense Refill   acyclovir (ZOVIRAX) 400 MG tablet TAKE 1 TABLET BY MOUTH TWICE A DAY 60 tablet 5   albuterol (VENTOLIN HFA) 108 (90 Base) MCG/ACT inhaler Inhale 2 puffs into the lungs every 4 (four) hours as needed.     amLODipine (NORVASC) 5 MG tablet Take 5 mg by mouth daily.     aspirin EC 81 MG tablet Take 1 tablet (81 mg total) by mouth daily. Swallow whole. 30 tablet 0   Continuous Blood Gluc Sensor (FREESTYLE LIBRE 14 DAY SENSOR) MISC SMARTSIG:1 Kit(s) Topical Every 2 Weeks     dexamethasone (DECADRON) 4 MG tablet Take 5 tablets (20 mg total) by mouth once a week. 60 tablet 0   docusate sodium (COLACE) 100 MG capsule Take 1 capsule by mouth daily.     ferrous sulfate 325 (65 FE) MG tablet Take 1 tablet (325 mg total) by mouth 2 (two) times daily with a meal. 60 tablet 2   fluconazole (DIFLUCAN) 150 MG tablet Take 150 mg by mouth daily.     furosemide (LASIX) 20 MG tablet TAKE 2 TABLETS BY MOUTH DAILY. TAKE 1 TABLET DAILY FOR 3 DAYS OR UNTIL SWELLING IMPROVES 60 tablet 3   gabapentin (NEURONTIN) 300 MG capsule Take 300 mg by mouth at bedtime.     hydrOXYzine (ATARAX/VISTARIL) 10 MG tablet Take 10 mg by mouth 2 (two) times daily as needed.     insulin glargine (LANTUS SOLOSTAR) 100 UNIT/ML Solostar Pen Inject 17 Units into the skin 2 (two) times daily. Pt takes in morning and bedtime.     KLOR-CON M20 20 MEQ tablet TAKE 2 TABLETS BY MOUTH TWICE A DAY 120 tablet 0   lenalidomide (REVLIMID) 10 MG capsule Take 1 capsule (10 mg total) by mouth daily. Take for 21 days, then hold for 7 days. Repeat every 28 days. 21 capsule 0   LORazepam (ATIVAN) 0.5 MG tablet Take 1 tablet (0.5 mg total) by mouth every 6 (six) hours as needed (nausea vomiting). 30 tablet 0   megestrol (MEGACE) 40 MG/ML  suspension TAKE 10 MLS (400 MG TOTAL) BY MOUTH DAILY. 240 mL 0   metoprolol succinate (TOPROL-XL) 50 MG 24 hr tablet TAKE 1 TABLET (50 MG TOTAL) BY MOUTH DAILY IN THE MORNING     mirtazapine (REMERON) 7.5 MG tablet Take 1 tablet (7.5 mg total) by mouth at bedtime. 30 tablet 2   omeprazole (PRILOSEC) 20 MG capsule Take 1 capsule (20 mg total) by mouth daily. 30 capsule 1   ondansetron (ZOFRAN) 8 MG  tablet TAKE 8 MG BY MOUTH 30 TO 60 MIN PRIOR TO CYTOXAN ADMINISTRATION THEN TAKE 8 MG TWICE DAILY AS NEEDED FOR NAUSEA AND VOMITING. 30 tablet 1   promethazine (PHENERGAN) 25 MG tablet TAKE 1 TABLET BY MOUTH EVERY 8 HOURS AS NEEDED FOR NAUSEA AND VOMITING 90 tablet 1   traMADol (ULTRAM) 50 MG tablet Take 1 tablet (50 mg total) by mouth every 6 (six) hours as needed for severe pain. 8 tablet 0   EPINEPHrine 0.3 mg/0.3 mL IJ SOAJ injection Inject 0.3 mg into the muscle as needed. (Patient not taking: Reported on 12/30/2020)     ondansetron (ZOFRAN) 8 MG tablet Take by mouth. (Patient not taking: No sig reported)     sertraline (ZOLOFT) 25 MG tablet Take by mouth. (Patient not taking: No sig reported)     No current facility-administered medications for this visit.     PHYSICAL EXAMINATION: ECOG PERFORMANCE STATUS: 1 - Symptomatic but completely ambulatory Vitals:   12/30/20 0917  BP: 116/82  Pulse: 99  Resp: 18  Temp: 97.6 F (36.4 C)   Filed Weights   12/30/20 0917  Weight: 135 lb 4.8 oz (61.4 kg)    Physical Exam Constitutional:      General: She is not in acute distress. HENT:     Head: Normocephalic and atraumatic.  Eyes:     General: No scleral icterus. Cardiovascular:     Rate and Rhythm: Normal rate and regular rhythm.     Heart sounds: Murmur heard.  Pulmonary:     Effort: Pulmonary effort is normal. No respiratory distress.     Breath sounds: No wheezing.  Abdominal:     General: Bowel sounds are normal. There is no distension.     Palpations: Abdomen is soft.   Musculoskeletal:        General: No deformity. Normal range of motion.     Cervical back: Normal range of motion and neck supple.     Comments: Bilateral lower extremities trace edema  Skin:    General: Skin is warm and dry.     Findings: No erythema or rash.  Neurological:     Mental Status: She is alert and oriented to person, place, and time. Mental status is at baseline.     Cranial Nerves: No cranial nerve deficit.     Coordination: Coordination normal.  Psychiatric:        Mood and Affect: Mood normal.    LABORATORY DATA:  I have reviewed the data as listed Lab Results  Component Value Date   WBC 8.8 12/30/2020   HGB 8.4 (L) 12/30/2020   HCT 24.4 (L) 12/30/2020   MCV 78.7 (L) 12/30/2020   PLT 363 12/30/2020   Recent Labs    12/08/20 0843 12/23/20 1020 12/30/20 0839  NA 132* 134* 136  K 3.2* 3.3* 3.8  CL 101 104 104  CO2 _0 GLUCOSE 226* 201* 149*  BUN _1 CREATININE 0.78 0.75 0.74  CALCIUM 8.2* 8.6* 8.7*  GFRNONAA >60 >60 >60  PROT 5.2* 5.6* 5.9*  ALBUMIN 1.9* 2.2* 2.3*  AST 37 47* 45*  ALT 32 32 32  ALKPHOS 805* 877* 850*  BILITOT 3.2* 3.6* 3.0*    Iron/TIBC/Ferritin/ %Sat    Component Value Date/Time   IRON 35 08/31/2020 1037   TIBC 290 08/31/2020 1037   FERRITIN 82 08/31/2020 1037   IRONPCTSAT 12 08/31/2020 1037     08/25/2019, platelet count 491, WBC 7.5, hemoglobin 12  Creatinine 1.58, EGFR 37, calcium 10.8, albumin 4.2 Negative hepatitis B surface antigen, hepatitis B core antibody, Negative hepatitis C 08/05/2019, A1c 11.2   RADIOGRAPHIC STUDIES: I have personally reviewed the radiological images as listed and agreed with the findings in the report. MR CERVICAL SPINE WO CONTRAST  Result Date: 10/10/2020 CLINICAL DATA:  Cervical myelopathy. Additional history provided by scanning technologist: Patient reports fall June 3rd resulting in shoulder dislocation, numbness of left 4/5 fingers, difficulty with movement. EXAM: MRI  CERVICAL SPINE WITHOUT CONTRAST TECHNIQUE: Multiplanar, multisequence MR imaging of the cervical spine was performed. No intravenous contrast was administered. COMPARISON:  No pertinent prior exams available for comparison. FINDINGS: Alignment: Reversal of the expected cervical lordosis. Trace C2-C3 grade 1 anterolisthesis. 2 mm C3-C4 grade 1 anterolisthesis. Trace C5-C6 grade 1 retrolisthesis. Trace C7-T1 grade 1 anterolisthesis. Vertebrae: Vertebral body height is maintained. No significant marrow edema or focal suspicious osseous lesion. Multilevel ventral osteophytes. Cord: No spinal cord signal abnormality is identified. Posterior Fossa, vertebral arteries, paraspinal tissues: Incidentally noted partially empty sella turcica. No abnormality identified within included portions of the posterior fossa. Flow voids preserved within the imaged cervical vertebral arteries. Paraspinal soft tissues within normal limits. Small bilateral atlantooccipital joint effusions. Disc levels: Moderate disc degeneration at C4-C5, C5-C6 and C6-C7. Mild disc degeneration at the remaining levels. C2-C3: Trace grade 1 anterolisthesis. Disc uncovering with tiny right center disc protrusion. Minimal facet arthrosis. No significant spinal canal or foraminal stenosis. C3-C4: Grade 1 anterolisthesis. Disc uncovering with small central disc protrusion. Uncovertebral hypertrophy. The disc protrusion mildly effaces the ventral thecal sac without significant spinal cord mass effect. Bilateral neural foraminal narrowing (mild right, moderate left). C4-C5: Posterior disc osteophyte complex with bilateral disc osteophyte ridge/uncinate hypertrophy. Superimposed small central disc protrusion. Mild spinal canal narrowing with contact upon the ventral spinal cord. Moderate/severe bilateral neural foraminal narrowing (greater on the right). C5-C6: Trace grade 1 retrolisthesis. Disc bulge. Superimposed small central disc extrusion with cranial  migration to the mid L5 vertebral body level (series 9, image 8). Right-sided disc osteophyte ridge/uncinate hypertrophy. Mild relative spinal canal narrowing. Severe right neural foraminal narrowing. C6-C7: Disc bulge with bilateral disc osteophyte ridge/uncinate hypertrophy. Mild spinal canal narrowing. Bilateral neural foraminal narrowing (moderate/severe right, moderate left). C7-T1: Trace grade 1 anterolisthesis. Slight disc uncovering. Superimposed small central disc protrusion. Minimal facet arthrosis minimal partial effacement of the ventral thecal sac without spinal cord mass effect. No significant foraminal narrowing. T1-T2: Imaged sagittally. Small central disc protrusion. Mild effacement of the ventral thecal sac without appreciable spinal cord mass effect. No significant foraminal stenosis. T2-T3: Trace grade 1 anterolisthesis. No appreciable significant disc herniation or spinal canal stenosis. Facet arthrosis/ligamentum flavum hypertrophy. Mild bilateral neural foraminal narrowing (greater on the right). IMPRESSION: Cervical spondylosis, as outlined. No more than mild spinal canal stenosis. Most notably, a posterior disc osteophyte complex and superimposed small central disc protrusion contact the ventral spinal cord at C4-C5. Multilevel neural foraminal narrowing, as detailed and greatest on the left at C3-C4 (moderate), bilaterally at C4-C5 (moderate/severe) and bilaterally at C6-C7 (moderate/severe right, moderate left). Reversal of the expected cervical lordosis. Mild multilevel grade 1 spondylolisthesis, as above. Small bilateral atlantooccipital joint effusions. Electronically Signed   By: Kellie Simmering DO   On: 10/10/2020 10:42   MR LUMBAR SPINE WO CONTRAST  Result Date: 10/10/2020 CLINICAL DATA:  Closed compression fracture L4 vertebral body. EXAM: MRI LUMBAR SPINE WITHOUT CONTRAST TECHNIQUE: Multiplanar, multisequence MR imaging of the lumbar spine was performed. No intravenous contrast  was administered. COMPARISON:  Lumbar radiographs 09/21/2020.  Lumbar MRI 05/11/2020 FINDINGS: Segmentation:  Normal Alignment: Mild retrolisthesis L1-2, L2-3, L3-4. Mild anterolisthesis L4-5 Vertebrae: Moderate compression fracture of L4. No underlying lesion is suspected. There is mild diffuse bone marrow edema. No significant retropulsion of bone into the canal. Fracture appears to involve the superior and inferior endplates. 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 No additional fracture Conus medullaris and cauda equina: Conus extends to the L2 level. Conus and cauda equina appear normal. Paraspinal and other soft tissues: Negative for paraspinous mass or adenopathy or fluid collection. Mild paraspinous edema around the L4 fracture. Disc levels: T12-L1: Small central disc protrusion unchanged from the prior MRI. No significant stenosis L1-2: Mild retrolisthesis.  Mild disc degeneration without stenosis L2-3: Mild retrolisthesis. Disc bulging and mild facet degeneration. Negative for stenosis L3-4: Disc bulging and diffuse endplate spurring. Shallow left foraminal disc protrusion has progressed. Bilateral facet hypertrophy. Progression of moderate spinal stenosis and moderate subarticular stenosis on the left. L4-5: Mild anterolisthesis. Central disc protrusion unchanged. Mild left foraminal disc protrusion unchanged. Advanced facet degeneration. Moderate spinal stenosis with progression. Moderate subarticular stenosis on the left with progression. L5-S1: Disc degeneration with diffuse endplate spurring. Mild foraminal stenosis bilaterally unchanged. IMPRESSION: 1. Moderately severe acute or subacute fracture of L4 without significant retropulsion of bone into the canal. Large hemangioma L1. No other fracture 2. Moderate spinal stenosis at L3-4 has progressed. Shallow left foraminal disc protrusion has progressed. Moderate subarticular stenosis on the left also with progression 3. Moderate spinal  stenosis L4-5 with progression. Moderate subarticular stenosis on the left with progression. Electronically Signed   By: Franchot Gallo M.D.   On: 10/10/2020 10:41   MM 3D SCREEN BREAST BILATERAL  Result Date: 12/02/2020 CLINICAL DATA:  Screening. EXAM: DIGITAL SCREENING BILATERAL MAMMOGRAM WITH TOMOSYNTHESIS AND CAD TECHNIQUE: Bilateral screening digital craniocaudal and mediolateral oblique mammograms were obtained. Bilateral screening digital breast tomosynthesis was performed. The images were evaluated with computer-aided detection. COMPARISON:  Previous exam(s). ACR Breast Density Category c: The breast tissue is heterogeneously dense, which may obscure small masses. FINDINGS: There are no findings suspicious for malignancy. IMPRESSION: No mammographic evidence of malignancy. A result letter of this screening mammogram will be mailed directly to the patient. RECOMMENDATION: Screening mammogram in one year. (Code:SM-B-01Y) BI-RADS CATEGORY  1: Negative. Electronically Signed   By: Lillia Mountain M.D.   On: 12/02/2020 12:22  US Abdomen Limited RUQ (LIVER/GB)  Result Date: 10/31/2020 CLINICAL DATA:  Hyperbilirubinemia. EXAM: ULTRASOUND ABDOMEN LIMITED RIGHT UPPER QUADRANT COMPARISON:  CT of the abdomen and pelvis without contrast 09/02/2020. Right upper quadrant ultrasound 08/25/2020. FINDINGS: Gallbladder: Large gallstones scratched at large shadowing gallstones noted. These measure up to 9 mm. Gallbladder is contracted. Wall thickness measures 5 mm, likely due to contraction. No sonographic Percell Miller sign is present. Common bile duct: Diameter: 4 mm Liver: Simple cyst the left lobe measures up to 1.4 cm. Hepatic parenchyma within normal limits. Portal vein is patent on color Doppler imaging with normal direction of blood flow towards the liver. Other: Small amount of ascites is noted. IMPRESSION: 1. Cholelithiasis without evidence for acute cholecystitis. 2. Small amount of ascites. 3. Normal sonographic  appearance of the liver.  Stable cyst. Electronically Signed   By: San Morelle M.D.   On: 10/31/2020 09:46       ASSESSMENT & PLAN:  1. Light chain (Nancy) Rodriguez (HCC)   2. Encounter for antineoplastic chemotherapy   3. Anemia in stage 3a chronic  kidney disease ([place])   4. Hyperbilirubinemia   5. 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 Labs reviewed and discussed with patient. Currently on Daratumuab- Q4weeks, Revlimid D1-21, and Dexamethasone weekly.  Revlimid was not delivered last week. Contacted pharmacy. Advise patient to take 2 weeks of Revlimid when she gets her supply.  Continue aspirin 81 mg daily.-Otc  #Anemia is multifactorial, from CKD, marrow suppression and iron deficiency. Continue oral iron supplementation.  She may also have underlying hemoglobinopathy due to the chronically decreased MCV. Marland Kitchen Hemoglobin is stable a 8.4  #Elevated LFT, due to Rodriguez liver involvement.  Ultrasound showed small amount of ascites, stable liver cyst. Bilirubin is improved since dc of Velcade.  Total bilirubin is 3.0 today.   We spent sufficient time to discuss many aspect of care, questions were answered to patient's satisfaction.  Lab MD 3 weeks  cc Nancy Ramsay, MD   [name], MD, PhD Hematology Oncology [facility]  12/30/2020

## 2020-12-30 NOTE — Telephone Encounter (Signed)
Refill for Revlimid sent on 9/1. Pt states that pharmacy called her to let her know that medication was going to be delivered the next day, but she was not there to sign the next day due to Dr. appt, so they didnt leave the medication.   Contacted [facility] to let them know that pt has not received Revlimid and that she will need a 2 week supply sent by Monday , per MD request. Spoke to [facility], who says that medication will be sent out today. However, they are not able to do a 2 week supply since it has been already processed by insurance. Spoke to pt's daughter [name] and told her that pt needs to take 2 week supply only and leave the remaining 7 pills for next cycle.

## 2020-12-30 NOTE — Progress Notes (Signed)
Pt here for follow up. Pt reports that she continues spotting and she has seen GYN and has been prescribed Megace.

## 2020-12-31 LAB — BETA 2 MICROGLOBULIN, SERUM: Beta-2 Microglobulin: 2.8 mg/L — ABNORMAL HIGH (ref 0.6–2.4)

## 2021-01-02 LAB — KAPPA/LAMBDA LIGHT CHAINS
Kappa free light chain: 12.7 mg/L (ref 3.3–19.4)
Kappa, lambda light chain ratio: 1.18 (ref 0.26–1.65)
Lambda free light chains: 10.8 mg/L (ref 5.7–26.3)

## 2021-01-03 ENCOUNTER — Telehealth: Payer: Self-pay

## 2021-01-03 NOTE — Telephone Encounter (Signed)
Referral to home health was sent for medication management and she will be seen by [facility].

## 2021-01-04 LAB — MULTIPLE MYELOMA PANEL, SERUM
Albumin SerPl Elph-Mcnc: 2.3 g/dL — ABNORMAL LOW (ref 2.9–4.4)
Albumin/Glob SerPl: 0.9 (ref 0.7–1.7)
Alpha 1: 0.4 g/dL (ref 0.0–0.4)
Alpha2 Glob SerPl Elph-Mcnc: 0.8 g/dL (ref 0.4–1.0)
B-Globulin SerPl Elph-Mcnc: 1.1 g/dL (ref 0.7–1.3)
Gamma Glob SerPl Elph-Mcnc: 0.3 g/dL — ABNORMAL LOW (ref 0.4–1.8)
Globulin, Total: 2.6 g/dL (ref 2.2–3.9)
IgA: 106 mg/dL (ref 64–422)
IgG (Immunoglobin G), Serum: 462 mg/dL — ABNORMAL LOW (ref 586–1602)
IgM (Immunoglobulin M), Srm: 41 mg/dL (ref 26–217)
M Protein SerPl Elph-Mcnc: 0.1 g/dL — ABNORMAL HIGH
Total Protein ELP: 4.9 g/dL — ABNORMAL LOW (ref 6.0–8.5)

## 2021-01-05 ENCOUNTER — Encounter: Payer: Self-pay | Admitting: Pharmacist

## 2021-01-05 NOTE — Telephone Encounter (Addendum)
Erroneous encounter

## 2021-01-12 ENCOUNTER — Other Ambulatory Visit: Payer: Self-pay | Admitting: Pharmacist

## 2021-01-12 DIAGNOSIS — E8581 Light chain (AL) amyloidosis: Secondary | ICD-10-CM

## 2021-01-12 MED ORDER — LENALIDOMIDE 10 MG PO CAPS
10.0000 mg | ORAL_CAPSULE | Freq: Every day | ORAL | 0 refills | Status: DC
Start: 2021-01-12 — End: 2021-02-10

## 2021-01-19 ENCOUNTER — Inpatient Hospital Stay: Payer: Medicare Other | Admitting: Pharmacist

## 2021-01-19 ENCOUNTER — Inpatient Hospital Stay: Payer: Medicare Other | Attending: Oncology

## 2021-01-19 ENCOUNTER — Other Ambulatory Visit: Payer: Self-pay

## 2021-01-19 ENCOUNTER — Inpatient Hospital Stay: Payer: Medicare Other

## 2021-01-19 ENCOUNTER — Inpatient Hospital Stay (HOSPITAL_BASED_OUTPATIENT_CLINIC_OR_DEPARTMENT_OTHER): Payer: Medicare Other | Admitting: Oncology

## 2021-01-19 ENCOUNTER — Encounter: Payer: Self-pay | Admitting: Oncology

## 2021-01-19 VITALS — BP 169/78 | HR 97 | Temp 96.2°F | Resp 18

## 2021-01-19 VITALS — BP 137/71 | HR 96 | Temp 98.8°F | Resp 18 | Wt 132.6 lb

## 2021-01-19 DIAGNOSIS — Z79899 Other long term (current) drug therapy: Secondary | ICD-10-CM | POA: Insufficient documentation

## 2021-01-19 DIAGNOSIS — D631 Anemia in chronic kidney disease: Secondary | ICD-10-CM | POA: Diagnosis not present

## 2021-01-19 DIAGNOSIS — R7989 Other specified abnormal findings of blood chemistry: Secondary | ICD-10-CM | POA: Diagnosis not present

## 2021-01-19 DIAGNOSIS — E8581 Light chain (AL) amyloidosis: Secondary | ICD-10-CM | POA: Insufficient documentation

## 2021-01-19 DIAGNOSIS — D75839 Thrombocytosis, unspecified: Secondary | ICD-10-CM | POA: Diagnosis not present

## 2021-01-19 DIAGNOSIS — Z7982 Long term (current) use of aspirin: Secondary | ICD-10-CM | POA: Diagnosis not present

## 2021-01-19 DIAGNOSIS — E1165 Type 2 diabetes mellitus with hyperglycemia: Secondary | ICD-10-CM | POA: Insufficient documentation

## 2021-01-19 DIAGNOSIS — R739 Hyperglycemia, unspecified: Secondary | ICD-10-CM

## 2021-01-19 DIAGNOSIS — N1831 Chronic kidney disease, stage 3a: Secondary | ICD-10-CM

## 2021-01-19 DIAGNOSIS — E1122 Type 2 diabetes mellitus with diabetic chronic kidney disease: Secondary | ICD-10-CM | POA: Insufficient documentation

## 2021-01-19 DIAGNOSIS — Z794 Long term (current) use of insulin: Secondary | ICD-10-CM | POA: Insufficient documentation

## 2021-01-19 DIAGNOSIS — Z5111 Encounter for antineoplastic chemotherapy: Secondary | ICD-10-CM | POA: Insufficient documentation

## 2021-01-19 DIAGNOSIS — E859 Amyloidosis, unspecified: Secondary | ICD-10-CM

## 2021-01-19 DIAGNOSIS — N1832 Chronic kidney disease, stage 3b: Secondary | ICD-10-CM | POA: Diagnosis not present

## 2021-01-19 LAB — COMPREHENSIVE METABOLIC PANEL
ALT: 38 U/L (ref 0–44)
AST: 41 U/L (ref 15–41)
Albumin: 2.1 g/dL — ABNORMAL LOW (ref 3.5–5.0)
Alkaline Phosphatase: 808 U/L — ABNORMAL HIGH (ref 38–126)
Anion gap: 9 (ref 5–15)
BUN: 17 mg/dL (ref 8–23)
CO2: 20 mmol/L — ABNORMAL LOW (ref 22–32)
Calcium: 8.2 mg/dL — ABNORMAL LOW (ref 8.9–10.3)
Chloride: 104 mmol/L (ref 98–111)
Creatinine, Ser: 0.87 mg/dL (ref 0.44–1.00)
GFR, Estimated: >60 mL/min (ref >60)
Glucose, Bld: 285 mg/dL — ABNORMAL HIGH (ref 70–99)
Potassium: 3.6 mmol/L (ref 3.5–5.1)
Sodium: 133 mmol/L — ABNORMAL LOW (ref 135–145)
Total Bilirubin: 2.7 mg/dL — ABNORMAL HIGH (ref 0.3–1.2)
Total Protein: 5.2 g/dL — ABNORMAL LOW (ref 6.5–8.1)

## 2021-01-19 LAB — CBC WITH DIFFERENTIAL/PLATELET
Abs Immature Granulocytes: 0.05 10*3/uL (ref 0.00–0.07)
Basophils Absolute: 0 10*3/uL (ref 0.0–0.1)
Basophils Relative: 0 %
Eosinophils Absolute: 0.1 10*3/uL (ref 0.0–0.5)
Eosinophils Relative: 1 %
HCT: 23.9 % — ABNORMAL LOW (ref 36.0–46.0)
Hemoglobin: 8 g/dL — ABNORMAL LOW (ref 12.0–15.0)
Immature Granulocytes: 1 %
Lymphocytes Relative: 14 %
Lymphs Abs: 1 10*3/uL (ref 0.7–4.0)
MCH: 26.7 pg (ref 26.0–34.0)
MCHC: 33.5 g/dL (ref 30.0–36.0)
MCV: 79.7 fL — ABNORMAL LOW (ref 80.0–100.0)
Monocytes Absolute: 0.7 10*3/uL (ref 0.1–1.0)
Monocytes Relative: 10 %
Neutro Abs: 5.5 10*3/uL (ref 1.7–7.7)
Neutrophils Relative %: 74 %
Platelets: 458 10*3/uL — ABNORMAL HIGH (ref 150–400)
RBC: 3 MIL/uL — ABNORMAL LOW (ref 3.87–5.11)
RDW: 23.3 % — ABNORMAL HIGH (ref 11.5–15.5)
WBC: 7.4 10*3/uL (ref 4.0–10.5)
nRBC: 0.3 % — ABNORMAL HIGH (ref 0.0–0.2)

## 2021-01-19 MED ORDER — DARATUMUMAB-HYALURONIDASE-FIHJ 1800-30000 MG-UT/15ML ~~LOC~~ SOLN
1800.0000 mg | Freq: Once | SUBCUTANEOUS | Status: AC
  Administered 2021-01-19: 1800 mg via SUBCUTANEOUS
  Filled 2021-01-19: qty 15

## 2021-01-19 MED ORDER — DEXAMETHASONE 4 MG PO TABS
20.0000 mg | ORAL_TABLET | Freq: Once | ORAL | Status: AC
  Administered 2021-01-19: 20 mg via ORAL
  Filled 2021-01-19: qty 5

## 2021-01-19 MED ORDER — ACETAMINOPHEN 325 MG PO TABS
325.0000 mg | ORAL_TABLET | Freq: Once | ORAL | Status: AC
  Administered 2021-01-19: 325 mg via ORAL
  Filled 2021-01-19: qty 1

## 2021-01-19 MED ORDER — ACYCLOVIR 400 MG PO TABS: 400.0000 mg | ORAL_TABLET | Freq: Two times a day (BID) | ORAL | 5 refills | Status: DC

## 2021-01-19 MED ORDER — FERROUS SULFATE 325 (65 FE) MG PO TABS
325.0000 mg | ORAL_TABLET | Freq: Three times a day (TID) | ORAL | 3 refills | Status: DC
Start: 2021-01-19 — End: 2021-04-29

## 2021-01-19 MED ORDER — DIPHENHYDRAMINE HCL 25 MG PO CAPS
50.0000 mg | ORAL_CAPSULE | Freq: Once | ORAL | Status: AC
  Administered 2021-01-19: 50 mg via ORAL
  Filled 2021-01-19: qty 2

## 2021-01-19 MED ORDER — VITAMIN C 500 MG PO CAPS: 500.0000 ug | ORAL_CAPSULE | Freq: Every day | ORAL | 1 refills | Status: DC

## 2021-01-19 NOTE — Progress Notes (Signed)
Nutrition Follow-up:  Patient with monoclonal gammopathy with AL amyloidosis.  Patient receiving DaraCyBorD.    Met with patient during infusion.  Patient eating baked potato chips.  Says that she has been drinking 2 ensure each day and trying to eat 3 meals per day. Can't remember what she ate for breakfast.  Thinks she ate pork chop and pork n beans for dinner last night.  Says that she has a home health aide and PT/OT coming out to her house.      Medications: reviewed  Labs: reviewed  Anthropometrics:   Weight 135 lb 4.8 oz today  129 lb 6.4 oz on 9/9 142 lb on 5/25   NUTRITION DIAGNOSIS: Unintentional weight loss improving    INTERVENTION:  Continue appetite stimulant Encouraged patient to continue ensure shake BID    MONITORING, EVALUATION, GOAL: weight trends, intake   NEXT VISIT: Thursday, Nov 3rd during infusion  Kila Godina B. Zenia Resides, [place], [place] Registered Dietitian [phone] (mobile)

## 2021-01-19 NOTE — Progress Notes (Unsigned)
Injection site WNL, no swelling, redness or pain noted at site. Pt stable at discharge. No distress noted.

## 2021-01-19 NOTE — Progress Notes (Signed)
[facility]  [phone] [phone]  Patient Care Team: Leonel Ramsay, MD as PCP - General (Infectious Diseases) End, [name], MD as PCP - Cardiology (Cardiology)   Name of the patient: Nancy Rodriguez  [id number]  1945-11-11   Date of visit: 01/19/21  HPI: Patient is a 75 y.o. female with  AL lambda amyloidosis. Currently treated with Revlimid (lenalidomide), daratumumab, and dexamethasone.  Reason for Consult: Oral chemotherapy follow-up for lenalidomide therapy.   PAST MEDICAL HISTORY: Past Medical History:  Diagnosis Date   Anemia    Anemia in chronic kidney disease   Chronic kidney disease    Stage 3b chronic kidney disease   Diabetes mellitus without complication (HCC)    Hypercholesterolemia    Hypertension    MGUS (monoclonal gammopathy of unknown significance)    Osteoarthritis    Rheumatoid arthritis ([place])     HEMATOLOGY/ONCOLOGY HISTORY:  Oncology History   No history exists.    ALLERGIES:  is allergic to benazepril, lisinopril, tolmetin, and nsaids.  MEDICATIONS:  Current Outpatient Medications  Medication Sig Dispense Refill   acyclovir (ZOVIRAX) 400 MG tablet TAKE 1 TABLET BY MOUTH TWICE A DAY 60 tablet 5   albuterol (VENTOLIN HFA) 108 (90 Base) MCG/ACT inhaler Inhale 2 puffs into the lungs every 4 (four) hours as needed.     amLODipine (NORVASC) 5 MG tablet Take 5 mg by mouth daily.     Ascorbic Acid (VITAMIN C) 500 MG CAPS Take 500 mcg by mouth daily. 30 capsule 1   aspirin EC 81 MG tablet Take 1 tablet (81 mg total) by mouth daily. Swallow whole. 30 tablet 0   Continuous Blood Gluc Sensor (FREESTYLE LIBRE 14 DAY SENSOR) MISC SMARTSIG:1 Kit(s) Topical Every 2 Weeks     dexamethasone (DECADRON) 4 MG tablet Take 5 tablets (20 mg total) by mouth once a week. 60 tablet 0   docusate sodium (COLACE) 100 MG capsule Take 1 capsule by mouth daily.     EPINEPHrine 0.3 mg/0.3 mL IJ  SOAJ injection Inject 0.3 mg into the muscle as needed. (Patient not taking: No sig reported)     ferrous sulfate 325 (65 FE) MG tablet Take 1 tablet (325 mg total) by mouth 3 (three) times daily with meals. 90 tablet 3   fluconazole (DIFLUCAN) 150 MG tablet Take 150 mg by mouth daily.     furosemide (LASIX) 20 MG tablet TAKE 2 TABLETS BY MOUTH DAILY. TAKE 1 TABLET DAILY FOR 3 DAYS OR UNTIL SWELLING IMPROVES 60 tablet 3   gabapentin (NEURONTIN) 300 MG capsule Take 300 mg by mouth at bedtime.     hydrOXYzine (ATARAX/VISTARIL) 10 MG tablet Take 10 mg by mouth 2 (two) times daily as needed.     insulin glargine (LANTUS SOLOSTAR) 100 UNIT/ML Solostar Pen Inject 17 Units into the skin 2 (two) times daily. Pt takes in morning and bedtime.     KLOR-CON M20 20 MEQ tablet TAKE 2 TABLETS BY MOUTH TWICE A DAY 120 tablet 0   lenalidomide (REVLIMID) 10 MG capsule Take 1 capsule (10 mg total) by mouth daily. Take for 21 days, then hold for 7 days. Repeat every 28 days. (Patient not taking: Reported on 01/19/2021) 21 capsule 0   LORazepam (ATIVAN) 0.5 MG tablet Take 1 tablet (0.5 mg total) by mouth every 6 (six) hours as needed (nausea vomiting). 30 tablet 0   megestrol (MEGACE) 40 MG/ML suspension TAKE 10 MLS (400 MG TOTAL) BY MOUTH DAILY.  240 mL 0   metoprolol succinate (TOPROL-XL) 50 MG 24 hr tablet TAKE 1 TABLET (50 MG TOTAL) BY MOUTH DAILY IN THE MORNING     mirtazapine (REMERON) 7.5 MG tablet Take 1 tablet (7.5 mg total) by mouth at bedtime. 30 tablet 2   omeprazole (PRILOSEC) 20 MG capsule Take 1 capsule (20 mg total) by mouth daily. 30 capsule 1   ondansetron (ZOFRAN) 8 MG tablet TAKE 8 MG BY MOUTH 30 TO 60 MIN PRIOR TO CYTOXAN ADMINISTRATION THEN TAKE 8 MG TWICE DAILY AS NEEDED FOR NAUSEA AND VOMITING. 30 tablet 1   ondansetron (ZOFRAN) 8 MG tablet Take by mouth. (Patient not taking: No sig reported)     promethazine (PHENERGAN) 25 MG tablet TAKE 1 TABLET BY MOUTH EVERY 8 HOURS AS NEEDED FOR NAUSEA AND  VOMITING 90 tablet 1   sertraline (ZOLOFT) 25 MG tablet Take by mouth. (Patient not taking: No sig reported)     traMADol (ULTRAM) 50 MG tablet Take 1 tablet (50 mg total) by mouth every 6 (six) hours as needed for severe pain. 8 tablet 0   No current facility-administered medications for this visit.    VITAL SIGNS: There were no vitals taken for this visit. There were no vitals filed for this visit.  Estimated body mass index is 23.49 kg/m as calculated from the following:   Height as of 10/13/20: _0  (1.6 m).   Weight as of an earlier encounter on 01/19/21: 60.1 kg (132 lb 9.6 oz).  LABS: CBC:    Component Value Date/Time   WBC 7.4 01/19/2021 0933   HGB 8.0 (L) 01/19/2021 0933   HCT 23.9 (L) 01/19/2021 0933   PLT 458 (H) 01/19/2021 0933   MCV 79.7 (L) 01/19/2021 0933   NEUTROABS 5.5 01/19/2021 0933   LYMPHSABS 1.0 01/19/2021 0933   MONOABS 0.7 01/19/2021 0933   EOSABS 0.1 01/19/2021 0933   BASOSABS 0.0 01/19/2021 0933   Comprehensive Metabolic Panel:    Component Value Date/Time   NA 133 (L) 01/19/2021 0933   K 3.6 01/19/2021 0933   CL 104 01/19/2021 0933   CO2 20 (L) 01/19/2021 0933   BUN 17 01/19/2021 0933   CREATININE 0.87 01/19/2021 0933   GLUCOSE 285 (H) 01/19/2021 0933   CALCIUM 8.2 (L) 01/19/2021 0933   AST 41 01/19/2021 0933   ALT 38 01/19/2021 0933   ALKPHOS 808 (H) 01/19/2021 0933   BILITOT 2.7 (H) 01/19/2021 0933   PROT 5.2 (L) 01/19/2021 0933   ALBUMIN 2.1 (L) 01/19/2021 0933     Present during today's visit: patient and daughter Nancy Rodriguez  Assessment and Plan: Start next cycle of Revlimid today. Revlimid 85m 21 days on/7 days off Reviewed medication calendar with patient and her daughter, suggested that they use the calendar and cross of the medications each day as she takes them to stay on schedule. Ms. CStankusknows it will be normal to run out of Revlimid medication because she has a 7 days break before she needs to resume treatment.   Oral  Chemotherapy Side Effect/Intolerance:  Edema: slight edema, encouraged elevation when sitting down Constipation: no currently a problem, she is taking 2 docusate tablets daily and it is keeping her regular Fatigue: unchanged No reported nausea  Oral Chemotherapy Adherence: based on pill count with last cycle patient appears to have gotten off track with her cycle, With today's start her IV and oral should now be coordinated again.  No additional patient barriers to medication adherence identified.  New medications: none reported  Medication Access Issues: No issues, patient will pick up her Biologics refill from a local access point today  Patient expressed understanding and was in agreement with this plan. She also understands that She can call clinic at any time with any questions, concerns, or complaints.   Follow-up plan: RTC in 1 month  Thank you for allowing me to participate in the care of this very pleasant patient.   Time Total: 15 mins  Visit consisted of counseling and education on dealing with issues of symptom management in the setting of serious and potentially life-threatening illness.Greater than 50%  of this time was spent counseling and coordinating care related to the above assessment and plan.  Signed by: Darl Pikes, PharmD, BCPS, Salley Slaughter, CPP Hematology/Oncology Clinical Pharmacist Practitioner [facility]/HP/[facility] [phone]  01/19/2021 12:40 PM

## 2021-01-19 NOTE — Progress Notes (Signed)
Hematology/Oncology  Follow up note [facility] [phone] [phone]   Patient Care Team: Leonel Ramsay, MD as PCP - General (Infectious Diseases) End, [name], MD as PCP - Cardiology (Cardiology)  REFERRING PROVIDER: Leonel Ramsay, MD  CHIEF COMPLAINTS/REASON FOR VISIT:  Follow-up for amyloidosis  HISTORY OF PRESENTING ILLNESS:   Nancy Rodriguez is a  75 y.o.  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  Work up include protein electrophoresis showed M protein 0.7,   Reviewed her previous medical records via care everywhere.  She was seen by Hematology Oncology at [facility] on 02/14/2017.  07/25/2007  SPEP showed M protein of 0.35, IFE showed IgG lamda 09/22/2007  Bone survey negative.  02/15/14 IgG 930, SPEP M protien 0.22, free kappa light chain 3.59, lamda 2.92, ratio 1.23  Hypercalcemia, resolved after stopping HCTZ.  # Transaminitis 03/28/20 US abdomen showed left liver lobe Complex 1.7 cm cyst  # 04/07/20 MRI abdomen w/wo contrast showed 2 benign liver cysts, 2 nonspecific hypovascular irighr liver lesions, abnormal bone marrow signal of L1 vertebral body. Patient was  advised to proceed with bone marrow biopsy and she declined.  # referred her to established care with GI and was seen on 04/19/20,  # Liver biopsy showed amyloidosis. [name] MS/MS showed AL type # 05/20/20 recommend bone marrow biopsy which is scheduled on 05/24/2020.  Patient changed her mind and ask a biopsy to be canceled.  My team and I had multiple phone discussion with patient's daughter Nancy Rodriguez and later with the patient's son Nancy Rodriguez.  Patient agreed with bone marrow biopsy and a biopsy was scheduled on 06/09/2020.  05/20/20 NT proBNP  was normal,  TSH slightly increased, normal  T4 Normal factor X Normal coags Troponin 19. Refer to cardiology for evaluation. May need cardiac MRI  # 06/01/2020 2D echo result was reviewed and discussed with patient.  Patient has normal LVEF 60-65%. Mild asymmetric left ventricular hypertrophy.  Left ventricular diastolic parameters are indeterminate. average left ventricular global longitudinal strain is -16.8 %.  # 06/09/2020, bone marrow biopsy showed monoclonal plasmacytosis, 8%, amyloid deposit present.  Absent iron stores. 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 t (14;20) #06/17/2020, further discussed about diagnosis and treatment plan.  Patient declined bone marrow transplant evaluation.  Decision was made to proceed with Dara-CyborD chemotherapy treatments. #May 2022, second opinion at [place] with Dr. Tracey Harries.who agrees with current treatment plan.  He recommends to lower dexamethasone to 20 mg and decrease premed Tylenol to 325 mg  09/01/2020 cardiac MRI was performed at [facility].  Left ventricle is normal in size.  Mild to moderate basal septal hypertrophy.  Hyper dynamic LV function.  LVEF 71%.  Right ventricle is normal in size and wall thickness.  Systolic function normal.  Mild bi atrial enlargement, no significant aortic valve stenosis or regurgitation.  Trivial mitral regurgitation, mild tricuspid regurgitation and a trivial pulmonic regurgitation.  No evidence of MI, scarring or infiltration.  #History of major depression listed in outside problem list.  previous diagnosis and treatment details are not available to me in EMR  I have referred her to psychiatrist  #  vaginal bleeding which stopped- she was seen by GYN and recommended for biopsy.   # She has poor IV access  and was not able to receive IV Emend and IV Aloxi. She does not want to have med port.   # GERD   Dr. Ola Spurr has increased her omeprazole to 40 mg daily. She feels symptoms are improved.  # 10/08/2020, cervical  spine showed cervical spondylosis.  Spinal canal diagnosis and a posterior disc osteophyte complex and superimposed small central disc protrusion contact the ventral spinal cord at C4-C5.  Multilevel neuroforaminal narrowing. bilateral atlantooccipital joint effusions, multilevel grade 1 spondylolisthesis. MRI lumbar spine without contrast showed acute or subacute fracture of L4.   Moderate spinal stenosis at L3-4 has progressed. Shallow left foraminal disc protrusion has progressed. Moderate subarticular stenosis on the left also with progression  Moderate spinal stenosis L4-5 with progression. Moderate subarticular stenosis on the left with progression  # 10/25/2020 seen by [place] who recommend patient to switch Dara CyBorD to Dara RD  # 11/03/2020 Revlimid (lenalidomide) 10 mg by mouth daily for 14 days, then hold for 7 days. Pt was started on a shortened cycle in order to remain on similar schedule with infusion.   # 11/22/2020 seen by hepatologist Dr.Kappus Rodman Key. Recommended to start Zoloft 64m daily for pruritis # 11/24/2020 cycle 2 Revlimid 10 mg, she finished 14 days.  Rest of the course was held due to anemia.  Patient declined blood transfusion.    INTERVAL HISTORY Nancy MAZZARELLAis a 75y.o. female who has above history reviewed by me today presents for follow up visit for management of AL amyloidosis Problems and complaints are listed below: Patient continues to have vaginal spotting  patient was seen by Dr. BLeafy Ro  on megace 421mdaily. 12/22/2020 repeat EMBx - not diagnostic due to insufficient squamous component.    Review of Systems  Constitutional:  Positive for fatigue. Negative for appetite change, chills, fever and unexpected weight change.  HENT:   Negative for hearing loss, nosebleeds and voice change.   Eyes:  Positive for icterus. Negative for eye problems.  Respiratory:  Negative for chest tightness and cough.   Cardiovascular:  Negative for chest  pain and leg swelling.  Gastrointestinal:  Negative for abdominal distention, abdominal pain, blood in stool and nausea.  Endocrine: Negative for hot flashes.  Genitourinary:  Negative for difficulty urinating and frequency.   Musculoskeletal:  Positive for back pain. Negative for arthralgias.  Skin:  Negative for rash.       jaundice  Neurological:  Positive for numbness. Negative for extremity weakness.  Hematological:  Negative for adenopathy.  Psychiatric/Behavioral:  Negative for confusion.    MEDICAL HISTORY:  Past Medical History:  Diagnosis Date   Anemia    Anemia in chronic kidney disease   Chronic kidney disease    Stage 3b chronic kidney disease   Diabetes mellitus without complication (HCC)    Hypercholesterolemia    Hypertension    MGUS (monoclonal gammopathy of unknown significance)    Osteoarthritis    Rheumatoid arthritis ([place]    SURGICAL HISTORY: Past Surgical History:  Procedure Laterality Date   HERNIA REPAIR     PARATHYROIDECTOMY      SOCIAL HISTORY: Social History   Socioeconomic History   Marital status: Widowed    Spouse name: Not on file   Number of children: 6   Years of education: Not on file   Highest education level: Not on file  Occupational History   Not on file  Tobacco Use   Smoking status: Never   Smokeless tobacco: Never  Vaping Use   Vaping Use: Never used  Substance and Sexual Activity   Alcohol use: No   Drug use: Never   Sexual activity: Not on file  Other Topics Concern   Not on file  Social History Narrative   Not on file   Social Determinants of Health   Financial Resource Strain: Not on file  Food Insecurity: Not on file  Transportation Needs: Not on file  Physical Activity: Not on file  Stress: Not on file  Social Connections: Not on file  Intimate Partner Violence: Not on file    FAMILY HISTORY: Family History  Problem Relation Age of Onset   Diabetes Daughter    Diabetes Son    Dementia Mother     Cancer Father    Esophageal cancer Sister    Brain cancer Brother     ALLERGIES:  is allergic to benazepril, lisinopril, tolmetin, and nsaids.  MEDICATIONS:  Current Outpatient Medications  Medication Sig Dispense Refill   acyclovir (ZOVIRAX) 400 MG tablet TAKE 1 TABLET BY MOUTH TWICE A DAY 60 tablet 5   albuterol (VENTOLIN HFA) 108 (90 Base) MCG/ACT inhaler Inhale 2 puffs into the lungs every 4 (four) hours as needed.     amLODipine (NORVASC) 5 MG tablet Take 5 mg by mouth daily.     Ascorbic Acid (VITAMIN C) 500 MG CAPS Take 500 mcg by mouth daily. 30 capsule 1   aspirin EC 81 MG tablet Take 1 tablet (81 mg total) by mouth daily. Swallow whole. 30 tablet 0   Continuous Blood Gluc Sensor (FREESTYLE LIBRE 14 DAY SENSOR) MISC SMARTSIG:1 Kit(s) Topical Every 2 Weeks     dexamethasone (DECADRON) 4 MG tablet Take 5 tablets (20 mg total) by mouth once a week. 60 tablet 0   docusate sodium (COLACE) 100 MG capsule Take 1 capsule by mouth daily.     fluconazole (DIFLUCAN) 150 MG tablet Take 150 mg by mouth daily.     furosemide (LASIX) 20 MG tablet TAKE 2 TABLETS BY MOUTH DAILY. TAKE 1 TABLET DAILY FOR 3 DAYS OR UNTIL SWELLING IMPROVES 60 tablet 3   gabapentin (NEURONTIN) 300 MG capsule Take 300 mg by mouth at bedtime.     hydrOXYzine (ATARAX/VISTARIL) 10 MG tablet Take 10 mg by mouth 2 (two) times daily as needed.     insulin glargine (LANTUS SOLOSTAR) 100 UNIT/ML Solostar Pen Inject 17 Units into the skin 2 (two) times daily. Pt takes in morning and bedtime.     KLOR-CON M20 20 MEQ tablet TAKE 2 TABLETS BY MOUTH TWICE A DAY 120 tablet 0   LORazepam (ATIVAN) 0.5 MG tablet Take 1 tablet (0.5 mg total) by mouth every 6 (six) hours as needed (nausea vomiting). 30 tablet 0   megestrol (MEGACE) 40 MG/ML suspension TAKE 10 MLS (400 MG TOTAL) BY MOUTH DAILY. 240 mL 0   metoprolol succinate (TOPROL-XL) 50 MG 24 hr tablet TAKE 1 TABLET (50 MG TOTAL) BY MOUTH DAILY IN THE MORNING     mirtazapine (REMERON)  7.5 MG tablet Take 1 tablet (7.5 mg total) by mouth at bedtime. 30 tablet 2   omeprazole (PRILOSEC) 20 MG capsule Take 1 capsule (20 mg total) by mouth daily. 30 capsule 1   ondansetron (ZOFRAN) 8 MG tablet TAKE 8 MG BY MOUTH 30 TO 60 MIN PRIOR TO CYTOXAN ADMINISTRATION THEN TAKE 8 MG TWICE DAILY AS NEEDED FOR NAUSEA AND VOMITING. 30 tablet 1   promethazine (PHENERGAN) 25 MG tablet TAKE 1 TABLET BY  MOUTH EVERY 8 HOURS AS NEEDED FOR NAUSEA AND VOMITING 90 tablet 1   traMADol (ULTRAM) 50 MG tablet Take 1 tablet (50 mg total) by mouth every 6 (six) hours as needed for severe pain. 8 tablet 0   EPINEPHrine 0.3 mg/0.3 mL IJ SOAJ injection Inject 0.3 mg into the muscle as needed. (Patient not taking: No sig reported)     ferrous sulfate 325 (65 FE) MG tablet Take 1 tablet (325 mg total) by mouth 3 (three) times daily with meals. 90 tablet 3   lenalidomide (REVLIMID) 10 MG capsule Take 1 capsule (10 mg total) by mouth daily. Take for 21 days, then hold for 7 days. Repeat every 28 days. (Patient not taking: Reported on 01/19/2021) 21 capsule 0   ondansetron (ZOFRAN) 8 MG tablet Take by mouth. (Patient not taking: No sig reported)     sertraline (ZOLOFT) 25 MG tablet Take by mouth. (Patient not taking: No sig reported)     No current facility-administered medications for this visit.   Facility-Administered Medications Ordered in Other Visits  Medication Dose Route Frequency Provider Last Rate Last Admin   daratumumab-hyaluronidase-fihj (DARZALEX FASPRO) 1800-30000 MG-UT/15ML chemo SQ injection 1,800 mg  1,800 mg Subcutaneous Once [name], MD         PHYSICAL EXAMINATION: ECOG PERFORMANCE STATUS: 1 - Symptomatic but completely ambulatory Vitals:   01/19/21 1010  BP: 137/71  Pulse: 96  Resp: 18  Temp: 98.8 F (37.1 C)   Filed Weights   01/19/21 1010  Weight: 132 lb 9.6 oz (60.1 kg)    Physical Exam Constitutional:      General: She is not in acute distress. HENT:     Head: Normocephalic  and atraumatic.  Eyes:     General: No scleral icterus. Cardiovascular:     Rate and Rhythm: Normal rate and regular rhythm.     Heart sounds: Murmur heard.  Pulmonary:     Effort: Pulmonary effort is normal. No respiratory distress.     Breath sounds: No wheezing.  Abdominal:     General: Bowel sounds are normal. There is no distension.     Palpations: Abdomen is soft.  Musculoskeletal:        General: No deformity. Normal range of motion.     Cervical back: Normal range of motion and neck supple.     Comments: Bilateral lower extremities trace edema  Skin:    General: Skin is warm and dry.     Findings: No erythema or rash.  Neurological:     Mental Status: She is alert and oriented to person, place, and time. Mental status is at baseline.     Cranial Nerves: No cranial nerve deficit.     Coordination: Coordination normal.  Psychiatric:        Mood and Affect: Mood normal.    LABORATORY DATA:  I have reviewed the data as listed Lab Results  Component Value Date   WBC 7.4 01/19/2021   HGB 8.0 (L) 01/19/2021   HCT 23.9 (L) 01/19/2021   MCV 79.7 (L) 01/19/2021   PLT 458 (H) 01/19/2021   Recent Labs    12/23/20 1020 12/30/20 0839 01/19/21 0933  NA 134* 136 133*  K 3.3* 3.8 3.6  CL 104 104 104  CO2 23 22 20*  GLUCOSE 201* 149* 285*  BUN _0 CREATININE 0.75 0.74 0.87  CALCIUM 8.6* 8.7* 8.2*  GFRNONAA >60 >60 >60  PROT 5.6* 5.9* 5.2*  ALBUMIN 2.2* 2.3* 2.1*  AST 47* 45* 41  ALT 32 32 38  ALKPHOS 877* 850* 808*  BILITOT 3.6* 3.0* 2.7*    Iron/TIBC/Ferritin/ %Sat    Component Value Date/Time   IRON 35 08/31/2020 1037   TIBC 290 08/31/2020 1037   FERRITIN 82 08/31/2020 1037   IRONPCTSAT 12 08/31/2020 1037     08/25/2019, platelet count 491, WBC 7.5, hemoglobin 12 Creatinine 1.58, EGFR 37, calcium 10.8, albumin 4.2 Negative hepatitis B surface antigen, hepatitis B core antibody, Negative hepatitis C 08/05/2019, A1c 11.2   RADIOGRAPHIC STUDIES: I  have personally reviewed the radiological images as listed and agreed with the findings in the report. MM 3D SCREEN BREAST BILATERAL  Result Date: 12/02/2020 CLINICAL DATA:  Screening. EXAM: DIGITAL SCREENING BILATERAL MAMMOGRAM WITH TOMOSYNTHESIS AND CAD TECHNIQUE: Bilateral screening digital craniocaudal and mediolateral oblique mammograms were obtained. Bilateral screening digital breast tomosynthesis was performed. The images were evaluated with computer-aided detection. COMPARISON:  Previous exam(s). ACR Breast Density Category c: The breast tissue is heterogeneously dense, which may obscure small masses. FINDINGS: There are no findings suspicious for malignancy. IMPRESSION: No mammographic evidence of malignancy. A result letter of this screening mammogram will be mailed directly to the patient. RECOMMENDATION: Screening mammogram in one year. (Code:SM-B-01Y) BI-RADS CATEGORY  1: Negative. Electronically Signed   By: Lillia Mountain M.D.   On: 12/02/2020 12:22  US Abdomen Limited RUQ (LIVER/GB)  Result Date: 10/31/2020 CLINICAL DATA:  Hyperbilirubinemia. EXAM: ULTRASOUND ABDOMEN LIMITED RIGHT UPPER QUADRANT COMPARISON:  CT of the abdomen and pelvis without contrast 09/02/2020. Right upper quadrant ultrasound 08/25/2020. FINDINGS: Gallbladder: Large gallstones scratched at large shadowing gallstones noted. These measure up to 9 mm. Gallbladder is contracted. Wall thickness measures 5 mm, likely due to contraction. No sonographic Percell Miller sign is present. Common bile duct: Diameter: 4 mm Liver: Simple cyst the left lobe measures up to 1.4 cm. Hepatic parenchyma within normal limits. Portal vein is patent on color Doppler imaging with normal direction of blood flow towards the liver. Other: Small amount of ascites is noted. IMPRESSION: 1. Cholelithiasis without evidence for acute cholecystitis. 2. Small amount of ascites. 3. Normal sonographic appearance of the liver.  Stable cyst. Electronically Signed   By:  San Morelle M.D.   On: 10/31/2020 09:46       ASSESSMENT & PLAN:  1. Encounter for antineoplastic chemotherapy   2. Light chain (AL) amyloidosis (HCC)   3. 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[place])   4. Hyperbilirubinemia   5. Elevated LFTs   6. Hyperglycemia    # AL-Amyloidosis Liver involvement, possible kidney involvement-on second line treatment with daratumumab Revlimid dexamethasone. Labs are reviewed and discussed with patient. Currently on Daratumuab- Q4weeks, Revlimid D1-21, and Dexamethasone weekly.  Proceed with [place] today. Dex 11m po x 1 today as she did not take it at home.  Patient was instructed to start Revlimid.  Continue aspirin 81 mg daily.-Otc suppley M protein further decrease to 0.1  #Anemia is multifactorial, from CKD, marrow suppression and iron deficiency due to vaginal bleeding Continue oral iron supplementation, we discussed about option of IV venofer. Patient prefers to be continued on oral iron. Recommend to increase ferrous sulfate 3250mTID, + vitamin C 50025mRx sent.  Hemoglobin is at 8  #Elevated LFT, due to amyloidosis liver involvement.  Ultrasound showed small amount of ascites, stable liver cyst. Bilirubin is improved since dc of Velcade.  Total bilirubin is 2.7 today.  # uncontrolled DM Recommend patient to measure morning and  evening blood glucose and call her PCP for recommendation of Lantus. She may need higher dose given the use of Dexamethasone. Check A1c at next visit.   #Thrombocytosis, likely reactive.   We spent sufficient time to discuss many aspect of care, questions were answered to patient's satisfaction.  Lab MD 4 weeks  cc Leonel Ramsay, MD   [name], MD, PhD Hematology Oncology [place] at [facility]  01/19/2021

## 2021-01-19 NOTE — Progress Notes (Signed)
Pt here for follow up. No new concerns voiced.   

## 2021-01-20 ENCOUNTER — Ambulatory Visit: Payer: Medicare Other

## 2021-01-20 ENCOUNTER — Other Ambulatory Visit: Payer: Medicare Other

## 2021-01-20 ENCOUNTER — Ambulatory Visit: Payer: Medicare Other | Admitting: Oncology

## 2021-01-20 LAB — KAPPA/LAMBDA LIGHT CHAINS
Kappa free light chain: 19.4 mg/L (ref 3.3–19.4)
Kappa, lambda light chain ratio: 1.8 — ABNORMAL HIGH (ref 0.26–1.65)
Lambda free light chains: 10.8 mg/L (ref 5.7–26.3)

## 2021-01-23 LAB — MULTIPLE MYELOMA PANEL, SERUM
Albumin SerPl Elph-Mcnc: 2.1 g/dL — ABNORMAL LOW (ref 2.9–4.4)
Albumin/Glob SerPl: 0.9 (ref 0.7–1.7)
Alpha 1: 0.3 g/dL (ref 0.0–0.4)
Alpha2 Glob SerPl Elph-Mcnc: 0.8 g/dL (ref 0.4–1.0)
B-Globulin SerPl Elph-Mcnc: 1 g/dL (ref 0.7–1.3)
Gamma Glob SerPl Elph-Mcnc: 0.4 g/dL (ref 0.4–1.8)
Globulin, Total: 2.5 g/dL (ref 2.2–3.9)
IgA: 114 mg/dL (ref 64–422)
IgG (Immunoglobin G), Serum: 435 mg/dL — ABNORMAL LOW (ref 586–1602)
IgM (Immunoglobulin M), Srm: 43 mg/dL (ref 26–217)
M Protein SerPl Elph-Mcnc: 0.1 g/dL — ABNORMAL HIGH
Total Protein ELP: 4.6 g/dL — ABNORMAL LOW (ref 6.0–8.5)

## 2021-01-31 ENCOUNTER — Encounter: Payer: Self-pay | Admitting: Oncology

## 2021-02-10 ENCOUNTER — Other Ambulatory Visit: Payer: Self-pay

## 2021-02-10 ENCOUNTER — Other Ambulatory Visit: Payer: Self-pay | Admitting: *Deleted

## 2021-02-10 DIAGNOSIS — E8581 Light chain (AL) amyloidosis: Secondary | ICD-10-CM

## 2021-02-10 MED ORDER — LENALIDOMIDE 10 MG PO CAPS: 10.0000 mg | ORAL_CAPSULE | Freq: Every day | ORAL | 0 refills | Status: DC

## 2021-02-13 ENCOUNTER — Encounter: Payer: Self-pay | Admitting: Oncology

## 2021-02-16 ENCOUNTER — Inpatient Hospital Stay: Payer: Medicare Other

## 2021-02-16 ENCOUNTER — Other Ambulatory Visit: Payer: Self-pay

## 2021-02-16 ENCOUNTER — Inpatient Hospital Stay: Payer: Medicare Other | Admitting: Pharmacist

## 2021-02-16 ENCOUNTER — Inpatient Hospital Stay: Payer: Medicare Other | Attending: Oncology

## 2021-02-16 ENCOUNTER — Encounter: Payer: Self-pay | Admitting: Oncology

## 2021-02-16 ENCOUNTER — Other Ambulatory Visit: Payer: Self-pay | Admitting: Oncology

## 2021-02-16 ENCOUNTER — Inpatient Hospital Stay (HOSPITAL_BASED_OUTPATIENT_CLINIC_OR_DEPARTMENT_OTHER): Payer: Medicare Other | Admitting: Oncology

## 2021-02-16 VITALS — BP 119/65 | HR 106 | Temp 97.3°F | Wt 130.5 lb

## 2021-02-16 DIAGNOSIS — E1165 Type 2 diabetes mellitus with hyperglycemia: Secondary | ICD-10-CM | POA: Insufficient documentation

## 2021-02-16 DIAGNOSIS — N1831 Chronic kidney disease, stage 3a: Secondary | ICD-10-CM

## 2021-02-16 DIAGNOSIS — N939 Abnormal uterine and vaginal bleeding, unspecified: Secondary | ICD-10-CM

## 2021-02-16 DIAGNOSIS — E8581 Light chain (AL) amyloidosis: Secondary | ICD-10-CM

## 2021-02-16 DIAGNOSIS — D500 Iron deficiency anemia secondary to blood loss (chronic): Secondary | ICD-10-CM | POA: Diagnosis not present

## 2021-02-16 DIAGNOSIS — E1122 Type 2 diabetes mellitus with diabetic chronic kidney disease: Secondary | ICD-10-CM | POA: Diagnosis not present

## 2021-02-16 DIAGNOSIS — Z79899 Other long term (current) drug therapy: Secondary | ICD-10-CM | POA: Diagnosis not present

## 2021-02-16 DIAGNOSIS — Z5111 Encounter for antineoplastic chemotherapy: Secondary | ICD-10-CM | POA: Insufficient documentation

## 2021-02-16 DIAGNOSIS — D509 Iron deficiency anemia, unspecified: Secondary | ICD-10-CM | POA: Insufficient documentation

## 2021-02-16 DIAGNOSIS — N1832 Chronic kidney disease, stage 3b: Secondary | ICD-10-CM | POA: Insufficient documentation

## 2021-02-16 DIAGNOSIS — R739 Hyperglycemia, unspecified: Secondary | ICD-10-CM

## 2021-02-16 DIAGNOSIS — D631 Anemia in chronic kidney disease: Secondary | ICD-10-CM

## 2021-02-16 DIAGNOSIS — R7989 Other specified abnormal findings of blood chemistry: Secondary | ICD-10-CM

## 2021-02-16 DIAGNOSIS — R634 Abnormal weight loss: Secondary | ICD-10-CM

## 2021-02-16 DIAGNOSIS — D649 Anemia, unspecified: Secondary | ICD-10-CM

## 2021-02-16 LAB — COMPREHENSIVE METABOLIC PANEL
ALT: 29 U/L (ref 0–44)
AST: 35 U/L (ref 15–41)
Albumin: 2.1 g/dL — ABNORMAL LOW (ref 3.5–5.0)
Alkaline Phosphatase: 744 U/L — ABNORMAL HIGH (ref 38–126)
Anion gap: 9 (ref 5–15)
BUN: 21 mg/dL (ref 8–23)
CO2: 23 mmol/L (ref 22–32)
Calcium: 8.3 mg/dL — ABNORMAL LOW (ref 8.9–10.3)
Chloride: 99 mmol/L (ref 98–111)
Creatinine, Ser: 1.07 mg/dL — ABNORMAL HIGH (ref 0.44–1.00)
GFR, Estimated: 54 mL/min — ABNORMAL LOW (ref >60)
Glucose, Bld: 375 mg/dL — ABNORMAL HIGH (ref 70–99)
Potassium: 4.5 mmol/L (ref 3.5–5.1)
Sodium: 131 mmol/L — ABNORMAL LOW (ref 135–145)
Total Bilirubin: 2.4 mg/dL — ABNORMAL HIGH (ref 0.3–1.2)
Total Protein: 5.3 g/dL — ABNORMAL LOW (ref 6.5–8.1)

## 2021-02-16 LAB — CBC WITH DIFFERENTIAL/PLATELET
Abs Immature Granulocytes: 0.06 10*3/uL (ref 0.00–0.07)
Basophils Absolute: 0 10*3/uL (ref 0.0–0.1)
Basophils Relative: 0 %
Eosinophils Absolute: 0 10*3/uL (ref 0.0–0.5)
Eosinophils Relative: 0 %
HCT: 20.7 % — ABNORMAL LOW (ref 36.0–46.0)
Hemoglobin: 6.8 g/dL — ABNORMAL LOW (ref 12.0–15.0)
Immature Granulocytes: 1 %
Lymphocytes Relative: 12 %
Lymphs Abs: 1.1 10*3/uL (ref 0.7–4.0)
MCH: 25 pg — ABNORMAL LOW (ref 26.0–34.0)
MCHC: 32.9 g/dL (ref 30.0–36.0)
MCV: 76.1 fL — ABNORMAL LOW (ref 80.0–100.0)
Monocytes Absolute: 0.4 10*3/uL (ref 0.1–1.0)
Monocytes Relative: 4 %
Neutro Abs: 7.4 10*3/uL (ref 1.7–7.7)
Neutrophils Relative %: 83 %
Platelets: 380 10*3/uL (ref 150–400)
RBC: 2.72 MIL/uL — ABNORMAL LOW (ref 3.87–5.11)
RDW: 21.2 % — ABNORMAL HIGH (ref 11.5–15.5)
WBC: 9 10*3/uL (ref 4.0–10.5)
nRBC: 0.7 % — ABNORMAL HIGH (ref 0.0–0.2)

## 2021-02-16 LAB — IRON AND TIBC
Iron: 23 ug/dL — ABNORMAL LOW (ref 28–170)
Saturation Ratios: 7 % — ABNORMAL LOW (ref 10.4–31.8)
TIBC: 316 ug/dL (ref 250–450)
UIBC: 293 ug/dL

## 2021-02-16 LAB — PREPARE RBC (CROSSMATCH)

## 2021-02-16 LAB — FERRITIN: Ferritin: 28 ng/mL (ref 11–307)

## 2021-02-16 MED ORDER — ACETAMINOPHEN 325 MG PO TABS
650.0000 mg | ORAL_TABLET | Freq: Once | ORAL | Status: AC
  Administered 2021-02-16: 650 mg via ORAL
  Filled 2021-02-16: qty 2

## 2021-02-16 MED ORDER — SODIUM CHLORIDE 0.9% IV SOLUTION
250.0000 mL | Freq: Once | INTRAVENOUS | Status: AC
  Administered 2021-02-16: 250 mL via INTRAVENOUS
  Filled 2021-02-16: qty 250

## 2021-02-16 MED ORDER — DIPHENHYDRAMINE HCL 25 MG PO CAPS
25.0000 mg | ORAL_CAPSULE | Freq: Once | ORAL | Status: AC
  Administered 2021-02-16: 25 mg via ORAL
  Filled 2021-02-16: qty 1

## 2021-02-16 NOTE — Progress Notes (Signed)
Patient not seen today. Treatment is on hold due to hgb level on today's lab check.

## 2021-02-16 NOTE — Progress Notes (Signed)
Hematology/Oncology  Follow up note [facility] Telephone:(336) [phone] Fax:(336) [phone]   Patient Care Team: Nancy Ramsay, MD as PCP - General (Infectious Diseases) End, Nancy Gave, MD as PCP - Cardiology (Cardiology)  REFERRING PROVIDER: Leonel Ramsay, MD  CHIEF COMPLAINTS/REASON FOR VISIT:  Follow-up for amyloidosis  HISTORY OF PRESENTING ILLNESS:   Nancy [facility] is a  75 y.o.  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  Work up include protein electrophoresis showed M protein 0.7,   Reviewed her previous medical records via care everywhere.  She was seen by Hematology Oncology at [facility] on 02/14/2017.  07/25/2007  SPEP showed M protein of 0.35, IFE showed IgG lamda 09/22/2007  Bone survey negative.  02/15/14 IgG 930, SPEP M protien 0.22, free kappa light chain 3.59, lamda 2.92, ratio 1.23  Hypercalcemia, resolved after stopping HCTZ.  # Transaminitis 03/28/20 US abdomen showed left liver lobe Complex 1.7 cm cyst  # 04/07/20 MRI abdomen w/wo contrast showed 2 benign liver cysts, 2 nonspecific hypovascular irighr liver lesions, abnormal bone marrow signal of L1 vertebral body. Patient was  advised to proceed with bone marrow biopsy and she declined.  # referred her to established care with GI and was seen on 04/19/20,  # Liver biopsy showed amyloidosis. [name] MS/MS showed AL type # 05/20/20 recommend bone marrow biopsy which is scheduled on 05/24/2020.  Patient changed her mind and ask a biopsy to be canceled.  My team and I had multiple phone discussion with patient's daughter Nancy [facility] and later with the patient's son Nancy [facility].  Patient agreed with bone marrow biopsy and a biopsy was scheduled on 06/09/2020.  05/20/20 NT proBNP  was normal,  TSH slightly increased, normal  T4 Normal factor X Normal coags Troponin 19. Refer to cardiology for evaluation. May need cardiac MRI  # 06/01/2020 2D echo result was reviewed and discussed with patient.  Patient has normal LVEF 60-65%. Mild asymmetric left ventricular hypertrophy.  Left ventricular diastolic parameters are indeterminate. average left ventricular global longitudinal strain is -16.8 %.  # 06/09/2020, bone marrow biopsy showed monoclonal plasmacytosis, 8%, amyloid deposit present.  Absent iron stores. 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 t (14;20) #06/17/2020, further discussed about diagnosis and treatment plan.  Patient declined bone marrow transplant evaluation.  Decision was made to proceed with Dara-CyborD chemotherapy treatments. #May 2022, second opinion at [facility] with [facility].who agrees with current treatment plan.  He recommends to lower dexamethasone to 20 mg and decrease premed Tylenol to 325 mg  09/01/2020 cardiac MRI was performed at [facility].  Left ventricle is normal in size.  Mild to moderate basal septal hypertrophy.  Hyper dynamic LV function.  LVEF 71%.  Right ventricle is normal in size and wall thickness.  Systolic function normal.  Mild bi atrial enlargement, no significant aortic valve stenosis or regurgitation.  Trivial mitral regurgitation, mild tricuspid regurgitation and a trivial pulmonic regurgitation.  No evidence of MI, scarring or infiltration.  #History of major depression listed in outside problem list.  previous diagnosis and treatment details are not available to me in EMR  I have referred her to psychiatrist  #  vaginal bleeding which stopped- she was seen by Nancy Rodriguez and recommended for biopsy.   # She has poor IV access  and was not able to receive IV Emend and IV Aloxi. She does not want to have med port.   # GERD   Nancy Rodriguez has increased her omeprazole to 40 mg daily. She feels symptoms are improved.  # 10/08/2020, cervical  spine showed cervical spondylosis.  Spinal canal diagnosis and a posterior disc osteophyte complex and superimposed small central disc protrusion contact the ventral spinal cord at C4-C5.  Multilevel neuroforaminal narrowing. bilateral atlantooccipital joint effusions, multilevel grade 1 spondylolisthesis. MRI lumbar spine without contrast showed acute or subacute fracture of L4.   Moderate spinal stenosis at L3-4 has progressed. Shallow left foraminal disc protrusion has progressed. Moderate subarticular stenosis on the left also with progression  Moderate spinal stenosis L4-5 with progression. Moderate subarticular stenosis on the left with progression  # 10/25/2020 seen by Nancy [facility] who recommend patient to switch Dara CyBorD to Dara RD  # 11/03/2020 Revlimid (lenalidomide) 10 mg by mouth daily for 14 days, then hold for 7 days. Pt was started on a shortened cycle in order to remain on similar schedule with infusion.   # 11/22/2020 seen by hepatologist [facility]. Recommended to start Zoloft 70m daily for pruritis # 11/24/2020 cycle 2 Revlimid 10 mg, she finished 14 days.  Rest of the course was held due to anemia.  Patient declined blood transfusion.  # Vaginal bleeding  patient was seen by Nancy Rodriguez  on megace 423mdaily. 12/22/2020 repeat EMBx - not diagnostic due to insufficient squamous component.   INTERVAL HISTORY Nancy Rodriguez ACREYs a [age].o. female who has above history reviewed by me today presents for follow up visit for management of AL amyloidosis Problems and complaints are listed below: Patient continues to have vaginal bleeding.  + fatigue + leg swelling.  no nausea vomiting diarrhea.  Accompanied by her daughter. Patient's son was called during this this encounter.   Review of Systems  Constitutional:  Positive for fatigue. Negative for appetite change, chills, fever and unexpected weight change.  HENT:   Negative for hearing loss, nosebleeds and voice  change.   Eyes:  Positive for icterus. Negative for eye problems.  Respiratory:  Negative for chest tightness and cough.   Cardiovascular:  Positive for leg swelling. Negative for chest pain.  Gastrointestinal:  Negative for abdominal distention, abdominal pain, blood in stool and nausea.  Endocrine: Negative for hot flashes.  Genitourinary:  Negative for difficulty urinating and frequency.   Musculoskeletal:  Positive for back pain. Negative for arthralgias.  Skin:  Negative for rash.       jaundice  Neurological:  Positive for numbness. Negative for extremity weakness.  Hematological:  Negative for adenopathy.  Psychiatric/Behavioral:  Negative for confusion.    MEDICAL HISTORY:  Past Medical History:  Diagnosis Date   Anemia    Anemia in chronic kidney disease   Chronic kidney disease    Stage 3b chronic kidney disease   Diabetes mellitus without complication (HCC)    Hypercholesterolemia    Hypertension    MGUS (monoclonal gammopathy of unknown significance)    Osteoarthritis    Rheumatoid arthritis ([place]    SURGICAL HISTORY: Past Surgical History:  Procedure Laterality Date   HERNIA REPAIR     PARATHYROIDECTOMY      SOCIAL HISTORY: Social History   Socioeconomic History   Marital status: Widowed    Spouse name: Not on file   Number of children: 6   Years of education: Not on file   Highest  education level: Not on file  Occupational History   Not on file  Tobacco Use   Smoking status: Never   Smokeless tobacco: Never  Vaping Use   Vaping Use: Never used  Substance and Sexual Activity   Alcohol use: No   Drug use: Never   Sexual activity: Not on file  Other Topics Concern   Not on file  Social History Narrative   Not on file   Social Determinants of Health   Financial Resource Strain: Not on file  Food Insecurity: Not on file  Transportation Needs: Not on file  Physical Activity: Not on file  Stress: Not on file  Social Connections: Not on file   Intimate Partner Violence: Not on file    FAMILY HISTORY: Family History  Problem Relation Age of Onset   Diabetes Daughter    Diabetes Son    Dementia Mother    Cancer Father    Esophageal cancer Sister    Brain cancer Brother     ALLERGIES:  is allergic to benazepril, lisinopril, tolmetin, and nsaids.  MEDICATIONS:  Current Outpatient Medications  Medication Sig Dispense Refill   acyclovir (ZOVIRAX) 400 MG tablet Take 1 tablet (400 mg total) by mouth 2 (two) times daily. 60 tablet 5   albuterol (VENTOLIN HFA) 108 (90 Base) MCG/ACT inhaler Inhale 2 puffs into the lungs every 4 (four) hours as needed.     Ascorbic Acid (VITAMIN C) 500 MG CAPS Take 500 mcg by mouth daily. 30 capsule 1   aspirin EC 81 MG tablet Take 1 tablet (81 mg total) by mouth daily. Swallow whole. 30 tablet 0   Continuous Blood Gluc Sensor (FREESTYLE LIBRE 14 DAY SENSOR) MISC SMARTSIG:1 Kit(s) Topical Every 2 Weeks     dexamethasone (DECADRON) 4 MG tablet Take 5 tablets (20 mg total) by mouth once a week. 60 tablet 0   fluconazole (DIFLUCAN) 150 MG tablet Take 150 mg by mouth daily.     furosemide (LASIX) 20 MG tablet TAKE 2 TABLETS BY MOUTH DAILY. TAKE 1 TABLET DAILY FOR 3 DAYS OR UNTIL SWELLING IMPROVES 60 tablet 3   gabapentin (NEURONTIN) 300 MG capsule Take 300 mg by mouth at bedtime.     hydrOXYzine (ATARAX/VISTARIL) 10 MG tablet Take 10 mg by mouth 2 (two) times daily as needed.     insulin glargine (LANTUS SOLOSTAR) 100 UNIT/ML Solostar Pen Inject 17 Units into the skin 2 (two) times daily. Pt takes in morning and bedtime.     KLOR-CON M20 20 MEQ tablet TAKE 2 TABLETS BY MOUTH TWICE A DAY 120 tablet 0   lenalidomide (REVLIMID) 10 MG capsule Take 1 capsule (10 mg total) by mouth daily. Take for 21 days, then hold for 7 days. Repeat every 28 days. 21 capsule 0   LORazepam (ATIVAN) 0.5 MG tablet Take 1 tablet (0.5 mg total) by mouth every 6 (six) hours as needed (nausea vomiting). 30 tablet 0   megestrol  (MEGACE) 40 MG/ML suspension TAKE 10 MLS (400 MG TOTAL) BY MOUTH DAILY. 240 mL 0   metoprolol succinate (TOPROL-XL) 50 MG 24 hr tablet TAKE 1 TABLET (50 MG TOTAL) BY MOUTH DAILY IN THE MORNING     mirtazapine (REMERON) 7.5 MG tablet Take 1 tablet (7.5 mg total) by mouth at bedtime. 30 tablet 2   omeprazole (PRILOSEC) 20 MG capsule Take 1 capsule (20 mg total) by mouth daily. 30 capsule 1   ondansetron (ZOFRAN) 8 MG tablet TAKE 8 MG BY MOUTH 30 TO  60 MIN PRIOR TO CYTOXAN ADMINISTRATION THEN TAKE 8 MG TWICE DAILY AS NEEDED FOR NAUSEA AND VOMITING. 30 tablet 1   promethazine (PHENERGAN) 25 MG tablet TAKE 1 TABLET BY MOUTH EVERY 8 HOURS AS NEEDED FOR NAUSEA AND VOMITING 90 tablet 1   traMADol (ULTRAM) 50 MG tablet Take 1 tablet (50 mg total) by mouth every 6 (six) hours as needed for severe pain. 8 tablet 0   amLODipine (NORVASC) 5 MG tablet Take 5 mg by mouth daily.     docusate sodium (COLACE) 100 MG capsule Take 1 capsule by mouth daily.     EPINEPHrine 0.3 mg/0.3 mL IJ SOAJ injection Inject 0.3 mg into the muscle as needed. (Patient not taking: No sig reported)     ferrous sulfate 325 (65 FE) MG tablet Take 1 tablet (325 mg total) by mouth 3 (three) times daily with meals. (Patient not taking: Reported on 02/16/2021) 90 tablet 3   ondansetron (ZOFRAN) 8 MG tablet Take by mouth. (Patient not taking: No sig reported)     sertraline (ZOLOFT) 25 MG tablet Take by mouth. (Patient not taking: No sig reported)     No current facility-administered medications for this visit.     PHYSICAL EXAMINATION: ECOG PERFORMANCE STATUS: 1 - Symptomatic but completely ambulatory Vitals:   02/16/21 0934  BP: 119/65  Pulse: (!) 106  Temp: (!) 97.3 F (36.3 C)   Filed Weights   02/16/21 0934  Weight: 130 lb 8 oz (59.2 kg)    Physical Exam Constitutional:      General: She is not in acute distress. HENT:     Head: Normocephalic and atraumatic.  Eyes:     General: No scleral icterus. Cardiovascular:      Rate and Rhythm: Normal rate and regular rhythm.     Heart sounds: Murmur heard.  Pulmonary:     Effort: Pulmonary effort is normal. No respiratory distress.     Breath sounds: No wheezing.  Abdominal:     General: Bowel sounds are normal. There is no distension.     Palpations: Abdomen is soft.  Musculoskeletal:        General: No deformity. Normal range of motion.     Cervical back: Normal range of motion and neck supple.     Comments: Bilateral lower extremities +1 edema  Skin:    General: Skin is warm and dry.     Findings: No erythema or rash.  Neurological:     Mental Status: She is alert and oriented to person, place, and time. Mental status is at baseline.     Cranial Nerves: No cranial nerve deficit.     Coordination: Coordination normal.  Psychiatric:        Mood and Affect: Mood normal.    LABORATORY DATA:  I have reviewed the data as listed Lab Results  Component Value Date   WBC 9.0 02/16/2021   HGB 6.8 (L) 02/16/2021   HCT 20.7 (L) 02/16/2021   MCV 76.1 (L) 02/16/2021   PLT 380 02/16/2021   Recent Labs    12/30/20 0839 01/19/21 0933 02/16/21 0911  NA 136 133* 131*  K 3.8 3.6 4.5  CL 104 104 99  CO2 22 20* 23  GLUCOSE 149* 285* 375*  BUN _0 CREATININE 0.74 0.87 1.07*  CALCIUM 8.7* 8.2* 8.3*  GFRNONAA >60 >60 54*  PROT 5.9* 5.2* 5.3*  ALBUMIN 2.3* 2.1* 2.1*  AST 45* 41 35  ALT 32 38 29  ALKPHOS 850*  808* 744*  BILITOT 3.0* 2.7* 2.4*    Iron/TIBC/Ferritin/ %Sat    Component Value Date/Time   IRON 35 08/31/2020 1037   TIBC 290 08/31/2020 1037   FERRITIN 82 08/31/2020 1037   IRONPCTSAT 12 08/31/2020 1037     08/25/2019, platelet count 491, WBC 7.5, hemoglobin 12 Creatinine 1.58, EGFR 37, calcium 10.8, albumin 4.2 Negative hepatitis B surface antigen, hepatitis B core antibody, Negative hepatitis C 08/05/2019, A1c 11.2   RADIOGRAPHIC STUDIES: I have personally reviewed the radiological images as listed and agreed with the findings  in the report. MM 3D SCREEN BREAST BILATERAL  Result Date: 12/02/2020 CLINICAL DATA:  Screening. EXAM: DIGITAL SCREENING BILATERAL MAMMOGRAM WITH TOMOSYNTHESIS AND CAD TECHNIQUE: Bilateral screening digital craniocaudal and mediolateral oblique mammograms were obtained. Bilateral screening digital breast tomosynthesis was performed. The images were evaluated with computer-aided detection. COMPARISON:  Previous exam(s). ACR Breast Density Category c: The breast tissue is heterogeneously dense, which may obscure small masses. FINDINGS: There are no findings suspicious for malignancy. IMPRESSION: No mammographic evidence of malignancy. A result letter of this screening mammogram will be mailed directly to the patient. RECOMMENDATION: Screening mammogram in one year. (Code:SM-B-01Y) BI-RADS CATEGORY  1: Negative. Electronically Signed   By: Lillia Mountain M.D.   On: 12/02/2020 12:22      ASSESSMENT & PLAN:  1. Encounter for antineoplastic chemotherapy   2. Light chain (AL) amyloidosis (HCC)   3. 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[place])   4. Hyperbilirubinemia   5. Elevated LFTs   6. Hyperglycemia   7. Vaginal bleeding     # AL-Amyloidosis Liver involvement, possible kidney involvement-on second line treatment with daratumumab Revlimid dexamethasone. Currently on Daratumuab- Q4weeks, Revlimid D1-21, and Dexamethasone weekly.  Labs are reviewed and discussed with patient. Hold chemotherapy including Daratumumab and Revlimid due to anemia.   # Symptomatic anemia, Hb 6.8 Multifactorial, combination of chemo and ongoing vaginal bleeding., CKD Transfuse 1 unit of PRBC today.-rationale and side effects of blood products were discussed in detailed. Patient and family member agree with the plan.   Check iron panel.  Plan IV iron with Venofer 258m weekly x 2 doses. Allergy reactions/infusion reaction including anaphylactic reaction discussed with patient. Other side effects include but not  limited to high blood pressure, skin rash, weight gain, leg swelling, etc. Patient and her son and daughter voice understanding and willing to proceed.    #Elevated LFT, due to amyloidosis liver involvement.  Ultrasound showed small amount of ascites, stable liver cyst. Bilirubin is improved since dc of Velcade. Improving  Total bilirubin is 2.4 today.  # uncontrolled DM, weekly Dexamethasone use Recommend patient to measure morning and evening blood glucose and call her PCP for recommendation of Lantus.   # vaginal bleeding. ? Involvedness of amyloidosis vs other etiologies.  On Megace 430mfor bleeding control. Encourage her to follow up closely with NancyBeasely.    We spent sufficient time to discuss many aspect of care, questions were answered to patient's satisfaction.  Lab MD 2 weeks  cc FiLeonel RamsayMD   ZhEarlie ServerMD, PhD Hematology Oncology [facility]11/06/2020

## 2021-02-16 NOTE — Progress Notes (Signed)
Nutrition  RD was not able to see patient during infusion today.  Will plan to follow up on 11/17, next infusion.  Weslynn Ke B. Zenia Resides, [place], [place] Registered Dietitian [phone] (mobile)

## 2021-02-16 NOTE — Patient Instructions (Signed)
Blood Transfusion, Adult, Care After This sheet gives you information about how to care for yourself after your procedure. Your doctor may also give you more specific instructions. If you have problems or questions, contact your doctor. What can I expect after the procedure? After the procedure, it is common to have: Bruising and soreness at the IV site. A headache. Follow these instructions at home: Insertion site care   Follow instructions from your doctor about how to take care of your insertion site. This is where an IV tube was put into your vein. Make sure you: Wash your hands with soap and water before and after you change your bandage (dressing). If you cannot use soap and water, use hand sanitizer. Change your bandage as told by your doctor. Check your insertion site every day for signs of infection. Check for: Redness, swelling, or pain. Bleeding from the site. Warmth. Pus or a bad smell. General instructions Take over-the-counter and prescription medicines only as told by your doctor. Rest as told by your doctor. Go back to your normal activities as told by your doctor. Keep all follow-up visits as told by your doctor. This is important. Contact a doctor if: You have itching or red, swollen areas of skin (hives). You feel worried or nervous (anxious). You feel weak after doing your normal activities. You have redness, swelling, warmth, or pain around the insertion site. You have blood coming from the insertion site, and the blood does not stop with pressure. You have pus or a bad smell coming from the insertion site. Get help right away if: You have signs of a serious reaction. This may be coming from an allergy or the body's defense system (immune system). Signs include: Trouble breathing or shortness of breath. Swelling of the face or feeling warm (flushed). Fever or chills. Head, chest, or back pain. Dark pee (urine) or blood in the pee. Widespread rash. Fast  heartbeat. Feeling dizzy or light-headed. You may receive your blood transfusion in an outpatient setting. If so, you will be told whom to contact to report any reactions. These symptoms may be an emergency. Do not wait to see if the symptoms will go away. Get medical help right away. Call your local emergency services (911 in the [place] Do not drive yourself to the hospital. Summary Bruising and soreness at the IV site are common. Check your insertion site every day for signs of infection. Rest as told by your doctor. Go back to your normal activities as told by your doctor. Get help right away if you have signs of a serious reaction. This information is not intended to replace advice given to you by your health care provider. Make sure you discuss any questions you have with your health care provider. Document Revised: 07/28/2020 Document Reviewed: 09/25/2018 Elsevier Patient Education  2022 [employer] Inc[employer]  

## 2021-02-17 LAB — KAPPA/LAMBDA LIGHT CHAINS
Kappa free light chain: 17.1 mg/L (ref 3.3–19.4)
Kappa, lambda light chain ratio: 1.44 (ref 0.26–1.65)
Lambda free light chains: 11.9 mg/L (ref 5.7–26.3)

## 2021-02-17 LAB — BETA 2 MICROGLOBULIN, SERUM: Beta-2 Microglobulin: 3.1 mg/L — ABNORMAL HIGH (ref 0.6–2.4)

## 2021-02-20 ENCOUNTER — Telehealth: Payer: Self-pay | Admitting: *Deleted

## 2021-02-20 NOTE — Telephone Encounter (Signed)
Patient's daughter called to clarify medication instructions from last visit and to ask what kind of Iron her mother is or will be receiving.

## 2021-02-20 NOTE — Telephone Encounter (Signed)
Returned call to daughter with scheduling information and information about venofer.

## 2021-02-21 LAB — MULTIPLE MYELOMA PANEL, SERUM
Albumin SerPl Elph-Mcnc: 2 g/dL — ABNORMAL LOW (ref 2.9–4.4)
Albumin/Glob SerPl: 0.9 (ref 0.7–1.7)
Alpha 1: 0.3 g/dL (ref 0.0–0.4)
Alpha2 Glob SerPl Elph-Mcnc: 0.8 g/dL (ref 0.4–1.0)
B-Globulin SerPl Elph-Mcnc: 1 g/dL (ref 0.7–1.3)
Gamma Glob SerPl Elph-Mcnc: 0.3 g/dL — ABNORMAL LOW (ref 0.4–1.8)
Globulin, Total: 2.5 g/dL (ref 2.2–3.9)
IgA: 123 mg/dL (ref 64–422)
IgG (Immunoglobin G), Serum: 419 mg/dL — ABNORMAL LOW (ref 586–1602)
IgM (Immunoglobulin M), Srm: 50 mg/dL (ref 26–217)
M Protein SerPl Elph-Mcnc: 0.1 g/dL — ABNORMAL HIGH
Total Protein ELP: 4.5 g/dL — ABNORMAL LOW (ref 6.0–8.5)

## 2021-02-21 LAB — TYPE AND SCREEN
ABO/RH(D): A POS
Antibody Screen: POSITIVE
Unit division: 0

## 2021-02-21 LAB — BPAM RBC
Blood Product Expiration Date: 202211132359
ISSUE DATE / TIME: 202211031527
Unit Type and Rh: 600

## 2021-02-21 LAB — SAMPLE TO BLOOD BANK

## 2021-02-23 ENCOUNTER — Other Ambulatory Visit: Payer: Self-pay

## 2021-02-23 ENCOUNTER — Inpatient Hospital Stay: Payer: Medicare Other

## 2021-02-23 ENCOUNTER — Telehealth: Payer: Self-pay | Admitting: Oncology

## 2021-02-23 VITALS — BP 165/83 | HR 108 | Temp 99.1°F | Resp 20

## 2021-02-23 DIAGNOSIS — Z5111 Encounter for antineoplastic chemotherapy: Secondary | ICD-10-CM | POA: Diagnosis not present

## 2021-02-23 DIAGNOSIS — D500 Iron deficiency anemia secondary to blood loss (chronic): Secondary | ICD-10-CM

## 2021-02-23 MED ORDER — SODIUM CHLORIDE 0.9 % IV SOLN: 200.0000 mg | Freq: Once | INTRAVENOUS | Status: DC

## 2021-02-23 MED ORDER — SODIUM CHLORIDE 0.9 % IV SOLN
Freq: Once | INTRAVENOUS | Status: AC
  Filled 2021-02-23: qty 250

## 2021-02-23 MED ORDER — IRON SUCROSE 20 MG/ML IV SOLN
200.0000 mg | Freq: Once | INTRAVENOUS | Status: AC
  Administered 2021-02-23: 200 mg via INTRAVENOUS
  Filled 2021-02-23: qty 10

## 2021-02-23 NOTE — Patient Instructions (Signed)

## 2021-03-02 ENCOUNTER — Inpatient Hospital Stay: Payer: Medicare Other

## 2021-03-02 ENCOUNTER — Other Ambulatory Visit: Payer: Self-pay

## 2021-03-02 ENCOUNTER — Encounter: Payer: Self-pay | Admitting: Oncology

## 2021-03-02 ENCOUNTER — Inpatient Hospital Stay (HOSPITAL_BASED_OUTPATIENT_CLINIC_OR_DEPARTMENT_OTHER): Payer: Medicare Other | Admitting: Oncology

## 2021-03-02 VITALS — BP 144/75 | HR 90 | Temp 97.8°F | Wt 139.9 lb

## 2021-03-02 DIAGNOSIS — D631 Anemia in chronic kidney disease: Secondary | ICD-10-CM

## 2021-03-02 DIAGNOSIS — D649 Anemia, unspecified: Secondary | ICD-10-CM

## 2021-03-02 DIAGNOSIS — R739 Hyperglycemia, unspecified: Secondary | ICD-10-CM

## 2021-03-02 DIAGNOSIS — Z5111 Encounter for antineoplastic chemotherapy: Secondary | ICD-10-CM | POA: Diagnosis not present

## 2021-03-02 DIAGNOSIS — E859 Amyloidosis, unspecified: Secondary | ICD-10-CM

## 2021-03-02 DIAGNOSIS — N939 Abnormal uterine and vaginal bleeding, unspecified: Secondary | ICD-10-CM

## 2021-03-02 DIAGNOSIS — R140 Abdominal distension (gaseous): Secondary | ICD-10-CM | POA: Diagnosis not present

## 2021-03-02 DIAGNOSIS — D500 Iron deficiency anemia secondary to blood loss (chronic): Secondary | ICD-10-CM

## 2021-03-02 DIAGNOSIS — E8581 Light chain (AL) amyloidosis: Secondary | ICD-10-CM

## 2021-03-02 DIAGNOSIS — R7989 Other specified abnormal findings of blood chemistry: Secondary | ICD-10-CM

## 2021-03-02 DIAGNOSIS — N1831 Chronic kidney disease, stage 3a: Secondary | ICD-10-CM | POA: Diagnosis not present

## 2021-03-02 LAB — COMPREHENSIVE METABOLIC PANEL
ALT: 33 U/L (ref 0–44)
AST: 33 U/L (ref 15–41)
Albumin: 2.2 g/dL — ABNORMAL LOW (ref 3.5–5.0)
Alkaline Phosphatase: 716 U/L — ABNORMAL HIGH (ref 38–126)
Anion gap: 8 (ref 5–15)
BUN: 24 mg/dL — ABNORMAL HIGH (ref 8–23)
CO2: 16 mmol/L — ABNORMAL LOW (ref 22–32)
Calcium: 8.3 mg/dL — ABNORMAL LOW (ref 8.9–10.3)
Chloride: 110 mmol/L (ref 98–111)
Creatinine, Ser: 0.96 mg/dL (ref 0.44–1.00)
GFR, Estimated: >60 mL/min (ref >60)
Glucose, Bld: 377 mg/dL — ABNORMAL HIGH (ref 70–99)
Potassium: 4.1 mmol/L (ref 3.5–5.1)
Sodium: 134 mmol/L — ABNORMAL LOW (ref 135–145)
Total Bilirubin: 2.2 mg/dL — ABNORMAL HIGH (ref 0.3–1.2)
Total Protein: 5.4 g/dL — ABNORMAL LOW (ref 6.5–8.1)

## 2021-03-02 LAB — CBC WITH DIFFERENTIAL/PLATELET
Abs Immature Granulocytes: 0.07 10*3/uL (ref 0.00–0.07)
Basophils Absolute: 0 10*3/uL (ref 0.0–0.1)
Basophils Relative: 0 %
Eosinophils Absolute: 0.1 10*3/uL (ref 0.0–0.5)
Eosinophils Relative: 1 %
HCT: 23.3 % — ABNORMAL LOW (ref 36.0–46.0)
Hemoglobin: 7.7 g/dL — ABNORMAL LOW (ref 12.0–15.0)
Immature Granulocytes: 1 %
Lymphocytes Relative: 16 %
Lymphs Abs: 1.4 10*3/uL (ref 0.7–4.0)
MCH: 25.8 pg — ABNORMAL LOW (ref 26.0–34.0)
MCHC: 33 g/dL (ref 30.0–36.0)
MCV: 77.9 fL — ABNORMAL LOW (ref 80.0–100.0)
Monocytes Absolute: 0.5 10*3/uL (ref 0.1–1.0)
Monocytes Relative: 6 %
Neutro Abs: 6.3 10*3/uL (ref 1.7–7.7)
Neutrophils Relative %: 76 %
Platelets: 415 10*3/uL — ABNORMAL HIGH (ref 150–400)
RBC: 2.99 MIL/uL — ABNORMAL LOW (ref 3.87–5.11)
RDW: 25.3 % — ABNORMAL HIGH (ref 11.5–15.5)
WBC: 8.3 10*3/uL (ref 4.0–10.5)
nRBC: 0.8 % — ABNORMAL HIGH (ref 0.0–0.2)

## 2021-03-02 LAB — TYPE AND SCREEN
ABO/RH(D): A POS
Antibody Screen: POSITIVE

## 2021-03-02 MED ORDER — ALTEPLASE 2 MG IJ SOLR
2.0000 mg | Freq: Once | INTRAMUSCULAR | Status: DC | PRN
  Filled 2021-03-02: qty 2

## 2021-03-02 MED ORDER — HEPARIN SOD (PORK) LOCK FLUSH 100 UNIT/ML IV SOLN
250.0000 [IU] | Freq: Once | INTRAVENOUS | Status: DC | PRN
  Filled 2021-03-02: qty 5

## 2021-03-02 MED ORDER — SODIUM CHLORIDE 0.9% FLUSH
10.0000 mL | INTRAVENOUS | Status: DC | PRN
  Filled 2021-03-02: qty 10

## 2021-03-02 MED ORDER — SODIUM CHLORIDE 0.9 % IV SOLN
Freq: Once | INTRAVENOUS | Status: AC
  Filled 2021-03-02: qty 250

## 2021-03-02 MED ORDER — SODIUM CHLORIDE 0.9 % IV SOLN: 200.0000 mg | Freq: Once | INTRAVENOUS | Status: DC

## 2021-03-02 MED ORDER — HEPARIN SOD (PORK) LOCK FLUSH 100 UNIT/ML IV SOLN
500.0000 [IU] | Freq: Every day | INTRAVENOUS | Status: DC | PRN
  Filled 2021-03-02: qty 5

## 2021-03-02 MED ORDER — ACETAMINOPHEN 325 MG PO TABS
325.0000 mg | ORAL_TABLET | Freq: Once | ORAL | Status: AC
  Administered 2021-03-02: 10:00:00 325 mg via ORAL
  Filled 2021-03-02: qty 1

## 2021-03-02 MED ORDER — HEPARIN SOD (PORK) LOCK FLUSH 100 UNIT/ML IV SOLN
500.0000 [IU] | Freq: Once | INTRAVENOUS | Status: DC | PRN
  Filled 2021-03-02: qty 5

## 2021-03-02 MED ORDER — DIPHENHYDRAMINE HCL 25 MG PO CAPS
50.0000 mg | ORAL_CAPSULE | Freq: Once | ORAL | Status: AC
  Administered 2021-03-02: 10:00:00 50 mg via ORAL
  Filled 2021-03-02: qty 2

## 2021-03-02 MED ORDER — DARATUMUMAB-HYALURONIDASE-FIHJ 1800-30000 MG-UT/15ML ~~LOC~~ SOLN
1800.0000 mg | Freq: Once | SUBCUTANEOUS | Status: AC
  Administered 2021-03-02: 11:00:00 1800 mg via SUBCUTANEOUS
  Filled 2021-03-02: qty 15

## 2021-03-02 MED ORDER — HEPARIN SOD (PORK) LOCK FLUSH 100 UNIT/ML IV SOLN
250.0000 [IU] | INTRAVENOUS | Status: DC | PRN
  Filled 2021-03-02: qty 5

## 2021-03-02 MED ORDER — IRON SUCROSE 20 MG/ML IV SOLN
200.0000 mg | Freq: Once | INTRAVENOUS | Status: AC
  Administered 2021-03-02: 10:00:00 200 mg via INTRAVENOUS
  Filled 2021-03-02: qty 10

## 2021-03-02 MED ORDER — DEXAMETHASONE 4 MG PO TABS
20.0000 mg | ORAL_TABLET | Freq: Once | ORAL | Status: AC
  Administered 2021-03-02: 10:00:00 20 mg via ORAL
  Filled 2021-03-02: qty 5

## 2021-03-02 MED ORDER — SODIUM CHLORIDE 0.9% FLUSH
10.0000 mL | Freq: Once | INTRAVENOUS | Status: DC | PRN
  Filled 2021-03-02: qty 10

## 2021-03-02 MED ORDER — SODIUM CHLORIDE 0.9% FLUSH
3.0000 mL | Freq: Once | INTRAVENOUS | Status: DC | PRN
  Filled 2021-03-02: qty 3

## 2021-03-02 MED ORDER — SODIUM CHLORIDE 0.9% FLUSH
3.0000 mL | INTRAVENOUS | Status: DC | PRN
  Filled 2021-03-02: qty 3

## 2021-03-02 NOTE — Progress Notes (Signed)
Hematology/Oncology  Follow up note [phone] [phone]   Patient Care Team: Leonel Ramsay, MD as PCP - General (Infectious Diseases) End, [name], MD as PCP - Cardiology (Cardiology)  REFERRING PROVIDER: Leonel Ramsay, MD  CHIEF COMPLAINTS/REASON FOR VISIT:  Follow-up for amyloidosis  HISTORY OF PRESENTING ILLNESS:   Nancy Rodriguez is a  75 y.o.  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  Work up include protein electrophoresis showed M protein 0.7,   Reviewed her previous medical records via care everywhere.  She was seen by Hematology Oncology at [facility] on 02/14/2017.  07/25/2007  SPEP showed M protein of 0.35, IFE showed IgG lamda 09/22/2007  Bone survey negative.  02/15/14 IgG 930, SPEP M protien 0.22, free kappa light chain 3.59, lamda 2.92, ratio 1.23  Hypercalcemia, resolved after stopping HCTZ.  # Transaminitis 03/28/20 US abdomen showed left liver lobe Complex 1.7 cm cyst  # 04/07/20 MRI abdomen w/wo contrast showed 2 benign liver cysts, 2 nonspecific hypovascular irighr liver lesions, abnormal bone marrow signal of L1 vertebral body. Patient was  advised to proceed with bone marrow biopsy and she declined.  # referred her to established care with GI and was seen on 04/19/20,  # Liver biopsy showed amyloidosis. [place] MS/MS showed AL type # 05/20/20 recommend bone marrow biopsy which is scheduled on 05/24/2020.  Patient changed her mind and ask a biopsy to be canceled.  My team and I had multiple phone discussion with patient's daughter [name] and later with [name].  Patient agreed with bone marrow biopsy and a biopsy was scheduled on 06/09/2020.  05/20/20 NT proBNP  was normal,  TSH slightly increased, normal T4 Normal factor X Normal  coags Troponin 19. Refer to cardiology for evaluation. May need cardiac MRI  # 06/01/2020 2D echo result was reviewed and discussed with patient.  Patient has normal LVEF 60-65%. Mild asymmetric left ventricular hypertrophy.  Left ventricular diastolic parameters are indeterminate. average left ventricular global longitudinal strain is -16.8 %.  # 06/09/2020, bone marrow biopsy showed monoclonal plasmacytosis, 8%, amyloid deposit present.  Absent iron stores. 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 t (14;20) #06/17/2020, further discussed about diagnosis and treatment plan.  Patient declined bone marrow transplant evaluation.  Decision was made to proceed with Dara-CyborD chemotherapy treatments. #May 2022, second opinion at [facility] with Dr. Tracey Harries.who agrees with current treatment plan.  He recommends to lower dexamethasone to 20 mg and decrease premed Tylenol to 325 mg  09/01/2020 cardiac MRI was performed at [facility].  Left ventricle is normal in size.  Mild to moderate basal septal hypertrophy.  Hyper dynamic LV function.  LVEF 71%.  Right ventricle is normal in size and wall thickness.  Systolic function normal.  Mild bi atrial enlargement, no significant aortic valve stenosis or regurgitation.  Trivial mitral regurgitation, mild tricuspid regurgitation and a trivial pulmonic regurgitation.  No evidence of MI, scarring or infiltration.  #History of major depression listed in outside problem list.  previous diagnosis and treatment details are not available to me in EMR  I have referred her to psychiatrist  #  vaginal bleeding which stopped- she was seen by GYN and recommended for biopsy.   # She has poor IV access and was not able  to receive IV Emend and IV Aloxi. She does not want to have med port.   # GERD   Dr. Ola Spurr has increased her omeprazole to 40 mg daily. She feels symptoms are improved.  # 10/08/2020, cervical spine showed cervical  spondylosis.  Spinal canal diagnosis and a posterior disc osteophyte complex and superimposed small central disc protrusion contact the ventral spinal cord at C4-C5.  Multilevel neuroforaminal narrowing. bilateral atlantooccipital joint effusions, multilevel grade 1 spondylolisthesis. MRI lumbar spine without contrast showed acute or subacute fracture of L4.   Moderate spinal stenosis at L3-4 has progressed. Shallow left foraminal disc protrusion has progressed. Moderate subarticular stenosis on the left also with progression  Moderate spinal stenosis L4-5 with progression. Moderate subarticular stenosis on the left with progression  # 10/25/2020 seen by [facility] who recommend patient to switch Dara CyBorD to Dara RD  # 11/03/2020 Revlimid (lenalidomide) 10 mg by mouth daily for 14 days, then hold for 7 days. Pt was started on a shortened cycle in order to remain on similar schedule with infusion.   # 11/22/2020 seen by hepatologist Dr.Kappus Rodman Key. Recommended to start Zoloft $RemoveBefo'50mg'nWpukyAFWgE$  daily for pruritis # 11/24/2020 cycle 2 Revlimid 10 mg, she finished 14 days.  Rest of the course was held due to anemia.  Patient declined blood transfusion.  # Vaginal bleeding  patient was seen by Dr. Leafy Ro,  on megace $RemoveB'40mg'iaNPecUl$  daily. 12/22/2020 repeat EMBx - not diagnostic due to insufficient squamous component.   INTERVAL HISTORY Nancy Rodriguez is a 75 y.o. female who has above history reviewed by me today presents for follow up visit for management of AL amyloidosis Problems and complaints are listed below:  vaginal bleeding has stopped.  Patient received 1 unit of PRBC transfusion and 2 doses of IV Venofer treatments during interval.  Patient was accompanied by daughter today.  Daughter feels that patient's personality has changed, she is more agitated recently, daughter wonder if this is due to the PRBC transfusion/IV Venofer treatments. + fatigue + leg swelling.  no nausea vomiting diarrhea.  Patient's  son was called during this this encounter.  Patient was seen by [facility] hepatology.  Note was reviewed.  Patient has gained weight since last visit.    Review of Systems  Constitutional:  Positive for fatigue. Negative for appetite change, chills, fever and unexpected weight change.  HENT:   Negative for hearing loss, nosebleeds and voice change.   Eyes:  Positive for icterus. Negative for eye problems.  Respiratory:  Negative for chest tightness and cough.   Cardiovascular:  Positive for leg swelling. Negative for chest pain.  Gastrointestinal:  Negative for abdominal distention, abdominal pain, blood in stool and nausea.  Endocrine: Negative for hot flashes.  Genitourinary:  Negative for difficulty urinating and frequency.   Musculoskeletal:  Positive for back pain. Negative for arthralgias.  Skin:  Negative for rash.       jaundice  Neurological:  Positive for numbness. Negative for extremity weakness.  Hematological:  Negative for adenopathy.  Psychiatric/Behavioral:  Negative for confusion.    MEDICAL HISTORY:  Past Medical History:  Diagnosis Date   Anemia    Anemia in chronic kidney disease   Chronic kidney disease    Stage 3b chronic kidney disease   Diabetes mellitus without complication (HCC)    Hypercholesterolemia    Hypertension    MGUS (monoclonal gammopathy of unknown significance)    Osteoarthritis    Rheumatoid arthritis ([place])     SURGICAL HISTORY:  Past Surgical History:  Procedure Laterality Date   HERNIA REPAIR     PARATHYROIDECTOMY      SOCIAL HISTORY: Social History   Socioeconomic History   Marital status: Widowed    Spouse name: Not on file   Number of children: 6   Years of education: Not on file   Highest education level: Not on file  Occupational History   Not on file  Tobacco Use   Smoking status: Never   Smokeless tobacco: Never  Vaping Use   Vaping Use: Never used  Substance and Sexual Activity   Alcohol use: No   Drug use: Never    Sexual activity: Not on file  Other Topics Concern   Not on file  Social History Narrative   Not on file   Social Determinants of Health   Financial Resource Strain: Not on file  Food Insecurity: Not on file  Transportation Needs: Not on file  Physical Activity: Not on file  Stress: Not on file  Social Connections: Not on file  Intimate Partner Violence: Not on file    FAMILY HISTORY: Family History  Problem Relation Age of Onset   Diabetes Daughter    Diabetes Son    Dementia Mother    Cancer Father    Esophageal cancer Sister    Brain cancer Brother     ALLERGIES:  is allergic to benazepril, lisinopril, tolmetin, and nsaids.  MEDICATIONS:  Current Outpatient Medications  Medication Sig Dispense Refill   acyclovir (ZOVIRAX) 400 MG tablet Take 1 tablet (400 mg total) by mouth 2 (two) times daily. 60 tablet 5   albuterol (VENTOLIN HFA) 108 (90 Base) MCG/ACT inhaler Inhale 2 puffs into the lungs every 4 (four) hours as needed.     Ascorbic Acid (VITAMIN C) 500 MG CAPS Take 500 mcg by mouth daily. 30 capsule 1   aspirin EC 81 MG tablet Take 1 tablet (81 mg total) by mouth daily. Swallow whole. 30 tablet 0   Continuous Blood Gluc Sensor (FREESTYLE LIBRE 14 DAY SENSOR) MISC SMARTSIG:1 Kit(s) Topical Every 2 Weeks     dexamethasone (DECADRON) 4 MG tablet Take 5 tablets (20 mg total) by mouth once a week. 60 tablet 0   docusate sodium (COLACE) 100 MG capsule Take 1 capsule by mouth daily.     fluconazole (DIFLUCAN) 150 MG tablet Take 150 mg by mouth daily.     furosemide (LASIX) 20 MG tablet TAKE 2 TABLETS BY MOUTH DAILY. TAKE 1 TABLET DAILY FOR 3 DAYS OR UNTIL SWELLING IMPROVES 60 tablet 3   gabapentin (NEURONTIN) 300 MG capsule Take 300 mg by mouth at bedtime.     hydrOXYzine (ATARAX/VISTARIL) 10 MG tablet Take 10 mg by mouth 2 (two) times daily as needed.     insulin glargine (LANTUS SOLOSTAR) 100 UNIT/ML Solostar Pen Inject 17 Units into the skin 2 (two) times daily. Pt  takes in morning and bedtime.     KLOR-CON M20 20 MEQ tablet TAKE 2 TABLETS BY MOUTH TWICE A DAY 120 tablet 0   lenalidomide (REVLIMID) 10 MG capsule Take 1 capsule (10 mg total) by mouth daily. Take for 21 days, then hold for 7 days. Repeat every 28 days. 21 capsule 0   LORazepam (ATIVAN) 0.5 MG tablet Take 1 tablet (0.5 mg total) by mouth every 6 (six) hours as needed (nausea vomiting). 30 tablet 0   megestrol (MEGACE) 40 MG/ML suspension TAKE 10 MLS (400 MG TOTAL) BY MOUTH DAILY. 240 mL 0  metoprolol succinate (TOPROL-XL) 50 MG 24 hr tablet TAKE 1 TABLET (50 MG TOTAL) BY MOUTH DAILY IN THE MORNING     mirtazapine (REMERON) 7.5 MG tablet Take 1 tablet (7.5 mg total) by mouth at bedtime. 30 tablet 2   omeprazole (PRILOSEC) 20 MG capsule Take 1 capsule (20 mg total) by mouth daily. 30 capsule 1   ondansetron (ZOFRAN) 8 MG tablet TAKE 8 MG BY MOUTH 30 TO 60 MIN PRIOR TO CYTOXAN ADMINISTRATION THEN TAKE 8 MG TWICE DAILY AS NEEDED FOR NAUSEA AND VOMITING. 30 tablet 1   promethazine (PHENERGAN) 25 MG tablet TAKE 1 TABLET BY MOUTH EVERY 8 HOURS AS NEEDED FOR NAUSEA AND VOMITING 90 tablet 1   traMADol (ULTRAM) 50 MG tablet Take 1 tablet (50 mg total) by mouth every 6 (six) hours as needed for severe pain. 8 tablet 0   amLODipine (NORVASC) 5 MG tablet Take 5 mg by mouth daily.     EPINEPHrine 0.3 mg/0.3 mL IJ SOAJ injection Inject 0.3 mg into the muscle as needed. (Patient not taking: Reported on 12/30/2020)     ferrous sulfate 325 (65 FE) MG tablet Take 1 tablet (325 mg total) by mouth 3 (three) times daily with meals. (Patient not taking: Reported on 02/16/2021) 90 tablet 3   ondansetron (ZOFRAN) 8 MG tablet Take by mouth. (Patient not taking: Reported on 11/24/2020)     sertraline (ZOLOFT) 25 MG tablet Take by mouth. (Patient not taking: Reported on 11/24/2020)     No current facility-administered medications for this visit.   Facility-Administered Medications Ordered in Other Visits  Medication Dose  Route Frequency Provider Last Rate Last Admin   alteplase (CATHFLO ACTIVASE) injection 2 mg  2 mg Intracatheter Once PRN Rickard Patience, MD       heparin lock flush 100 unit/mL  500 Units Intracatheter Once PRN Rickard Patience, MD       heparin lock flush 100 unit/mL  250 Units Intracatheter Once PRN Rickard Patience, MD       heparin lock flush 100 unit/mL  500 Units Intracatheter Daily PRN Rickard Patience, MD       heparin lock flush 100 unit/mL  250 Units Intracatheter PRN Rickard Patience, MD       sodium chloride flush (NS) 0.9 % injection 10 mL  10 mL Intracatheter Once PRN Rickard Patience, MD       sodium chloride flush (NS) 0.9 % injection 10 mL  10 mL Intracatheter PRN Rickard Patience, MD       sodium chloride flush (NS) 0.9 % injection 3 mL  3 mL Intracatheter Once PRN Rickard Patience, MD       sodium chloride flush (NS) 0.9 % injection 3 mL  3 mL Intracatheter PRN Rickard Patience, MD         PHYSICAL EXAMINATION: ECOG PERFORMANCE STATUS: 1 - Symptomatic but completely ambulatory Vitals:   03/02/21 0847  BP: (!) 144/75  Pulse: 90  Temp: 97.8 F (36.6 C)   Filed Weights   03/02/21 0847  Weight: 139 lb 14.4 oz (63.5 kg)    Physical Exam Constitutional:      General: She is not in acute distress. HENT:     Head: Normocephalic and atraumatic.  Eyes:     General: No scleral icterus. Cardiovascular:     Rate and Rhythm: Normal rate and regular rhythm.     Heart sounds: Murmur heard.  Pulmonary:     Effort: Pulmonary effort is normal. No respiratory distress.  Breath sounds: No wheezing.  Abdominal:     General: Bowel sounds are normal. There is distension.     Palpations: Abdomen is soft.  Musculoskeletal:        General: No deformity. Normal range of motion.     Cervical back: Normal range of motion and neck supple.     Comments: Bilateral lower extremities +1 edema  Skin:    General: Skin is warm and dry.     Findings: No erythema or rash.  Neurological:     Mental Status: She is alert and oriented to person, place, and  time. Mental status is at baseline.     Cranial Nerves: No cranial nerve deficit.     Coordination: Coordination normal.  Psychiatric:        Mood and Affect: Mood normal.    LABORATORY DATA:  I have reviewed the data as listed Lab Results  Component Value Date   WBC 8.3 03/02/2021   HGB 7.7 (L) 03/02/2021   HCT 23.3 (L) 03/02/2021   MCV 77.9 (L) 03/02/2021   PLT 415 (H) 03/02/2021   Recent Labs    01/19/21 0933 02/16/21 0911 03/02/21 0821  NA 133* 131* 134*  K 3.6 4.5 4.1  CL 104 99 110  CO2 20* 23 16*  GLUCOSE 285* 375* 377*  BUN 17 21 24*  CREATININE 0.87 1.07* 0.96  CALCIUM 8.2* 8.3* 8.3*  GFRNONAA >60 54* >60  PROT 5.2* 5.3* 5.4*  ALBUMIN 2.1* 2.1* 2.2*  AST 41 35 33  ALT 38 29 33  ALKPHOS 808* 744* 716*  BILITOT 2.7* 2.4* 2.2*    Iron/TIBC/Ferritin/ %Sat    Component Value Date/Time   IRON 23 (L) 02/16/2021 1042   TIBC 316 02/16/2021 1042   FERRITIN 28 02/16/2021 1042   IRONPCTSAT 7 (L) 02/16/2021 1042     08/25/2019, platelet count 491, WBC 7.5, hemoglobin 12 Creatinine 1.58, EGFR 37, calcium 10.8, albumin 4.2 Negative hepatitis B surface antigen, hepatitis B core antibody, Negative hepatitis C 08/05/2019, A1c 11.2   RADIOGRAPHIC STUDIES: I have personally reviewed the radiological images as listed and agreed with the findings in the report. No results found.     ASSESSMENT & PLAN:  1. Light chain (AL) amyloidosis (HCC)   2. Abdominal distention   3. Encounter for antineoplastic chemotherapy   4. 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HCC)   5. Elevated LFTs   6. Hyperglycemia   7. Vaginal bleeding   8. Iron deficiency anemia due to chronic blood loss     # AL-Amyloidosis Liver involvement, possible kidney involvement-on second line treatment with daratumumab Revlimid dexamethasone. Labs are reviewed and discussed with patient. Proceed with daratumumab.  I will hold off Revlimid for this cycle due to anemia. Patient has a sudden  weight gain, abdomen is distended chronically.  Repeat abdomen ultrasound.  #Iron deficiency anemia, patient has received PRBC transfusion and IV iron treatments.  Overall she tolerates well. As far as the personality changes, to my knowledge, I do not think this is secondary to blood transfusion or IV Venofer treatments.  Wonder if patient has neurological manifestation of amyloidosis.  Continue close monitor. I recommend weekly Venofer 200 mg x3. Anemia is multifactorial, from amyloidosis, chemotherapy and iron deficiency.  #Elevated LFT, due to amyloidosis liver involvement.  stable liver cyst. Bilirubin is improved since dc of Velcade. Improving  Total bilirubin is 2.2 today.  Repeat ultrasound abdomen.  # uncontrolled DM, weekly Dexamethasone use Recommend patient to measure  morning and evening blood glucose and call her PCP for recommendation of Lantus.   # vaginal bleeding. ? Involvedness of amyloidosis vs other etiologies.  Bleeding stopped.  Follow-up with Dr. Leafy Ro.   We spent sufficient time to discuss many aspect of care, questions were answered to patient's satisfaction.  Lab MD 2 weeks, repeat lab see palliative care service-Venofer 4 weeks lab MD Daratumumab/Venofer  cc Leonel Ramsay, MD   [name], MD, PhD  03/02/2021

## 2021-03-02 NOTE — Patient Instructions (Signed)
[facility]  Discharge Instructions: Thank you for choosing [facility] to provide your oncology and hematology care.  If you have a lab appointment with the [facility], please go directly to the [facility] and check in at the registration area.  Wear comfortable clothing and clothing appropriate for easy access to any Portacath or PICC line.   We strive to give you quality time with your provider. You may need to reschedule your appointment if you arrive late (15 or more minutes).  Arriving late affects you and other patients whose appointments are after yours.  Also, if you miss three or more appointments without notifying the office, you may be dismissed from the clinic at the provider's discretion.      For prescription refill requests, have your pharmacy contact our office and allow 72 hours for refills to be completed.    Today you received the following chemotherapy and/or immunotherapy agents VENOFER and DARZALEX      To help prevent nausea and vomiting after your treatment, we encourage you to take your nausea medication as directed.  BELOW ARE SYMPTOMS THAT SHOULD BE REPORTED IMMEDIATELY: *FEVER GREATER THAN 100.4 F (38 C) OR HIGHER *CHILLS OR SWEATING *NAUSEA AND VOMITING THAT IS NOT CONTROLLED WITH YOUR NAUSEA MEDICATION *UNUSUAL SHORTNESS OF BREATH *UNUSUAL BRUISING OR BLEEDING *URINARY PROBLEMS (pain or burning when urinating, or frequent urination) *BOWEL PROBLEMS (unusual diarrhea, constipation, pain near the anus) TENDERNESS IN MOUTH AND THROAT WITH OR WITHOUT PRESENCE OF ULCERS (sore throat, sores in mouth, or a toothache) UNUSUAL RASH, SWELLING OR PAIN  UNUSUAL VAGINAL DISCHARGE OR ITCHING   Items with * indicate a potential emergency and should be followed up as soon as possible or go to the Emergency Department if any problems should occur.  Please show the CHEMOTHERAPY ALERT CARD or IMMUNOTHERAPY ALERT CARD at  check-in to the Emergency Department and triage nurse.  Should you have questions after your visit or need to cancel or reschedule your appointment, please contact [facility]  [phone] and follow the prompts.  Office hours are 8:00 a.m. to 4:30 p.m. Monday - Friday. Please note that voicemails left after 4:00 p.m. may not be returned until the following business day.  We are closed weekends and major holidays. You have access to a nurse at all times for urgent questions. Please call the main number to the clinic [phone] and follow the prompts.  For any non-urgent questions, you may also contact your provider using MyChart. We now offer e-Visits for anyone 45 and older to request care online for non-urgent symptoms. For details visit [email or web address].   Also download the MyChart app! Go to the app store, search "MyChart", open the app, select [facility], and log in with your MyChart username and password.  Due to Covid, a mask is required upon entering the hospital/clinic. If you do not have a mask, one will be given to you upon arrival. For doctor visits, patients may have 1 support person aged 68 or older with them. For treatment visits, patients cannot have anyone with them due to current Covid guidelines and our immunocompromised population.   Iron Sucrose Injection What is this medication? IRON SUCROSE (EYE ern SOO krose) treats low levels of iron (iron deficiency anemia) in people with kidney disease. Iron is a mineral that plays an important role in making red blood cells, which carry oxygen from your lungs to the rest of your body. This  medicine may be used for other purposes; ask your health care provider or pharmacist if you have questions. COMMON BRAND NAME(S): Venofer What should I tell my care team before I take this medication? They need to know if you have any of these conditions: Anemia not caused by low iron levels Heart  disease High levels of iron in the blood Kidney disease Liver disease An unusual or allergic reaction to iron, other medications, foods, dyes, or preservatives Pregnant or trying to get pregnant Breast-feeding How should I use this medication? This medication is for infusion into a vein. It is given in a hospital or clinic setting. Talk to your care team about the use of this medication in children. While this medication may be prescribed for children as young as 2 years for selected conditions, precautions do apply. Overdosage: If you think you have taken too much of this medicine contact a poison control center or emergency room at once. NOTE: This medicine is only for you. Do not share this medicine with others. What if I miss a dose? It is important not to miss your dose. Call your care team if you are unable to keep an appointment. What may interact with this medication? Do not take this medication with any of the following: Deferoxamine Dimercaprol Other iron products This medication may also interact with the following: Chloramphenicol Deferasirox This list may not describe all possible interactions. Give your health care provider a list of all the medicines, herbs, non-prescription drugs, or dietary supplements you use. Also tell them if you smoke, drink alcohol, or use illegal drugs. Some items may interact with your medicine. What should I watch for while using this medication? Visit your care team regularly. Tell your care team if your symptoms do not start to get better or if they get worse. You may need blood work done while you are taking this medication. You may need to follow a special diet. Talk to your care team. Foods that contain iron include: whole grains/cereals, dried fruits, beans, or peas, leafy green vegetables, and organ meats (liver, kidney). What side effects may I notice from receiving this medication? Side effects that you should report to your care team as  soon as possible: Allergic reactions--skin rash, itching, hives, swelling of the face, lips, tongue, or throat Low blood pressure--dizziness, feeling faint or lightheaded, blurry vision Shortness of breath Side effects that usually do not require medical attention (report to your care team if they continue or are bothersome): Flushing Headache Joint pain Muscle pain Nausea Pain, redness, or irritation at injection site This list may not describe all possible side effects. Call your doctor for medical advice about side effects. You may report side effects to FDA at 1-800-FDA-1088. Where should I keep my medication? This medication is given in a hospital or clinic and will not be stored at home. NOTE: This sheet is a summary. It may not cover all possible information. If you have questions about this medicine, talk to your doctor, pharmacist, or health care provider.  2022 Elsevier/Gold Standard (2020-08-26 00:00:00)  Daratumumab injection What is this medication? DARATUMUMAB (dar a toom ue mab) is a monoclonal antibody. It is used to treat multiple myeloma. This medicine may be used for other purposes; ask your health care provider or pharmacist if you have questions. COMMON BRAND NAME(S): DARZALEX What should I tell my care team before I take this medication? They need to know if you have any of these conditions: hereditary fructose intolerance infection (especially  a virus infection such as chickenpox, herpes, or hepatitis B virus) lung or breathing disease (asthma, COPD) an unusual or allergic reaction to daratumumab, sorbitol, other medicines, foods, dyes, or preservatives pregnant or trying to get pregnant breast-feeding How should I use this medication? This medicine is for infusion into a vein. It is given by a health care professional in a hospital or clinic setting. Talk to your pediatrician regarding the use of this medicine in children. Special care may be  needed. Overdosage: If you think you have taken too much of this medicine contact a poison control center or emergency room at once. NOTE: This medicine is only for you. Do not share this medicine with others. What if I miss a dose? Keep appointments for follow-up doses as directed. It is important not to miss your dose. Call your doctor or health care professional if you are unable to keep an appointment. What may interact with this medication? Interactions have not been studied. This list may not describe all possible interactions. Give your health care provider a list of all the medicines, herbs, non-prescription drugs, or dietary supplements you use. Also tell them if you smoke, drink alcohol, or use illegal drugs. Some items may interact with your medicine. What should I watch for while using this medication? Your condition will be monitored carefully while you are receiving this medicine. This medicine can cause serious allergic reactions. To reduce your risk, your health care provider may give you other medicine to take before receiving this one. Be sure to follow the directions from your health care provider. This medicine can affect the results of blood tests to match your blood type. These changes can last for up to 6 months after the final dose. Your healthcare provider will do blood tests to match your blood type before you start treatment. Tell all of your healthcare providers that you are being treated with this medicine before receiving a blood transfusion. This medicine can affect the results of some tests used to determine treatment response; extra tests may be needed to evaluate response. Do not become pregnant while taking this medicine or for 3 months after stopping it. Women should inform their health care provider if they wish to become pregnant or think they might be pregnant. There is a potential for serious side effects to an unborn child. Talk to your health care provider for  more information. Do not breast-feed an infant while taking this medicine. What side effects may I notice from receiving this medication? Side effects that you should report to your care team as soon as possible: Allergic reactions--skin rash, itching or hives, swelling of the face, lips, or tongue Blurred vision Infection--fever, chills, cough, sore throat, pain or trouble passing urine Infusion reactions--dizziness, fast heartbeat, feeling faint or lightheaded, falls, headache, increase in blood pressure, nausea, vomiting, or wheezing or trouble breathing with loud or whistling sounds Unusual bleeding or bruising Side effects that usually do not require medical attention (report to your care team if they continue or are bothersome): Constipation Diarrhea Pain, tingling, numbness in the hands or feet Swelling of the ankles, feet, hands Tiredness This list may not describe all possible side effects. Call your doctor for medical advice about side effects. You may report side effects to FDA at 1-800-FDA-1088. Where should I keep my medication? This drug is given in a hospital or clinic and will not be stored at home. NOTE: This sheet is a summary. It may not cover all possible information. If you  have questions about this medicine, talk to your doctor, pharmacist, or health care provider.  2022 Elsevier/Gold Standard (2020-09-08 00:00:00)

## 2021-03-02 NOTE — Progress Notes (Signed)
Nutrition Follow-up:  Patient with monoclonal gammopathy with AL amyloidosis.  Patient receiving DaraCyBoD.    Met with patient during infusion.  Patient says that her appetite is good.  Recall yesterday that she ate fish, carrots, peas, rice for lunch.  Boiled eggs in the evening.  Chicken soup for breakfast.  Says that she needs to take her blood sugar medication.  Talked about how she sometimes eats chips, candy and daughter does not want her too.  "She is afraid that I am going to die."  "We all die."      Medications: reviewed  Labs: glucose 377  Anthropometrics:   Weight 139 lb 14.4 oz today (swelling in legs)  135 lb 4.8 oz 129 lb 6.4 oz on 9/9 142 lb on 5/25   NUTRITION DIAGNOSIS: Unintentional weight loss stable, fluid collection makes it hard to determine true weight gain.    INTERVENTION:  Patient asked that RD speak with daughter in waiting area.  RD went to waiting area and daughter not present.  Called daughter but was not able to reach her.  Strongly encouraged patient to take diabetes medication as prescribed.   Would prefer not to place strict diet restrictions on patient due to previous weight loss.  Seems to be conflict between what patient wants to do and want daughter wants patient to do.  Would continue ensure plus shakes as higher in calories than diabetic shakes.       MONITORING, EVALUATION, GOAL: weight trends, intake   NEXT VISIT: to be determined with treatment  Nancy Rodriguez, [place], [place] Registered Dietitian [phone] (mobile)

## 2021-03-03 ENCOUNTER — Other Ambulatory Visit: Payer: Self-pay

## 2021-03-03 ENCOUNTER — Ambulatory Visit: Payer: Medicare Other

## 2021-03-03 ENCOUNTER — Other Ambulatory Visit (HOSPITAL_COMMUNITY): Payer: Self-pay | Admitting: Infectious Diseases

## 2021-03-03 ENCOUNTER — Ambulatory Visit
Admission: RE | Admit: 2021-03-03 | Discharge: 2021-03-03 | Disposition: A | Discharge status: Home / Self Care | Payer: Medicare Other | Source: Ambulatory Visit | Attending: Oncology | Admitting: Oncology

## 2021-03-03 ENCOUNTER — Other Ambulatory Visit: Payer: Self-pay | Admitting: Infectious Diseases

## 2021-03-03 ENCOUNTER — Ambulatory Visit
Admission: RE | Admit: 2021-03-03 | Discharge: 2021-03-03 | Disposition: A | Discharge status: Home / Self Care | Payer: Medicare Other | Source: Ambulatory Visit | Attending: Infectious Diseases | Admitting: Infectious Diseases

## 2021-03-03 DIAGNOSIS — S0990XA Unspecified injury of head, initial encounter: Secondary | ICD-10-CM

## 2021-03-03 DIAGNOSIS — D500 Iron deficiency anemia secondary to blood loss (chronic): Secondary | ICD-10-CM

## 2021-03-03 DIAGNOSIS — R140 Abdominal distension (gaseous): Secondary | ICD-10-CM | POA: Insufficient documentation

## 2021-03-03 DIAGNOSIS — K8689 Other specified diseases of pancreas: Secondary | ICD-10-CM

## 2021-03-03 DIAGNOSIS — K8020 Calculus of gallbladder without cholecystitis without obstruction: Secondary | ICD-10-CM | POA: Diagnosis not present

## 2021-03-03 DIAGNOSIS — R188 Other ascites: Secondary | ICD-10-CM | POA: Diagnosis not present

## 2021-03-03 DIAGNOSIS — I6782 Cerebral ischemia: Secondary | ICD-10-CM | POA: Insufficient documentation

## 2021-03-03 DIAGNOSIS — E8581 Light chain (AL) amyloidosis: Secondary | ICD-10-CM

## 2021-03-03 DIAGNOSIS — W1800XA Striking against unspecified object with subsequent fall, initial encounter: Secondary | ICD-10-CM | POA: Insufficient documentation

## 2021-03-03 IMAGING — US US ABDOMEN COMPLETE
1 series · 15 of 25 positions shown · non-contrast
Comparison: None.

CLINICAL DATA: Abdominal distension

EXAM:
ABDOMEN ULTRASOUND COMPLETE

[Series 2: us abdomen complete · 15 of 99 slices shown]
[im 1/99]
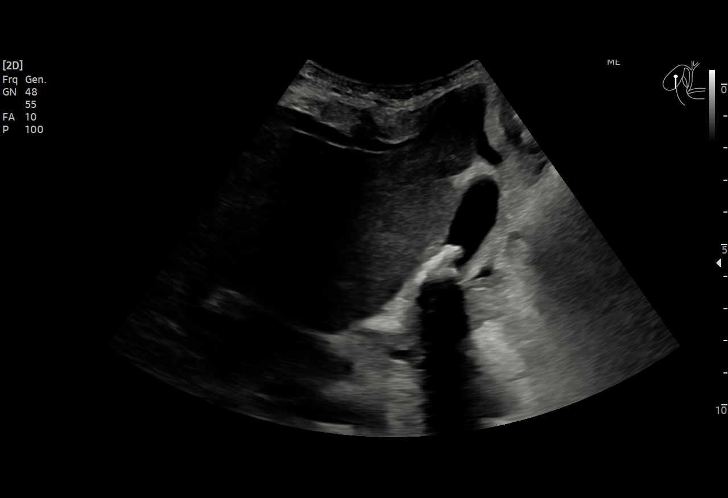
[im 9/99]
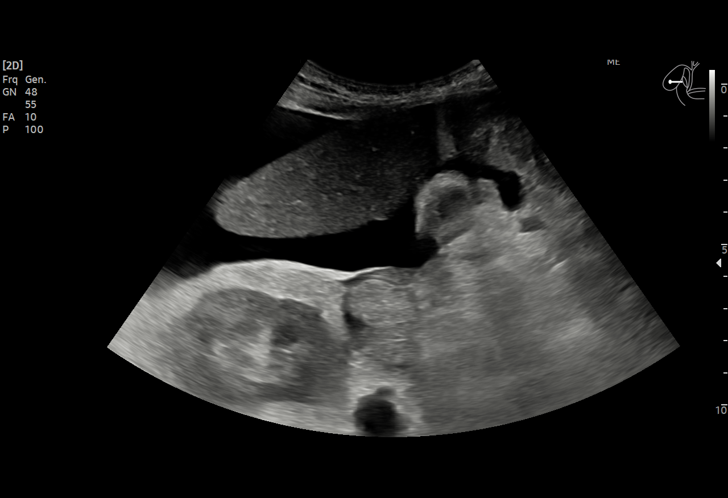
[im 17/99]
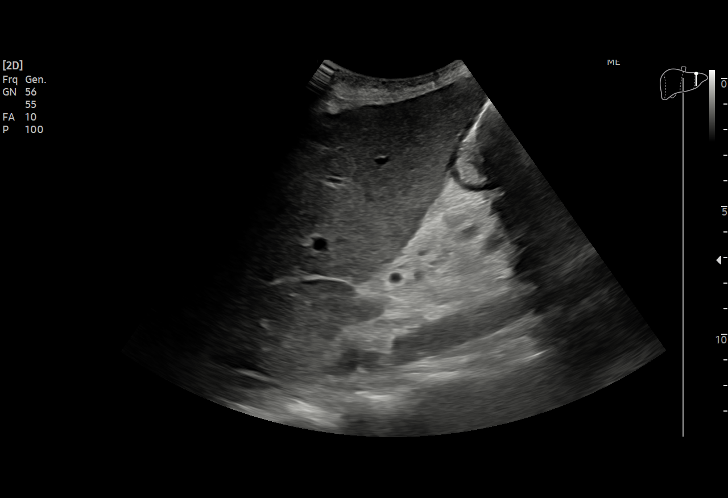
[im 21/99]
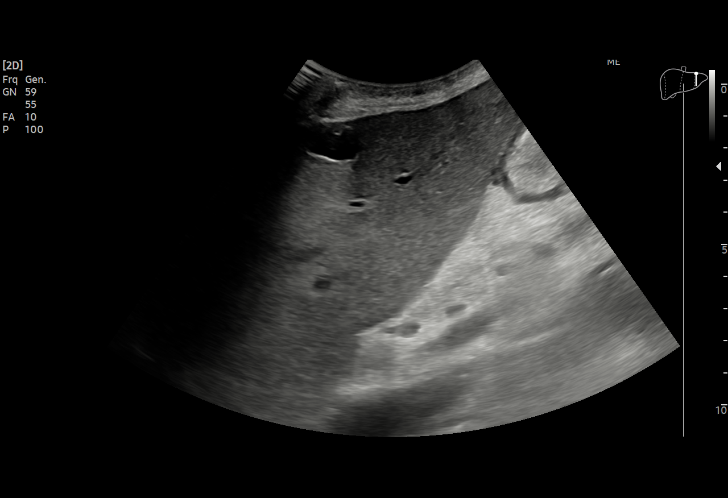
[im 29/99]
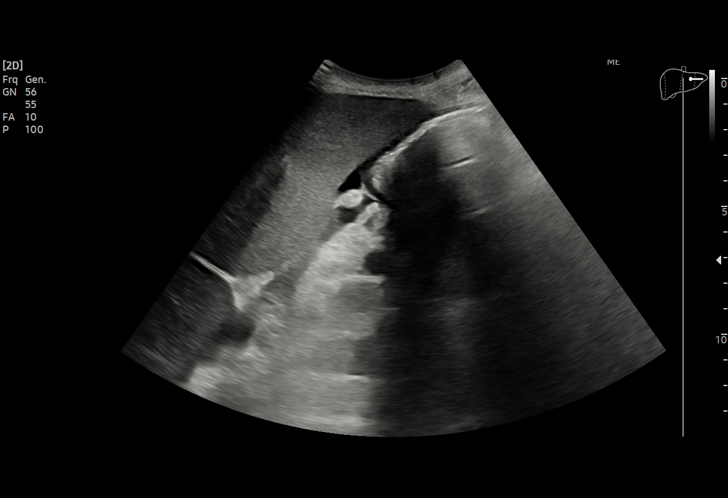
[im 37/99]
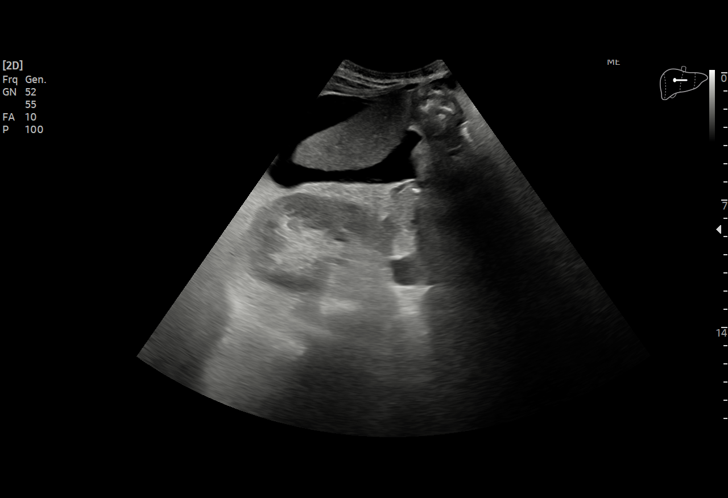
[im 41/99]
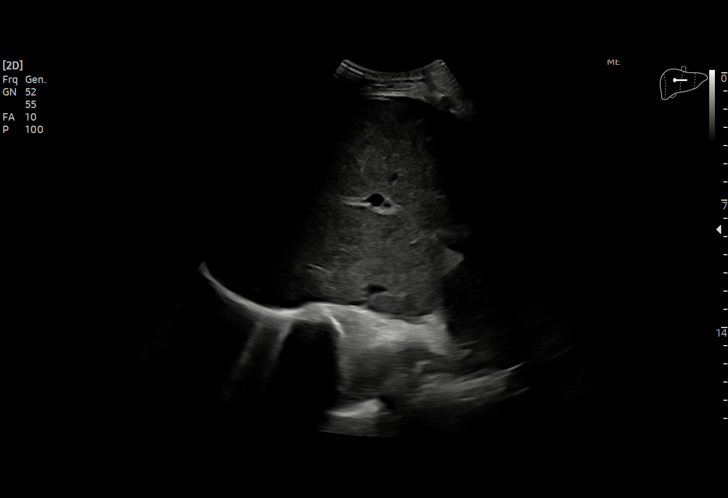
[im 50/99]
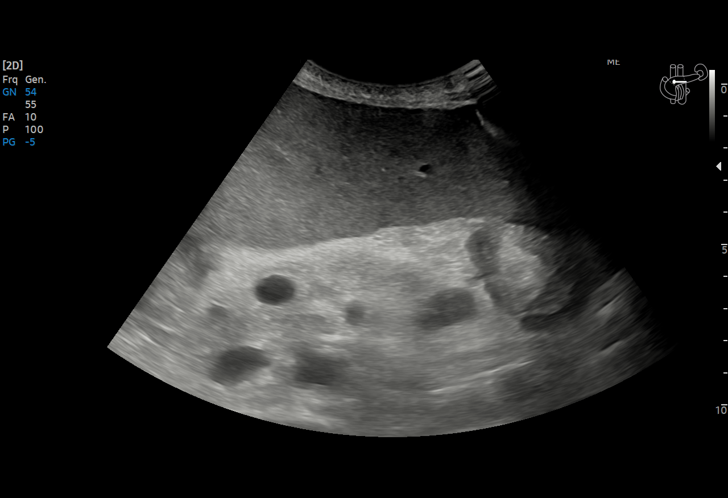
[im 58/99]
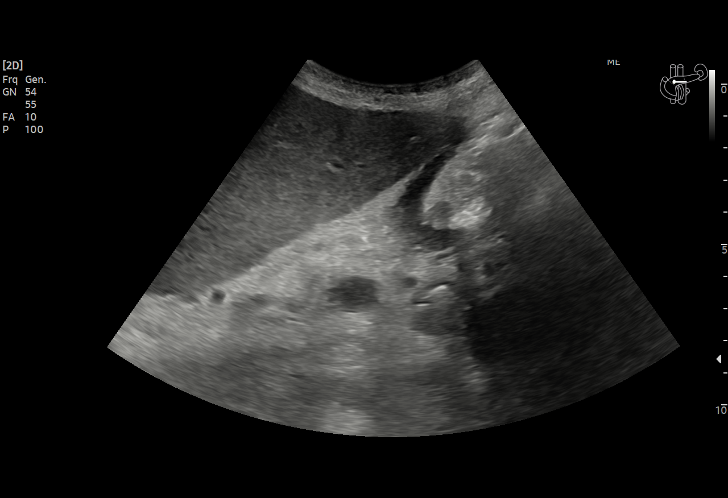
[im 62/99]
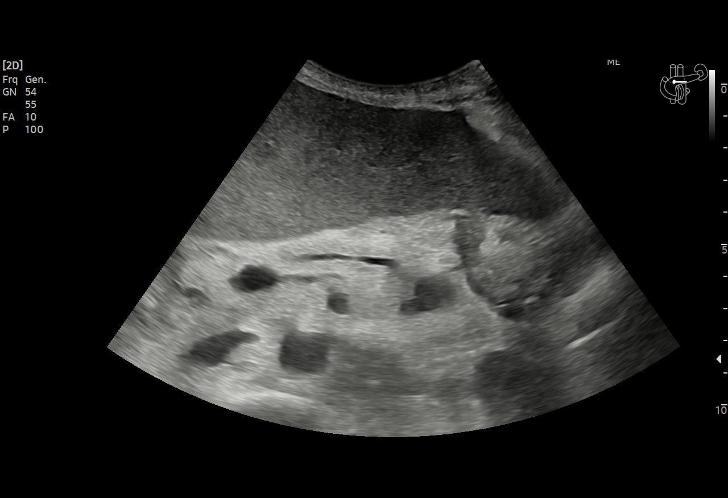
[im 70/99]
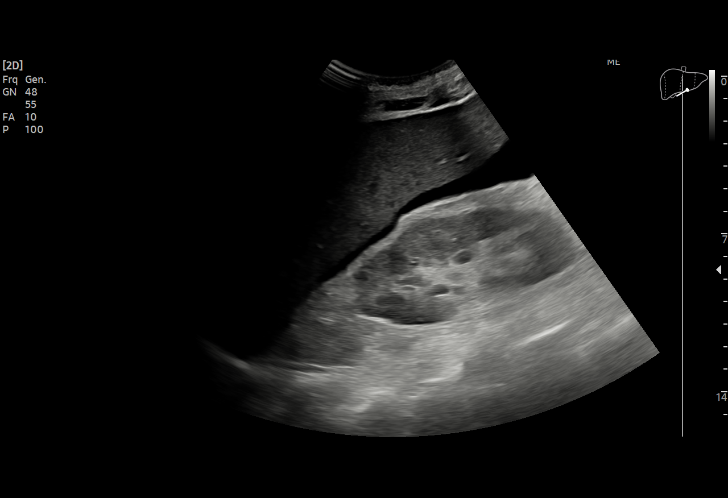
[im 78/99]
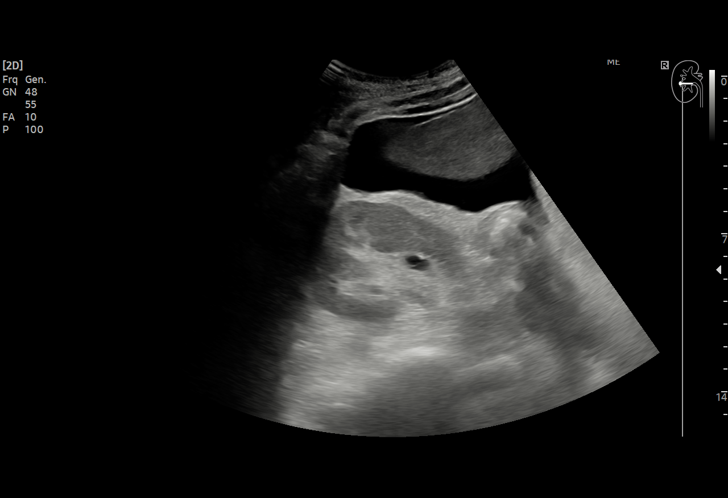
[im 82/99]
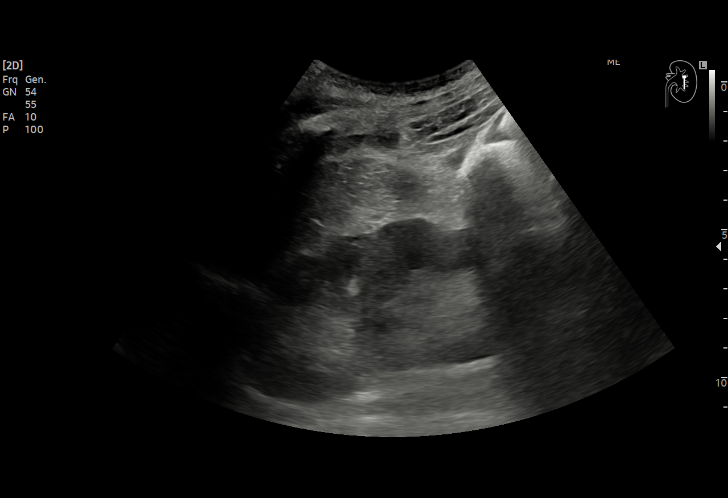
[im 90/99]
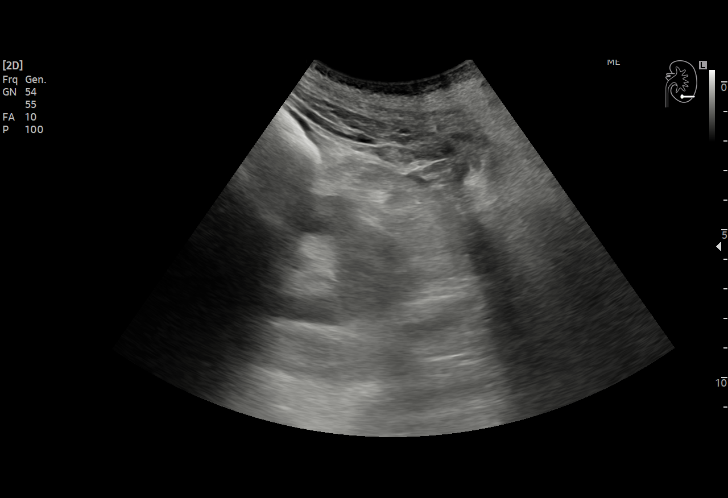
[im 99/99]
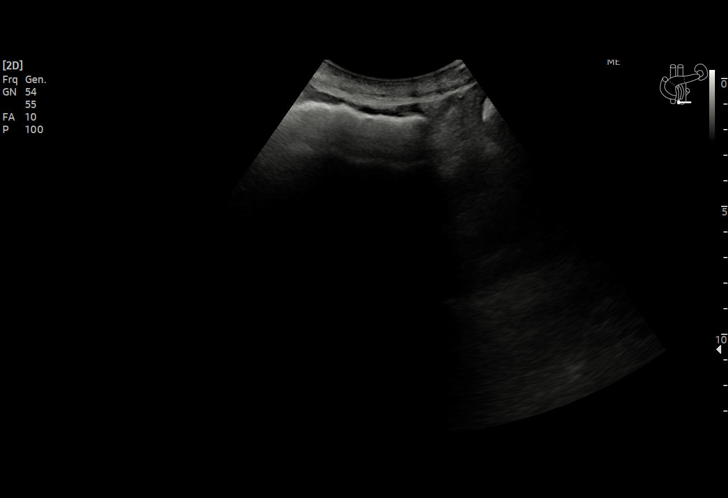

[15 of 25 positions shown; findings below may reference images not displayed]

FINDINGS: Gallbladder: Gallstones. No wall thickening visualized. No
sonographic Murphy sign noted by sonographer.

Common bile duct: Diameter: 5 mm

Liver: No focal lesion identified. Coarse, nodular contour of the
liver. Increased parenchymal echogenicity. Portal vein is patent on
color Doppler imaging with normal direction of blood flow towards
the liver.

IVC: No abnormality visualized.

Pancreas: Possible hypoechoic mass in the pancreatic tail measuring
1.8 x 1.2 x 1.4 cm.

Spleen: Size and appearance within normal limits.

Right Kidney: Length: 10.2 cm. Echogenicity within normal limits. No
mass or hydronephrosis visualized.

Left Kidney: Length: 10.1 cm. Echogenicity within normal limits. No
mass or hydronephrosis visualized.

Abdominal aorta: No aneurysm visualized.

Other findings: Small volume perihepatic ascites.
IMPRESSION: 1. Possible hypoechoic mass in the pancreatic tail measuring 1.8 x
1.2 x 1.4 cm. Recommend multiphasic contrast enhanced pancreatic
protocol CT or MRI to further evaluate.
2. Cirrhotic morphology of the liver with small volume perihepatic
ascites.
3. Cholelithiasis.

## 2021-03-03 IMAGING — CT CT HEAD W/O CM
4 series · 17 of 47 positions shown, 19 images · non-contrast
Comparison: None.

CLINICAL DATA: Recent fall, head injury over the left orbit

EXAM:
CT HEAD WITHOUT CONTRAST
TECHNIQUE: Contiguous axial images were obtained from the base of the skull
through the vertex without intravenous contrast.

[Series 2: head wo · axial · 0.40mm/px · z∈[+122,+237]mm · 7 of 31 slices shown, 9 images]
[im 4/31  brain]
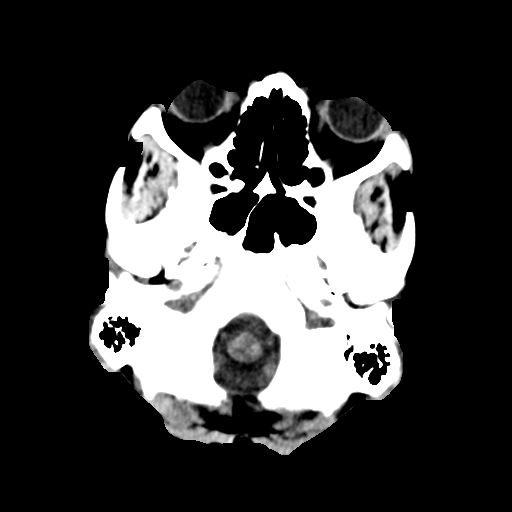
[im 4/31  bone]
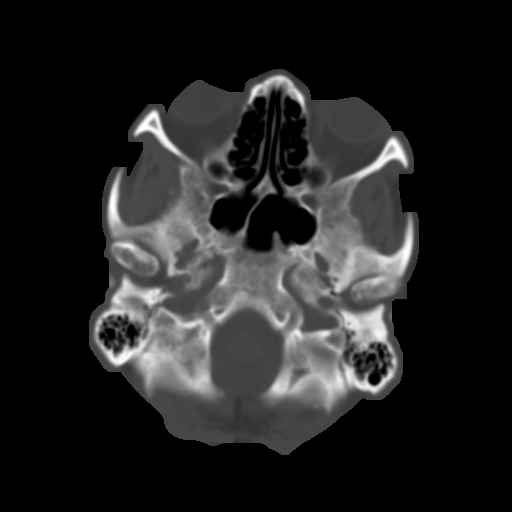
[im 8/31  brain]
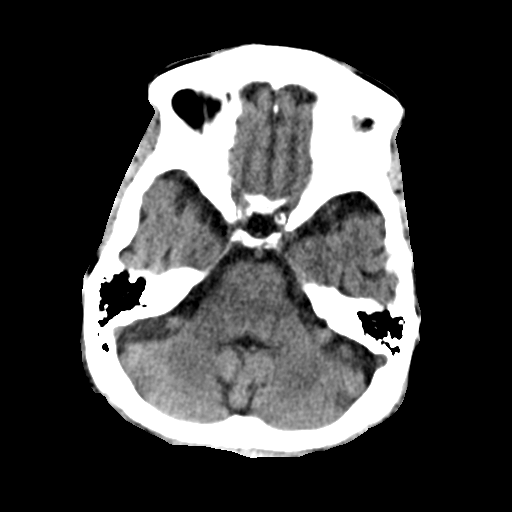
[im 12/31  brain]
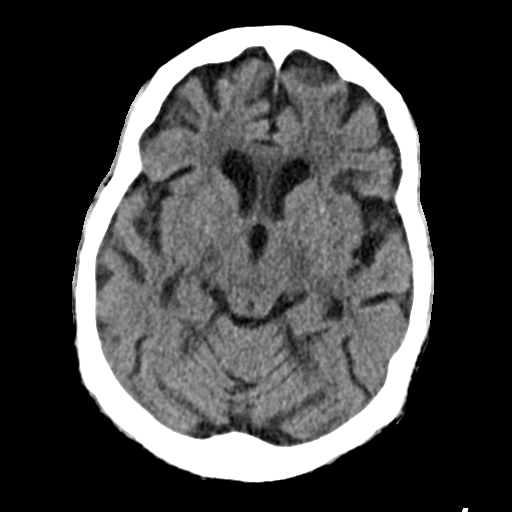
[im 16/31  brain]
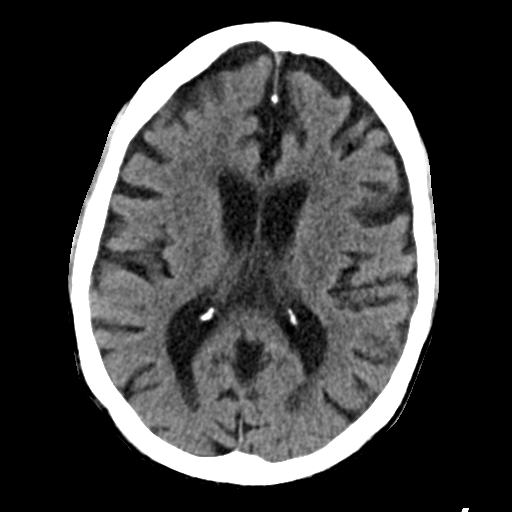
[im 19/31  brain]
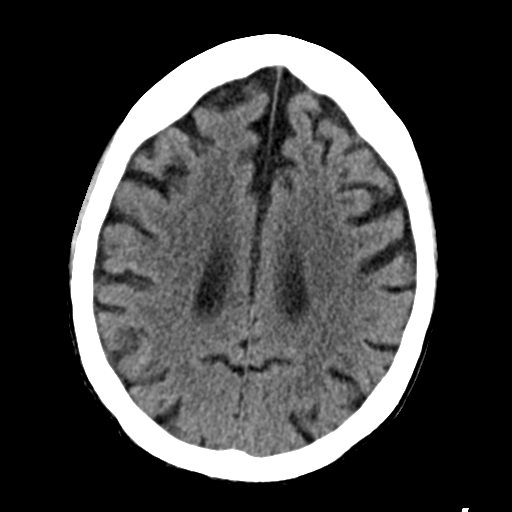
[im 19/31  bone]
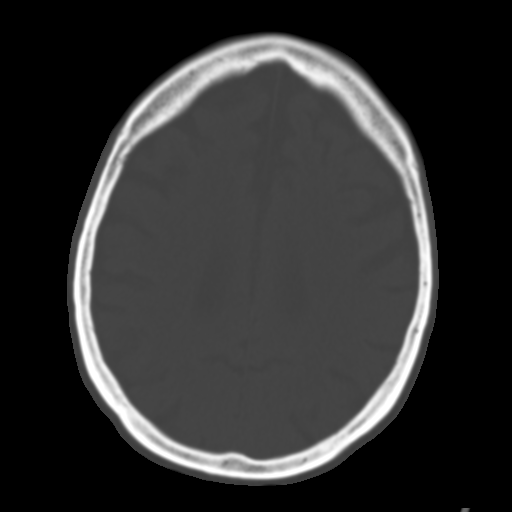
[im 23/31  brain]
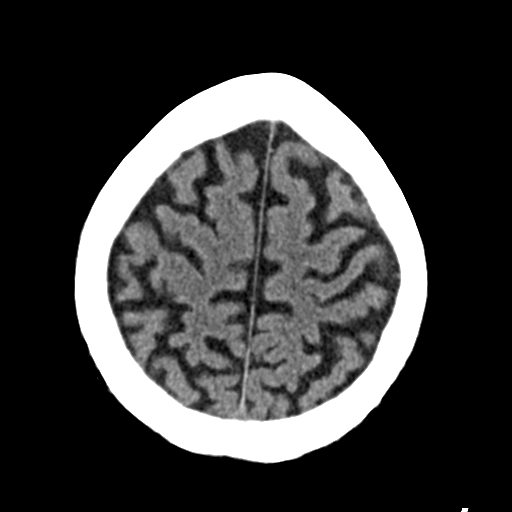
[im 27/31  brain]
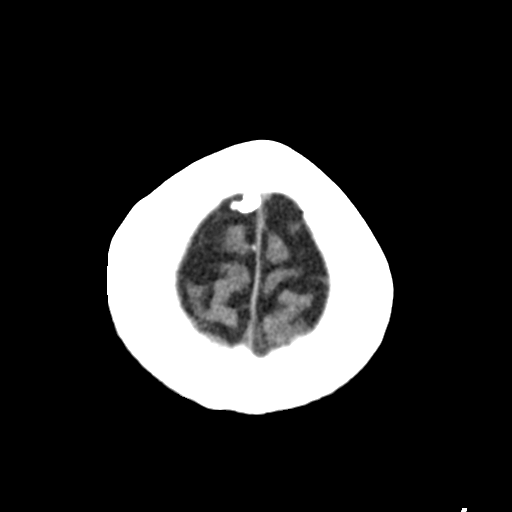

[Series 3: head bone · axial · 0.40mm/px · z∈[+121,+175]mm · 4 of 77 slices shown]
[im 8/77  bone]
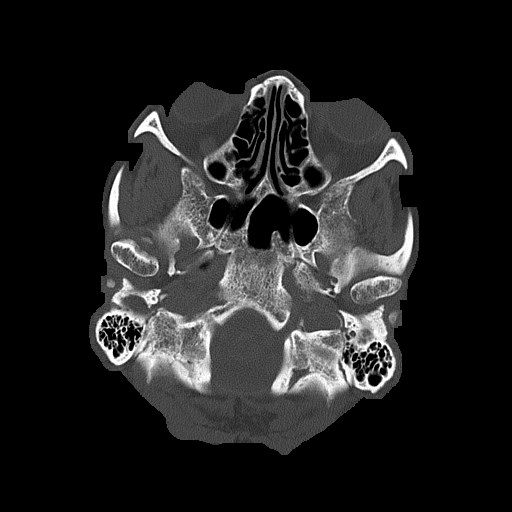
[im 16/77  bone]
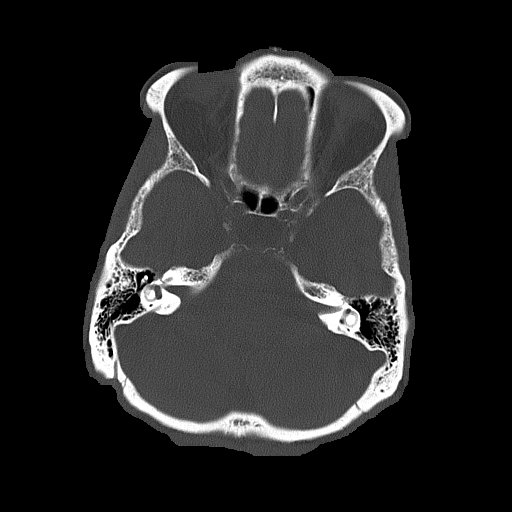
[im 23/77  bone]
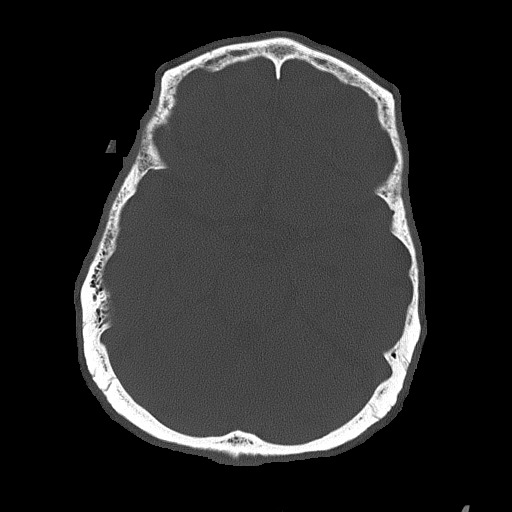
[im 35/77  bone]
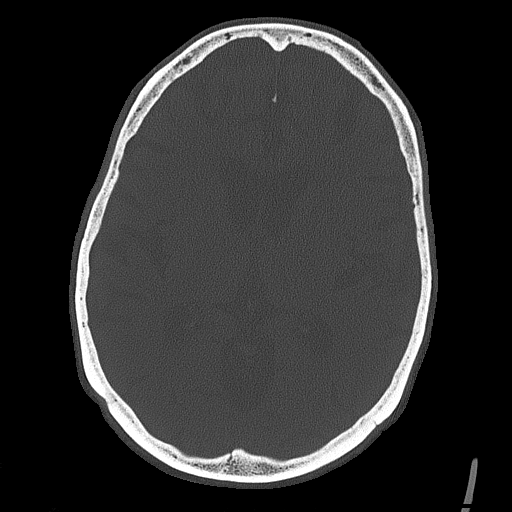

[Series 4: coronal soft tissue · coronal · 0.31mm/px · 3 of 66 slices shown]
[im 22/66  brain]
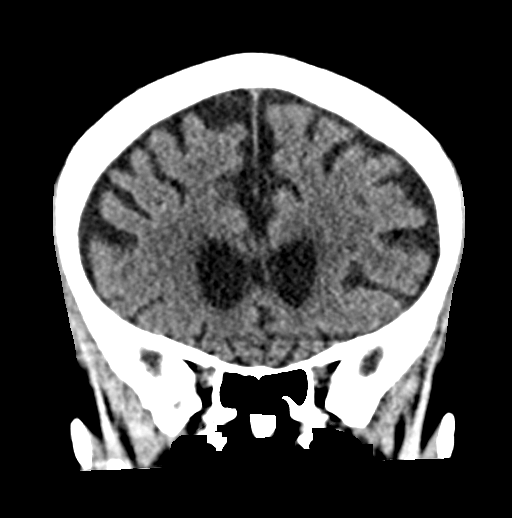
[im 29/66  brain]
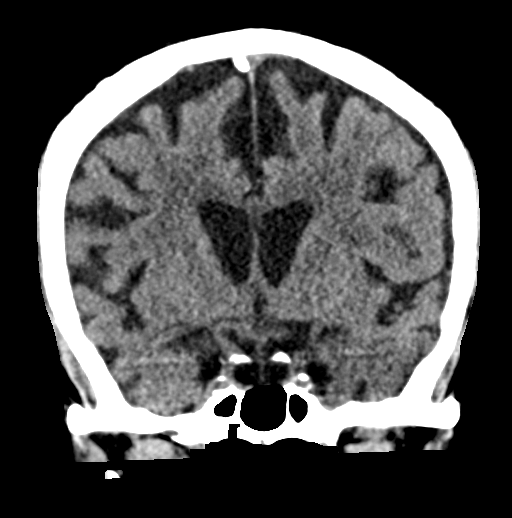
[im 37/66  brain]
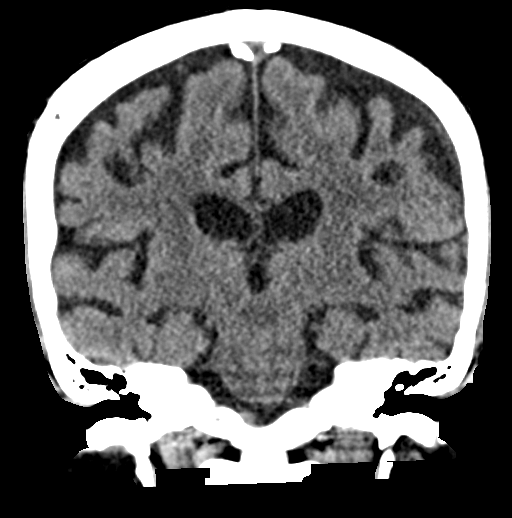

[Series 5: sagittal soft tissue · sagittal · 0.32mm/px · 3 of 51 slices shown]
[im 17/51  brain]
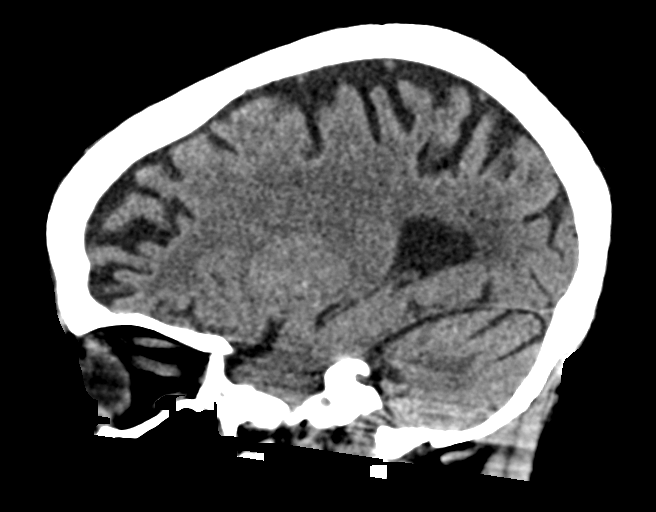
[im 26/51  brain]
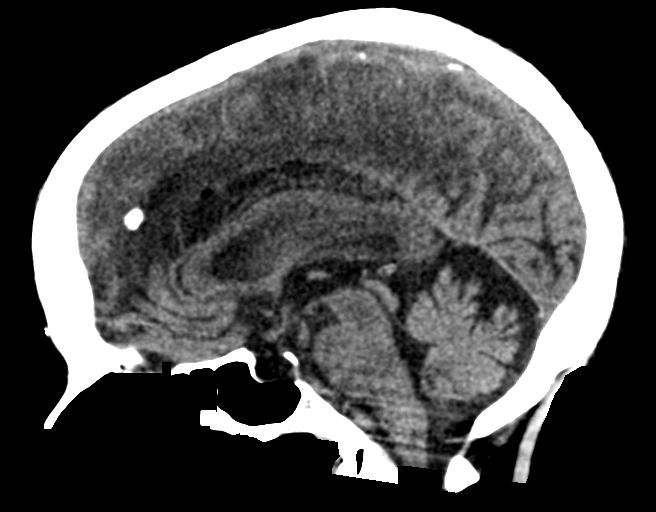
[im 34/51  brain]
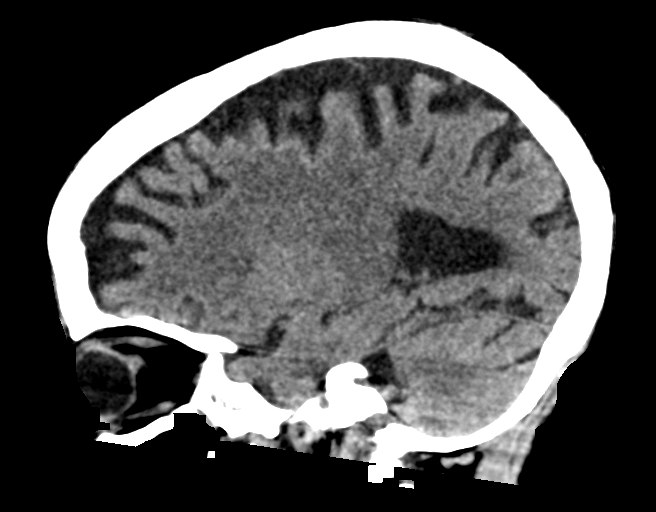

[17 of 47 positions shown; findings below may reference images not displayed]

FINDINGS: Brain: Atrophy and minor white matter microvascular ischemic
changes. No acute intracranial hemorrhage, mass lesion, definite
infarction, midline shift, herniation, or hydrocephalus. Cisterns
are patent. Cerebellar atrophy as well.

Vascular: No hyperdense vessel or unexpected calcification.

Skull: Normal. Negative for fracture or focal lesion.

Sinuses/Orbits: No acute finding.

Other: None.
IMPRESSION: Atrophy and white matter microvascular ischemic changes. No acute
intracranial abnormality by noncontrast CT.

## 2021-03-03 NOTE — Telephone Encounter (Signed)
Pt informed and verbalized understanding. MRI order entered. Please schedule next available and notify pt of appt.

## 2021-03-03 NOTE — Telephone Encounter (Signed)
Called to inform patient, no answer. Left vm to call back.

## 2021-03-03 NOTE — Telephone Encounter (Signed)
-----   Message from [name], MD sent at 03/03/2021 12:31 PM EST ----- Please let patient and family that US showed small amount of fluid, most likely not enough to tap.  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  Recommend MRI abdomen w wo contrast - pancreatic mass.

## 2021-03-06 ENCOUNTER — Ambulatory Visit: Payer: Medicare Other

## 2021-03-06 NOTE — Telephone Encounter (Signed)
Ill schedule MRI

## 2021-03-07 ENCOUNTER — Inpatient Hospital Stay: Payer: Medicare Other

## 2021-03-07 ENCOUNTER — Other Ambulatory Visit: Payer: Self-pay

## 2021-03-07 NOTE — Progress Notes (Signed)
2 RN's tried for IV access- unable to obtain.   Explained to pt and daughter that we are unable to infuse, verbalized understanding. Message to Dr Janese Banks (covering for Dr Tasia Catchings that we did not infuse pt

## 2021-03-11 ENCOUNTER — Ambulatory Visit: Admission: RE | Admit: 2021-03-11 | Payer: Medicare Other | Source: Ambulatory Visit

## 2021-03-13 ENCOUNTER — Telehealth: Payer: Self-pay | Admitting: *Deleted

## 2021-03-13 NOTE — Telephone Encounter (Addendum)
[name], daughter called reporting that she got patient MRI rescheduled but it is not until 12/9 and is asking if that is ok or can we move it up sooner. Please advise

## 2021-03-14 ENCOUNTER — Encounter: Payer: Self-pay | Admitting: Oncology

## 2021-03-14 ENCOUNTER — Other Ambulatory Visit: Payer: Self-pay

## 2021-03-14 ENCOUNTER — Other Ambulatory Visit
Admission: RE | Admit: 2021-03-14 | Discharge: 2021-03-14 | Disposition: A | Discharge status: Home / Self Care | Payer: Medicare Other | Source: Ambulatory Visit | Attending: Obstetrics and Gynecology | Admitting: Obstetrics and Gynecology

## 2021-03-14 NOTE — Telephone Encounter (Signed)
Call returned to [name] and went over doctor response and confirmed with her the location for the MRI and date time.

## 2021-03-14 NOTE — Progress Notes (Signed)
Error

## 2021-03-14 NOTE — Pre-Procedure Instructions (Signed)
TC to patient and her daughters [name] and Elsia Lasota. Patient's daughter [name] said that Tu[place]anna is not well at this time and want to postpone the surgery until after January. Advised to call surgeon's office so they can cancel the surgery.

## 2021-03-16 ENCOUNTER — Telehealth: Payer: Self-pay | Admitting: Oncology

## 2021-03-16 ENCOUNTER — Inpatient Hospital Stay: Payer: Medicare Other

## 2021-03-16 ENCOUNTER — Inpatient Hospital Stay: Payer: Medicare Other | Attending: Oncology

## 2021-03-16 ENCOUNTER — Inpatient Hospital Stay: Payer: Medicare Other | Admitting: Hospice and Palliative Medicine

## 2021-03-16 ENCOUNTER — Other Ambulatory Visit: Payer: Self-pay

## 2021-03-16 VITALS — BP 165/83 | HR 96 | Temp 97.9°F | Resp 18

## 2021-03-16 DIAGNOSIS — D500 Iron deficiency anemia secondary to blood loss (chronic): Secondary | ICD-10-CM | POA: Diagnosis not present

## 2021-03-16 DIAGNOSIS — Z818 Family history of other mental and behavioral disorders: Secondary | ICD-10-CM | POA: Insufficient documentation

## 2021-03-16 DIAGNOSIS — M7989 Other specified soft tissue disorders: Secondary | ICD-10-CM | POA: Insufficient documentation

## 2021-03-16 DIAGNOSIS — E1122 Type 2 diabetes mellitus with diabetic chronic kidney disease: Secondary | ICD-10-CM | POA: Insufficient documentation

## 2021-03-16 DIAGNOSIS — I129 Hypertensive chronic kidney disease with stage 1 through stage 4 chronic kidney disease, or unspecified chronic kidney disease: Secondary | ICD-10-CM | POA: Insufficient documentation

## 2021-03-16 DIAGNOSIS — Z808 Family history of malignant neoplasm of other organs or systems: Secondary | ICD-10-CM | POA: Diagnosis not present

## 2021-03-16 DIAGNOSIS — N1832 Chronic kidney disease, stage 3b: Secondary | ICD-10-CM | POA: Insufficient documentation

## 2021-03-16 DIAGNOSIS — N179 Acute kidney failure, unspecified: Secondary | ICD-10-CM | POA: Diagnosis not present

## 2021-03-16 DIAGNOSIS — K219 Gastro-esophageal reflux disease without esophagitis: Secondary | ICD-10-CM | POA: Diagnosis not present

## 2021-03-16 DIAGNOSIS — N939 Abnormal uterine and vaginal bleeding, unspecified: Secondary | ICD-10-CM | POA: Insufficient documentation

## 2021-03-16 DIAGNOSIS — Z809 Family history of malignant neoplasm, unspecified: Secondary | ICD-10-CM | POA: Insufficient documentation

## 2021-03-16 DIAGNOSIS — Z800 Family history of malignant neoplasm of digestive organs: Secondary | ICD-10-CM | POA: Diagnosis not present

## 2021-03-16 DIAGNOSIS — Z5112 Encounter for antineoplastic immunotherapy: Secondary | ICD-10-CM | POA: Diagnosis present

## 2021-03-16 DIAGNOSIS — Z79899 Other long term (current) drug therapy: Secondary | ICD-10-CM | POA: Insufficient documentation

## 2021-03-16 DIAGNOSIS — Z833 Family history of diabetes mellitus: Secondary | ICD-10-CM | POA: Insufficient documentation

## 2021-03-16 DIAGNOSIS — E8581 Light chain (AL) amyloidosis: Secondary | ICD-10-CM | POA: Insufficient documentation

## 2021-03-16 DIAGNOSIS — D631 Anemia in chronic kidney disease: Secondary | ICD-10-CM | POA: Insufficient documentation

## 2021-03-16 DIAGNOSIS — R5383 Other fatigue: Secondary | ICD-10-CM | POA: Insufficient documentation

## 2021-03-16 DIAGNOSIS — E859 Amyloidosis, unspecified: Secondary | ICD-10-CM | POA: Insufficient documentation

## 2021-03-16 DIAGNOSIS — D472 Monoclonal gammopathy: Secondary | ICD-10-CM | POA: Insufficient documentation

## 2021-03-16 DIAGNOSIS — Z5111 Encounter for antineoplastic chemotherapy: Secondary | ICD-10-CM | POA: Insufficient documentation

## 2021-03-16 LAB — RETIC PANEL
Immature Retic Fract: 39.4 % — ABNORMAL HIGH (ref 2.3–15.9)
RBC.: 3.62 MIL/uL — ABNORMAL LOW (ref 3.87–5.11)
Retic Count, Absolute: 159.6 10*3/uL (ref 19.0–186.0)
Retic Ct Pct: 4.4 % — ABNORMAL HIGH (ref 0.4–3.1)
Reticulocyte Hemoglobin: 26.4 pg — ABNORMAL LOW (ref >27.9)

## 2021-03-16 LAB — CBC WITH DIFFERENTIAL/PLATELET
Abs Immature Granulocytes: 0.11 10*3/uL — ABNORMAL HIGH (ref 0.00–0.07)
Basophils Absolute: 0 10*3/uL (ref 0.0–0.1)
Basophils Relative: 0 %
Eosinophils Absolute: 0 10*3/uL (ref 0.0–0.5)
Eosinophils Relative: 0 %
HCT: 29.6 % — ABNORMAL LOW (ref 36.0–46.0)
Hemoglobin: 9.6 g/dL — ABNORMAL LOW (ref 12.0–15.0)
Immature Granulocytes: 1 %
Lymphocytes Relative: 6 %
Lymphs Abs: 0.6 10*3/uL — ABNORMAL LOW (ref 0.7–4.0)
MCH: 25.7 pg — ABNORMAL LOW (ref 26.0–34.0)
MCHC: 32.4 g/dL (ref 30.0–36.0)
MCV: 79.4 fL — ABNORMAL LOW (ref 80.0–100.0)
Monocytes Absolute: 0.1 10*3/uL (ref 0.1–1.0)
Monocytes Relative: 1 %
Neutro Abs: 8.9 10*3/uL — ABNORMAL HIGH (ref 1.7–7.7)
Neutrophils Relative %: 92 %
Platelets: 481 10*3/uL — ABNORMAL HIGH (ref 150–400)
RBC: 3.73 MIL/uL — ABNORMAL LOW (ref 3.87–5.11)
RDW: 26.4 % — ABNORMAL HIGH (ref 11.5–15.5)
WBC: 9.7 10*3/uL (ref 4.0–10.5)
nRBC: 0.4 % — ABNORMAL HIGH (ref 0.0–0.2)

## 2021-03-16 MED ORDER — IRON SUCROSE 20 MG/ML IV SOLN
200.0000 mg | Freq: Once | INTRAVENOUS | Status: AC
  Administered 2021-03-16: 200 mg via INTRAVENOUS
  Filled 2021-03-16: qty 10

## 2021-03-16 MED ORDER — SODIUM CHLORIDE 0.9 % IV SOLN: 200.0000 mg | Freq: Once | INTRAVENOUS | Status: DC

## 2021-03-16 MED ORDER — SODIUM CHLORIDE 0.9 % IV SOLN
Freq: Once | INTRAVENOUS | Status: AC
  Filled 2021-03-16: qty 250

## 2021-03-16 NOTE — Telephone Encounter (Signed)
Daughter called to cancel pt;s appt for today. Please give her a call back at [phone]

## 2021-03-16 NOTE — Patient Instructions (Signed)
[facility]  Discharge Instructions: ?Thank you for choosing [facility] to provide your oncology and hematology care.  ?If you have a lab appointment with the Cancer Center, please go directly to the Cancer Center and check in at the registration area. ? ?Wear comfortable clothing and clothing appropriate for easy access to any Portacath or PICC line.  ? ?We strive to give you quality time with your provider. You may need to reschedule your appointment if you arrive late (15 or more minutes).  Arriving late affects you and other patients whose appointments are after yours.  Also, if you miss three or more appointments without notifying the office, you may be dismissed from the clinic at the provider?s discretion.    ?  ?For prescription refill requests, have your pharmacy contact our office and allow 72 hours for refills to be completed.   ? ?Today you received the following chemotherapy and/or immunotherapy agents VENOFER    ?  ?To help prevent nausea and vomiting after your treatment, we encourage you to take your nausea medication as directed. ? ?BELOW ARE SYMPTOMS THAT SHOULD BE REPORTED IMMEDIATELY: ?*FEVER GREATER THAN 100.4 F (38 ?C) OR HIGHER ?*CHILLS OR SWEATING ?*NAUSEA AND VOMITING THAT IS NOT CONTROLLED WITH YOUR NAUSEA MEDICATION ?*UNUSUAL SHORTNESS OF BREATH ?*UNUSUAL BRUISING OR BLEEDING ?*URINARY PROBLEMS (pain or burning when urinating, or frequent urination) ?*BOWEL PROBLEMS (unusual diarrhea, constipation, pain near the anus) ?TENDERNESS IN MOUTH AND THROAT WITH OR WITHOUT PRESENCE OF ULCERS (sore throat, sores in mouth, or a toothache) ?UNUSUAL RASH, SWELLING OR PAIN  ?UNUSUAL VAGINAL DISCHARGE OR ITCHING  ? ?Items with * indicate a potential emergency and should be followed up as soon as possible or go to the Emergency Department if any problems should occur. ? ?Please show the CHEMOTHERAPY ALERT CARD or IMMUNOTHERAPY ALERT CARD at check-in to the  Emergency Department and triage nurse. ? ?Should you have questions after your visit or need to cancel or reschedule your appointment, please contact [facility]  [phone] and follow the prompts.  Office hours are 8:00 a.m. to 4:30 p.m. Monday - Friday. Please note that voicemails left after 4:00 p.m. may not be returned until the following business day.  We are closed weekends and major holidays. You have access to a nurse at all times for urgent questions. Please call the main number to the clinic [phone] and follow the prompts. ? ?For any non-urgent questions, you may also contact your provider using MyChart. We now offer e-Visits for anyone 18 and older to request care online for non-urgent symptoms. For details visit [email or web address]. ?  ?Also download the MyChart app! Go to the app store, search "MyChart", open the app, select [facility], and log in with your MyChart username and password. ? ?Due to Covid, a mask is required upon entering the hospital/clinic. If you do not have a mask, one will be given to you upon arrival. For doctor visits, patients may have 1 support person aged 18 or older with them. For treatment visits, patients cannot have anyone with them due to current Covid guidelines and our immunocompromised population.  ? ?Iron Sucrose Injection ?What is this medication? ?IRON SUCROSE (EYE ern SOO krose) treats low levels of iron (iron deficiency anemia) in people with kidney disease. Iron is a mineral that plays an important role in making red blood cells, which carry oxygen from your lungs to the rest of your body. ?This medicine may   be used for other purposes; ask your health care provider or pharmacist if you have questions. ?COMMON BRAND NAME(S): Venofer ?What should I tell my care team before I take this medication? ?They need to know if you have any of these conditions: ?Anemia not caused by low iron levels ?Heart disease ?High levels of  iron in the blood ?Kidney disease ?Liver disease ?An unusual or allergic reaction to iron, other medications, foods, dyes, or preservatives ?Pregnant or trying to get pregnant ?Breast-feeding ?How should I use this medication? ?This medication is for infusion into a vein. It is given in a hospital or clinic setting. ?Talk to your care team about the use of this medication in children. While this medication may be prescribed for children as young as 2 years for selected conditions, precautions do apply. ?Overdosage: If you think you have taken too much of this medicine contact a poison control center or emergency room at once. ?NOTE: This medicine is only for you. Do not share this medicine with others. ?What if I miss a dose? ?It is important not to miss your dose. Call your care team if you are unable to keep an appointment. ?What may interact with this medication? ?Do not take this medication with any of the following: ?Deferoxamine ?Dimercaprol ?Other iron products ?This medication may also interact with the following: ?Chloramphenicol ?Deferasirox ?This list may not describe all possible interactions. Give your health care provider a list of all the medicines, herbs, non-prescription drugs, or dietary supplements you use. Also tell them if you smoke, drink alcohol, or use illegal drugs. Some items may interact with your medicine. ?What should I watch for while using this medication? ?Visit your care team regularly. Tell your care team if your symptoms do not start to get better or if they get worse. You may need blood work done while you are taking this medication. ?You may need to follow a special diet. Talk to your care team. Foods that contain iron include: whole grains/cereals, dried fruits, beans, or peas, leafy green vegetables, and organ meats (liver, kidney). ?What side effects may I notice from receiving this medication? ?Side effects that you should report to your care team as soon as  possible: ?Allergic reactions--skin rash, itching, hives, swelling of the face, lips, tongue, or throat ?Low blood pressure--dizziness, feeling faint or lightheaded, blurry vision ?Shortness of breath ?Side effects that usually do not require medical attention (report to your care team if they continue or are bothersome): ?Flushing ?Headache ?Joint pain ?Muscle pain ?Nausea ?Pain, redness, or irritation at injection site ?This list may not describe all possible side effects. Call your doctor for medical advice about side effects. You may report side effects to FDA at 1-800-FDA-1088. ?Where should I keep my medication? ?This medication is given in a hospital or clinic and will not be stored at home. ?NOTE: This sheet is a summary. It may not cover all possible information. If you have questions about this medicine, talk to your doctor, pharmacist, or health care provider. ?? 2022 Elsevier/Gold Standard (2020-08-26 00:00:00) ? ?

## 2021-03-20 ENCOUNTER — Encounter: Admission: RE | Payer: Self-pay | Source: Home / Self Care

## 2021-03-20 ENCOUNTER — Ambulatory Visit
Admission: RE | Admit: 2021-03-20 | Payer: Medicare Other | Source: Home / Self Care | Admitting: Obstetrics and Gynecology

## 2021-03-20 SURGERY — DILATATION AND CURETTAGE /HYSTEROSCOPY
Anesthesia: Choice

## 2021-03-23 ENCOUNTER — Inpatient Hospital Stay: Payer: Medicare Other

## 2021-03-23 ENCOUNTER — Other Ambulatory Visit: Payer: Self-pay

## 2021-03-23 VITALS — BP 166/81 | HR 100 | Temp 98.9°F | Resp 18

## 2021-03-23 DIAGNOSIS — Z5112 Encounter for antineoplastic immunotherapy: Secondary | ICD-10-CM | POA: Diagnosis not present

## 2021-03-23 DIAGNOSIS — D500 Iron deficiency anemia secondary to blood loss (chronic): Secondary | ICD-10-CM

## 2021-03-23 MED ORDER — IRON SUCROSE 20 MG/ML IV SOLN
200.0000 mg | Freq: Once | INTRAVENOUS | Status: AC
  Administered 2021-03-23: 200 mg via INTRAVENOUS
  Filled 2021-03-23: qty 10

## 2021-03-23 MED ORDER — SODIUM CHLORIDE 0.9 % IV SOLN: 200.0000 mg | Freq: Once | INTRAVENOUS | Status: DC

## 2021-03-23 MED ORDER — SODIUM CHLORIDE 0.9 % IV SOLN
Freq: Once | INTRAVENOUS | Status: AC
  Filled 2021-03-23: qty 250

## 2021-03-23 NOTE — Progress Notes (Signed)
Pt received IV venofer in clinic today. VSS @ d/c. 

## 2021-03-23 NOTE — Patient Instructions (Signed)
[facility] [place]   Discharge Instructions: Thank you for choosing [facility] to provide your oncology and hematology care.  If you have a lab appointment with the [facility], please go directly to the [facility] and check in at the registration area.  Wear comfortable clothing and clothing appropriate for easy access to any Portacath or PICC line.   We strive to give you quality time with your provider. You may need to reschedule your appointment if you arrive late (15 or more minutes).  Arriving late affects you and other patients whose appointments are after yours.  Also, if you miss three or more appointments without notifying the office, you may be dismissed from the clinic at the provider's discretion.      For prescription refill requests, have your pharmacy contact our office and allow 72 hours for refills to be completed.    BELOW ARE SYMPTOMS THAT SHOULD BE REPORTED IMMEDIATELY: *FEVER GREATER THAN 100.4 F (38 C) OR HIGHER *CHILLS OR SWEATING *NAUSEA AND VOMITING THAT IS NOT CONTROLLED WITH YOUR NAUSEA MEDICATION *UNUSUAL SHORTNESS OF BREATH *UNUSUAL BRUISING OR BLEEDING *URINARY PROBLEMS (pain or burning when urinating, or frequent urination) *BOWEL PROBLEMS (unusual diarrhea, constipation, pain near the anus) TENDERNESS IN MOUTH AND THROAT WITH OR WITHOUT PRESENCE OF ULCERS (sore throat, sores in mouth, or a toothache) UNUSUAL RASH, SWELLING OR PAIN  UNUSUAL VAGINAL DISCHARGE OR ITCHING   Items with * indicate a potential emergency and should be followed up as soon as possible or go to the Emergency Department if any problems should occur.  Please show the CHEMOTHERAPY ALERT CARD or IMMUNOTHERAPY ALERT CARD at check-in to the Emergency Department and triage nurse. Iron Sucrose Injection What is this medication? IRON SUCROSE (EYE ern SOO krose) treats low levels of iron (iron deficiency anemia) in people with kidney disease.  Iron is a mineral that plays an important role in making red blood cells, which carry oxygen from your lungs to the rest of your body. This medicine may be used for other purposes; ask your health care provider or pharmacist if you have questions. COMMON BRAND NAME(S): Venofer What should I tell my care team before I take this medication? They need to know if you have any of these conditions: Anemia not caused by low iron levels Heart disease High levels of iron in the blood Kidney disease Liver disease An unusual or allergic reaction to iron, other medications, foods, dyes, or preservatives Pregnant or trying to get pregnant Breast-feeding How should I use this medication? This medication is for infusion into a vein. It is given in a hospital or clinic setting. Talk to your care team about the use of this medication in children. While this medication may be prescribed for children as young as 2 years for selected conditions, precautions do apply. Overdosage: If you think you have taken too much of this medicine contact a poison control center or emergency room at once. NOTE: This medicine is only for you. Do not share this medicine with others. What if I miss a dose? It is important not to miss your dose. Call your care team if you are unable to keep an appointment. What may interact with this medication? Do not take this medication with any of the following: Deferoxamine Dimercaprol Other iron products This medication may also interact with the following: Chloramphenicol Deferasirox This list may not describe all possible interactions. Give your health care provider a list of all the medicines,  herbs, non-prescription drugs, or dietary supplements you use. Also tell them if you smoke, drink alcohol, or use illegal drugs. Some items may interact with your medicine. What should I watch for while using this medication? Visit your care team regularly. Tell your care team if your symptoms  do not start to get better or if they get worse. You may need blood work done while you are taking this medication. You may need to follow a special diet. Talk to your care team. Foods that contain iron include: whole grains/cereals, dried fruits, beans, or peas, leafy green vegetables, and organ meats (liver, kidney). What side effects may I notice from receiving this medication? Side effects that you should report to your care team as soon as possible: Allergic reactions--skin rash, itching, hives, swelling of the face, lips, tongue, or throat Low blood pressure--dizziness, feeling faint or lightheaded, blurry vision Shortness of breath Side effects that usually do not require medical attention (report to your care team if they continue or are bothersome): Flushing Headache Joint pain Muscle pain Nausea Pain, redness, or irritation at injection site This list may not describe all possible side effects. Call your doctor for medical advice about side effects. You may report side effects to FDA at 1-800-FDA-1088. Where should I keep my medication? This medication is given in a hospital or clinic and will not be stored at home. NOTE: This sheet is a summary. It may not cover all possible information. If you have questions about this medicine, talk to your doctor, pharmacist, or health care provider.  2022 Elsevier/Gold Standard (2020-08-26 00:00:00)  Should you have questions after your visit or need to cancel or reschedule your appointment, please contact [facility] [place] [facility] [place]  [phone] and follow the prompts.  Office hours are 8:00 a.m. to 4:30 p.m. Monday - Friday. Please note that voicemails left after 4:00 p.m. may not be returned until the following business day.  We are closed weekends and major holidays. You have access to a nurse at all times for urgent questions. Please call the main number to the clinic [phone] and follow the prompts.  For any  non-urgent questions, you may also contact your provider using MyChart. We now offer e-Visits for anyone 63 and older to request care online for non-urgent symptoms. For details visit [email or web address].   Also download the MyChart app! Go to the app store, search "MyChart", open the app, select [facility], and log in with your MyChart username and password.  Due to Covid, a mask is required upon entering the hospital/clinic. If you do not have a mask, one will be given to you upon arrival. For doctor visits, patients may have 1 support person aged 61 or older with them. For treatment visits, patients cannot have anyone with them due to current Covid guidelines and our immunocompromised population.

## 2021-03-24 ENCOUNTER — Ambulatory Visit: Payer: Medicare Other

## 2021-03-30 ENCOUNTER — Inpatient Hospital Stay: Payer: Medicare Other

## 2021-03-30 ENCOUNTER — Other Ambulatory Visit: Payer: Self-pay

## 2021-03-30 ENCOUNTER — Encounter: Payer: Self-pay | Admitting: Oncology

## 2021-03-30 ENCOUNTER — Inpatient Hospital Stay: Payer: Medicare Other | Admitting: Pharmacist

## 2021-03-30 ENCOUNTER — Inpatient Hospital Stay (HOSPITAL_BASED_OUTPATIENT_CLINIC_OR_DEPARTMENT_OTHER): Payer: Medicare Other | Admitting: Oncology

## 2021-03-30 VITALS — BP 166/89 | HR 102 | Resp 20

## 2021-03-30 VITALS — BP 153/71 | HR 100 | Temp 98.0°F | Wt 133.0 lb

## 2021-03-30 DIAGNOSIS — E8581 Light chain (AL) amyloidosis: Secondary | ICD-10-CM

## 2021-03-30 DIAGNOSIS — N939 Abnormal uterine and vaginal bleeding, unspecified: Secondary | ICD-10-CM

## 2021-03-30 DIAGNOSIS — D631 Anemia in chronic kidney disease: Secondary | ICD-10-CM

## 2021-03-30 DIAGNOSIS — Z5112 Encounter for antineoplastic immunotherapy: Secondary | ICD-10-CM | POA: Diagnosis not present

## 2021-03-30 DIAGNOSIS — D500 Iron deficiency anemia secondary to blood loss (chronic): Secondary | ICD-10-CM | POA: Diagnosis not present

## 2021-03-30 DIAGNOSIS — E859 Amyloidosis, unspecified: Secondary | ICD-10-CM

## 2021-03-30 DIAGNOSIS — R7989 Other specified abnormal findings of blood chemistry: Secondary | ICD-10-CM

## 2021-03-30 DIAGNOSIS — R634 Abnormal weight loss: Secondary | ICD-10-CM

## 2021-03-30 DIAGNOSIS — Z5111 Encounter for antineoplastic chemotherapy: Secondary | ICD-10-CM

## 2021-03-30 DIAGNOSIS — N1831 Chronic kidney disease, stage 3a: Secondary | ICD-10-CM | POA: Diagnosis not present

## 2021-03-30 LAB — COMPREHENSIVE METABOLIC PANEL
ALT: 49 U/L — ABNORMAL HIGH (ref 0–44)
AST: 48 U/L — ABNORMAL HIGH (ref 15–41)
Albumin: 2.2 g/dL — ABNORMAL LOW (ref 3.5–5.0)
Alkaline Phosphatase: 931 U/L — ABNORMAL HIGH (ref 38–126)
Anion gap: 12 (ref 5–15)
BUN: 26 mg/dL — ABNORMAL HIGH (ref 8–23)
CO2: 19 mmol/L — ABNORMAL LOW (ref 22–32)
Calcium: 8.7 mg/dL — ABNORMAL LOW (ref 8.9–10.3)
Chloride: 108 mmol/L (ref 98–111)
Creatinine, Ser: 0.93 mg/dL (ref 0.44–1.00)
GFR, Estimated: >60 mL/min (ref >60)
Glucose, Bld: 260 mg/dL — ABNORMAL HIGH (ref 70–99)
Potassium: 3.8 mmol/L (ref 3.5–5.1)
Sodium: 139 mmol/L (ref 135–145)
Total Bilirubin: 2.7 mg/dL — ABNORMAL HIGH (ref 0.3–1.2)
Total Protein: 5.5 g/dL — ABNORMAL LOW (ref 6.5–8.1)

## 2021-03-30 LAB — CBC WITH DIFFERENTIAL/PLATELET
Abs Immature Granulocytes: 0.05 10*3/uL (ref 0.00–0.07)
Basophils Absolute: 0.1 10*3/uL (ref 0.0–0.1)
Basophils Relative: 1 %
Eosinophils Absolute: 0.1 10*3/uL (ref 0.0–0.5)
Eosinophils Relative: 2 %
HCT: 28.2 % — ABNORMAL LOW (ref 36.0–46.0)
Hemoglobin: 9.5 g/dL — ABNORMAL LOW (ref 12.0–15.0)
Immature Granulocytes: 1 %
Lymphocytes Relative: 22 %
Lymphs Abs: 1.8 10*3/uL (ref 0.7–4.0)
MCH: 26.5 pg (ref 26.0–34.0)
MCHC: 33.7 g/dL (ref 30.0–36.0)
MCV: 78.6 fL — ABNORMAL LOW (ref 80.0–100.0)
Monocytes Absolute: 0.8 10*3/uL (ref 0.1–1.0)
Monocytes Relative: 9 %
Neutro Abs: 5.5 10*3/uL (ref 1.7–7.7)
Neutrophils Relative %: 65 %
Platelets: 383 10*3/uL (ref 150–400)
RBC: 3.59 MIL/uL — ABNORMAL LOW (ref 3.87–5.11)
RDW: 25.3 % — ABNORMAL HIGH (ref 11.5–15.5)
WBC: 8.3 10*3/uL (ref 4.0–10.5)
nRBC: 0 % (ref 0.0–0.2)

## 2021-03-30 MED ORDER — ACETAMINOPHEN 325 MG PO TABS
325.0000 mg | ORAL_TABLET | Freq: Once | ORAL | Status: AC
  Administered 2021-03-30: 325 mg via ORAL
  Filled 2021-03-30: qty 1

## 2021-03-30 MED ORDER — DEXAMETHASONE 4 MG PO TABS
20.0000 mg | ORAL_TABLET | Freq: Once | ORAL | Status: AC
  Administered 2021-03-30: 20 mg via ORAL
  Filled 2021-03-30: qty 5

## 2021-03-30 MED ORDER — DIPHENHYDRAMINE HCL 25 MG PO CAPS
50.0000 mg | ORAL_CAPSULE | Freq: Once | ORAL | Status: AC
  Administered 2021-03-30: 50 mg via ORAL
  Filled 2021-03-30: qty 2

## 2021-03-30 MED ORDER — DARATUMUMAB-HYALURONIDASE-FIHJ 1800-30000 MG-UT/15ML ~~LOC~~ SOLN
1800.0000 mg | Freq: Once | SUBCUTANEOUS | Status: AC
  Administered 2021-03-30: 1800 mg via SUBCUTANEOUS
  Filled 2021-03-30: qty 15

## 2021-03-30 MED ORDER — LENALIDOMIDE 10 MG PO CAPS: 10.0000 mg | ORAL_CAPSULE | Freq: Every day | ORAL | 0 refills | Status: DC

## 2021-03-30 MED ORDER — SODIUM CHLORIDE 0.9 % IV SOLN
Freq: Once | INTRAVENOUS | Status: AC
  Filled 2021-03-30: qty 250

## 2021-03-30 MED ORDER — IRON SUCROSE 20 MG/ML IV SOLN
200.0000 mg | Freq: Once | INTRAVENOUS | Status: AC
  Administered 2021-03-30: 200 mg via INTRAVENOUS
  Filled 2021-03-30: qty 10

## 2021-03-30 MED ORDER — SODIUM CHLORIDE 0.9 % IV SOLN: 200.0000 mg | Freq: Once | INTRAVENOUS | Status: DC

## 2021-03-30 NOTE — Progress Notes (Signed)
Hematology/Oncology  Follow up note [phone] [phone]   Patient Care Team: Nancy Ramsay, MD as PCP - General (Infectious Diseases) End, Nancy Gave, MD as PCP - Cardiology (Cardiology)  REFERRING PROVIDER: Leonel Ramsay, MD  CHIEF COMPLAINTS/REASON FOR VISIT:  Follow-up for amyloidosis  HISTORY OF PRESENTING ILLNESS:   Nancy Rodriguez is a  75 y.o.  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  Work up include protein electrophoresis showed M protein 0.7,   Reviewed her previous medical records via care everywhere.  She was seen by Hematology Oncology at [facility] on 02/14/2017.  07/25/2007  SPEP showed M protein of 0.35, IFE showed IgG lamda 09/22/2007  Bone survey negative.  02/15/14 IgG 930, SPEP M protien 0.22, free kappa light chain 3.59, lamda 2.92, ratio 1.23  Hypercalcemia, resolved after stopping HCTZ.  # Transaminitis 03/28/20 US abdomen showed left liver lobe Complex 1.7 cm cyst  # 04/07/20 MRI abdomen w/wo contrast showed 2 benign liver cysts, 2 nonspecific hypovascular irighr liver lesions, abnormal bone marrow signal of L1 vertebral body. Patient was  advised to proceed with bone marrow biopsy and she declined.  # referred her to established care with GI and was seen on 04/19/20,  # Liver biopsy showed amyloidosis. [facility] MS/MS showed AL type # 05/20/20 recommend bone marrow biopsy which is scheduled on 05/24/2020.  Patient changed her mind and ask a biopsy to be canceled.  My team and I had multiple phone discussion with patient's daughter Nancy Rodriguez and later with the patient's son Nancy Rodriguez.  Patient agreed with bone marrow biopsy and a biopsy was scheduled on 06/09/2020.  05/20/20 NT proBNP  was normal,  TSH slightly increased, normal T4 Normal factor X Normal  coags Troponin 19. Refer to cardiology for evaluation. May need cardiac MRI  # 06/01/2020 2D echo result was reviewed and discussed with patient.  Patient has normal LVEF 60-65%. Mild asymmetric left ventricular hypertrophy.  Left ventricular diastolic parameters are indeterminate. average left ventricular global longitudinal strain is -16.8 %.  # 06/09/2020, bone marrow biopsy showed monoclonal plasmacytosis, 8%, amyloid deposit present.  Absent iron stores. 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 t (14;20) #06/17/2020, further discussed about diagnosis and treatment plan.  Patient declined bone marrow transplant evaluation.  Decision was made to proceed with Dara-CyborD chemotherapy treatments. #May 2022, second opinion at [place] with Dr. Tracey Rodriguez.who agrees with current treatment plan.  He recommends to lower dexamethasone to 20 mg and decrease premed Tylenol to 325 mg  09/01/2020 cardiac MRI was performed at [facility].  Left ventricle is normal in size.  Mild to moderate basal septal hypertrophy.  Hyper dynamic LV function.  LVEF 71%.  Right ventricle is normal in size and wall thickness.  Systolic function normal.  Mild bi atrial enlargement, no significant aortic valve stenosis or regurgitation.  Trivial mitral regurgitation, mild tricuspid regurgitation and a trivial pulmonic regurgitation.  No evidence of MI, scarring or infiltration.  #History of major depression listed in outside problem list.  previous diagnosis and treatment details are not available to me in EMR  I have referred her to psychiatrist  #  vaginal bleeding which stopped- she was seen by GYN and recommended for biopsy.   # She has poor IV access and was not able  to receive IV Emend and IV Aloxi. She does not want to have med port.   # GERD   Dr. Ola Rodriguez has increased her omeprazole to 40 mg daily. She feels symptoms are improved.  # 10/08/2020, cervical spine showed cervical  spondylosis.  Spinal canal diagnosis and a posterior disc osteophyte complex and superimposed small central disc protrusion contact the ventral spinal cord at C4-C5.  Multilevel neuroforaminal narrowing. bilateral atlantooccipital joint effusions, multilevel grade 1 spondylolisthesis. MRI lumbar spine without contrast showed acute or subacute fracture of L4.   Moderate spinal stenosis at L3-4 has progressed. Shallow left foraminal disc protrusion has progressed. Moderate subarticular stenosis on the left also with progression  Moderate spinal stenosis L4-5 with progression. Moderate subarticular stenosis on the left with progression  # 10/25/2020 seen by Nancy Rodriguez who recommend patient to switch Dara CyBorD to Dara RD  # 11/03/2020 Revlimid (lenalidomide) 10 mg by mouth daily for 14 days, then hold for 7 days. Pt was started on a shortened cycle in order to remain on similar schedule with infusion.   # 11/22/2020 seen by hepatologist Nancy Rodriguez. Recommended to start Zoloft $RemoveBefo'50mg'oBVjSaEYSoi$  daily for pruritis # 11/24/2020 cycle 2 Revlimid 10 mg, she finished 14 days.  Rest of the course was held due to anemia.  Patient declined blood transfusion.  # Vaginal bleeding  patient was seen by Dr. Leafy Rodriguez,  on megace $RemoveB'40mg'MuRmXLUz$  daily. 12/22/2020 repeat EMBx - not diagnostic due to insufficient squamous component.   INTERVAL HISTORY Nancy Rodriguez is a 75 y.o. female who has above history reviewed by me today presents for follow up visit for management of AL amyloidosis Problems and complaints are listed below: Continues to have vaginal bleeding.  Patient was accompanied by her daughter today.  Per patient, GYN Dr. Leafy Rodriguez recommends patient to have another D&C and the patient is undecided.  She received IV Venofer treatments during the interval. She reports not feeling so well today.  More fatigued, chronic leg swelling.  No nausea vomiting diarrhea. During last visit, patient reports abdomen  distention. 03/03/2021, ultrasound abdomen complete showed possible hypoechoic mass in the pancreatic tail.  1.8 x 1.2 x 1.4 cm.  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  Cholelithiasis.  MRI has been ordered for further evaluation and is scheduled. Due to anemia, Revlimid was held for the last cycle.  Patient received Daratumumab on 03/02/2021.  Review of Systems  Constitutional:  Positive for fatigue. Negative for appetite change, chills, fever and unexpected weight change.  HENT:   Negative for hearing loss, nosebleeds and voice change.   Eyes:  Positive for icterus. Negative for eye problems.  Respiratory:  Negative for chest tightness and cough.   Cardiovascular:  Positive for leg swelling. Negative for chest pain.  Gastrointestinal:  Negative for abdominal distention, abdominal pain, blood in stool and nausea.  Endocrine: Negative for hot flashes.  Genitourinary:  Negative for difficulty urinating and frequency.   Musculoskeletal:  Positive for back pain. Negative for arthralgias.  Skin:  Negative for rash.       jaundice  Neurological:  Positive for numbness. Negative for extremity weakness.  Hematological:  Negative for adenopathy.  Psychiatric/Behavioral:  Negative for confusion.    MEDICAL HISTORY:  Past Medical History:  Diagnosis Date   Anemia    Anemia in chronic kidney disease   Chronic kidney disease    Stage 3b chronic kidney disease   Diabetes mellitus without complication (HCC)    Hypercholesterolemia  Hypertension    MGUS (monoclonal gammopathy of unknown significance)    Osteoarthritis    Rheumatoid arthritis ([place])     SURGICAL HISTORY: Past Surgical History:  Procedure Laterality Date   HERNIA REPAIR     PARATHYROIDECTOMY      SOCIAL HISTORY: Social History   Socioeconomic History   Marital status: Widowed    Spouse name: Not on file   Number of children: 6   Years of education: Not on file   Highest education  level: Not on file  Occupational History   Not on file  Tobacco Use   Smoking status: Never   Smokeless tobacco: Never  Vaping Use   Vaping Use: Never used  Substance and Sexual Activity   Alcohol use: No   Drug use: Never   Sexual activity: Not on file  Other Topics Concern   Not on file  Social History Narrative   Not on file   Social Determinants of Health   Financial Resource Strain: Not on file  Food Insecurity: Not on file  Transportation Needs: Not on file  Physical Activity: Not on file  Stress: Not on file  Social Connections: Not on file  Intimate Partner Violence: Not on file    FAMILY HISTORY: Family History  Problem Relation Age of Onset   Diabetes Daughter    Diabetes Son    Dementia Mother    Cancer Father    Esophageal cancer Sister    Brain cancer Brother     ALLERGIES:  is allergic to benazepril, lisinopril, tolmetin, and nsaids.  MEDICATIONS:  Current Outpatient Medications  Medication Sig Dispense Refill   albuterol (VENTOLIN HFA) 108 (90 Base) MCG/ACT inhaler Inhale 2 puffs into the lungs every 4 (four) hours as needed for wheezing or shortness of breath.     amLODipine (NORVASC) 5 MG tablet Take 5 mg by mouth daily.     Ascorbic Acid (VITAMIN C) 500 MG CAPS Take 500 mcg by mouth daily. 30 capsule 1   aspirin EC 81 MG tablet Take 1 tablet (81 mg total) by mouth daily. Swallow whole. 30 tablet 0   Continuous Blood Gluc Sensor (FREESTYLE LIBRE 14 DAY SENSOR) MISC SMARTSIG:1 Kit(s) Topical Every 2 Weeks     dexamethasone (DECADRON) 4 MG tablet Take 5 tablets (20 mg total) by mouth once a week. 60 tablet 0   EPINEPHrine 0.3 mg/0.3 mL IJ SOAJ injection Inject 0.3 mg into the muscle as needed.     gabapentin (NEURONTIN) 100 MG capsule Take 100 mg by mouth at bedtime.     hydrOXYzine (ATARAX/VISTARIL) 10 MG tablet Take 10 mg by mouth 2 (two) times daily as needed for itching.     insulin aspart (NOVOLOG FLEXPEN) 100 UNIT/ML FlexPen Inject 10 Units  into the skin every evening.     insulin glargine (LANTUS SOLOSTAR) 100 UNIT/ML Solostar Pen Inject 50 Units into the skin daily. Pt takes in morning and bedtime.     megestrol (MEGACE) 40 MG tablet Take 40 mg by mouth 2 (two) times daily.     metFORMIN (GLUCOPHAGE) 500 MG tablet Take 500 mg by mouth every evening.     omeprazole (PRILOSEC) 40 MG capsule Take 40 mg by mouth daily.     sertraline (ZOLOFT) 25 MG tablet Take 25 mg by mouth daily.     acyclovir (ZOVIRAX) 400 MG tablet Take 1 tablet (400 mg total) by mouth 2 (two) times daily. (Patient not taking: Reported on 03/30/2021) 60 tablet 5  ferrous sulfate 325 (65 FE) MG tablet Take 1 tablet (325 mg total) by mouth 3 (three) times daily with meals. (Patient not taking: Reported on 03/30/2021) 90 tablet 3   furosemide (LASIX) 20 MG tablet TAKE 2 TABLETS BY MOUTH DAILY. TAKE 1 TABLET DAILY FOR 3 DAYS OR UNTIL SWELLING IMPROVES (Patient not taking: Reported on 03/30/2021) 60 tablet 3   KLOR-CON M20 20 MEQ tablet TAKE 2 TABLETS BY MOUTH TWICE A DAY (Patient not taking: Reported on 03/30/2021) 120 tablet 0   lenalidomide (REVLIMID) 10 MG capsule Take 1 capsule (10 mg total) by mouth daily. Take for 21 days, then hold for 7 days. Repeat every 28 days. 21 capsule 0   LORazepam (ATIVAN) 0.5 MG tablet Take 1 tablet (0.5 mg total) by mouth every 6 (six) hours as needed (nausea vomiting). (Patient not taking: Reported on 03/07/2021) 30 tablet 0   ondansetron (ZOFRAN) 8 MG tablet TAKE 8 MG BY MOUTH 30 TO 60 MIN PRIOR TO CYTOXAN ADMINISTRATION THEN TAKE 8 MG TWICE DAILY AS NEEDED FOR NAUSEA AND VOMITING. (Patient not taking: Reported on 03/07/2021) 30 tablet 1   promethazine (PHENERGAN) 25 MG tablet TAKE 1 TABLET BY MOUTH EVERY 8 HOURS AS NEEDED FOR NAUSEA AND VOMITING (Patient not taking: Reported on 03/07/2021) 90 tablet 1   traMADol (ULTRAM) 50 MG tablet Take 1 tablet (50 mg total) by mouth every 6 (six) hours as needed for severe pain. (Patient not  taking: Reported on 03/07/2021) 8 tablet 0   No current facility-administered medications for this visit.     PHYSICAL EXAMINATION: ECOG PERFORMANCE STATUS: 1 - Symptomatic but completely ambulatory Vitals:   03/30/21 0925  BP: (!) 153/71  Pulse: 100  Temp: 98 F (36.7 C)   Filed Weights   03/30/21 0925  Weight: 133 lb (60.3 kg)    Physical Exam Constitutional:      General: She is not in acute distress. HENT:     Head: Normocephalic and atraumatic.  Eyes:     General: No scleral icterus. Cardiovascular:     Rate and Rhythm: Normal rate and regular rhythm.     Heart sounds: Murmur heard.  Pulmonary:     Effort: Pulmonary effort is normal. No respiratory distress.     Breath sounds: No wheezing.  Abdominal:     General: Bowel sounds are normal. There is distension.     Palpations: Abdomen is soft.  Musculoskeletal:        General: No deformity. Normal range of motion.     Cervical back: Normal range of motion and neck supple.     Comments: Bilateral lower extremities +1 edema  Skin:    General: Skin is warm and dry.     Findings: No erythema or rash.  Neurological:     Mental Status: She is alert and oriented to person, place, and time. Mental status is at baseline.     Cranial Nerves: No cranial nerve deficit.     Coordination: Coordination normal.  Psychiatric:        Mood and Affect: Mood normal.    LABORATORY DATA:  I have reviewed the data as listed Lab Results  Component Value Date   WBC 8.3 03/30/2021   HGB 9.5 (L) 03/30/2021   HCT 28.2 (L) 03/30/2021   MCV 78.6 (L) 03/30/2021   PLT 383 03/30/2021   Recent Labs    02/16/21 0911 03/02/21 0821 03/30/21 0909  NA 131* 134* 139  K 4.5 4.1 3.8  CL 99  110 108  CO2 23 16* 19*  GLUCOSE 375* 377* 260*  BUN 21 24* 26*  CREATININE 1.07* 0.96 0.93  CALCIUM 8.3* 8.3* 8.7*  GFRNONAA 54* >60 >60  PROT 5.3* 5.4* 5.5*  ALBUMIN 2.1* 2.2* 2.2*  AST 35 33 48*  ALT 29 33 49*  ALKPHOS 744* 716* 931*   BILITOT 2.4* 2.2* 2.7*    Iron/TIBC/Ferritin/ %Sat    Component Value Date/Time   IRON 23 (L) 02/16/2021 1042   TIBC 316 02/16/2021 1042   FERRITIN 28 02/16/2021 1042   IRONPCTSAT 7 (L) 02/16/2021 1042     08/25/2019, platelet count 491, WBC 7.5, hemoglobin 12 Creatinine 1.58, EGFR 37, calcium 10.8, albumin 4.2 Negative hepatitis B surface antigen, hepatitis B core antibody, Negative hepatitis C 08/05/2019, A1c 11.2   RADIOGRAPHIC STUDIES: I have personally reviewed the radiological images as listed and agreed with the findings in the report. CT HEAD WO CONTRAST (5MM)  Result Date: 03/03/2021 CLINICAL DATA:  Recent fall, head injury over the left orbit EXAM: CT HEAD WITHOUT CONTRAST TECHNIQUE: Contiguous axial images were obtained from the base of the skull through the vertex without intravenous contrast. COMPARISON:  None. FINDINGS: Brain: Atrophy and minor white matter microvascular ischemic changes. No acute intracranial hemorrhage, mass lesion, definite infarction, midline shift, herniation, or hydrocephalus. Cisterns are patent. Cerebellar atrophy as well. Vascular: No hyperdense vessel or unexpected calcification. Skull: Normal. Negative for fracture or focal lesion. Sinuses/Orbits: No acute finding. Other: None. IMPRESSION: Atrophy and white matter microvascular ischemic changes. No acute intracranial abnormality by noncontrast CT. Electronically Signed   By: Jerilynn Mages.  Shick M.D.   On: 03/03/2021 11:53   US Abdomen Complete  Result Date: 03/03/2021 CLINICAL DATA:  Abdominal distension EXAM: ABDOMEN ULTRASOUND COMPLETE COMPARISON:  None. FINDINGS: Gallbladder: Gallstones. No wall thickening visualized. No sonographic Murphy sign noted by sonographer. Common bile duct: Diameter: 5 mm Liver: No focal lesion identified. Coarse, nodular contour of the liver. Increased parenchymal echogenicity. Portal vein is patent on color Doppler imaging with normal direction of blood flow towards the  liver. IVC: No abnormality visualized. Pancreas: Possible hypoechoic mass in the pancreatic tail measuring 1.8 x 1.2 x 1.4 cm. Spleen: Size and appearance within normal limits. Right Kidney: Length: 10.2 cm. Echogenicity within normal limits. No mass or hydronephrosis visualized. Left Kidney: Length: 10.1 cm. Echogenicity within normal limits. No mass or hydronephrosis visualized. Abdominal aorta: No aneurysm visualized. Other findings: Small volume perihepatic ascites. IMPRESSION: 1. Possible hypoechoic mass in the pancreatic tail measuring 1.8 x 1.2 x 1.4 cm. Recommend multiphasic contrast enhanced pancreatic protocol CT or MRI to further evaluate. 2. 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 3. Cholelithiasis. Electronically Signed   By: Delanna Ahmadi M.D.   On: 03/03/2021 12:22       ASSESSMENT & PLAN:  1. Light chain (AL) amyloidosis (HCC)   2. Iron deficiency anemia due to chronic blood loss   3. Encounter for antineoplastic chemotherapy   4. 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[place])   5. Vaginal bleeding     # AL-Amyloidosis Liver involvement, possible kidney involvement-on second line treatment with daratumumab Revlimid dexamethasone. Labs reviewed and discussed with patient.  Proceed with daratumumab.   Proceed with Revlimid when she receives.  #Iron deficiency anemia due to ongoing vaginal blood loss. Previously received blood transfusion.  She has had IV Venofer treatments. Labs reviewed and discussed with her.  Hemoglobin has improved.  We will proceed with another dose of  IV Venofer today.   #Elevated LFT, due to amyloidosis liver involvement.  Worsened.  I wonder this could be secondary to Revlimid being held.  # uncontrolled DM, weekly Dexamethasone use. Continue follow-up with primary care provider for diabetes management.  # vaginal bleeding. ? Involvedness of amyloidosis vs other etiologies.  I urged patient to further consider  gynecology recommendation about D&C procedure.  This may help reduce/stop vaginal bleeding.0 Repeat PT, PTT, Factor X activity.   We spent sufficient time to discuss many aspect of care, questions were answered to patient's satisfaction.  Lab MD 4 weeks lab MD Daratumumab/ Revlimid/Venofer, also see pallaitve care service.   cc Nancy Ramsay, MD   [name], MD, PhD  03/30/2021

## 2021-03-30 NOTE — Progress Notes (Signed)
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 Currently treated with Revlimid (lenalidomide), daratumumab, and dexamethasone. Patient's lenalidomide was on hold, but she is now okay to resume lenaliomide.  Reason for Consult: Lenalidomide restart   PAST MEDICAL HISTORY: Past Medical History:  Diagnosis Date   Anemia    Anemia in chronic kidney disease   Chronic kidney disease    Stage 3b chronic kidney disease   Diabetes mellitus without complication (HCC)    Hypercholesterolemia    Hypertension    MGUS (monoclonal gammopathy of unknown significance)    Osteoarthritis    Rheumatoid arthritis ([place])     HEMATOLOGY/ONCOLOGY HISTORY:  Oncology History   No history exists.    ALLERGIES:  is allergic to benazepril, lisinopril, tolmetin, and nsaids.  MEDICATIONS:  Current Outpatient Medications  Medication Sig Dispense Refill   acyclovir (ZOVIRAX) 400 MG tablet Take 1 tablet (400 mg total) by mouth 2 (two) times daily. (Patient not taking: Reported on 03/30/2021) 60 tablet 5   albuterol (VENTOLIN HFA) 108 (90 Base) MCG/ACT inhaler Inhale 2 puffs into the lungs every 4 (four) hours as needed for wheezing or shortness of breath.     amLODipine (NORVASC) 5 MG tablet Take 5 mg by mouth daily.     Ascorbic Acid (VITAMIN C) 500 MG CAPS Take 500 mcg by mouth daily. 30 capsule 1   aspirin EC 81 MG tablet Take 1 tablet (81 mg total) by mouth daily. Swallow whole. 30 tablet 0   Continuous Blood Gluc Sensor (FREESTYLE LIBRE 14 DAY SENSOR) MISC SMARTSIG:1 Kit(s) Topical Every 2 Weeks     dexamethasone (DECADRON) 4 MG tablet Take 5 tablets (20 mg total)  by mouth once a week. 60 tablet 0   EPINEPHrine 0.3 mg/0.3 mL IJ SOAJ injection Inject 0.3 mg into the muscle as needed.     ferrous sulfate 325 (65 FE) MG tablet Take 1 tablet (325 mg total) by mouth 3 (three) times daily with meals. (Patient not taking: Reported on 03/30/2021) 90 tablet 3   furosemide (LASIX) 20 MG tablet TAKE 2 TABLETS BY MOUTH DAILY. TAKE 1 TABLET DAILY FOR 3 DAYS OR UNTIL SWELLING IMPROVES (Patient not taking: Reported on 03/30/2021) 60 tablet 3   gabapentin (NEURONTIN) 100 MG capsule Take 100 mg by mouth at bedtime.     hydrOXYzine (ATARAX/VISTARIL) 10 MG tablet Take 10 mg by mouth 2 (two) times daily as needed for itching.     insulin aspart (NOVOLOG FLEXPEN) 100 UNIT/ML FlexPen Inject 10 Units into the skin every evening.     insulin glargine (LANTUS SOLOSTAR) 100 UNIT/ML Solostar Pen Inject 50 Units into the skin daily. Pt takes in morning and bedtime.     KLOR-CON M20 20 MEQ tablet TAKE 2 TABLETS BY MOUTH TWICE A DAY (Patient not taking: Reported on 03/30/2021) 120 tablet 0   lenalidomide (REVLIMID) 10 MG capsule Take 1 capsule (10 mg total) by mouth daily. Take for 21 days, then hold for 7 days. Repeat every 28 days. 21 capsule 0   LORazepam (ATIVAN) 0.5 MG tablet Take 1 tablet (0.5 mg total) by mouth every 6 (six) hours as needed (nausea vomiting). (Patient not taking:  Reported on 03/07/2021) 30 tablet 0   megestrol (MEGACE) 40 MG tablet Take 40 mg by mouth 2 (two) times daily.     metFORMIN (GLUCOPHAGE) 500 MG tablet Take 500 mg by mouth every evening.     omeprazole (PRILOSEC) 40 MG capsule Take 40 mg by mouth daily.     ondansetron (ZOFRAN) 8 MG tablet TAKE 8 MG BY MOUTH 30 TO 60 MIN PRIOR TO CYTOXAN ADMINISTRATION THEN TAKE 8 MG TWICE DAILY AS NEEDED FOR NAUSEA AND VOMITING. (Patient not taking: Reported on 03/07/2021) 30 tablet 1   promethazine (PHENERGAN) 25 MG tablet TAKE 1 TABLET BY MOUTH EVERY 8 HOURS AS NEEDED FOR NAUSEA AND VOMITING (Patient not taking: Reported  on 03/07/2021) 90 tablet 1   sertraline (ZOLOFT) 25 MG tablet Take 25 mg by mouth daily.     traMADol (ULTRAM) 50 MG tablet Take 1 tablet (50 mg total) by mouth every 6 (six) hours as needed for severe pain. (Patient not taking: Reported on 03/07/2021) 8 tablet 0   No current facility-administered medications for this visit.    VITAL SIGNS: There were no vitals taken for this visit. There were no vitals filed for this visit.  Estimated body mass index is 23.56 kg/m as calculated from the following:   Height as of 03/14/21: _0  (1.6 m).   Weight as of an earlier encounter on 03/30/21: 60.3 kg (133 lb).  LABS: CBC:    Component Value Date/Time   WBC 8.3 03/30/2021 0909   HGB 9.5 (L) 03/30/2021 0909   HCT 28.2 (L) 03/30/2021 0909   PLT 383 03/30/2021 0909   MCV 78.6 (L) 03/30/2021 0909   NEUTROABS 5.5 03/30/2021 0909   LYMPHSABS 1.8 03/30/2021 0909   MONOABS 0.8 03/30/2021 0909   EOSABS 0.1 03/30/2021 0909   BASOSABS 0.1 03/30/2021 0909   Comprehensive Metabolic Panel:    Component Value Date/Time   NA 139 03/30/2021 0909   K 3.8 03/30/2021 0909   CL 108 03/30/2021 0909   CO2 19 (L) 03/30/2021 0909   BUN 26 (H) 03/30/2021 0909   CREATININE 0.93 03/30/2021 0909   GLUCOSE 260 (H) 03/30/2021 0909   CALCIUM 8.7 (L) 03/30/2021 0909   AST 48 (H) 03/30/2021 0909   ALT 49 (H) 03/30/2021 0909   ALKPHOS 931 (H) 03/30/2021 0909   BILITOT 2.7 (H) 03/30/2021 0909   PROT 5.5 (L) 03/30/2021 0909   ALBUMIN 2.2 (L) 03/30/2021 0909     Present during today's visit: patient and her daughter Nancy Rodriguez  Assessment and Plan: Restart lenalidomide once the medication is in hand Nancy Rodriguez and her sister are now taking over medication management for Ms. Pop. Reviewed current medication list with Nancy Rodriguez. To make sure they have an accurate list of medication, they will return on Monday for an appt with all of her home medications. Nancy Rodriguez was interested in medication packaging for her mom. Provided  her with name and number of [facility], a local pharmacy that does medication packaging. Nancy Rodriguez was also going to call [facility] where her mom currently fills her medication to see if they could package the medication.   New medications: none reported  Medication Access Issues: prescription for lenalidomide sent to [facility] today. Will help to coordinate fill.  Patient expressed understanding and was in agreement with this plan. She also understands that She can call clinic at any time with any questions, concerns, or complaints.   Follow-up plan: RTC on 04/03/21  Thank you for allowing me  to participate in the care of this very pleasant patient.   Time Total: 43mn  Visit consisted of counseling and education on dealing with issues of symptom management in the setting of serious and potentially life-threatening illness.Greater than 50%  of this time was spent counseling and coordinating care related to the above assessment and plan.  Signed by: ADarl Pikes PharmD, BCPS, BSalley Slaughter CPP Hematology/Oncology Clinical Pharmacist Practitioner [place]/DB/AP Oral [phone] 03/30/2021 4:41 PM

## 2021-03-30 NOTE — Patient Instructions (Signed)
[facility] AT [place]  Discharge Instructions: Thank you for choosing [facility] to provide your oncology and hematology care.  If you have a lab appointment with the [facility], please go directly to the [facility] and check in at the registration area.  Wear comfortable clothing and clothing appropriate for easy access to any Portacath or PICC line.   We strive to give you quality time with your provider. You may need to reschedule your appointment if you arrive late (15 or more minutes).  Arriving late affects you and other patients whose appointments are after yours.  Also, if you miss three or more appointments without notifying the office, you may be dismissed from the clinic at the providers discretion.      For prescription refill requests, have your pharmacy contact our office and allow 72 hours for refills to be completed.    Today you received the following chemotherapy and/or immunotherapy agents Darzalex      To help prevent nausea and vomiting after your treatment, we encourage you to take your nausea medication as directed.  BELOW ARE SYMPTOMS THAT SHOULD BE REPORTED IMMEDIATELY: *FEVER GREATER THAN 100.4 F (38 C) OR HIGHER *CHILLS OR SWEATING *NAUSEA AND VOMITING THAT IS NOT CONTROLLED WITH YOUR NAUSEA MEDICATION *UNUSUAL SHORTNESS OF BREATH *UNUSUAL BRUISING OR BLEEDING *URINARY PROBLEMS (pain or burning when urinating, or frequent urination) *BOWEL PROBLEMS (unusual diarrhea, constipation, pain near the anus) TENDERNESS IN MOUTH AND THROAT WITH OR WITHOUT PRESENCE OF ULCERS (sore throat, sores in mouth, or a toothache) UNUSUAL RASH, SWELLING OR PAIN  UNUSUAL VAGINAL DISCHARGE OR ITCHING   Items with * indicate a potential emergency and should be followed up as soon as possible or go to the Emergency Department if any problems should occur.  Please show the CHEMOTHERAPY ALERT CARD or IMMUNOTHERAPY ALERT CARD at check-in to  the Emergency Department and triage nurse.  Should you have questions after your visit or need to cancel or reschedule your appointment, please contact [facility] [place] AT [place]  [phone] and follow the prompts.  Office hours are 8:00 a.m. to 4:30 p.m. Monday - Friday. Please note that voicemails left after 4:00 p.m. may not be returned until the following business day.  We are closed weekends and major holidays. You have access to a nurse at all times for urgent questions. Please call the main number to the clinic [phone] and follow the prompts.  For any non-urgent questions, you may also contact your provider using MyChart. We now offer e-Visits for anyone 76 and older to request care online for non-urgent symptoms. For details visit [email or web address].   Also download the MyChart app! Go to the app store, search "MyChart", open the app, select [facility], and log in with your MyChart username and password.  Due to Covid, a mask is required upon entering the hospital/clinic. If you do not have a mask, one will be given to you upon arrival. For doctor visits, patients may have 1 support person aged 76 or older with them. For treatment visits, patients cannot have anyone with them due to current Covid guidelines and our immunocompromised population.

## 2021-03-30 NOTE — Progress Notes (Signed)
Glucose 260, Alk Phos 931, Bili 2.7, AST 48, and ALT 49. Per Benjamine Mola per Dr. Tasia Catchings, proceed with treatment.   Per Dr. Tasia Catchings, patient is to be monitored x 20 Minutes post Darzalex injection.   1215: Patient monitored x 20 minutes. Patient tolerated treatment well. Advised patient to contact clinic should she need anything. Patient verbalizes understanding and denies any further questions or concerns.

## 2021-03-31 ENCOUNTER — Other Ambulatory Visit: Payer: Self-pay | Admitting: Internal Medicine

## 2021-03-31 ENCOUNTER — Telehealth: Payer: Self-pay | Admitting: Pharmacist

## 2021-03-31 LAB — KAPPA/LAMBDA LIGHT CHAINS
Kappa free light chain: 14.4 mg/L (ref 3.3–19.4)
Kappa, lambda light chain ratio: 1.21 (ref 0.26–1.65)
Lambda free light chains: 11.9 mg/L (ref 5.7–26.3)

## 2021-03-31 LAB — BETA 2 MICROGLOBULIN, SERUM: Beta-2 Microglobulin: 2.9 mg/L — ABNORMAL HIGH (ref 0.6–2.4)

## 2021-03-31 NOTE — Telephone Encounter (Signed)
Please schedule overdue F/U appointment for refills. Thank you! 

## 2021-03-31 NOTE — Telephone Encounter (Signed)
Conference patient and her daughter into a phone call with [facility] to set-up deliver of her lenalidomide.

## 2021-04-03 ENCOUNTER — Other Ambulatory Visit: Payer: Self-pay

## 2021-04-03 ENCOUNTER — Inpatient Hospital Stay: Payer: Medicare Other | Admitting: Pharmacist

## 2021-04-03 DIAGNOSIS — E859 Amyloidosis, unspecified: Secondary | ICD-10-CM

## 2021-04-03 DIAGNOSIS — I10 Essential (primary) hypertension: Secondary | ICD-10-CM

## 2021-04-03 DIAGNOSIS — Z5112 Encounter for antineoplastic immunotherapy: Secondary | ICD-10-CM | POA: Diagnosis not present

## 2021-04-03 NOTE — Progress Notes (Signed)
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 Currently treated with Revlimid (lenalidomide), daratumumab, and dexamethasone. Patient's lenalidomide was on hold, but she is now okay to resume lenaliomide.  Reason for Consult: Medication reconciliation   PAST MEDICAL HISTORY: Past Medical History:  Diagnosis Date   Anemia    Anemia in chronic kidney disease   Chronic kidney disease    Stage 3b chronic kidney disease   Diabetes mellitus without complication (HCC)    Hypercholesterolemia    Hypertension    MGUS (monoclonal gammopathy of unknown significance)    Osteoarthritis    Rheumatoid arthritis ([place])     HEMATOLOGY/ONCOLOGY HISTORY:  Oncology History   No history exists.    ALLERGIES:  is allergic to benazepril, lisinopril, tolmetin, and nsaids.  MEDICATIONS:  Current Outpatient Medications  Medication Sig Dispense Refill   acyclovir (ZOVIRAX) 400 MG tablet Take 1 tablet (400 mg total) by mouth 2 (two) times daily. (Patient not taking: Reported on 03/30/2021) 60 tablet 5   albuterol (VENTOLIN HFA) 108 (90 Base) MCG/ACT inhaler Inhale 2 puffs into the lungs every 4 (four) hours as needed for wheezing or shortness of breath.     amLODipine (NORVASC) 5 MG tablet Take 5 mg by mouth daily.     Ascorbic Acid (VITAMIN C) 500 MG CAPS Take 500 mcg by mouth daily. 30 capsule 1   aspirin EC 81 MG tablet Take 1 tablet (81 mg total) by mouth daily. Swallow whole. 30 tablet 0   Continuous Blood Gluc Sensor (FREESTYLE LIBRE 14 DAY SENSOR) MISC SMARTSIG:1 Kit(s) Topical Every 2 Weeks     dexamethasone (DECADRON) 4 MG tablet Take 5 tablets (20 mg  total) by mouth once a week. 60 tablet 0   EPINEPHrine 0.3 mg/0.3 mL IJ SOAJ injection Inject 0.3 mg into the muscle as needed.     ferrous sulfate 325 (65 FE) MG tablet Take 1 tablet (325 mg total) by mouth 3 (three) times daily with meals. (Patient not taking: Reported on 03/30/2021) 90 tablet 3   furosemide (LASIX) 20 MG tablet TAKE 2 TABLETS BY MOUTH DAILY. TAKE 1 TABLET DAILY FOR 3 DAYS OR UNTIL SWELLING IMPROVES (Patient not taking: Reported on 03/30/2021) 60 tablet 3   gabapentin (NEURONTIN) 100 MG capsule Take 100 mg by mouth at bedtime.     hydrOXYzine (ATARAX/VISTARIL) 10 MG tablet Take 10 mg by mouth 2 (two) times daily as needed for itching.     insulin aspart (NOVOLOG FLEXPEN) 100 UNIT/ML FlexPen Inject 10 Units into the skin every evening.     insulin glargine (LANTUS SOLOSTAR) 100 UNIT/ML Solostar Pen Inject 50 Units into the skin daily. Pt takes in morning and bedtime.     KLOR-CON M20 20 MEQ tablet TAKE 2 TABLETS BY MOUTH TWICE A DAY (Patient not taking: Reported on 03/30/2021) 120 tablet 0   lenalidomide (REVLIMID) 10 MG capsule Take 1 capsule (10 mg total) by mouth daily. Take for 21 days, then hold for 7 days. Repeat every 28 days. 21 capsule 0   LORazepam (ATIVAN) 0.5 MG tablet Take 1 tablet (0.5 mg total) by mouth every 6 (six) hours as needed (nausea vomiting). (Patient not taking:  Reported on 03/07/2021) 30 tablet 0   megestrol (MEGACE) 40 MG tablet Take 40 mg by mouth 2 (two) times daily.     metFORMIN (GLUCOPHAGE) 500 MG tablet Take 500 mg by mouth every evening.     omeprazole (PRILOSEC) 40 MG capsule Take 40 mg by mouth daily.     ondansetron (ZOFRAN) 8 MG tablet TAKE 8 MG BY MOUTH 30 TO 60 MIN PRIOR TO CYTOXAN ADMINISTRATION THEN TAKE 8 MG TWICE DAILY AS NEEDED FOR NAUSEA AND VOMITING. (Patient not taking: Reported on 03/07/2021) 30 tablet 1   promethazine (PHENERGAN) 25 MG tablet TAKE 1 TABLET BY MOUTH EVERY 8 HOURS AS NEEDED FOR NAUSEA AND VOMITING (Patient not taking:  Reported on 03/07/2021) 90 tablet 1   sertraline (ZOLOFT) 25 MG tablet Take 25 mg by mouth daily.     traMADol (ULTRAM) 50 MG tablet Take 1 tablet (50 mg total) by mouth every 6 (six) hours as needed for severe pain. (Patient not taking: Reported on 03/07/2021) 8 tablet 0   No current facility-administered medications for this visit.    VITAL SIGNS: There were no vitals taken for this visit. There were no vitals filed for this visit.  Estimated body mass index is 23.56 kg/m as calculated from the following:   Height as of 03/14/21: $RemoveBefo'5\' 3"'[id number]$  (1.6 m).   Weight as of 03/30/21: 60.3 kg (133 lb).  LABS: CBC:    Component Value Date/Time   WBC 8.3 03/30/2021 0909   HGB 9.5 (L) 03/30/2021 0909   HCT 28.2 (L) 03/30/2021 0909   PLT 383 03/30/2021 0909   MCV 78.6 (L) 03/30/2021 0909   NEUTROABS 5.5 03/30/2021 0909   LYMPHSABS 1.8 03/30/2021 0909   MONOABS 0.8 03/30/2021 0909   EOSABS 0.1 03/30/2021 0909   BASOSABS 0.1 03/30/2021 0909   Comprehensive Metabolic Panel:    Component Value Date/Time   NA 139 03/30/2021 0909   K 3.8 03/30/2021 0909   CL 108 03/30/2021 0909   CO2 19 (L) 03/30/2021 0909   BUN 26 (H) 03/30/2021 0909   CREATININE 0.93 03/30/2021 0909   GLUCOSE 260 (H) 03/30/2021 0909   CALCIUM 8.7 (L) 03/30/2021 0909   AST 48 (H) 03/30/2021 0909   ALT 49 (H) 03/30/2021 0909   ALKPHOS 931 (H) 03/30/2021 0909   BILITOT 2.7 (H) 03/30/2021 0909   PROT 5.5 (L) 03/30/2021 0909   ALBUMIN 2.2 (L) 03/30/2021 0909     Present during today's visit: patient and her daughter Nancy Rodriguez  Assessment and Plan: Nancy Rodriguez brought in all of her mom's medications Preformed med reconciliation: Reviewed all of the bottles, consolidated medications that were duplicates, removed empty bottles, and updated [facility] med list.   Other: They picked up [name]'s Revlimid on there way in today. They know she is to get started on this today.   Patient expressed understanding and was in agreement with this  plan. She also understands that She can call clinic at any time with any questions, concerns, or complaints.   Follow-up plan: RTC to pick up updated medication list (will patient patient when is ready)  Thank you for allowing me to participate in the care of this very pleasant patient.   Time Total: 50 min  Visit consisted of counseling and education on dealing with issues of symptom management in the setting of serious and potentially life-threatening illness.Greater than 50%  of this time was spent counseling and coordinating care related to the above assessment and plan.  Signed by: Clearnce Sorrel  N. Hollice Espy, PharmD, Bebe Shaggy, CPP Hematology/Oncology Clinical Pharmacist Practitioner [place]/DB/AP Oral [facility] [phone]  04/03/2021 3:44 PM

## 2021-04-03 NOTE — Telephone Encounter (Signed)
Attempted to schedule.  LMOV to call office.  ° °

## 2021-04-04 LAB — MULTIPLE MYELOMA PANEL, SERUM
Albumin SerPl Elph-Mcnc: 2.3 g/dL — ABNORMAL LOW (ref 2.9–4.4)
Albumin/Glob SerPl: 0.9 (ref 0.7–1.7)
Alpha 1: 0.4 g/dL (ref 0.0–0.4)
Alpha2 Glob SerPl Elph-Mcnc: 0.9 g/dL (ref 0.4–1.0)
B-Globulin SerPl Elph-Mcnc: 1 g/dL (ref 0.7–1.3)
Gamma Glob SerPl Elph-Mcnc: 0.4 g/dL (ref 0.4–1.8)
Globulin, Total: 2.6 g/dL (ref 2.2–3.9)
IgA: 84 mg/dL (ref 64–422)
IgG (Immunoglobin G), Serum: 448 mg/dL — ABNORMAL LOW (ref 586–1602)
IgM (Immunoglobulin M), Srm: 77 mg/dL (ref 26–217)
M Protein SerPl Elph-Mcnc: 0.1 g/dL — ABNORMAL HIGH
Total Protein ELP: 4.9 g/dL — ABNORMAL LOW (ref 6.0–8.5)

## 2021-04-06 ENCOUNTER — Other Ambulatory Visit: Payer: Self-pay

## 2021-04-06 ENCOUNTER — Ambulatory Visit
Admission: RE | Admit: 2021-04-06 | Discharge: 2021-04-06 | Disposition: A | Discharge status: Home / Self Care | Payer: Medicare Other | Source: Ambulatory Visit | Attending: Oncology | Admitting: Oncology

## 2021-04-06 DIAGNOSIS — K8689 Other specified diseases of pancreas: Secondary | ICD-10-CM | POA: Diagnosis present

## 2021-04-06 IMAGING — MR MR ABDOMEN WO/W CM
19 of 21 series · 45 of 48 positions shown · IV contrast (gadavist)
Comparison: Ultrasound on [DATE] and MRI on [DATE]

CLINICAL DATA: Abdominal pain and distention. Possible pancreatic
mass on recent ultrasound.

EXAM:
MRI ABDOMEN WITHOUT AND WITH CONTRAST
TECHNIQUE: Multiplanar multisequence MR imaging of the abdomen was performed
both before and after the administration of intravenous contrast.
CONTRAST:  6mL GADAVIST GADOBUTROL 1 MMOL/ML IV SOLN

[Series 3: ax haste · axial · 6.5mm · 1.19mm/px · z∈[-304,-38]mm · 2 of 35 slices shown]
[im 1/35]
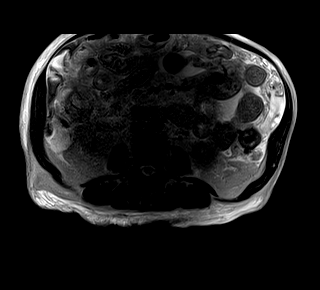
[im 35/35]
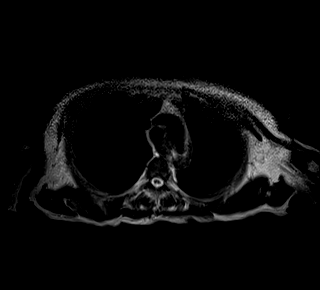

[Series 6: T2 fat-sat · axial · 6.5mm · 1.19mm/px · z∈[-304,-38]mm · 2 of 35 slices shown]
[im 1/35]
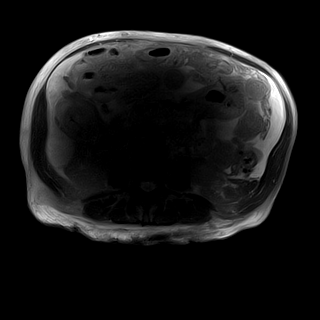
[im 35/35]
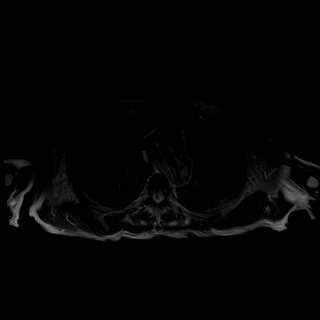

[Series 7: cor haste · coronal · 6.0mm · 1.19mm/px · 2 of 32 slices shown]
[im 1/32]
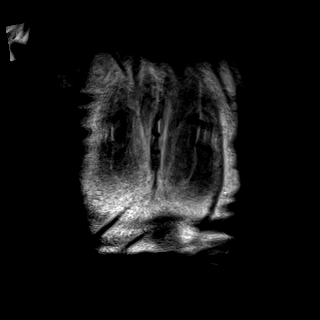
[im 32/32]
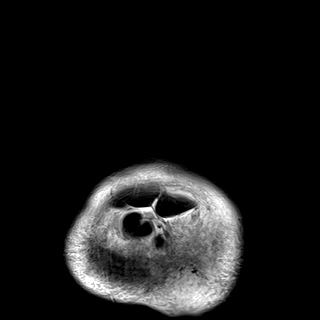

[Series 8: ax dwi_tracew · axial · 6.5mm · 1.42mm/px · z∈[-331,-58]mm · 4 of 108 slices shown]
[im 1/108]
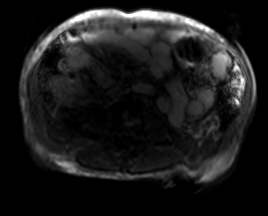
[im 36/108]
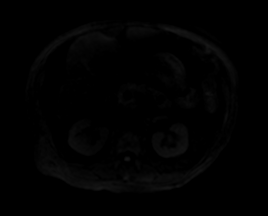
[im 72/108]
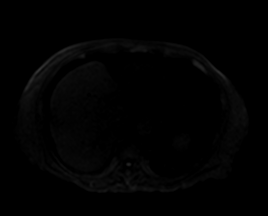
[im 108/108]
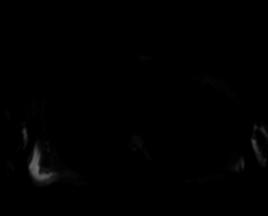

[Series 9: ax dwi_adc · axial · 6.5mm · 1.42mm/px · 1 of 36 slices shown]
[im 1/36]
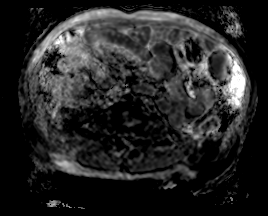

[Series 10: T1 · axial · 6.5mm · 0.74mm/px · 1 of 36 slices shown (1 of 2)]
[im 1/36]
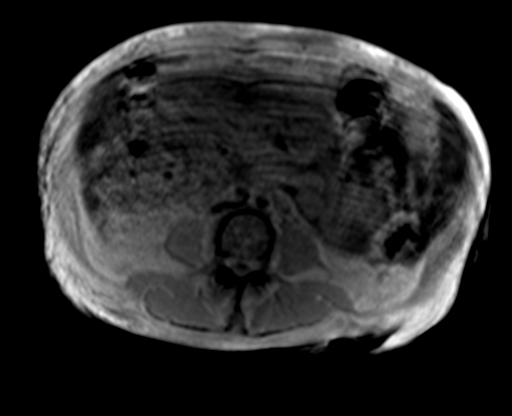

[Series 10: T1 · axial · 6.5mm · 0.74mm/px · 1 of 36 slices shown (2 of 2)]
[im 1/36]
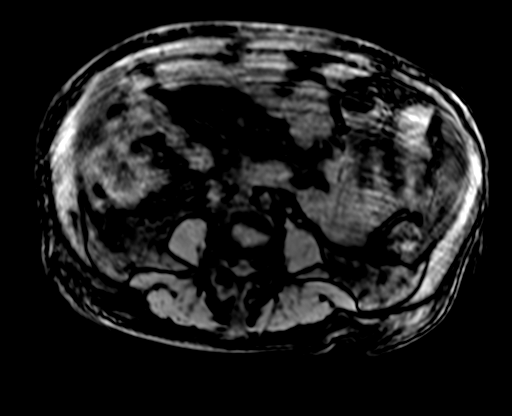

[Series 11: bSSFP · axial · 6.5mm · 0.74mm/px · 1 of 35 slices shown]
[im 1/35]
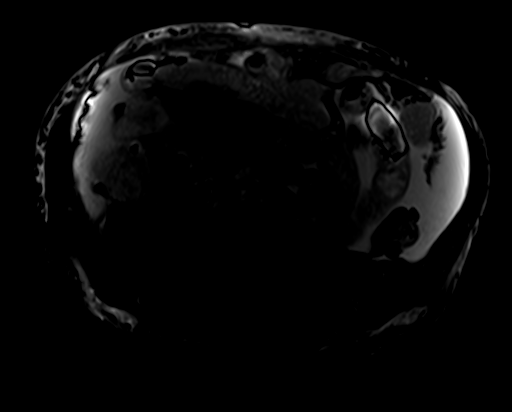

[Series 15: T1 dynamic fat-sat · axial · non-contrast · 3.0mm · 1.19mm/px · z∈[-326,-65]mm · 3 of 88 slices shown (1 of 5)]
[im 1/88]
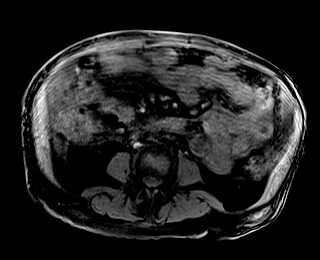
[im 44/88]
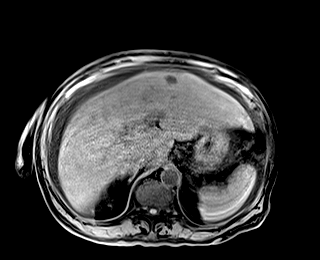
[im 88/88]
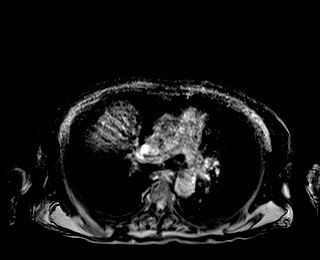

[Series 16: radials · coronal · 50.0mm · 0.78mm/px · 1 of 5 slices shown]
[im 1/5]
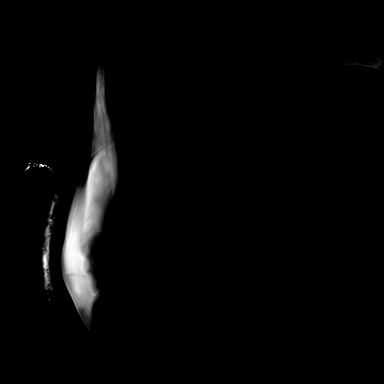

[Series 17: T1 dynamic fat-sat post-contrast · axial · 3.0mm · 1.19mm/px · z∈[-326,-65]mm · 3 of 88 slices shown (1 of 4)]
[im 1/88]
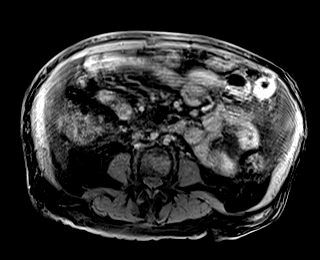
[im 44/88]
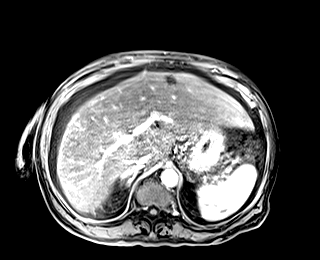
[im 88/88]
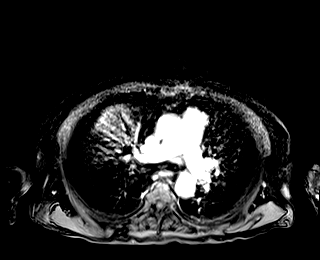

[Series 18: T1 dynamic fat-sat · axial · 3.0mm · 1.19mm/px · z∈[-326,-65]mm · 3 of 88 slices shown (2 of 5)]
[im 1/88]
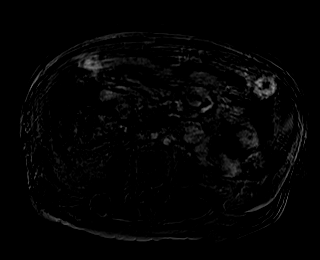
[im 44/88]
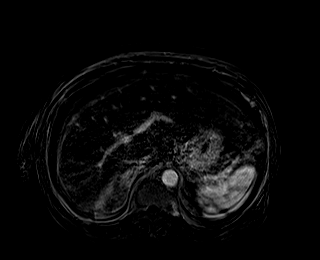
[im 88/88]
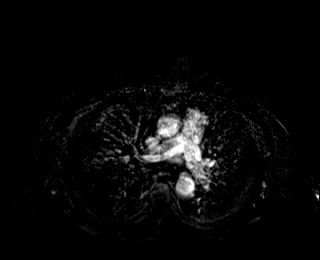

[Series 19: T1 dynamic fat-sat post-contrast · axial · 3.0mm · 1.19mm/px · z∈[-326,-65]mm · 3 of 88 slices shown (2 of 4)]
[im 1/88]
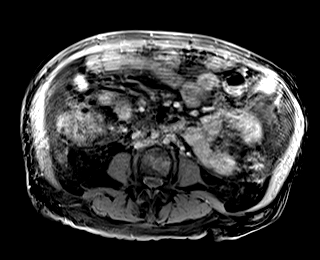
[im 44/88]
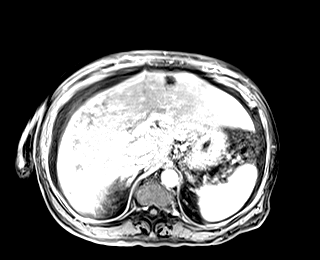
[im 88/88]
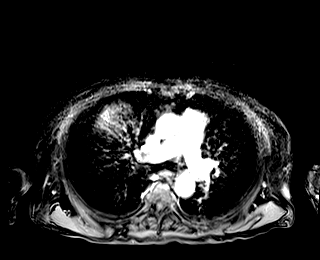

[Series 20: T1 dynamic fat-sat · axial · 3.0mm · 1.19mm/px · z∈[-326,-65]mm · 3 of 88 slices shown (3 of 5)]
[im 1/88]
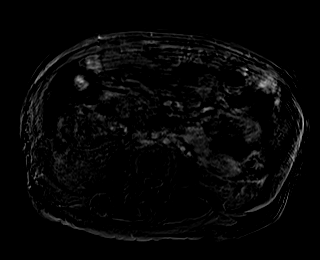
[im 44/88]
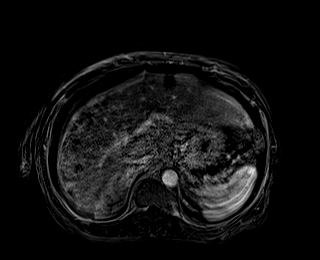
[im 88/88]
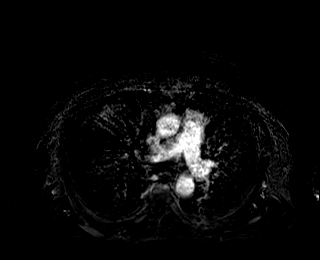

[Series 21: T1 dynamic fat-sat post-contrast · axial · 3.0mm · 1.19mm/px · z∈[-326,-65]mm · 3 of 88 slices shown (3 of 4)]
[im 1/88]
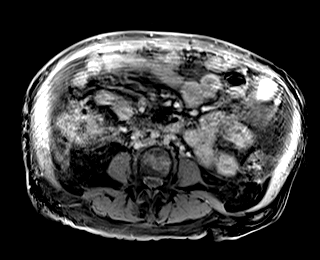
[im 44/88]
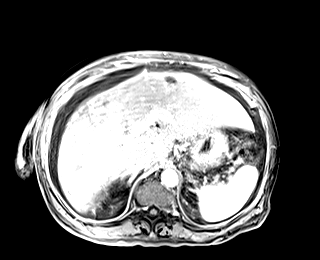
[im 88/88]
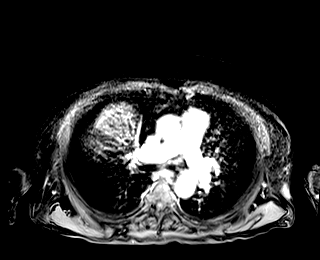

[Series 22: T1 dynamic fat-sat · axial · 3.0mm · 1.19mm/px · z∈[-326,-65]mm · 3 of 88 slices shown (4 of 5)]
[im 1/88]
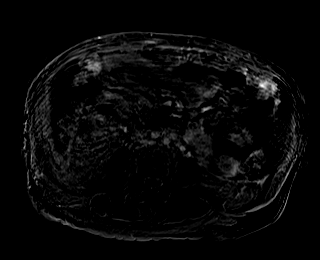
[im 44/88]
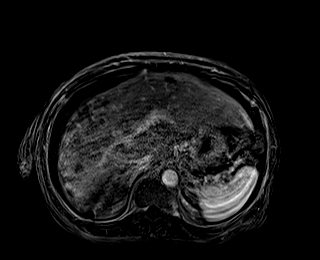
[im 88/88]
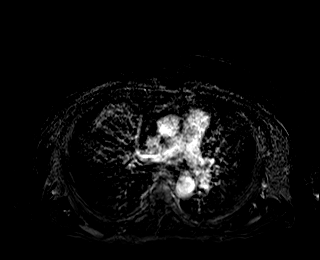

[Series 23: T1 dynamic post-contrast · coronal · 3.0mm · 1.31mm/px · 3 of 80 slices shown]
[im 1/80]
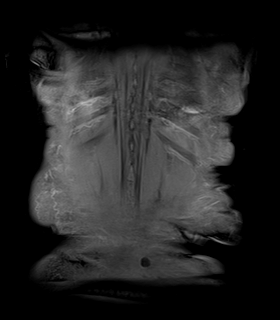
[im 40/80]
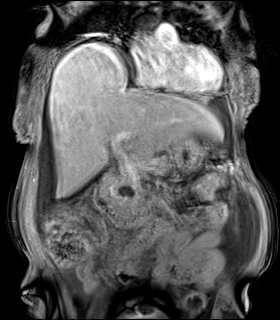
[im 80/80]
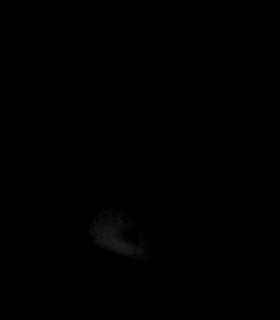

[Series 24: T1 dynamic fat-sat post-contrast · axial · 3.0mm · 1.19mm/px · z∈[-326,-65]mm · 3 of 88 slices shown (4 of 4)]
[im 1/88]
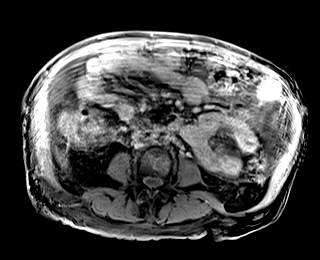
[im 44/88]
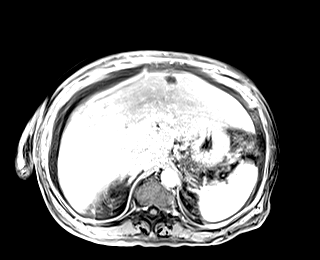
[im 88/88]
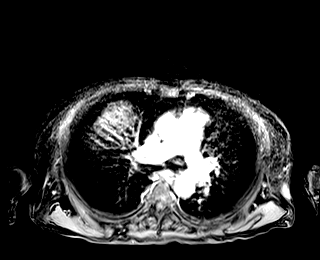

[Series 25: T1 dynamic fat-sat · axial · 3.0mm · 1.19mm/px · z∈[-326,-65]mm · 3 of 88 slices shown (5 of 5)]
[im 1/88]
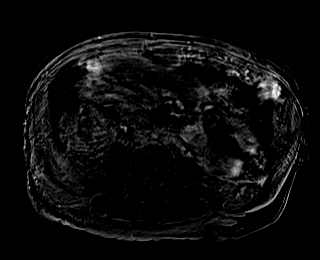
[im 44/88]
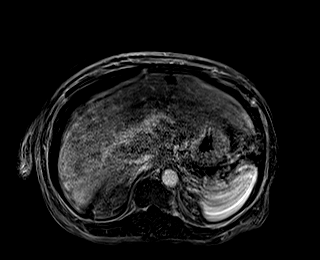
[im 88/88]
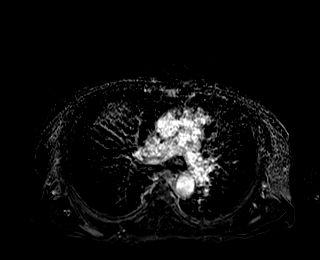

[45 of 48 positions shown; findings below may reference images not displayed]

FINDINGS: Image degradation due to motion artifact and ascites noted.

Lower chest: No acute findings.

Hepatobiliary: Cirrhotic liver morphology again noted. A 13 mm cyst
is again seen in the left hepatic lobe, without significant change.
No hepatic masses are identified. Several tiny gallstones are again
noted, however there is no evidence of cholecystitis or biliary
ductal dilatation.

Pancreas: A simple bilobed cystic lesion is seen in the pancreatic
head measuring 2.3 x 1.6 cm on image [DATE]. A simple cystic lesion is
also seen in the pancreatic tail measuring 1.8 x 1.3 cm on image
[DATE]. These are new since previous exam, which favors pancreatic
pseudocysts over cystic pancreatic neoplasms. No evidence of
pancreatic ductal dilatation.

Spleen:  Within normal limits in size and appearance.

Adrenals/Urinary Tract: No masses identified. No evidence of
hydronephrosis.

Stomach/Bowel: Small hiatal hernia noted. Mild diffuse small bowel
wall thickening and mesenteric edema, likely due to hypoalbuminemia.

Vascular/Lymphatic: No pathologically enlarged lymph nodes
identified. No acute vascular findings.

Other:  Mild ascites.  Diffuse mesenteric and body wall edema.

Musculoskeletal:  No suspicious bone lesions identified.
IMPRESSION: Two simple cystic lesions in the pancreatic head and tail. These are
new since prior MRI, which favors pancreatic pseudocysts over cystic
pancreatic neoplasms. Recommend continued follow-up by MRI or CT in
6 months.

Hepatic cirrhosis. No evidence of hepatic neoplasm.

Mild ascites, diffuse mesenteric and body wall edema, and small
hiatal hernia.

Diffuse small bowel wall thickening, likely due to hypoalbuminemia.

Cholelithiasis. No radiographic evidence of cholecystitis or biliary
ductal dilatation.

## 2021-04-06 MED ORDER — GADOBUTROL 1 MMOL/ML IV SOLN
6.0000 mL | Freq: Once | INTRAVENOUS | Status: AC | PRN
  Administered 2021-04-06: 14:00:00 6 mL via INTRAVENOUS

## 2021-04-18 ENCOUNTER — Other Ambulatory Visit: Payer: Self-pay | Admitting: *Deleted

## 2021-04-18 MED ORDER — DEXAMETHASONE 4 MG PO TABS: 20.0000 mg | ORAL_TABLET | ORAL | 0 refills | Status: DC

## 2021-04-27 ENCOUNTER — Encounter: Payer: Self-pay | Admitting: Licensed Clinical Social Worker

## 2021-04-27 ENCOUNTER — Other Ambulatory Visit: Payer: Self-pay | Admitting: Internal Medicine

## 2021-04-27 ENCOUNTER — Encounter: Payer: Self-pay | Admitting: Oncology

## 2021-04-27 ENCOUNTER — Inpatient Hospital Stay: Payer: Medicare Other

## 2021-04-27 ENCOUNTER — Inpatient Hospital Stay: Payer: Medicare Other | Attending: Oncology

## 2021-04-27 ENCOUNTER — Inpatient Hospital Stay (HOSPITAL_BASED_OUTPATIENT_CLINIC_OR_DEPARTMENT_OTHER): Payer: Medicare Other | Admitting: Oncology

## 2021-04-27 ENCOUNTER — Other Ambulatory Visit: Payer: Self-pay

## 2021-04-27 ENCOUNTER — Other Ambulatory Visit: Payer: Self-pay | Admitting: Oncology

## 2021-04-27 ENCOUNTER — Inpatient Hospital Stay (HOSPITAL_BASED_OUTPATIENT_CLINIC_OR_DEPARTMENT_OTHER): Payer: Medicare Other | Admitting: Hospice and Palliative Medicine

## 2021-04-27 VITALS — BP 156/81 | HR 98 | Temp 97.8°F | Wt 132.5 lb

## 2021-04-27 DIAGNOSIS — K7460 Unspecified cirrhosis of liver: Secondary | ICD-10-CM | POA: Diagnosis not present

## 2021-04-27 DIAGNOSIS — D631 Anemia in chronic kidney disease: Secondary | ICD-10-CM

## 2021-04-27 DIAGNOSIS — Z5112 Encounter for antineoplastic immunotherapy: Secondary | ICD-10-CM | POA: Diagnosis not present

## 2021-04-27 DIAGNOSIS — Z79899 Other long term (current) drug therapy: Secondary | ICD-10-CM | POA: Diagnosis not present

## 2021-04-27 DIAGNOSIS — I10 Essential (primary) hypertension: Secondary | ICD-10-CM

## 2021-04-27 DIAGNOSIS — R634 Abnormal weight loss: Secondary | ICD-10-CM

## 2021-04-27 DIAGNOSIS — E876 Hypokalemia: Secondary | ICD-10-CM

## 2021-04-27 DIAGNOSIS — N939 Abnormal uterine and vaginal bleeding, unspecified: Secondary | ICD-10-CM

## 2021-04-27 DIAGNOSIS — N1831 Chronic kidney disease, stage 3a: Secondary | ICD-10-CM

## 2021-04-27 DIAGNOSIS — R7989 Other specified abnormal findings of blood chemistry: Secondary | ICD-10-CM

## 2021-04-27 DIAGNOSIS — Z515 Encounter for palliative care: Secondary | ICD-10-CM

## 2021-04-27 DIAGNOSIS — D500 Iron deficiency anemia secondary to blood loss (chronic): Secondary | ICD-10-CM | POA: Insufficient documentation

## 2021-04-27 DIAGNOSIS — K862 Cyst of pancreas: Secondary | ICD-10-CM

## 2021-04-27 DIAGNOSIS — E859 Amyloidosis, unspecified: Secondary | ICD-10-CM

## 2021-04-27 DIAGNOSIS — E8581 Light chain (AL) amyloidosis: Secondary | ICD-10-CM

## 2021-04-27 DIAGNOSIS — E8809 Other disorders of plasma-protein metabolism, not elsewhere classified: Secondary | ICD-10-CM | POA: Insufficient documentation

## 2021-04-27 DIAGNOSIS — Z794 Long term (current) use of insulin: Secondary | ICD-10-CM | POA: Insufficient documentation

## 2021-04-27 DIAGNOSIS — E1122 Type 2 diabetes mellitus with diabetic chronic kidney disease: Secondary | ICD-10-CM | POA: Insufficient documentation

## 2021-04-27 DIAGNOSIS — Z5111 Encounter for antineoplastic chemotherapy: Secondary | ICD-10-CM

## 2021-04-27 LAB — CBC WITH DIFFERENTIAL/PLATELET
Abs Immature Granulocytes: 0.06 10*3/uL (ref 0.00–0.07)
Basophils Absolute: 0.1 10*3/uL (ref 0.0–0.1)
Basophils Relative: 1 %
Eosinophils Absolute: 0.1 10*3/uL (ref 0.0–0.5)
Eosinophils Relative: 2 %
HCT: 27.9 % — ABNORMAL LOW (ref 36.0–46.0)
Hemoglobin: 9.8 g/dL — ABNORMAL LOW (ref 12.0–15.0)
Immature Granulocytes: 1 %
Lymphocytes Relative: 17 %
Lymphs Abs: 1 10*3/uL (ref 0.7–4.0)
MCH: 27.1 pg (ref 26.0–34.0)
MCHC: 35.1 g/dL (ref 30.0–36.0)
MCV: 77.3 fL — ABNORMAL LOW (ref 80.0–100.0)
Monocytes Absolute: 1.2 10*3/uL — ABNORMAL HIGH (ref 0.1–1.0)
Monocytes Relative: 21 %
Neutro Abs: 3.3 10*3/uL (ref 1.7–7.7)
Neutrophils Relative %: 58 %
Platelets: 472 10*3/uL — ABNORMAL HIGH (ref 150–400)
RBC: 3.61 MIL/uL — ABNORMAL LOW (ref 3.87–5.11)
RDW: 24.9 % — ABNORMAL HIGH (ref 11.5–15.5)
WBC: 5.8 10*3/uL (ref 4.0–10.5)
nRBC: 0 % (ref 0.0–0.2)

## 2021-04-27 LAB — PROTIME-INR
INR: 1.2 (ref 0.8–1.2)
Prothrombin Time: 15.3 seconds — ABNORMAL HIGH (ref 11.4–15.2)

## 2021-04-27 LAB — COMPREHENSIVE METABOLIC PANEL
ALT: 44 U/L (ref 0–44)
AST: 36 U/L (ref 15–41)
Albumin: 2.5 g/dL — ABNORMAL LOW (ref 3.5–5.0)
Alkaline Phosphatase: 769 U/L — ABNORMAL HIGH (ref 38–126)
Anion gap: 7 (ref 5–15)
BUN: 33 mg/dL — ABNORMAL HIGH (ref 8–23)
CO2: 18 mmol/L — ABNORMAL LOW (ref 22–32)
Calcium: 8.2 mg/dL — ABNORMAL LOW (ref 8.9–10.3)
Chloride: 113 mmol/L — ABNORMAL HIGH (ref 98–111)
Creatinine, Ser: 1.07 mg/dL — ABNORMAL HIGH (ref 0.44–1.00)
GFR, Estimated: 54 mL/min — ABNORMAL LOW (ref >60)
Glucose, Bld: 90 mg/dL (ref 70–99)
Potassium: 3.1 mmol/L — ABNORMAL LOW (ref 3.5–5.1)
Sodium: 138 mmol/L (ref 135–145)
Total Bilirubin: 4.3 mg/dL — ABNORMAL HIGH (ref 0.3–1.2)
Total Protein: 5.7 g/dL — ABNORMAL LOW (ref 6.5–8.1)

## 2021-04-27 LAB — APTT: aPTT: 32 seconds (ref 24–36)

## 2021-04-27 MED ORDER — DARATUMUMAB-HYALURONIDASE-FIHJ 1800-30000 MG-UT/15ML ~~LOC~~ SOLN
1800.0000 mg | Freq: Once | SUBCUTANEOUS | Status: AC
  Administered 2021-04-27: 1800 mg via SUBCUTANEOUS
  Filled 2021-04-27: qty 15

## 2021-04-27 MED ORDER — LENALIDOMIDE 10 MG PO CAPS: 10.0000 mg | ORAL_CAPSULE | Freq: Every day | ORAL | 0 refills | Status: DC

## 2021-04-27 MED ORDER — DEXAMETHASONE 4 MG PO TABS
20.0000 mg | ORAL_TABLET | Freq: Once | ORAL | Status: AC
  Administered 2021-04-27: 20 mg via ORAL
  Filled 2021-04-27: qty 5

## 2021-04-27 MED ORDER — POTASSIUM CHLORIDE CRYS ER 20 MEQ PO TBCR: 20.0000 meq | EXTENDED_RELEASE_TABLET | Freq: Two times a day (BID) | ORAL | 1 refills | Status: DC

## 2021-04-27 MED ORDER — LOPERAMIDE HCL 2 MG PO CAPS: 2.0000 mg | ORAL_CAPSULE | ORAL | 0 refills | Status: DC

## 2021-04-27 MED ORDER — DIPHENHYDRAMINE HCL 25 MG PO CAPS
50.0000 mg | ORAL_CAPSULE | Freq: Once | ORAL | Status: AC
  Administered 2021-04-27: 50 mg via ORAL
  Filled 2021-04-27: qty 2

## 2021-04-27 MED ORDER — POTASSIUM CHLORIDE 10 MEQ/100ML IV SOLN
10.0000 meq | Freq: Once | INTRAVENOUS | Status: AC
  Administered 2021-04-27: 10 meq via INTRAVENOUS
  Filled 2021-04-27: qty 100

## 2021-04-27 MED ORDER — SODIUM CHLORIDE 0.9 % IV SOLN
INTRAVENOUS | Status: DC
  Filled 2021-04-27: qty 250

## 2021-04-27 MED ORDER — ACETAMINOPHEN 325 MG PO TABS
325.0000 mg | ORAL_TABLET | Freq: Once | ORAL | Status: AC
  Administered 2021-04-27: 325 mg via ORAL
  Filled 2021-04-27: qty 1

## 2021-04-27 NOTE — Progress Notes (Signed)
Hematology/Oncology  Follow up note [phone] [phone]   Patient Care Team: Leonel Ramsay, MD as PCP - General (Infectious Diseases) End, [name], MD as PCP - Cardiology (Cardiology)  REFERRING PROVIDER: Leonel Ramsay, MD  CHIEF COMPLAINTS/REASON FOR VISIT:  Follow-up for amyloidosis  HISTORY OF PRESENTING ILLNESS:   Nancy Rodriguez is a  76 y.o.  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  She was seen by Hematology Oncology at [facility] on 02/14/2017.  07/25/2007  SPEP showed M protein of 0.35, IFE showed IgG lamda 09/22/2007  Bone survey negative.  02/15/14 IgG 930, SPEP M protien 0.22, free kappa light chain 3.59, lamda 2.92, ratio 1.23  Hypercalcemia, resolved after stopping HCTZ.  # Transaminitis 03/28/20 US abdomen showed left liver lobe Complex 1.7 cm cyst  # 04/07/20 MRI abdomen w/wo contrast showed 2 benign liver cysts, 2 nonspecific hypovascular irighr liver lesions, abnormal bone marrow signal of L1 vertebral body. Patient was  advised to proceed with bone marrow biopsy and she declined.  # referred her to established care with GI and was seen on 04/19/20,  # Liver biopsy showed amyloidosis. Dickson MS/MS showed AL type # 05/20/20 recommend bone marrow biopsy which is scheduled on 05/24/2020.  Patient changed her mind and ask a biopsy to be canceled.  My team and I had multiple phone discussion with patient's daughter [name] and later with [name].  Patient agreed with bone marrow biopsy and a biopsy was scheduled on 06/09/2020.  05/20/20 NT proBNP  was normal,  TSH slightly increased, normal T4 Normal factor X Normal coags Troponin 19. Refer to cardiology for evaluation. May need cardiac MRI  # 06/01/2020 2D echo result  was reviewed and discussed with patient.  Patient has normal LVEF 60-65%. Mild asymmetric left ventricular hypertrophy.  Left ventricular diastolic parameters are indeterminate. average left ventricular global longitudinal strain is -16.8 %.  # 06/09/2020, bone marrow biopsy showed monoclonal plasmacytosis, 8%, amyloid deposit present.  Absent iron stores. 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 t (14;20) #06/17/2020, further discussed about diagnosis and treatment plan.  Patient declined bone marrow transplant evaluation.  Decision was made to proceed with Dara-CyborD chemotherapy treatments. #May 2022, second opinion at [facility] with Dr. Tracey Harries.who agrees with current treatment plan.  He recommends to lower dexamethasone to 20 mg and decrease premed Tylenol to 325 mg  09/01/2020 cardiac [facility] was performed at [facility].  Left ventricle is normal in size.  Mild to moderate basal septal hypertrophy.  Hyper dynamic LV function.  LVEF 71%.  Right ventricle is normal in size and wall thickness.  Systolic function normal.  Mild bi atrial enlargement, no significant aortic valve stenosis or regurgitation.  Trivial mitral regurgitation, mild tricuspid regurgitation and a trivial pulmonic regurgitation.  No evidence of MI, scarring or infiltration.  #History of major depression listed in outside problem list.  previous diagnosis and treatment details are not available to me in EMR  I have referred her to psychiatrist  #  vaginal bleeding which stopped- she was seen by GYN and recommended for biopsy.   # She has poor IV access and was not able to receive IV Emend and IV Aloxi. She does not want to have med port.   #  GERD   Dr. Sampson Goon has increased her omeprazole to 40 mg daily. She feels symptoms are improved.  # 10/08/2020, cervical spine showed cervical spondylosis.  Spinal canal diagnosis and a posterior disc osteophyte complex and superimposed small central disc  protrusion contact the ventral spinal cord at C4-C5.  Multilevel neuroforaminal narrowing. bilateral atlantooccipital joint effusions, multilevel grade 1 spondylolisthesis. MRI lumbar spine without contrast showed acute or subacute fracture of L4.   Moderate spinal stenosis at L3-4 has progressed. Shallow left foraminal disc protrusion has progressed. Moderate subarticular stenosis on the left also with progression  Moderate spinal stenosis L4-5 with progression. Moderate subarticular stenosis on the left with progression  # 10/25/2020 seen by [facility] oncology Dr.Kang who recommend patient to switch Dara CyBorD to Dara RD  # 11/03/2020 Revlimid (lenalidomide) 10 mg by mouth daily for 14 days, then hold for 7 days. Pt was started on a shortened cycle in order to remain on similar schedule with infusion.   # 11/22/2020 seen by hepatologist Dr.Kappus Molli Hazard. Recommended to start Zoloft 50mg  daily for pruritis # 11/24/2020 cycle 2 Revlimid 10 mg, she finished 14 days.  Rest of the course was held due to anemia.  Patient declined blood transfusion.  # Vaginal bleeding  patient was seen by Dr. 01/24/2021,  on megace 40mg  daily. 12/22/2020 repeat EMBx - not diagnostic due to insufficient squamous component.   03/03/2021, ultrasound abdomen complete showed possible hypoechoic mass in the pancreatic tail.  1.8 x 1.2 x 1.4 cm.  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  Cholelithiasis.  MRI has been ordered for further evaluation and is scheduled.  INTERVAL HISTORY Nancy Rodriguez is a 76 y.o. female who has above history reviewed by me today presents for follow up visit for management of AL amyloidosis Patient was accompanied by her daughter today. Patient reports that her vaginal bleeding stopped and then restarted again.  She was previously offered by Dr. Wallene Dales to have another D&C and she declined. She plans to contact gynecology and now she is interested in the procedure. She  reports not feeling so well today.  Ongoing fatigue.  Appetite is not good.  No nausea vomiting. She reports having 2 episodes of soft bowel movements every day.  She denies bowel movement being watery.  She described as semi formed, not very loose.  Denies any abdominal pain. Patient and daughter denies any medication changes since last visit with me. Patient is not actively following up with her [facility] hepatologist.  Review of Systems  Constitutional:  Positive for fatigue. Negative for appetite change, chills, fever and unexpected weight change.  HENT:   Negative for hearing loss, nosebleeds and voice change.   Eyes:  Positive for icterus. Negative for eye problems.  Respiratory:  Negative for chest tightness and cough.   Cardiovascular:  Positive for leg swelling. Negative for chest pain.  Gastrointestinal:  Negative for abdominal distention, abdominal pain, blood in stool and nausea.  Endocrine: Negative for hot flashes.  Genitourinary:  Negative for difficulty urinating and frequency.   Musculoskeletal:  Positive for back pain. Negative for arthralgias.  Skin:  Negative for rash.       jaundice  Neurological:  Positive for numbness. Negative for extremity weakness.  Hematological:  Negative for adenopathy.  Psychiatric/Behavioral:  Negative for confusion.    MEDICAL HISTORY:  Past Medical History:  Diagnosis Date   Anemia    Anemia in chronic kidney disease   Chronic kidney disease  Stage 3b chronic kidney disease   Diabetes mellitus without complication (HCC)    Hypercholesterolemia    Hypertension    MGUS (monoclonal gammopathy of unknown significance)    Osteoarthritis    Rheumatoid arthritis ([place])     SURGICAL HISTORY: Past Surgical History:  Procedure Laterality Date   HERNIA REPAIR     PARATHYROIDECTOMY      SOCIAL HISTORY: Social History   Socioeconomic History   Marital status: Widowed    Spouse name: Not on file   Number of children: 6   Years of  education: Not on file   Highest education level: Not on file  Occupational History   Not on file  Tobacco Use   Smoking status: Never   Smokeless tobacco: Never  Vaping Use   Vaping Use: Never used  Substance and Sexual Activity   Alcohol use: No   Drug use: Never   Sexual activity: Not on file  Other Topics Concern   Not on file  Social History Narrative   Not on file   Social Determinants of Health   Financial Resource Strain: Not on file  Food Insecurity: Not on file  Transportation Needs: Not on file  Physical Activity: Not on file  Stress: Not on file  Social Connections: Not on file  Intimate Partner Violence: Not on file    FAMILY HISTORY: Family History  Problem Relation Age of Onset   Diabetes Daughter    Diabetes Son    Dementia Mother    Cancer Father    Esophageal cancer Sister    Brain cancer Brother     ALLERGIES:  is allergic to benazepril, lisinopril, tolmetin, and nsaids.  MEDICATIONS:  Current Outpatient Medications  Medication Sig Dispense Refill   acyclovir (ZOVIRAX) 400 MG tablet Take 1 tablet (400 mg total) by mouth 2 (two) times daily. 60 tablet 5   albuterol (VENTOLIN HFA) 108 (90 Base) MCG/ACT inhaler Inhale 2 puffs into the lungs every 4 (four) hours as needed for wheezing or shortness of breath.     amLODipine (NORVASC) 5 MG tablet Take 5 mg by mouth daily.     Ascorbic Acid (VITAMIN C) 500 MG CAPS Take 500 mcg by mouth daily. 30 capsule 1   aspirin EC 81 MG tablet Take 1 tablet (81 mg total) by mouth daily. Swallow whole. 30 tablet 0   Continuous Blood Gluc Sensor (FREESTYLE LIBRE 14 DAY SENSOR) MISC SMARTSIG:1 Kit(s) Topical Every 2 Weeks     dexamethasone (DECADRON) 4 MG tablet Take 5 tablets (20 mg total) by mouth once a week. 60 tablet 0   EPINEPHrine 0.3 mg/0.3 mL IJ SOAJ injection Inject 0.3 mg into the muscle as needed.     ferrous sulfate 325 (65 FE) MG tablet Take 1 tablet (325 mg total) by mouth 3 (three) times daily with  meals. 90 tablet 3   furosemide (LASIX) 20 MG tablet Take 20 mg by mouth daily. Take in AM     gabapentin (NEURONTIN) 100 MG capsule Take 100 mg by mouth at bedtime.     insulin glargine (LANTUS SOLOSTAR) 100 UNIT/ML Solostar Pen Inject 50 Units into the skin daily.     insulin lispro (HUMALOG) 100 UNIT/ML injection Inject 10 Units into the skin daily before supper.     loperamide (IMODIUM) 2 MG capsule Take 1 capsule (2 mg total) by mouth See admin instructions. Initial: 4 mg, followed by 2 mg after each loose stool, maximum 16 mg within 24 hours. Magness  capsule 0   megestrol (MEGACE) 40 MG tablet Take 40 mg by mouth 2 (two) times daily.     metFORMIN (GLUCOPHAGE) 500 MG tablet Take 500 mg by mouth daily with breakfast.     metoprolol succinate (TOPROL-XL) 50 MG 24 hr tablet Take 50 mg by mouth daily.     omeprazole (PRILOSEC) 40 MG capsule Take 40 mg by mouth daily.     potassium chloride SA (KLOR-CON M) 20 MEQ tablet Take 1 tablet (20 mEq total) by mouth 2 (two) times daily. 60 tablet 1   sertraline (ZOLOFT) 25 MG tablet Take 50 mg by mouth daily.     lenalidomide (REVLIMID) 10 MG capsule Take 1 capsule (10 mg total) by mouth daily. Take for 21 days, then hold for 7 days. Repeat every 28 days. 21 capsule 0   No current facility-administered medications for this visit.     PHYSICAL EXAMINATION: ECOG PERFORMANCE STATUS: 2 - Symptomatic, <50% confined to bed Vitals:   04/27/21 0926  BP: (!) 156/81  Pulse: 98  Temp: 97.8 F (36.6 C)   Filed Weights   04/27/21 0926  Weight: 132 lb 8 oz (60.1 kg)    Physical Exam Constitutional:      General: She is not in acute distress.    Appearance: She is ill-appearing.  HENT:     Head: Normocephalic and atraumatic.  Eyes:     General: No scleral icterus. Cardiovascular:     Rate and Rhythm: Normal rate and regular rhythm.     Heart sounds: Murmur heard.  Pulmonary:     Effort: Pulmonary effort is normal. No respiratory distress.     Breath  sounds: No wheezing.  Abdominal:     General: Bowel sounds are normal. There is distension.     Palpations: Abdomen is soft.  Musculoskeletal:        General: No deformity. Normal range of motion.     Cervical back: Normal range of motion and neck supple.     Comments: Bilateral lower extremities +1 edema  Skin:    General: Skin is warm and dry.     Findings: No erythema or rash.  Neurological:     Mental Status: She is alert. Mental status is at baseline.     Cranial Nerves: No cranial nerve deficit.     Coordination: Coordination normal.  Psychiatric:        Mood and Affect: Mood normal.    LABORATORY DATA:  I have reviewed the data as listed Lab Results  Component Value Date   WBC 5.8 04/27/2021   HGB 9.8 (L) 04/27/2021   HCT 27.9 (L) 04/27/2021   MCV 77.3 (L) 04/27/2021   PLT 472 (H) 04/27/2021   Recent Labs    03/02/21 0821 03/30/21 0909 04/27/21 0809  NA 134* 139 138  K 4.1 3.8 3.1*  CL 110 108 113*  CO2 16* 19* 18*  GLUCOSE 377* 260* 90  BUN 24* 26* 33*  CREATININE 0.96 0.93 1.07*  CALCIUM 8.3* 8.7* 8.2*  GFRNONAA >60 >60 54*  PROT 5.4* 5.5* 5.7*  ALBUMIN 2.2* 2.2* 2.5*  AST 33 48* 36  ALT 33 49* 44  ALKPHOS 716* 931* 769*  BILITOT 2.2* 2.7* 4.3*    Iron/TIBC/Ferritin/ %Sat    Component Value Date/Time   IRON 23 (L) 02/16/2021 1042   TIBC 316 02/16/2021 1042   FERRITIN 28 02/16/2021 1042   IRONPCTSAT 7 (L) 02/16/2021 1042     08/25/2019, platelet count 491, WBC  7.5, hemoglobin 12 Creatinine 1.58, EGFR 37, calcium 10.8, albumin 4.2 Negative hepatitis B surface antigen, hepatitis B core antibody, Negative hepatitis C 08/05/2019, A1c 11.2   RADIOGRAPHIC STUDIES: I have personally reviewed the radiological images as listed and agreed with the findings in the report. CT HEAD WO CONTRAST (5MM)  Result Date: 03/03/2021 CLINICAL DATA:  Recent fall, head injury over the left orbit EXAM: CT HEAD WITHOUT CONTRAST TECHNIQUE: Contiguous axial images  were obtained from the base of the skull through the vertex without intravenous contrast. COMPARISON:  None. FINDINGS: Brain: Atrophy and minor white matter microvascular ischemic changes. No acute intracranial hemorrhage, mass lesion, definite infarction, midline shift, herniation, or hydrocephalus. Cisterns are patent. Cerebellar atrophy as well. Vascular: No hyperdense vessel or unexpected calcification. Skull: Normal. Negative for fracture or focal lesion. Sinuses/Orbits: No acute finding. Other: None. IMPRESSION: Atrophy and white matter microvascular ischemic changes. No acute intracranial abnormality by noncontrast CT. Electronically Signed   By: Jerilynn Mages.  Shick M.D.   On: 03/03/2021 11:53   MR Abdomen W Wo Contrast  Result Date: 04/07/2021 CLINICAL DATA:  Abdominal pain and distention. Possible pancreatic mass on recent ultrasound. EXAM: MRI ABDOMEN WITHOUT AND WITH CONTRAST TECHNIQUE: Multiplanar multisequence MR imaging of the abdomen was performed both before and after the administration of intravenous contrast. CONTRAST:  21mL GADAVIST GADOBUTROL 1 MMOL/ML IV SOLN COMPARISON:  Ultrasound on 03/03/2021 and MRI on 04/07/2020 FINDINGS: Image degradation due to motion artifact and ascites noted. Lower chest: No acute findings. Hepatobiliary: Cirrhotic liver morphology again noted. A 13 mm cyst is again seen in the left hepatic lobe, without significant change. No hepatic masses are identified. Several tiny gallstones are again noted, however there is no evidence of cholecystitis or biliary ductal dilatation. Pancreas: A simple bilobed cystic lesion is seen in the pancreatic head measuring 2.3 x 1.6 cm on image 30/3. A simple cystic lesion is also seen in the pancreatic tail measuring 1.8 x 1.3 cm on image 27/3. 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 No evidence of pancreatic ductal dilatation. Spleen:  Within normal limits in size and  appearance. Adrenals/Urinary Tract: No masses identified. No evidence of hydronephrosis. Stomach/Bowel: Small hiatal hernia noted. Mild diffuse small bowel wall thickening and mesenteric edema, likely due to hypoalbuminemia. Vascular/Lymphatic: No pathologically enlarged lymph nodes identified. No acute vascular findings. Other:  Mild ascites.  Diffuse mesenteric and body wall edema. Musculoskeletal:  No suspicious bone lesions identified. 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 Recommend continued follow-up by MRI or CT in 6 months. Hepatic cirrhosis. No evidence of hepatic neoplasm. Mild ascites, diffuse mesenteric and body wall edema, and small hiatal hernia. Diffuse small bowel wall thickening, likely due to hypoalbuminemia. Cholelithiasis. No radiographic evidence of cholecystitis or biliary ductal dilatation. Electronically Signed   By: Marlaine Hind M.D.   On: 04/07/2021 12:17   US Abdomen Complete  Result Date: 03/03/2021 CLINICAL DATA:  Abdominal distension EXAM: ABDOMEN ULTRASOUND COMPLETE COMPARISON:  None. FINDINGS: Gallbladder: Gallstones. No wall thickening visualized. No sonographic Murphy sign noted by sonographer. Common bile duct: Diameter: 5 mm Liver: No focal lesion identified. Coarse, nodular contour of the liver. Increased parenchymal echogenicity. Portal vein is patent on color Doppler imaging with normal direction of blood flow towards the liver. IVC: No abnormality visualized. Pancreas: Possible hypoechoic mass in the pancreatic tail measuring 1.8 x 1.2  x 1.4 cm. Spleen: Size and appearance within normal limits. Right Kidney: Length: 10.2 cm. Echogenicity within normal limits. No mass or hydronephrosis visualized. Left Kidney: Length: 10.1 cm. Echogenicity within normal limits. No mass or hydronephrosis visualized. Abdominal aorta: No aneurysm visualized.  Other findings: Small volume perihepatic ascites. IMPRESSION: 1. Possible hypoechoic mass in the pancreatic tail measuring 1.8 x 1.2 x 1.4 cm. Recommend multiphasic contrast enhanced pancreatic protocol CT or MRI to further evaluate. 2. 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 3. Cholelithiasis. Electronically Signed   By: Delanna Ahmadi M.D.   On: 03/03/2021 12:22       ASSESSMENT & PLAN:  1. Light chain (AL) amyloidosis (HCC)   2. Encounter for antineoplastic chemotherapy   3. Iron deficiency anemia due to chronic blood loss   4. 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[place])   5. Vaginal bleeding   6. Hepatic cirrhosis, unspecified hepatic cirrhosis type, unspecified whether ascites present ([place])   7. Hyperbilirubinemia   8. Loss of weight   9. Hypoalbuminemia   10. Pancreatic cyst   11. Hypokalemia     # AL-Amyloidosis Liver involvement, possible kidney involvement-on second line treatment with daratumumab Revlimid dexamethasone. Labs are reviewed and discussed with patient. Proceed with daratumumab. Proceed with Revlimid 10 mg 3 weeks on 1 week off when she receives supply. Recommend patient to continue dexamethasone weekly. Amyloidosis labs were not able to be obtained after failed multiple attempts.  Patient will be rescheduled for another lab encounter on a different day.  #Iron deficiency anemia due to ongoing vaginal blood loss. Previously received blood transfusion.  She has had IV Venofer treatments. Hemoglobin has improved to 9.8, MCV 77.3.  Continue oral iron supplementation 325 mg 3 times daily.   #Elevated LFT, due to amyloidosis liver involvement-hepatic cirrhosis. Alkaline phosphatase has decreased to 769.  Increase of bilirubin 4.3. Patient has not followed up with gastroenterology/hepatology actively.  They are interested in seeing local gastroenterology physician.  Will refer patient to establish care with [facility]  gastroenterology.  # Uncontrolled DM, weekly Dexamethasone use.  Today blood glucose level was 90. Continue follow-up with primary care provider for diabetes management.  # vaginal bleeding. ? Involvedness of amyloidosis vs other etiologies.  I urged patient to further consider gynecology recommendation about D&C procedure.  This may help reduce/stop vaginal bleeding. Normal PT, PTT, and Factor X activity is pending.  #Hypokalemia, potassium 3.1.  Patient reports being compliant with potassium chloride 66meq daily. Recommend patient to increase potassium to twice daily.  New prescription was sent to pharmacy.  #Semiformed soft stools.  Patient denies bowel movement being watery loose. Potentially secondary to Revlimid use.  Recommend patient to try Imodium as needed.  Detailed instructions were reviewed with the patient and her daughter.  #Cystic pancreatic cyst, MRI findings were reviewed and discussed with patient. Possible pseudocyst, less likely cystic pancreatic neoplasm.  Given her poor prognosis from amyloidosis, recommend observation.  #Malnutrition, weight loss, hypoalbuminemia Recommend patient continue follow-up with nutritionist.  Encourage nutrition supplements.  #Goals of care, prognosis is poor.  Recommend patient continue follow-up with palliative care service.  We spent sufficient time to discuss many aspect of care, questions were answered to patient's satisfaction.  Lab MD 4 weeks lab MD Daratumumab/ Revlimid/Venofer, also see pallaitve care service.   cc Leonel Ramsay, MD   [name], MD, PhD  04/27/2021

## 2021-04-27 NOTE — Progress Notes (Signed)
Potassium 3.1. MD notified. 10 meg of potassium to be given IV over 1 hour per Dr. Tasia Catchings. MD has seen all lab results today and states that it is ok to proceed with treatment.

## 2021-04-27 NOTE — Patient Instructions (Signed)
[facility]  Discharge Instructions: ?Thank you for choosing [facility] to provide your oncology and hematology care.  ?If you have a lab appointment with the Cancer Center, please go directly to the Cancer Center and check in at the registration area. ? ?Wear comfortable clothing and clothing appropriate for easy access to any Portacath or PICC line.  ? ?We strive to give you quality time with your provider. You may need to reschedule your appointment if you arrive late (15 or more minutes).  Arriving late affects you and other patients whose appointments are after yours.  Also, if you miss three or more appointments without notifying the office, you may be dismissed from the clinic at the provider?s discretion.    ?  ?For prescription refill requests, have your pharmacy contact our office and allow 72 hours for refills to be completed.   ? ?  ?To help prevent nausea and vomiting after your treatment, we encourage you to take your nausea medication as directed. ? ?BELOW ARE SYMPTOMS THAT SHOULD BE REPORTED IMMEDIATELY: ?*FEVER GREATER THAN 100.4 F (38 ?C) OR HIGHER ?*CHILLS OR SWEATING ?*NAUSEA AND VOMITING THAT IS NOT CONTROLLED WITH YOUR NAUSEA MEDICATION ?*UNUSUAL SHORTNESS OF BREATH ?*UNUSUAL BRUISING OR BLEEDING ?*URINARY PROBLEMS (pain or burning when urinating, or frequent urination) ?*BOWEL PROBLEMS (unusual diarrhea, constipation, pain near the anus) ?TENDERNESS IN MOUTH AND THROAT WITH OR WITHOUT PRESENCE OF ULCERS (sore throat, sores in mouth, or a toothache) ?UNUSUAL RASH, SWELLING OR PAIN  ?UNUSUAL VAGINAL DISCHARGE OR ITCHING  ? ?Items with * indicate a potential emergency and should be followed up as soon as possible or go to the Emergency Department if any problems should occur. ? ?Please show the CHEMOTHERAPY ALERT CARD or IMMUNOTHERAPY ALERT CARD at check-in to the Emergency Department and triage nurse. ? ?Should you have questions after your visit  or need to cancel or reschedule your appointment, please contact [facility]  [phone] and follow the prompts.  Office hours are 8:00 a.m. to 4:30 p.m. Monday - Friday. Please note that voicemails left after 4:00 p.m. may not be returned until the following business day.  We are closed weekends and major holidays. You have access to a nurse at all times for urgent questions. Please call the main number to the clinic [phone] and follow the prompts. ? ?For any non-urgent questions, you may also contact your provider using MyChart. We now offer e-Visits for anyone 18 and older to request care online for non-urgent symptoms. For details visit [email or web address]. ?  ?Also download the MyChart app! Go to the app store, search "MyChart", open the app, select [facility], and log in with your MyChart username and password. ? ?Due to Covid, a mask is required upon entering the hospital/clinic. If you do not have a mask, one will be given to you upon arrival. For doctor visits, patients may have 1 support person aged 18 or older with them. For treatment visits, patients cannot have anyone with them due to current Covid guidelines and our immunocompromised population.  ?

## 2021-04-27 NOTE — Progress Notes (Signed)
[facility]  Telephone:([phone] [phone]   Name: Nancy Rodriguez Date: 04/27/2021 MRN: [id number]  DOB: 1946/04/06  Patient Care Team: Leonel Ramsay, MD as PCP - General (Infectious Diseases) End, [name], MD as PCP - Cardiology (Cardiology)    REASON FOR CONSULTATION: Nancy Rodriguez is a 76 y.o. female with multiple medical problems including monoclonal gammopathy, CKD stage 3b, anemia, RA, diabetes, cirrhosis, and AL amyloidosis.  Patient declined bone marrow transplant and was started on Dara CyBorD and then later rotated to [facility] RD chemotherapy.  Palliative care was consulted to help address goals.  SOCIAL HISTORY:     reports that she has never smoked. She has never used smokeless tobacco. She reports that she does not drink alcohol and does not use drugs.  Patient is widowed.  She currently lives at home with her daughter.  In total, patient has 4 sons and 2 daughters.  Patient previously lived in [place] and was an Mining engineer at [employer].  ADVANCE DIRECTIVES:  Does not have  CODE STATUS:   PAST MEDICAL HISTORY: Past Medical History:  Diagnosis Date   Anemia    Anemia in chronic kidney disease   Chronic kidney disease    Stage 3b chronic kidney disease   Diabetes mellitus without complication (HCC)    Hypercholesterolemia    Hypertension    MGUS (monoclonal gammopathy of unknown significance)    Osteoarthritis    Rheumatoid arthritis ([place])     PAST SURGICAL HISTORY:  Past Surgical History:  Procedure Laterality Date   HERNIA REPAIR     PARATHYROIDECTOMY      HEMATOLOGY/ONCOLOGY HISTORY:  Oncology History   No history exists.    ALLERGIES:  is allergic to benazepril, lisinopril, tolmetin, and nsaids.  MEDICATIONS:  Current Outpatient Medications  Medication Sig Dispense Refill   acyclovir (ZOVIRAX) 400 MG tablet Take 1 tablet (400 mg total) by mouth 2 (two) times daily.  60 tablet 5   albuterol (VENTOLIN HFA) 108 (90 Base) MCG/ACT inhaler Inhale 2 puffs into the lungs every 4 (four) hours as needed for wheezing or shortness of breath.     amLODipine (NORVASC) 5 MG tablet Take 5 mg by mouth daily.     Ascorbic Acid (VITAMIN C) 500 MG CAPS Take 500 mcg by mouth daily. 30 capsule 1   aspirin EC 81 MG tablet Take 1 tablet (81 mg total) by mouth daily. Swallow whole. 30 tablet 0   Continuous Blood Gluc Sensor (FREESTYLE LIBRE 14 DAY SENSOR) MISC SMARTSIG:1 Kit(s) Topical Every 2 Weeks     dexamethasone (DECADRON) 4 MG tablet Take 5 tablets (20 mg total) by mouth once a week. 60 tablet 0   EPINEPHrine 0.3 mg/0.3 mL IJ SOAJ injection Inject 0.3 mg into the muscle as needed.     ferrous sulfate 325 (65 FE) MG tablet Take 1 tablet (325 mg total) by mouth 3 (three) times daily with meals. 90 tablet 3   furosemide (LASIX) 20 MG tablet Take 20 mg by mouth daily. Take in AM     gabapentin (NEURONTIN) 100 MG capsule Take 100 mg by mouth at bedtime.     insulin glargine (LANTUS SOLOSTAR) 100 UNIT/ML Solostar Pen Inject 50 Units into the skin daily.     insulin lispro (HUMALOG) 100 UNIT/ML injection Inject 10 Units into the skin daily before supper.     lenalidomide (REVLIMID) 10 MG capsule Take 1 capsule (10 mg total) by  mouth daily. Take for 21 days, then hold for 7 days. Repeat every 28 days. 21 capsule 0   loperamide (IMODIUM) 2 MG capsule Take 1 capsule (2 mg total) by mouth See admin instructions. Initial: 4 mg, followed by 2 mg after each loose stool, maximum 16 mg within 24 hours. 60 capsule 0   megestrol (MEGACE) 40 MG tablet Take 40 mg by mouth 2 (two) times daily.     metFORMIN (GLUCOPHAGE) 500 MG tablet Take 500 mg by mouth daily with breakfast.     metoprolol succinate (TOPROL-XL) 50 MG 24 hr tablet Take 50 mg by mouth daily.     omeprazole (PRILOSEC) 40 MG capsule Take 40 mg by mouth daily.     potassium chloride SA (KLOR-CON M) 20 MEQ tablet Take 1 tablet (20 mEq  total) by mouth 2 (two) times daily. 60 tablet 1   sertraline (ZOLOFT) 25 MG tablet Take 50 mg by mouth daily.     No current facility-administered medications for this visit.   Facility-Administered Medications Ordered in Other Visits  Medication Dose Route Frequency Provider Last Rate Last Admin   0.9 %  sodium chloride infusion   Intravenous Continuous [name], MD 10 mL/hr at 04/27/21 1058 New Bag at 04/27/21 1058   daratumumab-hyaluronidase-fihj (DARZALEX FASPRO) 1800-30000 MG-UT/15ML chemo SQ injection 1,800 mg  1,800 mg Subcutaneous Once [name], MD       potassium chloride 10 mEq in 100 mL IVPB  10 mEq Intravenous Once [name], MD 100 mL/hr at 04/27/21 1059 10 mEq at 04/27/21 1059    VITAL SIGNS: There were no vitals taken for this visit. There were no vitals filed for this visit.  Estimated body mass index is 23.47 kg/m as calculated from the following:   Height as of 03/14/21: $RemoveBefo'5\' 3"'xOObosnZGEu$  (1.6 m).   Weight as of an earlier encounter on 04/27/21: 132 lb 8 oz (60.1 kg).  LABS: CBC:    Component Value Date/Time   WBC 5.8 04/27/2021 0809   HGB 9.8 (L) 04/27/2021 0809   HCT 27.9 (L) 04/27/2021 0809   PLT 472 (H) 04/27/2021 0809   MCV 77.3 (L) 04/27/2021 0809   NEUTROABS 3.3 04/27/2021 0809   LYMPHSABS 1.0 04/27/2021 0809   MONOABS 1.2 (H) 04/27/2021 0809   EOSABS 0.1 04/27/2021 0809   BASOSABS 0.1 04/27/2021 0809   Comprehensive Metabolic Panel:    Component Value Date/Time   NA 138 04/27/2021 0809   K 3.1 (L) 04/27/2021 0809   CL 113 (H) 04/27/2021 0809   CO2 18 (L) 04/27/2021 0809   BUN 33 (H) 04/27/2021 0809   CREATININE 1.07 (H) 04/27/2021 0809   GLUCOSE 90 04/27/2021 0809   CALCIUM 8.2 (L) 04/27/2021 0809   AST 36 04/27/2021 0809   ALT 44 04/27/2021 0809   ALKPHOS 769 (H) 04/27/2021 0809   BILITOT 4.3 (H) 04/27/2021 0809   PROT 5.7 (L) 04/27/2021 0809   ALBUMIN 2.5 (L) 04/27/2021 0809    RADIOGRAPHIC STUDIES: MR Abdomen W Wo Contrast  Result Date:  04/07/2021 CLINICAL DATA:  Abdominal pain and distention. Possible pancreatic mass on recent ultrasound. EXAM: MRI ABDOMEN WITHOUT AND WITH CONTRAST TECHNIQUE: Multiplanar multisequence MR imaging of the abdomen was performed both before and after the administration of intravenous contrast. CONTRAST:  29mL GADAVIST GADOBUTROL 1 MMOL/ML IV SOLN COMPARISON:  Ultrasound on 03/03/2021 and MRI on 04/07/2020 FINDINGS: Image degradation due to motion artifact and ascites noted. Lower chest: No acute findings. Hepatobiliary: Cirrhotic liver morphology again  noted. A 13 mm cyst is again seen in the left hepatic lobe, without significant change. No hepatic masses are identified. Several tiny gallstones are again noted, however there is no evidence of cholecystitis or biliary ductal dilatation. Pancreas: A simple bilobed cystic lesion is seen in the pancreatic head measuring 2.3 x 1.6 cm on image 30/3. A simple cystic lesion is also seen in the pancreatic tail measuring 1.8 x 1.3 cm on image 27/3. 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 No evidence of pancreatic ductal dilatation. Spleen:  Within normal limits in size and appearance. Adrenals/Urinary Tract: No masses identified. No evidence of hydronephrosis. Stomach/Bowel: Small hiatal hernia noted. Mild diffuse small bowel wall thickening and mesenteric edema, likely due to hypoalbuminemia. Vascular/Lymphatic: No pathologically enlarged lymph nodes identified. No acute vascular findings. Other:  Mild ascites.  Diffuse mesenteric and body wall edema. Musculoskeletal:  No suspicious bone lesions identified. 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 Recommend continued follow-up by MRI or CT in 6 months. Hepatic cirrhosis. No evidence of hepatic neoplasm. Mild ascites, diffuse  mesenteric and body wall edema, and small hiatal hernia. Diffuse small bowel wall thickening, likely due to hypoalbuminemia. Cholelithiasis. No radiographic evidence of cholecystitis or biliary ductal dilatation. Electronically Signed   By: Marlaine Hind M.D.   On: 04/07/2021 12:17     PERFORMANCE STATUS (ECOG) : 1 - Symptomatic but completely ambulatory  Review of Systems Unless otherwise noted, a complete review of systems is negative.  Physical Exam General: NAD Pulmonary: Unlabored Extremities: no edema, no joint deformities Skin: no rashes Neurological: Weakness but otherwise nonfocal  IMPRESSION: Patient was referred to reestablish palliative care.  She was seen today in infusion while she received chemotherapy.  Case discussed with Dr. Tasia Catchings.  Care has been complicated recently by vaginal bleeding and patient is being evaluated by gynecology with consideration for D&C.  Patient is also noted to have liver involvement from amyloidosis symptomatically, patient has had some recent diarrhea and is being referred to GI.  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  Overall, prognosis is poor.  Patient reported feeling like she was doing well and had received a "good report" and denies significant symptomatic concerns today but she is frail appearing.  Patient reports that her goals are aligned with continued treatment.  I met with patient's daughter who verbalized caregiver strain.  All of her siblings are out of state and falls on her to navigate her own healthcare issues in addition to being the primary caregiver for the patient.  Introduced a caregiver support group.  We will consult LCSW to explore counseling/resources.  We will also send a referral to home palliative care.  I sent patient home with a MOST form to discuss with family.  PLAN: -Continue current scope of treatment -Referrals for palliative care and LCSW -MOST Form reviewed -RTC 1 month  Case and plan  discussed with Dr. Tasia Catchings.  Patient expressed understanding and was in agreement with this plan. She also understands that She can call the clinic at any time with any questions, concerns, or complaints.     Time Total: 30 minutes  Visit consisted of counseling and education dealing with the complex and emotionally intense issues of symptom management and palliative care in the setting of serious and potentially life-threatening illness.Greater than 50%  of this time was  was spent counseling and coordinating care related to the above assessment and plan.  Signed by: Altha Harm, PhD, NP-C

## 2021-04-27 NOTE — Telephone Encounter (Signed)
Pt taking Furosemide 20 mg qd. Last filled by Historical Provider. Pt overdue for 6 wk f/u. Please advise if ok to refill.

## 2021-04-27 NOTE — Progress Notes (Signed)
Nutrition Follow-up:  Patient with AL lambda amyloidosis.  Patient on revlimid, daratumumab, dexamethasone.    Met with patient's daughter in consult room and met with patient during infusion. Daughter reports good appetite most of the time, at times eats less.  Likes most foods, eggs, beef, chicken (baked).  Patient has been drinking less of oral nutrition supplements shakes recently.   NFPE:  Buccal fat pads:moderate Tricep: mild/moderate Ribs: moderate Temple: normal Clavicle:moderate Scapula: moderate Dorsal Hand: moderate Thigh: mild Calf: mild Knee: moderate  + Edema in both lower legs and feet : moderate  Medications: reviewed  Labs: K 3.1 (supplemented), BUN 33, creatinine 1.07, cal 8.2, albumin 2.5  Anthropometrics:   Weight 132 lb today (edema in both legs)  139 lb 14.4 oz on 11/17 (edema present) 129 lb 6.4 oz on 9/9 142 lb on 5/25 147 lb on 05/11/2020  10% weight loss in the last year, concerning (edema hard to determine try weight loss   NUTRITION DIAGNOSIS: Unintentional weight loss continues but hard to determine due to edema (fluid medication adjusted yesterday by Nephrology)   MALNUTRITION DIAGNOSIS: Patient meets criteria for moderate malnutrition in setting of chronic illness as evidenced by moderate fat and muscle mass loss and moderate edema.     INTERVENTION:  Discussed with daughter and patient importance of increasing calories and protein to maintain nutrition. Daughter and patient as well concerned about excess sodium and sugar.  Discussed with daughter ways to add heart healthy fats to diet (ie olive oil, unsalted nuts and nut butters, avocado, fatty fish).  Handouts given on High Calorie, High Protein Diet and Snack List.   Discussed with daughter and patient would not recommend strict diet restrictions at this time but would rather see adjustments being made in medication.   Discussed higher calorie shakes with daughter and patient (ie ensure  plus, boost plus, ensure complete, equate plus)and that they are higher in sugar but provide 350 calories or more vs 150-190 calories in lower sugar options.  Once again would rather see adjustments being made in medication vs oral intake.  Discussed with daughter importance of including patient in meal selection and snack selection because if patient does not like food options and does not eat then not going to be able to improve nutrition.  Encouraged protein food at every meal/snack.   Contact information provided  MONITORING, EVALUATION, GOAL: weight loss, intake, blood glucse   NEXT VISIT: Phone call Tuesday, Feb 14  Jaaliyah Lucatero B. Zenia Resides, [place], [place] Registered Dietitian [phone] (mobile)'

## 2021-04-27 NOTE — Progress Notes (Signed)
[place] Work  Clinical Social Work was referred by Sharion Dove NP for assessment of psychosocial needs.  Clinical Social Worker contacted caregiver by phone  to offer support and assess for needs.  Referral stated patient is experiencing symptoms of depression and main caregiver, daughter Rachele Lamaster [phone], is experiencing care giver strain.  Patient was in infusion today and CSW will wait until tomorrow to speak with patient.  CSW called patient's caregiver, Ms. ALVA KUENZEL, but was unable to leave a voicemail because voicemail is full. CSW will attempt to call patient and caregiver tomorrow 04/28/2020.      Adelene Amas, [place]       First attempt

## 2021-04-27 NOTE — Telephone Encounter (Signed)
Pt overdue for follow up. Pt scheduled for follow up 05/18/21. Meds will need to be reviewed at office visit.

## 2021-04-28 ENCOUNTER — Other Ambulatory Visit: Payer: Self-pay | Admitting: Oncology

## 2021-04-29 ENCOUNTER — Encounter: Payer: Self-pay | Admitting: Oncology

## 2021-04-29 LAB — FACTOR 10 ASSAY: Factor X Activity: 88 % (ref 76–183)

## 2021-05-02 ENCOUNTER — Inpatient Hospital Stay: Payer: Medicare Other

## 2021-05-03 ENCOUNTER — Encounter: Payer: Self-pay | Admitting: Licensed Clinical Social Worker

## 2021-05-03 NOTE — Progress Notes (Addendum)
[place] CSW Progress Note  Clinical Education officer, museum contacted caregiver by phone to follow-up on referral and initial phone call.  CSW left voicemail with contact information and request for return call.    Ryley Teater , LCSW  Second attempt

## 2021-05-05 ENCOUNTER — Telehealth: Payer: Self-pay | Admitting: Nurse Practitioner

## 2021-05-05 NOTE — Telephone Encounter (Signed)
Attempted to contact patient to schedule Palliative Consult, no answer and unable to leave message due to mailbox was full.  I then attempted to reach patient's daughter [name], with no answer, left VM with reason for call along with my name and call back number, requesting a return call

## 2021-05-11 ENCOUNTER — Other Ambulatory Visit: Payer: Self-pay | Admitting: Pharmacist

## 2021-05-11 ENCOUNTER — Encounter: Payer: Self-pay | Admitting: Licensed Clinical Social Worker

## 2021-05-11 NOTE — Progress Notes (Unsigned)
[place] Work  Initial Assessment   Nancy Rodriguez is a 76 y.o. year old female contacted by phone. Clinical Social Work was referred by Billey Chang NP for assessment of psychosocial needs. CSW spoke with patient's daughter and main care giver Nancy Rodriguez, Nancy Rodriguez [phone] [facility])  SDOH (Social Determinants of Health) assessments performed: No   Distress Screen completed: No No flowsheet data found.    Family/Social Information:  Housing Arrangement: patient lives with daughter and caregiver Nancy Rodriguez Family members/support persons in your life? Family and [employer] concerns: no  Employment: Retired. Income source: Paediatric nurse concerns: No Type of concern: None Food access concerns: no Religious or spiritual practice: yes Services Currently in place:  Home health PT and OT  Coping/ Adjustment to diagnosis: Patient understands treatment plan and what happens next? yes Concerns about diagnosis and/or treatment: I'm not especially worried about anything Patient reported stressors: Adjusting to my illness Hopes and priorities: N/A Patient enjoys time with family/ friends Current coping skills/ strengths: Average or above average intelligence , Religious Affiliation , and Supportive family/friends     SUMMARY: Current SDOH Barriers:  ADL IADL limitations  Clinical Social Work Clinical Goal(s):  patient will work with SW to address concerns related to resources to assist caregiver  Interventions: Discussed common feeling and emotions when being diagnosed with cancer, and the importance of support during treatment Informed patient of the support team roles and support services at [facility] Provided [place] contact information and encouraged patient to call with any questions or concerns CSW spoke with patient's caregiver and daughter Nancy Rodriguez, who manages the patient's day-to-day care.  Ms. Star stated she is her patient's sole  caregiver and she is feeling the stressors of complete care giving and she was interested in receiving resource information .  CSW and Ms. Turnley discussed the difference between what Medicare and medicaid provide, HCPOA and Durable POA, long term care, respite care and community resources.  CSW sent Ms. Nickles information for Caregiver Connection group to [email or web address] [email or web address].  CSW encouraged Ms. Pingree to contact CSW if she had any questions or concerns.  Ms. Zamor stated she would discuss HCPOA and Durable POA with the patient, as well as Medicaid and to find out if she qualified.     Follow Up Plan:  Patient's caregiver will contact CSW with any concerns or questions Patient verbalizes understanding of plan: {yes/no:20286}    Adelene Amas , LCSW

## 2021-05-14 NOTE — Progress Notes (Signed)
Follow-up Outpatient Visit Date: 05/18/2021  Primary Care Provider: Leonel Ramsay, MD [place] [address]  Chief Complaint: Follow-up edema and amyloidosis  HPI:  Nancy Rodriguez is a 76 y.o. 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  I last saw her in June, at which time she reported improvement in leg edema following escalation of furosemide to 40 mg daily and with regular compression stocking use.  We agreed to decrease her standing furosemide to 20 mg daily, with additional dosing as needed for weight gain/swelling.  6 week follow-up was recommended, though she was lost to follow-up until today.  She was hospitalized at [facility] last month due to AKI and marked hyperglycemia complicated by a UTI and fall with head CT showing chronic-appearing subdural hematomas.  She had fallen several months earlier resulting in left shoulder dislocation.  Today, Nancy Rodriguez reports that she feels fairly well.  She denies chest pain, shortness of breath, palpitations, lightheadedness, orthopnea, and PND.  She has not had any headaches or further falls since returning home from the hospital.  She has started working with physical therapy.  Her daughter points out that Nancy Rodriguez has had some mild ankle swelling at times.  She is now on torsemide 20 mg daily as needed for weight gain/edema but has not received any since coming home from the hospital.  Her weight is down 10 pounds over the last 2-3 weeks.  --------------------------------------------------------------------------------------------------  Cardiovascular History & Procedures: Cardiovascular Problems: Question cardiac amyloidosis and abnormal EKG   Risk Factors: Hypertension, hyperlipidemia, diabetes mellitus, and age greater than 20    Cath/PCI: None   CV Surgery: None   EP Procedures and Devices: None   Non-Invasive Evaluation(s): Cardiac MRI (09/01/2020, [facility]): Normal LV size with mild to moderate basal sigmoid septal hypertrophy (up to 1.6 mm) with hyperdynamic left ventricular function (LVEF 71%).  No abnormal delayed hyperenhancement to suggest scarring or infiltration.  Normal RV size and function.  Mild biatrial enlargement.  Trileaflet aortic valve without stenosis or regurgitation.  Trivial mitral and mild tricuspid regurgitation.  Normal pericardium.  Small bilateral pleural effusions. TTE (06/01/2020): Normal LV size with mild asymmetric hypertrophy of the basal septum.  LVEF 60-65% with GLS -16.8%.  Diastolic parameters indeterminate.  Normal RV size and function.  Mild mitral regurgitation.  No obvious findings of cardiac amyloidosis. Exercise stress echocardiogram (04/01/2012, [facility]): Normal study.  Recent CV Pertinent Labs: Lab Results  Component Value Date   INR 1.2 04/27/2021   K 3.1 (L) 04/27/2021   BUN 33 (H) 04/27/2021   CREATININE 1.07 (H) 04/27/2021    Past medical and surgical history were reviewed and updated in EPIC.  Current Meds  Medication Sig   acyclovir (ZOVIRAX) 400 MG tablet Take 400 mg by mouth daily.   albuterol (VENTOLIN HFA) 108 (90 Base) MCG/ACT inhaler Inhale 2 puffs into the lungs every 4 (four) hours as needed for wheezing or shortness of breath.   amLODipine (NORVASC) 5 MG tablet Take 5 mg by mouth daily.   Continuous Blood Gluc Sensor (FREESTYLE LIBRE 14 DAY SENSOR) MISC SMARTSIG:1 Kit(s) Topical Every 2 Weeks   CVS VITAMIN C 500 MG tablet TAKE 1 TABLET BY MOUTH DAILY   dexamethasone (DECADRON) 4 MG tablet TAKE 5 TABLETS BY MOUTH ONCE A WEEK.   EPINEPHrine 0.3 mg/0.3 mL IJ SOAJ injection Inject 0.3  mg into the muscle as needed.   ferrous sulfate 325 (65 FE) MG tablet Take 325 mg by mouth daily with breakfast.   gabapentin (NEURONTIN) 100 MG capsule Take 100 mg by mouth at  bedtime.   insulin glargine (LANTUS SOLOSTAR) 100 UNIT/ML Solostar Pen Inject 15 Units into the skin at bedtime.   insulin lispro (HUMALOG) 100 UNIT/ML injection Inject into the skin 3 (three) times daily before meals. Sliding scale   lenalidomide (REVLIMID) 10 MG capsule Take 1 capsule (10 mg total) by mouth daily. Take for 21 days, then hold for 7 days. Repeat every 28 days.   loperamide (IMODIUM) 2 MG capsule Take 1 capsule (2 mg total) by mouth See admin instructions. Initial: 4 mg, followed by 2 mg after each loose stool, maximum 16 mg within 24 hours.   metoprolol succinate (TOPROL-XL) 50 MG 24 hr tablet Take 50 mg by mouth daily.   omeprazole (PRILOSEC) 40 MG capsule Take 40 mg by mouth daily.   potassium chloride SA (KLOR-CON M) 20 MEQ tablet Take 1 tablet (20 mEq total) by mouth 2 (two) times daily.   sertraline (ZOLOFT) 25 MG tablet Take 50 mg by mouth daily.   torsemide (DEMADEX) 20 MG tablet Take 20 mg by mouth daily as needed.   [DISCONTINUED] aspirin EC 81 MG tablet Take 1 tablet (81 mg total) by mouth daily. Swallow whole.    Allergies: Benazepril, Lisinopril, Tolmetin, and Nsaids  Social History   Tobacco Use   Smoking status: Never   Smokeless tobacco: Never  Vaping Use   Vaping Use: Never used  Substance Use Topics   Alcohol use: No   Drug use: Never    Family History  Problem Relation Age of Onset   Diabetes Daughter    Diabetes Son    Dementia Mother    Cancer Father    Esophageal cancer Sister    Brain cancer Brother     Review of Systems: A 12-system review of systems was performed and was negative except as noted in the HPI.  --------------------------------------------------------------------------------------------------  Physical Exam: BP 130/70 (BP Location: Right Arm, Patient Position: Sitting, Cuff Size: Normal)    Pulse 88    Ht 5' 3.5" (1.613 m)    Wt 123 lb (55.8 kg)    SpO2 99%    BMI 21.45 kg/m   General:  NAD.  Seated in wheelchair and  accompanied by her daughter. Neck: No JVD or HJR. Lungs: Clear to auscultation bilaterally without wheezes or crackles. Heart: Regular rate and rhythm with 2/6 systolic murmur.  No rubs or gallops. Abdomen: Soft, nontender, nondistended. Extremities: Trace pretibial edema bilaterally.  EKG: Normal sinus rhythm with anterolateral and inferior Q waves as well as lateral T wave inversions.  No significant change from prior tracings dating back as far as 12/01/2014.  Lab Results  Component Value Date   WBC 5.8 04/27/2021   HGB 9.8 (L) 04/27/2021   HCT 27.9 (L) 04/27/2021   MCV 77.3 (L) 04/27/2021   PLT 472 (H) 04/27/2021    Lab Results  Component Value Date   NA 138 04/27/2021   K 3.1 (L) 04/27/2021   CL 113 (H) 04/27/2021   CO2 18 (L) 04/27/2021   BUN 33 (H) 04/27/2021   CREATININE 1.07 (H) 04/27/2021   GLUCOSE 90 04/27/2021   ALT 44 04/27/2021    No results found for: CHOL, HDL, LDLCALC, LDLDIRECT, TRIG, CHOLHDL  --------------------------------------------------------------------------------------------------  ASSESSMENT AND PLAN: Amyloidosis and edema: Edema today is minimal  on exam, better than last year.  I think it is reasonable to continue with torsemide 20 mg daily as needed for obvious worsening edema or weight gain, particularly in the setting of recent hospitalization with AKI.  We will repeat an echocardiogram to ensure that cardiomyopathy has not developed in the setting of amyloidosis and ongoing chemotherapy.  Nancy Rodriguez should continue following with her oncology team and Dr. Ola Spurr.  Subdural hematomas: Hematomas were noted after falling in the hospital during recent admission for AKI and UTI in the setting of marked hyperglycemia.  Notes from [facility] suggests that subdurals were chronic in appearance.  No headaches or other neurologic deficits are reported by Nancy Rodriguez.  She has not had any further falls.  However, given finding of subdural hematomas with at  least 2 significant falls over the last year and absence of established ASCVD, I have recommended discontinuation of aspirin.  Hypertension: Blood pressure borderline elevated today (goal less than 130/80 in the setting of diabetes mellitus).  Continue current regimen of amlodipine and metoprolol.  Ongoing management of diabetes mellitus per Dr. Ola Spurr.  Follow-up: Return to clinic in 4-6 weeks (after completion of echocardiogram).  Nelva Bush, MD 05/18/2021 9:23 AM

## 2021-05-18 ENCOUNTER — Ambulatory Visit: Payer: Medicare Other | Admitting: Internal Medicine

## 2021-05-18 ENCOUNTER — Other Ambulatory Visit: Payer: Self-pay

## 2021-05-18 ENCOUNTER — Encounter: Payer: Self-pay | Admitting: Internal Medicine

## 2021-05-18 VITALS — BP 130/70 | HR 88 | Ht 63.5 in | Wt 123.0 lb

## 2021-05-18 DIAGNOSIS — I152 Hypertension secondary to endocrine disorders: Secondary | ICD-10-CM | POA: Diagnosis not present

## 2021-05-18 DIAGNOSIS — R609 Edema, unspecified: Secondary | ICD-10-CM

## 2021-05-18 DIAGNOSIS — E1159 Type 2 diabetes mellitus with other circulatory complications: Secondary | ICD-10-CM | POA: Diagnosis not present

## 2021-05-18 DIAGNOSIS — S065XAA Traumatic subdural hemorrhage with loss of consciousness status unknown, initial encounter: Secondary | ICD-10-CM | POA: Diagnosis not present

## 2021-05-18 DIAGNOSIS — E8581 Light chain (AL) amyloidosis: Secondary | ICD-10-CM

## 2021-05-18 NOTE — Patient Instructions (Signed)
Medication Instructions:   Your physician has recommended you make the following change in your medication:   STOP Aspirin  *If you need a refill on your cardiac medications before your next appointment, please call your pharmacy*   Lab Work:  None ordered  Testing/Procedures:  Your physician has requested that you have an echocardiogram. Echocardiography is a painless test that uses sound waves to create images of your heart. It provides your doctor with information about the size and shape of your heart and how well your hearts chambers and valves are working. This procedure takes approximately one hour. There are no restrictions for this procedure.   Follow-Up: At [facility], you and your health needs are our priority.  As part of our continuing mission to provide you with exceptional heart care, we have created designated Provider Care Teams.  These Care Teams include your primary Cardiologist (physician) and Advanced Practice Providers (APPs -  Physician Assistants and Nurse Practitioners) who all work together to provide you with the care you need, when you need it.  We recommend signing up for the patient portal called "MyChart".  Sign up information is provided on this After Visit Summary.  MyChart is used to connect with patients for Virtual Visits (Telemedicine).  Patients are able to view lab/test results, encounter notes, upcoming appointments, etc.  Non-urgent messages can be sent to your provider as well.   To learn more about what you can do with MyChart, go to [email or web address].    Your next appointment:    4 - 6 week(s) AFTER echo  The format for your next appointment:   In Person  Provider:   You may see Nelva Bush, MD or one of the following Advanced Practice Providers on your designated Care Team:   Murray Hodgkins, NP Christell Faith, PA-C Cadence Kathlen Mody, [place]

## 2021-05-19 ENCOUNTER — Telehealth: Payer: Self-pay | Admitting: Nurse Practitioner

## 2021-05-19 NOTE — Telephone Encounter (Signed)
Rec'd VM from daughter [name],  returned her call to possibly schedule Palliative Consult, no answer - left message requesting a return call.

## 2021-05-24 ENCOUNTER — Other Ambulatory Visit: Payer: Medicare Other

## 2021-05-29 ENCOUNTER — Inpatient Hospital Stay (HOSPITAL_BASED_OUTPATIENT_CLINIC_OR_DEPARTMENT_OTHER): Payer: Medicare Other | Admitting: Hospice and Palliative Medicine

## 2021-05-29 ENCOUNTER — Inpatient Hospital Stay (HOSPITAL_BASED_OUTPATIENT_CLINIC_OR_DEPARTMENT_OTHER): Payer: Medicare Other | Admitting: Oncology

## 2021-05-29 ENCOUNTER — Inpatient Hospital Stay: Payer: Medicare Other

## 2021-05-29 ENCOUNTER — Encounter: Payer: Self-pay | Admitting: Oncology

## 2021-05-29 ENCOUNTER — Other Ambulatory Visit: Payer: Self-pay

## 2021-05-29 ENCOUNTER — Inpatient Hospital Stay: Payer: Medicare Other | Attending: Oncology

## 2021-05-29 VITALS — BP 132/74 | HR 82 | Temp 97.9°F | Resp 18 | Wt 119.8 lb

## 2021-05-29 VITALS — BP 157/71 | HR 83 | Temp 97.5°F | Resp 18

## 2021-05-29 DIAGNOSIS — E876 Hypokalemia: Secondary | ICD-10-CM

## 2021-05-29 DIAGNOSIS — E859 Amyloidosis, unspecified: Secondary | ICD-10-CM | POA: Diagnosis present

## 2021-05-29 DIAGNOSIS — E8581 Light chain (AL) amyloidosis: Secondary | ICD-10-CM

## 2021-05-29 DIAGNOSIS — N1831 Chronic kidney disease, stage 3a: Secondary | ICD-10-CM

## 2021-05-29 DIAGNOSIS — D631 Anemia in chronic kidney disease: Secondary | ICD-10-CM | POA: Insufficient documentation

## 2021-05-29 DIAGNOSIS — Z5112 Encounter for antineoplastic immunotherapy: Secondary | ICD-10-CM | POA: Insufficient documentation

## 2021-05-29 DIAGNOSIS — Z79899 Other long term (current) drug therapy: Secondary | ICD-10-CM | POA: Insufficient documentation

## 2021-05-29 DIAGNOSIS — Z5111 Encounter for antineoplastic chemotherapy: Secondary | ICD-10-CM

## 2021-05-29 LAB — CBC WITH DIFFERENTIAL/PLATELET
Abs Immature Granulocytes: 0.06 10*3/uL (ref 0.00–0.07)
Basophils Absolute: 0 10*3/uL (ref 0.0–0.1)
Basophils Relative: 1 %
Eosinophils Absolute: 0.2 10*3/uL (ref 0.0–0.5)
Eosinophils Relative: 3 %
HCT: 27.7 % — ABNORMAL LOW (ref 36.0–46.0)
Hemoglobin: 9.7 g/dL — ABNORMAL LOW (ref 12.0–15.0)
Immature Granulocytes: 1 %
Lymphocytes Relative: 22 %
Lymphs Abs: 1.3 10*3/uL (ref 0.7–4.0)
MCH: 27.3 pg (ref 26.0–34.0)
MCHC: 35 g/dL (ref 30.0–36.0)
MCV: 78 fL — ABNORMAL LOW (ref 80.0–100.0)
Monocytes Absolute: 0.4 10*3/uL (ref 0.1–1.0)
Monocytes Relative: 7 %
Neutro Abs: 3.9 10*3/uL (ref 1.7–7.7)
Neutrophils Relative %: 66 %
Platelets: 466 10*3/uL — ABNORMAL HIGH (ref 150–400)
RBC: 3.55 MIL/uL — ABNORMAL LOW (ref 3.87–5.11)
RDW: 24.6 % — ABNORMAL HIGH (ref 11.5–15.5)
WBC: 5.8 10*3/uL (ref 4.0–10.5)
nRBC: 0 % (ref 0.0–0.2)

## 2021-05-29 LAB — COMPREHENSIVE METABOLIC PANEL
ALT: 54 U/L — ABNORMAL HIGH (ref 0–44)
AST: 44 U/L — ABNORMAL HIGH (ref 15–41)
Albumin: 2.5 g/dL — ABNORMAL LOW (ref 3.5–5.0)
Alkaline Phosphatase: 797 U/L — ABNORMAL HIGH (ref 38–126)
Anion gap: 7 (ref 5–15)
BUN: 29 mg/dL — ABNORMAL HIGH (ref 8–23)
CO2: 18 mmol/L — ABNORMAL LOW (ref 22–32)
Calcium: 8.2 mg/dL — ABNORMAL LOW (ref 8.9–10.3)
Chloride: 111 mmol/L (ref 98–111)
Creatinine, Ser: 1.01 mg/dL — ABNORMAL HIGH (ref 0.44–1.00)
GFR, Estimated: 58 mL/min — ABNORMAL LOW (ref >60)
Glucose, Bld: 215 mg/dL — ABNORMAL HIGH (ref 70–99)
Potassium: 3.5 mmol/L (ref 3.5–5.1)
Sodium: 136 mmol/L (ref 135–145)
Total Bilirubin: 4.3 mg/dL — ABNORMAL HIGH (ref 0.3–1.2)
Total Protein: 5.4 g/dL — ABNORMAL LOW (ref 6.5–8.1)

## 2021-05-29 MED ORDER — ACETAMINOPHEN 325 MG PO TABS
325.0000 mg | ORAL_TABLET | Freq: Once | ORAL | Status: AC
  Administered 2021-05-29: 325 mg via ORAL
  Filled 2021-05-29: qty 1

## 2021-05-29 MED ORDER — DEXAMETHASONE 4 MG PO TABS
20.0000 mg | ORAL_TABLET | Freq: Once | ORAL | Status: AC
  Administered 2021-05-29: 20 mg via ORAL
  Filled 2021-05-29: qty 5

## 2021-05-29 MED ORDER — DIPHENHYDRAMINE HCL 25 MG PO CAPS
50.0000 mg | ORAL_CAPSULE | Freq: Once | ORAL | Status: AC
  Administered 2021-05-29: 50 mg via ORAL
  Filled 2021-05-29: qty 2

## 2021-05-29 MED ORDER — DARATUMUMAB-HYALURONIDASE-FIHJ 1800-30000 MG-UT/15ML ~~LOC~~ SOLN
1800.0000 mg | Freq: Once | SUBCUTANEOUS | Status: AC
  Administered 2021-05-29: 1800 mg via SUBCUTANEOUS
  Filled 2021-05-29: qty 15

## 2021-05-29 NOTE — Patient Instructions (Signed)
[name] AT [place]  Discharge Instructions: Thank you for choosing [facility] to provide your oncology and hematology care.  If you have a lab appointment with the [place], please go directly to the [facility] and check in at the registration area.  Wear comfortable clothing and clothing appropriate for easy access to any Portacath or PICC line.   We strive to give you quality time with your provider. You may need to reschedule your appointment if you arrive late (15 or more minutes).  Arriving late affects you and other patients whose appointments are after yours.  Also, if you miss three or more appointments without notifying the office, you may be dismissed from the clinic at the providers discretion.      For prescription refill requests, have your pharmacy contact our office and allow 72 hours for refills to be completed.    Today you received the following chemotherapy and/or immunotherapy agents Darzalex       To help prevent nausea and vomiting after your treatment, we encourage you to take your nausea medication as directed.  BELOW ARE SYMPTOMS THAT SHOULD BE REPORTED IMMEDIATELY: *FEVER GREATER THAN 100.4 F (38 C) OR HIGHER *CHILLS OR SWEATING *NAUSEA AND VOMITING THAT IS NOT CONTROLLED WITH YOUR NAUSEA MEDICATION *UNUSUAL SHORTNESS OF BREATH *UNUSUAL BRUISING OR BLEEDING *URINARY PROBLEMS (pain or burning when urinating, or frequent urination) *BOWEL PROBLEMS (unusual diarrhea, constipation, pain near the anus) TENDERNESS IN MOUTH AND THROAT WITH OR WITHOUT PRESENCE OF ULCERS (sore throat, sores in mouth, or a toothache) UNUSUAL RASH, SWELLING OR PAIN  UNUSUAL VAGINAL DISCHARGE OR ITCHING   Items with * indicate a potential emergency and should be followed up as soon as possible or go to the Emergency Department if any problems should occur.  Please show the CHEMOTHERAPY ALERT CARD or IMMUNOTHERAPY ALERT CARD at check-in to  the Emergency Department and triage nurse.  Should you have questions after your visit or need to cancel or reschedule your appointment, please contact [facility] CANCER [place] AT [place]  [phone] and follow the prompts.  Office hours are 8:00 a.m. to 4:30 p.m. Monday - Friday. Please note that voicemails left after 4:00 p.m. may not be returned until the following business day.  We are closed weekends and major holidays. You have access to a nurse at all times for urgent questions. Please call the main number to the clinic [phone] and follow the prompts.  For any non-urgent questions, you may also contact your provider using MyChart. We now offer e-Visits for anyone 88 and older to request care online for non-urgent symptoms. For details visit [email or web address].   Also download the MyChart app! Go to the app store, search "MyChart", open the app, select , and log in with your MyChart username and password.  Due to Covid, a mask is required upon entering the hospital/clinic. If you do not have a mask, one will be given to you upon arrival. For doctor visits, patients may have 1 support person aged 58 or older with them. For treatment visits, patients cannot have anyone with them due to current Covid guidelines and our immunocompromised population.

## 2021-05-29 NOTE — Progress Notes (Signed)
Nancy Rodriguez  [phone] [phone]   Name: Nancy Rodriguez Date: 05/29/2021 MRN: [id number]  DOB: [birth year]  Patient Care Team: Leonel Ramsay, MD as PCP - General (Infectious Diseases) End, [name], MD as PCP - Cardiology (Cardiology)    REASON FOR CONSULTATION: Nancy Rodriguez is a 76 y.o. female with multiple medical problems including monoclonal gammopathy, CKD stage 3b, anemia, RA, diabetes, cirrhosis, and AL amyloidosis.  Patient declined bone marrow transplant and was started on Dara CyBorD and then later rotated to [facility] RD chemotherapy.  Palliative care was consulted to help address goals.  SOCIAL HISTORY:     reports that she has never smoked. She has never used smokeless tobacco. She reports that she does not drink alcohol and does not use drugs.  Patient is widowed.  She currently lives at home with her daughter.  In total, patient has 4 sons and 2 daughters.  Patient previously lived in [place] and was an Mining engineer at [employer].  ADVANCE DIRECTIVES:  Does not have  CODE STATUS:   PAST MEDICAL HISTORY: Past Medical History:  Diagnosis Date   Anemia    Anemia in chronic kidney disease   Chronic kidney disease    Stage 3b chronic kidney disease   Diabetes mellitus without complication (HCC)    Hypercholesterolemia    Hypertension    MGUS (monoclonal gammopathy of unknown significance)    Osteoarthritis    Rheumatoid arthritis ([place])     PAST SURGICAL HISTORY:  Past Surgical History:  Procedure Laterality Date   HERNIA REPAIR     PARATHYROIDECTOMY      HEMATOLOGY/ONCOLOGY HISTORY:  Oncology History   No history exists.    ALLERGIES:  is allergic to benazepril, lisinopril, tolmetin, and nsaids.  MEDICATIONS:  Current Outpatient Medications  Medication Sig Dispense Refill   acyclovir (ZOVIRAX) 400 MG tablet Take 400 mg by mouth daily.     albuterol (VENTOLIN HFA)  108 (90 Base) MCG/ACT inhaler Inhale 2 puffs into the lungs every 4 (four) hours as needed for wheezing or shortness of breath.     amLODipine (NORVASC) 5 MG tablet Take 5 mg by mouth daily.     Continuous Blood Gluc Sensor (FREESTYLE LIBRE 14 DAY SENSOR) MISC SMARTSIG:1 Kit(s) Topical Every 2 Weeks     CVS VITAMIN C 500 MG tablet TAKE 1 TABLET BY MOUTH DAILY 30 tablet 1   dexamethasone (DECADRON) 4 MG tablet TAKE 5 TABLETS BY MOUTH ONCE A WEEK. 60 tablet 0   EPINEPHrine 0.3 mg/0.3 mL IJ SOAJ injection Inject 0.3 mg into the muscle as needed.     ferrous sulfate 325 (65 FE) MG tablet Take 325 mg by mouth daily with breakfast.     gabapentin (NEURONTIN) 100 MG capsule Take 100 mg by mouth at bedtime.     insulin glargine (LANTUS SOLOSTAR) 100 UNIT/ML Solostar Pen Inject 15 Units into the skin at bedtime.     insulin lispro (HUMALOG) 100 UNIT/ML injection Inject into the skin 3 (three) times daily before meals. Sliding scale     lenalidomide (REVLIMID) 10 MG capsule Take 1 capsule (10 mg total) by mouth daily. Take for 21 days, then hold for 7 days. Repeat every 28 days. 21 capsule 0   loperamide (IMODIUM) 2 MG capsule Take 1 capsule (2 mg total) by mouth See admin instructions. Initial: 4 mg, followed by 2 mg after each loose stool, maximum 16 mg within 24 hours.  60 capsule 0   metoprolol succinate (TOPROL-XL) 50 MG 24 hr tablet Take 50 mg by mouth daily.     omeprazole (PRILOSEC) 40 MG capsule Take 40 mg by mouth daily.     potassium chloride SA (KLOR-CON M) 20 MEQ tablet Take 1 tablet (20 mEq total) by mouth 2 (two) times daily. 60 tablet 1   sertraline (ZOLOFT) 25 MG tablet Take 50 mg by mouth daily.     torsemide (DEMADEX) 20 MG tablet Take 20 mg by mouth daily as needed.     No current facility-administered medications for this visit.    VITAL SIGNS: There were no vitals taken for this visit. There were no vitals filed for this visit.  Estimated body mass index is 20.89 kg/m as  calculated from the following:   Height as of 05/18/21: 5' 3.5" (1.613 m).   Weight as of an earlier encounter on 05/29/21: 119 lb 12.8 oz (54.3 kg).  LABS: CBC:    Component Value Date/Time   WBC 5.8 05/29/2021 0827   HGB 9.7 (L) 05/29/2021 0827   HCT 27.7 (L) 05/29/2021 0827   PLT 466 (H) 05/29/2021 0827   MCV 78.0 (L) 05/29/2021 0827   NEUTROABS 3.9 05/29/2021 0827   LYMPHSABS 1.3 05/29/2021 0827   MONOABS 0.4 05/29/2021 0827   EOSABS 0.2 05/29/2021 0827   BASOSABS 0.0 05/29/2021 0827   Comprehensive Metabolic Panel:    Component Value Date/Time   NA 136 05/29/2021 0827   K 3.5 05/29/2021 0827   CL 111 05/29/2021 0827   CO2 18 (L) 05/29/2021 0827   BUN 29 (H) 05/29/2021 0827   CREATININE 1.01 (H) 05/29/2021 0827   GLUCOSE 215 (H) 05/29/2021 0827   CALCIUM 8.2 (L) 05/29/2021 0827   AST 44 (H) 05/29/2021 0827   ALT 54 (H) 05/29/2021 0827   ALKPHOS 797 (H) 05/29/2021 0827   BILITOT 4.3 (H) 05/29/2021 0827   PROT 5.4 (L) 05/29/2021 0827   ALBUMIN 2.5 (L) 05/29/2021 0827    RADIOGRAPHIC STUDIES: No results found.   PERFORMANCE STATUS (ECOG) : 1 - Symptomatic but completely ambulatory  Review of Systems Unless otherwise noted, a complete review of systems is negative.  Physical Exam General: NAD Pulmonary: Unlabored Extremities: no edema, no joint deformities Skin: no rashes Neurological: Weakness but otherwise nonfocal  IMPRESSION: Patient seen in infusion while she is receiving treatment.    She was somewhat drowsy, presumably from premeds.  She says that she is "not doing well."  She describes a recent fall at home in the bathroom and says that she is more reliant on her daughter for assistance with ADLs.  Patient is using a walker to ambulate and was not sure how the fall occurred.  We will plan to refer to [facility] for rehab screening.  Patient would likely benefit from more assistance at home.  She is also pending home palliative care referral.   Patient  denies any symptomatic complaints at present.  I previously sent patient home with a MOST form to discuss with family.  I attempted to call patient's daughter but was unable to reach her.  PLAN: -Continue current scope of treatment -Referrals for rehab screening -MOST Form previously reviewed -RTC 1 month  Patient expressed understanding and was in agreement with this plan. She also understands that She can call the clinic at any time with any questions, concerns, or complaints.     Time Total: 15 minutes  Visit consisted of counseling and education dealing with the  complex and emotionally intense issues of symptom management and palliative care in the setting of serious and potentially life-threatening illness.Greater than 50%  of this time was spent counseling and coordinating care related to the above assessment and plan.  Signed by: Altha Harm, PhD, NP-C

## 2021-05-29 NOTE — Progress Notes (Signed)
Pt c/o itchy eyes and also states she may have hemorrhoids.

## 2021-05-29 NOTE — Progress Notes (Addendum)
Hematology/Oncology  Follow up note [phone] [phone]   Patient Care Team: Nancy Ramsay, MD as PCP - General (Infectious Diseases) End, Nancy Gave, MD as PCP - Cardiology (Cardiology) Nancy Server, MD as Consulting Physician (Oncology)  REFERRING PROVIDER: Leonel Ramsay, MD  CHIEF COMPLAINTS/REASON FOR VISIT:  Follow-up for amyloidosis  HISTORY OF PRESENTING ILLNESS:   Nancy Rodriguez is a  76 y.o.  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  She was seen by Hematology Oncology at [facility] on 02/14/2017.  07/25/2007  SPEP showed M protein of 0.35, IFE showed IgG lamda 09/22/2007  Bone survey negative.  02/15/14 IgG 930, SPEP M protien 0.22, free kappa light chain 3.59, lamda 2.92, ratio 1.23  Hypercalcemia, resolved after stopping HCTZ.  # Transaminitis 03/28/20 US abdomen showed left liver lobe Complex 1.7 cm cyst  # 04/07/20 MRI abdomen w/wo contrast showed 2 benign liver cysts, 2 nonspecific hypovascular irighr liver lesions, abnormal bone marrow signal of L1 vertebral body. Patient was  advised to proceed with bone marrow biopsy and she declined.  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  Patient changed her mind and ask a biopsy to be canceled.  My team and I had multiple phone discussion with patient's daughter Nancy Rodriguez and later with the patient's son Nancy Rodriguez.  Patient agreed with bone marrow biopsy and a biopsy was scheduled on 06/09/2020.  05/20/20 NT proBNP  was normal,  TSH slightly increased, normal T4 Normal factor X Normal coags Troponin 19. Refer to cardiology for evaluation.  May need cardiac MRI  # 06/01/2020 2D echo result was reviewed and discussed with patient.  Patient has normal LVEF 60-65%. Mild asymmetric left ventricular hypertrophy.  Left ventricular diastolic parameters are indeterminate. average left ventricular global longitudinal strain is -16.8 %.  # 06/09/2020, bone marrow biopsy showed monoclonal plasmacytosis, 8%, amyloid deposit present.  Absent iron stores. 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 t (14;20) #06/17/2020, further discussed about diagnosis and treatment plan.  Patient declined bone marrow transplant evaluation.  Decision was made to proceed with Dara-CyborD chemotherapy treatments. #May 2022, second opinion at [facility] with [facility].who agrees with current treatment plan.  He recommends to lower dexamethasone to 20 mg and decrease premed Tylenol to 325 mg  09/01/2020 cardiac MRI was performed at [facility].  Left ventricle is normal in size.  Mild to moderate basal septal hypertrophy.  Hyper dynamic LV function.  LVEF 71%.  Right ventricle is normal in size and wall thickness.  Systolic function normal.  Mild bi atrial enlargement, no significant aortic valve stenosis or regurgitation.  Trivial mitral regurgitation, mild tricuspid regurgitation and a trivial pulmonic regurgitation.  No evidence of MI, scarring or infiltration.  #History of major depression listed in outside problem list.  previous diagnosis and treatment details are not available to me in EMR  I have referred her to psychiatrist  #  vaginal bleeding which stopped- she was seen by GYN and recommended for biopsy.   # She has poor IV access and was not able to receive IV Emend and IV Aloxi.  She does not want to have med port.   # GERD   Dr. Ola Rodriguez has increased her omeprazole to 40 mg daily. She feels symptoms are improved.  # 10/08/2020, cervical spine showed cervical spondylosis.  Spinal canal diagnosis and a posterior disc  osteophyte complex and superimposed small central disc protrusion contact the ventral spinal cord at C4-C5.  Multilevel neuroforaminal narrowing. bilateral atlantooccipital joint effusions, multilevel grade 1 spondylolisthesis. MRI lumbar spine without contrast showed acute or subacute fracture of L4.   Moderate spinal stenosis at L3-4 has progressed. Shallow left foraminal disc protrusion has progressed. Moderate subarticular stenosis on the left also with progression  Moderate spinal stenosis L4-5 with progression. Moderate subarticular stenosis on the left with progression  # 10/25/2020 seen by Nancy Rodriguez who recommend patient to switch Dara CyBorD to Dara RD  # 11/03/2020 Revlimid (lenalidomide) 10 mg by mouth daily for 14 days, then hold for 7 days. Pt was started on a shortened cycle in order to remain on similar schedule with infusion.   # 11/22/2020 seen by hepatologist [facility]. Recommended to start Zoloft 79m daily for pruritis # 11/24/2020 cycle 2 Revlimid 10 mg, she finished 14 days.  Rest of the course was held due to anemia.  Patient declined blood transfusion.  # Vaginal bleeding  patient was seen by Nancy Rodriguez  on megace 465mdaily. 12/22/2020 repeat EMBx - not diagnostic due to insufficient squamous component.   03/03/2021, ultrasound abdomen complete showed possible hypoechoic mass in the pancreatic tail.  1.8 x 1.2 x 1.4 cm.  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  Cholelithiasis.  MRI has been ordered for further evaluation and is scheduled.  INTERVAL HISTORY ShCATHERINA PATESs a [age].o. female who has above history reviewed by me today presents for follow up visit for management of AL amyloidosis Patient was accompanied by her daughter today. Patient reports that she continues to have intermittent vaginal  + Hemorrhoids During the interval, [facility], hyperglycemia in 500s.   Patient is now on Lantus and sliding scale insulin. She also has noticed abdomen skin nodules which she noticed for about a months.  She denies these nodules being related to insulin use.  Review of Systems  Constitutional:  Positive for fatigue. Negative for appetite change, chills, fever and unexpected weight change.  HENT:   Negative for hearing loss, nosebleeds and voice change.   Eyes:  Positive for icterus. Negative for eye problems.  Respiratory:  Negative for chest tightness and cough.   Cardiovascular:  Positive for leg swelling. Negative for chest pain.  Gastrointestinal:  Negative for abdominal distention, abdominal pain, blood in stool and nausea.  Endocrine: Negative for hot flashes.  Genitourinary:  Negative for difficulty urinating and frequency.   Musculoskeletal:  Positive for back pain. Negative for arthralgias.  Skin:  Negative for rash.       jaundice  Neurological:  Positive for numbness. Negative for extremity weakness.  Hematological:  Negative for adenopathy.  Psychiatric/Behavioral:  Negative for confusion.    MEDICAL HISTORY:  Past Medical History:  Diagnosis Date   Anemia    Anemia in chronic kidney disease   Chronic kidney disease    Stage 3b chronic kidney disease   Diabetes mellitus without complication (HCC)    Hypercholesterolemia    Hypertension    MGUS (monoclonal gammopathy of unknown significance)    Osteoarthritis    [place]umatoid [place]  SURGICAL HISTORY: Past Surgical History:  Procedure Laterality Date   HERNIA REPAIR     PARATHYROIDECTOMY      SOCIAL HISTORY: Social History   Socioeconomic History   Marital status: Widowed    Spouse name: Not on file   Number of children: 6   Years of education: Not on file   Highest education level: Not on file  Occupational History   Not on file  Tobacco Use   Smoking status: Never   Smokeless tobacco: Never  Vaping Use   Vaping Use: Never used  Substance and Sexual Activity    Alcohol use: No   Drug use: Never   Sexual activity: Not on file  Other Topics Concern   Not on file  Social History Narrative   Not on file   Social Determinants of Health   Financial Resource Strain: Not on file  Food Insecurity: Not on file  Transportation Needs: Not on file  Physical Activity: Not on file  Stress: Not on file  Social Connections: Not on file  Intimate Partner Violence: Not on file    FAMILY HISTORY: Family History  Problem Relation Age of Onset   Diabetes Daughter    Diabetes Son    Dementia Mother    Cancer Father    Esophageal cancer Sister    Brain cancer Brother     ALLERGIES:  is allergic to benazepril, lisinopril, tolmetin, and nsaids.  MEDICATIONS:  Current Outpatient Medications  Medication Sig Dispense Refill   acyclovir (ZOVIRAX) 400 MG tablet Take 400 mg by mouth daily.     albuterol (VENTOLIN HFA) 108 (90 Base) MCG/ACT inhaler Inhale 2 puffs into the lungs every 4 (four) hours as needed for wheezing or shortness of breath.     amLODipine (NORVASC) 5 MG tablet Take 5 mg by mouth daily.     Continuous Blood Gluc Sensor (FREESTYLE LIBRE 14 DAY SENSOR) MISC SMARTSIG:1 Kit(s) Topical Every 2 Weeks     [employer] VITAMIN C 500 MG tablet TAKE 1 TABLET BY MOUTH DAILY 30 tablet 1   dexamethasone (DECADRON) 4 MG tablet TAKE 5 TABLETS BY MOUTH ONCE A WEEK. 60 tablet 0   EPINEPHrine 0.3 mg/0.3 mL IJ SOAJ injection Inject 0.3 mg into the muscle as needed.     ferrous sulfate 325 (65 FE) MG tablet Take 325 mg by mouth daily with breakfast.     gabapentin (NEURONTIN) 100 MG capsule Take 100 mg by mouth at bedtime.     insulin glargine (LANTUS SOLOSTAR) 100 UNIT/ML Solostar Pen Inject 15 Units into the skin at bedtime.     insulin lispro (HUMALOG) 100 UNIT/ML injection Inject into the skin 3 (three) times daily before meals. Sliding scale     lenalidomide (REVLIMID) 10 MG capsule Take 1 capsule (10 mg total) by mouth daily. Take for 21 days, then hold for 7  days. Repeat every 28 days. 21 capsule 0   loperamide (IMODIUM) 2 MG capsule Take 1 capsule (2 mg total) by mouth See admin instructions. Initial: 4 mg, followed by 2 mg after each loose stool, maximum 16 mg within 24 hours. 60 capsule 0   metoprolol succinate (TOPROL-XL) 50 MG 24 hr tablet Take 50 mg by mouth daily.     omeprazole (PRILOSEC) 40 MG capsule Take 40 mg by mouth daily.     potassium chloride SA (KLOR-CON M) 20 MEQ tablet Take 1 tablet (20 mEq total) by mouth 2 (two) times daily. 60 tablet 1   sertraline (ZOLOFT)  25 MG tablet Take 50 mg by mouth daily.     torsemide (DEMADEX) 20 MG tablet Take 20 mg by mouth daily as needed.     No current facility-administered medications for this visit.     PHYSICAL EXAMINATION: ECOG PERFORMANCE STATUS: 2 - Symptomatic, <50% confined to bed Vitals:   05/29/21 0850  BP: 132/74  Pulse: 82  Resp: 18  Temp: 97.9 F (36.6 C)  SpO2: 100%   Filed Weights   05/29/21 0850  Weight: 119 lb 12.8 oz (54.3 kg)    Physical Exam Constitutional:      General: She is not in acute distress.    Appearance: She is ill-appearing.  HENT:     Head: Normocephalic and atraumatic.  Eyes:     General: No scleral icterus. Cardiovascular:     Rate and Rhythm: Normal rate and regular rhythm.     Heart sounds: Murmur heard.  Pulmonary:     Effort: Pulmonary effort is normal. No respiratory distress.     Breath sounds: No wheezing.  Abdominal:     General: Bowel sounds are normal. There is distension.     Palpations: Abdomen is soft.  Musculoskeletal:        General: No deformity. Normal range of motion.     Cervical back: Normal range of motion and neck supple.     Comments: Bilateral lower extremities +1 edema  Skin:    General: Skin is warm and dry.     Findings: No erythema or rash.     Comments: Several subcentimeter subcutaneous firm nodules on abdomen wall  Neurological:     Mental Status: She is alert. Mental status is at baseline.      Cranial Nerves: No cranial nerve deficit.     Coordination: Coordination normal.  Psychiatric:        Mood and Affect: Mood normal.    LABORATORY DATA:  I have reviewed the data as listed Lab Results  Component Value Date   WBC 5.8 05/29/2021   HGB 9.7 (L) 05/29/2021   HCT 27.7 (L) 05/29/2021   MCV 78.0 (L) 05/29/2021   PLT 466 (H) 05/29/2021   Recent Labs    03/30/21 0909 04/27/21 0809 05/29/21 0827  NA 139 138 136  K 3.8 3.1* 3.5  CL 108 113* 111  CO2 19* 18* 18*  GLUCOSE 260* 90 215*  BUN 26* 33* 29*  CREATININE 0.93 1.07* 1.01*  CALCIUM 8.7* 8.2* 8.2*  GFRNONAA >60 54* 58*  PROT 5.5* 5.7* 5.4*  ALBUMIN 2.2* 2.5* 2.5*  AST 48* 36 44*  ALT 49* 44 54*  ALKPHOS 931* 769* 797*  BILITOT 2.7* 4.3* 4.3*    Iron/TIBC/Ferritin/ %Sat    Component Value Date/Time   IRON 23 (L) 02/16/2021 1042   TIBC 316 02/16/2021 1042   FERRITIN 28 02/16/2021 1042   IRONPCTSAT 7 (L) 02/16/2021 1042     08/25/2019, platelet count 491, WBC 7.5, hemoglobin 12 Creatinine 1.58, EGFR 37, calcium 10.8, albumin 4.2 Negative hepatitis B surface antigen, hepatitis B core antibody, Negative hepatitis C 08/05/2019, A1c 11.2   RADIOGRAPHIC STUDIES: I have personally reviewed the radiological images as listed and agreed with the findings in the report. CT HEAD WO CONTRAST (5MM)  Result Date: 03/03/2021 CLINICAL DATA:  Recent fall, head injury over the left orbit EXAM: CT HEAD WITHOUT CONTRAST TECHNIQUE: Contiguous axial images were obtained from the base of the skull through the vertex without intravenous contrast. COMPARISON:  None. FINDINGS: Brain: Atrophy  and minor white matter microvascular ischemic changes. No acute intracranial hemorrhage, mass lesion, definite infarction, midline shift, herniation, or hydrocephalus. Cisterns are patent. Cerebellar atrophy as well. Vascular: No hyperdense vessel or unexpected calcification. Skull: Normal. Negative for fracture or focal lesion.  Sinuses/Orbits: No acute finding. Other: None. IMPRESSION: Atrophy and white matter microvascular ischemic changes. No acute intracranial abnormality by noncontrast CT. Electronically Signed   By: Jerilynn Mages.  Shick M.D.   On: 03/03/2021 11:53   MR Abdomen W Wo Contrast  Result Date: 04/07/2021 CLINICAL DATA:  Abdominal pain and distention. Possible pancreatic mass on recent ultrasound. EXAM: MRI ABDOMEN WITHOUT AND WITH CONTRAST TECHNIQUE: Multiplanar multisequence MR imaging of the abdomen was performed both before and after the administration of intravenous contrast. CONTRAST:  23m GADAVIST GADOBUTROL 1 MMOL/ML IV SOLN COMPARISON:  Ultrasound on 03/03/2021 and MRI on 04/07/2020 FINDINGS: Image degradation due to motion artifact and ascites noted. Lower chest: No acute findings. Hepatobiliary: Cirrhotic liver morphology again noted. A 13 mm cyst is again seen in the left hepatic lobe, without significant change. No hepatic masses are identified. Several tiny gallstones are again noted, however there is no evidence of cholecystitis or biliary ductal dilatation. Pancreas: A simple bilobed cystic lesion is seen in the pancreatic head measuring 2.3 x 1.6 cm on image 30/3. A simple cystic lesion is also seen in the pancreatic tail measuring 1.8 x 1.3 cm on image 27/3. 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 No evidence of pancreatic ductal dilatation. Spleen:  Within normal limits in size and appearance. Adrenals/Urinary Tract: No masses identified. No evidence of hydronephrosis. Stomach/Bowel: Small hiatal hernia noted. Mild diffuse small bowel wall thickening and mesenteric edema, likely due to hypoalbuminemia. Vascular/Lymphatic: No pathologically enlarged lymph nodes identified. No acute vascular findings. Other:  Mild ascites.  Diffuse mesenteric and body wall edema. Musculoskeletal:  No suspicious bone lesions identified. IMPRESSION: Two simple cystic  lesions in the pancreatic head and tail. 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 Recommend continued follow-up by MRI or CT in 6 months. Hepatic cirrhosis. No evidence of hepatic neoplasm. Mild ascites, diffuse mesenteric and body wall edema, and small hiatal hernia. Diffuse small bowel wall thickening, likely due to hypoalbuminemia. Cholelithiasis. No radiographic evidence of cholecystitis or biliary ductal dilatation. Electronically Signed   By: JMarlaine HindM.D.   On: 04/07/2021 12:17   UKoreaAbdomen Complete  Result Date: 03/03/2021 CLINICAL DATA:  Abdominal distension EXAM: ABDOMEN ULTRASOUND COMPLETE COMPARISON:  None. FINDINGS: Gallbladder: Gallstones. No wall thickening visualized. No sonographic Murphy sign noted by sonographer. Common bile duct: Diameter: 5 mm Liver: No focal lesion identified. Coarse, nodular contour of the liver. Increased parenchymal echogenicity. Portal vein is patent on color Doppler imaging with normal direction of blood flow towards the liver. IVC: No abnormality visualized. Pancreas: Possible hypoechoic mass in the pancreatic tail measuring 1.8 x 1.2 x 1.4 cm. Spleen: Size and appearance within normal limits. Right Kidney: Length: 10.2 cm. Echogenicity within normal limits. No mass or hydronephrosis visualized. Left Kidney: Length: 10.1 cm. Echogenicity within normal limits. No mass or hydronephrosis visualized. Abdominal aorta: No aneurysm visualized. Other findings: Small volume perihepatic ascites. IMPRESSION: 1. Possible hypoechoic mass in the pancreatic tail measuring 1.8 x 1.2 x 1.4 cm. Recommend multiphasic contrast enhanced pancreatic protocol CT or MRI to further evaluate. 2. 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 3. Cholelithiasis. Electronically Signed   By: ACristie Hem  Cherly Beach M.D.   On: 03/03/2021 12:22       ASSESSMENT & PLAN:  1. Light chain (AL) amyloidosis (HCC)   2.  Encounter for antineoplastic chemotherapy   3. 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[place])   4. Hypokalemia   5. Hyperbilirubinemia     # AL-Amyloidosis Liver involvement, possible kidney involvement-on second line treatment with daratumumab Revlimid dexamethasone. Labs are reviewed and discussed with patient. Proceed with daratumumab today Proceed with Revlimid 10 mg 3 weeks on 1 week off. She started this cycle on 05/26/11 Recommend patient to continue dexamethasone weekly. Amyloidosis labs were not able to be obtained during last visit after failed multiple attempts.  Labs reviewed checked today.  Results are pending.   #Iron deficiency anemia due to ongoing vaginal blood loss. Previously received IV iron treatments and blood transfusion for Hemoglobin stable at 9.7, Continue oral iron supplementation 325 mg 3 times daily.   #Elevated LFT, due to amyloidosis liver involvement-hepatic cirrhosis. Alkaline phosphatase 797.  Increase of bilirubin 4.3.   # Uncontrolled DM, weekly Dexamethasone use.  Continue Lantus and sliding scale per endocrinology recommendation.  #Intermittent vaginal bleeding.  Possible Involvedness of amyloidosis vs other etiologies.  I urged patient to further consider gynecology recommendation about D&C procedure.  Patient wants to switch to another gynecologist  #Hypokalemia, potassium 3.5.  Continue potassium chloride 48mq twice daily  #Semiformed soft stools.  Recommend patient to try Imodium as needed.   #Cystic pancreatic cyst, MRI findings were reviewed and discussed with patient. Possible pseudocyst, less likely cystic pancreatic neoplasm.  Given her poor prognosis from amyloidosis, recommend observation.  #Malnutrition, weight loss, hypoalbuminemia Recommend patient continue follow-up with nutritionist.  Encourage nutrition supplements.  #Goals of care, prognosis is poor.  Recommend patient continue follow-up with palliative care  service.  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  Monitor.  We spent sufficient time to discuss many aspect of care, questions were answered to patient's satisfaction.  Lab MD 4 weeks lab MD Daratumumab/ Revlimid/Venofer, also see pallaitve care service. 06/23/2021   cc FLeonel Ramsay MD   ZEarlie Server MD, PhD  05/29/2021

## 2021-05-30 ENCOUNTER — Telehealth: Payer: Self-pay | Admitting: *Deleted

## 2021-05-30 ENCOUNTER — Inpatient Hospital Stay: Payer: Medicare Other

## 2021-05-30 LAB — KAPPA/LAMBDA LIGHT CHAINS
Kappa free light chain: 14 mg/L (ref 3.3–19.4)
Kappa, lambda light chain ratio: 1.39 (ref 0.26–1.65)
Lambda free light chains: 10.1 mg/L (ref 5.7–26.3)

## 2021-05-30 LAB — BETA 2 MICROGLOBULIN, SERUM: Beta-2 Microglobulin: 3.1 mg/L — ABNORMAL HIGH (ref 0.6–2.4)

## 2021-05-30 NOTE — Progress Notes (Signed)
Nutrition Follow-up:  Patient with AL lambda amyloidosis.  Patient on daratumumab, revlimid, dexamethasone weekly.     Called and spoke with [name], daughter and patient via phone.  [name] reports appetite is good.  Patient says appetite is up and down.  Eats cereal and milk or egg, grits and bacon or pancakes for breakfast.  Usually has lunch per daughter and evening meal.  She is receiving meals sent from company that is low in sugar and salt per daughter. Patient says she had some kinda of [place] meal today that she did not like.  "I don't even eat that kinda of food."   Drinking glucerna at least daily between meals, sometimes 2 times per day.  Likes milk and peanut butter.  Patient says that sometimes her daughter says she eats too much.  Daughter in the background said never too much, mom.  Patient says she likes to eat a candy bar sometimes. Patient is receiving PT, OT and home health nursing at this time.    Noted recent hospital admission with elevated blood glucose.  Per notes patient did not take medication prior to MD visit.  Daughter says that she has been monitoring medication and has been giving to patient regularly.      Medications: reviewed  Labs: reviewed  Anthropometrics:   Weight 119 lb 12.8 oz on 2/13 (unsure if swelling same or less than on 1/12 clinic visit)  132 lb (edema both legs) 1/12 139 lb 14.4 oz on 11/17 (edema present) 129 lb 6.4 oz on 9/9 142 lb on 5/25 147 lb on 05/11/20   NUTRITION DIAGNOSIS: Unintentional weight loss continues (edema effecting weight)   INTERVENTION:  Encouraged patient to maximize intake when appetite is good.   Encouraged good sources of protein at every meal Continue glucerna 1-2 times per day.  Patient and daughter are more comfortable with this shake.   Recommend take diabetes medication exactly as prescribed and not miss doses to keep blood glucose in better control.   Contact information given to daughter.    MONITORING,  EVALUATION, GOAL: weight trends, intake   NEXT VISIT: Friday, March 10th after MD visit  Cashtyn Pouliot B. Zenia Resides, [place], [place] Registered Dietitian [phone] (mobile)

## 2021-05-30 NOTE — Telephone Encounter (Signed)
Contacted patient to discuss OT screen apt. Patient accepted an apt for 2/22 at 11 am with [facility]. She thanked me for calling her.

## 2021-06-01 LAB — MULTIPLE MYELOMA PANEL, SERUM
Albumin SerPl Elph-Mcnc: 2.3 g/dL — ABNORMAL LOW (ref 2.9–4.4)
Albumin/Glob SerPl: 1 (ref 0.7–1.7)
Alpha 1: 0.4 g/dL (ref 0.0–0.4)
Alpha2 Glob SerPl Elph-Mcnc: 0.7 g/dL (ref 0.4–1.0)
B-Globulin SerPl Elph-Mcnc: 0.9 g/dL (ref 0.7–1.3)
Gamma Glob SerPl Elph-Mcnc: 0.4 g/dL (ref 0.4–1.8)
Globulin, Total: 2.4 g/dL (ref 2.2–3.9)
IgA: 93 mg/dL (ref 64–422)
IgG (Immunoglobin G), Serum: 451 mg/dL — ABNORMAL LOW (ref 586–1602)
IgM (Immunoglobulin M), Srm: 47 mg/dL (ref 26–217)
M Protein SerPl Elph-Mcnc: 0.1 g/dL — ABNORMAL HIGH
Total Protein ELP: 4.7 g/dL — ABNORMAL LOW (ref 6.0–8.5)

## 2021-06-05 ENCOUNTER — Telehealth: Payer: Self-pay

## 2021-06-05 NOTE — Telephone Encounter (Signed)
Spoke with patient's daughter [name] and scheduled a Mychart Palliative Consult for 06/08/21 @ 1:30 PM.   Consent obtained; updated Outlook/Netsmart/Team List and Epic.

## 2021-06-06 ENCOUNTER — Other Ambulatory Visit: Payer: Self-pay | Admitting: Pharmacist

## 2021-06-06 DIAGNOSIS — E8581 Light chain (AL) amyloidosis: Secondary | ICD-10-CM

## 2021-06-06 MED ORDER — LENALIDOMIDE 10 MG PO CAPS: 10.0000 mg | ORAL_CAPSULE | Freq: Every day | ORAL | 0 refills | Status: DC

## 2021-06-07 ENCOUNTER — Inpatient Hospital Stay: Payer: Medicare Other | Admitting: Occupational Therapy

## 2021-06-07 ENCOUNTER — Other Ambulatory Visit: Payer: Self-pay

## 2021-06-07 DIAGNOSIS — E859 Amyloidosis, unspecified: Secondary | ICD-10-CM

## 2021-06-07 DIAGNOSIS — M6281 Muscle weakness (generalized): Secondary | ICD-10-CM

## 2021-06-07 DIAGNOSIS — R296 Repeated falls: Secondary | ICD-10-CM

## 2021-06-07 NOTE — Therapy (Signed)
[facility] at [place], [place], [place], [address] Phone: [phone]   Fax:  [phone]  Occupational Therapy Screen:  Patient Details  Name: Nancy Rodriguez MRN: [id number] Date of Birth: 1945-10-07 No data recorded  Encounter Date: 06/07/2021   OT End of Session - 06/07/21 1425     Visit Number 0             Past Medical History:  Diagnosis Date   Anemia    Anemia in chronic kidney disease   Chronic kidney disease    Stage 3b chronic kidney disease   Diabetes mellitus without complication ([place])    Hypercholesterolemia    Hypertension    MGUS (monoclonal gammopathy of unknown significance)    Osteoarthritis    Rheumatoid arthritis ([place])     Past Surgical History:  Procedure Laterality Date   HERNIA REPAIR     PARATHYROIDECTOMY      There were no vitals filed for this visit.   Subjective Assessment - 06/07/21 1423     Subjective  At the moment gettting HH OT and PT - did had it before she was in hospital and then her PCP ordered it again after she was discharge -did had 2-3 falls the last 3-6 months-  but feeling HH  is coming to end soon -she feels like she shuffles and not doing as much - daughter helps her with bathing and laying her clothes out - [facility]    Currently in Pain? Yes    Pain Score --   no number   Pain Location Hand    Pain Orientation Left                IMPRESSION JOSH BORDERS NOTE 05/29/21: Patient seen in infusion while she is receiving treatment.     She was somewhat drowsy, presumably from premeds.  She says that she is "not doing well."  She describes a recent fall at home in the bathroom and says that she is more reliant on her daughter for assistance with ADLs.  Patient is using a walker to ambulate and was not sure how the fall occurred.  We will plan to refer to [facility] for rehab screening.  Patient would likely  benefit from more assistance at home.  She is also pending home palliative care referral.    Patient denies any symptomatic complaints at present.   I previously sent patient home with a MOST form to discuss with family.   I attempted to call patient's daughter but was unable to reach her.   PLAN: -Continue current scope of treatment -Referrals for rehab screening -MOST Form previously reviewed -RTC 1 month  OT SCREEN 06/07/21: Pt seen today for OT screen- daughter with her. Reports she drives her mom to appt.  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 Quadcane with her for transfers. Bathroom setup - with walk in shower and chair  And daughter assist in ADL's as needed  BERG balance test done with pt : score 46/56 - low risk for falling - but had hard time weight shifting on one leg - Unable  to do tandem stand, alternate placing foot on stool ,and standing on one foot- had to hold on  Sit<> stand needed pillow in chair Discuss with pt and daughter  to ask [facility] PT and OT for HEP to do at home and break it up in smaller periods during day - 3 x day for 8 min so it can get habit They are interested for referral to [facility] when they are done with [facility] - if [facility] do not refer to outpt PT -then would recommend CARE PROGRAM - she use to like working out at [employer]. Pt to contact me if any questions or need follow up -will ask for referral to [facility] - to line up - for when done with [facility]                                       Visit Diagnosis: Muscle weakness (generalized)  Repeated falls    Problem List Patient Active Problem List   Diagnosis Date Noted   Subdural hematoma 05/18/2021   Hypoalbuminemia 04/27/2021   Vaginal bleeding 04/27/2021    Hepatic cirrhosis ([place]) 04/27/2021   Hyperbilirubinemia 04/27/2021   IDA (iron deficiency anemia) 02/16/2021   Gastroesophageal reflux disease 11/10/2020   Lower extremity edema 10/14/2020   Elevated LFTs 10/12/2020   Loss of weight 10/12/2020   Anasarca 09/04/2020   Edema 08/13/2020   Multiple myeloma ([place]) 08/13/2020   Chemotherapy induced nausea and vomiting 07/27/2020   Hypokalemia 07/27/2020   Goals of care, counseling/discussion 07/20/2020   Microcytic anemia 07/06/2020   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[place]) 07/06/2020   Abnormal EKG 07/01/2020   Essential hypertension 07/01/2020   Hyperlipidemia associated with type 2 diabetes mellitus ([place]) 07/01/2020   Encounter for antineoplastic chemotherapy 06/17/2020   Transaminitis 06/17/2020   Hyperglycemia 06/17/2020   Amyloidosis ([place]) 05/20/2020   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    Nancy Rodriguez, OTR/L,CLT 06/07/2021, 2:26 PM  [facility] at [facility] [address], [place], [place], [address] Phone: [phone]   Fax:  [phone]  Name: Nancy Rodriguez MRN: [id number] Date of Birth: [birth year]

## 2021-06-08 ENCOUNTER — Encounter: Payer: Self-pay | Admitting: Nurse Practitioner

## 2021-06-08 ENCOUNTER — Telehealth: Payer: Medicare Other | Admitting: Nurse Practitioner

## 2021-06-08 DIAGNOSIS — R5382 Chronic fatigue, unspecified: Secondary | ICD-10-CM

## 2021-06-08 DIAGNOSIS — Z515 Encounter for palliative care: Secondary | ICD-10-CM

## 2021-06-08 DIAGNOSIS — R531 Weakness: Secondary | ICD-10-CM

## 2021-06-08 NOTE — Progress Notes (Signed)
[facility] Consult Note Telephone: [phone]  Fax: [phone]   Date of encounter: 06/08/21 11:17 PM PATIENT NAME: Nancy Rodriguez [address] [place] [address]   [phone] (home)  DOB: [birth year] MRN: [id number] PRIMARY CARE PROVIDER:    Leonel Ramsay, MD,  [place] [address] [phone]  REFERRING PROVIDER:   Leonel Ramsay, MD [place],  [place] [address] [phone]  RESPONSIBLE PARTY:    Contact Information     Name Relation Home Work [place], Nancy Rodriguez Daughter   [phone]   M[place], [place]   [phone]      Due to the COVID-19 crisis, this visit was done via telemedicine from my office and it was initiated and consent by this patient and or family.  I connected with  IRETA PULLMAN OR PROXY on 06/08/21 by a video enabled telemedicine application and verified that I am speaking with the correct person using two identifiers.   I discussed the limitations of evaluation and management by telemedicine. The patient expressed understanding and agreed to proceed.  Palliative Care was asked to follow this patient by consultation request of  Leonel Ramsay, MD to address advance care planning and complex medical decision making. This is the initial visit.                           ASSESSMENT AND PLAN / RECOMMENDATIONS:  Symptom Management/Plan: 1. Advance Care Planning;  Ongoing discussions, currently continues chemotherapy  2. Fatigue, weakness secondary to decompensation related to monoclonal gammopathy. Discussed with Nancy Bon, Ms. Pote's daughter to continue with in home therapy, out patient appointments which causes her fatigue. We talked about sleep, sleep hygiene, importance of rest, energy conservation.   3. Goals of Care: Goals include to maximize quality of life and symptom management. Our advance care planning conversation  included a discussion about:    The value and importance of advance care planning  Exploration of personal, cultural or spiritual beliefs that might influence medical decisions  Exploration of goals of care in the event of a sudden injury or illness  Identification and preparation of a healthcare agent  Review and updating or creation of an advance directive document.  4. Palliative care encounter; Palliative care encounter; Palliative medicine team will continue to support patient, patient's family, and medical team. Visit consisted of counseling and education dealing with the complex and emotionally intense issues of symptom management and palliative care in the setting of serious and potentially life-threatening illness  Follow up Palliative Care Visit: Palliative care will continue to follow for complex medical decision making, advance care planning, and clarification of goals. Return 4 weeks with PC RN or prn.  I spent 62 minutes providing this consultation. More than 50% of the time in this consultation was spent in counseling  PPS: 40%  Chief Complaint: Initial palliative consult for complex medical decision making  HISTORY OF PRESENT ILLNESS:  [name] is a 76 y.o. year old female  with multiple medical problems including monoclonal gammopathy, CKD stage 3b, anemia, RA, diabetes, cirrhosis, and AL amyloidosis.  Patient declined bone marrow transplant and was started on Dara CyBorD and then later rotated to [facility] RD chemotherapy. Nancy Bon, Ms. Jackowski's daughter with Ms. Heckart connected for telemedicine video visit. Nancy Rodriguez and I talked about the last time Ms. Rivenbark was independent, living independently. Nancy Rodriguez endorses with pandemic Nancy Rodriguez  came from [place] to help Ms. Drewry and since stayed. We talked about Ms. Keys past medical history, dx of monoclonal gammopathy, AL amyloidosis. We talked about symptoms, ros, appetite which is declined. We talked about nutrition. We talked about  in home therapy. Nancy Rodriguez endorses Ms. Lafortune has been participating in therapy, slowly getting stronger. We talked about provider visits which also wears Ms. Brickman out. We talked about energy conservation. We talked about importance of rest, sleep patterns. We talked about medical goals including wishes for continued treatment with hope to return to previous ability to ambulate, go on outing, drive. Discussed realistic expectations, will see how she does with therapy. She has a last and pending appointment with Dr Tasia Catch[facility] Oncology. We talked about pc in [place]. We talked about f/u pc visit in about 4 weeks to see how she is doing with therapy, symptoms. Nancy Rodriguez in agreement, will have PC RN schedule visit. Therapeutic listening, emotional support provided, Questions answered.   Patient is widowed.  She currently lives at home with her daughter.  In total, patient has 4 sons and 2 daughters.  Patient previously lived in [place] and was an Mining engineer at [employer].  History obtained from review of EMR, discussion with Orlan Leavens, daughter, with Ms. Mawson.  I reviewed available labs, medications, imaging, studies and related documents from the EMR.  Records reviewed and summarized above.   ROS 10 point system reviewed with daughter Nancy Rodriguez with Ms. Sandora all negative except HPI  Physical Exam: deferred CURRENT PROBLEM LIST:  Patient Active Problem List   Diagnosis Date Noted   Subdural hematoma 05/18/2021   Hypoalbuminemia 04/27/2021   Vaginal bleeding 04/27/2021   Hepatic cirrhosis ([place]) 04/27/2021   Hyperbilirubinemia 04/27/2021   IDA (iron deficiency anemia) 02/16/2021   Gastroesophageal reflux disease 11/10/2020   Lower extremity edema 10/14/2020   Elevated LFTs 10/12/2020   Loss of weight 10/12/2020   Anasarca 09/04/2020   Edema 08/13/2020   Multiple myeloma ([place]) 08/13/2020   Chemotherapy induced nausea and vomiting 07/27/2020   Hypokalemia 07/27/2020   Goals of care,  counseling/discussion 07/20/2020   Microcytic anemia 07/06/2020   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[place]) 07/06/2020   Abnormal EKG 07/01/2020   Essential hypertension 07/01/2020   Hyperlipidemia associated with type 2 diabetes mellitus ([place]) 07/01/2020   Encounter for antineoplastic chemotherapy 06/17/2020   Transaminitis 06/17/2020   Hyperglycemia 06/17/2020   Amyloidosis ([place]) 05/20/2020   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   PAST MEDICAL HISTORY:  Active Ambulatory Problems    Diagnosis Date Noted   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/2020   Encounter for antineoplastic chemotherapy 06/17/2020   Transaminitis 06/17/2020   Hyperglycemia 06/17/2020   Abnormal EKG 07/01/2020   Essential hypertension 07/01/2020   Hyperlipidemia associated with type 2 diabetes mellitus ([place]) 07/01/2020   Microcytic anemia 07/06/2020   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[place]) 07/06/2020   Goals of care, counseling/discussion 07/20/2020   Chemotherapy induced nausea and vomiting 07/27/2020   Hypokalemia 07/27/2020   Edema 08/13/2020   Multiple myeloma ([place]) 08/13/2020   Anasarca 09/04/2020   Elevated LFTs 10/12/2020   Loss of weight 10/12/2020   Lower extremity edema 10/14/2020   Gastroesophageal reflux disease 11/10/2020   IDA (iron deficiency anemia) 02/16/2021   Hypoalbuminemia 04/27/2021   Vaginal bleeding 04/27/2021   Hepatic cirrhosis ([place]) 04/27/2021   Hyperbilirubinemia 04/27/2021   Subdural hematoma 05/18/2021   Resolved Ambulatory Problems  Diagnosis Date Noted   No Resolved Ambulatory Problems   Past Medical History:  Diagnosis Date   Anemia    Chronic kidney disease    Diabetes mellitus without complication (HCC)    Hypercholesterolemia    Hypertension    Osteoarthritis    Rheumatoid arthritis ([place])    SOCIAL HX:  Social History   Tobacco Use   Smoking status: Never    Smokeless tobacco: Never  Substance Use Topics   Alcohol use: No   FAMILY HX:  Family History  Problem Relation Age of Onset   Diabetes Daughter    Diabetes Son    Dementia Mother    Cancer Father    Esophageal cancer Sister    Brain cancer Brother       ALLERGIES:  Allergies  Allergen Reactions   Benazepril Anaphylaxis and Swelling    TONGUE AND LIPS     Lisinopril Swelling   Tolmetin Swelling   Nsaids Swelling     PERTINENT MEDICATIONS:  Outpatient Encounter Medications as of 06/08/2021  Medication Sig   acyclovir (ZOVIRAX) 400 MG tablet Take 400 mg by mouth daily.   albuterol (VENTOLIN HFA) 108 (90 Base) MCG/ACT inhaler Inhale 2 puffs into the lungs every 4 (four) hours as needed for wheezing or shortness of breath.   amLODipine (NORVASC) 5 MG tablet Take 5 mg by mouth daily.   Continuous Blood Gluc Sensor (FREESTYLE LIBRE 14 DAY SENSOR) MISC SMARTSIG:1 Kit(s) Topical Every 2 Weeks   [employer] VITAMIN C 500 MG tablet TAKE 1 TABLET BY MOUTH DAILY   dexamethasone (DECADRON) 4 MG tablet TAKE 5 TABLETS BY MOUTH ONCE A WEEK.   EPINEPHrine 0.3 mg/0.3 mL IJ SOAJ injection Inject 0.3 mg into the muscle as needed.   ferrous sulfate 325 (65 FE) MG tablet Take 325 mg by mouth daily with breakfast.   gabapentin (NEURONTIN) 100 MG capsule Take 100 mg by mouth at bedtime.   insulin glargine (LANTUS SOLOSTAR) 100 UNIT/ML Solostar Pen Inject 15 Units into the skin at bedtime.   insulin lispro (HUMALOG) 100 UNIT/ML injection Inject into the skin 3 (three) times daily before meals. Sliding scale   lenalidomide (REVLIMID) 10 MG capsule Take 1 capsule (10 mg total) by mouth daily. Take for 7 days, then hold for 7 days. (Short fill to complete cycle)   loperamide (IMODIUM) 2 MG capsule Take 1 capsule (2 mg total) by mouth See admin instructions. Initial: 4 mg, followed by 2 mg after each loose stool, maximum 16 mg within 24 hours.   metoprolol succinate (TOPROL-XL) 50 MG 24 hr tablet Take 50 mg  by mouth daily.   omeprazole (PRILOSEC) 40 MG capsule Take 40 mg by mouth daily.   potassium chloride SA (KLOR-CON M) 20 MEQ tablet Take 1 tablet (20 mEq total) by mouth 2 (two) times daily.   sertraline (ZOLOFT) 25 MG tablet Take 50 mg by mouth daily.   torsemide (DEMADEX) 20 MG tablet Take 20 mg by mouth daily as needed.   No facility-administered encounter medications on file as of 06/08/2021.   Thank you for the opportunity to participate in the care of Ms. Tacker.  The palliative care team will continue to follow. Please call our office at [phone] if we can be of additional assistance.   Cathlyn Tersigni Z Shaliah Wann, NP ,

## 2021-06-09 ENCOUNTER — Other Ambulatory Visit: Payer: Self-pay

## 2021-06-09 DIAGNOSIS — R296 Repeated falls: Secondary | ICD-10-CM

## 2021-06-09 DIAGNOSIS — E859 Amyloidosis, unspecified: Secondary | ICD-10-CM

## 2021-06-09 DIAGNOSIS — M6281 Muscle weakness (generalized): Secondary | ICD-10-CM

## 2021-06-14 ENCOUNTER — Other Ambulatory Visit: Payer: Self-pay | Admitting: *Deleted

## 2021-06-14 DIAGNOSIS — E8581 Light chain (AL) amyloidosis: Secondary | ICD-10-CM

## 2021-06-14 MED ORDER — LENALIDOMIDE 10 MG PO CAPS: ORAL_CAPSULE | ORAL | 0 refills | Status: DC

## 2021-06-15 ENCOUNTER — Telehealth: Payer: Self-pay | Admitting: *Deleted

## 2021-06-15 NOTE — Telephone Encounter (Signed)
Reached out to [facility] and clarified prescription with Christy. Pt will need qty of 21 capsules, not 7.  ?

## 2021-06-15 NOTE — Telephone Encounter (Signed)
[facility] called questioning the quantity of the Revlimid prescription sent which does not match the directions and requests a new prescription or call be done ?

## 2021-06-21 ENCOUNTER — Telehealth: Payer: Self-pay | Admitting: *Deleted

## 2021-06-21 NOTE — Telephone Encounter (Signed)
Pt cancelled echo from 05/24/21 d/t "pt sick".  ?Pt to follow up with echo results.  ?Pt's f/u was r/s to 06/29/21.  ?Per Dr. Darnelle Bos note pt to follow up after completion of echo.  ?Will forward to scheduling to contact pt to reschedule echo and follow up afterward.  ?

## 2021-06-21 NOTE — Telephone Encounter (Signed)
-----   Message from Janan Ridge, [place] sent at 06/21/2021  9:10 AM EST ----- ?Regarding: ECHO cancelled ?Patient is coming to see [place] 06/29/2021 for a 4-6 week follow up.  ?Dr. Saunders Revel ordered an ECHO @ last OV.  ?Patient cancelled that. ?Would you like to try to reschedule it? ?  ? ? ?

## 2021-06-23 ENCOUNTER — Inpatient Hospital Stay: Payer: Medicare Other | Attending: Oncology

## 2021-06-23 ENCOUNTER — Encounter: Payer: Self-pay | Admitting: Oncology

## 2021-06-23 ENCOUNTER — Other Ambulatory Visit: Payer: Self-pay

## 2021-06-23 ENCOUNTER — Inpatient Hospital Stay: Payer: Medicare Other

## 2021-06-23 ENCOUNTER — Ambulatory Visit: Payer: Medicare Other | Admitting: Internal Medicine

## 2021-06-23 ENCOUNTER — Inpatient Hospital Stay (HOSPITAL_BASED_OUTPATIENT_CLINIC_OR_DEPARTMENT_OTHER): Payer: Medicare Other | Admitting: Oncology

## 2021-06-23 VITALS — BP 138/64 | HR 86 | Temp 96.9°F | Resp 18 | Wt 124.0 lb

## 2021-06-23 DIAGNOSIS — D509 Iron deficiency anemia, unspecified: Secondary | ICD-10-CM | POA: Diagnosis not present

## 2021-06-23 DIAGNOSIS — N1831 Chronic kidney disease, stage 3a: Secondary | ICD-10-CM

## 2021-06-23 DIAGNOSIS — E8809 Other disorders of plasma-protein metabolism, not elsewhere classified: Secondary | ICD-10-CM | POA: Insufficient documentation

## 2021-06-23 DIAGNOSIS — R7989 Other specified abnormal findings of blood chemistry: Secondary | ICD-10-CM

## 2021-06-23 DIAGNOSIS — E859 Amyloidosis, unspecified: Secondary | ICD-10-CM | POA: Diagnosis present

## 2021-06-23 DIAGNOSIS — E876 Hypokalemia: Secondary | ICD-10-CM | POA: Diagnosis not present

## 2021-06-23 DIAGNOSIS — R634 Abnormal weight loss: Secondary | ICD-10-CM

## 2021-06-23 DIAGNOSIS — Z79899 Other long term (current) drug therapy: Secondary | ICD-10-CM | POA: Insufficient documentation

## 2021-06-23 DIAGNOSIS — Z5111 Encounter for antineoplastic chemotherapy: Secondary | ICD-10-CM | POA: Diagnosis not present

## 2021-06-23 DIAGNOSIS — E8581 Light chain (AL) amyloidosis: Secondary | ICD-10-CM

## 2021-06-23 DIAGNOSIS — R748 Abnormal levels of other serum enzymes: Secondary | ICD-10-CM | POA: Diagnosis not present

## 2021-06-23 DIAGNOSIS — D500 Iron deficiency anemia secondary to blood loss (chronic): Secondary | ICD-10-CM | POA: Diagnosis not present

## 2021-06-23 DIAGNOSIS — E119 Type 2 diabetes mellitus without complications: Secondary | ICD-10-CM | POA: Diagnosis not present

## 2021-06-23 DIAGNOSIS — K862 Cyst of pancreas: Secondary | ICD-10-CM | POA: Insufficient documentation

## 2021-06-23 LAB — CBC WITH DIFFERENTIAL/PLATELET
Abs Immature Granulocytes: 0.06 10*3/uL (ref 0.00–0.07)
Basophils Absolute: 0.1 10*3/uL (ref 0.0–0.1)
Basophils Relative: 1 %
Eosinophils Absolute: 0 10*3/uL (ref 0.0–0.5)
Eosinophils Relative: 0 %
HCT: 28.8 % — ABNORMAL LOW (ref 36.0–46.0)
Hemoglobin: 10.1 g/dL — ABNORMAL LOW (ref 12.0–15.0)
Immature Granulocytes: 1 %
Lymphocytes Relative: 11 %
Lymphs Abs: 0.5 10*3/uL — ABNORMAL LOW (ref 0.7–4.0)
MCH: 27.4 pg (ref 26.0–34.0)
MCHC: 35.1 g/dL (ref 30.0–36.0)
MCV: 78.3 fL — ABNORMAL LOW (ref 80.0–100.0)
Monocytes Absolute: 0.1 10*3/uL (ref 0.1–1.0)
Monocytes Relative: 1 %
Neutro Abs: 3.6 10*3/uL (ref 1.7–7.7)
Neutrophils Relative %: 86 %
Platelets: 519 10*3/uL — ABNORMAL HIGH (ref 150–400)
RBC: 3.68 MIL/uL — ABNORMAL LOW (ref 3.87–5.11)
RDW: 23.9 % — ABNORMAL HIGH (ref 11.5–15.5)
WBC: 4.3 10*3/uL (ref 4.0–10.5)
nRBC: 0 % (ref 0.0–0.2)

## 2021-06-23 LAB — COMPREHENSIVE METABOLIC PANEL
ALT: 46 U/L — ABNORMAL HIGH (ref 0–44)
AST: 42 U/L — ABNORMAL HIGH (ref 15–41)
Albumin: 2.5 g/dL — ABNORMAL LOW (ref 3.5–5.0)
Alkaline Phosphatase: 938 U/L — ABNORMAL HIGH (ref 38–126)
Anion gap: 8 (ref 5–15)
BUN: 33 mg/dL — ABNORMAL HIGH (ref 8–23)
CO2: 15 mmol/L — ABNORMAL LOW (ref 22–32)
Calcium: 8.7 mg/dL — ABNORMAL LOW (ref 8.9–10.3)
Chloride: 111 mmol/L (ref 98–111)
Creatinine, Ser: 1.04 mg/dL — ABNORMAL HIGH (ref 0.44–1.00)
GFR, Estimated: 55 mL/min — ABNORMAL LOW (ref >60)
Glucose, Bld: 404 mg/dL — ABNORMAL HIGH (ref 70–99)
Potassium: 4.1 mmol/L (ref 3.5–5.1)
Sodium: 134 mmol/L — ABNORMAL LOW (ref 135–145)
Total Bilirubin: 5.2 mg/dL — ABNORMAL HIGH (ref 0.3–1.2)
Total Protein: 5.7 g/dL — ABNORMAL LOW (ref 6.5–8.1)

## 2021-06-23 MED ORDER — ACYCLOVIR 400 MG PO TABS: 400.0000 mg | ORAL_TABLET | Freq: Two times a day (BID) | ORAL | 2 refills | Status: DC

## 2021-06-23 NOTE — Progress Notes (Signed)
Nutrition Follow-up: ? ? ?Patient with AL lambda amylodosis.  Patient on darazalex, revlimid ? ?Met with patient and daughter [name] following MD appointment.  Patient reports that her appetite is good and she is eating.  Usually has 3 meals per day.  Likes eggs, turkey bacon/sausage or oatmeal for breakfast.  Lunch is frozen meals that son has ordered (healthy low sodium meals).  Last night for dinner ate roast beef with mashed potatoes.  Drinking glucerna shake BID.  Patient is more active around the house and planning to start the CARE program to improve strength and balance.   ? ? ? ?Medications: lantus, lispro,  ? ?Labs: glucose 404, BUN 33, creatinine 1.04 ? ?Anthropometrics:  ? ?Weight 124 lb today (no edema noted today) ? ?119 lb  on 2/14 ?132 lb (edema both legs) 1/12 ?139 lb on 11/17 (edema present) ?129 lb on 9/9 ?142 lb on 5/25 ?147 lb on 05/11/20 ? ? ?NUTRITION DIAGNOSIS: Unintentional weight loss continues (edema effecting weight) ? ? ? ?INTERVENTION:  ?Continue glucerna shake at least BID.  Daughter wanted names of other low sugar shakes.  Provided list. ?Encouraged patient to continue eating well including good sources of protein.   ?Daughter wanting more information on carbohydrates.  Provided Carbohydrate Counting for Diabetes handout from [facility].   ?Recommend taking diabetes medication as prescribed and not missing medication.  ?  ? ?MONITORING, EVALUATION, GOAL: weight trends, intake ? ? ?NEXT VISIT: Tuesday, April 11th phone visit ? ? B. , RD, LDN ?Registered Dietitian ?[phone] (mobile) ? ? ?

## 2021-06-23 NOTE — Progress Notes (Addendum)
Hematology/Oncology Progress note [phone] [phone]      Patient Care Team: Nancy Ramsay, MD as PCP - General (Infectious Diseases) End, Nancy Gave, MD as PCP - Cardiology (Cardiology) Nancy Server, MD as Consulting Physician (Oncology)  REFERRING PROVIDER: Leonel Ramsay, MD  CHIEF COMPLAINTS/REASON FOR VISIT:  Follow-up for amyloidosis  HISTORY OF PRESENTING ILLNESS:   Nancy Rodriguez is a  76 y.o.  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  She was seen by Hematology Oncology at [facility] on 02/14/2017.  07/25/2007  SPEP showed M protein of 0.35, IFE showed IgG lamda 09/22/2007  Bone survey negative.  02/15/14 IgG 930, SPEP M protien 0.22, free kappa light chain 3.59, lamda 2.92, ratio 1.23  Hypercalcemia, resolved after stopping HCTZ.  # Transaminitis 03/28/20 US abdomen showed left liver lobe Complex 1.7 cm cyst  # 04/07/20 MRI abdomen w/wo contrast showed 2 benign liver cysts, 2 nonspecific hypovascular irighr liver lesions, abnormal bone marrow signal of L1 vertebral body. Patient was  advised to proceed with bone marrow biopsy and she declined.  # referred her to established care with GI and was seen on 04/19/20,  # Liver biopsy showed amyloidosis. [place] MS/MS showed AL type # 05/20/20 recommend bone marrow biopsy which is scheduled on 05/24/2020.  Patient changed her mind and ask a biopsy to be canceled.  My team and I had multiple phone discussion with patient's daughter Nancy Rodriguez and later with the patient's son Nancy Rodriguez.  Patient agreed with bone marrow biopsy and a biopsy was scheduled on 06/09/2020.  05/20/20 NT proBNP  was normal,  TSH slightly increased, normal T4 Normal factor X Normal coags Troponin 19. Refer to cardiology for  evaluation. May need cardiac MRI  # 06/01/2020 2D echo result was reviewed and discussed with patient.  Patient has normal LVEF 60-65%. Mild asymmetric left ventricular hypertrophy.  Left ventricular diastolic parameters are indeterminate. average left ventricular global longitudinal strain is -16.8 %.  # 06/09/2020, bone marrow biopsy showed monoclonal plasmacytosis, 8%, amyloid deposit present.  Absent iron stores. 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 t (14;20) #06/17/2020, further discussed about diagnosis and treatment plan.  Patient declined bone marrow transplant evaluation.  Decision was made to proceed with Dara-CyborD chemotherapy treatments. #May 2022, second opinion at [place] with Nancy Rodriguez.who agrees with current treatment plan.  He recommends to lower dexamethasone to 20 mg and decrease premed Tylenol to 325 mg  09/01/2020 cardiac MRI was performed at [facility].  Left ventricle is normal in size.  Mild to moderate basal septal hypertrophy.  Hyper dynamic LV function.  LVEF 71%.  Right ventricle is normal in size and wall thickness.  Systolic function normal.  Mild bi atrial enlargement, no significant aortic valve stenosis or regurgitation.  Trivial mitral regurgitation, mild tricuspid regurgitation and a trivial pulmonic regurgitation.  No evidence of MI, scarring or infiltration.  #History of major depression listed in outside problem list.  previous diagnosis and treatment details are not available to me in EMR  I have referred her to psychiatrist  #  vaginal bleeding which stopped- she was seen by GYN and recommended for biopsy.   # She has poor IV access and was not able to receive IV Emend and IV Aloxi.  She does not want to have med port.   # GERD   Nancy Rodriguez has increased her omeprazole to 40 mg daily. She feels symptoms are improved.  # 10/08/2020, cervical spine showed cervical spondylosis.  Spinal canal diagnosis and a posterior  disc osteophyte complex and superimposed small central disc protrusion contact the ventral spinal cord at C4-C5.  Multilevel neuroforaminal narrowing. bilateral atlantooccipital joint effusions, multilevel grade 1 spondylolisthesis. MRI lumbar spine without contrast showed acute or subacute fracture of L4.   Moderate spinal stenosis at L3-4 has progressed. Shallow left foraminal disc protrusion has progressed. Moderate subarticular stenosis on the left also with progression  Moderate spinal stenosis L4-5 with progression. Moderate subarticular stenosis on the left with progression  # 10/25/2020 seen by [place] who recommend patient to switch Dara CyBorD to Dara RD  # 11/03/2020 Revlimid (lenalidomide) 10 mg by mouth daily for 14 days, then hold for 7 days. Pt was started on a shortened cycle in order to remain on similar schedule with infusion.   # 11/22/2020 seen by hepatologist Nancy Rodriguez. Recommended to start Zoloft $RemoveBefo'50mg'pilnKnJziRn$  daily for pruritis # 11/24/2020 cycle 2 Revlimid 10 mg, she finished 14 days.  Rest of the course was held due to anemia.  Patient declined blood transfusion.  # Vaginal bleeding  patient was seen by Nancy Rodriguez,  on megace $RemoveB'40mg'NInOeEZD$  daily. 12/22/2020 repeat EMBx - not diagnostic due to insufficient squamous component.   03/03/2021, ultrasound abdomen complete showed possible hypoechoic mass in the pancreatic tail.  1.8 x 1.2 x 1.4 cm.  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  Cholelithiasis.  MRI has been ordered for further evaluation and is scheduled.  INTERVAL HISTORY Nancy Rodriguez is a 76 y.o. female who has above history reviewed by me today presents for follow up visit for management of AL amyloidosis Patient was accompanied by her daughter today. Vaginal bleeding has stopped. For uncontrolled diabetes, patient follows up with Dr. Gabriel Rodriguez. [place] and presented to [facility].  Patient was found to have marked hyperglycemia, AKI,  UTI. 05/03/2021, CT head showed chronic appearing subdural hematoma, measuring up to 1.6 on the left and 0.9 on the right. 05/18/2021, patient was seen by cardiology [name], patient was recommended to continue torsemide 20 mg daily as needed for edema.  Patient was recommended to discontinue aspirin 81 mg due to frequent falls and chronic subdural hematoma.  Patient has not had further falls.  She has home physical therapy starting recently. Appetite is fair.  Lower extremity edema has improved.   Review of Systems  Constitutional:  Positive for fatigue. Negative for appetite change, chills, fever and unexpected weight change.  HENT:   Negative for hearing loss, nosebleeds and voice change.   Eyes:  Positive for icterus. Negative for eye problems.  Respiratory:  Negative for chest tightness and cough.   Cardiovascular:  Positive for leg swelling. Negative for chest pain.  Gastrointestinal:  Negative for abdominal distention, abdominal pain, blood in stool and nausea.  Endocrine: Negative for hot flashes.  Genitourinary:  Negative for difficulty urinating and frequency.   Musculoskeletal:  Positive for back pain. Negative for arthralgias.  Skin:  Negative for rash.       jaundice  Neurological:  Positive for numbness. Negative for extremity weakness.  Hematological:  Negative for adenopathy.  Psychiatric/Behavioral:  Negative for confusion.    MEDICAL HISTORY:  Past Medical History:  Diagnosis Date   Anemia    Anemia in chronic  kidney disease   Chronic kidney disease    Stage 3b chronic kidney disease   Diabetes mellitus without complication (HCC)    Hypercholesterolemia    Hypertension    MGUS (monoclonal gammopathy of unknown significance)    Osteoarthritis    Rheumatoid arthritis ([place])     SURGICAL HISTORY: Past Surgical History:  Procedure Laterality Date   HERNIA REPAIR     PARATHYROIDECTOMY      SOCIAL HISTORY: Social History   Socioeconomic History   Marital status:  Widowed    Spouse name: Not on file   Number of children: 6   Years of education: Not on file   Highest education level: Not on file  Occupational History   Not on file  Tobacco Use   Smoking status: Never   Smokeless tobacco: Never  Vaping Use   Vaping Use: Never used  Substance and Sexual Activity   Alcohol use: No   Drug use: Never   Sexual activity: Not on file  Other Topics Concern   Not on file  Social History Narrative   Not on file   Social Determinants of Health   Financial Resource Strain: Not on file  Food Insecurity: Not on file  Transportation Needs: Not on file  Physical Activity: Not on file  Stress: Not on file  Social Connections: Not on file  Intimate Partner Violence: Not on file    FAMILY HISTORY: Family History  Problem Relation Age of Onset   Diabetes Daughter    Diabetes Son    Dementia Mother    Cancer Father    Esophageal cancer Sister    Brain cancer Brother     ALLERGIES:  is allergic to benazepril, lisinopril, tolmetin, and nsaids.  MEDICATIONS:  Current Outpatient Medications  Medication Sig Dispense Refill   albuterol (VENTOLIN HFA) 108 (90 Base) MCG/ACT inhaler Inhale 2 puffs into the lungs every 4 (four) hours as needed for wheezing or shortness of breath.     amLODipine (NORVASC) 5 MG tablet Take 5 mg by mouth daily.     Continuous Blood Gluc Sensor (FREESTYLE LIBRE 14 DAY SENSOR) MISC SMARTSIG:1 Kit(s) Topical Every 2 Weeks     [employer] VITAMIN C 500 MG tablet TAKE 1 TABLET BY MOUTH DAILY 30 tablet 1   dexamethasone (DECADRON) 4 MG tablet TAKE 5 TABLETS BY MOUTH ONCE A WEEK. 60 tablet 0   gabapentin (NEURONTIN) 100 MG capsule Take 100 mg by mouth at bedtime.     insulin glargine (LANTUS SOLOSTAR) 100 UNIT/ML Solostar Pen Inject 15 Units into the skin at bedtime.     insulin lispro (HUMALOG) 100 UNIT/ML injection Inject into the skin 3 (three) times daily before meals. Sliding scale     lenalidomide (REVLIMID) 10 MG capsule Take 1  capsule (10 mg total) by mouth daily. Take for 21 days, then hold for 7 days. Repeat every 28 days. 7 capsule 0   metoprolol succinate (TOPROL-XL) 50 MG 24 hr tablet Take 50 mg by mouth daily.     omeprazole (PRILOSEC) 40 MG capsule Take 40 mg by mouth daily.     potassium chloride SA (KLOR-CON M) 20 MEQ tablet Take 1 tablet (20 mEq total) by mouth 2 (two) times daily. 60 tablet 1   sertraline (ZOLOFT) 25 MG tablet Take 50 mg by mouth daily.     torsemide (DEMADEX) 20 MG tablet Take 20 mg by mouth daily as needed.     acyclovir (ZOVIRAX) 400 MG tablet Take 1 tablet (  400 mg total) by mouth 2 (two) times daily. 60 tablet 2   EPINEPHrine 0.3 mg/0.3 mL IJ SOAJ injection Inject 0.3 mg into the muscle as needed. (Patient not taking: Reported on 06/23/2021)     loperamide (IMODIUM) 2 MG capsule Take 1 capsule (2 mg total) by mouth See admin instructions. Initial: 4 mg, followed by 2 mg after each loose stool, maximum 16 mg within 24 hours. (Patient not taking: Reported on 06/23/2021) 60 capsule 0   No current facility-administered medications for this visit.     PHYSICAL EXAMINATION: ECOG PERFORMANCE STATUS: 2 - Symptomatic, <50% confined to bed Vitals:   06/23/21 1022  BP: 138/64  Pulse: 86  Resp: 18  Temp: (!) 96.9 F (36.1 C)   Filed Weights   06/23/21 1022  Weight: 124 lb (56.2 kg)    Physical Exam Constitutional:      General: She is not in acute distress.    Appearance: She is ill-appearing.  HENT:     Head: Normocephalic and atraumatic.  Eyes:     General: No scleral icterus. Cardiovascular:     Rate and Rhythm: Normal rate and regular rhythm.     Heart sounds: Murmur heard.  Pulmonary:     Effort: Pulmonary effort is normal. No respiratory distress.     Breath sounds: No wheezing.  Abdominal:     General: Bowel sounds are normal. There is distension.     Palpations: Abdomen is soft.  Musculoskeletal:        General: No deformity. Normal range of motion.     Cervical  back: Normal range of motion and neck supple.     Comments: Bilateral lower extremities trace edema  Skin:    General: Skin is warm and dry.     Findings: No erythema or rash.     Comments: Several subcentimeter subcutaneous firm nodules on abdomen wall  Neurological:     Mental Status: She is alert. Mental status is at baseline.     Cranial Nerves: No cranial nerve deficit.     Coordination: Coordination normal.  Psychiatric:        Mood and Affect: Mood normal.    LABORATORY DATA:  I have reviewed the data as listed Lab Results  Component Value Date   WBC 4.3 06/23/2021   HGB 10.1 (L) 06/23/2021   HCT 28.8 (L) 06/23/2021   MCV 78.3 (L) 06/23/2021   PLT 519 (H) 06/23/2021   Recent Labs    04/27/21 0809 05/29/21 0827 06/23/21 0953  NA 138 136 134*  K 3.1* 3.5 4.1  CL 113* 111 111  CO2 18* 18* 15*  GLUCOSE 90 215* 404*  BUN 33* 29* 33*  CREATININE 1.07* 1.01* 1.04*  CALCIUM 8.2* 8.2* 8.7*  GFRNONAA 54* 58* 55*  PROT 5.7* 5.4* 5.7*  ALBUMIN 2.5* 2.5* 2.5*  AST 36 44* 42*  ALT 44 54* 46*  ALKPHOS 769* 797* 938*  BILITOT 4.3* 4.3* 5.2*    Iron/TIBC/Ferritin/ %Sat    Component Value Date/Time   IRON 23 (L) 02/16/2021 1042   TIBC 316 02/16/2021 1042   FERRITIN 28 02/16/2021 1042   IRONPCTSAT 7 (L) 02/16/2021 1042     08/25/2019, platelet count 491, WBC 7.5, hemoglobin 12 Creatinine 1.58, EGFR 37, calcium 10.8, albumin 4.2 Negative hepatitis B surface antigen, hepatitis B core antibody, Negative hepatitis C 08/05/2019, A1c 11.2   RADIOGRAPHIC STUDIES: I have personally reviewed the radiological images as listed and agreed with the findings in the report.  MR Abdomen W Wo Contrast  Result Date: 04/07/2021 CLINICAL DATA:  Abdominal pain and distention. Possible pancreatic mass on recent ultrasound. EXAM: MRI ABDOMEN WITHOUT AND WITH CONTRAST TECHNIQUE: Multiplanar multisequence MR imaging of the abdomen was performed both before and after the administration of  intravenous contrast. CONTRAST:  65m GADAVIST GADOBUTROL 1 MMOL/ML IV SOLN COMPARISON:  Ultrasound on 03/03/2021 and MRI on 04/07/2020 FINDINGS: Image degradation due to motion artifact and ascites noted. Lower chest: No acute findings. Hepatobiliary: Cirrhotic liver morphology again noted. A 13 mm cyst is again seen in the left hepatic lobe, without significant change. No hepatic masses are identified. Several tiny gallstones are again noted, however there is no evidence of cholecystitis or biliary ductal dilatation. Pancreas: A simple bilobed cystic lesion is seen in the pancreatic head measuring 2.3 x 1.6 cm on image 30/3. A simple cystic lesion is also seen in the pancreatic tail measuring 1.8 x 1.3 cm on image 27/3. 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 No evidence of pancreatic ductal dilatation. Spleen:  Within normal limits in size and appearance. Adrenals/Urinary Tract: No masses identified. No evidence of hydronephrosis. Stomach/Bowel: Small hiatal hernia noted. Mild diffuse small bowel wall thickening and mesenteric edema, likely due to hypoalbuminemia. Vascular/Lymphatic: No pathologically enlarged lymph nodes identified. No acute vascular findings. Other:  Mild ascites.  Diffuse mesenteric and body wall edema. Musculoskeletal:  No suspicious bone lesions identified. 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 Recommend continued follow-up by MRI or CT in 6 months. Hepatic cirrhosis. No evidence of hepatic neoplasm. Mild ascites, diffuse mesenteric and body wall edema, and small hiatal hernia. Diffuse small bowel wall thickening, likely due to hypoalbuminemia. Cholelithiasis. No radiographic evidence of cholecystitis or biliary ductal dilatation. Electronically Signed   By: JMarlaine HindM.D.   On: 04/07/2021  12:17       ASSESSMENT & PLAN:  1. Light chain (AL) amyloidosis (HCC)   2. Encounter for antineoplastic chemotherapy   3. Hyperbilirubinemia   4. Iron deficiency anemia due to chronic blood loss   5. Elevated LFTs   6. Hypoalbuminemia     # AL-Amyloidosis Liver involvement, possible kidney involvement-on second line treatment with daratumumab Revlimid dexamethasone. Labs are reviewed and discussed with patient. Proceed with daratumumab on 06/26/2021.  patient will take dexamethasone 20 mg weekly Proceed with Revlimid 10 mg 3 weeks on 1 week off on 06/26/2021. Amyloidosis labs from 05/29/2021 showed stable M protein, stable free light chain ratio and levels. She has follow up appt with [facility]   #Iron deficiency anemia due to ongoing vaginal blood loss. Previously received IV iron treatments and blood transfusion for Hemoglobin stable at 10.1, continue oral iron supplementation 325 mg 3 times daily.   #Elevated LFT/alkaline phosphatase and bilirubin, due to amyloidosis liver involvement-hepatic cirrhosis. Alkaline phosphatase increased to 938, bilirubin 5.2. Patient has seen both [facility] hepatology and local GI.  Overall prognosis is very poor. Repeat [place]  # Uncontrolled DM, weekly Dexamethasone use.  Continue Lantus and sliding scale per endocrinology recommendation.  #Intermittent vaginal bleeding.  Resolved. #Hypokalemia,  Continue potassium chloride 263m twice daily  #Cystic pancreatic cyst, MRI findings were reviewed and discussed with patient. Possible pseudocyst, less likely cystic pancreatic neoplasm.  Given her poor prognosis from amyloidosis, recommend observation.  #Malnutrition, weight loss, hypoalbuminemia Recommend patient continue follow-up with nutritionist.  Encourage  nutrition supplements.  #Goals of care, prognosis is poor.  Recommend patient continue follow-up with palliative care service.  #Abdominal wall subcutaneous nodules, possible cutaneous  amyloidosis versus soft tissue hardening due to insulin injections.  Monitor.  We spent sufficient time to discuss many aspect of care, questions were answered to patient's satisfaction.  Lab MD 4 weeks lab MD Daratumumab/ Revlimid/Venofer, also see pallaitve care service.   cc Nancy Ramsay, MD   Nancy Server, MD, PhD  06/23/2021

## 2021-06-23 NOTE — Addendum Note (Signed)
Addended by: [name] on: 06/23/2021 09:06 PM ? ? Modules accepted: Orders ? ?

## 2021-06-24 LAB — BETA 2 MICROGLOBULIN, SERUM: Beta-2 Microglobulin: 2.4 mg/L (ref 0.6–2.4)

## 2021-06-26 ENCOUNTER — Inpatient Hospital Stay (HOSPITAL_BASED_OUTPATIENT_CLINIC_OR_DEPARTMENT_OTHER): Payer: Medicare Other | Admitting: Hospice and Palliative Medicine

## 2021-06-26 ENCOUNTER — Telehealth: Payer: Self-pay

## 2021-06-26 ENCOUNTER — Other Ambulatory Visit: Payer: Self-pay

## 2021-06-26 ENCOUNTER — Inpatient Hospital Stay: Payer: Medicare Other

## 2021-06-26 VITALS — BP 142/56 | HR 82 | Temp 98.9°F | Resp 16 | Wt 122.5 lb

## 2021-06-26 DIAGNOSIS — E8581 Light chain (AL) amyloidosis: Secondary | ICD-10-CM

## 2021-06-26 DIAGNOSIS — E859 Amyloidosis, unspecified: Secondary | ICD-10-CM | POA: Diagnosis not present

## 2021-06-26 DIAGNOSIS — Z515 Encounter for palliative care: Secondary | ICD-10-CM | POA: Diagnosis not present

## 2021-06-26 LAB — MULTIPLE MYELOMA PANEL, SERUM
Albumin SerPl Elph-Mcnc: 2.4 g/dL — ABNORMAL LOW (ref 2.9–4.4)
Albumin/Glob SerPl: 1 (ref 0.7–1.7)
Alpha 1: 0.4 g/dL (ref 0.0–0.4)
Alpha2 Glob SerPl Elph-Mcnc: 0.8 g/dL (ref 0.4–1.0)
B-Globulin SerPl Elph-Mcnc: 1 g/dL (ref 0.7–1.3)
Gamma Glob SerPl Elph-Mcnc: 0.4 g/dL (ref 0.4–1.8)
Globulin, Total: 2.6 g/dL (ref 2.2–3.9)
IgA: 118 mg/dL (ref 64–422)
IgG (Immunoglobin G), Serum: 449 mg/dL — ABNORMAL LOW (ref 586–1602)
IgM (Immunoglobulin M), Srm: 75 mg/dL (ref 26–217)
Total Protein ELP: 5 g/dL — ABNORMAL LOW (ref 6.0–8.5)

## 2021-06-26 MED ORDER — DIPHENHYDRAMINE HCL 25 MG PO CAPS
50.0000 mg | ORAL_CAPSULE | Freq: Once | ORAL | Status: AC
  Administered 2021-06-26: 50 mg via ORAL
  Filled 2021-06-26: qty 2

## 2021-06-26 MED ORDER — ACETAMINOPHEN 325 MG PO TABS
325.0000 mg | ORAL_TABLET | Freq: Once | ORAL | Status: AC
  Administered 2021-06-26: 325 mg via ORAL
  Filled 2021-06-26: qty 1

## 2021-06-26 MED ORDER — DEXAMETHASONE 4 MG PO TABS
20.0000 mg | ORAL_TABLET | Freq: Once | ORAL | Status: AC
  Administered 2021-06-26: 20 mg via ORAL
  Filled 2021-06-26: qty 5

## 2021-06-26 MED ORDER — DARATUMUMAB-HYALURONIDASE-FIHJ 1800-30000 MG-UT/15ML ~~LOC~~ SOLN
1800.0000 mg | Freq: Once | SUBCUTANEOUS | Status: AC
  Administered 2021-06-26: 1800 mg via SUBCUTANEOUS
  Filled 2021-06-26: qty 15

## 2021-06-26 NOTE — Telephone Encounter (Signed)
-----   Message from [name], MD sent at 06/23/2021  8:34 PM EST ----- ?Please let her know that I recommend to repeat US abdomen to rule out other causes of worsened liver function. Please arrange.  US-abd complete- amyloidosis/elevated LFT ? ?

## 2021-06-26 NOTE — Telephone Encounter (Signed)
Pt informed of recommendation to repeat US abdomen and verbalized understanding.  ? ?Please schedule [place] abd - next availa and inform pt of appt  ?

## 2021-06-27 ENCOUNTER — Encounter: Payer: Self-pay | Admitting: Oncology

## 2021-06-27 NOTE — Progress Notes (Signed)
? ?  ?Palliative Medicine ?[place]  ?[phone] [phone] ? ? ?Name: Nancy Rodriguez ?Date: 06/27/2021 ?MRN: [id number]  ?DOB: 1945/04/20 ? ?Patient Care Team: ?Leonel Ramsay, MD as PCP - General (Infectious Diseases) ?Nelva Bush, MD as PCP - Cardiology (Cardiology) ?[name], MD as Consulting Physician (Oncology)  ? ? ?REASON FOR CONSULTATION: ?Nancy Rodriguez is a 76 y.o. female with multiple medical problems including monoclonal gammopathy, CKD stage 3b, anemia, RA, diabetes, cirrhosis, and AL amyloidosis.  Patient declined bone marrow transplant and was started on [facility] CyBorD and then later rotated to [facility] RD chemotherapy.  Palliative care was consulted to help address goals. ? ?SOCIAL HISTORY:    ? reports that she has never smoked. She has never used smokeless tobacco. She reports that she does not drink alcohol and does not use drugs. ? ?Patient is widowed.  She currently lives at home with her daughter.  In total, patient has 4 sons and 2 daughters.  Patient previously lived in [place] and was an Mining engineer at [employer]. ? ?ADVANCE DIRECTIVES:  ?Does not have ? ?CODE STATUS:  ? ?PAST MEDICAL HISTORY: ?Past Medical History:  ?Diagnosis Date  ? Anemia   ? Anemia in chronic kidney disease  ? Chronic kidney disease   ? Stage 3b chronic kidney disease  ? Diabetes mellitus without complication ([place])   ? Hypercholesterolemia   ? Hypertension   ? MGUS (monoclonal gammopathy of unknown significance)   ? Osteoarthritis   ? Rheumatoid arthritis ([place])   ? ? ?PAST SURGICAL HISTORY:  ?Past Surgical History:  ?Procedure Laterality Date  ? HERNIA REPAIR    ? PARATHYROIDECTOMY    ? ? ?HEMATOLOGY/ONCOLOGY HISTORY:  ?Oncology History  ? No history exists.  ? ? ?ALLERGIES:  is allergic to benazepril, lisinopril, tolmetin, and nsaids. ? ?MEDICATIONS:  ?Current Outpatient Medications  ?Medication Sig Dispense Refill  ? acyclovir (ZOVIRAX) 400 MG tablet Take 1  tablet (400 mg total) by mouth 2 (two) times daily. 60 tablet 2  ? albuterol (VENTOLIN HFA) 108 (90 Base) MCG/ACT inhaler Inhale 2 puffs into the lungs every 4 (four) hours as needed for wheezing or shortness of breath.    ? amLODipine (NORVASC) 5 MG tablet Take 5 mg by mouth daily.    ? Continuous Blood Gluc Sensor (FREESTYLE LIBRE 14 DAY SENSOR) MISC SMARTSIG:1 Kit(s) Topical Every 2 Weeks    ? CVS VITAMIN C 500 MG tablet TAKE 1 TABLET BY MOUTH DAILY 30 tablet 1  ? dexamethasone (DECADRON) 4 MG tablet TAKE 5 TABLETS BY MOUTH ONCE A WEEK. 60 tablet 0  ? EPINEPHrine 0.3 mg/0.3 mL IJ SOAJ injection Inject 0.3 mg into the muscle as needed. (Patient not taking: Reported on 06/23/2021)    ? gabapentin (NEURONTIN) 100 MG capsule Take 100 mg by mouth at bedtime.    ? insulin glargine (LANTUS SOLOSTAR) 100 UNIT/ML Solostar Pen Inject 15 Units into the skin at bedtime.    ? insulin lispro (HUMALOG) 100 UNIT/ML injection Inject into the skin 3 (three) times daily before meals. Sliding scale    ? lenalidomide (REVLIMID) 10 MG capsule Take 1 capsule (10 mg total) by mouth daily. Take for 21 days, then hold for 7 days. Repeat every 28 days. 7 capsule 0  ? loperamide (IMODIUM) 2 MG capsule Take 1 capsule (2 mg total) by mouth See admin instructions. Initial: 4 mg, followed by 2 mg after each loose stool, maximum 16 mg within 24  hours. (Patient not taking: Reported on 06/23/2021) 60 capsule 0  ? metoprolol succinate (TOPROL-XL) 50 MG 24 hr tablet Take 50 mg by mouth daily.    ? omeprazole (PRILOSEC) 40 MG capsule Take 40 mg by mouth daily.    ? potassium chloride SA (KLOR-CON M) 20 MEQ tablet Take 1 tablet (20 mEq total) by mouth 2 (two) times daily. 60 tablet 1  ? sertraline (ZOLOFT) 25 MG tablet Take 50 mg by mouth daily.    ? torsemide (DEMADEX) 20 MG tablet Take 20 mg by mouth daily as needed.    ? ?No current facility-administered medications for this visit.  ? ? ?VITAL SIGNS: ?There were no vitals taken for this visit. ?There  were no vitals filed for this visit.  ?Estimated body mass index is 21.35 kg/m? as calculated from the following: ?  Height as of 05/18/21: 5' 3.5" (1.613 m). ?  Weight as of an earlier encounter on 06/26/21: 122 lb 7.5 oz (55.6 kg). ? ?LABS: ?CBC: ?   ?Component Value Date/Time  ? WBC 4.3 06/23/2021 0953  ? HGB 10.1 (L) 06/23/2021 0953  ? HCT 28.8 (L) 06/23/2021 0953  ? PLT 519 (H) 06/23/2021 0953  ? MCV 78.3 (L) 06/23/2021 0953  ? NEUTROABS 3.6 06/23/2021 0953  ? LYMPHSABS 0.5 (L) 06/23/2021 0240  ? MONOABS 0.1 06/23/2021 0953  ? EOSABS 0.0 06/23/2021 0953  ? BASOSABS 0.1 06/23/2021 0953  ? ?Comprehensive Metabolic Panel: ?   ?Component Value Date/Time  ? NA 134 (L) 06/23/2021 0953  ? K 4.1 06/23/2021 0953  ? CL 111 06/23/2021 0953  ? CO2 15 (L) 06/23/2021 0953  ? BUN 33 (H) 06/23/2021 0953  ? CREATININE 1.04 (H) 06/23/2021 0953  ? GLUCOSE 404 (H) 06/23/2021 0953  ? CALCIUM 8.7 (L) 06/23/2021 0953  ? AST 42 (H) 06/23/2021 0953  ? ALT 46 (H) 06/23/2021 0953  ? ALKPHOS 938 (H) 06/23/2021 9735  ? BILITOT 5.2 (H) 06/23/2021 3299  ? PROT 5.7 (L) 06/23/2021 0953  ? ALBUMIN 2.5 (L) 06/23/2021 0953  ? ? ?RADIOGRAPHIC STUDIES: ?No results found. ? ? ?PERFORMANCE STATUS (ECOG) : 1 - Symptomatic but completely ambulatory ? ?Review of Systems ?Unless otherwise noted, a complete review of systems is negative. ? ?Physical Exam ?General: NAD ?Pulmonary: Unlabored ?Extremities: no edema, no joint deformities ?Skin: no rashes ?Neurological: Weakness but otherwise nonfocal ? ?IMPRESSION: ?Patient seen in infusion while she is receiving treatment.   ? ?Patient denies significant changes or concerns today.  She is not having any significant symptomatic complaints.  She reports that she remains fairly weak and was unsure about falls.  She felt like her appetite has improved. ? ?I met with patient's daughter.  Her daughter verbalized some concerns about feeling overwhelmed at times given the stressors of caring for her own health issues  as well as her mother's.  We will send referral to social work to see if there are any resources that benefit patient/ ? ?PLAN: ?-Continue current scope of treatment ?-Referrals for rehab screening ?-MOST Form previously reviewed ?-RTC 1 month ? ?Patient expressed understanding and was in agreement with this plan. She also understands that She can call the clinic at any time with any questions, concerns, or complaints.  ? ? ? ?Time Total: 25 minutes ? ?Visit consisted of counseling and education dealing with the complex and emotionally intense issues of symptom management and palliative care in the setting of serious and potentially life-threatening illness.Greater than 50%  of this time was  spent counseling and coordinating care related to the above assessment and plan. ? ?Signed by: ?Altha Harm, PhD, NP-C ? ? ?

## 2021-06-28 ENCOUNTER — Ambulatory Visit (INDEPENDENT_AMBULATORY_CARE_PROVIDER_SITE_OTHER): Payer: Medicare Other

## 2021-06-28 ENCOUNTER — Other Ambulatory Visit: Payer: Self-pay

## 2021-06-28 DIAGNOSIS — R600 Localized edema: Secondary | ICD-10-CM | POA: Diagnosis not present

## 2021-06-28 DIAGNOSIS — E8581 Light chain (AL) amyloidosis: Secondary | ICD-10-CM

## 2021-06-28 DIAGNOSIS — R609 Edema, unspecified: Secondary | ICD-10-CM

## 2021-06-28 LAB — ECHOCARDIOGRAM COMPLETE
AR max vel: 1.57 cm2
AV Area VTI: 1.48 cm2
AV Area mean vel: 1.6 cm2
AV Mean grad: 5 mmHg
AV Peak grad: 9.2 mmHg
Ao pk vel: 1.51 m/s
Area-P 1/2: 3.45 cm2
S' Lateral: 4 cm
Single Plane A4C EF: 45.8 %

## 2021-06-28 LAB — KAPPA/LAMBDA LIGHT CHAINS
Kappa free light chain: 13.4 mg/L (ref 3.3–19.4)
Kappa, lambda light chain ratio: 1.63 (ref 0.26–1.65)
Lambda free light chains: 8.2 mg/L (ref 5.7–26.3)

## 2021-06-29 ENCOUNTER — Other Ambulatory Visit: Payer: Self-pay

## 2021-06-29 ENCOUNTER — Ambulatory Visit (INDEPENDENT_AMBULATORY_CARE_PROVIDER_SITE_OTHER): Payer: Medicare Other | Admitting: Nurse Practitioner

## 2021-06-29 ENCOUNTER — Encounter: Payer: Self-pay | Admitting: Nurse Practitioner

## 2021-06-29 VITALS — BP 120/60 | HR 84 | Ht 63.0 in | Wt 121.0 lb

## 2021-06-29 DIAGNOSIS — I10 Essential (primary) hypertension: Secondary | ICD-10-CM | POA: Diagnosis not present

## 2021-06-29 DIAGNOSIS — N183 Chronic kidney disease, stage 3 unspecified: Secondary | ICD-10-CM | POA: Diagnosis not present

## 2021-06-29 DIAGNOSIS — I5032 Chronic diastolic (congestive) heart failure: Secondary | ICD-10-CM | POA: Diagnosis not present

## 2021-06-29 DIAGNOSIS — E8581 Light chain (AL) amyloidosis: Secondary | ICD-10-CM | POA: Diagnosis not present

## 2021-06-29 NOTE — Progress Notes (Signed)
? ? ?Office Visit  ?  ?Patient Name: Nancy Rodriguez ?Date of Encounter: 06/29/2021 ? ?Primary Care Provider:  Leonel Ramsay, MD ?Primary Cardiologist:  Nelva Bush, MD ? ?Chief Complaint  ?  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 ? ?Past Medical History  ?  ?Past Medical History:  ?Diagnosis Date  ? (HFpEF) heart failure with preserved ejection fraction ([place])   ? a. 05/2020 Echo: EF 60-65%; b. 08/2020 cMRI ([facility]): EF 71%, no delayed hyperenhancement to suggest scar/infiltrative dzs. Nl RV fxn. Mild BAE. Triv MR, mild TR; c. 06/2021 Echo: EF 50-55%, no rwma, mild basal-septal LVH, GrII DD. Nl RV size/fxn. Mild MR. Ao sclerosis.  ? AL amyloidosis ([place])   ? Anemia due to chronic kidney disease   ? Anemia in chronic kidney disease  ? CKD (chronic kidney disease), stage III ([place])   ? Diabetes mellitus without complication ([place])   ? History of cardiovascular stress test   ? a. 03/2012 Stress Echo([facility]): nl study.  ? Hypercholesterolemia   ? Hypertension   ? MGUS (monoclonal gammopathy of unknown significance)   ? Osteoarthritis   ? Rheumatoid arthritis ([place])   ? ?Past Surgical History:  ?Procedure Laterality Date  ? HERNIA REPAIR    ? PARATHYROIDECTOMY    ? ? ?Allergies ? ?Allergies  ?Allergen Reactions  ? Benazepril Anaphylaxis and Swelling  ?  TONGUE AND LIPS ? ?  ? Lisinopril Swelling  ? Tolmetin Swelling  ? Nsaids Swelling  ? ? ?History of Present Illness  ?  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  She was previously evaluated by Dr. Saunders Revel in early 2022 in the setting of  lower extremity swelling, which improved after initiation of Lasix 40 mg daily and regular use of compression socks.  An echocardiogram at that time showed an EF of 60 to 65% with mild asymmetric LVH of the basal-septal segment.  Mild MR and aortic sclerosis were noted.  Cardiac MRI was performed @ [facility] in 08/2020, which showed nl EF and no hyperenhancement to suggest scar or infiltration.  Her Lasix was reduced to 20 mg daily in June 2022 and she was subsequently lost to follow-up.  In January 2023, she was admitted to [facility] with hyperglycemia and acute kidney injury complicated by UTI and a fall with head CT showing chronic appearing subdural hematomas.  She subsequently followed up here in early February, at which time she was noted to have mild lower extremity swelling on torsemide 20 mg as needed, which she had not been receiving.  In the setting of history of amyloidosis and chemotherapy, repeat echocardiogram was performed which showed an EF of 50 to 55% without regional wall motion abnormalities, mild basal-septal LVH, grade 2 diastolic dysfunction, normal RV function, and mild mitral regurgitation and aortic sclerosis. ? ?Since her last visit, Ms. Shane has felt reasonably well.  Her daughter is present with her today.  They have not noticed very much in the way of lower extremity swelling but when they do, it is usually at the end of the day and is not necessarily present the following morning.  Her daughter has given her torsemide maybe about once a  week or less since her last visit here.  We discussed echocardiogram findings today.  Patient denies chest pain, dyspnea, palpitations, PND, orthopnea, dizziness, syncope, or early satiety. ? ?Home Medications  ?  ?Current Outpatient Medications  ?Medication Sig Dispense Refill  ? acyclovir (ZOVIRAX) 400 MG tablet Take 1 tablet (400 mg total) by mouth 2 (two) times daily. 60 tablet 2  ? albuterol (VENTOLIN HFA) 108 (90 Base) MCG/ACT inhaler Inhale 2 puffs into  the lungs every 4 (four) hours as needed for wheezing or shortness of breath.    ? amLODipine (NORVASC) 5 MG tablet Take 5 mg by mouth daily.    ? Continuous Blood Gluc Sensor (FREESTYLE LIBRE 14 DAY SENSOR) MISC SMARTSIG:1 Kit(s) Topical Every 2 Weeks    ? CVS VITAMIN C 500 MG tablet TAKE 1 TABLET BY MOUTH DAILY 30 tablet 1  ? dexamethasone (DECADRON) 4 MG tablet TAKE 5 TABLETS BY MOUTH ONCE A WEEK. 60 tablet 0  ? gabapentin (NEURONTIN) 100 MG capsule Take 100 mg by mouth at bedtime.    ? insulin glargine (LANTUS SOLOSTAR) 100 UNIT/ML Solostar Pen Inject 15 Units into the skin at bedtime.    ? insulin lispro (HUMALOG) 100 UNIT/ML injection Inject into the skin 3 (three) times daily before meals. Sliding scale    ? lenalidomide (REVLIMID) 10 MG capsule Take 1 capsule (10 mg total) by mouth daily. Take for 21 days, then hold for 7 days. Repeat every 28 days. 7 capsule 0  ? loperamide (IMODIUM) 2 MG capsule Take 1 capsule (2 mg total) by mouth See admin instructions. Initial: 4 mg, followed by 2 mg after each loose stool, maximum 16 mg within 24 hours. 60 capsule 0  ? metoprolol succinate (TOPROL-XL) 50 MG 24 hr tablet Take 50 mg by mouth daily.    ? omeprazole (PRILOSEC) 40 MG capsule Take 40 mg by mouth daily.    ? potassium chloride SA (KLOR-CON M) 20 MEQ tablet Take 1 tablet (20 mEq total) by mouth 2 (two) times daily. 60 tablet 1  ? sertraline (ZOLOFT) 25 MG tablet Take 50 mg by mouth daily.    ? torsemide (DEMADEX) 20 MG tablet Take 20 mg by mouth daily as needed.    ? EPINEPHrine 0.3 mg/0.3 mL IJ SOAJ injection Inject 0.3 mg into the muscle as needed. (Patient not taking: Reported on 06/23/2021)    ? ?No current facility-administered medications for this visit.  ?  ? ?Review of Systems  ?  ?Occasional mild lower extremity swelling.  She denies chest pain, dyspnea, palpitations, PND, orthopnea, dizziness, syncope, or early satiety.  All other systems reviewed and are otherwise negative except as noted above. ?   ? ?Physical Exam  ?  ?VS:  BP 120/60 (BP Location: Left Arm, Patient Position: Sitting, Cuff Size: Normal)   Pulse 84   Ht 5' 3" (1.6 m)   Wt 121 lb (54.9 kg)   SpO2 99%   BMI 21.43 kg/m?  , BMI Body mass index is 21.43 kg/m?. ?    ?GEN: Thin and frail, in no acute distress. ?HEENT: normal. ?Neck: Supple, no JVD, carotid bruits, or masses. ?Cardiac: RRR, no murmurs, rubs, or gallops. No clubbing, cyanosis.  Trace right greater than left lower extremity swelling.  Radials/PT 2+ and equal bilaterally.  ?Respiratory:  Respirations regular and unlabored, clear to auscultation bilaterally. ?GI: Soft, nontender, nondistended, BS + x 4. ?MS: no deformity or atrophy. ?Skin: warm and dry, no rash. ?Neuro:  Strength and sensation  are intact. ?Psych: Normal affect. ? ?Accessory Clinical Findings  ?  ? ?Lab Results  ?Component Value Date  ? WBC 4.3 06/23/2021  ? HGB 10.1 (L) 06/23/2021  ? HCT 28.8 (L) 06/23/2021  ? MCV 78.3 (L) 06/23/2021  ? PLT 519 (H) 06/23/2021  ? ?Lab Results  ?Component Value Date  ? CREATININE 1.04 (H) 06/23/2021  ? BUN 33 (H) 06/23/2021  ? NA 134 (L) 06/23/2021  ? K 4.1 06/23/2021  ? CL 111 06/23/2021  ? CO2 15 (L) 06/23/2021  ? ?Lab Results  ?Component Value Date  ? ALT 46 (H) 06/23/2021  ? AST 42 (H) 06/23/2021  ? ALKPHOS 938 (H) 06/23/2021  ? BILITOT 5.2 (H) 06/23/2021  ? ? ?Assessment & Plan  ?  ?1.  Chronic HFpEF: EF of 60 to 65% by echo November 2022 and 71% by cardiac MRI May 2022.  She recently had repeat echo in the setting of mild lower extremity swelling for which she has been using as needed torsemide, and this showed low normal EF at 50 to 55% without regional wall motion abnormalities.  She has persistent, mild basal-septal LVH, and grade 2 diastolic dysfunction.  She denies dyspnea and only notes occasional lower extremity swelling, which is largely dependent in nature.  She does have a prescription for torsemide which her daughter gives her about once a week or less.  We discussed  using objective measures such as weight for torsemide utilization.  Her dry weight appears to be about 119 pounds on her home scale and they will reserve torsemide usage for weight of 3 pounds or greater in

## 2021-06-29 NOTE — Patient Instructions (Signed)
Medication Instructions:  No changes at this time.   *If you need a refill on your cardiac medications before your next appointment, please call your pharmacy*   Lab Work: None  If you have labs (blood work) drawn today and your tests are completely normal, you will receive your results only by: MyChart Message (if you have MyChart) OR A paper copy in the mail If you have any lab test that is abnormal or we need to change your treatment, we will call you to review the results.   Testing/Procedures: None   Follow-Up: At [facility], you and your health needs are our priority.  As part of our continuing mission to provide you with exceptional heart care, we have created designated Provider Care Teams.  These Care Teams include your primary Cardiologist (physician) and Advanced Practice Providers (APPs -  Physician Assistants and Nurse Practitioners) who all work together to provide you with the care you need, when you need it.   Your next appointment:   3 month(s)  The format for your next appointment:   In Person  Provider:   Christopher End, MD or Christopher Berge, NP  

## 2021-06-30 ENCOUNTER — Ambulatory Visit
Admission: RE | Admit: 2021-06-30 | Discharge: 2021-06-30 | Disposition: A | Discharge status: Home / Self Care | Payer: Medicare Other | Source: Ambulatory Visit | Attending: Oncology | Admitting: Oncology

## 2021-06-30 ENCOUNTER — Telehealth: Payer: Self-pay | Admitting: Nurse Practitioner

## 2021-06-30 DIAGNOSIS — E8581 Light chain (AL) amyloidosis: Secondary | ICD-10-CM | POA: Diagnosis present

## 2021-06-30 IMAGING — US US ABDOMEN COMPLETE
1 series · 13 of 25 positions shown · non-contrast
Comparison: Ultrasound [DATE], MRI [DATE]

CLINICAL DATA: Light chain amyloidosis.  Elevated LFTs.

EXAM:
ABDOMEN ULTRASOUND COMPLETE

[Series 1: us abdomen complete · 13 of 121 slices shown]
[im 1/121]
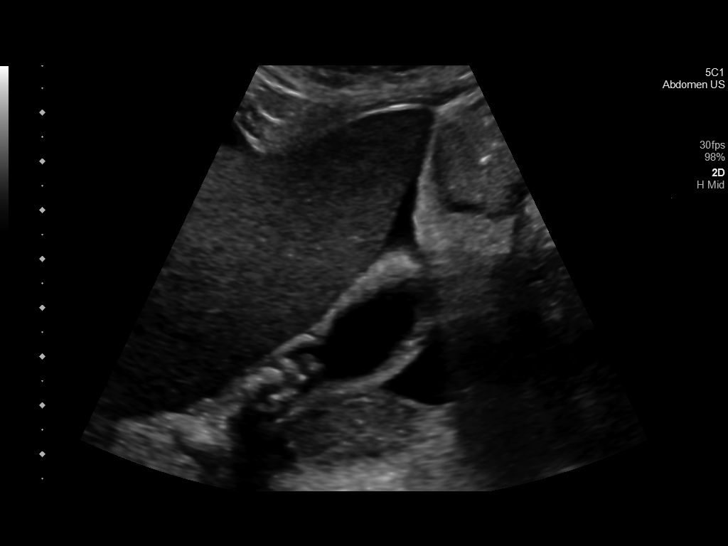
[im 11/121]
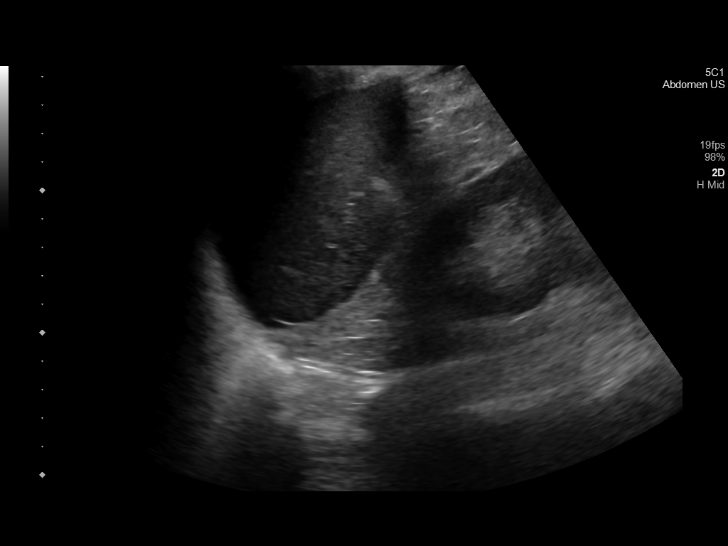
[im 21/121]
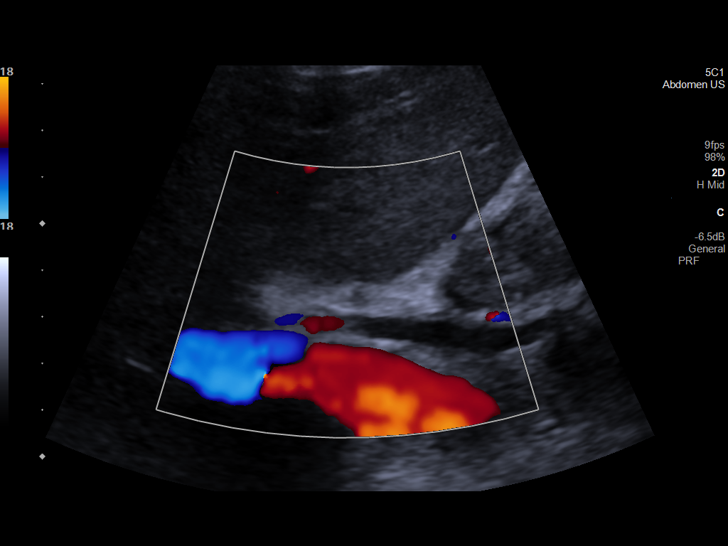
[im 31/121]
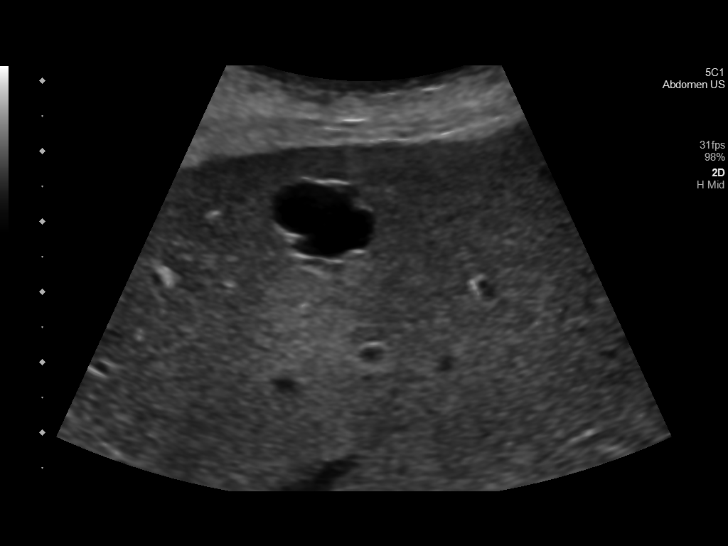
[im 41/121]
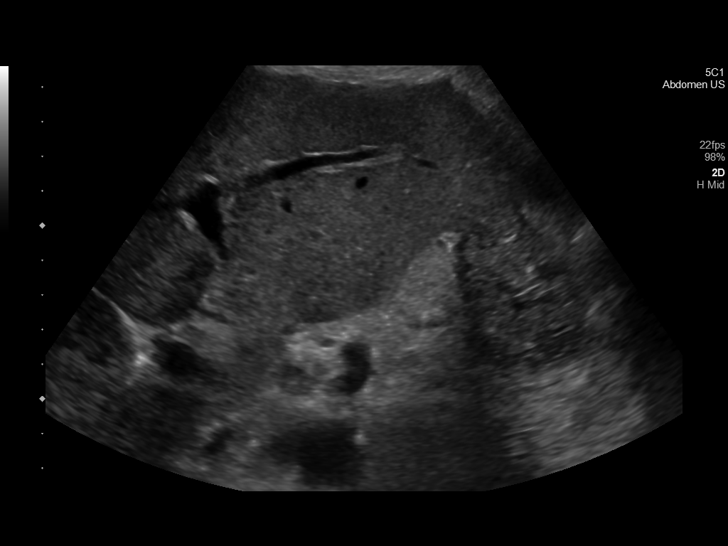
[im 51/121]
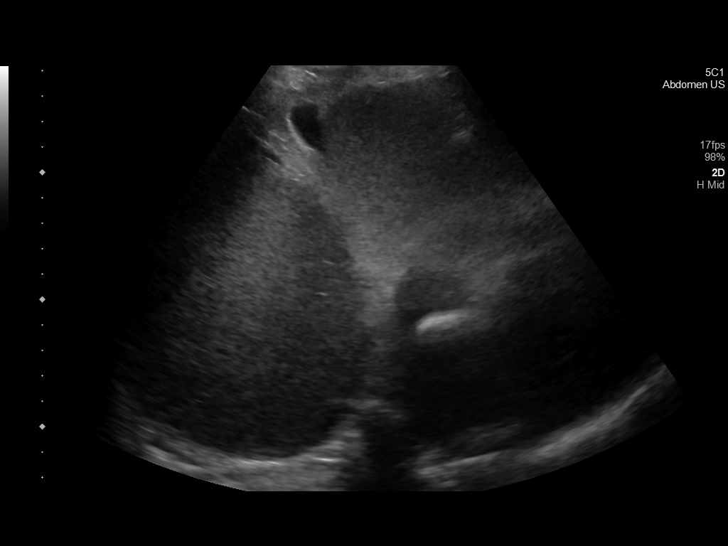
[im 61/121]
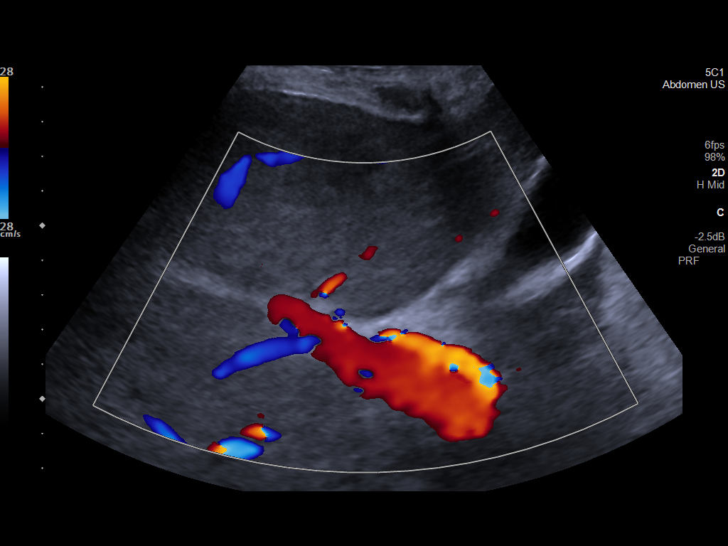
[im 71/121]
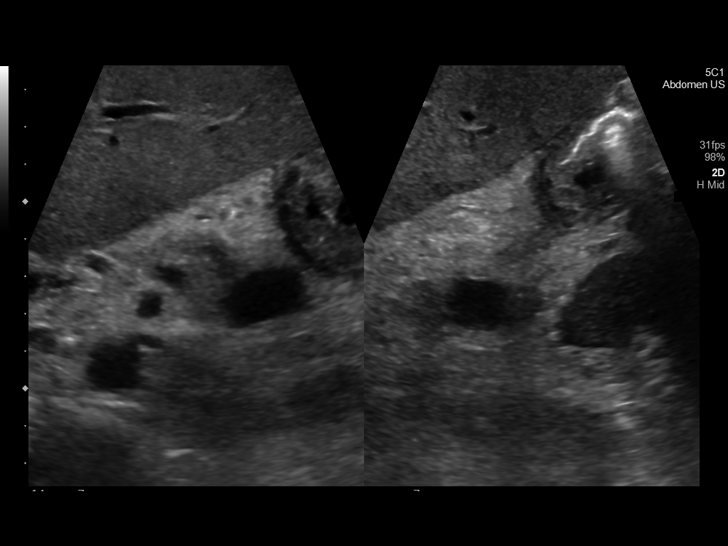
[im 81/121]
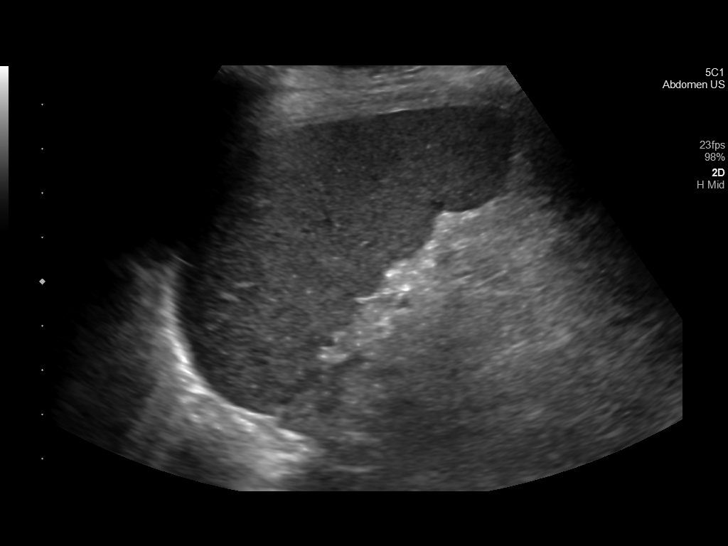
[im 91/121]
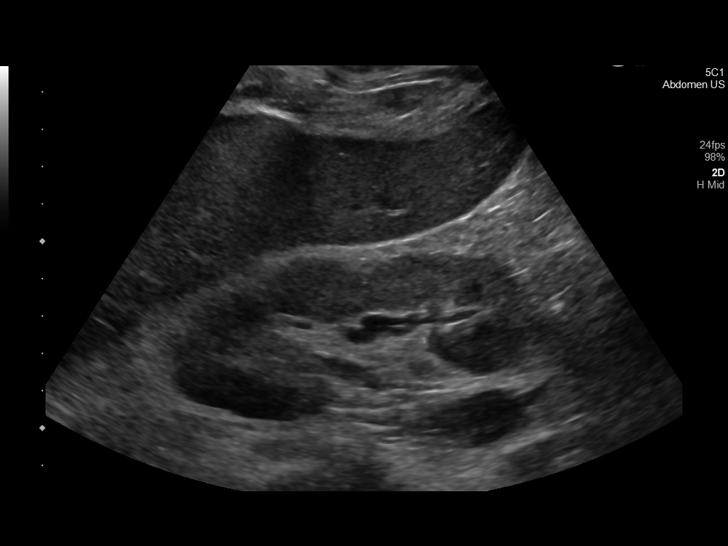
[im 101/121]
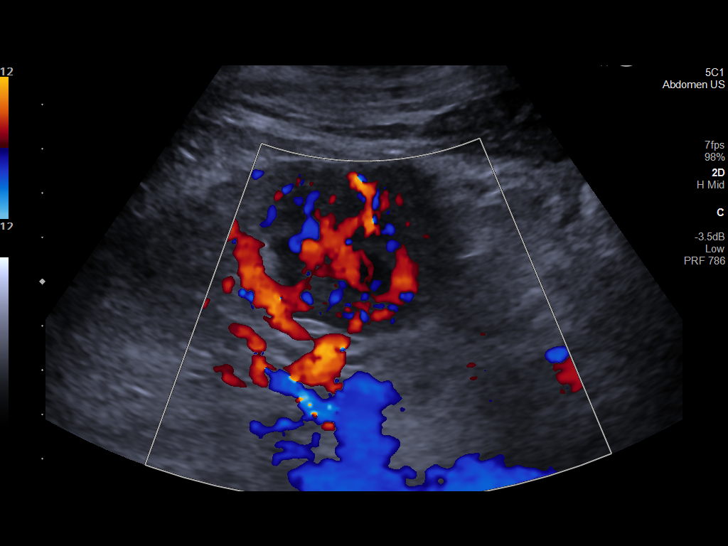
[im 111/121]
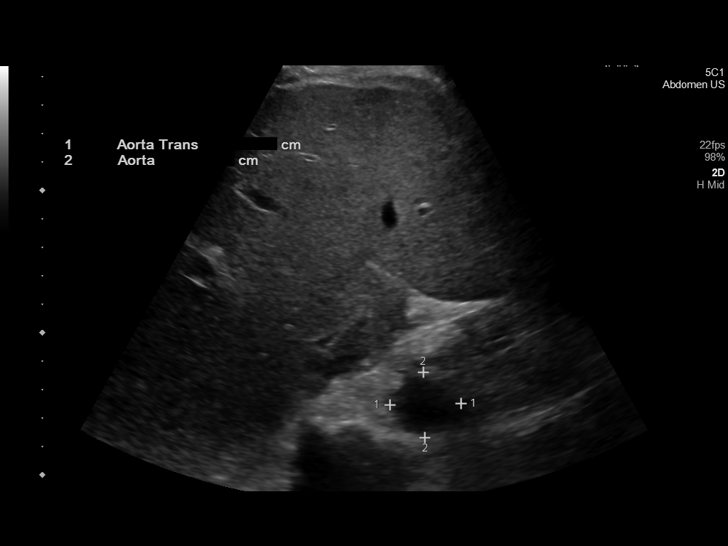
[im 121/121]
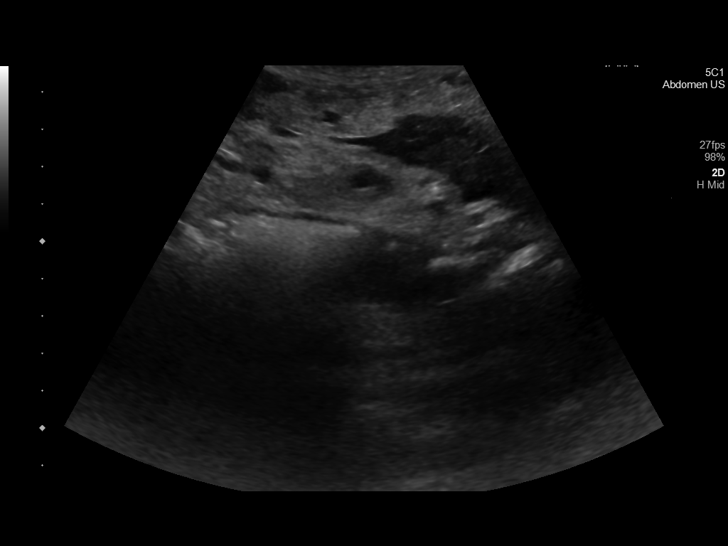

[13 of 25 positions shown; findings below may reference images not displayed]

FINDINGS: Gallbladder: Physiologically distended containing multiple
intraluminal gallstones. Mild gallbladder wall thickening measuring
3-4 mm, nonspecific in the presence of ascites. No sonographic
Murphy sign noted by sonographer.

Common bile duct: Diameter: 4 mm, normal.

Liver: Heterogeneous and coarsened hepatic parenchymal echogenicity,
mildly increased. Slight capsular nodularity. 2 x 1.4 x 1.5 cm
septated cyst in the left lobe, also seen on prior MRI. No evidence
of solid lesion. Portal vein is patent on color Doppler imaging with
normal direction of blood flow towards the liver.

IVC: No abnormality visualized.

Pancreas: Cystic lesion in the pancreatic head measures 2.6 x 1.5 x
1.4 cm cyst in the pancreatic tail measures 2.4 x 1.5 x 1.4 cm.
These are not significantly changed in size from intervening MRI.
Pancreatic duct measures 3 mm, upper normal.

Spleen: Size and appearance within normal limits.

Right Kidney: Length: 9.7 cm. Normal parenchymal echogenicity. No
hydronephrosis. No visualized stone or focal lesion.

Left Kidney: Length: 10.4 cm. Normal parenchymal echogenicity. No
hydronephrosis. No visualized stone or focal lesion.

Abdominal aorta: No aneurysm visualized.

Other findings: Small volume ascites primarily in the right upper
quadrant.
IMPRESSION: 1. Coarsened heterogeneous hepatic parenchyma with nodular contour
suggestive of cirrhosis.
2. Septated cyst in the left hepatic lobe.  No solid lesion.
3. Stable sonographic appearance of cysts in the pancreas head and
tail which have been assessed on MRI, for which MRI or CT follow-up
was recommended.
4. Small volume abdominal ascites.
5. Gallstones. Mild gallbladder wall thickening is nonspecific in
the presence of ascites, likely reactive.

## 2021-06-30 NOTE — Telephone Encounter (Signed)
Attempted to schedule.  LMOV to call office.  ° °

## 2021-07-03 ENCOUNTER — Encounter: Payer: Self-pay | Admitting: Licensed Clinical Social Worker

## 2021-07-03 ENCOUNTER — Telehealth: Payer: Self-pay

## 2021-07-03 ENCOUNTER — Other Ambulatory Visit: Payer: Self-pay

## 2021-07-03 DIAGNOSIS — S065XAA Traumatic subdural hemorrhage with loss of consciousness status unknown, initial encounter: Secondary | ICD-10-CM

## 2021-07-03 DIAGNOSIS — E8581 Light chain (AL) amyloidosis: Secondary | ICD-10-CM

## 2021-07-03 NOTE — Telephone Encounter (Signed)
Please schedule patient for :  ? ?Labs (PT, PT/INR, fibrinogen) this week or next. ?CT head - first available.  ? ?Patient informed of plant to obtain labs and CT, please inform pt or [name] of appts.  ?

## 2021-07-03 NOTE — Progress Notes (Signed)
[place] CSW Progress Note ? ?Clinical Social Worker  contacted patient and caregiver  to discuss referral from [place]. CSW received referral from [place] in reference to concerns of symptoms of depression and anxiety.  CSW left voicemail for  patient's primary caregiver Alexsandria, [place] (Daughter) [phone] with contact information and request for return call CSW spoke with patient, who stated "I do get to feelings that way [depressed] from time to time".  Patient stated she wanted CSW to speak with her daughter and discuss setting up an appointment.  CSW updated patient I had left a voicemail for her daughter. Patient verbalized understanding. ? ? ? ?Teal Raben , LCSW ?

## 2021-07-06 ENCOUNTER — Encounter: Payer: Self-pay | Admitting: Licensed Clinical Social Worker

## 2021-07-06 NOTE — Progress Notes (Signed)
[place] CSW Progress Note ? ?Clinical Social Worker contacted caregiver by phone to follow-up on conversation with patient.  CSW and caregiver Nancy Rodriguez discussed the different things the patient is doing now, which are different from her typical routine.  Specifically ms Moehring has noticed the patient is sleeping more and is also refusing to take her medication, stating she doesn't want to take it at that time.  Ms. Reimann stated the patient has otherwise maintained her normal routine.  Ms. Santarelli stated they have been many doctor's appointments "back-to-back" and last week there was a doctor's appointment scheduled everyday of the week.  Ms. Balthaser mentioned this was very exhausting for both her and the patient.  CSW and Ms. Coburn discussed different ways to cope with fatigue, including time management, advocating to have appointments scheduled on the same day and making sure she, as the caregiver, is aware of how she is feeling and finding ways to take care of herself. CSW recommended counseling for the patient and Ms. Lequire stated that this may be good for the patient.  CSW stated if she wanted to be included in the sessions with the patient, if the patient agreed, I could see them both to discuss caregiver/patient dynamic.  Ms. Bodenheimer stated she would discuss this with the patient and left me know if the patient was willing to schedule an appointment.  CSW updated Ms. Beidler on new caregiver connection session starting May 8th. Ms. Curet state she was interested and CSW emailed caregiver connection flyer to [email or web address].  Ms. Balinski stated she would contact CSW of there were any additional concerns or questions. ? ? ? ?Nancy Rodriguez , LCSW ?

## 2021-07-11 ENCOUNTER — Other Ambulatory Visit: Payer: Self-pay

## 2021-07-11 ENCOUNTER — Inpatient Hospital Stay: Payer: Medicare Other | Attending: Oncology

## 2021-07-11 DIAGNOSIS — E8581 Light chain (AL) amyloidosis: Secondary | ICD-10-CM

## 2021-07-11 DIAGNOSIS — E859 Amyloidosis, unspecified: Secondary | ICD-10-CM | POA: Insufficient documentation

## 2021-07-11 LAB — PROTIME-INR
INR: 1.3 — ABNORMAL HIGH (ref 0.8–1.2)
Prothrombin Time: 16 seconds — ABNORMAL HIGH (ref 11.4–15.2)

## 2021-07-11 LAB — FIBRINOGEN: Fibrinogen: 529 mg/dL — ABNORMAL HIGH (ref 210–475)

## 2021-07-11 LAB — APTT: aPTT: 33 seconds (ref 24–36)

## 2021-07-12 ENCOUNTER — Other Ambulatory Visit: Payer: Self-pay | Admitting: *Deleted

## 2021-07-12 ENCOUNTER — Ambulatory Visit
Admission: RE | Admit: 2021-07-12 | Discharge: 2021-07-12 | Disposition: A | Discharge status: Home / Self Care | Payer: Medicare Other | Source: Ambulatory Visit | Attending: Oncology | Admitting: Oncology

## 2021-07-12 DIAGNOSIS — S065XAA Traumatic subdural hemorrhage with loss of consciousness status unknown, initial encounter: Secondary | ICD-10-CM | POA: Diagnosis not present

## 2021-07-12 DIAGNOSIS — E8581 Light chain (AL) amyloidosis: Secondary | ICD-10-CM

## 2021-07-13 ENCOUNTER — Encounter: Payer: Self-pay | Admitting: Oncology

## 2021-07-13 MED ORDER — LENALIDOMIDE 10 MG PO CAPS: ORAL_CAPSULE | ORAL | 0 refills | Status: DC

## 2021-07-17 ENCOUNTER — Encounter: Payer: Medicare Other | Attending: Infectious Diseases

## 2021-07-17 DIAGNOSIS — R296 Repeated falls: Secondary | ICD-10-CM | POA: Insufficient documentation

## 2021-07-17 DIAGNOSIS — E859 Amyloidosis, unspecified: Secondary | ICD-10-CM

## 2021-07-17 DIAGNOSIS — E119 Type 2 diabetes mellitus without complications: Secondary | ICD-10-CM | POA: Insufficient documentation

## 2021-07-17 DIAGNOSIS — M6281 Muscle weakness (generalized): Secondary | ICD-10-CM | POA: Insufficient documentation

## 2021-07-17 NOTE — Progress Notes (Signed)
Patient and daughter arrive to clinic for CARE orientation. Virtual appointment was scheduled, however, patient and daughter showed up in person. Reviewed CARE program and went over medical history and interview questions  for the program. All questions answered. 6MWT  will be performed  tomorrow before exercise session is started. ?

## 2021-07-18 VITALS — Ht 62.0 in | Wt 123.9 lb

## 2021-07-18 DIAGNOSIS — E859 Amyloidosis, unspecified: Secondary | ICD-10-CM

## 2021-07-18 NOTE — Progress Notes (Signed)
CARE Daily Session Note ? ?Patient Details  ?Name: Nancy Rodriguez ?MRN: [id number] ?Date of Birth: Apr 23, 1945 ?Referring Provider:   ?Flowsheet Row Cancer Associated Rehabilitation & Exercise from 07/18/2021 in [facility] Cardiac and Pulmonary Rehab  ?Referring Provider [name] MD  ? ?  ? ? ?Encounter Date: 07/18/2021 ? ?Check In: ? Session Check In - 07/18/21 1447   ? ?  ? Check-In  ? Supervising physician immediately available to respond to emergencies See telemetry face sheet for immediately available ER MD   ? Location [facility]-Cardiac & Pulmonary Rehab   ? Staff Present Birdie Sons, MPA, Nino Glow, MS, ASCM CEP, Exercise Physiologist;Jessica Luan Pulling, MA, RCEP, CCRP, CCET   ? Virtual Visit No   ? Medication changes reported     No   ? Fall or balance concerns reported    No   ? Warm-up and Cool-down Not performed (comment)   6MWT  ? Resistance Training Performed No   ? VAD Patient? No   ? PAD/SET Patient? No   ? ?  ?  ? ?  ? ? ? 6 Minute Walk   ? ? [place] Name 07/18/21 1438  ?  ?  ?  ? 6 Minute Walk  ? Phase Initial    ? Distance 321 feet  steps; T4 Step test    ? Walk Time 6 minutes    ? # of Rest Breaks 0    ? MPH --  Average 53 spm    ? METS 1.4    ? RPE 13    ? Perceived Dyspnea  0    ? VO2 Peak --  N/A    ? Symptoms No    ? Resting HR 81 bpm    ? Resting BP 124/68    ? Resting Oxygen Saturation  98 %    ? Exercise Oxygen Saturation  during 6 min walk 97 %    ? Max Ex. HR 90 bpm    ? Max Ex. BP 142/60    ? 2 Minute Post BP 122/64    ? ?  ?  ? ?  ?  ? ? Exercise Prescription Changes - 07/18/21 1400   ? ?  ? Response to Exercise  ? Blood Pressure (Admit) 124/68   ? Blood Pressure (Exercise) 142/60   ? Blood Pressure (Exit) 122/64   ? Heart Rate (Admit) 81 bpm   ? Heart Rate (Exercise) 90 bpm   ? Heart Rate (Exit) 82 bpm   ? Oxygen Saturation (Admit) 98 %   ? Oxygen Saturation (Exercise) 97 %   ? Oxygen Saturation (Exit) 98 %   ? Rating of Perceived Exertion (Exercise) 13   ? Perceived Dyspnea (Exercise) 0   ?  Symptoms none   ? Comments step test results   ? ?  ?  ? ?  ? ? ?Social History  ? ?Tobacco Use  ?Smoking Status Never  ?Smokeless Tobacco Never  ? ? ?Goals Met:  ?Proper associated with RPD/PD & O2 Sat ?Exercise tolerated well ?Personal goals reviewed ?No report of concerns or symptoms today ? ?Goals Unmet:  ?Not Applicable ? ?Comments: First full day of orientation. All starting workloads were established based on the results of the 6 minute step test done at initial orientation visit.  The plan for exercise progression was also introduced and progression will be customized based on patient's performance and goals.  ? ? ?Dr. Emily Filbert is Medical Director for [facility].  ?Dr.  Ottie Glazier is Market researcher for [employer]. ?

## 2021-07-20 ENCOUNTER — Telehealth: Payer: Self-pay

## 2021-07-20 NOTE — Telephone Encounter (Signed)
Patient will call back to schedule June 3 m fu   ?

## 2021-07-20 NOTE — Telephone Encounter (Signed)
Nutrition ? ?Received phone message from daughter [name].  Thought that patient had a nutrition appointment today and requested call from RD.   ? ?Called and spoke with patient and RD did not have patient on schedule for today. Patient is scheduled for nutrition phone visit on Wed 4/12.  Patient agreeable to this.    ? ?Brewster Wolters B. Zenia Resides, RD, LDN ?Registered Dietitian ?[phone] ? ?

## 2021-07-24 ENCOUNTER — Inpatient Hospital Stay: Payer: Medicare Other | Attending: Oncology

## 2021-07-24 ENCOUNTER — Inpatient Hospital Stay: Payer: Medicare Other

## 2021-07-24 ENCOUNTER — Inpatient Hospital Stay (HOSPITAL_BASED_OUTPATIENT_CLINIC_OR_DEPARTMENT_OTHER): Payer: Medicare Other | Admitting: Oncology

## 2021-07-24 ENCOUNTER — Encounter: Payer: Self-pay | Admitting: Oncology

## 2021-07-24 VITALS — BP 140/73 | HR 78 | Temp 97.5°F | Resp 16 | Ht 62.0 in | Wt 126.6 lb

## 2021-07-24 DIAGNOSIS — E876 Hypokalemia: Secondary | ICD-10-CM | POA: Insufficient documentation

## 2021-07-24 DIAGNOSIS — E8809 Other disorders of plasma-protein metabolism, not elsewhere classified: Secondary | ICD-10-CM | POA: Diagnosis not present

## 2021-07-24 DIAGNOSIS — K862 Cyst of pancreas: Secondary | ICD-10-CM

## 2021-07-24 DIAGNOSIS — R7989 Other specified abnormal findings of blood chemistry: Secondary | ICD-10-CM | POA: Diagnosis not present

## 2021-07-24 DIAGNOSIS — Z79899 Other long term (current) drug therapy: Secondary | ICD-10-CM | POA: Diagnosis not present

## 2021-07-24 DIAGNOSIS — E859 Amyloidosis, unspecified: Secondary | ICD-10-CM | POA: Diagnosis present

## 2021-07-24 DIAGNOSIS — D500 Iron deficiency anemia secondary to blood loss (chronic): Secondary | ICD-10-CM | POA: Diagnosis not present

## 2021-07-24 DIAGNOSIS — S065XAA Traumatic subdural hemorrhage with loss of consciousness status unknown, initial encounter: Secondary | ICD-10-CM | POA: Diagnosis not present

## 2021-07-24 DIAGNOSIS — E8581 Light chain (AL) amyloidosis: Secondary | ICD-10-CM

## 2021-07-24 DIAGNOSIS — Z5111 Encounter for antineoplastic chemotherapy: Secondary | ICD-10-CM

## 2021-07-24 DIAGNOSIS — D631 Anemia in chronic kidney disease: Secondary | ICD-10-CM

## 2021-07-24 DIAGNOSIS — E119 Type 2 diabetes mellitus without complications: Secondary | ICD-10-CM | POA: Insufficient documentation

## 2021-07-24 DIAGNOSIS — R634 Abnormal weight loss: Secondary | ICD-10-CM

## 2021-07-24 LAB — CBC WITH DIFFERENTIAL/PLATELET
Abs Immature Granulocytes: 0.04 10*3/uL (ref 0.00–0.07)
Basophils Absolute: 0.1 10*3/uL (ref 0.0–0.1)
Basophils Relative: 2 %
Eosinophils Absolute: 0.1 10*3/uL (ref 0.0–0.5)
Eosinophils Relative: 2 %
HCT: 28.3 % — ABNORMAL LOW (ref 36.0–46.0)
Hemoglobin: 10 g/dL — ABNORMAL LOW (ref 12.0–15.0)
Immature Granulocytes: 1 %
Lymphocytes Relative: 21 %
Lymphs Abs: 1.3 10*3/uL (ref 0.7–4.0)
MCH: 27.6 pg (ref 26.0–34.0)
MCHC: 35.3 g/dL (ref 30.0–36.0)
MCV: 78.2 fL — ABNORMAL LOW (ref 80.0–100.0)
Monocytes Absolute: 0.7 10*3/uL (ref 0.1–1.0)
Monocytes Relative: 13 %
Neutro Abs: 3.7 10*3/uL (ref 1.7–7.7)
Neutrophils Relative %: 61 %
Platelets: 433 10*3/uL — ABNORMAL HIGH (ref 150–400)
RBC: 3.62 MIL/uL — ABNORMAL LOW (ref 3.87–5.11)
RDW: 24.4 % — ABNORMAL HIGH (ref 11.5–15.5)
WBC: 5.9 10*3/uL (ref 4.0–10.5)
nRBC: 0 % (ref 0.0–0.2)

## 2021-07-24 LAB — COMPREHENSIVE METABOLIC PANEL
ALT: 33 U/L (ref 0–44)
AST: 37 U/L (ref 15–41)
Albumin: 2.5 g/dL — ABNORMAL LOW (ref 3.5–5.0)
Alkaline Phosphatase: 905 U/L — ABNORMAL HIGH (ref 38–126)
Anion gap: 6 (ref 5–15)
BUN: 26 mg/dL — ABNORMAL HIGH (ref 8–23)
CO2: 18 mmol/L — ABNORMAL LOW (ref 22–32)
Calcium: 8.5 mg/dL — ABNORMAL LOW (ref 8.9–10.3)
Chloride: 112 mmol/L — ABNORMAL HIGH (ref 98–111)
Creatinine, Ser: 0.9 mg/dL (ref 0.44–1.00)
GFR, Estimated: >60 mL/min (ref >60)
Glucose, Bld: 213 mg/dL — ABNORMAL HIGH (ref 70–99)
Potassium: 3.8 mmol/L (ref 3.5–5.1)
Sodium: 136 mmol/L (ref 135–145)
Total Bilirubin: 4.9 mg/dL — ABNORMAL HIGH (ref 0.3–1.2)
Total Protein: 5.5 g/dL — ABNORMAL LOW (ref 6.5–8.1)

## 2021-07-24 MED ORDER — DIPHENHYDRAMINE HCL 25 MG PO CAPS
50.0000 mg | ORAL_CAPSULE | Freq: Once | ORAL | Status: AC
  Administered 2021-07-24: 50 mg via ORAL
  Filled 2021-07-24: qty 2

## 2021-07-24 MED ORDER — DIPHENHYDRAMINE HCL 25 MG PO CAPS: 50.0000 mg | ORAL_CAPSULE | Freq: Once | ORAL | Status: DC

## 2021-07-24 MED ORDER — ACETAMINOPHEN 325 MG PO TABS
ORAL_TABLET | ORAL | Status: AC
  Filled 2021-07-24: qty 1

## 2021-07-24 MED ORDER — DEXAMETHASONE 4 MG PO TABS: 20.0000 mg | ORAL_TABLET | Freq: Once | ORAL | Status: DC

## 2021-07-24 MED ORDER — ACETAMINOPHEN 325 MG PO TABS: 325.0000 mg | ORAL_TABLET | Freq: Once | ORAL | Status: DC

## 2021-07-24 MED ORDER — DEXAMETHASONE 4 MG PO TABS
20.0000 mg | ORAL_TABLET | Freq: Once | ORAL | Status: AC
  Administered 2021-07-24: 20 mg via ORAL
  Filled 2021-07-24: qty 5

## 2021-07-24 MED ORDER — ACETAMINOPHEN 325 MG PO TABS
325.0000 mg | ORAL_TABLET | Freq: Once | ORAL | Status: AC
  Administered 2021-07-24: 325 mg via ORAL

## 2021-07-24 MED ORDER — DARATUMUMAB-HYALURONIDASE-FIHJ 1800-30000 MG-UT/15ML ~~LOC~~ SOLN
1800.0000 mg | Freq: Once | SUBCUTANEOUS | Status: AC
  Administered 2021-07-24: 1800 mg via SUBCUTANEOUS
  Filled 2021-07-24: qty 15

## 2021-07-24 MED ORDER — DARATUMUMAB-HYALURONIDASE-FIHJ 1800-30000 MG-UT/15ML ~~LOC~~ SOLN
1800.0000 mg | Freq: Once | SUBCUTANEOUS | Status: DC
  Filled 2021-07-24: qty 15

## 2021-07-24 NOTE — Patient Instructions (Signed)
[facility] CANCER CTR AT [place]  Discharge Instructions: ?Thank you for choosing [facility] to provide your oncology and hematology care.  ?If you have a lab appointment with the [facility], please go directly to the [facility] and check in at the registration area. ? ?Wear comfortable clothing and clothing appropriate for easy access to any Portacath or PICC line.  ? ?We strive to give you quality time with your provider. You may need to reschedule your appointment if you arrive late (15 or more minutes).  Arriving late affects you and other patients whose appointments are after yours.  Also, if you miss three or more appointments without notifying the office, you may be dismissed from the clinic at the provider?s discretion.    ?  ?For prescription refill requests, have your pharmacy contact our office and allow 72 hours for refills to be completed.   ? ?Today you received the following chemotherapy and/or immunotherapy agents :daratumumab  ?  ?To help prevent nausea and vomiting after your treatment, we encourage you to take your nausea medication as directed. ? ?BELOW ARE SYMPTOMS THAT SHOULD BE REPORTED IMMEDIATELY: ?*FEVER GREATER THAN 100.4 F (38 ?C) OR HIGHER ?*CHILLS OR SWEATING ?*NAUSEA AND VOMITING THAT IS NOT CONTROLLED WITH YOUR NAUSEA MEDICATION ?*UNUSUAL SHORTNESS OF BREATH ?*UNUSUAL BRUISING OR BLEEDING ?*URINARY PROBLEMS (pain or burning when urinating, or frequent urination) ?*BOWEL PROBLEMS (unusual diarrhea, constipation, pain near the anus) ?TENDERNESS IN MOUTH AND THROAT WITH OR WITHOUT PRESENCE OF ULCERS (sore throat, sores in mouth, or a toothache) ?UNUSUAL RASH, SWELLING OR PAIN  ?UNUSUAL VAGINAL DISCHARGE OR ITCHING  ? ?Items with * indicate a potential emergency and should be followed up as soon as possible or go to the Emergency Department if any problems should occur. ? ?Please show the CHEMOTHERAPY ALERT CARD or IMMUNOTHERAPY ALERT CARD at check-in to  the Emergency Department and triage nurse. ? ?Should you have questions after your visit or need to cancel or reschedule your appointment, please contact [facility] CANCER [place] AT [place]  [phone] and follow the prompts.  Office hours are 8:00 a.m. to 4:30 p.m. Monday - Friday. Please note that voicemails left after 4:00 p.m. may not be returned until the following business day.  We are closed weekends and major holidays. You have access to a nurse at all times for urgent questions. Please call the main number to the clinic [phone] and follow the prompts. ? ?For any non-urgent questions, you may also contact your provider using MyChart. We now offer e-Visits for anyone 55 and older to request care online for non-urgent symptoms. For details visit [email or web address]. ?  ?Also download the MyChart app! Go to the app store, search "MyChart", open the app, select [facility], and log in with your MyChart username and password. ? ?Due to Covid, a mask is required upon entering the hospital/clinic. If you do not have a mask, one will be given to you upon arrival. For doctor visits, patients may have 1 support person aged 28 or older with them. For treatment visits, patients cannot have anyone with them due to current Covid guidelines and our immunocompromised population.  ?

## 2021-07-24 NOTE — Progress Notes (Signed)
?Hematology/Oncology Progress note ?[phone] [phone] ?  ? ? ? ?Patient Care Team: ?Nancy Ramsay, MD as PCP - General (Infectious Diseases) ?Nancy [place], MD as PCP - Cardiology (Cardiology) ?Nancy Server, MD as Consulting Physician (Oncology) ? ?REFERRING PROVIDER: ?Nancy Ramsay, MD  ?CHIEF COMPLAINTS/REASON FOR VISIT:  ?Follow-up for amyloidosis ? ?HISTORY OF PRESENTING ILLNESS:  ? ?Nancy Rodriguez is a  76 y.o.  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 ?Reviewed her previous medical records via care everywhere.  ?She was seen by Hematology Oncology at [facility] on 02/14/2017.  ?07/25/2007  SPEP showed M protein of 0.35, IFE showed IgG lamda ?09/22/2007  Bone survey negative.  ?02/15/14 IgG 930, SPEP M protien 0.22, free kappa light chain 3.59, lamda 2.92, ratio 1.23 ? ?Hypercalcemia, resolved after stopping HCTZ. ? ?# Transaminitis 03/28/20 US abdomen showed left liver lobe Complex 1.7 cm cyst  ?# 04/07/20 MRI abdomen w/wo contrast showed 2 benign liver cysts, 2 nonspecific hypovascular irighr liver lesions, abnormal bone marrow signal of L1 vertebral body. Patient was  advised to proceed with bone marrow biopsy and she declined.  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  Patient changed her mind and ask a biopsy to be canceled.  My team and I had multiple phone discussion with patient's daughter Nancy Rodriguez and later with the patient's son Nancy Rodriguez.  Patient agreed with bone marrow biopsy and a biopsy was scheduled on 06/09/2020. ? ?05/20/20 NT proBNP  was normal,  ?TSH slightly increased, normal T4 ?Normal factor X ?Normal coags ?Troponin 19. Refer to cardiology for  evaluation. May need cardiac MRI ? ?# 06/01/2020 2D echo result was reviewed and discussed with patient.  Patient has normal LVEF 60-65%. ?Mild asymmetric left ventricular hypertrophy.  Left ventricular diastolic parameters are indeterminate. ?average left ventricular global longitudinal strain is -16.8 %. ? ?# 06/09/2020, bone marrow biopsy showed monoclonal plasmacytosis, 8%, amyloid deposit present.  Absent iron stores. 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 t (14;20) ?#06/17/2020, further discussed about diagnosis and treatment plan.  Patient declined bone marrow transplant evaluation.  Decision was made to proceed with Dara-CyborD chemotherapy treatments. ?#May 2022, second opinion at [facility] with Nancy Rodriguez.who agrees with current treatment plan.  He recommends to lower dexamethasone to 20 mg and decrease premed Tylenol to 325 mg ? ?09/01/2020 cardiac MRI was performed at Nancy Rodriguez.  Left ventricle is normal in size.  Mild to moderate basal septal hypertrophy.  Hyper dynamic LV function.  LVEF 71%.  Right ventricle is normal in size and wall thickness.  Systolic function normal.  Mild bi atrial enlargement, no significant aortic valve stenosis or regurgitation.  Trivial mitral regurgitation, mild tricuspid regurgitation and a trivial pulmonic regurgitation.  No evidence of MI, scarring or infiltration. ? ?#History of major depression listed in outside problem list.  ?previous diagnosis and treatment details are not available to me in EMR  ?I have referred her to psychiatrist ? ?#  vaginal bleeding which stopped- she was seen by Nancy Rodriguez and recommended for biopsy.  ? ?# She has poor IV access and was not able to receive IV Emend and IV Aloxi.  She does not want to have med port.  ? ?# GERD   Nancy Rodriguez has increased her omeprazole to 40 mg daily. She feels symptoms are improved.  ?# 10/08/2020, cervical spine showed cervical spondylosis.  Spinal canal diagnosis and a posterior  disc osteophyte complex and superimposed small central disc protrusion contact the ventral spinal cord at C4-C5.  Multilevel neuroforaminal narrowing. bilateral atlantooccipital joint effusions, multilevel grade 1 spondylolisthesis. ?MRI lumbar spine without contrast showed acute or subacute fracture of L4.   Moderate spinal stenosis at L3-4 has progressed. Shallow left foraminal disc protrusion has progressed. Moderate subarticular stenosis on the left also with progression  Moderate spinal stenosis L4-5 with progression. Moderate subarticular stenosis on the left with progression ? ?# 10/25/2020 seen by Nancy Rodriguez who recommend patient to switch Dara CyBorD to Brewster RD ? ?# 11/03/2020 Revlimid (lenalidomide) 10 mg by mouth daily for 14 days, then hold for 7 days. Pt was started on a shortened cycle in order to remain on similar schedule with infusion.  ? ?# 11/22/2020 seen by hepatologist Nancy Rodriguez. Recommended to start Zoloft 90m daily for pruritis ?# 11/24/2020 cycle 2 Revlimid 10 mg, she finished 14 days.  Rest of the course was held due to anemia.  Patient declined blood transfusion. ? ?# Vaginal bleeding ? patient was seen by Nancy Rodriguez  on megace 462mdaily. 12/22/2020 repeat EMBx - not diagnostic due to insufficient squamous component.  ? ?03/03/2021, ultrasound abdomen complete showed possible hypoechoic mass in the pancreatic tail.  1.8 x 1.2 x 1.4 cm.  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  Cholelithiasis.  MRI has been ordered for further evaluation and is scheduled. ? ?+ Falls and [facility] Patient was found to have marked hyperglycemia, AKI, UTI. ?05/03/2021, CT head showed chronic appearing subdural hematoma, measuring up to 1.6 on the left and 0.9 on the right. ?05/18/2021, patient was seen by cardiology Nancy Rodriguez, patient was recommended to continue torsemide 20 mg daily as needed for edema.  Patient was recommended to discontinue aspirin 81 mg  due to frequent falls and chronic subdural hematoma.  Patient has not had further falls.  She has home physical therapy ? ? ?INTERVAL HISTORY ?ShODDIE KUHLMANNs a [age].o. female who has above history reviewed by me today presents for follow up visit for management of AL amyloidosis ?Patient was accompanied by her daughter today. ?+ Fatigue, itchiness ? ? ?Review of Systems  ?Constitutional:  Positive for fatigue. Negative for appetite change, chills, fever and unexpected weight change.  ?HENT:   Negative for hearing loss, nosebleeds and voice change.   ?Eyes:  Positive for icterus. Negative for eye problems.  ?Respiratory:  Negative for chest tightness and cough.   ?Cardiovascular:  Positive for leg swelling. Negative for chest pain.  ?Gastrointestinal:  Negative for abdominal distention, abdominal pain, blood in stool and nausea.  ?Endocrine: Negative for hot flashes.  ?Genitourinary:  Negative for difficulty urinating and frequency.   ?Musculoskeletal:  Positive for back pain. Negative for arthralgias.  ?Skin:  Positive for itching. Negative for rash.  ?     jaundice  ?Neurological:  Positive for numbness. Negative for extremity weakness.  ?Hematological:  Negative for adenopathy.  ?Psychiatric/Behavioral:  Negative for confusion.   ? ?MEDICAL HISTORY:  ?Past Medical History:  ?Diagnosis Date  ? (HFpEF) heart failure with preserved ejection fraction ([facility]  ? a. 05/2020 Echo: EF 60-65%; b. 08/2020 cMRI (Nancy Rodriguez): EF 71%, no  delayed hyperenhancement to suggest scar/infiltrative dzs. Nl RV fxn. Mild BAE. Triv MR, mild TR; c. 06/2021 Echo: EF 50-55%, no rwma, mild basal-septal LVH, GrII DD. Nl RV size/fxn. Mild MR. Ao sclerosis.  ? 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)   ? Anemia due to chronic kidney disease   ? Anemia in chronic kidney disease  ? CKD (chronic kidney disease), stage III ([place])   ? Diabetes mellitus without complication ([place])   ? History of cardiovascular stress test   ? a. 03/2012 Stress Echo(Nancy Rodriguez): nl study.  ?  Hypercholesterolemia   ? Hypertension   ? MGUS (monoclonal gammopathy of unknown significance)   ? Osteoarthritis   ? Rheumatoid arthritis ([place])   ? ? ?SURGICAL HISTORY: ?Past Surgical History:  ?Procedure Laterality D

## 2021-07-24 NOTE — Progress Notes (Signed)
Patient tolerated daratumumab injection well today, no concerns voiced. Patient stable at discharge. AVS given.   ?

## 2021-07-25 ENCOUNTER — Telehealth: Payer: Self-pay | Admitting: Nurse Practitioner

## 2021-07-25 ENCOUNTER — Telehealth: Payer: Medicare Other

## 2021-07-25 LAB — KAPPA/LAMBDA LIGHT CHAINS
Kappa free light chain: 15.7 mg/L (ref 3.3–19.4)
Kappa, lambda light chain ratio: 1.78 — ABNORMAL HIGH (ref 0.26–1.65)
Lambda free light chains: 8.8 mg/L (ref 5.7–26.3)

## 2021-07-25 LAB — BETA 2 MICROGLOBULIN, SERUM: Beta-2 Microglobulin: 3.2 mg/L — ABNORMAL HIGH (ref 0.6–2.4)

## 2021-07-25 NOTE — Telephone Encounter (Signed)
I called Nancy Rodriguez to schedule PC f/u visit, requested I called her daughter, [name]. I called [name], left message to re-conntact to schedule f/u visit with contact information  ?

## 2021-07-26 ENCOUNTER — Inpatient Hospital Stay: Payer: Medicare Other

## 2021-07-26 LAB — MULTIPLE MYELOMA PANEL, SERUM
Albumin SerPl Elph-Mcnc: 2.3 g/dL — ABNORMAL LOW (ref 2.9–4.4)
Albumin/Glob SerPl: 1.1 (ref 0.7–1.7)
Alpha 1: 0.3 g/dL (ref 0.0–0.4)
Alpha2 Glob SerPl Elph-Mcnc: 0.7 g/dL (ref 0.4–1.0)
B-Globulin SerPl Elph-Mcnc: 0.9 g/dL (ref 0.7–1.3)
Gamma Glob SerPl Elph-Mcnc: 0.3 g/dL — ABNORMAL LOW (ref 0.4–1.8)
Globulin, Total: 2.3 g/dL (ref 2.2–3.9)
IgA: 107 mg/dL (ref 64–422)
IgG (Immunoglobin G), Serum: 421 mg/dL — ABNORMAL LOW (ref 586–1602)
IgM (Immunoglobulin M), Srm: 59 mg/dL (ref 26–217)
Total Protein ELP: 4.6 g/dL — ABNORMAL LOW (ref 6.0–8.5)

## 2021-07-26 NOTE — Progress Notes (Signed)
Nutrition Follow-up: ? ?Patient with AL lambda amylodosis.  Patient on darazalex, revlimid.   ? ?Spoke with patient via phone for telephone visit.  Patient reports that her appetite is up and down, some days she eats better than others.  Can't remember what she ate yesterday.  Says that she is drinking 2 glucerna shakes each day.  Also says that her daughter is making sure she takes her DM medication.  Denies nausea.  Says that she has loose stool but not diarrhea.  ? ? ? ?Medications: reviewed ? ?Labs: reviewed ? ?Anthropometrics:  ? ?Weight 126 lb 9.6 oz on 4/10 ?124 lb on 3/10 ?119 lb on 2/14 ?132 lb on 1/12 (edema both legs) ?139 lb on 11/17 (edema present) ?129 lb on 9/9 ?142 lb on 5/25 ?147 lb on 05/11/20 ? ? ?NUTRITION DIAGNOSIS: Unintentional weight loss stable ? ? ?INTERVENTION:  ?Continue glucerna shakes BID ?Patient to continue eating well balanced diet including good sources of protein ?Continue to take DM medication as prescribed and keep log of blood sugars.  ?  ? ?MONITORING, EVALUATION, GOAL: weight trends, intake ? ? ?NEXT VISIT: telephone Wednesday, May 17 ? ?Courtlynn Holloman B. Zenia Resides, RD, LDN ?Registered Dietitian ?[phone] ? ? ?

## 2021-07-31 ENCOUNTER — Other Ambulatory Visit: Payer: Self-pay | Admitting: Oncology

## 2021-07-31 NOTE — Telephone Encounter (Signed)
Component Ref Range & Units 7 d ago ?(07/24/21) 1 mo ago ?(06/23/21) 2 mo ago ?(05/29/21) 3 mo ago ?(04/27/21) 4 mo ago ?(03/30/21) 5 mo ago ?(03/02/21) 5 mo ago ?(02/16/21)  ?Potassium 3.5 - 5.1 mmol/L 3.8  4.1  3.5  3.1 Low   3.8  4.1  4.5   ? ?

## 2021-08-01 ENCOUNTER — Encounter: Payer: Self-pay | Admitting: Oncology

## 2021-08-01 DIAGNOSIS — R296 Repeated falls: Secondary | ICD-10-CM | POA: Diagnosis not present

## 2021-08-01 DIAGNOSIS — E119 Type 2 diabetes mellitus without complications: Secondary | ICD-10-CM | POA: Diagnosis not present

## 2021-08-01 DIAGNOSIS — E859 Amyloidosis, unspecified: Secondary | ICD-10-CM

## 2021-08-01 DIAGNOSIS — M6281 Muscle weakness (generalized): Secondary | ICD-10-CM | POA: Diagnosis present

## 2021-08-01 LAB — GLUCOSE, CAPILLARY
Glucose-Capillary: 168 mg/dL — ABNORMAL HIGH (ref 70–99)
Glucose-Capillary: 180 mg/dL — ABNORMAL HIGH (ref 70–99)

## 2021-08-01 NOTE — Progress Notes (Signed)
Daily Session Note ? ?Patient Details  ?Name: Nancy Rodriguez ?MRN: [id number] ?Date of Birth: 1946/03/27 ?Referring Provider:   ?Flowsheet Row Cancer Associated Rehabilitation & Exercise from 07/18/2021 in [facility] Cardiac and Pulmonary Rehab  ?Referring Provider [name] MD  ? ?  ? ? ?Encounter Date: 08/01/2021 ? ?Check In: ? Session Check In - 08/01/21 1347   ? ?  ? Check-In  ? Supervising physician immediately available to respond to emergencies See telemetry face sheet for immediately available ER MD   ? Location [facility]-Cardiac & Pulmonary Rehab   ? Staff Present Coralie Keens, MS, ASCM CEP, Exercise Physiologist;Jessica Luan Pulling, MA, RCEP, CCRP, Kathaleen Maser, MPA, RN   ? Virtual Visit No   ? Medication changes reported     No   ? Fall or balance concerns reported    No   ? Warm-up and Cool-down Performed on first and last piece of equipment   ? Resistance Training Performed Yes   ? VAD Patient? No   ? PAD/SET Patient? No   ? ?  ?  ? ?  ? ? ? ? ? ?Social History  ? ?Tobacco Use  ?Smoking Status Never  ?Smokeless Tobacco Never  ? ? ?Goals Met:  ?Proper associated with RPD/PD & O2 Sat ?Independence with exercise equipment ?Exercise tolerated well ?No report of concerns or symptoms today ?Strength training completed today ? ?Goals Unmet:  ?Not Applicable ? ?Comments: First full day of exercise!  Patient was oriented to gym and equipment including functions, settings, policies, and procedures.  Patient's individual exercise prescription and treatment plan were reviewed.  All starting workloads were established based on the results of the 6 minute walk test done at initial orientation visit.  The plan for exercise progression was also introduced and progression will be customized based on patient's performance and goals.  ? ? ?Dr. Emily Filbert is Medical Director for [facility].  ?Dr. Ottie Glazier is Medical Director for [facility] Pulmonary [facility]. ?

## 2021-08-03 DIAGNOSIS — E859 Amyloidosis, unspecified: Secondary | ICD-10-CM

## 2021-08-03 DIAGNOSIS — M6281 Muscle weakness (generalized): Secondary | ICD-10-CM | POA: Diagnosis not present

## 2021-08-03 LAB — GLUCOSE, CAPILLARY
Glucose-Capillary: 101 mg/dL — ABNORMAL HIGH (ref 70–99)
Glucose-Capillary: 96 mg/dL (ref 70–99)

## 2021-08-03 NOTE — Progress Notes (Signed)
Daily Session Note ? ?Patient Details  ?Name: Nancy Rodriguez ?MRN: [id number] ?Date of Birth: 1946/04/04 ?Referring Provider:   ?Flowsheet Row Cancer Associated Rehabilitation & Exercise from 07/18/2021 in [facility] Cardiac and Pulmonary Rehab  ?Referring Provider [name] MD  ? ?  ? ? ?Encounter Date: 08/03/2021 ? ?Check In: ? Session Check In - 08/03/21 1228   ? ?  ? Check-In  ? Supervising physician immediately available to respond to emergencies See telemetry face sheet for immediately available ER MD   ? Location [facility] Re[facility]   ? Staff Present Birdie Sons, MPA, Nino Glow, MS, ASCM CEP, Exercise Physiologist;Amanda Oletta Darter, BA, ACSM CEP, Exercise Physiologist   ? Virtual Visit No   ? Medication changes reported     No   ? Fall or balance concerns reported    No   ? Warm-up and Cool-down Performed as group-led instruction   ? Resistance Training Performed No   yoga  ? VAD Patient? No   ? PAD/SET Patient? No   ?  ? Pain Assessment  ? Currently in Pain? No/denies   ? ?  ?  ? ?  ? ? ? ? ? ?Social History  ? ?Tobacco Use  ?Smoking Status Never  ?Smokeless Tobacco Never  ? ? ?Goals Met:  ?Independence with exercise equipment ?Exercise tolerated well ?No report of concerns or symptoms today ?Strength training completed today ? ?Goals Unmet:  ?Not Applicable ? ?Comments: Pt able to follow exercise prescription today without complaint.  Will continue to monitor for progression. ? ?Yoga completed today by Nada Maclachlan, Exercise Physiologist. Patient watched but did not participate. Blood sugar leaving was 96. Patient reported headache. All vitals stable. Daughter was informed and will bring home to eat something and re-check sugar to monitor. ? ?Dr. Emily Filbert is Medical Director for [place].  ?Dr. Ottie Glazier is Medical Director for [facility] Pulmonary Rehabilitation. ?

## 2021-08-10 DIAGNOSIS — E859 Amyloidosis, unspecified: Secondary | ICD-10-CM

## 2021-08-10 DIAGNOSIS — M6281 Muscle weakness (generalized): Secondary | ICD-10-CM | POA: Diagnosis not present

## 2021-08-10 LAB — GLUCOSE, CAPILLARY
Glucose-Capillary: 193 mg/dL — ABNORMAL HIGH (ref 70–99)
Glucose-Capillary: 184 mg/dL — ABNORMAL HIGH (ref 70–99)

## 2021-08-10 NOTE — Progress Notes (Signed)
Daily Session Note ? ?Patient Details  ?Name: Nancy Rodriguez ?MRN: [id number] ?Date of Birth: December 28, 1945 ?Referring Provider:   ?Flowsheet Row Cancer Associated Rehabilitation & Exercise from 07/18/2021 in [facility] Cardiac and Pulmonary Rehab  ?Referring Provider [name] MD  ? ?  ? ? ?Encounter Date: 08/10/2021 ? ?Check In: ? Session Check In - 08/10/21 1208   ? ?  ? Check-In  ? Supervising physician immediately available to respond to emergencies See telemetry face sheet for immediately available ER MD   ? Location [facility]-Card[facility] & Pulmonary Rehab   ? Staff Present Birdie Sons, MPA, RN;Jessica [place], MA, RCEP, CCRP, Marylynn Pearson, MS, ASCM CEP, Exercise Physiologist   ? Virtual Visit No   ? Medication changes reported     No   ? Fall or balance concerns reported    No   ? Warm-up and Cool-down Performed on first and last piece of equipment   ? Resistance Training Performed Yes   ? VAD Patient? No   ? PAD/SET Patient? No   ?  ? Pain Assessment  ? Currently in Pain? No/denies   ? ?  ?  ? ?  ? ? ? ? ? ?Social History  ? ?Tobacco Use  ?Smoking Status Never  ?Smokeless Tobacco Never  ? ? ?Goals Met:  ?Independence with exercise equipment ?Exercise tolerated well ?No report of concerns or symptoms today ?Strength training completed today ? ?Goals Unmet:  ?Not Applicable ? ?Comments: Pt able to follow exercise prescription today without complaint.  Will continue to monitor for progression. ? ? ? ?Dr. Emily Filbert is Medical Director for [facility].  ?Dr. Ottie Glazier is Medical Director for [facility] Pulmonary Rehabilitation. ?

## 2021-08-11 ENCOUNTER — Other Ambulatory Visit: Payer: Self-pay | Admitting: *Deleted

## 2021-08-11 DIAGNOSIS — E8581 Light chain (AL) amyloidosis: Secondary | ICD-10-CM

## 2021-08-11 MED ORDER — LENALIDOMIDE 10 MG PO CAPS: ORAL_CAPSULE | ORAL | 0 refills | Status: DC

## 2021-08-14 NOTE — Telephone Encounter (Signed)
Due for End in July patient not ready to schedule.  ? ?Needs fu  ?

## 2021-08-14 NOTE — Telephone Encounter (Signed)
Attempted to schedule.  

## 2021-08-15 ENCOUNTER — Encounter: Payer: Medicare Other | Attending: Oncology

## 2021-08-15 DIAGNOSIS — E859 Amyloidosis, unspecified: Secondary | ICD-10-CM | POA: Insufficient documentation

## 2021-08-15 NOTE — Progress Notes (Signed)
Daily Session Note ? ?Patient Details  ?Name: Nancy Rodriguez ?MRN: [id number] ?Date of Birth: 02-19-1946 ?Referring Provider:   ?Flowsheet Row Cancer Associated Rehabilitation & Exercise from 07/18/2021 in [facility] Cardiac and Pulmonary Rehab  ?Referring Provider [name] MD  ? ?  ? ? ?Encounter Date: 08/15/2021 ? ?Check In: ? Session Check In - 08/15/21 1221   ? ?  ? Check-In  ? Supervising physician immediately available to respond to emergencies See telemetry face sheet for immediately available ER MD   ? Location [facility]-Cardiac & Pulmonary Rehab   ? Staff Present Birdie Sons, MPA, RN;Jessica [place], MA, RCEP, CCRP, Marylynn Pearson, MS, ASCM CEP, Exercise Physiologist   ? Virtual Visit No   ? Medication changes reported     No   ? Fall or balance concerns reported    No   ? Warm-up and Cool-down Performed on first and last piece of equipment   ? Resistance Training Performed Yes   ? VAD Patient? No   ? PAD/SET Patient? No   ?  ? Pain Assessment  ? Currently in Pain? No/denies   ? ?  ?  ? ?  ? ? ? ? ? ?Social History  ? ?Tobacco Use  ?Smoking Status Never  ?Smokeless Tobacco Never  ? ? ?Goals Met:  ?Independence with exercise equipment ?Exercise tolerated well ?No report of concerns or symptoms today ?Strength training completed today ? ?Goals Unmet:  ?Not Applicable ? ?Comments: Pt able to follow exercise prescription today without complaint.  Will continue to monitor for progression. ? ? ? ?Dr. Emily Filbert is Medical Director for [place].  ?Dr. Ottie Glazier is Medical Director for [facility] Pulmonary Rehabilitation. ?

## 2021-08-17 DIAGNOSIS — E859 Amyloidosis, unspecified: Secondary | ICD-10-CM

## 2021-08-17 NOTE — Progress Notes (Signed)
Daily Session Note ? ?Patient Details  ?Name: Nancy Rodriguez ?MRN: [id number] ?Date of Birth: [birth year] ?Referring Provider:   ?Flowsheet Row Cancer Associated Rehabilitation & Exercise from 07/18/2021 in [name] Cardiac and Pulmonary Rehab  ?Referring Provider [name] MD  ? ?  ? ? ?Encounter Date: 08/17/2021 ? ?Check In: ? Session Check In - 08/17/21 1238   ? ?  ? Check-In  ? Supervising physician immediately available to respond to emergencies See telemetry face sheet for immediately available ER MD   ? Location [facility]-Cardiac & Pulmonary Rehab   ? Staff Present Birdie Sons, MPA, RN;Melissa Caiola, RDN, LDN;Jessica Hawkins, MA, RCEP, CCRP, CCET   ? Virtual Visit No   ? Medication changes reported     No   ? Fall or balance concerns reported    No   ? Warm-up and Cool-down Performed on first and last piece of equipment   ? Resistance Training Performed Yes   bands  ? VAD Patient? No   ? PAD/SET Patient? No   ?  ? Pain Assessment  ? Currently in Pain? No/denies   ? ?  ?  ? ?  ? ? ? ? ? ?Social History  ? ?Tobacco Use  ?Smoking Status Never  ?Smokeless Tobacco Never  ? ? ?Goals Met:  ?Independence with exercise equipment ?Exercise tolerated well ?No report of concerns or symptoms today ?Strength training completed today ? ?Goals Unmet:  ?Not Applicable ? ?Comments: Pt able to follow exercise prescription today without complaint.  Will continue to monitor for progression. ? ? ? ?Dr. Emily Filbert is Medical Director for [place].  ?Dr. Ottie Glazier is Medical Director for [facility] Pulmonary Rehabilitation. ?

## 2021-08-19 ENCOUNTER — Ambulatory Visit: Payer: Self-pay

## 2021-08-21 ENCOUNTER — Inpatient Hospital Stay: Payer: Medicare Other | Attending: Oncology

## 2021-08-21 ENCOUNTER — Encounter: Payer: Self-pay | Admitting: Oncology

## 2021-08-21 ENCOUNTER — Inpatient Hospital Stay: Payer: Medicare Other

## 2021-08-21 ENCOUNTER — Inpatient Hospital Stay (HOSPITAL_BASED_OUTPATIENT_CLINIC_OR_DEPARTMENT_OTHER): Payer: Medicare Other | Admitting: Oncology

## 2021-08-21 VITALS — BP 146/116 | HR 107 | Temp 96.4°F | Wt 126.3 lb

## 2021-08-21 DIAGNOSIS — Z7952 Long term (current) use of systemic steroids: Secondary | ICD-10-CM | POA: Diagnosis not present

## 2021-08-21 DIAGNOSIS — D631 Anemia in chronic kidney disease: Secondary | ICD-10-CM

## 2021-08-21 DIAGNOSIS — N1831 Chronic kidney disease, stage 3a: Secondary | ICD-10-CM | POA: Insufficient documentation

## 2021-08-21 DIAGNOSIS — E1122 Type 2 diabetes mellitus with diabetic chronic kidney disease: Secondary | ICD-10-CM | POA: Diagnosis not present

## 2021-08-21 DIAGNOSIS — R000 Tachycardia, unspecified: Secondary | ICD-10-CM | POA: Diagnosis not present

## 2021-08-21 DIAGNOSIS — K77 Liver disorders in diseases classified elsewhere: Secondary | ICD-10-CM

## 2021-08-21 DIAGNOSIS — S065XAA Traumatic subdural hemorrhage with loss of consciousness status unknown, initial encounter: Secondary | ICD-10-CM | POA: Diagnosis not present

## 2021-08-21 DIAGNOSIS — Z5111 Encounter for antineoplastic chemotherapy: Secondary | ICD-10-CM

## 2021-08-21 DIAGNOSIS — Z79899 Other long term (current) drug therapy: Secondary | ICD-10-CM | POA: Insufficient documentation

## 2021-08-21 DIAGNOSIS — E46 Unspecified protein-calorie malnutrition: Secondary | ICD-10-CM | POA: Insufficient documentation

## 2021-08-21 DIAGNOSIS — R7401 Elevation of levels of liver transaminase levels: Secondary | ICD-10-CM | POA: Insufficient documentation

## 2021-08-21 DIAGNOSIS — R197 Diarrhea, unspecified: Secondary | ICD-10-CM

## 2021-08-21 DIAGNOSIS — E8581 Light chain (AL) amyloidosis: Secondary | ICD-10-CM | POA: Insufficient documentation

## 2021-08-21 DIAGNOSIS — E8809 Other disorders of plasma-protein metabolism, not elsewhere classified: Secondary | ICD-10-CM | POA: Insufficient documentation

## 2021-08-21 DIAGNOSIS — E854 Organ-limited amyloidosis: Secondary | ICD-10-CM | POA: Insufficient documentation

## 2021-08-21 DIAGNOSIS — R7989 Other specified abnormal findings of blood chemistry: Secondary | ICD-10-CM

## 2021-08-21 DIAGNOSIS — K219 Gastro-esophageal reflux disease without esophagitis: Secondary | ICD-10-CM | POA: Insufficient documentation

## 2021-08-21 DIAGNOSIS — R5383 Other fatigue: Secondary | ICD-10-CM | POA: Insufficient documentation

## 2021-08-21 DIAGNOSIS — E876 Hypokalemia: Secondary | ICD-10-CM | POA: Insufficient documentation

## 2021-08-21 DIAGNOSIS — D500 Iron deficiency anemia secondary to blood loss (chronic): Secondary | ICD-10-CM | POA: Diagnosis not present

## 2021-08-21 DIAGNOSIS — R634 Abnormal weight loss: Secondary | ICD-10-CM

## 2021-08-21 LAB — CBC WITH DIFFERENTIAL/PLATELET
Abs Immature Granulocytes: 0.07 10*3/uL (ref 0.00–0.07)
Basophils Absolute: 0.1 10*3/uL (ref 0.0–0.1)
Basophils Relative: 1 %
Eosinophils Absolute: 0.2 10*3/uL (ref 0.0–0.5)
Eosinophils Relative: 3 %
HCT: 29.6 % — ABNORMAL LOW (ref 36.0–46.0)
Hemoglobin: 10.4 g/dL — ABNORMAL LOW (ref 12.0–15.0)
Immature Granulocytes: 1 %
Lymphocytes Relative: 18 %
Lymphs Abs: 1.2 10*3/uL (ref 0.7–4.0)
MCH: 27.2 pg (ref 26.0–34.0)
MCHC: 35.1 g/dL (ref 30.0–36.0)
MCV: 77.5 fL — ABNORMAL LOW (ref 80.0–100.0)
Monocytes Absolute: 0.8 10*3/uL (ref 0.1–1.0)
Monocytes Relative: 11 %
Neutro Abs: 4.6 10*3/uL (ref 1.7–7.7)
Neutrophils Relative %: 66 %
Platelets: 465 10*3/uL — ABNORMAL HIGH (ref 150–400)
RBC: 3.82 MIL/uL — ABNORMAL LOW (ref 3.87–5.11)
RDW: 24 % — ABNORMAL HIGH (ref 11.5–15.5)
WBC: 6.9 10*3/uL (ref 4.0–10.5)
nRBC: 0 % (ref 0.0–0.2)

## 2021-08-21 LAB — COMPREHENSIVE METABOLIC PANEL
ALT: 38 U/L (ref 0–44)
AST: 40 U/L (ref 15–41)
Albumin: 2.7 g/dL — ABNORMAL LOW (ref 3.5–5.0)
Alkaline Phosphatase: 852 U/L — ABNORMAL HIGH (ref 38–126)
Anion gap: 9 (ref 5–15)
BUN: 32 mg/dL — ABNORMAL HIGH (ref 8–23)
CO2: 18 mmol/L — ABNORMAL LOW (ref 22–32)
Calcium: 8.7 mg/dL — ABNORMAL LOW (ref 8.9–10.3)
Chloride: 112 mmol/L — ABNORMAL HIGH (ref 98–111)
Creatinine, Ser: 1.14 mg/dL — ABNORMAL HIGH (ref 0.44–1.00)
GFR, Estimated: 50 mL/min — ABNORMAL LOW (ref >60)
Glucose, Bld: 186 mg/dL — ABNORMAL HIGH (ref 70–99)
Potassium: 3.5 mmol/L (ref 3.5–5.1)
Sodium: 139 mmol/L (ref 135–145)
Total Bilirubin: 4.4 mg/dL — ABNORMAL HIGH (ref 0.3–1.2)
Total Protein: 5.6 g/dL — ABNORMAL LOW (ref 6.5–8.1)

## 2021-08-21 LAB — APTT: aPTT: 33 seconds (ref 24–36)

## 2021-08-21 LAB — PROTIME-INR
INR: 1.2 (ref 0.8–1.2)
Prothrombin Time: 15.4 seconds — ABNORMAL HIGH (ref 11.4–15.2)

## 2021-08-21 MED ORDER — SODIUM CHLORIDE 0.9 % IV SOLN
Freq: Once | INTRAVENOUS | Status: AC
  Filled 2021-08-21: qty 250

## 2021-08-21 NOTE — Progress Notes (Signed)
Patient here for follow up. Patient complains of abdominal pain and diarrhea. ?

## 2021-08-21 NOTE — Progress Notes (Signed)
?Hematology/Oncology Progress note ?[phone] [phone] ?  ? ? ? ?Patient Care Team: ?Nancy Ramsay, MD as PCP - General (Infectious Diseases) ?Nancy Bush, MD as PCP - Cardiology (Cardiology) ?Nancy Server, MD as Consulting Physician (Oncology) ? ?REFERRING PROVIDER: ?Nancy Ramsay, MD  ?CHIEF COMPLAINTS/REASON FOR VISIT:  ?Follow-up for amyloidosis ? ?HISTORY OF PRESENTING ILLNESS:  ? ?Nancy Rodriguez is a  76 y.o.  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 ?Reviewed her previous medical records via care everywhere.  ?She was seen by Hematology Oncology at [facility] on 02/14/2017.  ?07/25/2007  SPEP showed M protein of 0.35, IFE showed IgG lamda ?09/22/2007  Bone survey negative.  ?02/15/14 IgG 930, SPEP M protien 0.22, free kappa light chain 3.59, lamda 2.92, ratio 1.23 ? ?Hypercalcemia, resolved after stopping HCTZ. ? ?# Transaminitis 03/28/20 US abdomen showed left liver lobe Complex 1.7 cm cyst  ?# 04/07/20 MRI abdomen w/wo contrast showed 2 benign liver cysts, 2 nonspecific hypovascular irighr liver lesions, abnormal bone marrow signal of L1 vertebral body. Patient was  advised to proceed with bone marrow biopsy and she declined.  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  Patient changed her mind and ask a biopsy to be canceled.  My team and I had multiple phone discussion with patient's daughter Nancy Rodriguez and later with the patient's son Nancy Rodriguez.  Patient agreed with bone marrow biopsy and a biopsy was scheduled on 06/09/2020. ? ?05/20/20 NT proBNP  was normal,  ?TSH slightly increased, normal T4 ?Normal factor X ?Normal coags ?Troponin 19. Refer to cardiology for  evaluation. May need cardiac MRI ? ?# 06/01/2020 2D echo result was reviewed and discussed with patient.  Patient has normal LVEF 60-65%. ?Mild asymmetric left ventricular hypertrophy.  Left ventricular diastolic parameters are indeterminate. ?average left ventricular global longitudinal strain is -16.8 %. ? ?# 06/09/2020, bone marrow biopsy showed monoclonal plasmacytosis, 8%, amyloid deposit present.  Absent iron stores. 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 t (14;20) ?#06/17/2020, further discussed about diagnosis and treatment plan.  Patient declined bone marrow transplant evaluation.  Decision was made to proceed with Dara-CyborD chemotherapy treatments. ?#May 2022, second opinion at [place] with Nancy Rodriguez.who agrees with current treatment plan.  He recommends to lower dexamethasone to 20 mg and decrease premed Tylenol to 325 mg ? ?09/01/2020 cardiac MRI was performed at [facility].  Left ventricle is normal in size.  Mild to moderate basal septal hypertrophy.  Hyper dynamic LV function.  LVEF 71%.  Right ventricle is normal in size and wall thickness.  Systolic function normal.  Mild bi atrial enlargement, no significant aortic valve stenosis or regurgitation.  Trivial mitral regurgitation, mild tricuspid regurgitation and a trivial pulmonic regurgitation.  No evidence of MI, scarring or infiltration. ? ?#History of major depression listed in outside problem list.  ?previous diagnosis and treatment details are not available to me in EMR  ?I have referred her to psychiatrist ? ?#  vaginal bleeding which stopped- she was seen by GYN and recommended for biopsy.  ? ?# She has poor IV access and was not able to receive IV Emend and IV Aloxi.  She does not want to have med port.  ? ?# GERD   Nancy Rodriguez has increased her omeprazole to 40 mg daily. She feels symptoms are improved.  ?# 10/08/2020, cervical spine showed cervical spondylosis.  Spinal canal diagnosis and a posterior  disc osteophyte complex and superimposed small central disc protrusion contact the ventral spinal cord at C4-C5.  Multilevel neuroforaminal narrowing. bilateral atlantooccipital joint effusions, multilevel grade 1 spondylolisthesis. ?MRI lumbar spine without contrast showed acute or subacute fracture of L4.   Moderate spinal stenosis at L3-4 has progressed. Shallow left foraminal disc protrusion has progressed. Moderate subarticular stenosis on the left also with progression  Moderate spinal stenosis L4-5 with progression. Moderate subarticular stenosis on the left with progression ? ?# 10/25/2020 seen by Nancy Rodriguez who recommend patient to switch Dara CyBorD to [place] RD ? ?# 11/03/2020 Revlimid (lenalidomide) 10 mg by mouth daily for 14 days, then hold for 7 days. Pt was started on a shortened cycle in order to remain on similar schedule with infusion.  ? ?# 11/22/2020 seen by hepatologist Nancy Rodriguez. Recommended to start Zoloft $RemoveBefo'50mg'hSlwyBJMAYm$  daily for pruritis ?# 11/24/2020 cycle 2 Revlimid 10 mg, she finished 14 days.  Rest of the course was held due to anemia.  Patient declined blood transfusion. ? ?# Vaginal bleeding ? patient was seen by Nancy Rodriguez,  on megace $RemoveB'40mg'PsuzCOSe$  daily. 12/22/2020 repeat EMBx - not diagnostic due to insufficient squamous component.  ? ?03/03/2021, ultrasound abdomen complete showed possible hypoechoic mass in the pancreatic tail.  1.8 x 1.2 x 1.4 cm.  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  Cholelithiasis.  MRI has been ordered for further evaluation and is scheduled. ? ?+ Falls and presented to [facility].  Patient was found to have marked hyperglycemia, AKI, UTI. ?05/03/2021, CT head showed chronic appearing subdural hematoma, measuring up to 1.6 on the left and 0.9 on the right. ?05/18/2021, patient was seen by cardiology Nancy Rodriguez, patient was recommended to continue torsemide 20 mg daily as needed for edema.  Patient was recommended to discontinue aspirin 81 mg  due to frequent falls and chronic subdural hematoma.  Patient has not had further falls.  She has home physical therapy ? ? ?INTERVAL HISTORY ?Nancy Rodriguez is a 76 y.o. female who has above history reviewed by me today presents for follow up visit for management of AL amyloidosis ?Patient was accompanied by her daughter today. ?+ Fatigue, itchiness ?+ Diarrhea, new onset, average 2 loose bowel movements per day. ?+ Decreased appetite ?Vaginal spotting has resolved. ? ?Review of Systems  ?Constitutional:  Positive for fatigue. Negative for appetite change, chills, fever and unexpected weight change.  ?HENT:   Negative for hearing loss, nosebleeds and voice change.   ?Eyes:  Positive for icterus. Negative for eye problems.  ?Respiratory:  Negative for chest tightness and cough.   ?Cardiovascular:  Positive for leg swelling. Negative for chest pain.  ?Gastrointestinal:  Negative for abdominal distention, abdominal pain, blood in stool and nausea.  ?Endocrine: Negative for hot flashes.  ?Genitourinary:  Negative for difficulty urinating and frequency.   ?Musculoskeletal:  Positive for back pain. Negative for arthralgias.  ?Skin:  Positive for itching. Negative for rash.  ?     jaundice  ?Neurological:  Positive for numbness. Negative for extremity weakness.  ?Hematological:  Negative for adenopathy.  ?Psychiatric/Behavioral:  Negative for confusion.   ? ?MEDICAL HISTORY:  ?Past Medical History:  ?Diagnosis Date  ? (HFpEF) heart failure with preserved ejection  fraction ([place])   ? a. 05/2020 Echo: EF 60-65%; b. 08/2020 cMRI ([facility]): EF 71%, no delayed hyperenhancement to suggest scar/infiltrative dzs. Nl RV fxn. Mild BAE. Triv MR, mild TR; c. 06/2021 Echo: EF 50-55%, no rwma, mild basal-septal LVH, GrII DD. Nl RV size/fxn. Mild MR. Ao sclerosis.  ? 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 Anemia due to chronic kidney disease   ? Anemia in chronic kidney disease  ? CKD (chronic kidney disease), stage III ([place])   ? Diabetes mellitus  without complication ([place])   ? History of cardiovascular stress test   ? a. 03/2012 Stress Echo([facility]): nl study.  ? Hypercholesterolemia   ? Hypertension   ? MGUS (monoclonal gammopathy of unknown significance)   ? Os

## 2021-08-22 ENCOUNTER — Encounter: Payer: Medicare Other | Admitting: *Deleted

## 2021-08-22 ENCOUNTER — Other Ambulatory Visit: Payer: Self-pay

## 2021-08-22 DIAGNOSIS — R197 Diarrhea, unspecified: Secondary | ICD-10-CM

## 2021-08-22 DIAGNOSIS — E859 Amyloidosis, unspecified: Secondary | ICD-10-CM

## 2021-08-22 DIAGNOSIS — E8581 Light chain (AL) amyloidosis: Secondary | ICD-10-CM | POA: Diagnosis not present

## 2021-08-22 LAB — GASTROINTESTINAL PANEL BY PCR, STOOL (REPLACES STOOL CULTURE)

## 2021-08-22 LAB — C DIFFICILE QUICK SCREEN W PCR REFLEX
C Diff antigen: NEGATIVE
C Diff interpretation: NOT DETECTED
C Diff toxin: NEGATIVE

## 2021-08-22 LAB — KAPPA/LAMBDA LIGHT CHAINS
Kappa free light chain: 16.2 mg/L (ref 3.3–19.4)
Kappa, lambda light chain ratio: 1.72 — ABNORMAL HIGH (ref 0.26–1.65)
Lambda free light chains: 9.4 mg/L (ref 5.7–26.3)

## 2021-08-22 LAB — BETA 2 MICROGLOBULIN, SERUM: Beta-2 Microglobulin: 3 mg/L — ABNORMAL HIGH (ref 0.6–2.4)

## 2021-08-22 NOTE — Progress Notes (Signed)
Daily Session Note ? ?Patient Details  ?Name: Nancy Rodriguez ?MRN: [id number] ?Date of Birth: Dec 16, 1945 ?Referring Provider:   ?Flowsheet Row Cancer Associated Rehabilitation & Exercise from 07/18/2021 in [facility] Cardiac and Pulmonary Rehab  ?Referring Provider [name] MD  ? ?  ? ? ?Encounter Date: 08/22/2021 ? ?Check In: ? Session Check In - 08/22/21 1237   ? ?  ? Check-In  ? Supervising physician immediately available to respond to emergencies See telemetry face sheet for immediately available ER MD   ? Location [facility]-Cardiac & Pulmonary Rehab   ? Staff Present Alberteen Sam, MA, RCEP, CCRP, Marylynn Pearson, MS, ASCM CEP, Exercise Physiologist;Susanne Bice, RN, BSN, CCRP   ? Virtual Visit No   ? Medication changes reported     No   ? Fall or balance concerns reported    No   ? Warm-up and Cool-down Performed on first and last piece of equipment   ? Resistance Training Performed Yes   ? VAD Patient? No   ? PAD/SET Patient? No   ?  ? Pain Assessment  ? Currently in Pain? No/denies   ? ?  ?  ? ?  ? ? ? ? ? ?Social History  ? ?Tobacco Use  ?Smoking Status Never  ?Smokeless Tobacco Never  ? ? ?Goals Met:  ?Proper associated with RPD/PD & O2 Sat ?Independence with exercise equipment ?Using PLB without cueing & demonstrates good technique ?Exercise tolerated well ?No report of concerns or symptoms today ?Strength training completed today ? ?Goals Unmet:  ?Not Applicable ? ?Comments: Pt able to follow exercise prescription today without complaint.  Will continue to monitor for progression. ? ? ? ?Dr. Emily Filbert is Medical Director for [place].  ?Dr. Ottie Glazier is Medical Director for [facility] Pulmonary Rehabilitation. ?

## 2021-08-22 NOTE — Addendum Note (Signed)
Addended by: [name] on: 08/22/2021 02:48 PM ? ? Modules accepted: Orders ? ?

## 2021-08-24 ENCOUNTER — Encounter: Payer: Medicare Other | Admitting: *Deleted

## 2021-08-24 DIAGNOSIS — E859 Amyloidosis, unspecified: Secondary | ICD-10-CM

## 2021-08-24 NOTE — Progress Notes (Signed)
Daily Session Note ? ?Patient Details  ?Name: Nancy Rodriguez ?MRN: [id number] ?Date of Birth: 20-Dec-1945 ?Referring Provider:   ?Flowsheet Row Cancer Associated Rehabilitation & Exercise from 07/18/2021 in [facility] Cardiac and Pulmonary Rehab  ?Referring Provider [name] MD  ? ?  ? ? ?Encounter Date: 08/24/2021 ? ?Check In: ? Session Check In - 08/24/21 1230   ? ?  ? Check-In  ? Supervising physician immediately available to respond to emergencies See telemetry face sheet for immediately available ER MD   ? Location [facility]-Cardiac & Pulmonary Rehab   ? Staff Present Alberteen Sam, MA, RCEP, CCRP, Marylynn Pearson, MS, ASCM CEP, Exercise Physiologist   ? Virtual Visit No   ? Medication changes reported     No   ? Fall or balance concerns reported    No   ? Warm-up and Cool-down Performed on first and last piece of equipment   ? Resistance Training Performed Yes   ? VAD Patient? No   ? PAD/SET Patient? No   ?  ? Pain Assessment  ? Currently in Pain? No/denies   ? ?  ?  ? ?  ? ? ? ? ? ?Social History  ? ?Tobacco Use  ?Smoking Status Never  ?Smokeless Tobacco Never  ? ? ?Goals Met:  ?Proper associated with RPD/PD & O2 Sat ?Independence with exercise equipment ?Exercise tolerated well ?No report of concerns or symptoms today ?Strength training completed today ? ?Goals Unmet:  ?Not Applicable ? ?Comments: Pt able to follow exercise prescription today without complaint.  Will continue to monitor for progression. ? ? ? ?Dr. Emily Filbert is Medical Director for [place].  ?Dr. Ottie Glazier is Medical Director for [facility] Pulmonary Rehabilitation. ?

## 2021-08-28 LAB — MULTIPLE MYELOMA PANEL, SERUM
Albumin SerPl Elph-Mcnc: 2.5 g/dL — ABNORMAL LOW (ref 2.9–4.4)
Albumin/Glob SerPl: 1.1 (ref 0.7–1.7)
Alpha 1: 0.4 g/dL (ref 0.0–0.4)
Alpha2 Glob SerPl Elph-Mcnc: 0.7 g/dL (ref 0.4–1.0)
B-Globulin SerPl Elph-Mcnc: 0.9 g/dL (ref 0.7–1.3)
Gamma Glob SerPl Elph-Mcnc: 0.4 g/dL (ref 0.4–1.8)
Globulin, Total: 2.4 g/dL (ref 2.2–3.9)
IgA: 108 mg/dL (ref 64–422)
IgG (Immunoglobin G), Serum: 444 mg/dL — ABNORMAL LOW (ref 586–1602)
IgM (Immunoglobulin M), Srm: 45 mg/dL (ref 26–217)
Total Protein ELP: 4.9 g/dL — ABNORMAL LOW (ref 6.0–8.5)

## 2021-08-29 DIAGNOSIS — E859 Amyloidosis, unspecified: Secondary | ICD-10-CM

## 2021-08-29 NOTE — Progress Notes (Signed)
Daily Session Note ? ?Patient Details  ?Name: Nancy Rodriguez ?MRN: [id number] ?Date of Birth: 05/06/1945 ?Referring Provider:   ?Flowsheet Row Cancer Associated Rehabilitation & Exercise from 07/18/2021 in [facility] Cardiac and Pulmonary Rehab  ?Referring Provider Yu, Zhou MD  ? ?  ? ? ?Encounter Date: 08/29/2021 ? ?Check In: ? Session Check In - 08/29/21 1223   ? ?  ? Check-In  ? Supervising physician immediately available to respond to emergencies See telemetry face sheet for immediately available ER MD   ? Location [facility]-Cardiac & Pulmonary Rehab   ? Staff Present  , MPA, RN;Melissa Caiola, RDN, LDN;Kara Langdon, MS, ASCM CEP, Exercise Physiologist;Jessica Hawkins, MA, RCEP, CCRP, CCET;Susanne Bice, RN, BSN, CCRP   ? Virtual Visit No   ? Medication changes reported     No   ? Fall or balance concerns reported    No   ? Warm-up and Cool-down Performed on first and last piece of equipment   ? Resistance Training Performed Yes   ? VAD Patient? No   ? PAD/SET Patient? No   ?  ? Pain Assessment  ? Currently in Pain? No/denies   ? ?  ?  ? ?  ? ? ? ? ? ?Social History  ? ?Tobacco Use  ?Smoking Status Never  ?Smokeless Tobacco Never  ? ? ?Goals Met:  ?Independence with exercise equipment ?Exercise tolerated well ?No report of concerns or symptoms today ?Strength training completed today ? ?Goals Unmet:  ?Not Applicable ? ?Comments: Pt able to follow exercise prescription today without complaint.  Will continue to monitor for progression. ? ? ? ?Dr. Mark Miller is Medical Director for [facility]iac [facility].  ?Dr. Fuad Aleskerov is Medical Director for [facility] Pulmonary Rehabilitation. ?

## 2021-08-30 ENCOUNTER — Inpatient Hospital Stay: Payer: Medicare Other

## 2021-08-30 NOTE — Progress Notes (Signed)
Nutrition Follow-up: ? ?Patient with AL lambda amylodosis.  Patient on darazalex and revlimid.   ? ?Spoke with patient via phone for nutrition follow-up.  Patient reports that her appetite is up and down.  Has eaten oatmeal and cornflakes with small amount of ham today. Says that she is drinking low sugar shakes.  Patient is participating in the CARE program and says this has helped her walking and stamina.  Says that she is walking without her cane at this time.   ? ? ? ?Medications: reviewed ? ?Labs: reviewed ? ?Anthropometrics:  ? ?Weight 126 lb 4.8 oz ? ?124 lb on 3/10 ?119 lb on 2/14 ?132 lb on 1/12 (edema both legs) ?139 lb on 11/17 (edema present) ?129 lb on 9/9 ?142 lb on 5/25 ? ? ?NUTRITION DIAGNOSIS: Unintentional weight loss stable ? ? ? ?INTERVENTION:  ?Continue oral nutrition supplements ?Encouraged well balanced diet including good sources of protein ?Patient has contact information ?  ? ?MONITORING, EVALUATION:  weight trends, intake ? ? ?NEXT VISIT: as needed ? ?Livi Mcgann B. Zenia Resides, RD, LDN ?Registered Dietitian ?[phone] ? ? ?

## 2021-08-31 DIAGNOSIS — E859 Amyloidosis, unspecified: Secondary | ICD-10-CM

## 2021-08-31 NOTE — Progress Notes (Signed)
Daily Session Note  Patient Details  Name: Nancy Rodriguez MRN: [id number] Date of Birth: 1945-11-11 Referring Provider:   [name] Cancer Associated Rehabilitation & Exercise from 07/18/2021 in [facility] Cardiac and Pulmonary Rehab  Referring Provider [name] MD       Encounter Date: 08/31/2021  Check In:  Session Check In - 08/31/21 1224       Check-In   Supervising physician immediately available to respond to emergencies See telemetry face sheet for immediately available ER MD    Location [facility]-Cardiac & Pulmonary Rehab    Staff Present Coralie Keens, MS, ASCM CEP, Exercise Physiologist;Jessica Luan Pulling, MA, RCEP, CCRP, CCET;Danissa Rundle, RN, BSN    Virtual Visit No    Medication changes reported     No    Fall or balance concerns reported    No    Resistance Training Performed Yes    VAD Patient? No    PAD/SET Patient? No      Pain Assessment   Currently in Pain? No/denies                Social History   Tobacco Use  Smoking Status Never  Smokeless Tobacco Never    Goals Met:  Proper associated with RPD/PD & O2 Sat Independence with exercise equipment Using PLB without cueing & demonstrates good technique Exercise tolerated well No report of concerns or symptoms today Strength training completed today  Goals Unmet:  Not Applicable  Comments: Pt able to follow exercise prescription today without complaint.  Will continue to monitor for progression.    Dr. Emily Filbert is Medical Director for [place].  Dr. Ottie Glazier is Medical Director for [facility] Pulmonary Rehabilitation.

## 2021-09-05 DIAGNOSIS — E859 Amyloidosis, unspecified: Secondary | ICD-10-CM

## 2021-09-05 NOTE — Progress Notes (Signed)
Daily Session Note  Patient Details  Name: Nancy Rodriguez MRN: [id number] Date of Birth: 10/19/1945 Referring Provider:   Flowsheet Row Cancer Associated Rehabilitation & Exercise from 07/18/2021 in [facility] Cardiac and Pulmonary Rehab  Referring Provider Yu, Zhou MD       Encounter Date: 09/05/2021  Check In:  Session Check In - 09/05/21 1214       Check-In   Supervising physician immediately available to respond to emergencies See telemetry face sheet for immediately available ER MD    Location [facility]-Cardiac & Pulmonary Rehab    Staff Present  , MPA, RN;Jessica Hawkins, MA, RCEP, CCRP, CCET;Kara Langdon, MS, ASCM CEP, Exercise Physiologist    Virtual Visit No    Medication changes reported     No    Fall or balance concerns reported    No    Warm-up and Cool-down Performed on first and last piece of equipment    Resistance Training Performed Yes    VAD Patient? No    PAD/SET Patient? No      Pain Assessment   Currently in Pain? No/denies                Social History   Tobacco Use  Smoking Status Never  Smokeless Tobacco Never    Goals Met:  Independence with exercise equipment Exercise tolerated well No report of concerns or symptoms today Strength training completed today  Goals Unmet:  Not Applicable  Comments: Pt able to follow exercise prescription today without complaint.  Will continue to monitor for progression.    Dr. Mark Miller is Medical Director for [facility].  Dr. Fuad Aleskerov is Medical Director for [facility] Pulmonary Rehabilitation. 

## 2021-09-07 DIAGNOSIS — E859 Amyloidosis, unspecified: Secondary | ICD-10-CM

## 2021-09-07 NOTE — Progress Notes (Signed)
Daily Session Note  Patient Details  Name: Nancy Rodriguez MRN: [id number] Date of Birth: [birth year] Referring Provider:   [name] Cancer Associated Rehabilitation & Exercise from 07/18/2021 in [facility] Cardiac and Pulmonary Rehab  Referring Provider [name] MD       Encounter Date: 09/07/2021  Check In:  Session Check In - 09/07/21 1305       Check-In   Supervising physician immediately available to respond to emergencies See telemetry face sheet for immediately available ER MD    Location [facility]-Cardiac & Pulmonary Rehab    Staff Present Coralie Keens, MS, ASCM CEP, Exercise Physiologist;Laureen Owens Shark, BS, RRT, CPFT    Virtual Visit No    Medication changes reported     No    Fall or balance concerns reported    No    Warm-up and Cool-down Performed on first and last piece of equipment    Resistance Training Performed Yes    VAD Patient? No    PAD/SET Patient? No                Social History   Tobacco Use  Smoking Status Never  Smokeless Tobacco Never    Goals Met:  Proper associated with RPD/PD & O2 Sat Independence with exercise equipment Exercise tolerated well No report of concerns or symptoms today Strength training completed today  Goals Unmet:  Not Applicable  Comments: Pt able to follow exercise prescription today without complaint.  Will continue to monitor for progression.    Dr. Emily Filbert is Medical Director for [place].  Dr. Ottie Glazier is Medical Director for [facility] Pulmonary Rehabilitation.

## 2021-09-12 ENCOUNTER — Other Ambulatory Visit: Payer: Self-pay | Admitting: *Deleted

## 2021-09-12 DIAGNOSIS — E859 Amyloidosis, unspecified: Secondary | ICD-10-CM

## 2021-09-12 DIAGNOSIS — E8581 Light chain (AL) amyloidosis: Secondary | ICD-10-CM

## 2021-09-12 MED ORDER — LENALIDOMIDE 10 MG PO CAPS: ORAL_CAPSULE | ORAL | 0 refills | Status: DC

## 2021-09-12 NOTE — Progress Notes (Signed)
Daily Session Note  Patient Details  Name: Nancy Rodriguez MRN: [id number] Date of Birth: 04/14/1946 Referring Provider:   Flowsheet Row Cancer Associated Rehabilitation & Exercise from 07/18/2021 in [facility]  Referring Provider Yu, Zhou MD       Encounter Date: 09/12/2021  Check In:  Session Check In - 09/12/21 1211       Check-In   Supervising physician immediately available to respond to emergencies See telemetry face sheet for immediately available ER MD    Location [facility]    Staff Present  , MPA, RN;Kara Langdon, MS, ASCM CEP, Exercise Physiologist;Jessica Hawkins, MA, RCEP, CCRP, CCET    Virtual Visit No    Medication changes reported     No    Fall or balance concerns reported    No    Warm-up and Cool-down Performed on first and last piece of equipment    Resistance Training Performed Yes    VAD Patient? No    PAD/SET Patient? No      Pain Assessment   Currently in Pain? No/denies                Social History   Tobacco Use  Smoking Status Never  Smokeless Tobacco Never    Goals Met:  Independence with exercise equipment Exercise tolerated well No report of concerns or symptoms today Strength training completed today  Goals Unmet:  Not Applicable  Comments: Pt able to follow exercise prescription today without complaint.  Will continue to monitor for progression.    Dr. Mark Miller is Medical Director for [facility].  Dr. Fuad Aleskerov is Medical Director for [facility]. 

## 2021-09-14 ENCOUNTER — Encounter: Payer: Medicare Other | Attending: Oncology

## 2021-09-14 DIAGNOSIS — E859 Amyloidosis, unspecified: Secondary | ICD-10-CM | POA: Insufficient documentation

## 2021-09-14 NOTE — Progress Notes (Signed)
Daily Session Note  Patient Details  Name: Nancy Rodriguez MRN: [id number] Date of Birth: [birth year] Referring Provider:   [name] Cancer Associated Rehabilitation & Exercise from 07/18/2021 in [facility]  Referring Provider [name] MD       Encounter Date: 09/14/2021  Check In:  Session Check In - 09/14/21 1219       Check-In   Supervising physician immediately available to respond to emergencies See telemetry face sheet for immediately available ER MD    Location [facility]    Staff Present Birdie Sons, MPA, Nino Glow, MS, ASCM CEP, Exercise Physiologist;Noah Tickle, BS, Exercise Physiologist;Jessica Luan Pulling, MA, RCEP, CCRP, CCET    Virtual Visit No    Medication changes reported     No    Fall or balance concerns reported    No    Warm-up and Cool-down Performed on first and last piece of equipment    Resistance Training Performed Yes    VAD Patient? No    PAD/SET Patient? No      Pain Assessment   Currently in Pain? No/denies                Social History   Tobacco Use  Smoking Status Never  Smokeless Tobacco Never    Goals Met:  Independence with exercise equipment Exercise tolerated well No report of concerns or symptoms today Strength training completed today  Goals Unmet:  Not Applicable  Comments: Pt able to follow exercise prescription today without complaint.  Will continue to monitor for progression.    Dr. Emily Filbert is Medical Director for [place].  Dr. Ottie Glazier is Medical Director for [facility].

## 2021-09-18 ENCOUNTER — Inpatient Hospital Stay: Payer: Medicare Other | Admitting: Oncology

## 2021-09-18 ENCOUNTER — Inpatient Hospital Stay: Payer: Medicare Other

## 2021-09-18 ENCOUNTER — Encounter: Payer: Self-pay | Admitting: Oncology

## 2021-09-18 ENCOUNTER — Inpatient Hospital Stay: Payer: Medicare Other | Attending: Oncology

## 2021-09-18 VITALS — BP 144/73 | HR 85 | Temp 96.5°F | Resp 18 | Wt 126.4 lb

## 2021-09-18 DIAGNOSIS — S065XAA Traumatic subdural hemorrhage with loss of consciousness status unknown, initial encounter: Secondary | ICD-10-CM | POA: Diagnosis not present

## 2021-09-18 DIAGNOSIS — E859 Amyloidosis, unspecified: Secondary | ICD-10-CM

## 2021-09-18 DIAGNOSIS — N1831 Chronic kidney disease, stage 3a: Secondary | ICD-10-CM | POA: Diagnosis not present

## 2021-09-18 DIAGNOSIS — E876 Hypokalemia: Secondary | ICD-10-CM | POA: Diagnosis not present

## 2021-09-18 DIAGNOSIS — Z79899 Other long term (current) drug therapy: Secondary | ICD-10-CM | POA: Diagnosis not present

## 2021-09-18 DIAGNOSIS — E8581 Light chain (AL) amyloidosis: Secondary | ICD-10-CM

## 2021-09-18 DIAGNOSIS — D500 Iron deficiency anemia secondary to blood loss (chronic): Secondary | ICD-10-CM

## 2021-09-18 DIAGNOSIS — I130 Hypertensive heart and chronic kidney disease with heart failure and stage 1 through stage 4 chronic kidney disease, or unspecified chronic kidney disease: Secondary | ICD-10-CM | POA: Diagnosis not present

## 2021-09-18 DIAGNOSIS — K862 Cyst of pancreas: Secondary | ICD-10-CM

## 2021-09-18 DIAGNOSIS — Z5111 Encounter for antineoplastic chemotherapy: Secondary | ICD-10-CM

## 2021-09-18 DIAGNOSIS — R634 Abnormal weight loss: Secondary | ICD-10-CM

## 2021-09-18 DIAGNOSIS — R7989 Other specified abnormal findings of blood chemistry: Secondary | ICD-10-CM

## 2021-09-18 DIAGNOSIS — K77 Liver disorders in diseases classified elsewhere: Secondary | ICD-10-CM

## 2021-09-18 DIAGNOSIS — D631 Anemia in chronic kidney disease: Secondary | ICD-10-CM

## 2021-09-18 DIAGNOSIS — E854 Organ-limited amyloidosis: Secondary | ICD-10-CM | POA: Diagnosis not present

## 2021-09-18 LAB — CBC WITH DIFFERENTIAL/PLATELET
Abs Immature Granulocytes: 0.06 10*3/uL (ref 0.00–0.07)
Basophils Absolute: 0.1 10*3/uL (ref 0.0–0.1)
Basophils Relative: 1 %
Eosinophils Absolute: 0.2 10*3/uL (ref 0.0–0.5)
Eosinophils Relative: 2 %
HCT: 32.7 % — ABNORMAL LOW (ref 36.0–46.0)
Hemoglobin: 11.7 g/dL — ABNORMAL LOW (ref 12.0–15.0)
Immature Granulocytes: 1 %
Lymphocytes Relative: 14 %
Lymphs Abs: 1.3 10*3/uL (ref 0.7–4.0)
MCH: 27.7 pg (ref 26.0–34.0)
MCHC: 35.8 g/dL (ref 30.0–36.0)
MCV: 77.5 fL — ABNORMAL LOW (ref 80.0–100.0)
Monocytes Absolute: 0.8 10*3/uL (ref 0.1–1.0)
Monocytes Relative: 9 %
Neutro Abs: 6.9 10*3/uL (ref 1.7–7.7)
Neutrophils Relative %: 73 %
Platelets: 435 10*3/uL — ABNORMAL HIGH (ref 150–400)
RBC: 4.22 MIL/uL (ref 3.87–5.11)
RDW: 23.2 % — ABNORMAL HIGH (ref 11.5–15.5)
WBC: 9.4 10*3/uL (ref 4.0–10.5)
nRBC: 0 % (ref 0.0–0.2)

## 2021-09-18 LAB — COMPREHENSIVE METABOLIC PANEL
ALT: 30 U/L (ref 0–44)
AST: 47 U/L — ABNORMAL HIGH (ref 15–41)
Albumin: 2.8 g/dL — ABNORMAL LOW (ref 3.5–5.0)
Alkaline Phosphatase: 898 U/L — ABNORMAL HIGH (ref 38–126)
Anion gap: 5 (ref 5–15)
BUN: 21 mg/dL (ref 8–23)
CO2: 23 mmol/L (ref 22–32)
Calcium: 8.4 mg/dL — ABNORMAL LOW (ref 8.9–10.3)
Chloride: 109 mmol/L (ref 98–111)
Creatinine, Ser: 1.12 mg/dL — ABNORMAL HIGH (ref 0.44–1.00)
GFR, Estimated: 51 mL/min — ABNORMAL LOW (ref >60)
Glucose, Bld: 161 mg/dL — ABNORMAL HIGH (ref 70–99)
Potassium: 3.7 mmol/L (ref 3.5–5.1)
Sodium: 137 mmol/L (ref 135–145)
Total Bilirubin: 4.7 mg/dL — ABNORMAL HIGH (ref 0.3–1.2)
Total Protein: 6.2 g/dL — ABNORMAL LOW (ref 6.5–8.1)

## 2021-09-18 LAB — APTT: aPTT: 32 seconds (ref 24–36)

## 2021-09-18 LAB — PROTIME-INR
INR: 1.2 (ref 0.8–1.2)
Prothrombin Time: 15.1 seconds (ref 11.4–15.2)

## 2021-09-18 MED ORDER — ACETAMINOPHEN 325 MG PO TABS
ORAL_TABLET | ORAL | Status: AC
  Filled 2021-09-18: qty 1

## 2021-09-18 MED ORDER — ACETAMINOPHEN 325 MG PO TABS
325.0000 mg | ORAL_TABLET | Freq: Once | ORAL | Status: AC
  Administered 2021-09-18: 325 mg via ORAL

## 2021-09-18 MED ORDER — DIPHENHYDRAMINE HCL 25 MG PO CAPS
ORAL_CAPSULE | ORAL | Status: AC
  Filled 2021-09-18: qty 2

## 2021-09-18 MED ORDER — DARATUMUMAB-HYALURONIDASE-FIHJ 1800-30000 MG-UT/15ML ~~LOC~~ SOLN
1800.0000 mg | Freq: Once | SUBCUTANEOUS | Status: AC
  Administered 2021-09-18: 1800 mg via SUBCUTANEOUS
  Filled 2021-09-18: qty 15

## 2021-09-18 MED ORDER — DIPHENHYDRAMINE HCL 25 MG PO CAPS
50.0000 mg | ORAL_CAPSULE | Freq: Once | ORAL | Status: AC
  Administered 2021-09-18: 50 mg via ORAL

## 2021-09-18 MED ORDER — DEXAMETHASONE 4 MG PO TABS
20.0000 mg | ORAL_TABLET | Freq: Once | ORAL | Status: AC
  Administered 2021-09-18: 20 mg via ORAL

## 2021-09-18 MED ORDER — DEXAMETHASONE 4 MG PO TABS
ORAL_TABLET | ORAL | Status: AC
  Filled 2021-09-18: qty 5

## 2021-09-18 NOTE — Progress Notes (Signed)
Hematology/Oncology Progress note [phone] [phone]      Patient Care Team: Leonel Ramsay, MD as PCP - General (Infectious Diseases) End, [name], MD as PCP - Cardiology (Cardiology) [name], MD as Consulting Physician (Oncology)  REFERRING PROVIDER: Leonel Ramsay, MD  CHIEF COMPLAINTS/REASON FOR VISIT:  Follow-up for amyloidosis  HISTORY OF PRESENTING ILLNESS:   Nancy Rodriguez is a  76 y.o.  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  She was seen by Hematology Oncology at [facility] on 02/14/2017.  07/25/2007  SPEP showed M protein of 0.35, IFE showed IgG lamda 09/22/2007  Bone survey negative.  02/15/14 IgG 930, SPEP M protien 0.22, free kappa light chain 3.59, lamda 2.92, ratio 1.23  Hypercalcemia, resolved after stopping HCTZ.  # Transaminitis 03/28/20 US abdomen showed left liver lobe Complex 1.7 cm cyst  # 04/07/20 MRI abdomen w/wo contrast showed 2 benign liver cysts, 2 nonspecific hypovascular irighr liver lesions, abnormal bone marrow signal of L1 vertebral body. Patient was  advised to proceed with bone marrow biopsy and she declined.  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  Patient changed her mind and ask a biopsy to be canceled.  My team and I had multiple phone discussion with patient's daughter Nancy Rodriguez and later with the patient's son Nancy Rodriguez.  Patient agreed with bone marrow biopsy and a biopsy was scheduled on 06/09/2020.  05/20/20 NT proBNP  was normal,  TSH slightly increased, normal T4 Normal factor X Normal coags Troponin 19. Refer to cardiology for  evaluation. May need cardiac MRI  # 06/01/2020 2D echo result was reviewed and discussed with patient.  Patient has normal LVEF 60-65%. Mild asymmetric left ventricular hypertrophy.  Left ventricular diastolic parameters are indeterminate. average left ventricular global longitudinal strain is -16.8 %.  # 06/09/2020, bone marrow biopsy showed monoclonal plasmacytosis, 8%, amyloid deposit present.  Absent iron stores. 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 t (14;20) #06/17/2020, further discussed about diagnosis and treatment plan.  Patient declined bone marrow transplant evaluation.  Decision was made to proceed with Dara-CyborD chemotherapy treatments. #May 2022, second opinion at [facility] with Dr. Tracey Harries.who agrees with current treatment plan.  He recommends to lower dexamethasone to 20 mg and decrease premed Tylenol to 325 mg  09/01/2020 cardiac MRI was performed at [facility].  Left ventricle is normal in size.  Mild to moderate basal septal hypertrophy.  Hyper dynamic LV function.  LVEF 71%.  Right ventricle is normal in size and wall thickness.  Systolic function normal.  Mild bi atrial enlargement, no significant aortic valve stenosis or regurgitation.  Trivial mitral regurgitation, mild tricuspid regurgitation and a trivial pulmonic regurgitation.  No evidence of MI, scarring or infiltration.  #History of major depression listed in outside problem list.  previous diagnosis and treatment details are not available to me in EMR  I have referred her to psychiatrist  #  vaginal bleeding which stopped- she was seen by GYN and recommended for biopsy.   # She has poor IV access and was not able to receive IV Emend and IV Aloxi.  She does not want to have med port.   # GERD   Dr. Ola Spurr has increased her omeprazole to 40 mg daily. She feels symptoms are improved.  # 10/08/2020, cervical spine showed cervical spondylosis.  Spinal canal diagnosis and a posterior  disc osteophyte complex and superimposed small central disc protrusion contact the ventral spinal cord at C4-C5.  Multilevel neuroforaminal narrowing. bilateral atlantooccipital joint effusions, multilevel grade 1 spondylolisthesis. MRI lumbar spine without contrast showed acute or subacute fracture of L4.   Moderate spinal stenosis at L3-4 has progressed. Shallow left foraminal disc protrusion has progressed. Moderate subarticular stenosis on the left also with progression  Moderate spinal stenosis L4-5 with progression. Moderate subarticular stenosis on the left with progression  # 10/25/2020 seen by [place] who recommend patient to switch Dara CyBorD to Dara RD  # 11/03/2020 Revlimid (lenalidomide) 10 mg by mouth daily for 14 days, then hold for 7 days. Pt was started on a shortened cycle in order to remain on similar schedule with infusion.   # 11/22/2020 seen by hepatologist Dr.Kappus Rodman Key. Recommended to start Zoloft 42m daily for pruritis # 11/24/2020 cycle 2 Revlimid 10 mg, she finished 14 days.  Rest of the course was held due to anemia.  Patient declined blood transfusion.  # Vaginal bleeding  patient was seen by Dr. BLeafy Ro  on megace 442mdaily. 12/22/2020 repeat EMBx - not diagnostic due to insufficient squamous component.   03/03/2021, ultrasound abdomen complete showed possible hypoechoic mass in the pancreatic tail.  1.8 x 1.2 x 1.4 cm.  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  Cholelithiasis.  MRI has been ordered for further evaluation and is scheduled.  + [facility] Patient was found to have marked hyperglycemia, AKI, UTI. 05/03/2021, CT head showed chronic appearing subdural hematoma, measuring up to 1.6 on the left and 0.9 on the right. 05/18/2021, patient was seen by cardiology Dr.End, patient was recommended to continue torsemide 20 mg daily as needed for edema.  Patient was recommended to discontinue aspirin 81 mg  due to frequent falls and chronic subdural hematoma.  Patient has not had further falls.  She has home physical therapy   INTERVAL HISTORY ShJALAYNA JOSTENs a [age].o. female who has above history reviewed by me today presents for follow up visit for management of AL amyloidosis Patient was accompanied by her daughter today. Diarrhea has stopped 2 weeks ago. Weakness has improved. She is able to participate in physical therapy.  + Itchiness.  Appetite is not god. Weight is stable.   Review of Systems  Constitutional:  Positive for fatigue. Negative for appetite change, chills, fever and unexpected weight change.  HENT:   Negative for hearing loss, nosebleeds and voice change.   Eyes:  Positive for icterus. Negative for eye problems.  Respiratory:  Negative for chest tightness and cough.   Cardiovascular:  Negative for chest pain and leg swelling.  Gastrointestinal:  Negative for abdominal distention, abdominal pain, blood in stool and nausea.  Endocrine: Negative for hot flashes.  Genitourinary:  Negative for difficulty urinating and frequency.   Musculoskeletal:  Positive for back pain. Negative for arthralgias.  Skin:  Positive for itching. Negative for rash.       jaundice  Neurological:  Positive for numbness. Negative for extremity weakness.  Hematological:  Negative for adenopathy.  Psychiatric/Behavioral:  Negative for confusion.    MEDICAL HISTORY:  Past Medical History:  Diagnosis Date   (  HFpEF) heart failure with preserved ejection fraction ([place])    a. 05/2020 Echo: EF 60-65%; b. 08/2020 cMRI ([facility]): EF 71%, no delayed hyperenhancement to suggest scar/infiltrative dzs. Nl RV fxn. Mild BAE. Triv MR, mild TR; c. 06/2021 Echo: EF 50-55%, no rwma, mild basal-septal LVH, GrII DD. Nl RV size/fxn. Mild MR. Ao sclerosis.   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 03/2012 Stress Echo([facility]): nl study.   Hypercholesterolemia    Hypertension    MGUS (monoclonal gammopathy of unknown significance)    Osteoarthritis    Rheumatoid arthritis ([place])     SURGICAL HISTORY: Past Surgical History:  Procedure Laterality Date   HERNIA REPAIR     PARATHYROIDECTOMY      SOCIAL HISTORY: Social History   Socioeconomic History   Marital status: Widowed    Spouse name: Not on file   Number of children: 6   Years of education: Not on file   Highest education level: Not on file  Occupational History   Not on file  Tobacco Use   Smoking status: Never   Smokeless tobacco: Never  Vaping Use   Vaping Use: Never used  Substance and Sexual Activity   Alcohol use: No   Drug use: Never   Sexual activity: Not on file  Other Topics Concern   Not on file  Social History Narrative   Not on file   Social Determinants of Health   Financial Resource Strain: Not on file  Food Insecurity: Not on file  Transportation Needs: Not on file  Physical Activity: Not on file  Stress: Not on file  Social Connections: Not on file  Intimate Partner Violence: Not on file    FAMILY HISTORY: Family History  Problem Relation Age of Onset   Diabetes Daughter    Diabetes Son    Dementia Mother    Cancer Father    Esophageal cancer Sister    Brain cancer Brother     ALLERGIES:  is allergic to benazepril, lisinopril, tolmetin, and nsaids.  MEDICATIONS:  Current Outpatient Medications  Medication Sig Dispense Refill   acyclovir (ZOVIRAX) 400 MG tablet Take 1 tablet (400 mg total) by mouth 2 (two) times daily. 60 tablet 2   albuterol (VENTOLIN HFA) 108 (90 Base) MCG/ACT inhaler Inhale 2 puffs into the lungs every 4 (four) hours as needed for wheezing or shortness of breath.     amLODipine (NORVASC) 5 MG tablet Take 5 mg by mouth daily.     Continuous Blood Gluc Sensor (FREESTYLE LIBRE  14 DAY SENSOR) MISC SMARTSIG:1 Kit(s) Topical Every 2 Weeks     [employer] VITAMIN C 500 MG tablet TAKE 1 TABLET BY MOUTH DAILY 30 tablet 1   gabapentin (NEURONTIN) 100 MG capsule Take 100 mg by mouth at bedtime.     insulin glargine (LANTUS SOLOSTAR) 100 UNIT/ML Solostar Pen Inject 15 Units into the skin at bedtime.     insulin lispro (HUMALOG) 100 UNIT/ML injection Inject into the skin 3 (three) times daily before meals. Sliding scale     KLOR-CON M20 20 MEQ tablet TAKE 1 TABLET BY MOUTH TWICE A DAY 60 tablet 1   metoprolol succinate (TOPROL-XL) 50 MG 24 hr tablet Take 50 mg by mouth daily.  omeprazole (PRILOSEC) 40 MG capsule Take 40 mg by mouth daily.     sertraline (ZOLOFT) 25 MG tablet Take 50 mg by mouth daily.     torsemide (DEMADEX) 20 MG tablet Take 20 mg by mouth daily as needed.     dexamethasone (DECADRON) 4 MG tablet TAKE 5 TABLETS BY MOUTH ONCE A WEEK. (Patient not taking: Reported on 09/18/2021) 60 tablet 0   EPINEPHrine 0.3 mg/0.3 mL IJ SOAJ injection Inject 0.3 mg into the muscle as needed. (Patient not taking: Reported on 06/23/2021)     lenalidomide (REVLIMID) 10 MG capsule Take 1 capsule (10 mg total) by mouth daily. Take for 21 days, then hold for 7 days. Repeat every 28 days. (Patient not taking: Reported on 09/18/2021) 21 capsule 0   loperamide (IMODIUM) 2 MG capsule Take 1 capsule (2 mg total) by mouth See admin instructions. Initial: 4 mg, followed by 2 mg after each loose stool, maximum 16 mg within 24 hours. (Patient not taking: Reported on 07/17/2021) 60 capsule 0   No current facility-administered medications for this visit.     PHYSICAL EXAMINATION: ECOG PERFORMANCE STATUS: 2 - Symptomatic, <50% confined to bed Vitals:   09/18/21 0850  BP: (!) 144/73  Pulse: 85  Resp: 18  Temp: (!) 96.5 F (35.8 C)   Filed Weights   09/18/21 0850  Weight: 126 lb 6.4 oz (57.3 kg)    Physical Exam Constitutional:      General: She is not in acute distress.    Appearance: She is  ill-appearing.  HENT:     Head: Normocephalic and atraumatic.  Eyes:     General: No scleral icterus. Cardiovascular:     Rate and Rhythm: Normal rate and regular rhythm.     Heart sounds: Murmur heard.  Pulmonary:     Effort: Pulmonary effort is normal. No respiratory distress.     Breath sounds: No wheezing.  Abdominal:     General: Bowel sounds are normal. There is distension.     Palpations: Abdomen is soft.  Musculoskeletal:        General: No deformity. Normal range of motion.     Cervical back: Normal range of motion and neck supple.     Comments: Bilateral lower extremities trace edema  Skin:    General: Skin is warm and dry.     Findings: No erythema or rash.     Comments: Several subcentimeter subcutaneous firm nodules on abdomen wall  Neurological:     Mental Status: She is alert. Mental status is at baseline.     Cranial Nerves: No cranial nerve deficit.     Coordination: Coordination normal.  Psychiatric:        Mood and Affect: Mood normal.    LABORATORY DATA:  I have reviewed the data as listed Lab Results  Component Value Date   WBC 9.4 09/18/2021   HGB 11.7 (L) 09/18/2021   HCT 32.7 (L) 09/18/2021   MCV 77.5 (L) 09/18/2021   PLT 435 (H) 09/18/2021   Recent Labs    07/24/21 0843 08/21/21 0832 09/18/21 0828  NA 136 139 137  K 3.8 3.5 3.7  CL 112* 112* 109  CO2 18* 18* 23  GLUCOSE 213* 186* 161*  BUN 26* 32* 21  CREATININE 0.90 1.14* 1.12*  CALCIUM 8.5* 8.7* 8.4*  GFRNONAA >60 50* 51*  PROT 5.5* 5.6* 6.2*  ALBUMIN 2.5* 2.7* 2.8*  AST 37 40 47*  ALT 33 38 30  ALKPHOS 905* 852* 898*  BILITOT 4.9* 4.4* 4.7*    Iron/TIBC/Ferritin/ %Sat    Component Value Date/Time   IRON 23 (L) 02/16/2021 1042   TIBC 316 02/16/2021 1042   FERRITIN 28 02/16/2021 1042   IRONPCTSAT 7 (L) 02/16/2021 1042     08/25/2019, platelet count 491, WBC 7.5, hemoglobin 12 Creatinine 1.58, EGFR 37, calcium 10.8, albumin 4.2 Negative hepatitis B surface antigen,  hepatitis B core antibody, Negative hepatitis C 08/05/2019, A1c 11.2   RADIOGRAPHIC STUDIES: I have personally reviewed the radiological images as listed and agreed with the findings in the report. CT HEAD WO CONTRAST (5MM)  Result Date: 07/12/2021 CLINICAL DATA:  Follow-up subdural hematoma EXAM: CT HEAD WITHOUT CONTRAST TECHNIQUE: Contiguous axial images were obtained from the base of the skull through the vertex without intravenous contrast. RADIATION DOSE REDUCTION: This exam was performed according to the departmental dose-optimization program which includes automated exposure control, adjustment of the mA and/or kV according to patient size and/or use of iterative reconstruction technique. COMPARISON:  CT head 03/03/2021 FINDINGS: Brain: Right hemispheric subdural fluid collection which is intermediate to low-density most likely chronic subdural hematoma. This measures up to 8 mm in thickness. Mixed density left hemispheric subdural fluid collection with portions of intermediate density compatible with chronic subdural hematoma. This collection measures up to 7 mm in thickness. Ventricle size normal. No midline shift. No acute infarct or mass lesion. Empty sella noted. Vascular: Negative for hyperdense vessel Skull: Negative Sinuses/Orbits: Paranasal sinuses clear. Bilateral cataract extraction Other: None IMPRESSION: Bilateral chronic subdural hematomas. No acute hemorrhage. No midline shift. Negative for acute infarct. Electronically Signed   By: Franchot Gallo M.D.   On: 07/12/2021 16:02   US Abdomen Complete  Result Date: 07/03/2021 CLINICAL DATA:  Light chain amyloidosis.  Elevated LFTs. EXAM: ABDOMEN ULTRASOUND COMPLETE COMPARISON:  Ultrasound 03/03/2021, MRI 04/06/2021 FINDINGS: Gallbladder: Physiologically distended containing multiple intraluminal gallstones. Mild gallbladder wall thickening measuring 3-4 mm, nonspecific in the presence of ascites. No sonographic Murphy sign noted by  sonographer. Common bile duct: Diameter: 4 mm, normal. Liver: Heterogeneous and coarsened hepatic parenchymal echogenicity, mildly increased. Slight capsular nodularity. 2 x 1.4 x 1.5 cm septated cyst in the left lobe, also seen on prior MRI. No evidence of solid lesion. Portal vein is patent on color Doppler imaging with normal direction of blood flow towards the liver. IVC: No abnormality visualized. Pancreas: Cystic lesion in the pancreatic head measures 2.6 x 1.5 x 1.4 cm cyst in the pancreatic tail measures 2.4 x 1.5 x 1.4 cm. These are not significantly changed in size from intervening MRI. Pancreatic duct measures 3 mm, upper normal. Spleen: Size and appearance within normal limits. Right Kidney: Length: 9.7 cm. Normal parenchymal echogenicity. No hydronephrosis. No visualized stone or focal lesion. Left Kidney: Length: 10.4 cm. Normal parenchymal echogenicity. No hydronephrosis. No visualized stone or focal lesion. Abdominal aorta: No aneurysm visualized. Other findings: Small volume ascites primarily in the right upper quadrant. IMPRESSION: 1. Coarsened heterogeneous hepatic parenchyma with nodular contour suggestive of cirrhosis. 2. Septated cyst in the left hepatic lobe.  No solid lesion. 3. Stable sonographic appearance of cysts in the pancreas head and tail which have been assessed on MRI, for which MRI or CT follow-up was recommended. 4. Small volume abdominal ascites. 5. Gallstones. Mild gallbladder wall thickening is nonspecific in the presence of ascites, likely reactive. Electronically Signed   By: Keith Rake M.D.   On: 07/03/2021 15:40   ECHOCARDIOGRAM COMPLETE  Result Date: 06/28/2021    ECHOCARDIOGRAM REPORT  Patient Name:   NEFERTARI REBMAN Date of Exam: 06/28/2021 Medical Rec #:  [id number]         Height:       63.5 in Accession #:    [id number]        Weight:       122.5 lb Date of Birth:  11/27/1945          BSA:          1.579 m Patient Age:    54 years          BP:            138/64 mmHg Patient Gender: F                 HR:           70 bpm. Exam Location:  [place] Procedure: 2D Echo, Cardiac Doppler, Color Doppler and Strain Analysis Indications:    E85.81 Light chain amylodiosis; R60.0 Lower extremity edema  History:        Patient has prior history of Echocardiogram examinations, most                 recent 06/01/2020. Amyloidosis, Signs/Symptoms:Edema; Risk                 Factors:Non-Smoker, Dyslipidemia and h/o chemotherapy.  Sonographer:    Pilar Jarvis RDMS, RVT, RDCS Referring Phys: [place]  1. Left ventricular ejection fraction, by estimation, is 50 to 55%. The left ventricle has low normal function. The left ventricle has no regional wall motion abnormalities. There is mild left ventricular hypertrophy of the basal-septal segment. Left ventricular diastolic parameters are consistent with Grade II diastolic dysfunction (pseudonormalization). The average left ventricular global longitudinal strain is -12.6 %. The global longitudinal strain is abnormal.  2. Right ventricular systolic function is low normal. The right ventricular size is normal.  3. The mitral valve is normal in structure. Mild mitral valve regurgitation.  4. The aortic valve is tricuspid. Aortic valve regurgitation is not visualized. Aortic valve sclerosis is present, with no evidence of aortic valve stenosis. Comparison(s): Previous GLS -16.8. FINDINGS  Left Ventricle: Left ventricular ejection fraction, by estimation, is 50 to 55%. The left ventricle has low normal function. The left ventricle has no regional wall motion abnormalities. The average left ventricular global longitudinal strain is -12.6 %. The global longitudinal strain is abnormal. The left ventricular internal cavity size was normal in size. There is mild left ventricular hypertrophy of the basal-septal segment. Left ventricular diastolic parameters are consistent with Grade II diastolic dysfunction  (pseudonormalization). Right Ventricle: The right ventricular size is normal. No increase in right ventricular wall thickness. Right ventricular systolic function is low normal. Left Atrium: Left atrial size was normal in size. Right Atrium: Right atrial size was normal in size. Pericardium: There is no evidence of pericardial effusion. Mitral Valve: The mitral valve is normal in structure. Mild mitral valve regurgitation. Tricuspid Valve: The tricuspid valve is normal in structure. Tricuspid valve regurgitation is not demonstrated. Aortic Valve: The aortic valve is tricuspid. Aortic valve regurgitation is not visualized. Aortic valve sclerosis is present, with no evidence of aortic valve stenosis. Aortic valve mean gradient measures 5.0 mmHg. Aortic valve peak gradient measures 9.2  mmHg. Aortic valve area, by VTI measures 1.48 cm. Pulmonic Valve: The pulmonic valve was normal in structure. Pulmonic valve regurgitation is trivial. Aorta: The aortic root and ascending aorta are structurally normal, with no evidence of dilitation. Venous:  The inferior vena cava was not well visualized. IAS/Shunts: No atrial level shunt detected by color flow Doppler.  LEFT VENTRICLE PLAX 2D LVIDd:         5.30 cm      Diastology LVIDs:         4.00 cm      LV e' medial:    4.13 cm/s LV PW:         1.40 cm      LV E/e' medial:  15.4 LV IVS:        1.40 cm      LV e' lateral:   5.66 cm/s LVOT diam:     1.70 cm      LV E/e' lateral: 11.2 LV SV:         48 LV SV Index:   30           2D Longitudinal Strain LVOT Area:     2.27 cm     2D Strain GLS Avg:     -12.6 %  LV Volumes (MOD) LV vol d, MOD A4C: 106.0 ml LV vol s, MOD A4C: 57.4 ml LV SV MOD A4C:     106.0 ml RIGHT VENTRICLE RV Basal diam:  3.30 cm RV S prime:     13.30 cm/s TAPSE (M-mode): 2.2 cm LEFT ATRIUM             Index        RIGHT ATRIUM           Index LA diam:        4.50 cm 2.85 cm/m   RA Area:     12.00 cm LA Vol (A2C):   45.7 ml 28.94 ml/m  RA Volume:   29.20 ml   18.49 ml/m LA Vol (A4C):   44.0 ml 27.87 ml/m LA Biplane Vol: 45.5 ml 28.82 ml/m  AORTIC VALVE                     PULMONIC VALVE AV Area (Vmax):    1.57 cm      PV Vmax:       0.94 m/s AV Area (Vmean):   1.60 cm      PV Peak grad:  3.5 mmHg AV Area (VTI):     1.48 cm AV Vmax:           151.33 cm/s AV Vmean:          104.000 cm/s AV VTI:            0.325 m AV Peak Grad:      9.2 mmHg AV Mean Grad:      5.0 mmHg LVOT Vmax:         104.40 cm/s LVOT Vmean:        73.200 cm/s LVOT VTI:          0.212 m LVOT/AV VTI ratio: 0.65  AORTA Ao Root diam: 2.90 cm Ao Asc diam:  2.85 cm MITRAL VALVE               TRICUSPID VALVE MV Area (PHT): 3.45 cm    TR Peak grad:   27.0 mmHg MV Decel Time: 220 msec    TR Vmax:        260.00 cm/s MV E velocity: 63.40 cm/s MV A velocity: 79.70 cm/s  SHUNTS MV E/A ratio:  0.80        Systemic VTI:  0.21 m  Systemic Diam: 1.70 cm Kate Sable MD Electronically signed by Kate Sable MD Signature Date/Time: 06/28/2021/6:27:53 PM    Final        ASSESSMENT & PLAN:  1. Light chain (AL) amyloidosis (HCC)   2. Encounter for antineoplastic chemotherapy   3. Subdural hematoma ([place])   4. Iron deficiency anemia due to chronic blood loss   5. 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 Hyperbilirubinemia   7. 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[place])   8. Hypokalemia   9. Pancreatic cyst     # AL-Amyloidosis Liver involvement, possible kidney involvement-on second line treatment with daratumumab Revlimid dexamethasone. Labs are reviewed and discussed with patient. Amyloidosis labs-light chain levels, M protein level stable.  Proceed with Daratumumab today. She will start Revlimid D1-21.    Chronic liver impairment secondary to amyloidosis liver involvement Liver function has not dramatically improved.  07/03/2021, ultrasound abdomen showed stable findings.  Small volume abdominal ascites. Slightly increased PT, normal PTT, adequate fibrinogen  level. Bilirubin level remains high.  She is off aspirin 81 mg currently due to increased fall risk/chronic subdural hematoma.    # diarrhea, chemotherapy-induced .  Resolved.  # Skin itchiness, Atarax PRN.  #Falls, continue physical therapy  #Iron deficiency anemia due to ongoing vaginal blood loss. Previously received IV iron treatments and blood transfusion for Hemoglobin stable above 10, continue oral iron supplementation.  # Uncontrolled DM, weekly Dexamethasone use.  Continue Lantus and sliding scale per endocrinology recommendation.  #Hypokalemia, potassium is stable today.  continue potassium chloride 28mq twice daily  #Cystic pancreatic cyst, MRI findings were reviewed and discussed with patient.Possible pseudocyst, less likely cystic pancreatic neoplasm.  Given her poor prognosis from amyloidosis, recommend observation.  #Malnutrition, weight loss, hypoalbuminemia Follow up with nutritionist.  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  Monitor.  #Goals of care, prognosis is poor.  With mean survival time of 6 to 8 months.  Patient has been on treatment for over a year.  Recommend follow-up with palliative care service.  Plan was discussed with patient, her daughter We spent sufficient time to discuss many aspect of care, questions were answered to patient's satisfaction.  Lab MD Daratumumab in 4 weeks  cc FLeonel Ramsay MD   ZEarlie Server MD, PhD  09/18/2021

## 2021-09-18 NOTE — Progress Notes (Signed)
Pt here for follow up. No new concerns voiced.   

## 2021-09-18 NOTE — Patient Instructions (Signed)
[facility]  Discharge Instructions: ?Thank you for choosing [facility] to provide your oncology and hematology care.  ?If you have a lab appointment with the Cancer Center, please go directly to the Cancer Center and check in at the registration area. ? ?Wear comfortable clothing and clothing appropriate for easy access to any Portacath or PICC line.  ? ?We strive to give you quality time with your provider. You may need to reschedule your appointment if you arrive late (15 or more minutes).  Arriving late affects you and other patients whose appointments are after yours.  Also, if you miss three or more appointments without notifying the office, you may be dismissed from the clinic at the provider?s discretion.    ?  ?For prescription refill requests, have your pharmacy contact our office and allow 72 hours for refills to be completed.   ? ?  ?To help prevent nausea and vomiting after your treatment, we encourage you to take your nausea medication as directed. ? ?BELOW ARE SYMPTOMS THAT SHOULD BE REPORTED IMMEDIATELY: ?*FEVER GREATER THAN 100.4 F (38 ?C) OR HIGHER ?*CHILLS OR SWEATING ?*NAUSEA AND VOMITING THAT IS NOT CONTROLLED WITH YOUR NAUSEA MEDICATION ?*UNUSUAL SHORTNESS OF BREATH ?*UNUSUAL BRUISING OR BLEEDING ?*URINARY PROBLEMS (pain or burning when urinating, or frequent urination) ?*BOWEL PROBLEMS (unusual diarrhea, constipation, pain near the anus) ?TENDERNESS IN MOUTH AND THROAT WITH OR WITHOUT PRESENCE OF ULCERS (sore throat, sores in mouth, or a toothache) ?UNUSUAL RASH, SWELLING OR PAIN  ?UNUSUAL VAGINAL DISCHARGE OR ITCHING  ? ?Items with * indicate a potential emergency and should be followed up as soon as possible or go to the Emergency Department if any problems should occur. ? ?Please show the CHEMOTHERAPY ALERT CARD or IMMUNOTHERAPY ALERT CARD at check-in to the Emergency Department and triage nurse. ? ?Should you have questions after your visit  or need to cancel or reschedule your appointment, please contact [facility]  [phone] and follow the prompts.  Office hours are 8:00 a.m. to 4:30 p.m. Monday - Friday. Please note that voicemails left after 4:00 p.m. may not be returned until the following business day.  We are closed weekends and major holidays. You have access to a nurse at all times for urgent questions. Please call the main number to the clinic [phone] and follow the prompts. ? ?For any non-urgent questions, you may also contact your provider using MyChart. We now offer e-Visits for anyone 18 and older to request care online for non-urgent symptoms. For details visit [email or web address]. ?  ?Also download the MyChart app! Go to the app store, search "MyChart", open the app, select [facility], and log in with your MyChart username and password. ? ?Due to Covid, a mask is required upon entering the hospital/clinic. If you do not have a mask, one will be given to you upon arrival. For doctor visits, patients may have 1 support person aged 18 or older with them. For treatment visits, patients cannot have anyone with them due to current Covid guidelines and our immunocompromised population.  ?

## 2021-09-19 ENCOUNTER — Other Ambulatory Visit: Payer: Self-pay

## 2021-09-19 ENCOUNTER — Emergency Department
Admission: EM | Admit: 2021-09-19 | Discharge: 2021-09-19 | Disposition: A | Discharge status: Home / Self Care | Payer: Medicare Other | Attending: Emergency Medicine | Admitting: Emergency Medicine

## 2021-09-19 ENCOUNTER — Emergency Department: Payer: Medicare Other

## 2021-09-19 DIAGNOSIS — S0990XA Unspecified injury of head, initial encounter: Secondary | ICD-10-CM | POA: Diagnosis present

## 2021-09-19 DIAGNOSIS — M1731 Unilateral post-traumatic osteoarthritis, right knee: Secondary | ICD-10-CM | POA: Diagnosis not present

## 2021-09-19 DIAGNOSIS — M1711 Unilateral primary osteoarthritis, right knee: Secondary | ICD-10-CM

## 2021-09-19 DIAGNOSIS — M79604 Pain in right leg: Secondary | ICD-10-CM

## 2021-09-19 DIAGNOSIS — N189 Chronic kidney disease, unspecified: Secondary | ICD-10-CM | POA: Insufficient documentation

## 2021-09-19 DIAGNOSIS — E1122 Type 2 diabetes mellitus with diabetic chronic kidney disease: Secondary | ICD-10-CM | POA: Diagnosis not present

## 2021-09-19 DIAGNOSIS — I130 Hypertensive heart and chronic kidney disease with heart failure and stage 1 through stage 4 chronic kidney disease, or unspecified chronic kidney disease: Secondary | ICD-10-CM | POA: Diagnosis not present

## 2021-09-19 DIAGNOSIS — W010XXA Fall on same level from slipping, tripping and stumbling without subsequent striking against object, initial encounter: Secondary | ICD-10-CM | POA: Insufficient documentation

## 2021-09-19 DIAGNOSIS — I509 Heart failure, unspecified: Secondary | ICD-10-CM | POA: Insufficient documentation

## 2021-09-19 LAB — URINALYSIS, ROUTINE W REFLEX MICROSCOPIC
Bacteria, UA: NONE SEEN
Bilirubin Urine: NEGATIVE
Glucose, UA: 50 mg/dL — AB
Hgb urine dipstick: NEGATIVE
Ketones, ur: NEGATIVE mg/dL
Leukocytes,Ua: NEGATIVE
Nitrite: NEGATIVE
Protein, ur: >=300 mg/dL — AB
Specific Gravity, Urine: 1.022 (ref 1.005–1.030)
pH: 5 (ref 5.0–8.0)

## 2021-09-19 LAB — KAPPA/LAMBDA LIGHT CHAINS
Kappa free light chain: 15.9 mg/L (ref 3.3–19.4)
Kappa, lambda light chain ratio: 2.15 — ABNORMAL HIGH (ref 0.26–1.65)
Lambda free light chains: 7.4 mg/L (ref 5.7–26.3)

## 2021-09-19 LAB — BETA 2 MICROGLOBULIN, SERUM: Beta-2 Microglobulin: 2.7 mg/L — ABNORMAL HIGH (ref 0.6–2.4)

## 2021-09-19 IMAGING — CT CT HEAD W/O CM
4 series · 17 of 47 positions shown, 19 images · non-contrast
Comparison: [DATE]

CLINICAL DATA: Head trauma, moderate-severe



[Series 2: head wo · axial · 0.43mm/px · z∈[-53,+67]mm · 7 of 33 slices shown, 9 images]
[im 5/33  brain]
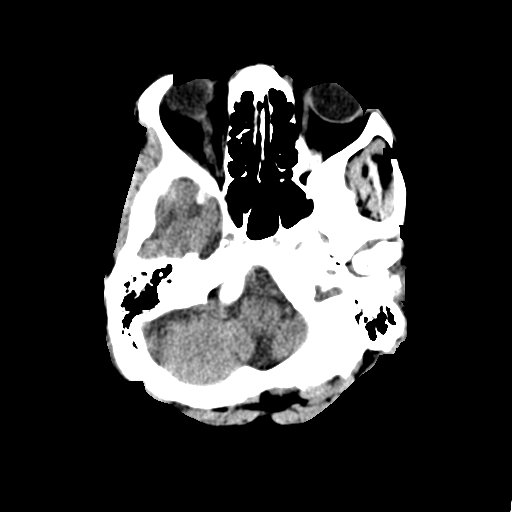
[im 5/33  bone]
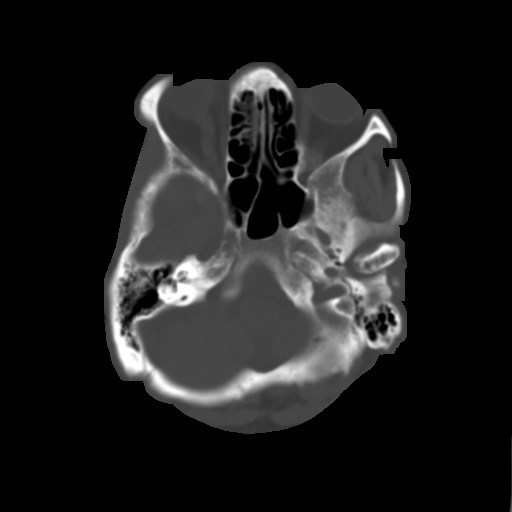
[im 9/33  brain]
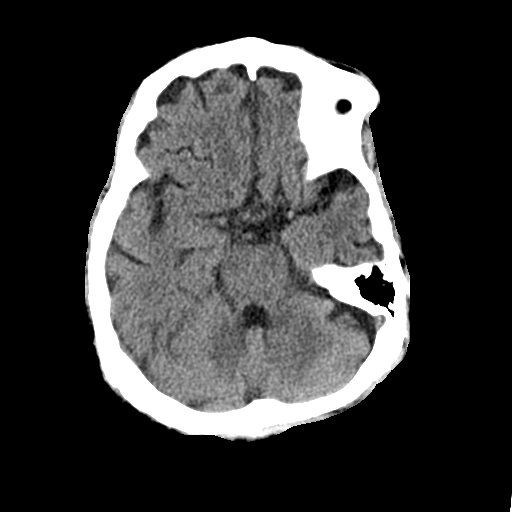
[im 13/33  brain]
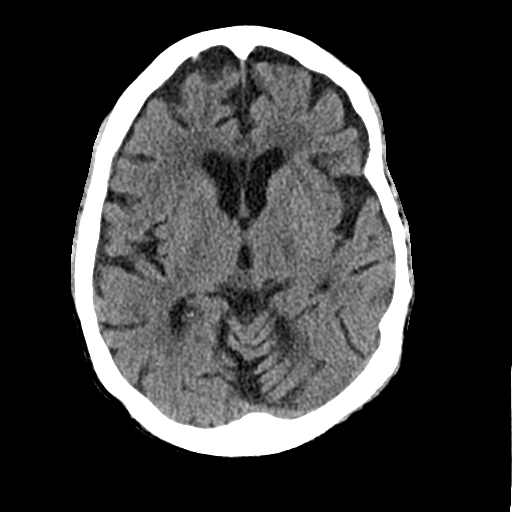
[im 17/33  brain]
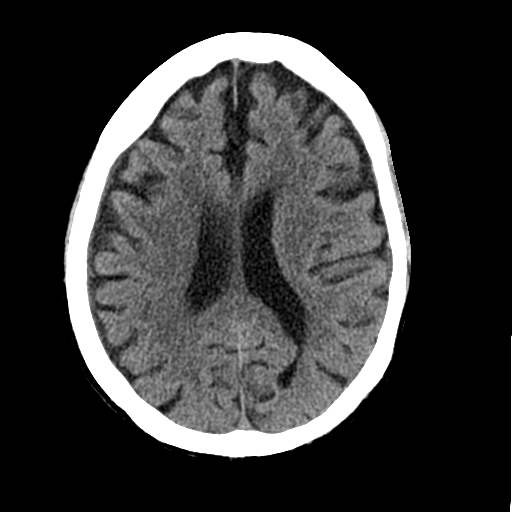
[im 21/33  brain]
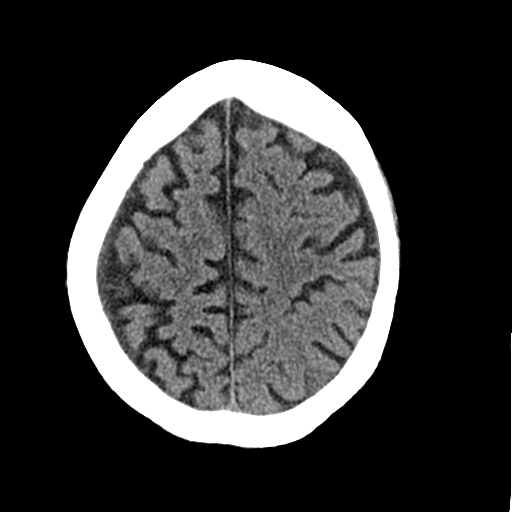
[im 21/33  bone]
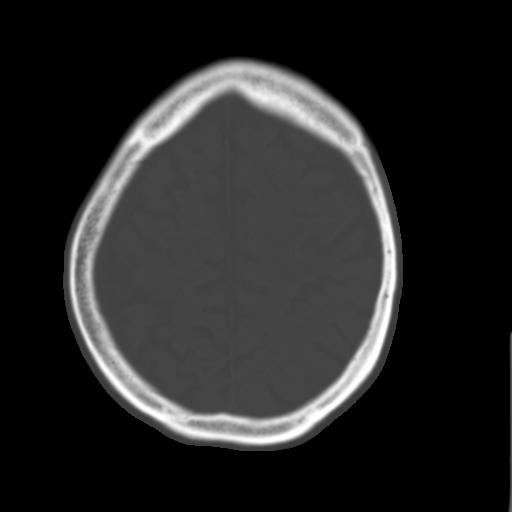
[im 25/33  brain]
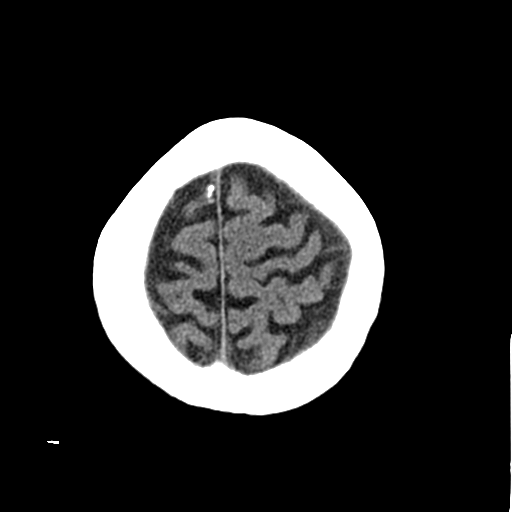
[im 29/33  brain]
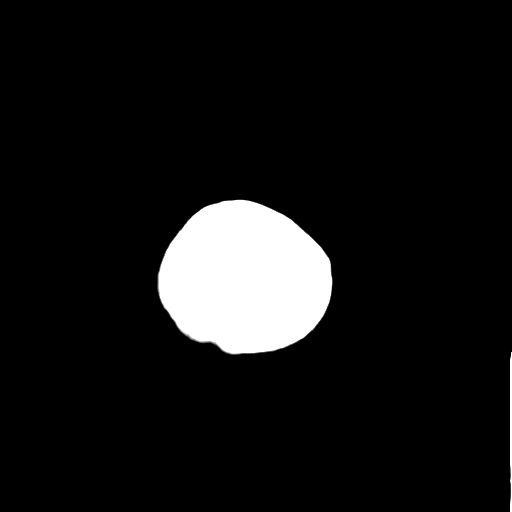

[Series 3: head bone · axial · 0.43mm/px · z∈[-57,-1]mm · 4 of 82 slices shown]
[im 9/82  bone]
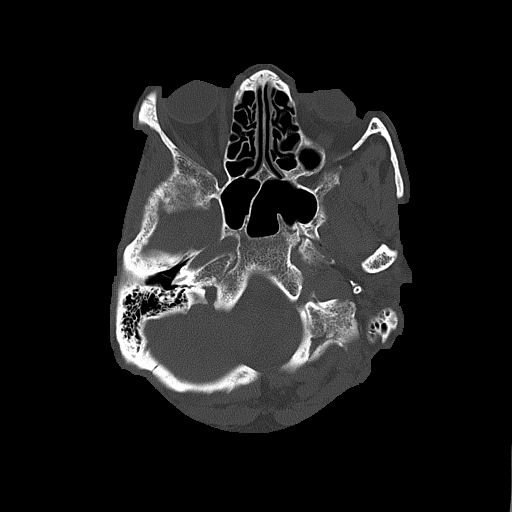
[im 17/82  bone]
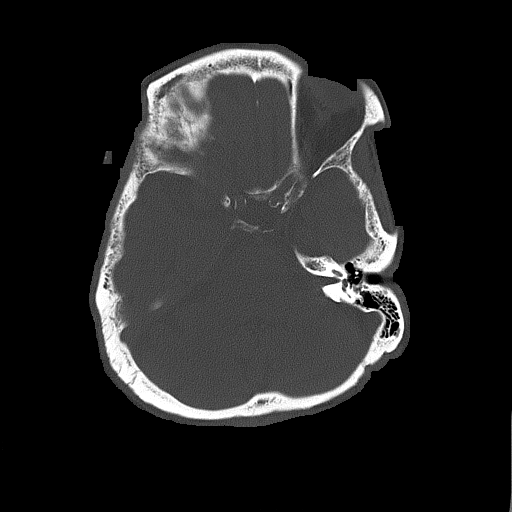
[im 25/82  bone]
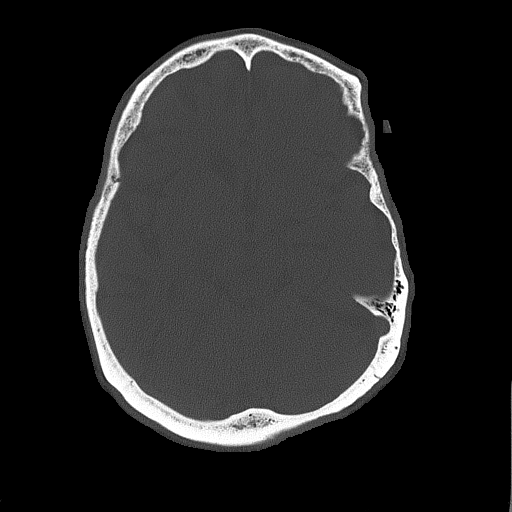
[im 37/82  bone]
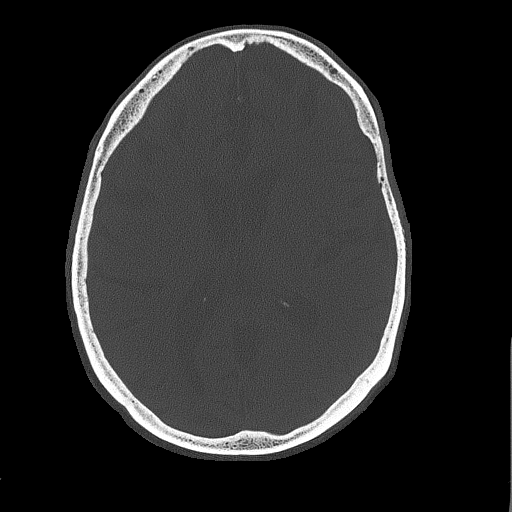

[Series 4: coronal soft tissue · coronal · 0.31mm/px · 3 of 64 slices shown]
[im 22/64  brain]
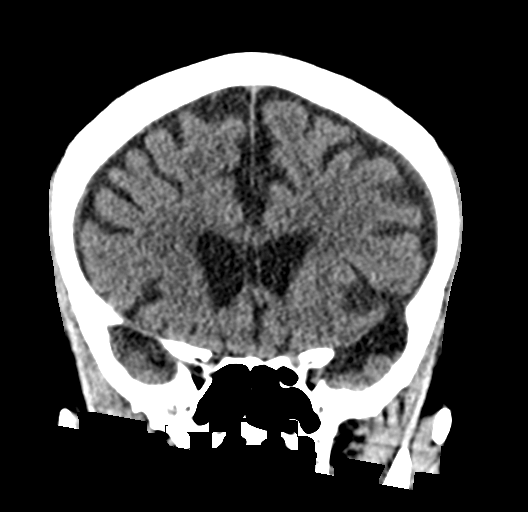
[im 29/64  brain]
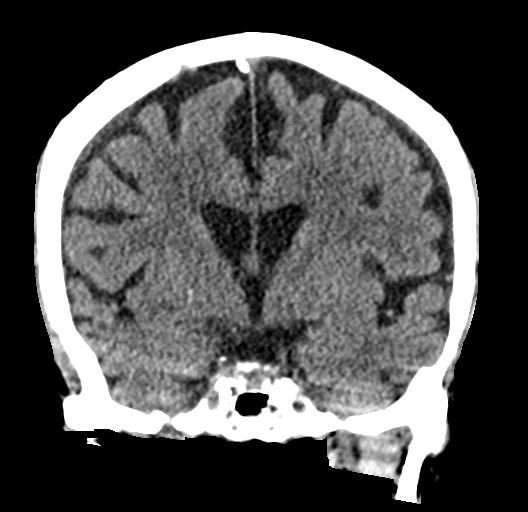
[im 36/64  brain]
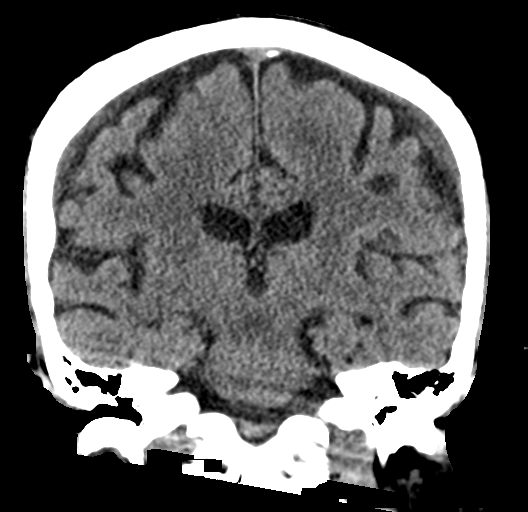

[Series 5: sagittal soft tissue · sagittal · 0.31mm/px · 3 of 55 slices shown]
[im 22/55  brain]
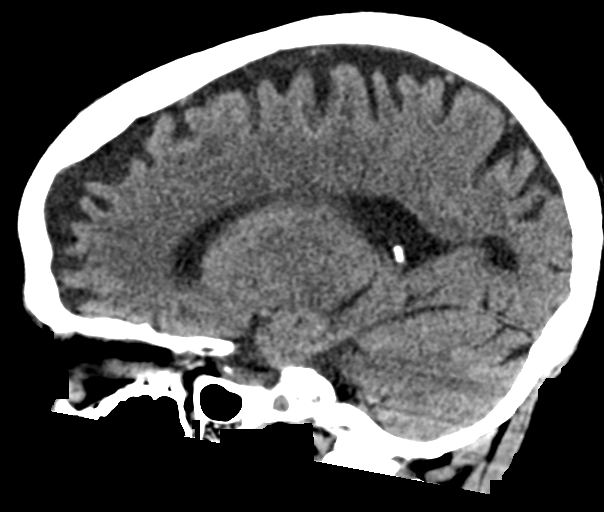
[im 28/55  brain]
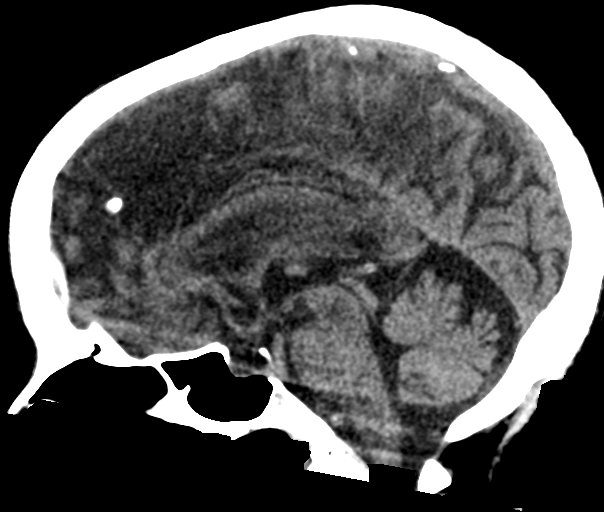
[im 34/55  brain]
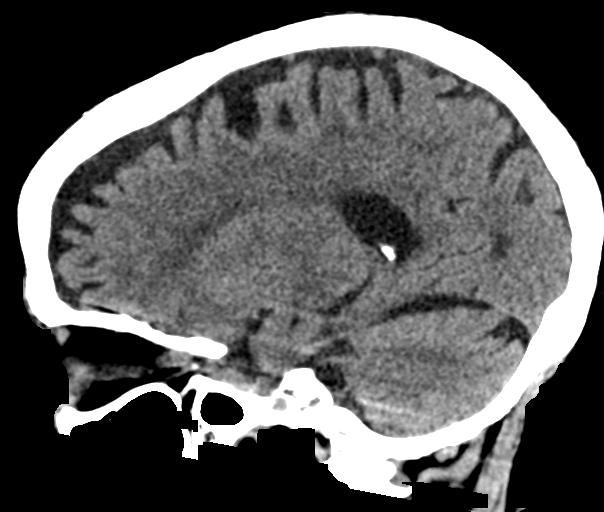

[17 of 47 positions shown; findings below may reference images not displayed]

FINDINGS: Brain: Small chronic bilateral subdural hematomas are stable in size
from prior measuring approximately 5 mm on the right and 3 mm on the
left. No significant mass effect or midline shift. No evidence of
acute infarction, hydrocephalus, or mass lesion. Patchy low-density
changes within the periventricular and subcortical white matter
compatible with chronic microvascular ischemic change. Moderate
diffuse cerebral volume loss.

Vascular: Atherosclerotic calcifications involving the large vessels
of the skull base. No unexpected hyperdense vessel.

Skull: Normal. Negative for fracture or focal lesion.

Sinuses/Orbits: No acute finding.

Other: None.
IMPRESSION: 1. Stable small chronic bilateral subdural hematomas.
2. No new acute intracranial findings.
3. Chronic microvascular ischemic disease and cerebral volume loss.

## 2021-09-19 IMAGING — CR DG KNEE COMPLETE 4+V*R*
4 series · 4 of 4 positions shown · non-contrast
Comparison: None Available.

CLINICAL DATA: Patient tripped yesterday now with thigh pain.

EXAM:
RIGHT KNEE - COMPLETE 4+ VIEW

[knee ap]
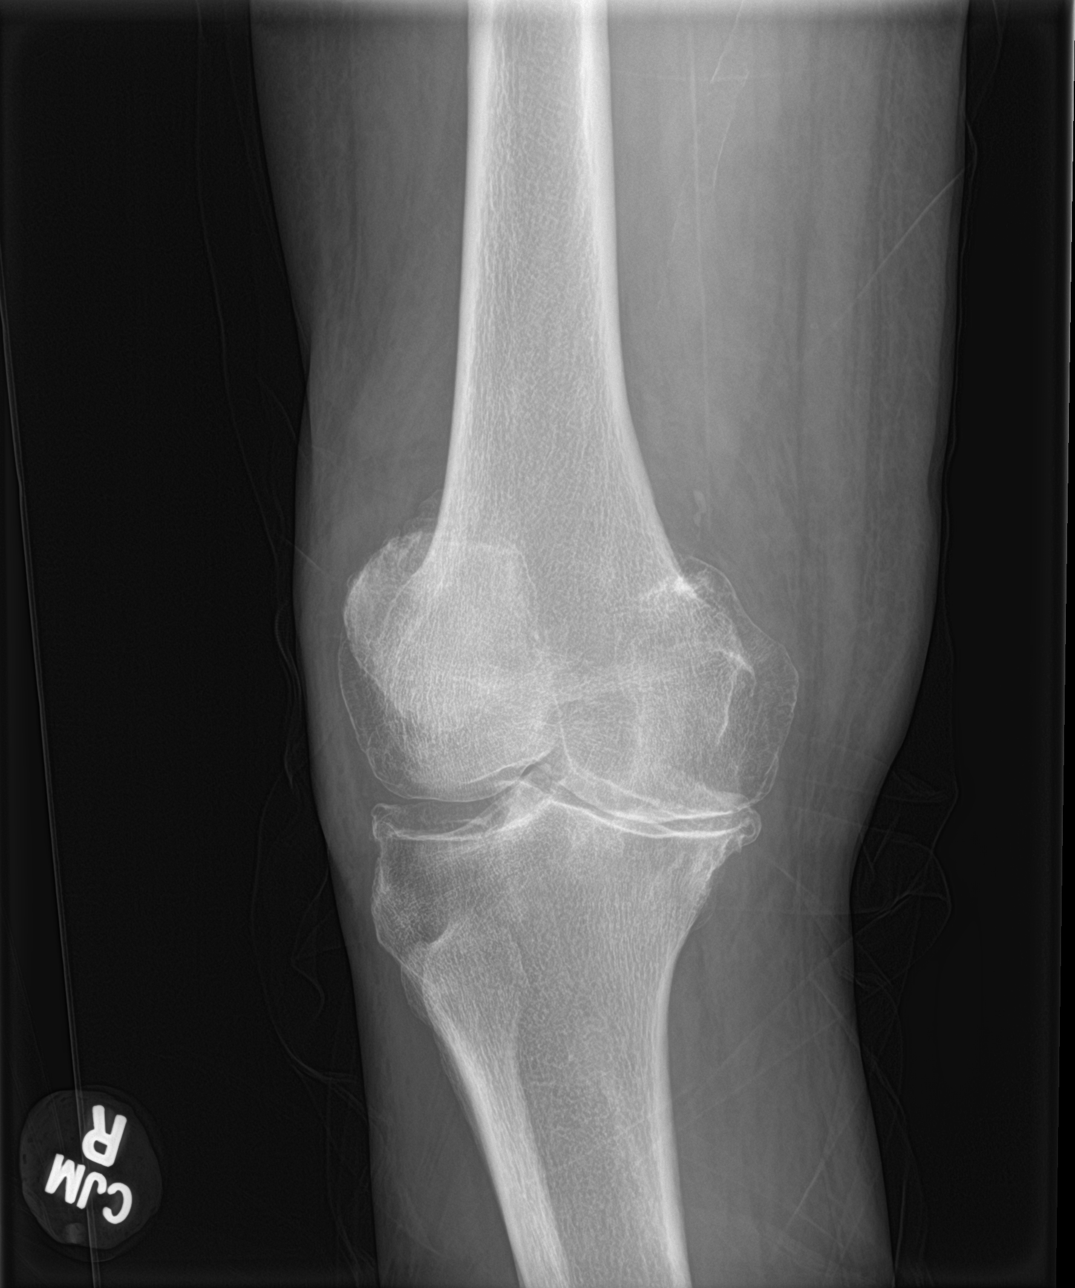

[knee obl (1 of 2)]
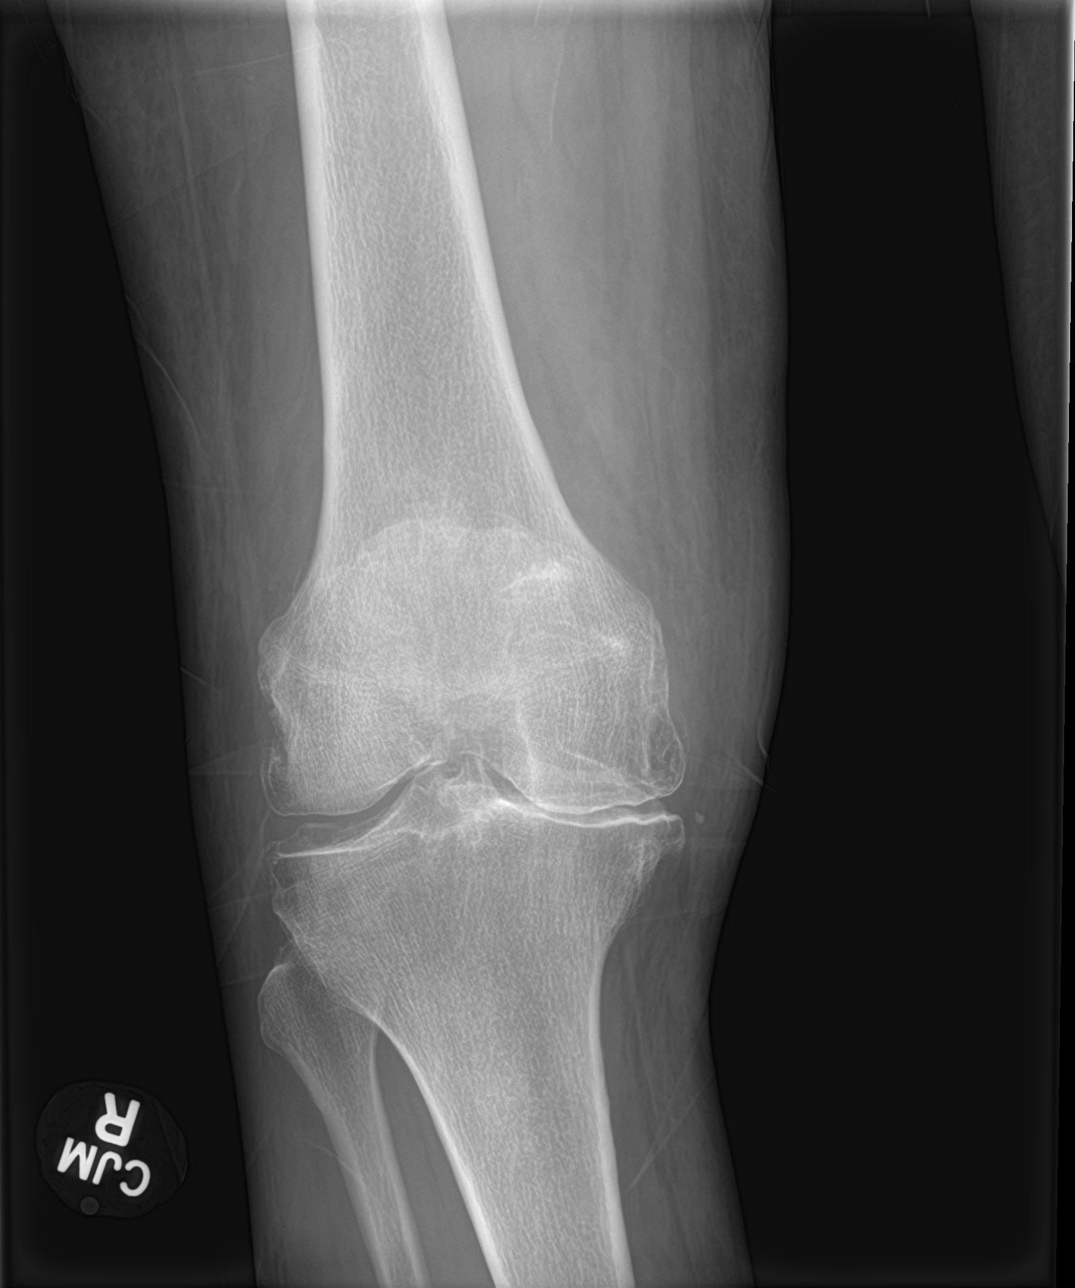

[knee lat]
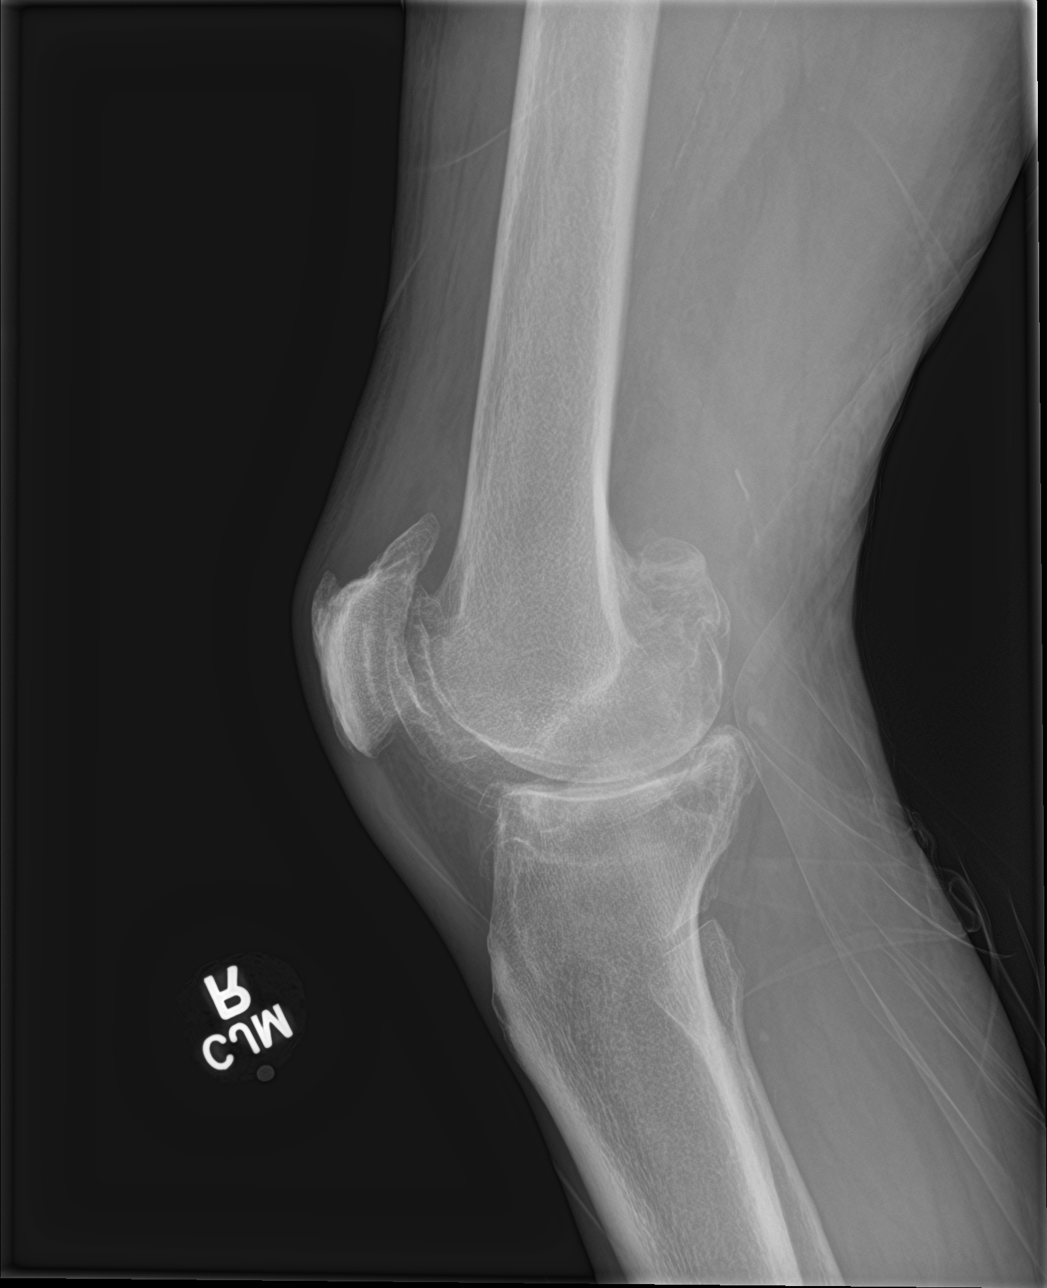

[knee obl (2 of 2)]
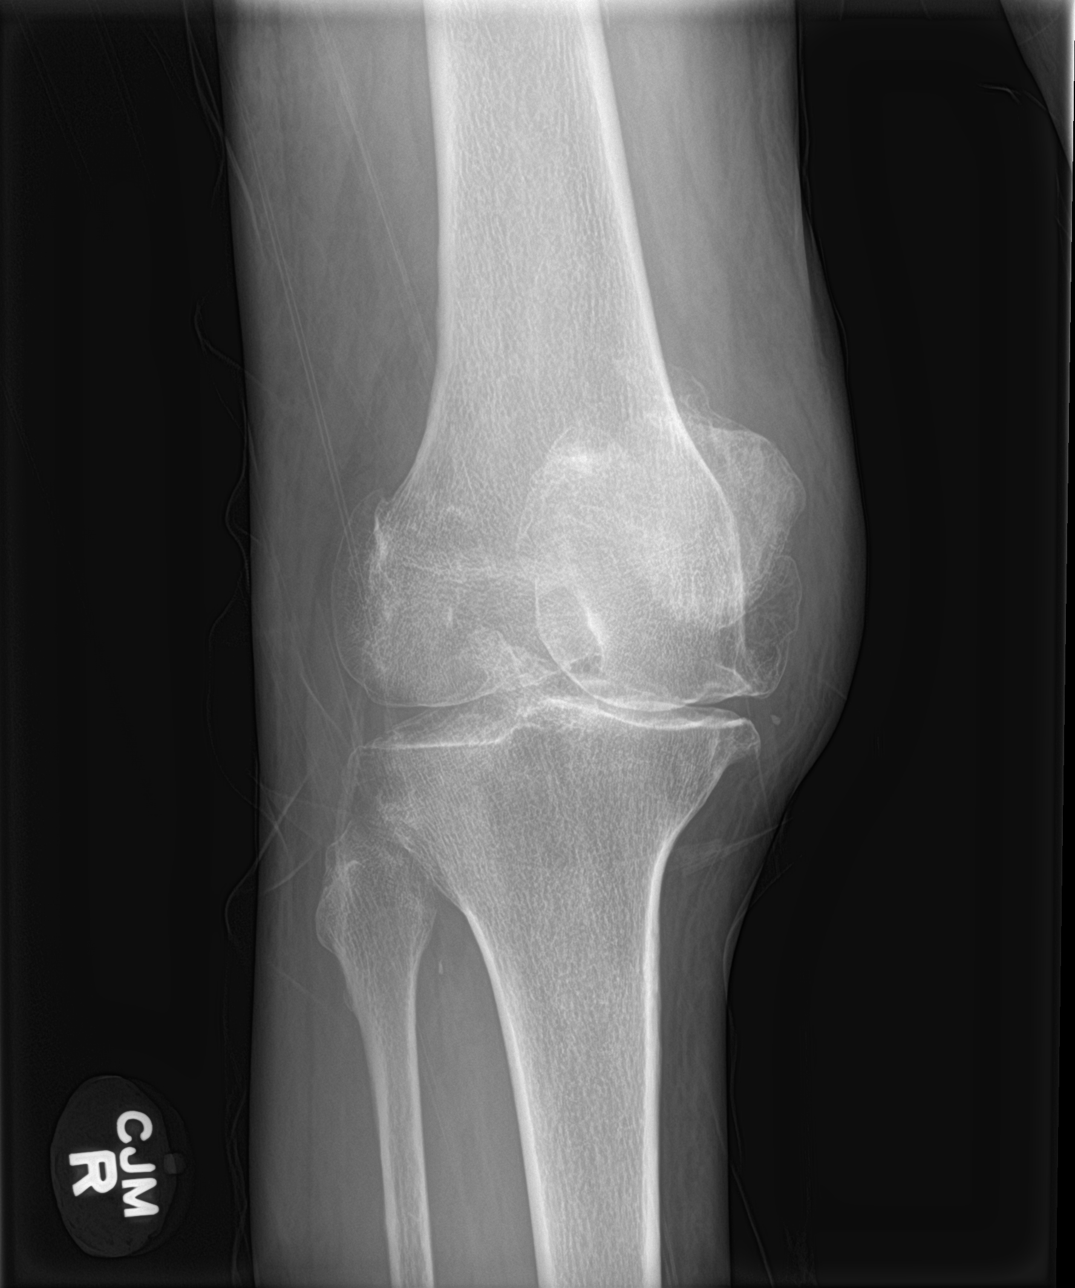

[4 of 4 positions shown; findings below may reference images not displayed]

FINDINGS: Four view study. No evidence for an acute fracture. No subluxation
or dislocation. No joint effusion. Loss of joint space noted medial
compartment with advanced hypertrophic spurring visible in all 3
compartments
IMPRESSION: 1. No acute bony abnormality. No joint effusion.
2. Advanced tricompartmental osteoarthritis.

## 2021-09-19 IMAGING — CR DG FEMUR 2+V*R*
4 series · 4 of 4 positions shown · non-contrast
Comparison: No comparison studies available.

CLINICAL DATA: Right thigh pain after a fall.

EXAM:
RIGHT FEMUR 2 VIEWS

[femur ap (1 of 2)]
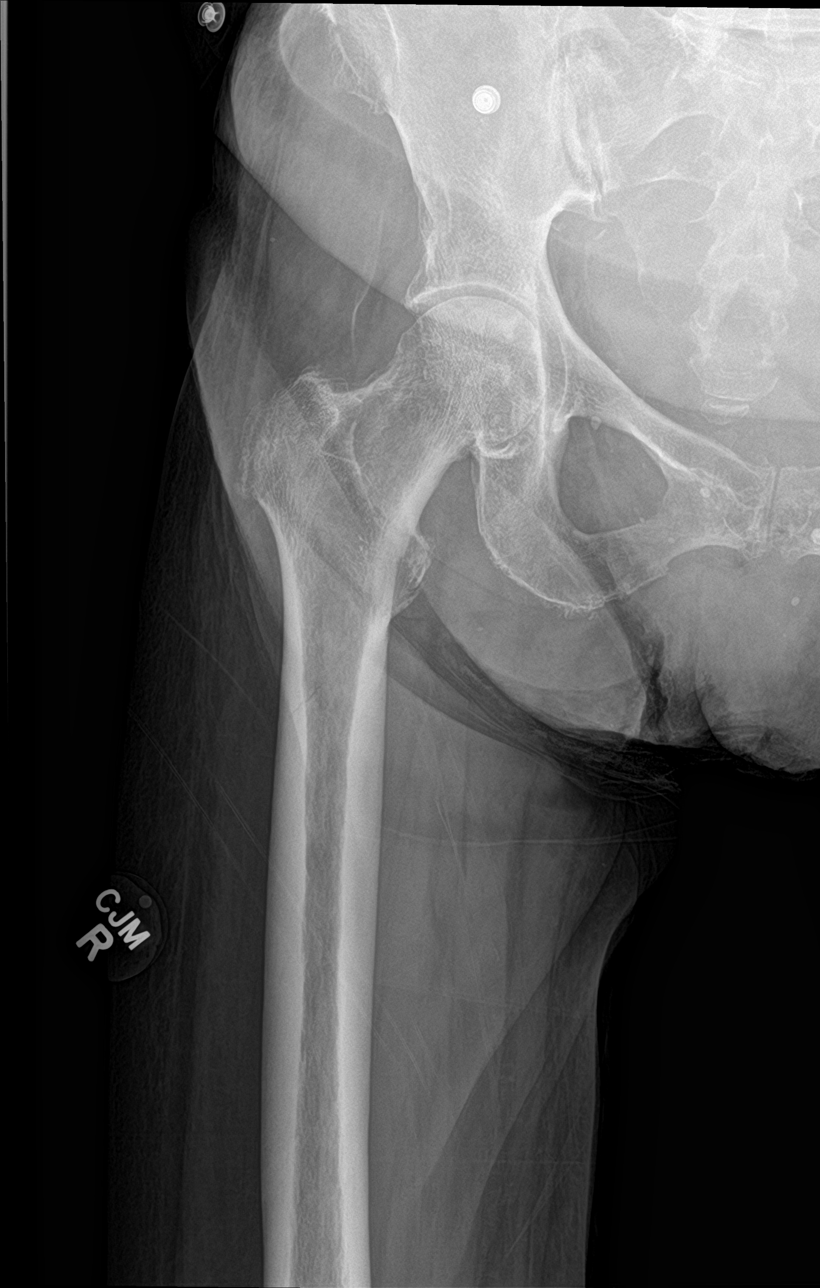

[femur ap (2 of 2)]
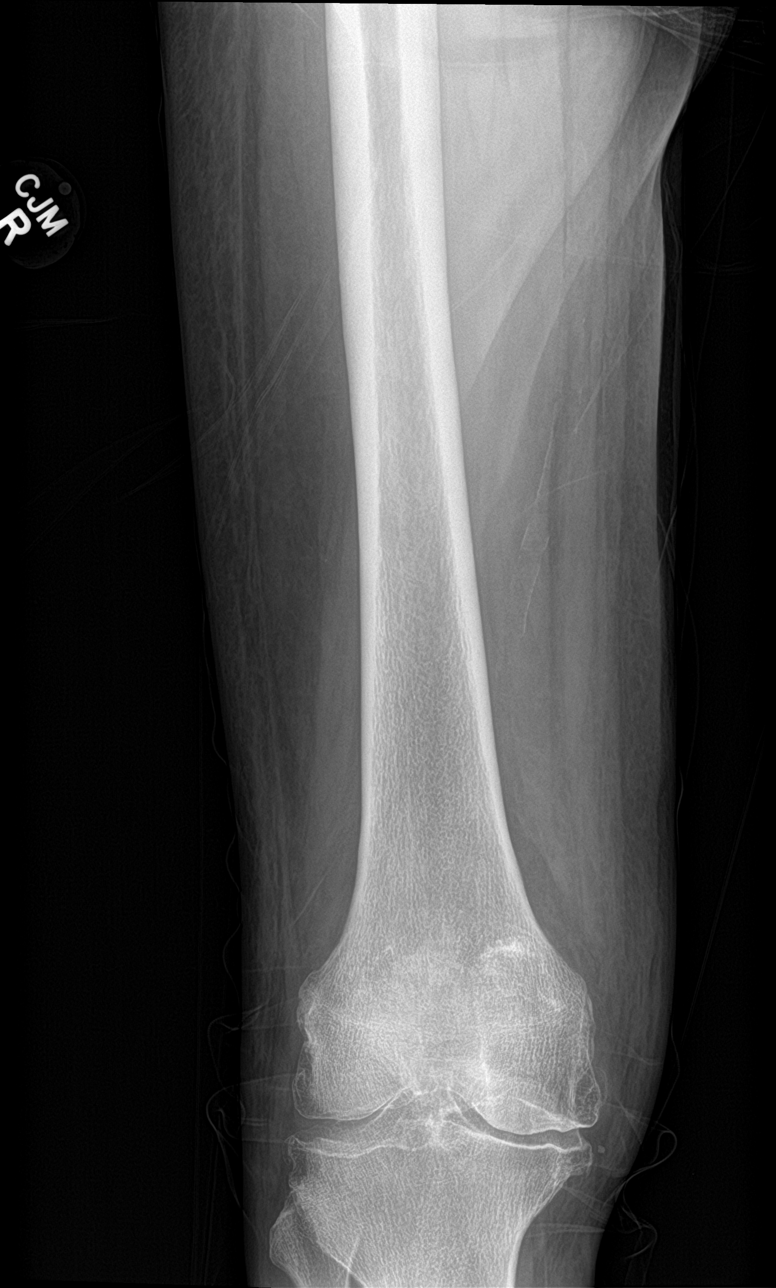

[femur lat (1 of 2)]
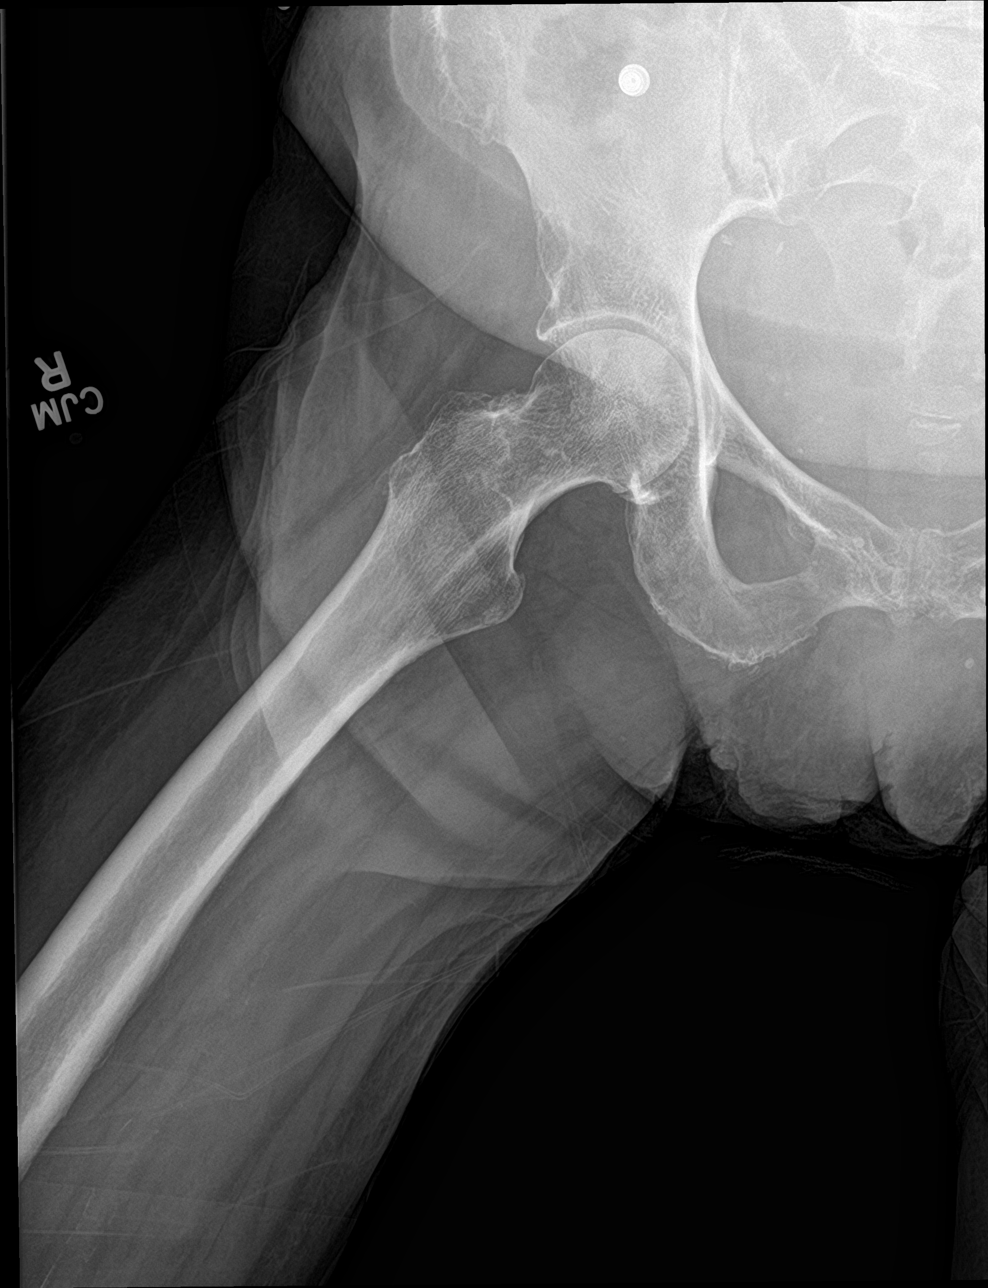

[femur lat (2 of 2)]
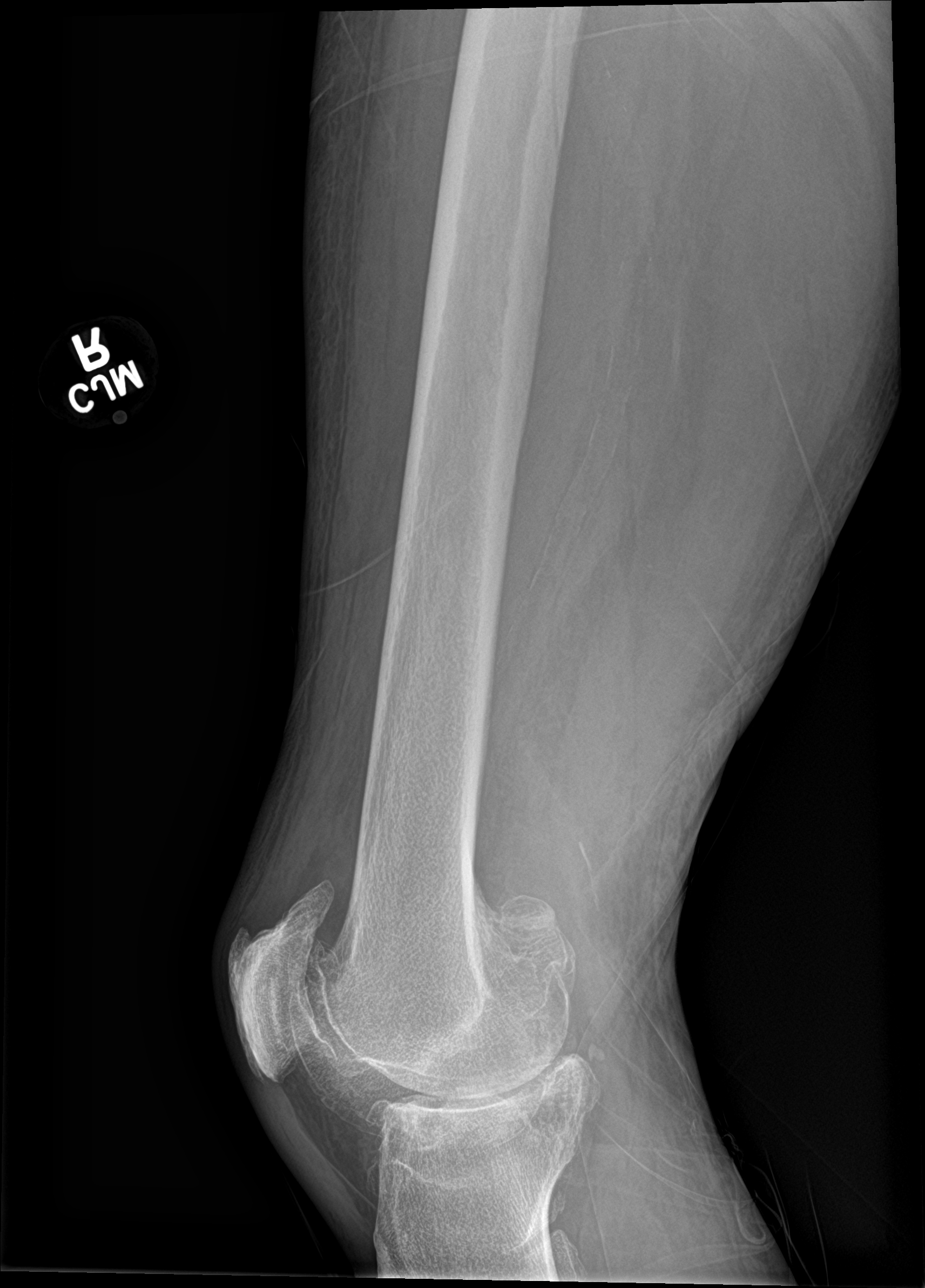

[4 of 4 positions shown; findings below may reference images not displayed]

FINDINGS: No fracture. No worrisome lytic or sclerotic osseous abnormality.
Degenerative changes noted at the knee.
IMPRESSION: No acute bony abnormality.

## 2021-09-19 MED ORDER — OXYCODONE-ACETAMINOPHEN 5-325 MG PO TABS
1.0000 | ORAL_TABLET | Freq: Once | ORAL | Status: AC
  Administered 2021-09-19: 1 via ORAL
  Filled 2021-09-19: qty 1

## 2021-09-19 MED ORDER — OXYCODONE-ACETAMINOPHEN 5-325 MG PO TABS: 1.0000 | ORAL_TABLET | Freq: Four times a day (QID) | ORAL | 0 refills | Status: DC | PRN

## 2021-09-19 NOTE — ED Notes (Signed)
See triage note  presents s/p trip and fall  states she is having pain to right knee and upper leg  also having some dark urine  denies any dysuria

## 2021-09-19 NOTE — ED Triage Notes (Signed)
First Nurse Note:  C/O right thigh pain.  Per [facility] report, patient tripped last night over a stationary bike.  States this morning, right thigh hurting with ambulation.  Also EMS reports slight confusion and dark urine x 2-3 days., possible UTI  VS wnl

## 2021-09-19 NOTE — ED Notes (Signed)
Attempted to ambulated with walker  she able to stand  but she is dragging her left leg  Dr Charna Archer aware

## 2021-09-19 NOTE — ED Provider Notes (Signed)
[facility] Provider Note    Event Date/Time   First MD Initiated Contact with Patient 09/19/21 1151     (approximate)   History   Chief Complaint Leg Pain   HPI  Nancy Rodriguez is a 76 y.o. female with past medical history of hypertension, hyperlipidemia, diabetes, CKD, CHF, rheumatoid arthritis, anemia, and amyloidosis who presents to the ED complaining of leg pain.  32 of history is obtained from patient's daughter, who states that last night patient tripped over the stand for a stationary bike, falling onto her right leg.  She primarily complains of pain in her right knee and thigh, with significant discomfort when she tries to bear weight on her right leg.  She is not sure whether she hit her head, however daughter reports that she called her by the wrong name this morning.  Patient does not take any blood thinners and daughter now feels that patient is back to her baseline.  She denies any headache, neck pain, chest pain, abdominal pain, or upper extremity pain.  Daughter states that patient typically walks with the assistance of a cane or walker.     Physical Exam   Triage Vital Signs: ED Triage Vitals  Enc Vitals Group     BP 09/19/21 1107 (!) 170/85     Pulse Rate 09/19/21 1107 93     Resp 09/19/21 1107 16     Temp 09/19/21 1107 98.4 F (36.9 C)     Temp Source 09/19/21 1107 Oral     SpO2 09/19/21 1107 99 %     Weight 09/19/21 1108 122 lb (55.3 kg)     Height 09/19/21 1151 '5\' 2"'$  (1.575 m)     Head Circumference --      Peak Flow --      Pain Score 09/19/21 1107 6     Pain Loc --      Pain Edu? --      Excl. in [place]? --     Most recent vital signs: Vitals:   09/19/21 1107  BP: (!) 170/85  Pulse: 93  Resp: 16  Temp: 98.4 F (36.9 C)  SpO2: 99%    Constitutional: Alert and oriented. Eyes: Conjunctivae are normal. Head: Atraumatic. Nose: No congestion/rhinnorhea. Mouth/Throat: Mucous membranes are moist.  Neck: No midline  cervical spine tenderness to palpation. Cardiovascular: Normal rate, regular rhythm. Grossly normal heart sounds.  2+ radial pulses bilaterally. Respiratory: Normal respiratory effort.  No retractions. Lungs CTAB.  No chest wall tenderness to palpation. Gastrointestinal: Soft and nontender. No distention. Musculoskeletal: No lower extremity tenderness nor edema.  Diffuse tenderness to palpation of right knee and right thigh with no obvious deformity.  No tenderness noted at right hip with range of motion intact.  No tenderness to palpation at right ankle or foot.  No tenderness to palpation of left lower extremity. Neurologic:  Normal speech and language. No gross focal neurologic deficits are appreciated.    ED Results / Procedures / Treatments   Labs (all labs ordered are listed, but only abnormal results are displayed) Labs Reviewed  URINALYSIS, ROUTINE W REFLEX MICROSCOPIC - Abnormal; Notable for the following components:      Result Value   Color, Urine AMBER (*)    APPearance CLEAR (*)    Glucose, UA 50 (*)    Protein, ur >=300 (*)    All other components within normal limits   RADIOLOGY X-rays of right femur and right knee reviewed and interpreted by  me with no fracture or dislocation.  PROCEDURES:  Critical Care performed: No  Procedures   MEDICATIONS ORDERED IN ED: Medications  oxyCODONE-acetaminophen (PERCOCET/ROXICET) 5-325 MG per tablet 1 tablet (1 tablet Oral Given 09/19/21 1302)     IMPRESSION / MDM / ASSESSMENT AND PLAN / ED COURSE  I reviewed the triage vital signs and the nursing notes.                              76 y.o. female with past medical history of hypertension, hyperlipidemia, diabetes, CKD, rheumatoid arthritis, CHF, anemia, and amyloidosis who presents to the ED following trip and fall last night striking her right lower extremity, now with pain upon bearing weight on right leg.  Patient's presentation is most consistent with acute complicated  illness / injury requiring diagnostic workup.  Differential diagnosis includes, but is not limited to, knee fracture, knee dislocation, hip fracture, intracranial trauma, cervical spine injury.  Patient well-appearing and in no acute distress, she is neurovascular intact to her bilateral lower extremities with no obvious deformities.  She is able to range her right knee and hip with minimal discomfort.  X-rays of right femur and knee do not show any acute injury, do show degenerative changes with osteoarthritis in the right knee.  CT head and cervical spine are negative for acute process and patient shows no signs of confusion or focal neurologic deficit on exam.  Pain improved following a dose of Percocet and she was able to ambulate with assistance of a walker, doubt occult injury of right hip and knee.  She is appropriate for discharge home with PCP follow-up, was counseled to return to the ED for new or worsening symptoms, patient and daughter agree with plan.      FINAL CLINICAL IMPRESSION(S) / ED DIAGNOSES   Final diagnoses:  Right leg pain  Osteoarthritis of right knee, unspecified osteoarthritis type     Rx / DC Orders   ED Discharge Orders          Ordered    oxyCODONE-acetaminophen (PERCOCET) 5-325 MG tablet  Every 6 hours PRN        09/19/21 1456             Note:  This document was prepared using Dragon voice recognition software and may include unintentional dictation errors.   Blake Divine, MD 09/19/21 [phone]

## 2021-09-19 NOTE — ED Triage Notes (Signed)
Pt states she tripped and fell last night and is having right mid thigh pain to the knee, denies hip pain. Daughter is concerned the pt hit her head because she called her by a different name this morning, pt is a/ox4 at present

## 2021-09-19 NOTE — ED Provider Triage Note (Signed)
  Emergency Medicine Provider Triage Evaluation Note  Nancy Rodriguez , a 76 y.o.female,  was evaluated in triage.  Pt complains of injuries sustained from fall.  She reportedly endured a mechanical fall yesterday, injuring her right thigh/knee.  She denies any head injury, however her family member states that yesterday she was calling people by the wrong names and feels like she may have hit her head as well.  Patient states that her blood pressure has been really high.   Review of Systems  Positive: Leg pain, AMS, hypertension Negative: Denies fever, chest pain, vomiting  Physical Exam   Vitals:   09/19/21 1107  BP: (!) 170/85  Pulse: 93  Resp: 16  Temp: 98.4 F (36.9 C)  SpO2: 99%   Gen:   Awake, no distress   Resp:  Normal effort  MSK:   Moves extremities without difficulty  Other:  No gross deformities to the right leg.  Pulse, motor, sensation intact distally.  Medical Decision Making  Given the patient's initial medical screening exam, the following diagnostic evaluation has been ordered. The patient will be placed in the appropriate treatment space, once one is available, to complete the evaluation and treatment. I have discussed the plan of care with the patient and I have advised the patient that an ED physician or mid-level practitioner will reevaluate their condition after the test results have been received, as the results may give them additional insight into the type of treatment they may need.    Diagnostics: Head/neck CT, femur x-ray, knee x-ray  Treatments: none immediately   Teodoro Spray, PA 09/19/21 1110

## 2021-09-20 ENCOUNTER — Telehealth: Payer: Self-pay

## 2021-09-20 NOTE — Telephone Encounter (Signed)
Spoke to [facility] and informed her that [facility] has been trying to reach them to set up delivery. She says they have a bottle of medication but that she will call to follow up.

## 2021-09-21 ENCOUNTER — Telehealth: Payer: Self-pay

## 2021-09-21 NOTE — Telephone Encounter (Signed)
Attempted to call patient to follow up on CARE. Patient was in ED Tuesday,

## 2021-09-22 LAB — MULTIPLE MYELOMA PANEL, SERUM
Albumin SerPl Elph-Mcnc: 2.7 g/dL — ABNORMAL LOW (ref 2.9–4.4)
Albumin/Glob SerPl: 1.1 (ref 0.7–1.7)
Alpha 1: 0.4 g/dL (ref 0.0–0.4)
Alpha2 Glob SerPl Elph-Mcnc: 0.8 g/dL (ref 0.4–1.0)
B-Globulin SerPl Elph-Mcnc: 1 g/dL (ref 0.7–1.3)
Gamma Glob SerPl Elph-Mcnc: 0.3 g/dL — ABNORMAL LOW (ref 0.4–1.8)
Globulin, Total: 2.5 g/dL (ref 2.2–3.9)
IgA: 109 mg/dL (ref 64–422)
IgG (Immunoglobin G), Serum: 429 mg/dL — ABNORMAL LOW (ref 586–1602)
IgM (Immunoglobulin M), Srm: 35 mg/dL (ref 26–217)
Total Protein ELP: 5.2 g/dL — ABNORMAL LOW (ref 6.0–8.5)

## 2021-09-24 ENCOUNTER — Encounter: Payer: Self-pay | Admitting: Oncology

## 2021-09-24 DIAGNOSIS — K862 Cyst of pancreas: Secondary | ICD-10-CM | POA: Insufficient documentation

## 2021-09-26 ENCOUNTER — Emergency Department: Payer: Medicare Other

## 2021-09-26 ENCOUNTER — Encounter
Admission: EM | Disposition: A | Discharge status: Skilled Nursing Facility | Payer: Self-pay | Source: Home / Self Care | Attending: Internal Medicine

## 2021-09-26 ENCOUNTER — Other Ambulatory Visit: Payer: Self-pay

## 2021-09-26 ENCOUNTER — Inpatient Hospital Stay: Payer: Medicare Other | Admitting: Certified Registered Nurse Anesthetist

## 2021-09-26 ENCOUNTER — Inpatient Hospital Stay
Admission: EM | Admit: 2021-09-26 | Discharge: 2021-10-03 | DRG: 481 | Disposition: A | Discharge status: Skilled Nursing Facility | Payer: Medicare Other | Attending: Internal Medicine | Admitting: Internal Medicine

## 2021-09-26 ENCOUNTER — Encounter: Payer: Self-pay | Admitting: Emergency Medicine

## 2021-09-26 ENCOUNTER — Inpatient Hospital Stay: Payer: Medicare Other

## 2021-09-26 DIAGNOSIS — E1122 Type 2 diabetes mellitus with diabetic chronic kidney disease: Secondary | ICD-10-CM | POA: Diagnosis present

## 2021-09-26 DIAGNOSIS — E8581 Light chain (AL) amyloidosis: Secondary | ICD-10-CM

## 2021-09-26 DIAGNOSIS — R54 Age-related physical debility: Secondary | ICD-10-CM | POA: Diagnosis present

## 2021-09-26 DIAGNOSIS — Z01818 Encounter for other preprocedural examination: Secondary | ICD-10-CM

## 2021-09-26 DIAGNOSIS — E859 Amyloidosis, unspecified: Secondary | ICD-10-CM | POA: Diagnosis present

## 2021-09-26 DIAGNOSIS — D62 Acute posthemorrhagic anemia: Secondary | ICD-10-CM | POA: Diagnosis not present

## 2021-09-26 DIAGNOSIS — N179 Acute kidney failure, unspecified: Secondary | ICD-10-CM | POA: Diagnosis not present

## 2021-09-26 DIAGNOSIS — Z9181 History of falling: Secondary | ICD-10-CM | POA: Diagnosis not present

## 2021-09-26 DIAGNOSIS — Y92003 Bedroom of unspecified non-institutional (private) residence as the place of occurrence of the external cause: Secondary | ICD-10-CM | POA: Diagnosis not present

## 2021-09-26 DIAGNOSIS — E860 Dehydration: Secondary | ICD-10-CM | POA: Diagnosis not present

## 2021-09-26 DIAGNOSIS — D631 Anemia in chronic kidney disease: Secondary | ICD-10-CM | POA: Diagnosis present

## 2021-09-26 DIAGNOSIS — D472 Monoclonal gammopathy: Secondary | ICD-10-CM | POA: Diagnosis present

## 2021-09-26 DIAGNOSIS — E7800 Pure hypercholesterolemia, unspecified: Secondary | ICD-10-CM | POA: Diagnosis present

## 2021-09-26 DIAGNOSIS — Z888 Allergy status to other drugs, medicaments and biological substances status: Secondary | ICD-10-CM

## 2021-09-26 DIAGNOSIS — W0110XA Fall on same level from slipping, tripping and stumbling with subsequent striking against unspecified object, initial encounter: Secondary | ICD-10-CM | POA: Diagnosis present

## 2021-09-26 DIAGNOSIS — Z79899 Other long term (current) drug therapy: Secondary | ICD-10-CM

## 2021-09-26 DIAGNOSIS — F05 Delirium due to known physiological condition: Secondary | ICD-10-CM | POA: Diagnosis not present

## 2021-09-26 DIAGNOSIS — S72301A Unspecified fracture of shaft of right femur, initial encounter for closed fracture: Secondary | ICD-10-CM | POA: Diagnosis present

## 2021-09-26 DIAGNOSIS — S7221XA Displaced subtrochanteric fracture of right femur, initial encounter for closed fracture: Secondary | ICD-10-CM | POA: Diagnosis present

## 2021-09-26 DIAGNOSIS — I5032 Chronic diastolic (congestive) heart failure: Secondary | ICD-10-CM | POA: Diagnosis present

## 2021-09-26 DIAGNOSIS — Z886 Allergy status to analgesic agent status: Secondary | ICD-10-CM | POA: Diagnosis not present

## 2021-09-26 DIAGNOSIS — K77 Liver disorders in diseases classified elsewhere: Secondary | ICD-10-CM

## 2021-09-26 DIAGNOSIS — Z794 Long term (current) use of insulin: Secondary | ICD-10-CM

## 2021-09-26 DIAGNOSIS — S72002A Fracture of unspecified part of neck of left femur, initial encounter for closed fracture: Secondary | ICD-10-CM | POA: Diagnosis present

## 2021-09-26 DIAGNOSIS — I130 Hypertensive heart and chronic kidney disease with heart failure and stage 1 through stage 4 chronic kidney disease, or unspecified chronic kidney disease: Secondary | ICD-10-CM | POA: Diagnosis present

## 2021-09-26 DIAGNOSIS — E43 Unspecified severe protein-calorie malnutrition: Secondary | ICD-10-CM

## 2021-09-26 DIAGNOSIS — M069 Rheumatoid arthritis, unspecified: Secondary | ICD-10-CM | POA: Diagnosis present

## 2021-09-26 DIAGNOSIS — N1831 Chronic kidney disease, stage 3a: Secondary | ICD-10-CM | POA: Diagnosis present

## 2021-09-26 DIAGNOSIS — E119 Type 2 diabetes mellitus without complications: Secondary | ICD-10-CM

## 2021-09-26 DIAGNOSIS — I160 Hypertensive urgency: Secondary | ICD-10-CM | POA: Diagnosis not present

## 2021-09-26 DIAGNOSIS — E440 Moderate protein-calorie malnutrition: Secondary | ICD-10-CM | POA: Diagnosis present

## 2021-09-26 DIAGNOSIS — Z6821 Body mass index (BMI) 21.0-21.9, adult: Secondary | ICD-10-CM

## 2021-09-26 DIAGNOSIS — F0390 Unspecified dementia without behavioral disturbance: Secondary | ICD-10-CM | POA: Diagnosis present

## 2021-09-26 DIAGNOSIS — W19XXXA Unspecified fall, initial encounter: Secondary | ICD-10-CM

## 2021-09-26 DIAGNOSIS — Y9301 Activity, walking, marching and hiking: Secondary | ICD-10-CM | POA: Diagnosis present

## 2021-09-26 DIAGNOSIS — E854 Organ-limited amyloidosis: Secondary | ICD-10-CM | POA: Diagnosis present

## 2021-09-26 DIAGNOSIS — S72321A Displaced transverse fracture of shaft of right femur, initial encounter for closed fracture: Secondary | ICD-10-CM

## 2021-09-26 DIAGNOSIS — N1832 Chronic kidney disease, stage 3b: Secondary | ICD-10-CM | POA: Diagnosis present

## 2021-09-26 HISTORY — PX: INTRAMEDULLARY (IM) NAIL INTERTROCHANTERIC: SHX5875

## 2021-09-26 LAB — CBC WITH DIFFERENTIAL/PLATELET
Abs Immature Granulocytes: 0.22 10*3/uL — ABNORMAL HIGH (ref 0.00–0.07)
Basophils Absolute: 0 10*3/uL (ref 0.0–0.1)
Basophils Relative: 0 %
Eosinophils Absolute: 0 10*3/uL (ref 0.0–0.5)
Eosinophils Relative: 0 %
HCT: 31 % — ABNORMAL LOW (ref 36.0–46.0)
Hemoglobin: 10.8 g/dL — ABNORMAL LOW (ref 12.0–15.0)
Immature Granulocytes: 1 %
Lymphocytes Relative: 5 %
Lymphs Abs: 0.7 10*3/uL (ref 0.7–4.0)
MCH: 27.5 pg (ref 26.0–34.0)
MCHC: 34.8 g/dL (ref 30.0–36.0)
MCV: 78.9 fL — ABNORMAL LOW (ref 80.0–100.0)
Monocytes Absolute: 0.6 10*3/uL (ref 0.1–1.0)
Monocytes Relative: 4 %
Neutro Abs: 13.6 10*3/uL — ABNORMAL HIGH (ref 1.7–7.7)
Neutrophils Relative %: 90 %
Platelets: 364 10*3/uL (ref 150–400)
RBC: 3.93 MIL/uL (ref 3.87–5.11)
RDW: 23.7 % — ABNORMAL HIGH (ref 11.5–15.5)
WBC: 15.2 10*3/uL — ABNORMAL HIGH (ref 4.0–10.5)
nRBC: 0 % (ref 0.0–0.2)

## 2021-09-26 LAB — BASIC METABOLIC PANEL
Anion gap: 7 (ref 5–15)
BUN: 23 mg/dL (ref 8–23)
CO2: 23 mmol/L (ref 22–32)
Calcium: 8.9 mg/dL (ref 8.9–10.3)
Chloride: 108 mmol/L (ref 98–111)
Creatinine, Ser: 0.96 mg/dL (ref 0.44–1.00)
GFR, Estimated: >60 mL/min (ref >60)
Glucose, Bld: 178 mg/dL — ABNORMAL HIGH (ref 70–99)
Potassium: 4.1 mmol/L (ref 3.5–5.1)
Sodium: 138 mmol/L (ref 135–145)

## 2021-09-26 LAB — GLUCOSE, CAPILLARY: Glucose-Capillary: 200 mg/dL — ABNORMAL HIGH (ref 70–99)

## 2021-09-26 LAB — APTT: aPTT: 32 seconds (ref 24–36)

## 2021-09-26 LAB — PROTIME-INR
INR: 1.2 (ref 0.8–1.2)
Prothrombin Time: 14.9 seconds (ref 11.4–15.2)

## 2021-09-26 LAB — CBG MONITORING, ED
Glucose-Capillary: 194 mg/dL — ABNORMAL HIGH (ref 70–99)
Glucose-Capillary: 179 mg/dL — ABNORMAL HIGH (ref 70–99)

## 2021-09-26 IMAGING — CR DG HIP (WITH OR WITHOUT PELVIS) 2-3V*R*
1 series · 4 of 4 positions shown · non-contrast
Comparison: None Available.

CLINICAL DATA: Fall

EXAM:
DG HIP (WITH OR WITHOUT PELVIS) 2-3V RIGHT

[Series 1: dg hip unilat w or w/o pelvis 2-3 views  · non-contrast · 0.14mm/px · 4 of 4 slices shown]
[im 1/4]
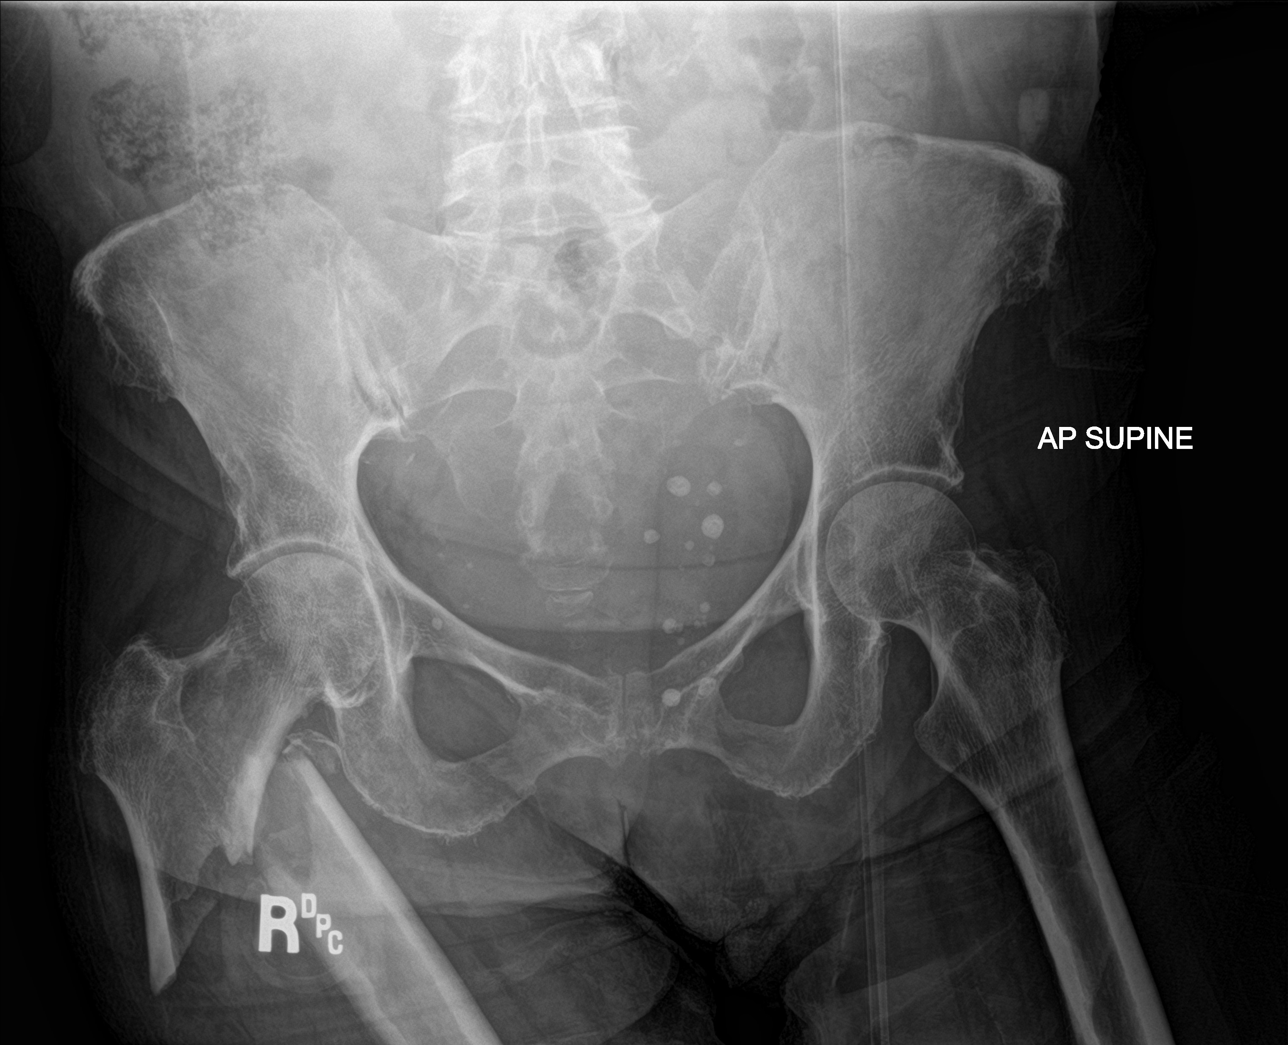
[im 2/4]
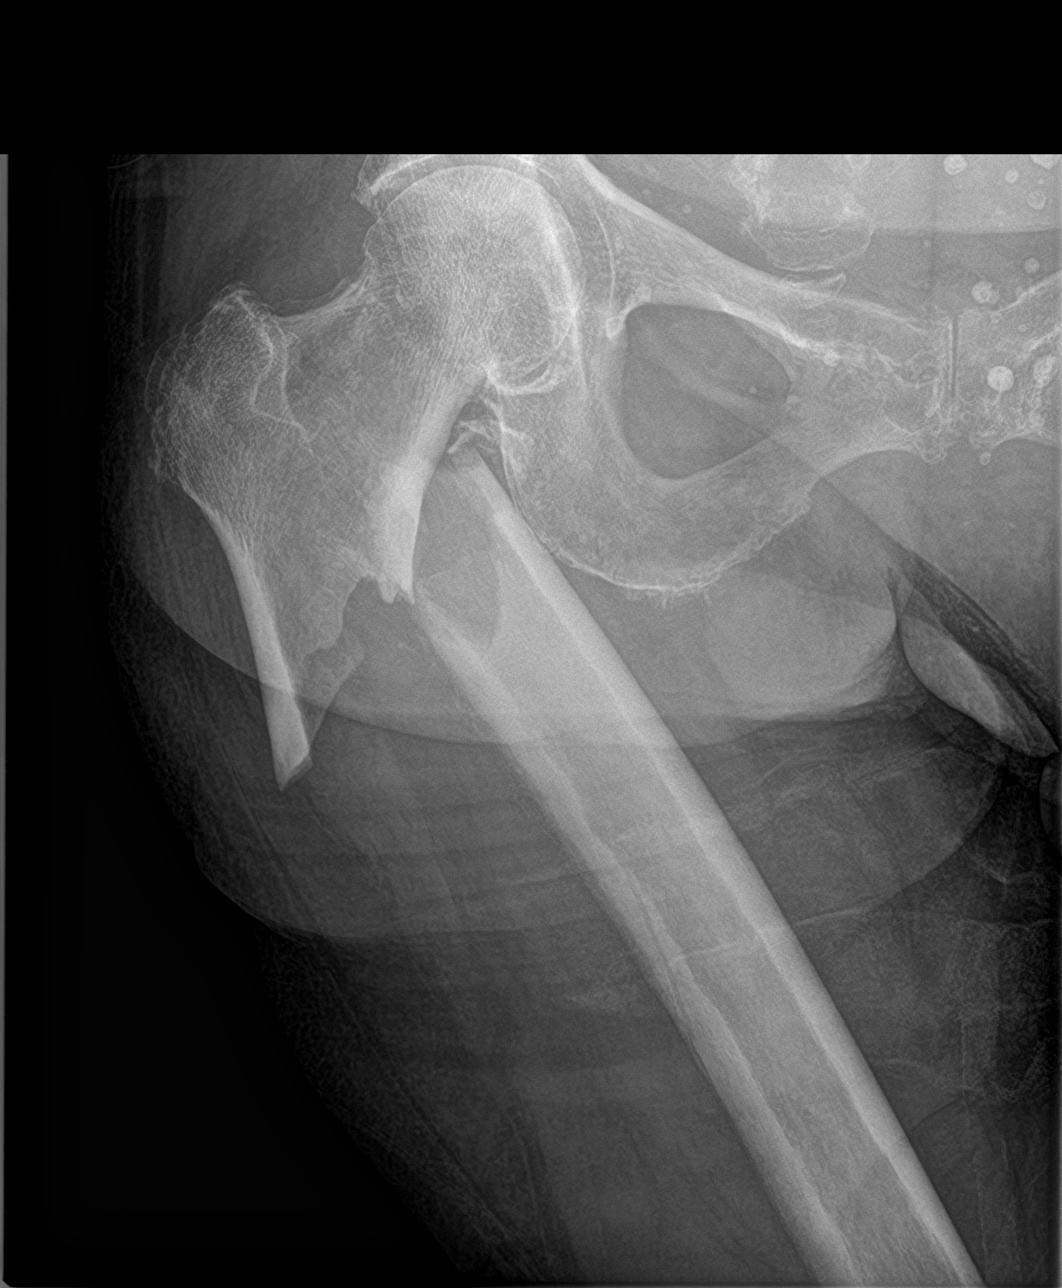
[im 3/4]
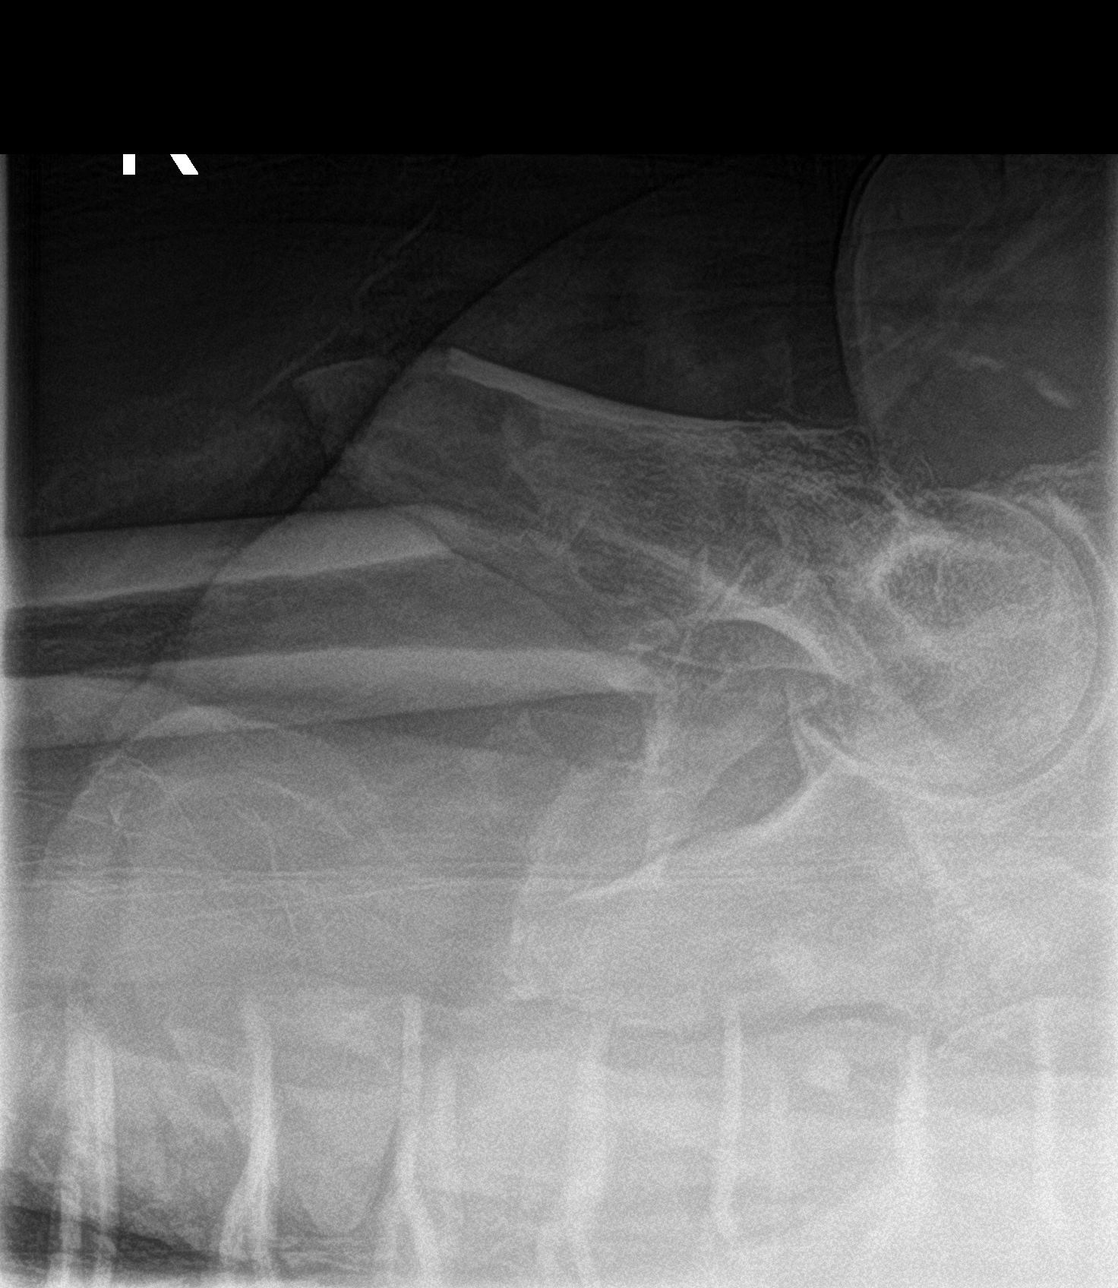
[im 4/4]
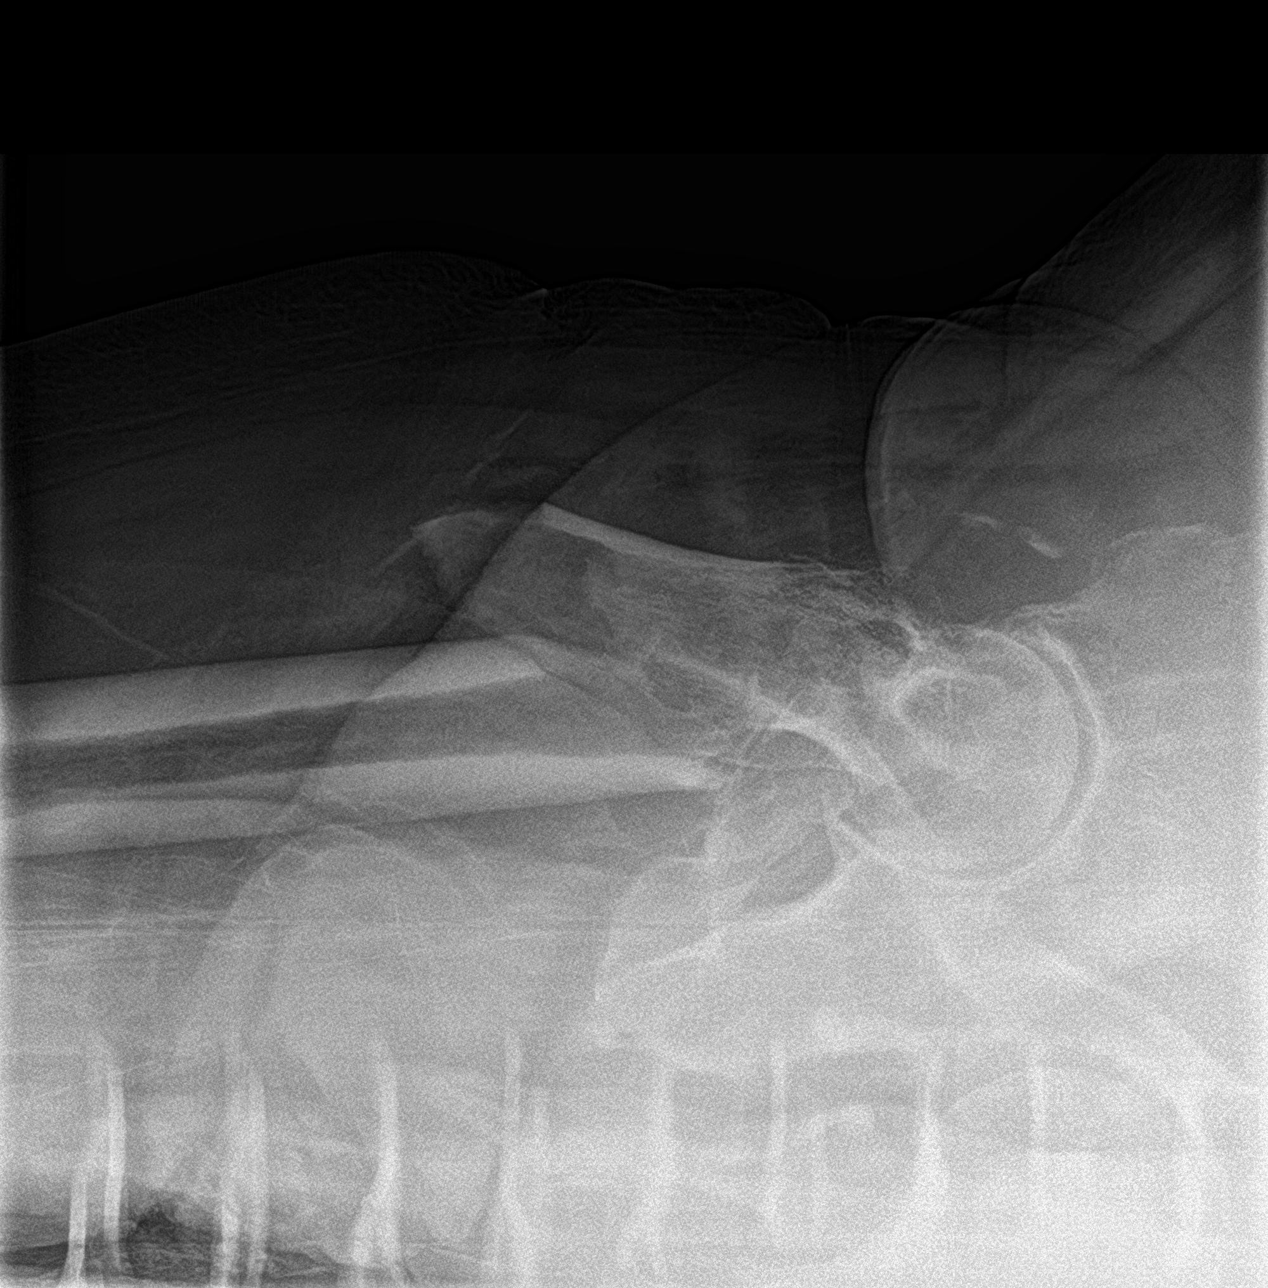

[4 of 4 positions shown; findings below may reference images not displayed]

FINDINGS: There is a fracture through the proximal shaft of the right femur.
Femoral shaft is displaced medially relative to the femoral neck. No
subluxation or dislocation at the hip.
IMPRESSION: Displaced right proximal femoral shaft fracture.

## 2021-09-26 IMAGING — CT CT CERVICAL SPINE W/O CM
3 of 4 series · 10 of 33 positions shown, 11 images · non-contrast
Comparison: None Available.

CLINICAL DATA: Facial trauma, blunt.  Unwitnessed fall.



[Series 5: sag bone · sagittal · 0.20mm/px · 5 of 43 slices shown]
[im 15/43  bone]
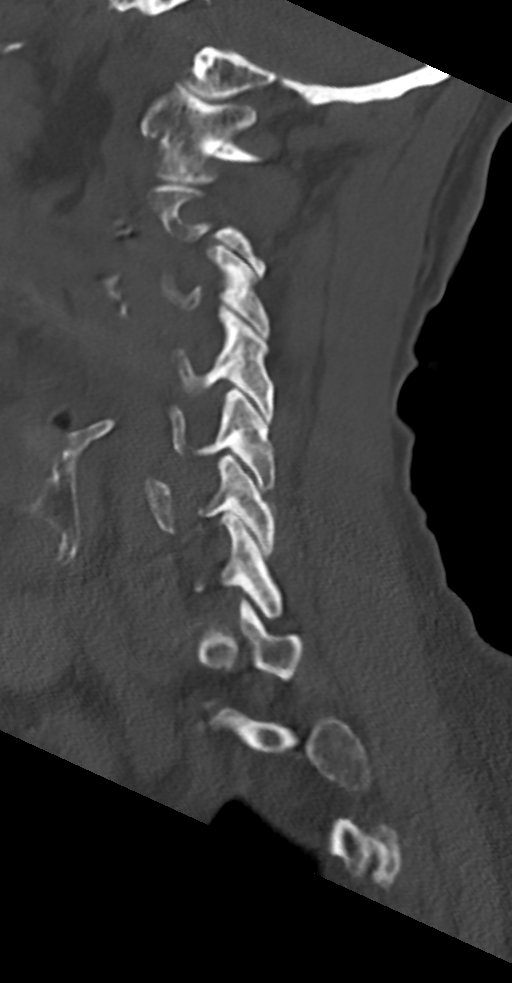
[im 18/43  bone]
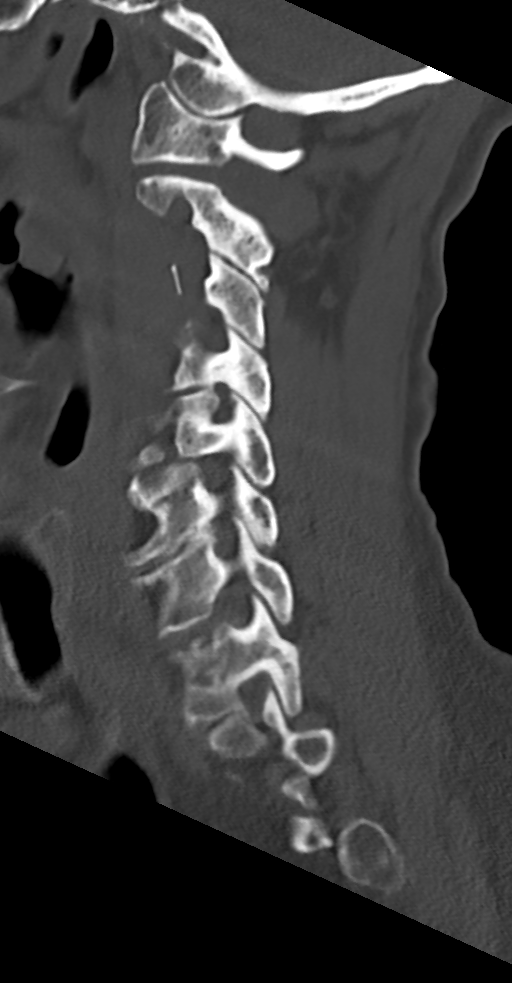
[im 22/43  bone]
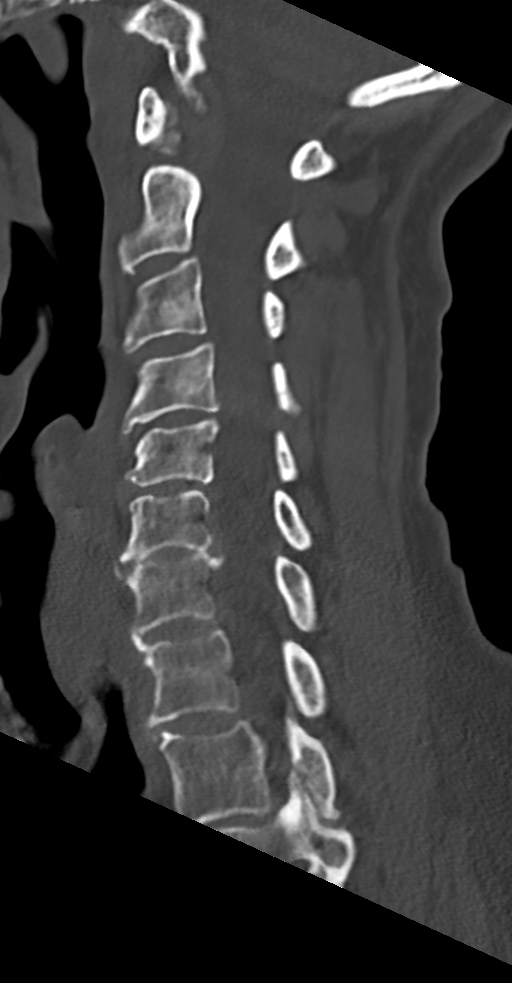
[im 25/43  bone]
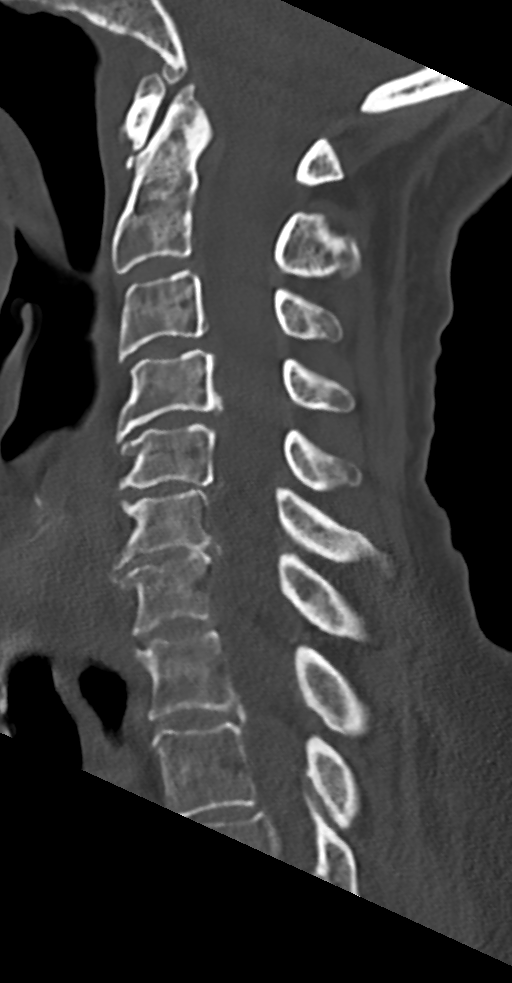
[im 29/43  bone]
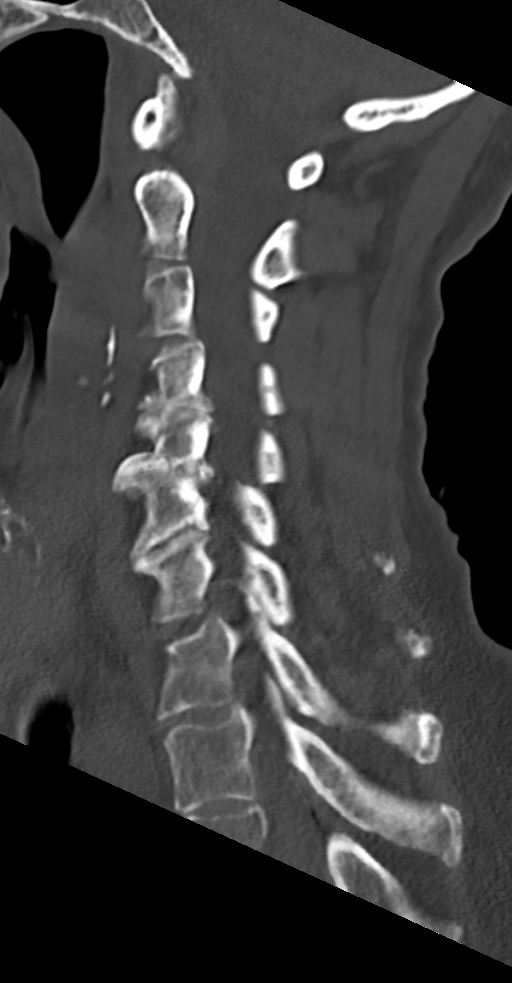

[Series 6: cor bone · coronal · 0.16mm/px · 3 of 51 slices shown]
[im 11/51  bone]
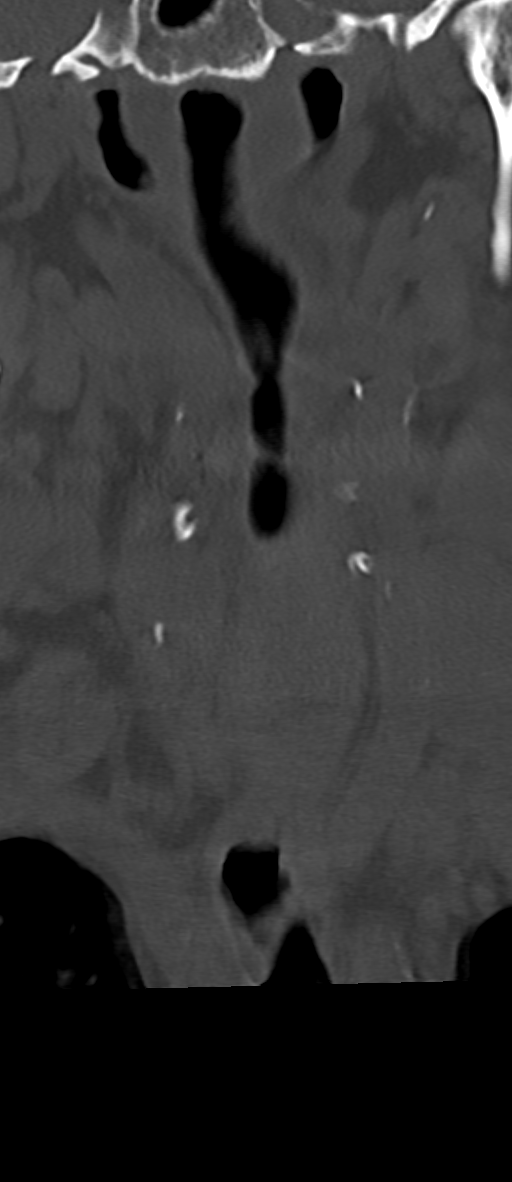
[im 21/51  bone]
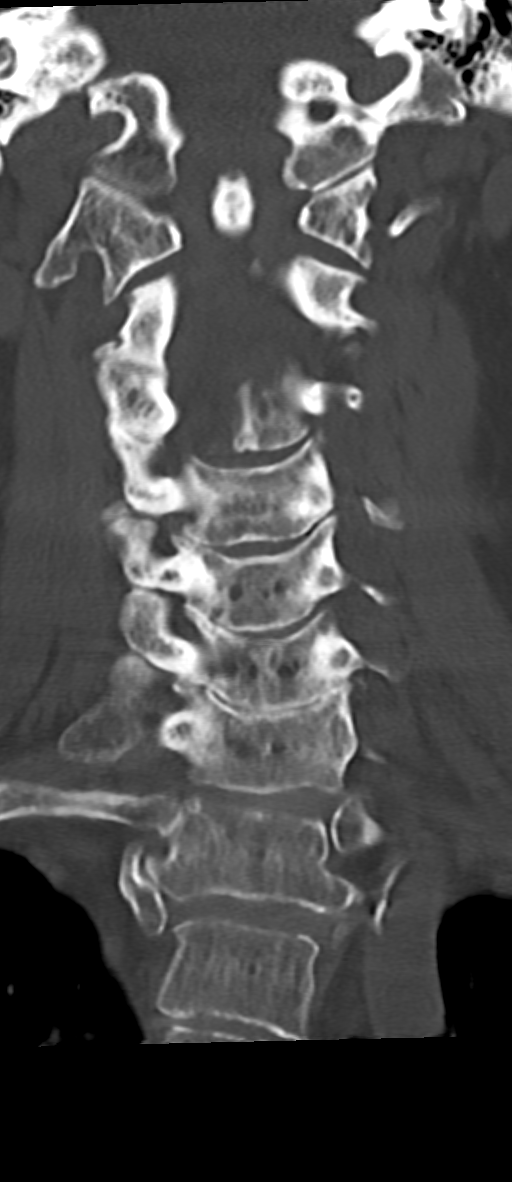
[im 31/51  bone]
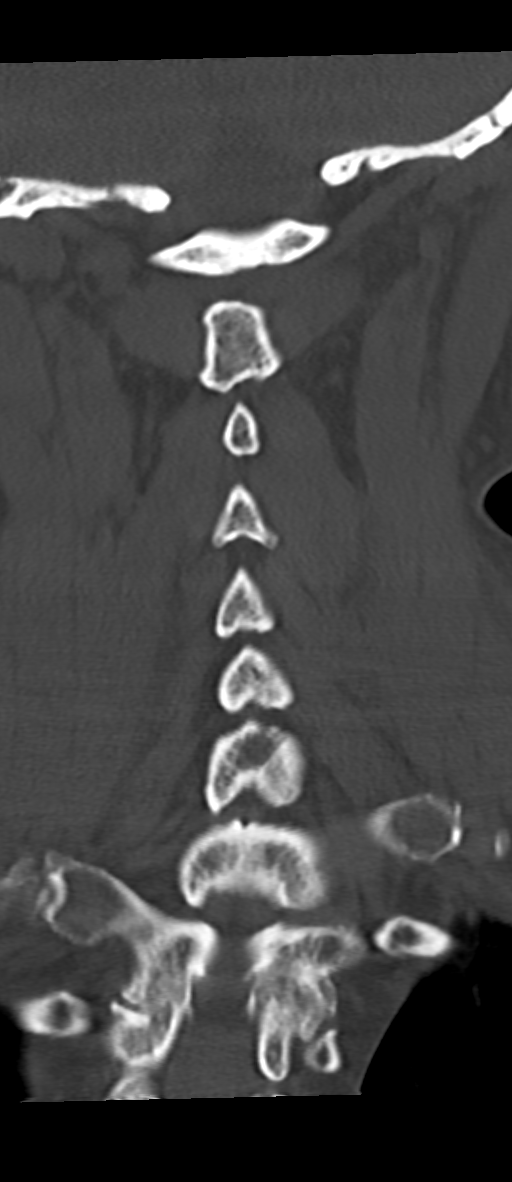

[Series 7: orthogonal axials · axial · 0.16mm/px · z∈[-224,-150]mm · 2 of 97 slices shown, 3 images]
[im 28/97  soft-tissue]
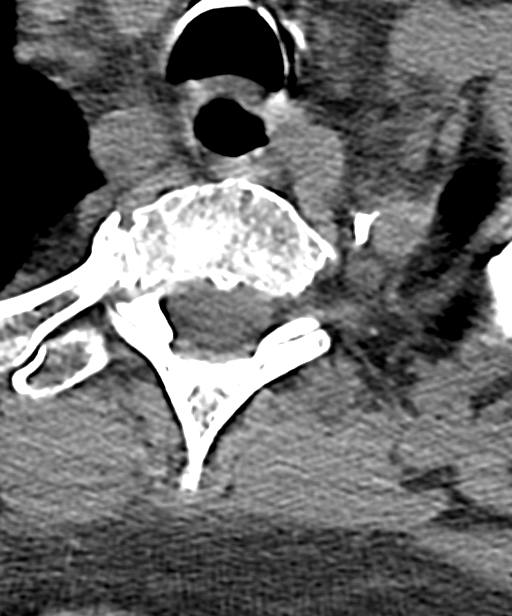
[im 28/97  bone]
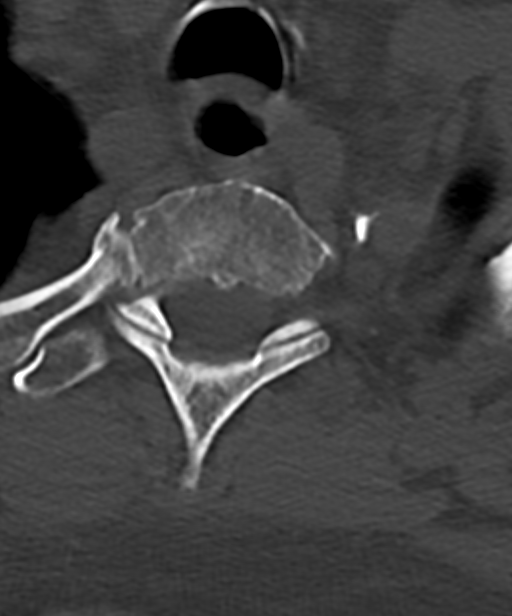
[im 69/97  bone]
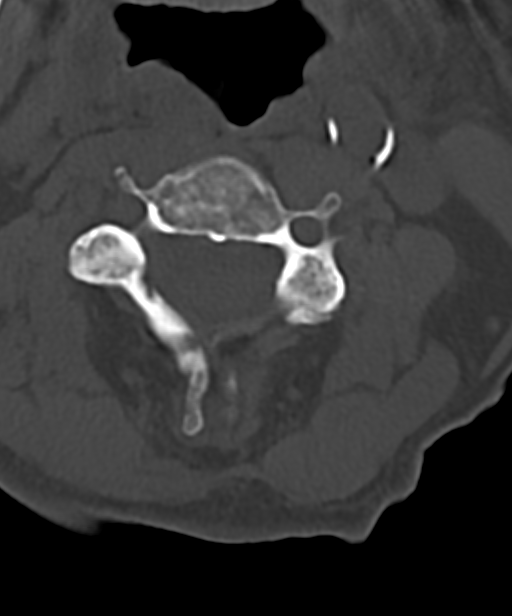

[10 of 33 positions shown; findings below may reference images not displayed]

FINDINGS: Alignment: No subluxation

Skull base and vertebrae: No acute fracture. No primary bone lesion
or focal pathologic process.

Soft tissues and spinal canal: No prevertebral fluid or swelling. No
visible canal hematoma.

Disc levels:  Diffuse degenerative disc disease and facet disease.

Upper chest: No acute findings

Other: None
IMPRESSION: No acute bony abnormality.

## 2021-09-26 IMAGING — DX DG PORTABLE PELVIS
1 series · 1 of 1 positions shown · non-contrast
Comparison: [DATE] intraoperative images and hip radiographs

CLINICAL DATA: Status post right IM nail fixation

EXAM:
RIGHT FEMUR 2 VIEWS; PORTABLE PELVIS 1-2 VIEWS

[pelvis ap]
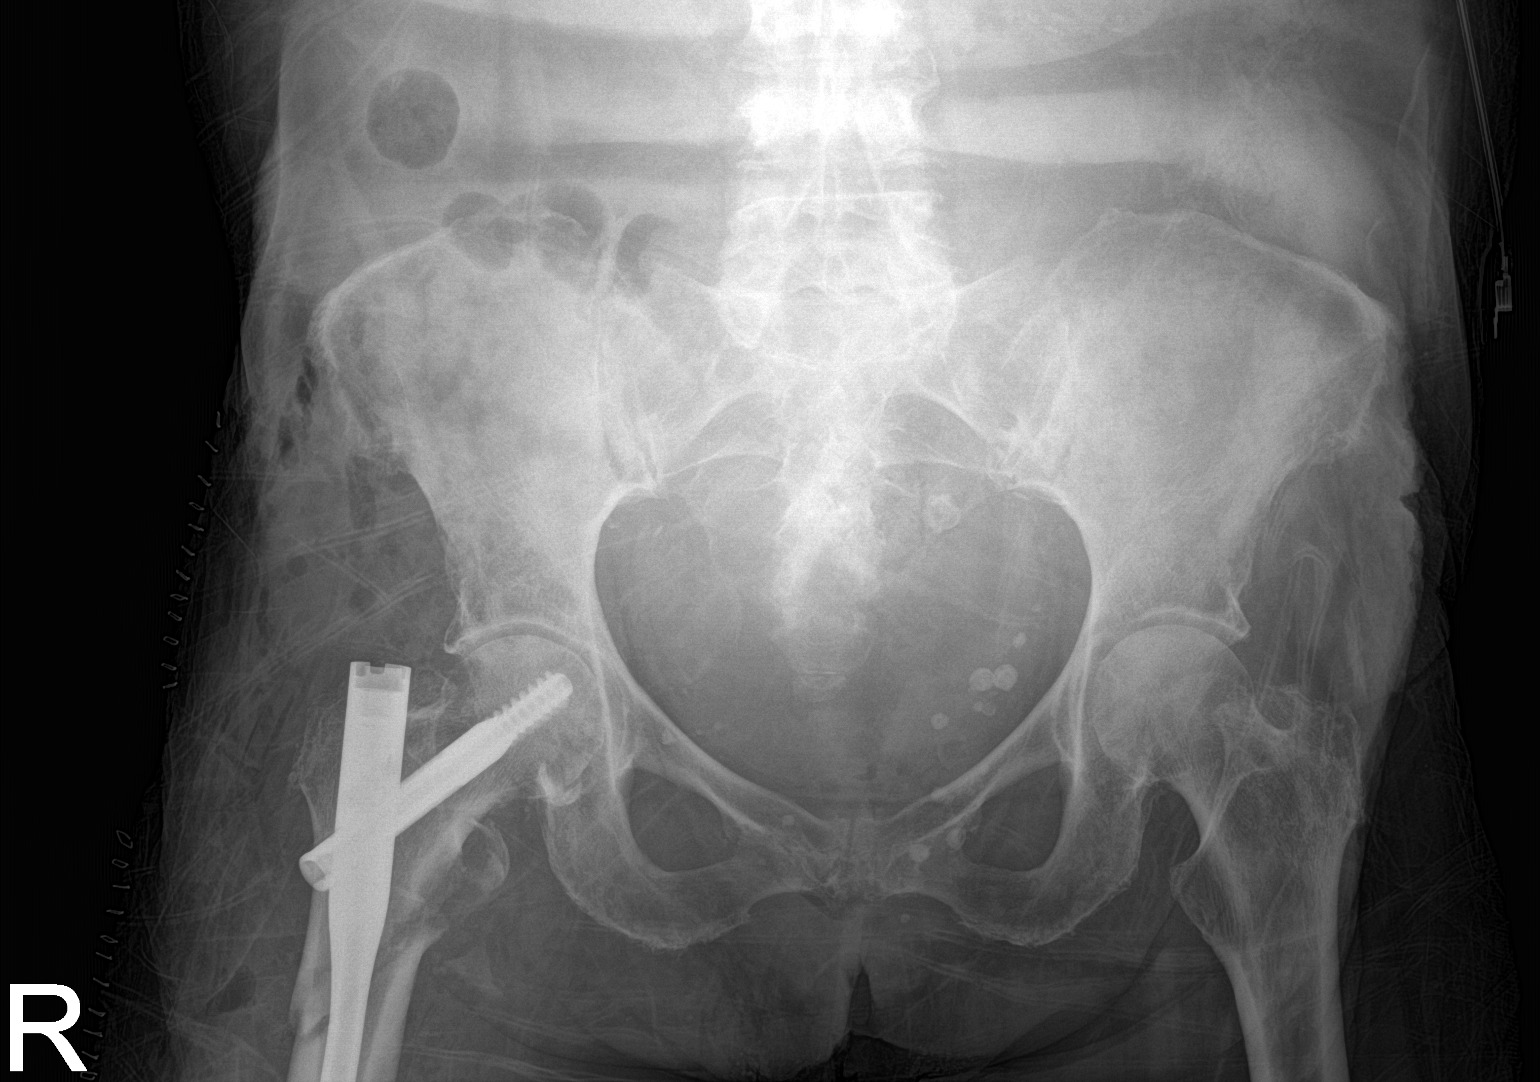

[1 of 1 positions shown; findings below may reference images not displayed]

FINDINGS: Status post intramedullary nail fixation of a previously noted
proximal right femoral diaphyseal fracture with improved alignment
of the fracture components. No perihardware lucency or additional
acute fracture in the pelvis or right femur. Air within the soft
tissues, not unexpected post surgically. Superficial skin staples.
IMPRESSION: Expected postoperative appearance, status post intramedullary nail
fixation of a right femoral fracture.

## 2021-09-26 IMAGING — DX DG FEMUR 2+V*R*
4 series · 4 of 4 positions shown · non-contrast
Comparison: [DATE] intraoperative images and hip radiographs

CLINICAL DATA: Status post right IM nail fixation

EXAM:
RIGHT FEMUR 2 VIEWS; PORTABLE PELVIS 1-2 VIEWS

[femur ap (1 of 2)]
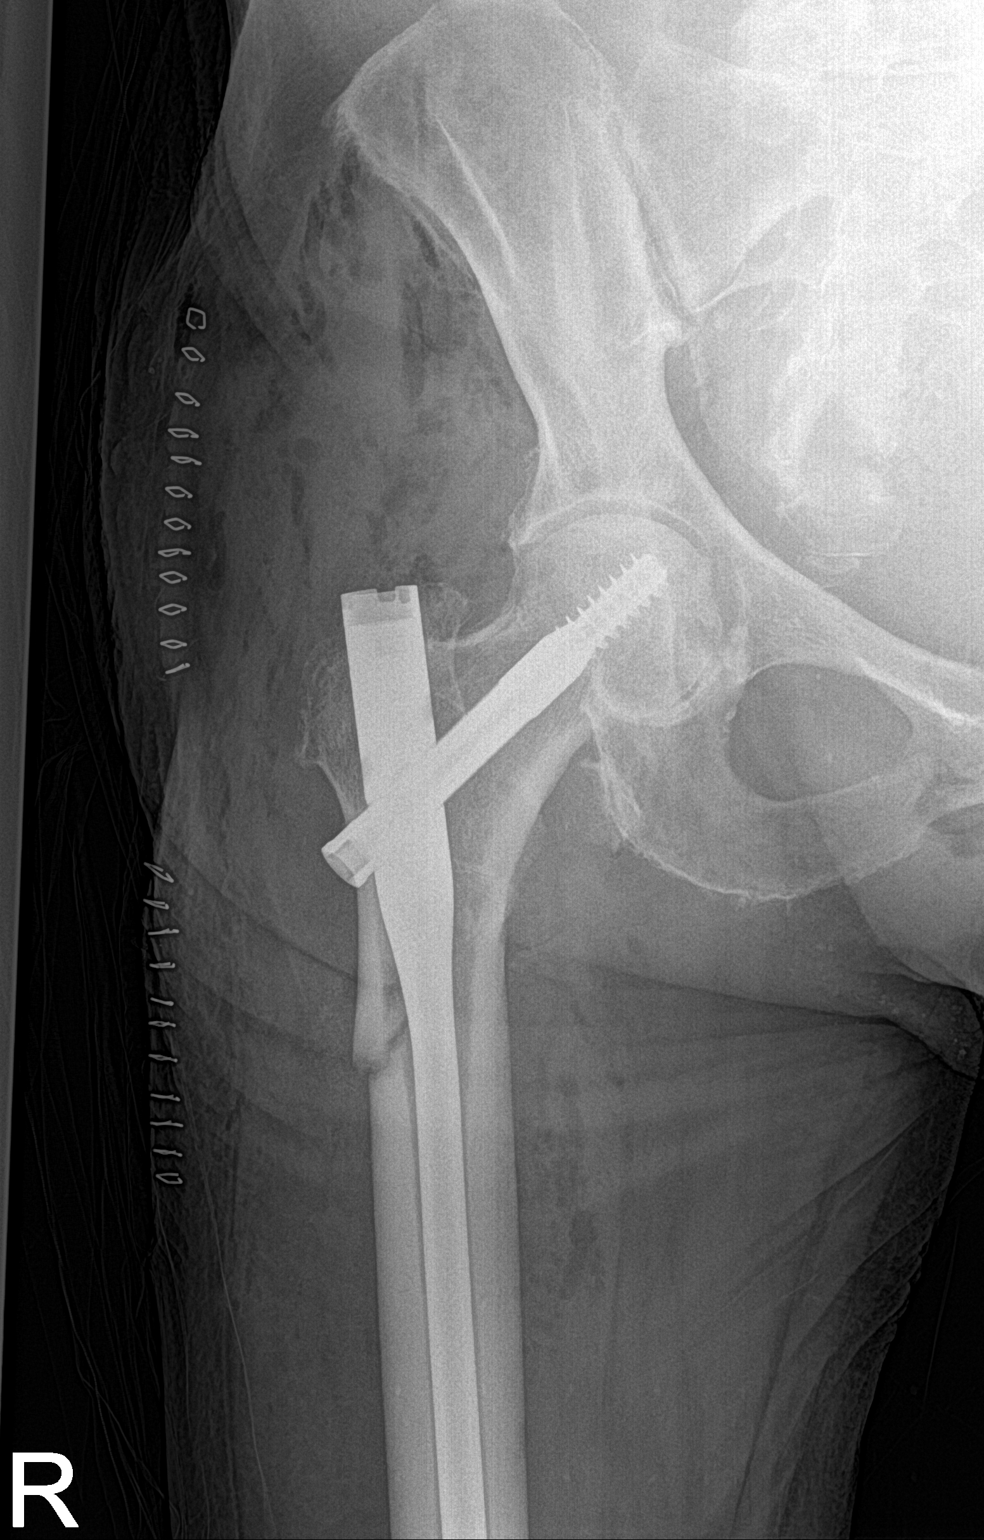

[femur ap (2 of 2)]
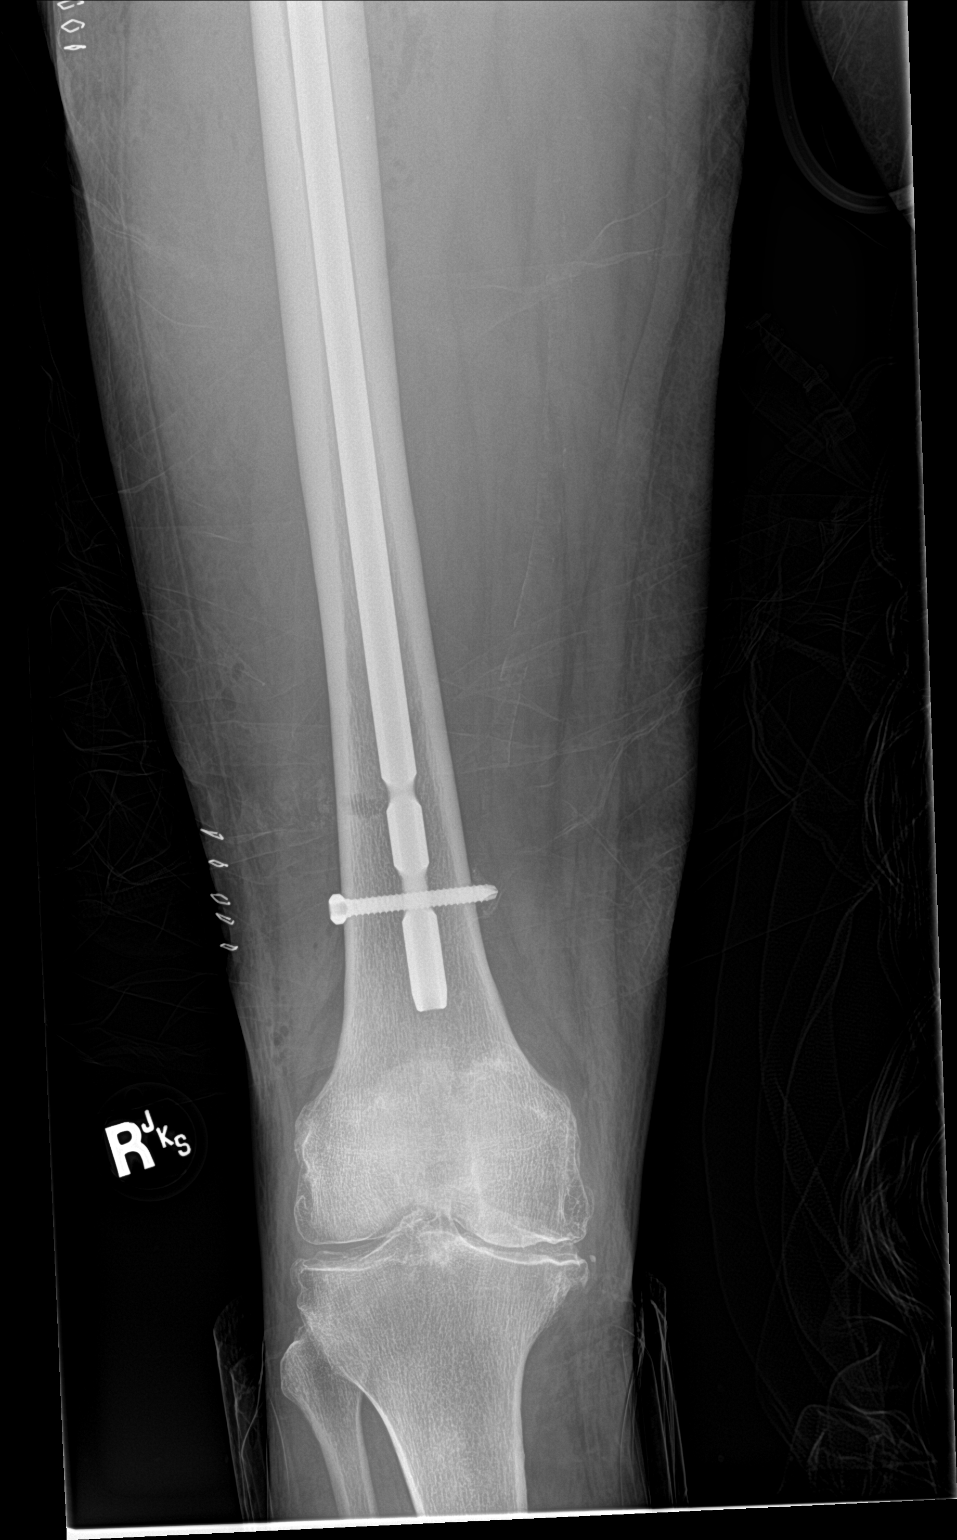

[femur lat (1 of 2)]
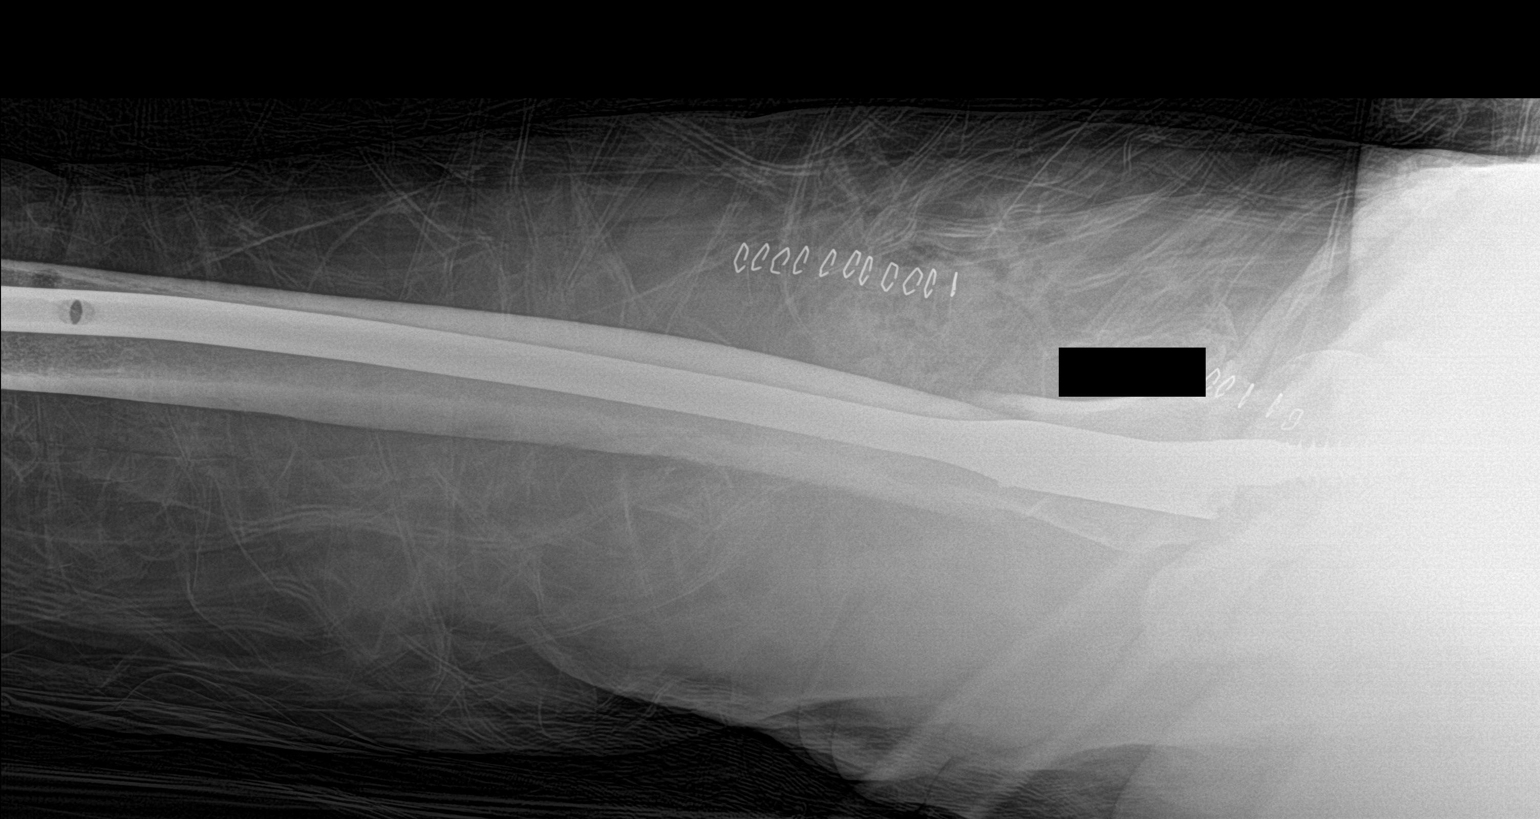

[femur lat (2 of 2)]
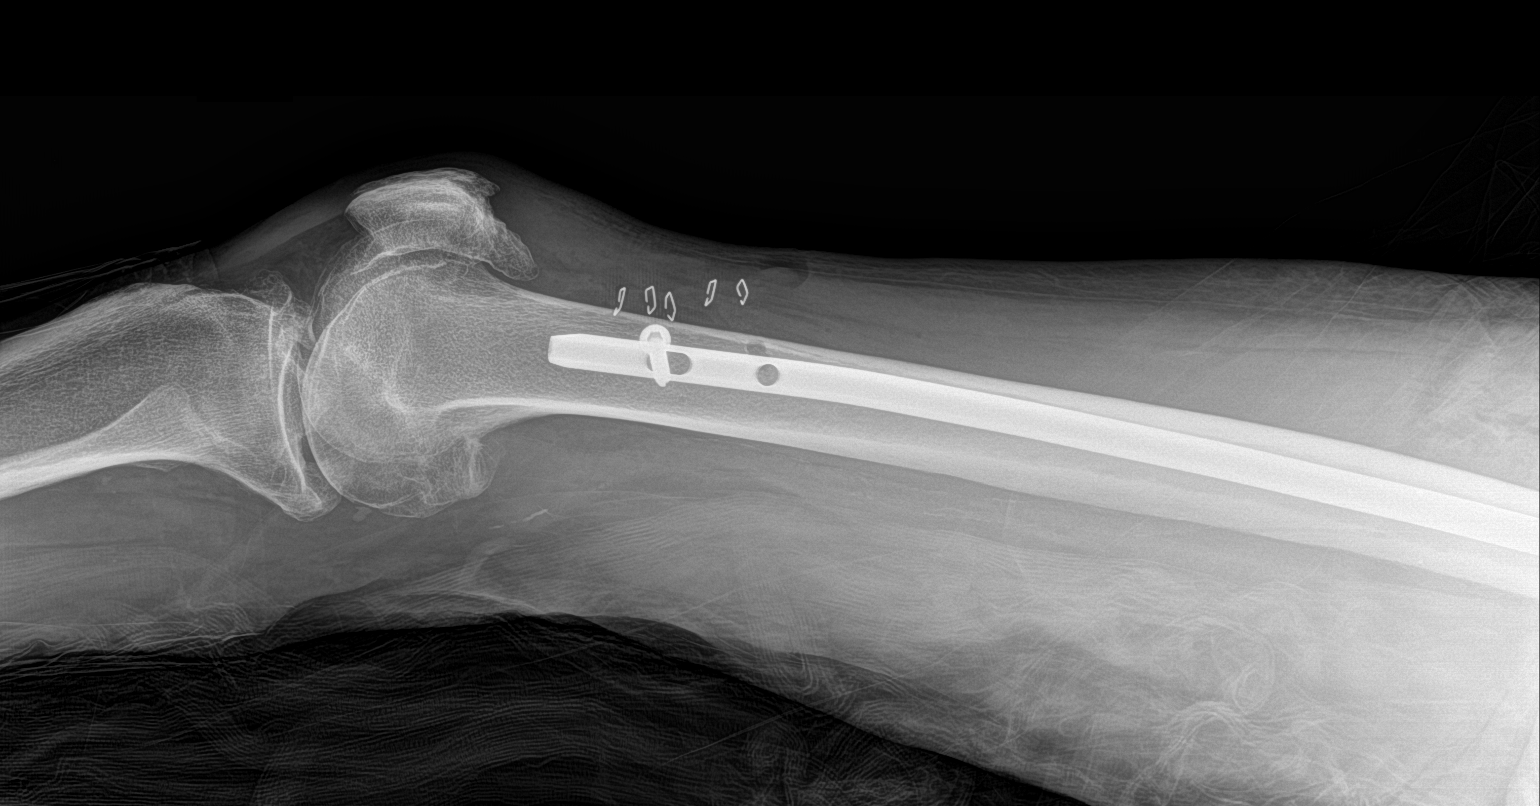

[4 of 4 positions shown; findings below may reference images not displayed]

FINDINGS: Status post intramedullary nail fixation of a previously noted
proximal right femoral diaphyseal fracture with improved alignment
of the fracture components. No perihardware lucency or additional
acute fracture in the pelvis or right femur. Air within the soft
tissues, not unexpected post surgically. Superficial skin staples.
IMPRESSION: Expected postoperative appearance, status post intramedullary nail
fixation of a right femoral fracture.

## 2021-09-26 IMAGING — XA DG FEMUR 2+V*R*
6 series · 6 of 6 positions shown · non-contrast
Comparison: Pelvis and right hip radiographs [DATE]

CLINICAL DATA: Right proximal femoral ORIF

EXAM:
RIGHT FEMUR 2 VIEWS

[Series 1: cont. · 1 of 1 slices shown (1 of 6)]
[im 1/1]
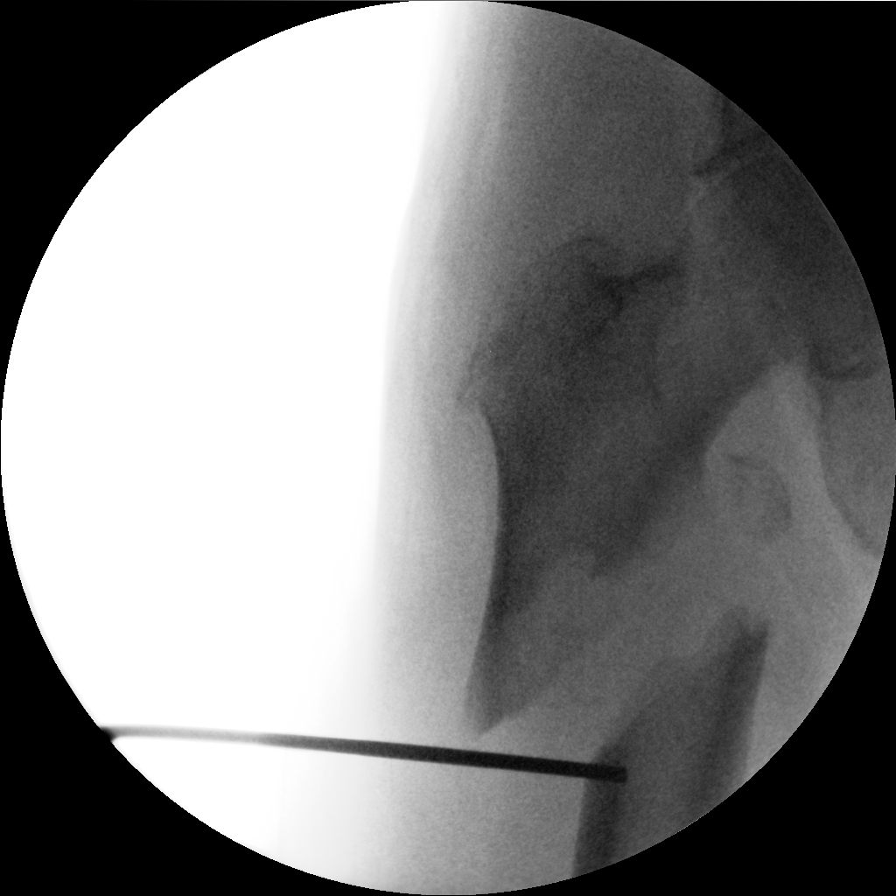

[Series 2: cont. · 1 of 1 slices shown (2 of 6)]
[im 1/1]
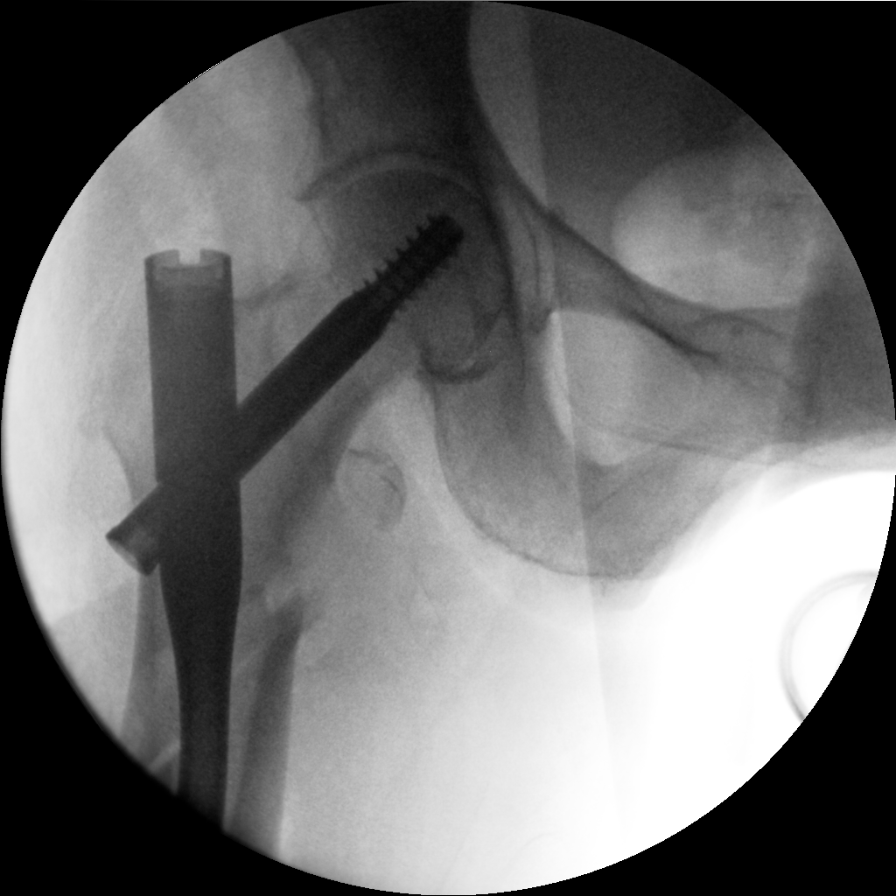

[Series 3: cont. · 1 of 1 slices shown (3 of 6)]
[im 1/1]
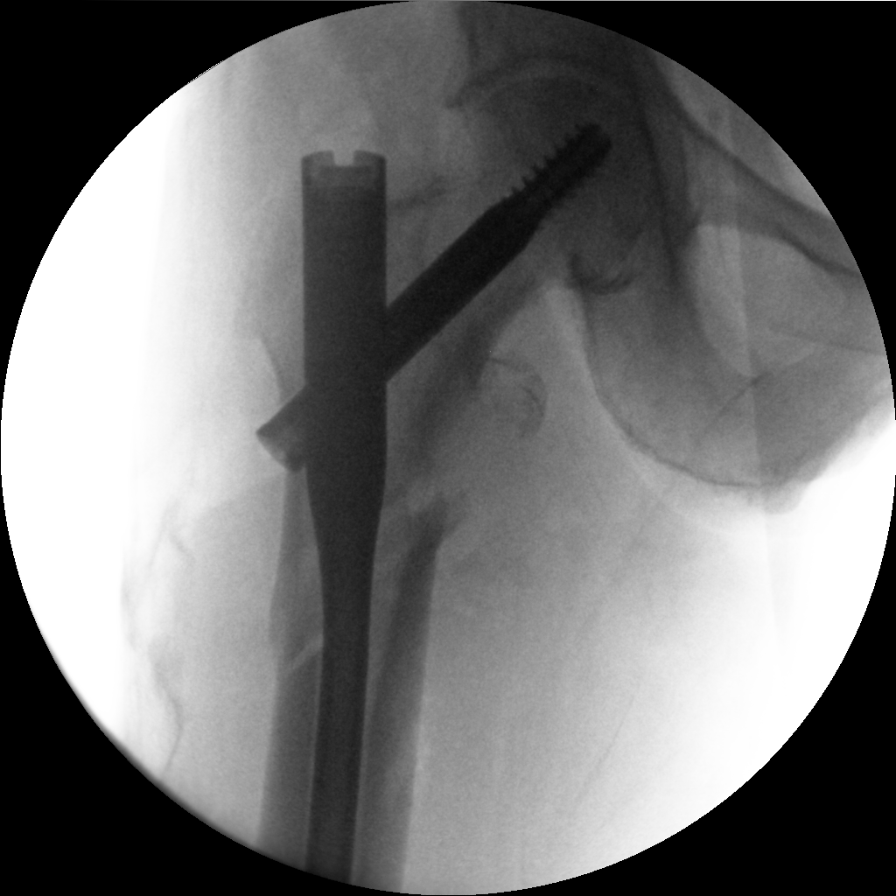

[Series 4: cont. · 1 of 1 slices shown (4 of 6)]
[im 1/1]
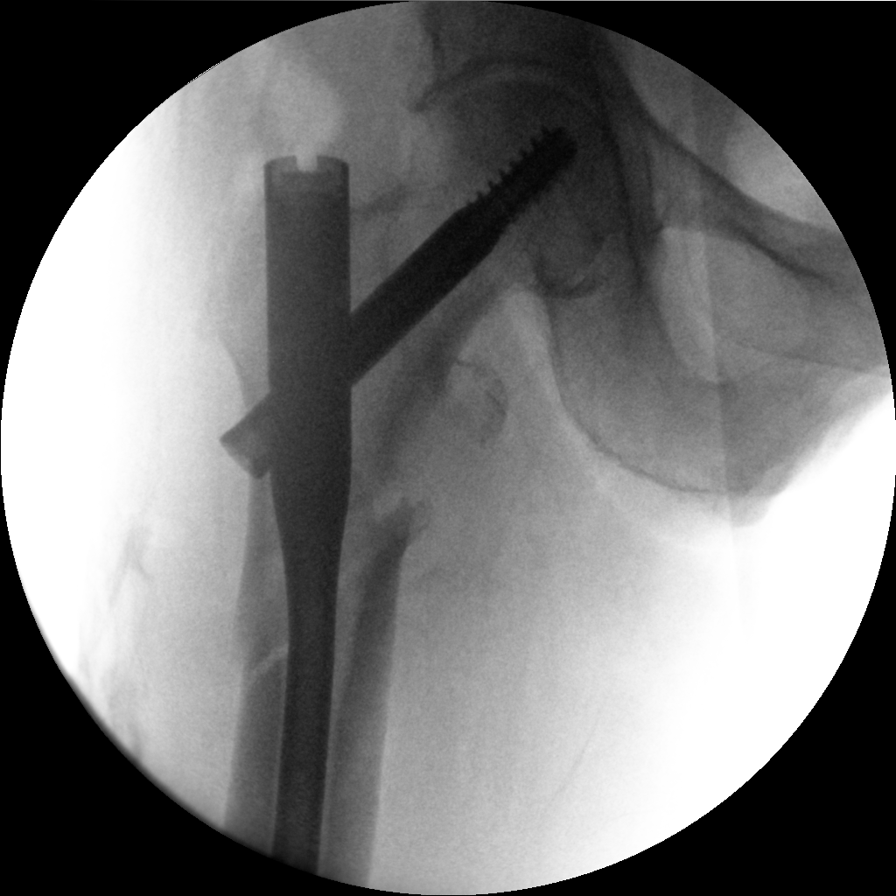

[Series 5: cont. · 1 of 1 slices shown (5 of 6)]
[im 1/1]
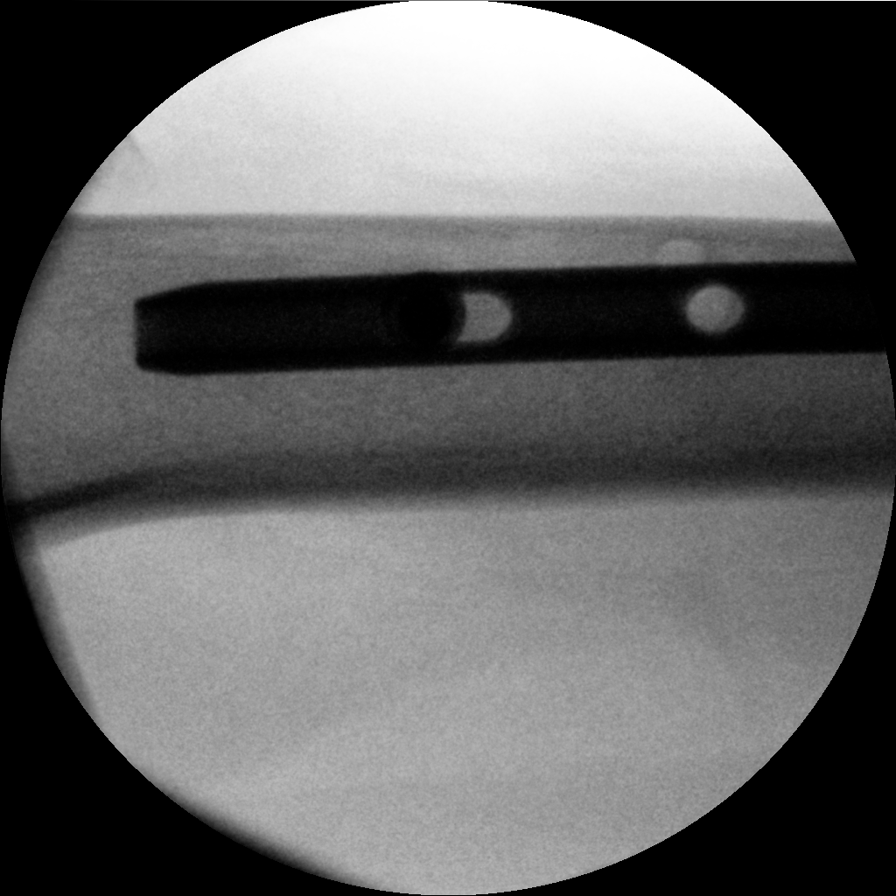

[Series 6: cont. · 1 of 1 slices shown (6 of 6)]
[im 1/1]
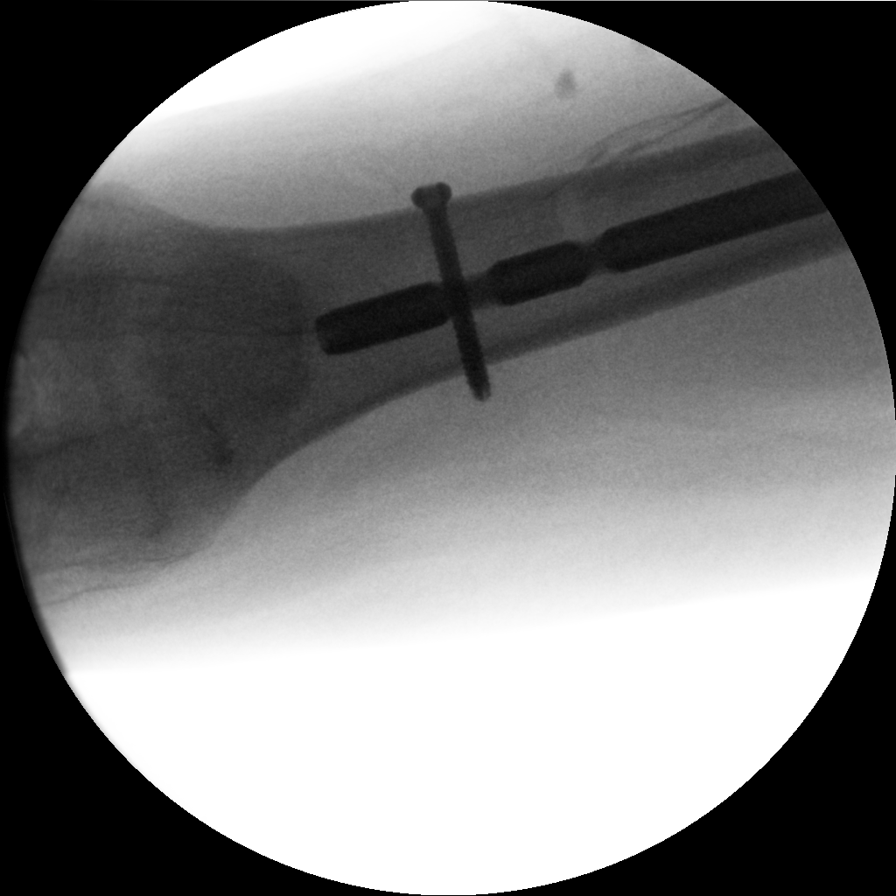

[6 of 6 positions shown; findings below may reference images not displayed]

FINDINGS: Images were performed intraoperatively without the presence of a
radiologist. Redemonstration of oblique proximal right femoral
diaphyseal fracture. New long-stem cephalo medullary nail fixation
of this fracture with improved now anatomic alignment. No
complication visualized.

Total fluoroscopy images: 6

Total fluoroscopy time: 183 seconds

Please see intraoperative findings for further detail.
IMPRESSION: Intraoperative fluoroscopy for proximal right femoral ORIF.

## 2021-09-26 IMAGING — CT CT HEAD W/O CM
4 series · 17 of 47 positions shown, 19 images · non-contrast
Comparison: [DATE]

CLINICAL DATA: Head trauma, minor (Age >= 65y).  Unwitnessed fall



[Series 2: head wo · axial · 0.43mm/px · z∈[-88,+32]mm · 7 of 33 slices shown, 9 images]
[im 5/33  brain]
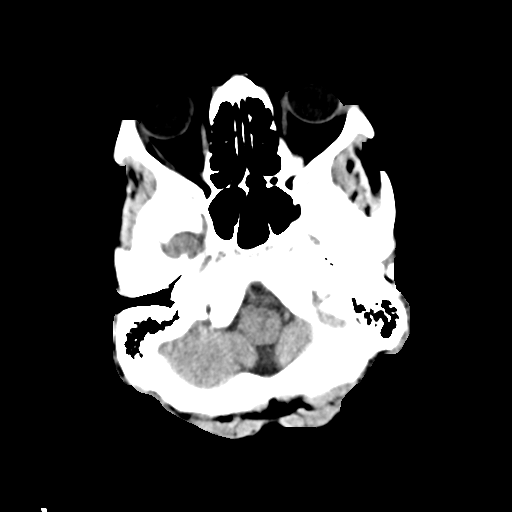
[im 5/33  bone]
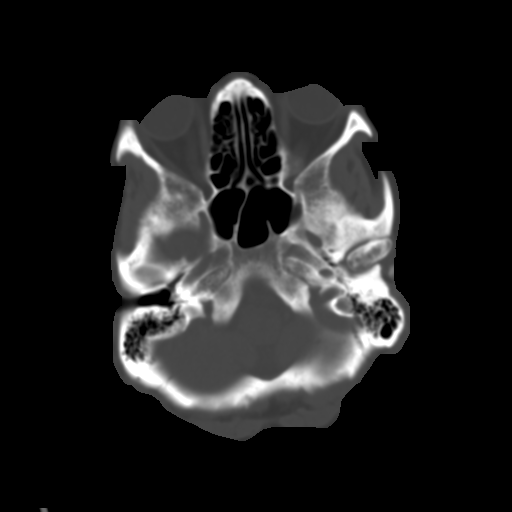
[im 9/33  brain]
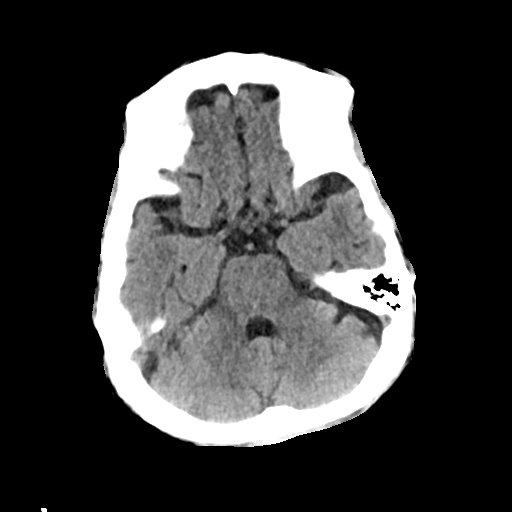
[im 13/33  brain]
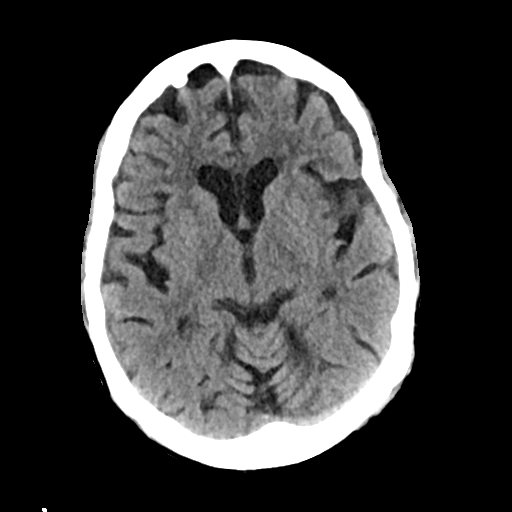
[im 17/33  brain]
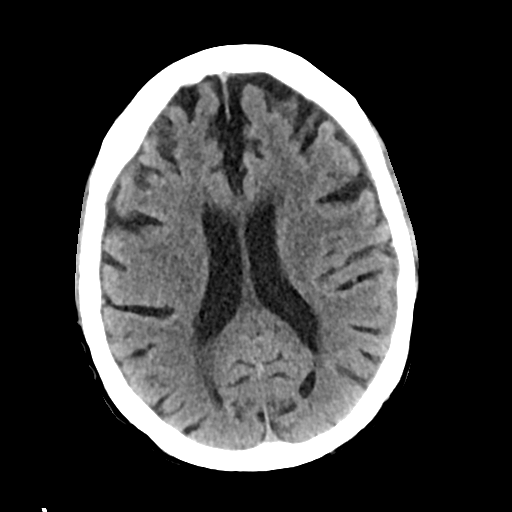
[im 21/33  brain]
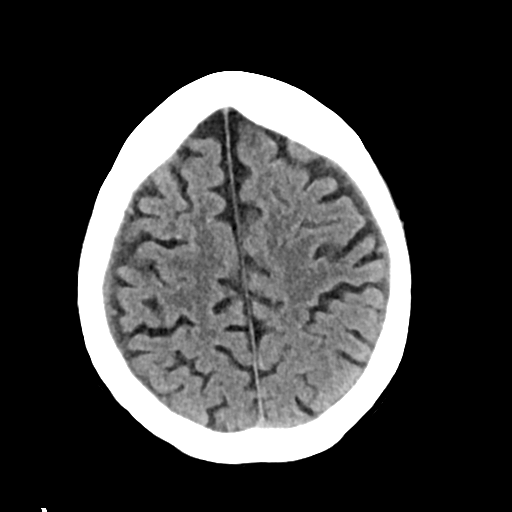
[im 21/33  bone]
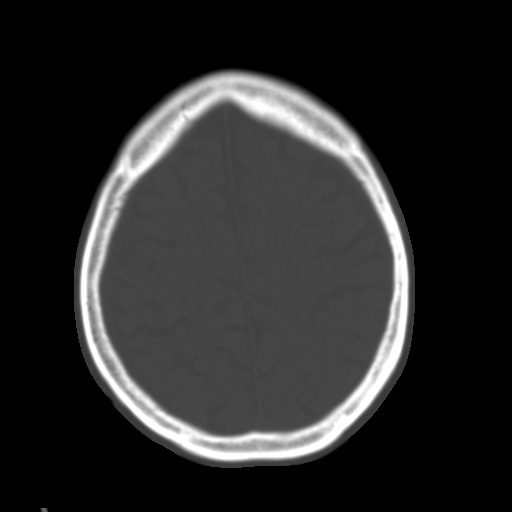
[im 25/33  brain]
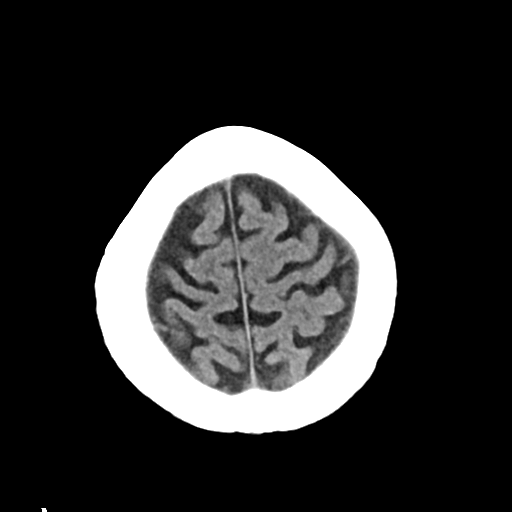
[im 29/33  brain]
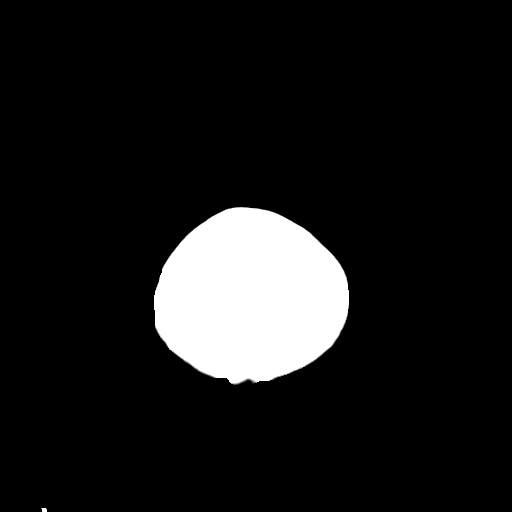

[Series 3: head bone · axial · 0.43mm/px · z∈[-92,-36]mm · 4 of 83 slices shown]
[im 9/83  bone]
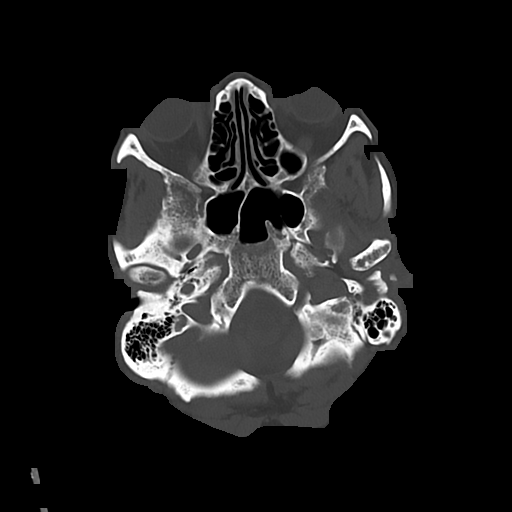
[im 17/83  bone]
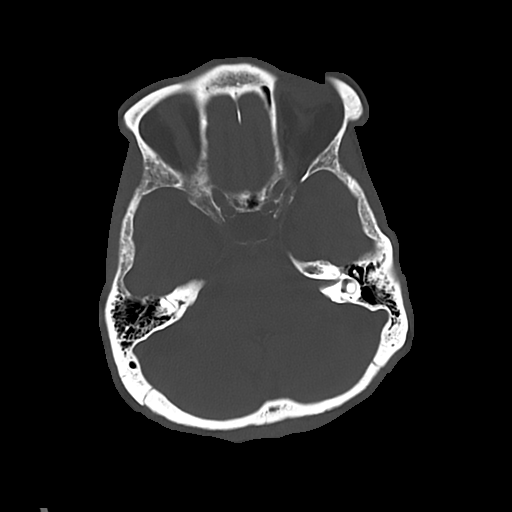
[im 25/83  bone]
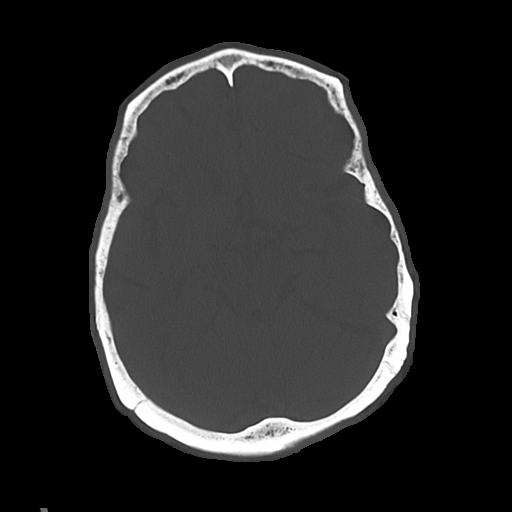
[im 37/83  bone]
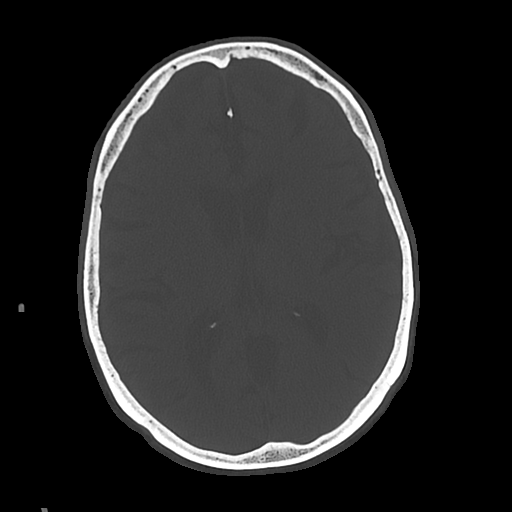

[Series 4: cor soft · coronal · 0.32mm/px · 3 of 64 slices shown]
[im 22/64  brain]
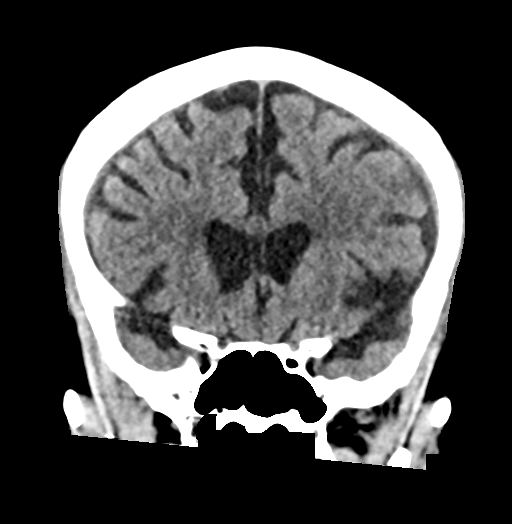
[im 29/64  brain]
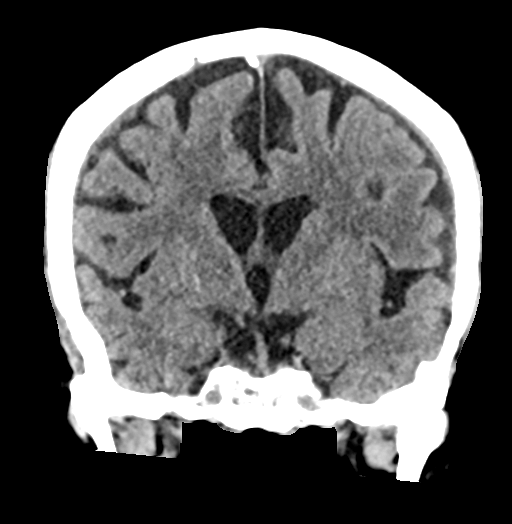
[im 36/64  brain]
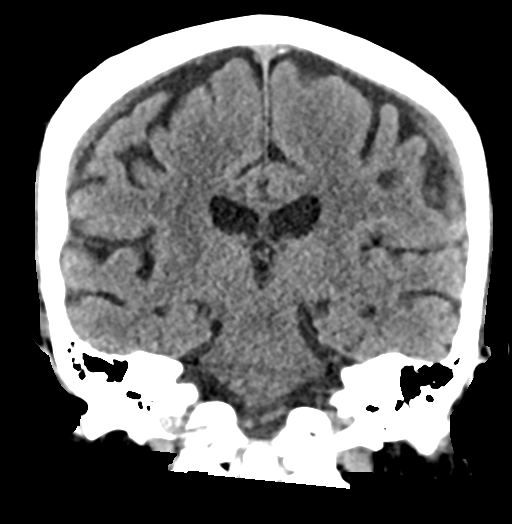

[Series 5: sag soft · sagittal · 0.33mm/px · 3 of 53 slices shown]
[im 18/53  brain]
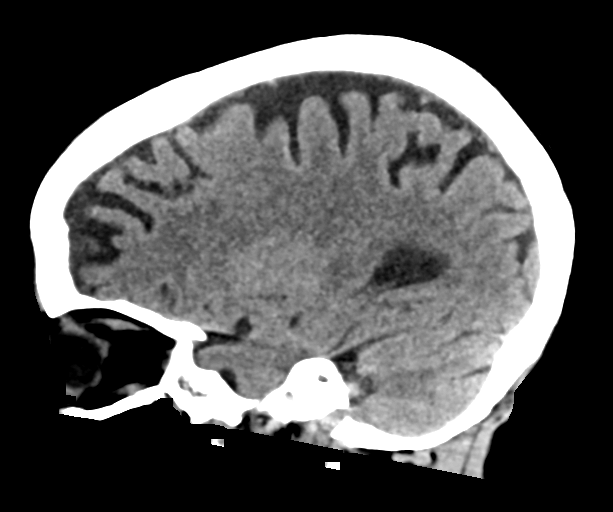
[im 27/53  brain]
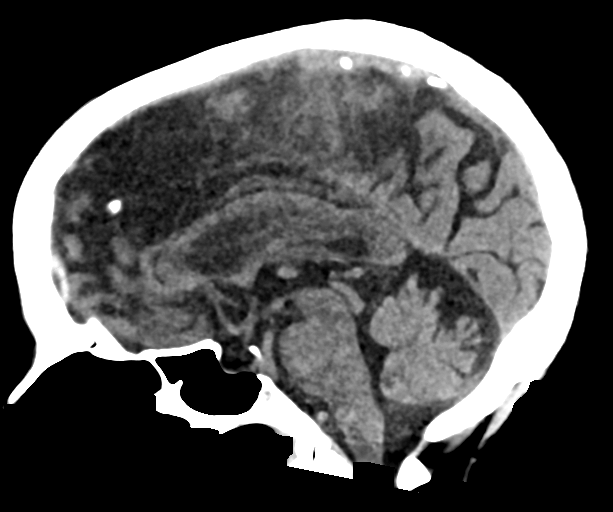
[im 35/53  brain]
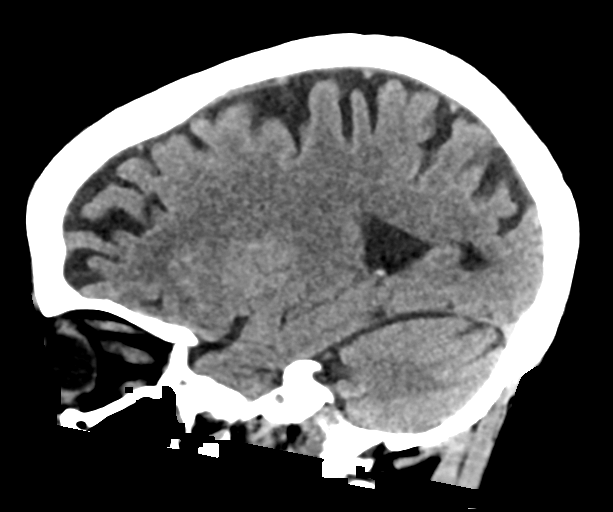

[17 of 47 positions shown; findings below may reference images not displayed]

FINDINGS: Brain: Small chronic bilateral subdural hematomas are again noted,
approximately 3 mm bilaterally, stable or slightly smaller since
prior study. No new hemorrhage, hydrocephalus or acute infarction.
No mass effect or midline shift. Mild atrophy.

Vascular: No hyperdense vessel or unexpected calcification.

Skull: No acute calvarial abnormality.

Sinuses/Orbits: No acute findings

Other: None
IMPRESSION: Small bilateral chronic subdural hematomas, stable or slightly
smaller since prior study.

No acute intracranial abnormality.

## 2021-09-26 SURGERY — FIXATION, FRACTURE, INTERTROCHANTERIC, WITH INTRAMEDULLARY ROD
Anesthesia: Spinal | Site: Hip | Laterality: Right

## 2021-09-26 MED ORDER — BUPIVACAINE HCL (PF) 0.5 % IJ SOLN
INTRAMUSCULAR | Status: DC | PRN
  Administered 2021-09-26: 12.5 mg via INTRATHECAL

## 2021-09-26 MED ORDER — MORPHINE SULFATE (PF) 2 MG/ML IV SOLN
0.5000 mg | INTRAVENOUS | Status: DC | PRN
  Administered 2021-10-02: 1 mg via INTRAVENOUS
  Filled 2021-09-26: qty 1

## 2021-09-26 MED ORDER — HYDROCODONE-ACETAMINOPHEN 5-325 MG PO TABS: 1.0000 | ORAL_TABLET | Freq: Four times a day (QID) | ORAL | Status: DC | PRN

## 2021-09-26 MED ORDER — FENTANYL CITRATE PF 50 MCG/ML IJ SOSY
PREFILLED_SYRINGE | INTRAMUSCULAR | Status: AC
  Administered 2021-09-26: 25 ug via INTRAVENOUS
  Filled 2021-09-26: qty 1

## 2021-09-26 MED ORDER — DOCUSATE SODIUM 100 MG PO CAPS
100.0000 mg | ORAL_CAPSULE | Freq: Two times a day (BID) | ORAL | Status: DC
  Administered 2021-09-26 – 2021-10-03 (×14): 100 mg via ORAL
  Filled 2021-09-26 (×14): qty 1

## 2021-09-26 MED ORDER — FENTANYL CITRATE (PF) 100 MCG/2ML IJ SOLN: 25.0000 ug | INTRAMUSCULAR | Status: DC | PRN

## 2021-09-26 MED ORDER — HYDROCODONE-ACETAMINOPHEN 5-325 MG PO TABS
1.0000 | ORAL_TABLET | ORAL | Status: DC | PRN
  Administered 2021-09-27 – 2021-09-28 (×3): 1 via ORAL
  Administered 2021-09-30: 2 via ORAL
  Administered 2021-10-01 (×2): 1 via ORAL
  Administered 2021-10-01: 2 via ORAL
  Administered 2021-10-02 – 2021-10-03 (×2): 1 via ORAL
  Filled 2021-09-26: qty 2
  Filled 2021-09-26 (×3): qty 1
  Filled 2021-09-26: qty 2
  Filled 2021-09-26 (×5): qty 1

## 2021-09-26 MED ORDER — ACETAMINOPHEN 10 MG/ML IV SOLN
INTRAVENOUS | Status: AC
  Filled 2021-09-26: qty 100

## 2021-09-26 MED ORDER — FERROUS SULFATE 325 (65 FE) MG PO TABS
325.0000 mg | ORAL_TABLET | Freq: Every day | ORAL | Status: DC
  Administered 2021-09-26 – 2021-10-03 (×8): 325 mg via ORAL
  Filled 2021-09-26 (×8): qty 1

## 2021-09-26 MED ORDER — SODIUM CHLORIDE 0.9 % IV SOLN: INTRAVENOUS | Status: DC

## 2021-09-26 MED ORDER — LOPERAMIDE HCL 2 MG PO CAPS: 2.0000 mg | ORAL_CAPSULE | ORAL | Status: DC

## 2021-09-26 MED ORDER — PHENYLEPHRINE HCL (PRESSORS) 10 MG/ML IV SOLN
INTRAVENOUS | Status: DC | PRN
  Administered 2021-09-26: 80 ug via INTRAVENOUS
  Administered 2021-09-26: 160 ug via INTRAVENOUS
  Administered 2021-09-26: 80 ug via INTRAVENOUS
  Administered 2021-09-26 (×2): 160 ug via INTRAVENOUS
  Administered 2021-09-26: 80 ug via INTRAVENOUS

## 2021-09-26 MED ORDER — MORPHINE SULFATE (PF) 2 MG/ML IV SOLN: 0.5000 mg | INTRAVENOUS | Status: DC | PRN

## 2021-09-26 MED ORDER — METOPROLOL SUCCINATE ER 50 MG PO TB24
50.0000 mg | ORAL_TABLET | Freq: Every day | ORAL | Status: DC
  Administered 2021-09-27 – 2021-10-03 (×7): 50 mg via ORAL
  Filled 2021-09-26 (×8): qty 1

## 2021-09-26 MED ORDER — SERTRALINE HCL 50 MG PO TABS
50.0000 mg | ORAL_TABLET | Freq: Every day | ORAL | Status: DC
  Administered 2021-09-27 – 2021-10-03 (×7): 50 mg via ORAL
  Filled 2021-09-26 (×8): qty 1

## 2021-09-26 MED ORDER — ACETAMINOPHEN 325 MG PO TABS
325.0000 mg | ORAL_TABLET | Freq: Four times a day (QID) | ORAL | Status: DC | PRN
  Filled 2021-09-26: qty 2

## 2021-09-26 MED ORDER — CEFAZOLIN SODIUM-DEXTROSE 2-4 GM/100ML-% IV SOLN
INTRAVENOUS | Status: AC
  Filled 2021-09-26: qty 100

## 2021-09-26 MED ORDER — INSULIN ASPART 100 UNIT/ML IJ SOLN
3.0000 [IU] | Freq: Once | INTRAMUSCULAR | Status: AC
  Administered 2021-09-26: 3 [IU] via SUBCUTANEOUS

## 2021-09-26 MED ORDER — ACETAMINOPHEN 10 MG/ML IV SOLN: 1000.0000 mg | Freq: Once | INTRAVENOUS | Status: DC | PRN

## 2021-09-26 MED ORDER — TRANEXAMIC ACID-NACL 1000-0.7 MG/100ML-% IV SOLN
INTRAVENOUS | Status: AC
  Administered 2021-09-26: 1000 mg via INTRAVENOUS
  Filled 2021-09-26: qty 100

## 2021-09-26 MED ORDER — TORSEMIDE 20 MG PO TABS: 20.0000 mg | ORAL_TABLET | Freq: Every day | ORAL | Status: DC | PRN

## 2021-09-26 MED ORDER — PROPOFOL 500 MG/50ML IV EMUL
INTRAVENOUS | Status: DC | PRN
  Administered 2021-09-26: 25 ug/kg/min via INTRAVENOUS

## 2021-09-26 MED ORDER — 0.9 % SODIUM CHLORIDE (POUR BTL) OPTIME
TOPICAL | Status: DC | PRN
  Administered 2021-09-26: 1000 mL

## 2021-09-26 MED ORDER — FENTANYL CITRATE (PF) 100 MCG/2ML IJ SOLN
INTRAMUSCULAR | Status: AC
  Filled 2021-09-26: qty 2

## 2021-09-26 MED ORDER — FENTANYL CITRATE PF 50 MCG/ML IJ SOSY: 25.0000 ug | PREFILLED_SYRINGE | INTRAMUSCULAR | Status: DC | PRN

## 2021-09-26 MED ORDER — CEFAZOLIN SODIUM-DEXTROSE 2-4 GM/100ML-% IV SOLN
2.0000 g | INTRAVENOUS | Status: AC
  Administered 2021-09-26: 2 g via INTRAVENOUS

## 2021-09-26 MED ORDER — OXYCODONE HCL 5 MG PO TABS: 5.0000 mg | ORAL_TABLET | Freq: Once | ORAL | Status: DC | PRN

## 2021-09-26 MED ORDER — GLYCOPYRROLATE 0.2 MG/ML IJ SOLN
INTRAMUSCULAR | Status: DC | PRN
  Administered 2021-09-26: .2 mg via INTRAVENOUS

## 2021-09-26 MED ORDER — METHOCARBAMOL 500 MG PO TABS: 500.0000 mg | ORAL_TABLET | Freq: Four times a day (QID) | ORAL | Status: DC | PRN

## 2021-09-26 MED ORDER — OXYCODONE HCL 5 MG/5ML PO SOLN: 5.0000 mg | Freq: Once | ORAL | Status: DC | PRN

## 2021-09-26 MED ORDER — GLUCERNA SHAKE PO LIQD
237.0000 mL | Freq: Three times a day (TID) | ORAL | Status: DC
  Administered 2021-09-27 – 2021-09-28 (×4): 237 mL via ORAL
  Filled 2021-09-26: qty 237

## 2021-09-26 MED ORDER — TRAMADOL HCL 50 MG PO TABS
50.0000 mg | ORAL_TABLET | Freq: Four times a day (QID) | ORAL | Status: DC
  Administered 2021-09-26 – 2021-09-28 (×6): 50 mg via ORAL
  Filled 2021-09-26 (×6): qty 1

## 2021-09-26 MED ORDER — PROPOFOL 10 MG/ML IV BOLUS
INTRAVENOUS | Status: DC | PRN
  Administered 2021-09-26 (×3): 20 mg via INTRAVENOUS

## 2021-09-26 MED ORDER — PHENYLEPHRINE HCL-NACL 20-0.9 MG/250ML-% IV SOLN
INTRAVENOUS | Status: DC | PRN
  Administered 2021-09-26: 40 ug/min via INTRAVENOUS

## 2021-09-26 MED ORDER — SENNA 8.6 MG PO TABS
1.0000 | ORAL_TABLET | Freq: Two times a day (BID) | ORAL | Status: DC
  Administered 2021-09-26 – 2021-10-03 (×13): 8.6 mg via ORAL
  Filled 2021-09-26 (×14): qty 1

## 2021-09-26 MED ORDER — METHOCARBAMOL 1000 MG/10ML IJ SOLN: 500.0000 mg | Freq: Four times a day (QID) | INTRAVENOUS | Status: DC | PRN

## 2021-09-26 MED ORDER — TRANEXAMIC ACID-NACL 1000-0.7 MG/100ML-% IV SOLN: 1000.0000 mg | Freq: Once | INTRAVENOUS | Status: AC

## 2021-09-26 MED ORDER — HYDROMORPHONE HCL 1 MG/ML IJ SOLN
0.5000 mg | Freq: Once | INTRAMUSCULAR | Status: AC
  Administered 2021-09-26: 0.5 mg via INTRAVENOUS
  Filled 2021-09-26: qty 0.5

## 2021-09-26 MED ORDER — ACETAMINOPHEN 10 MG/ML IV SOLN
INTRAVENOUS | Status: DC | PRN
  Administered 2021-09-26: 500 mg via INTRAVENOUS

## 2021-09-26 MED ORDER — ENOXAPARIN SODIUM 40 MG/0.4ML IJ SOSY
40.0000 mg | PREFILLED_SYRINGE | INTRAMUSCULAR | Status: DC
  Administered 2021-09-27 – 2021-09-28 (×2): 40 mg via SUBCUTANEOUS
  Filled 2021-09-26 (×2): qty 0.4

## 2021-09-26 MED ORDER — EPHEDRINE SULFATE (PRESSORS) 50 MG/ML IJ SOLN
INTRAMUSCULAR | Status: DC | PRN
  Administered 2021-09-26: 15 mg via INTRAVENOUS
  Administered 2021-09-26: 10 mg via INTRAVENOUS

## 2021-09-26 MED ORDER — ONDANSETRON HCL 4 MG PO TABS: 4.0000 mg | ORAL_TABLET | Freq: Four times a day (QID) | ORAL | Status: DC | PRN

## 2021-09-26 MED ORDER — LENALIDOMIDE 10 MG PO CAPS: 10.0000 mg | ORAL_CAPSULE | Freq: Every day | ORAL | Status: DC

## 2021-09-26 MED ORDER — INSULIN ASPART 100 UNIT/ML IJ SOLN
INTRAMUSCULAR | Status: AC
  Filled 2021-09-26: qty 1

## 2021-09-26 MED ORDER — ONDANSETRON HCL 4 MG/2ML IJ SOLN: 4.0000 mg | Freq: Four times a day (QID) | INTRAMUSCULAR | Status: DC | PRN

## 2021-09-26 MED ORDER — PHENYLEPHRINE HCL (PRESSORS) 10 MG/ML IV SOLN
INTRAVENOUS | Status: AC
  Filled 2021-09-26: qty 1

## 2021-09-26 MED ORDER — HYDRALAZINE HCL 20 MG/ML IJ SOLN
5.0000 mg | INTRAMUSCULAR | Status: DC | PRN
  Administered 2021-09-26: 5 mg via INTRAVENOUS
  Filled 2021-09-26: qty 1

## 2021-09-26 MED ORDER — GABAPENTIN 100 MG PO CAPS
100.0000 mg | ORAL_CAPSULE | Freq: Every day | ORAL | Status: DC
  Administered 2021-09-26 – 2021-10-02 (×7): 100 mg via ORAL
  Filled 2021-09-26 (×7): qty 1

## 2021-09-26 MED ORDER — CEFAZOLIN SODIUM-DEXTROSE 2-4 GM/100ML-% IV SOLN
2.0000 g | Freq: Four times a day (QID) | INTRAVENOUS | Status: AC
  Administered 2021-09-26 – 2021-09-27 (×2): 2 g via INTRAVENOUS
  Filled 2021-09-26 (×2): qty 100

## 2021-09-26 MED ORDER — ACYCLOVIR 800 MG PO TABS
400.0000 mg | ORAL_TABLET | Freq: Two times a day (BID) | ORAL | Status: DC
  Administered 2021-09-26: 400 mg via ORAL
  Filled 2021-09-26 (×2): qty 1

## 2021-09-26 MED ORDER — PANTOPRAZOLE SODIUM 40 MG PO TBEC
40.0000 mg | DELAYED_RELEASE_TABLET | Freq: Every day | ORAL | Status: DC
  Administered 2021-09-26 – 2021-10-03 (×8): 40 mg via ORAL
  Filled 2021-09-26 (×8): qty 1

## 2021-09-26 MED ORDER — AMLODIPINE BESYLATE 5 MG PO TABS
5.0000 mg | ORAL_TABLET | Freq: Every day | ORAL | Status: DC
  Administered 2021-09-27 – 2021-10-03 (×6): 5 mg via ORAL
  Filled 2021-09-26 (×8): qty 1

## 2021-09-26 MED ORDER — ALUM & MAG HYDROXIDE-SIMETH 200-200-20 MG/5ML PO SUSP: 30.0000 mL | ORAL | Status: DC | PRN

## 2021-09-26 MED ORDER — ALBUTEROL SULFATE (2.5 MG/3ML) 0.083% IN NEBU: 3.0000 mL | INHALATION_SOLUTION | RESPIRATORY_TRACT | Status: DC | PRN

## 2021-09-26 MED ORDER — POLYETHYLENE GLYCOL 3350 17 G PO PACK
17.0000 g | PACK | Freq: Every day | ORAL | Status: DC | PRN
Start: 2021-09-26 — End: 2021-10-03
  Administered 2021-09-27 – 2021-10-01 (×2): 17 g via ORAL
  Filled 2021-09-26 (×3): qty 1

## 2021-09-26 MED ORDER — CHLORHEXIDINE GLUCONATE CLOTH 2 % EX PADS
6.0000 | MEDICATED_PAD | Freq: Every day | CUTANEOUS | Status: DC
  Administered 2021-09-27 – 2021-10-03 (×7): 6 via TOPICAL

## 2021-09-26 MED ORDER — BISACODYL 10 MG RE SUPP
10.0000 mg | Freq: Every day | RECTAL | Status: DC | PRN
  Administered 2021-10-02: 10 mg via RECTAL
  Filled 2021-09-26: qty 1

## 2021-09-26 MED ORDER — STERILE WATER FOR IRRIGATION IR SOLN
Status: DC | PRN
  Administered 2021-09-26: 1000 mL

## 2021-09-26 MED ORDER — ONDANSETRON HCL 4 MG/2ML IJ SOLN
INTRAMUSCULAR | Status: DC | PRN
  Administered 2021-09-26: 4 mg via INTRAVENOUS

## 2021-09-26 MED ORDER — PROPOFOL 10 MG/ML IV BOLUS
INTRAVENOUS | Status: AC
  Filled 2021-09-26: qty 20

## 2021-09-26 MED ORDER — KETAMINE HCL 10 MG/ML IJ SOLN
INTRAMUSCULAR | Status: DC | PRN
  Administered 2021-09-26: 20 mg via INTRAVENOUS

## 2021-09-26 MED ORDER — ACETAMINOPHEN 500 MG PO TABS
500.0000 mg | ORAL_TABLET | Freq: Four times a day (QID) | ORAL | Status: AC
  Administered 2021-09-27 (×3): 500 mg via ORAL
  Filled 2021-09-26 (×4): qty 1

## 2021-09-26 MED ORDER — KETAMINE HCL 50 MG/5ML IJ SOSY
PREFILLED_SYRINGE | INTRAMUSCULAR | Status: AC
  Filled 2021-09-26: qty 5

## 2021-09-26 MED ORDER — PROMETHAZINE HCL 25 MG/ML IJ SOLN: 6.2500 mg | INTRAMUSCULAR | Status: DC | PRN

## 2021-09-26 MED ORDER — ADULT MULTIVITAMIN W/MINERALS CH
1.0000 | ORAL_TABLET | Freq: Every day | ORAL | Status: DC
  Administered 2021-09-27 – 2021-10-03 (×7): 1 via ORAL
  Filled 2021-09-26 (×8): qty 1

## 2021-09-26 MED ORDER — POTASSIUM CHLORIDE CRYS ER 20 MEQ PO TBCR
20.0000 meq | EXTENDED_RELEASE_TABLET | Freq: Two times a day (BID) | ORAL | Status: DC
  Administered 2021-09-26 – 2021-10-03 (×14): 20 meq via ORAL
  Filled 2021-09-26 (×14): qty 1

## 2021-09-26 SURGICAL SUPPLY — 47 items
BIT DRILL CROWE POINT TWST 4.3 (DRILL) IMPLANT
BNDG COHESIVE 6X5 TAN ST LF (GAUZE/BANDAGES/DRESSINGS) ×5 IMPLANT
DRAPE 3/4 80X56 (DRAPES) ×4 IMPLANT
DRAPE SURG 17X11 SM STRL (DRAPES) ×4 IMPLANT
DRAPE U-SHAPE 47X51 STRL (DRAPES) ×4 IMPLANT
DRILL CROWE POINT TWIST 4.3 (DRILL) ×2
DRSG OPSITE POSTOP 3X4 (GAUZE/BANDAGES/DRESSINGS) ×4 IMPLANT
DRSG OPSITE POSTOP 4X14 (GAUZE/BANDAGES/DRESSINGS) IMPLANT
DRSG OPSITE POSTOP 4X6 (GAUZE/BANDAGES/DRESSINGS) ×2 IMPLANT
DURAPREP 26ML APPLICATOR (WOUND CARE) ×6 IMPLANT
ELECT REM PT RETURN 9FT ADLT (ELECTROSURGICAL) ×2
ELECTRODE REM PT RTRN 9FT ADLT (ELECTROSURGICAL) ×1 IMPLANT
GAUZE XEROFORM 1X8 LF (GAUZE/BANDAGES/DRESSINGS) ×1 IMPLANT
GLOVE BIOGEL PI IND STRL 9 (GLOVE) ×1 IMPLANT
GLOVE BIOGEL PI INDICATOR 9 (GLOVE) ×1
GLOVE SURG ORTHO 9.0 STRL STRW (GLOVE) ×4 IMPLANT
GOWN STRL REUS TWL 2XL XL LVL4 (GOWN DISPOSABLE) ×2 IMPLANT
GOWN STRL REUS W/ TWL LRG LVL3 (GOWN DISPOSABLE) ×1 IMPLANT
GOWN STRL REUS W/TWL LRG LVL3 (GOWN DISPOSABLE) ×1
GUIDEPIN VERSANAIL DSP 3.2X444 (ORTHOPEDIC DISPOSABLE SUPPLIES) ×1 IMPLANT
GUIDEWIRE BALL NOSE 100CM (WIRE) ×1 IMPLANT
HEMOVAC 400CC 10FR (MISCELLANEOUS) IMPLANT
HFN RH 130 DEG 9MM X 360MM (Nail) ×1 IMPLANT
HIP FRA NAIL LAG SCREW 10.5X90 (Orthopedic Implant) ×2 IMPLANT
HOLDER FOLEY CATH W/STRAP (MISCELLANEOUS) ×1 IMPLANT
KIT TURNOVER CYSTO (KITS) ×2 IMPLANT
MANIFOLD NEPTUNE II (INSTRUMENTS) ×2 IMPLANT
MAT ABSORB  FLUID 56X50 GRAY (MISCELLANEOUS) ×1
MAT ABSORB FLUID 56X50 GRAY (MISCELLANEOUS) ×1 IMPLANT
NS IRRIG 1000ML POUR BTL (IV SOLUTION) ×1 IMPLANT
NS IRRIG 500ML POUR BTL (IV SOLUTION) ×1 IMPLANT
PACK HIP COMPR (MISCELLANEOUS) ×2 IMPLANT
PAD ARMBOARD 7.5X6 YLW CONV (MISCELLANEOUS) ×2 IMPLANT
SCREW BONE CORTICAL 5.0X42 (Screw) ×1 IMPLANT
SCREW LAG HIP FRA NAIL 10.5X90 (Orthopedic Implant) IMPLANT
STAPLER SKIN PROX 35W (STAPLE) ×2 IMPLANT
SUCTION FRAZIER HANDLE 10FR (MISCELLANEOUS) ×1
SUCTION TUBE FRAZIER 10FR DISP (MISCELLANEOUS) ×1 IMPLANT
SUT VIC AB 0 CT1 36 (SUTURE) ×4 IMPLANT
SUT VIC AB 2-0 CT1 (SUTURE) ×1 IMPLANT
SUT VIC AB 2-0 CT1 27 (SUTURE) ×1
SUT VIC AB 2-0 CT1 TAPERPNT 27 (SUTURE) ×1 IMPLANT
SUT VICRYL 0 AB UR-6 (SUTURE) ×2 IMPLANT
SYR 30ML LL (SYRINGE) ×2 IMPLANT
TRAY FOLEY MTR SLVR 16FR STAT (SET/KITS/TRAYS/PACK) ×1 IMPLANT
WATER STERILE IRR 1000ML POUR (IV SOLUTION) ×1 IMPLANT
WATER STERILE IRR 500ML POUR (IV SOLUTION) ×1 IMPLANT

## 2021-09-26 NOTE — H&P (Signed)
History and Physical    Patient: Nancy Rodriguez DOB: [birth year] DOA: 09/26/2021 DOS: the patient was seen and examined on 09/26/2021 PCP: Leonel Ramsay, MD  Patient coming from: Home  Chief Complaint:  Chief Complaint  Patient presents with   Fall    HPI: Nancy Rodriguez is a 76 y.o. 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  She did not lose consciousness.  She was previously in her usual state of health. ED course and data review: BP 191/81 with otherwise normal vitals Blood work: Pending IV team consulted EKG: Pending Chest x-ray clear Hip x-ray: Displaced right proximal femoral shaft fracture CT head and C-spine nonacute  The ED provider spoke with orthopedist, Dr. Mack Guise who would like to take patient to the OR later today Hospitalist consulted for admission.   Review of Systems: As mentioned in the history of present illness. All other systems reviewed and are negative.  Past Medical History:  Diagnosis Date   (HFpEF) heart failure with preserved ejection fraction ([place])    a. 05/2020 Echo: EF 60-65%; b. 08/2020 cMRI ([facility]): EF 71%, no delayed hyperenhancement to suggest scar/infiltrative dzs. Nl RV fxn. Mild BAE. Triv MR, mild TR; c. 06/2021 Echo: EF 50-55%, no rwma, mild basal-septal LVH, GrII DD. Nl RV size/fxn. Mild MR. Ao sclerosis.   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 03/2012 Stress Echo([facility]): nl  study.   Hypercholesterolemia    Hypertension    MGUS (monoclonal gammopathy of unknown significance)    Osteoarthritis    Rheumatoid arthritis ([place])    Past Surgical History:  Procedure Laterality Date   HERNIA REPAIR     PARATHYROIDECTOMY     Social History:  reports that she has never smoked. She has never used smokeless tobacco. She reports that she does not drink alcohol and does not use drugs.  Allergies  Allergen Reactions   Benazepril Anaphylaxis and Swelling    TONGUE AND LIPS     Lisinopril Swelling   Tolmetin Swelling   Nsaids Swelling    Family History  Problem Relation Age of Onset   Diabetes Daughter    Diabetes Son    Dementia Mother    Cancer Father    Esophageal cancer Sister    Brain cancer Brother     Prior to Admission medications   Medication Sig Start Date End Date Taking? Authorizing Provider  acyclovir (ZOVIRAX) 400 MG tablet Take 1 tablet (400 mg total) by mouth 2 (two) times daily. 06/23/21   [name], MD  albuterol (VENTOLIN HFA) 108 (90 Base) MCG/ACT inhaler Inhale 2 puffs into the lungs every 4 (four) hours as needed for wheezing or shortness of breath. 06/21/15   [provider]  amLODipine (NORVASC) 5 MG tablet Take 5 mg by mouth daily. 11/26/19   [provider]  Continuous Blood Gluc Sensor (FREESTYLE LIBRE 14 DAY SENSOR) MISC SMARTSIG:1 Kit(s) Topical Every 2 Weeks 10/28/20   [provider]  [employer] VITAMIN C 500 MG tablet TAKE 1 TABLET BY MOUTH DAILY 04/27/21   [name], MD  dexamethasone (DECADRON) 4 MG tablet TAKE 5 TABLETS BY MOUTH ONCE A WEEK. Patient not taking: Reported on 09/18/2021 04/29/21   [name], MD  EPINEPHrine 0.3 mg/0.3 mL IJ SOAJ injection Inject 0.3 mg into the muscle as needed. Patient not taking: Reported on 06/23/2021 05/06/09   [provider]  gabapentin (NEURONTIN) 100 MG capsule Take 100 mg by mouth at bedtime.    [provider]  insulin glargine (LANTUS SOLOSTAR) 100 UNIT/ML  Solostar Pen Inject 15 Units into the skin at bedtime.    [provider]  insulin lispro (HUMALOG) 100 UNIT/ML injection Inject into the skin 3 (three) times daily before meals. Sliding scale    [provider]  KLOR-CON M20 20 MEQ tablet TAKE 1 TABLET BY MOUTH TWICE A DAY 08/01/21   [name], MD  lenalidomide (REVLIMID) 10 MG capsule Take 1 capsule (10 mg total) by mouth daily. Take for 21 days, then hold for 7 days. Repeat every 28 days. Patient not taking: Reported on 09/18/2021 09/12/21   [name], MD  loperamide (IMODIUM) 2 MG capsule Take 1 capsule (2 mg total) by mouth See admin instructions. Initial: 4 mg, followed by 2 mg after each loose stool, maximum 16 mg within 24 hours. Patient not taking: Reported on 07/17/2021 04/27/21   [name], MD  metoprolol succinate (TOPROL-XL) 50 MG 24 hr tablet Take 50 mg by mouth daily.    [provider]  omeprazole (PRILOSEC) 40 MG capsule Take 40 mg by mouth daily.    [provider]  oxyCODONE-acetaminophen (PERCOCET) 5-325 MG tablet Take 1 tablet by mouth every 6 (six) hours as needed for severe pain. 09/19/21 09/19/22  Blake Divine, MD  sertraline (ZOLOFT) 25 MG tablet Take 50 mg by mouth daily. 11/22/20   [provider]  torsemide (DEMADEX) 20 MG tablet Take 20 mg by mouth daily as needed.    [provider]    Physical Exam: Vitals:   09/26/21 0147 09/26/21 0315 09/26/21 0330 09/26/21 0430  BP:  (!) 182/77 (!) 180/82 (!) 175/85  Pulse:  93 91 90  Resp:  19 (!) 21 (!) 23  Temp:      TempSrc:      SpO2:  98% 98% 98%  Weight: 55.3 kg     Height: 5' 3" (1.6 m)      Physical Exam Vitals and nursing note reviewed.  Constitutional:      General: She is awake. She is not in acute distress.    Appearance: She is cachectic.  HENT:     Head: Normocephalic and atraumatic.  Cardiovascular:     Rate and Rhythm: Normal rate and regular rhythm.     Heart sounds: Normal heart sounds.  Pulmonary:      Effort: Pulmonary effort is normal.     Breath sounds: Normal breath sounds.  Abdominal:     General: There is distension.     Palpations: Abdomen is soft.     Tenderness: There is no abdominal tenderness.  Neurological:     Mental Status: Mental status is at baseline.     Labs on Admission: I have personally reviewed following labs and imaging studies  CBC: No results for input(s): "WBC", "NEUTROABS", "HGB", "HCT", "MCV", "PLT" in the last 168 hours. Basic Metabolic Panel: No results for input(s): "NA", "K", "CL", "CO2", "GLUCOSE", "BUN", "CREATININE", "CALCIUM", "  MG", "PHOS" in the last 168 hours. GFR: Estimated Creatinine Clearance: 34.8 mL/min (A) (by C-G formula based on SCr of 1.12 mg/dL (H)). Liver Function Tests: No results for input(s): "AST", "ALT", "ALKPHOS", "BILITOT", "PROT", "ALBUMIN" in the last 168 hours. No results for input(s): "LIPASE", "AMYLASE" in the last 168 hours. No results for input(s): "AMMONIA" in the last 168 hours. Coagulation Profile: No results for input(s): "INR", "PROTIME" in the last 168 hours. Cardiac Enzymes: No results for input(s): "CKTOTAL", "CKMB", "CKMBINDEX", "TROPONINI" in the last 168 hours. BNP (last 3 results) No results for input(s): "PROBNP" in the last 8760 hours. HbA1C: No results for input(s): "HGBA1C" in the last 72 hours. CBG: No results for input(s): "GLUCAP" in the last 168 hours. Lipid Profile: No results for input(s): "CHOL", "HDL", "LDLCALC", "TRIG", "CHOLHDL", "LDLDIRECT" in the last 72 hours. Thyroid Function Tests: No results for input(s): "TSH", "T4TOTAL", "FREET4", "T3FREE", "THYROIDAB" in the last 72 hours. Anemia Panel: No results for input(s): "VITAMINB12", "FOLATE", "FERRITIN", "TIBC", "IRON", "RETICCTPCT" in the last 72 hours. Urine analysis:    Component Value Date/Time   COLORURINE AMBER (A) 09/19/2021 1318   APPEARANCEUR CLEAR (A) 09/19/2021 1318   LABSPEC 1.022 09/19/2021 1318   PHURINE 5.0  09/19/2021 1318   GLUCOSEU 50 (A) 09/19/2021 1318   HGBUR NEGATIVE 09/19/2021 1318   BILIRUBINUR NEGATIVE 09/19/2021 1318   KETONESUR NEGATIVE 09/19/2021 1318   PROTEINUR >=300 (A) 09/19/2021 1318   NITRITE NEGATIVE 09/19/2021 1318   LEUKOCYTESUR NEGATIVE 09/19/2021 1318    Radiological Exams on Admission: DG Chest Portable 1 View  Result Date: 09/26/2021 CLINICAL DATA:  Fall, preop EXAM: PORTABLE CHEST 1 VIEW COMPARISON:  None Available. FINDINGS: Elevation of the right hemidiaphragm. Heart and mediastinal contours are within normal limits. No focal opacities or effusions. No acute bony abnormality. IMPRESSION: No active disease. Electronically Signed   By: Rolm Baptise M.D.   On: 09/26/2021 03:11   DG Hip Unilat W or Wo Pelvis 2-3 Views Right  Result Date: 09/26/2021 CLINICAL DATA:  Fall EXAM: DG HIP (WITH OR WITHOUT PELVIS) 2-3V RIGHT COMPARISON:  None Available. FINDINGS: There is a fracture through the proximal shaft of the right femur. Femoral shaft is displaced medially relative to the femoral neck. No subluxation or dislocation at the hip. IMPRESSION: Displaced right proximal femoral shaft fracture. Electronically Signed   By: Rolm Baptise M.D.   On: 09/26/2021 03:07   CT Cervical Spine Wo Contrast  Result Date: 09/26/2021 CLINICAL DATA:  Facial trauma, blunt.  Unwitnessed fall. EXAM: CT CERVICAL SPINE WITHOUT CONTRAST TECHNIQUE: Multidetector CT imaging of the cervical spine was performed without intravenous contrast. Multiplanar CT image reconstructions were also generated. RADIATION DOSE REDUCTION: This exam was performed according to the departmental dose-optimization program which includes automated exposure control, adjustment of the mA and/or kV according to patient size and/or use of iterative reconstruction technique. COMPARISON:  None Available. FINDINGS: Alignment: No subluxation Skull base and vertebrae: No acute fracture. No primary bone lesion or focal pathologic process.  Soft tissues and spinal canal: No prevertebral fluid or swelling. No visible canal hematoma. Disc levels:  Diffuse degenerative disc disease and facet disease. Upper chest: No acute findings Other: None IMPRESSION: No acute bony abnormality. Electronically Signed   By: Rolm Baptise M.D.   On: 09/26/2021 02:46   CT Head Wo Contrast  Result Date: 09/26/2021 CLINICAL DATA:  Head trauma, minor (Age >= 65y).  Unwitnessed fall EXAM: CT HEAD WITHOUT CONTRAST TECHNIQUE: Contiguous axial images were obtained from  the base of the skull through the vertex without intravenous contrast. RADIATION DOSE REDUCTION: This exam was performed according to the departmental dose-optimization program which includes automated exposure control, adjustment of the mA and/or kV according to patient size and/or use of iterative reconstruction technique. 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 No new hemorrhage, hydrocephalus or acute infarction. No mass effect or midline shift. Mild atrophy. Vascular: No hyperdense vessel or unexpected calcification. Skull: No acute calvarial abnormality. Sinuses/Orbits: No acute findings Other: None IMPRESSION: Small bilateral chronic subdural hematomas, stable or slightly smaller since prior study. No acute intracranial abnormality. Electronically Signed   By: Rolm Baptise M.D.   On: 09/26/2021 02:45     Data Reviewed: Relevant notes from primary care and specialist visits, past discharge summaries as available in EHR, including Care Everywhere. Prior diagnostic testing as pertinent to current admission diagnoses Updated medications and problem lists for reconciliation ED course, including vitals, labs, imaging, treatment and response to treatment Triage notes, nursing and pharmacy notes and ED provider's notes Notable results as noted in HPI   Assessment  and Plan: * Fracture of femoral shaft, right, closed ([place]) Mechanical fall with history of frequent falls Dr. Mack Guise, Ortho would like to take patient to the OR later today We will keep n.p.o.  SCDs for DVT prophylaxis 6 Preoperative clearance pending Pain control  Preoperative clearance Preoperative clearance is pending Blood work including CMP and CBC pending at the time of admission Chest x-ray with no active disease.  EKG pending Patient had a cardiac work-up that included cardiac MRI and echocardiogram in May 2022 that showed no evidence of amyloid.  EF was 71% Patient ambulates at baseline but with high fall risk so will benefit to early repair. Low to low moderate risk of cardiopulmonary events Overall poor prognostic outlook based on poor nutritional status and chronic medical conditions   Protein-calorie malnutrition, severe ([place]) Patient likely with severe protein calorie malnutrition Nutritionist evaluation  Hypertensive urgency BP 180/82 Hydralazine IV as needed while n.p.o.  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HCC) On chemotherapy revlimid, dexamethasone and daratumumab.  Last seen by Dr. Tasia Catchings on 6/5 Most recent labs from 6/5 showing AST 47, ALT 30, alk phos 898 with total bilirubin of 4.7  Frailty Based on age, frequent falls, chronic medical conditions and malnutrition  Chronic kidney disease, stage 3a ([place]) Creatinine 1.12 on 6/5 Follow-up admission labs, still pending  Diabetes mellitus without complication (HCC) Sliding scale insulin coverage        DVT prophylaxis: SCD  Consults: Orthopedics, Dr. Mack Guise  Advance Care Planning:   Code Status: Full Code   Family Communication: Daughter at bedside.  Answered several questions   Disposition Plan: Back to previous home environment  Severity of Illness: The appropriate patient status for this patient is INPATIENT. Inpatient status is judged to be reasonable and necessary in order to provide the required intensity  of service to ensure the patient's safety. The patient's presenting symptoms, physical exam findings, and initial radiographic and laboratory data in the context of their chronic comorbidities is felt to place them at high risk for further clinical deterioration. Furthermore, it is not anticipated that the patient will be medically stable for discharge from the hospital within 2 midnights of admission.   * I certify that at the point of admission it is my clinical judgment that the patient will require inpatient hospital care spanning beyond 2  midnights from the point of admission due to high intensity of service, high risk for further deterioration and high frequency of surveillance required.*  Author: Athena Masse, MD 09/26/2021 5:14 AM  For on call review [email or web address].

## 2021-09-26 NOTE — Consult Note (Signed)
ORTHOPAEDIC CONSULTATION  REQUESTING PHYSICIAN: No att. providers found  Chief Complaint: Right hip pain status post fall  HPI: Nancy Rodriguez is a 76 y.o. female with a history of heart failure, diabetes, amyloidosis and chronic kidney disease presented to the [facility] regional emergency department overnight after an unwitnessed fall.  Patient lives with her daughter.  Patient was brought to the [facility] by EMS and was diagnosed with a right displaced subtrochanteric proximal femur fracture.  Orthopedics is consulted for management of the fracture.  I saw the patient this morning at approximately 7 AM with the patient's daughter at the bedside.  Patient was admitted to the hospital service overnight for preoperative clearance.  Patient was complaining of right hip pain.  Past Medical History:  Diagnosis Date   (HFpEF) heart failure with preserved ejection fraction ([place])    a. 05/2020 Echo: EF 60-65%; b. 08/2020 cMRI ([facility]): EF 71%, no delayed hyperenhancement to suggest scar/infiltrative dzs. Nl RV fxn. Mild BAE. Triv MR, mild TR; c. 06/2021 Echo: EF 50-55%, no rwma, mild basal-septal LVH, GrII DD. Nl RV size/fxn. Mild MR. Ao sclerosis.   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 03/2012 Stress Echo([facility]): nl study.   Hypercholesterolemia    Hypertension    MGUS (monoclonal gammopathy of unknown significance)    Osteoarthritis    Rheumatoid arthritis ([place])    Past Surgical History:  Procedure Laterality Date   HERNIA REPAIR     PARATHYROIDECTOMY     Social History   Socioeconomic History   Marital status: Widowed    Spouse name: Not on file   Number of children: 6   Years of education: Not on file   Highest education level: Not on file  Occupational  History   Not on file  Tobacco Use   Smoking status: Never   Smokeless tobacco: Never  Vaping Use   Vaping Use: Never used  Substance and Sexual Activity   Alcohol use: No   Drug use: Never   Sexual activity: Not on file  Other Topics Concern   Not on file  Social History Narrative   Not on file   Social Determinants of Health   Financial Resource Strain: Not on file  Food Insecurity: Not on file  Transportation Needs: Not on file  Physical Activity: Not on file  Stress: Not on file  Social Connections: Not on file   Family History  Problem Relation Age of Onset   Diabetes Daughter    Diabetes Son    Dementia Mother    Cancer Father    Esophageal cancer Sister    Brain cancer Brother    Allergies  Allergen Reactions   Benazepril Anaphylaxis and Swelling    TONGUE AND LIPS     Lisinopril Swelling   Tolmetin Swelling   Nsaids Swelling   Prior to Admission medications   Medication Sig Start Date End Date Taking? Authorizing Provider  acyclovir (ZOVIRAX) 400 MG tablet Take 1 tablet (400 mg total) by mouth 2 (two) times daily. 06/23/21  Yes [name], MD  amLODipine (NORVASC) 5 MG tablet Take 5 mg by mouth daily. 11/26/19  Yes [provider]  [employer] VITAMIN C 500 MG tablet TAKE 1 TABLET BY MOUTH DAILY  04/27/21  Yes [name], MD  dexamethasone (DECADRON) 4 MG tablet TAKE 5 TABLETS BY MOUTH ONCE A WEEK. 04/29/21  Yes [name], MD  EPINEPHrine 0.3 mg/0.3 mL IJ SOAJ injection Inject 0.3 mg into the muscle as needed. 05/06/09  Yes [provider]  ferrous sulfate 325 (65 FE) MG EC tablet Take 325 mg by mouth daily.   Yes [provider]  gabapentin (NEURONTIN) 100 MG capsule Take 100 mg by mouth at bedtime.   Yes [provider]  insulin glargine (LANTUS SOLOSTAR) 100 UNIT/ML Solostar Pen Inject 15 Units into the skin at bedtime.   Yes [provider]  insulin lispro (HUMALOG) 100 UNIT/ML injection Inject into the skin 3 (three) times  daily before meals. Sliding scale   Yes [provider]  KLOR-CON M20 20 MEQ tablet TAKE 1 TABLET BY MOUTH TWICE A DAY 08/01/21  Yes [name], MD  lenalidomide (REVLIMID) 10 MG capsule Take 1 capsule (10 mg total) by mouth daily. Take for 21 days, then hold for 7 days. Repeat every 28 days. 09/12/21  Yes [name], MD  loperamide (IMODIUM) 2 MG capsule Take 1 capsule (2 mg total) by mouth See admin instructions. Initial: 4 mg, followed by 2 mg after each loose stool, maximum 16 mg within 24 hours. 04/27/21  Yes [name], MD  metoprolol succinate (TOPROL-XL) 50 MG 24 hr tablet Take 50 mg by mouth daily.   Yes [provider]  sertraline (ZOLOFT) 25 MG tablet Take 50 mg by mouth daily. 11/22/20  Yes [provider]  torsemide (DEMADEX) 20 MG tablet Take 20 mg by mouth daily as needed.   Yes [provider]  albuterol (VENTOLIN HFA) 108 (90 Base) MCG/ACT inhaler Inhale 2 puffs into the lungs every 4 (four) hours as needed for wheezing or shortness of breath. 06/21/15   [provider]  Continuous Blood Gluc Sensor (FREESTYLE LIBRE 14 DAY SENSOR) MISC SMARTSIG:1 Kit(s) Topical Every 2 Weeks 10/28/20   [provider]  omeprazole (PRILOSEC) 40 MG capsule Take 40 mg by mouth 2 (two) times daily.    [provider]  oxyCODONE-acetaminophen (PERCOCET) 5-325 MG tablet Take 1 tablet by mouth every 6 (six) hours as needed for severe pain. Patient not taking: Reported on 09/26/2021 09/19/21 09/19/22  Blake Divine, MD   DG Chest Portable 1 View  Result Date: 09/26/2021 CLINICAL DATA:  Fall, preop EXAM: PORTABLE CHEST 1 VIEW COMPARISON:  None Available. FINDINGS: Elevation of the right hemidiaphragm. Heart and mediastinal contours are within normal limits. No focal opacities or effusions. No acute bony abnormality. IMPRESSION: No active disease. Electronically Signed   By: Rolm Baptise M.D.   On: 09/26/2021 03:11   DG Hip Unilat W or Wo Pelvis 2-3 Views  Right  Result Date: 09/26/2021 CLINICAL DATA:  Fall EXAM: DG HIP (WITH OR WITHOUT PELVIS) 2-3V RIGHT COMPARISON:  None Available. FINDINGS: There is a fracture through the proximal shaft of the right femur. Femoral shaft is displaced medially relative to the femoral neck. No subluxation or dislocation at the hip. IMPRESSION: Displaced right proximal femoral shaft fracture. Electronically Signed   By: Rolm Baptise M.D.   On: 09/26/2021 03:07   CT Cervical Spine Wo Contrast  Result Date: 09/26/2021 CLINICAL DATA:  Facial trauma, blunt.  Unwitnessed fall. EXAM: CT CERVICAL SPINE WITHOUT CONTRAST TECHNIQUE: Multidetector CT imaging of the cervical spine was performed without intravenous contrast. Multiplanar CT image reconstructions were also generated. RADIATION DOSE REDUCTION: This  exam was performed according to the departmental dose-optimization program which includes automated exposure control, adjustment of the mA and/or kV according to patient size and/or use of iterative reconstruction technique. COMPARISON:  None Available. FINDINGS: Alignment: No subluxation Skull base and vertebrae: No acute fracture. No primary bone lesion or focal pathologic process. Soft tissues and spinal canal: No prevertebral fluid or swelling. No visible canal hematoma. Disc levels:  Diffuse degenerative disc disease and facet disease. Upper chest: No acute findings Other: None IMPRESSION: No acute bony abnormality. Electronically Signed   By: Rolm Baptise M.D.   On: 09/26/2021 02:46   CT Head Wo Contrast  Result Date: 09/26/2021 CLINICAL DATA:  Head trauma, minor (Age >= 65y).  Unwitnessed fall EXAM: CT HEAD WITHOUT CONTRAST TECHNIQUE: Contiguous axial images were obtained from the base of the skull through the vertex without intravenous contrast. RADIATION DOSE REDUCTION: This exam was performed according to the departmental dose-optimization program which includes automated exposure control, adjustment of the mA and/or kV  according to patient size and/or use of iterative reconstruction technique. 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 No new hemorrhage, hydrocephalus or acute infarction. No mass effect or midline shift. Mild atrophy. Vascular: No hyperdense vessel or unexpected calcification. Skull: No acute calvarial abnormality. Sinuses/Orbits: No acute findings Other: None IMPRESSION: Small bilateral chronic subdural hematomas, stable or slightly smaller since prior study. No acute intracranial abnormality. Electronically Signed   By: Rolm Baptise M.D.   On: 09/26/2021 02:45    Positive ROS: All other systems have been reviewed and were otherwise negative with the exception of those mentioned in the HPI and as above.  Physical Exam: General: Easily arousable, no acute distress  MUSCULOSKELETAL: Right lower extremity: Patient skin is intact overlying the right hip.  She had shortening and internal rotation of the right lower extremity.  Patient has no significant swelling of her thigh or lower leg.  Her compartments in the thigh and lower leg are soft and compressible.  She has palpable pedal pulses, intact sensation light touch and intact motor function distally.  Assessment: Right displaced reverse obliquity subtrochanteric proximal femur fracture  Plan: I reviewed with the patient and her daughter the nature of her fracture.  I explained that her fracture is completely displaced.  Without surgical fixation the patient would not heal her fracture or be able to ambulate.  I have recommended intramedullary fixation for the patient's fracture.  I described the operation in detail along with the postoperative course to the patient and her daughter.    I discussed the risks and benefits of surgery. They understand the risks include but are not limited to infection, bleeding  requiring blood transfusion, nerve or blood vessel injury, joint stiffness or loss of motion, persistent pain, weakness or instability, malunion, nonunion and hardware failure and the need for further surgery. Medical risks include but are not limited to DVT and pulmonary embolism, myocardial infarction, stroke, pneumonia, respiratory failure and death. Patient and her daughter understood these risks and wished to proceed.   Patient has been cleared medically for surgery.  She has been NPO.   Thornton Park, MD    09/26/2021 1:28 PM

## 2021-09-26 NOTE — Progress Notes (Signed)
Brief note, see H&P done earlier today by Dr. Damita Dunnings, hospitalist   Perioperative risk assessment (was pending at time of H&P given unable to draw labs, EKG pending)  VS as below, increased HR question d/t pain Renal function WNL, slightly elevated glucose  EKG: no acute concerning findings, appears stable from previous other than slightly prolonged  QT Mild anemia, will evaluate further but appears chronic to some degree  WBC elevated 15.2 likely reactive d/t trauma and pt is also on dexamethasone outpatient. Despite increased neutrophils also on differential, no obvious source of infection - no apparent UTI per UA, CXR clear, no other symptoms concerning for infectious process. She reportedly falls frequently, so new infection as cause for new problem is less likely. Reports occasional diarrhea, worse since past few days but not severe, will get stool studies as precaution  Recommendation: SIRS, less likely sepsis as no source infection  Low to moderate risk for low risk procedure, medically no contraindication to proceed w/ surgery, benefit > risk            BP (!) 143/69   Pulse (!) 101   Temp 98 F (36.7 C) (Oral)   Resp 19   Ht '5\' 3"'$  (1.6 m)   Wt 55.3 kg   SpO2 99%   BMI 21.61 kg/m  Respiratory: Normal respiratory effort Breath sounds normal, no wheeze/rhonchi/rales Cardiovascular: S1/S2 normal, no murmur/rub/gallop auscultated No carotid bruit or JVD Pedal pulse II/IV bilaterally DP and PT No lower extremity edema Gastrointestinal: Nontender, no masses Musculoskeletal:  No clubbing/cyanosis of digits Neurological: No cranial nerve deficit on limited exam Psychiatric: Normal judgment/insight Normal mood and affect Results for orders placed or performed during the hospital encounter of 09/26/21 (from the past 24 hour(s))  Type and screen [place]     Status: None (Preliminary result)   Collection Time: 09/26/21  4:12 AM  Result Value  Ref Range   ABO/RH(D) A POS    Antibody Screen POS    Sample Expiration      09/29/2021,2359 Performed at [facility], [address] [place], [place] [address]    Antibody Identification PENDING   CBC with Differential/Platelet     Status: Abnormal   Collection Time: 09/26/21  4:12 AM  Result Value Ref Range   WBC 15.2 (H) 4.0 - 10.5 K/uL   RBC 3.93 3.87 - 5.11 MIL/uL   Hemoglobin 10.8 (L) 12.0 - 15.0 g/dL   HCT 31.0 (L) 36.0 - 46.0 %   MCV 78.9 (L) 80.0 - 100.0 fL   MCH 27.5 26.0 - 34.0 pg   MCHC 34.8 30.0 - 36.0 g/dL   RDW 23.7 (H) 11.5 - 15.5 %   Platelets 364 150 - 400 K/uL   nRBC 0.0 0.0 - 0.2 %   Neutrophils Relative % 90 %   Neutro Abs 13.6 (H) 1.7 - 7.7 K/uL   Lymphocytes Relative 5 %   Lymphs Abs 0.7 0.7 - 4.0 K/uL   Monocytes Relative 4 %   Monocytes Absolute 0.6 0.1 - 1.0 K/uL   Eosinophils Relative 0 %   Eosinophils Absolute 0.0 0.0 - 0.5 K/uL   Basophils Relative 0 %   Basophils Absolute 0.0 0.0 - 0.1 K/uL   Immature Granulocytes 1 %   Abs Immature Granulocytes 0.22 (H) 0.00 - 0.07 K/uL   Target Cells PRESENT    Stomatocytes PRESENT   Basic metabolic panel     Status: Abnormal   Collection Time: 09/26/21  4:12  AM  Result Value Ref Range   Sodium 138 135 - 145 mmol/L   Potassium 4.1 3.5 - 5.1 mmol/L   Chloride 108 98 - 111 mmol/L   CO2 23 22 - 32 mmol/L   Glucose, Bld 178 (H) 70 - 99 mg/dL   BUN 23 8 - 23 mg/dL   Creatinine, Ser 0.96 0.44 - 1.00 mg/dL   Calcium 8.9 8.9 - 10.3 mg/dL   GFR, Estimated >60 >60 mL/min   Anion gap 7 5 - 15  Protime-INR     Status: None   Collection Time: 09/26/21  4:12 AM  Result Value Ref Range   Prothrombin Time 14.9 11.4 - 15.2 seconds   INR 1.2 0.8 - 1.2  APTT     Status: None   Collection Time: 09/26/21  4:12 AM  Result Value Ref Range   aPTT 32 24 - 36 seconds

## 2021-09-26 NOTE — Anesthesia Procedure Notes (Signed)
Spinal  Patient location during procedure: OR Start time: 09/26/2021 2:06 PM End time: 09/26/2021 2:06 PM Reason for block: surgical anesthesia Staffing Performed: anesthesiologist  Anesthesiologist: Iran Ouch, MD Performed by: Iran Ouch, MD Authorized by: Iran Ouch, MD   Preanesthetic Checklist Completed: patient identified, IV checked, site marked, risks and benefits discussed, surgical consent, monitors and equipment checked, pre-op evaluation and timeout performed Spinal Block Patient position: sitting Prep: DuraPrep Patient monitoring: heart rate, cardiac monitor, continuous pulse ox and blood pressure Approach: midline Location: L3-4 Injection technique: single-shot Needle Needle type: Pencan  Needle gauge: 24 G Needle length: 9 cm Assessment Sensory level: T10 Events: CSF return

## 2021-09-26 NOTE — Consult Note (Signed)
Patient with a displaced right subtrochanteric proximal femur fracture in this patient after an unwitnessed fall tonight.  Patient will be admitted to the hospitalist service for pre-op clearance.  Patient should remain NPO as she will undergo intramedullary fixation for this fracture later today.  Patient should not receive anticoagulation in preparation for surgery today.

## 2021-09-26 NOTE — Assessment & Plan Note (Signed)
Sliding scale insulin coverage 

## 2021-09-26 NOTE — Progress Notes (Signed)
Admission profile updated. ?

## 2021-09-26 NOTE — Progress Notes (Addendum)
Initial Nutrition Assessment  DOCUMENTATION CODES:   Not applicable  INTERVENTION:   Glucerna Shake po TID, each supplement provides 220 kcal and 10 grams of protein  MVI po daily   Recommend liberal diet   NUTRITION DIAGNOSIS:   Increased nutrient needs related to hip fracture as evidenced by estimated needs.  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  Unable to see pt today as pt went directly from the ED into surgery. Suspect pt with poor appetite and oral intake at baseline. Pt is followed by the Dietitian at the [facility] and reports intermittent poor appetite and oral intake at baseline. Pt does drink two Glucerna supplements daily at home. Pt NPO today for hip fracture repair. RD will add supplements and MVI to help pt meet her estimated needs. Per chart, pt appears fairly weight stable pta. RD will follow up to obtain NFPE and history.   Medications reviewed and include: cefazolin   Labs reviewed: K 4.1 wnl Wbc- 15.2(H), Hgb 10.8(L), Hct 31.0(L)  NUTRITION - FOCUSED PHYSICAL EXAM: Unable to perform at this time   Diet Order:   Diet Order             Diet NPO time specified  Diet effective now                  EDUCATION NEEDS:   Not appropriate for education at this time  Skin:   not assessed   Last BM:  pta  Height:   Ht Readings from Last 1 Encounters:  09/26/21 5' 3.5" (1.613 m)    Weight:   Wt Readings from Last 1 Encounters:  09/26/21 55.3 kg    Ideal Body Weight:  53.4 kg  BMI:  Body mass index is 21.27 kg/m.  Estimated Nutritional Needs:    Kcal:  1500-1700kcal/day  Protein:  75-85g/day  Fluid:  1.4-1.6L/day  Koleen Distance MS, RD, LDN Please refer to [facility] for RD and/or RD on-call/weekend/after hours pager

## 2021-09-26 NOTE — Anesthesia Preprocedure Evaluation (Addendum)
Anesthesia Evaluation  Patient identified by MRN, date of birth, ID band Patient awake    Reviewed: Allergy & Precautions, NPO status , Patient's Chart, lab work & pertinent test results, reviewed documented beta blocker date and time   Airway Mallampati: III  TM Distance: >3 FB Neck ROM: full    Dental  (+) Chipped   Pulmonary neg pulmonary ROS,    Pulmonary exam normal        Cardiovascular METS: 3 - Mets hypertension, Pt. on home beta blockers and Pt. on medications +CHF (HFpEF)  Normal cardiovascular exam  05/2020 Echo: EF 60-65%; b. 08/2020 cMRI ([facility]): EF 71%, no delayed hyperenhancement to suggest scar/infiltrative dzs. Nl RV fxn. Mild BAE. Triv MR, mild TR; c. 06/2021 Echo: EF 50-55%, no rwma, mild basal-septal LVH, GrII DD. Nl RV size/fxn. Mild MR. Ao sclerosis.   Neuro/Psych H/o subdural hematoma negative neurological ROS  negative psych ROS   GI/Hepatic GERD  Controlled,Hepatic cirrhosis   Endo/Other  diabetes, Type 2, Insulin Dependent  Renal/GU CRFRenal disease     Musculoskeletal  (+) Arthritis , Rheumatoid disorders,    Abdominal   Peds  Hematology MGUS (monoclonal gammopathy of unknown significance)   Anesthesia Other Findings Past Medical History: History of amyloidosis No date: (HFpEF) heart failure with preserved ejection fraction ([place])     Comment:  a. 05/2020 Echo: EF 60-65%; b. 08/2020 cMRI ([facility]): EF               71%, no delayed hyperenhancement to suggest               scar/infiltrative dzs. Nl RV fxn. Mild BAE. Triv MR, mild              TR; c. 06/2021 Echo: EF 50-55%, no rwma, mild basal-septal              LVH, GrII DD. Nl RV size/fxn. Mild MR. Ao sclerosis. No date: AL amyloidosis (HCC) No date: Anemia due to chronic kidney disease     Comment:  Anemia in chronic kidney disease No date: CKD (chronic kidney disease), stage III (HCC) No date: Diabetes mellitus without complication (HCC) No  date: History of cardiovascular stress test     Comment:  a. 03/2012 Stress Echo([facility]): nl study. No date: Hypercholesterolemia No date: Hypertension No date: MGUS (monoclonal gammopathy of unknown significance) No date: Osteoarthritis No date: Rheumatoid arthritis ([place])  Past Surgical History: No date: HERNIA REPAIR No date: PARATHYROIDECTOMY  BMI    Body Mass Index: 21.61 kg/m      Reproductive/Obstetrics negative OB ROS                          Anesthesia Physical Anesthesia Plan  ASA: 3  Anesthesia Plan: Spinal   Post-op Pain Management: Tylenol PO (pre-op)*   Induction: Intravenous  PONV Risk Score and Plan: 2 and Propofol infusion and Treatment may vary due to age or medical condition  Airway Management Planned: Natural Airway and Nasal Cannula  Additional Equipment:   Intra-op Plan:   Post-operative Plan:   Informed Consent: I have reviewed the patients History and Physical, chart, labs and discussed the procedure including the risks, benefits and alternatives for the proposed anesthesia with the patient or authorized representative who has indicated his/her understanding and acceptance.     Dental Advisory Given  Plan Discussed with: Anesthesiologist, CRNA and Surgeon  Anesthesia Plan Comments:       Anesthesia Quick Evaluation

## 2021-09-26 NOTE — Op Note (Signed)
09/26/2021  5:04 PM  PATIENT:  Nancy Rodriguez    PRE-OPERATIVE DIAGNOSIS:  Right displaced reverse obliquity subtrochanteric proximal femur fracture  POST-OPERATIVE DIAGNOSIS:  Same  PROCEDURE:  INTRAMEDULLARY (IM) NAIL INTERTROCHANTRIC  SURGEON:  Thornton Park, MD  ANESTHESIA:   Spinal  EBL:  150 cc  IMPLANT:  ZIMMER BIOMET AFFIXUS NAIL 32m x 3643mwith a 9543mag screw and distal interlocking screw 109m1m length.  PREOPERATIVE INDICATIONS:  Nancy Rodriguez  76 y55. female with a diagnosis of right displaced reverse obliquity subtrochanteric proximal femur fracture.  Given the displacement of fracture and the fact that the patient was ambulatory prior to surgery, she was recommended for intramedullary fixation of her fracture.  The risks, benefits and alternatives were discussed with the patient and her daughter.  The risks include but are not limited to infection, bleeding requiring blood transfusion, nerve or blood vessel injury, malunion, nonunion, hardware prominence, hardware failure, leg length discrepancy or change in lower extremity rotation and need for further surgery including hardware removal with conversion to a total hip arthroplasty. Medical risks include but are not limited to DVT and pulmonary embolism, myocardial infarction, stroke, pneumonia, respiratory failure and death. The patient and their daughter understood these risks and wished to proceed with surgery.  OPERATIVE PROCEDURE:  The patient was brought to the operating room and placed in the supine position on the fracture table. The patient received spinal anesthesia.  A closed reduction was performed under C-arm guidance.  The fracture reduction was confirmed on both AP and lateral views. After adequate reduction was achieved, a time out was performed to verify the patient's name, date of birth, medical record number, correct site of surgery correct procedure to be performed. The timeout was also used  to verify the patient received antibiotics and all appropriate instruments, implants and radiographic studies were available in the room. Once all in attendance were in agreement, the case began. The patient was prepped and draped in a sterile fashion.  The patient received preoperative antibiotics with 2 g of Ancef IV.  The fracture and its alignment improved with longitudinal traction and neutral rotation.  An incision was made over the lateral thigh at the location of the fracture.  A bone hook was used to reduce the medially displaced shaft of the femur.  Once the shaft of the femur and the proximal femur were well aligned a clamp was applied to hold reduction.  An incision was then made proximal to the greater trochanter in line with the femur. A guidewire was placed over the tip of the greater trochanter and advanced by drill into the proximal femur to the level of the lesser trochanter.  Confirmation of the drill pin position was made on AP and lateral C-arm images.  The threaded guidepin was then overdrilled with the proximal femoral entry reamer.  A ball-tipped guidewire was then advanced down the intramedullary canal, across the fracture, and down the femoral shaft to the knee.  The ball tip guidewire's position was confirmed at both the knee and hip via C-arm imaging. A depth gauge was used to measure the length of the long nail to be used. It was measured to be 360 mm. The actual nail was then inserted into the proximal femur, across the fracture site and down the femoral shaft. Its position was confirmed on AP and lateral C-arm images.  The ball tip guidewire was removed.  Once the nail was completely seated, the drill guide for  the lag screw was placed through the guide arm for the Affixus nail. A guidepin was then placed through this drill guide and advanced through the lateral cortex of the femur, across the fracture site and into the femoral head achieving a tip apex distance of less than 25  mm. The length of the drill pin was measured to be 90 mm, and then the drill for the lag screw was advanced through the lateral cortex, across the fracture site and up into the femoral head to the depth of the lag screw. The lag screw was then advanced by hand into position across the fracture site into the femoral head. Its final position was confirmed on AP and lateral C-arm images. The set screw in the top of the intramedullary rod was tightened by hand using a screwdriver.   The attention was then turned to placement of a distal interlocking screws. A perfect circle technique was used. 2 small stab incisions were made over the distal interlocking screw holes.  A free hand technique was used to drill the distal interlocking screw for the dynamic hole first. The depth of the screw hole was measured with a depth gauge. A 6m screw was then advanced into position and tightened by hand.  An attempt was made to drill the second static locking hole unsuccessfully as the drill bit kept skiving off the femoral cortex anteriorly.  The decision was made not to place a second screw.  Final C-arm images of the entire intramedullary construct were taken in both the AP and lateral planes.   The wounds were irrigated copiously and closed with 0 Vicryl for closure of the deep fascia and 2-0 Vicryl for subcutaneous closure. The skin was approximated with staples. A dry sterile dressing was applied. I was scrubbed and present the entire case and all sharp, sponge and instrument counts were correct at the conclusion of the case. Patient was transferred to a hospital bed and brought to PACU in stable condition.  I spoke with the patient's daughter postoperatively to let her know the case was performed successfully and that her mother was in stable condition in the recovery room.    KTimoteo Gaul MD

## 2021-09-26 NOTE — ED Triage Notes (Addendum)
Pt arrived via [facility] from home where she lives with daughter who called post unwitnessed fall. Per EMS, pt non-compliant with walker leading to hx/o frequent falls. Last fall x7 days ago with same c/o right leg pain. Tonight unknown LOC and unknown head impact. Hx/o dementia and HTN.

## 2021-09-26 NOTE — Assessment & Plan Note (Signed)
Patient likely with severe protein calorie malnutrition Nutritionist evaluation

## 2021-09-26 NOTE — Assessment & Plan Note (Addendum)
Creatinine 1.12 on 6/5.  Hemoglobin on 6/14 at 1.27, likely some acute kidney injury secondary to blood loss anemia and has improved each time with blood

## 2021-09-26 NOTE — Assessment & Plan Note (Signed)
Based on age, frequent falls, chronic medical conditions and malnutrition

## 2021-09-26 NOTE — ED Provider Notes (Addendum)
[facility] Provider Note    Event Date/Time   First MD Initiated Contact with Patient 09/26/21 [phone]     (approximate)   History   Fall   HPI  Nancy Rodriguez is a 76 y.o. female who presents to the ED for evaluation of Fall   I reviewed PCP visit from a 5/9.  History of amyloidosis, DM, HTN.  Treating with daratumumab, Revlimid and dexamethasone.  Patient presents to the ED from home by EMS after a fall causing right hip injury.  She reports walking to the bedroom, witnessed by daughter, when she had a mechanical fall and struck the back of her head and her hip.  No syncope or seizure.  Daughter was unable to get her up so called 911.  Patient reports continued right hip pain.  Physical Exam   Triage Vital Signs: ED Triage Vitals  Enc Vitals Group     BP 09/26/21 0146 (!) 191/81     Pulse Rate 09/26/21 0146 94     Resp 09/26/21 0146 18     Temp 09/26/21 0146 98 F (36.7 C)     Temp Source 09/26/21 0146 Oral     SpO2 09/26/21 0146 95 %     Weight 09/26/21 0147 122 lb (55.3 kg)     Height 09/26/21 0147 '5\' 3"'$  (1.6 m)     Head Circumference --      Peak Flow --      Pain Score --      Pain Loc --      Pain Edu? --      Excl. in [place]? --     Most recent vital signs: Vitals:   09/26/21 0315 09/26/21 0330  BP: (!) 182/77 (!) 180/82  Pulse: 93 91  Resp: 19 (!) 21  Temp:    SpO2: 98% 98%    General: Awake, no distress.  Chronically ill-appearing. CV:  Good peripheral perfusion.  Resp:  Normal effort.  Abd:  No distention.  MSK:  Tenderness to the right hip.  Right leg is distally neurovascularly intact. Palpation of all 4 extremities otherwise without evidence of deformity, tenderness or trauma. Neuro:  No focal deficits appreciated. Other:     ED Results / Procedures / Treatments   Labs (all labs ordered are listed, but only abnormal results are displayed) Labs Reviewed  PROTIME-INR  APTT  CBC WITH DIFFERENTIAL/PLATELET  BASIC  METABOLIC PANEL  TYPE AND SCREEN    EKG   RADIOLOGY CT head interpreted by me without evidence of acute intracranial pathology CT cervical spine interpreted by me without evidence of fracture or dislocation CXR interpreted by me without evidence of acute cardiopulmonary pathology. Plain film of the pelvis and right hip interpreted by me with proximal femoral shaft fracture that is displaced.  Official radiology report(s): DG Chest Portable 1 View  Result Date: 09/26/2021 CLINICAL DATA:  Fall, preop EXAM: PORTABLE CHEST 1 VIEW COMPARISON:  None Available. FINDINGS: Elevation of the right hemidiaphragm. Heart and mediastinal contours are within normal limits. No focal opacities or effusions. No acute bony abnormality. IMPRESSION: No active disease. Electronically Signed   By: Rolm Baptise M.D.   On: 09/26/2021 03:11   DG Hip Unilat W or Wo Pelvis 2-3 Views Right  Result Date: 09/26/2021 CLINICAL DATA:  Fall EXAM: DG HIP (WITH OR WITHOUT PELVIS) 2-3V RIGHT COMPARISON:  None Available. FINDINGS: There is a fracture through the proximal shaft of the right femur. Femoral shaft is displaced  medially relative to the femoral neck. No subluxation or dislocation at the hip. IMPRESSION: Displaced right proximal femoral shaft fracture. Electronically Signed   By: Rolm Baptise M.D.   On: 09/26/2021 03:07   CT Cervical Spine Wo Contrast  Result Date: 09/26/2021 CLINICAL DATA:  Facial trauma, blunt.  Unwitnessed fall. EXAM: CT CERVICAL SPINE WITHOUT CONTRAST TECHNIQUE: Multidetector CT imaging of the cervical spine was performed without intravenous contrast. Multiplanar CT image reconstructions were also generated. RADIATION DOSE REDUCTION: This exam was performed according to the departmental dose-optimization program which includes automated exposure control, adjustment of the mA and/or kV according to patient size and/or use of iterative reconstruction technique. COMPARISON:  None Available. FINDINGS:  Alignment: No subluxation Skull base and vertebrae: No acute fracture. No primary bone lesion or focal pathologic process. Soft tissues and spinal canal: No prevertebral fluid or swelling. No visible canal hematoma. Disc levels:  Diffuse degenerative disc disease and facet disease. Upper chest: No acute findings Other: None IMPRESSION: No acute bony abnormality. Electronically Signed   By: Rolm Baptise M.D.   On: 09/26/2021 02:46   CT Head Wo Contrast  Result Date: 09/26/2021 CLINICAL DATA:  Head trauma, minor (Age >= 65y).  Unwitnessed fall EXAM: CT HEAD WITHOUT CONTRAST TECHNIQUE: Contiguous axial images were obtained from the base of the skull through the vertex without intravenous contrast. RADIATION DOSE REDUCTION: This exam was performed according to the departmental dose-optimization program which includes automated exposure control, adjustment of the mA and/or kV according to patient size and/or use of iterative reconstruction technique. 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 No new hemorrhage, hydrocephalus or acute infarction. No mass effect or midline shift. Mild atrophy. Vascular: No hyperdense vessel or unexpected calcification. Skull: No acute calvarial abnormality. Sinuses/Orbits: No acute findings Other: None IMPRESSION: Small bilateral chronic subdural hematomas, stable or slightly smaller since prior study. No acute intracranial abnormality. Electronically Signed   By: Rolm Baptise M.D.   On: 09/26/2021 02:45    PROCEDURES and INTERVENTIONS:  Ultrasound ED Peripheral IV (Provider)  Date/Time: 09/26/2021 5:23 AM  Performed by: Vladimir Crofts, MD Authorized by: Vladimir Crofts, MD   Procedure details:    Indications: multiple failed IV attempts and poor IV access     Skin Prep: chlorhexidine gluconate     Location: right cephalic v.   Angiocath:  20  G   Bedside Ultrasound Guided: Yes     Images: not archived     Patient tolerated procedure without complications: Yes     Dressing applied: Yes     Medications  HYDROmorphone (DILAUDID) injection 0.5 mg (has no administration in time range)  ceFAZolin (ANCEF) IVPB 2g/100 mL premix (has no administration in time range)     IMPRESSION / MDM / ASSESSMENT AND PLAN / ED COURSE  I reviewed the triage vital signs and the nursing notes.  Differential diagnosis includes, but is not limited to, hip fracture, pelvic fracture, intracranial hemorrhage, syncope  {Patient presents with symptoms of an acute illness or injury that is potentially life-threatening.  76 year old female presents to the ED after mechanical fall with evidence of a right femur fracture requiring medical admission for orthopedic fixation.  No evidence of neurologic or vascular deficits, no evidence of open injury or fracture.  Blood work pending at the time of admission.  Clinical Course as of 09/26/21 0428  Tue Sep 26, 2021  0356 I  consult with Dr. Mack Guise.  Requests medical admission and will hopefully repair today.  Keep NPO. [DS]  0405 I consult with medicine who agrees to admit. 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   Vladimir Crofts, MD 09/26/21 [place], [place], MD 09/26/21 [phone]

## 2021-09-26 NOTE — Assessment & Plan Note (Addendum)
Preoperative clearance is pending Blood work including CMP and CBC pending at the time of admission Chest x-ray with no active disease.  EKG pending Patient had a cardiac work-up that included cardiac MRI and echocardiogram in May 2022 that showed no evidence of amyloid.  EF was 71% Patient ambulates at baseline but with high fall risk so will benefit to early repair. Low to low moderate risk of cardiopulmonary events Overall poor prognostic outlook based on poor nutritional status and chronic medical conditions

## 2021-09-26 NOTE — Assessment & Plan Note (Addendum)
On chemotherapy revlimid, dexamethasone and daratumumab.  Last seen by Dr. Yu on 6/5 Most recent labs from 6/5 showing AST 47, ALT 30, alk phos 898 with total bilirubin of 4.7.  Skilled nursing has informed patient's family to bring her oral chemotherapy pills as they will not be able to supply them during her stay. 

## 2021-09-26 NOTE — Assessment & Plan Note (Addendum)
Status post IM nail by orthopedic surgery on 6/13.  For skilled nursing.  As needed Ultram for moderate pain and as needed Vicodin for severe pain.  Follow-up with orthopedic surgery in 1 week.  Lovenox 40 mg daily until stopped by orthopedic surgery and office for follow-up

## 2021-09-26 NOTE — Anesthesia Procedure Notes (Signed)
Date/Time: 09/26/2021 1:46 PM  Performed by: Johnna Acosta, CRNAPre-anesthesia Checklist: Patient identified, Emergency Drugs available, Suction available, Patient being monitored and Timeout performed Patient Re-evaluated:Patient Re-evaluated prior to induction Oxygen Delivery Method: Nasal cannula Preoxygenation: Pre-oxygenation with 100% oxygen Induction Type: IV induction

## 2021-09-26 NOTE — Assessment & Plan Note (Addendum)
BP 180/82.  Blood pressure much improved and actually soft at times since then, likely from blood loss.  Blood pressure with a systolic in the 383F by time of discharge.

## 2021-09-26 NOTE — Progress Notes (Signed)
Subjective:  POST OP CHECK s/p intramedullary fixation for right reverse obliquity subtrochanteric femur fracture.   Patient reports right hip pain as mild.  Patient is seen with her daughter at the bedside and her RN in the room.  Patient is wrapped up in blankets and states that she is cold.  Objective:   VITALS:   Vitals:   09/26/21 1700 09/26/21 1715 09/26/21 1730 09/26/21 1749  BP: (!) 111/58 (!) 98/57 124/64 129/62  Pulse: 85 88 87 90  Resp: '18 20 19   '$ Temp:   97.8 F (36.6 C) (!) 97.1 F (36.2 C)  TempSrc:      SpO2: 97% 98% 98% 99%  Weight:      Height:        PHYSICAL EXAM: Right lower extremity: Neurovascular intact Sensation intact distally Intact pulses distally Dorsiflexion/Plantar flexion intact Incision: scant drainage No cellulitis present Compartment soft  LABS  Results for orders placed or performed during the hospital encounter of 09/26/21 (from the past 24 hour(s))  Type and screen [place]     Status: None (Preliminary result)   Collection Time: 09/26/21  4:12 AM  Result Value Ref Range   ABO/RH(D) A POS    Antibody Screen POS    Sample Expiration 09/29/2021,2359    Antibody Identification      NO CLINICALLY SIGNIFICANT ANTIBODY IDENTIFIED Performed at [facility], [address] [place], [place] [address]    Unit Number [id number]    Blood Component Type RBC, LR IRR    Unit division 00    Status of Unit ALLOCATED    Transfusion Status OK TO TRANSFUSE    Crossmatch Result COMPATIBLE    Unit Number [id number]    Blood Component Type RBC, LR IRR    Unit division 00    Status of Unit ALLOCATED    Transfusion Status OK TO TRANSFUSE    Crossmatch Result COMPATIBLE   CBC with Differential/Platelet     Status: Abnormal   Collection Time: 09/26/21  4:12 AM  Result Value Ref Range   WBC 15.2 (H) 4.0 - 10.5 K/uL   RBC 3.93 3.87 - 5.11 MIL/uL   Hemoglobin 10.8 (L) 12.0 - 15.0 g/dL   HCT 31.0 (L) 36.0 -  46.0 %   MCV 78.9 (L) 80.0 - 100.0 fL   MCH 27.5 26.0 - 34.0 pg   MCHC 34.8 30.0 - 36.0 g/dL   RDW 23.7 (H) 11.5 - 15.5 %   Platelets 364 150 - 400 K/uL   nRBC 0.0 0.0 - 0.2 %   Neutrophils Relative % 90 %   Neutro Abs 13.6 (H) 1.7 - 7.7 K/uL   Lymphocytes Relative 5 %   Lymphs Abs 0.7 0.7 - 4.0 K/uL   Monocytes Relative 4 %   Monocytes Absolute 0.6 0.1 - 1.0 K/uL   Eosinophils Relative 0 %   Eosinophils Absolute 0.0 0.0 - 0.5 K/uL   Basophils Relative 0 %   Basophils Absolute 0.0 0.0 - 0.1 K/uL   Immature Granulocytes 1 %   Abs Immature Granulocytes 0.22 (H) 0.00 - 0.07 K/uL   Target Cells PRESENT    Stomatocytes PRESENT   Basic metabolic panel     Status: Abnormal   Collection Time: 09/26/21  4:12 AM  Result Value Ref Range   Sodium 138 135 - 145 mmol/L   Potassium 4.1 3.5 - 5.1 mmol/L   Chloride 108 98 - 111 mmol/L   CO2 23 22 -  32 mmol/L   Glucose, Bld 178 (H) 70 - 99 mg/dL   BUN 23 8 - 23 mg/dL   Creatinine, Ser 0.96 0.44 - 1.00 mg/dL   Calcium 8.9 8.9 - 10.3 mg/dL   GFR, Estimated >60 >60 mL/min   Anion gap 7 5 - 15  Protime-INR     Status: None   Collection Time: 09/26/21  4:12 AM  Result Value Ref Range   Prothrombin Time 14.9 11.4 - 15.2 seconds   INR 1.2 0.8 - 1.2  APTT     Status: None   Collection Time: 09/26/21  4:12 AM  Result Value Ref Range   aPTT 32 24 - 36 seconds  CBG monitoring, ED     Status: Abnormal   Collection Time: 09/26/21 12:31 PM  Result Value Ref Range   Glucose-Capillary 194 (H) 70 - 99 mg/dL  CBG monitoring, ED     Status: Abnormal   Collection Time: 09/26/21  4:57 PM  Result Value Ref Range   Glucose-Capillary 179 (H) 70 - 99 mg/dL    DG FEMUR, MIN 2 VIEWS RIGHT  Result Date: 09/26/2021 CLINICAL DATA:  Status post right IM nail fixation EXAM: RIGHT FEMUR 2 VIEWS; PORTABLE PELVIS 1-2 VIEWS COMPARISON:  09/26/2021 intraoperative images and hip radiographs FINDINGS: Status post intramedullary nail fixation of a previously noted  proximal right femoral diaphyseal fracture with improved alignment of the fracture components. No perihardware lucency or additional acute fracture in the pelvis or right femur. Air within the soft tissues, not unexpected post surgically. Superficial skin staples. IMPRESSION: Expected postoperative appearance, status post intramedullary nail fixation of a right femoral fracture. Electronically Signed   By: Merilyn Baba M.D.   On: 09/26/2021 17:38   Pelvis Portable  Result Date: 09/26/2021 CLINICAL DATA:  Status post right IM nail fixation EXAM: RIGHT FEMUR 2 VIEWS; PORTABLE PELVIS 1-2 VIEWS COMPARISON:  09/26/2021 intraoperative images and hip radiographs FINDINGS: Status post intramedullary nail fixation of a previously noted proximal right femoral diaphyseal fracture with improved alignment of the fracture components. No perihardware lucency or additional acute fracture in the pelvis or right femur. Air within the soft tissues, not unexpected post surgically. Superficial skin staples. IMPRESSION: Expected postoperative appearance, status post intramedullary nail fixation of a right femoral fracture. Electronically Signed   By: Merilyn Baba M.D.   On: 09/26/2021 17:38   DG FEMUR, MIN 2 VIEWS RIGHT  Result Date: 09/26/2021 CLINICAL DATA:  Right proximal femoral ORIF EXAM: RIGHT FEMUR 2 VIEWS COMPARISON:  Pelvis and right hip radiographs 09/26/2021 FINDINGS: Images were performed intraoperatively without the presence of a radiologist. Redemonstration of oblique proximal right femoral diaphyseal fracture. New long-stem cephalo medullary nail fixation of this fracture with improved now anatomic alignment. No complication visualized. Total fluoroscopy images: 6 Total fluoroscopy time: 183 seconds Please see intraoperative findings for further detail. IMPRESSION: Intraoperative fluoroscopy for proximal right femoral ORIF. Electronically Signed   By: Yvonne Kendall M.D.   On: 09/26/2021 17:04   DG C-Arm 1-60  Min-No Report  Result Date: 09/26/2021 Fluoroscopy was utilized by the requesting physician.  No radiographic interpretation.   DG C-Arm 1-60 Min-No Report  Result Date: 09/26/2021 Fluoroscopy was utilized by the requesting physician.  No radiographic interpretation.   DG C-Arm 1-60 Min-No Report  Result Date: 09/26/2021 Fluoroscopy was utilized by the requesting physician.  No radiographic interpretation.   DG Chest Portable 1 View  Result Date: 09/26/2021 CLINICAL DATA:  Fall, preop EXAM: PORTABLE CHEST 1 VIEW COMPARISON:  None Available. FINDINGS: Elevation of the right hemidiaphragm. Heart and mediastinal contours are within normal limits. No focal opacities or effusions. No acute bony abnormality. IMPRESSION: No active disease. Electronically Signed   By: Rolm Baptise M.D.   On: 09/26/2021 03:11   DG Hip Unilat W or Wo Pelvis 2-3 Views Right  Result Date: 09/26/2021 CLINICAL DATA:  Fall EXAM: DG HIP (WITH OR WITHOUT PELVIS) 2-3V RIGHT COMPARISON:  None Available. FINDINGS: There is a fracture through the proximal shaft of the right femur. Femoral shaft is displaced medially relative to the femoral neck. No subluxation or dislocation at the hip. IMPRESSION: Displaced right proximal femoral shaft fracture. Electronically Signed   By: Rolm Baptise M.D.   On: 09/26/2021 03:07   CT Cervical Spine Wo Contrast  Result Date: 09/26/2021 CLINICAL DATA:  Facial trauma, blunt.  Unwitnessed fall. EXAM: CT CERVICAL SPINE WITHOUT CONTRAST TECHNIQUE: Multidetector CT imaging of the cervical spine was performed without intravenous contrast. Multiplanar CT image reconstructions were also generated. RADIATION DOSE REDUCTION: This exam was performed according to the departmental dose-optimization program which includes automated exposure control, adjustment of the mA and/or kV according to patient size and/or use of iterative reconstruction technique. COMPARISON:  None Available. FINDINGS: Alignment: No  subluxation Skull base and vertebrae: No acute fracture. No primary bone lesion or focal pathologic process. Soft tissues and spinal canal: No prevertebral fluid or swelling. No visible canal hematoma. Disc levels:  Diffuse degenerative disc disease and facet disease. Upper chest: No acute findings Other: None IMPRESSION: No acute bony abnormality. Electronically Signed   By: Rolm Baptise M.D.   On: 09/26/2021 02:46   CT Head Wo Contrast  Result Date: 09/26/2021 CLINICAL DATA:  Head trauma, minor (Age >= 65y).  Unwitnessed fall EXAM: CT HEAD WITHOUT CONTRAST TECHNIQUE: Contiguous axial images were obtained from the base of the skull through the vertex without intravenous contrast. RADIATION DOSE REDUCTION: This exam was performed according to the departmental dose-optimization program which includes automated exposure control, adjustment of the mA and/or kV according to patient size and/or use of iterative reconstruction technique. 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 No new hemorrhage, hydrocephalus or acute infarction. No mass effect or midline shift. Mild atrophy. Vascular: No hyperdense vessel or unexpected calcification. Skull: No acute calvarial abnormality. Sinuses/Orbits: No acute findings Other: None IMPRESSION: Small bilateral chronic subdural hematomas, stable or slightly smaller since prior study. No acute intracranial abnormality. Electronically Signed   By: Rolm Baptise M.D.   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  Postoperative x-rays demonstrate the subtrochanteric femur fracture is well reduced.  The intramedullary hardware is well-positioned and there is no evidence of postop complication.  Patient will continue 24 hours of postop antibiotics.  She will begin Lovenox tomorrow for DVT prophylaxis.  Patient will have labs drawn in the morning and will begin physical therapy tomorrow.  Patient is weightbearing as tolerated on the right lower extremity.    Thornton Park , MD 09/26/2021, 6:50 PM

## 2021-09-26 NOTE — Transfer of Care (Addendum)
Immediate Anesthesia Transfer of Care Note  Patient: Nancy Rodriguez  Procedure(s) Performed: INTRAMEDULLARY (IM) NAIL INTERTROCHANTRIC (Right: Hip)  Patient Location: PACU  Anesthesia Type:Spinal  Level of Consciousness: awake  Airway & Oxygen Therapy: Patient Spontanous Breathing  Post-op Assessment: Report given to RN  Post vital signs: stable  Last Vitals:  Vitals Value Taken Time  BP    Temp    Pulse    Resp    SpO2      Last Pain:  Vitals:   09/26/21 1237  TempSrc: Temporal  PainSc: 6          Complications: No notable events documented.

## 2021-09-26 NOTE — Assessment & Plan Note (Deleted)
BP 180/82 Hydralazine IV as needed while n.p.o.

## 2021-09-27 ENCOUNTER — Encounter: Payer: Self-pay | Admitting: Orthopedic Surgery

## 2021-09-27 ENCOUNTER — Inpatient Hospital Stay: Payer: Medicare Other

## 2021-09-27 DIAGNOSIS — S72321A Displaced transverse fracture of shaft of right femur, initial encounter for closed fracture: Secondary | ICD-10-CM | POA: Diagnosis not present

## 2021-09-27 DIAGNOSIS — F05 Delirium due to known physiological condition: Secondary | ICD-10-CM | POA: Diagnosis not present

## 2021-09-27 DIAGNOSIS — D62 Acute posthemorrhagic anemia: Secondary | ICD-10-CM | POA: Diagnosis not present

## 2021-09-27 DIAGNOSIS — N1831 Chronic kidney disease, stage 3a: Secondary | ICD-10-CM | POA: Diagnosis not present

## 2021-09-27 LAB — CBC
HCT: 21.2 % — ABNORMAL LOW (ref 36.0–46.0)
Hemoglobin: 7.4 g/dL — ABNORMAL LOW (ref 12.0–15.0)
MCH: 26.9 pg (ref 26.0–34.0)
MCHC: 34.9 g/dL (ref 30.0–36.0)
MCV: 77.1 fL — ABNORMAL LOW (ref 80.0–100.0)
Platelets: 333 10*3/uL (ref 150–400)
RBC: 2.75 MIL/uL — ABNORMAL LOW (ref 3.87–5.11)
RDW: 23.3 % — ABNORMAL HIGH (ref 11.5–15.5)
WBC: 11.3 10*3/uL — ABNORMAL HIGH (ref 4.0–10.5)
nRBC: 0 % (ref 0.0–0.2)

## 2021-09-27 LAB — HEMOGLOBIN AND HEMATOCRIT, BLOOD
HCT: 25.5 % — ABNORMAL LOW (ref 36.0–46.0)
Hemoglobin: 8.8 g/dL — ABNORMAL LOW (ref 12.0–15.0)

## 2021-09-27 LAB — BASIC METABOLIC PANEL
Anion gap: 7 (ref 5–15)
BUN: 30 mg/dL — ABNORMAL HIGH (ref 8–23)
CO2: 23 mmol/L (ref 22–32)
Calcium: 8.1 mg/dL — ABNORMAL LOW (ref 8.9–10.3)
Chloride: 109 mmol/L (ref 98–111)
Creatinine, Ser: 1.27 mg/dL — ABNORMAL HIGH (ref 0.44–1.00)
GFR, Estimated: 44 mL/min — ABNORMAL LOW (ref >60)
Glucose, Bld: 196 mg/dL — ABNORMAL HIGH (ref 70–99)
Potassium: 4.4 mmol/L (ref 3.5–5.1)
Sodium: 139 mmol/L (ref 135–145)

## 2021-09-27 LAB — GLUCOSE, CAPILLARY
Glucose-Capillary: 188 mg/dL — ABNORMAL HIGH (ref 70–99)
Glucose-Capillary: 187 mg/dL — ABNORMAL HIGH (ref 70–99)
Glucose-Capillary: 224 mg/dL — ABNORMAL HIGH (ref 70–99)
Glucose-Capillary: 184 mg/dL — ABNORMAL HIGH (ref 70–99)
Glucose-Capillary: 161 mg/dL — ABNORMAL HIGH (ref 70–99)

## 2021-09-27 LAB — HEMOGLOBIN A1C
Hgb A1c MFr Bld: 6.4 % — ABNORMAL HIGH (ref 4.8–5.6)
Mean Plasma Glucose: 136.98 mg/dL

## 2021-09-27 LAB — PREPARE RBC (CROSSMATCH)

## 2021-09-27 IMAGING — DX DG WRIST 2V*L*
2 series · 2 of 2 positions shown · non-contrast
Comparison: None available

CLINICAL DATA: Left wrist pain after fall yesterday.

EXAM:
LEFT WRIST - 2 VIEW

[wrist ap]
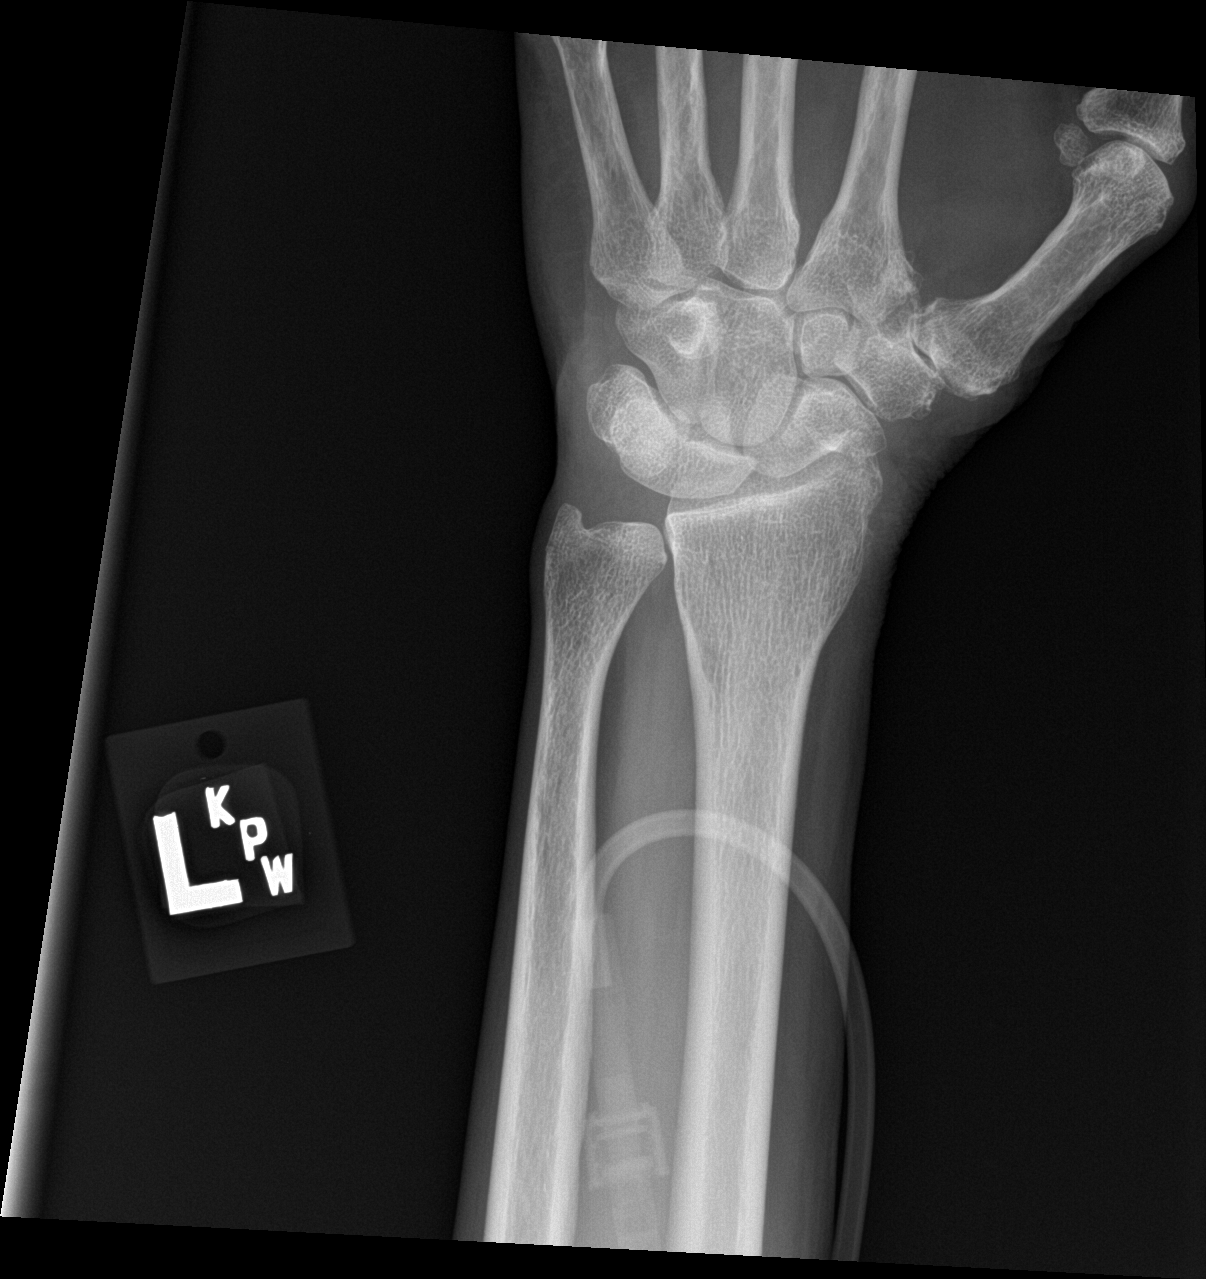

[wrist lat]
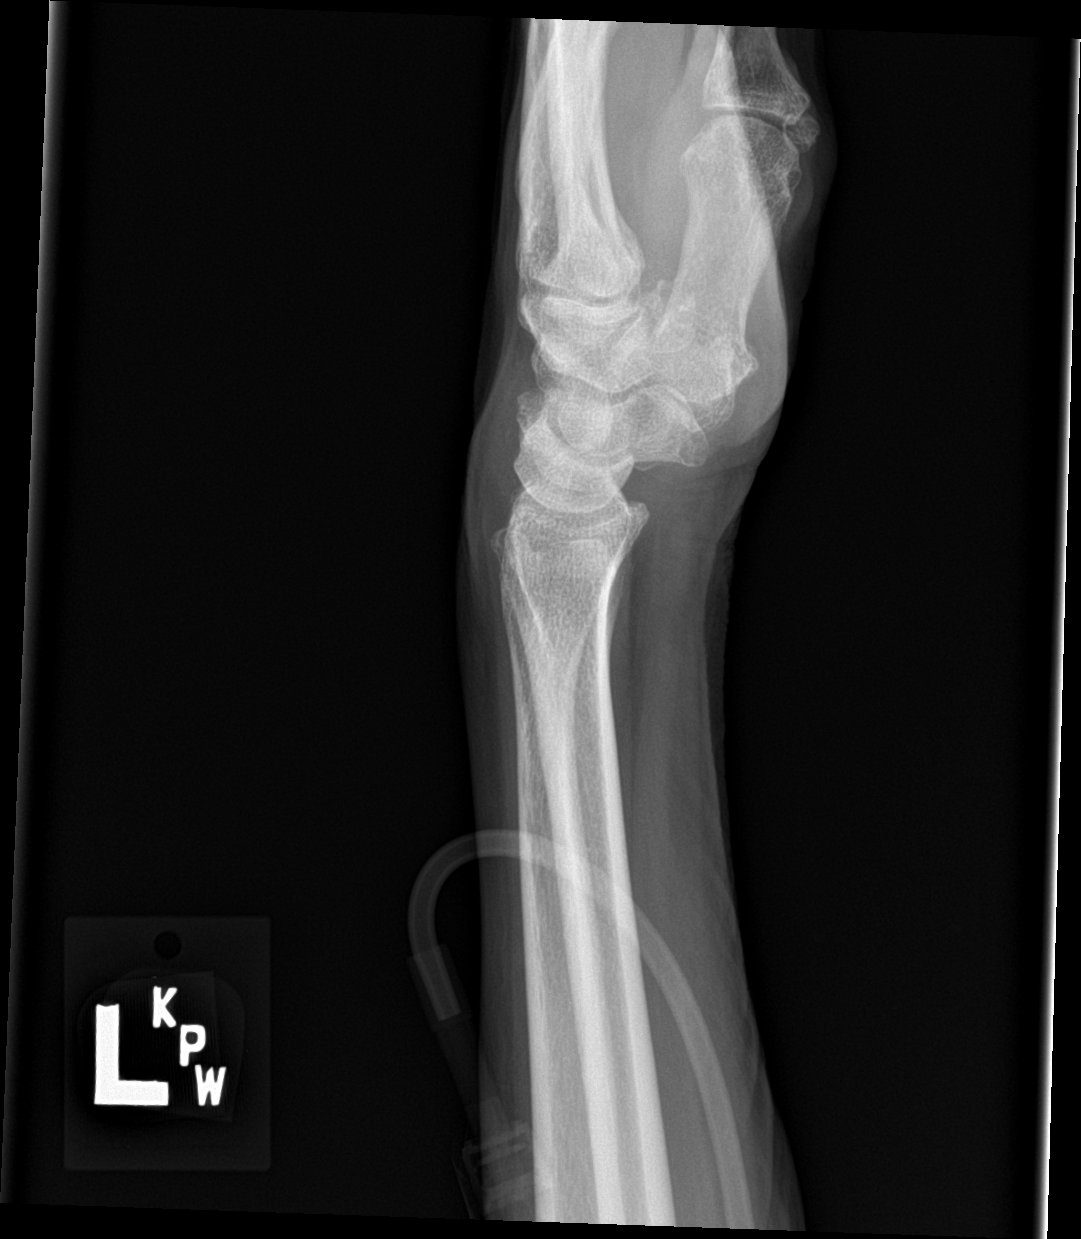

[2 of 2 positions shown; findings below may reference images not displayed]

FINDINGS: There is 1 mm ulnar negative variance. Moderate to severe thumb
carpometacarpal and moderate triscaphe joint space narrowing and
peripheral osteophytosis. Mild-to-moderate second carpometacarpal
joint space narrowing and peripheral osteophytosis. Moderate distal
radial styloid degenerative spur contacts a degenerative spur at the
distal lateral aspect of the scaphoid.

Bone overlap in different regions of the scaphoid on frontal and
lateral views limits evaluation for an acute fracture. This includes
within the region of the distal lateral aspect of the scaphoid where
a prominent degenerative spur overlies the distal radial styloid.
There is decreased scaphoid bone mineralization in this region with
multiple lucent likely bone nutrient foramina. No definite acute
fracture is visualized.
IMPRESSION: 1. Moderate to severe thumb carpometacarpal and moderate triscaphe
osteoarthritis.
2. Moderate degenerative changes of the distal radial styloid and
adjacent distal lateral aspect of the scaphoid.
3. No definite acute fracture is seen. If there is there is snuffbox
tenderness and clinical concern for a radiographically occult
fracture, consider splinting and follow-up radiographs in 10 days.

## 2021-09-27 MED ORDER — INSULIN ASPART 100 UNIT/ML IJ SOLN
0.0000 [IU] | Freq: Three times a day (TID) | INTRAMUSCULAR | Status: DC
  Administered 2021-09-27: 3 [IU] via SUBCUTANEOUS
  Administered 2021-09-27 – 2021-09-28 (×2): 5 [IU] via SUBCUTANEOUS
  Administered 2021-09-28: 3 [IU] via SUBCUTANEOUS
  Administered 2021-09-28: 2 [IU] via SUBCUTANEOUS
  Administered 2021-09-29: 5 [IU] via SUBCUTANEOUS
  Administered 2021-09-29: 3 [IU] via SUBCUTANEOUS
  Administered 2021-09-30: 2 [IU] via SUBCUTANEOUS
  Administered 2021-09-30: 3 [IU] via SUBCUTANEOUS
  Administered 2021-10-01: 2 [IU] via SUBCUTANEOUS
  Administered 2021-10-01: 5 [IU] via SUBCUTANEOUS
  Administered 2021-10-01 – 2021-10-02 (×2): 3 [IU] via SUBCUTANEOUS
  Administered 2021-10-02: 5 [IU] via SUBCUTANEOUS
  Administered 2021-10-02: 8 [IU] via SUBCUTANEOUS
  Administered 2021-10-03: 5 [IU] via SUBCUTANEOUS
  Administered 2021-10-03 (×2): 2 [IU] via SUBCUTANEOUS
  Filled 2021-09-27 (×17): qty 1

## 2021-09-27 MED ORDER — LENALIDOMIDE 10 MG PO CAPS
10.0000 mg | ORAL_CAPSULE | Freq: Every day | ORAL | Status: DC
  Administered 2021-09-27 – 2021-10-03 (×7): 10 mg via ORAL
  Filled 2021-09-27 (×8): qty 1

## 2021-09-27 MED ORDER — INSULIN ASPART 100 UNIT/ML IJ SOLN: 0.0000 [IU] | Freq: Every day | INTRAMUSCULAR | Status: DC

## 2021-09-27 MED ORDER — ACYCLOVIR 200 MG PO CAPS
800.0000 mg | ORAL_CAPSULE | Freq: Two times a day (BID) | ORAL | Status: DC
  Administered 2021-09-27 (×2): 800 mg via ORAL
  Filled 2021-09-27 (×2): qty 4

## 2021-09-27 MED ORDER — SODIUM CHLORIDE 0.9% IV SOLUTION: Freq: Once | INTRAVENOUS | Status: AC

## 2021-09-27 MED ORDER — ACETAMINOPHEN 325 MG PO TABS
650.0000 mg | ORAL_TABLET | Freq: Once | ORAL | Status: AC
  Administered 2021-09-27: 650 mg via ORAL
  Filled 2021-09-27: qty 2

## 2021-09-27 MED ORDER — SODIUM CHLORIDE 0.9% IV SOLUTION: Freq: Once | INTRAVENOUS | Status: DC

## 2021-09-27 NOTE — Assessment & Plan Note (Addendum)
Secondary to surgery.  Patient does not show signs of active bleeding.  Transfused 1 unit packed red blood cells 6/14.  With IV fluids, hemoglobin has slightly dropped further (likely from hemoconcentration and I expect that once she is fully hydrated, she will need another unit of packed red blood cells)

## 2021-09-27 NOTE — Assessment & Plan Note (Addendum)
Suspect secondary to anesthesia, medication for pain and sleep deprivation.  Patient does not have a previous history of dementia.  Remains overall alert and oriented.  Although gets confused at night

## 2021-09-27 NOTE — Evaluation (Signed)
Physical Therapy Evaluation Patient Details Name: Nancy Rodriguez MRN: [id number] DOB: Apr 24, 1945 Today's Date: 09/27/2021  History of Present Illness  Nancy Rodriguez is a 31yoF who [facility] s/p fall, subtrochanteric femur fracture seen, taken to OR for IM fixation c Dr. Mack Guise on 6/13. Pt inititally in ED on 6/6 after fall with subsequent thigh pain upon weightbearing, tripped over a stationary bike at home, also hit her head and had subsequent AMS. PMH: CHF,RA, amyloidosis, , HTN, DM2, CKD3a. protein-calorie malnutrition. Postoperatively WBAT on Rt. Pt uses either a SPC or RW at baseline. Postop Hb down from 10.8 to 7.4, received some blood. Per DTR pt was AMB ad lib quite well, taking part in cardiac rehab, baseline mentation WNL.  Clinical Impression  Pt admitted with above diagnosis. Pt currently with functional limitations due to the deficits listed below (see "PT Problem List"). Upon entry, pt in recliner, awake and agreeable to participate. DTR is as bedside, provides extensive detail on homesetup and PLOF. The pt is alert, pleasant, interactive, and calm, but very much so altered from her baseline mentation, cannot provide veyr detailed responses to questioning due to tangential thought process. Pt is able to follow extensive repeated cues for AA/ROM but cannot attend to counting reps for more than 2 before trailing off. Pt requires maxA of RLE for AA/ROM, tactile cues on Left. Pt requires totalA +2 to scoot up in recliner.  Patient's performance this date reveals decreased ability, independence, and tolerance in performing all basic mobility required for performance of activities of daily living. Pt requires additional DME, close physical assistance, and cues for safe participate in mobility. Pt will benefit from skilled PT intervention to increase independence and safety with basic mobility in preparation for discharge to the venue listed below.              Recommendations for follow up therapy are one component of a multi-disciplinary discharge planning process, led by the attending physician.  Recommendations may be updated based on patient status, additional functional criteria and insurance authorization.  Follow Up Recommendations Skilled nursing-short term rehab (<3 hours/day)    Assistance Recommended at Discharge Frequent or constant Supervision/Assistance  Patient can return home with the following  Two people to help with walking and/or transfers;Direct supervision/assist for medications management;Two people to help with bathing/dressing/bathroom;Direct supervision/assist for financial management;Assist for transportation;Assistance with cooking/housework;Assistance with feeding;Help with stairs or ramp for entrance    Equipment Recommendations  (will defer to faclity at DC)  Recommendations for Other Services       Functional Status Assessment Patient has had a recent decline in their functional status and demonstrates the ability to make significant improvements in function in a reasonable and predictable amount of time.     Precautions / Restrictions Precautions Precautions: Fall Restrictions Weight Bearing Restrictions: Yes RLE Weight Bearing: Weight bearing as tolerated      Mobility  Bed Mobility               General bed mobility comments: in recliner at start adn finish; needs totalA + to scoot up in recliner    Transfers Overall transfer level:  (deferred due to severe pain, weakness, cognition. Will plan on stayingin chair a bit longer.)                      Ambulation/Gait                  Stairs  Wheelchair Mobility    Modified Rankin (Stroke Patients Only)       Balance                                             Pertinent Vitals/Pain Pain Assessment Pain Assessment: PAINAD Pain Score:  (no pain at rest, but has severe ROM  limitations and minimal A/ROM) Breathing: normal Negative Vocalization: none Facial Expression: sad, frightened, frown Body Language: tense, distressed pacing, fidgeting Consolability: distracted or reassured by voice/touch PAINAD Score: 3 Pain Location: RLE    Home Living Family/patient expects to be discharged to:: Private residence Living Arrangements: Children Available Help at Discharge: Family;Available 24 hours/day Type of Home: House Home Access: Stairs to enter Entrance Stairs-Rails: Right;Left;Can reach both Entrance Stairs-Number of Steps: 3   Home Layout: One level Home Equipment: Conservation officer, nature (2 wheels);Cane - quad;Shower seat;Grab bars - tub/shower;BSC/3in1      Prior Function Prior Level of Function : Needs assist;History of Falls (last six months)             Mobility Comments: Until recently has been ambulating with quad cane; recently changed to RW for increased stability. ADLs Comments: Daughter provides Min A for bathing and assists with all IADL. Pt reports ~ 7 falls in previous 6 months.     Hand Dominance        Extremity/Trunk Assessment   Upper Extremity Assessment Upper Extremity Assessment: Generalized weakness    Lower Extremity Assessment Lower Extremity Assessment: Generalized weakness;RLE deficits/detail RLE Deficits / Details: s/p intramedullary fixation of femoral fx RLE: Unable to fully assess due to pain RLE Sensation: WNL       Communication   Communication: No difficulties  Cognition Arousal/Alertness: Awake/alert   Overall Cognitive Status: Impaired/Different from baseline                                 General Comments: very confused, tangential in conversation, cannot provide detailed FU to questions.        General Comments      Exercises General Exercises - Lower Extremity Ankle Circles/Pumps: AROM, Both, 5 reps, Supine, Limitations Ankle Circles/Pumps Limitations: poor mentation, not  attending to task, able to perform with repeated multimodal cues Short Arc Quad: AAROM, Both, 5 reps, Supine, Limitations Short Arc Quad Limitations: poor mentation, not attending to task, able to perform with repeated multimodal cues Heel Slides: Both, 5 reps, AAROM, Supine, Limitations Heel Slides Limitations: poor mentation, not attending to task, able to perform with repeated multimodal cues Hip ABduction/ADduction: AAROM, Limitations, Both, 5 reps, Supine Hip Abduction/Adduction Limitations: poor mentation, not attending to task, able to perform with repeated multimodal cues   Assessment/Plan    PT Assessment Patient needs continued PT services  PT Problem List Decreased strength;Decreased range of motion;Decreased activity tolerance;Decreased balance;Decreased mobility;Decreased coordination;Decreased cognition;Decreased safety awareness;Decreased knowledge of use of DME;Decreased knowledge of precautions       PT Treatment Interventions DME instruction;Balance training;Gait training;Stair training;Functional mobility training;Therapeutic activities;Therapeutic exercise;Patient/family education;Neuromuscular re-education    PT Goals (Current goals can be found in the Care Plan section)  Acute Rehab PT Goals Patient Stated Goal: regain baseline mobility/mentation PT Goal Formulation: With family Time For Goal Achievement: 10/11/21 Potential to Achieve Goals: Good    Frequency 7X/week (BID if appropriate 2/2 AMS)  Co-evaluation               AM-PAC PT "6 Clicks" Mobility  Outcome Measure Help needed turning from your back to your side while in a flat bed without using bedrails?: Total Help needed moving from lying on your back to sitting on the side of a flat bed without using bedrails?: Total Help needed moving to and from a bed to a chair (including a wheelchair)?: Total Help needed standing up from a chair using your arms (e.g., wheelchair or bedside chair)?:  Total Help needed to walk in hospital room?: Total Help needed climbing 3-5 steps with a railing? : Total 6 Click Score: 6    End of Session   Activity Tolerance: Patient limited by pain;Patient limited by fatigue Patient left: in chair;with family/visitor present;with call bell/phone within reach;with chair alarm set Nurse Communication: Mobility status PT Visit Diagnosis: Difficulty in walking, not elsewhere classified (R26.2);Other abnormalities of gait and mobility (R26.89);Other symptoms and signs involving the nervous system (R29.898);Repeated falls (R29.6)    Time: 7253-6644 PT Time Calculation (min) (ACUTE ONLY): 55 min   Charges:   PT Evaluation $PT Eval Moderate Complexity: 1 Mod PT Treatments $Therapeutic Exercise: 8-22 mins       3:53 PM, 09/27/21 Etta Grandchild, PT, DPT Physical Therapist - [facility]  [phone] ([place])     Ethel C 09/27/2021, 3:50 PM

## 2021-09-27 NOTE — Hospital Course (Addendum)
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  No loss of consciousness.  In the emergency room, CT scan of the head unremarkable, but she was found to have a right displaced subtrochanteric proximal femur fracture.  Patient seen by orthopedic surgery and following admission to the hospitalist service, patient was taken for an IM nail.  Postop patient has been doing well.  Noted to have some very mild confusion although not severe.  Patient did have a noticeable drop in her hemoglobin from 10.8 on admission to 7.4 on postop day 1.  She was transfused 1 unit packed red blood cells.  Hemoglobin initially improved although mild drop over the next few days.  Transfusing additional unit of packed red blood cells today.

## 2021-09-27 NOTE — Progress Notes (Signed)
PT Cancellation Note  Patient Details Name: Nancy Rodriguez MRN: [id number] DOB: 01/03/1946   Cancelled Treatment:    Reason Eval/Treat Not Completed: Medical issues which prohibited therapy (Chart reviewed, analgesics coordinated. Pt currently with radiology getting a bedside wrist roentgenogram. RN says blood to follow. Will check back later in day.)  10:56 AM, 09/27/21 Etta Grandchild, PT, DPT Physical Therapist - [facility]  [phone] ([place])    Kamil Mchaffie C 09/27/2021, 10:54 AM

## 2021-09-27 NOTE — Progress Notes (Signed)
Subjective:  POD #1 s/p IM fixation for right subtrochanteric hip fracture.   Patient reports right hip pain as mild.  She is complaining of left wrist pain.    Objective:   VITALS:   Vitals:   09/26/21 1943 09/27/21 0236 09/27/21 0335 09/27/21 0807  BP: 131/61 110/63 121/64 (!) 100/58  Pulse: 100 (!) 108 (!) 103 100  Resp: '18 17 16 18  '$ Temp: 97.6 F (36.4 C)  98.3 F (36.8 C) 98.3 F (36.8 C)  TempSrc:      SpO2: 97%  98% 100%  Weight:      Height:        PHYSICAL EXAM: Right lower extremity Neurovascular intact Sensation intact distally Intact pulses distally Dorsiflexion/Plantar flexion intact Incision: scant drainage No cellulitis present Compartment soft  LABS  Results for orders placed or performed during the hospital encounter of 09/26/21 (from the past 24 hour(s))  CBG monitoring, ED     Status: Abnormal   Collection Time: 09/26/21 12:31 PM  Result Value Ref Range   Glucose-Capillary 194 (H) 70 - 99 mg/dL  CBG monitoring, ED     Status: Abnormal   Collection Time: 09/26/21  4:57 PM  Result Value Ref Range   Glucose-Capillary 179 (H) 70 - 99 mg/dL  Glucose, capillary     Status: Abnormal   Collection Time: 09/26/21 10:56 PM  Result Value Ref Range   Glucose-Capillary 200 (H) 70 - 99 mg/dL  Glucose, capillary     Status: Abnormal   Collection Time: 09/27/21  2:25 AM  Result Value Ref Range   Glucose-Capillary 188 (H) 70 - 99 mg/dL  CBC     Status: Abnormal   Collection Time: 09/27/21  4:35 AM  Result Value Ref Range   WBC 11.3 (H) 4.0 - 10.5 K/uL   RBC 2.75 (L) 3.87 - 5.11 MIL/uL   Hemoglobin 7.4 (L) 12.0 - 15.0 g/dL   HCT 21.2 (L) 36.0 - 46.0 %   MCV 77.1 (L) 80.0 - 100.0 fL   MCH 26.9 26.0 - 34.0 pg   MCHC 34.9 30.0 - 36.0 g/dL   RDW 23.3 (H) 11.5 - 15.5 %   Platelets 333 150 - 400 K/uL   nRBC 0.0 0.0 - 0.2 %  Basic metabolic panel     Status: Abnormal   Collection Time: 09/27/21  4:35 AM  Result Value Ref Range   Sodium 139 135 - 145  mmol/L   Potassium 4.4 3.5 - 5.1 mmol/L   Chloride 109 98 - 111 mmol/L   CO2 23 22 - 32 mmol/L   Glucose, Bld 196 (H) 70 - 99 mg/dL   BUN 30 (H) 8 - 23 mg/dL   Creatinine, Ser 1.27 (H) 0.44 - 1.00 mg/dL   Calcium 8.1 (L) 8.9 - 10.3 mg/dL   GFR, Estimated 44 (L) >60 mL/min   Anion gap 7 5 - 15  Glucose, capillary     Status: Abnormal   Collection Time: 09/27/21  8:16 AM  Result Value Ref Range   Glucose-Capillary 187 (H) 70 - 99 mg/dL  Prepare RBC (crossmatch)     Status: None   Collection Time: 09/27/21  9:44 AM  Result Value Ref Range   Order Confirmation      DUPLICATE REQUEST Performed at [facility], [address] [place], [place] [address]   Prepare RBC (crossmatch)     Status: None (Preliminary result)   Collection Time: 09/27/21 10:00 AM  Result Value Ref Range  Order Confirmation PENDING     DG FEMUR, MIN 2 VIEWS RIGHT  Result Date: 09/26/2021 CLINICAL DATA:  Status post right IM nail fixation EXAM: RIGHT FEMUR 2 VIEWS; PORTABLE PELVIS 1-2 VIEWS COMPARISON:  09/26/2021 intraoperative images and hip radiographs FINDINGS: Status post intramedullary nail fixation of a previously noted proximal right femoral diaphyseal fracture with improved alignment of the fracture components. No perihardware lucency or additional acute fracture in the pelvis or right femur. Air within the soft tissues, not unexpected post surgically. Superficial skin staples. IMPRESSION: Expected postoperative appearance, status post intramedullary nail fixation of a right femoral fracture. Electronically Signed   By: Merilyn Baba M.D.   On: 09/26/2021 17:38   Pelvis Portable  Result Date: 09/26/2021 CLINICAL DATA:  Status post right IM nail fixation EXAM: RIGHT FEMUR 2 VIEWS; PORTABLE PELVIS 1-2 VIEWS COMPARISON:  09/26/2021 intraoperative images and hip radiographs FINDINGS: Status post intramedullary nail fixation of a previously noted proximal right femoral diaphyseal fracture with improved  alignment of the fracture components. No perihardware lucency or additional acute fracture in the pelvis or right femur. Air within the soft tissues, not unexpected post surgically. Superficial skin staples. IMPRESSION: Expected postoperative appearance, status post intramedullary nail fixation of a right femoral fracture. Electronically Signed   By: Merilyn Baba M.D.   On: 09/26/2021 17:38   DG FEMUR, MIN 2 VIEWS RIGHT  Result Date: 09/26/2021 CLINICAL DATA:  Right proximal femoral ORIF EXAM: RIGHT FEMUR 2 VIEWS COMPARISON:  Pelvis and right hip radiographs 09/26/2021 FINDINGS: Images were performed intraoperatively without the presence of a radiologist. Redemonstration of oblique proximal right femoral diaphyseal fracture. New long-stem cephalo medullary nail fixation of this fracture with improved now anatomic alignment. No complication visualized. Total fluoroscopy images: 6 Total fluoroscopy time: 183 seconds Please see intraoperative findings for further detail. IMPRESSION: Intraoperative fluoroscopy for proximal right femoral ORIF. Electronically Signed   By: Yvonne Kendall M.D.   On: 09/26/2021 17:04   DG C-Arm 1-60 Min-No Report  Result Date: 09/26/2021 Fluoroscopy was utilized by the requesting physician.  No radiographic interpretation.   DG C-Arm 1-60 Min-No Report  Result Date: 09/26/2021 Fluoroscopy was utilized by the requesting physician.  No radiographic interpretation.   DG C-Arm 1-60 Min-No Report  Result Date: 09/26/2021 Fluoroscopy was utilized by the requesting physician.  No radiographic interpretation.   DG Chest Portable 1 View  Result Date: 09/26/2021 CLINICAL DATA:  Fall, preop EXAM: PORTABLE CHEST 1 VIEW COMPARISON:  None Available. FINDINGS: Elevation of the right hemidiaphragm. Heart and mediastinal contours are within normal limits. No focal opacities or effusions. No acute bony abnormality. IMPRESSION: No active disease. Electronically Signed   By: Rolm Baptise  M.D.   On: 09/26/2021 03:11   DG Hip Unilat W or Wo Pelvis 2-3 Views Right  Result Date: 09/26/2021 CLINICAL DATA:  Fall EXAM: DG HIP (WITH OR WITHOUT PELVIS) 2-3V RIGHT COMPARISON:  None Available. FINDINGS: There is a fracture through the proximal shaft of the right femur. Femoral shaft is displaced medially relative to the femoral neck. No subluxation or dislocation at the hip. IMPRESSION: Displaced right proximal femoral shaft fracture. Electronically Signed   By: Rolm Baptise M.D.   On: 09/26/2021 03:07   CT Cervical Spine Wo Contrast  Result Date: 09/26/2021 CLINICAL DATA:  Facial trauma, blunt.  Unwitnessed fall. EXAM: CT CERVICAL SPINE WITHOUT CONTRAST TECHNIQUE: Multidetector CT imaging of the cervical spine was performed without intravenous contrast. Multiplanar CT image reconstructions were also generated. RADIATION DOSE REDUCTION:  This exam was performed according to the departmental dose-optimization program which includes automated exposure control, adjustment of the mA and/or kV according to patient size and/or use of iterative reconstruction technique. COMPARISON:  None Available. FINDINGS: Alignment: No subluxation Skull base and vertebrae: No acute fracture. No primary bone lesion or focal pathologic process. Soft tissues and spinal canal: No prevertebral fluid or swelling. No visible canal hematoma. Disc levels:  Diffuse degenerative disc disease and facet disease. Upper chest: No acute findings Other: None IMPRESSION: No acute bony abnormality. Electronically Signed   By: Rolm Baptise M.D.   On: 09/26/2021 02:46   CT Head Wo Contrast  Result Date: 09/26/2021 CLINICAL DATA:  Head trauma, minor (Age >= 65y).  Unwitnessed fall EXAM: CT HEAD WITHOUT CONTRAST TECHNIQUE: Contiguous axial images were obtained from the base of the skull through the vertex without intravenous contrast. RADIATION DOSE REDUCTION: This exam was performed according to the departmental dose-optimization program  which includes automated exposure control, adjustment of the mA and/or kV according to patient size and/or use of iterative reconstruction technique. 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 No new hemorrhage, hydrocephalus or acute infarction. No mass effect or midline shift. Mild atrophy. Vascular: No hyperdense vessel or unexpected calcification. Skull: No acute calvarial abnormality. Sinuses/Orbits: No acute findings Other: None IMPRESSION: Small bilateral chronic subdural hematomas, stable or slightly smaller since prior study. No acute intracranial abnormality. Electronically Signed   By: Rolm Baptise M.D.   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  I am ordering 1 unit of PRBCs.  I have also ordered a left wrist x-ray to evaluate for fracture given her recent fall.  Patient will begin Lovenox for DVT prophylaxis if medically indicated given her stage III chronic kidney disease.  Will order pharmacy consult for Lovenox.  Patient is weightbearing as tolerated in the right lower extremity.  She will begin physical therapy today as tolerated.  Patient will require skilled nursing facility upon discharge.  Patient will follow-up in the office 10-14 days following discharge from the hospital.    Thornton Park , MD 09/27/2021, 10:37 AM

## 2021-09-27 NOTE — Evaluation (Signed)
Occupational Therapy Evaluation Patient Details Name: Nancy Rodriguez MRN: [id number] DOB: 10-08-1945 Today's Date: 09/27/2021   History of Present Illness Nancy Rodriguez is a  76 y.o. female with a diagnosis of right displaced reverse obliquity subtrochanteric proximal femur fracture, undergoing intramedullary fixation on 09/26/2021. 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   Clinical Impression   Nancy Rodriguez presents today with generalized weakness, limited endurance, 6/10 pain, lethargy, and confusion. She requires Mod-Max A +2 for bed mobility and transfers and displays poor standing balance and tolerance. PTA, pt lives in a single-story home with her daughter, 3 STE, uses a RW for ambulation, and reports greater than 1 fall a month. Pt is motivated to participate in the rehab process, diligently trying to engage in bed mobility and transfers today, despite considerable pain. She also expresses eagerness to return home. Given the considerable decline in her fxl mobility, however, unless she makes great improvement during this hospitalization, DC to STR facility will likely be necessary to allow pt to return to her PLOF.   Recommendations for follow up therapy are one component of a multi-disciplinary discharge planning process, led by the attending physician.  Recommendations may be updated based on patient status, additional functional criteria and insurance authorization.   Follow Up Recommendations  Skilled nursing-short term rehab (<3 hours/day)    Assistance Recommended at Discharge Frequent or constant Supervision/Assistance  Patient can return home with the following A lot of help with walking and/or transfers;A lot of help with bathing/dressing/bathroom;Assistance with cooking/housework;Help with stairs or ramp for  entrance;Assist for transportation    Functional Status Assessment  Patient has had a recent decline in their functional status and demonstrates the ability to make significant improvements in function in a reasonable and predictable amount of time.  Equipment Recommendations       Recommendations for Other Services       Precautions / Restrictions Precautions Precautions: Fall Restrictions Weight Bearing Restrictions: No      Mobility Bed Mobility Overal bed mobility: Needs Assistance Bed Mobility: Supine to Sit     Supine to sit: +2 for physical assistance, Max assist          Transfers Overall transfer level: Needs assistance Equipment used: Rolling walker (2 wheels) Transfers: Bed to chair/wheelchair/BSC, Sit to/from Stand Sit to Stand: Mod assist, +2 physical assistance Stand pivot transfers: +2 physical assistance, Mod assist         General transfer comment: Mod A +2, 2/2 pain, limited ROM of RLE      Balance Overall balance assessment: Needs assistance Sitting-balance support: Bilateral upper extremity supported Sitting balance-Leahy Scale: Fair Sitting balance - Comments: requires consistent Mod A support to counteract posterior lean; posterior lean reduces pt's pain level. Postural control: Posterior lean Standing balance support: Bilateral upper extremity supported Standing balance-Leahy Scale: Poor Standing balance comment: Mod A +2 support to maintain standing balance; pt requires cueing for using UE and RW to support balance                           ADL either performed or assessed with clinical judgement   ADL Overall ADL's : Needs assistance/impaired Eating/Feeding: Set up;Minimal assistance Eating/Feeding Details (indicate cue type and reason): Required some assistance with opening containers and IDing food, 2/2 confusion/lethargy. Able to feed self. Grooming: Dance movement psychotherapist;Set up;Supervision/safety;Sitting  Upper  Body Dressing : Minimal assistance;Bed level                           Vision         Perception     Praxis      Pertinent Vitals/Pain Pain Assessment Pain Assessment: 0-10 Pain Score: 6  Pain Location: R LE Pain Descriptors / Indicators: Grimacing, Moaning, Operative site guarding Pain Intervention(s): Limited activity within patient's tolerance, Repositioned, Ice applied     Hand Dominance     Extremity/Trunk Assessment Upper Extremity Assessment Upper Extremity Assessment: Generalized weakness   Lower Extremity Assessment Lower Extremity Assessment: Generalized weakness;RLE deficits/detail RLE Deficits / Details: s/p intramedullary fixation of femoral fx RLE: Unable to fully assess due to pain RLE Sensation: WNL       Communication Communication Communication: No difficulties   Cognition Arousal/Alertness: Awake/alert, Lethargic Behavior During Therapy: WFL for tasks assessed/performed Overall Cognitive Status: Within Functional Limits for tasks assessed                                 General Comments: Pt is motivated to participate and is very pleasant. She is alert and oriented to self, place, time. She is aware that she is in the hospital but did not know that she had a femur fx and had been in surgery. Reports that she feels "confused" and is somewhat lethargic. RN suspects this is 2/2 pain meds.     General Comments       Exercises Other Exercises Other Exercises: Educ re: PoC, falls prevention, home and routines modifications; correct use of RW   Shoulder Instructions      Home Living Family/patient expects to be discharged to:: Private residence Living Arrangements: Children Available Help at Discharge: Family;Available 24 hours/day Type of Home: House Home Access: Stairs to enter [employer] of Steps: 3 Entrance Stairs-Rails: Right;Left;Can reach both Home Layout: One level         Bathroom Toilet:  Standard                Prior Functioning/Environment Prior Level of Function : Needs assist;History of Falls (last six months)             Mobility Comments: Until recently has been ambulating with quad cane; recently changed to RW for increased stability. ADLs Comments: Daughter provides Min A for bathing and assists with all IADL. Pt reports ~ 7 falls in previous 6 months.        OT Problem List: Decreased strength;Decreased range of motion;Decreased activity tolerance;Impaired balance (sitting and/or standing);Pain      OT Treatment/Interventions: Self-care/ADL training;Therapeutic exercise;Patient/family education;Energy conservation;Therapeutic activities;Balance training;DME and/or AE instruction    OT Goals(Current goals can be found in the care plan section) Acute Rehab OT Goals Patient Stated Goal: to go home OT Goal Formulation: With patient Time For Goal Achievement: 10/11/21 Potential to Achieve Goals: Good ADL Goals Pt Will Perform Lower Body Bathing: with min assist;sitting/lateral leans Pt Will Perform Lower Body Dressing: with min assist;sitting/lateral leans Pt Will Transfer to Toilet: regular height toilet;with mod assist Additional ADL Goal #1: Pt will identify/demonstrate 2+ falls prevention strategies  OT Frequency: Min 2X/week    Co-evaluation              AM-PAC OT "6 Clicks" Daily Activity     Outcome Measure Help from another person eating meals?: A  Little Help from another person taking care of personal grooming?: A Little Help from another person toileting, which includes using toliet, bedpan, or urinal?: A Lot Help from another person bathing (including washing, rinsing, drying)?: A Lot Help from another person to put on and taking off regular upper body clothing?: A Little Help from another person to put on and taking off regular lower body clothing?: A Lot 6 Click Score: 15   End of Session Equipment Utilized During Treatment:  Rolling walker (2 wheels) Nurse Communication: Patient requests pain meds;Mobility status  Activity Tolerance: Patient tolerated treatment well;Patient limited by pain Patient left: in chair;with family/visitor present;with nursing/sitter in room;with call bell/phone within reach  OT Visit Diagnosis: Unsteadiness on feet (R26.81);Pain;Muscle weakness (generalized) (M62.81);History of falling (Z91.81) Pain - Right/Left: Right Pain - part of body: Leg                Time: 1254-1340 OT Time Calculation (min): 46 min Charges:  OT General Charges $OT Visit: 1 Visit OT Evaluation $OT Eval Moderate Complexity: 1 Mod OT Treatments $Self Care/Home Management : 38-52 mins Josiah Lobo, PhD, MS, OTR/L 09/27/21, 2:15 PM

## 2021-09-27 NOTE — Progress Notes (Signed)
[facility] Progress Note  Patient: Nancy Rodriguez    SAY:[id number]  DOA: 09/26/2021    Date of Service: the patient was seen and examined on 09/27/2021  Brief hospital course: 76 year old female with past medical history of amyloid of the liver and kidneys on chemotherapy plus steroids as well as protein calorie malnutrition, diabetes mellitus and stage III chronic kidney disease who presented to the emergency room after she had a witnessed fall in which she fell backwards landing on her right hip and hitting her head.  No loss of consciousness.  In the emergency room, CT scan of the head unremarkable, but she was found to have a right displaced subtrochanteric proximal femur fracture.  Patient seen by orthopedic surgery and following admission to the hospitalist service, patient was taken for an IM nail.  Postop patient has been doing well.  Noted to have some very mild confusion although not severe.  Patient did have a noticeable drop in her hemoglobin from 10.8 on admission to 7.4 on postop day 1.  She was transfused 1 unit packed red blood cells.  Assessment and Plan: Assessment and Plan: * Fracture of femoral shaft, right, closed ([place]) Status post IM nail by orthopedic surgery on 6/13.  Acute postoperative anemia due to expected blood loss Secondary to surgery.  Patient does not show signs of active bleeding.  Transfuse 1 unit packed red blood cells.  Hypertensive urgency BP 180/82 Hydralazine IV as needed while n.p.o.  Chronic kidney disease, stage 3a ([place]) Creatinine 1.12 on 6/5.  Hemoglobin on 6/14 at 1.27, likely some acute kidney injury secondary to blood loss anemia and should improve with blood transfusion.  Continue to follow.  Protein-calorie malnutrition, severe (HCC) Nutrition Status: Nutrition Problem: Increased nutrient needs Etiology: hip fracture Signs/Symptoms: estimated needs   Patient has only had preliminary nutritionist evaluation prior to surgery.   Follow-up planned  Delirium due to multiple etiologies, persistent, hypoactive Suspect secondary to anesthesia, medication for pain and sleep deprivation.  Patient does not have a previous history of dementia.  She is able to appropriately interact with me and answer my questions to name, knows that she is at [facility] and even corrected herself on the date when she initially said 44 and then immediately said I meant to say 2023.  She knows who the president is.  When asked what city she was in, she kept repeating: Hospital.  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  Last seen by Dr. Tasia Catchings on 6/5 Most recent labs from 6/5 showing AST 47, ALT 30, alk phos 898 with total bilirubin of 4.7  Preoperative clearance-resolved as of 09/27/2021 Preoperative clearance is pending Blood work including CMP and CBC pending at the time of admission Chest x-ray with no active disease.  EKG pending Patient had a cardiac work-up that included cardiac MRI and echocardiogram in May 2022 that showed no evidence of amyloid.  EF was 71% Patient ambulates at baseline but with high fall risk so will benefit to early repair. Low to low moderate risk of cardiopulmonary events Overall poor prognostic outlook based on poor nutritional status and chronic medical conditions        Body mass index is 21.27 kg/m.  Nutrition Problem: Increased nutrient needs Etiology: hip fracture     Consultants: Orthopedic surgery  Procedures: Status post IM  nail 6/13  Antimicrobials: Preop Ancef  Code Status: Full code   Subjective: Patient states she is mostly tired, minimal pain  Objective: Vital signs were reviewed and unremarkable. Vitals:   09/27/21 1402 09/27/21 1541  BP: (!) 105/55 (!) 93/54  Pulse:  95 86  Resp: 16 18  Temp: (!) 97.5 F (36.4 C) 98.3 F (36.8 C)  SpO2: 97% 98%    Intake/Output Summary (Last 24 hours) at 09/27/2021 1722 Last data filed at 09/27/2021 1359 Gross per 24 hour  Intake 479.5 ml  Output 300 ml  Net 179.5 ml   Filed Weights   09/26/21 0147 09/26/21 1237  Weight: 55.3 kg 55.3 kg   Body mass index is 21.27 kg/m.  Exam:  General: Alert and oriented x2, no acute distress HEENT: Normocephalic and atraumatic, mucous membranes are slightly dry Cardiovascular: Regular rate and rhythm, S1-S2 Respiratory: Clear to auscultation bilaterally Abdomen: Soft, nontender, nondistended, positive bowel sounds Musculoskeletal: No clubbing or cyanosis or edema Skin: No skin breaks, tears or lesions other than surgical site Psychiatry: Appropriate, no evidence of psychoses Neurology: No focal deficits  Data Reviewed: Noted drop in hemoglobin and slight increase in creatinine  Disposition:  Status is: Inpatient Remains inpatient appropriate because: Patient will need skilled nursing    Anticipated discharge date: 6/16   Family Communication: Daughter at the bedside DVT Prophylaxis: enoxaparin (LOVENOX) injection 40 mg Start: 09/27/21 0800 SCDs Start: 09/26/21 1843 Place TED hose Start: 09/26/21 1843    Author: Annita Brod ,MD 09/27/2021 5:22 PM  To reach On-call, see care teams to locate the attending and reach out via [email or web address]. Between 7PM-7AM, please contact night-coverage If you still have difficulty reaching the attending provider, please page the [facility] (Director on Call) for [facility] on amion for assistance.

## 2021-09-27 NOTE — Inpatient Diabetes Management (Signed)
Inpatient Diabetes Program Recommendations  AACE/ADA: New Consensus Statement on Inpatient Glycemic Control   Target Ranges:  Prepandial:   less than 140 mg/dL      Peak postprandial:   less than 180 mg/dL (1-2 hours)      Critically ill patients:  140 - 180 mg/dL    Latest Reference Range & Units 09/27/21 02:25 09/27/21 08:16  Glucose-Capillary 70 - 99 mg/dL 188 (H) 187 (H)    Latest Reference Range & Units 09/26/21 12:31 09/26/21 16:57 09/26/21 22:56  Glucose-Capillary 70 - 99 mg/dL 194 (H) 179 (H) 200 (H)   Review of Glycemic Control  Diabetes history: DM2 Outpatient Diabetes medications: Lantus 15 units QHS, Humalog TID per sliding scale; Decadron 5 mg once a week Current orders for Inpatient glycemic control: Novolog 0-15 units TID with meals, Novolog 0-5 units QHS  Inpatient Diabetes Program Recommendations:    Insulin: Please consider ordering Semglee 5 units Q24H.  Thanks, Barnie Alderman, RN, MSN, [place] Diabetes Coordinator Inpatient Diabetes Program [phone] (Team Pager from 8am to [place])

## 2021-09-28 ENCOUNTER — Telehealth: Payer: Self-pay

## 2021-09-28 DIAGNOSIS — N179 Acute kidney failure, unspecified: Secondary | ICD-10-CM | POA: Diagnosis not present

## 2021-09-28 DIAGNOSIS — D62 Acute posthemorrhagic anemia: Secondary | ICD-10-CM | POA: Diagnosis not present

## 2021-09-28 DIAGNOSIS — S72321A Displaced transverse fracture of shaft of right femur, initial encounter for closed fracture: Secondary | ICD-10-CM | POA: Diagnosis not present

## 2021-09-28 DIAGNOSIS — E440 Moderate protein-calorie malnutrition: Secondary | ICD-10-CM | POA: Diagnosis present

## 2021-09-28 LAB — CBC
HCT: 22.3 % — ABNORMAL LOW (ref 36.0–46.0)
Hemoglobin: 8 g/dL — ABNORMAL LOW (ref 12.0–15.0)
MCH: 28 pg (ref 26.0–34.0)
MCHC: 35.9 g/dL (ref 30.0–36.0)
MCV: 78 fL — ABNORMAL LOW (ref 80.0–100.0)
Platelets: 277 10*3/uL (ref 150–400)
RBC: 2.86 MIL/uL — ABNORMAL LOW (ref 3.87–5.11)
RDW: 20.8 % — ABNORMAL HIGH (ref 11.5–15.5)
WBC: 13.9 10*3/uL — ABNORMAL HIGH (ref 4.0–10.5)
nRBC: 0 % (ref 0.0–0.2)

## 2021-09-28 LAB — GLUCOSE, CAPILLARY
Glucose-Capillary: 147 mg/dL — ABNORMAL HIGH (ref 70–99)
Glucose-Capillary: 215 mg/dL — ABNORMAL HIGH (ref 70–99)
Glucose-Capillary: 153 mg/dL — ABNORMAL HIGH (ref 70–99)
Glucose-Capillary: 124 mg/dL — ABNORMAL HIGH (ref 70–99)
Glucose-Capillary: 97 mg/dL (ref 70–99)

## 2021-09-28 LAB — BASIC METABOLIC PANEL
Anion gap: 4 — ABNORMAL LOW (ref 5–15)
BUN: 45 mg/dL — ABNORMAL HIGH (ref 8–23)
CO2: 23 mmol/L (ref 22–32)
Calcium: 8.3 mg/dL — ABNORMAL LOW (ref 8.9–10.3)
Chloride: 113 mmol/L — ABNORMAL HIGH (ref 98–111)
Creatinine, Ser: 1.99 mg/dL — ABNORMAL HIGH (ref 0.44–1.00)
GFR, Estimated: 25 mL/min — ABNORMAL LOW (ref >60)
Glucose, Bld: 168 mg/dL — ABNORMAL HIGH (ref 70–99)
Potassium: 4.9 mmol/L (ref 3.5–5.1)
Sodium: 140 mmol/L (ref 135–145)

## 2021-09-28 MED ORDER — ACYCLOVIR 200 MG PO CAPS
400.0000 mg | ORAL_CAPSULE | Freq: Two times a day (BID) | ORAL | Status: DC
  Administered 2021-09-28 – 2021-10-03 (×10): 400 mg via ORAL
  Filled 2021-09-28 (×10): qty 2

## 2021-09-28 MED ORDER — ENSURE ENLIVE PO LIQD
237.0000 mL | Freq: Three times a day (TID) | ORAL | Status: DC
  Administered 2021-09-28 – 2021-10-03 (×16): 237 mL via ORAL

## 2021-09-28 MED ORDER — HEPARIN SODIUM (PORCINE) 5000 UNIT/ML IJ SOLN
5000.0000 [IU] | Freq: Three times a day (TID) | INTRAMUSCULAR | Status: DC
Start: 2021-09-29 — End: 2021-10-03
  Administered 2021-09-29 – 2021-10-03 (×14): 5000 [IU] via SUBCUTANEOUS
  Filled 2021-09-28 (×14): qty 1

## 2021-09-28 MED ORDER — TRAMADOL HCL 50 MG PO TABS
50.0000 mg | ORAL_TABLET | Freq: Two times a day (BID) | ORAL | Status: DC
  Administered 2021-09-28 – 2021-10-03 (×10): 50 mg via ORAL
  Filled 2021-09-28 (×10): qty 1

## 2021-09-28 MED ORDER — ENOXAPARIN SODIUM 30 MG/0.3ML IJ SOSY: 30.0000 mg | PREFILLED_SYRINGE | INTRAMUSCULAR | Status: DC

## 2021-09-28 NOTE — Anesthesia Postprocedure Evaluation (Signed)
Anesthesia Post Note  Patient: Nancy Rodriguez  Procedure(s) Performed: INTRAMEDULLARY (IM) NAIL INTERTROCHANTRIC (Right: Hip)  Patient location during evaluation: Nursing Unit Anesthesia Type: Spinal Level of consciousness: awake Pain management: satisfactory to patient Vital Signs Assessment: post-procedure vital signs reviewed and stable Respiratory status: respiratory function stable Cardiovascular status: stable Postop Assessment: no backache, no headache, patient able to bend at knees, no apparent nausea or vomiting and adequate PO intake Anesthetic complications: no   No notable events documented.   Last Vitals:  Vitals:   09/28/21 0345 09/28/21 0759  BP: 117/64 118/64  Pulse: (!) 102 (!) 110  Resp: 16 18  Temp: 36.9 C 37.4 C  SpO2: 95% 95%    Last Pain:  Vitals:   09/28/21 0759  TempSrc: Oral  PainSc:                  Blima Singer

## 2021-09-28 NOTE — Progress Notes (Signed)
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  No loss of consciousness.  In the emergency room, CT scan of the head unremarkable, but she was found to have a right displaced subtrochanteric proximal femur fracture.  Patient seen by orthopedic surgery and following admission to the hospitalist service, patient was taken for an IM nail.  Postop patient has been doing well.  Noted to have some very mild confusion although not severe.  Patient did have a noticeable drop in her hemoglobin from 10.8 on admission to 7.4 on postop day 1.  She was transfused 1 unit packed red blood cells.  Assessment and Plan: Assessment and Plan: * Fracture of femoral shaft, right, closed (HCC) Status post IM nail by orthopedic surgery on 6/13.  AKI (acute kidney injury) (HCC) Creatinine on 6/13 normal and since then has been trending upward.  Possibly from blood loss.  At 1.99 today.  Have started IV fluids.  Recheck labs in the morning  Acute postoperative anemia due to expected blood loss Secondary to surgery.  Patient does not show signs of active bleeding.  Transfused 1 unit packed red blood cells 6/14.  Hypertensive urgency BP 180/82.  Blood pressure much improved and actually soft at times since then, likely from blood loss Hydralazine IV as needed while n.p.o.  Chronic kidney disease, stage 3a (HCC) Creatinine 1.12 on 6/5.  Hemoglobin on 6/14 at 1.27, likely some acute kidney injury secondary to blood loss anemia and should  improve with blood transfusion.  Continue to follow.  Protein-calorie malnutrition, severe (HCC) Nutrition Status: Nutrition Problem: Increased nutrient needs Etiology: hip fracture Signs/Symptoms: estimated needs   Patient has only had preliminary nutritionist evaluation prior to surgery.  Follow-up planned  Delirium due to multiple etiologies, persistent, hypoactive Suspect secondary to anesthesia, medication for pain and sleep deprivation.  Patient does not have a previous history of dementia.  Remains overall alert and oriented.  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  Last seen by Dr. Yu on 6/5 Most recent labs from 6/5 showing AST 47, ALT 30, alk phos 898 with total bilirubin of 4.7  Preoperative clearance-resolved as of 09/27/2021 Preoperative clearance is pending Blood work including CMP and CBC pending at the time of admission Chest x-ray with no active disease.  EKG pending Patient had a cardiac work-up that included cardiac MRI and echocardiogram in May 2022 that showed no evidence of amyloid.  EF was 71% Patient ambulates at baseline but with high fall risk so will benefit to early repair. Low to low moderate risk of cardiopulmonary events Overall poor prognostic outlook based on poor nutritional status and chronic medical conditions          Body mass index is 21.27 kg/m.  Nutrition Problem: Moderate Malnutrition Etiology: chronic illness (dementia)     Consultants: Orthopedic surgery  Procedures: Status  post IM nail 6/13 1 unit packed red blood cells transfused 6/14  Antimicrobials: Preop Ancef  Code Status: Full code   Subjective: Resting comfortably today.  Less pain.  Objective: Vital signs were reviewed and unremarkable. Vitals:   09/28/21 1152 09/28/21 1554  BP: (!) 114/57 (!) 113/55  Pulse: 89 88  Resp: 15 15  Temp: 98.2 F (36.8 C) 98.3 F (36.8 C)  SpO2: 97% 100%    Intake/Output Summary (Last 24 hours) at 09/28/2021 1739 Last data filed at 09/28/2021 1400 Gross per 24 hour  Intake 357 ml  Output 425 ml  Net -68 ml    Filed Weights   09/26/21 0147 09/26/21 1237  Weight: 55.3 kg 55.3 kg   Body mass index is 21.27 kg/m.  Exam:  General: Resting comfortably HEENT: Normocephalic and atraumatic, mucous membranes are slightly dry Cardiovascular: Regular rate and rhythm, S1-S2 Respiratory: Clear to auscultation bilaterally Abdomen: Soft, nontender, nondistended, positive bowel sounds Musculoskeletal: No clubbing or cyanosis or edema Skin: No skin breaks, tears or lesions other than surgical site Psychiatry: Appropriate, no evidence of psychoses Neurology: No focal deficits  Data Reviewed: Improvement in hemoglobin following blood and she is in and again mild drop again this morning.  Worsening creatinine.  Disposition:  Status is: Inpatient Remains inpatient appropriate because: Patient will need skilled nursing Improvement in creatinine    Anticipated discharge date: 6/16   Family Communication: Other daughter at the bedside DVT Prophylaxis: heparin injection 5,000 Units Start: 09/29/21 0600 SCDs Start: 09/26/21 1843 Place TED hose Start: 09/26/21 1843    Author:  K  ,MD 09/28/2021 5:39 PM  To reach On-call, see care teams to locate the attending and reach out via [email or web address]. Between 7PM-7AM, please contact night-coverage If you still have difficulty reaching the attending provider, please page the DOC (Director on Call)  for [facility] on amion for assistance.  

## 2021-09-28 NOTE — Progress Notes (Signed)
PHARMACIST - PHYSICIAN COMMUNICATION  CONCERNING:  Enoxaparin (Lovenox) for DVT Prophylaxis    RECOMMENDATION: Patient was prescribed enoxaprin '40mg'$  q24 hours for VTE prophylaxis.   Filed Weights   09/26/21 0147 09/26/21 1237  Weight: 55.3 kg (122 lb) 55.3 kg (122 lb)    Body mass index is 21.27 kg/m.  Estimated Creatinine Clearance: 20 mL/min (A) (by C-G formula based on SCr of 1.99 mg/dL (H)).   Based on [place] patient is candidate for enoxaparin '30mg'$  every 24 hours based on CrCl <31m/min or Weight <45kg  DESCRIPTION: [facility]  Patient is now receiving enoxaparin 30 mg every 24 hours    BLorna Dibble PharmD Clinical Pharmacist  09/28/2021 10:13 AM

## 2021-09-28 NOTE — Progress Notes (Signed)
Occupational Therapy Treatment Patient Details Name: Nancy Rodriguez MRN: [id number] DOB: March 12, 1946 Today's Date: 09/28/2021   History of present illness Nancy Rodriguez is a 49yoF who comes [facility] on 09/26/21 s/p fall, subtrochanteric femur fracture seen, taken to OR for IM fixation c Dr. Mack Guise on 6/13. Pt inititally in ED on 6/6 after fall with subsequent thigh pain upon weightbearing, tripped over a stationary bike at home, also hit her head and had subsequent AMS. PMH: CHF,RA, amyloidosis, , HTN, DM2, CKD3a. protein-calorie malnutrition. Postoperatively WBAT on Rt. Pt uses either a SPC or RW at baseline. Postop Hb down from 10.8 to 7.4, received some blood. Per DTR pt was AMB ad lib quite well, taking part in cardiac rehab, baseline mentation WNL.   OT comments  Nancy Rodriguez continues to remain lethargic this afternoon. She requires repeated cueing to engage, as well as greatly increased response time. She performs sit<>stand transfers with Mod A initially, then Max A on repeated attempts. Mod-Max A to maintain standing balance. Endorses 6/10 R leg pain with movement. Discussed energy conservation, falls prevention strategies with pt and family members. Pt at this point is accepting that she will need STR before returning home. Pt shows improvement from yesterday, when she needed Max A +2 for all transfers; however, she remains far off her PLOF.   Recommendations for follow up therapy are one component of a multi-disciplinary discharge planning process, led by the attending physician.  Recommendations may be updated based on patient status, additional functional criteria and insurance authorization.    Follow Up Recommendations  Skilled nursing-short term rehab (<3 hours/day)    Assistance Recommended at Discharge Frequent or constant Supervision/Assistance  Patient can return home with the following  A lot of help with walking and/or transfers;A lot of help with  bathing/dressing/bathroom;Assistance with cooking/housework;Help with stairs or ramp for entrance;Assist for transportation   Equipment Recommendations       Recommendations for Other Services      Precautions / Restrictions Precautions Precautions: Fall Restrictions Weight Bearing Restrictions: Yes RLE Weight Bearing: Weight bearing as tolerated       Mobility Bed Mobility Overal bed mobility: Needs Assistance             General bed mobility comments: received/left in recliner    Transfers Overall transfer level: Needs assistance Equipment used: Rolling walker (2 wheels) Transfers: Sit to/from Stand Sit to Stand: Max assist          Lateral/Scoot Transfers: Min assist General transfer comment: MaxA to raise from recliner, [place] to maintain standing balance. Min A for scooting in chair.     Balance Overall balance assessment: Needs assistance Sitting-balance support: Bilateral upper extremity supported Sitting balance-Leahy Scale: Fair Sitting balance - Comments: pt able to re-position herself in chair with MinA   Standing balance support: Bilateral upper extremity supported Standing balance-Leahy Scale: Poor Standing balance comment: Mod A +2 support to maintain standing balance; pt requires cueing for using UE and RW to support balance                           ADL either performed or assessed with clinical judgement   ADL                                              Extremity/Trunk Assessment Upper Extremity  Assessment Upper Extremity Assessment: Generalized weakness   Lower Extremity Assessment Lower Extremity Assessment: Generalized weakness;RLE deficits/detail RLE Deficits / Details: s/p intramedullary fixation of femoral fx RLE: Unable to fully assess due to pain RLE Sensation: WNL        Vision       Perception     Praxis      Cognition Arousal/Alertness: Lethargic Behavior During Therapy: WFL for  tasks assessed/performed Overall Cognitive Status: Impaired/Different from baseline                                 General Comments: Very lethargic        Exercises Other Exercises Other Exercises: Educ re: falls prevention, ECS, pacing, performing tasks in sitting, DC recs    Shoulder Instructions       General Comments R lateral hip dressing in tact    Pertinent Vitals/ Pain       Pain Assessment Pain Score: 6  Pain Descriptors / Indicators: Grimacing, Discomfort Pain Intervention(s): Limited activity within patient's tolerance, Repositioned, Monitored during session  Home Living                                          Prior Functioning/Environment              Frequency  Min 2X/week        Progress Toward Goals  OT Goals(current goals can now be found in the care plan section)  Progress towards OT goals: Progressing toward goals  Acute Rehab OT Goals OT Goal Formulation: With patient Time For Goal Achievement: 10/11/21 Potential to Achieve Goals: Good  Plan      Co-evaluation                 AM-PAC OT "6 Clicks" Daily Activity     Outcome Measure   Help from another person eating meals?: A Little Help from another person taking care of personal grooming?: A Little Help from another person toileting, which includes using toliet, bedpan, or urinal?: A Lot Help from another person bathing (including washing, rinsing, drying)?: A Lot Help from another person to put on and taking off regular upper body clothing?: A Little Help from another person to put on and taking off regular lower body clothing?: A Lot 6 Click Score: 15    End of Session Equipment Utilized During Treatment: Rolling walker (2 wheels)  OT Visit Diagnosis: Unsteadiness on feet (R26.81);Pain;Muscle weakness (generalized) (M62.81);History of falling (Z91.81) Pain - Right/Left: Right Pain - part of body: Leg   Activity Tolerance Patient  tolerated treatment well;Patient limited by pain   Patient Left in chair;with family/visitor present;with call bell/phone within reach   Nurse Communication          Time: 4332-9518 OT Time Calculation (min): 28 min  Charges: OT General Charges $OT Visit: 1 Visit OT Treatments $Self Care/Home Management : 23-37 mins  Josiah Lobo, PhD, MS, OTR/L 09/28/21, 2:44 PM

## 2021-09-28 NOTE — NC FL2 (Addendum)
[place] LEVEL OF CARE SCREENING TOOL     IDENTIFICATION  Patient Name: Nancy Rodriguez Birthdate: 02/04/1946 Sex: female Admission Date (Current Location): 09/26/2021  [facility] and [place] Number:  Engineering geologist and Address:  [facility], [address], [place], [place] [address]      Provider Number: [id number]  Attending Physician Name and Address:  Annita Brod, MD  Relative Name and Phone Number:       Current Level of Care: Hospital Recommended Level of Care: [place] Prior Approval Number:    Date Approved/Denied:   PASRR Number:  [id number]  Discharge Plan: SNF    Current Diagnoses: Patient Active Problem List   Diagnosis Date Noted   Acute postoperative anemia due to expected blood loss 09/27/2021   Delirium due to multiple etiologies, persistent, hypoactive 09/27/2021   Hypertensive urgency 09/26/2021   Diabetes mellitus without complication (HCC)    Chronic kidney disease, stage 3a ([place])    Protein-calorie malnutrition, severe ([place])    Fracture of femoral shaft, right, closed ([place])    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[place]) 08/21/2021   Subdural hematoma ([place]) 05/18/2021   Hypoalbuminemia 04/27/2021   Vaginal bleeding 04/27/2021   Hepatic cirrhosis ([place]) 04/27/2021   Hyperbilirubinemia 04/27/2021   IDA (iron deficiency anemia) 02/16/2021   Gastroesophageal reflux disease 11/10/2020   Lower extremity edema 10/14/2020   Elevated LFTs 10/12/2020   Loss of weight 10/12/2020   Anasarca 09/04/2020   Edema 08/13/2020   Multiple myeloma ([place]) 08/13/2020   Chemotherapy induced nausea and vomiting 07/27/2020   Hypokalemia 07/27/2020   Goals of care, counseling/discussion 07/20/2020   Microcytic anemia 07/06/2020   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[place]) 07/06/2020   Abnormal EKG 07/01/2020   Essential hypertension 07/01/2020   Hyperlipidemia associated with type 2  diabetes mellitus ([place]) 07/01/2020   Encounter for antineoplastic chemotherapy 06/17/2020   Transaminitis 06/17/2020   Hyperglycemia 06/17/2020   Amyloidosis ([place]) 05/20/2020   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    Orientation RESPIRATION BLADDER Height & Weight     Self, Time, Situation, Place  Normal Continent Weight: 122 lb (55.3 kg) Height:  5' 3.5" (161.3 cm)  BEHAVIORAL SYMPTOMS/MOOD NEUROLOGICAL BOWEL NUTRITION STATUS      Continent Diet (regular diet thin liquids)  AMBULATORY STATUS COMMUNICATION OF NEEDS Skin   Extensive Assist Verbally Surgical wounds (closed incision right hip and right thigh)                       Personal Care Assistance Level of Assistance  Dressing, Feeding, Bathing Bathing Assistance: Maximum assistance Feeding assistance: Independent Dressing Assistance: Maximum assistance     Functional Limitations Info  Sight, Hearing, Speech Sight Info: Adequate Hearing Info: Adequate Speech Info: Adequate    SPECIAL CARE FACTORS FREQUENCY  PT (By licensed PT), OT (By licensed OT)     PT Frequency: 5x OT Frequency: 5x            Contractures Contractures Info: Not present    Additional Factors Info  Code Status, Allergies Code Status Info: full code Allergies Info: Benazepril, Lisinopril, Tolmetin, Nsaids           Current Medications (09/28/2021):  This is the current hospital active medication list Current Facility-Administered Medications  Medication Dose Route Frequency Provider Last Rate Last Admin   0.9 %  sodium chloride infusion (Manually program via Guardrails IV Fluids)  Intravenous Once Annita Brod, MD       0.9 %  sodium chloride infusion   Intravenous Continuous Annita Brod, MD 10 mL/hr at 09/26/21 2029 Restarted at 09/26/21 2029   acetaminophen (TYLENOL) tablet 325-650 mg  325-650 mg Oral Q6H PRN Thornton Park, MD       acyclovir (ZOVIRAX) 200 MG capsule 800 mg  800 mg Oral  BID Renda Rolls, RPH   800 mg at 09/27/21 2058   albuterol (PROVENTIL) (2.5 MG/3ML) 0.083% nebulizer solution 3 mL  3 mL Inhalation Q4H PRN Thornton Park, MD       alum & mag hydroxide-simeth (MAALOX/MYLANTA) 200-200-20 MG/5ML suspension 30 mL  30 mL Oral Q4H PRN Thornton Park, MD       amLODipine (NORVASC) tablet 5 mg  5 mg Oral Daily Thornton Park, MD   5 mg at 09/27/21 0912   bisacodyl (DULCOLAX) suppository 10 mg  10 mg Rectal Daily PRN Thornton Park, MD       Chlorhexidine Gluconate Cloth 2 % PADS 6 each  6 each Topical Q0600 Thornton Park, MD   6 each at 09/28/21 0556   docusate sodium (COLACE) capsule 100 mg  100 mg Oral BID Thornton Park, MD   100 mg at 09/27/21 2058   [START ON 09/29/2021] enoxaparin (LOVENOX) injection 30 mg  30 mg Subcutaneous Q24H Beers, Brandon D, RPH       feeding supplement (GLUCERNA SHAKE) (GLUCERNA SHAKE) liquid 237 mL  237 mL Oral TID BM Thornton Park, MD   237 mL at 09/27/21 1448   fentaNYL (SUBLIMAZE) injection 25 mcg  25 mcg Intravenous Q5 Min x 2 PRN Thornton Park, MD   25 mcg at 09/26/21 1323   ferrous sulfate tablet 325 mg  325 mg Oral Daily Thornton Park, MD   325 mg at 09/27/21 0912   gabapentin (NEURONTIN) capsule 100 mg  100 mg Oral QHS Thornton Park, MD   100 mg at 09/27/21 2058   hydrALAZINE (APRESOLINE) injection 5 mg  5 mg Intravenous Q4H PRN Thornton Park, MD   5 mg at 09/26/21 0742   HYDROcodone-acetaminophen (NORCO/VICODIN) 5-325 MG per tablet 1-2 tablet  1-2 tablet Oral Q4H PRN Thornton Park, MD   1 tablet at 09/27/21 0912   insulin aspart (novoLOG) injection 0-15 Units  0-15 Units Subcutaneous TID WC Annita Brod, MD   2 Units at 09/28/21 9485   insulin aspart (novoLOG) injection 0-5 Units  0-5 Units Subcutaneous QHS Annita Brod, MD       lenalidomide (REVLIMID) capsule 10 mg  10 mg Oral Daily Annita Brod, MD   10 mg at 09/27/21 2059   loperamide (IMODIUM) capsule 2 mg  2 mg Oral See  admin instructions Thornton Park, MD       methocarbamol (ROBAXIN) tablet 500 mg  500 mg Oral Q6H PRN Thornton Park, MD       Or   methocarbamol (ROBAXIN) 500 mg in dextrose 5 % 50 mL IVPB  500 mg Intravenous Q6H PRN Thornton Park, MD       metoprolol succinate (TOPROL-XL) 24 hr tablet 50 mg  50 mg Oral Daily Thornton Park, MD   50 mg at 09/27/21 0912   morphine (PF) 2 MG/ML injection 0.5-1 mg  0.5-1 mg Intravenous Q2H PRN Thornton Park, MD       multivitamin with minerals tablet 1 tablet  1 tablet Oral Daily Thornton Park, MD   1 tablet at 09/27/21 [phone]  ondansetron (ZOFRAN) tablet 4 mg  4 mg Oral Q6H PRN Thornton Park, MD       Or   ondansetron [facility]) injection 4 mg  4 mg Intravenous Q6H PRN Thornton Park, MD       pantoprazole (PROTONIX) EC tablet 40 mg  40 mg Oral Daily Thornton Park, MD   40 mg at 09/27/21 0911   polyethylene glycol (MIRALAX / GLYCOLAX) packet 17 g  17 g Oral Daily PRN Thornton Park, MD   17 g at 09/27/21 0611   potassium chloride SA (KLOR-CON M) CR tablet 20 mEq  20 mEq Oral BID Thornton Park, MD   20 mEq at 09/27/21 2058   senna (SENOKOT) tablet 8.6 mg  1 tablet Oral BID Thornton Park, MD   8.6 mg at 09/27/21 2058   sertraline (ZOLOFT) tablet 50 mg  50 mg Oral Daily Thornton Park, MD   50 mg at 09/27/21 0912   traMADol (ULTRAM) tablet 50 mg  50 mg Oral Q6H Thornton Park, MD   50 mg at 09/28/21 4327     Discharge Medications: Please see discharge summary for a list of discharge medications.  Relevant Imaging Results:  Relevant Lab Results:   Additional Information SSN:[id number]  Eileen Stanford, LCSW

## 2021-09-28 NOTE — Telephone Encounter (Signed)
Spoke with daughter, place patient on CARE medical hold until further notice and plan from discharge.

## 2021-09-28 NOTE — Progress Notes (Signed)
Physical Therapy Treatment Patient Details Name: Nancy Rodriguez MRN: [id number] DOB: 02-06-1946 Today's Date: 09/28/2021   History of Present Illness Nancy Rodriguez is a 64yoF who comes to [facility] on 09/26/21 s/p fall, subtrochanteric femur fracture seen, taken to OR for IM fixation c Dr. Mack Guise on 6/13. Pt inititally in ED on 6/6 after fall with subsequent thigh pain upon weightbearing, tripped over a stationary bike at home, also hit her head and had subsequent AMS. PMH: CHF,RA, amyloidosis, , HTN, DM2, CKD3a. protein-calorie malnutrition. Postoperatively WBAT on Rt. Pt uses either a SPC or RW at baseline. Postop Hb down from 10.8 to 7.4, received some blood. Per DTR pt was AMB ad lib quite well, taking part in cardiac rehab, baseline mentation WNL.    PT Comments    Pt seen this am, received in bedside recliner. Remains lethargic with eyes closed during session, however able to open upon request. Pt c/o 6/10 R hip pain with AAROM. Sit to stand with MaxA, once standing pt required MinA to maintain balance for 1 minute increments, Poor tolerance for weight shifting onto R side. Continue to recommend short term rehab stay at SNF once medically cleared.    Recommendations for follow up therapy are one component of a multi-disciplinary discharge planning process, led by the attending physician.  Recommendations may be updated based on patient status, additional functional criteria and insurance authorization.  Follow Up Recommendations  Skilled nursing-short term rehab (<3 hours/day)     Assistance Recommended at Discharge Frequent or constant Supervision/Assistance  Patient can return home with the following Two people to help with walking and/or transfers;Direct supervision/assist for medications management;Two people to help with bathing/dressing/bathroom;Direct supervision/assist for financial management;Assist for transportation;Assistance with cooking/housework;Assistance with feeding;Help  with stairs or ramp for entrance   Equipment Recommendations  None recommended by PT (next facility will provide)    Recommendations for Other Services       Precautions / Restrictions Precautions Precautions: Fall Restrictions Weight Bearing Restrictions: Yes RLE Weight Bearing: Weight bearing as tolerated     Mobility  Bed Mobility               General bed mobility comments: in recliner at start adn finish; needs totalA + to scoot up in recliner    Transfers Overall transfer level: Needs assistance Equipment used: None Transfers: Sit to/from Stand Sit to Stand: Max assist           General transfer comment: MaxA to raise from recliner, MinA to maintain upright standing    Ambulation/Gait                   Stairs             Wheelchair Mobility    Modified Rankin (Stroke Patients Only)       Balance                                            Cognition Arousal/Alertness: Awake/alert (eyes remained closed throughout session) Behavior During Therapy: WFL for tasks assessed/performed Overall Cognitive Status: Impaired/Different from baseline                                 General Comments: Very lethargic, delayed requiring repeated vc's        Exercises General Exercises - Lower  Extremity Ankle Circles/Pumps: AAROM, Both, 10 reps Long Arc Quad: AAROM, Right, 10 reps Other Exercises Other Exercises: Pt required slow gently AAROM due to pain and cognitive status.    General Comments General comments (skin integrity, edema, etc.): R lateral hip dressing in tact      Pertinent Vitals/Pain Pain Assessment Pain Assessment: 0-10 Pain Score: 6  Pain Location: RLE Pain Descriptors / Indicators: Grimacing, Moaning, Operative site guarding Pain Intervention(s): Monitored during session, Limited activity within patient's tolerance    Home Living                          Prior Function             PT Goals (current goals can now be found in the care plan section) Acute Rehab PT Goals Patient Stated Goal: regain baseline mobility/mentation    Frequency    7X/week      PT Plan      Co-evaluation              AM-PAC PT "6 Clicks" Mobility   Outcome Measure  Help needed turning from your back to your side while in a flat bed without using bedrails?: Total Help needed moving from lying on your back to sitting on the side of a flat bed without using bedrails?: Total Help needed moving to and from a bed to a chair (including a wheelchair)?: Total Help needed standing up from a chair using your arms (e.g., wheelchair or bedside chair)?: Total Help needed to walk in hospital room?: Total Help needed climbing 3-5 steps with a railing? : Total 6 Click Score: 6    End of Session Equipment Utilized During Treatment: Gait belt Activity Tolerance: Patient limited by pain;Patient limited by fatigue Patient left: in chair;with family/visitor present;with call bell/phone within reach;with chair alarm set Nurse Communication: Mobility status PT Visit Diagnosis: Difficulty in walking, not elsewhere classified (R26.2);Other abnormalities of gait and mobility (R26.89);Other symptoms and signs involving the nervous system (R29.898);Repeated falls (R29.6)     Time: 1030-1100 PT Time Calculation (min) (ACUTE ONLY): 30 min  Charges:  $Therapeutic Exercise: 8-22 mins $Therapeutic Activity: 8-22 mins                    [name], PTA    [name] 09/28/2021, 11:17 AM

## 2021-09-28 NOTE — TOC Initial Note (Signed)
Transition of Care [facility]) - Initial/Assessment Note    Patient Details  Name: Nancy Rodriguez MRN: [id number] Date of Birth: Aug 13, 1945  [facility]) CM/SW Contact:    Eileen Stanford, LCSW Phone Number: 09/28/2021, 3:39 PM  Clinical Narrative:     CSW spoke with pt's daughter and she states they are agreeable to SNF at d/c. Pt's daughter is agreeable to referral fax out and ask that CSW provide bed offers to Son as he would be handling that.              Expected Discharge Plan: Skilled Nursing Facility Barriers to Discharge: Continued Medical Work up   Patient Goals and CMS Choice Patient states their goals for this hospitalization and ongoing recovery are:: for pt to get better   Choice offered to / list presented to : Adult Children  Expected Discharge Plan and Services Expected Discharge Plan: [place] In-house Referral: Clinical Social Work   Post Acute Care Choice: [place] Living arrangements for the past 2 months: [place]                                      Prior Living Arrangements/Services Living arrangements for the past 2 months: Single Family Home Lives with:: Adult Children Patient language and need for interpreter reviewed:: Yes Do you feel safe going back to the place where you live?: Yes      Need for Family Participation in Patient Care: Yes (Comment) Care giver support system in place?: Yes (comment)   Criminal Activity/Legal Involvement Pertinent to Current Situation/Hospitalization: No - Comment as needed  Activities of Daily Living Home Assistive Devices/Equipment: Cane (specify quad or straight), Walker (specify type), Shower chair without back ADL Screening (condition at time of admission) Patient's cognitive ability adequate to safely complete daily activities?: Yes Is the patient deaf or have difficulty hearing?: No Does the patient have difficulty seeing, even when wearing  glasses/contacts?: No Does the patient have difficulty concentrating, remembering, or making decisions?: No Patient able to express need for assistance with ADLs?: Yes Does the patient have difficulty dressing or bathing?: No Independently performs ADLs?: Yes (appropriate for developmental age) Communication: Independent Dressing (OT): Independent Grooming: Independent Feeding: Independent Bathing: Needs assistance Is this a change from baseline?: Pre-admission baseline Toileting: Independent In/Out Bed: Independent Walks in Home: Independent with device (comment) Does the patient have difficulty walking or climbing stairs?: No Weakness of Legs: None Weakness of Arms/Hands: None  Permission Sought/Granted Permission sought to share information with : Family Supports Permission granted to share information with : Yes, Release of Information Signed  Share Information with NAME: [place] granted to share info w Relationship: daughter & son     Emotional Assessment Appearance:: Appears stated age     Orientation: : Oriented to Place, Oriented to Self, Oriented to  Time, Oriented to Situation Alcohol / Substance Use: Not Applicable Psych Involvement: No (comment)  Admission diagnosis:  Closed left hip fracture ([place]) [S72.002A] Fall, initial encounter [W19.XXXA] Closed displaced subtrochanteric fracture of right femur, initial encounter ([place]) [S72.21XA] 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] Patient Active Problem List   Diagnosis Date Noted   Acute postoperative anemia due to expected blood loss 09/27/2021   Delirium due to multiple etiologies, persistent, hypoactive 09/27/2021   Hypertensive urgency 09/26/2021   Diabetes mellitus without complication ([place])  Chronic kidney disease, stage 3a (HCC)    Protein-calorie malnutrition, severe (HCC)    Fracture of femoral shaft, right, closed ([place])    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[place])  08/21/2021   Subdural hematoma (HCC) 05/18/2021   Hypoalbuminemia 04/27/2021   Vaginal bleeding 04/27/2021   Hepatic cirrhosis ([place]) 04/27/2021   Hyperbilirubinemia 04/27/2021   IDA (iron deficiency anemia) 02/16/2021   Gastroesophageal reflux disease 11/10/2020   Lower extremity edema 10/14/2020   Elevated LFTs 10/12/2020   Loss of weight 10/12/2020   Anasarca 09/04/2020   Edema 08/13/2020   Multiple myeloma ([place]) 08/13/2020   Chemotherapy induced nausea and vomiting 07/27/2020   Hypokalemia 07/27/2020   Goals of care, counseling/discussion 07/20/2020   Microcytic anemia 07/06/2020   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[place]) 07/06/2020   Abnormal EKG 07/01/2020   Essential hypertension 07/01/2020   Hyperlipidemia associated with type 2 diabetes mellitus ([place]) 07/01/2020   Encounter for antineoplastic chemotherapy 06/17/2020   Transaminitis 06/17/2020   Hyperglycemia 06/17/2020   Amyloidosis ([place]) 05/20/2020   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Phone: [phone]: [phone] [phone]- [place] [address] [facility] [place]Phone: [phone]: [phone] [id number] [place] [address]: [phone]: [phone]   Social Determinants of Health (SDOH) Interventions    Readmission Risk Interventions     No data to display

## 2021-09-28 NOTE — Progress Notes (Signed)
Nutrition Follow-up  DOCUMENTATION CODES:   Non-severe (moderate) malnutrition in context of chronic illness  INTERVENTION:   -D/c Glucerna Shake po TID, each supplement provides 220 kcal and 10 grams of protein  -Ensure Enlive po TID, each supplement provides 350 kcal and 20 grams of protein -MVI with minerals daily  NUTRITION DIAGNOSIS:   Moderate Malnutrition related to chronic illness (dementia) as evidenced by moderate fat depletion, moderate muscle depletion.  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  6/13- s/p INTRAMEDULLARY (IM) NAIL INTERTROCHANTRIC  Reviewed I/O's: +400 ml x 24 hours and +800 ml since admission  Pt sleeping in recliner chair at time of visit. No meal completion data documented, however, pt only consumed a few bites of sausage and pancakes for breakfast. Pt is consuming Glucerna supplements.    Reviewed wt hx; wt has been stable over the past 3 months.   Pt with poor oral intake and would benefit from nutrient dense supplement. One Ensure Enlive supplement provides 350 kcals, 20 grams protein, and 44-45 grams of carbohydrate vs one Glucerna shake supplement, which provides 220 kcals, 10 grams of protein, and 26 grams of carbohydrate. Given pt's hx of DM, RD will reassess adequacy of PO intake, CBGS, and adjust supplement regimen as appropriate at follow-up.    Medications reviewed and include colace, ferrous sulfate, potassium chloride, senokot, and 0.9% sodium chloride infusion @ 125 ml/hr.   Labs reviewed: CBGS: [id number] (inpatient orders for glycemic control are 0-15 units insulin  aspart TID with meals and 0-5 units insulin aspart daily at bedtime).    NUTRITION - FOCUSED PHYSICAL EXAM:  Flowsheet Row Most Recent Value  Orbital Region Moderate depletion  Upper Arm Region Moderate depletion  Thoracic and Lumbar Region Moderate depletion  Buccal Region Moderate depletion  Temple Region Moderate depletion  Clavicle Bone Region Moderate depletion  Clavicle and Acromion Bone Region Moderate depletion  Scapular Bone Region Moderate depletion  Dorsal Hand Moderate depletion  Patellar Region Moderate depletion  Anterior Thigh Region Moderate depletion  Posterior Calf Region Moderate depletion  Edema (RD Assessment) None  Hair Reviewed  Eyes Reviewed  Mouth Reviewed  Skin Reviewed  Nails Reviewed       Diet Order:   Diet Order             Diet regular Room service appropriate? Yes; Fluid consistency: Thin  Diet effective now                   EDUCATION NEEDS:   Not appropriate for education at this time  Skin:  Skin Assessment: Skin Integrity Issues: Skin Integrity Issues:: Incisions Incisions: closed rt hip, rt thigh  Last BM:  09/26/21  Height:   Ht Readings from Last 1 Encounters:  09/26/21 5' 3.5" (1.613 m)    Weight:   Wt Readings from Last 1 Encounters:  09/26/21 55.3 kg    Ideal Body Weight:  53.4 kg  BMI:  Body mass index is 21.27 kg/m.  Estimated Nutritional Needs:   Kcal:  1750-1950  Protein:  95-110 grams  Fluid:  > 1.7 L  Loistine Chance, RD, LDN, [place] Registered Dietitian II Certified Diabetes Care and Education Specialist Please refer to [facility] for RD and/or RD on-call/weekend/after hours pager

## 2021-09-28 NOTE — Assessment & Plan Note (Addendum)
Creatinine on 6/13 normal and since then has been trending upward.  Possibly from blood loss.  Peaked at 1.99 on 6/15 and after IV fluids, has slowly improved and by 6/17 down to 1.0.  Stopped IV fluids after several more hours due to patient's complaints of urination.

## 2021-09-28 NOTE — Assessment & Plan Note (Signed)
Nutrition Status: Nutrition Problem: Moderate Malnutrition Etiology: chronic illness (dementia) Signs/Symptoms: moderate fat depletion, moderate muscle depletion Interventions: Ensure Enlive (each supplement provides 350kcal and 20 grams of protein), MVI

## 2021-09-29 DIAGNOSIS — D62 Acute posthemorrhagic anemia: Secondary | ICD-10-CM | POA: Diagnosis not present

## 2021-09-29 DIAGNOSIS — N1831 Chronic kidney disease, stage 3a: Secondary | ICD-10-CM | POA: Diagnosis not present

## 2021-09-29 DIAGNOSIS — S72321A Displaced transverse fracture of shaft of right femur, initial encounter for closed fracture: Secondary | ICD-10-CM | POA: Diagnosis not present

## 2021-09-29 DIAGNOSIS — F05 Delirium due to known physiological condition: Secondary | ICD-10-CM | POA: Diagnosis not present

## 2021-09-29 LAB — CBC
HCT: 21.5 % — ABNORMAL LOW (ref 36.0–46.0)
Hemoglobin: 7.4 g/dL — ABNORMAL LOW (ref 12.0–15.0)
MCH: 27.2 pg (ref 26.0–34.0)
MCHC: 34.4 g/dL (ref 30.0–36.0)
MCV: 79 fL — ABNORMAL LOW (ref 80.0–100.0)
Platelets: 297 10*3/uL (ref 150–400)
RBC: 2.72 MIL/uL — ABNORMAL LOW (ref 3.87–5.11)
RDW: 20.7 % — ABNORMAL HIGH (ref 11.5–15.5)
WBC: 13.1 10*3/uL — ABNORMAL HIGH (ref 4.0–10.5)
nRBC: 0 % (ref 0.0–0.2)

## 2021-09-29 LAB — BASIC METABOLIC PANEL
Anion gap: 5 (ref 5–15)
BUN: 41 mg/dL — ABNORMAL HIGH (ref 8–23)
CO2: 22 mmol/L (ref 22–32)
Calcium: 8.3 mg/dL — ABNORMAL LOW (ref 8.9–10.3)
Chloride: 114 mmol/L — ABNORMAL HIGH (ref 98–111)
Creatinine, Ser: 1.49 mg/dL — ABNORMAL HIGH (ref 0.44–1.00)
GFR, Estimated: 36 mL/min — ABNORMAL LOW (ref >60)
Glucose, Bld: 89 mg/dL (ref 70–99)
Potassium: 4.6 mmol/L (ref 3.5–5.1)
Sodium: 141 mmol/L (ref 135–145)

## 2021-09-29 LAB — GLUCOSE, CAPILLARY
Glucose-Capillary: 111 mg/dL — ABNORMAL HIGH (ref 70–99)
Glucose-Capillary: 218 mg/dL — ABNORMAL HIGH (ref 70–99)
Glucose-Capillary: 157 mg/dL — ABNORMAL HIGH (ref 70–99)
Glucose-Capillary: 126 mg/dL — ABNORMAL HIGH (ref 70–99)

## 2021-09-29 NOTE — Progress Notes (Signed)
[facility]) Hospital Liaison note:  This is a pending outpatient-based Palliative Care patient. Will continue to follow for disposition.  Please call with any outpatient palliative questions or concerns.  Thank you, Lorelee Market, LPN [facility] Liaison [phone]

## 2021-09-29 NOTE — Progress Notes (Signed)
Physical Therapy Treatment Patient Details Name: Nancy Rodriguez MRN: [id number] DOB: 21-Aug-1945 Today's Date: 09/29/2021   History of Present Illness Nancy Rodriguez is a 59yoF who comes [facility] on 09[facility]26[facility]21 s/p fall, subtrochanteric femur fracture seen, taken to OR for IM fixation c Dr. Mack Guise on 6/13. Pt inititally in ED on 6/6 after fall with subsequent thigh pain upon weightbearing, tripped over a stationary bike at home, also hit her head and had subsequent AMS. PMH: CHF,RA, amyloidosis, , HTN, DM2, CKD3a. protein-calorie malnutrition. Postoperatively WBAT on Rt. Pt uses either a SPC or RW at baseline. Postop Hb down from 10.8 to 7.4, received some blood. Per DTR pt was AMB ad lib quite well, taking part in cardiac rehab, baseline mentation WNL.    PT Comments    Pt seen this am, daughter at bedside, pt up in chair and agreed to PT.  AAROM R LE in sitting prior to mobility. Pt required MaxA to transition to standing from recliner. Once standing pt practiced weight shifting to Right side prior to ambulating. Pt with decreased weight acceptance through right initially, able to progress tolerance and complete 56f forward with MinA for balance. Pt left sitting comfortably in chair with daughter present. Good progression noted today. Will reassess in pm.   Recommendations for follow up therapy are one component of a multi-disciplinary discharge planning process, led by the attending physician.  Recommendations may be updated based on patient status, additional functional criteria and insurance authorization.  Follow Up Recommendations  Skilled nursing-short term rehab (<3 hours/day)     Assistance Recommended at Discharge Frequent or constant Supervision/Assistance  Patient can return home with the following A lot of help with walking and/or transfers;A lot of help with bathing/dressing/bathroom;Assist for transportation;Help with stairs or ramp for entrance   Equipment Recommendations  None  recommended by PT    Recommendations for Other Services       Precautions / Restrictions Precautions Precautions: Fall Restrictions Weight Bearing Restrictions: No RLE Weight Bearing: Weight bearing as tolerated     Mobility  Bed Mobility               General bed mobility comments: received/left in recliner    Transfers Overall transfer level: Needs assistance Equipment used: Rolling walker (2 wheels) Transfers: Sit to/from Stand Sit to Stand: Max assist           General transfer comment: cues for proper hand placement    Ambulation/Gait Ambulation/Gait assistance: Min assist Gait Distance (Feet): 5 Feet Assistive device: Rolling walker (2 wheels) Gait Pattern/deviations: Step-to pattern, Ataxic, Decreased weight shift to right Gait velocity: decreased     General Gait Details: Max verbal and tactile cues for technique, weight shifting, and use of RW   Stairs             Wheelchair Mobility    Modified Rankin (Stroke Patients Only)       Balance                                            Cognition Arousal/Alertness: Awake/alert Behavior During Therapy: WFL for tasks assessed/performed Overall Cognitive Status: Within Functional Limits for tasks assessed                                 General Comments: Much more  awake/alert this am        Exercises General Exercises - Lower Extremity Ankle Circles/Pumps: Both, 10 reps, AROM Long Arc Quad: AAROM, Right, 10 reps    General Comments        Pertinent Vitals/Pain Pain Assessment Pain Assessment: 0-10 Pain Score: 4  Pain Location: RLE Pain Descriptors / Indicators: Grimacing, Discomfort Pain Intervention(s): Monitored during session    Home Living                          Prior Function            PT Goals (current goals can now be found in the care plan section) Acute Rehab PT Goals Patient Stated Goal: regain baseline  mobility/mentation Progress towards PT goals: Progressing toward goals    Frequency    7X/week      PT Plan Current plan remains appropriate    Co-evaluation              AM-PAC PT "6 Clicks" Mobility   Outcome Measure  Help needed turning from your back to your side while in a flat bed without using bedrails?: A Lot Help needed moving from lying on your back to sitting on the side of a flat bed without using bedrails?: A Lot Help needed moving to and from a bed to a chair (including a wheelchair)?: A Lot Help needed standing up from a chair using your arms (e.g., wheelchair or bedside chair)?: A Lot Help needed to walk in hospital room?: A Lot Help needed climbing 3-5 steps with a railing? : Total 6 Click Score: 11    End of Session Equipment Utilized During Treatment: Gait belt Activity Tolerance: Patient tolerated treatment well Patient left: in chair;with family/visitor present;with call bell/phone within reach;with chair alarm set Nurse Communication: Mobility status PT Visit Diagnosis: Difficulty in walking, not elsewhere classified (R26.2);Other abnormalities of gait and mobility (R26.89);Other symptoms and signs involving the nervous system (R29.898);Repeated falls (R29.6)     Time: 0940-1010 PT Time Calculation (min) (ACUTE ONLY): 30 min  Charges:  $Gait Training: 8-22 mins $Therapeutic Exercise: 8-22 mins                    Nancy Rodriguez, PTA    Nancy Rodriguez 09/29/2021, 2:49 PM

## 2021-09-29 NOTE — Progress Notes (Signed)
Physical Therapy Treatment Patient Details Name: Nancy Rodriguez MRN: [id number] DOB: [birth year] Today's Date: 09/29/2021   History of Present Illness Cyndi Montejano is a 72yoF who comes to [facility] on 09/26/21 s/p fall, subtrochanteric femur fracture seen, taken to OR for IM fixation c Dr. Mack Guise on 6/13. Pt inititally in ED on 6/6 after fall with subsequent thigh pain upon weightbearing, tripped over a stationary bike at home, also hit her head and had subsequent AMS. PMH: CHF,RA, amyloidosis, , HTN, DM2, CKD3a. protein-calorie malnutrition. Postoperatively WBAT on Rt. Pt uses either a SPC or RW at baseline. Postop Hb down from 10.8 to 7.4, received some blood. Per DTR pt was AMB ad lib quite well, taking part in cardiac rehab, baseline mentation WNL.    PT Comments    Pt seen again this pm to progress mobility since pt's cognition and tolerance is improving. Pt required Min/ModA for supine to sit, good sitting balance edge of bed, no dizziness noted. Pt continues to require MaxA to complete sit to stand at [facility]. Gait training x 5 feet with MinA for weight shifting and balance. Pt continues to have difficulty accepting weight through R LE, however remains very motivated to return to prior level of function and has very supportive family.   Recommendations for follow up therapy are one component of a multi-disciplinary discharge planning process, led by the attending physician.  Recommendations may be updated based on patient status, additional functional criteria and insurance authorization.  Follow Up Recommendations  Skilled nursing-short term rehab (<3 hours/day)     Assistance Recommended at Discharge Frequent or constant Supervision/Assistance  Patient can return home with the following A lot of help with walking and/or transfers;A lot of help with bathing/dressing/bathroom;Assist for transportation;Help with stairs or ramp for entrance   Equipment Recommendations  None recommended by PT     Recommendations for Other Services       Precautions / Restrictions Precautions Precautions: Fall Restrictions Weight Bearing Restrictions: No RLE Weight Bearing: Weight bearing as tolerated     Mobility  Bed Mobility Overal bed mobility: Needs Assistance Bed Mobility: Supine to Sit     Supine to sit: Mod assist, HOB elevated     General bed mobility comments: received/left in recliner    Transfers Overall transfer level: Needs assistance Equipment used: Rolling walker (2 wheels) Transfers: Sit to/from Stand Sit to Stand: Max assist           General transfer comment: cues for proper hand placement    Ambulation/Gait Ambulation/Gait assistance: Min assist Gait Distance (Feet): 5 Feet Assistive device: Rolling walker (2 wheels) Gait Pattern/deviations: Step-to pattern, Ataxic, Decreased weight shift to right, Narrow base of support Gait velocity: decreased     General Gait Details: Max verbal and tactile cues for technique, weight shifting, and use of RW   Stairs             Wheelchair Mobility    Modified Rankin (Stroke Patients Only)       Balance Overall balance assessment: Needs assistance Sitting-balance support: No upper extremity supported, Feet supported Sitting balance-Leahy Scale: Good Sitting balance - Comments: pt able to scoot self to edge of bed   Standing balance support: Bilateral upper extremity supported Standing balance-Leahy Scale: Poor                              Cognition Arousal/Alertness: Awake/alert Behavior During Therapy: WFL for tasks assessed/performed Overall Cognitive  Status: Within Functional Limits for tasks assessed                                 General Comments: Much more awake/alert this am        Exercises General Exercises - Lower Extremity Ankle Circles/Pumps: Both, 10 reps, AROM Long Arc Quad: AAROM, Right, 10 reps    General Comments        Pertinent  Vitals/Pain Pain Assessment Pain Assessment: 0-10 Pain Score: 3  Pain Location: RLE Pain Descriptors / Indicators: Grimacing, Discomfort Pain Intervention(s): Monitored during session, Ice applied    Home Living                          Prior Function            PT Goals (current goals can now be found in the care plan section) Acute Rehab PT Goals Patient Stated Goal: regain baseline mobility/mentation Progress towards PT goals: Progressing toward goals    Frequency    7X/week      PT Plan Current plan remains appropriate    Co-evaluation              AM-PAC PT "6 Clicks" Mobility   Outcome Measure  Help needed turning from your back to your side while in a flat bed without using bedrails?: A Lot Help needed moving from lying on your back to sitting on the side of a flat bed without using bedrails?: A Lot Help needed moving to and from a bed to a chair (including a wheelchair)?: A Lot Help needed standing up from a chair using your arms (e.g., wheelchair or bedside chair)?: A Lot Help needed to walk in hospital room?: A Lot Help needed climbing 3-5 steps with a railing? : Total 6 Click Score: 11    End of Session Equipment Utilized During Treatment: Gait belt Activity Tolerance: Patient tolerated treatment well Patient left: in chair;with family/visitor present;with call bell/phone within reach;with chair alarm set Nurse Communication: Mobility status PT Visit Diagnosis: Difficulty in walking, not elsewhere classified (R26.2);Other abnormalities of gait and mobility (R26.89);Other symptoms and signs involving the nervous system (R29.898);Repeated falls (R29.6)     Time: 9233-0076 PT Time Calculation (min) (ACUTE ONLY): 25 min  Charges:  $Gait Training: 8-22 mins $Therapeutic Exercise: 8-22 mins                    [name], PTA    [name] 09/29/2021, 4:06 PM

## 2021-09-29 NOTE — Progress Notes (Signed)
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  No loss of consciousness.  In the emergency room, CT scan of the head unremarkable, but she was found to have a right displaced subtrochanteric proximal femur fracture.  Patient seen by orthopedic surgery and following admission to the hospitalist service, patient was taken for an IM nail.  Postop patient has been doing well.  Noted to have some very mild confusion although not severe.  Patient did have a noticeable drop in her hemoglobin from 10.8 on admission to 7.4 on postop day 1.  She was transfused 1 unit packed red blood cells.  Assessment and Plan: Assessment and Plan: * Fracture of femoral shaft, right, closed ([place]) Status post IM nail by orthopedic surgery on 6/13.  AKI (acute kidney injury) ([place]) Creatinine on 6/13 normal and since then has been trending upward.  Possibly from blood loss.  Peaked at 1.99 on 6/15 and after IV fluids, creatinine improved to 1.49.  Continue IV fluids  Acute postoperative anemia due to expected blood loss Secondary to surgery.  Patient does not show signs of active bleeding.  Transfused 1 unit packed red blood cells 6/14.  With IV fluids, hemoglobin has slightly dropped further (likely from hemoconcentration and I expect that once she is fully hydrated, she will need another unit of packed red blood cells)  Hypertensive urgency BP 180/82.  Blood pressure much improved and actually soft at times since then, likely from blood  loss Hydralazine IV as needed while n.p.o.  Chronic kidney disease, stage 3a ([place]) Creatinine 1.12 on 6/5.  Hemoglobin on 6/14 at 1.27, likely some acute kidney injury secondary to blood loss anemia and should improve with blood transfusion.  Continue to follow.  Protein-calorie malnutrition, severe (HCC) Nutrition Status: Nutrition Problem: Increased nutrient needs Etiology: hip fracture Signs/Symptoms: estimated needs   Patient has only had preliminary nutritionist evaluation prior to surgery.  Follow-up planned  Delirium due to multiple etiologies, persistent, hypoactive Suspect secondary to anesthesia, medication for pain and sleep deprivation.  Patient does not have a previous history of dementia.  Remains overall alert and oriented.  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  Last seen by Dr. Tasia Catchings on 6/5 Most recent labs from 6/5 showing AST 47, ALT 30, alk phos 898 with total bilirubin of 4.7  Preoperative clearance-resolved as of 09/27/2021 Preoperative clearance is pending Blood work including CMP and CBC pending at the time of admission Chest x-ray with no active disease.  EKG pending Patient had a cardiac work-up that included cardiac MRI and echocardiogram in May 2022 that showed no evidence of amyloid.  EF was 71% Patient ambulates at baseline but with high fall risk so  will benefit to early repair. Low to low moderate risk of cardiopulmonary events Overall poor prognostic outlook based on poor nutritional status and chronic medical  conditions        Body mass index is 21.27 kg/m.  Nutrition Problem: Moderate Malnutrition Etiology: chronic illness (dementia)     Consultants: Orthopedic surgery  Procedures: Status post IM nail 6/13 1 unit packed red blood cells transfused 6/14  Antimicrobials: Preop Ancef  Code Status: Full code   Subjective: Occasional leg pain.  States she gets a little confused at night  Objective: Vital signs were reviewed and unremarkable. Vitals:   09/29/21 0322 09/29/21 0823  BP: 132/72 (!) 147/71  Pulse: (!) 101 85  Resp: 16 16  Temp: 98.2 F (36.8 C) (!) 97.1 F (36.2 C)  SpO2: 96% 99%    Intake/Output Summary (Last 24 hours) at 09/29/2021 1235 Last data filed at 09/29/2021 0800 Gross per 24 hour  Intake 120 ml  Output 300 ml  Net -180 ml    Filed Weights   09/26/21 0147 09/26/21 1237  Weight: 55.3 kg 55.3 kg   Body mass index is 21.27 kg/m.  Exam:  General: Alert and oriented x3, no acute distress, fatigued HEENT: Normocephalic and atraumatic, mucous membranes are slightly dry Cardiovascular: Regular rate and rhythm, S1-S2 Respiratory: Clear to auscultation bilaterally Abdomen: Soft, nontender, nondistended, positive bowel sounds Musculoskeletal: No clubbing or cyanosis or edema Skin: No skin breaks, tears or lesions other than surgical site Psychiatry: Appropriate, no evidence of psychoses Neurology: No focal deficits  Data Reviewed: Noted drop in hemoglobin to 7.4 and creatinine improved to 1.49 Disposition:  Status is: Inpatient Remains inpatient appropriate because: Patient will need skilled nursing Improvement in creatinine, stabilization of hemoglobin    Anticipated discharge date: 6/19   Family Communication: Daughter at the bedside DVT Prophylaxis: heparin injection 5,000 Units Start: 09/29/21 0600 SCDs Start: 09/26/21 1843 Place TED hose Start: 09/26/21 1843    Author: Annita Brod ,MD 09/29/2021 12:35 PM  To reach  On-call, see care teams to locate the attending and reach out via [email or web address]. Between 7PM-7AM, please contact night-coverage If you still have difficulty reaching the attending provider, please page the [facility] (Director on Call) for [facility] on amion for assistance.

## 2021-09-29 NOTE — TOC Progression Note (Signed)
Transition of Care [facility]) - Progression Note    Patient Details  Name: Nancy Rodriguez MRN: [id number] Date of Birth: 01-Aug-1945  Transition of Care [facility]) CM/SW Lind, LCSW Phone Number: 09/29/2021, 11:06 AM  Clinical Narrative:   Patient has bed offers at [facility], [facility], and [facility]. CSW left VM for son [name] requesting return call to present offers.     Expected Discharge Plan: [place] Barriers to Discharge: Continued Medical Work up  Expected Discharge Plan and Services Expected Discharge Plan: [place] In-house Referral: Clinical Social Work   Post Acute Care Choice: [place] Living arrangements for the past 2 months: Single Family Home                                       Social Determinants of Health (SDOH) Interventions    Readmission Risk Interventions     No data to display

## 2021-09-29 NOTE — Progress Notes (Signed)
Subjective: 3 Days Post-Op Procedure(s) (LRB): INTRAMEDULLARY (IM) NAIL INTERTROCHANTRIC (Right)   Patient seen and doing well.  Dressing dry.  CSM good.  Hgb 8.0.   Patient reports pain as mild.  Objective:   VITALS:   Vitals:   09/29/21 0322 09/29/21 0823  BP: 132/72 (!) 147/71  Pulse: (!) 101 85  Resp: 16 16  Temp: 98.2 F (36.8 C) (!) 97.1 F (36.2 C)  SpO2: 96% 99%    Incision: dressing C/D/I  LABS Recent Labs    09/27/21 0435 09/27/21 1758 09/28/21 0328 09/29/21 0328  HGB 7.4* 8.8* 8.0* 7.4*  HCT 21.2* 25.5* 22.3* 21.5*  WBC 11.3*  --  13.9* 13.1*  PLT 333  --  277 297    Recent Labs    09/27/21 0435 09/28/21 0328 09/29/21 0328  NA 139 140 141  K 4.4 4.9 4.6  BUN 30* 45* 41*  CREATININE 1.27* 1.99* 1.49*  GLUCOSE 196* 168* 89    No results for input(s): "LABPT", "INR" in the last 72 hours.   Assessment/Plan: 3 Days Post-Op Procedure(s) (LRB): INTRAMEDULLARY (IM) NAIL INTERTROCHANTRIC (Right)   Advance diet Up with therapy

## 2021-09-29 NOTE — Care Management Important Message (Signed)
Important Message  Patient Details  Name: Nancy Rodriguez MRN: [id number] Date of Birth: [birth year]   Medicare Important Message Given:  Other (see comment)  Left a message [phone]) for the patients son to call me at his convenience to review the Important Message from Medicare. Will await a return call.  Juliann Pulse A [name] 09/29/2021, 2:39 PM

## 2021-09-29 NOTE — Progress Notes (Signed)
Subjective:  POD #3 s/p IM fixation for right subtrochanteric hip fracture.   Patient reports right hip pain as mild but improving.  Her left wrist and hand pain today is more localized to the fingers and reports numbness to the fingers.  She does not localize pain to the wrist today.  She has been told she has had some neck troubles in the past.  Objective:   VITALS:   Vitals:   09/28/21 1554 09/28/21 1935 09/28/21 2336 09/29/21 0322  BP: (!) 113/55 120/64 127/61 132/72  Pulse: 88 93 91 (!) 101  Resp: '15 16 15 16  '$ Temp: 98.3 F (36.8 C) 98.5 F (36.9 C) 98.3 F (36.8 C) 98.2 F (36.8 C)  TempSrc: Oral     SpO2: 100% 94% 97% 96%  Weight:      Height:        PHYSICAL EXAM: Right lower extremity Neurovascular intact Sensation intact distally Intact pulses distally Dorsiflexion/Plantar flexion intact Incision: scant drainage No cellulitis present Compartment soft  Left hand and wrist exam Patient is freely moving and utilizing the left hand and wrist in the bed utilizing her cell phone with this.  She localizes pain and numbness to the digits.  She is nontender through palpation of the distal radius, ulna, snuffbox, metacarpals and carpus as a whole.  She makes a full composite fist.  She is nontender through the digits.  Sensation is grossly intact with palpable radial pulse. LABS  Results for orders placed or performed during the hospital encounter of 09/26/21 (from the past 24 hour(s))  Glucose, capillary     Status: Abnormal   Collection Time: 09/28/21  8:02 AM  Result Value Ref Range   Glucose-Capillary 147 (H) 70 - 99 mg/dL  Glucose, capillary     Status: Abnormal   Collection Time: 09/28/21 12:26 PM  Result Value Ref Range   Glucose-Capillary 215 (H) 70 - 99 mg/dL  Glucose, capillary     Status: Abnormal   Collection Time: 09/28/21  4:56 PM  Result Value Ref Range   Glucose-Capillary 153 (H) 70 - 99 mg/dL  Glucose, capillary     Status: Abnormal   Collection  Time: 09/28/21  8:48 PM  Result Value Ref Range   Glucose-Capillary 124 (H) 70 - 99 mg/dL  Glucose, capillary     Status: None   Collection Time: 09/28/21  8:50 PM  Result Value Ref Range   Glucose-Capillary 97 70 - 99 mg/dL  Basic metabolic panel     Status: Abnormal   Collection Time: 09/29/21  3:28 AM  Result Value Ref Range   Sodium 141 135 - 145 mmol/L   Potassium 4.6 3.5 - 5.1 mmol/L   Chloride 114 (H) 98 - 111 mmol/L   CO2 22 22 - 32 mmol/L   Glucose, Bld 89 70 - 99 mg/dL   BUN 41 (H) 8 - 23 mg/dL   Creatinine, Ser 1.49 (H) 0.44 - 1.00 mg/dL   Calcium 8.3 (L) 8.9 - 10.3 mg/dL   GFR, Estimated 36 (L) >60 mL/min   Anion gap 5 5 - 15  CBC     Status: Abnormal   Collection Time: 09/29/21  3:28 AM  Result Value Ref Range   WBC 13.1 (H) 4.0 - 10.5 K/uL   RBC 2.72 (L) 3.87 - 5.11 MIL/uL   Hemoglobin 7.4 (L) 12.0 - 15.0 g/dL   HCT 21.5 (L) 36.0 - 46.0 %   MCV 79.0 (L) 80.0 - 100.0 fL  MCH 27.2 26.0 - 34.0 pg   MCHC 34.4 30.0 - 36.0 g/dL   RDW 20.7 (H) 11.5 - 15.5 %   Platelets 297 150 - 400 K/uL   nRBC 0.0 0.0 - 0.2 %    DG Wrist 2 Views Left  Result Date: 09/27/2021 CLINICAL DATA:  Left wrist pain after fall yesterday. EXAM: LEFT WRIST - 2 VIEW COMPARISON:  None available FINDINGS: There is 1 mm ulnar negative variance. Moderate to severe thumb carpometacarpal and moderate triscaphe joint space narrowing and peripheral osteophytosis. Mild-to-moderate second carpometacarpal joint space narrowing and peripheral osteophytosis. Moderate distal radial styloid degenerative spur contacts a degenerative spur at the distal lateral aspect of the scaphoid. Bone overlap in different regions of the scaphoid on frontal and lateral views limits evaluation for an acute fracture. This includes within the region of the distal lateral aspect of the scaphoid where a prominent degenerative spur overlies the distal radial styloid. There is decreased scaphoid bone mineralization in this region with  multiple lucent likely bone nutrient foramina. No definite acute fracture is visualized. IMPRESSION: 1. 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 2. Moderate degenerative changes of the distal radial styloid and adjacent distal lateral aspect of the scaphoid. 3. No definite acute fracture is seen. If there is there is snuffbox tenderness and clinical concern for a radiographically occult fracture, consider splinting and follow-up radiographs in 10 days. Electronically Signed   By: Yvonne Kendall M.D.   On: 09/27/2021 11:00    Assessment/Plan: 3 Days Post-Op   Principal Problem:   Fracture of femoral shaft, right, closed ([place]) Active Problems:   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[place])   Hypertensive urgency   Diabetes mellitus without complication (HCC)   Chronic kidney disease, stage 3a ([place])   Protein-calorie malnutrition, severe ([place])   Acute postoperative anemia due to expected blood loss   Delirium due to multiple etiologies, persistent, hypoactive   Malnutrition of moderate degree   AKI (acute kidney injury) ([place])  Nancy Rodriguez is status post right hip IM nail placement postop day 3.  Doing well from an orthopedic standpoint.  X-ray of the left wrist was obtained by Dr. Mack Guise, it shows some degenerative changes.  Based on her physical exam I doubt fracture at this point.  She localizes all of her symptoms to the digits, this seems more in line with a cervical radiculopathy or carpal tunnel syndrome.  We will follow this up in the office at her postop visit.  The patient will continue to work with physical therapy, be weightbearing as tolerated.  Plan for skilled nursing facility discharge when medically clear.  DVT prophylaxis with Lovenox, would recommend Lovenox while in facility until follow-up in office.  Patient will have follow-up in the office 10 to 14 days after surgery/discharge from hospital.  Roland Rack , PA-c 09/29/2021, 7:23 AM

## 2021-09-30 DIAGNOSIS — S72321A Displaced transverse fracture of shaft of right femur, initial encounter for closed fracture: Secondary | ICD-10-CM | POA: Diagnosis not present

## 2021-09-30 DIAGNOSIS — F05 Delirium due to known physiological condition: Secondary | ICD-10-CM | POA: Diagnosis not present

## 2021-09-30 DIAGNOSIS — N1831 Chronic kidney disease, stage 3a: Secondary | ICD-10-CM | POA: Diagnosis not present

## 2021-09-30 DIAGNOSIS — D62 Acute posthemorrhagic anemia: Secondary | ICD-10-CM | POA: Diagnosis not present

## 2021-09-30 LAB — GLUCOSE, CAPILLARY
Glucose-Capillary: 114 mg/dL — ABNORMAL HIGH (ref 70–99)
Glucose-Capillary: 187 mg/dL — ABNORMAL HIGH (ref 70–99)
Glucose-Capillary: 132 mg/dL — ABNORMAL HIGH (ref 70–99)
Glucose-Capillary: 100 mg/dL — ABNORMAL HIGH (ref 70–99)

## 2021-09-30 LAB — BASIC METABOLIC PANEL
Anion gap: 6 (ref 5–15)
BUN: 31 mg/dL — ABNORMAL HIGH (ref 8–23)
CO2: 21 mmol/L — ABNORMAL LOW (ref 22–32)
Calcium: 8.3 mg/dL — ABNORMAL LOW (ref 8.9–10.3)
Chloride: 113 mmol/L — ABNORMAL HIGH (ref 98–111)
Creatinine, Ser: 1 mg/dL (ref 0.44–1.00)
GFR, Estimated: 58 mL/min — ABNORMAL LOW (ref >60)
Glucose, Bld: 113 mg/dL — ABNORMAL HIGH (ref 70–99)
Potassium: 4.4 mmol/L (ref 3.5–5.1)
Sodium: 140 mmol/L (ref 135–145)

## 2021-09-30 LAB — CBC
HCT: 22.1 % — ABNORMAL LOW (ref 36.0–46.0)
Hemoglobin: 7.7 g/dL — ABNORMAL LOW (ref 12.0–15.0)
MCH: 27.6 pg (ref 26.0–34.0)
MCHC: 34.8 g/dL (ref 30.0–36.0)
MCV: 79.2 fL — ABNORMAL LOW (ref 80.0–100.0)
Platelets: 329 10*3/uL (ref 150–400)
RBC: 2.79 MIL/uL — ABNORMAL LOW (ref 3.87–5.11)
RDW: 21.2 % — ABNORMAL HIGH (ref 11.5–15.5)
WBC: 12.5 10*3/uL — ABNORMAL HIGH (ref 4.0–10.5)
nRBC: 0 % (ref 0.0–0.2)

## 2021-09-30 MED ORDER — DIPHENHYDRAMINE HCL 25 MG PO CAPS
25.0000 mg | ORAL_CAPSULE | Freq: Every day | ORAL | Status: DC
  Administered 2021-09-30 – 2021-10-02 (×3): 25 mg via ORAL
  Filled 2021-09-30 (×3): qty 1

## 2021-09-30 NOTE — TOC Progression Note (Addendum)
Transition of Care [facility]) - Progression Note    Patient Details  Name: Nancy Rodriguez MRN: [id number] Date of Birth: [birth year]  Transition of Care [facility]) CM/SW [place], LCSW Phone Number: 09/30/2021, 9:33 AM  Clinical Narrative:    CSW attempted calls to patient's daughter and son. Left VM for both requesting a return call so bed offers can be presented.  1:55- Spoke with patient's daughter [name] [phone]. Presented bed offers. [name] stated their top choice is [facility]. If [facility] is unable to accept, then they would be willing to look at current offers. CSW asked Tammy at [facility] to review referral, Tammy said she will review the referral.    Expected Discharge Plan: Skilled Nursing Facility Barriers to Discharge: Continued Medical Work up  Expected Discharge Plan and Services Expected Discharge Plan: [place] In-house Referral: Clinical Social Work   Post Acute Care Choice: [place] Living arrangements for the past 2 months: Single Family Home                                       Social Determinants of Health (SDOH) Interventions    Readmission Risk Interventions     No data to display

## 2021-09-30 NOTE — Plan of Care (Signed)

## 2021-09-30 NOTE — Progress Notes (Signed)
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  No loss of consciousness.  In the emergency room, CT scan of the head unremarkable, but she was found to have a right displaced subtrochanteric proximal femur fracture.  Patient seen by orthopedic surgery and following admission to the hospitalist service, patient was taken for an IM nail.  Postop patient has been doing well.  Noted to have some very mild confusion although not severe.  Patient did have a noticeable drop in her hemoglobin from 10.8 on admission to 7.4 on postop day 1.  She was transfused 1 unit packed red blood cells.  Assessment and Plan: Assessment and Plan: * Fracture of femoral shaft, right, closed ([place]) Status post IM nail by orthopedic surgery on 6/13.  AKI (acute kidney injury) ([place]) Creatinine on 6/13 normal and since then has been trending upward.  Possibly from blood loss.  Peaked at 1.99 on 6/15 and after IV fluids, has slowly improved and by 6/17 down to 1.0.  Stopped IV fluids after several more hours due to patient's complaints of urination.  Acute postoperative anemia due to expected blood loss Secondary to surgery.  Patient does not show signs of active bleeding.  Transfused 1 unit packed red blood cells 6/14.  With IV fluids, hemoglobin had a mild drop secondary to hemoconcentration, but since then has improved on its own.  Today at 7.7.  Hypertensive urgency BP 180/82.  Blood pressure much improved and actually soft at times since  then, likely from blood loss Hydralazine IV as needed while n.p.o.  Chronic kidney disease, stage 3a ([place]) Creatinine 1.12 on 6/5.  Hemoglobin on 6/14 at 1.27, likely some acute kidney injury secondary to blood loss anemia and should improve with blood transfusion.  Continue to follow.  Protein-calorie malnutrition, severe (HCC) Nutrition Status: Nutrition Problem: Increased nutrient needs Etiology: hip fracture Signs/Symptoms: estimated needs   Patient has only had preliminary nutritionist evaluation prior to surgery.  Follow-up planned  Delirium due to multiple etiologies, persistent, hypoactive Suspect secondary to anesthesia, medication for pain and sleep deprivation.  Patient does not have a previous history of dementia.  Remains overall alert and oriented.  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  Last seen by Dr. Tasia Catchings on 6/5 Most recent labs from 6/5 showing AST 47, ALT 30, alk phos 898 with total bilirubin of 4.7  Preoperative clearance-resolved as of 09/27/2021 Preoperative clearance is pending Blood work including CMP and CBC pending at the time of admission Chest x-ray with no active disease.  EKG pending Patient had a cardiac work-up that included cardiac MRI and echocardiogram in May 2022 that showed no evidence of amyloid.  EF was 71% Patient ambulates  at baseline but with high fall risk so will benefit to early repair. Low to low moderate risk of cardiopulmonary events Overall poor prognostic outlook based on poor nutritional status  and chronic medical conditions        Body mass index is 21.27 kg/m.  Nutrition Problem: Moderate Malnutrition Etiology: chronic illness (dementia)     Consultants: Orthopedic surgery  Procedures: Status post IM nail 6/13 1 unit packed red blood cells transfused 6/14  Antimicrobials: Preop Ancef  Code Status: Full code   Subjective: Occasional right leg pain.  Objective: Vital signs were reviewed and unremarkable. Vitals:   09/30/21 0412 09/30/21 0802  BP: 127/65 128/60  Pulse: 95 90  Resp: 16 18  Temp: 98.3 F (36.8 C) 98.3 F (36.8 C)  SpO2: 94% 100%    Intake/Output Summary (Last 24 hours) at 09/30/2021 1457 Last data filed at 09/30/2021 0900 Gross per 24 hour  Intake 1295.16 ml  Output 600 ml  Net 695.16 ml    Filed Weights   09/26/21 0147 09/26/21 1237  Weight: 55.3 kg 55.3 kg   Body mass index is 21.27 kg/m.  Exam:  General: Alert and oriented x3, no acute distress, fatigued HEENT: Normocephalic and atraumatic, mucous membranes are slightly dry Cardiovascular: Regular rate and rhythm, S1-S2 Respiratory: Clear to auscultation bilaterally Abdomen: Soft, nontender, nondistended, positive bowel sounds Musculoskeletal: No clubbing or cyanosis or edema Skin: No skin breaks, tears or lesions other than surgical site Psychiatry: Appropriate, no evidence of psychoses Neurology: No focal deficits  Data Reviewed: Noted improvement in both hemoglobin and creatinine.  Disposition:  Status is: Inpatient Remains inpatient appropriate because: Skilled nursing facility on 6/19    Anticipated discharge date: 6/19   Family Communication: Daughter at the bedside DVT Prophylaxis: heparin injection 5,000 Units Start: 09/29/21 0600 SCDs Start: 09/26/21 1843 Place TED hose Start: 09/26/21 1843    Author: Annita Brod ,MD 09/30/2021 2:57 PM  To reach On-call, see care teams to locate the attending and reach out via [email or web address]. Between  7PM-7AM, please contact night-coverage If you still have difficulty reaching the attending provider, please page the [facility] (Director on Call) for [facility] on amion for assistance.

## 2021-09-30 NOTE — Progress Notes (Signed)
  Subjective:  POD #4 s/p IM fixation for right subtrochanteric hip fracture.   Patient is resting comfortably in bed accompanied by her daughter.  Patient was up to the recliner earlier today.  Objective:   VITALS:   Vitals:   09/29/21 1630 09/29/21 1925 09/30/21 0412 09/30/21 0802  BP: 134/60 (!) 128/57 127/65 128/60  Pulse: 85 77 95 90  Resp: '16 16 16 18  '$ Temp: 98.1 F (36.7 C) 98.1 F (36.7 C) 98.3 F (36.8 C) 98.3 F (36.8 C)  TempSrc:   Oral Oral  SpO2: 100% 99% 94% 100%  Weight:      Height:        PHYSICAL EXAM: Right lower extremity Neurovascular intact Sensation intact distally Intact pulses distally Dorsiflexion/Plantar flexion intact Incision: scant drainage No cellulitis present Compartment soft   LABS  Results for orders placed or performed during the hospital encounter of 09/26/21 (from the past 24 hour(s))  Glucose, capillary     Status: Abnormal   Collection Time: 09/29/21  5:03 PM  Result Value Ref Range   Glucose-Capillary 157 (H) 70 - 99 mg/dL  Glucose, capillary     Status: Abnormal   Collection Time: 09/29/21  9:18 PM  Result Value Ref Range   Glucose-Capillary 126 (H) 70 - 99 mg/dL  Basic metabolic panel     Status: Abnormal   Collection Time: 09/30/21  4:31 AM  Result Value Ref Range   Sodium 140 135 - 145 mmol/L   Potassium 4.4 3.5 - 5.1 mmol/L   Chloride 113 (H) 98 - 111 mmol/L   CO2 21 (L) 22 - 32 mmol/L   Glucose, Bld 113 (H) 70 - 99 mg/dL   BUN 31 (H) 8 - 23 mg/dL   Creatinine, Ser 1.00 0.44 - 1.00 mg/dL   Calcium 8.3 (L) 8.9 - 10.3 mg/dL   GFR, Estimated 58 (L) >60 mL/min   Anion gap 6 5 - 15  CBC     Status: Abnormal   Collection Time: 09/30/21  4:31 AM  Result Value Ref Range   WBC 12.5 (H) 4.0 - 10.5 K/uL   RBC 2.79 (L) 3.87 - 5.11 MIL/uL   Hemoglobin 7.7 (L) 12.0 - 15.0 g/dL   HCT 22.1 (L) 36.0 - 46.0 %   MCV 79.2 (L) 80.0 - 100.0 fL   MCH 27.6 26.0 - 34.0 pg   MCHC 34.8 30.0 - 36.0 g/dL   RDW 21.2 (H) 11.5 - 15.5 %    Platelets 329 150 - 400 K/uL   nRBC 0.0 0.0 - 0.2 %  Glucose, capillary     Status: Abnormal   Collection Time: 09/30/21  7:58 AM  Result Value Ref Range   Glucose-Capillary 114 (H) 70 - 99 mg/dL  Glucose, capillary     Status: Abnormal   Collection Time: 09/30/21 11:04 AM  Result Value Ref Range   Glucose-Capillary 187 (H) 70 - 99 mg/dL    No results found.  Assessment/Plan: 4 Days Post-Op  status post right hip IM nail placement postop day 3 with Dr. Mack Guise.  -Weight-bear as tolerated, current PT recs are SNF. -Continue DVT prophylaxis with Lovenox, would recommend Lovenox while in facility until follow-up in office - Patient will have follow-up in the office 10 to 14 days after surgery/discharge from hospital.  Renee Harder 09/30/2021, 3:55 PM

## 2021-09-30 NOTE — Progress Notes (Signed)
PT Cancellation Note  Patient Details Name: STEVI HOLLINSHEAD MRN: [id number] DOB: 10/23/1945   Cancelled Treatment:     PT attempt. Pt siting in recliner with supportive daughter at bedside. Requested to receive pain medication and author return after lunch. Author will return later this afternoon and PT will continue to follow per current POC.     Willette Pa 09/30/2021, 12:47 PM

## 2021-09-30 NOTE — Progress Notes (Signed)
Physical Therapy Treatment Patient Details Name: Nancy Rodriguez MRN: [id number] DOB: 1945/07/23 Today's Date: 09/30/2021   History of Present Illness Nancy Rodriguez is a 20yoF who [facility] on [facility]21 s/p fall, subtrochanteric femur fracture seen, taken to OR for IM fixation c Dr. Mack Guise on 6/13. Pt inititally in ED on 6/6 after fall with subsequent thigh pain upon weightbearing, tripped over a stationary bike at home, also hit her head and had subsequent AMS. PMH: CHF,RA, amyloidosis, , HTN, DM2, CKD3a. protein-calorie malnutrition. Postoperatively WBAT on Rt. Pt uses either a SPC or RW at baseline. Postop Hb down from 10.8 to 7.4, received some blood. Per DTR pt was AMB ad lib quite well, taking part in cardiac rehab, baseline mentation WNL.    PT Comments    Pt was sitting in recliner upon arriving. Supportive daughter at bedside. Pt is extremely lethargic but agreeable to getting up, requested to return to bed. Pt needs constant vcs for keeping eyes open. She was issued pain med ~ 1 hour prior to session. May need to see pt either further from pain meds given or much closer to time issued in future sessions. Author issued HEP handout but due to pt's inability to stay awake, will need to be reviewed future sessions. Highly recommend DC to SNF to address deficits while maximizing independence with ADLs.    Recommendations for follow up therapy are one component of a multi-disciplinary discharge planning process, led by the attending physician.  Recommendations may be updated based on patient status, additional functional criteria and insurance authorization.  Follow Up Recommendations  Skilled nursing-short term rehab (<3 hours/day)     Assistance Recommended at Discharge Frequent or constant Supervision/Assistance  Patient can return home with the following A lot of help with walking and/or transfers;A lot of help with bathing/dressing/bathroom;Assist for transportation;Help with  stairs or ramp for entrance   Equipment Recommendations  None recommended by PT       Precautions / Restrictions Precautions Precautions: Fall Restrictions Weight Bearing Restrictions: Yes RLE Weight Bearing: Weight bearing as tolerated     Mobility  Bed Mobility Overal bed mobility: Needs Assistance Bed Mobility: Sit to Supine  Sit to supine: Min assist   General bed mobility comments: min assist to progress BLEs into bed fro EOB short sit    Transfers Overall transfer level: Needs assistance Equipment used: Rolling walker (2 wheels) Transfers: Sit to/from Stand Sit to Stand: Min assist, Mod assist    General transfer comment: pt was able to stand form recliner with min-mod asisst of one. Constant vcs for keeping eyes open. Author feels pt is very lethargic from pain meds given ~ 1 hour prior to session.    Ambulation/Gait Ambulation/Gait assistance: Min assist Gait Distance (Feet): 12 Feet Assistive device: Rolling walker (2 wheels) Gait Pattern/deviations: Step-to pattern, Ataxic, Decreased weight shift to right, Narrow base of support Gait velocity: decreased     General Gait Details: pt was able to ambulate however due to lethargy, only minimal distance due to scare of falling asleep in standing    Balance Overall balance assessment: Needs assistance Sitting-balance support: No upper extremity supported, Feet supported Sitting balance-Leahy Scale: Good     Standing balance support: Bilateral upper extremity supported Standing balance-Leahy Scale: Fair       Cognition Arousal/Alertness: Awake/alert Behavior During Therapy: WFL for tasks assessed/performed Overall Cognitive Status: Within Functional Limits for tasks assessed      General Comments: pt is very lethargic. Struggles to keep  eyes open throughout session.           General Comments General comments (skin integrity, edema, etc.): Author gave HEP handoput to pt's daughter. pt was asleep by  the time author returned to room to give HEP.      Pertinent Vitals/Pain Pain Assessment Pain Assessment: 0-10 Pain Score: 4  Breathing: normal Negative Vocalization: none Facial Expression: smiling or inexpressive Body Language: relaxed Consolability: no need to console PAINAD Score: 0 Pain Location: RLE Pain Descriptors / Indicators: Grimacing, Discomfort Pain Intervention(s): Limited activity within patient's tolerance, Monitored during session, Premedicated before session, Repositioned     PT Goals (current goals can now be found in the care plan section) Acute Rehab PT Goals Patient Stated Goal: rehab then home Progress towards PT goals: Progressing toward goals    Frequency    7X/week      PT Plan Current plan remains appropriate       AM-PAC PT "6 Clicks" Mobility   Outcome Measure  Help needed turning from your back to your side while in a flat bed without using bedrails?: A Lot Help needed moving from lying on your back to sitting on the side of a flat bed without using bedrails?: A Lot Help needed moving to and from a bed to a chair (including a wheelchair)?: A Lot Help needed standing up from a chair using your arms (e.g., wheelchair or bedside chair)?: A Lot Help needed to walk in hospital room?: A Lot Help needed climbing 3-5 steps with a railing? : Total 6 Click Score: 11    End of Session   Activity Tolerance: Patient tolerated treatment well Patient left: in bed;with call bell/phone within reach;with bed alarm set;with family/visitor present;with SCD's reapplied Nurse Communication: Mobility status PT Visit Diagnosis: Difficulty in walking, not elsewhere classified (R26.2);Other abnormalities of gait and mobility (R26.89);Other symptoms and signs involving the nervous system (R29.898);Repeated falls (R29.6)     Time: 6811-5726 PT Time Calculation (min) (ACUTE ONLY): 12 min  Charges:  $Gait Training: 8-22 mins                     Julaine Fusi PTA 09/30/21, 4:17 PM

## 2021-10-01 DIAGNOSIS — D62 Acute posthemorrhagic anemia: Secondary | ICD-10-CM | POA: Diagnosis not present

## 2021-10-01 DIAGNOSIS — N1831 Chronic kidney disease, stage 3a: Secondary | ICD-10-CM | POA: Diagnosis not present

## 2021-10-01 DIAGNOSIS — S72321A Displaced transverse fracture of shaft of right femur, initial encounter for closed fracture: Secondary | ICD-10-CM | POA: Diagnosis not present

## 2021-10-01 DIAGNOSIS — F05 Delirium due to known physiological condition: Secondary | ICD-10-CM | POA: Diagnosis not present

## 2021-10-01 LAB — GLUCOSE, CAPILLARY
Glucose-Capillary: 131 mg/dL — ABNORMAL HIGH (ref 70–99)
Glucose-Capillary: 188 mg/dL — ABNORMAL HIGH (ref 70–99)
Glucose-Capillary: 205 mg/dL — ABNORMAL HIGH (ref 70–99)
Glucose-Capillary: 146 mg/dL — ABNORMAL HIGH (ref 70–99)

## 2021-10-01 LAB — CBC
HCT: 21.2 % — ABNORMAL LOW (ref 36.0–46.0)
Hemoglobin: 7.3 g/dL — ABNORMAL LOW (ref 12.0–15.0)
MCH: 27.7 pg (ref 26.0–34.0)
MCHC: 34.4 g/dL (ref 30.0–36.0)
MCV: 80.3 fL (ref 80.0–100.0)
Platelets: 353 10*3/uL (ref 150–400)
RBC: 2.64 MIL/uL — ABNORMAL LOW (ref 3.87–5.11)
RDW: 22.2 % — ABNORMAL HIGH (ref 11.5–15.5)
WBC: 10.7 10*3/uL — ABNORMAL HIGH (ref 4.0–10.5)
nRBC: 0.2 % (ref 0.0–0.2)

## 2021-10-01 LAB — BASIC METABOLIC PANEL
Anion gap: 4 — ABNORMAL LOW (ref 5–15)
BUN: 34 mg/dL — ABNORMAL HIGH (ref 8–23)
CO2: 23 mmol/L (ref 22–32)
Calcium: 8.3 mg/dL — ABNORMAL LOW (ref 8.9–10.3)
Chloride: 114 mmol/L — ABNORMAL HIGH (ref 98–111)
Creatinine, Ser: 1.1 mg/dL — ABNORMAL HIGH (ref 0.44–1.00)
GFR, Estimated: 52 mL/min — ABNORMAL LOW (ref >60)
Glucose, Bld: 163 mg/dL — ABNORMAL HIGH (ref 70–99)
Potassium: 4.6 mmol/L (ref 3.5–5.1)
Sodium: 141 mmol/L (ref 135–145)

## 2021-10-01 MED ORDER — FUROSEMIDE 10 MG/ML IJ SOLN
20.0000 mg | Freq: Once | INTRAMUSCULAR | Status: AC
  Administered 2021-10-01: 20 mg via INTRAVENOUS
  Filled 2021-10-01: qty 4

## 2021-10-01 MED ORDER — SODIUM CHLORIDE 0.9% IV SOLUTION: Freq: Once | INTRAVENOUS | Status: DC

## 2021-10-01 NOTE — Progress Notes (Signed)
Physical Therapy Treatment Patient Details Name: Nancy Rodriguez MRN: [id number] DOB: 19-Mar-1946 Today's Date: 10/01/2021   History of Present Illness Nancy Rodriguez is a 61yoF who comes to [facility] on 09/26/21 s/p fall, subtrochanteric femur fracture seen, taken to OR for IM fixation c Dr. Mack Guise on 6/13. Pt inititally in ED on 6/6 after fall with subsequent thigh pain upon weightbearing, tripped over a stationary bike at home, also hit her head and had subsequent AMS. PMH: CHF,RA, amyloidosis, , HTN, DM2, CKD3a. protein-calorie malnutrition. Postoperatively WBAT on Rt. Pt uses either a SPC or RW at baseline. Postop Hb down from 10.8 to 7.4, received some blood. Per DTR pt was AMB ad lib quite well, taking part in cardiac rehab, baseline mentation WNL.    PT Comments    Pt ready for session.  To EOB with HOB raised and increased time.  She is able to stand and transfer to [facility] with min a x 1 and progress gait to 10' after voiding.  Pt overall improved mobility and decreased assist but remains limited by pain and general weakness.  SNF remains appropriate.   Recommendations for follow up therapy are one component of a multi-disciplinary discharge planning process, led by the attending physician.  Recommendations may be updated based on patient status, additional functional criteria and insurance authorization.  Follow Up Recommendations  Skilled nursing-short term rehab (<3 hours/day)     Assistance Recommended at Discharge Frequent or constant Supervision/Assistance  Patient can return home with the following Assist for transportation;Help with stairs or ramp for entrance;A little help with walking and/or transfers;A little help with bathing/dressing/bathroom;Assistance with cooking/housework   Equipment Recommendations  None recommended by PT    Recommendations for Other Services       Precautions / Restrictions Precautions Precautions: Fall Restrictions Weight Bearing Restrictions:  Yes RLE Weight Bearing: Weight bearing as tolerated     Mobility  Bed Mobility Overal bed mobility: Needs Assistance Bed Mobility: Supine to Sit     Supine to sit: Min assist, HOB elevated     General bed mobility comments: increased time but good effort    Transfers Overall transfer level: Needs assistance Equipment used: Rolling walker (2 wheels) Transfers: Sit to/from Stand Sit to Stand: Min assist   Step pivot transfers: Min assist       General transfer comment: able to stand and transfer well with RW to commode    Ambulation/Gait Ambulation/Gait assistance: Min assist Gait Distance (Feet): 10 Feet Assistive device: Rolling walker (2 wheels) Gait Pattern/deviations: Step-to pattern, Ataxic, Decreased weight shift to right, Narrow base of support Gait velocity: decreased     General Gait Details: slow antalgic gait, limited by fatigue   Stairs             Wheelchair Mobility    Modified Rankin (Stroke Patients Only)       Balance Overall balance assessment: Needs assistance Sitting-balance support: No upper extremity supported, Feet supported Sitting balance-Leahy Scale: Good     Standing balance support: Bilateral upper extremity supported Standing balance-Leahy Scale: Fair                              Cognition Arousal/Alertness: Awake/alert Behavior During Therapy: WFL for tasks assessed/performed Overall Cognitive Status: Within Functional Limits for tasks assessed  Exercises Other Exercises Other Exercises: to commode to void.  cues for self care but able to maintain balance for task with increased time    General Comments        Pertinent Vitals/Pain Pain Assessment Pain Assessment: Faces Faces Pain Scale: Hurts a little bit Pain Descriptors / Indicators: Grimacing, Discomfort Pain Intervention(s): Limited activity within patient's tolerance, Monitored  during session, Repositioned    Home Living                          Prior Function            PT Goals (current goals can now be found in the care plan section) Progress towards PT goals: Progressing toward goals    Frequency    7X/week      PT Plan Current plan remains appropriate    Co-evaluation              AM-PAC PT "6 Clicks" Mobility   Outcome Measure  Help needed turning from your back to your side while in a flat bed without using bedrails?: A Little Help needed moving from lying on your back to sitting on the side of a flat bed without using bedrails?: A Little Help needed moving to and from a bed to a chair (including a wheelchair)?: A Little Help needed standing up from a chair using your arms (e.g., wheelchair or bedside chair)?: A Little Help needed to walk in hospital room?: A Little Help needed climbing 3-5 steps with a railing? : Total 6 Click Score: 16    End of Session Equipment Utilized During Treatment: Gait belt Activity Tolerance: Patient tolerated treatment well Patient left: in chair;with chair alarm set;with call bell/phone within reach;with family/visitor present Nurse Communication: Mobility status PT Visit Diagnosis: Difficulty in walking, not elsewhere classified (R26.2);Other abnormalities of gait and mobility (R26.89);Other symptoms and signs involving the nervous system (R29.898);Repeated falls (R29.6)     Time: 1109-1140 PT Time Calculation (min) (ACUTE ONLY): 31 min  Charges:  $Gait Training: 8-22 mins $Therapeutic Activity: 8-22 mins                   Chesley Noon, PTA 10/01/21, 1:13 PM

## 2021-10-01 NOTE — Progress Notes (Signed)
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  No loss of consciousness.  In the emergency room, CT scan of the head unremarkable, but she was found to have a right displaced subtrochanteric proximal femur fracture.  Patient seen by orthopedic surgery and following admission to the hospitalist service, patient was taken for an IM nail.  Postop patient has been doing well.  Noted to have some very mild confusion although not severe.  Patient did have a noticeable drop in her hemoglobin from 10.8 on admission to 7.4 on postop day 1.  She was transfused 1 unit packed red blood cells.  Hemoglobin initially improved although mild drop over the next few days.  Transfusing additional unit of packed red blood cells today.  Assessment and Plan: Assessment and Plan: * Fracture of femoral shaft, right, closed (HCC) Status post IM nail by orthopedic surgery on 6/13.  AKI (acute kidney injury) (HCC) Creatinine on 6/13 normal and since then has been trending upward.  Possibly from blood loss.  Peaked at 1.99 on 6/15 and after IV fluids, has slowly improved and by 6/17 down to 1.0.  Stopped IV fluids after several more hours due to patient's complaints of urination.  Acute postoperative anemia due to expected blood loss Secondary to surgery.  Patient does not show signs of active bleeding.  Transfused 1 unit packed red blood cells 6/14.  Hemoglobin initially improved although with IV fluids, has had some drop since.  Today at 7.3 so  transfusing 1 more unit packed red blood cells.  Hypertensive urgency BP 180/82.  Blood pressure much improved and actually soft at times since then, likely from blood loss Hydralazine IV as needed while n.p.o.  Chronic kidney disease, stage 3a (HCC) Creatinine 1.12 on 6/5.  Hemoglobin on 6/14 at 1.27, likely some acute kidney injury secondary to blood loss anemia and has improved each time with blood  Protein-calorie malnutrition, severe (HCC) Nutrition Status: Nutrition Problem: Increased nutrient needs Etiology: hip fracture Signs/Symptoms: estimated needs   Patient has only had preliminary nutritionist evaluation prior to surgery.  Follow-up planned  Delirium due to multiple etiologies, persistent, hypoactive Suspect secondary to anesthesia, medication for pain and sleep deprivation.  Patient does not have a previous history of dementia.  Remains overall alert and oriented.  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  Last seen by Dr. Yu on 6/5 Most recent labs from 6/5 showing AST 47, ALT 30, alk phos 898 with total bilirubin of 4.7.  Skilled nursing has informed patient's family to bring her oral chemotherapy pills as they will not be able to supply them during her stay.  Preoperative clearance-resolved as of 09/27/2021 Preoperative clearance is pending Blood   work including CMP and CBC pending at the time of admission Chest x-ray with no active disease.  EKG pending Patient had a cardiac work-up that included cardiac MRI and echocardiogram in  May 2022 that showed no evidence of amyloid.  EF was 71% Patient ambulates at baseline but with high fall risk so will benefit to early repair. Low to low moderate risk of cardiopulmonary events Overall poor prognostic outlook based on poor nutritional status and chronic medical conditions        Body mass index is 21.27 kg/m.  Nutrition Problem: Moderate Malnutrition Etiology: chronic illness (dementia)     Consultants: Orthopedic surgery  Procedures: Status post IM nail 6/13 1 unit packed red blood cells transfused 6/14 and again 6/18  Antimicrobials: Preop Ancef  Code Status: Full code   Subjective: Improved, still has some restless nights  Objective: Vital signs were reviewed and unremarkable. Vitals:   09/30/21 1951 10/01/21 0925  BP: 131/63 (!) 126/50  Pulse: 81 83  Resp: 16   Temp: 98.2 F (36.8 C) 98.3 F (36.8 C)  SpO2: 100% 100%   No intake or output data in the 24 hours ending 10/01/21 1547  Filed Weights   09/26/21 0147 09/26/21 1237  Weight: 55.3 kg 55.3 kg   Body mass index is 21.27 kg/m.  Exam:  General: Alert and oriented x3, no acute distress, fatigued HEENT: Normocephalic and atraumatic, mucous membranes are slightly dry Cardiovascular: Regular rate and rhythm, S1-S2 Respiratory: Clear to auscultation bilaterally Abdomen: Soft, nontender, nondistended, positive bowel sounds Musculoskeletal: No clubbing or cyanosis or edema Skin: No skin breaks, tears or lesions other than surgical site Psychiatry: Appropriate, no evidence of psychoses Neurology: No focal deficits  Data Reviewed: Hemoglobin at 7.3, creatinine mildly increased to 1.1  Disposition:  Status is: Inpatient Remains inpatient appropriate because: Skilled nursing facility on 6/19    Anticipated discharge date: 6/19   Family Communication: Daughter at the bedside DVT Prophylaxis: heparin injection 5,000 Units Start: 09/29/21 0600 SCDs Start: 09/26/21 1843 Place  TED hose Start: 09/26/21 1843    Author:  K  ,MD 10/01/2021 3:47 PM  To reach On-call, see care teams to locate the attending and reach out via [email or web address]. Between 7PM-7AM, please contact night-coverage If you still have difficulty reaching the attending provider, please page the DOC (Director on Call) for [facility] on amion for assistance.  

## 2021-10-01 NOTE — TOC Progression Note (Signed)
Transition of Care [facility]) - Progression Note    Patient Details  Name: AMBRIELLA KITT MRN: [id number] Date of Birth: [birth year]  Transition of Care [facility]) CM/SW [place], LCSW Phone Number: 10/01/2021, 10:01 AM  Clinical Narrative:    [facility] stated they can take patient IF she is able to bring her Revlimid medication from home.  CSW spoke with patient's daughter [name] via phone. She stated they can bring the medication from home. She would like patient to go to [facility].  CSW accepted bed offer from [facility], started auth in [place] if she can take patient Monday pending auth and pending patient being medically ready.   Expected Discharge Plan: [place] Barriers to Discharge: Continued Medical Work up  Expected Discharge Plan and Services Expected Discharge Plan: [place] In-house Referral: Clinical Social Work   Post Acute Care Choice: [place] Living arrangements for the past 2 months: Single Family Home                                       Social Determinants of Health (SDOH) Interventions    Readmission Risk Interventions     No data to display

## 2021-10-02 LAB — GLUCOSE, CAPILLARY
Glucose-Capillary: 151 mg/dL — ABNORMAL HIGH (ref 70–99)
Glucose-Capillary: 212 mg/dL — ABNORMAL HIGH (ref 70–99)
Glucose-Capillary: 257 mg/dL — ABNORMAL HIGH (ref 70–99)
Glucose-Capillary: 199 mg/dL — ABNORMAL HIGH (ref 70–99)
Glucose-Capillary: 148 mg/dL — ABNORMAL HIGH (ref 70–99)

## 2021-10-02 LAB — CBC
HCT: 22 % — ABNORMAL LOW (ref 36.0–46.0)
Hemoglobin: 7.7 g/dL — ABNORMAL LOW (ref 12.0–15.0)
MCH: 28 pg (ref 26.0–34.0)
MCHC: 35 g/dL (ref 30.0–36.0)
MCV: 80 fL (ref 80.0–100.0)
Platelets: 353 10*3/uL (ref 150–400)
RBC: 2.75 MIL/uL — ABNORMAL LOW (ref 3.87–5.11)
RDW: 22.9 % — ABNORMAL HIGH (ref 11.5–15.5)
WBC: 9.7 10*3/uL (ref 4.0–10.5)
nRBC: 0 % (ref 0.0–0.2)

## 2021-10-02 LAB — PREPARE RBC (CROSSMATCH)

## 2021-10-02 LAB — BASIC METABOLIC PANEL
Anion gap: 6 (ref 5–15)
BUN: 40 mg/dL — ABNORMAL HIGH (ref 8–23)
CO2: 23 mmol/L (ref 22–32)
Calcium: 8.6 mg/dL — ABNORMAL LOW (ref 8.9–10.3)
Chloride: 114 mmol/L — ABNORMAL HIGH (ref 98–111)
Creatinine, Ser: 1.11 mg/dL — ABNORMAL HIGH (ref 0.44–1.00)
GFR, Estimated: 51 mL/min — ABNORMAL LOW (ref >60)
Glucose, Bld: 153 mg/dL — ABNORMAL HIGH (ref 70–99)
Potassium: 4.8 mmol/L (ref 3.5–5.1)
Sodium: 143 mmol/L (ref 135–145)

## 2021-10-02 MED ORDER — HYDROCODONE-ACETAMINOPHEN 5-325 MG PO TABS: 1.0000 | ORAL_TABLET | ORAL | 0 refills | Status: DC | PRN

## 2021-10-02 MED ORDER — POLYETHYLENE GLYCOL 3350 17 G PO PACK: 17.0000 g | PACK | Freq: Every day | ORAL | 0 refills | Status: DC | PRN

## 2021-10-02 MED ORDER — TRAMADOL HCL 50 MG PO TABS: 50.0000 mg | ORAL_TABLET | Freq: Two times a day (BID) | ORAL | 0 refills | Status: DC | PRN

## 2021-10-02 NOTE — Progress Notes (Signed)
Physical Therapy Treatment Patient Details Name: Nancy Rodriguez MRN: [id number] DOB: [birth year] Today's Date: 10/02/2021   History of Present Illness Nancy Rodriguez is a 31yoF who comes to [facility] on 09/26/21 s/p fall, subtrochanteric femur fracture seen, taken to OR for IM fixation c Dr. Mack Guise on 6/13. Pt inititally in ED on 6/6 after fall with subsequent thigh pain upon weightbearing, tripped over a stationary bike at home, also hit her head and had subsequent AMS. PMH: CHF,RA, amyloidosis, , HTN, DM2, CKD3a. protein-calorie malnutrition. Postoperatively WBAT on Rt. Pt uses either a SPC or RW at baseline. Postop Hb down from 10.8 to 7.4, received some blood. Per DTR pt was AMB ad lib quite well, taking part in cardiac rehab, baseline mentation WNL.    PT Comments    Blood transfusion finished and RN in to disconnect.  She is able to get OOB, walk 5' to and from commode but no results then remain in recliner at bedside.  Fatigued from effort but remains motivated to get stronger.   Recommendations for follow up therapy are one component of a multi-disciplinary discharge planning process, led by the attending physician.  Recommendations may be updated based on patient status, additional functional criteria and insurance authorization.  Follow Up Recommendations  Skilled nursing-short term rehab (<3 hours/day)     Assistance Recommended at Discharge Frequent or constant Supervision/Assistance  Patient can return home with the following Assist for transportation;Help with stairs or ramp for entrance;A little help with walking and/or transfers;A little help with bathing/dressing/bathroom;Assistance with cooking/housework   Equipment Recommendations  None recommended by PT    Recommendations for Other Services       Precautions / Restrictions       Mobility  Bed Mobility Overal bed mobility: Needs Assistance Bed Mobility: Supine to Sit     Supine to sit: Min assist, HOB elevated           Transfers Overall transfer level: Needs assistance Equipment used: Rolling walker (2 wheels) Transfers: Sit to/from Stand Sit to Stand: Min assist                Ambulation/Gait Ambulation/Gait assistance: Herbalist (Feet): 6 Feet Assistive device: Rolling walker (2 wheels) Gait Pattern/deviations: Step-to pattern, Ataxic, Decreased weight shift to right, Narrow base of support Gait velocity: decreased     General Gait Details: slow antalgic gait, limited by fatigue 6' x 2 to/from commode   Stairs             Wheelchair Mobility    Modified Rankin (Stroke Patients Only)       Balance Overall balance assessment: Needs assistance Sitting-balance support: No upper extremity supported, Feet supported Sitting balance-Leahy Scale: Good     Standing balance support: Bilateral upper extremity supported Standing balance-Leahy Scale: Fair                              Cognition Arousal/Alertness: Awake/alert Behavior During Therapy: WFL for tasks assessed/performed Overall Cognitive Status: Within Functional Limits for tasks assessed                                          Exercises Other Exercises Other Exercises: supine RLE AAROM    General Comments        Pertinent Vitals/Pain Pain Assessment Pain Assessment: Faces Faces Pain Scale:  Hurts a little bit Pain Location: RLE Pain Descriptors / Indicators: Grimacing, Discomfort Pain Intervention(s): Limited activity within patient's tolerance, Monitored during session, Repositioned    Home Living                          Prior Function            PT Goals (current goals can now be found in the care plan section) Progress towards PT goals: Progressing toward goals    Frequency    7X/week      PT Plan Current plan remains appropriate    Co-evaluation              AM-PAC PT "6 Clicks" Mobility   Outcome Measure   Help needed turning from your back to your side while in a flat bed without using bedrails?: A Little Help needed moving from lying on your back to sitting on the side of a flat bed without using bedrails?: A Little Help needed moving to and from a bed to a chair (including a wheelchair)?: A Little Help needed standing up from a chair using your arms (e.g., wheelchair or bedside chair)?: A Little Help needed to walk in hospital room?: A Little Help needed climbing 3-5 steps with a railing? : A Lot 6 Click Score: 17    End of Session Equipment Utilized During Treatment: Gait belt Activity Tolerance: Patient tolerated treatment well Patient left: in chair;with chair alarm set;with call bell/phone within reach;with family/visitor present Nurse Communication: Mobility status PT Visit Diagnosis: Difficulty in walking, not elsewhere classified (R26.2);Other abnormalities of gait and mobility (R26.89);Other symptoms and signs involving the nervous system (R29.898);Repeated falls (R29.6)     Time: 7209-4709 PT Time Calculation (min) (ACUTE ONLY): 19 min  Charges:  $Therapeutic Activity: 8-22 mins                   Chesley Noon, PTA 10/02/21, 2:55 PM

## 2021-10-02 NOTE — Progress Notes (Signed)
Reached out to Lab about any updates for this patient's 1unit PRC ordered. Still not available. Unit was ordered to a supplier from [place] as previously informed by previous shift Lab staff. Lab Informed this RN via secure chat.

## 2021-10-02 NOTE — Plan of Care (Signed)

## 2021-10-02 NOTE — Progress Notes (Signed)
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  No loss of consciousness.  In the emergency room, CT scan of the head unremarkable, but she was found to have a right displaced subtrochanteric proximal femur fracture.  Patient seen by orthopedic surgery and following admission to the hospitalist service, patient was taken for an IM nail.  Postop patient has been doing well.  Noted to have some very mild confusion although not severe.  Patient did have a noticeable drop in her hemoglobin from 10.8 on admission to 7.4 on postop day 1.  She was transfused 1 unit packed red blood cells.  Hemoglobin initially improved although mild drop over the next few days.  Second unit of blood transfused on 6/19.  Assessment and Plan: Assessment and Plan: * Fracture of femoral shaft, right, closed ([place]) Status post IM nail by orthopedic surgery on 6/13.  For skilled nursing  AKI (acute kidney injury) ([place]) Creatinine on 6/13 normal and since then has been trending upward.  Possibly from blood loss.  Peaked at 1.99 on 6/15 and after IV fluids, has slowly improved and by 6/17 down to 1.0.  Stopped IV fluids after several more hours due to patient's complaints of urination.  Acute postoperative anemia due to expected blood loss Secondary to surgery.  Patient does not show signs of active bleeding.  Transfused 1 unit packed red blood cells 6/14.  Hemoglobin initially improved although with IV fluids, has had some drop since.  1 additional  unit of blood ordered for 6/18 after hemoglobin down to 7.3, however due to blood availability, blood not available until 6/19 and patient was given transfusion.  Hemoglobin actually improved on its own to 7.7 on 6/19 morning.  Hypertensive urgency BP 180/82.  Blood pressure much improved and actually soft at times since then, likely from blood loss Hydralazine IV as needed while n.p.o.  Chronic kidney disease, stage 3a ([place]) Creatinine 1.12 on 6/5.  Hemoglobin on 6/14 at 1.27, likely some acute kidney injury secondary to blood loss anemia and has improved each time with blood  Protein-calorie malnutrition, severe (HCC) Nutrition Status: Nutrition Problem: Increased nutrient needs Etiology: hip fracture Signs/Symptoms: estimated needs   Patient has only had preliminary nutritionist evaluation prior to surgery.  Follow-up planned  Delirium due to multiple etiologies, persistent, hypoactive Suspect secondary to anesthesia, medication for pain and sleep deprivation.  Patient does not have a previous history of dementia.  Remains overall alert and oriented.  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  Last seen by Dr. Tasia Catchings on 6/5 Most recent labs from 6/5 showing AST 47, ALT 30, alk phos 898 with total bilirubin of 4.7.  Skilled nursing has informed patient's  family to bring her oral chemotherapy pills as they will not be able to supply them during her stay.  Preoperative clearance-resolved as of 09/27/2021 Preoperative clearance is pending Blood work  including CMP and CBC pending at the time of admission Chest x-ray with no active disease.  EKG pending Patient had a cardiac work-up that included cardiac MRI and echocardiogram in May 2022 that showed no evidence of amyloid.  EF was 71% Patient ambulates at baseline but with high fall risk so will benefit to early repair. Low to low moderate risk of cardiopulmonary events Overall poor prognostic outlook based on poor nutritional status and chronic medical conditions        Body mass index is 21.27 kg/m.  Nutrition Problem: Moderate Malnutrition Etiology: chronic illness (dementia)     Consultants: Orthopedic surgery  Procedures: Status post IM nail 6/13 1 unit packed red blood cells transfused 6/14 and again 6/19  Antimicrobials: Preop Ancef  Code Status: Full code   Subjective: Patient feeling okay, no complaints  Objective: Vital signs were reviewed and unremarkable. Vitals:   10/02/21 1100 10/02/21 1115  BP: 127/60 (!) 118/58  Pulse: 78 77  Resp: 16 18  Temp: 97.8 F (36.6 C) 98.1 F (36.7 C)  SpO2: 100% 100%    Intake/Output Summary (Last 24 hours) at 10/02/2021 1629 Last data filed at 10/02/2021 1400 Gross per 24 hour  Intake 240 ml  Output 800 ml  Net -560 ml    Filed Weights   09/26/21 0147 09/26/21 1237  Weight: 55.3 kg 55.3 kg   Body mass index is 21.27 kg/m.  Exam:  General: Alert and oriented x3, no acute distress, fatigued HEENT: Normocephalic and atraumatic, mucous membranes are slightly dry Cardiovascular: Regular rate and rhythm, S1-S2 Respiratory: Clear to auscultation bilaterally Abdomen: Soft, nontender, nondistended, positive bowel sounds Musculoskeletal: No clubbing or cyanosis or edema Skin: No skin breaks, tears or lesions other than surgical site Psychiatry: Appropriate, no evidence of psychoses Neurology: No focal deficits  Data Reviewed: Creatinine at 1.11.  Hemoglobin of 7.7  Disposition:  Status is:  Inpatient Remains inpatient appropriate because: Skilled nursing facility on 6/20    Anticipated discharge date: 6/20   Family Communication: Daughter at the bedside, also updated son by phone DVT Prophylaxis: heparin injection 5,000 Units Start: 09/29/21 0600 SCDs Start: 09/26/21 1843 Place TED hose Start: 09/26/21 1843    Author: Annita Brod ,MD 10/02/2021 4:29 PM  To reach On-call, see care teams to locate the attending and reach out via [email or web address]. Between 7PM-7AM, please contact night-coverage If you still have difficulty reaching the attending provider, please page the [facility] (Director on Call) for [facility] on amion for assistance.

## 2021-10-02 NOTE — Plan of Care (Signed)
  Problem: Education: Goal: Knowledge of General Education information will improve Description Including pain rating scale, medication(s)/side effects and non-pharmacologic comfort measures Outcome: Progressing   Problem: Health Behavior/Discharge Planning: Goal: Ability to manage health-related needs will improve Outcome: Progressing   

## 2021-10-02 NOTE — Progress Notes (Signed)
Occupational Therapy Treatment Patient Details Name: BABETTA PATERSON MRN: [id number] DOB: [birth year] Today's Date: 10/02/2021   History of present illness Atira Borello is a 89yoF who comes to [facility] on 09/26/21 s/p fall, subtrochanteric femur fracture seen, taken to OR for IM fixation c Dr. Mack Guise on 6/13. Pt inititally in ED on 6/6 after fall with subsequent thigh pain upon weightbearing, tripped over a stationary bike at home, also hit her head and had subsequent AMS. PMH: CHF,RA, amyloidosis, , HTN, DM2, CKD3a. protein-calorie malnutrition. Postoperatively WBAT on Rt. Pt uses either a SPC or RW at baseline. Postop Hb down from 10.8 to 7.4, received some blood. Per DTR pt was AMB ad lib quite well, taking part in cardiac rehab, baseline mentation WNL.   OT comments  Ms. Ingber is more mentally alert than she has been in previous sessions; today she is A&Ox4, does not display any confusion or inattention to task. She does report feeling very fatigued and endorses 7/10 pain. She declines OOB activity but is willing to engage in bed-level therex and to attempt various supine postures to find position in which she is most comfortable. Provided ongoing educ re: falls prevention, attention to skin integrity, recovery process. Pt endorses understanding.    Recommendations for follow up therapy are one component of a multi-disciplinary discharge planning process, led by the attending physician.  Recommendations may be updated based on patient status, additional functional criteria and insurance authorization.    Follow Up Recommendations  Skilled nursing-short term rehab (<3 hours/day)    Assistance Recommended at Discharge Frequent or constant Supervision/Assistance  Patient can return home with the following  A lot of help with walking and/or transfers;A lot of help with bathing/dressing/bathroom;Assistance with cooking/housework;Help with stairs or ramp for entrance;Assist for transportation    Equipment Recommendations       Recommendations for Other Services      Precautions / Restrictions Restrictions Weight Bearing Restrictions: Yes RLE Weight Bearing: Weight bearing as tolerated       Mobility Bed Mobility Overal bed mobility: Needs Assistance Bed Mobility: Rolling, Sidelying to Sit, Sit to Sidelying Rolling: Min guard Sidelying to sit: Min assist Supine to sit: Min assist     General bed mobility comments: Focused on repositioning for comfort and skin integrity    Transfers                   General transfer comment: deferred per pt request, as she had just returned to bed     Balance Overall balance assessment: Needs assistance Sitting-balance support: No upper extremity supported, Feet supported Sitting balance-Leahy Scale: Good Sitting balance - Comments: pt able to scoot self to edge of bed                                   ADL either performed or assessed with clinical judgement   ADL                                              Extremity/Trunk Assessment Upper Extremity Assessment Upper Extremity Assessment: Generalized weakness   Lower Extremity Assessment Lower Extremity Assessment: Generalized weakness;RLE deficits/detail RLE Deficits / Details: s/p intramedullary fixation of femoral fx        Vision       Perception  Praxis      Cognition Arousal/Alertness: Awake/alert                                     General Comments: Pt more alert, better oriented today.        Exercises General Exercises - Lower Extremity Ankle Circles/Pumps: Both, 10 reps, AROM Ankle Circles/Pumps Limitations: Able to perform today w/o cueing, good attention to task Heel Slides: Both, 5 reps, AAROM, Supine Other Exercises Other Exercises: Bed level therex, educ re pain mgmt, falls prevention    Shoulder Instructions       General Comments      Pertinent Vitals/ Pain        Pain Assessment Pain Assessment: 0-10 Pain Score: 6  Pain Descriptors / Indicators: Grimacing, Shooting, Tingling Pain Intervention(s): Limited activity within patient's tolerance, Monitored during session, Repositioned, Relaxation, Ice applied  Home Living                                          Prior Functioning/Environment              Frequency  Min 2X/week        Progress Toward Goals  OT Goals(current goals can now be found in the care plan section)  Progress towards OT goals: Progressing toward goals  Acute Rehab OT Goals OT Goal Formulation: With patient Time For Goal Achievement: 10/11/21 Potential to Achieve Goals: Good  Plan Discharge plan remains appropriate;Frequency remains appropriate    Co-evaluation                 AM-PAC OT "6 Clicks" Daily Activity     Outcome Measure   Help from another person eating meals?: A Little Help from another person taking care of personal grooming?: A Little Help from another person toileting, which includes using toliet, bedpan, or urinal?: A Lot Help from another person bathing (including washing, rinsing, drying)?: A Lot Help from another person to put on and taking off regular upper body clothing?: A Little Help from another person to put on and taking off regular lower body clothing?: A Lot 6 Click Score: 15    End of Session    OT Visit Diagnosis: Unsteadiness on feet (R26.81);Pain;Muscle weakness (generalized) (M62.81);History of falling (Z91.81) Pain - Right/Left: Right Pain - part of body: Leg   Activity Tolerance Patient tolerated treatment well;Patient limited by pain   Patient Left in bed;with call bell/phone within reach;with family/visitor present   Nurse Communication          Time: 1600-1620 OT Time Calculation (min): 20 min  Charges: OT General Charges $OT Visit: 1 Visit OT Treatments $Self Care/Home Management : 8-22 mins  Josiah Lobo, PhD, MS,  OTR/L 10/02/21, 4:31 PM

## 2021-10-02 NOTE — TOC Progression Note (Signed)
Transition of Care [facility]) - Progression Note    Patient Details  Name: SHANIRA TINE MRN: [id number] Date of Birth: 03/08/1946  Transition of Care [facility]) CM/SW Contact  Eileen Stanford, LCSW Phone Number: 10/02/2021, 3:10 PM  Clinical Narrative:   After many attempts to reach pt's son CSW received a call back. Pt's son would provided the alternative bed offers. Pt's Son now stating his plan to appeal dc. CSW provided the number off the IM for pt's son to call.     Expected Discharge Plan: [place] Barriers to Discharge: Continued Medical Work up  Expected Discharge Plan and Services Expected Discharge Plan: [place] In-house Referral: Clinical Social Work   Post Acute Care Choice: [place] Living arrangements for the past 2 months: Single Family Home Expected Discharge Date: 10/02/21                                     Social Determinants of Health (SDOH) Interventions    Readmission Risk Interventions     No data to display

## 2021-10-02 NOTE — TOC Progression Note (Signed)
Transition of Care [facility]) - Progression Note    Patient Details  Name: Nancy Rodriguez MRN: [id number] Date of Birth: Sep 18, 1945  Transition of Care [facility]) CM/SW Contact  Eileen Stanford, LCSW Phone Number: 10/02/2021, 10:26 AM  Clinical Narrative:   [name] obtained for [facility]. [id number]    Expected Discharge Plan: [place] Barriers to Discharge: Continued Medical Work up  Expected Discharge Plan and Services Expected Discharge Plan: [place] In-house Referral: Clinical Social Work   Post Acute Care Choice: [place] Living arrangements for the past 2 months: Single Family Home                                       Social Determinants of Health (SDOH) Interventions    Readmission Risk Interventions     No data to display

## 2021-10-02 NOTE — Care Management Important Message (Signed)
Important Message  Patient Details  Name: Nancy Rodriguez MRN: [id number] Date of Birth: [birth year]   Medicare Important Message Given:  Yes  I reviewed the Important Message from Medicare with the patient's son, Nancy Rodriguez and he stated he understood her rights.  I also reviewed the appeal process with him at his request.  I wished her well and thanked him for his time.   Juliann Pulse A Magaly Pollina 10/02/2021, 10:52 AM

## 2021-10-03 LAB — TYPE AND SCREEN
ABO/RH(D): A POS
Antibody Screen: POSITIVE
Unit division: 0
Unit division: 0
Unit division: 0

## 2021-10-03 LAB — CBC
HCT: 26.2 % — ABNORMAL LOW (ref 36.0–46.0)
Hemoglobin: 9 g/dL — ABNORMAL LOW (ref 12.0–15.0)
MCH: 28 pg (ref 26.0–34.0)
MCHC: 34.4 g/dL (ref 30.0–36.0)
MCV: 81.4 fL (ref 80.0–100.0)
Platelets: 353 10*3/uL (ref 150–400)
RBC: 3.22 MIL/uL — ABNORMAL LOW (ref 3.87–5.11)
RDW: 21.3 % — ABNORMAL HIGH (ref 11.5–15.5)
WBC: 10.5 10*3/uL (ref 4.0–10.5)
nRBC: 0 % (ref 0.0–0.2)

## 2021-10-03 LAB — BPAM RBC
Blood Product Expiration Date: 202306242359
Blood Product Expiration Date: 202306292359
Blood Product Expiration Date: 202306302359
ISSUE DATE / TIME: 202306141104
Unit Type and Rh: 600
Unit Type and Rh: 6200
Unit Type and Rh: 6200

## 2021-10-03 LAB — GLUCOSE, CAPILLARY
Glucose-Capillary: 144 mg/dL — ABNORMAL HIGH (ref 70–99)
Glucose-Capillary: 148 mg/dL — ABNORMAL HIGH (ref 70–99)
Glucose-Capillary: 226 mg/dL — ABNORMAL HIGH (ref 70–99)
Glucose-Capillary: 245 mg/dL — ABNORMAL HIGH (ref 70–99)

## 2021-10-03 LAB — BASIC METABOLIC PANEL
Anion gap: 3 — ABNORMAL LOW (ref 5–15)
BUN: 48 mg/dL — ABNORMAL HIGH (ref 8–23)
CO2: 22 mmol/L (ref 22–32)
Calcium: 8.8 mg/dL — ABNORMAL LOW (ref 8.9–10.3)
Chloride: 116 mmol/L — ABNORMAL HIGH (ref 98–111)
Creatinine, Ser: 1.26 mg/dL — ABNORMAL HIGH (ref 0.44–1.00)
GFR, Estimated: 44 mL/min — ABNORMAL LOW (ref >60)
Glucose, Bld: 185 mg/dL — ABNORMAL HIGH (ref 70–99)
Potassium: 4.8 mmol/L (ref 3.5–5.1)
Sodium: 141 mmol/L (ref 135–145)

## 2021-10-03 MED ORDER — SODIUM CHLORIDE 0.9 % IV SOLN: INTRAVENOUS | Status: DC

## 2021-10-03 MED ORDER — ENOXAPARIN SODIUM 40 MG/0.4ML IJ SOSY: 40.0000 mg | PREFILLED_SYRINGE | INTRAMUSCULAR | 0 refills | Status: DC

## 2021-10-03 NOTE — Progress Notes (Signed)
Patient stated she wishes to DC to [facility] today.

## 2021-10-03 NOTE — Discharge Summary (Addendum)
Physician Discharge Summary   Patient: Nancy Rodriguez MRN: [id number] DOB: 06-06-1945  Admit date:     09/26/2021  Discharge date: 10/03/21  Discharge Physician: Annita Brod   PCP: Leonel Ramsay, MD   Recommendations at discharge:   New medication: Ultram 50 mg p.o. every 6 hours as needed for moderate pain New medication: MiraLAX 17 g p.o. daily as needed for constipation New medication: Vicodin 5/325 1-2 tabs every 4 hours as needed for severe pain New medication: Lovenox 40 mg subcu daily x2 weeks Patient being discharged to skilled nursing facility Patient will follow-up with Dr. 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  No loss of consciousness.  In the emergency room, CT scan of the head unremarkable, but she was found to have a right displaced subtrochanteric proximal femur fracture.  Patient seen by orthopedic surgery and following admission to the hospitalist service, patient was taken for an IM nail.  Postop patient has been doing well.  Noted to have some very mild confusion although not severe.  Patient did have a  noticeable drop in her hemoglobin from 10.8 on admission to 7.4 on postop day 1.  She was transfused 1 unit packed red blood cells and again on 6/19.  Felt to be medically stable for discharge on 6/20.  Assessment and Plan: * Fracture of femoral shaft, right, closed ([place]) Status post IM nail by orthopedic surgery on 6/13.  For skilled nursing.  As needed Ultram for moderate pain and as needed Vicodin for severe pain.  Follow-up with orthopedic surgery in 1 week.  Lovenox 40 mg daily until stopped by orthopedic surgery and office for follow-up  AKI (acute kidney injury) ([place]) Creatinine on 6/13 normal and since then has been trending upward.  Possibly from blood loss.  Peaked at 1.99 on 6/15 and after IV fluids, has slowly improved and by 6/17 down to 1.0.  Stopped IV fluids after several more hours due to patient's complaints of urination.  Had started to trend back upward likely due to dehydration and some remaining blood loss.  Creatinine on day of discharge at 1.26 and so started some additional IV fluids  Acute postoperative anemia due to expected blood loss Secondary to surgery.  Patient does not show signs of active bleeding.  Transfused 1 unit packed red blood cells 6/14.  Hemoglobin initially improved although with IV fluids, has had some drop since.  1 additional unit of blood ordered for 6/18 after hemoglobin down to 7.3, however due to blood availability, blood not available until 6/19 and patient was given transfusion.  Hemoglobin up to 9.0 on day of discharge.  Hypertensive urgency BP 180/82.  Blood pressure much improved and actually soft at times since then, likely from blood loss.  Blood pressure with a systolic in the 443X by time of discharge.  Chronic kidney disease, stage 3a ([place]) Creatinine 1.12 on 6/5.  Hemoglobin on 6/14 at 1.27, likely some acute kidney injury secondary to blood loss anemia and has improved each time with blood  Malnutrition of moderate degree Nutrition  Status: Nutrition Problem: Moderate Malnutrition Etiology: chronic illness (dementia) Signs/Symptoms: moderate fat depletion, moderate muscle depletion Interventions: Ensure Enlive (each supplement provides 350kcal and 20 grams of protein), MVI     Delirium due to multiple etiologies, persistent, hypoactive Suspect secondary to anesthesia, medication for pain and sleep deprivation.  Patient does not have a previous history of dementia.  Remains overall alert and oriented.  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  Last seen by Dr. Tasia Catchings on 6/5 Most recent labs from 6/5 showing AST 47, ALT 30, alk phos 898 with total bilirubin of 4.7.  Skilled nursing has informed patient's family to bring her oral chemotherapy pills as they will not be able to supply them during her stay.  Preoperative clearance-resolved as of 09/27/2021 Preoperative clearance is pending Blood work including CMP and CBC pending at the time of admission Chest x-ray with no active disease.  EKG pending Patient had a cardiac work-up that included cardiac MRI and echocardiogram in May 2022 that showed no evidence of amyloid.  EF was 71% Patient ambulates at baseline but with high fall risk so will benefit to early repair. Low to low moderate risk of cardiopulmonary events Overall poor prognostic outlook based on poor nutritional status and chronic medical conditions         Pain control - [place] Controlled Substance Reporting System database was reviewed. and patient was instructed, not to drive, operate heavy machinery, perform activities at heights, swimming or participation in water activities or provide baby-sitting services while on Pain, Sleep and Anxiety Medications; until their outpatient Physician has advised to do so again. Also recommended to not to take more  than prescribed Pain, Sleep and Anxiety Medications.  Consultants: Orthopedic surgery Procedures: Status post IM nail 6/13 1 unit packed red blood cells transfused 6/14 and again 6/19 Disposition: Skilled nursing facility Diet recommendation:  Discharge Diet Orders (From admission, onward)     Start     Ordered   10/02/21 0000  Diet - low sodium heart healthy        10/02/21 1410           Cardiac diet DISCHARGE MEDICATION: Allergies as of 10/03/2021       Reactions   Benazepril Anaphylaxis, Swelling   TONGUE AND LIPS   Lisinopril Swelling   Tolmetin Swelling   Nsaids Swelling        Medication List     TAKE these medications    acyclovir 400 MG tablet Commonly known as: ZOVIRAX Take 1 tablet (400 mg total) by mouth 2 (two) times daily.   albuterol 108 (90 Base) MCG/ACT inhaler Commonly known as: VENTOLIN HFA Inhale 2 puffs into the lungs every 4 (four) hours as needed for wheezing or shortness of breath.   amLODipine 5 MG tablet Commonly known as: NORVASC Take 5 mg by mouth daily.   [employer] Vitamin C 500 MG tablet Generic drug: ascorbic acid TAKE 1 TABLET BY MOUTH DAILY   dexamethasone 4 MG tablet  Commonly known as: DECADRON TAKE 5 TABLETS BY MOUTH ONCE A WEEK.   enoxaparin 40 MG/0.4ML injection Commonly known as: LOVENOX Inject 0.4 mLs (40 mg total) into the skin daily for 14 days.   EPINEPHrine 0.3 mg/0.3 mL Soaj injection Commonly known as: EPI-PEN Inject 0.3 mg into the muscle as needed.   ferrous sulfate 325 (65 FE) MG EC tablet Take 325 mg by mouth daily.   FreeStyle Libre 14 Day Sensor Misc SMARTSIG:1 Kit(s) Topical Every 2 Weeks   gabapentin 100 MG capsule Commonly known as: NEURONTIN Take 100 mg by mouth at bedtime.   HYDROcodone-acetaminophen 5-325 MG tablet Commonly known as: NORCO/VICODIN Take 1-2 tablets by mouth every 4 (four) hours as needed for moderate pain (pain score 4-6).   insulin lispro 100 UNIT/ML injection Commonly  known as: HUMALOG Inject into the skin 3 (three) times daily before meals. Sliding scale   Klor-Con M20 20 MEQ tablet Generic drug: potassium chloride SA TAKE 1 TABLET BY MOUTH TWICE A DAY   Lantus SoloStar 100 UNIT/ML Solostar Pen Generic drug: insulin glargine Inject 15 Units into the skin at bedtime.   lenalidomide 10 MG capsule Commonly known as: REVLIMID Take 1 capsule (10 mg total) by mouth daily. Take for 21 days, then hold for 7 days. Repeat every 28 days.   loperamide 2 MG capsule Commonly known as: IMODIUM Take 1 capsule (2 mg total) by mouth See admin instructions. Initial: 4 mg, followed by 2 mg after each loose stool, maximum 16 mg within 24 hours.   metoprolol succinate 50 MG 24 hr tablet Commonly known as: TOPROL-XL Take 50 mg by mouth daily.   omeprazole 40 MG capsule Commonly known as: PRILOSEC Take 40 mg by mouth 2 (two) times daily.   polyethylene glycol 17 g packet Commonly known as: MIRALAX / GLYCOLAX Take 17 g by mouth daily as needed for mild constipation.   sertraline 25 MG tablet Commonly known as: ZOLOFT Take 50 mg by mouth daily.   torsemide 20 MG tablet Commonly known as: DEMADEX Take 20 mg by mouth daily as needed.   traMADol 50 MG tablet Commonly known as: ULTRAM Take 1 tablet (50 mg total) by mouth every 12 (twelve) hours as needed for moderate pain.               Discharge Care Instructions  (From admission, onward)           Start     Ordered   10/02/21 0000  Discharge wound care:       Comments: Reinforce dressing   10/02/21 1410            Follow-up Information     Renee Harder, MD Follow up in 1 week(s).   Specialty: Orthopedic Surgery Why: Follow up with orthopedics in office in 7-10 days; Office will call Patient Contact information: [address]. [place] [address] [phone]                Discharge Exam: Danley Danker Weights   09/26/21 0147 09/26/21 1237  Weight: 55.3 kg 55.3 kg    General: Alert and oriented x3, Cardiovascular: Regular rate and rhythm, S1-S2 Lungs: Clear to auscultation bilaterally  Condition at discharge: good  The results of significant diagnostics from this hospitalization (including imaging, microbiology, ancillary and laboratory) are listed below for reference.   Imaging Studies: DG Wrist 2 Views Left  Result Date: 09/27/2021 CLINICAL DATA:  Left wrist pain after fall yesterday. EXAM: LEFT WRIST - 2  VIEW COMPARISON:  None available FINDINGS: There is 1 mm ulnar negative variance. Moderate to severe thumb carpometacarpal and moderate triscaphe joint space narrowing and peripheral osteophytosis. Mild-to-moderate second carpometacarpal joint space narrowing and peripheral osteophytosis. Moderate distal radial styloid degenerative spur contacts a degenerative spur at the distal lateral aspect of the scaphoid. Bone overlap in different regions of the scaphoid on frontal and lateral views limits evaluation for an acute fracture. This includes within the region of the distal lateral aspect of the scaphoid where a prominent degenerative spur overlies the distal radial styloid. There is decreased scaphoid bone mineralization in this region with multiple lucent likely bone nutrient foramina. No definite acute fracture is visualized. IMPRESSION: 1. 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 2. Moderate degenerative changes of the distal radial styloid and adjacent distal lateral aspect of the scaphoid. 3. No definite acute fracture is seen. If there is there is snuffbox tenderness and clinical concern for a radiographically occult fracture, consider splinting and follow-up radiographs in 10 days. Electronically Signed   By: Neita Garnet M.D.   On: 09/27/2021 11:00   DG FEMUR, MIN 2 VIEWS RIGHT  Result Date: 09/26/2021 CLINICAL DATA:  Status post right IM nail fixation EXAM: RIGHT FEMUR 2 VIEWS; PORTABLE PELVIS 1-2 VIEWS  COMPARISON:  09/26/2021 intraoperative images and hip radiographs FINDINGS: Status post intramedullary nail fixation of a previously noted proximal right femoral diaphyseal fracture with improved alignment of the fracture components. No perihardware lucency or additional acute fracture in the pelvis or right femur. Air within the soft tissues, not unexpected post surgically. Superficial skin staples. IMPRESSION: Expected postoperative appearance, status post intramedullary nail fixation of a right femoral fracture. Electronically Signed   By: Wiliam Ke M.D.   On: 09/26/2021 17:38   Pelvis Portable  Result Date: 09/26/2021 CLINICAL DATA:  Status post right IM nail fixation EXAM: RIGHT FEMUR 2 VIEWS; PORTABLE PELVIS 1-2 VIEWS COMPARISON:  09/26/2021 intraoperative images and hip radiographs FINDINGS: Status post intramedullary nail fixation of a previously noted proximal right femoral diaphyseal fracture with improved alignment of the fracture components. No perihardware lucency or additional acute fracture in the pelvis or right femur. Air within the soft tissues, not unexpected post surgically. Superficial skin staples. IMPRESSION: Expected postoperative appearance, status post intramedullary nail fixation of a right femoral fracture. Electronically Signed   By: Wiliam Ke M.D.   On: 09/26/2021 17:38   DG FEMUR, MIN 2 VIEWS RIGHT  Result Date: 09/26/2021 CLINICAL DATA:  Right proximal femoral ORIF EXAM: RIGHT FEMUR 2 VIEWS COMPARISON:  Pelvis and right hip radiographs 09/26/2021 FINDINGS: Images were performed intraoperatively without the presence of a radiologist. Redemonstration of oblique proximal right femoral diaphyseal fracture. New long-stem cephalo medullary nail fixation of this fracture with improved now anatomic alignment. No complication visualized. Total fluoroscopy images: 6 Total fluoroscopy time: 183 seconds Please see intraoperative findings for further detail. IMPRESSION:  Intraoperative fluoroscopy for proximal right femoral ORIF. Electronically Signed   By: Neita Garnet M.D.   On: 09/26/2021 17:04   DG C-Arm 1-60 Min-No Report  Result Date: 09/26/2021 Fluoroscopy was utilized by the requesting physician.  No radiographic interpretation.   DG C-Arm 1-60 Min-No Report  Result Date: 09/26/2021 Fluoroscopy was utilized by the requesting physician.  No radiographic interpretation.   DG C-Arm 1-60 Min-No Report  Result Date: 09/26/2021 Fluoroscopy was utilized by the requesting physician.  No radiographic interpretation.   DG Chest Portable 1 View  Result Date: 09/26/2021 CLINICAL DATA:  Fall, preop EXAM: PORTABLE CHEST 1 VIEW COMPARISON:  None Available. FINDINGS: Elevation of the right hemidiaphragm. Heart and mediastinal contours are within normal limits. No focal opacities or effusions. No acute bony abnormality. IMPRESSION: No active disease. Electronically Signed   By: Rolm Baptise M.D.   On: 09/26/2021 03:11   DG Hip Unilat W or Wo Pelvis 2-3 Views Right  Result Date: 09/26/2021 CLINICAL DATA:  Fall EXAM: DG HIP (WITH OR WITHOUT PELVIS) 2-3V RIGHT COMPARISON:  None Available. FINDINGS: There is a fracture through the proximal shaft of the right femur. Femoral shaft is displaced medially relative to the femoral neck. No subluxation or dislocation at the hip. IMPRESSION: Displaced right proximal femoral shaft fracture. Electronically Signed   By: Rolm Baptise M.D.   On: 09/26/2021 03:07   CT Cervical Spine Wo Contrast  Result Date: 09/26/2021 CLINICAL DATA:  Facial trauma, blunt.  Unwitnessed fall. EXAM: CT CERVICAL SPINE WITHOUT CONTRAST TECHNIQUE: Multidetector CT imaging of the cervical spine was performed without intravenous contrast. Multiplanar CT image reconstructions were also generated. RADIATION DOSE REDUCTION: This exam was performed according to the departmental dose-optimization program which includes automated exposure control, adjustment of the  mA and/or kV according to patient size and/or use of iterative reconstruction technique. COMPARISON:  None Available. FINDINGS: Alignment: No subluxation Skull base and vertebrae: No acute fracture. No primary bone lesion or focal pathologic process. Soft tissues and spinal canal: No prevertebral fluid or swelling. No visible canal hematoma. Disc levels:  Diffuse degenerative disc disease and facet disease. Upper chest: No acute findings Other: None IMPRESSION: No acute bony abnormality. Electronically Signed   By: Rolm Baptise M.D.   On: 09/26/2021 02:46   CT Head Wo Contrast  Result Date: 09/26/2021 CLINICAL DATA:  Head trauma, minor (Age >= 65y).  Unwitnessed fall EXAM: CT HEAD WITHOUT CONTRAST TECHNIQUE: Contiguous axial images were obtained from the base of the skull through the vertex without intravenous contrast. RADIATION DOSE REDUCTION: This exam was performed according to the departmental dose-optimization program which includes automated exposure control, adjustment of the mA and/or kV according to patient size and/or use of iterative reconstruction technique. 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 No new hemorrhage, hydrocephalus or acute infarction. No mass effect or midline shift. Mild atrophy. Vascular: No hyperdense vessel or unexpected calcification. Skull: No acute calvarial abnormality. Sinuses/Orbits: No acute findings Other: None IMPRESSION: Small bilateral chronic subdural hematomas, stable or slightly smaller since prior study. No acute intracranial abnormality. Electronically Signed   By: Rolm Baptise M.D.   On: 09/26/2021 02:45   CT Cervical Spine Wo Contrast  Result Date: 09/19/2021 CLINICAL DATA:  Fall EXAM: CT CERVICAL SPINE WITHOUT CONTRAST TECHNIQUE: Multidetector CT imaging of the cervical spine was performed without intravenous contrast.  Multiplanar CT image reconstructions were also generated. RADIATION DOSE REDUCTION: This exam was performed according to the departmental dose-optimization program which includes automated exposure control, adjustment of the mA and/or kV according to patient size and/or use of iterative reconstruction technique. COMPARISON:  No prior CT of the cervical spine, correlation is made with MRI cervical spine 10/08/2020 FINDINGS: Alignment: Straightening and mild reversal of the normal cervical lordosis, with unchanged alignment compared to 10/08/2020. No traumatic listhesis. Skull base and vertebrae: No acute fracture or suspicious osseous lesion. Soft tissues and spinal canal: No prevertebral fluid or swelling. No visible canal hematoma. Disc levels: No high-grade spinal  canal stenosis. Multilevel uncovertebral and facet arthropathy, which causes moderate to severe neural foraminal narrowing bilaterally at C4-C5 and on the right at C5-C6 and C6-C7. Upper chest: No focal pulmonary opacity or pleural effusion. Other: None IMPRESSION: No acute fracture or traumatic listhesis in the cervical spine. Electronically Signed   By: Merilyn Baba M.D.   On: 09/19/2021 11:54   CT Head Wo Contrast  Result Date: 09/19/2021 CLINICAL DATA:  Head trauma, moderate-severe EXAM: CT HEAD WITHOUT CONTRAST TECHNIQUE: Contiguous axial images were obtained from the base of the skull through the vertex without intravenous contrast. RADIATION DOSE REDUCTION: This exam was performed according to the departmental dose-optimization program which includes automated exposure control, adjustment of the mA and/or kV according to patient size and/or use of iterative reconstruction technique. COMPARISON:  07/12/2021 FINDINGS: Brain: Small chronic bilateral subdural hematomas are stable in size from prior measuring approximately 5 mm on the right and 3 mm on the left. No significant mass effect or midline shift. No evidence of acute infarction,  hydrocephalus, or mass lesion. Patchy low-density changes within the periventricular and subcortical white matter compatible with chronic microvascular ischemic change. Moderate diffuse cerebral volume loss. Vascular: Atherosclerotic calcifications involving the large vessels of the skull base. No unexpected hyperdense vessel. Skull: Normal. Negative for fracture or focal lesion. Sinuses/Orbits: No acute finding. Other: None. IMPRESSION: 1. Stable small chronic bilateral subdural hematomas. 2. No new acute intracranial findings. 3. 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 Electronically Signed   By: Davina Poke D.O.   On: 09/19/2021 11:53   DG FEMUR, MIN 2 VIEWS RIGHT  Result Date: 09/19/2021 CLINICAL DATA:  Right thigh pain after a fall. EXAM: RIGHT FEMUR 2 VIEWS COMPARISON:  No comparison studies available. FINDINGS: No fracture. No worrisome lytic or sclerotic osseous abnormality. Degenerative changes noted at the knee. IMPRESSION: No acute bony abnormality. Electronically Signed   By: Misty Stanley M.D.   On: 09/19/2021 11:47   DG Knee Complete 4 Views Right  Result Date: 09/19/2021 CLINICAL DATA:  Patient tripped yesterday now with thigh pain. EXAM: RIGHT KNEE - COMPLETE 4+ VIEW COMPARISON:  None Available. FINDINGS: Four view study. No evidence for an acute fracture. No subluxation or dislocation. No joint effusion. Loss of joint space noted medial compartment with advanced hypertrophic spurring visible in all 3 compartments IMPRESSION: 1. No acute bony abnormality. No joint effusion. 2. Advanced tricompartmental osteoarthritis. Electronically Signed   By: Misty Stanley M.D.   On: 09/19/2021 11:46    Microbiology: Results for orders placed or performed in visit on 08/22/21  C difficile quick screen w PCR reflex     Status: None   Collection Time: 08/22/21 10:11 AM   Specimen: STOOL  Result Value Ref Range Status   C Diff antigen NEGATIVE NEGATIVE Final   C Diff toxin  NEGATIVE NEGATIVE Final   C Diff interpretation No C. difficile detected.  Final    Comment: Performed at [facility], [address] [place], [place] [address]    Labs: CBC: Recent Labs  Lab 09/29/21 0328 09/30/21 0431 10/01/21 0407 10/02/21 0600 10/03/21 0300  WBC 13.1* 12.5* 10.7* 9.7 10.5  HGB 7.4* 7.7* 7.3* 7.7* 9.0*  HCT 21.5* 22.1* 21.2* 22.0* 26.2*  MCV 79.0* 79.2* 80.3 80.0 81.4  PLT 297 329 353 353 170   Basic Metabolic Panel: Recent Labs  Lab 09/29/21 0328 09/30/21 0431 10/01/21 0407 10/02/21 0600 10/03/21 0300  NA 141 140 141 143 141  K 4.6  4.4 4.6 4.8 4.8  CL 114* 113* 114* 114* 116*  CO2 22 21* $Remo'23 23 22  'MoKqm$ GLUCOSE 89 113* 163* 153* 185*  BUN 41* 31* 34* 40* 48*  CREATININE 1.49* 1.00 1.10* 1.11* 1.26*  CALCIUM 8.3* 8.3* 8.3* 8.6* 8.8*   Liver Function Tests: No results for input(s): "AST", "ALT", "ALKPHOS", "BILITOT", "PROT", "ALBUMIN" in the last 168 hours. CBG: Recent Labs  Lab 10/02/21 1659 10/02/21 2045 10/02/21 2213 10/03/21 0741 10/03/21 1140  GLUCAP 257* 199* 148* 144* 148*    Discharge time spent: less than 30 minutes.  Signed: Annita Brod, MD [facility] 10/03/2021

## 2021-10-03 NOTE — TOC Progression Note (Addendum)
Transition of Care [facility]) - Progression Note    Patient Details  Name: Nancy Rodriguez MRN: [id number] Date of Birth: 1945-12-19  Transition of [facility]) CM/SW Contact  Eileen Stanford, LCSW Phone Number: 10/03/2021, 1:25 PM  Clinical Narrative:  CSW received a call from pt's daughter [name] at bedside and states pt will dc to [facility] today. Pt is alert and oriented and also stated to RN that she is dc to [facility] today. [facility] notified and prepared to take pt.  CSW confirmed with pt that she wants to dc to [facility] today.     Expected Discharge Plan: [place] Barriers to Discharge: Continued Medical Work up  Expected Discharge Plan and Services Expected Discharge Plan: [place] In-house Referral: Clinical Social Work   Post Acute Care Choice: [place] Living arrangements for the past 2 months: Single Family Home Expected Discharge Date: 10/03/21                                     Social Determinants of Health (SDOH) Interventions    Readmission Risk Interventions     No data to display

## 2021-10-03 NOTE — Progress Notes (Signed)
Nutrition Follow-up  DOCUMENTATION CODES:   Non-severe (moderate) malnutrition in context of chronic illness  INTERVENTION:   -Continue Ensure Enlive po TID, each supplement provides 350 kcal and 20 grams of protein -Continue MVI with minerals daily  NUTRITION DIAGNOSIS:   Moderate Malnutrition related to chronic illness (dementia) as evidenced by moderate fat depletion, moderate muscle depletion.  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  6/13- s/p INTRAMEDULLARY (IM) NAIL INTERTROCHANTRIC  Reviewed I/O's: +480 ml x 24 hours and +946 ml since admission  UOP: 500 ml x 24 hours   Pt sitting up in bed at time of visit, eating breakfast with her hands at time of visit. RD assisted pt with tray set up and unwrapping utensils. Pt reports her appetite has improved. Noted meal completions 50-100%. Pt has been drinking Ensure supplements, stating the she enjoys them. Discussed importance of good meal and supplement intake to promote healing. Pt expressed gratitude for visit.   Per chart review, plan to d/c to SNF once medically stable for discharge.   Medications reviewed and include colace, ferrous sulfate, potassium chloride, senokot, and 0.9% sodium chloride infusion @ 75 ml/hr.  Labs reviewed: CBGS: 144-257 (inpatient orders for glycemic control are 0-15 units insulin aspart TID with meals and 0-5 units insulin aspart daily at bedtime).    Diet Order:   Diet Order             Diet - low sodium heart healthy           Diet regular Room service appropriate? Yes; Fluid consistency: Thin  Diet effective now                    EDUCATION NEEDS:   Not appropriate for education at this time  Skin:  Skin Assessment: Skin Integrity Issues: Skin Integrity Issues:: Incisions Incisions: closed rt hip, rt thigh  Last BM:  10/02/21  Height:   Ht Readings from Last 1 Encounters:  09/26/21 5' 3.5" (1.613 m)    Weight:   Wt Readings from Last 1 Encounters:  09/26/21 55.3 kg    Ideal Body Weight:  53.4 kg  BMI:  Body mass index is 21.27 kg/m.  Estimated Nutritional Needs:   Kcal:  8144-8185  Protein:  95-110 grams  Fluid:  > 1.7 L    Loistine Chance, RD, LDN, [place] Registered Dietitian II Certified Diabetes Care and Education Specialist Please refer to [facility] for RD and/or RD on-call/weekend/after hours pager

## 2021-10-03 NOTE — Progress Notes (Signed)
Subjective:  POD #7 s/p intramedullary fixation for right subtrochanteric femur fracture.  Patient is up out of bed to a chair.  Her daughter is at the bedside.  Patient reports right hip pain as mild.  [facility] is being [facility] today.  Objective:   VITALS:   Vitals:   10/02/21 1657 10/02/21 1923 10/03/21 0351 10/03/21 0800  BP: (!) 117/50 (!) 121/52 130/66 128/71  Pulse: 75 80 95 79  Resp: '14 16 15 16  '$ Temp: 98.2 F (36.8 C) 98.6 F (37 C) 98.4 F (36.9 C) 97.9 F (36.6 C)  TempSrc:    Oral  SpO2: 96% 99% 99% 97%  Weight:      Height:        PHYSICAL EXAM: Right lower extremity: Dressing with moderate serous drainage.  Compartments are soft and compressible in the thigh and right lower leg.  Patient is palpable pedal pulses, intact station light touch and intact motor function distally.   LABS  Results for orders placed or performed during the hospital encounter of 09/26/21 (from the past 24 hour(s))  Glucose, capillary     Status: Abnormal   Collection Time: 10/02/21  4:59 PM  Result Value Ref Range   Glucose-Capillary 257 (H) 70 - 99 mg/dL  Glucose, capillary     Status: Abnormal   Collection Time: 10/02/21  8:45 PM  Result Value Ref Range   Glucose-Capillary 199 (H) 70 - 99 mg/dL  Glucose, capillary     Status: Abnormal   Collection Time: 10/02/21 10:13 PM  Result Value Ref Range   Glucose-Capillary 148 (H) 70 - 99 mg/dL  CBC     Status: Abnormal   Collection Time: 10/03/21  3:00 AM  Result Value Ref Range   WBC 10.5 4.0 - 10.5 K/uL   RBC 3.22 (L) 3.87 - 5.11 MIL/uL   Hemoglobin 9.0 (L) 12.0 - 15.0 g/dL   HCT 26.2 (L) 36.0 - 46.0 %   MCV 81.4 80.0 - 100.0 fL   MCH 28.0 26.0 - 34.0 pg   MCHC 34.4 30.0 - 36.0 g/dL   RDW 21.3 (H) 11.5 - 15.5 %   Platelets 353 150 - 400 K/uL   nRBC 0.0 0.0 - 0.2 %  Basic metabolic panel     Status: Abnormal   Collection Time: 10/03/21  3:00 AM  Result Value Ref Range   Sodium 141 135 - 145 mmol/L    Potassium 4.8 3.5 - 5.1 mmol/L   Chloride 116 (H) 98 - 111 mmol/L   CO2 22 22 - 32 mmol/L   Glucose, Bld 185 (H) 70 - 99 mg/dL   BUN 48 (H) 8 - 23 mg/dL   Creatinine, Ser 1.26 (H) 0.44 - 1.00 mg/dL   Calcium 8.8 (L) 8.9 - 10.3 mg/dL   GFR, Estimated 44 (L) >60 mL/min   Anion gap 3 (L) 5 - 15  Glucose, capillary     Status: Abnormal   Collection Time: 10/03/21  7:41 AM  Result Value Ref Range   Glucose-Capillary 144 (H) 70 - 99 mg/dL  Glucose, capillary     Status: Abnormal   Collection Time: 10/03/21 11:40 AM  Result Value Ref Range   Glucose-Capillary 148 (H) 70 - 99 mg/dL    No results found.  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  Patient is weightbearing as tolerated.  Her hemoglobin is stable.  Vital signs are stable.  Patient will follow-up in the office in 7 to 10 days.  Continue Lovenox daily until follow-up.    Thornton Park , MD 10/03/2021, 2:24 PM

## 2021-10-03 NOTE — TOC Transition Note (Signed)
Transition of Care [facility]) - CM/SW Discharge Note   Patient Details  Name: Nancy Rodriguez MRN: [id number] Date of Birth: 06/07/1945  Transition of Care [facility]) CM/SW Contact:  Eileen Stanford, LCSW Phone Number: 10/03/2021, 1:49 PM   Clinical Narrative:   Clinical Social Worker facilitated patient discharge including contacting patient family and facility to confirm patient discharge plans.  Clinical information faxed to facility and family agreeable with plan.  CSW arranged ambulance transport via [facility] to [facility] (room 805) .  RN to call [phone] for report prior to discharge.   Daughter is aware to take home meds to SNF. CSW confirmed with [facility] Supervisor Andreas Newport that [facility] as she is her own [employer] planner. Also we have notified one family member (daughter, Nancy Rodriguez, present at bedside)- Nancy Rodriguez is also agreeable.     Final next level of care: Skilled Nursing Facility Barriers to Discharge: No Barriers Identified   Patient Goals and CMS Choice Patient states their goals for this hospitalization and ongoing recovery are:: for pt to get better   Choice offered to / list presented to : Adult Children  Discharge Placement              Patient chooses bed at:  ([facility] (room 805)) Patient to be transferred to facility by: [facility] Name of family member notified: daughter Nancy Rodriguez at bedside Patient and family notified of of transfer: 10/03/21  Discharge Plan and Services In-house Referral: Clinical Social Work   Post Acute Care Choice: [place]                               Social Determinants of Health (SDOH) Interventions     Readmission Risk Interventions     No data to display

## 2021-10-03 NOTE — Progress Notes (Signed)
Physical Therapy Treatment Patient Details Name: Nancy Rodriguez MRN: [id number] DOB: Mar 23, 1946 Today's Date: 10/03/2021   History of Present Illness Nancy Rodriguez is a 25yoF who [facility] s/p fall, subtrochanteric femur fracture seen, taken to OR for IM fixation c Dr. Mack Guise on 6/13. Pt inititally in ED on 6/6 after fall with subsequent thigh pain upon weightbearing, tripped over a stationary bike at home, also hit her head and had subsequent AMS. PMH: CHF,RA, amyloidosis, , HTN, DM2, CKD3a. protein-calorie malnutrition. Postoperatively WBAT on Rt. Pt uses either a SPC or RW at baseline. Postop Hb down from 10.8 to 7.4, received some blood. Per DTR pt was AMB ad lib quite well, taking part in cardiac rehab, baseline mentation WNL.    PT Comments    Pt was pleasant and motivated to participate during the session and put forth good effort throughout. Pt able to complete supine to sit w/ minA for trunk control and verbal cuing to UE placement to handrails. Pt able to complete sit to stand transfer of varying level surfaces w/ minA to initiate and extra time and effort to complete. Pt was able ambulate 50f to bathroom and back to recliner 563fusing RW w/ CGA with extra time and effort to complete and cuing for sequencing for step to pattern. Pt will benefit from PT services in a SNF setting upon discharge to safely address deficits listed in patient problem list for decreased caregiver assistance and eventual return to PLOF.    Recommendations for follow up therapy are one component of a multi-disciplinary discharge planning process, led by the attending physician.  Recommendations may be updated based on patient status, additional functional criteria and insurance authorization.  Follow Up Recommendations  Skilled nursing-short term rehab (<3 hours/day)     Assistance Recommended at Discharge Intermittent Supervision/Assistance  Patient can return home with the following A  little help with walking and/or transfers;A little help with bathing/dressing/bathroom;Assistance with cooking/housework;Assist for transportation;Help with stairs or ramp for entrance   Equipment Recommendations  None recommended by PT    Recommendations for Other Services       Precautions / Restrictions Precautions Precautions: Fall Restrictions Weight Bearing Restrictions: Yes RLE Weight Bearing: Weight bearing as tolerated     Mobility  Bed Mobility Overal bed mobility: Needs Assistance Bed Mobility: Supine to Sit     Supine to sit: Min assist     General bed mobility comments: minA for trunk control    Transfers Overall transfer level: Needs assistance Equipment used: Rolling walker (2 wheels) Transfers: Sit to/from Stand, Bed to chair/wheelchair/BSC Sit to Stand: Min assist           General transfer comment: verbal cuing for hand placement and minA to initiate stand.    Ambulation/Gait Ambulation/Gait assistance: Min guard Gait Distance (Feet): 10 Feet; 5 Feet x1 Assistive device: Rolling walker (2 wheels) Gait Pattern/deviations: Step-to pattern, Decreased step length - right, Decreased step length - left, Decreased stance time - right Gait velocity: decreased     General Gait Details: slow antaligic gait w/ no LOB; verbal cuing for sequencing of step to pattern   Stairs             Wheelchair Mobility    Modified Rankin (Stroke Patients Only)       Balance Overall balance assessment: Needs assistance Sitting-balance support: Feet supported, Bilateral upper extremity supported Sitting balance-Leahy Scale: Good     Standing balance support: Bilateral upper extremity supported, Reliant on  assistive device for balance, During functional activity Standing balance-Leahy Scale: Fair                              Cognition Arousal/Alertness: Awake/alert Behavior During Therapy: WFL for tasks assessed/performed Overall  Cognitive Status: Within Functional Limits for tasks assessed                                          Exercises      General Comments        Pertinent Vitals/Pain Pain Assessment Pain Assessment: 0-10 Pain Score: 3  Pain Location: R hip Pain Descriptors / Indicators: Grimacing, Shooting, Tingling Pain Intervention(s): Monitored during session, Premedicated before session, Repositioned    Home Living                          Prior Function            PT Goals (current goals can now be found in the care plan section) Progress towards PT goals: Progressing toward goals    Frequency    7X/week      PT Plan Current plan remains appropriate    Co-evaluation              AM-PAC PT "6 Clicks" Mobility   Outcome Measure  Help needed turning from your back to your side while in a flat bed without using bedrails?: A Little Help needed moving from lying on your back to sitting on the side of a flat bed without using bedrails?: A Little Help needed moving to and from a bed to a chair (including a wheelchair)?: A Little Help needed standing up from a chair using your arms (e.g., wheelchair or bedside chair)?: A Little Help needed to walk in hospital room?: A Little Help needed climbing 3-5 steps with a railing? : A Lot 6 Click Score: 17    End of Session Equipment Utilized During Treatment: Gait belt Activity Tolerance: Patient tolerated treatment well Patient left: in chair;with chair alarm set;with call bell/phone within reach;with family/visitor present Nurse Communication: Mobility status PT Visit Diagnosis: Difficulty in walking, not elsewhere classified (R26.2);Other abnormalities of gait and mobility (R26.89);Other symptoms and signs involving the nervous system (R29.898);Repeated falls (R29.6);Muscle weakness (generalized) (M62.81)     Time: 4650-3546 PT Time Calculation (min) (ACUTE ONLY): 30 min  Charges:                         Turner Daniels, SPT  10/03/2021, 1:47 PM

## 2021-10-03 NOTE — Progress Notes (Signed)
Discharge note: Pt wheeled out by EMS and transported to facility. Belongings given to daughter.

## 2021-10-03 NOTE — Op Note (Signed)
09/26/2021  6:52 PM  PATIENT:  Nancy Rodriguez    PRE-OPERATIVE DIAGNOSIS:  Right Subtrochanteric femur fracture   POST-OPERATIVE DIAGNOSIS:  Same  PROCEDURE:  Intramedullary fixation of right subtrochanteric femur fracture   SURGEON:  Thornton Park, MD  ANESTHESIA:   spinal   EBL:  150cc  IMPLANT:  ZIMMER BIOMET AFFIXUS NAIL 66m x 3659mwith a 9085mag screw and distal interlocking screw 42 mm in length.  PREOPERATIVE INDICATIONS:  Nancy Rodriguez a  76 43o. female with a diagnosis of right subtrochanteric femur fracture with significant displacement who was recommended for intramedullary fixation.    The risks, benefits and alternatives were discussed with the patient and their family.  The risks include but are not limited to infection, bleeding requiring blood transfusion, nerve or blood vessel injury, malunion, nonunion, hardware prominence, hardware failure, leg length discrepancy or change in lower extremity rotation and need for further surgery including hardware removal with conversion to a total hip arthroplasty. Medical risks include but are not limited to DVT and pulmonary embolism, myocardial infarction, stroke, pneumonia, respiratory failure and death. The patient and their daughter understood these risks and wished to proceed with surgery.  OPERATIVE PROCEDURE:  The patient was brought to the operating room and placed in the supine position on the fracture table. The patient received spinal anesthesia.  A closed reduction was performed under C-arm guidance.  The fracture reduction was confirmed on both AP and lateral views. A time out was performed to verify the patient's name, date of birth, medical record number, correct site of surgery correct procedure to be performed. The timeout was also used to verify the patient received antibiotics and all appropriate instruments, implants and radiographic studies were available in the room. Once all in attendance were in  agreement, the case began. The patient was prepped and draped in a sterile fashion. The patient received preoperative antibiotics with 2 grams of kefzol IV.  An incision was made laterally at the level of the fracture. The IT band and vastus lateralis fascia were sharply incised in line with the femur.  Blunt dissection through the vastus lateralis was performed to access the subtrochanteric fracture.   The shaft of the femur was translated medially and reduced with a bone hook.   A fracture clamp was used to hold the reduction.    An incision was made proximal to the greater trochanter in line with the femur. A guidewire was placed over the tip of the greater trochanter and advanced by drill into the proximal femur to the level of the lesser trochanter.  Confirmation of the drill pin position was made on AP and lateral C-arm images.  The threaded guidepin was then overdrilled with the proximal femoral entry reamer.  A ball-tipped guidewire was then advanced down the intramedullary canal, across the fracture, and down the femoral shaft to the knee.  The ball tip guidewire's position was confirmed at both the knee and hip via C-arm imaging. A depth gauge was used to measure the length of the long nail to be used. It was measured to be 360 mm. The actual nail was then inserted into the proximal femur, across the fracture site and down the femoral shaft. Its position was confirmed on AP and lateral C-arm images.  The ball tip guidewire was removed.  Once the nail was completely seated, the drill guide for the lag screw was placed through the guide arm for the Affixus nail. A guidepin was then placed  through this drill guide and advanced through the lateral cortex of the femur, across the fracture site and into the femoral head achieving a tip apex distance of less than 25 mm. The length of the drill pin was measured to be 90 mm, and then the drill for the lag screw was advanced through the lateral cortex, across  the fracture site and up into the femoral head to the depth of the lag screw.  The lag screw was then advanced by hand into position across the fracture site into the femoral head. Its final position was confirmed on AP and lateral C-arm images.  The set screw in the top of the intramedullary rod was tightened by hand using a screwdriver.   The attention was then turned to placement of the distal interlocking screws. A perfect circle technique was used. Two small stab incisions were made over the distal interlocking screw holes.  A free hand technique was used to drill the distal interlocking holes.  The depth of the screw holes was measured with a depth gauge. A 69m screw was then advanced into position and tightened by hand. The static hole could not be successfully drilled as the drill bit kept skyving off the anterolateral cortex of the femur.  Therefore the decision was made to only place one distal screw.  Final C-arm images of the entire intramedullary construct were taken in both the AP and lateral planes.   The wounds were irrigated copiously and closed with 0 Vicryl for closure of the deep fascia and 2-0 Vicryl for subcutaneous closure. The skin was approximated with staples. A dry sterile dressing was applied. I was scrubbed and present the entire case and all sharp, sponge and instrument counts were correct at the conclusion of the case. Patient was transferred to a hospital bed and brought to PACU in stable condition.     KTimoteo Gaul MD

## 2021-10-04 LAB — TYPE AND SCREEN
ABO/RH(D): A POS
Antibody Screen: POSITIVE
Unit division: 0
Unit division: 0
Unit division: 0
Unit division: 0
Unit division: 0

## 2021-10-04 LAB — BPAM RBC
Blood Product Expiration Date: 202306292359
Blood Product Expiration Date: 202306302359
Blood Product Expiration Date: 202307132359
Blood Product Expiration Date: 202307162359
Blood Product Expiration Date: 202307162359
ISSUE DATE / TIME: 202306191109
Unit Type and Rh: 6200
Unit Type and Rh: 6200
Unit Type and Rh: 6200
Unit Type and Rh: 6200
Unit Type and Rh: 6200

## 2021-10-11 ENCOUNTER — Other Ambulatory Visit: Payer: Self-pay | Admitting: *Deleted

## 2021-10-11 DIAGNOSIS — E8581 Light chain (AL) amyloidosis: Secondary | ICD-10-CM

## 2021-10-11 MED ORDER — LENALIDOMIDE 10 MG PO CAPS: ORAL_CAPSULE | ORAL | 0 refills | Status: DC

## 2021-10-16 ENCOUNTER — Inpatient Hospital Stay: Payer: Medicare Other

## 2021-10-16 ENCOUNTER — Other Ambulatory Visit: Payer: Medicare Other

## 2021-10-16 ENCOUNTER — Ambulatory Visit: Payer: Medicare Other

## 2021-10-16 ENCOUNTER — Telehealth: Payer: Self-pay | Admitting: Oncology

## 2021-10-16 ENCOUNTER — Telehealth: Payer: Self-pay

## 2021-10-16 ENCOUNTER — Inpatient Hospital Stay: Payer: Medicare Other | Admitting: Oncology

## 2021-10-16 ENCOUNTER — Ambulatory Visit: Payer: Medicare Other | Admitting: Oncology

## 2021-10-16 NOTE — Telephone Encounter (Signed)
Just took care of this.

## 2021-10-16 NOTE — Telephone Encounter (Signed)
FYI

## 2021-10-16 NOTE — Telephone Encounter (Signed)
Call coming from [place] in regards to Revlemid prescription. Horris Latino states the [employer] # for the survey is coming back flagged.

## 2021-10-16 NOTE — Telephone Encounter (Signed)
Spoke with Tanya from [facility] who handles transportation to notify her of appts for pt, appts have been confimred.

## 2021-10-16 NOTE — Telephone Encounter (Signed)
Patient's daughter [name]) called in to report that patient is currently at a rehab facility and wanted to r/s her appointment that was today. All appointments have been moved to 7/14 per her request.

## 2021-10-18 ENCOUNTER — Emergency Department: Payer: Medicare Other

## 2021-10-18 ENCOUNTER — Telehealth: Payer: Self-pay

## 2021-10-18 ENCOUNTER — Other Ambulatory Visit: Payer: Self-pay

## 2021-10-18 ENCOUNTER — Inpatient Hospital Stay
Admission: EM | Admit: 2021-10-18 | Discharge: 2021-10-24 | DRG: 442 | Disposition: A | Discharge status: Hospice | Payer: Medicare Other | Attending: Internal Medicine | Admitting: Internal Medicine

## 2021-10-18 DIAGNOSIS — K766 Portal hypertension: Principal | ICD-10-CM | POA: Diagnosis present

## 2021-10-18 DIAGNOSIS — D472 Monoclonal gammopathy: Secondary | ICD-10-CM | POA: Diagnosis present

## 2021-10-18 DIAGNOSIS — Z7901 Long term (current) use of anticoagulants: Secondary | ICD-10-CM

## 2021-10-18 DIAGNOSIS — K7210 Chronic hepatic failure without coma: Secondary | ICD-10-CM | POA: Diagnosis present

## 2021-10-18 DIAGNOSIS — E440 Moderate protein-calorie malnutrition: Secondary | ICD-10-CM | POA: Diagnosis present

## 2021-10-18 DIAGNOSIS — K77 Liver disorders in diseases classified elsewhere: Secondary | ICD-10-CM | POA: Diagnosis present

## 2021-10-18 DIAGNOSIS — Z808 Family history of malignant neoplasm of other organs or systems: Secondary | ICD-10-CM

## 2021-10-18 DIAGNOSIS — E119 Type 2 diabetes mellitus without complications: Secondary | ICD-10-CM

## 2021-10-18 DIAGNOSIS — I130 Hypertensive heart and chronic kidney disease with heart failure and stage 1 through stage 4 chronic kidney disease, or unspecified chronic kidney disease: Secondary | ICD-10-CM | POA: Diagnosis present

## 2021-10-18 DIAGNOSIS — E1122 Type 2 diabetes mellitus with diabetic chronic kidney disease: Secondary | ICD-10-CM | POA: Diagnosis present

## 2021-10-18 DIAGNOSIS — N1832 Chronic kidney disease, stage 3b: Secondary | ICD-10-CM | POA: Diagnosis present

## 2021-10-18 DIAGNOSIS — W19XXXD Unspecified fall, subsequent encounter: Secondary | ICD-10-CM | POA: Diagnosis present

## 2021-10-18 DIAGNOSIS — R17 Unspecified jaundice: Principal | ICD-10-CM | POA: Diagnosis present

## 2021-10-18 DIAGNOSIS — M069 Rheumatoid arthritis, unspecified: Secondary | ICD-10-CM | POA: Diagnosis present

## 2021-10-18 DIAGNOSIS — R627 Adult failure to thrive: Secondary | ICD-10-CM | POA: Diagnosis present

## 2021-10-18 DIAGNOSIS — Z515 Encounter for palliative care: Secondary | ICD-10-CM

## 2021-10-18 DIAGNOSIS — Z79899 Other long term (current) drug therapy: Secondary | ICD-10-CM

## 2021-10-18 DIAGNOSIS — S7221XD Displaced subtrochanteric fracture of right femur, subsequent encounter for closed fracture with routine healing: Secondary | ICD-10-CM

## 2021-10-18 DIAGNOSIS — Z833 Family history of diabetes mellitus: Secondary | ICD-10-CM

## 2021-10-18 DIAGNOSIS — D500 Iron deficiency anemia secondary to blood loss (chronic): Secondary | ICD-10-CM

## 2021-10-18 DIAGNOSIS — D631 Anemia in chronic kidney disease: Secondary | ICD-10-CM | POA: Diagnosis present

## 2021-10-18 DIAGNOSIS — I5032 Chronic diastolic (congestive) heart failure: Secondary | ICD-10-CM | POA: Diagnosis present

## 2021-10-18 DIAGNOSIS — E8581 Light chain (AL) amyloidosis: Secondary | ICD-10-CM | POA: Diagnosis present

## 2021-10-18 DIAGNOSIS — E876 Hypokalemia: Secondary | ICD-10-CM | POA: Diagnosis not present

## 2021-10-18 DIAGNOSIS — N1831 Chronic kidney disease, stage 3a: Secondary | ICD-10-CM | POA: Diagnosis present

## 2021-10-18 DIAGNOSIS — E859 Amyloidosis, unspecified: Secondary | ICD-10-CM

## 2021-10-18 DIAGNOSIS — I10 Essential (primary) hypertension: Secondary | ICD-10-CM | POA: Diagnosis present

## 2021-10-18 DIAGNOSIS — S72321A Displaced transverse fracture of shaft of right femur, initial encounter for closed fracture: Secondary | ICD-10-CM

## 2021-10-18 DIAGNOSIS — R188 Other ascites: Secondary | ICD-10-CM | POA: Diagnosis present

## 2021-10-18 DIAGNOSIS — Z800 Family history of malignant neoplasm of digestive organs: Secondary | ICD-10-CM

## 2021-10-18 DIAGNOSIS — K862 Cyst of pancreas: Secondary | ICD-10-CM | POA: Diagnosis present

## 2021-10-18 DIAGNOSIS — E7800 Pure hypercholesterolemia, unspecified: Secondary | ICD-10-CM | POA: Diagnosis present

## 2021-10-18 DIAGNOSIS — R5381 Other malaise: Secondary | ICD-10-CM

## 2021-10-18 DIAGNOSIS — Z6821 Body mass index (BMI) 21.0-21.9, adult: Secondary | ICD-10-CM

## 2021-10-18 DIAGNOSIS — Z888 Allergy status to other drugs, medicaments and biological substances status: Secondary | ICD-10-CM

## 2021-10-18 DIAGNOSIS — Z886 Allergy status to analgesic agent status: Secondary | ICD-10-CM

## 2021-10-18 DIAGNOSIS — K8020 Calculus of gallbladder without cholecystitis without obstruction: Secondary | ICD-10-CM | POA: Diagnosis present

## 2021-10-18 DIAGNOSIS — E854 Organ-limited amyloidosis: Secondary | ICD-10-CM | POA: Diagnosis present

## 2021-10-18 DIAGNOSIS — Z66 Do not resuscitate: Secondary | ICD-10-CM | POA: Diagnosis not present

## 2021-10-18 LAB — COMPREHENSIVE METABOLIC PANEL
ALT: 44 U/L (ref 0–44)
AST: 47 U/L — ABNORMAL HIGH (ref 15–41)
Albumin: 2.4 g/dL — ABNORMAL LOW (ref 3.5–5.0)
Alkaline Phosphatase: 830 U/L — ABNORMAL HIGH (ref 38–126)
Anion gap: 10 (ref 5–15)
BUN: 30 mg/dL — ABNORMAL HIGH (ref 8–23)
CO2: 19 mmol/L — ABNORMAL LOW (ref 22–32)
Calcium: 8.8 mg/dL — ABNORMAL LOW (ref 8.9–10.3)
Chloride: 110 mmol/L (ref 98–111)
Creatinine, Ser: 0.98 mg/dL (ref 0.44–1.00)
GFR, Estimated: 59 mL/min — ABNORMAL LOW (ref >60)
Glucose, Bld: 132 mg/dL — ABNORMAL HIGH (ref 70–99)
Potassium: 4 mmol/L (ref 3.5–5.1)
Sodium: 139 mmol/L (ref 135–145)
Total Bilirubin: 11.6 mg/dL — ABNORMAL HIGH (ref 0.3–1.2)
Total Protein: 5.6 g/dL — ABNORMAL LOW (ref 6.5–8.1)

## 2021-10-18 LAB — AMMONIA: Ammonia: 36 umol/L — ABNORMAL HIGH (ref 9–35)

## 2021-10-18 LAB — CBC
HCT: 29.3 % — ABNORMAL LOW (ref 36.0–46.0)
Hemoglobin: 10.2 g/dL — ABNORMAL LOW (ref 12.0–15.0)
MCH: 28.3 pg (ref 26.0–34.0)
MCHC: 34.8 g/dL (ref 30.0–36.0)
MCV: 81.4 fL (ref 80.0–100.0)
Platelets: 565 10*3/uL — ABNORMAL HIGH (ref 150–400)
RBC: 3.6 MIL/uL — ABNORMAL LOW (ref 3.87–5.11)
RDW: 25.9 % — ABNORMAL HIGH (ref 11.5–15.5)
WBC: 7.2 10*3/uL (ref 4.0–10.5)
nRBC: 0 % (ref 0.0–0.2)

## 2021-10-18 LAB — LIPASE, BLOOD: Lipase: 24 U/L (ref 11–51)

## 2021-10-18 MED ORDER — IOHEXOL 300 MG/ML  SOLN
75.0000 mL | Freq: Once | INTRAMUSCULAR | Status: AC | PRN
  Administered 2021-10-18: 75 mL via INTRAVENOUS

## 2021-10-18 MED ORDER — IOHEXOL 300 MG/ML  SOLN: 75.0000 mL | Freq: Once | INTRAMUSCULAR | Status: DC | PRN

## 2021-10-18 NOTE — ED Provider Triage Note (Signed)
Emergency Medicine Provider Triage Evaluation Note  MEKIAH WAHLER , a 76 y.o. female  was evaluated in triage.  Pt complains of elevated ammonia and elevated bilirubin. 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 Recent admission for femoral fracture. Patient complains of knee pain only where incision made  Review of Systems  Positive: Knee pain Negative: Abdominal pain, n/v  Physical Exam  There were no vitals taken for this visit. Gen:   Awake, no distress   Resp:  Normal effort  MSK:   Moves extremities without difficulty  Other:    Medical Decision Making  Medically screening exam initiated at 4:10 PM.  Appropriate orders placed.  BETH GOODLIN was informed that the remainder of the evaluation will be completed by another provider, this initial triage assessment does not replace that evaluation, and the importance of remaining in the ED until their evaluation is complete.     Marquette Old, PA-C 10/18/21 1616

## 2021-10-18 NOTE — Telephone Encounter (Signed)
Liz Malady, NP calling from [facility] [place] regarding patient lab results LFT - bilirubin was as high as 12 and on 7/3 it was still 10.2  ammonia is 110  alk phos is 716  Started her on lactulose 30 mg BID  Can Dr Tasia Catchings call back at [phone]

## 2021-10-18 NOTE — Telephone Encounter (Signed)
Left message for daughter. Have not heard back but will discharge patient from CARE program at this time.

## 2021-10-18 NOTE — Progress Notes (Signed)
CARE Discharge Progress Report  Patient Details  Name: Nancy Rodriguez MRN: [id number] Date of Birth: Sep 11, 1945 Referring Provider:   [name] from 07/18/2021 in [facility]  Referring Provider [name] MD        Number of Visits: 14  Reason for Discharge:  Early Exit:  Medical/ Discharged from hospital getting rehab  Smoking History:  Social History   Tobacco Use  Smoking Status Never  Smokeless Tobacco Never    Diagnosis:  Amyloidosis, unspecified type ([place])  ADL [facility]:   Initial Exercise Prescription:  Initial Exercise Prescription - 07/18/21 1400       Date of Initial Exercise RX and Referring Provider   Date 07/18/21    Referring Provider [name] MD      Oxygen   Maintain Oxygen Saturation 88% or higher      NuStep   Level 1    SPM 80    Minutes 15    METs 1.4      Biostep-RELP   Level 1    SPM 50    Minutes 15    METs 1.4      Track   Laps 5    Minutes 15    METs 1.27      Prescription Details   Frequency (times per week) 2    Duration Progress to 30 minutes of continuous aerobic without signs/symptoms of physical distress      Intensity   THRR 40-80% of Max Heartrate 105 - 130    Ratings of Perceived Exertion 11-13    Perceived Dyspnea 0-4      Progression   Progression Continue to progress workloads to maintain intensity without signs/symptoms of physical distress.      Resistance Training   Training Prescription Yes    Weight 2 lb    Reps 10-15             Discharge Exercise Prescription (Final Exercise Prescription Changes):  Exercise Prescription Changes - 07/18/21 1400       Response to Exercise   Blood Pressure (Admit) 124/68    Blood Pressure (Exercise) 142/60    Blood Pressure (Exit) 122/64    Heart Rate (Admit) 81 bpm    Heart Rate (Exercise) 90 bpm    Heart Rate (Exit) 82 bpm    Oxygen Saturation (Admit) 98 %    Oxygen Saturation  (Exercise) 97 %    Oxygen Saturation (Exit) 98 %    Rating of Perceived Exertion (Exercise) 13    Perceived Dyspnea (Exercise) 0    Symptoms none    Comments step test results             Functional Capacity:  6 Minute Walk     Row Name 07/18/21 1438         6 Minute Walk   Phase Initial     Distance 321 feet  steps; T4 Step test     Walk Time 6 minutes     # of Rest Breaks 0     MPH --  Average 53 spm     METS 1.4     RPE 13     Perceived Dyspnea  0     VO2 Peak --  N/A     Symptoms No     Resting HR 81 bpm     Resting BP 124/68     Resting Oxygen Saturation  98 %  Exercise Oxygen Saturation  during 6 min walk 97 %     Max Ex. HR 90 bpm     Max Ex. BP 142/60     2 Minute Post BP 122/64               Pre Biometrics - 07/18/21 1437       Pre Biometrics   Height '5\' 2"'$  (1.575 m)    Weight 123 lb 14.4 oz (56.2 kg)    BMI (Calculated) 22.66    Single Leg Stand 0 seconds               Goals reviewed with patient; copy given to patient.

## 2021-10-18 NOTE — ED Provider Notes (Signed)
[facility] Provider Note    Event Date/Time   First MD Initiated Contact with Patient 10/18/21 2132     (approximate)   History   Abnormal Lab   HPI {Remember to add pertinent medical, surgical, social, and/or OB history to HPI:1} Nancy Rodriguez is a 76 y.o. female  ***       Physical Exam   Triage Vital Signs: ED Triage Vitals [10/18/21 1823]  Enc Vitals Group     BP (!) 147/73     Pulse Rate 74     Resp 16     Temp 97.7 F (36.5 C)     Temp Source Oral     SpO2 100 %     Weight      Height      Head Circumference      Peak Flow      Pain Score      Pain Loc      Pain Edu?      Excl. in [place]?     Most recent vital signs: Vitals:   10/18/21 1823 10/18/21 2120  BP: (!) 147/73 (!) 162/69  Pulse: 74 84  Resp: 16 16  Temp: 97.7 F (36.5 C) 97.7 F (36.5 C)  SpO2: 100% 100%    {Only need to document appropriate and relevant physical exam:1} General: Awake, no distress. *** CV:  Good peripheral perfusion. *** Resp:  Normal effort. *** Abd:  No distention. *** Other:  ***   ED Results / Procedures / Treatments   Labs (all labs ordered are listed, but only abnormal results are displayed) Labs Reviewed  CBC - Abnormal; Notable for the following components:      Result Value   RBC 3.60 (*)    Hemoglobin 10.2 (*)    HCT 29.3 (*)    RDW 25.9 (*)    Platelets 565 (*)    All other components within normal limits  COMPREHENSIVE METABOLIC PANEL - Abnormal; Notable for the following components:   CO2 19 (*)    Glucose, Bld 132 (*)    BUN 30 (*)    Calcium 8.8 (*)    Total Protein 5.6 (*)    Albumin 2.4 (*)    AST 47 (*)    Alkaline Phosphatase 830 (*)    Total Bilirubin 11.6 (*)    GFR, Estimated 59 (*)    All other components within normal limits  LIPASE, BLOOD  URINALYSIS, ROUTINE W REFLEX MICROSCOPIC     EKG  ***   RADIOLOGY *** {USE THE WORD "INTERPRETED"!! You MUST document your own interpretation of  imaging, as well as the fact that you reviewed the radiologist's report!:1}   PROCEDURES:  Critical Care performed: {CriticalCareYesNo:19197::"Yes, see critical care procedure note(s)","No"}  Procedures   MEDICATIONS ORDERED IN ED: Medications  iohexol (OMNIPAQUE) 300 MG/ML solution 75 mL (75 mLs Intravenous Contrast Given 10/18/21 1752)     IMPRESSION / MDM / [place] / ED COURSE  I reviewed the triage vital signs and the nursing notes.                              Differential diagnosis includes, but is not limited to, ***  Patient's presentation is most consistent with {EM COPA:27473}  {If the patient is on the monitor, remove the brackets and asterisks on the sentence below and remember to document it as a Procedure as well. Otherwise  delete the sentence below:1} {**The patient is on the cardiac monitor to evaluate for evidence of arrhythmia and/or significant heart rate changes.**} {Remember to include, when applicable, any/all of the following data: independent review of imaging independent review of labs (comment specifically on pertinent positives and negatives) review of specific prior hospitalizations, PCP/specialist notes, etc. discuss meds given and prescribed document any discussion with consultants (including hospitalists) any clinical decision tools you used and why (PECARN, NEXUS, etc.) did you consider admitting the patient? document social determinants of health affecting patient's care (homelessness, inability to follow up in a timely fashion, etc) document any pre-existing conditions increasing risk on current visit (e.g. diabetes and HTN increasing danger of high-risk chest pain/ACS) describes what meds you gave (especially parenteral) and why any other interventions?:1}     FINAL CLINICAL IMPRESSION(S) / ED DIAGNOSES   Final diagnoses:  None     Rx / DC Orders   ED Discharge Orders     None        Note:  This document was  prepared using Dragon voice recognition software and may include unintentional dictation errors.

## 2021-10-18 NOTE — Telephone Encounter (Signed)
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 Tasia Catchings recommended ER for further evaluation. NP verbalized understanding and said she was going to call family and update them on plan.

## 2021-10-18 NOTE — ED Triage Notes (Signed)
Pt comes into the ED via EMS from [facility] with c/o abnormal labs, liver enzymes?. Pt is alert, no complaints, sclera yellow. VSS

## 2021-10-19 ENCOUNTER — Emergency Department: Payer: Medicare Other

## 2021-10-19 ENCOUNTER — Encounter: Payer: Self-pay | Admitting: Internal Medicine

## 2021-10-19 ENCOUNTER — Telehealth: Payer: Self-pay | Admitting: Oncology

## 2021-10-19 DIAGNOSIS — E854 Organ-limited amyloidosis: Secondary | ICD-10-CM

## 2021-10-19 DIAGNOSIS — R17 Unspecified jaundice: Secondary | ICD-10-CM | POA: Diagnosis present

## 2021-10-19 DIAGNOSIS — K77 Liver disorders in diseases classified elsewhere: Secondary | ICD-10-CM | POA: Diagnosis not present

## 2021-10-19 LAB — GLUCOSE, CAPILLARY
Glucose-Capillary: 91 mg/dL (ref 70–99)
Glucose-Capillary: 83 mg/dL (ref 70–99)
Glucose-Capillary: 90 mg/dL (ref 70–99)
Glucose-Capillary: 92 mg/dL (ref 70–99)
Glucose-Capillary: 145 mg/dL — ABNORMAL HIGH (ref 70–99)

## 2021-10-19 LAB — HEPATIC FUNCTION PANEL
ALT: 37 U/L (ref 0–44)
AST: 33 U/L (ref 15–41)
Albumin: 2.3 g/dL — ABNORMAL LOW (ref 3.5–5.0)
Alkaline Phosphatase: 721 U/L — ABNORMAL HIGH (ref 38–126)
Bilirubin, Direct: 6.2 mg/dL — ABNORMAL HIGH (ref 0.0–0.2)
Indirect Bilirubin: 4.3 mg/dL — ABNORMAL HIGH (ref 0.3–0.9)
Total Bilirubin: 10.5 mg/dL — ABNORMAL HIGH (ref 0.3–1.2)
Total Protein: 5.2 g/dL — ABNORMAL LOW (ref 6.5–8.1)

## 2021-10-19 LAB — PROTIME-INR
INR: 1.2 (ref 0.8–1.2)
Prothrombin Time: 15 seconds (ref 11.4–15.2)

## 2021-10-19 LAB — APTT: aPTT: 36 seconds (ref 24–36)

## 2021-10-19 MED ORDER — SERTRALINE HCL 50 MG PO TABS
50.0000 mg | ORAL_TABLET | Freq: Every day | ORAL | Status: DC
  Administered 2021-10-20 – 2021-10-24 (×4): 50 mg via ORAL
  Filled 2021-10-19 (×5): qty 1

## 2021-10-19 MED ORDER — GADOBUTROL 1 MMOL/ML IV SOLN
5.0000 mL | Freq: Once | INTRAVENOUS | Status: AC | PRN
Start: 2021-10-19 — End: 2021-10-19
  Administered 2021-10-19: 5 mL via INTRAVENOUS

## 2021-10-19 MED ORDER — INSULIN ASPART 100 UNIT/ML IJ SOLN
0.0000 [IU] | Freq: Three times a day (TID) | INTRAMUSCULAR | Status: DC
  Administered 2021-10-19 – 2021-10-20 (×3): 1 [IU] via SUBCUTANEOUS
  Administered 2021-10-20: 2 [IU] via SUBCUTANEOUS
  Administered 2021-10-20 – 2021-10-21 (×3): 1 [IU] via SUBCUTANEOUS
  Administered 2021-10-21: 2 [IU] via SUBCUTANEOUS
  Administered 2021-10-21: 1 [IU] via SUBCUTANEOUS
  Filled 2021-10-19 (×9): qty 1

## 2021-10-19 MED ORDER — MORPHINE SULFATE (PF) 2 MG/ML IV SOLN: 2.0000 mg | INTRAVENOUS | Status: DC | PRN

## 2021-10-19 MED ORDER — HYDRALAZINE HCL 20 MG/ML IJ SOLN: 5.0000 mg | INTRAMUSCULAR | Status: DC | PRN

## 2021-10-19 MED ORDER — ACETAMINOPHEN 650 MG RE SUPP
650.0000 mg | Freq: Four times a day (QID) | RECTAL | Status: DC | PRN
Start: 2021-10-19 — End: 2021-10-19

## 2021-10-19 MED ORDER — METOPROLOL SUCCINATE ER 50 MG PO TB24
50.0000 mg | ORAL_TABLET | Freq: Every day | ORAL | Status: DC
  Administered 2021-10-20 – 2021-10-21 (×2): 50 mg via ORAL
  Filled 2021-10-19 (×3): qty 1

## 2021-10-19 MED ORDER — OXYCODONE HCL 5 MG PO TABS: 5.0000 mg | ORAL_TABLET | Freq: Four times a day (QID) | ORAL | Status: DC | PRN

## 2021-10-19 MED ORDER — LENALIDOMIDE 10 MG PO CAPS: 10.0000 mg | ORAL_CAPSULE | Freq: Every day | ORAL | Status: DC

## 2021-10-19 MED ORDER — ONDANSETRON HCL 4 MG PO TABS: 4.0000 mg | ORAL_TABLET | Freq: Four times a day (QID) | ORAL | Status: DC | PRN

## 2021-10-19 MED ORDER — INSULIN ASPART 100 UNIT/ML IJ SOLN: 0.0000 [IU] | INTRAMUSCULAR | Status: DC

## 2021-10-19 MED ORDER — AMLODIPINE BESYLATE 5 MG PO TABS
5.0000 mg | ORAL_TABLET | Freq: Every day | ORAL | Status: DC
  Administered 2021-10-20 – 2021-10-21 (×2): 5 mg via ORAL
  Filled 2021-10-19 (×3): qty 1

## 2021-10-19 MED ORDER — ACETAMINOPHEN 325 MG PO TABS: 650.0000 mg | ORAL_TABLET | Freq: Four times a day (QID) | ORAL | Status: DC | PRN

## 2021-10-19 MED ORDER — HYDROCODONE-ACETAMINOPHEN 5-325 MG PO TABS: 1.0000 | ORAL_TABLET | ORAL | Status: DC | PRN

## 2021-10-19 MED ORDER — ONDANSETRON HCL 4 MG/2ML IJ SOLN: 4.0000 mg | Freq: Four times a day (QID) | INTRAMUSCULAR | Status: DC | PRN

## 2021-10-19 NOTE — Telephone Encounter (Signed)
PT son called, in regards to visit Dr.YU made with pt while admitted in hospital, he is wanting to have a follow up phone call.Marland KitchenKJ

## 2021-10-19 NOTE — Assessment & Plan Note (Signed)
Hemoglobin 10.2, slightly improved from baseline

## 2021-10-19 NOTE — Assessment & Plan Note (Signed)
Stable on CT

## 2021-10-19 NOTE — Consult Note (Addendum)
Hematology/Oncology Consult note [phone] [phone]      Patient Care Team: Mick Sell, MD as PCP - General (Infectious Diseases) End, Cristal Deer, MD as PCP - Cardiology (Cardiology) Rickard Patience, MD as Consulting Physician (Oncology)   Name of the patient: Nancy Rodriguez  [id number]  Apr 24, 1945   Date of visit: 10/19/21 REASON FOR COSULTATION:  Elevated bilirubin History of presenting illness-  76 y.o. 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 Patient has been on 09/18/2021. Patient has amyloidosis liver, chronic liver function impairment with a baseline bilirubin around 4. Patient has recently been admitted to rehab and her lab work there showed a bilirubin level of 11. She had a fall and was admitted on 09/26/2021 due to right displaced subtrochanteric proximal femur fracture. patient had intramedullary fixation of right subtrochanteric femur fracture.  Patient denies any recent infection.  Denies any pain, nausea vomiting.  Appetite is very poor.  Amyloidosis labs done at her last visit showed increase of light chain ratio 2.15, slightly worse than a few months ago.  M protein not observed.  Patient has been on daratumumab Revlimid dexamethasone treatment and amyloidosis: Has responded well to the treatments however her liver function has not responded, likely due to irreversible damages from amyloidosis.  Review of Systems  Constitutional:  Positive for appetite change, fatigue and unexpected weight change. Negative for chills and fever.  HENT:   Negative for hearing loss and voice change.   Eyes:  Negative for eye problems.  Respiratory:  Negative for chest tightness and cough.   Cardiovascular:  Negative for chest pain.  Gastrointestinal:  Negative for abdominal distention, abdominal pain and  blood in stool.  Endocrine: Negative for hot flashes.  Genitourinary:  Negative for difficulty urinating, frequency and vaginal bleeding.   Musculoskeletal:  Negative for arthralgias.  Skin:  Positive for itching. Negative for rash.  Neurological:  Negative for extremity weakness.  Hematological:  Negative for adenopathy.  Psychiatric/Behavioral:  Negative for confusion.     Allergies  Allergen Reactions   Benazepril Anaphylaxis and Swelling    TONGUE AND LIPS     Lisinopril Swelling   Tolmetin Swelling   Nsaids Swelling    Patient Active Problem List   Diagnosis Date Noted   Amyloidosis (HCC) 05/20/2020    Priority: High   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    Priority: Medium    IDA (iron deficiency anemia) 02/16/2021    Priority: Medium    Goals of care, counseling/discussion 07/20/2020    Priority: Medium    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HCC) 07/06/2020    Priority: Medium    Encounter for antineoplastic chemotherapy 06/17/2020    Priority: Medium    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    Priority: Low   Hyperglycemia 06/17/2020    Priority: Low   Elevated bilirubin 10/19/2021   Malnutrition of moderate degree 09/28/2021   AKI (acute kidney injury) (HCC) 09/28/2021   Acute postoperative anemia due to expected blood loss 09/27/2021   Delirium due to multiple etiologies, persistent, hypoactive 09/27/2021   Hypertensive urgency 09/26/2021   Diabetes mellitus without complication (HCC)    Chronic kidney disease, stage 3a (HCC)    Fracture of femoral shaft, right, closed (HCC)    Subdural hematoma (HCC) 05/18/2021   Hypoalbuminemia 04/27/2021   Vaginal bleeding 04/27/2021   Hepatic cirrhosis (HCC) 04/27/2021  Hyperbilirubinemia 04/27/2021   Gastroesophageal reflux disease 11/10/2020   Lower extremity edema 10/14/2020   Elevated LFTs 10/12/2020   Loss of weight 10/12/2020   Anasarca 09/04/2020   Edema 08/13/2020   Multiple myeloma ([place]) 08/13/2020    Chemotherapy induced nausea and vomiting 07/27/2020   Hypokalemia 07/27/2020   Microcytic anemia 07/06/2020   Abnormal EKG 07/01/2020   Essential hypertension 07/01/2020   Hyperlipidemia associated with type 2 diabetes mellitus ([place]) 07/01/2020   Transaminitis 06/17/2020     Past Medical History:  Diagnosis Date   (HFpEF) heart failure with preserved ejection fraction ([place])    a. 05/2020 Echo: EF 60-65%; b. 08/2020 cMRI ([facility]): EF 71%, no delayed hyperenhancement to suggest scar/infiltrative dzs. Nl RV fxn. Mild BAE. Triv MR, mild TR; c. 06/2021 Echo: EF 50-55%, no rwma, mild basal-septal LVH, GrII DD. Nl RV size/fxn. Mild MR. Ao sclerosis.   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 03/2012 Stress Echo([facility]): nl study.   Hypercholesterolemia    Hypertension    MGUS (monoclonal gammopathy of unknown significance)    Osteoarthritis    Rheumatoid arthritis ([place])      Past Surgical History:  Procedure Laterality Date   HERNIA REPAIR     INTRAMEDULLARY (IM) NAIL INTERTROCHANTERIC Right 09/26/2021   Procedure: INTRAMEDULLARY (IM) NAIL INTERTROCHANTRIC;  Surgeon: Thornton Park, MD;  Location: [facility] ORS;  Service: Orthopedics;  Laterality: Right;   PARATHYROIDECTOMY      Social History   Socioeconomic History   Marital status: Widowed    Spouse name: Not on file   Number of children: 6   Years of education: Not on file   Highest education level: Not on file  Occupational History   Not on file  Tobacco Use   Smoking status: Never   Smokeless tobacco: Never  Vaping Use   Vaping Use: Never used  Substance and Sexual Activity   Alcohol use: No   Drug use: Never   Sexual activity: Not on file  Other Topics Concern   Not on file  Social History Narrative   Not on file   Social  Determinants of Health   Financial Resource Strain: Not on file  Food Insecurity: Not on file  Transportation Needs: Not on file  Physical Activity: Not on file  Stress: Not on file  Social Connections: Not on file  Intimate Partner Violence: Not on file     Family History  Problem Relation Age of Onset   Diabetes Daughter    Diabetes Son    Dementia Mother    Cancer Father    Esophageal cancer Sister    Brain cancer Brother      Current Facility-Administered Medications:    acetaminophen (TYLENOL) tablet 650 mg, 650 mg, Oral, Q6H PRN **OR** acetaminophen (TYLENOL) suppository 650 mg, 650 mg, Rectal, Q6H PRN, Athena Masse, MD   amLODipine (NORVASC) tablet 5 mg, 5 mg, Oral, Daily, Judd Gaudier V, MD   hydrALAZINE (APRESOLINE) injection 5 mg, 5 mg, Intravenous, Q4H PRN, Athena Masse, MD   HYDROcodone-acetaminophen (NORCO/VICODIN) 5-325 MG per tablet 1-2 tablet, 1-2 tablet, Oral, Q4H PRN, Athena Masse, MD   insulin aspart (novoLOG) injection 0-9 Units, 0-9 Units, Subcutaneous, Q4H, Athena Masse, MD   lenalidomide (REVLIMID)  capsule 10 mg, 10 mg, Oral, Daily, Athena Masse, MD   metoprolol succinate (TOPROL-XL) 24 hr tablet 50 mg, 50 mg, Oral, Daily, Judd Gaudier V, MD   morphine (PF) 2 MG/ML injection 2 mg, 2 mg, Intravenous, Q2H PRN, Athena Masse, MD   ondansetron (ZOFRAN) tablet 4 mg, 4 mg, Oral, Q6H PRN **OR** ondansetron (ZOFRAN) injection 4 mg, 4 mg, Intravenous, Q6H PRN, Athena Masse, MD   sertraline (ZOLOFT) tablet 50 mg, 50 mg, Oral, Daily, Athena Masse, MD   Physical exam:  Vitals:   10/19/21 0106 10/19/21 0227 10/19/21 0409 10/19/21 0734  BP: (!) 155/71 (!) 170/72 (!) 158/76 (!) 149/72  Pulse: 66 68 66 69  Resp: $Remo'15 18 18 18  'IXFfw$ Temp:  98.1 F (36.7 C) 97.9 F (36.6 C) 98.5 F (36.9 C)  TempSrc:      SpO2: 100% 100% 100% 100%   Physical Exam Constitutional:      General: She is not in acute distress.    Appearance: She is ill-appearing.  She is not diaphoretic.  HENT:     Head: Normocephalic and atraumatic.     Nose: Nose normal.     Mouth/Throat:     Pharynx: No oropharyngeal exudate.  Eyes:     General: Scleral icterus present.     Pupils: Pupils are equal, round, and reactive to light.  Cardiovascular:     Rate and Rhythm: Normal rate and regular rhythm.     Heart sounds: No murmur heard. Pulmonary:     Effort: Pulmonary effort is normal. No respiratory distress.     Breath sounds: No rales.  Chest:     Chest wall: No tenderness.  Abdominal:     General: There is distension.     Palpations: Abdomen is soft.  Musculoskeletal:        General: Normal range of motion.     Cervical back: Normal range of motion and neck supple.  Skin:    General: Skin is warm and dry.     Coloration: Skin is jaundiced.     Findings: No erythema.  Neurological:     Mental Status: She is alert and oriented to person, place, and time.     Cranial Nerves: No cranial nerve deficit.     Motor: No abnormal muscle tone.     Coordination: Coordination normal.  Psychiatric:        Mood and Affect: Mood and affect normal.      Labs     Latest Ref Rng & Units 10/18/2021    4:06 PM 10/03/2021    3:00 AM 10/02/2021    6:00 AM  CBC  WBC 4.0 - 10.5 K/uL 7.2  10.5  9.7   Hemoglobin 12.0 - 15.0 g/dL 10.2  9.0  7.7   Hematocrit 36.0 - 46.0 % 29.3  26.2  22.0   Platelets 150 - 400 K/uL 565  353  353       Latest Ref Rng & Units 10/19/2021    8:36 AM 10/18/2021    4:06 PM 10/03/2021    3:00 AM  CMP  Glucose 70 - 99 mg/dL  132  185   BUN 8 - 23 mg/dL  30  48   Creatinine 0.44 - 1.00 mg/dL  0.98  1.26   Sodium 135 - 145 mmol/L  139  141   Potassium 3.5 - 5.1 mmol/L  4.0  4.8   Chloride 98 - 111 mmol/L  110  116  CO2 22 - 32 mmol/L  19  22   Calcium 8.9 - 10.3 mg/dL  8.8  8.8   Total Protein 6.5 - 8.1 g/dL 5.2  5.6    Total Bilirubin 0.3 - 1.2 mg/dL 10.5  11.6    Alkaline Phos 38 - 126 U/L 721  830    AST 15 - 41 U/L 33  47    ALT 0 -  44 U/L 37  44           RADIOGRAPHIC STUDIES: I have personally reviewed the radiological images as listed and agreed with the findings in the report. MR ABDOMEN MRCP W WO CONTAST  Result Date: 10/19/2021 CLINICAL DATA:  Elevated bilirubin levels.  History of cirrhosis EXAM: MRI ABDOMEN WITHOUT AND WITH CONTRAST (INCLUDING MRCP) TECHNIQUE: Multiplanar multisequence MR imaging of the abdomen was performed both before and after the administration of intravenous contrast. Heavily T2-weighted images of the biliary and pancreatic ducts were obtained, and three-dimensional MRCP images were rendered by post processing. CONTRAST:  70mL GADAVIST GADOBUTROL 1 MMOL/ML IV SOLN COMPARISON:  CT AP 10/18/2021 FINDINGS: Lower chest: No acute findings. Hepatobiliary: The liver has a cirrhotic appearance with contour nodularity and hypertrophy of the caudate lobe and lateral segment of left hepatic lobe. Left lobe of liver cyst containing thin internal area of eccentric septation is noted measuring 1.3 cm. Heterogeneous enhancement is identified anterior right hepatic lobe. No definite focal enhancing liver lesions identified. There are multiple stones identified within the dependent portion of the gallbladder measuring up to 3 mm. Mild gallbladder wall thickening measures up to 3 mm which is nonspecific in the setting of cirrhosis and portal venous hypertension. There is no intrahepatic bile duct dilatation. The CBD is upper limits of normal in caliber measuring 6 mm. No choledocholithiasis identified Pancreas: No main duct dilatation or inflammation identified. Multiple cystic lesions are identified within the uncinate process, head and tail of pancreas. -Bilobed cyst containing a internal area of hairline septation measures 2.4 x 1.5 cm within the uncinate process, image 28/4. -Within the tail of pancreas there is a bilobed cystic structure containing a thin internal area of septation measuring 1.8 cm, image 25/4.  -Unilocular cyst within the anterior head of pancreas measures 7 mm, image 27/4. Spleen:  Within normal limits in size and appearance. Adrenals/Urinary Tract: Normal adrenal glands. No suspicious mass or hydronephrosis identified. Several small simple appearing cysts are noted. No follow-up imaging recommended. Stomach/Bowel: Visualized portions within the abdomen are unremarkable. Vascular/Lymphatic: Aortic atherosclerosis without aneurysm. No adenopathy. Other: There is a moderate volume of ascites identified within the upper abdomen. Musculoskeletal: No suspicious bone lesions identified. IMPRESSION: 1. Morphologic features of the liver compatible with cirrhosis. 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 2. Gallstones. Mild gallbladder wall thickening is nonspecific in the setting of cirrhosis and portal venous hypertension. 3. No signs of biliary ductal dilatation.  No choledocholithiasis. 4. Multiple cystic lesions are identified within the uncinate process, head and tail of pancreas. The largest measures 2.4 cm. Follow-up imaging with repeat MRI in 6 months is recommended. This recommendation follows ACR consensus guidelines: Management of Incidental Pancreatic Cysts: A White Paper of the ACR Incidental Findings Committee. J Am Coll Radiol 8315;17:616-073. 5.  Aortic Atherosclerosis (ICD10-I70.0). Electronically Signed   By: Kerby Moors M.D.   On: 10/19/2021 05:18   CT Abdomen Pelvis W Contrast  Result Date: 10/18/2021 CLINICAL DATA:  Elevated ammonia level and bilirubin level with painless jaundice, initial encounter EXAM:  CT ABDOMEN AND PELVIS WITH CONTRAST TECHNIQUE: Multidetector CT imaging of the abdomen and pelvis was performed using the standard protocol following bolus administration of intravenous contrast. RADIATION DOSE REDUCTION: This exam was performed according to the departmental dose-optimization program which includes automated exposure control, adjustment of the  mA and/or kV according to patient size and/or use of iterative reconstruction technique. CONTRAST:  53mL OMNIPAQUE IOHEXOL 300 MG/ML  SOLN COMPARISON:  MRI from 04/06/2021, ultrasound from 06/30/2021 FINDINGS: Lower chest: Lung bases are free of acute infiltrate or sizable effusion. Hepatobiliary: Gallbladder is well distended with multiple dependent gallstones. Mottled decreased attenuation is identified throughout the right lobe of the liver without discrete mass. These changes are felt to be related to progressive cirrhotic change. Cyst is again noted in the left lobe of the liver stable in appearance from the prior exam. Pancreas: Pancreas again demonstrates cystic lesions within the head and body of the pancreas stable in appearance from the prior exam. Spleen: Normal in size without focal abnormality. Adrenals/Urinary Tract: Adrenal glands are within normal limits. Kidneys demonstrate a normal enhancement pattern bilaterally. No renal calculi or obstructive changes are noted. Bladder is well distended. Stomach/Bowel: The appendix is within normal limits. No obstructive or inflammatory changes colon are seen. Small bowel and stomach are within limits. Vascular/Lymphatic: Aortic atherosclerosis. No enlarged abdominal or pelvic lymph nodes. Reproductive: Uterus and bilateral adnexa are unremarkable. Other: Moderate ascites is noted consistent with the underlying cirrhotic change. Musculoskeletal: Degenerative changes of the lumbar spine are noted. Right hip fixation is noted. Progressive L4 compression deformity is noted but appears chronic. IMPRESSION: Mottled enhancement within the right lobe of the liver likely related to the patient's known underlying cirrhosis. This may represent some regenerating nodules. Nonemergent MRI when the patient can tolerate adequate breath hold technique may be helpful. Cholelithiasis without complicating factors. Moderate ascites related to cirrhotic change. Cystic lesions within  the pancreas stable from prior exam. Electronically Signed   By: Inez Catalina M.D.   On: 10/18/2021 22:02   DG Wrist 2 Views Left  Result Date: 09/27/2021 CLINICAL DATA:  Left wrist pain after fall yesterday. EXAM: LEFT WRIST - 2 VIEW COMPARISON:  None available FINDINGS: There is 1 mm ulnar negative variance. Moderate to severe thumb carpometacarpal and moderate triscaphe joint space narrowing and peripheral osteophytosis. Mild-to-moderate second carpometacarpal joint space narrowing and peripheral osteophytosis. Moderate distal radial styloid degenerative spur contacts a degenerative spur at the distal lateral aspect of the scaphoid. Bone overlap in different regions of the scaphoid on frontal and lateral views limits evaluation for an acute fracture. This includes within the region of the distal lateral aspect of the scaphoid where a prominent degenerative spur overlies the distal radial styloid. There is decreased scaphoid bone mineralization in this region with multiple lucent likely bone nutrient foramina. No definite acute fracture is visualized. IMPRESSION: 1. 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 2. Moderate degenerative changes of the distal radial styloid and adjacent distal lateral aspect of the scaphoid. 3. No definite acute fracture is seen. If there is there is snuffbox tenderness and clinical concern for a radiographically occult fracture, consider splinting and follow-up radiographs in 10 days. Electronically Signed   By: Yvonne Kendall M.D.   On: 09/27/2021 11:00   DG FEMUR, MIN 2 VIEWS RIGHT  Result Date: 09/26/2021 CLINICAL DATA:  Status post right IM nail fixation EXAM: RIGHT FEMUR 2 VIEWS; PORTABLE PELVIS 1-2 VIEWS COMPARISON:  09/26/2021 intraoperative images and hip radiographs FINDINGS: Status  post intramedullary nail fixation of a previously noted proximal right femoral diaphyseal fracture with improved alignment of the fracture components.  No perihardware lucency or additional acute fracture in the pelvis or right femur. Air within the soft tissues, not unexpected post surgically. Superficial skin staples. IMPRESSION: Expected postoperative appearance, status post intramedullary nail fixation of a right femoral fracture. Electronically Signed   By: Merilyn Baba M.D.   On: 09/26/2021 17:38   Pelvis Portable  Result Date: 09/26/2021 CLINICAL DATA:  Status post right IM nail fixation EXAM: RIGHT FEMUR 2 VIEWS; PORTABLE PELVIS 1-2 VIEWS COMPARISON:  09/26/2021 intraoperative images and hip radiographs FINDINGS: Status post intramedullary nail fixation of a previously noted proximal right femoral diaphyseal fracture with improved alignment of the fracture components. No perihardware lucency or additional acute fracture in the pelvis or right femur. Air within the soft tissues, not unexpected post surgically. Superficial skin staples. IMPRESSION: Expected postoperative appearance, status post intramedullary nail fixation of a right femoral fracture. Electronically Signed   By: Merilyn Baba M.D.   On: 09/26/2021 17:38   DG FEMUR, MIN 2 VIEWS RIGHT  Result Date: 09/26/2021 CLINICAL DATA:  Right proximal femoral ORIF EXAM: RIGHT FEMUR 2 VIEWS COMPARISON:  Pelvis and right hip radiographs 09/26/2021 FINDINGS: Images were performed intraoperatively without the presence of a radiologist. Redemonstration of oblique proximal right femoral diaphyseal fracture. New long-stem cephalo medullary nail fixation of this fracture with improved now anatomic alignment. No complication visualized. Total fluoroscopy images: 6 Total fluoroscopy time: 183 seconds Please see intraoperative findings for further detail. IMPRESSION: Intraoperative fluoroscopy for proximal right femoral ORIF. Electronically Signed   By: Yvonne Kendall M.D.   On: 09/26/2021 17:04   DG C-Arm 1-60 Min-No Report  Result Date: 09/26/2021 Fluoroscopy was utilized by the requesting physician.   No radiographic interpretation.   DG C-Arm 1-60 Min-No Report  Result Date: 09/26/2021 Fluoroscopy was utilized by the requesting physician.  No radiographic interpretation.   DG C-Arm 1-60 Min-No Report  Result Date: 09/26/2021 Fluoroscopy was utilized by the requesting physician.  No radiographic interpretation.   DG Chest Portable 1 View  Result Date: 09/26/2021 CLINICAL DATA:  Fall, preop EXAM: PORTABLE CHEST 1 VIEW COMPARISON:  None Available. FINDINGS: Elevation of the right hemidiaphragm. Heart and mediastinal contours are within normal limits. No focal opacities or effusions. No acute bony abnormality. IMPRESSION: No active disease. Electronically Signed   By: Rolm Baptise M.D.   On: 09/26/2021 03:11   DG Hip Unilat W or Wo Pelvis 2-3 Views Right  Result Date: 09/26/2021 CLINICAL DATA:  Fall EXAM: DG HIP (WITH OR WITHOUT PELVIS) 2-3V RIGHT COMPARISON:  None Available. FINDINGS: There is a fracture through the proximal shaft of the right femur. Femoral shaft is displaced medially relative to the femoral neck. No subluxation or dislocation at the hip. IMPRESSION: Displaced right proximal femoral shaft fracture. Electronically Signed   By: Rolm Baptise M.D.   On: 09/26/2021 03:07   CT Cervical Spine Wo Contrast  Result Date: 09/26/2021 CLINICAL DATA:  Facial trauma, blunt.  Unwitnessed fall. EXAM: CT CERVICAL SPINE WITHOUT CONTRAST TECHNIQUE: Multidetector CT imaging of the cervical spine was performed without intravenous contrast. Multiplanar CT image reconstructions were also generated. RADIATION DOSE REDUCTION: This exam was performed according to the departmental dose-optimization program which includes automated exposure control, adjustment of the mA and/or kV according to patient size and/or use of iterative reconstruction technique. COMPARISON:  None Available. FINDINGS: Alignment: No subluxation Skull base and vertebrae: No acute  fracture. No primary bone lesion or focal pathologic  process. Soft tissues and spinal canal: No prevertebral fluid or swelling. No visible canal hematoma. Disc levels:  Diffuse degenerative disc disease and facet disease. Upper chest: No acute findings Other: None IMPRESSION: No acute bony abnormality. Electronically Signed   By: Rolm Baptise M.D.   On: 09/26/2021 02:46   CT Head Wo Contrast  Result Date: 09/26/2021 CLINICAL DATA:  Head trauma, minor (Age >= 65y).  Unwitnessed fall EXAM: CT HEAD WITHOUT CONTRAST TECHNIQUE: Contiguous axial images were obtained from the base of the skull through the vertex without intravenous contrast. RADIATION DOSE REDUCTION: This exam was performed according to the departmental dose-optimization program which includes automated exposure control, adjustment of the mA and/or kV according to patient size and/or use of iterative reconstruction technique. 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 No new hemorrhage, hydrocephalus or acute infarction. No mass effect or midline shift. Mild atrophy. Vascular: No hyperdense vessel or unexpected calcification. Skull: No acute calvarial abnormality. Sinuses/Orbits: No acute findings Other: None IMPRESSION: Small bilateral chronic subdural hematomas, stable or slightly smaller since prior study. No acute intracranial abnormality. Electronically Signed   By: Rolm Baptise M.D.   On: 09/26/2021 02:45   CT Cervical Spine Wo Contrast  Result Date: 09/19/2021 CLINICAL DATA:  Fall EXAM: CT CERVICAL SPINE WITHOUT CONTRAST TECHNIQUE: Multidetector CT imaging of the cervical spine was performed without intravenous contrast. Multiplanar CT image reconstructions were also generated. RADIATION DOSE REDUCTION: This exam was performed according to the departmental dose-optimization program which includes automated exposure control, adjustment of the mA and/or kV  according to patient size and/or use of iterative reconstruction technique. COMPARISON:  No prior CT of the cervical spine, correlation is made with MRI cervical spine 10/08/2020 FINDINGS: Alignment: Straightening and mild reversal of the normal cervical lordosis, with unchanged alignment compared to 10/08/2020. No traumatic listhesis. Skull base and vertebrae: No acute fracture or suspicious osseous lesion. Soft tissues and spinal canal: No prevertebral fluid or swelling. No visible canal hematoma. Disc levels: No high-grade spinal canal stenosis. Multilevel uncovertebral and facet arthropathy, which causes moderate to severe neural foraminal narrowing bilaterally at C4-C5 and on the right at C5-C6 and C6-C7. Upper chest: No focal pulmonary opacity or pleural effusion. Other: None IMPRESSION: No acute fracture or traumatic listhesis in the cervical spine. Electronically Signed   By: Merilyn Baba M.D.   On: 09/19/2021 11:54   CT Head Wo Contrast  Result Date: 09/19/2021 CLINICAL DATA:  Head trauma, moderate-severe EXAM: CT HEAD WITHOUT CONTRAST TECHNIQUE: Contiguous axial images were obtained from the base of the skull through the vertex without intravenous contrast. RADIATION DOSE REDUCTION: This exam was performed according to the departmental dose-optimization program which includes automated exposure control, adjustment of the mA and/or kV according to patient size and/or use of iterative reconstruction technique. COMPARISON:  07/12/2021 FINDINGS: Brain: Small chronic bilateral subdural hematomas are stable in size from prior measuring approximately 5 mm on the right and 3 mm on the left. No significant mass effect or midline shift. No evidence of acute infarction, hydrocephalus, or mass lesion. Patchy low-density changes within the periventricular and subcortical white matter compatible with chronic microvascular ischemic change. Moderate diffuse cerebral volume loss. Vascular: Atherosclerotic  calcifications involving the large vessels of the skull base. No unexpected hyperdense vessel. Skull: Normal. Negative for fracture or focal lesion. Sinuses/Orbits: No acute finding. Other:  None. IMPRESSION: 1. Stable small chronic bilateral subdural hematomas. 2. No new acute intracranial findings. 3. 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 Electronically Signed   By: Davina Poke D.O.   On: 09/19/2021 11:53   DG FEMUR, MIN 2 VIEWS RIGHT  Result Date: 09/19/2021 CLINICAL DATA:  Right thigh pain after a fall. EXAM: RIGHT FEMUR 2 VIEWS COMPARISON:  No comparison studies available. FINDINGS: No fracture. No worrisome lytic or sclerotic osseous abnormality. Degenerative changes noted at the knee. IMPRESSION: No acute bony abnormality. Electronically Signed   By: Misty Stanley M.D.   On: 09/19/2021 11:47   DG Knee Complete 4 Views Right  Result Date: 09/19/2021 CLINICAL DATA:  Patient tripped yesterday now with thigh pain. EXAM: RIGHT KNEE - COMPLETE 4+ VIEW COMPARISON:  None Available. FINDINGS: Four view study. No evidence for an acute fracture. No subluxation or dislocation. No joint effusion. Loss of joint space noted medial compartment with advanced hypertrophic spurring visible in all 3 compartments IMPRESSION: 1. No acute bony abnormality. No joint effusion. 2. Advanced tricompartmental osteoarthritis. Electronically Signed   By: Misty Stanley M.D.   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 Likely decompensated secondary to recent surgery, pain medication Continue supportive care, limit Tylenol use.  Switch Norco to oxycodone.   CT images and MRI images were reviewed.  No obstruction. Recommend gastroenterology evaluation and recommendation to optimize supportive care for her. Check pt, ptt.   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  She previously  tolerated well and amyloidosis: Seems to respond to the treatment well.  However liver function did not dramatically improve secondary to the reversible amyloidosis changes of the liver. I advised patient to hold off Revlimid due to the acute liver failure.  Her prognosis is poor.  I recommend optimizing supportive care for acute liver failure.  Hopefully bilirubin will improve however if bilirubin persistently worsened, I think hospice is reasonable.  Consult palliative care service to discuss goals of care. Discussed with patient and her daughter at bedside.   Per patient's request, I discussed lengthy with her son [name] over the phone. Thank you for allowing me to participate in the care of this patient.    [name], MD, PhD Hematology Oncology  10/19/2021

## 2021-10-19 NOTE — Assessment & Plan Note (Signed)
CT abdomen and pelvis showed mottled enhancement within the right lobe of the liver..Moderate ascites, cholelithiasis without complicating factors.  Nonemergent MRI when the patient can tolerate adequate breath hold technique may be helpful. Follow-up MRCP and consult GI if needed  Cystic lesions within the pancreas stable from prior exam.

## 2021-10-19 NOTE — Assessment & Plan Note (Signed)
On chemotherapy revlimid, dexamethasone and daratumumab

## 2021-10-19 NOTE — Progress Notes (Signed)
[facility]) Hospital Liaison note:  This patient is currently enrolled in [facility] outpatient-based Palliative Care. Will continue to follow for disposition.  Please call with any outpatient palliative questions or concerns.  Thank you, Lorelee Market, LPN [facility] Liaison [phone]

## 2021-10-19 NOTE — ED Notes (Signed)
EDP, Nancy Rodriguez to bedside; update provided.

## 2021-10-19 NOTE — ED Notes (Signed)
Patient transported to MRI 

## 2021-10-19 NOTE — H&P (Signed)
History and Physical    Patient: Nancy Rodriguez ZTI:[id number] DOB: July 01, 1945 DOA: 10/18/2021 DOS: the patient was seen and examined on 10/19/2021 PCP: Leonel Ramsay, MD  Patient coming from: SNF  Chief Complaint:  Chief Complaint  Patient presents with   Abnormal Lab    HPI: Nancy Rodriguez is a 76 y.o. female with medical history significant for Amyloid of the liver and kidneys followed by heme-onc and on chemotherapy plus steroids as well as history of protein calorie malnutrition, DM, CKD llla, recently hospitalized from 6/13 to 6/20 with a right femoral shaft fracture, who was sent to the ED by heme-onc due to concern for elevated bilirubin of 12 found on a routine outpatient visit.  Patient is somnolent but arousable and at baseline per daughter at bedside who gives most of the history.  Daughter states that she has been in her usual state of health at the facility with no new complaints. ED course and data review: Vitals mostly within normal limits and unremarkable.  Labs significant for total bilirubin of 11.6 with alk phos of 830.  AST 47 and ALT 44.  Ammonia 36.  Platelets elevated at 565 but CBC otherwise at baseline. CT abdomen and pelvis showed the following: IMPRESSION: Mottled enhancement within the right lobe of the liver likely related to the patient's known underlying cirrhosis. This may represent some regenerating nodules. Nonemergent MRI when the patient can tolerate adequate breath hold technique may be helpful.   Cholelithiasis without complicating factors.   Moderate ascites related to cirrhotic change.   Cystic lesions within the pancreas stable from prior exam.  The emergency room provider spoke with hematologist on-call Dr. Rogue Bussing who recommended MRCP with referral to GI if any evidence of choledocholithiasis.  Hospitalist consulted for admission.  MRCP pending at the time of admission.     Past Medical History:  Diagnosis Date   (HFpEF) heart  failure with preserved ejection fraction ([place])    a. 05/2020 Echo: EF 60-65%; b. 08/2020 cMRI ([facility]): EF 71%, no delayed hyperenhancement to suggest scar/infiltrative dzs. Nl RV fxn. Mild BAE. Triv MR, mild TR; c. 06/2021 Echo: EF 50-55%, no rwma, mild basal-septal LVH, GrII DD. Nl RV size/fxn. Mild MR. Ao sclerosis.   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 03/2012 Stress Echo([facility]): nl study.   Hypercholesterolemia    Hypertension    MGUS (monoclonal gammopathy of unknown significance)    Osteoarthritis    Rheumatoid arthritis ([place])    Past Surgical History:  Procedure Laterality Date   HERNIA REPAIR     INTRAMEDULLARY (IM) NAIL INTERTROCHANTERIC Right 09/26/2021   Procedure: INTRAMEDULLARY (IM) NAIL INTERTROCHANTRIC;  Surgeon: Thornton Park, MD;  Location: [facility] ORS;  Service: Orthopedics;  Laterality: Right;   PARATHYROIDECTOMY     Social History:  reports that she has never smoked. She has never used smokeless tobacco. She reports that she does not drink alcohol and does not use drugs.  Allergies  Allergen Reactions   Benazepril Anaphylaxis and Swelling    TONGUE AND LIPS     Lisinopril Swelling   Tolmetin Swelling   Nsaids Swelling    Family History  Problem Relation Age of Onset   Diabetes Daughter    Diabetes Son  Dementia Mother    Cancer Father    Esophageal cancer Sister    Brain cancer Brother     Prior to Admission medications   Medication Sig Start Date End Date Taking? Authorizing Provider  acyclovir (ZOVIRAX) 400 MG tablet Take 1 tablet (400 mg total) by mouth 2 (two) times daily. 06/23/21   [name], MD  albuterol (VENTOLIN HFA) 108 (90 Base) MCG/ACT inhaler Inhale 2 puffs into the lungs every 4 (four) hours as needed for wheezing or shortness of breath.  06/21/15   [provider]  amLODipine (NORVASC) 5 MG tablet Take 5 mg by mouth daily. 11/26/19   [provider]  Continuous Blood Gluc Sensor (FREESTYLE LIBRE 14 DAY SENSOR) MISC SMARTSIG:1 Kit(s) Topical Every 2 Weeks 10/28/20   [provider]  [employer] VITAMIN C 500 MG tablet TAKE 1 TABLET BY MOUTH DAILY 04/27/21   [name], MD  dexamethasone (DECADRON) 4 MG tablet TAKE 5 TABLETS BY MOUTH ONCE A WEEK. 04/29/21   [name], MD  enoxaparin (LOVENOX) 40 MG/0.4ML injection Inject 0.4 mLs (40 mg total) into the skin daily for 14 days. 10/03/21 10/17/21  Annita Brod, MD  EPINEPHrine 0.3 mg/0.3 mL IJ SOAJ injection Inject 0.3 mg into the muscle as needed. 05/06/09   [provider]  ferrous sulfate 325 (65 FE) MG EC tablet Take 325 mg by mouth daily.    [provider]  gabapentin (NEURONTIN) 100 MG capsule Take 100 mg by mouth at bedtime.    [provider]  HYDROcodone-acetaminophen (NORCO/VICODIN) 5-325 MG tablet Take 1-2 tablets by mouth every 4 (four) hours as needed for moderate pain (pain score 4-6). 10/02/21   Annita Brod, MD  insulin glargine (LANTUS SOLOSTAR) 100 UNIT/ML Solostar Pen Inject 15 Units into the skin at bedtime.    [provider]  insulin lispro (HUMALOG) 100 UNIT/ML injection Inject into the skin 3 (three) times daily before meals. Sliding scale    [provider]  KLOR-CON M20 20 MEQ tablet TAKE 1 TABLET BY MOUTH TWICE A DAY 08/01/21   [name], MD  lenalidomide (REVLIMID) 10 MG capsule Take 1 capsule (10 mg total) by mouth daily. Take for 21 days, then hold for 7 days. Repeat every 28 days. 10/11/21   [name], MD  loperamide (IMODIUM) 2 MG capsule Take 1 capsule (2 mg total) by mouth See admin instructions. Initial: 4 mg, followed by 2 mg after each loose stool, maximum 16 mg within 24 hours. 04/27/21   [name], MD  metoprolol succinate (TOPROL-XL) 50 MG 24 hr tablet Take 50 mg by mouth daily.    [provider]  omeprazole (PRILOSEC) 40 MG capsule Take 40 mg by mouth 2 (two) times daily.    [provider]  polyethylene glycol (MIRALAX / GLYCOLAX) 17 g packet Take 17 g by mouth daily as needed for mild constipation. 10/02/21   Annita Brod, MD  sertraline (ZOLOFT) 25 MG tablet Take 50 mg by mouth daily. 11/22/20   [provider]  torsemide (DEMADEX) 20 MG tablet Take 20 mg by mouth daily as needed.    [provider]  traMADol (ULTRAM) 50 MG tablet Take 1 tablet (50 mg total) by mouth every 12 (twelve) hours as needed for moderate pain. 10/02/21   Annita Brod, MD    Physical Exam: Vitals:   10/18/21 1823 10/18/21 2120 10/18/21 2230 10/19/21 0000  BP: (!) 147/73 (!) 162/69 (!) 162/69 (!) 170/77  Pulse: 74 84 68 68  Resp: $Remo'16 16 18   'qjkwO$ Temp: 97.7 F (36.5 C) 97.7 F (36.5 C)    TempSrc: Oral Oral    SpO2: 100% 100% 100% 100%   Physical Exam Vitals and nursing note reviewed.  Constitutional:      General: She is sleeping. She is not in acute distress.    Appearance: She is underweight.  HENT:     Head: Normocephalic and atraumatic.  Cardiovascular:     Rate and Rhythm: Normal rate and regular rhythm.     Heart sounds: Normal heart sounds.  Pulmonary:     Effort: Pulmonary effort is normal.     Breath sounds: Normal breath sounds.  Abdominal:     Palpations: Abdomen is soft.     Tenderness: There is no abdominal tenderness.  Neurological:     Mental Status: She is easily aroused. Mental status is at baseline.     Labs on Admission: I have personally reviewed following labs and imaging studies  CBC: Recent Labs  Lab 10/18/21 1606  WBC 7.2  HGB 10.2*  HCT 29.3*  MCV 81.4  PLT 517*   Basic Metabolic Panel: Recent Labs  Lab 10/18/21 1606  NA 139  K 4.0  CL 110  CO2 19*  GLUCOSE 132*  BUN 30*  CREATININE 0.98  CALCIUM 8.8*   GFR: CrCl cannot be calculated (Unknown ideal weight.). Liver Function Tests: Recent Labs   Lab 10/18/21 1606  AST 47*  ALT 44  ALKPHOS 830*  BILITOT 11.6*  PROT 5.6*  ALBUMIN 2.4*   Recent Labs  Lab 10/18/21 1606  LIPASE 24   Recent Labs  Lab 10/18/21 2301  AMMONIA 36*   Coagulation Profile: No results for input(s): "INR", "PROTIME" in the last 168 hours. Cardiac Enzymes: No results for input(s): "CKTOTAL", "CKMB", "CKMBINDEX", "TROPONINI" in the last 168 hours. BNP (last 3 results) No results for input(s): "PROBNP" in the last 8760 hours. HbA1C: No results for input(s): "HGBA1C" in the last 72 hours. CBG: No results for input(s): "GLUCAP" in the last 168 hours. Lipid Profile: No results for input(s): "CHOL", "HDL", "LDLCALC", "TRIG", "CHOLHDL", "LDLDIRECT" in the last 72 hours. Thyroid Function Tests: No results for input(s): "TSH", "T4TOTAL", "FREET4", "T3FREE", "THYROIDAB" in the last 72 hours. Anemia Panel: No results for input(s): "VITAMINB12", "FOLATE", "FERRITIN", "TIBC", "IRON", "RETICCTPCT" in the last 72 hours. Urine analysis:    Component Value Date/Time   COLORURINE AMBER (A) 09/19/2021 1318   APPEARANCEUR CLEAR (A) 09/19/2021 1318   LABSPEC 1.022 09/19/2021 1318   PHURINE 5.0 09/19/2021 1318   GLUCOSEU 50 (A) 09/19/2021 1318   HGBUR NEGATIVE 09/19/2021 1318   BILIRUBINUR NEGATIVE 09/19/2021 1318   KETONESUR NEGATIVE 09/19/2021 1318   PROTEINUR >=300 (A) 09/19/2021 1318   NITRITE NEGATIVE 09/19/2021 1318   LEUKOCYTESUR NEGATIVE 09/19/2021 1318    Radiological Exams on Admission: CT Abdomen Pelvis W Contrast  Result Date: 10/18/2021 CLINICAL DATA:  Elevated ammonia level and bilirubin level with painless jaundice, initial encounter EXAM: CT ABDOMEN AND PELVIS WITH CONTRAST TECHNIQUE: Multidetector CT imaging of the abdomen and pelvis was performed using the standard protocol following bolus administration of intravenous contrast. RADIATION DOSE REDUCTION: This exam was performed according to the departmental dose-optimization program which  includes automated exposure control, adjustment of the mA and/or kV according to patient size and/or use of iterative reconstruction technique. CONTRAST:  24mL OMNIPAQUE IOHEXOL 300 MG/ML  SOLN COMPARISON:  MRI from 04/06/2021, ultrasound from 06/30/2021 FINDINGS: Lower chest:  Lung bases are free of acute infiltrate or sizable effusion. Hepatobiliary: Gallbladder is well distended with multiple dependent gallstones. Mottled decreased attenuation is identified throughout the right lobe of the liver without discrete mass. These changes are felt to be related to progressive cirrhotic change. Cyst is again noted in the left lobe of the liver stable in appearance from the prior exam. Pancreas: Pancreas again demonstrates cystic lesions within the head and body of the pancreas stable in appearance from the prior exam. Spleen: Normal in size without focal abnormality. Adrenals/Urinary Tract: Adrenal glands are within normal limits. Kidneys demonstrate a normal enhancement pattern bilaterally. No renal calculi or obstructive changes are noted. Bladder is well distended. Stomach/Bowel: The appendix is within normal limits. No obstructive or inflammatory changes colon are seen. Small bowel and stomach are within limits. Vascular/Lymphatic: Aortic atherosclerosis. No enlarged abdominal or pelvic lymph nodes. Reproductive: Uterus and bilateral adnexa are unremarkable. Other: Moderate ascites is noted consistent with the underlying cirrhotic change. Musculoskeletal: Degenerative changes of the lumbar spine are noted. Right hip fixation is noted. Progressive L4 compression deformity is noted but appears chronic. IMPRESSION: Mottled enhancement within the right lobe of the liver likely related to the patient's known underlying cirrhosis. This may represent some regenerating nodules. Nonemergent MRI when the patient can tolerate adequate breath hold technique may be helpful. Cholelithiasis without complicating factors. Moderate  ascites related to cirrhotic change. Cystic lesions within the pancreas stable from prior exam. Electronically Signed   By: Inez Catalina M.D.   On: 10/18/2021 22:02     Data Reviewed: Relevant notes from primary care and specialist visits, past discharge summaries as available in EHR, including Care Everywhere. Prior diagnostic testing as pertinent to current admission diagnoses Updated medications and problem lists for reconciliation ED course, including vitals, labs, imaging, treatment and response to treatment Triage notes, nursing and pharmacy notes and ED provider's notes Notable results as noted in HPI   Assessment and Plan: * Elevated bilirubin CT abdomen and pelvis showed mottled enhancement within the right lobe of the liver..Moderate ascites, cholelithiasis without complicating factors.  Nonemergent MRI when the patient can tolerate adequate breath hold technique may be helpful. Follow-up MRCP and consult GI if needed  Cystic lesions within the pancreas stable from prior exam.   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  Continue home amlodipine and metoprolol        DVT prophylaxis: SCD in case of procedure  Consults: none  Advance Care Planning:   Code Status: Prior   Family Communication: Daughter at bedside  Disposition Plan: Back to previous home environment  Severity of Illness: The appropriate patient status for this patient is INPATIENT. Inpatient status is judged to be reasonable and necessary in order to provide the required intensity of service to ensure the patient's safety. The patient's presenting symptoms, physical  exam findings, and initial radiographic and laboratory data in the context of their chronic comorbidities is felt to place them at high risk for further clinical deterioration. Furthermore, it is not anticipated that the patient will be medically stable for discharge from the hospital within 2 midnights of admission.   * I certify that at the point of admission it is my clinical judgment that the  patient will require inpatient hospital care spanning beyond 2 midnights from the point of admission due to high intensity of service, high risk for further deterioration and high frequency of surveillance required.*  Author: Athena Masse, MD 10/19/2021 1:02 AM  For on call review [email or web address].

## 2021-10-19 NOTE — Progress Notes (Signed)
[name] is a 76 year old woman with medical history significant for light chain amyloidosis, amyloid liver disease who was referred to the emergency room for evaluation of increasing bilirubin level above her baseline.  Interval events noted.  No abdominal pain, no change in mental status.  Her daughter said she had woken up in the middle of the night about 1 or 2 nights prior to admission.  She was confused and visually hallucinating at that time.  She said she is not sure whether she was having a dream or not.  Since then, there has not been any change in mental status.  Family noticed yellowish discoloration of her eyes.  No abdominal pain, vomiting or shortness of breath.  Vital signs are stable.  She has scleral icterus and midline abdominal hernia.  She has gallstones on abdominal imaging but no evidence of CBD stone or biliary tract dilation.  Consulted Dr. Haig Prophet, gastroenterologist, to assist with management.  Palliative care team has been consulted as well.

## 2021-10-19 NOTE — Assessment & Plan Note (Signed)
Renal function at baseline 

## 2021-10-19 NOTE — ED Notes (Signed)
Hospitalist at bedside 

## 2021-10-19 NOTE — Assessment & Plan Note (Signed)
Nutritional supplements

## 2021-10-19 NOTE — Assessment & Plan Note (Signed)
Slightly elevated.  Continue home amlodipine and metoprolol

## 2021-10-19 NOTE — Consult Note (Signed)
Consultation  Referring Provider:     Dr Damita Dunnings Admit date 10/18/21 Consult date        10/19/21 Reason for Consultation:     hyperbilirubinemia         HPI:   Nancy Rodriguez is a 76 y.o. 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 Direct was 6.3, indirect 4.2 this am. 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 Palliative care has been discussed severaly times due to her advanced disease, limited treatment options. She had MRI w/wo contrast of abdomen today IMPRESSION: 1. Morphologic features of the liver compatible with cirrhosis. 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 2. Gallstones. Mild gallbladder wall thickening is nonspecific in the setting of cirrhosis and portal venous hypertension. 3. No signs of biliary ductal dilatation.  No choledocholithiasis. 4. Multiple cystic lesions are identified within the uncinate process, head and tail of pancreas. The largest measures 2.4 cm. Follow-up imaging with repeat MRI in 6 months is recommended. This recommendation follows ACR consensus guidelines: Management of Incidental Pancreatic Cysts: A White Paper of the ACR Incidental Findings Committee. J Am Coll Radiol 1962;22:979-892. 5.  Aortic Atherosclerosis (ICD10-I70.0). This did not demonstrate any biliary obstruction. It is noted that her revlimid can increase the bilirubin and that she has been getting extra acetaminophen with the hydrocodone for pain meds since her recent hip fracture. These have been on hold and her biliriubin has decreased as  has her ALP.PT/INR pending. She has no thrombocytopenia. lipase normal. She has no current GI related concerns. Dr Haig Prophet and I were present with the patient and he spoke with her son [name] on the phone about the likelihood the bilirubin increase was due to medications. Her daughter was not present. Palliative care was also discussed with patient and her son  PREVIOUS ENDOSCOPIES:              CT A/P yesterday: IMPRESSION: Mottled enhancement within the right lobe of the liver likely related to the patient's known underlying cirrhosis. This may represent some regenerating nodules. Nonemergent MRI when the patient can tolerate adequate breath hold technique may be helpful.  Cholelithiasis without complicating factors.  Moderate ascites related to cirrhotic change.  Cystic lesions within the pancreas stable from prior exam.  Guided liver biopsy on 05/11/2020, which showed diffuse amyloidosis that was confirmed by [facility] MS subtyping to be AL lambda amyloidosis   Past Medical History:  Diagnosis Date   (HFpEF) heart failure with preserved ejection fraction ([place])    a. 05/2020 Echo: EF 60-65%; b. 08/2020 cMRI ([facility]): EF 71%, no delayed hyperenhancement to suggest scar/infiltrative dzs. Nl RV fxn. Mild BAE. Triv MR, mild TR; c. 06/2021 Echo: EF 50-55%, no rwma, mild basal-septal LVH, GrII DD. Nl RV size/fxn. Mild MR. Ao sclerosis.   AL amyloidosis ([place])    Anemia due to chronic kidney disease    Anemia in chronic kidney disease   CKD (chronic kidney disease), stage III ([place])    Diabetes mellitus without complication ([place])    History of cardiovascular stress test    a. 03/2012  Stress Echo([facility]): nl study.   Hypercholesterolemia    Hypertension    MGUS (monoclonal gammopathy of unknown significance)    Osteoarthritis    Rheumatoid arthritis ([place])     Past Surgical History:  Procedure Laterality Date   HERNIA REPAIR     INTRAMEDULLARY (IM) NAIL INTERTROCHANTERIC Right 09/26/2021    Procedure: INTRAMEDULLARY (IM) NAIL INTERTROCHANTRIC;  Surgeon: Thornton Park, MD;  Location: [facility] ORS;  Service: Orthopedics;  Laterality: Right;   PARATHYROIDECTOMY      Family History  Problem Relation Age of Onset   Diabetes Daughter    Diabetes Son    Dementia Mother    Cancer Father    Esophageal cancer Sister    Brain cancer Brother      Social History   Tobacco Use   Smoking status: Never   Smokeless tobacco: Never  Vaping Use   Vaping Use: Never used  Substance Use Topics   Alcohol use: No   Drug use: Never    Prior to Admission medications   Medication Sig Start Date End Date Taking? Authorizing Provider  acyclovir (ZOVIRAX) 400 MG tablet Take 1 tablet (400 mg total) by mouth 2 (two) times daily. 06/23/21  Yes [name], MD  amLODipine (NORVASC) 5 MG tablet Take 5 mg by mouth daily. 11/26/19  Yes [provider]  dexamethasone (DECADRON) 4 MG tablet TAKE 5 TABLETS BY MOUTH ONCE A WEEK. 04/29/21  Yes [name], MD  KLOR-CON M20 20 MEQ tablet TAKE 1 TABLET BY MOUTH TWICE A DAY 08/01/21  Yes [name], MD  lactulose (CHRONULAC) 10 GM/15ML solution Take 30 g by mouth 2 (two) times daily.   Yes [provider]  lenalidomide (REVLIMID) 10 MG capsule Take 1 capsule (10 mg total) by mouth daily. Take for 21 days, then hold for 7 days. Repeat every 28 days. 10/11/21  Yes [name], MD  loperamide (IMODIUM) 2 MG capsule Take 1 capsule (2 mg total) by mouth See admin instructions. Initial: 4 mg, followed by 2 mg after each loose stool, maximum 16 mg within 24 hours. 04/27/21  Yes [name], MD  metoprolol succinate (TOPROL-XL) 50 MG 24 hr tablet Take 50 mg by mouth daily.   Yes [provider]  omeprazole (PRILOSEC) 40 MG capsule Take 40 mg by mouth 2 (two) times daily.   Yes [provider]  polyethylene glycol (MIRALAX / GLYCOLAX) 17 g packet Take 17 g by mouth daily as needed for mild constipation. 10/02/21  Yes Annita Brod, MD  sertraline  (ZOLOFT) 25 MG tablet Take 50 mg by mouth daily. 11/22/20  Yes [provider]  traMADol (ULTRAM) 50 MG tablet Take 1 tablet (50 mg total) by mouth every 12 (twelve) hours as needed for moderate pain. 10/02/21  Yes Annita Brod, MD  albuterol (VENTOLIN HFA) 108 (90 Base) MCG/ACT inhaler Inhale 2 puffs into the lungs every 4 (four) hours as needed for wheezing or shortness of breath. 06/21/15   [provider]  Continuous Blood Gluc Sensor (FREESTYLE LIBRE 14 DAY SENSOR) MISC SMARTSIG:1 Kit(s) Topical Every 2 Weeks Patient not taking: Reported on 10/19/2021 10/28/20   [provider]  [employer] VITAMIN C 500 MG tablet TAKE 1 TABLET BY MOUTH DAILY Patient not taking: Reported on 10/19/2021 04/27/21   [name], MD  enoxaparin (LOVENOX) 40 MG/0.4ML injection Inject 0.4 mLs (40 mg total) into the skin daily for 14 days. 10/03/21 10/17/21  Annita Brod, MD  EPINEPHrine 0.3 mg/0.3 mL  IJ SOAJ injection Inject 0.3 mg into the muscle as needed. 05/06/09   [provider]  ferrous sulfate 325 (65 FE) MG EC tablet Take 325 mg by mouth daily. Patient not taking: Reported on 10/19/2021    [provider]  gabapentin (NEURONTIN) 100 MG capsule Take 100 mg by mouth at bedtime.    [provider]  HYDROcodone-acetaminophen (NORCO/VICODIN) 5-325 MG tablet Take 1-2 tablets by mouth every 4 (four) hours as needed for moderate pain (pain score 4-6). Patient not taking: Reported on 10/19/2021 10/02/21   Annita Brod, MD  insulin glargine (LANTUS SOLOSTAR) 100 UNIT/ML Solostar Pen Inject 15 Units into the skin at bedtime.    [provider]  insulin lispro (HUMALOG) 100 UNIT/ML injection Inject into the skin 3 (three) times daily before meals. Sliding scale    [provider]  torsemide (DEMADEX) 20 MG tablet Take 20 mg by mouth daily as needed.    [provider]    Current Facility-Administered Medications  Medication Dose Route Frequency  Provider Last Rate Last Admin   amLODipine (NORVASC) tablet 5 mg  5 mg Oral Daily Athena Masse, MD       hydrALAZINE (APRESOLINE) injection 5 mg  5 mg Intravenous Q4H PRN Athena Masse, MD       insulin aspart (novoLOG) injection 0-9 Units  0-9 Units Subcutaneous Q4H Athena Masse, MD       metoprolol succinate (TOPROL-XL) 24 hr tablet 50 mg  50 mg Oral Daily Judd Gaudier V, MD       morphine (PF) 2 MG/ML injection 2 mg  2 mg Intravenous Q2H PRN Athena Masse, MD       ondansetron [facility]) tablet 4 mg  4 mg Oral Q6H PRN Athena Masse, MD       Or   ondansetron [facility]) injection 4 mg  4 mg Intravenous Q6H PRN Athena Masse, MD       oxyCODONE (Oxy IR/ROXICODONE) immediate release tablet 5 mg  5 mg Oral Q6H PRN [name], MD       sertraline (ZOLOFT) tablet 50 mg  50 mg Oral Daily Athena Masse, MD        Allergies as of 10/18/2021 - Review Complete 10/18/2021  Allergen Reaction Noted   Benazepril Anaphylaxis and Swelling 06/18/2012   Lisinopril Swelling 03/19/2014   Tolmetin Swelling 06/21/2015   Nsaids Swelling 12/01/2014     Review of Systems:    All systems reviewed and negative except where noted in HPI with excpetion of fatigue.      Physical Exam:  Vital signs in last 24 hours: Temp:  [97.7 F (36.5 C)-98.7 F (37.1 C)] 98.7 F (37.1 C) (07/06 1216) Pulse Rate:  [66-84] 73 (07/06 1216) Resp:  [15-18] 18 (07/06 1216) BP: (124-170)/(55-77) 124/55 (07/06 1216) SpO2:  [100 %] 100 % (07/06 1216)   General:   Pleasant elderly woman in NAD Head:  Normocephalic and atraumatic. Eyes:   Sclearl icterus.   Conjunctiva pink. Ears:  Normal auditory acuity. Mouth: Mucosa pink moist, no lesions. Neck:  Supple; no masses felt Lungs:  Respirations even and unlabored. Lungs clear to auscultation bilaterally.   No wheezes, crackles, or rhonchi.  Heart:  S1S2, RRR, no MRG. No edema. Abdomen:   Flat, soft, nondistended, nontender. Normal bowel sounds. No appreciable  masses or hepatomegaly. No rebound signs or other peritoneal signs. Rectal:  Not performed.  Msk:  MAEW x4, No clubbing or cyanosis.  Strength 5/5. Symmetrical without gross deformities. Neurologic:  Alert and  oriented x4;  Cranial nerves II-XII intact.  Skin:  Warm, dry, jaundiced without significant lesions or rashes. Psych:  Alert and cooperative. Normal affect.  LAB RESULTS: Recent Labs    10/18/21 1606  WBC 7.2  HGB 10.2*  HCT 29.3*  PLT 565*   BMET Recent Labs    10/18/21 1606  NA 139  K 4.0  CL 110  CO2 19*  GLUCOSE 132*  BUN 30*  CREATININE 0.98  CALCIUM 8.8*   LFT Recent Labs    10/19/21 0836  PROT 5.2*  ALBUMIN 2.3*  AST 33  ALT 37  ALKPHOS 721*  BILITOT 10.5*  BILIDIR 6.2*  IBILI 4.3*   PT/INR No results for input(s): "LABPROT", "INR" in the last 72 hours.  STUDIES: MR ABDOMEN MRCP W WO CONTAST  Result Date: 10/19/2021 CLINICAL DATA:  Elevated bilirubin levels.  History of cirrhosis EXAM: MRI ABDOMEN WITHOUT AND WITH CONTRAST (INCLUDING MRCP) TECHNIQUE: Multiplanar multisequence MR imaging of the abdomen was performed both before and after the administration of intravenous contrast. Heavily T2-weighted images of the biliary and pancreatic ducts were obtained, and three-dimensional MRCP images were rendered by post processing. CONTRAST:  3mL GADAVIST GADOBUTROL 1 MMOL/ML IV SOLN COMPARISON:  CT AP 10/18/2021 FINDINGS: Lower chest: No acute findings. Hepatobiliary: The liver has a cirrhotic appearance with contour nodularity and hypertrophy of the caudate lobe and lateral segment of left hepatic lobe. Left lobe of liver cyst containing thin internal area of eccentric septation is noted measuring 1.3 cm. Heterogeneous enhancement is identified anterior right hepatic lobe. No definite focal enhancing liver lesions identified. There are multiple stones identified within the dependent portion of the gallbladder measuring up to 3 mm. Mild gallbladder wall thickening  measures up to 3 mm which is nonspecific in the setting of cirrhosis and portal venous hypertension. There is no intrahepatic bile duct dilatation. The CBD is upper limits of normal in caliber measuring 6 mm. No choledocholithiasis identified Pancreas: No main duct dilatation or inflammation identified. Multiple cystic lesions are identified within the uncinate process, head and tail of pancreas. -Bilobed cyst containing a internal area of hairline septation measures 2.4 x 1.5 cm within the uncinate process, image 28/4. -Within the tail of pancreas there is a bilobed cystic structure containing a thin internal area of septation measuring 1.8 cm, image 25/4. -Unilocular cyst within the anterior head of pancreas measures 7 mm, image 27/4. Spleen:  Within normal limits in size and appearance. Adrenals/Urinary Tract: Normal adrenal glands. No suspicious mass or hydronephrosis identified. Several small simple appearing cysts are noted. No follow-up imaging recommended. Stomach/Bowel: Visualized portions within the abdomen are unremarkable. Vascular/Lymphatic: Aortic atherosclerosis without aneurysm. No adenopathy. Other: There is a moderate volume of ascites identified within the upper abdomen. Musculoskeletal: No suspicious bone lesions identified. IMPRESSION: 1. Morphologic features of the liver compatible with cirrhosis. 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 2. Gallstones. Mild gallbladder wall thickening is nonspecific in the setting of cirrhosis and portal venous hypertension. 3. No signs of biliary ductal dilatation.  No choledocholithiasis. 4. Multiple cystic lesions are identified within the uncinate process, head and tail of pancreas. The largest measures 2.4 cm. Follow-up imaging with repeat MRI in 6 months is recommended. This recommendation follows ACR consensus guidelines: Management of Incidental Pancreatic Cysts: A White Paper of the ACR Incidental Findings Committee. J Am Coll  Radiol 2017;14:911-923. 5.  Aortic Atherosclerosis (ICD10-I70.0). Electronically Signed  By: Kerby Moors M.D.   On: 10/19/2021 05:18   CT Abdomen Pelvis W Contrast  Result Date: 10/18/2021 CLINICAL DATA:  Elevated ammonia level and bilirubin level with painless jaundice, initial encounter EXAM: CT ABDOMEN AND PELVIS WITH CONTRAST TECHNIQUE: Multidetector CT imaging of the abdomen and pelvis was performed using the standard protocol following bolus administration of intravenous contrast. RADIATION DOSE REDUCTION: This exam was performed according to the departmental dose-optimization program which includes automated exposure control, adjustment of the mA and/or kV according to patient size and/or use of iterative reconstruction technique. CONTRAST:  70mL OMNIPAQUE IOHEXOL 300 MG/ML  SOLN COMPARISON:  MRI from 04/06/2021, ultrasound from 06/30/2021 FINDINGS: Lower chest: Lung bases are free of acute infiltrate or sizable effusion. Hepatobiliary: Gallbladder is well distended with multiple dependent gallstones. Mottled decreased attenuation is identified throughout the right lobe of the liver without discrete mass. These changes are felt to be related to progressive cirrhotic change. Cyst is again noted in the left lobe of the liver stable in appearance from the prior exam. Pancreas: Pancreas again demonstrates cystic lesions within the head and body of the pancreas stable in appearance from the prior exam. Spleen: Normal in size without focal abnormality. Adrenals/Urinary Tract: Adrenal glands are within normal limits. Kidneys demonstrate a normal enhancement pattern bilaterally. No renal calculi or obstructive changes are noted. Bladder is well distended. Stomach/Bowel: The appendix is within normal limits. No obstructive or inflammatory changes colon are seen. Small bowel and stomach are within limits. Vascular/Lymphatic: Aortic atherosclerosis. No enlarged abdominal or pelvic lymph nodes. Reproductive:  Uterus and bilateral adnexa are unremarkable. Other: Moderate ascites is noted consistent with the underlying cirrhotic change. Musculoskeletal: Degenerative changes of the lumbar spine are noted. Right hip fixation is noted. Progressive L4 compression deformity is noted but appears chronic. IMPRESSION: Mottled enhancement within the right lobe of the liver likely related to the patient's known underlying cirrhosis. This may represent some regenerating nodules. Nonemergent MRI when the patient can tolerate adequate breath hold technique may be helpful. Cholelithiasis without complicating factors. Moderate ascites related to cirrhotic change. Cystic lesions within the pancreas stable from prior exam. Electronically Signed   By: Inez Catalina M.D.   On: 10/18/2021 22:02       Impression / Plan:   Elevated bilirubin- no obstructive lesion on imaging- likely medcation related to either/both the revimilid and acetaminophen- recommend holding for some time if ok with oncology with consideration of restarting later. Would follow liver enzymes/pt-inr, and agree with palliative care to discuss goals/mgmt. She is clinically stable.  Thank you very much for this consult. These services were provided by Stephens November, NP-C, in collaboration with Lesly Rubenstein, MD, with whom I have discussed this patient in full.   Stephens November, NP-C

## 2021-10-20 DIAGNOSIS — E854 Organ-limited amyloidosis: Secondary | ICD-10-CM | POA: Diagnosis not present

## 2021-10-20 DIAGNOSIS — N1831 Chronic kidney disease, stage 3a: Secondary | ICD-10-CM | POA: Diagnosis not present

## 2021-10-20 DIAGNOSIS — Z515 Encounter for palliative care: Secondary | ICD-10-CM

## 2021-10-20 DIAGNOSIS — D631 Anemia in chronic kidney disease: Secondary | ICD-10-CM

## 2021-10-20 DIAGNOSIS — K77 Liver disorders in diseases classified elsewhere: Secondary | ICD-10-CM | POA: Diagnosis not present

## 2021-10-20 DIAGNOSIS — S72321A Displaced transverse fracture of shaft of right femur, initial encounter for closed fracture: Secondary | ICD-10-CM | POA: Diagnosis not present

## 2021-10-20 DIAGNOSIS — R17 Unspecified jaundice: Secondary | ICD-10-CM | POA: Diagnosis not present

## 2021-10-20 DIAGNOSIS — E440 Moderate protein-calorie malnutrition: Secondary | ICD-10-CM

## 2021-10-20 LAB — COMPREHENSIVE METABOLIC PANEL
ALT: 38 U/L (ref 0–44)
AST: 44 U/L — ABNORMAL HIGH (ref 15–41)
Albumin: 2.1 g/dL — ABNORMAL LOW (ref 3.5–5.0)
Alkaline Phosphatase: 708 U/L — ABNORMAL HIGH (ref 38–126)
Anion gap: 5 (ref 5–15)
BUN: 23 mg/dL (ref 8–23)
CO2: 21 mmol/L — ABNORMAL LOW (ref 22–32)
Calcium: 8.5 mg/dL — ABNORMAL LOW (ref 8.9–10.3)
Chloride: 113 mmol/L — ABNORMAL HIGH (ref 98–111)
Creatinine, Ser: 0.89 mg/dL (ref 0.44–1.00)
GFR, Estimated: >60 mL/min (ref >60)
Glucose, Bld: 115 mg/dL — ABNORMAL HIGH (ref 70–99)
Potassium: 3.5 mmol/L (ref 3.5–5.1)
Sodium: 139 mmol/L (ref 135–145)
Total Bilirubin: 10.7 mg/dL — ABNORMAL HIGH (ref 0.3–1.2)
Total Protein: 4.7 g/dL — ABNORMAL LOW (ref 6.5–8.1)

## 2021-10-20 LAB — CBC
HCT: 25 % — ABNORMAL LOW (ref 36.0–46.0)
Hemoglobin: 8.8 g/dL — ABNORMAL LOW (ref 12.0–15.0)
MCH: 27.4 pg (ref 26.0–34.0)
MCHC: 35.2 g/dL (ref 30.0–36.0)
MCV: 77.9 fL — ABNORMAL LOW (ref 80.0–100.0)
Platelets: 487 10*3/uL — ABNORMAL HIGH (ref 150–400)
RBC: 3.21 MIL/uL — ABNORMAL LOW (ref 3.87–5.11)
RDW: 24.8 % — ABNORMAL HIGH (ref 11.5–15.5)
WBC: 6.6 10*3/uL (ref 4.0–10.5)
nRBC: 0 % (ref 0.0–0.2)

## 2021-10-20 LAB — GLUCOSE, CAPILLARY
Glucose-Capillary: 124 mg/dL — ABNORMAL HIGH (ref 70–99)
Glucose-Capillary: 147 mg/dL — ABNORMAL HIGH (ref 70–99)
Glucose-Capillary: 130 mg/dL — ABNORMAL HIGH (ref 70–99)
Glucose-Capillary: 190 mg/dL — ABNORMAL HIGH (ref 70–99)

## 2021-10-20 MED ORDER — FERROUS SULFATE 325 (65 FE) MG PO TABS
325.0000 mg | ORAL_TABLET | Freq: Two times a day (BID) | ORAL | Status: DC
  Administered 2021-10-20 – 2021-10-21 (×3): 325 mg via ORAL
  Filled 2021-10-20 (×4): qty 1

## 2021-10-20 MED ORDER — HEPARIN SODIUM (PORCINE) 5000 UNIT/ML IJ SOLN
5000.0000 [IU] | Freq: Three times a day (TID) | INTRAMUSCULAR | Status: DC
  Administered 2021-10-20 – 2021-10-22 (×5): 5000 [IU] via SUBCUTANEOUS
  Filled 2021-10-20 (×5): qty 1

## 2021-10-20 NOTE — Progress Notes (Addendum)
Progress Note    Nancy Rodriguez  ZCH:[id number] DOB: 10-04-1945  DOA: 10/18/2021 PCP: Leonel Ramsay, MD      Brief Narrative:    Medical records reviewed and are as summarized below:  Nancy Rodriguez is a 76 y.o. female with medical history significant for light chain amyloidosis, amyloid liver disease who was referred to the emergency room for evaluation of increasing bilirubin level above her baseline.       Assessment/Plan:   Principal Problem:   Elevated bilirubin Active Problems:   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HCC)   Pancreatic cyst   Chronic kidney disease, stage 3a (HCC)   Malnutrition of moderate degree   Essential hypertension   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HCC)   Palliative care encounter    Hyperbilirubinemia, elevated liver enzymes No CBD stone or biliary tract dilatation noted on MRCP.  Hyperbilirubinemia suspected to be from Revlimid late and oral Tylenol.  It could also be from progressive liver disease.  Bilirubin is still significantly elevated.  Follow-up with oncologist and gastroenterologist   Amyloid liver [facility]) She was on chemotherapy revlimid, dexamethasone and daratumumab in the outpatient setting   Pancreatic cyst Stable on CT   Chronic kidney disease, stage 3a ([place]) Renal function at baseline   Malnutrition of moderate degree Nutritional supplements   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HCC) Hemoglobin 10.2, slightly improved from baseline   Essential hypertension Continue home amlodipine and metoprolol   Generalized weakness PT recommends discharge to SNF.  Follow-up with social worker to assist with disposition.         Diet Order             Diet heart healthy/carb modified Room service appropriate? Yes; Fluid consistency: Thin  Diet effective now                          Consultants: Copywriter, advertising, oncologist  Procedures: None    Medications:    amLODipine  5 mg Oral  Daily   heparin injection (subcutaneous)  5,000 Units Subcutaneous Q8H   insulin aspart  0-9 Units Subcutaneous TID AC & HS   metoprolol succinate  50 mg Oral Daily   sertraline  50 mg Oral Daily   Continuous Infusions:   Anti-infectives (From admission, onward)    None              Family Communication/Anticipated D/C date and plan/Code Status   DVT prophylaxis: heparin injection 5,000 Units Start: 10/20/21 2200 SCDs Start: 10/19/21 0111     Code Status: Full Code  Family Communication: None Disposition Plan: Plan to discharge to SNF   Status is: Observation The patient will require care spanning > 2 midnights and should be moved to inpatient because: Awaiting placement to SNF       Subjective:   Interval events noted.  No abdominal pain, confusion, vomiting, shortness of breath or chest pain.  Objective:    Vitals:   10/19/21 2018 10/20/21 0607 10/20/21 0804 10/20/21 1608  BP: 138/75 134/79 (!) 143/79 122/67  Pulse: 85 93 90 72  Resp: '18 18 19 20  '$ Temp: 98.8 F (37.1 C) 98.4 F (36.9 C)    TempSrc:  Oral    SpO2: 100% 99% 99% 100%   No data found.   Intake/Output Summary (Last 24 hours) at 10/20/2021 1657 Last data filed at 10/19/2021 2020 Gross per 24 hour  Intake --  Output 100  ml  Net -100 ml   There were no vitals filed for this visit.  Exam:  GEN: NAD SKIN: Warm and dry EYES: Icteric.  EOMI ENT: MMM CV: RRR PULM: CTA B ABD: soft, obese/mildly distended, midline abdominal hernia, NT, +BS CNS: AAO x 3, non focal EXT: No edema or tenderness        Data Reviewed:   I have personally reviewed following labs and imaging studies:  Labs: Labs show the following:   Basic Metabolic Panel: Recent Labs  Lab 10/18/21 1606 10/20/21 0429  NA 139 139  K 4.0 3.5  CL 110 113*  CO2 19* 21*  GLUCOSE 132* 115*  BUN 30* 23  CREATININE 0.98 0.89  CALCIUM 8.8* 8.5*   GFR CrCl cannot be calculated (Unknown ideal  weight.). Liver Function Tests: Recent Labs  Lab 10/18/21 1606 10/19/21 0836 10/20/21 0429  AST 47* 33 44*  ALT 44 37 38  ALKPHOS 830* 721* 708*  BILITOT 11.6* 10.5* 10.7*  PROT 5.6* 5.2* 4.7*  ALBUMIN 2.4* 2.3* 2.1*   Recent Labs  Lab 10/18/21 1606  LIPASE 24   Recent Labs  Lab 10/18/21 2301  AMMONIA 36*   Coagulation profile Recent Labs  Lab 10/19/21 1535  INR 1.2    CBC: Recent Labs  Lab 10/18/21 1606 10/20/21 0429  WBC 7.2 6.6  HGB 10.2* 8.8*  HCT 29.3* 25.0*  MCV 81.4 77.9*  PLT 565* 487*   Cardiac Enzymes: No results for input(s): "CKTOTAL", "CKMB", "CKMBINDEX", "TROPONINI" in the last 168 hours. BNP (last 3 results) No results for input(s): "PROBNP" in the last 8760 hours. CBG: Recent Labs  Lab 10/19/21 1659 10/19/21 2059 10/20/21 0805 10/20/21 1213 10/20/21 1609  GLUCAP 92 145* 124* 147* 130*   D-Dimer: No results for input(s): "DDIMER" in the last 72 hours. Hgb A1c: No results for input(s): "HGBA1C" in the last 72 hours. Lipid Profile: No results for input(s): "CHOL", "HDL", "LDLCALC", "TRIG", "CHOLHDL", "LDLDIRECT" in the last 72 hours. Thyroid function studies: No results for input(s): "TSH", "T4TOTAL", "T3FREE", "THYROIDAB" in the last 72 hours.  Invalid input(s): "FREET3" Anemia work up: No results for input(s): "VITAMINB12", "FOLATE", "FERRITIN", "TIBC", "IRON", "RETICCTPCT" in the last 72 hours. Sepsis Labs: Recent Labs  Lab 10/18/21 1606 10/20/21 0429  WBC 7.2 6.6    Microbiology No results found for this or any previous visit (from the past 240 hour(s)).  Procedures and diagnostic studies:  MR ABDOMEN MRCP W WO CONTAST  Result Date: 10/19/2021 CLINICAL DATA:  Elevated bilirubin levels.  History of cirrhosis EXAM: MRI ABDOMEN WITHOUT AND WITH CONTRAST (INCLUDING MRCP) TECHNIQUE: Multiplanar multisequence MR imaging of the abdomen was performed both before and after the administration of intravenous contrast. Heavily  T2-weighted images of the biliary and pancreatic ducts were obtained, and three-dimensional MRCP images were rendered by post processing. CONTRAST:  24m GADAVIST GADOBUTROL 1 MMOL/ML IV SOLN COMPARISON:  CT AP 10/18/2021 FINDINGS: Lower chest: No acute findings. Hepatobiliary: The liver has a cirrhotic appearance with contour nodularity and hypertrophy of the caudate lobe and lateral segment of left hepatic lobe. Left lobe of liver cyst containing thin internal area of eccentric septation is noted measuring 1.3 cm. Heterogeneous enhancement is identified anterior right hepatic lobe. No definite focal enhancing liver lesions identified. There are multiple stones identified within the dependent portion of the gallbladder measuring up to 3 mm. Mild gallbladder wall thickening measures up to 3 mm which is nonspecific in the setting of cirrhosis and portal venous hypertension.  There is no intrahepatic bile duct dilatation. The CBD is upper limits of normal in caliber measuring 6 mm. No choledocholithiasis identified Pancreas: No main duct dilatation or inflammation identified. Multiple cystic lesions are identified within the uncinate process, head and tail of pancreas. -Bilobed cyst containing a internal area of hairline septation measures 2.4 x 1.5 cm within the uncinate process, image 28/4. -Within the tail of pancreas there is a bilobed cystic structure containing a thin internal area of septation measuring 1.8 cm, image 25/4. -Unilocular cyst within the anterior head of pancreas measures 7 mm, image 27/4. Spleen:  Within normal limits in size and appearance. Adrenals/Urinary Tract: Normal adrenal glands. No suspicious mass or hydronephrosis identified. Several small simple appearing cysts are noted. No follow-up imaging recommended. Stomach/Bowel: Visualized portions within the abdomen are unremarkable. Vascular/Lymphatic: Aortic atherosclerosis without aneurysm. No adenopathy. Other: There is a moderate volume of  ascites identified within the upper abdomen. Musculoskeletal: No suspicious bone lesions identified. IMPRESSION: 1. Morphologic features of the liver compatible with cirrhosis. 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 2. Gallstones. Mild gallbladder wall thickening is nonspecific in the setting of cirrhosis and portal venous hypertension. 3. No signs of biliary ductal dilatation.  No choledocholithiasis. 4. Multiple cystic lesions are identified within the uncinate process, head and tail of pancreas. The largest measures 2.4 cm. Follow-up imaging with repeat MRI in 6 months is recommended. This recommendation follows ACR consensus guidelines: Management of Incidental Pancreatic Cysts: A White Paper of the ACR Incidental Findings Committee. J Am Coll Radiol 5093;26:712-458. 5.  Aortic Atherosclerosis (ICD10-I70.0). Electronically Signed   By: Kerby Moors M.D.   On: 10/19/2021 05:18   CT Abdomen Pelvis W Contrast  Result Date: 10/18/2021 CLINICAL DATA:  Elevated ammonia level and bilirubin level with painless jaundice, initial encounter EXAM: CT ABDOMEN AND PELVIS WITH CONTRAST TECHNIQUE: Multidetector CT imaging of the abdomen and pelvis was performed using the standard protocol following bolus administration of intravenous contrast. RADIATION DOSE REDUCTION: This exam was performed according to the departmental dose-optimization program which includes automated exposure control, adjustment of the mA and/or kV according to patient size and/or use of iterative reconstruction technique. CONTRAST:  53m OMNIPAQUE IOHEXOL 300 MG/ML  SOLN COMPARISON:  MRI from 04/06/2021, ultrasound from 06/30/2021 FINDINGS: Lower chest: Lung bases are free of acute infiltrate or sizable effusion. Hepatobiliary: Gallbladder is well distended with multiple dependent gallstones. Mottled decreased attenuation is identified throughout the right lobe of the liver without discrete mass. These changes are felt to  be related to progressive cirrhotic change. Cyst is again noted in the left lobe of the liver stable in appearance from the prior exam. Pancreas: Pancreas again demonstrates cystic lesions within the head and body of the pancreas stable in appearance from the prior exam. Spleen: Normal in size without focal abnormality. Adrenals/Urinary Tract: Adrenal glands are within normal limits. Kidneys demonstrate a normal enhancement pattern bilaterally. No renal calculi or obstructive changes are noted. Bladder is well distended. Stomach/Bowel: The appendix is within normal limits. No obstructive or inflammatory changes colon are seen. Small bowel and stomach are within limits. Vascular/Lymphatic: Aortic atherosclerosis. No enlarged abdominal or pelvic lymph nodes. Reproductive: Uterus and bilateral adnexa are unremarkable. Other: Moderate ascites is noted consistent with the underlying cirrhotic change. Musculoskeletal: Degenerative changes of the lumbar spine are noted. Right hip fixation is noted. Progressive L4 compression deformity is noted but appears chronic. IMPRESSION: Mottled enhancement within the right lobe of the liver likely related to the patient's  known underlying cirrhosis. This may represent some regenerating nodules. Nonemergent MRI when the patient can tolerate adequate breath hold technique may be helpful. Cholelithiasis without complicating factors. Moderate ascites related to cirrhotic change. Cystic lesions within the pancreas stable from prior exam. Electronically Signed   By: Inez Catalina M.D.   On: 10/18/2021 22:02               LOS: 0 days   Laden Fieldhouse  [facility]   Pager on [email or web address]. If 7PM-7AM, please contact night-coverage at [email or web address]     10/20/2021, 4:57 PM

## 2021-10-20 NOTE — Progress Notes (Signed)
Patient complained of IV site being very painful and asked RN to remove IV. Patient aware that she would likely need another IV started as physician would prefer to keep an IV in. IV team ultimately consulted after failed IV attempt by this RN. IV team aware of MD requesting new IV site. According to IV team RN, no IV is indicated at this time as no IV medications are ordered and placing an IV increases risk of infection. MD aware of this conversation and patient remains without an IV.

## 2021-10-20 NOTE — Progress Notes (Signed)
Discussed with nurse that patient not receiving anything at this time and PIV not needed. Educated that there is no procedure that states every patient must have a PIV. If access is needed later please consult the Vascular Team if needed.

## 2021-10-20 NOTE — NC FL2 (Signed)
[place] LEVEL OF CARE SCREENING TOOL     IDENTIFICATION  Patient Name: Nancy Rodriguez Birthdate: [birth year] Sex: female Admission Date (Current Location): 10/18/2021  [facility] and [place] Number:  Engineering geologist and Address:  [facility], [address], [place], [place] [address]      Provider Number: [id number]  Attending Physician Name and Address:  Jennye Boroughs, MD  Relative Name and Phone Number:  Hannalee, Castor (Daughter)   [phone] (Mobile)    Current Level of Care: Hospital Recommended Level of Care: [place] Prior Approval Number:    Date Approved/Denied:   PASRR Number: [id number]  Discharge Plan:      Current Diagnoses: Patient Active Problem List   Diagnosis Date Noted   Elevated bilirubin 10/19/2021   Malnutrition of moderate degree 09/28/2021   AKI (acute kidney injury) ([place]) 09/28/2021   Acute postoperative anemia due to expected blood loss 09/27/2021   Delirium due to multiple etiologies, persistent, hypoactive 09/27/2021   Hypertensive urgency 09/26/2021   Diabetes mellitus without complication ([place])    Chronic kidney disease, stage 3a ([place])    Fracture of femoral shaft, right, closed ([place])    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[place]) 08/21/2021   Subdural hematoma ([place]) 05/18/2021   Hypoalbuminemia 04/27/2021   Vaginal bleeding 04/27/2021   Hepatic cirrhosis (HCC) 04/27/2021   Hyperbilirubinemia 04/27/2021   IDA (iron deficiency anemia) 02/16/2021   Gastroesophageal reflux disease 11/10/2020   Lower extremity edema 10/14/2020   Elevated LFTs 10/12/2020   Loss of weight 10/12/2020   Anasarca 09/04/2020   Edema 08/13/2020   Multiple myeloma ([place]) 08/13/2020   Chemotherapy induced nausea and vomiting 07/27/2020   Hypokalemia 07/27/2020   Goals of care, counseling/discussion 07/20/2020   Microcytic anemia 07/06/2020   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[place])  07/06/2020   Abnormal EKG 07/01/2020   Essential hypertension 07/01/2020   Hyperlipidemia associated with type 2 diabetes mellitus ([place]) 07/01/2020   Encounter for antineoplastic chemotherapy 06/17/2020   Transaminitis 06/17/2020   Hyperglycemia 06/17/2020   Amyloidosis ([place]) 05/20/2020    Orientation RESPIRATION BLADDER Height & Weight     Time, Self, Situation, Place  Normal Incontinent Weight:   Height:     BEHAVIORAL SYMPTOMS/MOOD NEUROLOGICAL BOWEL NUTRITION STATUS      Incontinent Diet (heart healthy/carb modified)  AMBULATORY STATUS COMMUNICATION OF NEEDS Skin   Limited Assist Verbally Surgical wounds (pressure wound medical coccyx; surgical incision x 3 lateral R hip and knee)                       Personal Care Assistance Level of Assistance  Bathing, Feeding, Dressing Bathing Assistance: Limited assistance Feeding assistance: Limited assistance Dressing Assistance: Limited assistance     Functional Limitations Info             SPECIAL CARE FACTORS FREQUENCY  PT (By licensed PT), OT (By licensed OT)     PT Frequency: 5 times per week OT Frequency: 5 times per week            Contractures      Additional Factors Info  Code Status, Allergies Code Status Info: full Allergies Info: Benazepril, Lisinopril, Tolmetin, Nsaids           Current Medications (10/20/2021):  This is the current hospital active medication list Current Facility-Administered Medications  Medication Dose Route Frequency Provider Last Rate Last Admin   amLODipine (NORVASC) tablet 5 mg  5 mg Oral Daily Judd Gaudier V, MD   5 mg at 10/20/21 0901   hydrALAZINE (APRESOLINE) injection 5 mg  5 mg Intravenous Q4H PRN Athena Masse, MD       insulin aspart (novoLOG) injection 0-9 Units  0-9 Units Subcutaneous TID AC & HS Jennye Boroughs, MD   1 Units at 10/20/21 1216   metoprolol succinate (TOPROL-XL) 24 hr tablet 50 mg  50 mg Oral Daily Judd Gaudier V, MD   50 mg at 10/20/21 0901    morphine (PF) 2 MG/ML injection 2 mg  2 mg Intravenous Q2H PRN Athena Masse, MD       ondansetron [facility]) tablet 4 mg  4 mg Oral Q6H PRN Athena Masse, MD       Or   ondansetron [facility]) injection 4 mg  4 mg Intravenous Q6H PRN Athena Masse, MD       oxyCODONE (Oxy IR/ROXICODONE) immediate release tablet 5 mg  5 mg Oral Q6H PRN [name], MD       sertraline (ZOLOFT) tablet 50 mg  50 mg Oral Daily Athena Masse, MD   50 mg at 10/20/21 0901     Discharge Medications: Please see discharge summary for a list of discharge medications.  Relevant Imaging Results:  Relevant Lab Results:   Additional Information SS #: [id number]  [place], LCSW

## 2021-10-20 NOTE — Evaluation (Signed)
Physical Therapy Evaluation Patient Details Name: Nancy Rodriguez MRN: [id number] DOB: Jan 04, 1946 Today's Date: 10/20/2021  History of Present Illness  Pt is a 76 yo that presented to ED from SNF due to elevated bilirubin per her oncologist. 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   Clinical Impression  Pt alert in recliner, reported 5/10 pain in R hip. Oriented to self, place, date, some confusion over situation but able to follow all commands. Pt reported SNF rehab was going well, wants to return to PLOF (previously ambulatory with RW, assist from family with IADLs).  The patient was able to perform a few seated exercises, challenged by R hip marching and LAQ. Sit <> stand with minA and RW. She ambulated ~57f but appeared to fatigue with decreased step height/length and antalgia on RLE. Returned to recliner with all needs in reach.  Overall the patient demonstrated deficits (see "PT Problem List") that impede the patient's functional abilities, safety, and mobility and would benefit from skilled PT intervention. Recommendation at this time is SNF to maximize function, safety, and independence.        Recommendations for follow up therapy are one component of a multi-disciplinary discharge planning process, led by the attending physician.  Recommendations may be updated based on patient status, additional functional criteria and insurance authorization.  Follow Up Recommendations Skilled nursing-short term rehab (<3 hours/day) Can patient physically be transported by private vehicle: Yes    Assistance Recommended at Discharge Intermittent Supervision/Assistance  Patient can return home with the following  A little help with walking and/or transfers;A little help with  bathing/dressing/bathroom;Assistance with cooking/housework;Assist for transportation;Help with stairs or ramp for entrance    Equipment Recommendations None recommended by PT  Recommendations for Other Services       Functional Status Assessment Patient has had a recent decline in their functional status and demonstrates the ability to make significant improvements in function in a reasonable and predictable amount of time.     Precautions / Restrictions Precautions Precautions: Fall Restrictions Weight Bearing Restrictions: Yes RLE Weight Bearing: Weight bearing as tolerated      Mobility  Bed Mobility               General bed mobility comments: pt up, sitting in recliner at start/end of session    Transfers Overall transfer level: Needs assistance Equipment used: Rolling walker (2 wheels) Transfers: Sit to/from Stand Sit to Stand: Min assist                Ambulation/Gait Ambulation/Gait assistance: Min guard Gait Distance (Feet): 20 Feet Assistive device: Rolling walker (2 wheels) Gait Pattern/deviations: Step-to pattern, Decreased step length - right, Decreased step length - left, Decreased stance time - right Gait velocity: decreased     General Gait Details: slow antaligic gait w/ no LOB; pt did fatigue quickly  Stairs            Wheelchair Mobility    Modified Rankin (Stroke Patients Only)       Balance Overall balance assessment: Needs assistance Sitting-balance support: Feet supported, Bilateral upper extremity supported Sitting balance-Leahy Scale: Fair       Standing balance-Leahy Scale: Fair Standing balance comment: CGA with static standing  Pertinent Vitals/Pain Pain Assessment Pain Assessment: 0-10 Pain Score: 5  Pain Location: R hip Pain Descriptors / Indicators: Grimacing, Shooting, Tingling Pain Intervention(s): Monitored during session, Limited activity within patient's  tolerance, Repositioned    Home Living Family/patient expects to be discharged to:: Private residence Living Arrangements: Children Available Help at Discharge: Family;Available 24 hours/day Type of Home: House Home Access: Stairs to enter Entrance Stairs-Rails: Right;Left;Can reach both Entrance Stairs-Number of Steps: 3   Home Layout: One level Home Equipment: Conservation officer, nature (2 wheels);Cane - quad;Shower seat;Grab bars - tub/shower;BSC/3in1      Prior Function Prior Level of Function : Needs assist;History of Falls (last six months)             Mobility Comments: Until recently has been ambulating with quad cane; recently changed to RW for increased stability. ADLs Comments: Daughter provides Min A for bathing and assists with all IADL. Pt reports ~ 7 falls in previous 6 months.     Hand Dominance        Extremity/Trunk Assessment   Upper Extremity Assessment Upper Extremity Assessment: Generalized weakness    Lower Extremity Assessment Lower Extremity Assessment: Generalized weakness       Communication   Communication: No difficulties  Cognition Arousal/Alertness: Awake/alert Behavior During Therapy: WFL for tasks assessed/performed Overall Cognitive Status: Within Functional Limits for tasks assessed                                 General Comments: Pt more alert, better oriented today.        General Comments      Exercises     Assessment/Plan    PT Assessment Patient needs continued PT services  PT Problem List Decreased strength;Decreased range of motion;Decreased activity tolerance;Decreased balance;Decreased mobility;Decreased coordination;Decreased cognition;Decreased safety awareness;Decreased knowledge of use of DME;Decreased knowledge of precautions       PT Treatment Interventions DME instruction;Balance training;Gait training;Stair training;Functional mobility training;Therapeutic activities;Therapeutic  exercise;Patient/family education;Neuromuscular re-education    PT Goals (Current goals can be found in the Care Plan section)  Acute Rehab PT Goals Patient Stated Goal: to feel better PT Goal Formulation: With patient Time For Goal Achievement: 11/03/21 Potential to Achieve Goals: Good    Frequency Min 2X/week     Co-evaluation               AM-PAC PT "6 Clicks" Mobility  Outcome Measure Help needed turning from your back to your side while in a flat bed without using bedrails?: A Little Help needed moving from lying on your back to sitting on the side of a flat bed without using bedrails?: A Little Help needed moving to and from a bed to a chair (including a wheelchair)?: A Little Help needed standing up from a chair using your arms (e.g., wheelchair or bedside chair)?: A Little Help needed to walk in hospital room?: A Little Help needed climbing 3-5 steps with a railing? : A Lot 6 Click Score: 17    End of Session Equipment Utilized During Treatment: Gait belt Activity Tolerance: Patient tolerated treatment well Patient left: in chair;with chair alarm set;with call bell/phone within reach Nurse Communication: Mobility status PT Visit Diagnosis: Difficulty in walking, not elsewhere classified (R26.2);Other abnormalities of gait and mobility (R26.89);Other symptoms and signs involving the nervous system (R29.898);Repeated falls (R29.6);Muscle weakness (generalized) (M62.81)    Time: 4696-2952 PT Time Calculation (min) (ACUTE ONLY): 14 min   Charges:  PT Evaluation $PT Eval Low Complexity: 1 Low PT Treatments $Therapeutic Activity: 8-22 mins       Lieutenant Diego PT, DPT 12:36 PM,10/20/21

## 2021-10-20 NOTE — Evaluation (Signed)
Occupational Therapy Evaluation Patient Details Name: Nancy Rodriguez MRN: [id number] DOB: [birth year] Today's Date: 10/20/2021   History of Present Illness Pt is a 76 yo that presented to ED from SNF due to elevated bilirubin per her oncologist. 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   Clinical Impression   [name] presents with generalized weakness, limited endurance, impaired balance, fatigue, and mild pain. She comes to [facility] from [facility], where she has been for rehab following a R femoral shaft fx on 09/26/21. Prior to fx, pt lived in a single story home, 3 STE, with her daughter, [name], and was largely Mod I in ADL, ambulating with a RW but reporting 7 or more falls in previous 6 months, and with [name] assisting with IADL. During today's evaluation, pt's fxl mobility was much improved compared to her June hospitalization. She was able to ambulate to bathroom, sit down and stand up from toilet, wash hands standing at sink with no UE support, demonstrate sit<>stand transfers X 4 without physical assist, and to transfer sit<supine in bed, all with SUPV-CGA, but with complaint of significant fatigue following OOB mobility. Pt expresses her desire to return home, discussed this possibility with pt and family, but ultimately believe pt would be better served by continuing her rehab at a SNF. Reassured pt that she had made good progress at [facility] and that DC back there would not be a long-term option but only to continue building strength and improving balance following fx, allowing her to return to PLOF. Pt unhappy with this recommendation but states that she will do "what my kids think is best."      Recommendations for follow up therapy are one component of a multi-disciplinary discharge planning  process, led by the attending physician.  Recommendations may be updated based on patient status, additional functional criteria and insurance authorization.   Follow Up Recommendations  Skilled nursing-short term rehab (<3 hours/day)    Assistance Recommended at Discharge Frequent or constant Supervision/Assistance  Patient can return home with the following A little help with walking and/or transfers;Assistance with cooking/housework;Help with stairs or ramp for entrance;A little help with bathing/dressing/bathroom    Functional Status Assessment  Patient has had a recent decline in their functional status and demonstrates the ability to make significant improvements in function in a reasonable and predictable amount of time.  Equipment Recommendations  BSC/3in1    Recommendations for Other Services       Precautions / Restrictions Precautions Precautions: Fall Restrictions Weight Bearing Restrictions: Yes RLE Weight Bearing: Weight bearing as tolerated      Mobility Bed Mobility Overal bed mobility: Needs Assistance         Sit to supine: Min guard   General bed mobility comments: Min A for repositioning toward HOB from supine position    Transfers Overall transfer level: Needs assistance Equipment used: Rolling walker (2 wheels) Transfers: Sit to/from Stand Sit to Stand: Min guard     Step pivot transfers: Min guard    Lateral/Scoot Transfers: Supervision        Balance Overall balance assessment: Needs assistance Sitting-balance support: Single extremity supported, Feet supported Sitting balance-Leahy Scale: Good     Standing balance support: Bilateral upper extremity supported, Reliant on assistive device for balance, During functional activity Standing balance-Leahy Scale:  Fair Standing balance comment: CGA with static standing                           ADL either performed or assessed with clinical judgement   ADL Overall ADL's : Needs  assistance/impaired     Grooming: Wash/dry hands;Sitting;Supervision/safety                   Toilet Transfer: Supervision/safety;Regular Toilet;Rolling walker (2 wheels)   Toileting- Clothing Manipulation and Hygiene: Moderate assistance;Sit to/from stand       Functional mobility during ADLs: Rolling walker (2 wheels);Min guard       Vision         Perception     Praxis      Pertinent Vitals/Pain Pain Assessment Pain Assessment: 0-10 Pain Score: 3  Pain Location: R hip Pain Intervention(s): Limited activity within patient's tolerance, Repositioned, Utilized relaxation techniques     Hand Dominance     Extremity/Trunk Assessment Upper Extremity Assessment Upper Extremity Assessment: Generalized weakness   Lower Extremity Assessment Lower Extremity Assessment: Generalized weakness       Communication Communication Communication: No difficulties   Cognition Arousal/Alertness: Awake/alert Behavior During Therapy: WFL for tasks assessed/performed Overall Cognitive Status: Within Functional Limits for tasks assessed                                 General Comments: Alert and oriented, but appears somewhat "down," depressed     General Comments       Exercises Other Exercises Other Exercises: Educ re: home safety, falls prevention, PoC, DC options   Shoulder Instructions      Home Living Family/patient expects to be discharged to:: Private residence Living Arrangements: Children Available Help at Discharge: Family;Available 24 hours/day Type of Home: House Home Access: Stairs to enter [employer] of Steps: 3 Entrance Stairs-Rails: Right;Left;Can reach both Home Layout: One level     Bathroom Shower/Tub: Teacher, early years/pre: Standard     Home Equipment: Conservation officer, nature (2 wheels);Cane - quad;Shower seat;Grab bars - tub/shower;BSC/3in1          Prior Functioning/Environment Prior Level of  Function : Needs assist;History of Falls (last six months)             Mobility Comments: Until recently has been ambulating with quad cane; recently changed to RW for increased stability. ADLs Comments: Daughter provides Min A for bathing and assists with all IADL. Pt reports ~ 7 falls in previous 6 months.        OT Problem List: Decreased strength;Decreased range of motion;Decreased activity tolerance;Impaired balance (sitting and/or standing);Pain      OT Treatment/Interventions: Self-care/ADL training;Therapeutic exercise;Patient/family education;Energy conservation;Therapeutic activities;Balance training;DME and/or AE instruction    OT Goals(Current goals can be found in the care plan section) Acute Rehab OT Goals Patient Stated Goal: to go home OT Goal Formulation: With patient Time For Goal Achievement: 11/03/21 Potential to Achieve Goals: Good ADL Goals Pt Will Transfer to Toilet: with supervision;regular height toilet;bedside commode (using LRAD) Pt/caregiver will Perform Home Exercise Program: Increased ROM;Increased strength (for increased strength, flexibility, and improved balance) Additional ADL Goal #1: Pt will identify/demonstrate 2+ falls prevention techniques  OT Frequency: Min 2X/week    Co-evaluation              AM-PAC OT "6 Clicks" Daily Activity     Outcome Measure Help  from another person eating meals?: None Help from another person taking care of personal grooming?: A Little Help from another person toileting, which includes using toliet, bedpan, or urinal?: A Little Help from another person bathing (including washing, rinsing, drying)?: A Little Help from another person to put on and taking off regular upper body clothing?: A Little Help from another person to put on and taking off regular lower body clothing?: A Lot 6 Click Score: 18   End of Session Equipment Utilized During Treatment: Rolling walker (2 wheels)  Activity Tolerance: Patient  tolerated treatment well Patient left: in bed;with call bell/phone within reach;with family/visitor present;with bed alarm set  OT Visit Diagnosis: Unsteadiness on feet (R26.81);Muscle weakness (generalized) (M62.81);History of falling (Z91.81)                Time: 5465-6812 OT Time Calculation (min): 50 min Charges:  OT General Charges $OT Visit: 1 Visit OT Evaluation $OT Eval Moderate Complexity: 1 Mod OT Treatments $Self Care/Home Management : 38-52 mins Josiah Lobo, PhD, MS, OTR/L 10/20/21, 5:01 PM

## 2021-10-20 NOTE — Progress Notes (Signed)
Hematology/Oncology Progress note [phone] [phone]     Patient Care Team: Leonel Ramsay, MD as PCP - General (Infectious Diseases) End, [name], MD as PCP - Cardiology (Cardiology) [name], MD as Consulting Physician (Oncology)   Name of the patient: Nancy Rodriguez  [id number]  03/18/1946  Date of visit: 10/20/21   INTERVAL HISTORY-   Patient sits in the chair.  Denies any nausea vomiting.  No acute overnight events.  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    Priority: Medium    IDA (iron deficiency anemia) 02/16/2021    Priority: Medium    Goals of care, counseling/discussion 07/20/2020    Priority: Medium    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[place]) 07/06/2020    Priority: Medium    Encounter for antineoplastic chemotherapy 06/17/2020    Priority: Medium    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    Priority: Low   Hyperglycemia 06/17/2020    Priority: Low   Palliative care encounter    Elevated bilirubin 10/19/2021   Malnutrition of moderate degree 09/28/2021   AKI (acute kidney injury) ([place]) 09/28/2021   Acute postoperative anemia due to expected blood loss 09/27/2021   Delirium due to multiple etiologies, persistent, hypoactive 09/27/2021   Hypertensive urgency 09/26/2021   Diabetes mellitus without complication ([place])    Chronic kidney disease, stage 3a ([place])    Fracture of femoral shaft, right, closed ([place])    Subdural hematoma ([place]) 05/18/2021   Hypoalbuminemia 04/27/2021   Vaginal bleeding 04/27/2021   Hepatic cirrhosis ([place]) 04/27/2021   Hyperbilirubinemia 04/27/2021   Gastroesophageal reflux disease 11/10/2020   Lower extremity edema 10/14/2020   Elevated LFTs  10/12/2020   Loss of weight 10/12/2020   Anasarca 09/04/2020   Edema 08/13/2020   Multiple myeloma ([place]) 08/13/2020   Chemotherapy induced nausea and vomiting 07/27/2020   Hypokalemia 07/27/2020   Microcytic anemia 07/06/2020   Abnormal EKG 07/01/2020   Essential hypertension 07/01/2020   Hyperlipidemia associated with type 2 diabetes mellitus ([place]) 07/01/2020   Transaminitis 06/17/2020     Past Medical History:  Diagnosis Date   (HFpEF) heart failure with preserved ejection fraction ([place])    a. 05/2020 Echo: EF 60-65%; b. 08/2020 cMRI ([facility]): EF 71%, no delayed hyperenhancement to suggest scar/infiltrative dzs. Nl RV fxn. Mild BAE. Triv MR, mild TR; c. 06/2021 Echo: EF 50-55%, no rwma, mild basal-septal LVH, GrII DD. Nl RV size/fxn. Mild MR. Ao sclerosis.   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 03/2012 Stress Echo([facility]): nl study.   Hypercholesterolemia    Hypertension    MGUS (monoclonal gammopathy of unknown significance)    Osteoarthritis    Rheumatoid arthritis ([place])      Past Surgical History:  Procedure Laterality Date   HERNIA REPAIR     INTRAMEDULLARY (IM) NAIL INTERTROCHANTERIC Right 09/26/2021   Procedure: INTRAMEDULLARY (IM) NAIL INTERTROCHANTRIC;  Surgeon: Thornton Park, MD;  Location: [facility] ORS;  Service: Orthopedics;  Laterality:  Right;   PARATHYROIDECTOMY      Social History   Socioeconomic History   Marital status: Widowed    Spouse name: Not on file   Number of children: 6   Years of education: Not on file   Highest education level: Not on file  Occupational History   Not on file  Tobacco Use   Smoking status: Never   Smokeless tobacco: Never  Vaping Use   Vaping Use: Never used  Substance and Sexual Activity   Alcohol use: No   Drug use: Never    Sexual activity: Not on file  Other Topics Concern   Not on file  Social History Narrative   Not on file   Social Determinants of Health   Financial Resource Strain: Not on file  Food Insecurity: Not on file  Transportation Needs: Not on file  Physical Activity: Not on file  Stress: Not on file  Social Connections: Not on file  Intimate Partner Violence: Not on file     Family History  Problem Relation Age of Onset   Diabetes Daughter    Diabetes Son    Dementia Mother    Cancer Father    Esophageal cancer Sister    Brain cancer Brother      Current Facility-Administered Medications:    amLODipine (NORVASC) tablet 5 mg, 5 mg, Oral, Daily, Judd Gaudier V, MD, 5 mg at 10/20/21 0901   heparin injection 5,000 Units, 5,000 Units, Subcutaneous, Q8H, Jennye Boroughs, MD   hydrALAZINE (APRESOLINE) injection 5 mg, 5 mg, Intravenous, Q4H PRN, Athena Masse, MD   insulin aspart (novoLOG) injection 0-9 Units, 0-9 Units, Subcutaneous, TID AC & HS, Jennye Boroughs, MD, 1 Units at 10/20/21 1625   metoprolol succinate (TOPROL-XL) 24 hr tablet 50 mg, 50 mg, Oral, Daily, Judd Gaudier V, MD, 50 mg at 10/20/21 0901   morphine (PF) 2 MG/ML injection 2 mg, 2 mg, Intravenous, Q2H PRN, Athena Masse, MD   ondansetron (ZOFRAN) tablet 4 mg, 4 mg, Oral, Q6H PRN **OR** ondansetron (ZOFRAN) injection 4 mg, 4 mg, Intravenous, Q6H PRN, Judd Gaudier V, MD   oxyCODONE (Oxy IR/ROXICODONE) immediate release tablet 5 mg, 5 mg, Oral, Q6H PRN, [name], MD   sertraline (ZOLOFT) tablet 50 mg, 50 mg, Oral, Daily, Judd Gaudier V, MD, 50 mg at 10/20/21 0901   Physical exam:  Vitals:   10/20/21 0607 10/20/21 0804 10/20/21 1608 10/20/21 1925  BP: 134/79 (!) 143/79 122/67 (!) 120/54  Pulse: 93 90 72 77  Resp: $Remo'18 19 20 18  'nMfRc$ Temp: 98.4 F (36.9 C)   98.7 F (37.1 C)  TempSrc: Oral     SpO2: 99% 99% 100% 99%   Physical Exam Constitutional:      Appearance: She is ill-appearing.  HENT:     Head:  Normocephalic and atraumatic.  Cardiovascular:     Rate and Rhythm: Normal rate.  Pulmonary:     Effort: Pulmonary effort is normal. No respiratory distress.  Abdominal:     General: There is distension.  Musculoskeletal:        General: No tenderness.     Cervical back: Normal range of motion.  Skin:    General: Skin is warm.  Neurological:     General: No focal deficit present.     Mental Status: She is alert. Mental status is at baseline.  Psychiatric:        Mood and Affect: Mood normal.     Labs  Latest Ref Rng & Units 10/20/2021    4:29 AM 10/18/2021    4:06 PM 10/03/2021    3:00 AM  CBC  WBC 4.0 - 10.5 K/uL 6.6  7.2  10.5   Hemoglobin 12.0 - 15.0 g/dL 8.8  10.2  9.0   Hematocrit 36.0 - 46.0 % 25.0  29.3  26.2   Platelets 150 - 400 K/uL 487  565  353       Latest Ref Rng & Units 10/20/2021    4:29 AM 10/19/2021    8:36 AM 10/18/2021    4:06 PM  CMP  Glucose 70 - 99 mg/dL 115   132   BUN 8 - 23 mg/dL 23   30   Creatinine 0.44 - 1.00 mg/dL 0.89   0.98   Sodium 135 - 145 mmol/L 139   139   Potassium 3.5 - 5.1 mmol/L 3.5   4.0   Chloride 98 - 111 mmol/L 113   110   CO2 22 - 32 mmol/L 21   19   Calcium 8.9 - 10.3 mg/dL 8.5   8.8   Total Protein 6.5 - 8.1 g/dL 4.7  5.2  5.6   Total Bilirubin 0.3 - 1.2 mg/dL 10.7  10.5  11.6   Alkaline Phos 38 - 126 U/L 708  721  830   AST 15 - 41 U/L 44  33  47   ALT 0 - 44 U/L 38  37  44       RADIOGRAPHIC STUDIES: I have personally reviewed the radiological images as listed and agreed with the findings in the report. MR ABDOMEN MRCP W WO CONTAST  Result Date: 10/19/2021 CLINICAL DATA:  Elevated bilirubin levels.  History of cirrhosis EXAM: MRI ABDOMEN WITHOUT AND WITH CONTRAST (INCLUDING MRCP) TECHNIQUE: Multiplanar multisequence MR imaging of the abdomen was performed both before and after the administration of intravenous contrast. Heavily T2-weighted images of the biliary and pancreatic ducts were obtained, and three-dimensional  MRCP images were rendered by post processing. CONTRAST:  64mL GADAVIST GADOBUTROL 1 MMOL/ML IV SOLN COMPARISON:  CT AP 10/18/2021 FINDINGS: Lower chest: No acute findings. Hepatobiliary: The liver has a cirrhotic appearance with contour nodularity and hypertrophy of the caudate lobe and lateral segment of left hepatic lobe. Left lobe of liver cyst containing thin internal area of eccentric septation is noted measuring 1.3 cm. Heterogeneous enhancement is identified anterior right hepatic lobe. No definite focal enhancing liver lesions identified. There are multiple stones identified within the dependent portion of the gallbladder measuring up to 3 mm. Mild gallbladder wall thickening measures up to 3 mm which is nonspecific in the setting of cirrhosis and portal venous hypertension. There is no intrahepatic bile duct dilatation. The CBD is upper limits of normal in caliber measuring 6 mm. No choledocholithiasis identified Pancreas: No main duct dilatation or inflammation identified. Multiple cystic lesions are identified within the uncinate process, head and tail of pancreas. -Bilobed cyst containing a internal area of hairline septation measures 2.4 x 1.5 cm within the uncinate process, image 28/4. -Within the tail of pancreas there is a bilobed cystic structure containing a thin internal area of septation measuring 1.8 cm, image 25/4. -Unilocular cyst within the anterior head of pancreas measures 7 mm, image 27/4. Spleen:  Within normal limits in size and appearance. Adrenals/Urinary Tract: Normal adrenal glands. No suspicious mass or hydronephrosis identified. Several small simple appearing cysts are noted. No follow-up imaging recommended. Stomach/Bowel: Visualized portions within the abdomen are unremarkable. Vascular/Lymphatic:  Aortic atherosclerosis without aneurysm. No adenopathy. Other: There is a moderate volume of ascites identified within the upper abdomen. Musculoskeletal: No suspicious bone lesions  identified. IMPRESSION: 1. Morphologic features of the liver compatible with cirrhosis. 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 2. Gallstones. Mild gallbladder wall thickening is nonspecific in the setting of cirrhosis and portal venous hypertension. 3. No signs of biliary ductal dilatation.  No choledocholithiasis. 4. Multiple cystic lesions are identified within the uncinate process, head and tail of pancreas. The largest measures 2.4 cm. Follow-up imaging with repeat MRI in 6 months is recommended. This recommendation follows ACR consensus guidelines: Management of Incidental Pancreatic Cysts: A White Paper of the ACR Incidental Findings Committee. J Am Coll Radiol 6203;55:974-163. 5.  Aortic Atherosclerosis (ICD10-I70.0). Electronically Signed   By: Kerby Moors M.D.   On: 10/19/2021 05:18   CT Abdomen Pelvis W Contrast  Result Date: 10/18/2021 CLINICAL DATA:  Elevated ammonia level and bilirubin level with painless jaundice, initial encounter EXAM: CT ABDOMEN AND PELVIS WITH CONTRAST TECHNIQUE: Multidetector CT imaging of the abdomen and pelvis was performed using the standard protocol following bolus administration of intravenous contrast. RADIATION DOSE REDUCTION: This exam was performed according to the departmental dose-optimization program which includes automated exposure control, adjustment of the mA and/or kV according to patient size and/or use of iterative reconstruction technique. CONTRAST:  76mL OMNIPAQUE IOHEXOL 300 MG/ML  SOLN COMPARISON:  MRI from 04/06/2021, ultrasound from 06/30/2021 FINDINGS: Lower chest: Lung bases are free of acute infiltrate or sizable effusion. Hepatobiliary: Gallbladder is well distended with multiple dependent gallstones. Mottled decreased attenuation is identified throughout the right lobe of the liver without discrete mass. These changes are felt to be related to progressive cirrhotic change. Cyst is again noted in the left lobe of the  liver stable in appearance from the prior exam. Pancreas: Pancreas again demonstrates cystic lesions within the head and body of the pancreas stable in appearance from the prior exam. Spleen: Normal in size without focal abnormality. Adrenals/Urinary Tract: Adrenal glands are within normal limits. Kidneys demonstrate a normal enhancement pattern bilaterally. No renal calculi or obstructive changes are noted. Bladder is well distended. Stomach/Bowel: The appendix is within normal limits. No obstructive or inflammatory changes colon are seen. Small bowel and stomach are within limits. Vascular/Lymphatic: Aortic atherosclerosis. No enlarged abdominal or pelvic lymph nodes. Reproductive: Uterus and bilateral adnexa are unremarkable. Other: Moderate ascites is noted consistent with the underlying cirrhotic change. Musculoskeletal: Degenerative changes of the lumbar spine are noted. Right hip fixation is noted. Progressive L4 compression deformity is noted but appears chronic. IMPRESSION: Mottled enhancement within the right lobe of the liver likely related to the patient's known underlying cirrhosis. This may represent some regenerating nodules. Nonemergent MRI when the patient can tolerate adequate breath hold technique may be helpful. Cholelithiasis without complicating factors. Moderate ascites related to cirrhotic change. Cystic lesions within the pancreas stable from prior exam. Electronically Signed   By: Inez Catalina M.D.   On: 10/18/2021 22:02   DG Wrist 2 Views Left  Result Date: 09/27/2021 CLINICAL DATA:  Left wrist pain after fall yesterday. EXAM: LEFT WRIST - 2 VIEW COMPARISON:  None available FINDINGS: There is 1 mm ulnar negative variance. Moderate to severe thumb carpometacarpal and moderate triscaphe joint space narrowing and peripheral osteophytosis. Mild-to-moderate second carpometacarpal joint space narrowing and peripheral osteophytosis. Moderate distal radial styloid degenerative spur contacts a  degenerative spur at the distal lateral aspect of the scaphoid. Bone overlap in different  regions of the scaphoid on frontal and lateral views limits evaluation for an acute fracture. This includes within the region of the distal lateral aspect of the scaphoid where a prominent degenerative spur overlies the distal radial styloid. There is decreased scaphoid bone mineralization in this region with multiple lucent likely bone nutrient foramina. No definite acute fracture is visualized. IMPRESSION: 1. 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 2. Moderate degenerative changes of the distal radial styloid and adjacent distal lateral aspect of the scaphoid. 3. No definite acute fracture is seen. If there is there is snuffbox tenderness and clinical concern for a radiographically occult fracture, consider splinting and follow-up radiographs in 10 days. Electronically Signed   By: Yvonne Kendall M.D.   On: 09/27/2021 11:00   DG FEMUR, MIN 2 VIEWS RIGHT  Result Date: 09/26/2021 CLINICAL DATA:  Status post right IM nail fixation EXAM: RIGHT FEMUR 2 VIEWS; PORTABLE PELVIS 1-2 VIEWS COMPARISON:  09/26/2021 intraoperative images and hip radiographs FINDINGS: Status post intramedullary nail fixation of a previously noted proximal right femoral diaphyseal fracture with improved alignment of the fracture components. No perihardware lucency or additional acute fracture in the pelvis or right femur. Air within the soft tissues, not unexpected post surgically. Superficial skin staples. IMPRESSION: Expected postoperative appearance, status post intramedullary nail fixation of a right femoral fracture. Electronically Signed   By: Merilyn Baba M.D.   On: 09/26/2021 17:38   Pelvis Portable  Result Date: 09/26/2021 CLINICAL DATA:  Status post right IM nail fixation EXAM: RIGHT FEMUR 2 VIEWS; PORTABLE PELVIS 1-2 VIEWS COMPARISON:  09/26/2021 intraoperative images and hip radiographs FINDINGS:  Status post intramedullary nail fixation of a previously noted proximal right femoral diaphyseal fracture with improved alignment of the fracture components. No perihardware lucency or additional acute fracture in the pelvis or right femur. Air within the soft tissues, not unexpected post surgically. Superficial skin staples. IMPRESSION: Expected postoperative appearance, status post intramedullary nail fixation of a right femoral fracture. Electronically Signed   By: Merilyn Baba M.D.   On: 09/26/2021 17:38   DG FEMUR, MIN 2 VIEWS RIGHT  Result Date: 09/26/2021 CLINICAL DATA:  Right proximal femoral ORIF EXAM: RIGHT FEMUR 2 VIEWS COMPARISON:  Pelvis and right hip radiographs 09/26/2021 FINDINGS: Images were performed intraoperatively without the presence of a radiologist. Redemonstration of oblique proximal right femoral diaphyseal fracture. New long-stem cephalo medullary nail fixation of this fracture with improved now anatomic alignment. No complication visualized. Total fluoroscopy images: 6 Total fluoroscopy time: 183 seconds Please see intraoperative findings for further detail. IMPRESSION: Intraoperative fluoroscopy for proximal right femoral ORIF. Electronically Signed   By: Yvonne Kendall M.D.   On: 09/26/2021 17:04   DG C-Arm 1-60 Min-No Report  Result Date: 09/26/2021 Fluoroscopy was utilized by the requesting physician.  No radiographic interpretation.   DG C-Arm 1-60 Min-No Report  Result Date: 09/26/2021 Fluoroscopy was utilized by the requesting physician.  No radiographic interpretation.   DG C-Arm 1-60 Min-No Report  Result Date: 09/26/2021 Fluoroscopy was utilized by the requesting physician.  No radiographic interpretation.   DG Chest Portable 1 View  Result Date: 09/26/2021 CLINICAL DATA:  Fall, preop EXAM: PORTABLE CHEST 1 VIEW COMPARISON:  None Available. FINDINGS: Elevation of the right hemidiaphragm. Heart and mediastinal contours are within normal limits. No focal  opacities or effusions. No acute bony abnormality. IMPRESSION: No active disease. Electronically Signed   By: Rolm Baptise M.D.   On: 09/26/2021 03:11   DG Hip Unilat W  or Wo Pelvis 2-3 Views Right  Result Date: 09/26/2021 CLINICAL DATA:  Fall EXAM: DG HIP (WITH OR WITHOUT PELVIS) 2-3V RIGHT COMPARISON:  None Available. FINDINGS: There is a fracture through the proximal shaft of the right femur. Femoral shaft is displaced medially relative to the femoral neck. No subluxation or dislocation at the hip. IMPRESSION: Displaced right proximal femoral shaft fracture. Electronically Signed   By: Rolm Baptise M.D.   On: 09/26/2021 03:07   CT Cervical Spine Wo Contrast  Result Date: 09/26/2021 CLINICAL DATA:  Facial trauma, blunt.  Unwitnessed fall. EXAM: CT CERVICAL SPINE WITHOUT CONTRAST TECHNIQUE: Multidetector CT imaging of the cervical spine was performed without intravenous contrast. Multiplanar CT image reconstructions were also generated. RADIATION DOSE REDUCTION: This exam was performed according to the departmental dose-optimization program which includes automated exposure control, adjustment of the mA and/or kV according to patient size and/or use of iterative reconstruction technique. COMPARISON:  None Available. FINDINGS: Alignment: No subluxation Skull base and vertebrae: No acute fracture. No primary bone lesion or focal pathologic process. Soft tissues and spinal canal: No prevertebral fluid or swelling. No visible canal hematoma. Disc levels:  Diffuse degenerative disc disease and facet disease. Upper chest: No acute findings Other: None IMPRESSION: No acute bony abnormality. Electronically Signed   By: Rolm Baptise M.D.   On: 09/26/2021 02:46   CT Head Wo Contrast  Result Date: 09/26/2021 CLINICAL DATA:  Head trauma, minor (Age >= 65y).  Unwitnessed fall EXAM: CT HEAD WITHOUT CONTRAST TECHNIQUE: Contiguous axial images were obtained from the base of the skull through the vertex without  intravenous contrast. RADIATION DOSE REDUCTION: This exam was performed according to the departmental dose-optimization program which includes automated exposure control, adjustment of the mA and/or kV according to patient size and/or use of iterative reconstruction technique. 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 No new hemorrhage, hydrocephalus or acute infarction. No mass effect or midline shift. Mild atrophy. Vascular: No hyperdense vessel or unexpected calcification. Skull: No acute calvarial abnormality. Sinuses/Orbits: No acute findings Other: None IMPRESSION: Small bilateral chronic subdural hematomas, stable or slightly smaller since prior study. No acute intracranial abnormality. Electronically Signed   By: Rolm Baptise M.D.   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 Likely decompensated secondary to recent surgery, pain medication Continue supportive care, limit Tylenol use.   Norco has been switched to oxycodone. CT images and MRI images were reviewed.  No obstruction. GI recommendation reviewed.  No intervention available Normal PT and PTT. Reviewed continue supportive care.   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  She previously tolerated well and amyloidosis: seems to respond to the treatment well.  However liver function did not dramatically improve secondary to the reversible amyloidosis changes of the liver. Off chemotherapy due to acute liver failure.  #Microcytic anemia, hemoglobin dropped to 8.8.  Patient has a history of iron deficiency anemia Thrombocytosis likely secondary to IDA.  Start iron supplementation  Her prognosis is poor.  Palliative consultation. Discussed with Altha Harm, patient and daughter prefer to  continue supportive care and follow-up outpatient.  Dr. Rogue Bussing is on-call over the weekend. Thank you for allowing me to participate in the care of this patient.   [name], MD, PhD Hematology Oncology 10/20/2021

## 2021-10-20 NOTE — TOC Initial Note (Addendum)
Transition of Care [facility]) - Initial/Assessment Note    Patient Details  Name: Nancy Rodriguez MRN: [id number] Date of Birth: 03-18-1946  Transition of Care [facility]) CM/SW Contact:    Magnus Ivan, LCSW Phone Number: 10/20/2021, 9:26 AM  Clinical Narrative:                 Patient is from [facility] where she was for short term rehab. Spoke to patient who says she is not sure if her plan is to return to [facility] at [place]. She asks that I speak to her children about it.  Left VMs for both daughters [name] and [name] requesting return call to confirm if plan is to return to [facility] at [place]. Spoke to Tammy in Admissions at [facility] as well who stated patient can return if that is what they desire, but will need new PT eval, FL2, and insurance authorization. Updated care team. Asked for PT eval.   10:01- Spoke to son [name] via phone. He stated they do want patient to return to [facility] for more short term rehab. Awaiting PT eval then will need FL2 and insurance auth.  [name] requested updates when available.  11:00- Call from daughter [name]. She states she not sure if the plan is for return to [facility] and needs to talk to [place]. She reported she will call CSW back with any updates.   11:07- Return call from [name] who states they may want to take patient home, they are going to think about it. Explained family would need to provide care at home. [name] states she lives with patient and could do this. [name] states she will talk to her siblings and keep CSW updated.   1:37- Spoke to [name] who states they are still trying to decide if they want SNF or home. She said she is going to have [employer] CSW. CSW explained per rounds patient may be medically ready over the weekend so we do need to establish a plan.  Inquired who the main contact will be for TOC. [name] stated [name] is the main contact and she will have him call CSW with what they want to do.   2:52- Called son [name]. He stated the plan has not changed. They still  want patient to go back to [facility] for more STR.  PT has recommended SNF. CSW is sending info to [facility] and starting insurance auth.  [name] confirmed he is the main family contact person.  Tammy at [facility] confirmed she can take patient either tomorrow 7/8 or Monday 7/10 pending auth.   Expected Discharge Plan: Skilled Nursing Facility Barriers to Discharge: Continued Medical Work up   Patient Goals and CMS Choice   CMS Medicare.gov Compare Post Acute Care list provided to:: Patient Represenative (must comment) Choice offered to / list presented to : Adult Children  Expected Discharge Plan and Services Expected Discharge Plan: [place]                                              Prior Living Arrangements/Services     Patient language and need for interpreter reviewed:: Yes        Need for Family Participation in Patient Care: Yes (Comment) Care giver support system in place?: Yes (comment)   Criminal Activity/Legal Involvement Pertinent to Current Situation/Hospitalization: No - Comment as needed  Activities of Daily  Living Home Assistive Devices/Equipment: Cane (specify quad or straight) ADL Screening (condition at time of admission) Patient's cognitive ability adequate to safely complete daily activities?: Yes Is the patient deaf or have difficulty hearing?: Yes Does the patient have difficulty seeing, even when wearing glasses/contacts?: No Does the patient have difficulty concentrating, remembering, or making decisions?: No Patient able to express need for assistance with ADLs?: Yes Does the patient have difficulty dressing or bathing?: Yes Independently performs ADLs?: No Communication: Independent Dressing (OT): Needs assistance Is this a change from baseline?: Pre-admission baseline Grooming: Needs assistance Is this a change from baseline?: Pre-admission baseline Feeding: Independent Bathing: Needs assistance Is this a change from baseline?:  Pre-admission baseline Toileting: Needs assistance Is this a change from baseline?: Pre-admission baseline In/Out Bed: Needs assistance Is this a change from baseline?: Pre-admission baseline Walks in Home: Needs assistance Is this a change from baseline?: Pre-admission baseline Does the patient have difficulty walking or climbing stairs?: Yes Weakness of Legs: Right Weakness of Arms/Hands: None  Permission Sought/Granted                  Emotional Assessment         Alcohol / Substance Use: Not Applicable Psych Involvement: No (comment)  Admission diagnosis:  Elevated bilirubin [R17] Patient Active Problem List   Diagnosis Date Noted   Elevated bilirubin 10/19/2021   Malnutrition of moderate degree 09/28/2021   AKI (acute kidney injury) ([place]) 09/28/2021   Acute postoperative anemia due to expected blood loss 09/27/2021   Delirium due to multiple etiologies, persistent, hypoactive 09/27/2021   Hypertensive urgency 09/26/2021   Diabetes mellitus without complication (HCC)    Chronic kidney disease, stage 3a ([place])    Fracture of femoral shaft, right, closed ([place])    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   Subdural hematoma ([place]) 05/18/2021   Hypoalbuminemia 04/27/2021   Vaginal bleeding 04/27/2021   Hepatic cirrhosis ([place]) 04/27/2021   Hyperbilirubinemia 04/27/2021   IDA (iron deficiency anemia) 02/16/2021   Gastroesophageal reflux disease 11/10/2020   Lower extremity edema 10/14/2020   Elevated LFTs 10/12/2020   Loss of weight 10/12/2020   Anasarca 09/04/2020   Edema 08/13/2020   Multiple myeloma ([place]) 08/13/2020   Chemotherapy induced nausea and vomiting 07/27/2020   Hypokalemia 07/27/2020   Goals of care, counseling/discussion 07/20/2020   Microcytic anemia 07/06/2020   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 [id number]: [phone]: [phone] [id number] [place] [address] [facility] [place]Phone: [phone]: [phone] [id number] [place] [address]: [phone]: [phone]   Social Determinants of Health (SDOH) Interventions    Readmission Risk Interventions     No data to display

## 2021-10-20 NOTE — Consult Note (Signed)
[place] at [facility] [phone] [phone]   Name: Nancy Rodriguez Date: 10/20/2021 MRN: [id number]  DOB: 05/16/1945  Patient Care Team: Leonel Ramsay, MD as PCP - General (Infectious Diseases) End, [name], MD as PCP - Cardiology (Cardiology) [name], MD as Consulting Physician (Oncology)    REASON FOR CONSULTATION: Nancy Rodriguez is a 76 y.o. female with multiple medical problems including monoclonal gammopathy, CKD stage 3b, anemia, RA, diabetes, cirrhosis, and AL amyloidosis.  Patient declined bone marrow transplant and was started on Dara CyBorD and then later rotated to [facility] RD chemotherapy.  Patient was hospitalized 09/26/2021 to 10/03/2021 with a right hip fracture status post surgical repair.  She was readmitted 10/19/2021 with rising bilirubin thought secondary to either amyloidosis or treatment.  Palliative care was consulted up address goals.  SOCIAL HISTORY:     reports that she has never smoked. She has never used smokeless tobacco. She reports that she does not drink alcohol and does not use drugs.  Patient is widowed.  She currently lives at home with her daughter.  In total, patient has 4 sons and 2 daughters.  Patient previously lived in [place] and was an Mining engineer at [employer].  ADVANCE DIRECTIVES:  None on file  CODE STATUS: Full code  PAST MEDICAL HISTORY: Past Medical History:  Diagnosis Date   (HFpEF) heart failure with preserved ejection fraction ([place])    a. 05/2020 Echo: EF 60-65%; b. 08/2020 cMRI ([facility]): EF 71%, no delayed hyperenhancement to suggest scar/infiltrative dzs. Nl RV fxn. Mild BAE. Triv MR, mild TR; c. 06/2021 Echo: EF 50-55%, no rwma, mild basal-septal LVH, GrII DD. Nl RV size/fxn. Mild MR. Ao sclerosis.   AL amyloidosis ([place])    Anemia due to chronic kidney disease    Anemia in chronic kidney disease   CKD (chronic kidney disease), stage III (HCC)     Diabetes mellitus without complication ([place])    History of cardiovascular stress test    a. 03/2012 Stress Echo([facility]): nl study.   Hypercholesterolemia    Hypertension    MGUS (monoclonal gammopathy of unknown significance)    Osteoarthritis    Rheumatoid arthritis ([place])     PAST SURGICAL HISTORY:  Past Surgical History:  Procedure Laterality Date   HERNIA REPAIR     INTRAMEDULLARY (IM) NAIL INTERTROCHANTERIC Right 09/26/2021   Procedure: INTRAMEDULLARY (IM) NAIL INTERTROCHANTRIC;  Surgeon: Thornton Park, MD;  Location: [facility] ORS;  Service: Orthopedics;  Laterality: Right;   PARATHYROIDECTOMY      HEMATOLOGY/ONCOLOGY HISTORY:  Oncology History   No history exists.    ALLERGIES:  is allergic to benazepril, lisinopril, tolmetin, and nsaids.  MEDICATIONS:  Current Facility-Administered Medications  Medication Dose Route Frequency Provider Last Rate Last Admin   amLODipine (NORVASC) tablet 5 mg  5 mg Oral Daily Judd Gaudier V, MD   5 mg at 10/20/21 0901   hydrALAZINE (APRESOLINE) injection 5 mg  5 mg Intravenous Q4H PRN Athena Masse, MD       insulin aspart (novoLOG) injection 0-9 Units  0-9 Units Subcutaneous TID AC & HS Jennye Boroughs, MD   1 Units at 10/20/21 1216   metoprolol succinate (TOPROL-XL) 24 hr tablet 50 mg  50 mg Oral Daily Judd Gaudier V, MD   50 mg at 10/20/21 0901   morphine (PF) 2 MG/ML injection 2 mg  2 mg Intravenous Q2H PRN Athena Masse, MD  ondansetron (ZOFRAN) tablet 4 mg  4 mg Oral Q6H PRN Athena Masse, MD       Or   ondansetron [facility]) injection 4 mg  4 mg Intravenous Q6H PRN Athena Masse, MD       oxyCODONE (Oxy IR/ROXICODONE) immediate release tablet 5 mg  5 mg Oral Q6H PRN [name], MD       sertraline (ZOLOFT) tablet 50 mg  50 mg Oral Daily Judd Gaudier V, MD   50 mg at 10/20/21 0901    VITAL SIGNS: BP (!) 143/79 (BP Location: Right Arm)   Pulse 90   Temp 98.4 F (36.9 C) (Oral)   Resp 19   SpO2 99%  There were no vitals  filed for this visit.  Estimated body mass index is 21.27 kg/m as calculated from the following:   Height as of 09/26/21: 5' 3.5" (1.613 m).   Weight as of 09/26/21: 122 lb (55.3 kg).  LABS: CBC:    Component Value Date/Time   WBC 6.6 10/20/2021 0429   HGB 8.8 (L) 10/20/2021 0429   HCT 25.0 (L) 10/20/2021 0429   PLT 487 (H) 10/20/2021 0429   MCV 77.9 (L) 10/20/2021 0429   NEUTROABS 13.6 (H) 09/26/2021 0412   LYMPHSABS 0.7 09/26/2021 0412   MONOABS 0.6 09/26/2021 0412   EOSABS 0.0 09/26/2021 0412   BASOSABS 0.0 09/26/2021 0412   Comprehensive Metabolic Panel:    Component Value Date/Time   NA 139 10/20/2021 0429   K 3.5 10/20/2021 0429   CL 113 (H) 10/20/2021 0429   CO2 21 (L) 10/20/2021 0429   BUN 23 10/20/2021 0429   CREATININE 0.89 10/20/2021 0429   GLUCOSE 115 (H) 10/20/2021 0429   CALCIUM 8.5 (L) 10/20/2021 0429   AST 44 (H) 10/20/2021 0429   ALT 38 10/20/2021 0429   ALKPHOS 708 (H) 10/20/2021 0429   BILITOT 10.7 (H) 10/20/2021 0429   PROT 4.7 (L) 10/20/2021 0429   ALBUMIN 2.1 (L) 10/20/2021 0429    RADIOGRAPHIC STUDIES: MR ABDOMEN MRCP W WO CONTAST  Result Date: 10/19/2021 CLINICAL DATA:  Elevated bilirubin levels.  History of cirrhosis EXAM: MRI ABDOMEN WITHOUT AND WITH CONTRAST (INCLUDING MRCP) TECHNIQUE: Multiplanar multisequence MR imaging of the abdomen was performed both before and after the administration of intravenous contrast. Heavily T2-weighted images of the biliary and pancreatic ducts were obtained, and three-dimensional MRCP images were rendered by post processing. CONTRAST:  56mL GADAVIST GADOBUTROL 1 MMOL/ML IV SOLN COMPARISON:  CT AP 10/18/2021 FINDINGS: Lower chest: No acute findings. Hepatobiliary: The liver has a cirrhotic appearance with contour nodularity and hypertrophy of the caudate lobe and lateral segment of left hepatic lobe. Left lobe of liver cyst containing thin internal area of eccentric septation is noted measuring 1.3 cm. Heterogeneous  enhancement is identified anterior right hepatic lobe. No definite focal enhancing liver lesions identified. There are multiple stones identified within the dependent portion of the gallbladder measuring up to 3 mm. Mild gallbladder wall thickening measures up to 3 mm which is nonspecific in the setting of cirrhosis and portal venous hypertension. There is no intrahepatic bile duct dilatation. The CBD is upper limits of normal in caliber measuring 6 mm. No choledocholithiasis identified Pancreas: No main duct dilatation or inflammation identified. Multiple cystic lesions are identified within the uncinate process, head and tail of pancreas. -Bilobed cyst containing a internal area of hairline septation measures 2.4 x 1.5 cm within the uncinate process, image 28/4. -Within the tail of pancreas there is  a bilobed cystic structure containing a thin internal area of septation measuring 1.8 cm, image 25/4. -Unilocular cyst within the anterior head of pancreas measures 7 mm, image 27/4. Spleen:  Within normal limits in size and appearance. Adrenals/Urinary Tract: Normal adrenal glands. No suspicious mass or hydronephrosis identified. Several small simple appearing cysts are noted. No follow-up imaging recommended. Stomach/Bowel: Visualized portions within the abdomen are unremarkable. Vascular/Lymphatic: Aortic atherosclerosis without aneurysm. No adenopathy. Other: There is a moderate volume of ascites identified within the upper abdomen. Musculoskeletal: No suspicious bone lesions identified. IMPRESSION: 1. Morphologic features of the liver compatible with cirrhosis. 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 2. Gallstones. Mild gallbladder wall thickening is nonspecific in the setting of cirrhosis and portal venous hypertension. 3. No signs of biliary ductal dilatation.  No choledocholithiasis. 4. Multiple cystic lesions are identified within the uncinate process, head and tail of pancreas. The  largest measures 2.4 cm. Follow-up imaging with repeat MRI in 6 months is recommended. This recommendation follows ACR consensus guidelines: Management of Incidental Pancreatic Cysts: A White Paper of the ACR Incidental Findings Committee. J Am Coll Radiol 5009;38:182-993. 5.  Aortic Atherosclerosis (ICD10-I70.0). Electronically Signed   By: Kerby Moors M.D.   On: 10/19/2021 05:18   CT Abdomen Pelvis W Contrast  Result Date: 10/18/2021 CLINICAL DATA:  Elevated ammonia level and bilirubin level with painless jaundice, initial encounter EXAM: CT ABDOMEN AND PELVIS WITH CONTRAST TECHNIQUE: Multidetector CT imaging of the abdomen and pelvis was performed using the standard protocol following bolus administration of intravenous contrast. RADIATION DOSE REDUCTION: This exam was performed according to the departmental dose-optimization program which includes automated exposure control, adjustment of the mA and/or kV according to patient size and/or use of iterative reconstruction technique. CONTRAST:  59mL OMNIPAQUE IOHEXOL 300 MG/ML  SOLN COMPARISON:  MRI from 04/06/2021, ultrasound from 06/30/2021 FINDINGS: Lower chest: Lung bases are free of acute infiltrate or sizable effusion. Hepatobiliary: Gallbladder is well distended with multiple dependent gallstones. Mottled decreased attenuation is identified throughout the right lobe of the liver without discrete mass. These changes are felt to be related to progressive cirrhotic change. Cyst is again noted in the left lobe of the liver stable in appearance from the prior exam. Pancreas: Pancreas again demonstrates cystic lesions within the head and body of the pancreas stable in appearance from the prior exam. Spleen: Normal in size without focal abnormality. Adrenals/Urinary Tract: Adrenal glands are within normal limits. Kidneys demonstrate a normal enhancement pattern bilaterally. No renal calculi or obstructive changes are noted. Bladder is well distended.  Stomach/Bowel: The appendix is within normal limits. No obstructive or inflammatory changes colon are seen. Small bowel and stomach are within limits. Vascular/Lymphatic: Aortic atherosclerosis. No enlarged abdominal or pelvic lymph nodes. Reproductive: Uterus and bilateral adnexa are unremarkable. Other: Moderate ascites is noted consistent with the underlying cirrhotic change. Musculoskeletal: Degenerative changes of the lumbar spine are noted. Right hip fixation is noted. Progressive L4 compression deformity is noted but appears chronic. IMPRESSION: Mottled enhancement within the right lobe of the liver likely related to the patient's known underlying cirrhosis. This may represent some regenerating nodules. Nonemergent MRI when the patient can tolerate adequate breath hold technique may be helpful. Cholelithiasis without complicating factors. Moderate ascites related to cirrhotic change. Cystic lesions within the pancreas stable from prior exam. Electronically Signed   By: Inez Catalina M.D.   On: 10/18/2021 22:02   DG Wrist 2 Views Left  Result Date: 09/27/2021 CLINICAL DATA:  Left  wrist pain after fall yesterday. EXAM: LEFT WRIST - 2 VIEW COMPARISON:  None available FINDINGS: There is 1 mm ulnar negative variance. Moderate to severe thumb carpometacarpal and moderate triscaphe joint space narrowing and peripheral osteophytosis. Mild-to-moderate second carpometacarpal joint space narrowing and peripheral osteophytosis. Moderate distal radial styloid degenerative spur contacts a degenerative spur at the distal lateral aspect of the scaphoid. Bone overlap in different regions of the scaphoid on frontal and lateral views limits evaluation for an acute fracture. This includes within the region of the distal lateral aspect of the scaphoid where a prominent degenerative spur overlies the distal radial styloid. There is decreased scaphoid bone mineralization in this region with multiple lucent likely bone nutrient  foramina. No definite acute fracture is visualized. IMPRESSION: 1. 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 2. Moderate degenerative changes of the distal radial styloid and adjacent distal lateral aspect of the scaphoid. 3. No definite acute fracture is seen. If there is there is snuffbox tenderness and clinical concern for a radiographically occult fracture, consider splinting and follow-up radiographs in 10 days. Electronically Signed   By: Yvonne Kendall M.D.   On: 09/27/2021 11:00   DG FEMUR, MIN 2 VIEWS RIGHT  Result Date: 09/26/2021 CLINICAL DATA:  Status post right IM nail fixation EXAM: RIGHT FEMUR 2 VIEWS; PORTABLE PELVIS 1-2 VIEWS COMPARISON:  09/26/2021 intraoperative images and hip radiographs FINDINGS: Status post intramedullary nail fixation of a previously noted proximal right femoral diaphyseal fracture with improved alignment of the fracture components. No perihardware lucency or additional acute fracture in the pelvis or right femur. Air within the soft tissues, not unexpected post surgically. Superficial skin staples. IMPRESSION: Expected postoperative appearance, status post intramedullary nail fixation of a right femoral fracture. Electronically Signed   By: Merilyn Baba M.D.   On: 09/26/2021 17:38   Pelvis Portable  Result Date: 09/26/2021 CLINICAL DATA:  Status post right IM nail fixation EXAM: RIGHT FEMUR 2 VIEWS; PORTABLE PELVIS 1-2 VIEWS COMPARISON:  09/26/2021 intraoperative images and hip radiographs FINDINGS: Status post intramedullary nail fixation of a previously noted proximal right femoral diaphyseal fracture with improved alignment of the fracture components. No perihardware lucency or additional acute fracture in the pelvis or right femur. Air within the soft tissues, not unexpected post surgically. Superficial skin staples. IMPRESSION: Expected postoperative appearance, status post intramedullary nail fixation of a right femoral  fracture. Electronically Signed   By: Merilyn Baba M.D.   On: 09/26/2021 17:38   DG FEMUR, MIN 2 VIEWS RIGHT  Result Date: 09/26/2021 CLINICAL DATA:  Right proximal femoral ORIF EXAM: RIGHT FEMUR 2 VIEWS COMPARISON:  Pelvis and right hip radiographs 09/26/2021 FINDINGS: Images were performed intraoperatively without the presence of a radiologist. Redemonstration of oblique proximal right femoral diaphyseal fracture. New long-stem cephalo medullary nail fixation of this fracture with improved now anatomic alignment. No complication visualized. Total fluoroscopy images: 6 Total fluoroscopy time: 183 seconds Please see intraoperative findings for further detail. IMPRESSION: Intraoperative fluoroscopy for proximal right femoral ORIF. Electronically Signed   By: Yvonne Kendall M.D.   On: 09/26/2021 17:04   DG C-Arm 1-60 Min-No Report  Result Date: 09/26/2021 Fluoroscopy was utilized by the requesting physician.  No radiographic interpretation.   DG C-Arm 1-60 Min-No Report  Result Date: 09/26/2021 Fluoroscopy was utilized by the requesting physician.  No radiographic interpretation.   DG C-Arm 1-60 Min-No Report  Result Date: 09/26/2021 Fluoroscopy was utilized by the requesting physician.  No radiographic interpretation.   DG Chest  Portable 1 View  Result Date: 09/26/2021 CLINICAL DATA:  Fall, preop EXAM: PORTABLE CHEST 1 VIEW COMPARISON:  None Available. FINDINGS: Elevation of the right hemidiaphragm. Heart and mediastinal contours are within normal limits. No focal opacities or effusions. No acute bony abnormality. IMPRESSION: No active disease. Electronically Signed   By: Rolm Baptise M.D.   On: 09/26/2021 03:11   DG Hip Unilat W or Wo Pelvis 2-3 Views Right  Result Date: 09/26/2021 CLINICAL DATA:  Fall EXAM: DG HIP (WITH OR WITHOUT PELVIS) 2-3V RIGHT COMPARISON:  None Available. FINDINGS: There is a fracture through the proximal shaft of the right femur. Femoral shaft is displaced medially  relative to the femoral neck. No subluxation or dislocation at the hip. IMPRESSION: Displaced right proximal femoral shaft fracture. Electronically Signed   By: Rolm Baptise M.D.   On: 09/26/2021 03:07   CT Cervical Spine Wo Contrast  Result Date: 09/26/2021 CLINICAL DATA:  Facial trauma, blunt.  Unwitnessed fall. EXAM: CT CERVICAL SPINE WITHOUT CONTRAST TECHNIQUE: Multidetector CT imaging of the cervical spine was performed without intravenous contrast. Multiplanar CT image reconstructions were also generated. RADIATION DOSE REDUCTION: This exam was performed according to the departmental dose-optimization program which includes automated exposure control, adjustment of the mA and/or kV according to patient size and/or use of iterative reconstruction technique. COMPARISON:  None Available. FINDINGS: Alignment: No subluxation Skull base and vertebrae: No acute fracture. No primary bone lesion or focal pathologic process. Soft tissues and spinal canal: No prevertebral fluid or swelling. No visible canal hematoma. Disc levels:  Diffuse degenerative disc disease and facet disease. Upper chest: No acute findings Other: None IMPRESSION: No acute bony abnormality. Electronically Signed   By: Rolm Baptise M.D.   On: 09/26/2021 02:46   CT Head Wo Contrast  Result Date: 09/26/2021 CLINICAL DATA:  Head trauma, minor (Age >= 65y).  Unwitnessed fall EXAM: CT HEAD WITHOUT CONTRAST TECHNIQUE: Contiguous axial images were obtained from the base of the skull through the vertex without intravenous contrast. RADIATION DOSE REDUCTION: This exam was performed according to the departmental dose-optimization program which includes automated exposure control, adjustment of the mA and/or kV according to patient size and/or use of iterative reconstruction technique. 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 No new hemorrhage, hydrocephalus or acute infarction. No mass effect or midline shift. Mild atrophy. Vascular: No hyperdense vessel or unexpected calcification. Skull: No acute calvarial abnormality. Sinuses/Orbits: No acute findings Other: None IMPRESSION: Small bilateral chronic subdural hematomas, stable or slightly smaller since prior study. No acute intracranial abnormality. Electronically Signed   By: Rolm Baptise M.D.   On: 09/26/2021 02:45    PERFORMANCE STATUS (ECOG) : 3 - Symptomatic, >50% confined to bed  Review of Systems Unless otherwise noted, a complete review of systems is negative.  Physical Exam General: NAD Cardiovascular: regular rate and rhythm Pulmonary: clear ant fields Abdomen: soft, nontender, + bowel sounds GU: no suprapubic tenderness Extremities: no edema, no joint deformities Skin: no rashes Neurological: Weakness but otherwise nonfocal  IMPRESSION: I met with patient and then at her request, I met privately with patient's daughter, [name].  Had a long discussion with [name] regarding patient's overall decline.  Recommendation is for best supportive care at present given rising total bilirubin.  GI has consulted and thinks that in the absence of obstruction, likely etiology is secondary to medications.  Daughter recognizes that this could  affect future treatments and we discussed the possible option of hospice in the setting of further decline.  Daughter plans to speak with family and patient regarding disposition goals.  Likely, family will opt to send patient back to rehab in an attempt to improve her performance status prior to returning home.  I would recommend palliative care following her at SNF to continue goals of care conversations.  We discussed CODE STATUS.  Daughter plans to speak with patient and family about these decisions.  PLAN: -Continue current scope of treatment -Probable dispo: Rehab with palliative care following -Will  follow  Case and plan discussed with Dr. Tasia Catchings    Time Total: 70 minutes  Visit consisted of counseling and education dealing with the complex and emotionally intense issues of symptom management and palliative care in the setting of serious and potentially life-threatening illness.Greater than 50%  of this time was spent counseling and coordinating care related to the above assessment and plan.  Signed by: Altha Harm, PhD, NP-C

## 2021-10-20 NOTE — Progress Notes (Signed)
GI Inpatient Follow-up Note  Subjective:  Patient seen and is stable. T. Bili similar.  Scheduled Inpatient Medications:   amLODipine  5 mg Oral Daily   insulin aspart  0-9 Units Subcutaneous TID AC & HS   metoprolol succinate  50 mg Oral Daily   sertraline  50 mg Oral Daily    Continuous Inpatient Infusions:    PRN Inpatient Medications:  hydrALAZINE, morphine injection, ondansetron **OR** ondansetron (ZOFRAN) IV, oxyCODONE  Review of Systems:  Review of Systems  Constitutional:  Positive for malaise/fatigue. Negative for fever.  Respiratory:  Negative for shortness of breath.   Cardiovascular:  Negative for chest pain.  Gastrointestinal:  Negative for blood in stool and diarrhea.  Musculoskeletal:  Positive for joint pain.  Neurological:  Positive for weakness.  Psychiatric/Behavioral:  Negative for substance abuse.   All other systems reviewed and are negative.     Physical Examination: BP 122/67 (BP Location: Left Arm)   Pulse 72   Temp 98.4 F (36.9 C) (Oral)   Resp 20   SpO2 100%  Gen: NAD, alert and oriented x 4 HEENT: Scleral icterus Neck: supple, no JVD or thyromegaly Chest: No respiratory distress Abd: soft, slightly distended Ext: no edema, well perfused with 2+ pulses, Skin: no rash or lesions noted Lymph: no LAD  Data: Lab Results  Component Value Date   WBC 6.6 10/20/2021   HGB 8.8 (L) 10/20/2021   HCT 25.0 (L) 10/20/2021   MCV 77.9 (L) 10/20/2021   PLT 487 (H) 10/20/2021   Recent Labs  Lab 10/18/21 1606 10/20/21 0429  HGB 10.2* 8.8*   Lab Results  Component Value Date   NA 139 10/20/2021   K 3.5 10/20/2021   CL 113 (H) 10/20/2021   CO2 21 (L) 10/20/2021   BUN 23 10/20/2021   CREATININE 0.89 10/20/2021   Lab Results  Component Value Date   ALT 38 10/20/2021   AST 44 (H) 10/20/2021   ALKPHOS 708 (H) 10/20/2021   BILITOT 10.7 (H) 10/20/2021   Recent Labs  Lab 10/19/21 1535  APTT 36  INR 1.2   Assessment/Plan: Ms.  Rodriguez is a 76 y.o. lady with amyloid of liver/kidneys here with failure to thrive and hyperbilirubinemia. Differential includes medication side effect vs intrahepatic cholestasis.  Recommendations:  - would start IVF while in house - again, this could be intrahepatic cholestasis from here amyloid vs medication induced, regardless there's nothing that can be done for either situation - supportive care - dispo planning  Will follow peripherally, please call if any questions or concerns. Dr. Virgina Jock will be covering over the weekend.  Raylene Miyamoto MD, MPH [place]

## 2021-10-20 NOTE — Progress Notes (Signed)
Mobility Specialist - Progress Note    10/20/21 0830  Mobility  Activity Transferred from bed to chair;Stood at bedside  Level of Assistance Standby assist, set-up cues, supervision of patient - no hands on  Assistive Device Front wheel walker  Distance Ambulated (ft) 4 ft  Activity Response Tolerated well  $Mobility charge 1 Mobility     Pt semi supine upon arrival using RA. Completes bed mobility MinA (HHA) and log roll due to pain from right broken hip. Pt completes STS CGA and transfers B-C SBA. VC for hand placement before descending to recliner. Pt is left in chair with alarm on, needs in reach, RN notified.  Merrily Brittle Mobility Specialist 10/20/21, 8:33 AM

## 2021-10-21 DIAGNOSIS — E1122 Type 2 diabetes mellitus with diabetic chronic kidney disease: Secondary | ICD-10-CM | POA: Diagnosis present

## 2021-10-21 DIAGNOSIS — Z833 Family history of diabetes mellitus: Secondary | ICD-10-CM | POA: Diagnosis not present

## 2021-10-21 DIAGNOSIS — I10 Essential (primary) hypertension: Secondary | ICD-10-CM | POA: Diagnosis not present

## 2021-10-21 DIAGNOSIS — Z808 Family history of malignant neoplasm of other organs or systems: Secondary | ICD-10-CM | POA: Diagnosis not present

## 2021-10-21 DIAGNOSIS — W19XXXD Unspecified fall, subsequent encounter: Secondary | ICD-10-CM | POA: Diagnosis present

## 2021-10-21 DIAGNOSIS — E8581 Light chain (AL) amyloidosis: Secondary | ICD-10-CM | POA: Diagnosis present

## 2021-10-21 DIAGNOSIS — D472 Monoclonal gammopathy: Secondary | ICD-10-CM | POA: Diagnosis present

## 2021-10-21 DIAGNOSIS — E7800 Pure hypercholesterolemia, unspecified: Secondary | ICD-10-CM | POA: Diagnosis present

## 2021-10-21 DIAGNOSIS — I5032 Chronic diastolic (congestive) heart failure: Secondary | ICD-10-CM | POA: Diagnosis present

## 2021-10-21 DIAGNOSIS — Z7901 Long term (current) use of anticoagulants: Secondary | ICD-10-CM | POA: Diagnosis not present

## 2021-10-21 DIAGNOSIS — K766 Portal hypertension: Secondary | ICD-10-CM | POA: Diagnosis present

## 2021-10-21 DIAGNOSIS — K7210 Chronic hepatic failure without coma: Secondary | ICD-10-CM | POA: Diagnosis present

## 2021-10-21 DIAGNOSIS — Z886 Allergy status to analgesic agent status: Secondary | ICD-10-CM | POA: Diagnosis not present

## 2021-10-21 DIAGNOSIS — Z800 Family history of malignant neoplasm of digestive organs: Secondary | ICD-10-CM | POA: Diagnosis not present

## 2021-10-21 DIAGNOSIS — R188 Other ascites: Secondary | ICD-10-CM | POA: Diagnosis present

## 2021-10-21 DIAGNOSIS — Z515 Encounter for palliative care: Secondary | ICD-10-CM | POA: Diagnosis not present

## 2021-10-21 DIAGNOSIS — D631 Anemia in chronic kidney disease: Secondary | ICD-10-CM | POA: Diagnosis present

## 2021-10-21 DIAGNOSIS — K8020 Calculus of gallbladder without cholecystitis without obstruction: Secondary | ICD-10-CM | POA: Diagnosis present

## 2021-10-21 DIAGNOSIS — Z888 Allergy status to other drugs, medicaments and biological substances status: Secondary | ICD-10-CM | POA: Diagnosis not present

## 2021-10-21 DIAGNOSIS — K77 Liver disorders in diseases classified elsewhere: Secondary | ICD-10-CM | POA: Diagnosis not present

## 2021-10-21 DIAGNOSIS — N1831 Chronic kidney disease, stage 3a: Secondary | ICD-10-CM | POA: Diagnosis present

## 2021-10-21 DIAGNOSIS — E440 Moderate protein-calorie malnutrition: Secondary | ICD-10-CM | POA: Diagnosis present

## 2021-10-21 DIAGNOSIS — I130 Hypertensive heart and chronic kidney disease with heart failure and stage 1 through stage 4 chronic kidney disease, or unspecified chronic kidney disease: Secondary | ICD-10-CM | POA: Diagnosis present

## 2021-10-21 DIAGNOSIS — M069 Rheumatoid arthritis, unspecified: Secondary | ICD-10-CM | POA: Diagnosis present

## 2021-10-21 DIAGNOSIS — Z79899 Other long term (current) drug therapy: Secondary | ICD-10-CM | POA: Diagnosis not present

## 2021-10-21 DIAGNOSIS — K862 Cyst of pancreas: Secondary | ICD-10-CM | POA: Diagnosis present

## 2021-10-21 DIAGNOSIS — Z66 Do not resuscitate: Secondary | ICD-10-CM | POA: Diagnosis not present

## 2021-10-21 DIAGNOSIS — R17 Unspecified jaundice: Secondary | ICD-10-CM | POA: Diagnosis present

## 2021-10-21 DIAGNOSIS — E854 Organ-limited amyloidosis: Secondary | ICD-10-CM | POA: Diagnosis not present

## 2021-10-21 LAB — COMPREHENSIVE METABOLIC PANEL
ALT: 41 U/L (ref 0–44)
AST: 52 U/L — ABNORMAL HIGH (ref 15–41)
Albumin: 2.2 g/dL — ABNORMAL LOW (ref 3.5–5.0)
Alkaline Phosphatase: 735 U/L — ABNORMAL HIGH (ref 38–126)
Anion gap: 5 (ref 5–15)
BUN: 19 mg/dL (ref 8–23)
CO2: 19 mmol/L — ABNORMAL LOW (ref 22–32)
Calcium: 8.4 mg/dL — ABNORMAL LOW (ref 8.9–10.3)
Chloride: 111 mmol/L (ref 98–111)
Creatinine, Ser: 0.75 mg/dL (ref 0.44–1.00)
GFR, Estimated: >60 mL/min (ref >60)
Glucose, Bld: 105 mg/dL — ABNORMAL HIGH (ref 70–99)
Potassium: 3.7 mmol/L (ref 3.5–5.1)
Sodium: 135 mmol/L (ref 135–145)
Total Bilirubin: 11.1 mg/dL — ABNORMAL HIGH (ref 0.3–1.2)
Total Protein: 4.9 g/dL — ABNORMAL LOW (ref 6.5–8.1)

## 2021-10-21 LAB — HEPATITIS PANEL, ACUTE
HCV Ab: NONREACTIVE
Hep A IgM: NONREACTIVE
Hep B C IgM: NONREACTIVE
Hepatitis B Surface Ag: NONREACTIVE

## 2021-10-21 LAB — GLUCOSE, CAPILLARY
Glucose-Capillary: 121 mg/dL — ABNORMAL HIGH (ref 70–99)
Glucose-Capillary: 169 mg/dL — ABNORMAL HIGH (ref 70–99)
Glucose-Capillary: 146 mg/dL — ABNORMAL HIGH (ref 70–99)
Glucose-Capillary: 129 mg/dL — ABNORMAL HIGH (ref 70–99)

## 2021-10-21 MED ORDER — LACTATED RINGERS IV SOLN: INTRAVENOUS | Status: DC

## 2021-10-21 NOTE — Progress Notes (Signed)
Progress Note    Nancy Rodriguez  EHM:[id number] DOB: [birth year]  DOA: 10/18/2021 PCP: Leonel Ramsay, MD      Brief Narrative:    Medical records reviewed and are as summarized below:  Nancy Rodriguez is a 76 y.o. female with medical history significant for light chain amyloidosis, amyloid liver disease who was referred to the emergency room for evaluation of increasing bilirubin level above her baseline.       Assessment/Plan:   Principal Problem:   Elevated bilirubin Active Problems:   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HCC)   Pancreatic cyst   Chronic kidney disease, stage 3a (HCC)   Malnutrition of moderate degree   Essential hypertension   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HCC)   Palliative care encounter    Hyperbilirubinemia, elevated liver enzymes No CBD stone or biliary tract dilatation noted on MRCP.  Hyperbilirubinemia suspected to be from Revlimid, Tylenol or amyloid liver disease. Bilirubin level is still high.  Start IV fluids per gastroenterologist's recommendation.    Latest Reference Range & Units Most Recent 10/21/21 03:56  Total Bilirubin 0.3 - 1.2 mg/dL 11.1 (H) 10/21/21 03:56 11.1 (H)  (H): Data is abnormally high    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[place]) She was on chemotherapy revlimid, dexamethasone and daratumumab in the outpatient setting   Pancreatic cyst Stable on CT   Chronic kidney disease, stage 3a ([place]) Renal function at baseline   Malnutrition of moderate degree Nutritional supplements   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HCC) Hemoglobin 10.2, slightly improved from baseline   Essential hypertension Continue home amlodipine and metoprolol   Generalized weakness PT recommends discharge to SNF.  Follow-up with social worker to assist with disposition.         Diet Order             Diet heart healthy/carb modified Room service appropriate? Yes; Fluid consistency: Thin  Diet effective now                           Consultants: Copywriter, advertising, oncologist  Procedures: None    Medications:    amLODipine  5 mg Oral Daily   ferrous sulfate  325 mg Oral BID WC   heparin injection (subcutaneous)  5,000 Units Subcutaneous Q8H   insulin aspart  0-9 Units Subcutaneous TID AC & HS   metoprolol succinate  50 mg Oral Daily   sertraline  50 mg Oral Daily   Continuous Infusions:  lactated ringers 75 mL/hr at 10/21/21 1256     Anti-infectives (From admission, onward)    None              Family Communication/Anticipated D/C date and plan/Code Status   DVT prophylaxis: heparin injection 5,000 Units Start: 10/20/21 2200 SCDs Start: 10/19/21 0111     Code Status: Full Code  Family Communication: None Disposition Plan: Plan to discharge to SNF   Status is: Observation The patient will require care spanning > 2 midnights and should be moved to inpatient because: Awaiting placement to SNF       Subjective:   No abdominal pain, vomiting or confusion.  No shortness of breath or chest pain.  She asked about discharge plans  Objective:    Vitals:   10/20/21 1608 10/20/21 1925 10/21/21 0551 10/21/21 0818  BP: 122/67 (!) 120/54 (!) 160/71 (!) 141/70  Pulse: 72 77 73 77  Resp: '20 18 16 16  '$ Temp:  98.7  F (37.1 C) 99 F (37.2 C) 98.4 F (36.9 C)  TempSrc:    Oral  SpO2: 100% 99% 100% 99%   No data found.   Intake/Output Summary (Last 24 hours) at 10/21/2021 1324 Last data filed at 10/20/2021 1932 Gross per 24 hour  Intake 240 ml  Output --  Net 240 ml   There were no vitals filed for this visit.  Exam:  GEN: NAD SKIN: Warm and dry EYES: Icteric ENT: MMM CV: RRR PULM: CTA B ABD: soft, midline abdominal hernia, NT, +BS CNS: AAO x 3, non focal EXT: Mild right pedal edema, no erythema or tenderness       Data Reviewed:   I have personally reviewed following labs and imaging studies:  Labs: Labs show the following:   Basic Metabolic  Panel: Recent Labs  Lab 10/18/21 1606 10/20/21 0429 10/21/21 0356  NA 139 139 135  K 4.0 3.5 3.7  CL 110 113* 111  CO2 19* 21* 19*  GLUCOSE 132* 115* 105*  BUN 30* 23 19  CREATININE 0.98 0.89 0.75  CALCIUM 8.8* 8.5* 8.4*   GFR CrCl cannot be calculated (Unknown ideal weight.). Liver Function Tests: Recent Labs  Lab 10/18/21 1606 10/19/21 0836 10/20/21 0429 10/21/21 0356  AST 47* 33 44* 52*  ALT 44 37 38 41  ALKPHOS 830* 721* 708* 735*  BILITOT 11.6* 10.5* 10.7* 11.1*  PROT 5.6* 5.2* 4.7* 4.9*  ALBUMIN 2.4* 2.3* 2.1* 2.2*   Recent Labs  Lab 10/18/21 1606  LIPASE 24   Recent Labs  Lab 10/18/21 2301  AMMONIA 36*   Coagulation profile Recent Labs  Lab 10/19/21 1535  INR 1.2    CBC: Recent Labs  Lab 10/18/21 1606 10/20/21 0429  WBC 7.2 6.6  HGB 10.2* 8.8*  HCT 29.3* 25.0*  MCV 81.4 77.9*  PLT 565* 487*   Cardiac Enzymes: No results for input(s): "CKTOTAL", "CKMB", "CKMBINDEX", "TROPONINI" in the last 168 hours. BNP (last 3 results) No results for input(s): "PROBNP" in the last 8760 hours. CBG: Recent Labs  Lab 10/20/21 1213 10/20/21 1609 10/20/21 1921 10/21/21 0822 10/21/21 1231  GLUCAP 147* 130* 190* 121* 169*   D-Dimer: No results for input(s): "DDIMER" in the last 72 hours. Hgb A1c: No results for input(s): "HGBA1C" in the last 72 hours. Lipid Profile: No results for input(s): "CHOL", "HDL", "LDLCALC", "TRIG", "CHOLHDL", "LDLDIRECT" in the last 72 hours. Thyroid function studies: No results for input(s): "TSH", "T4TOTAL", "T3FREE", "THYROIDAB" in the last 72 hours.  Invalid input(s): "FREET3" Anemia work up: No results for input(s): "VITAMINB12", "FOLATE", "FERRITIN", "TIBC", "IRON", "RETICCTPCT" in the last 72 hours. Sepsis Labs: Recent Labs  Lab 10/18/21 1606 10/20/21 0429  WBC 7.2 6.6    Microbiology No results found for this or any previous visit (from the past 240 hour(s)).  Procedures and diagnostic studies:  No  results found.             LOS: 0 days   Nathania Waldman  [facility]   Pager on [email or web address]. If 7PM-7AM, please contact night-coverage at [email or web address]     10/21/2021, 1:24 PM

## 2021-10-21 NOTE — TOC Progression Note (Addendum)
Transition of Care [facility]) - Progression Note    Patient Details  Name: Nancy Rodriguez MRN: [id number] Date of Birth: 05-May-1945  Transition of Care [name]) CM/SW [place], LCSW Phone Number: 10/21/2021, 9:45 AM  Clinical Narrative:    [name] and spoke to [name] as [name] was not showing up in portal. [name] reported [name] is still pending, [name] ID is [id number].  [name] fixed the auth so it will now show up in the portal as still pending.   11:55- Auth still pending.   Expected Discharge Plan: Skilled Nursing Facility Barriers to Discharge: Continued Medical Work up  Expected Discharge Plan and Services Expected Discharge Plan: [place]                                               Social Determinants of Health (SDOH) Interventions    Readmission Risk Interventions     No data to display

## 2021-10-22 DIAGNOSIS — E854 Organ-limited amyloidosis: Secondary | ICD-10-CM | POA: Diagnosis not present

## 2021-10-22 DIAGNOSIS — K77 Liver disorders in diseases classified elsewhere: Secondary | ICD-10-CM | POA: Diagnosis not present

## 2021-10-22 DIAGNOSIS — R17 Unspecified jaundice: Secondary | ICD-10-CM | POA: Diagnosis not present

## 2021-10-22 LAB — GLUCOSE, CAPILLARY
Glucose-Capillary: 119 mg/dL — ABNORMAL HIGH (ref 70–99)
Glucose-Capillary: 137 mg/dL — ABNORMAL HIGH (ref 70–99)
Glucose-Capillary: 138 mg/dL — ABNORMAL HIGH (ref 70–99)

## 2021-10-22 LAB — COMPREHENSIVE METABOLIC PANEL
ALT: 40 U/L (ref 0–44)
AST: 49 U/L — ABNORMAL HIGH (ref 15–41)
Albumin: 2.2 g/dL — ABNORMAL LOW (ref 3.5–5.0)
Alkaline Phosphatase: 707 U/L — ABNORMAL HIGH (ref 38–126)
Anion gap: 6 (ref 5–15)
BUN: 17 mg/dL (ref 8–23)
CO2: 23 mmol/L (ref 22–32)
Calcium: 8.4 mg/dL — ABNORMAL LOW (ref 8.9–10.3)
Chloride: 107 mmol/L (ref 98–111)
Creatinine, Ser: 0.72 mg/dL (ref 0.44–1.00)
GFR, Estimated: >60 mL/min (ref >60)
Glucose, Bld: 101 mg/dL — ABNORMAL HIGH (ref 70–99)
Potassium: 3.4 mmol/L — ABNORMAL LOW (ref 3.5–5.1)
Sodium: 136 mmol/L (ref 135–145)
Total Bilirubin: 11.3 mg/dL — ABNORMAL HIGH (ref 0.3–1.2)
Total Protein: 4.8 g/dL — ABNORMAL LOW (ref 6.5–8.1)

## 2021-10-22 MED ORDER — POTASSIUM CHLORIDE CRYS ER 20 MEQ PO TBCR
40.0000 meq | EXTENDED_RELEASE_TABLET | Freq: Once | ORAL | Status: AC
  Administered 2021-10-22: 40 meq via ORAL
  Filled 2021-10-22: qty 2

## 2021-10-22 MED ORDER — ACYCLOVIR 200 MG PO CAPS
400.0000 mg | ORAL_CAPSULE | Freq: Two times a day (BID) | ORAL | Status: DC
  Administered 2021-10-22 – 2021-10-24 (×5): 400 mg via ORAL
  Filled 2021-10-22 (×5): qty 2

## 2021-10-22 MED ORDER — LORAZEPAM 2 MG/ML IJ SOLN
1.0000 mg | INTRAMUSCULAR | Status: DC | PRN
Start: 2021-10-22 — End: 2021-10-24

## 2021-10-22 MED ORDER — GABAPENTIN 100 MG PO CAPS
100.0000 mg | ORAL_CAPSULE | Freq: Every day | ORAL | Status: DC
  Administered 2021-10-22 – 2021-10-23 (×2): 100 mg via ORAL
  Filled 2021-10-22 (×2): qty 1

## 2021-10-22 MED ORDER — POTASSIUM CHLORIDE CRYS ER 20 MEQ PO TBCR
40.0000 meq | EXTENDED_RELEASE_TABLET | Freq: Once | ORAL | Status: DC
Start: 2021-10-22 — End: 2021-10-22
  Filled 2021-10-22: qty 2

## 2021-10-22 MED ORDER — FERROUS SULFATE 325 (65 FE) MG PO TABS
325.0000 mg | ORAL_TABLET | Freq: Every day | ORAL | Status: DC
  Administered 2021-10-22 – 2021-10-24 (×3): 325 mg via ORAL
  Filled 2021-10-22 (×3): qty 1

## 2021-10-22 MED ORDER — AMLODIPINE BESYLATE 5 MG PO TABS
5.0000 mg | ORAL_TABLET | Freq: Every day | ORAL | Status: DC
  Administered 2021-10-22 – 2021-10-24 (×3): 5 mg via ORAL
  Filled 2021-10-22 (×3): qty 1

## 2021-10-22 MED ORDER — LACTULOSE 10 GM/15ML PO SOLN
30.0000 g | Freq: Two times a day (BID) | ORAL | Status: DC
Start: 2021-10-22 — End: 2021-10-24
  Filled 2021-10-22 (×5): qty 60

## 2021-10-22 MED ORDER — METOPROLOL SUCCINATE ER 50 MG PO TB24
50.0000 mg | ORAL_TABLET | Freq: Every day | ORAL | Status: DC
Start: 2021-10-22 — End: 2021-10-24
  Administered 2021-10-22 – 2021-10-24 (×3): 50 mg via ORAL
  Filled 2021-10-22 (×3): qty 1

## 2021-10-22 MED ORDER — ENOXAPARIN SODIUM 40 MG/0.4ML IJ SOSY
40.0000 mg | PREFILLED_SYRINGE | INTRAMUSCULAR | Status: DC
  Administered 2021-10-22 – 2021-10-23 (×2): 40 mg via SUBCUTANEOUS
  Filled 2021-10-22 (×2): qty 0.4

## 2021-10-22 MED ORDER — TRAMADOL HCL 50 MG PO TABS: 50.0000 mg | ORAL_TABLET | Freq: Two times a day (BID) | ORAL | Status: DC | PRN

## 2021-10-22 MED ORDER — ALBUTEROL SULFATE (2.5 MG/3ML) 0.083% IN NEBU
2.5000 mg | INHALATION_SOLUTION | RESPIRATORY_TRACT | Status: DC | PRN
Start: 2021-10-22 — End: 2021-10-24

## 2021-10-22 NOTE — TOC Progression Note (Addendum)
Transition of Care [facility]) - Progression Note    Patient Details  Name: Nancy Rodriguez MRN: [id number] Date of Birth: 03-Sep-1945  Transition of Care [facility]) CM/SW [place], RN Phone Number: 10/22/2021, 11:09 AM  Clinical Narrative:  RNCM notified by care team that the currently plan for the patient is to discharge to hospice home.  RNCM attempted to reach daughters, left voicemail.   Addendum:  0539 RNCM in to see patient and family at bedside.  Offered hospice provider choice as per Medicare comparison list.  Family reviewed, stated they would like inpatient hospice if possible.  They would like eval from [facility].  Adelene Amas notified.   Addendum 1438:  Patient and family made aware by MD that patient will not be eligible for hospice home inpatient unit.  Family states they would still like to speak to [facility], as they are sure patient would want to discharge home.  Patient amenable to returning home on discharge.  Daughter states she lives with patient but is physically not able to lift and perform physical care for patient.  RNCM explained that this would not fulfill criteria for inpatient hospice.  Adelene Amas made aware, will speak with patient and family re: [facility] hospice at home.   Expected Discharge Plan: Skilled Nursing Facility Barriers to Discharge: Continued Medical Work up  Expected Discharge Plan and Services Expected Discharge Plan: [place]                                               Social Determinants of Health (SDOH) Interventions    Readmission Risk Interventions     No data to display

## 2021-10-22 NOTE — Progress Notes (Signed)
[facility]) Hospital Liaison Note  Requested by Dr. Rosanne Gutting and Jiles Garter, [facility] to meet with patient and family to discuss hospice services.  Met with patient, son [name] and daughter [name] at bedside to discuss outpatient palliative services and hospice services. Patient is not inpatient eligible at this time but would be appropriate for hospice at home or to return to rehab with outpatient palliative.   Family questioning if there are any additional treatments at this time. Informed by Dr. Mal Misty and Billey Chang, NP that at this time there are no further oncology treatments, we can monitor labs over time to see if oncology treatment could be resumed. Family also questioning if there are further treatments to help her elevated liver lab values, per Dr Mal Misty, no further treatment options at this time.   Patient and family aware, all questions answered. Patient and family will discuss this evening options going forward and plan to share with Dr. Mal Misty, [facility] and hospice liaison tomorrow.  Please call with any questions.  Thank you, Margaretmary Eddy, BSN, RN [facility] Liaison [phone]

## 2021-10-22 NOTE — Progress Notes (Signed)
Progress Note    Nancy Rodriguez  [id number] DOB: 06/10/1945  DOA: 10/18/2021 PCP: Leonel Ramsay, MD      Brief Narrative:    Medical records reviewed and are as summarized below:  Nancy Rodriguez is a 76 y.o. female with medical history significant for light chain amyloidosis, amyloid liver disease who was referred to the emergency room for evaluation of increasing bilirubin level above her baseline.       Assessment/Plan:   Principal Problem:   Elevated bilirubin Active Problems:   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HCC)   Pancreatic cyst   Chronic kidney disease, stage 3a (HCC)   Malnutrition of moderate degree   Essential hypertension   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HCC)   Palliative care encounter    Hyperbilirubinemia, elevated liver enzymes No CBD stone or biliary tract dilatation noted on MRCP.  Hyperbilirubinemia suspected to be from Revlimid, Tylenol or amyloid liver disease. Bilirubin level remains high at 11.4.  Discontinue IV fluids.   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[place]) She was on chemotherapy revlimid, dexamethasone and daratumumab in the outpatient setting  Hypokalemia Replete potassium   Pancreatic cyst Stable on CT   Chronic kidney disease, stage 3a (HCC) Renal function at baseline   Malnutrition of moderate degree Nutritional supplements   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HCC) Hemoglobin 10.2, slightly improved from baseline   Essential hypertension Continue home amlodipine and metoprolol   Generalized weakness PT recommends discharge to SNF.  Follow-up with social worker to assist with disposition.   Goals of care were discussed with the patient at length at the bedside.  Diagnoses and prognosis were discussed.  She decided that she wanted to be DNR.  She also decided that she wanted to focus on comfort and requested comfort measures with hospice.  This was discussed with [name] on speaker phone in her room) agreeable with that  decision. [name], her son, also walked into the room during this conversation and also agreed with patient's decision to proceed with comfort measures.  Patient has been transitioned to comfort care.  Initially, she requested Tylenol and medicine has been discontinued but she later changed her mind and family requested that home medicines be resumed.  She and her family asked about eligibility for hospice house.  However, I doubt she will be eligible for hospice house at this time.  Hospice team has been consulted.    Diet Order     None              Consultants: Gastroenterologist, oncologist  Procedures: None    Medications:    acyclovir  400 mg Oral BID   amLODipine  5 mg Oral Daily   enoxaparin  40 mg Subcutaneous Q24H   ferrous sulfate  325 mg Oral Daily   gabapentin  100 mg Oral QHS   lactulose  30 g Oral BID   metoprolol succinate  50 mg Oral Daily   sertraline  50 mg Oral Daily   Continuous Infusions:     Anti-infectives (From admission, onward)    Start     Dose/Rate Route Frequency Ordered Stop   10/22/21 1400  acyclovir (ZOVIRAX) 200 MG capsule 400 mg        400 mg Oral 2 times daily 10/22/21 1313                Family Communication/Anticipated D/C date and plan/Code Status   DVT prophylaxis: enoxaparin (LOVENOX) injection 40 mg Start: 10/22/21  1400 SCDs Start: 10/19/21 0111     Code Status: DNR  Family Communication: Plan discussed with [name] over the phone and [name] at the bedside Disposition Plan: Plan to discharge home with hospice   Status is: Observation The patient will require care spanning > 2 midnights and should be moved to inpatient because: Awaiting placement to SNF       Subjective:   Interval events noted.  No abdominal pain or confusion.  She said she is depressed about her situation.    Objective:    Vitals:   10/21/21 2108 10/22/21 0754 10/22/21 1300 10/22/21 1410  BP: (!) 161/72 (!) 150/75   (!) 141/73  Pulse:  74  79  Resp:      Temp:  98.6 F (37 C)  98.2 F (36.8 C)  TempSrc:  Oral  Oral  SpO2:    100%  Weight:   55.3 kg    No data found.   Intake/Output Summary (Last 24 hours) at 10/22/2021 1518 Last data filed at 10/22/2021 0654 Gross per 24 hour  Intake 1289.33 ml  Output 550 ml  Net 739.33 ml   Filed Weights   10/22/21 1300  Weight: 55.3 kg    Exam:  GEN: NAD SKIN: Warm and dry EYES: EOMI. Icteric ENT: MMM CV: RRR PULM: CTA B ABD: soft, midline abdominal hernia, NT, +BS CNS: AAO x 3, non focal EXT: No edema or tenderness          Data Reviewed:   I have personally reviewed following labs and imaging studies:  Labs: Labs show the following:   Basic Metabolic Panel: Recent Labs  Lab 10/18/21 1606 10/20/21 0429 10/21/21 0356 10/22/21 0409  NA 139 139 135 136  K 4.0 3.5 3.7 3.4*  CL 110 113* 111 107  CO2 19* 21* 19* 23  GLUCOSE 132* 115* 105* 101*  BUN 30* '23 19 17  '$ CREATININE 0.98 0.89 0.75 0.72  CALCIUM 8.8* 8.5* 8.4* 8.4*   GFR Estimated Creatinine Clearance: 49.8 mL/min (by C-G formula based on SCr of 0.72 mg/dL). Liver Function Tests: Recent Labs  Lab 10/18/21 1606 10/19/21 0836 10/20/21 0429 10/21/21 0356 10/22/21 0409  AST 47* 33 44* 52* 49*  ALT 44 37 38 41 40  ALKPHOS 830* 721* 708* 735* 707*  BILITOT 11.6* 10.5* 10.7* 11.1* 11.3*  PROT 5.6* 5.2* 4.7* 4.9* 4.8*  ALBUMIN 2.4* 2.3* 2.1* 2.2* 2.2*   Recent Labs  Lab 10/18/21 1606  LIPASE 24   Recent Labs  Lab 10/18/21 2301  AMMONIA 36*   Coagulation profile Recent Labs  Lab 10/19/21 1535  INR 1.2    CBC: Recent Labs  Lab 10/18/21 1606 10/20/21 0429  WBC 7.2 6.6  HGB 10.2* 8.8*  HCT 29.3* 25.0*  MCV 81.4 77.9*  PLT 565* 487*   Cardiac Enzymes: No results for input(s): "CKTOTAL", "CKMB", "CKMBINDEX", "TROPONINI" in the last 168 hours. BNP (last 3 results) No results for input(s): "PROBNP" in the last 8760 hours. CBG: Recent Labs  Lab  10/21/21 1231 10/21/21 1708 10/21/21 2006 10/22/21 0805 10/22/21 1143  GLUCAP 169* 146* 129* 119* 137*   D-Dimer: No results for input(s): "DDIMER" in the last 72 hours. Hgb A1c: No results for input(s): "HGBA1C" in the last 72 hours. Lipid Profile: No results for input(s): "CHOL", "HDL", "LDLCALC", "TRIG", "CHOLHDL", "LDLDIRECT" in the last 72 hours. Thyroid function studies: No results for input(s): "TSH", "T4TOTAL", "T3FREE", "THYROIDAB" in the last 72 hours.  Invalid input(s): "FREET3"  Anemia work up: No results for input(s): "VITAMINB12", "FOLATE", "FERRITIN", "TIBC", "IRON", "RETICCTPCT" in the last 72 hours. Sepsis Labs: Recent Labs  Lab 10/18/21 1606 10/20/21 0429  WBC 7.2 6.6    Microbiology No results found for this or any previous visit (from the past 240 hour(s)).  Procedures and diagnostic studies:  No results found.             LOS: 1 day   Lottie Siska  [facility]   Pager on [email or web address]. If 7PM-7AM, please contact night-coverage at [email or web address]     10/22/2021, 3:18 PM

## 2021-10-23 ENCOUNTER — Other Ambulatory Visit: Payer: Self-pay

## 2021-10-23 DIAGNOSIS — R17 Unspecified jaundice: Secondary | ICD-10-CM | POA: Diagnosis not present

## 2021-10-23 DIAGNOSIS — N1831 Chronic kidney disease, stage 3a: Secondary | ICD-10-CM | POA: Diagnosis not present

## 2021-10-23 DIAGNOSIS — E854 Organ-limited amyloidosis: Secondary | ICD-10-CM | POA: Diagnosis not present

## 2021-10-23 DIAGNOSIS — I10 Essential (primary) hypertension: Secondary | ICD-10-CM | POA: Diagnosis not present

## 2021-10-23 DIAGNOSIS — C9000 Multiple myeloma not having achieved remission: Secondary | ICD-10-CM

## 2021-10-23 LAB — GLUCOSE, CAPILLARY
Glucose-Capillary: 153 mg/dL — ABNORMAL HIGH (ref 70–99)
Glucose-Capillary: 187 mg/dL — ABNORMAL HIGH (ref 70–99)

## 2021-10-23 MED ORDER — INSULIN ASPART 100 UNIT/ML IJ SOLN
0.0000 [IU] | Freq: Three times a day (TID) | INTRAMUSCULAR | Status: DC
  Administered 2021-10-23: 1 [IU] via SUBCUTANEOUS
  Filled 2021-10-23: qty 1

## 2021-10-23 NOTE — Progress Notes (Signed)
Progress Note    Nancy Rodriguez  [id number] DOB: 1946-02-01  DOA: 10/18/2021 PCP: Leonel Ramsay, MD      Brief Narrative:    Medical records reviewed and are as summarized below:  Nancy Rodriguez is a 76 y.o. female with medical history significant for light chain amyloidosis, amyloid liver disease who was referred to the emergency room for evaluation of increasing bilirubin level above her baseline.       Assessment/Plan:   Principal Problem:   Elevated bilirubin Active Problems:   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HCC)   Pancreatic cyst   Chronic kidney disease, stage 3a (HCC)   Malnutrition of moderate degree   Essential hypertension   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HCC)   Palliative care encounter    Hyperbilirubinemia, elevated liver enzymes No CBD stone or biliary tract dilatation noted on MRCP.  Hyperbilirubinemia suspected to be from Revlimid, Tylenol or amyloid liver disease. Bilirubin level is still elevated. She had initially requested to be discharged home with hospice.  However, she has rescinded her decision and prefers to go home with home health therapy.  She no longer wants to be on comfort care.  She will follow-up with palliative care team at home.  Case was discussed with Dr. Tasia Catchings, oncologist, who said patient can follow-up with her in the outpatient setting as previously scheduled.   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[place]) She was on chemotherapy revlimid, dexamethasone and daratumumab prior to admission  Hypokalemia Repeat potassium tomorrow   Pancreatic cyst Stable on CT   Chronic kidney disease, stage 3a (HCC) Renal function at baseline   Malnutrition of moderate degree Nutritional supplements   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HCC) Hemoglobin 10.2, slightly improved from baseline   Essential hypertension Continue home amlodipine and metoprolol   Generalized weakness PT recommended discharge to SNF.  Patient prefers to go home  with home health therapy.  She and her son want hospital equipment including bedside commode, wheelchair and hospital bed to be delivered home before she is discharged.  Shelton Silvas, Education officer, museum is Psychologist, educational delivery.  Plan was discussed with the patient and [name] (son) at the bedside   Diet Order             Diet heart healthy/carb modified Room service appropriate? Yes; Fluid consistency: Thin  Diet effective now                          Consultants: Copywriter, advertising, oncologist  Procedures: None    Medications:    acyclovir  400 mg Oral BID   amLODipine  5 mg Oral Daily   enoxaparin  40 mg Subcutaneous Q24H   ferrous sulfate  325 mg Oral Daily   gabapentin  100 mg Oral QHS   insulin aspart  0-6 Units Subcutaneous TID WC   lactulose  30 g Oral BID   metoprolol succinate  50 mg Oral Daily   sertraline  50 mg Oral Daily   Continuous Infusions:     Anti-infectives (From admission, onward)    Start     Dose/Rate Route Frequency Ordered Stop   10/22/21 1400  acyclovir (ZOVIRAX) 200 MG capsule 400 mg        400 mg Oral 2 times daily 10/22/21 1313                Family Communication/Anticipated D/C date and plan/Code Status   DVT prophylaxis: enoxaparin (LOVENOX) injection 40  mg Start: 10/22/21 1400 SCDs Start: 10/19/21 0111     Code Status: DNR  Family Communication: Plan discussed with [name] (son) at the bedside Disposition Plan: Plan to discharge home tomorrow with home health therapy   Status is: Inpatient Remains inpatient appropriate because: Awaiting hospital bed for discharge to home with home health therapy.         Subjective:   Interval events noted.  No abdominal pain, vomiting, confusion, chest pain or shortness of breath.  She feels depressed. [name], son was at the bedside.  Objective:    Vitals:   10/22/21 1410 10/22/21 1649 10/22/21 2011 10/23/21 1609  BP: (!) 141/73 136/65 (!) 155/71 130/70  Pulse:  79 77 86 67  Resp:  '16 18 16  '$ Temp: 98.2 F (36.8 C) 99.4 F (37.4 C) 98.4 F (36.9 C) 98.2 F (36.8 C)  TempSrc: Oral Oral Oral   SpO2: 100% 100% 99% 100%  Weight:       No data found.   Intake/Output Summary (Last 24 hours) at 10/23/2021 1726 Last data filed at 10/23/2021 0310 Gross per 24 hour  Intake --  Output 700 ml  Net -700 ml   Filed Weights   10/22/21 1300  Weight: 55.3 kg    Exam:  GEN: NAD SKIN: Warm and dry EYES: Icteric ENT: MMM CV: RRR PULM: CTA B ABD: soft, ND, midline abdominal hernia, +BS CNS: AAO x 3, non focal EXT: No edema or tenderness         Data Reviewed:   I have personally reviewed following labs and imaging studies:  Labs: Labs show the following:   Basic Metabolic Panel: Recent Labs  Lab 10/18/21 1606 10/20/21 0429 10/21/21 0356 10/22/21 0409  NA 139 139 135 136  K 4.0 3.5 3.7 3.4*  CL 110 113* 111 107  CO2 19* 21* 19* 23  GLUCOSE 132* 115* 105* 101*  BUN 30* '23 19 17  '$ CREATININE 0.98 0.89 0.75 0.72  CALCIUM 8.8* 8.5* 8.4* 8.4*   GFR Estimated Creatinine Clearance: 49.8 mL/min (by C-G formula based on SCr of 0.72 mg/dL). Liver Function Tests: Recent Labs  Lab 10/18/21 1606 10/19/21 0836 10/20/21 0429 10/21/21 0356 10/22/21 0409  AST 47* 33 44* 52* 49*  ALT 44 37 38 41 40  ALKPHOS 830* 721* 708* 735* 707*  BILITOT 11.6* 10.5* 10.7* 11.1* 11.3*  PROT 5.6* 5.2* 4.7* 4.9* 4.8*  ALBUMIN 2.4* 2.3* 2.1* 2.2* 2.2*   Recent Labs  Lab 10/18/21 1606  LIPASE 24   Recent Labs  Lab 10/18/21 2301  AMMONIA 36*   Coagulation profile Recent Labs  Lab 10/19/21 1535  INR 1.2    CBC: Recent Labs  Lab 10/18/21 1606 10/20/21 0429  WBC 7.2 6.6  HGB 10.2* 8.8*  HCT 29.3* 25.0*  MCV 81.4 77.9*  PLT 565* 487*   Cardiac Enzymes: No results for input(s): "CKTOTAL", "CKMB", "CKMBINDEX", "TROPONINI" in the last 168 hours. BNP (last 3 results) No results for input(s): "PROBNP" in the last 8760  hours. CBG: Recent Labs  Lab 10/21/21 2006 10/22/21 0805 10/22/21 1143 10/22/21 1659 10/23/21 1607  GLUCAP 129* 119* 137* 138* 153*   D-Dimer: No results for input(s): "DDIMER" in the last 72 hours. Hgb A1c: No results for input(s): "HGBA1C" in the last 72 hours. Lipid Profile: No results for input(s): "CHOL", "HDL", "LDLCALC", "TRIG", "CHOLHDL", "LDLDIRECT" in the last 72 hours. Thyroid function studies: No results for input(s): "TSH", "T4TOTAL", "T3FREE", "THYROIDAB" in the last 72 hours.  Invalid input(s): "FREET3" Anemia work up: No results for input(s): "VITAMINB12", "FOLATE", "FERRITIN", "TIBC", "IRON", "RETICCTPCT" in the last 72 hours. Sepsis Labs: Recent Labs  Lab 10/18/21 1606 10/20/21 0429  WBC 7.2 6.6    Microbiology No results found for this or any previous visit (from the past 240 hour(s)).  Procedures and diagnostic studies:  No results found.             LOS: 2 days   Monetta Lick  [facility]   Pager on [email or web address]. If 7PM-7AM, please contact night-coverage at [email or web address]     10/23/2021, 5:26 PM

## 2021-10-23 NOTE — TOC Progression Note (Addendum)
Transition of Care [facility]) - Progression Note    Patient Details  Name: Nancy Rodriguez MRN: [id number] Date of Birth: 1945-08-08  Transition of Care [facility]) CM/SW Contact  Eileen Stanford, LCSW Phone Number: 10/23/2021, 12:47 PM  Clinical Narrative:   Bed and bedside commode , hospital bed, and hoyar lift ordered through [employer]. [employer] to updated CSW on estimated delivery. CSW provided referral to several [facility] agencies awaiting responses. [facility]- can not take [facility]- unable to take [facility]- unable to take [place]- nursing is on 5 day delay and OT is out 2+ weeks [facility]- unable to take  [facility] - does not have therapy services available [facility]- referral sent      Expected Discharge Plan: [place] Barriers to Discharge: Continued Medical Work up  Expected Discharge Plan and Services Expected Discharge Plan: [place]                                               Social Determinants of Health (SDOH) Interventions    Readmission Risk Interventions     No data to display

## 2021-10-23 NOTE — Clinical Social Work Note (Cosign Needed)
    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

## 2021-10-23 NOTE — Progress Notes (Signed)
[facility] Liaison Note  MSW held family meeting with [name] (Anderson present via telephone call) to finalize dispo. plan for hospice. Lengthy conversation held and all settings of hospice explained in detail (home vs. [place]). All present voiced understanding and report that they have the information packet previously provided by [facility] staff member, Evelina Dun., on 7.9/  Family decided upon further conversation that they would like to pursue [place] services with [facility] PMT to follow in the home and later to transition to hospice if decline occurs. MSW voiced understanding and TOC/Bridget & MD/Dr. Mal Misty notified of above conversation.   Patient's information as been sent to [facility] PMT.   [facility] hospital liaison will follow patient for discharge disposition.   Please call with any questions/concerns.    Thank you for the opportunity to participate in this patient's care.   Daphene Calamity, MSW [facility] Liaison  [phone]

## 2021-10-24 DIAGNOSIS — N1831 Chronic kidney disease, stage 3a: Secondary | ICD-10-CM | POA: Diagnosis not present

## 2021-10-24 DIAGNOSIS — I10 Essential (primary) hypertension: Secondary | ICD-10-CM | POA: Diagnosis not present

## 2021-10-24 DIAGNOSIS — E854 Organ-limited amyloidosis: Secondary | ICD-10-CM | POA: Diagnosis not present

## 2021-10-24 DIAGNOSIS — R17 Unspecified jaundice: Secondary | ICD-10-CM | POA: Diagnosis not present

## 2021-10-24 LAB — CBC WITH DIFFERENTIAL/PLATELET
Abs Immature Granulocytes: 0.14 10*3/uL — ABNORMAL HIGH (ref 0.00–0.07)
Basophils Absolute: 0.1 10*3/uL (ref 0.0–0.1)
Basophils Relative: 1 %
Eosinophils Absolute: 0.1 10*3/uL (ref 0.0–0.5)
Eosinophils Relative: 1 %
HCT: 24.1 % — ABNORMAL LOW (ref 36.0–46.0)
Hemoglobin: 8.5 g/dL — ABNORMAL LOW (ref 12.0–15.0)
Immature Granulocytes: 2 %
Lymphocytes Relative: 14 %
Lymphs Abs: 1 10*3/uL (ref 0.7–4.0)
MCH: 27.3 pg (ref 26.0–34.0)
MCHC: 35.3 g/dL (ref 30.0–36.0)
MCV: 77.5 fL — ABNORMAL LOW (ref 80.0–100.0)
Monocytes Absolute: 0.4 10*3/uL (ref 0.1–1.0)
Monocytes Relative: 5 %
Neutro Abs: 5.5 10*3/uL (ref 1.7–7.7)
Neutrophils Relative %: 77 %
Platelets: 341 10*3/uL (ref 150–400)
RBC: 3.11 MIL/uL — ABNORMAL LOW (ref 3.87–5.11)
RDW: 25.4 % — ABNORMAL HIGH (ref 11.5–15.5)
WBC: 7.2 10*3/uL (ref 4.0–10.5)
nRBC: 0 % (ref 0.0–0.2)

## 2021-10-24 LAB — COMPREHENSIVE METABOLIC PANEL
ALT: 36 U/L (ref 0–44)
AST: 41 U/L (ref 15–41)
Albumin: 2 g/dL — ABNORMAL LOW (ref 3.5–5.0)
Alkaline Phosphatase: 691 U/L — ABNORMAL HIGH (ref 38–126)
Anion gap: 6 (ref 5–15)
BUN: 17 mg/dL (ref 8–23)
CO2: 25 mmol/L (ref 22–32)
Calcium: 8.3 mg/dL — ABNORMAL LOW (ref 8.9–10.3)
Chloride: 106 mmol/L (ref 98–111)
Creatinine, Ser: 0.83 mg/dL (ref 0.44–1.00)
GFR, Estimated: >60 mL/min (ref >60)
Glucose, Bld: 108 mg/dL — ABNORMAL HIGH (ref 70–99)
Potassium: 3.7 mmol/L (ref 3.5–5.1)
Sodium: 137 mmol/L (ref 135–145)
Total Bilirubin: 10 mg/dL — ABNORMAL HIGH (ref 0.3–1.2)
Total Protein: 4.6 g/dL — ABNORMAL LOW (ref 6.5–8.1)

## 2021-10-24 LAB — GLUCOSE, CAPILLARY: Glucose-Capillary: 109 mg/dL — ABNORMAL HIGH (ref 70–99)

## 2021-10-24 NOTE — Discharge Summary (Addendum)
Physician Discharge Summary   Patient: Nancy Rodriguez MRN: [id number] DOB: 11/18/1945  Admit date:     10/18/2021  Discharge date: 10/24/21  Discharge Physician: Jennye Boroughs   PCP: Leonel Ramsay, MD   Recommendations at discharge:   Follow-up with Dr. 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 [facility] Course:  [name] is a 76 y.o. female with medical history significant for light chain amyloidosis, amyloid liver disease hypertension, type 2 diabetes mellitus on insulin, CKD stage IIIa, anemia of chronic disease, iron deficiency anemia, who was referred to the emergency room for evaluation of increasing bilirubin level above her baseline.   There was no evidence of CBD stone or biliary tract dilation on MRCP.  Persistent hyperbilirubinemia was thought to be due to progressive amyloid liver disease from Revlimid or Tylenol.  She was treated with IV fluids.  She was evaluated by the gastroenterologist and her oncologist.  Prognosis is poor and oncologist recommended palliative consultation.  Patient decided to be DNR and requested comfort care with hospice.  Plan was to discharge patient home with hospice.  However, she rescinded her decision about hospice and decided to go home with home health therapy.  She and her family requested a hospital bed, bedside commode and a wheelchair at discharge.  Of note, her daughter reported episodes of hypoglycemia at home.   In the hospital, she had not been using long-acting insulin.  To avoid future hypoglycemic episodes, it was recommended that both her long-acting insulin and short-acting insulin be held at discharge.  She was scheduled to see her oncologist at [facility] for follow-up on the day of discharge so she requested an early discharge.  She is medically stable for discharge.  Discharge plan was discussed with her daughter and her son, [name], at the bedside.         Consultants: Oncologist, gastroenterologist Procedures performed: None Disposition: Home health Diet recommendation:  Discharge Diet Orders (From admission, onward)     Start     Ordered   10/24/21 0000  Diet - low sodium heart healthy        10/24/21 0858           Cardiac and Carb modified diet DISCHARGE MEDICATION: Allergies as of 10/24/2021       Reactions   Benazepril Anaphylaxis, Swelling   TONGUE AND LIPS   Lisinopril Swelling   Tolmetin Swelling   Nsaids Swelling        Medication List     STOP taking these medications    [facility] Vitamin C 500 MG tablet Generic drug: ascorbic acid   dexamethasone 4 MG tablet Commonly known as: DECADRON   enoxaparin 40 MG/0.4ML injection Commonly known as: LOVENOX   ferrous sulfate 325 (65 FE) MG EC tablet   FreeStyle Libre 14 Day Sensor Misc   HYDROcodone-acetaminophen 5-325 MG tablet Commonly known as: NORCO/VICODIN   insulin lispro 100 UNIT/ML injection Commonly known as: HUMALOG   Lantus SoloStar  100 UNIT/ML Solostar Pen Generic drug: insulin glargine   lenalidomide 10 MG capsule Commonly known as: REVLIMID       TAKE these medications    acyclovir 400 MG tablet Commonly known as: ZOVIRAX Take 1 tablet (400 mg total) by mouth 2 (two) times daily.   albuterol 108 (90 Base) MCG/ACT inhaler Commonly known as: VENTOLIN HFA Inhale 2 puffs into the lungs every 4 (four) hours as needed for wheezing or shortness of breath.   amLODipine 5 MG  tablet Commonly known as: NORVASC Take 5 mg by mouth daily.   EPINEPHrine 0.3 mg/0.3 mL Soaj injection Commonly known as: EPI-PEN Inject 0.3 mg into the muscle as needed.   gabapentin 100 MG capsule Commonly known as: NEURONTIN Take 100 mg by mouth at bedtime.   Klor-Con M20 20 MEQ tablet Generic drug: potassium chloride SA TAKE 1 TABLET BY MOUTH TWICE A DAY   lactulose 10 GM/15ML solution Commonly known as: CHRONULAC Take 30 g by mouth 2 (two) times daily.   loperamide 2 MG capsule Commonly known as: IMODIUM Take 1 capsule (2 mg total) by mouth See admin instructions. Initial: 4 mg, followed by 2 mg after each loose stool, maximum 16 mg within 24 hours.   metoprolol succinate 50 MG 24 hr tablet Commonly known as: TOPROL-XL Take 50 mg by mouth daily.   omeprazole 40 MG capsule Commonly known as: PRILOSEC Take 40 mg by mouth 2 (two) times daily.   polyethylene glycol 17 g packet Commonly known as: MIRALAX / GLYCOLAX Take 17 g by mouth daily as needed for mild constipation.   sertraline 25 MG tablet Commonly known as: ZOLOFT Take 50 mg by mouth daily.   torsemide 20 MG tablet Commonly known as: DEMADEX Take 20 mg by mouth daily as needed.   traMADol 50 MG tablet Commonly known as: ULTRAM Take 1 tablet (50 mg total) by mouth every 12 (twelve) hours as needed for moderate pain.               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   Imaging Studies: MR ABDOMEN MRCP W WO CONTAST  Result Date: 10/19/2021 CLINICAL DATA:  Elevated bilirubin levels.  History of cirrhosis EXAM: MRI ABDOMEN WITHOUT AND WITH CONTRAST (INCLUDING MRCP) TECHNIQUE: Multiplanar multisequence MR imaging of the abdomen was performed both before and after the administration of intravenous contrast. Heavily T2-weighted images of  the biliary and pancreatic ducts were obtained, and three-dimensional MRCP images were rendered by post processing. CONTRAST:  95m GADAVIST GADOBUTROL 1 MMOL/ML IV SOLN COMPARISON:  CT AP 10/18/2021 FINDINGS: Lower chest: No acute findings. Hepatobiliary: The liver has a cirrhotic appearance with contour nodularity and hypertrophy of the caudate lobe and lateral segment of left hepatic lobe. Left lobe of liver cyst containing thin internal area of eccentric septation is noted measuring 1.3 cm. Heterogeneous enhancement is identified anterior right hepatic lobe. No definite focal enhancing liver lesions identified. There are multiple stones identified within the dependent portion of the gallbladder measuring up to 3 mm. Mild gallbladder wall thickening measures up to 3 mm which is nonspecific in the setting of cirrhosis and portal venous hypertension. There is no intrahepatic bile duct dilatation. The CBD is upper limits of normal in caliber measuring 6 mm. No choledocholithiasis identified Pancreas: No main duct dilatation or inflammation identified. Multiple cystic lesions are identified within the uncinate process, head and tail of pancreas. -Bilobed cyst containing a internal area of hairline  septation measures 2.4 x 1.5 cm within the uncinate process, image 28/4. -Within the tail of pancreas there is a bilobed cystic structure containing a thin internal area of septation measuring 1.8 cm, image 25/4. -Unilocular cyst within the anterior head of pancreas measures 7 mm, image 27/4. Spleen:  Within normal limits in size and appearance. Adrenals/Urinary Tract: Normal adrenal glands. No suspicious mass or hydronephrosis identified. Several small simple appearing cysts are noted. No follow-up imaging recommended. Stomach/Bowel: Visualized portions within the abdomen are unremarkable. Vascular/Lymphatic: Aortic atherosclerosis without aneurysm. No adenopathy. Other: There is a moderate volume of ascites identified within the upper abdomen. Musculoskeletal: No suspicious bone lesions identified. IMPRESSION: 1. Morphologic features of the liver compatible with cirrhosis. 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 2. Gallstones. Mild gallbladder wall thickening is nonspecific in the setting of cirrhosis and portal venous hypertension. 3. No signs of biliary ductal dilatation.  No choledocholithiasis. 4. Multiple cystic lesions are identified within the uncinate process, head and tail of pancreas. The largest measures 2.4 cm. Follow-up imaging with repeat MRI in 6 months is recommended. This recommendation follows ACR consensus guidelines: Management of Incidental Pancreatic Cysts: A White Paper of the ACR Incidental Findings Committee. J Am Coll Radiol 27121;97:588-325 5.  Aortic Atherosclerosis (ICD10-I70.0). Electronically Signed   By: TKerby MoorsM.D.   On: 10/19/2021 05:18   CT Abdomen Pelvis W Contrast  Result Date: 10/18/2021 CLINICAL DATA:  Elevated ammonia level and bilirubin level with painless jaundice, initial encounter EXAM: CT ABDOMEN AND PELVIS WITH CONTRAST TECHNIQUE: Multidetector CT imaging of the abdomen and pelvis was performed using the standard protocol following  bolus administration of intravenous contrast. RADIATION DOSE REDUCTION: This exam was performed according to the departmental dose-optimization program which includes automated exposure control, adjustment of the mA and/or kV according to patient size and/or use of iterative reconstruction technique. CONTRAST:  717mOMNIPAQUE IOHEXOL 300 MG/ML  SOLN COMPARISON:  MRI from 04/06/2021, ultrasound from 06/30/2021 FINDINGS: Lower chest: Lung bases are free of acute infiltrate or sizable effusion. Hepatobiliary: Gallbladder is well distended with multiple dependent gallstones. Mottled decreased attenuation is identified throughout the right lobe of the liver without discrete mass. These changes are felt to be related to progressive cirrhotic change. Cyst is again noted in the left lobe of the liver stable in appearance from the prior exam. Pancreas: Pancreas again demonstrates cystic lesions within the head and body of the pancreas  stable in appearance from the prior exam. Spleen: Normal in size without focal abnormality. Adrenals/Urinary Tract: Adrenal glands are within normal limits. Kidneys demonstrate a normal enhancement pattern bilaterally. No renal calculi or obstructive changes are noted. Bladder is well distended. Stomach/Bowel: The appendix is within normal limits. No obstructive or inflammatory changes colon are seen. Small bowel and stomach are within limits. Vascular/Lymphatic: Aortic atherosclerosis. No enlarged abdominal or pelvic lymph nodes. Reproductive: Uterus and bilateral adnexa are unremarkable. Other: Moderate ascites is noted consistent with the underlying cirrhotic change. Musculoskeletal: Degenerative changes of the lumbar spine are noted. Right hip fixation is noted. Progressive L4 compression deformity is noted but appears chronic. IMPRESSION: Mottled enhancement within the right lobe of the liver likely related to the patient's known underlying cirrhosis. This may represent some regenerating  nodules. Nonemergent MRI when the patient can tolerate adequate breath hold technique may be helpful. Cholelithiasis without complicating factors. Moderate ascites related to cirrhotic change. Cystic lesions within the pancreas stable from prior exam. Electronically Signed   By: Inez Catalina M.D.   On: 10/18/2021 22:02   DG Wrist 2 Views Left  Result Date: 09/27/2021 CLINICAL DATA:  Left wrist pain after fall yesterday. EXAM: LEFT WRIST - 2 VIEW COMPARISON:  None available FINDINGS: There is 1 mm ulnar negative variance. Moderate to severe thumb carpometacarpal and moderate triscaphe joint space narrowing and peripheral osteophytosis. Mild-to-moderate second carpometacarpal joint space narrowing and peripheral osteophytosis. Moderate distal radial styloid degenerative spur contacts a degenerative spur at the distal lateral aspect of the scaphoid. Bone overlap in different regions of the scaphoid on frontal and lateral views limits evaluation for an acute fracture. This includes within the region of the distal lateral aspect of the scaphoid where a prominent degenerative spur overlies the distal radial styloid. There is decreased scaphoid bone mineralization in this region with multiple lucent likely bone nutrient foramina. No definite acute fracture is visualized. IMPRESSION: 1. 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 2. Moderate degenerative changes of the distal radial styloid and adjacent distal lateral aspect of the scaphoid. 3. No definite acute fracture is seen. If there is there is snuffbox tenderness and clinical concern for a radiographically occult fracture, consider splinting and follow-up radiographs in 10 days. Electronically Signed   By: Yvonne Kendall M.D.   On: 09/27/2021 11:00   DG FEMUR, MIN 2 VIEWS RIGHT  Result Date: 09/26/2021 CLINICAL DATA:  Status post right IM nail fixation EXAM: RIGHT FEMUR 2 VIEWS; PORTABLE PELVIS 1-2 VIEWS COMPARISON:   09/26/2021 intraoperative images and hip radiographs FINDINGS: Status post intramedullary nail fixation of a previously noted proximal right femoral diaphyseal fracture with improved alignment of the fracture components. No perihardware lucency or additional acute fracture in the pelvis or right femur. Air within the soft tissues, not unexpected post surgically. Superficial skin staples. IMPRESSION: Expected postoperative appearance, status post intramedullary nail fixation of a right femoral fracture. Electronically Signed   By: Merilyn Baba M.D.   On: 09/26/2021 17:38   Pelvis Portable  Result Date: 09/26/2021 CLINICAL DATA:  Status post right IM nail fixation EXAM: RIGHT FEMUR 2 VIEWS; PORTABLE PELVIS 1-2 VIEWS COMPARISON:  09/26/2021 intraoperative images and hip radiographs FINDINGS: Status post intramedullary nail fixation of a previously noted proximal right femoral diaphyseal fracture with improved alignment of the fracture components. No perihardware lucency or additional acute fracture in the pelvis or right femur. Air within the soft tissues, not unexpected post surgically. Superficial skin staples. IMPRESSION: Expected postoperative appearance,  status post intramedullary nail fixation of a right femoral fracture. Electronically Signed   By: Merilyn Baba M.D.   On: 09/26/2021 17:38   DG FEMUR, MIN 2 VIEWS RIGHT  Result Date: 09/26/2021 CLINICAL DATA:  Right proximal femoral ORIF EXAM: RIGHT FEMUR 2 VIEWS COMPARISON:  Pelvis and right hip radiographs 09/26/2021 FINDINGS: Images were performed intraoperatively without the presence of a radiologist. Redemonstration of oblique proximal right femoral diaphyseal fracture. New long-stem cephalo medullary nail fixation of this fracture with improved now anatomic alignment. No complication visualized. Total fluoroscopy images: 6 Total fluoroscopy time: 183 seconds Please see intraoperative findings for further detail. IMPRESSION: Intraoperative  fluoroscopy for proximal right femoral ORIF. Electronically Signed   By: Yvonne Kendall M.D.   On: 09/26/2021 17:04   DG C-Arm 1-60 Min-No Report  Result Date: 09/26/2021 Fluoroscopy was utilized by the requesting physician.  No radiographic interpretation.   DG C-Arm 1-60 Min-No Report  Result Date: 09/26/2021 Fluoroscopy was utilized by the requesting physician.  No radiographic interpretation.   DG C-Arm 1-60 Min-No Report  Result Date: 09/26/2021 Fluoroscopy was utilized by the requesting physician.  No radiographic interpretation.   DG Chest Portable 1 View  Result Date: 09/26/2021 CLINICAL DATA:  Fall, preop EXAM: PORTABLE CHEST 1 VIEW COMPARISON:  None Available. FINDINGS: Elevation of the right hemidiaphragm. Heart and mediastinal contours are within normal limits. No focal opacities or effusions. No acute bony abnormality. IMPRESSION: No active disease. Electronically Signed   By: Rolm Baptise M.D.   On: 09/26/2021 03:11   DG Hip Unilat W or Wo Pelvis 2-3 Views Right  Result Date: 09/26/2021 CLINICAL DATA:  Fall EXAM: DG HIP (WITH OR WITHOUT PELVIS) 2-3V RIGHT COMPARISON:  None Available. FINDINGS: There is a fracture through the proximal shaft of the right femur. Femoral shaft is displaced medially relative to the femoral neck. No subluxation or dislocation at the hip. IMPRESSION: Displaced right proximal femoral shaft fracture. Electronically Signed   By: Rolm Baptise M.D.   On: 09/26/2021 03:07   CT Cervical Spine Wo Contrast  Result Date: 09/26/2021 CLINICAL DATA:  Facial trauma, blunt.  Unwitnessed fall. EXAM: CT CERVICAL SPINE WITHOUT CONTRAST TECHNIQUE: Multidetector CT imaging of the cervical spine was performed without intravenous contrast. Multiplanar CT image reconstructions were also generated. RADIATION DOSE REDUCTION: This exam was performed according to the departmental dose-optimization program which includes automated exposure control, adjustment of the mA and/or kV  according to patient size and/or use of iterative reconstruction technique. COMPARISON:  None Available. FINDINGS: Alignment: No subluxation Skull base and vertebrae: No acute fracture. No primary bone lesion or focal pathologic process. Soft tissues and spinal canal: No prevertebral fluid or swelling. No visible canal hematoma. Disc levels:  Diffuse degenerative disc disease and facet disease. Upper chest: No acute findings Other: None IMPRESSION: No acute bony abnormality. Electronically Signed   By: Rolm Baptise M.D.   On: 09/26/2021 02:46   CT Head Wo Contrast  Result Date: 09/26/2021 CLINICAL DATA:  Head trauma, minor (Age >= 65y).  Unwitnessed fall EXAM: CT HEAD WITHOUT CONTRAST TECHNIQUE: Contiguous axial images were obtained from the base of the skull through the vertex without intravenous contrast. RADIATION DOSE REDUCTION: This exam was performed according to the departmental dose-optimization program which includes automated exposure control, adjustment of the mA and/or kV according to patient size and/or use of iterative reconstruction technique. 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 No new hemorrhage, hydrocephalus or acute infarction. No mass effect or midline shift. Mild atrophy. Vascular: No hyperdense vessel or unexpected calcification. Skull: No acute calvarial abnormality. Sinuses/Orbits: No acute findings Other: None IMPRESSION: Small bilateral chronic subdural hematomas, stable or slightly smaller since prior study. No acute intracranial abnormality. Electronically Signed   By: Rolm Baptise M.D.   On: 09/26/2021 02:45    Microbiology: Results for orders placed or performed in visit on 08/22/21  C difficile quick screen w PCR reflex     Status: None   Collection Time: 08/22/21 10:11 AM   Specimen: STOOL  Result Value Ref Range Status   C Diff  antigen NEGATIVE NEGATIVE Final   C Diff toxin NEGATIVE NEGATIVE Final   C Diff interpretation No C. difficile detected.  Final    Comment: Performed at [facility], [address] [place], [place] [address]    Labs: CBC: Recent Labs  Lab 10/18/21 1606 10/20/21 0429 10/24/21 0443  WBC 7.2 6.6 7.2  NEUTROABS  --   --  5.5  HGB 10.2* 8.8* 8.5*  HCT 29.3* 25.0* 24.1*  MCV 81.4 77.9* 77.5*  PLT 565* 487* 407   Basic Metabolic Panel: Recent Labs  Lab 10/18/21 1606 10/20/21 0429 10/21/21 0356 10/22/21 0409 10/24/21 0443  NA 139 139 135 136 137  K 4.0 3.5 3.7 3.4* 3.7  CL 110 113* 111 107 106  CO2 19* 21* 19* 23 25  GLUCOSE 132* 115* 105* 101* 108*  BUN 30* '23 19 17 17  '$ CREATININE 0.98 0.89 0.75 0.72 0.83  CALCIUM 8.8* 8.5* 8.4* 8.4* 8.3*   Liver Function Tests: Recent Labs  Lab 10/19/21 0836 10/20/21 0429 10/21/21 0356 10/22/21 0409 10/24/21 0443  AST 33 44* 52* 49* 41  ALT 37 38 41 40 36  ALKPHOS 721* 708* 735* 707* 691*  BILITOT 10.5* 10.7* 11.1* 11.3* 10.0*  PROT 5.2* 4.7* 4.9* 4.8* 4.6*  ALBUMIN 2.3* 2.1* 2.2* 2.2* 2.0*   CBG: Recent Labs  Lab 10/22/21 1143 10/22/21 1659 10/23/21 1607 10/23/21 2010 10/24/21 0810  GLUCAP 137* 138* 153* 187* 109*    Discharge time spent: greater than 30 minutes.  Signed: Jennye Boroughs, MD [facility] 10/24/2021

## 2021-10-24 NOTE — Care Management Important Message (Signed)
Important Message  Patient Details  Name: Nancy Rodriguez MRN: [id number] Date of Birth: Jun 23, 1945   Medicare Important Message Given:  N/A - LOS <3 / Initial given by admissions     [name] 10/24/2021, 8:18 AM

## 2021-10-24 NOTE — TOC Progression Note (Signed)
Transition of Care [facility]) - Progression Note    Patient Details  Name: Nancy Rodriguez MRN: [id number] Date of Birth: Apr 04, 1946  Transition of Care [facility]) CM/SW Contact  Eileen Stanford, LCSW Phone Number: 10/24/2021, 9:44 AM  Clinical Narrative:   Per Suanne Marker with [employer] all DME to be delivered today. Pt's son [name] also aware. CSW also explained to [name] that no HH was secure at this time and that orders had been faxed to [facility] for review. Pt's son understanding.    Expected Discharge Plan: Skilled Nursing Facility Barriers to Discharge: Continued Medical Work up  Expected Discharge Plan and Services Expected Discharge Plan: [place]         Expected Discharge Date: 10/24/21                                     Social Determinants of Health (SDOH) Interventions    Readmission Risk Interventions     No data to display

## 2021-10-27 ENCOUNTER — Other Ambulatory Visit: Payer: Medicare Other

## 2021-10-27 ENCOUNTER — Ambulatory Visit: Payer: Medicare Other | Admitting: Medical Oncology

## 2021-10-27 ENCOUNTER — Ambulatory Visit: Payer: Medicare Other

## 2021-11-01 ENCOUNTER — Ambulatory Visit: Payer: Medicare Other | Admitting: Nurse Practitioner

## 2021-11-01 NOTE — Progress Notes (Deleted)
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  Past Medical History    Past Medical History:  Diagnosis Date   (HFpEF) heart failure with preserved ejection fraction ([place])    a. 05/2020 Echo: EF 60-65%; b. 08/2020 cMRI ([facility]): EF 71%, no delayed hyperenhancement to suggest scar/infiltrative dzs. Nl RV fxn. Mild BAE. Triv MR, mild TR; c. 06/2021 Echo: EF 50-55%, no rwma, mild basal-septal LVH, GrII DD. Nl RV size/fxn. Mild MR. Ao sclerosis.   AL amyloidosis ([place])    Anemia due to chronic kidney disease    Anemia in chronic kidney disease   CKD (chronic kidney disease), stage III (HCC)    Diabetes mellitus without complication ([place])    History of cardiovascular stress test    a. 03/2012 Stress Echo([facility]): nl study.   Hypercholesterolemia    Hypertension    MGUS (monoclonal gammopathy of unknown significance)    Osteoarthritis    Rheumatoid arthritis ([place])    Past Surgical History:  Procedure Laterality Date   HERNIA REPAIR     INTRAMEDULLARY (IM) NAIL INTERTROCHANTERIC Right 09/26/2021   Procedure: INTRAMEDULLARY (IM) NAIL INTERTROCHANTRIC;  Surgeon: Nancy Park, MD;  Location: [facility] ORS;  Service: Orthopedics;  Laterality: Right;   PARATHYROIDECTOMY      Allergies  Allergies  Allergen Reactions   Benazepril Anaphylaxis and Swelling    TONGUE AND LIPS     Lisinopril Swelling   Tolmetin Swelling   Nsaids Swelling    History of Present Illness    76 year old female with a HFpEF, 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  She was previously evaluated by Nancy Rodriguez in early 2022, in the setting of lower extremity swelling, which improved after initiation of Lasix 40 mg daily and regular use of compression socks.  Echocardiogram at that time showed an EF of 60 to 65% with mild asymmetric LVH of the basal-septal segment.  Mild MR and aortic sclerosis were also noted.  Cardiac MRI was performed at [facility] in May 2022, which showed normal LV function without hyperenhancement to suggest scar or infiltration.  In June 2022, Lasix was reduced to 20 mg daily and she was subsequently lost to follow-up.  In January 2023, she was admitted to [facility] with hyperglycemia and acute kidney injury complicated by UTI, along with a fall with head CT showing chronic appearing subdural hematomas.  She follow-up here in February 2023 and was noted to have mild lower extremity swelling.  At that time, she was using torsemide 20 mg daily as needed, which she had not been receiving.  In the setting of amyloidosis and chemotherapy, a repeat echocardiogram was performed in March 2023, which showed an EF of 50 to 55% without regional motion abnormalities, mild basal-septal LVH, grade 2 diastolic function, normal RV function, and mild MR and aortic sclerosis.  Nancy Rodriguez was last seen in cardiology clinic on March 16 at which time she is doing reasonably well and only requiring torsemide about once a week or less.  Home Medications    Current Outpatient  Medications  Medication Sig Dispense Refill   acyclovir (ZOVIRAX) 400 MG tablet Take 1 tablet (400 mg total) by mouth 2 (two) times daily. 60 tablet 2   albuterol (VENTOLIN HFA) 108 (90 Base) MCG/ACT inhaler Inhale 2 puffs into the lungs every 4 (four) hours as needed for wheezing or shortness of breath.     amLODipine (NORVASC) 5 MG tablet Take 5 mg by mouth daily.     EPINEPHrine 0.3 mg/0.3 mL IJ SOAJ injection Inject  0.3 mg into the muscle as needed.     gabapentin (NEURONTIN) 100 MG capsule Take 100 mg by mouth at bedtime.     KLOR-CON M20 20 MEQ tablet TAKE 1 TABLET BY MOUTH TWICE A DAY 60 tablet 1   lactulose (CHRONULAC) 10 GM/15ML solution Take 30 g by mouth 2 (two) times daily.     loperamide (IMODIUM) 2 MG capsule Take 1 capsule (2 mg total) by mouth See admin instructions. Initial: 4 mg, followed by 2 mg after each loose stool, maximum 16 mg within 24 hours. 60 capsule 0   metoprolol succinate (TOPROL-XL) 50 MG 24 hr tablet Take 50 mg by mouth daily.     omeprazole (PRILOSEC) 40 MG capsule Take 40 mg by mouth 2 (two) times daily.     polyethylene glycol (MIRALAX / GLYCOLAX) 17 g packet Take 17 g by mouth daily as needed for mild constipation. 14 each 0   sertraline (ZOLOFT) 25 MG tablet Take 50 mg by mouth daily.     torsemide (DEMADEX) 20 MG tablet Take 20 mg by mouth daily as needed.     traMADol (ULTRAM) 50 MG tablet Take 1 tablet (50 mg total) by mouth every 12 (twelve) hours as needed for moderate pain. 10 tablet 0   No current facility-administered medications for this visit.     Review of Systems    ***.  All other systems reviewed and are otherwise negative except as noted above.    Physical Exam    VS:  There were no vitals taken for this visit. , BMI There is no height or weight on file to calculate BMI.     GEN: Well nourished, well developed, in no acute distress. HEENT: normal. Neck: Supple, no JVD, carotid bruits, or masses. Cardiac: RRR, no murmurs, rubs, or gallops. No clubbing, cyanosis, edema.  Radials/DP/PT 2+ and equal bilaterally.  Respiratory:  Respirations regular and unlabored, clear to auscultation bilaterally. GI: Soft, nontender, nondistended, BS + x 4. MS: no deformity or atrophy. Skin: warm and dry, no rash. Neuro:  Strength and sensation are intact. Psych: Normal affect.  Accessory Clinical Findings    ECG personally reviewed by me today - *** - no acute  changes.  Lab Results  Component Value Date   WBC 7.2 10/24/2021   HGB 8.5 (L) 10/24/2021   HCT 24.1 (L) 10/24/2021   MCV 77.5 (L) 10/24/2021   PLT 341 10/24/2021   Lab Results  Component Value Date   CREATININE 0.83 10/24/2021   BUN 17 10/24/2021   NA 137 10/24/2021   K 3.7 10/24/2021   CL 106 10/24/2021   CO2 25 10/24/2021   Lab Results  Component Value Date   ALT 36 10/24/2021   AST 41 10/24/2021   ALKPHOS 691 (H) 10/24/2021   BILITOT 10.0 (H) 10/24/2021   No results found for: "CHOL", "HDL", "LDLCALC", "LDLDIRECT", "TRIG", "CHOLHDL"  Lab Results  Component Value Date   HGBA1C 6.4 (H) 09/27/2021    Assessment & Plan  1.  ***   Murray Hodgkins, NP 11/01/2021, 12:54 PM

## 2021-11-02 ENCOUNTER — Encounter: Payer: Self-pay | Admitting: Nurse Practitioner

## 2021-11-06 ENCOUNTER — Encounter: Payer: Self-pay | Admitting: Oncology

## 2021-11-06 NOTE — Telephone Encounter (Signed)
Signing encounter, see note 05/06/20

## 2021-11-13 ENCOUNTER — Inpatient Hospital Stay: Payer: Medicare Other | Admitting: Oncology

## 2021-11-13 ENCOUNTER — Inpatient Hospital Stay: Payer: Medicare Other | Attending: Oncology

## 2021-11-13 ENCOUNTER — Encounter: Payer: Self-pay | Admitting: Oncology

## 2021-11-14 ENCOUNTER — Telehealth: Payer: Self-pay | Admitting: Internal Medicine

## 2021-11-14 NOTE — Telephone Encounter (Signed)
Patient refused to schedule fu, removed from recall

## 2021-11-15 NOTE — Telephone Encounter (Signed)
Noted  

## 2021-12-04 ENCOUNTER — Encounter: Payer: Self-pay | Admitting: Nurse Practitioner

## 2021-12-04 ENCOUNTER — Ambulatory Visit: Payer: Medicare Other | Admitting: Nurse Practitioner

## 2021-12-04 DIAGNOSIS — K529 Noninfective gastroenteritis and colitis, unspecified: Secondary | ICD-10-CM

## 2021-12-04 DIAGNOSIS — Z515 Encounter for palliative care: Secondary | ICD-10-CM

## 2021-12-04 DIAGNOSIS — R531 Weakness: Secondary | ICD-10-CM

## 2021-12-04 DIAGNOSIS — R5382 Chronic fatigue, unspecified: Secondary | ICD-10-CM

## 2021-12-04 NOTE — Progress Notes (Signed)
[facility] Consult Note Telephone: [phone]  Fax: [phone]    Date of encounter: 12/04/21 12:55 PM PATIENT NAME: Nancy Rodriguez [address] [address]   [phone] (home)  DOB: 1945-09-24 MRN: [id number] PRIMARY CARE PROVIDER:    Leonel Ramsay, MD,  [place] [address] [phone]  RESPONSIBLE PARTY:    Contact Information     Name Relation Home Work Mobile   [place] Daughter   [phone]   Nancy Rodriguez, Nancy Rodriguez   [phone]   Nancy Rodriguez,Nancy Rodriguez Daughter   [phone]      Due to the COVID-19 crisis, this visit was done via telemedicine from my office and it was initiated and consent by this patient and or family.  I connected with Nancy Rodriguez daughter with Nancy Rodriguez OR PROXY on 12/04/21 by a telephone as video not available  enabled telemedicine application and verified that I am speaking with the correct person. ASSESSMENT AND PLAN / RECOMMENDATIONS:  Symptom Management/Plan: 1. Advance Care Planning;  Ongoing discussions, currently continues chemotherapy   2. Fatigue, weakness secondary to decompensation related to monoclonal gammopathy. We talked about sleep, sleep hygiene, importance of rest, energy conservation.    3. Chronic diarrhea; talked about lomotil, imodium. We talked about bowel regimen; diet, nutrition, oral hydration. We talked about OTC. We talked about weights, appetite, supplements  11/10/2021 weight 118 lbs BMI 20.68  4. DM; discussed at length, requesting a 14 day sensor glucose monitor system; sent into pharmacy; dm/nutritional education done   5. Palliative care encounter; Palliative care encounter; Palliative medicine team will continue to support patient, patient's family, and medical team. Visit consisted of counseling and education dealing with the complex and emotionally intense issues of symptom management and palliative care in the  setting of serious and potentially life-threatening illness   Follow up Palliative Care Visit: Palliative care will continue to follow for complex medical decision making, advance care planning, and clarification of goals. Return 8 weeks   I spent 31 minutes providing this consultation. More than 50% of the time in this consultation was spent in counseling  PPS: 40%   Chief Complaint: Initial palliative consult for complex medical decision making   HISTORY OF PRESENT ILLNESS:  Nancy Rodriguez is a 76 y.o. year old female  with multiple medical problems including monoclonal gammopathy, CKD stage 3b, anemia, RA, diabetes, cirrhosis, and AL amyloidosis. We talked about pc f/u visit purpose and role in poc. We talked about how Nancy Rodriguez has been feeling, tired and fatigue. We talked about ros, functional abilities. We talked about medical goals including dm, nutritional education. Discussed weights, appetite. We talked about quality of life, mostly spent counseling, discussed using [facility] for resources including adult diapers. We talked about f/u pc visit, Nancy Rodriguez in agreement, appointment f/u scheduled. Therapeutic listening, emotional support provided, Questions answered.    History obtained from review of EMR, discussion with Nancy Rodriguez, daughter, with Nancy Rodriguez.  I reviewed available labs, medications, imaging, studies and related documents from the EMR.  Records reviewed and summarized above.    ROS 10 point system reviewed with daughter Nancy Rodriguez with Nancy Rodriguez all negative except HPI   Physical Exam: deferred I discussed the limitations of evaluation and management by telemedicine. The patient expressed understanding and agreed to proceed.   Palliative Care was asked to follow this patient by consultation request of  Leonel Ramsay, MD to address advance care planning and complex  medical decision making. This is a follow up visit.  Thank you for the opportunity to  participate in the care of Nancy Rodriguez.  The palliative care team will continue to follow. Please call our office at [phone] if we can be of additional assistance.   Nancy Streat Ihor Gully, NP

## 2021-12-22 ENCOUNTER — Other Ambulatory Visit: Payer: Self-pay | Admitting: Oncology

## 2022-01-05 ENCOUNTER — Other Ambulatory Visit: Payer: Self-pay | Admitting: Oncology

## 2022-01-17 ENCOUNTER — Other Ambulatory Visit: Payer: Self-pay | Admitting: Oncology

## 2022-01-18 ENCOUNTER — Encounter: Payer: Self-pay | Admitting: Oncology

## 2022-01-18 ENCOUNTER — Other Ambulatory Visit: Payer: Self-pay | Admitting: Oncology

## 2022-01-18 NOTE — Telephone Encounter (Signed)
   Ref Range & Units 2 mo ago  Potassium 3.5 - 5.0 mmol/L 3.5

## 2022-01-24 ENCOUNTER — Encounter: Payer: Medicare Other | Admitting: Nurse Practitioner

## 2022-01-25 NOTE — Progress Notes (Signed)
This encounter was created in error - please disregard.

## 2022-02-08 ENCOUNTER — Other Ambulatory Visit: Payer: Self-pay | Admitting: Oncology

## 2022-02-13 ENCOUNTER — Other Ambulatory Visit: Payer: Self-pay | Admitting: Internal Medicine

## 2022-02-13 ENCOUNTER — Other Ambulatory Visit: Payer: Self-pay | Admitting: Oncology

## 2022-02-14 ENCOUNTER — Encounter: Payer: Self-pay | Admitting: Oncology

## 2022-02-16 ENCOUNTER — Other Ambulatory Visit: Payer: Self-pay | Admitting: Oncology

## 2022-02-19 ENCOUNTER — Telehealth: Payer: Self-pay | Admitting: Oncology

## 2022-02-19 NOTE — Telephone Encounter (Signed)
Completed - pt appt moved to earlier time, pt daughter confirmed

## 2022-03-05 DIAGNOSIS — I700 Atherosclerosis of aorta: Secondary | ICD-10-CM | POA: Insufficient documentation

## 2022-03-07 ENCOUNTER — Other Ambulatory Visit: Payer: Medicare Other

## 2022-03-07 ENCOUNTER — Ambulatory Visit: Payer: Medicare Other | Admitting: Oncology

## 2022-03-13 ENCOUNTER — Other Ambulatory Visit: Payer: Self-pay | Admitting: Oncology

## 2022-03-14 ENCOUNTER — Other Ambulatory Visit: Payer: Self-pay | Admitting: Oncology

## 2022-03-27 ENCOUNTER — Telehealth: Payer: Self-pay

## 2022-03-27 ENCOUNTER — Other Ambulatory Visit: Payer: Self-pay

## 2022-03-27 DIAGNOSIS — C9000 Multiple myeloma not having achieved remission: Secondary | ICD-10-CM

## 2022-03-27 DIAGNOSIS — K766 Portal hypertension: Secondary | ICD-10-CM | POA: Insufficient documentation

## 2022-03-27 NOTE — Telephone Encounter (Signed)
-----   Message from [name], MD sent at 03/27/2022  8:48 AM EST ----- Dr.Kang asks me to continue follow up on this patient. Please schedule her to see me in early Jan lab MD cbc cmp myeloma panel and light chain ratio, beta 2 microglobulin, PT, PTT, LDH. Same day lab mD is fine, thanks.   zy

## 2022-03-27 NOTE — Telephone Encounter (Signed)
Nancy Rodriguez can you schedule this patient and inform her of appointment. I have entered labs.

## 2022-04-03 ENCOUNTER — Other Ambulatory Visit: Payer: Medicare Other

## 2022-04-03 ENCOUNTER — Ambulatory Visit: Payer: Medicare Other | Admitting: Oncology

## 2022-04-17 ENCOUNTER — Telehealth: Payer: Self-pay

## 2022-04-17 ENCOUNTER — Other Ambulatory Visit: Payer: Self-pay | Admitting: Oncology

## 2022-04-17 NOTE — Telephone Encounter (Signed)
TC to patients daughter to schedule in home PC visit with RN/SW team.  Call unsuccessful. SW LVM.

## 2022-04-24 ENCOUNTER — Other Ambulatory Visit: Payer: Medicare Other

## 2022-04-24 DIAGNOSIS — Z515 Encounter for palliative care: Secondary | ICD-10-CM

## 2022-04-24 NOTE — Progress Notes (Signed)
Palliative care SW outreached patients daughter, [name], to provide telephonic check in on patient.   Daughter shared that patient is doing well since previous PC visit with NP. No issues or declines reported. SW informed daughter of changes and new PC criteria, as far as NP's no longer following patients in the home. Daughter stated understanding. Daughter continues to be primary caregiver. Patient has [facility] in [facility], however unsure when or if they will be continuing services.   Next PC visit is scheduled for 05/09/22 '@1pm'$ . With RN/SW team.

## 2022-05-08 ENCOUNTER — Ambulatory Visit: Payer: Medicare Other | Admitting: Oncology

## 2022-05-08 ENCOUNTER — Other Ambulatory Visit: Payer: Medicare Other

## 2022-05-09 ENCOUNTER — Other Ambulatory Visit: Payer: Medicare Other

## 2022-05-09 ENCOUNTER — Ambulatory Visit: Payer: Medicare Other | Admitting: Oncology

## 2022-05-09 VITALS — BP 150/70 | HR 82 | Temp 97.5°F | Wt 122.5 lb

## 2022-05-09 DIAGNOSIS — Z515 Encounter for palliative care: Secondary | ICD-10-CM

## 2022-05-09 NOTE — Progress Notes (Signed)
COMMUNITY PALLIATIVE CARE SW NOTE  PATIENT NAME: Nancy Rodriguez DOB: 1946/01/07 MRN: [id number]  PRIMARY CARE PROVIDER: Leonel Ramsay, MD  RESPONSIBLE PARTY:  Acct ID - Guarantor Home Phone Work Phone Relationship Acct Type  [id number] PAYTYN, [phone]  Self P/F     [address], [place], [place] [address]     PLAN OF CARE and INTERVENTIONS:              GOALS OF CARE/ ADVANCE CARE PLANNING:    Goals include to maximize quality of life and assist with pain management. Our advance care planning conversation included a discussion about:    The value and importance of advance care planning  Review and updating or creation of an advance directive document.                          Patient is a full code. SW provided patient and daughter POA AD.   2.        SOCIAL/EMOTIONAL/SPIRITUAL ASSESSMENT/ INTERVENTIONS:         Palliative care encounter: SW and RN completed joint in home visit with patient and daughter.   Functional changes/updates: Patient with liver cancer, not currently receiving treatment at this time. Patients daughter helps manage her diabetes and blood pressure. Patient also reports frequent falls this year. Patient is otherwise stable at this time. Patient reports good appetite. Patient is ambulatory with RW. Patient sleeps in hospital that is located in living room.   Psychosocial assessment: completed.   In home support: patient resides in home with daughter. Daughter is primary caregiver. SW provided daughter and patient with [facility] resource for respite support.  Transportation: no needs.  Food: no food insecurities witnessed.   Safety and long term planning: patient feels safe in her home and desires to remain in her home.  SW discussed goals, reviewed care plan, provided emotional support, used active and reflective listening in the form of reciprocity emotional response. Questions and concerns were addressed. The patient/family was  encouraged to call with any additional questions and/or concerns. PC Provided general support and encouragement, no other unmet needs identified. Will continue to follow.   3.         PATIENT/CAREGIVER EDUCATION/ COPING:   Appearance: well groomed, appropriate given situation  Mental Status: Alert/oriented. Eye Contact: Good. Able to engage in proper eye contact  Thought Process: rational  Thought Content: not assessed  Speech: normal  Mood: Normal and calm Affect: Congruent to endorsed mood, full ranging Insight: normal Judgement: normal  Interaction Style: Cooperative   Patient A&O and able to make needs known, patient able to engage in fluent conversation and answer questions appropriately. Questions addressed and support provided to family/caregivers. Patient has 6 children whom are supportive.   4.         PERSONAL EMERGENCY PLAN:  patient will call 9-1-1 for emergencies.    5.         COMMUNITY RESOURCES COORDINATION/ HEALTH CARE NAVIGATION:  patients daughter manages her care.    6.      FINANCIAL CONCERNS/NEEDS: Medicaid discussed, patient would like to apply. SW will mail patient application.                         Primary Health Insurance:  [facility] Medicare Secondary Health Insurance: none Prescription Coverage: Yes, no history of difficulty obtaining or affording prescriptions reported.     SOCIAL  HX:  Social History   Tobacco Use   Smoking status: Never   Smokeless tobacco: Never  Substance Use Topics   Alcohol use: No    CODE STATUS: Full code  ADVANCED DIRECTIVES: discussed MOST FORM COMPLETE:  discussed and reviewed. HOSPICE EDUCATION PROVIDED: N  PPS: patient is independent with ADL's and ambulatory with RW.   Time spent: 50 min    [place], [place]

## 2022-05-09 NOTE — Progress Notes (Signed)
PATIENT NAME: Nancy Rodriguez DOB: [birth year] MRN: [id number]  PRIMARY CARE PROVIDER: Leonel Ramsay, MD  RESPONSIBLE PARTY:  Acct ID - Guarantor Home Phone Work Phone Relationship Acct Type  [id number] [name], [phone]  Self P/F     [address] [place], [place] [address]   Home visit completed with patient, daughter and [name], [place].  ACP:  Reviewed MOST form.  SW provided [facility] paperwork.  Ascites:  Patient endorses ascites has been unchanged.  Patient has declined paracentesis last month. She is not having any shortness of breath or abdominal pain and did not feel this was necessary.  She would proceed if she becomes uncomfortable.   Appetite:  Patient endorses eating well.  Daughter weighs patient weekly.  Weights have been trending down this month.  Started at 126 lbs at the being of the month and down to 122 yesterday.    Liver Ca:  Treatments currently on hold.  Patient will do a follow up with oncology to see if treatment needs to continue.   Lower Extremity Edema:  Compression stockings are in place.  Daughter endorses improvement in edema.  We discussed elevating feet as much as possible.  Mobility:  Using a rolling walker for safe ambulation.  Daughter reports frequent falls in the last couple of months.   CODE STATUS: Full ADVANCED DIRECTIVES: No MOST FORM: No PPS: 40%   PHYSICAL EXAM:   VITALS: Today's Vitals   05/09/22 1312  BP: (!) 150/70  Pulse: 82  Temp: (!) 97.5 F (36.4 C)  SpO2: 98%    LUNGS: clear to auscultation  CARDIAC: Cor RRR}  EXTREMITIES: 1-2+ bilateral lower extremity edema. SKIN: Skin color, texture, turgor normal. No rashes or lesions or normal  NEURO: positive for gait problems       Lorenza Burton, RN

## 2022-05-11 ENCOUNTER — Inpatient Hospital Stay (HOSPITAL_BASED_OUTPATIENT_CLINIC_OR_DEPARTMENT_OTHER): Payer: Medicare Other | Admitting: Oncology

## 2022-05-11 ENCOUNTER — Encounter: Payer: Self-pay | Admitting: Oncology

## 2022-05-11 ENCOUNTER — Inpatient Hospital Stay: Payer: Medicare Other | Attending: Oncology

## 2022-05-11 VITALS — BP 125/67 | HR 66 | Temp 96.1°F | Wt 125.5 lb

## 2022-05-11 DIAGNOSIS — D631 Anemia in chronic kidney disease: Secondary | ICD-10-CM | POA: Insufficient documentation

## 2022-05-11 DIAGNOSIS — Z79899 Other long term (current) drug therapy: Secondary | ICD-10-CM | POA: Insufficient documentation

## 2022-05-11 DIAGNOSIS — N1831 Chronic kidney disease, stage 3a: Secondary | ICD-10-CM

## 2022-05-11 DIAGNOSIS — K77 Liver disorders in diseases classified elsewhere: Secondary | ICD-10-CM

## 2022-05-11 DIAGNOSIS — E8581 Light chain (AL) amyloidosis: Secondary | ICD-10-CM

## 2022-05-11 DIAGNOSIS — I130 Hypertensive heart and chronic kidney disease with heart failure and stage 1 through stage 4 chronic kidney disease, or unspecified chronic kidney disease: Secondary | ICD-10-CM | POA: Diagnosis not present

## 2022-05-11 DIAGNOSIS — C9000 Multiple myeloma not having achieved remission: Secondary | ICD-10-CM

## 2022-05-11 DIAGNOSIS — R188 Other ascites: Secondary | ICD-10-CM | POA: Insufficient documentation

## 2022-05-11 DIAGNOSIS — K862 Cyst of pancreas: Secondary | ICD-10-CM

## 2022-05-11 DIAGNOSIS — E854 Organ-limited amyloidosis: Secondary | ICD-10-CM | POA: Diagnosis not present

## 2022-05-11 LAB — CBC WITH DIFFERENTIAL/PLATELET
Abs Immature Granulocytes: 0.11 10*3/uL — ABNORMAL HIGH (ref 0.00–0.07)
Basophils Absolute: 0.1 10*3/uL (ref 0.0–0.1)
Basophils Relative: 1 %
Eosinophils Absolute: 0.1 10*3/uL (ref 0.0–0.5)
Eosinophils Relative: 2 %
HCT: 29.9 % — ABNORMAL LOW (ref 36.0–46.0)
Hemoglobin: 10.5 g/dL — ABNORMAL LOW (ref 12.0–15.0)
Immature Granulocytes: 2 %
Lymphocytes Relative: 34 %
Lymphs Abs: 1.7 10*3/uL (ref 0.7–4.0)
MCH: 26.3 pg (ref 26.0–34.0)
MCHC: 35.1 g/dL (ref 30.0–36.0)
MCV: 74.9 fL — ABNORMAL LOW (ref 80.0–100.0)
Monocytes Absolute: 0.7 10*3/uL (ref 0.1–1.0)
Monocytes Relative: 15 %
Neutro Abs: 2.2 10*3/uL (ref 1.7–7.7)
Neutrophils Relative %: 46 %
Platelets: 458 10*3/uL — ABNORMAL HIGH (ref 150–400)
RBC: 3.99 MIL/uL (ref 3.87–5.11)
RDW: 25.3 % — ABNORMAL HIGH (ref 11.5–15.5)
WBC: 4.8 10*3/uL (ref 4.0–10.5)
nRBC: 0 % (ref 0.0–0.2)

## 2022-05-11 LAB — COMPREHENSIVE METABOLIC PANEL
ALT: 23 U/L (ref 0–44)
AST: 43 U/L — ABNORMAL HIGH (ref 15–41)
Albumin: 2.9 g/dL — ABNORMAL LOW (ref 3.5–5.0)
Alkaline Phosphatase: 459 U/L — ABNORMAL HIGH (ref 38–126)
Anion gap: 12 (ref 5–15)
BUN: 34 mg/dL — ABNORMAL HIGH (ref 8–23)
CO2: 24 mmol/L (ref 22–32)
Calcium: 8.8 mg/dL — ABNORMAL LOW (ref 8.9–10.3)
Chloride: 104 mmol/L (ref 98–111)
Creatinine, Ser: 1.35 mg/dL — ABNORMAL HIGH (ref 0.44–1.00)
GFR, Estimated: 40 mL/min — ABNORMAL LOW (ref >60)
Glucose, Bld: 150 mg/dL — ABNORMAL HIGH (ref 70–99)
Potassium: 3.2 mmol/L — ABNORMAL LOW (ref 3.5–5.1)
Sodium: 140 mmol/L (ref 135–145)
Total Bilirubin: 3.3 mg/dL — ABNORMAL HIGH (ref 0.3–1.2)
Total Protein: 6.6 g/dL (ref 6.5–8.1)

## 2022-05-11 LAB — PROTIME-INR
INR: 1.2 (ref 0.8–1.2)
Prothrombin Time: 15 seconds (ref 11.4–15.2)

## 2022-05-11 LAB — LACTATE DEHYDROGENASE: LDH: 130 U/L (ref 98–192)

## 2022-05-11 LAB — APTT: aPTT: 35 seconds (ref 24–36)

## 2022-05-11 NOTE — Assessment & Plan Note (Signed)
Discussed with patient about paracentesis.  Patient declines due to currently she is asymptomatic.  Continue observation

## 2022-05-11 NOTE — Progress Notes (Signed)
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  Continue observation. Recommend patient to continue follow-up with palliative care service. She follows up with me in [place] alternatively.  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  Bilirubin level decreased PT/INR PTT normal. Observation.  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HCC) Hemoglobin is above 10, stable.  Pancreatic cyst Cystic pancreatic cyst, MRI findings were reviewed and discussed with patient.Possible pseudocyst, less likely cystic pancreatic neoplasm.  Given her poor prognosis from amyloidosis, recommend observation.  Ascites Discussed with patient about paracentesis.  Patient declines due to currently she is asymptomatic.  Continue observation   Orders Placed This Encounter  Procedures   CBC with Differential/Platelet    Standing Status:   Future    Standing Expiration Date:   05/12/2023   Kappa/lambda light chains    Standing Status:   Future    Standing Expiration Date:   05/12/2023   Comprehensive metabolic panel    Standing Status:   Future    Standing Expiration Date:   05/12/2023   Multiple Myeloma Panel (SPEP&IFE w/QIG)    Standing Status:   Future    Standing Expiration Date:   05/12/2023   Beta 2 microglobulin, serum    Standing Status:   Future    Standing Expiration Date:   05/12/2023   Lactate dehydrogenase    Standing Status:   Future    Standing Expiration Date:   05/12/2023   APTT    Standing Status:   Future    Standing Expiration Date:   05/12/2023   Protime-INR    Standing Status:   Future    Standing Expiration Date:   05/12/2023   Follow up in 2 months.  All questions were  answered. The patient knows to call the clinic with any problems, questions or concerns.  [name], MD, PhD [facility] Hematology Oncology 05/11/2022   HISTORY OF PRESENTING ILLNESS:   Nancy Rodriguez is a  77 y.o.  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  She was seen by Hematology Oncology at [facility] on 02/14/2017.  07/25/2007  SPEP showed M protein of 0.35, IFE showed IgG lamda 09/22/2007  Bone survey negative.  02/15/14 IgG 930, SPEP M protien 0.22, free kappa light chain 3.59, lamda 2.92, ratio 1.23  Hypercalcemia, resolved after stopping HCTZ.  # Transaminitis 03/28/20 US abdomen showed left liver lobe Complex 1.7 cm cyst  # 04/07/20 MRI abdomen w/wo contrast showed 2 benign liver cysts, 2 nonspecific hypovascular irighr liver lesions, abnormal bone marrow signal of L1 vertebral body. Patient was  advised to proceed with bone marrow biopsy and she declined.  # referred her to established care with GI and was seen on 04/19/20,  # Liver biopsy showed amyloidosis. [place] MS/MS showed AL type # 05/20/20 recommend bone marrow biopsy which is scheduled on 05/24/2020.  Patient changed her mind and ask a biopsy to be canceled.  My team and I had multiple phone discussion with patient's daughter [name] and later with [name].  Patient agreed with bone marrow biopsy  and a biopsy was scheduled on 06/09/2020.  05/20/20 NT proBNP  was normal,  TSH slightly increased, normal T4 Normal factor X Normal coags Troponin 19. Refer to cardiology for evaluation. May need cardiac MRI  # 06/01/2020 2D echo result was reviewed and discussed with patient.  Patient has normal LVEF 60-65%. Mild asymmetric left ventricular hypertrophy.  Left ventricular diastolic parameters are  indeterminate. average left ventricular global longitudinal strain is -16.8 %.  # 06/09/2020, bone marrow biopsy showed monoclonal plasmacytosis, 8%, amyloid deposit present.  Absent iron stores. 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 t (14;20) #06/17/2020, further discussed about diagnosis and treatment plan.  Patient declined bone marrow transplant evaluation.  Decision was made to proceed with Dara-CyborD chemotherapy treatments. #May 2022, second opinion at [facility] with Dr. Tracey Harries.who agrees with current treatment plan.  He recommends to lower dexamethasone to 20 mg and decrease premed Tylenol to 325 mg  09/01/2020 cardiac MRI was performed at [facility].  Left ventricle is normal in size.  Mild to moderate basal septal hypertrophy.  Hyper dynamic LV function.  LVEF 71%.  Right ventricle is normal in size and wall thickness.  Systolic function normal.  Mild bi atrial enlargement, no significant aortic valve stenosis or regurgitation.  Trivial mitral regurgitation, mild tricuspid regurgitation and a trivial pulmonic regurgitation.  No evidence of MI, scarring or infiltration.  #History of major depression listed in outside problem list.  previous diagnosis and treatment details are not available to me in EMR  I have referred her to psychiatrist  #  vaginal bleeding which stopped- she was seen by GYN and recommended for biopsy.   # She has poor IV access and was not able to receive IV Emend and IV Aloxi. She does not want to have med port.   # GERD   Dr. Ola Spurr has increased her omeprazole to 40 mg daily. She feels symptoms are improved.  # 10/08/2020, cervical spine showed cervical spondylosis.  Spinal canal diagnosis and a posterior disc osteophyte complex and superimposed small central disc protrusion contact the ventral spinal cord at C4-C5.  Multilevel neuroforaminal narrowing. bilateral atlantooccipital joint effusions, multilevel grade 1  spondylolisthesis. MRI lumbar spine without contrast showed acute or subacute fracture of L4.   Moderate spinal stenosis at L3-4 has progressed. Shallow left foraminal disc protrusion has progressed. Moderate subarticular stenosis on the left also with progression  Moderate spinal stenosis L4-5 with progression. Moderate subarticular stenosis on the left with progression  # 10/25/2020 seen by [facility] who recommend patient to switch Dara CyBorD to Dara RD  # 11/03/2020 Revlimid (lenalidomide) 10 mg by mouth daily for 14 days, then hold for 7 days. Pt was started on a shortened cycle in order to remain on similar schedule with infusion.   # 11/22/2020 seen by hepatologist Dr.Kappus Rodman [place]. Recommended to start Zoloft '50mg'$  daily for pruritis # 11/24/2020 cycle 2 Revlimid 10 mg, she finished 14 days.  Rest of the course was held due to anemia.  Patient declined blood transfusion.  # Vaginal bleeding  patient was seen by Dr. Leafy Ro,  on megace '40mg'$  daily. 12/22/2020 repeat EMBx - not diagnostic due to insufficient squamous component.   03/03/2021, ultrasound abdomen complete showed possible hypoechoic mass in the pancreatic tail.  1.8 x 1.2 x 1.4 cm.  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  Cholelithiasis.  MRI has been ordered for further evaluation and is scheduled.  +  Falls and [facility].  Patient was found to have marked hyperglycemia, AKI, UTI. 05/03/2021, CT head showed chronic appearing subdural hematoma, measuring up to 1.6 on the left and 0.9 on the right. 05/18/2021, patient was seen by cardiology Dr.End, patient was recommended to continue torsemide 20 mg daily as needed for edema.  Patient was recommended to discontinue aspirin 81 mg due to frequent falls and chronic subdural hematoma.  Patient has not had further falls.  She has home physical therapy   INTERVAL HISTORY Nancy Rodriguez is a 77 y.o. female who has above history reviewed by  me today presents for follow up visit for management of AL amyloidosis Patient was accompanied by her daughter today.  Last visit was 09/18/2021. Patient follows up with [facility] oncology and hepatology. + Chronic diarrhea, GI Dr.Kappus recommended endoscopy evaluation and she declined.   She has increased abdominal girth and distention.  Denies any abdominal pressure or pain.  Denies fever, nausea, vomiting.   Review of Systems  Constitutional:  Positive for fatigue. Negative for appetite change, chills, fever and unexpected weight change.  HENT:   Negative for hearing loss, nosebleeds and voice change.   Eyes:  Negative for eye problems and icterus.  Respiratory:  Negative for chest tightness and cough.   Cardiovascular:  Negative for chest pain and leg swelling.  Gastrointestinal:  Positive for abdominal distention. Negative for abdominal pain, blood in stool and nausea.  Endocrine: Negative for hot flashes.  Genitourinary:  Negative for difficulty urinating and frequency.   Musculoskeletal:  Positive for back pain. Negative for arthralgias.  Skin:  Negative for itching and rash.  Neurological:  Positive for numbness. Negative for extremity weakness.  Hematological:  Negative for adenopathy.  Psychiatric/Behavioral:  Negative for confusion.     MEDICAL HISTORY:  Past Medical History:  Diagnosis Date   (HFpEF) heart failure with preserved ejection fraction ([place])    a. 05/2020 Echo: EF 60-65%; b. 08/2020 cMRI ([facility]): EF 71%, no delayed hyperenhancement to suggest scar/infiltrative dzs. Nl RV fxn. Mild BAE. Triv MR, mild TR; c. 06/2021 Echo: EF 50-55%, no rwma, mild basal-septal LVH, GrII DD. Nl RV size/fxn. Mild MR. Ao sclerosis.   AL amyloidosis ([place])    Anemia due to chronic kidney disease    Anemia in chronic kidney disease   CKD (chronic kidney disease), stage III (HCC)    Diabetes mellitus without complication ([place])    History of cardiovascular stress test    a. 03/2012 Stress  Echo([facility]): nl study.   Hypercholesterolemia    Hypertension    MGUS (monoclonal gammopathy of unknown significance)    Osteoarthritis    Rheumatoid arthritis ([place])     SURGICAL HISTORY: Past Surgical History:  Procedure Laterality Date   HERNIA REPAIR     INTRAMEDULLARY (IM) NAIL INTERTROCHANTERIC Right 09/26/2021   Procedure: INTRAMEDULLARY (IM) NAIL INTERTROCHANTRIC;  Surgeon: Thornton Park, MD;  Location: [facility] ORS;  Service: Orthopedics;  Laterality: Right;   PARATHYROIDECTOMY      SOCIAL HISTORY: Social History   Socioeconomic History   Marital status: Widowed    Spouse name: Not on file   Number of children: 6   Years of education: Not on file   Highest education level: Not on file  Occupational History   Not on file  Tobacco Use   Smoking status: Never   Smokeless tobacco: Never  Vaping Use   Vaping Use: Never used  Substance and Sexual Activity   Alcohol use: No  Drug use: Never   Sexual activity: Not on file  Other Topics Concern   Not on file  Social History Narrative   Not on file   Social Determinants of Health   Financial Resource Strain: Not on file  Food Insecurity: Not on file  Transportation Needs: Not on file  Physical Activity: Not on file  Stress: Not on file  Social Connections: Not on file  Intimate Partner Violence: Not on file    FAMILY HISTORY: Family History  Problem Relation Age of Onset   Diabetes Daughter    Diabetes Son    Dementia Mother    Cancer Father    Esophageal cancer Sister    Brain cancer Brother     ALLERGIES:  is allergic to benazepril, lisinopril, tolmetin, and nsaids.  MEDICATIONS:  Current Outpatient Medications  Medication Sig Dispense Refill   acyclovir (ZOVIRAX) 400 MG tablet Take 1 tablet (400 mg total) by mouth 2 (two) times daily. 60 tablet 2   albuterol (VENTOLIN HFA) 108 (90 Base) MCG/ACT inhaler Inhale 2 puffs into the lungs every 4 (four) hours as needed for wheezing or shortness of breath.      amLODipine (NORVASC) 5 MG tablet Take 5 mg by mouth daily.     EPINEPHrine 0.3 mg/0.3 mL IJ SOAJ injection Inject 0.3 mg into the muscle as needed.     gabapentin (NEURONTIN) 100 MG capsule Take 100 mg by mouth at bedtime.     KLOR-CON M20 20 MEQ tablet TAKE 1 TABLET BY MOUTH TWICE A DAY 60 tablet 1   lactulose (CHRONULAC) 10 GM/15ML solution Take 30 g by mouth 2 (two) times daily.     loperamide (IMODIUM) 2 MG capsule Take 1 capsule (2 mg total) by mouth See admin instructions. Initial: 4 mg, followed by 2 mg after each loose stool, maximum 16 mg within 24 hours. 60 capsule 0   metoprolol succinate (TOPROL-XL) 50 MG 24 hr tablet Take 50 mg by mouth daily.     omeprazole (PRILOSEC) 40 MG capsule Take 40 mg by mouth 2 (two) times daily.     polyethylene glycol (MIRALAX / GLYCOLAX) 17 g packet Take 17 g by mouth daily as needed for mild constipation. 14 each 0   sertraline (ZOLOFT) 25 MG tablet Take 50 mg by mouth daily.     spironolactone (ALDACTONE) 100 MG tablet Take 200 mg by mouth daily.     torsemide (DEMADEX) 20 MG tablet Take 40 mg by mouth daily.     traMADol (ULTRAM) 50 MG tablet Take 1 tablet (50 mg total) by mouth every 12 (twelve) hours as needed for moderate pain. 10 tablet 0   torsemide (DEMADEX) 20 MG tablet Take 20 mg by mouth daily as needed. (Patient not taking: Reported on 05/11/2022)     No current facility-administered medications for this visit.     PHYSICAL EXAMINATION: ECOG PERFORMANCE STATUS: 2 - Symptomatic, <50% confined to bed Vitals:   05/11/22 1022  BP: 125/67  Pulse: 66  Temp: (!) 96.1 F (35.6 C)  SpO2: 99%   Filed Weights   05/11/22 1022  Weight: 125 lb 8 oz (56.9 kg)    Physical Exam Constitutional:      General: She is not in acute distress.    Appearance: She is ill-appearing.  HENT:     Head: Normocephalic and atraumatic.  Eyes:     General: No scleral icterus. Cardiovascular:     Rate and Rhythm: Normal rate and regular rhythm.  Heart sounds: Murmur heard.  Pulmonary:     Effort: Pulmonary effort is normal. No respiratory distress.     Breath sounds: No wheezing.  Abdominal:     General: Bowel sounds are normal. There is distension.     Palpations: Abdomen is soft.  Musculoskeletal:        General: No deformity. Normal range of motion.     Cervical back: Normal range of motion and neck supple.     Comments: Bilateral lower extremities trace edema  Skin:    General: Skin is warm and dry.     Findings: No erythema or rash.     Comments: Several subcentimeter subcutaneous firm nodules on abdomen wall  Neurological:     Mental Status: She is alert. Mental status is at baseline.     Cranial Nerves: No cranial nerve deficit.     Coordination: Coordination normal.  Psychiatric:        Mood and Affect: Mood normal.     LABORATORY DATA:  I have reviewed the data as listed Lab Results  Component Value Date   WBC 4.8 05/11/2022   HGB 10.5 (L) 05/11/2022   HCT 29.9 (L) 05/11/2022   MCV 74.9 (L) 05/11/2022   PLT 458 (H) 05/11/2022   Recent Labs    10/19/21 0836 10/20/21 0429 10/22/21 0409 10/24/21 0443 05/11/22 0959  NA  --    < > 136 137 140  K  --    < > 3.4* 3.7 3.2*  CL  --    < > 107 106 104  CO2  --    < > '23 25 24  '$ GLUCOSE  --    < > 101* 108* 150*  BUN  --    < > 17 17 34*  CREATININE  --    < > 0.72 0.83 1.35*  CALCIUM  --    < > 8.4* 8.3* 8.8*  GFRNONAA  --    < > >60 >60 40*  PROT 5.2*   < > 4.8* 4.6* 6.6  ALBUMIN 2.3*   < > 2.2* 2.0* 2.9*  AST 33   < > 49* 41 43*  ALT 37   < > 40 36 23  ALKPHOS 721*   < > 707* 691* 459*  BILITOT 10.5*   < > 11.3* 10.0* 3.3*  BILIDIR 6.2*  --   --   --   --   IBILI 4.3*  --   --   --   --    < > = values in this interval not displayed.    Iron/TIBC/Ferritin/ %Sat    Component Value Date/Time   IRON 23 (L) 02/16/2021 1042   TIBC 316 02/16/2021 1042   FERRITIN 28 02/16/2021 1042   IRONPCTSAT 7 (L) 02/16/2021 1042     08/25/2019, platelet count  491, WBC 7.5, hemoglobin 12 Creatinine 1.58, EGFR 37, calcium 10.8, albumin 4.2 Negative hepatitis B surface antigen, hepatitis B core antibody, Negative hepatitis C 08/05/2019, A1c 11.2   RADIOGRAPHIC STUDIES: I have personally reviewed the radiological images as listed and agreed with the findings in the report. No results found.

## 2022-05-11 NOTE — Assessment & Plan Note (Signed)
Cystic pancreatic cyst, MRI findings were reviewed and discussed with patient.Possible pseudocyst, less likely cystic pancreatic neoplasm.  Given her poor prognosis from amyloidosis, recommend observation.

## 2022-05-11 NOTE — Assessment & Plan Note (Addendum)
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  Bilirubin level decreased PT/INR PTT normal. Observation.

## 2022-05-11 NOTE — Assessment & Plan Note (Signed)
Hemoglobin is above 10, stable.

## 2022-05-11 NOTE — Assessment & Plan Note (Signed)
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  Continue observation. Recommend patient to continue follow-up with palliative care service. She follows up with me in [place] alternatively.

## 2022-05-12 LAB — BETA 2 MICROGLOBULIN, SERUM: Beta-2 Microglobulin: 4.4 mg/L — ABNORMAL HIGH (ref 0.6–2.4)

## 2022-05-14 LAB — KAPPA/LAMBDA LIGHT CHAINS
Kappa free light chain: 64.7 mg/L — ABNORMAL HIGH (ref 3.3–19.4)
Kappa, lambda light chain ratio: 2.45 — ABNORMAL HIGH (ref 0.26–1.65)
Lambda free light chains: 26.4 mg/L — ABNORMAL HIGH (ref 5.7–26.3)

## 2022-05-16 LAB — MULTIPLE MYELOMA PANEL, SERUM
Albumin SerPl Elph-Mcnc: 3.1 g/dL (ref 2.9–4.4)
Albumin/Glob SerPl: 1.1 (ref 0.7–1.7)
Alpha 1: 0.3 g/dL (ref 0.0–0.4)
Alpha2 Glob SerPl Elph-Mcnc: 0.6 g/dL (ref 0.4–1.0)
B-Globulin SerPl Elph-Mcnc: 1.2 g/dL (ref 0.7–1.3)
Gamma Glob SerPl Elph-Mcnc: 0.8 g/dL (ref 0.4–1.8)
Globulin, Total: 3 g/dL (ref 2.2–3.9)
IgA: 295 mg/dL (ref 64–422)
IgG (Immunoglobin G), Serum: 1106 mg/dL (ref 586–1602)
IgM (Immunoglobulin M), Srm: 47 mg/dL (ref 26–217)
Total Protein ELP: 6.1 g/dL (ref 6.0–8.5)

## 2022-05-17 ENCOUNTER — Other Ambulatory Visit: Payer: Self-pay | Admitting: Oncology

## 2022-05-23 ENCOUNTER — Telehealth: Payer: Self-pay

## 2022-05-23 ENCOUNTER — Other Ambulatory Visit: Payer: Self-pay | Admitting: Oncology

## 2022-05-23 DIAGNOSIS — E859 Amyloidosis, unspecified: Secondary | ICD-10-CM

## 2022-05-23 NOTE — Progress Notes (Signed)
DISCONTINUE ON PATHWAY REGIMEN - Multiple Myeloma and Other Plasma Cell Dyscrasias     Cycles 1 and 2: A cycle is every 28 days:     Cyclophosphamide      Dexamethasone      Daratumumab and hyaluronidase-fihj      Bortezomib    Cycles 3 through 6: A cycle is every 28 days:     Cyclophosphamide      Dexamethasone      Dexamethasone      Daratumumab and hyaluronidase-fihj      Bortezomib    Cycles 7 and beyond (up to 2 years): A cycle is every 28 days:     Daratumumab and hyaluronidase-fihj   **Always confirm dose/schedule in your pharmacy ordering system**  REASON: Disease Progression PRIOR TREATMENT: [id number]: DaraCyBord (Daratumumab/hyaluronidase SUBQ + Cyclophosphamide PO + Bortezomib SUBQ + Dexamethasone PO/IV) q28 Days for up to 6 Cycles Followed by Daratumumab/hyaluronidase SUBQ D1 q28 Days for up to 2 Years TREATMENT RESPONSE: Progressive Disease (PD)  START OFF PATHWAY REGIMEN - Multiple Myeloma and Other Plasma Cell Dyscrasias   [id number]:Daratumumab SUBQ q28 Days:   Cycles 1 and 2: A cycle is every 28 days:     Daratumumab and hyaluronidase-fihj    Cycles 3 through 6: A cycle is every 28 days:     Daratumumab and hyaluronidase-fihj    Cycles 7 and beyond: A cycle is every 28 days:     Daratumumab and hyaluronidase-fihj   **Always confirm dose/schedule in your pharmacy ordering system**  Patient Characteristics: Primary AL Amyloidosis, Second Line and Beyond Disease Classification: Primary AL Amyloidosis Line of therapy: Second Line and Beyond Intent of Therapy: Non-Curative / Palliative Intent, Discussed with Patient

## 2022-05-23 NOTE — Telephone Encounter (Signed)
-----   Message from [name], MD sent at 05/23/2022 11:33 AM EST ----- Please schedule patient for lab MD [place] in next week. Dr.Kang recommends to go back on Dara.

## 2022-05-23 NOTE — Telephone Encounter (Signed)
Called and spoked to pt and daughter Nancy Rodriguez, to inform them of Dr. Mertha Finders recommendation to restart tx. They verbalized understanding.   Please contact pt/ Nancy Rodriguez with appt details once request is in IS. (Should be sometime next week)

## 2022-05-30 ENCOUNTER — Other Ambulatory Visit: Payer: Self-pay | Admitting: Oncology

## 2022-05-30 ENCOUNTER — Inpatient Hospital Stay: Payer: Medicare Other | Admitting: Oncology

## 2022-05-30 ENCOUNTER — Inpatient Hospital Stay: Payer: Medicare Other

## 2022-05-30 ENCOUNTER — Telehealth: Payer: Self-pay | Admitting: *Deleted

## 2022-05-30 DIAGNOSIS — E859 Amyloidosis, unspecified: Secondary | ICD-10-CM

## 2022-05-30 NOTE — Telephone Encounter (Signed)
IS has been updated to next week.

## 2022-05-30 NOTE — Assessment & Plan Note (Deleted)
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  Discussed with [facility] Dr.Kang, will resume patient on Daratumumab.

## 2022-05-30 NOTE — Telephone Encounter (Signed)
Patient called to cancel today's appointment she said she would reschedule at another time.

## 2022-06-06 ENCOUNTER — Inpatient Hospital Stay: Payer: Medicare Other

## 2022-06-06 ENCOUNTER — Inpatient Hospital Stay: Payer: Medicare Other | Attending: Oncology

## 2022-06-06 ENCOUNTER — Inpatient Hospital Stay (HOSPITAL_BASED_OUTPATIENT_CLINIC_OR_DEPARTMENT_OTHER): Payer: Medicare Other | Admitting: Oncology

## 2022-06-06 ENCOUNTER — Other Ambulatory Visit: Payer: Self-pay

## 2022-06-06 ENCOUNTER — Encounter: Payer: Self-pay | Admitting: Oncology

## 2022-06-06 VITALS — BP 140/68 | HR 70 | Temp 97.4°F | Wt 133.2 lb

## 2022-06-06 VITALS — BP 137/73 | HR 72 | Temp 98.0°F | Resp 17

## 2022-06-06 DIAGNOSIS — Z79899 Other long term (current) drug therapy: Secondary | ICD-10-CM | POA: Diagnosis not present

## 2022-06-06 DIAGNOSIS — D631 Anemia in chronic kidney disease: Secondary | ICD-10-CM | POA: Insufficient documentation

## 2022-06-06 DIAGNOSIS — R188 Other ascites: Secondary | ICD-10-CM

## 2022-06-06 DIAGNOSIS — E854 Organ-limited amyloidosis: Secondary | ICD-10-CM | POA: Diagnosis not present

## 2022-06-06 DIAGNOSIS — I130 Hypertensive heart and chronic kidney disease with heart failure and stage 1 through stage 4 chronic kidney disease, or unspecified chronic kidney disease: Secondary | ICD-10-CM | POA: Diagnosis not present

## 2022-06-06 DIAGNOSIS — E8581 Light chain (AL) amyloidosis: Secondary | ICD-10-CM | POA: Insufficient documentation

## 2022-06-06 DIAGNOSIS — N1831 Chronic kidney disease, stage 3a: Secondary | ICD-10-CM | POA: Diagnosis not present

## 2022-06-06 DIAGNOSIS — E859 Amyloidosis, unspecified: Secondary | ICD-10-CM

## 2022-06-06 DIAGNOSIS — E1122 Type 2 diabetes mellitus with diabetic chronic kidney disease: Secondary | ICD-10-CM | POA: Diagnosis not present

## 2022-06-06 DIAGNOSIS — K77 Liver disorders in diseases classified elsewhere: Secondary | ICD-10-CM

## 2022-06-06 DIAGNOSIS — Z5111 Encounter for antineoplastic chemotherapy: Secondary | ICD-10-CM

## 2022-06-06 DIAGNOSIS — K766 Portal hypertension: Secondary | ICD-10-CM | POA: Insufficient documentation

## 2022-06-06 LAB — CBC WITH DIFFERENTIAL/PLATELET
Abs Immature Granulocytes: 0.17 10*3/uL — ABNORMAL HIGH (ref 0.00–0.07)
Basophils Absolute: 0.1 10*3/uL (ref 0.0–0.1)
Basophils Relative: 2 %
Eosinophils Absolute: 0.2 10*3/uL (ref 0.0–0.5)
Eosinophils Relative: 5 %
HCT: 29 % — ABNORMAL LOW (ref 36.0–46.0)
Hemoglobin: 10 g/dL — ABNORMAL LOW (ref 12.0–15.0)
Immature Granulocytes: 4 %
Lymphocytes Relative: 32 %
Lymphs Abs: 1.3 10*3/uL (ref 0.7–4.0)
MCH: 26.8 pg (ref 26.0–34.0)
MCHC: 34.5 g/dL (ref 30.0–36.0)
MCV: 77.7 fL — ABNORMAL LOW (ref 80.0–100.0)
Monocytes Absolute: 0.7 10*3/uL (ref 0.1–1.0)
Monocytes Relative: 17 %
Neutro Abs: 1.7 10*3/uL (ref 1.7–7.7)
Neutrophils Relative %: 40 %
Platelets: 415 10*3/uL — ABNORMAL HIGH (ref 150–400)
RBC: 3.73 MIL/uL — ABNORMAL LOW (ref 3.87–5.11)
RDW: 25.6 % — ABNORMAL HIGH (ref 11.5–15.5)
WBC: 4.1 10*3/uL (ref 4.0–10.5)
nRBC: 0 % (ref 0.0–0.2)

## 2022-06-06 LAB — COMPREHENSIVE METABOLIC PANEL
ALT: 24 U/L (ref 0–44)
AST: 39 U/L (ref 15–41)
Albumin: 2.8 g/dL — ABNORMAL LOW (ref 3.5–5.0)
Alkaline Phosphatase: 419 U/L — ABNORMAL HIGH (ref 38–126)
Anion gap: 10 (ref 5–15)
BUN: 34 mg/dL — ABNORMAL HIGH (ref 8–23)
CO2: 22 mmol/L (ref 22–32)
Calcium: 8.7 mg/dL — ABNORMAL LOW (ref 8.9–10.3)
Chloride: 108 mmol/L (ref 98–111)
Creatinine, Ser: 1.43 mg/dL — ABNORMAL HIGH (ref 0.44–1.00)
GFR, Estimated: 38 mL/min — ABNORMAL LOW (ref >60)
Glucose, Bld: 212 mg/dL — ABNORMAL HIGH (ref 70–99)
Potassium: 3.2 mmol/L — ABNORMAL LOW (ref 3.5–5.1)
Sodium: 140 mmol/L (ref 135–145)
Total Bilirubin: 2.4 mg/dL — ABNORMAL HIGH (ref 0.3–1.2)
Total Protein: 6.4 g/dL — ABNORMAL LOW (ref 6.5–8.1)

## 2022-06-06 LAB — TYPE AND SCREEN
ABO/RH(D): A POS
Antibody Screen: NEGATIVE

## 2022-06-06 MED ORDER — DEXAMETHASONE 4 MG PO TABS
20.0000 mg | ORAL_TABLET | Freq: Once | ORAL | Status: AC
  Administered 2022-06-06: 20 mg via ORAL
  Filled 2022-06-06: qty 5

## 2022-06-06 MED ORDER — ACETAMINOPHEN 325 MG PO TABS
650.0000 mg | ORAL_TABLET | Freq: Once | ORAL | Status: DC
  Filled 2022-06-06: qty 2

## 2022-06-06 MED ORDER — DARATUMUMAB-HYALURONIDASE-FIHJ 1800-30000 MG-UT/15ML ~~LOC~~ SOLN
1800.0000 mg | Freq: Once | SUBCUTANEOUS | Status: AC
  Administered 2022-06-06: 1800 mg via SUBCUTANEOUS
  Filled 2022-06-06: qty 15

## 2022-06-06 MED ORDER — DIPHENHYDRAMINE HCL 25 MG PO CAPS
50.0000 mg | ORAL_CAPSULE | Freq: Once | ORAL | Status: AC
  Administered 2022-06-06: 50 mg via ORAL
  Filled 2022-06-06: qty 2

## 2022-06-06 MED ORDER — MONTELUKAST SODIUM 10 MG PO TABS
10.0000 mg | ORAL_TABLET | Freq: Once | ORAL | Status: AC
  Administered 2022-06-06: 10 mg via ORAL
  Filled 2022-06-06: qty 1

## 2022-06-06 NOTE — Assessment & Plan Note (Signed)
Patient declines paracentesis due to currently she is asymptomatic.  Continue observation

## 2022-06-06 NOTE — Assessment & Plan Note (Signed)
Hemoglobin is stable. Monitor

## 2022-06-06 NOTE — Progress Notes (Signed)
1007: Pt states that she would prefer not to take the Tylenol due to the effect on her kidneys. Per Dr. Tasia Catchings okay to hold Tylenol per pt request and proceed with Darzalex Faspro.   1325: Pt and VS stable at discharge, no s/s of reaction or distress noted. Injection site WNL.

## 2022-06-06 NOTE — Assessment & Plan Note (Signed)
Treatment plan as listed above

## 2022-06-06 NOTE — Patient Instructions (Addendum)
[facility]  Discharge Instructions: Thank you for choosing [facility] to provide your oncology and hematology care.  If you have a lab appointment with the [facility], please go directly to the [facility] and check in at the registration area.  Wear comfortable clothing and clothing appropriate for easy access to any Portacath or PICC line.   We strive to give you quality time with your provider. You may need to reschedule your appointment if you arrive late (15 or more minutes).  Arriving late affects you and other patients whose appointments are after yours.  Also, if you miss three or more appointments without notifying the office, you may be dismissed from the clinic at the provider's discretion.      For prescription refill requests, have your pharmacy contact our office and allow 72 hours for refills to be completed.    Today you received the following chemotherapy and/or immunotherapy agents Darzalex Faspro      To help prevent nausea and vomiting after your treatment, we encourage you to take your nausea medication as directed.  BELOW ARE SYMPTOMS THAT SHOULD BE REPORTED IMMEDIATELY: *FEVER GREATER THAN 100.4 F (38 C) OR HIGHER *CHILLS OR SWEATING *NAUSEA AND VOMITING THAT IS NOT CONTROLLED WITH YOUR NAUSEA MEDICATION *UNUSUAL SHORTNESS OF BREATH *UNUSUAL BRUISING OR BLEEDING *URINARY PROBLEMS (pain or burning when urinating, or frequent urination) *BOWEL PROBLEMS (unusual diarrhea, constipation, pain near the anus) TENDERNESS IN MOUTH AND THROAT WITH OR WITHOUT PRESENCE OF ULCERS (sore throat, sores in mouth, or a toothache) UNUSUAL RASH, SWELLING OR PAIN  UNUSUAL VAGINAL DISCHARGE OR ITCHING   Items with * indicate a potential emergency and should be followed up as soon as possible or go to the Emergency Department if any problems should occur.  Please show the CHEMOTHERAPY ALERT CARD or IMMUNOTHERAPY ALERT CARD at  check-in to the Emergency Department and triage nurse.  Should you have questions after your visit or need to cancel or reschedule your appointment, please contact [facility]  [phone] and follow the prompts.  Office hours are 8:00 a.m. to 4:30 p.m. Monday - Friday. Please note that voicemails left after 4:00 p.m. may not be returned until the following business day.  We are closed weekends and major holidays. You have access to a nurse at all times for urgent questions. Please call the main number to the clinic [phone] and follow the prompts.  For any non-urgent questions, you may also contact your provider using MyChart. We now offer e-Visits for anyone 25 and older to request care online for non-urgent symptoms. For details visit [email or web address].   Also download the MyChart app! Go to the app store, search "MyChart", open the app, select [facility], and log in with your MyChart username and password.   Daratumumab; Hyaluronidase Injection What is this medication? DARATUMUMAB; HYALURONIDASE (dar a toom ue mab; hye al ur ON i dase) treats multiple myeloma, a type of bone marrow cancer. Daratumumab works by blocking a protein that causes cancer cells to grow and multiply. This helps to slow or stop the spread of cancer cells. Hyaluronidase works by increasing the absorption of other medications in the body to help them work better. This medication may also be used treat amyloidosis, a condition that causes the buildup of a protein (amyloid) in your body. It works by reducing the buildup of this protein, which decreases symptoms. It is a combination medication that contains a  monoclonal antibody. This medicine may be used for other purposes; ask your health care provider or pharmacist if you have questions. COMMON BRAND NAME(S): DARZALEX FASPRO What should I tell my care team before I take this medication? They need to know if you have any of these  conditions: Heart disease Infection, such as chickenpox, cold sores, herpes, hepatitis B Lung or breathing disease An unusual or allergic reaction to daratumumab, hyaluronidase, other medications, foods, dyes, or preservatives Pregnant or trying to get pregnant Breast-feeding How should I use this medication? This medication is injected under the skin. It is given by your care team in a hospital or clinic setting. Talk to your care team about the use of this medication in children. Special care may be needed. Overdosage: If you think you have taken too much of this medicine contact a poison control center or emergency room at once. NOTE: This medicine is only for you. Do not share this medicine with others. What if I miss a dose? Keep appointments for follow-up doses. It is important not to miss your dose. Call your care team if you are unable to keep an appointment. What may interact with this medication? Interactions have not been studied. This list may not describe all possible interactions. Give your health care provider a list of all the medicines, herbs, non-prescription drugs, or dietary supplements you use. Also tell them if you smoke, drink alcohol, or use illegal drugs. Some items may interact with your medicine. What should I watch for while using this medication? Your condition will be monitored carefully while you are receiving this medication. This medication can cause serious allergic reactions. To reduce your risk, your care team may give you other medication to take before receiving this one. Be sure to follow the directions from your care team. This medication can affect the results of blood tests to match your blood type. These changes can last for up to 6 months after the final dose. Your care team will do blood tests to match your blood type before you start treatment. Tell all of your care team that you are being treated with this medication before receiving a blood  transfusion. This medication can affect the results of some tests used to determine treatment response; extra tests may be needed to evaluate response. Talk to your care team if you wish to become pregnant or think you are pregnant. This medication can cause serious birth defects if taken during pregnancy and for 3 months after the last dose. A reliable form of contraception is recommended while taking this medication and for 3 months after the last dose. Talk to your care team about effective forms of contraception. Do not breast-feed while taking this medication. What side effects may I notice from receiving this medication? Side effects that you should report to your care team as soon as possible: Allergic reactions--skin rash, itching, hives, swelling of the face, lips, tongue, or throat Heart rhythm changes--fast or irregular heartbeat, dizziness, feeling faint or lightheaded, chest pain, trouble breathing Infection--fever, chills, cough, sore throat, wounds that don't heal, pain or trouble when passing urine, general feeling of discomfort or being unwell Infusion reactions--chest pain, shortness of breath or trouble breathing, feeling faint or lightheaded Sudden eye pain or change in vision such as blurry vision, seeing halos around lights, vision loss Unusual bruising or bleeding Side effects that usually do not require medical attention (report to your care team if they continue or are bothersome): Constipation Diarrhea Fatigue Nausea Pain, tingling,  or numbness in the hands or feet Swelling of the ankles, hands, or feet This list may not describe all possible side effects. Call your doctor for medical advice about side effects. You may report side effects to FDA at 1-800-FDA-1088. Where should I keep my medication? This medication is given in a hospital or clinic. It will not be stored at home. NOTE: This sheet is a summary. It may not cover all possible information. If you have  questions about this medicine, talk to your doctor, pharmacist, or health care provider.  2023 [employer]vier/Gold Standard (2021-07-26 00:00:00)

## 2022-06-06 NOTE — Assessment & Plan Note (Signed)
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 Continue Furosemide, Spirolactone, follow up with Hepatology Dr. Dorris Fetch.

## 2022-06-06 NOTE — Progress Notes (Signed)
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  Discussed with [facility] Dr.Kang, will resume patient on Daratumumab.  Recommendation was discussed with patient and her daughter today. They agree with the plan Labs are reviewed and discussed with patient. 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 Continue Furosemide, Spirolactone, follow up with Hepatology Dr. Dorris Fetch.   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HCC) Hemoglobin is stable. Monitor  Ascites Patient declines paracentesis due to currently she is asymptomatic.  Continue observation  Encounter for antineoplastic chemotherapy Treatment plan as listed above   Orders Placed This Encounter  Procedures   Comprehensive metabolic panel    Standing Status:   Future    Standing Expiration Date:   07/06/2023   CBC with Differential    Standing Status:   Future    Standing Expiration Date:   07/06/2023   CBC with Differential    Standing Status:   Future    Standing Expiration Date:   07/13/2023   Comprehensive metabolic panel    Standing Status:   Future    Standing Expiration Date:   07/20/2023   CBC with Differential    Standing Status:   Future    Standing Expiration Date:   07/20/2023   CBC with Differential    Standing Status:   Future    Standing Expiration Date:   07/27/2023   Kappa/lambda light chains    Standing Status:   Future    Standing Expiration Date:   06/15/2023   Multiple Myeloma Panel (SPEP&IFE w/QIG)    Standing Status:   Future    Standing  Expiration Date:   07/06/2023   Kappa/lambda light chains    Standing Status:   Future    Standing Expiration Date:   07/06/2023   Beta 2 microglobulin, serum    Standing Status:   Future    Standing Expiration Date:   07/06/2023   Follow up  per LOS  All questions were answered. The patient knows to call the clinic with any problems, questions or concerns.  [name], MD, PhD [facility] Hematology Oncology 06/06/2022   HISTORY OF PRESENTING ILLNESS:   Nancy Rodriguez is a  77 y.o.  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  She was seen by Hematology Oncology at [facility] on 02/14/2017.  07/25/2007  SPEP showed M protein of 0.35, IFE showed IgG lamda 09/22/2007  Bone survey negative.  02/15/14 IgG 930, SPEP M protien 0.22, free kappa light chain 3.59, lamda 2.92, ratio 1.23  Hypercalcemia, resolved after stopping HCTZ.  # Transaminitis 03/28/20 US abdomen showed left liver lobe Complex 1.7 cm cyst  # 04/07/20 MRI abdomen w/wo contrast showed 2 benign liver cysts, 2 nonspecific hypovascular irighr liver lesions, abnormal bone marrow signal of L1 vertebral body. Patient was  advised to proceed with bone marrow biopsy and she declined.  # referred her to established care with GI and was seen on  04/19/20,  # Liver biopsy showed amyloidosis. [place] MS/MS showed AL type # 05/20/20 recommend bone marrow biopsy which is scheduled on 05/24/2020.  Patient changed her mind and ask a biopsy to be canceled.  My team and I had multiple phone discussion with patient's daughter Nancy Rodriguez and later with the patient's son Nancy Rodriguez.  Patient agreed with bone marrow biopsy and a biopsy was scheduled on 06/09/2020.  05/20/20 NT proBNP  was normal,  TSH slightly increased, normal T4 Normal factor X Normal coags Troponin 19.  Refer to cardiology for evaluation. May need cardiac MRI  # 06/01/2020 2D echo result was reviewed and discussed with patient.  Patient has normal LVEF 60-65%. Mild asymmetric left ventricular hypertrophy.  Left ventricular diastolic parameters are indeterminate. average left ventricular global longitudinal strain is -16.8 %.  # 06/09/2020, bone marrow biopsy showed monoclonal plasmacytosis, 8%, amyloid deposit present.  Absent iron stores. 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 t (14;20) #06/17/2020, further discussed about diagnosis and treatment plan.  Patient declined bone marrow transplant evaluation.  Decision was made to proceed with Dara-CyborD chemotherapy treatments. #May 2022, second opinion at [facility] with Dr. Tracey Harries.who agrees with current treatment plan.  He recommends to lower dexamethasone to 20 mg and decrease premed Tylenol to 325 mg  09/01/2020 cardiac MRI was performed at [facility].  Left ventricle is normal in size.  Mild to moderate basal septal hypertrophy.  Hyper dynamic LV function.  LVEF 71%.  Right ventricle is normal in size and wall thickness.  Systolic function normal.  Mild bi atrial enlargement, no significant aortic valve stenosis or regurgitation.  Trivial mitral regurgitation, mild tricuspid regurgitation and a trivial pulmonic regurgitation.  No evidence of MI, scarring or infiltration.  #History of major depression listed in outside problem list.  previous diagnosis and treatment details are not available to me in EMR  I have referred her to psychiatrist  #  vaginal bleeding which stopped- she was seen by GYN and recommended for biopsy.   # She has poor IV access and was not able to receive IV Emend and IV Aloxi. She does not want to have med port.   # GERD   Dr. Ola Spurr has increased her omeprazole to 40 mg daily. She feels symptoms are improved.  # 10/08/2020, cervical spine showed cervical spondylosis.  Spinal canal  diagnosis and a posterior disc osteophyte complex and superimposed small central disc protrusion contact the ventral spinal cord at C4-C5.  Multilevel neuroforaminal narrowing. bilateral atlantooccipital joint effusions, multilevel grade 1 spondylolisthesis. MRI lumbar spine without contrast showed acute or subacute fracture of L4.   Moderate spinal stenosis at L3-4 has progressed. Shallow left foraminal disc protrusion has progressed. Moderate subarticular stenosis on the left also with progression  Moderate spinal stenosis L4-5 with progression. Moderate subarticular stenosis on the left with progression  # 10/25/2020 seen by [place] who recommend patient to switch Dara CyBorD to Dara RD  # 11/03/2020 Revlimid (lenalidomide) 10 mg by mouth daily for 14 days, then hold for 7 days. Pt was started on a shortened cycle in order to remain on similar schedule with infusion.   # 11/22/2020 seen by hepatologist Dr.Kappus [place]. Recommended to start Zoloft 56m daily for pruritis # 11/24/2020 cycle 2 Revlimid 10 mg, she finished 14 days.  Rest of the course was held due to anemia.  Patient declined blood transfusion.  # Vaginal bleeding  patient was seen by Dr. BLeafy Ro  on megace 20m daily. 12/22/2020 repeat EMBx - not diagnostic due to insufficient squamous component.   03/03/2021, ultrasound abdomen complete showed possible hypoechoic mass in the pancreatic tail.  1.8 x 1.2 x 1.4 cm.  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  Cholelithiasis.  MRI has been ordered for further evaluation and is scheduled.  + [facility]  Patient was found to have marked hyperglycemia, AKI, UTI. 05/03/2021, CT head showed chronic appearing subdural hematoma, measuring up to 1.6 on the left and 0.9 on the right. 05/18/2021, patient was seen by cardiology Dr.End, patient was recommended to continue torsemide 20 mg daily as needed for edema.  Patient was recommended to  discontinue aspirin 81 mg due to frequent falls and chronic subdural hematoma.  Patient has not had further falls.  She has home physical therapy  + Chronic diarrhea, GI Dr.Kappus recommended endoscopy evaluation and she declined.  INTERVAL HISTORY SSHANNAY DINGERis a 77y.o. female who has above history reviewed by me today presents for follow up visit for management of AL amyloidosis Patient was accompanied by her daughter today.  Last visit was 09/18/2021. Patient follows up with [facility] oncology and hepatology. Denies any abdominal pressure or pain.  Denies fever, nausea, vomiting. Appetite is fair. No new complaints.    Review of Systems  Constitutional:  Positive for fatigue. Negative for appetite change, chills, fever and unexpected weight change.  HENT:   Negative for hearing loss, nosebleeds and voice change.   Eyes:  Negative for eye problems and icterus.  Respiratory:  Negative for chest tightness and cough.   Cardiovascular:  Negative for chest pain and leg swelling.  Gastrointestinal:  Positive for abdominal distention. Negative for abdominal pain, blood in stool and nausea.  Endocrine: Negative for hot flashes.  Genitourinary:  Negative for difficulty urinating and frequency.   Musculoskeletal:  Positive for back pain. Negative for arthralgias.  Skin:  Negative for itching and rash.  Neurological:  Positive for numbness. Negative for extremity weakness.  Hematological:  Negative for adenopathy.  Psychiatric/Behavioral:  Negative for confusion.     MEDICAL HISTORY:  Past Medical History:  Diagnosis Date   (HFpEF) heart failure with preserved ejection fraction ([place]    a. 05/2020 Echo: EF 60-65%; b. 08/2020 cMRI ([facility]): EF 71%, no delayed hyperenhancement to suggest scar/infiltrative dzs. Nl RV fxn. Mild BAE. Triv MR, mild TR; c. 06/2021 Echo: EF 50-55%, no rwma, mild basal-septal LVH, GrII DD. Nl RV size/fxn. Mild MR. Ao sclerosis.   AL amyloidosis ([place]    Anemia due to  chronic kidney disease    Anemia in chronic kidney disease   CKD (chronic kidney disease), stage III (HCC)    Diabetes mellitus without complication ([place]    History of cardiovascular stress test    a. 03/2012 Stress Echo([facility]): nl study.   Hypercholesterolemia    Hypertension    MGUS (monoclonal gammopathy of unknown significance)    Osteoarthritis    Rheumatoid arthritis ([place]     SURGICAL HISTORY: Past Surgical History:  Procedure Laterality Date   HERNIA REPAIR     INTRAMEDULLARY (IM) NAIL INTERTROCHANTERIC Right 09/26/2021   Procedure: INTRAMEDULLARY (IM) NAIL INTERTROCHANTRIC;  Surgeon: KThornton Park MD;  Location: [facility] ORS;  Service: Orthopedics;  Laterality: Right;   PARATHYROIDECTOMY      SOCIAL HISTORY: Social History   Socioeconomic History   Marital status: Widowed    Spouse name: Not on file   Number of children:  6   Years of education: Not on file   Highest education level: Not on file  Occupational History   Not on file  Tobacco Use   Smoking status: Never   Smokeless tobacco: Never  Vaping Use   Vaping Use: Never used  Substance and Sexual Activity   Alcohol use: No   Drug use: Never   Sexual activity: Not on file  Other Topics Concern   Not on file  Social History Narrative   Not on file   Social Determinants of Health   Financial Resource Strain: Not on file  Food Insecurity: Not on file  Transportation Needs: Not on file  Physical Activity: Not on file  Stress: Not on file  Social Connections: Not on file  Intimate Partner Violence: Not on file    FAMILY HISTORY: Family History  Problem Relation Age of Onset   Diabetes Daughter    Diabetes Son    Dementia Mother    Cancer Father    Esophageal cancer Sister    Brain cancer Brother     ALLERGIES:  is allergic to benazepril, lisinopril, tolmetin, and nsaids.  MEDICATIONS:  Current Outpatient Medications  Medication Sig Dispense Refill   acyclovir (ZOVIRAX) 400 MG tablet Take  1 tablet (400 mg total) by mouth 2 (two) times daily. 60 tablet 2   albuterol (VENTOLIN HFA) 108 (90 Base) MCG/ACT inhaler Inhale 2 puffs into the lungs every 4 (four) hours as needed for wheezing or shortness of breath.     amLODipine (NORVASC) 5 MG tablet Take 5 mg by mouth daily.     EPINEPHrine 0.3 mg/0.3 mL IJ SOAJ injection Inject 0.3 mg into the muscle as needed.     gabapentin (NEURONTIN) 100 MG capsule Take 100 mg by mouth at bedtime.     lactulose (CHRONULAC) 10 GM/15ML solution Take 30 g by mouth 2 (two) times daily.     loperamide (IMODIUM) 2 MG capsule Take 1 capsule (2 mg total) by mouth See admin instructions. Initial: 4 mg, followed by 2 mg after each loose stool, maximum 16 mg within 24 hours. 60 capsule 0   metoprolol succinate (TOPROL-XL) 50 MG 24 hr tablet Take 50 mg by mouth daily.     omeprazole (PRILOSEC) 40 MG capsule Take 40 mg by mouth 2 (two) times daily.     polyethylene glycol (MIRALAX / GLYCOLAX) 17 g packet Take 17 g by mouth daily as needed for mild constipation. 14 each 0   sertraline (ZOLOFT) 25 MG tablet Take 50 mg by mouth daily.     spironolactone (ALDACTONE) 100 MG tablet Take 200 mg by mouth daily.     torsemide (DEMADEX) 20 MG tablet Take 20 mg by mouth daily as needed.     traMADol (ULTRAM) 50 MG tablet Take 1 tablet (50 mg total) by mouth every 12 (twelve) hours as needed for moderate pain. 10 tablet 0   KLOR-CON M20 20 MEQ tablet TAKE 1 TABLET BY MOUTH TWICE A DAY (Patient not taking: Reported on 06/06/2022) 60 tablet 1   torsemide (DEMADEX) 20 MG tablet Take 40 mg by mouth daily. (Patient not taking: Reported on 06/06/2022)     No current facility-administered medications for this visit.   Facility-Administered Medications Ordered in Other Visits  Medication Dose Route Frequency Provider Last Rate Last Admin   daratumumab-hyaluronidase-fihj (DARZALEX FASPRO) 1800-30000 MG-UT/15ML chemo SQ injection 1,800 mg  1,800 mg Subcutaneous Once [name], MD  PHYSICAL EXAMINATION: ECOG PERFORMANCE STATUS: 2 - Symptomatic, <50% confined to bed Vitals:   06/06/22 0902  BP: (!) 140/68  Pulse: 70  Temp: (!) 97.4 F (36.3 C)  SpO2: 100%   Filed Weights   06/06/22 0902  Weight: 133 lb 3.2 oz (60.4 kg)    Physical Exam Constitutional:      General: She is not in acute distress.    Appearance: She is ill-appearing.  HENT:     Head: Normocephalic and atraumatic.  Eyes:     General: No scleral icterus. Cardiovascular:     Rate and Rhythm: Normal rate and regular rhythm.     Heart sounds: Murmur heard.  Pulmonary:     Effort: Pulmonary effort is normal. No respiratory distress.     Breath sounds: No wheezing.  Abdominal:     General: Bowel sounds are normal. There is distension.     Palpations: Abdomen is soft.  Musculoskeletal:        General: No deformity. Normal range of motion.     Cervical back: Normal range of motion and neck supple.     Comments: Bilateral lower extremities trace edema  Skin:    General: Skin is warm and dry.     Findings: No erythema or rash.     Comments: Several subcentimeter subcutaneous firm nodules on abdomen wall  Neurological:     Mental Status: She is alert. Mental status is at baseline.     Cranial Nerves: No cranial nerve deficit.     Coordination: Coordination normal.  Psychiatric:        Mood and Affect: Mood normal.     LABORATORY DATA:  I have reviewed the data as listed    Latest Ref Rng & Units 06/06/2022    8:41 AM 05/11/2022    9:59 AM 10/24/2021    4:43 AM  CBC  WBC 4.0 - 10.5 K/uL 4.1  4.8  7.2   Hemoglobin 12.0 - 15.0 g/dL 10.0  10.5  8.5   Hematocrit 36.0 - 46.0 % 29.0  29.9  24.1   Platelets 150 - 400 K/uL 415  458  341       Latest Ref Rng & Units 06/06/2022    8:41 AM 05/11/2022    9:59 AM 10/24/2021    4:43 AM  CMP  Glucose 70 - 99 mg/dL 212  150  108   BUN 8 - 23 mg/dL 34  34  17   Creatinine 0.44 - 1.00 mg/dL 1.43  1.35  0.83   Sodium 135 - 145 mmol/L 140   140  137   Potassium 3.5 - 5.1 mmol/L 3.2  3.2  3.7   Chloride 98 - 111 mmol/L 108  104  106   CO2 22 - 32 mmol/L 22  24  25   $ Calcium 8.9 - 10.3 mg/dL 8.7  8.8  8.3   Total Protein 6.5 - 8.1 g/dL 6.4  6.6  4.6   Total Bilirubin 0.3 - 1.2 mg/dL 2.4  3.3  10.0   Alkaline Phos 38 - 126 U/L 419  459  691   AST 15 - 41 U/L 39  43  41   ALT 0 - 44 U/L 24  23  36      Iron/TIBC/Ferritin/ %Sat    Component Value Date/Time   IRON 23 (L) 02/16/2021 1042   TIBC 316 02/16/2021 1042   FERRITIN 28 02/16/2021 1042   IRONPCTSAT 7 (L) 02/16/2021 1042  08/25/2019, platelet count 491, WBC 7.5, hemoglobin 12 Creatinine 1.58, EGFR 37, calcium 10.8, albumin 4.2 Negative hepatitis B surface antigen, hepatitis B core antibody, Negative hepatitis C 08/05/2019, A1c 11.2   RADIOGRAPHIC STUDIES: I have personally reviewed the radiological images as listed and agreed with the findings in the report. No results found.

## 2022-06-06 NOTE — Assessment & Plan Note (Addendum)
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  Discussed with [facility] Dr.Kang, will resume patient on Daratumumab.  Recommendation was discussed with patient and her daughter today. They agree with the plan Labs are reviewed and discussed with patient. Proceed with Daratumumab treatment She will return in 1 week and proceed with D8 [facility]

## 2022-06-07 ENCOUNTER — Other Ambulatory Visit: Payer: Medicare Other

## 2022-06-07 VITALS — BP 160/72 | HR 73 | Temp 97.4°F | Wt 129.0 lb

## 2022-06-07 DIAGNOSIS — Z515 Encounter for palliative care: Secondary | ICD-10-CM

## 2022-06-07 NOTE — Progress Notes (Signed)
COMMUNITY PALLIATIVE CARE SW NOTE  PATIENT NAME: Nancy Rodriguez DOB: [birth year] MRN: [id number]  PRIMARY CARE PROVIDER: Leonel Ramsay, MD  RESPONSIBLE PARTY:  Acct ID - Guarantor Home Phone Work Phone Relationship Acct Type  [id number] Nancy [phone]  Self P/F     [address], [place], [place] [address]     PLAN OF CARE and INTERVENTIONS:              Palliative care encounter: Follow up PC visit completed with [facility] RN, Nancy Rodriguez, patient and patients daughter.  Cancer: patient shared that she has restarted chemo as of yesterday. No negative side effects at this time, other than increased appetite.   Support: patient currently has [facility] home health in place providing. PT and RN. Patient and daughter inquired about aide services. Advised that they inquire with [facility] team when they visit again, as they will need to obtain orders for an aide from PCP.  Resources: SW provided patient and daughter with [facility] contact info again for respite support and discussed [employer] application that SW mailed to them after previous vsit. Patient shared that [facility] was involved and someone from their team was assisting with MCD but they never heard back from them .  Plan/follow up: SW scheduled follow up visit for 2/29 @1pm$  to assist with MCD application.     SOCIAL HX:  Social History   Tobacco Use   Smoking status: Never   Smokeless tobacco: Never  Substance Use Topics   Alcohol use: No    CODE STATUS: FULL CODE ADVANCED DIRECTIVES: N MOST FORM COMPLETE:  N HOSPICE EDUCATION PROVIDED: N  PPS: 50%      Nancy Rodriguez

## 2022-06-07 NOTE — Progress Notes (Addendum)
PATIENT NAME: [name] DOB: 1945/10/17 MRN: [id number]  PRIMARY CARE PROVIDER: Leonel Ramsay, MD  RESPONSIBLE PARTY:  Acct ID - Guarantor Home Phone Work Phone Relationship Acct Type  [id number] MARLESA, [phone]  Self P/F     [address] [place], [place] [address]   Home visit completed with patient, daughter and [name], [place].  Appetite:  Has improved some.  Daughter feels there has been some weight gain that is not fluid related.   Edema:  Ascites and lower extremity edema present.  Continues with compression stockings.  Chemotherapy:  Restarted yesterday.   Palliative Care:  Patient with several agencies in place and uncertain of roll of PC.  Re-educated patient on purpose of PC.  Resources:  Currently has [facility] in place for therapy.   Also, has history with [facility].  Needs help with Medicaid application.  PC SW to assist with application next week.     CODE STATUS: Full ADVANCED DIRECTIVES: No MOST FORM: No PPS: 40%   PHYSICAL EXAM:   VITALS: Today's Vitals   06/07/22 1256  BP: (!) 160/72  Pulse: 73  Temp: (!) 97.4 F (36.3 C)  SpO2: 99%  Weight: 129 lb (58.5 kg)    LUNGS: clear to auscultation  CARDIAC: Cor RRR}  EXTREMITIES: 1-2+ lower extremity edema-compression hose in place.  SKIN: Skin color, texture, turgor normal. No rashes or lesions or mobility and turgor normal  NEURO: positive for gait problems and weakness       Lorenza Burton, RN

## 2022-06-13 ENCOUNTER — Inpatient Hospital Stay: Payer: Medicare Other

## 2022-06-14 ENCOUNTER — Other Ambulatory Visit: Payer: Medicare Other

## 2022-06-14 ENCOUNTER — Inpatient Hospital Stay: Payer: Medicare Other

## 2022-06-14 VITALS — BP 148/70 | HR 77 | Temp 96.3°F | Resp 18 | Ht 63.0 in | Wt 138.0 lb

## 2022-06-14 DIAGNOSIS — Z515 Encounter for palliative care: Secondary | ICD-10-CM

## 2022-06-14 DIAGNOSIS — E8581 Light chain (AL) amyloidosis: Secondary | ICD-10-CM | POA: Diagnosis not present

## 2022-06-14 DIAGNOSIS — E859 Amyloidosis, unspecified: Secondary | ICD-10-CM

## 2022-06-14 LAB — CBC WITH DIFFERENTIAL/PLATELET
Abs Immature Granulocytes: 0.1 10*3/uL — ABNORMAL HIGH (ref 0.00–0.07)
Basophils Absolute: 0.1 10*3/uL (ref 0.0–0.1)
Basophils Relative: 1 %
Eosinophils Absolute: 0.3 10*3/uL (ref 0.0–0.5)
Eosinophils Relative: 3 %
HCT: 31.6 % — ABNORMAL LOW (ref 36.0–46.0)
Hemoglobin: 10.9 g/dL — ABNORMAL LOW (ref 12.0–15.0)
Immature Granulocytes: 1 %
Lymphocytes Relative: 15 %
Lymphs Abs: 1.5 10*3/uL (ref 0.7–4.0)
MCH: 27.3 pg (ref 26.0–34.0)
MCHC: 34.5 g/dL (ref 30.0–36.0)
MCV: 79 fL — ABNORMAL LOW (ref 80.0–100.0)
Monocytes Absolute: 0.7 10*3/uL (ref 0.1–1.0)
Monocytes Relative: 7 %
Neutro Abs: 7.5 10*3/uL (ref 1.7–7.7)
Neutrophils Relative %: 73 %
Platelets: 379 10*3/uL (ref 150–400)
RBC: 4 MIL/uL (ref 3.87–5.11)
RDW: 25.2 % — ABNORMAL HIGH (ref 11.5–15.5)
WBC: 10.1 10*3/uL (ref 4.0–10.5)
nRBC: 0 % (ref 0.0–0.2)

## 2022-06-14 MED ORDER — DIPHENHYDRAMINE HCL 25 MG PO CAPS
50.0000 mg | ORAL_CAPSULE | Freq: Once | ORAL | Status: AC
  Administered 2022-06-14: 50 mg via ORAL
  Filled 2022-06-14: qty 2

## 2022-06-14 MED ORDER — MONTELUKAST SODIUM 10 MG PO TABS
10.0000 mg | ORAL_TABLET | Freq: Once | ORAL | Status: AC
  Administered 2022-06-14: 10 mg via ORAL
  Filled 2022-06-14: qty 1

## 2022-06-14 MED ORDER — DEXAMETHASONE 4 MG PO TABS
20.0000 mg | ORAL_TABLET | Freq: Once | ORAL | Status: AC
  Administered 2022-06-14: 20 mg via ORAL
  Filled 2022-06-14: qty 5

## 2022-06-14 MED ORDER — DARATUMUMAB-HYALURONIDASE-FIHJ 1800-30000 MG-UT/15ML ~~LOC~~ SOLN
1800.0000 mg | Freq: Once | SUBCUTANEOUS | Status: AC
  Administered 2022-06-14: 1800 mg via SUBCUTANEOUS
  Filled 2022-06-14: qty 15

## 2022-06-14 NOTE — Progress Notes (Signed)
Patient was a no show for scheduled visit today.  PC SW outreached patient and daughter, [name], before and during visit time with no answer.   SW completed basic information portion of medicaid application for patient and left the application at patients home for patient/daughter to complete and mail off to DSS. SW advised daughter of where the application was left. Daughter acknowledged this, apologized and thanked SW for efforts.

## 2022-06-14 NOTE — Patient Instructions (Signed)
[facility]  Discharge Instructions: Thank you for choosing [facility] to provide your oncology and hematology care.  If you have a lab appointment with the [facility], please go directly to the [facility] and check in at the registration area.  Wear comfortable clothing and clothing appropriate for easy access to any Portacath or PICC line.   We strive to give you quality time with your provider. You may need to reschedule your appointment if you arrive late (15 or more minutes).  Arriving late affects you and other patients whose appointments are after yours.  Also, if you miss three or more appointments without notifying the office, you may be dismissed from the clinic at the provider's discretion.      For prescription refill requests, have your pharmacy contact our office and allow 72 hours for refills to be completed.    Today you received the following chemotherapy and/or immunotherapy agents DARZALEX      To help prevent nausea and vomiting after your treatment, we encourage you to take your nausea medication as directed.  BELOW ARE SYMPTOMS THAT SHOULD BE REPORTED IMMEDIATELY: *FEVER GREATER THAN 100.4 F (38 C) OR HIGHER *CHILLS OR SWEATING *NAUSEA AND VOMITING THAT IS NOT CONTROLLED WITH YOUR NAUSEA MEDICATION *UNUSUAL SHORTNESS OF BREATH *UNUSUAL BRUISING OR BLEEDING *URINARY PROBLEMS (pain or burning when urinating, or frequent urination) *BOWEL PROBLEMS (unusual diarrhea, constipation, pain near the anus) TENDERNESS IN MOUTH AND THROAT WITH OR WITHOUT PRESENCE OF ULCERS (sore throat, sores in mouth, or a toothache) UNUSUAL RASH, SWELLING OR PAIN  UNUSUAL VAGINAL DISCHARGE OR ITCHING   Items with * indicate a potential emergency and should be followed up as soon as possible or go to the Emergency Department if any problems should occur.  Please show the CHEMOTHERAPY ALERT CARD or IMMUNOTHERAPY ALERT CARD at check-in to  the Emergency Department and triage nurse.  Should you have questions after your visit or need to cancel or reschedule your appointment, please contact [facility]  [phone] and follow the prompts.  Office hours are 8:00 a.m. to 4:30 p.m. Monday - Friday. Please note that voicemails left after 4:00 p.m. may not be returned until the following business day.  We are closed weekends and major holidays. You have access to a nurse at all times for urgent questions. Please call the main number to the clinic [phone] and follow the prompts.  For any non-urgent questions, you may also contact your provider using MyChart. We now offer e-Visits for anyone 74 and older to request care online for non-urgent symptoms. For details visit [email or web address].   Also download the MyChart app! Go to the app store, search "MyChart", open the app, select [facility], and log in with your MyChart username and password.  Daratumumab Injection What is this medication? DARATUMUMAB (dar a toom ue mab) treats multiple myeloma, a type of bone marrow cancer. It works by helping your immune system slow or stop the spread of cancer cells. It is a monoclonal antibody. This medicine may be used for other purposes; ask your health care provider or pharmacist if you have questions. COMMON BRAND NAME(S): DARZALEX What should I tell my care team before I take this medication? They need to know if you have any of these conditions: Hereditary fructose intolerance Infection, such as chickenpox, herpes, hepatitis B Lung or breathing disease, such as asthma, COPD An unusual or allergic reaction to daratumumab, sorbitol, other  medications, foods, dyes, or preservatives Pregnant or trying to get pregnant Breastfeeding How should I use this medication? This medication is injected into a vein. It is given by your care team in a hospital or clinic setting. Talk to your care team about the use  of this medication in children. Special care may be needed. Overdosage: If you think you have taken too much of this medicine contact a poison control center or emergency room at once. NOTE: This medicine is only for you. Do not share this medicine with others. What if I miss a dose? Keep appointments for follow-up doses. It is important not to miss your dose. Call your care team if you are unable to keep an appointment. What may interact with this medication? Interactions have not been studied. This list may not describe all possible interactions. Give your health care provider a list of all the medicines, herbs, non-prescription drugs, or dietary supplements you use. Also tell them if you smoke, drink alcohol, or use illegal drugs. Some items may interact with your medicine. What should I watch for while using this medication? Your condition will be monitored carefully while you are receiving this medication. This medication can cause serious allergic reactions. To reduce your risk, your care team may give you other medication to take before receiving this one. Be sure to follow the directions from your care team. This medication can affect the results of blood tests to match your blood type. These changes can last for up to 6 months after the final dose. Your care team will do blood tests to match your blood type before you start treatment. Tell all of your care team that you are being treated with this medication before receiving a blood transfusion. This medication can affect the results of some tests used to determine treatment response; extra tests may be needed to evaluate response. Talk to your care team if you wish to become pregnant or think you are pregnant. This medication can cause serious birth defects if taken during pregnancy and for 3 months after the last dose. A reliable form of contraception is recommended while taking this medication and for 3 months after the last dose. Talk to  your care team about effective forms of contraception. Do not breast-feed while taking this medication. What side effects may I notice from receiving this medication? Side effects that you should report to your care team as soon as possible: Allergic reactions--skin rash, itching, hives, swelling of the face, lips, tongue, or throat Infection--fever, chills, cough, sore throat, wounds that don't heal, pain or trouble when passing urine, general feeling of discomfort or being unwell Infusion reactions--chest pain, shortness of breath or trouble breathing, feeling faint or lightheaded Unusual bruising or bleeding Side effects that usually do not require medical attention (report to your care team if they continue or are bothersome): Constipation Diarrhea Fatigue Nausea Pain, tingling, or numbness in the hands or feet Swelling of the ankles, hands, or feet This list may not describe all possible side effects. Call your doctor for medical advice about side effects. You may report side effects to FDA at 1-800-FDA-1088. Where should I keep my medication? This medication is given in a hospital or clinic. It will not be stored at home. NOTE: This sheet is a summary. It may not cover all possible information. If you have questions about this medicine, talk to your doctor, pharmacist, or health care provider.  2023 Elsevier/Gold Standard (2021-07-26 00:00:00)

## 2022-06-15 LAB — KAPPA/LAMBDA LIGHT CHAINS
Kappa free light chain: 18.4 mg/L (ref 3.3–19.4)
Kappa, lambda light chain ratio: 1.45 (ref 0.26–1.65)
Lambda free light chains: 12.7 mg/L (ref 5.7–26.3)

## 2022-06-20 ENCOUNTER — Ambulatory Visit: Payer: Medicare Other | Admitting: Oncology

## 2022-06-20 ENCOUNTER — Other Ambulatory Visit: Payer: Medicare Other

## 2022-06-20 ENCOUNTER — Ambulatory Visit: Payer: Medicare Other

## 2022-06-21 ENCOUNTER — Inpatient Hospital Stay (HOSPITAL_BASED_OUTPATIENT_CLINIC_OR_DEPARTMENT_OTHER): Payer: Medicare Other | Admitting: Oncology

## 2022-06-21 ENCOUNTER — Other Ambulatory Visit: Payer: Self-pay

## 2022-06-21 ENCOUNTER — Encounter: Payer: Self-pay | Admitting: Oncology

## 2022-06-21 ENCOUNTER — Inpatient Hospital Stay: Payer: Medicare Other | Attending: Oncology

## 2022-06-21 ENCOUNTER — Inpatient Hospital Stay: Payer: Medicare Other

## 2022-06-21 VITALS — BP 152/71 | HR 91 | Temp 96.6°F | Resp 18 | Wt 139.5 lb

## 2022-06-21 VITALS — BP 152/71 | HR 91 | Temp 96.6°F | Resp 18 | Wt 139.1 lb

## 2022-06-21 DIAGNOSIS — E1122 Type 2 diabetes mellitus with diabetic chronic kidney disease: Secondary | ICD-10-CM | POA: Insufficient documentation

## 2022-06-21 DIAGNOSIS — E8581 Light chain (AL) amyloidosis: Secondary | ICD-10-CM | POA: Diagnosis not present

## 2022-06-21 DIAGNOSIS — D631 Anemia in chronic kidney disease: Secondary | ICD-10-CM

## 2022-06-21 DIAGNOSIS — Z79899 Other long term (current) drug therapy: Secondary | ICD-10-CM | POA: Diagnosis not present

## 2022-06-21 DIAGNOSIS — E859 Amyloidosis, unspecified: Secondary | ICD-10-CM

## 2022-06-21 DIAGNOSIS — N1831 Chronic kidney disease, stage 3a: Secondary | ICD-10-CM | POA: Diagnosis not present

## 2022-06-21 DIAGNOSIS — D500 Iron deficiency anemia secondary to blood loss (chronic): Secondary | ICD-10-CM

## 2022-06-21 DIAGNOSIS — K77 Liver disorders in diseases classified elsewhere: Secondary | ICD-10-CM

## 2022-06-21 DIAGNOSIS — E854 Organ-limited amyloidosis: Secondary | ICD-10-CM

## 2022-06-21 DIAGNOSIS — R188 Other ascites: Secondary | ICD-10-CM

## 2022-06-21 DIAGNOSIS — Z5111 Encounter for antineoplastic chemotherapy: Secondary | ICD-10-CM

## 2022-06-21 DIAGNOSIS — R197 Diarrhea, unspecified: Secondary | ICD-10-CM | POA: Insufficient documentation

## 2022-06-21 DIAGNOSIS — I130 Hypertensive heart and chronic kidney disease with heart failure and stage 1 through stage 4 chronic kidney disease, or unspecified chronic kidney disease: Secondary | ICD-10-CM | POA: Diagnosis not present

## 2022-06-21 LAB — CBC WITH DIFFERENTIAL/PLATELET
Abs Immature Granulocytes: 0.04 10*3/uL (ref 0.00–0.07)
Basophils Absolute: 0 10*3/uL (ref 0.0–0.1)
Basophils Relative: 0 %
Eosinophils Absolute: 0.1 10*3/uL (ref 0.0–0.5)
Eosinophils Relative: 1 %
HCT: 28.8 % — ABNORMAL LOW (ref 36.0–46.0)
Hemoglobin: 10.1 g/dL — ABNORMAL LOW (ref 12.0–15.0)
Immature Granulocytes: 0 %
Lymphocytes Relative: 13 %
Lymphs Abs: 1.3 10*3/uL (ref 0.7–4.0)
MCH: 27.5 pg (ref 26.0–34.0)
MCHC: 35.1 g/dL (ref 30.0–36.0)
MCV: 78.5 fL — ABNORMAL LOW (ref 80.0–100.0)
Monocytes Absolute: 0.7 10*3/uL (ref 0.1–1.0)
Monocytes Relative: 7 %
Neutro Abs: 7.5 10*3/uL (ref 1.7–7.7)
Neutrophils Relative %: 79 %
Platelets: 391 10*3/uL (ref 150–400)
RBC: 3.67 MIL/uL — ABNORMAL LOW (ref 3.87–5.11)
RDW: 24.2 % — ABNORMAL HIGH (ref 11.5–15.5)
WBC: 9.7 10*3/uL (ref 4.0–10.5)
nRBC: 0 % (ref 0.0–0.2)

## 2022-06-21 LAB — IRON AND TIBC
Iron: 46 ug/dL (ref 28–170)
Saturation Ratios: 22 % (ref 10.4–31.8)
TIBC: 211 ug/dL — ABNORMAL LOW (ref 250–450)
UIBC: 165 ug/dL

## 2022-06-21 LAB — COMPREHENSIVE METABOLIC PANEL
ALT: 23 U/L (ref 0–44)
AST: 30 U/L (ref 15–41)
Albumin: 2.9 g/dL — ABNORMAL LOW (ref 3.5–5.0)
Alkaline Phosphatase: 346 U/L — ABNORMAL HIGH (ref 38–126)
Anion gap: 10 (ref 5–15)
BUN: 33 mg/dL — ABNORMAL HIGH (ref 8–23)
CO2: 23 mmol/L (ref 22–32)
Calcium: 8.5 mg/dL — ABNORMAL LOW (ref 8.9–10.3)
Chloride: 107 mmol/L (ref 98–111)
Creatinine, Ser: 1.42 mg/dL — ABNORMAL HIGH (ref 0.44–1.00)
GFR, Estimated: 38 mL/min — ABNORMAL LOW (ref >60)
Glucose, Bld: 244 mg/dL — ABNORMAL HIGH (ref 70–99)
Potassium: 3.3 mmol/L — ABNORMAL LOW (ref 3.5–5.1)
Sodium: 140 mmol/L (ref 135–145)
Total Bilirubin: 2.2 mg/dL — ABNORMAL HIGH (ref 0.3–1.2)
Total Protein: 6.4 g/dL — ABNORMAL LOW (ref 6.5–8.1)

## 2022-06-21 LAB — FERRITIN: Ferritin: 429 ng/mL — ABNORMAL HIGH (ref 11–307)

## 2022-06-21 MED ORDER — MONTELUKAST SODIUM 10 MG PO TABS
10.0000 mg | ORAL_TABLET | Freq: Once | ORAL | Status: AC
  Administered 2022-06-21: 10 mg via ORAL
  Filled 2022-06-21: qty 1

## 2022-06-21 MED ORDER — DARATUMUMAB-HYALURONIDASE-FIHJ 1800-30000 MG-UT/15ML ~~LOC~~ SOLN
1800.0000 mg | Freq: Once | SUBCUTANEOUS | Status: AC
  Administered 2022-06-21: 1800 mg via SUBCUTANEOUS
  Filled 2022-06-21: qty 15

## 2022-06-21 MED ORDER — DEXAMETHASONE 4 MG PO TABS
20.0000 mg | ORAL_TABLET | Freq: Once | ORAL | Status: AC
  Administered 2022-06-21: 20 mg via ORAL
  Filled 2022-06-21: qty 5

## 2022-06-21 MED ORDER — DIPHENHYDRAMINE HCL 25 MG PO CAPS
50.0000 mg | ORAL_CAPSULE | Freq: Once | ORAL | Status: AC
  Administered 2022-06-21: 50 mg via ORAL
  Filled 2022-06-21: qty 2

## 2022-06-21 NOTE — Assessment & Plan Note (Signed)
Patient declines paracentesis due to currently she is asymptomatic.  Continue observation

## 2022-06-21 NOTE — Assessment & Plan Note (Signed)
Treatment plan as listed above

## 2022-06-21 NOTE — Patient Instructions (Signed)
[facility]  Discharge Instructions: Thank you for choosing [facility] to provide your oncology and hematology care.  If you have a lab appointment with the [facility], please go directly to the [facility] and check in at the registration area.  Wear comfortable clothing and clothing appropriate for easy access to any Portacath or PICC line.   We strive to give you quality time with your provider. You may need to reschedule your appointment if you arrive late (15 or more minutes).  Arriving late affects you and other patients whose appointments are after yours.  Also, if you miss three or more appointments without notifying the office, you may be dismissed from the clinic at the provider's discretion.      For prescription refill requests, have your pharmacy contact our office and allow 72 hours for refills to be completed.    Today you received the following chemotherapy and/or immunotherapy agents DARZALEX       To help prevent nausea and vomiting after your treatment, we encourage you to take your nausea medication as directed.  BELOW ARE SYMPTOMS THAT SHOULD BE REPORTED IMMEDIATELY: *FEVER GREATER THAN 100.4 F (38 C) OR HIGHER *CHILLS OR SWEATING *NAUSEA AND VOMITING THAT IS NOT CONTROLLED WITH YOUR NAUSEA MEDICATION *UNUSUAL SHORTNESS OF BREATH *UNUSUAL BRUISING OR BLEEDING *URINARY PROBLEMS (pain or burning when urinating, or frequent urination) *BOWEL PROBLEMS (unusual diarrhea, constipation, pain near the anus) TENDERNESS IN MOUTH AND THROAT WITH OR WITHOUT PRESENCE OF ULCERS (sore throat, sores in mouth, or a toothache) UNUSUAL RASH, SWELLING OR PAIN  UNUSUAL VAGINAL DISCHARGE OR ITCHING   Items with * indicate a potential emergency and should be followed up as soon as possible or go to the Emergency Department if any problems should occur.  Please show the CHEMOTHERAPY ALERT CARD or IMMUNOTHERAPY ALERT CARD at check-in to  the Emergency Department and triage nurse.  Should you have questions after your visit or need to cancel or reschedule your appointment, please contact [facility]  [phone] and follow the prompts.  Office hours are 8:00 a.m. to 4:30 p.m. Monday - Friday. Please note that voicemails left after 4:00 p.m. may not be returned until the following business day.  We are closed weekends and major holidays. You have access to a nurse at all times for urgent questions. Please call the main number to the clinic [phone] and follow the prompts.  For any non-urgent questions, you may also contact your provider using MyChart. We now offer e-Visits for anyone 49 and older to request care online for non-urgent symptoms. For details visit [email or web address].   Also download the MyChart app! Go to the app store, search "MyChart", open the app, select [facility], and log in with your MyChart username and password.   Daratumumab Injection What is this medication? DARATUMUMAB (dar a toom ue mab) treats multiple myeloma, a type of bone marrow cancer. It works by helping your immune system slow or stop the spread of cancer cells. It is a monoclonal antibody. This medicine may be used for other purposes; ask your health care provider or pharmacist if you have questions. COMMON BRAND NAME(S): DARZALEX What should I tell my care team before I take this medication? They need to know if you have any of these conditions: Hereditary fructose intolerance Infection, such as chickenpox, herpes, hepatitis B Lung or breathing disease, such as asthma, COPD An unusual or allergic reaction to daratumumab,  sorbitol, other medications, foods, dyes, or preservatives Pregnant or trying to get pregnant Breastfeeding How should I use this medication? This medication is injected into a vein. It is given by your care team in a hospital or clinic setting. Talk to your care team about the  use of this medication in children. Special care may be needed. Overdosage: If you think you have taken too much of this medicine contact a poison control center or emergency room at once. NOTE: This medicine is only for you. Do not share this medicine with others. What if I miss a dose? Keep appointments for follow-up doses. It is important not to miss your dose. Call your care team if you are unable to keep an appointment. What may interact with this medication? Interactions have not been studied. This list may not describe all possible interactions. Give your health care provider a list of all the medicines, herbs, non-prescription drugs, or dietary supplements you use. Also tell them if you smoke, drink alcohol, or use illegal drugs. Some items may interact with your medicine. What should I watch for while using this medication? Your condition will be monitored carefully while you are receiving this medication. This medication can cause serious allergic reactions. To reduce your risk, your care team may give you other medication to take before receiving this one. Be sure to follow the directions from your care team. This medication can affect the results of blood tests to match your blood type. These changes can last for up to 6 months after the final dose. Your care team will do blood tests to match your blood type before you start treatment. Tell all of your care team that you are being treated with this medication before receiving a blood transfusion. This medication can affect the results of some tests used to determine treatment response; extra tests may be needed to evaluate response. Talk to your care team if you wish to become pregnant or think you are pregnant. This medication can cause serious birth defects if taken during pregnancy and for 3 months after the last dose. A reliable form of contraception is recommended while taking this medication and for 3 months after the last dose. Talk  to your care team about effective forms of contraception. Do not breast-feed while taking this medication. What side effects may I notice from receiving this medication? Side effects that you should report to your care team as soon as possible: Allergic reactions--skin rash, itching, hives, swelling of the face, lips, tongue, or throat Infection--fever, chills, cough, sore throat, wounds that don't heal, pain or trouble when passing urine, general feeling of discomfort or being unwell Infusion reactions--chest pain, shortness of breath or trouble breathing, feeling faint or lightheaded Unusual bruising or bleeding Side effects that usually do not require medical attention (report to your care team if they continue or are bothersome): Constipation Diarrhea Fatigue Nausea Pain, tingling, or numbness in the hands or feet Swelling of the ankles, hands, or feet This list may not describe all possible side effects. Call your doctor for medical advice about side effects. You may report side effects to FDA at 1-800-FDA-1088. Where should I keep my medication? This medication is given in a hospital or clinic. It will not be stored at home. NOTE: This sheet is a summary. It may not cover all possible information. If you have questions about this medicine, talk to your doctor, pharmacist, or health care provider.  2023 Elsevier/Gold Standard (2021-07-26 00:00:00)

## 2022-06-21 NOTE — Assessment & Plan Note (Signed)
Hb is stable. Repeat iron panel

## 2022-06-21 NOTE — Progress Notes (Signed)
Pt here for follow up. Pt would like to know if she should continue ferrous sulfate BID.

## 2022-06-21 NOTE — Assessment & Plan Note (Signed)
Hemoglobin is stable. Monitor

## 2022-06-21 NOTE — Progress Notes (Signed)
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  Discussed with [facility] Nancy Rodriguez, will resume patient on Daratumumab.  Recommendation was discussed with patient and her daughter today. They agree with the plan Labs are reviewed and discussed with patient. Proceed with Daratumumab treatment, 1 week lab D76 Daratumumab    Encounter for antineoplastic chemotherapy Treatment plan as listed above  Ascites Patient declines paracentesis due to currently she is asymptomatic.  Continue observation  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HCC) Hemoglobin is stable. 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 Continue Furosemide, Spirolactone, follow up with Hepatology Nancy Rodriguez.   Diarrhea [facility] Nancy Rodriguez recommends endoscopy, likely amyloidosis deposit in GI tract, patient was not interested. Nancy Rodriguez recommend imodium PRN Today she feels that she may be interested, I recommend her to further discuss with Nancy Rodriguez.   IDA (iron deficiency anemia) Hb is stable. Repeat iron panel   Orders Placed This Encounter  Procedures   Iron and TIBC    Standing Status:   Future    Number of Occurrences:   1    Standing Expiration Date:   06/21/2023   Ferritin    Standing Status:   Future    Number of Occurrences:   1    Standing Expiration Date:   06/21/2023   Follow up  per LOS  All questions were answered. The patient knows to call the clinic with any problems, questions or concerns.  Nancy Server, MD, PhD [facility] Hematology Oncology 06/21/2022   HISTORY OF PRESENTING ILLNESS:   Nancy Rodriguez is a   77 y.o.  female presents for follow up of amyloidosis.   Reviewed her previous medical records via care everywhere.  She was seen by H[facility]ology [facility]14/2017.  07/25/2007  SPEP showed M protein of 0.35, IFE showed IgG lamda 09/22/2007  Bone survey negative.  02/15/14 IgG 930, SPEP M protien 0.22, free kappa light chain 3.59, lamda 2.92, ratio 1.23  Hypercalcemia, resolved after stopping HCTZ.  # Transaminitis 03/28/20 US abdomen showed left liver lobe Complex 1.7 cm cyst  # 04/07/20 MRI abdomen w/wo contrast showed 2 benign liver cysts, 2 nonspecific hypovascular irighr liver lesions, abnormal bone marrow signal of L1 vertebral body. Patient was  advised to proceed with bone marrow biopsy and she declined.  # referred her to established care with GI and was seen on 04/19/20,  # Liver biopsy showed amyloidosis. [place] MS/MS showed AL type # 05/20/20 recommend bone marrow biopsy which is scheduled on 05/24/2020.  Patient changed her mind and ask a biopsy to be canceled.  My team and I had multiple phone discussion with patient's daughter Nancy Rodriguez and later with the patient's son Nancy Rodriguez.  Patient agreed with bone marrow biopsy and a biopsy was scheduled on 06/09/2020.  05/20/20 NT proBNP  was normal,  TSH slightly increased, normal T4 Normal factor X Normal coags Troponin 19. Refer to cardiology for evaluation. May need cardiac MRI  # 06/01/2020 2D echo result was reviewed and discussed with patient.  Patient has normal LVEF 60-65%. Mild asymmetric left ventricular hypertrophy.  Left ventricular diastolic parameters are indeterminate. average left ventricular global longitudinal strain is -16.8 %.  # 06/09/2020, bone marrow  biopsy showed monoclonal plasmacytosis, 8%, amyloid deposit present.  Absent iron stores. 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 t (14;20) #06/17/2020, further discussed about diagnosis and treatment plan.  Patient declined bone  marrow transplant evaluation.  Decision was made to proceed with Dara-CyborD chemotherapy treatments. #May 2022, second opinion at [place] with Nancy Rodriguez.who agrees with current treatment plan.  He recommends to lower dexamethasone to 20 mg and decrease premed Tylenol to 325 mg  09/01/2020 cardiac MRI was performed at [facility].  Left ventricle is normal in size.  Mild to moderate basal septal hypertrophy.  Hyper dynamic LV function.  LVEF 71%.  Right ventricle is normal in size and wall thickness.  Systolic function normal.  Mild bi atrial enlargement, no significant aortic valve stenosis or regurgitation.  Trivial mitral regurgitation, mild tricuspid regurgitation and a trivial pulmonic regurgitation.  No evidence of MI, scarring or infiltration.  #History of major depression listed in outside problem list.  previous diagnosis and treatment details are not available to me in EMR  I have referred her to psychiatrist  #  vaginal bleeding which stopped- she was seen by GYN and recommended for biopsy.   # She has poor IV access and was not able to receive IV Emend and IV Aloxi. She does not want to have med port.   # GERD   Nancy Rodriguez has increased her omeprazole to 40 mg daily. She feels symptoms are improved.  # 10/08/2020, cervical spine showed cervical spondylosis.  Spinal canal diagnosis and a posterior disc osteophyte complex and superimposed small central disc protrusion contact the ventral spinal cord at C4-C5.  Multilevel neuroforaminal narrowing. bilateral atlantooccipital joint effusions, multilevel grade 1 spondylolisthesis. MRI lumbar spine without contrast showed acute or subacute fracture of L4.   Moderate spinal stenosis at L3-4 has progressed. Shallow left foraminal disc protrusion has progressed. Moderate subarticular stenosis on the left also with progression  Moderate spinal stenosis L4-5 with progression. Moderate subarticular stenosis on the left with progression  # 10/25/2020 seen by  [facility]ett who recommend patient to switch Dara CyBorD to Dara RD  # 11/03/2020 Revlimid (lenalidomide) 10 mg by mouth daily for 14 days, then hold for 7 days. Pt was started on a shortened cycle in order to remain on similar schedule with infusion.   # 11/22/2020 seen by hepatologist Nancy Rodriguez Rodman Key. Recommended to start Zoloft '50mg'$  daily for pruritis # 11/24/2020 cycle 2 Revlimid 10 mg, she finished 14 days.  Rest of the course was held due to anemia.  Patient declined blood transfusion.  # Vaginal bleeding  patient was seen by Dr. Leafy Ro,  on megace '40mg'$  daily. 12/22/2020 repeat EMBx - not diagnostic due to insufficient squamous component.   03/03/2021, ultrasound abdomen complete showed possible hypoechoic mass in the pancreatic tail.  1.8 x 1.2 x 1.4 cm.  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  Cholelithiasis.  MRI has been ordered for further evaluation and is scheduled.  + Falls and presented to [facility].  Patient was found to have marked hyperglycemia, AKI, UTI. 05/03/2021, CT head showed chronic appearing subdural hematoma, measuring up to 1.6 on the left and 0.9 on the right. 05/18/2021, patient was seen by cardiology [name], patient was recommended to continue torsemide 20 mg daily as needed for edema.  Patient was recommended to discontinue aspirin 81 mg due to frequent falls and chronic subdural hematoma.  Patient has not had further falls.  She has home physical therapy  + Chronic diarrhea, GI Nancy Rodriguez recommended endoscopy evaluation and she declined.  INTERVAL HISTORY Nancy Rodriguez is a 77 y.o. female who has above history reviewed by me today presents for follow up visit for management of AL amyloidosis Patient was accompanied by her daughter today.   Patient follows up with [facility] oncology and hepatology. Denies any abdominal pressure or pain.  Denies fever, nausea, vomiting. Appetite is fair. She tolerates Daratumumab. Appetite is good,  has gained weight.  + chronic diarrhea, 3-4 times per day.  .    Review of Systems  Constitutional:  Positive for fatigue. Negative for appetite change, chills, fever and unexpected weight change.  HENT:   Negative for hearing loss, nosebleeds and voice change.   Eyes:  Negative for eye problems and icterus.  Respiratory:  Negative for chest tightness and cough.   Cardiovascular:  Negative for chest pain and leg swelling.  Gastrointestinal:  Positive for abdominal distention and diarrhea. Negative for abdominal pain, blood in stool and nausea.  Endocrine: Negative for hot flashes.  Genitourinary:  Negative for difficulty urinating and frequency.   Musculoskeletal:  Positive for back pain. Negative for arthralgias.  Skin:  Negative for itching and rash.  Neurological:  Positive for numbness. Negative for extremity weakness.  Hematological:  Negative for adenopathy.  Psychiatric/Behavioral:  Negative for confusion.     MEDICAL HISTORY:  Past Medical History:  Diagnosis Date   (HFpEF) heart failure with preserved ejection fraction ([place])    a. 05/2020 Echo: EF 60-65%; b. 08/2020 cMRI ([facility]): EF 71%, no delayed hyperenhancement to suggest scar/infiltrative dzs. Nl RV fxn. Mild BAE. Triv MR, mild TR; c. 06/2021 Echo: EF 50-55%, no rwma, mild basal-septal LVH, GrII DD. Nl RV size/fxn. Mild MR. Ao sclerosis.   AL amyloidosis ([place])    Anemia due to chronic kidney disease    Anemia in chronic kidney disease   CKD (chronic kidney disease), stage III (HCC)    Diabetes mellitus without complication ([place])    History of cardiovascular stress test    a. 03/2012 Stress Echo([facility]): nl study.   Hypercholesterolemia    Hypertension    MGUS (monoclonal gammopathy of unknown significance)    Osteoarthritis    Rheumatoid arthritis ([place])     SURGICAL HISTORY: Past Surgical History:  Procedure Laterality Date   HERNIA REPAIR     INTRAMEDULLARY (IM) NAIL INTERTROCHANTERIC Right 09/26/2021   Procedure:  INTRAMEDULLARY (IM) NAIL INTERTROCHANTRIC;  Surgeon: Thornton Park, MD;  Location: [facility] ORS;  Service: Orthopedics;  Laterality: Right;   PARATHYROIDECTOMY      SOCIAL HISTORY: Social History   Socioeconomic History   Marital status: Widowed    Spouse name: Not on file   Number of children: 6   Years of education: Not on file   Highest education level: Not on file  Occupational History   Not on file  Tobacco Use   Smoking status: Never   Smokeless tobacco: Never  Vaping Use   Vaping Use: Never used  Substance and Sexual Activity   Alcohol use: No   Drug use: Never   Sexual activity: Not on file  Other Topics Concern   Not on file  Social History Narrative   Not on file   Social Determinants of Health   Financial Resource Strain: Not on file  Food Insecurity: Not on file  Transportation Needs: Not on file  Physical Activity: Not on file  Stress: Not on file  Social Connections:  Not on file  Intimate Partner Violence: Not on file    FAMILY HISTORY: Family History  Problem Relation Age of Onset   Diabetes Daughter    Diabetes Son    Dementia Mother    Cancer Father    Esophageal cancer Sister    Brain cancer Brother     ALLERGIES:  is allergic to benazepril, lisinopril, tolmetin, and nsaids.  MEDICATIONS:  Current Outpatient Medications  Medication Sig Dispense Refill   acyclovir (ZOVIRAX) 400 MG tablet Take 1 tablet (400 mg total) by mouth 2 (two) times daily. 60 tablet 2   albuterol (VENTOLIN HFA) 108 (90 Base) MCG/ACT inhaler Inhale 2 puffs into the lungs every 4 (four) hours as needed for wheezing or shortness of breath.     amLODipine (NORVASC) 5 MG tablet Take 5 mg by mouth daily.     ferrous sulfate 325 (65 FE) MG tablet Take 325 mg by mouth 2 (two) times daily with a meal.     gabapentin (NEURONTIN) 100 MG capsule Take 100 mg by mouth at bedtime.     loperamide (IMODIUM) 2 MG capsule Take 1 capsule (2 mg total) by mouth See admin instructions.  Initial: 4 mg, followed by 2 mg after each loose stool, maximum 16 mg within 24 hours. 60 capsule 0   metoprolol succinate (TOPROL-XL) 50 MG 24 hr tablet Take 50 mg by mouth daily.     omeprazole (PRILOSEC) 20 MG capsule Take 20 mg by mouth 2 (two) times daily.     polyethylene glycol (MIRALAX / GLYCOLAX) 17 g packet Take 17 g by mouth daily as needed for mild constipation. 14 each 0   spironolactone (ALDACTONE) 100 MG tablet Take 200 mg by mouth daily.     torsemide (DEMADEX) 20 MG tablet Take 20 mg by mouth daily as needed.     traMADol (ULTRAM) 50 MG tablet Take 1 tablet (50 mg total) by mouth every 12 (twelve) hours as needed for moderate pain. 10 tablet 0   EPINEPHrine 0.3 mg/0.3 mL IJ SOAJ injection Inject 0.3 mg into the muscle as needed. (Patient not taking: Reported on 06/21/2022)     sertraline (ZOLOFT) 25 MG tablet Take 50 mg by mouth daily. (Patient not taking: Reported on 06/21/2022)     No current facility-administered medications for this visit.     PHYSICAL EXAMINATION: ECOG PERFORMANCE STATUS: 2 - Symptomatic, <50% confined to bed Vitals:   06/21/22 1030  BP: (!) 152/71  Pulse: 91  Resp: 18  Temp: (!) 96.6 F (35.9 C)  SpO2: 99%   Filed Weights   06/21/22 1030  Weight: 139 lb 7.8 oz (63.3 kg)    Physical Exam Constitutional:      General: She is not in acute distress.    Appearance: She is ill-appearing.  HENT:     Head: Normocephalic and atraumatic.  Eyes:     General: No scleral icterus. Cardiovascular:     Rate and Rhythm: Normal rate and regular rhythm.     Heart sounds: Murmur heard.  Pulmonary:     Effort: Pulmonary effort is normal. No respiratory distress.     Breath sounds: No wheezing.  Abdominal:     General: Bowel sounds are normal. There is distension.     Palpations: Abdomen is soft.  Musculoskeletal:        General: No deformity. Normal range of motion.     Cervical back: Normal range of motion and neck supple.     Comments: Bilateral  lower extremities trace edema  Skin:    General: Skin is warm and dry.     Findings: No erythema or rash.     Comments: Several subcentimeter subcutaneous firm nodules on abdomen wall  Neurological:     Mental Status: She is alert. Mental status is at baseline.     Cranial Nerves: No cranial nerve deficit.     Coordination: Coordination normal.  Psychiatric:        Mood and Affect: Mood normal.     LABORATORY DATA:  I have reviewed the data as listed    Latest Ref Rng & Units 06/21/2022    9:47 AM 06/14/2022    9:09 AM 06/06/2022    8:41 AM  CBC  WBC 4.0 - 10.5 K/uL 9.7  10.1  4.1   Hemoglobin 12.0 - 15.0 g/dL 10.1  10.9  10.0   Hematocrit 36.0 - 46.0 % 28.8  31.6  29.0   Platelets 150 - 400 K/uL 391  379  415       Latest Ref Rng & Units 06/21/2022    9:47 AM 06/06/2022    8:41 AM 05/11/2022    9:59 AM  CMP  Glucose 70 - 99 mg/dL 244  212  150   BUN 8 - 23 mg/dL 33  34  34   Creatinine 0.44 - 1.00 mg/dL 1.42  1.43  1.35   Sodium 135 - 145 mmol/L 140  140  140   Potassium 3.5 - 5.1 mmol/L 3.3  3.2  3.2   Chloride 98 - 111 mmol/L 107  108  104   CO2 22 - 32 mmol/L '23  22  24   '$ Calcium 8.9 - 10.3 mg/dL 8.5  8.7  8.8   Total Protein 6.5 - 8.1 g/dL 6.4  6.4  6.6   Total Bilirubin 0.3 - 1.2 mg/dL 2.2  2.4  3.3   Alkaline Phos 38 - 126 U/L 346  419  459   AST 15 - 41 U/L 30  39  43   ALT 0 - 44 U/L '23  24  23      '$ Iron/TIBC/Ferritin/ %Sat    Component Value Date/Time   IRON 23 (L) 02/16/2021 1042   TIBC 316 02/16/2021 1042   FERRITIN 28 02/16/2021 1042   IRONPCTSAT 7 (L) 02/16/2021 1042     08/25/2019, platelet count 491, WBC 7.5, hemoglobin 12 Creatinine 1.58, EGFR 37, calcium 10.8, albumin 4.2 Negative hepatitis B surface antigen, hepatitis B core antibody, Negative hepatitis C 08/05/2019, A1c 11.2   RADIOGRAPHIC STUDIES: I have personally reviewed the radiological images as listed and agreed with the findings in the report. No results found.

## 2022-06-21 NOTE — Assessment & Plan Note (Addendum)
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  Discussed with [facility] Dr.Kang, will resume patient on Daratumumab.  Recommendation was discussed with patient and her daughter today. They agree with the plan Labs are reviewed and discussed with patient. Proceed with Daratumumab treatment, 1 week lab D22 Daratumumab

## 2022-06-21 NOTE — Assessment & Plan Note (Signed)
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 Continue Furosemide, Spirolactone, follow up with Hepatology Dr. Dorris Fetch.

## 2022-06-21 NOTE — Assessment & Plan Note (Signed)
[facility] Dr.Kappus recommends endoscopy, likely amyloidosis deposit in GI tract, patient was not interested. Dr.Kappus recommend imodium PRN Today she feels that she may be interested, I recommend her to further discuss with Dr. Dorris Fetch.

## 2022-06-22 ENCOUNTER — Telehealth: Payer: Self-pay

## 2022-06-22 NOTE — Telephone Encounter (Signed)
-----   Message from [name], MD sent at 06/22/2022  8:39 AM EST ----- Please let patient know that her iron level is better, she can stop iron supplementation.

## 2022-06-22 NOTE — Telephone Encounter (Signed)
Called and spoke to patient and informed her that her iron levels are better and that she can stop her iron supplements. Patient gave verbal understanding to this.

## 2022-06-27 ENCOUNTER — Ambulatory Visit: Payer: Medicare Other

## 2022-06-27 ENCOUNTER — Other Ambulatory Visit: Payer: Medicare Other

## 2022-06-28 ENCOUNTER — Emergency Department: Payer: Medicare Other

## 2022-06-28 ENCOUNTER — Inpatient Hospital Stay: Payer: Medicare Other

## 2022-06-28 ENCOUNTER — Inpatient Hospital Stay (HOSPITAL_BASED_OUTPATIENT_CLINIC_OR_DEPARTMENT_OTHER): Payer: Medicare Other | Admitting: Oncology

## 2022-06-28 ENCOUNTER — Encounter: Payer: Self-pay | Admitting: Oncology

## 2022-06-28 ENCOUNTER — Encounter: Payer: Self-pay | Admitting: Emergency Medicine

## 2022-06-28 ENCOUNTER — Ambulatory Visit
Admission: RE | Admit: 2022-06-28 | Discharge: 2022-06-28 | Disposition: A | Discharge status: Home / Self Care | Payer: Medicare Other | Source: Ambulatory Visit | Attending: Oncology | Admitting: Oncology

## 2022-06-28 ENCOUNTER — Other Ambulatory Visit: Payer: Self-pay

## 2022-06-28 ENCOUNTER — Emergency Department
Admission: EM | Admit: 2022-06-28 | Discharge: 2022-06-28 | Disposition: A | Discharge status: Home / Self Care | Payer: Medicare Other | Attending: Emergency Medicine | Admitting: Emergency Medicine

## 2022-06-28 VITALS — BP 171/85 | HR 97 | Temp 97.6°F | Resp 18 | Wt 146.8 lb

## 2022-06-28 VITALS — BP 175/92 | HR 98 | Temp 96.7°F | Resp 18 | Wt 146.8 lb

## 2022-06-28 DIAGNOSIS — R188 Other ascites: Secondary | ICD-10-CM

## 2022-06-28 DIAGNOSIS — N189 Chronic kidney disease, unspecified: Secondary | ICD-10-CM | POA: Insufficient documentation

## 2022-06-28 DIAGNOSIS — E1122 Type 2 diabetes mellitus with diabetic chronic kidney disease: Secondary | ICD-10-CM | POA: Diagnosis not present

## 2022-06-28 DIAGNOSIS — W01198A Fall on same level from slipping, tripping and stumbling with subsequent striking against other object, initial encounter: Secondary | ICD-10-CM | POA: Diagnosis not present

## 2022-06-28 DIAGNOSIS — M7989 Other specified soft tissue disorders: Secondary | ICD-10-CM

## 2022-06-28 DIAGNOSIS — I509 Heart failure, unspecified: Secondary | ICD-10-CM | POA: Insufficient documentation

## 2022-06-28 DIAGNOSIS — K77 Liver disorders in diseases classified elsewhere: Secondary | ICD-10-CM

## 2022-06-28 DIAGNOSIS — E859 Amyloidosis, unspecified: Secondary | ICD-10-CM

## 2022-06-28 DIAGNOSIS — N1831 Chronic kidney disease, stage 3a: Secondary | ICD-10-CM

## 2022-06-28 DIAGNOSIS — Z5111 Encounter for antineoplastic chemotherapy: Secondary | ICD-10-CM | POA: Diagnosis not present

## 2022-06-28 DIAGNOSIS — S0990XA Unspecified injury of head, initial encounter: Secondary | ICD-10-CM | POA: Diagnosis present

## 2022-06-28 DIAGNOSIS — S0083XA Contusion of other part of head, initial encounter: Secondary | ICD-10-CM | POA: Diagnosis not present

## 2022-06-28 DIAGNOSIS — W19XXXA Unspecified fall, initial encounter: Secondary | ICD-10-CM

## 2022-06-28 DIAGNOSIS — I130 Hypertensive heart and chronic kidney disease with heart failure and stage 1 through stage 4 chronic kidney disease, or unspecified chronic kidney disease: Secondary | ICD-10-CM | POA: Insufficient documentation

## 2022-06-28 DIAGNOSIS — E854 Organ-limited amyloidosis: Secondary | ICD-10-CM

## 2022-06-28 DIAGNOSIS — E8581 Light chain (AL) amyloidosis: Secondary | ICD-10-CM

## 2022-06-28 DIAGNOSIS — D631 Anemia in chronic kidney disease: Secondary | ICD-10-CM

## 2022-06-28 LAB — CBC WITH DIFFERENTIAL/PLATELET
Abs Immature Granulocytes: 0.06 10*3/uL (ref 0.00–0.07)
Abs Immature Granulocytes: 0.05 10*3/uL (ref 0.00–0.07)
Basophils Absolute: 0 10*3/uL (ref 0.0–0.1)
Basophils Absolute: 0 10*3/uL (ref 0.0–0.1)
Basophils Relative: 0 %
Basophils Relative: 0 %
Eosinophils Absolute: 0.1 10*3/uL (ref 0.0–0.5)
Eosinophils Absolute: 0 10*3/uL (ref 0.0–0.5)
Eosinophils Relative: 1 %
Eosinophils Relative: 0 %
HCT: 30.8 % — ABNORMAL LOW (ref 36.0–46.0)
HCT: 32.6 % — ABNORMAL LOW (ref 36.0–46.0)
Hemoglobin: 10.7 g/dL — ABNORMAL LOW (ref 12.0–15.0)
Hemoglobin: 11.2 g/dL — ABNORMAL LOW (ref 12.0–15.0)
Immature Granulocytes: 1 %
Immature Granulocytes: 1 %
Lymphocytes Relative: 14 %
Lymphocytes Relative: 10 %
Lymphs Abs: 1.1 10*3/uL (ref 0.7–4.0)
Lymphs Abs: 0.7 10*3/uL (ref 0.7–4.0)
MCH: 27.2 pg (ref 26.0–34.0)
MCH: 27 pg (ref 26.0–34.0)
MCHC: 34.7 g/dL (ref 30.0–36.0)
MCHC: 34.4 g/dL (ref 30.0–36.0)
MCV: 78.2 fL — ABNORMAL LOW (ref 80.0–100.0)
MCV: 78.6 fL — ABNORMAL LOW (ref 80.0–100.0)
Monocytes Absolute: 0.7 10*3/uL (ref 0.1–1.0)
Monocytes Absolute: 0.1 10*3/uL (ref 0.1–1.0)
Monocytes Relative: 9 %
Monocytes Relative: 1 %
Neutro Abs: 6.3 10*3/uL (ref 1.7–7.7)
Neutro Abs: 5.9 10*3/uL (ref 1.7–7.7)
Neutrophils Relative %: 75 %
Neutrophils Relative %: 88 %
Platelets: 429 10*3/uL — ABNORMAL HIGH (ref 150–400)
Platelets: 440 10*3/uL — ABNORMAL HIGH (ref 150–400)
RBC: 3.94 MIL/uL (ref 3.87–5.11)
RBC: 4.15 MIL/uL (ref 3.87–5.11)
RDW: 24.3 % — ABNORMAL HIGH (ref 11.5–15.5)
RDW: 24.2 % — ABNORMAL HIGH (ref 11.5–15.5)
Smear Review: NORMAL
WBC: 8.3 10*3/uL (ref 4.0–10.5)
WBC: 6.7 10*3/uL (ref 4.0–10.5)
nRBC: 0 % (ref 0.0–0.2)
nRBC: 0 % (ref 0.0–0.2)

## 2022-06-28 LAB — SEDIMENTATION RATE: Sed Rate: 58 mm/hr — ABNORMAL HIGH (ref 0–30)

## 2022-06-28 MED ORDER — MUPIROCIN 2 % EX OINT: TOPICAL_OINTMENT | CUTANEOUS | 0 refills | Status: DC

## 2022-06-28 MED ORDER — DEXAMETHASONE 4 MG PO TABS
20.0000 mg | ORAL_TABLET | Freq: Once | ORAL | Status: AC
  Administered 2022-06-28: 20 mg via ORAL
  Filled 2022-06-28: qty 5

## 2022-06-28 MED ORDER — DIPHENHYDRAMINE HCL 25 MG PO CAPS
50.0000 mg | ORAL_CAPSULE | Freq: Once | ORAL | Status: AC
  Administered 2022-06-28: 50 mg via ORAL
  Filled 2022-06-28: qty 2

## 2022-06-28 MED ORDER — DARATUMUMAB-HYALURONIDASE-FIHJ 1800-30000 MG-UT/15ML ~~LOC~~ SOLN
1800.0000 mg | Freq: Once | SUBCUTANEOUS | Status: AC
  Administered 2022-06-28: 1800 mg via SUBCUTANEOUS
  Filled 2022-06-28: qty 15

## 2022-06-28 NOTE — Assessment & Plan Note (Signed)
Treatment plan as listed above. 

## 2022-06-28 NOTE — ED Provider Notes (Signed)
[facility] Emergency Department Provider Note     Event Date/Time   First MD Initiated Contact with Patient 06/28/22 1729     (approximate)   History   Fall   HPI  Nancy Rodriguez is a 77 y.o. female with a history of amyloidosis, HTN, DM, CHF, CKD, multiple myeloma, and RA, presents to the ED following a mechanical fall.  Patient was walking out of her doctor's office and she apparently was walking too fast, went to lift up her walker, and she lost her balance, falling backwards, hitting the back of her head.  Patient presents via EMS from the outpatient office.  She denies any blood thinner use, LOC, nausea, vomiting, dizziness, or weakness.  No other injury reported at this time. Patient denies any significant head or neck pain.   Physical Exam   Triage Vital Signs: ED Triage Vitals  Enc Vitals Group     BP 06/28/22 1657 (!) 160/81     Pulse Rate 06/28/22 1656 81     Resp 06/28/22 1656 18     Temp 06/28/22 1656 98.6 F (37 C)     Temp Source 06/28/22 1656 Oral     SpO2 06/28/22 1656 97 %     Weight 06/28/22 1655 140 lb (63.5 kg)     Height 06/28/22 1655 5' 3.5" (1.613 m)     Head Circumference --      Peak Flow --      Pain Score 06/28/22 1655 0     Pain Loc --      Pain Edu? --      Excl. in [place]? --     Most recent vital signs: Vitals:   06/28/22 1657 06/28/22 1914  BP: (!) 160/81 (!) 157/100  Pulse:  78  Resp:  16  Temp:    SpO2:  100%    General Awake, no distress. NAD HEENT NCAT. PERRL. EOMI. No rhinorrhea. Mucous membranes are moist.  CV:  Good peripheral perfusion.  RESP:  Normal effort.  ABD:  No distention.  NEURO: Cranial nerves II to XII grossly intact.  ED Results / Procedures / Treatments   Labs (all labs ordered are listed, but only abnormal results are displayed) Labs Reviewed  SEDIMENTATION RATE - Abnormal; Notable for the following components:      Result Value   Sed Rate 58 (*)    All other components  within normal limits  CBC WITH DIFFERENTIAL/PLATELET - Abnormal; Notable for the following components:   Hemoglobin 11.2 (*)    HCT 32.6 (*)    MCV 78.6 (*)    RDW 24.2 (*)    Platelets 440 (*)    All other components within normal limits     EKG   RADIOLOGY  I personally viewed and evaluated these images as part of my medical decision making, as well as reviewing the written report by the radiologist.  ED Provider Interpretation: no acute findings  CT Cervical Spine Wo Contrast  Result Date: 06/28/2022 CLINICAL DATA:  Neck trauma (Age >= 65y) EXAM: CT CERVICAL SPINE WITHOUT CONTRAST TECHNIQUE: Multidetector CT imaging of the cervical spine was performed without intravenous contrast. Multiplanar CT image reconstructions were also generated. RADIATION DOSE REDUCTION: This exam was performed according to the departmental dose-optimization program which includes automated exposure control, adjustment of the mA and/or kV according to patient size and/or use of iterative reconstruction technique. COMPARISON:  Cervical spine CT 09/26/2021 FINDINGS: Alignment: Straightening of the cervical  lordosis likely due to patient positioning. Unchanged trace degenerative anterolisthesis at C2-C3 and C3-C4. Skull base and vertebrae: There is no evidence of acute cervical spine fracture. Soft tissues and spinal canal: No prevertebral fluid or swelling. No visible canal hematoma. Disc levels: There is multilevel degenerative disc disease and facet arthropathy, with disc bulging and uncovertebral joint hypertrophy most prominent from C4-C7. Varying degrees of mild spinal canal and neural foraminal narrowing, worst at C4-C5 and C5-C6, similar to prior exam. There is progressive right-sided facet arthritis at C3-C4 with probable joint effusion some articular surface irregularity (series 6, image 20). Upper chest: Negative. Other: None. IMPRESSION: No evidence of acute cervical spine fracture. Multilevel degenerative  disc disease and facet arthropathy. 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 Recommend with inflammatory markers, as infectious facet arthritis could have a similar appearance. Electronically Signed   By: Maurine Simmering M.D.   On: 06/28/2022 18:31   CT HEAD WO CONTRAST (5MM)  Result Date: 06/28/2022 CLINICAL DATA:  Head trauma, minor (Age >= 65y).  Fall. EXAM: CT HEAD WITHOUT CONTRAST TECHNIQUE: Contiguous axial images were obtained from the base of the skull through the vertex without intravenous contrast. RADIATION DOSE REDUCTION: This exam was performed according to the departmental dose-optimization program which includes automated exposure control, adjustment of the mA and/or kV according to patient size and/or use of iterative reconstruction technique. 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 No definite acute intracranial hemorrhage, acute infarct, mass, or midline shift is identified. There is mild cerebral atrophy. Vascular: Calcified atherosclerosis at the skull base. No hyperdense vessel. Skull: No acute fracture or suspicious osseous lesion. Sinuses/Orbits: Visualized paranasal sinuses and mastoid air cells are clear. Right cataract extraction. Other: None. IMPRESSION: 1. No evidence of acute intracranial abnormality. 2. 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 Electronically Signed   By: Logan Bores M.D.   On: 06/28/2022 18:26   US Venous Img Lower Bilateral  Result Date: 06/28/2022 CLINICAL DATA:  Bilateral lower extremity edema. EXAM: BILATERAL LOWER  EXTREMITY VENOUS DOPPLER ULTRASOUND TECHNIQUE: Gray-scale sonography with compression, as well as color and duplex ultrasound, were performed to evaluate the deep venous system(s) from the level of the common femoral vein through the popliteal and proximal calf veins. COMPARISON:  None Available. FINDINGS: VENOUS Normal compressibility of the common femoral, superficial femoral, and popliteal veins, as well as the visualized calf veins. Visualized portions of profunda femoral vein and great saphenous vein unremarkable. No filling defects to suggest DVT on grayscale or color Doppler imaging. Doppler waveforms show normal direction of venous flow, normal respiratory plasticity and response to augmentation. OTHER Marked subcutaneous edema distal thighs and calves. Limitations: none IMPRESSION: 1. No evidence of bilateral lower extremity DVT. 2. Marked subcutaneous edema distal thighs and calves. Electronically Signed   By: Keane Police D.O.   On: 06/28/2022 16:51     PROCEDURES:  Critical Care performed: No  Procedures   MEDICATIONS ORDERED IN ED: Medications - No data to display   IMPRESSION / MDM / [place] / ED COURSE  I reviewed the triage vital signs and the nursing notes.  Differential diagnosis includes, but is not limited to, SDH, closed head injury, concussion, cranial fracture, cervical fracture, cervical radiculopathy  Patient's presentation is most consistent with acute complicated illness / injury requiring diagnostic workup.  Patient's diagnosis is consistent with mechanical fall resulting in minor head injury.  Patient with reassuring exam overall no acute neuromuscular episodes at this time.  No reports of any significant ongoing pain.  CT imaging of the head and neck are overall reassuring without any signs of acute intracranial process.  The CT of the neck however, did reveal some evidence of worsening right-sided facet arthropathy versus  early indication of a possible infectious facet arthritis.  Patient without any reports of fever, chills, malaise.  No acute or preceding neck pain reported.  Patient is adamant about leaving the ED at this time, but did agree to allow for blood draw of inflammatory markers at this time.  Patient will be advised to take OTC Tylenol or ibuprofen as needed for pain relief.  Patient is to follow up with primary provider or specialist for further evaluation of incidental CT findings. Patient is given ED precautions to return to the ED for any worsening or new symptoms.  Clinical Course as of 06/29/22 1530  Fri Jun 29, 2022  1510 C-reactive protein [JM]    Clinical Course User Index [JM] Tyrome Donatelli, Dannielle Karvonen, PA-C    FINAL CLINICAL IMPRESSION(S) / ED DIAGNOSES   Final diagnoses:  Fall, initial encounter  Contusion of other part of head, initial encounter  Minor head injury, initial encounter     Rx / DC Orders   ED Discharge Orders          Ordered    mupirocin ointment (BACTROBAN) 2 %        06/28/22 1906             Note:  This document was prepared using Dragon voice recognition software and may include unintentional dictation errors.    Melvenia Needles, PA-C 06/29/22 1530    Harvest Dark, MD 06/29/22 2124

## 2022-06-28 NOTE — Assessment & Plan Note (Signed)
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  Labs are reviewed and discussed with patient. Proceed with Daratumumab treatment

## 2022-06-28 NOTE — Assessment & Plan Note (Signed)
Right worse than left. This is likely due to generalized anasarca, malnutrition, hypoalbuminemia We will check bilateral lower extremity ultrasound to rule out DVT.

## 2022-06-28 NOTE — Assessment & Plan Note (Signed)
Hemoglobin is stable. Monitor.  

## 2022-06-28 NOTE — Patient Instructions (Signed)
[facility]  Discharge Instructions: Thank you for choosing [facility] to provide your oncology and hematology care.  If you have a lab appointment with the Cancer Center, please go directly to the Cancer Center and check in at the registration area.  Wear comfortable clothing and clothing appropriate for easy access to any Portacath or PICC line.   We strive to give you quality time with your provider. You may need to reschedule your appointment if you arrive late (15 or more minutes).  Arriving late affects you and other patients whose appointments are after yours.  Also, if you miss three or more appointments without notifying the office, you may be dismissed from the clinic at the provider's discretion.      For prescription refill requests, have your pharmacy contact our office and allow 72 hours for refills to be completed.    Today you received the following chemotherapy and/or immunotherapy agents Darzalex Faspro      To help prevent nausea and vomiting after your treatment, we encourage you to take your nausea medication as directed.  BELOW ARE SYMPTOMS THAT SHOULD BE REPORTED IMMEDIATELY: *FEVER GREATER THAN 100.4 F (38 C) OR HIGHER *CHILLS OR SWEATING *NAUSEA AND VOMITING THAT IS NOT CONTROLLED WITH YOUR NAUSEA MEDICATION *UNUSUAL SHORTNESS OF BREATH *UNUSUAL BRUISING OR BLEEDING *URINARY PROBLEMS (pain or burning when urinating, or frequent urination) *BOWEL PROBLEMS (unusual diarrhea, constipation, pain near the anus) TENDERNESS IN MOUTH AND THROAT WITH OR WITHOUT PRESENCE OF ULCERS (sore throat, sores in mouth, or a toothache) UNUSUAL RASH, SWELLING OR PAIN  UNUSUAL VAGINAL DISCHARGE OR ITCHING   Items with * indicate a potential emergency and should be followed up as soon as possible or go to the Emergency Department if any problems should occur.  Please show the CHEMOTHERAPY ALERT CARD or IMMUNOTHERAPY ALERT CARD at  check-in to the Emergency Department and triage nurse.  Should you have questions after your visit or need to cancel or reschedule your appointment, please contact [facility]  [phone] and follow the prompts.  Office hours are 8:00 a.m. to 4:30 p.m. Monday - Friday. Please note that voicemails left after 4:00 p.m. may not be returned until the following business day.  We are closed weekends and major holidays. You have access to a nurse at all times for urgent questions. Please call the main number to the clinic [phone] and follow the prompts.  For any non-urgent questions, you may also contact your provider using MyChart. We now offer e-Visits for anyone 18 and older to request care online for non-urgent symptoms. For details visit [email or web address].   Also download the MyChart app! Go to the app store, search "MyChart", open the app, select [facility], and log in with your MyChart username and password.    

## 2022-06-28 NOTE — Assessment & Plan Note (Signed)
Patient declines paracentesis

## 2022-06-28 NOTE — Progress Notes (Signed)
Weight increased to 146lbs 13.2 oz. Swelling noted in bilateral lower legs, but worse in right leg. Pt reports " I drank a lot of fluids yesterday and forgot to prop up my feet last night".  B/P 175/92 Recheck B/P 179/85 pt reports that she has not taken her B/P medications yet today, but has it with her and will take it now. Denies any s/s.  Pt has received Singulair with past treatment, not ordered for today.  MD made aware. Per Dr. Tasia Catchings no Singulair needed today, proceed with Darzalex as ordered, and MD will see pt in infusion to evaluate legs.   After assessment pt to have STAT US per Dr. Tasia Catchings. Pt aware of appt.   1204: Pt stable at discharge.

## 2022-06-28 NOTE — Discharge Instructions (Addendum)
Your exam and CT scans are normal and reassuring at this time but no signs of a serious head injury related to your fall.  Your CT scan of the neck reveals some progressive inflammatory arthritis.  Follow-up with your primary provider or cancer specialist for ongoing evaluation related to your CT scan.

## 2022-06-28 NOTE — Assessment & Plan Note (Signed)
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  Patient declines. Liver function stable PT/INR PTT normal. Continue Furosemide, Spirolactone, follow up with Hepatology Dr. Dorris Fetch.

## 2022-06-28 NOTE — Progress Notes (Signed)
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  Labs are reviewed and discussed with patient. 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  Patient declines. Liver function stable PT/INR PTT normal. Continue Furosemide, Spirolactone, follow up with Hepatology Dr. Dorris Fetch.   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HCC) Hemoglobin is stable. Monitor  Ascites Patient declines paracentesis  Encounter for antineoplastic chemotherapy Treatment plan as listed above  Swelling of lower extremity Right worse than left. This is likely due to generalized anasarca, malnutrition, hypoalbuminemia We will check bilateral lower extremity ultrasound to rule out DVT.   Orders Placed This Encounter  Procedures   US Venous Img Lower Bilateral    Standing Status:   Future    Number of Occurrences:   1    Standing Expiration Date:   06/28/2023    Order Specific Question:   Reason for Exam (SYMPTOM  OR DIAGNOSIS REQUIRED)    Answer:   bilat leg swelling    Order Specific Question:   Preferred imaging location?    Answer:   [place]   Follow up  per LOS  All questions were answered. The patient knows to call the clinic with any problems, questions or concerns.  [name], MD, PhD [facility] Hematology Oncology 06/28/2022   HISTORY OF PRESENTING ILLNESS:   Nancy Rodriguez is a  77 y.o.  female presents for follow up of amyloidosis.   Reviewed her previous medical records via care everywhere.  She was seen by Hematology Oncology at [facility] on 02/14/2017.  07/25/2007  SPEP showed M protein of 0.35, IFE  showed IgG lamda 09/22/2007  Bone survey negative.  02/15/14 IgG 930, SPEP M protien 0.22, free kappa light chain 3.59, lamda 2.92, ratio 1.23  Hypercalcemia, resolved after stopping HCTZ.  # Transaminitis 03/28/20 US abdomen showed left liver lobe Complex 1.7 cm cyst  # 04/07/20 MRI abdomen w/wo contrast showed 2 benign liver cysts, 2 nonspecific hypovascular irighr liver lesions, abnormal bone marrow signal of L1 vertebral body. Patient was  advised to proceed with bone marrow biopsy and she declined.  # referred her to established care with GI and was seen on 04/19/20,  # Liver biopsy showed amyloidosis. Bureau MS/MS showed AL type # 05/20/20 recommend bone marrow biopsy which is scheduled on 05/24/2020.  Patient changed her mind and ask a biopsy to be canceled.  My team and I had multiple phone discussion with patient's daughter [name] and later with [name].  Patient agreed with bone marrow biopsy and a biopsy was scheduled on 06/09/2020.  05/20/20 NT proBNP  was normal,  TSH slightly increased, normal T4 Normal factor X Normal coags Troponin 19. Refer to cardiology for evaluation. May need cardiac MRI  # 06/01/2020 2D echo result was reviewed and discussed with patient.  Patient has normal LVEF 60-65%. Mild asymmetric left ventricular hypertrophy.  Left ventricular diastolic parameters are indeterminate. average left ventricular global longitudinal strain is -16.8 %.  # 06/09/2020, bone marrow biopsy showed monoclonal plasmacytosis, 8%, amyloid deposit present.  Absent iron stores. 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 t (14;20) #06/17/2020, further discussed about diagnosis and treatment plan.  Patient  declined bone marrow transplant evaluation.  Decision was made to proceed with Dara-CyborD chemotherapy treatments. #May 2022, second opinion at [place] with Dr. Tracey Harries.who agrees with current treatment plan.  He recommends to lower dexamethasone  to 20 mg and decrease premed Tylenol to 325 mg  09/01/2020 cardiac MRI was performed at [facility].  Left ventricle is normal in size.  Mild to moderate basal septal hypertrophy.  Hyper dynamic LV function.  LVEF 71%.  Right ventricle is normal in size and wall thickness.  Systolic function normal.  Mild bi atrial enlargement, no significant aortic valve stenosis or regurgitation.  Trivial mitral regurgitation, mild tricuspid regurgitation and a trivial pulmonic regurgitation.  No evidence of MI, scarring or infiltration.  #History of major depression listed in outside problem list.  previous diagnosis and treatment details are not available to me in EMR  I have referred her to psychiatrist  #  vaginal bleeding which stopped- she was seen by GYN and recommended for biopsy.   # She has poor IV access and was not able to receive IV Emend and IV Aloxi. She does not want to have med port.   # GERD   Dr. Ola Spurr has increased her omeprazole to 40 mg daily. She feels symptoms are improved.  # 10/08/2020, cervical spine showed cervical spondylosis.  Spinal canal diagnosis and a posterior disc osteophyte complex and superimposed small central disc protrusion contact the ventral spinal cord at C4-C5.  Multilevel neuroforaminal narrowing. bilateral atlantooccipital joint effusions, multilevel grade 1 spondylolisthesis. MRI lumbar spine without contrast showed acute or subacute fracture of L4.   Moderate spinal stenosis at L3-4 has progressed. Shallow left foraminal disc protrusion has progressed. Moderate subarticular stenosis on the left also with progression  Moderate spinal stenosis L4-5 with progression. Moderate subarticular stenosis on the left with progression  # 10/25/2020 seen by [place] who recommend patient to switch Dara CyBorD to Dara RD  # 11/03/2020 Revlimid (lenalidomide) 10 mg by mouth daily for 14 days, then hold for 7 days. Pt was started on a shortened cycle in order to remain on  similar schedule with infusion.   # 11/22/2020 seen by hepatologist Dr.Kappus Rodman Key. Recommended to start Zoloft '50mg'$  daily for pruritis # 11/24/2020 cycle 2 Revlimid 10 mg, she finished 14 days.  Rest of the course was held due to anemia.  Patient declined blood transfusion.  # Vaginal bleeding  patient was seen by Dr. Leafy Ro,  on megace '40mg'$  daily. 12/22/2020 repeat EMBx - not diagnostic due to insufficient squamous component.   03/03/2021, ultrasound abdomen complete showed possible hypoechoic mass in the pancreatic tail.  1.8 x 1.2 x 1.4 cm.  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  Cholelithiasis.  MRI has been ordered for further evaluation and is scheduled.  + Falls and presented to [facility]  Patient was found to have marked hyperglycemia, AKI, UTI. 05/03/2021, CT head showed chronic appearing subdural hematoma, measuring up to 1.6 on the left and 0.9 on the right. 05/18/2021, patient was seen by cardiology Dr.End, patient was recommended to continue torsemide 20 mg daily as needed for edema.  Patient was recommended to discontinue aspirin 81 mg due to frequent falls and chronic subdural hematoma.  Patient has not had further falls.  She has home physical therapy  + Chronic diarrhea, GI Dr.Kappus recommended endoscopy evaluation and she declined.  INTERVAL HISTORY Nancy Rodriguez is a 77 y.o. female who has above history reviewed by me today presents for follow  up visit for management of AL amyloidosis Patient was accompanied by her daughter today.  Patient is in the infusion center for Daratumumab injection.  Daughter reports that patient has increased bilateral lower extremity swelling, right worse than left. Patient follows up with [facility] oncology and hepatology. Denies any abdominal pressure or pain.  Denies fever, nausea, vomiting. She tolerates Daratumumab. Appetite is good, has gained weight.  + chronic diarrhea, 3-4 times per day.  .    Review of  Systems  Constitutional:  Positive for fatigue. Negative for appetite change, chills, fever and unexpected weight change.  HENT:   Negative for hearing loss, nosebleeds and voice change.   Eyes:  Negative for eye problems and icterus.  Respiratory:  Negative for chest tightness and cough.   Cardiovascular:  Positive for leg swelling. Negative for chest pain.  Gastrointestinal:  Positive for abdominal distention and diarrhea. Negative for abdominal pain, blood in stool and nausea.  Endocrine: Negative for hot flashes.  Genitourinary:  Negative for difficulty urinating and frequency.   Musculoskeletal:  Positive for back pain. Negative for arthralgias.  Skin:  Negative for itching and rash.  Neurological:  Positive for numbness. Negative for extremity weakness.  Hematological:  Negative for adenopathy.  Psychiatric/Behavioral:  Negative for confusion.     MEDICAL HISTORY:  Past Medical History:  Diagnosis Date   (HFpEF) heart failure with preserved ejection fraction ([place])    a. 05/2020 Echo: EF 60-65%; b. 08/2020 cMRI ([facility]): EF 71%, no delayed hyperenhancement to suggest scar/infiltrative dzs. Nl RV fxn. Mild BAE. Triv MR, mild TR; c. 06/2021 Echo: EF 50-55%, no rwma, mild basal-septal LVH, GrII DD. Nl RV size/fxn. Mild MR. Ao sclerosis.   AL amyloidosis ([place])    Anemia due to chronic kidney disease    Anemia in chronic kidney disease   CKD (chronic kidney disease), stage III (HCC)    Diabetes mellitus without complication ([place])    History of cardiovascular stress test    a. 03/2012 Stress Echo([facility]): nl study.   Hypercholesterolemia    Hypertension    MGUS (monoclonal gammopathy of unknown significance)    Osteoarthritis    Rheumatoid arthritis ([place])     SURGICAL HISTORY: Past Surgical History:  Procedure Laterality Date   HERNIA REPAIR     INTRAMEDULLARY (IM) NAIL INTERTROCHANTERIC Right 09/26/2021   Procedure: INTRAMEDULLARY (IM) NAIL INTERTROCHANTRIC;  Surgeon: Thornton Park, MD;  Location: [facility] ORS;  Service: Orthopedics;  Laterality: Right;   PARATHYROIDECTOMY      SOCIAL HISTORY: Social History   Socioeconomic History   Marital status: Widowed    Spouse name: Not on file   Number of children: 6   Years of education: Not on file   Highest education level: Not on file  Occupational History   Not on file  Tobacco Use   Smoking status: Never   Smokeless tobacco: Never  Vaping Use   Vaping Use: Never used  Substance and Sexual Activity   Alcohol use: No   Drug use: Never   Sexual activity: Not on file  Other Topics Concern   Not on file  Social History Narrative   Not on file   Social Determinants of Health   Financial Resource Strain: Not on file  Food Insecurity: Not on file  Transportation Needs: Not on file  Physical Activity: Not on file  Stress: Not on file  Social Connections: Not on file  Intimate Partner Violence: Not on file    FAMILY HISTORY: Family History  Problem Relation Age of Onset   Diabetes Daughter    Diabetes Son    Dementia Mother    Cancer Father    Esophageal cancer Sister    Brain cancer Brother     ALLERGIES:  is allergic to benazepril, lisinopril, tolmetin, and nsaids.  MEDICATIONS:  Current Outpatient Medications  Medication Sig Dispense Refill   acyclovir (ZOVIRAX) 400 MG tablet Take 1 tablet (400 mg total) by mouth 2 (two) times daily. 60 tablet 2   albuterol (VENTOLIN HFA) 108 (90 Base) MCG/ACT inhaler Inhale 2 puffs into the lungs every 4 (four) hours as needed for wheezing or shortness of breath.     amLODipine (NORVASC) 5 MG tablet Take 5 mg by mouth daily.     EPINEPHrine 0.3 mg/0.3 mL IJ SOAJ injection Inject 0.3 mg into the muscle as needed. (Patient not taking: Reported on 06/21/2022)     ferrous sulfate 325 (65 FE) MG tablet Take 325 mg by mouth 2 (two) times daily with a meal.     gabapentin (NEURONTIN) 100 MG capsule Take 100 mg by mouth at bedtime.     loperamide (IMODIUM) 2 MG  capsule Take 1 capsule (2 mg total) by mouth See admin instructions. Initial: 4 mg, followed by 2 mg after each loose stool, maximum 16 mg within 24 hours. 60 capsule 0   metoprolol succinate (TOPROL-XL) 50 MG 24 hr tablet Take 50 mg by mouth daily.     mupirocin ointment (BACTROBAN) 2 % Apply to affected area 3 times daily 22 g 0   omeprazole (PRILOSEC) 20 MG capsule Take 20 mg by mouth 2 (two) times daily.     polyethylene glycol (MIRALAX / GLYCOLAX) 17 g packet Take 17 g by mouth daily as needed for mild constipation. 14 each 0   sertraline (ZOLOFT) 25 MG tablet Take 50 mg by mouth daily. (Patient not taking: Reported on 06/21/2022)     spironolactone (ALDACTONE) 100 MG tablet Take 200 mg by mouth daily.     torsemide (DEMADEX) 20 MG tablet Take 20 mg by mouth daily as needed.     traMADol (ULTRAM) 50 MG tablet Take 1 tablet (50 mg total) by mouth every 12 (twelve) hours as needed for moderate pain. 10 tablet 0   No current facility-administered medications for this visit.     PHYSICAL EXAMINATION: ECOG PERFORMANCE STATUS: 2 - Symptomatic, <50% confined to bed Vitals:   06/28/22 1110  BP: (!) 175/92  Pulse: 98  Resp: 18  Temp: (!) 96.7 F (35.9 C)   Filed Weights   06/28/22 1110  Weight: 146 lb 13 oz (66.6 kg)    Physical Exam Constitutional:      General: She is not in acute distress.    Appearance: She is ill-appearing.  HENT:     Head: Normocephalic and atraumatic.  Eyes:     General: No scleral icterus. Cardiovascular:     Rate and Rhythm: Normal rate and regular rhythm.     Heart sounds: Murmur heard.  Pulmonary:     Effort: Pulmonary effort is normal. No respiratory distress.     Breath sounds: No wheezing.  Abdominal:     General: Bowel sounds are normal. There is distension.     Palpations: Abdomen is soft.  Musculoskeletal:        General: No deformity. Normal range of motion.     Cervical back: Normal range of motion and neck supple.     Right lower leg:  Edema present.  Left lower leg: Edema present.  Skin:    General: Skin is warm and dry.     Findings: No erythema or rash.     Comments: Several subcentimeter subcutaneous firm nodules on abdomen wall  Neurological:     Mental Status: She is alert. Mental status is at baseline.     Cranial Nerves: No cranial nerve deficit.     Coordination: Coordination normal.  Psychiatric:        Mood and Affect: Mood normal.     LABORATORY DATA:  I have reviewed the data as listed    Latest Ref Rng & Units 06/28/2022    7:33 PM 06/28/2022   10:09 AM 06/21/2022    9:47 AM  CBC  WBC 4.0 - 10.5 K/uL 6.7  8.3  9.7   Hemoglobin 12.0 - 15.0 g/dL 11.2  10.7  10.1   Hematocrit 36.0 - 46.0 % 32.6  30.8  28.8   Platelets 150 - 400 K/uL 440  429  391       Latest Ref Rng & Units 06/21/2022    9:47 AM 06/06/2022    8:41 AM 05/11/2022    9:59 AM  CMP  Glucose 70 - 99 mg/dL 244  212  150   BUN 8 - 23 mg/dL 33  34  34   Creatinine 0.44 - 1.00 mg/dL 1.42  1.43  1.35   Sodium 135 - 145 mmol/L 140  140  140   Potassium 3.5 - 5.1 mmol/L 3.3  3.2  3.2   Chloride 98 - 111 mmol/L 107  108  104   CO2 22 - 32 mmol/L '23  22  24   '$ Calcium 8.9 - 10.3 mg/dL 8.5  8.7  8.8   Total Protein 6.5 - 8.1 g/dL 6.4  6.4  6.6   Total Bilirubin 0.3 - 1.2 mg/dL 2.2  2.4  3.3   Alkaline Phos 38 - 126 U/L 346  419  459   AST 15 - 41 U/L 30  39  43   ALT 0 - 44 U/L '23  24  23      '$ Iron/TIBC/Ferritin/ %Sat    Component Value Date/Time   IRON 46 06/21/2022 0947   TIBC 211 (L) 06/21/2022 0947   FERRITIN 429 (H) 06/21/2022 0947   IRONPCTSAT 22 06/21/2022 0947     08/25/2019, platelet count 491, WBC 7.5, hemoglobin 12 Creatinine 1.58, EGFR 37, calcium 10.8, albumin 4.2 Negative hepatitis B surface antigen, hepatitis B core antibody, Negative hepatitis C 08/05/2019, A1c 11.2   RADIOGRAPHIC STUDIES: I have personally reviewed the radiological images as listed and agreed with the findings in the report. CT Cervical Spine  Wo Contrast  Result Date: 06/28/2022 CLINICAL DATA:  Neck trauma (Age >= 65y) EXAM: CT CERVICAL SPINE WITHOUT CONTRAST TECHNIQUE: Multidetector CT imaging of the cervical spine was performed without intravenous contrast. Multiplanar CT image reconstructions were also generated. RADIATION DOSE REDUCTION: This exam was performed according to the departmental dose-optimization program which includes automated exposure control, adjustment of the mA and/or kV according to patient size and/or use of iterative reconstruction technique. COMPARISON:  Cervical spine CT 09/26/2021 FINDINGS: Alignment: Straightening of the cervical lordosis likely due to patient positioning. Unchanged trace degenerative anterolisthesis at C2-C3 and C3-C4. Skull base and vertebrae: There is no evidence of acute cervical spine fracture. Soft tissues and spinal canal: No prevertebral fluid or swelling. No visible canal hematoma. Disc levels: There is multilevel degenerative disc disease and facet arthropathy, with  disc bulging and uncovertebral joint hypertrophy most prominent from C4-C7. Varying degrees of mild spinal canal and neural foraminal narrowing, worst at C4-C5 and C5-C6, similar to prior exam. There is progressive right-sided facet arthritis at C3-C4 with probable joint effusion some articular surface irregularity (series 6, image 20). Upper chest: Negative. Other: None. IMPRESSION: No evidence of acute cervical spine fracture. Multilevel degenerative disc disease and facet arthropathy. 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 Recommend with inflammatory markers, as infectious facet arthritis could have a similar appearance. Electronically Signed   By: Maurine Simmering M.D.   On: 06/28/2022 18:31   CT HEAD WO CONTRAST (5MM)  Result Date: 06/28/2022 CLINICAL DATA:  Head trauma, minor (Age >= 65y).  Fall. EXAM: CT HEAD WITHOUT CONTRAST  TECHNIQUE: Contiguous axial images were obtained from the base of the skull through the vertex without intravenous contrast. RADIATION DOSE REDUCTION: This exam was performed according to the departmental dose-optimization program which includes automated exposure control, adjustment of the mA and/or kV according to patient size and/or use of iterative reconstruction technique. 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 No definite acute intracranial hemorrhage, acute infarct, mass, or midline shift is identified. There is mild cerebral atrophy. Vascular: Calcified atherosclerosis at the skull base. No hyperdense vessel. Skull: No acute fracture or suspicious osseous lesion. Sinuses/Orbits: Visualized paranasal sinuses and mastoid air cells are clear. Right cataract extraction. Other: None. IMPRESSION: 1. No evidence of acute intracranial abnormality. 2. 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 Electronically Signed   By: Logan Bores M.D.   On: 06/28/2022 18:26   US Venous Img Lower Bilateral  Result Date: 06/28/2022 CLINICAL DATA:  Bilateral lower extremity edema. EXAM: BILATERAL LOWER EXTREMITY VENOUS DOPPLER ULTRASOUND TECHNIQUE: Gray-scale sonography with compression, as well as color and duplex ultrasound, were performed to evaluate the deep venous system(s) from the level of the common femoral vein through the popliteal and proximal calf veins. COMPARISON:  None Available. FINDINGS: VENOUS Normal compressibility of the common femoral, superficial femoral, and popliteal veins, as well as the visualized calf veins. Visualized portions of profunda femoral vein and great  saphenous vein unremarkable. No filling defects to suggest DVT on grayscale or color Doppler imaging. Doppler waveforms show normal direction of venous flow, normal respiratory plasticity and response to augmentation. OTHER Marked subcutaneous edema distal thighs and calves. Limitations: none IMPRESSION: 1. No evidence of bilateral lower extremity DVT. 2. Marked subcutaneous edema distal thighs and calves. Electronically Signed   By: Keane Police D.O.   On: 06/28/2022 16:51

## 2022-06-28 NOTE — ED Triage Notes (Signed)
Pt in via POV. Pt was reported to be walking out the doctors office and was walking too fast and when grabbing the walker and she stumbled over hitting the back of her head. Pt denies being on blood thinners, dizziness, lightheadedness.

## 2022-06-28 NOTE — Progress Notes (Signed)
Responded to security alert in lobby. Patient sitting in wheelchair. Was told she had gotten an ultrasound, came out to lobby, pt states tangled her feet in her walker and lost balance. Fell and hit floor and hit head. No loss of consciousness. Pt seems awake alert oriented. No obvious bleeding or injuries noted.Pt agreed to be seen in the ED since she hit her head. Pt's daughter with her. Taken to triage in wheelchair. Pt seems awake alert oriented. Moves all extremities.

## 2022-07-05 ENCOUNTER — Inpatient Hospital Stay: Payer: Medicare Other

## 2022-07-05 ENCOUNTER — Inpatient Hospital Stay (HOSPITAL_BASED_OUTPATIENT_CLINIC_OR_DEPARTMENT_OTHER): Payer: Medicare Other | Admitting: Oncology

## 2022-07-05 ENCOUNTER — Encounter: Payer: Self-pay | Admitting: Oncology

## 2022-07-05 VITALS — BP 145/69 | HR 79 | Temp 96.7°F | Resp 17 | Wt 145.3 lb

## 2022-07-05 DIAGNOSIS — Z5111 Encounter for antineoplastic chemotherapy: Secondary | ICD-10-CM

## 2022-07-05 DIAGNOSIS — E854 Organ-limited amyloidosis: Secondary | ICD-10-CM | POA: Diagnosis not present

## 2022-07-05 DIAGNOSIS — E876 Hypokalemia: Secondary | ICD-10-CM

## 2022-07-05 DIAGNOSIS — N1831 Chronic kidney disease, stage 3a: Secondary | ICD-10-CM | POA: Diagnosis not present

## 2022-07-05 DIAGNOSIS — E859 Amyloidosis, unspecified: Secondary | ICD-10-CM

## 2022-07-05 DIAGNOSIS — Z7189 Other specified counseling: Secondary | ICD-10-CM

## 2022-07-05 DIAGNOSIS — R188 Other ascites: Secondary | ICD-10-CM

## 2022-07-05 DIAGNOSIS — D631 Anemia in chronic kidney disease: Secondary | ICD-10-CM

## 2022-07-05 DIAGNOSIS — K77 Liver disorders in diseases classified elsewhere: Secondary | ICD-10-CM

## 2022-07-05 DIAGNOSIS — M7989 Other specified soft tissue disorders: Secondary | ICD-10-CM

## 2022-07-05 LAB — COMPREHENSIVE METABOLIC PANEL
ALT: 26 U/L (ref 0–44)
AST: 39 U/L (ref 15–41)
Albumin: 3.1 g/dL — ABNORMAL LOW (ref 3.5–5.0)
Alkaline Phosphatase: 368 U/L — ABNORMAL HIGH (ref 38–126)
Anion gap: 8 (ref 5–15)
BUN: 29 mg/dL — ABNORMAL HIGH (ref 8–23)
CO2: 25 mmol/L (ref 22–32)
Calcium: 8.7 mg/dL — ABNORMAL LOW (ref 8.9–10.3)
Chloride: 105 mmol/L (ref 98–111)
Creatinine, Ser: 1.34 mg/dL — ABNORMAL HIGH (ref 0.44–1.00)
GFR, Estimated: 41 mL/min — ABNORMAL LOW (ref >60)
Glucose, Bld: 184 mg/dL — ABNORMAL HIGH (ref 70–99)
Potassium: 3.3 mmol/L — ABNORMAL LOW (ref 3.5–5.1)
Sodium: 138 mmol/L (ref 135–145)
Total Bilirubin: 3.1 mg/dL — ABNORMAL HIGH (ref 0.3–1.2)
Total Protein: 6.6 g/dL (ref 6.5–8.1)

## 2022-07-05 LAB — CBC WITH DIFFERENTIAL/PLATELET
Abs Immature Granulocytes: 0.06 10*3/uL (ref 0.00–0.07)
Basophils Absolute: 0 10*3/uL (ref 0.0–0.1)
Basophils Relative: 0 %
Eosinophils Absolute: 0.1 10*3/uL (ref 0.0–0.5)
Eosinophils Relative: 1 %
HCT: 29.3 % — ABNORMAL LOW (ref 36.0–46.0)
Hemoglobin: 10.3 g/dL — ABNORMAL LOW (ref 12.0–15.0)
Immature Granulocytes: 1 %
Lymphocytes Relative: 19 %
Lymphs Abs: 1.3 10*3/uL (ref 0.7–4.0)
MCH: 27.6 pg (ref 26.0–34.0)
MCHC: 35.2 g/dL (ref 30.0–36.0)
MCV: 78.6 fL — ABNORMAL LOW (ref 80.0–100.0)
Monocytes Absolute: 0.6 10*3/uL (ref 0.1–1.0)
Monocytes Relative: 8 %
Neutro Abs: 4.8 10*3/uL (ref 1.7–7.7)
Neutrophils Relative %: 71 %
Platelets: 438 10*3/uL — ABNORMAL HIGH (ref 150–400)
RBC: 3.73 MIL/uL — ABNORMAL LOW (ref 3.87–5.11)
RDW: 24 % — ABNORMAL HIGH (ref 11.5–15.5)
WBC: 6.8 10*3/uL (ref 4.0–10.5)
nRBC: 0 % (ref 0.0–0.2)

## 2022-07-05 MED ORDER — DIPHENHYDRAMINE HCL 25 MG PO CAPS
50.0000 mg | ORAL_CAPSULE | Freq: Once | ORAL | Status: AC
  Administered 2022-07-05: 50 mg via ORAL
  Filled 2022-07-05: qty 2

## 2022-07-05 MED ORDER — DEXAMETHASONE 4 MG PO TABS
20.0000 mg | ORAL_TABLET | Freq: Once | ORAL | Status: AC
  Administered 2022-07-05: 20 mg via ORAL
  Filled 2022-07-05: qty 5

## 2022-07-05 MED ORDER — DARATUMUMAB-HYALURONIDASE-FIHJ 1800-30000 MG-UT/15ML ~~LOC~~ SOLN
1800.0000 mg | Freq: Once | SUBCUTANEOUS | Status: AC
  Administered 2022-07-05: 1800 mg via SUBCUTANEOUS
  Filled 2022-07-05: qty 15

## 2022-07-05 NOTE — Progress Notes (Signed)
Pt observed for 10 minutes post faspro.

## 2022-07-05 NOTE — Assessment & Plan Note (Addendum)
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 Daratumumab treatments hopefully would slow down the progression of antibiosis but probably not going to reverse pre-existing damage from amyloidosis.  Her overall prognosis is poor.  We discussed the option of stopping treatments if she prefers to focus on quality of life. Patient and daughter appreciate explanation and patient desires further treatments. Labs are reviewed and discussed with patient. Proceed with Daratumumab treatment

## 2022-07-05 NOTE — Patient Instructions (Signed)
[facility]  Discharge Instructions: Thank you for choosing [facility] to provide your oncology and hematology care.  If you have a lab appointment with the Cancer Center, please go directly to the Cancer Center and check in at the registration area.  Wear comfortable clothing and clothing appropriate for easy access to any Portacath or PICC line.   We strive to give you quality time with your provider. You may need to reschedule your appointment if you arrive late (15 or more minutes).  Arriving late affects you and other patients whose appointments are after yours.  Also, if you miss three or more appointments without notifying the office, you may be dismissed from the clinic at the provider's discretion.      For prescription refill requests, have your pharmacy contact our office and allow 72 hours for refills to be completed.    Today you received the following chemotherapy and/or immunotherapy agents Darzalex Faspro      To help prevent nausea and vomiting after your treatment, we encourage you to take your nausea medication as directed.  BELOW ARE SYMPTOMS THAT SHOULD BE REPORTED IMMEDIATELY: *FEVER GREATER THAN 100.4 F (38 C) OR HIGHER *CHILLS OR SWEATING *NAUSEA AND VOMITING THAT IS NOT CONTROLLED WITH YOUR NAUSEA MEDICATION *UNUSUAL SHORTNESS OF BREATH *UNUSUAL BRUISING OR BLEEDING *URINARY PROBLEMS (pain or burning when urinating, or frequent urination) *BOWEL PROBLEMS (unusual diarrhea, constipation, pain near the anus) TENDERNESS IN MOUTH AND THROAT WITH OR WITHOUT PRESENCE OF ULCERS (sore throat, sores in mouth, or a toothache) UNUSUAL RASH, SWELLING OR PAIN  UNUSUAL VAGINAL DISCHARGE OR ITCHING   Items with * indicate a potential emergency and should be followed up as soon as possible or go to the Emergency Department if any problems should occur.  Please show the CHEMOTHERAPY ALERT CARD or IMMUNOTHERAPY ALERT CARD at  check-in to the Emergency Department and triage nurse.  Should you have questions after your visit or need to cancel or reschedule your appointment, please contact [facility]  [phone] and follow the prompts.  Office hours are 8:00 a.m. to 4:30 p.m. Monday - Friday. Please note that voicemails left after 4:00 p.m. may not be returned until the following business day.  We are closed weekends and major holidays. You have access to a nurse at all times for urgent questions. Please call the main number to the clinic [phone] and follow the prompts.  For any non-urgent questions, you may also contact your provider using MyChart. We now offer e-Visits for anyone 18 and older to request care online for non-urgent symptoms. For details visit [email or web address].   Also download the MyChart app! Go to the app store, search "MyChart", open the app, select [facility], and log in with your MyChart username and password.    

## 2022-07-05 NOTE — Assessment & Plan Note (Signed)
Patient declines paracentesis 

## 2022-07-05 NOTE — Assessment & Plan Note (Signed)
We reviewed goals of care today.  Patient will follow-up with palliative care service

## 2022-07-05 NOTE — Assessment & Plan Note (Signed)
Hemoglobin is stable. Monitor.  

## 2022-07-05 NOTE — Assessment & Plan Note (Signed)
Is likely secondary to chronic Lasix use.  Patient is also spironolactone.  Potassium is stable at 3.3. Chronic kidney disease.  Continue monitor.

## 2022-07-05 NOTE — Assessment & Plan Note (Signed)
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  Patient adamantly declines. Bilirubin slightly increased today. Continue Furosemide, Spirolactone, follow up with Hepatology Dr. Dorris Fetch.

## 2022-07-05 NOTE — Progress Notes (Signed)
Patient c/o  recent fall with lightheadedness and constant leg swelling

## 2022-07-05 NOTE — Assessment & Plan Note (Signed)
Treatment plan as listed above. 

## 2022-07-05 NOTE — Progress Notes (Signed)
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 Daratumumab treatments hopefully would slow down the progression of antibiosis but probably not going to reverse pre-existing damage from amyloidosis.  Her overall prognosis is poor.  We discussed the option of stopping treatments if she prefers to focus on quality of life. Patient and daughter appreciate explanation and patient desires further treatments. Labs are reviewed and discussed with patient. 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  Patient adamantly declines. Bilirubin slightly increased today. Continue Furosemide, Spirolactone, follow up with Hepatology Dr. Dorris Fetch.   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HCC) Hemoglobin is stable. Monitor  Ascites Patient declines paracentesis  Encounter for antineoplastic chemotherapy Treatment plan as listed above  Swelling of lower extremity This is likely due to generalized anasarca, portal hypertension, hypoalbuminemia  continue diuretics Negative DVT on ultrasound.  Goals of care, counseling/discussion We reviewed goals of care today.  Patient will follow-up with palliative care service  Hypokalemia Is likely secondary to chronic Lasix use.  Patient is also spironolactone.  Potassium is stable at 3.3. Chronic kidney disease.  Continue monitor.   No orders of the defined types were placed in this encounter.  Follow-up 1 week lab Dara 2 weeks lab MD Dara  All questions were answered. The patient  knows to call the clinic with any problems, questions or concerns.  [name], MD, PhD [facility] Hematology Oncology 07/05/2022   HISTORY OF PRESENTING ILLNESS:   Nancy Rodriguez is a  77 y.o.  female presents for follow up of amyloidosis.   Reviewed her previous medical records via care everywhere.  She was seen by Hematology Oncology at [facility] on 02/14/2017.  07/25/2007  SPEP showed M protein of 0.35, IFE showed IgG lamda 09/22/2007  Bone survey negative.  02/15/14 IgG 930, SPEP M protien 0.22, free kappa light chain 3.59, lamda 2.92, ratio 1.23  Hypercalcemia, resolved after stopping HCTZ.  # Transaminitis 03/28/20 US abdomen showed left liver lobe Complex 1.7 cm cyst  # 04/07/20 MRI abdomen w/wo contrast showed 2 benign liver cysts, 2 nonspecific hypovascular irighr liver lesions, abnormal bone marrow signal of L1 vertebral body. Patient was  advised to proceed with bone marrow biopsy and she declined.  # referred her to established care with GI and was seen on 04/19/20,  # Liver biopsy showed amyloidosis. [place] MS/MS showed AL type # 05/20/20 recommend bone marrow biopsy which is scheduled on 05/24/2020.  Patient changed her mind and ask a biopsy to be canceled.  My team and I had multiple phone discussion with patient's daughter [name] and later with [name].  Patient agreed with bone marrow biopsy and a biopsy was scheduled on 06/09/2020.  05/20/20 NT proBNP  was normal,  TSH slightly increased, normal T4 Normal factor X Normal coags Troponin 19. Refer to cardiology for evaluation. May need cardiac MRI  # 06/01/2020 2D echo result was reviewed and discussed with patient.  Patient has normal LVEF 60-65%. Mild asymmetric left ventricular hypertrophy.  Left ventricular diastolic parameters are indeterminate. average left ventricular global longitudinal strain is -16.8 %.  # 06/09/2020, bone marrow biopsy showed monoclonal plasmacytosis, 8%,  amyloid deposit present.  Absent iron  stores. 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 t (14;20) #06/17/2020, further discussed about diagnosis and treatment plan.  Patient declined bone marrow transplant evaluation.  Decision was made to proceed with Dara-CyborD chemotherapy treatments. #May 2022, second opinion at [place] with Dr. Tracey Harries.who agrees with current treatment plan.  He recommends to lower dexamethasone to 20 mg and decrease premed Tylenol to 325 mg  09/01/2020 cardiac MRI was performed at [facility].  Left ventricle is normal in size.  Mild to moderate basal septal hypertrophy.  Hyper dynamic LV function.  LVEF 71%.  Right ventricle is normal in size and wall thickness.  Systolic function normal.  Mild bi atrial enlargement, no significant aortic valve stenosis or regurgitation.  Trivial mitral regurgitation, mild tricuspid regurgitation and a trivial pulmonic regurgitation.  No evidence of MI, scarring or infiltration.  #History of major depression listed in outside problem list.  previous diagnosis and treatment details are not available to me in EMR  I have referred her to psychiatrist  #  vaginal bleeding which stopped- she was seen by GYN and recommended for biopsy.   # She has poor IV access and was not able to receive IV Emend and IV Aloxi. She does not want to have med port.   # GERD   Dr. Ola Spurr has increased her omeprazole to 40 mg daily. She feels symptoms are improved.  # 10/08/2020, cervical spine showed cervical spondylosis.  Spinal canal diagnosis and a posterior disc osteophyte complex and superimposed small central disc protrusion contact the ventral spinal cord at C4-C5.  Multilevel neuroforaminal narrowing. bilateral atlantooccipital joint effusions, multilevel grade 1 spondylolisthesis. MRI lumbar spine without contrast showed acute or subacute fracture of L4.   Moderate spinal stenosis at L3-4 has progressed. Shallow left foraminal disc protrusion has progressed.  Moderate subarticular stenosis on the left also with progression  Moderate spinal stenosis L4-5 with progression. Moderate subarticular stenosis on the left with progression  # 10/25/2020 seen by [facility] who recommend patient to switch Dara CyBorD to Dara RD  # 11/03/2020 Revlimid (lenalidomide) 10 mg by mouth daily for 14 days, then hold for 7 days. Pt was started on a shortened cycle in order to remain on similar schedule with infusion.   # 11/22/2020 seen by hepatologist Dr.Kappus [place]man [place]. Recommended to start Zoloft 50mg  daily for pruritis # 11/24/2020 cycle 2 Revlimid 10 mg, she finished 14 days.  Rest of the course was held due to anemia.  Patient declined blood transfusion.  # Vaginal bleeding  patient was seen by Dr. Leafy Ro,  on megace 40mg  daily. 12/22/2020 repeat EMBx - not diagnostic due to insufficient squamous component.   03/03/2021, ultrasound abdomen complete showed possible hypoechoic mass in the pancreatic tail.  1.8 x 1.2 x 1.4 cm.  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  Cholelithiasis.  MRI has been ordered for further evaluation and is scheduled.  + Falls and presented to [facility].  Patient was found to have marked hyperglycemia, AKI, UTI. 05/03/2021, CT head showed chronic appearing subdural hematoma, measuring up to 1.6 on the left and 0.9 on the right. 05/18/2021, patient was seen by cardiology Dr.End, patient was recommended to continue torsemide 20 mg daily as needed for edema.  Patient was recommended to discontinue aspirin 81 mg due to frequent falls and chronic subdural hematoma.  Patient has not had further falls.  She has home physical therapy  +  Chronic diarrhea, GI Dr.Kappus recommended endoscopy evaluation and she declined.  INTERVAL HISTORY GRACELEIGH KUBECKA is a 77 y.o. female who has above history reviewed by me today presents for follow up visit for management of AL amyloidosis Patient was accompanied by her daughter  today.   Patient continues to have bilateral lower extremity swelling/edema.  Chronic abdomen distention. Appetite is good.  Her weight is stable.  Overall she tolerates Daratumumab. She had a fall after her recent venous ultrasound appointments. + chronic diarrhea, 3-4 times per day.  .    Review of Systems  Constitutional:  Positive for fatigue. Negative for appetite change, chills, fever and unexpected weight change.  HENT:   Negative for hearing loss, nosebleeds and voice change.   Eyes:  Negative for eye problems and icterus.  Respiratory:  Negative for chest tightness and cough.   Cardiovascular:  Positive for leg swelling. Negative for chest pain.  Gastrointestinal:  Positive for abdominal distention and diarrhea. Negative for abdominal pain, blood in stool and nausea.  Endocrine: Negative for hot flashes.  Genitourinary:  Negative for difficulty urinating and frequency.   Musculoskeletal:  Positive for back pain. Negative for arthralgias.  Skin:  Negative for itching and rash.  Neurological:  Positive for numbness. Negative for extremity weakness.  Hematological:  Negative for adenopathy.  Psychiatric/Behavioral:  Negative for confusion.     MEDICAL HISTORY:  Past Medical History:  Diagnosis Date   (HFpEF) heart failure with preserved ejection fraction ([place])    a. 05/2020 Echo: EF 60-65%; b. 08/2020 cMRI ([facility]): EF 71%, no delayed hyperenhancement to suggest scar/infiltrative dzs. Nl RV fxn. Mild BAE. Triv MR, mild TR; c. 06/2021 Echo: EF 50-55%, no rwma, mild basal-septal LVH, GrII DD. Nl RV size/fxn. Mild MR. Ao sclerosis.   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 03/2012 Stress Echo([facility]): nl study.   Hypercholesterolemia    Hypertension    MGUS (monoclonal gammopathy of  unknown significance)    Osteoarthritis    Rheumatoid arthritis ([place])     SURGICAL HISTORY: Past Surgical History:  Procedure Laterality Date   HERNIA REPAIR     INTRAMEDULLARY (IM) NAIL INTERTROCHANTERIC Right 09/26/2021   Procedure: INTRAMEDULLARY (IM) NAIL INTERTROCHANTRIC;  Surgeon: Thornton Park, MD;  Location: [facility] ORS;  Service: Orthopedics;  Laterality: Right;   PARATHYROIDECTOMY      SOCIAL HISTORY: Social History   Socioeconomic History   Marital status: Widowed    Spouse name: Not on file   Number of children: 6   Years of education: Not on file   Highest education level: Not on file  Occupational History   Not on file  Tobacco Use   Smoking status: Never   Smokeless tobacco: Never  Vaping Use   Vaping Use: Never used  Substance and Sexual Activity   Alcohol use: No   Drug use: Never   Sexual activity: Not on file  Other Topics Concern   Not on file  Social History Narrative   Not on file   Social Determinants of Health   Financial Resource Strain: Not on file  Food Insecurity: Not on file  Transportation Needs: Not on file  Physical Activity: Not on file  Stress: Not on file  Social Connections: Not on file  Intimate Partner Violence: Not on file    FAMILY HISTORY: Family History  Problem Relation Age of Onset   Diabetes Daughter    Diabetes Son    Dementia Mother    Cancer Father    Esophageal cancer Sister    Brain cancer Brother     ALLERGIES:  is allergic to benazepril, lisinopril, tolmetin, and nsaids.  MEDICATIONS:  Current Outpatient Medications  Medication Sig Dispense Refill   acyclovir (ZOVIRAX) 400 MG tablet Take 1 tablet (400 mg total) by mouth 2 (two) times daily. 60 tablet 2   albuterol (VENTOLIN HFA) 108 (90 Base) MCG/ACT inhaler Inhale 2 puffs into the lungs every 4 (four) hours as needed for wheezing or shortness of breath.     amLODipine (NORVASC) 5 MG tablet Take 5 mg by mouth daily.     gabapentin (NEURONTIN) 100  MG capsule Take 100 mg by mouth at bedtime.     loperamide (IMODIUM) 2 MG capsule Take 1 capsule (2 mg total) by mouth See admin instructions. Initial: 4 mg, followed by 2 mg after each loose stool, maximum 16 mg within 24 hours. 60 capsule 0   metoprolol succinate (TOPROL-XL) 50 MG 24 hr tablet Take 50 mg by mouth daily.     mupirocin ointment (BACTROBAN) 2 % Apply to affected area 3 times daily 22 g 0   omeprazole (PRILOSEC) 20 MG capsule Take 20 mg by mouth 2 (two) times daily.     polyethylene glycol (MIRALAX / GLYCOLAX) 17 g packet Take 17 g by mouth daily as needed for mild constipation. 14 each 0   spironolactone (ALDACTONE) 100 MG tablet Take 200 mg by mouth daily.     torsemide (DEMADEX) 20 MG tablet Take 20 mg by mouth daily as needed.     traMADol (ULTRAM) 50 MG tablet Take 1 tablet (50 mg total) by mouth every 12 (twelve) hours as needed for moderate pain. 10 tablet 0   EPINEPHrine 0.3 mg/0.3 mL IJ SOAJ injection Inject 0.3 mg into the muscle as needed. (Patient not taking: Reported on 06/21/2022)     ferrous sulfate 325 (65 FE) MG tablet Take 325 mg by mouth 2 (two) times daily with a meal. (Patient not taking: Reported on 07/05/2022)     sertraline (ZOLOFT) 25 MG tablet Take 50 mg by mouth daily. (Patient not taking: Reported on 06/21/2022)     No current facility-administered medications for this visit.     PHYSICAL EXAMINATION: ECOG PERFORMANCE STATUS: 2 - Symptomatic, <50% confined to bed Vitals:   07/05/22 1357  BP: (!) 145/69  Pulse: 79  Resp: 17  Temp: (!) 96.7 F (35.9 C)  SpO2: 100%   Filed Weights   07/05/22 1357  Weight: 145 lb 4.8 oz (65.9 kg)    Physical Exam Constitutional:      General: She is not in acute distress.    Appearance: She is ill-appearing.  HENT:     Head: Normocephalic and atraumatic.  Eyes:     General: No scleral icterus. Cardiovascular:     Rate and Rhythm: Normal rate and regular rhythm.     Heart sounds: Murmur heard.  Pulmonary:      Effort: Pulmonary effort is normal. No respiratory distress.     Breath sounds: No wheezing.  Abdominal:     General: Bowel sounds are normal. There is distension.     Palpations: Abdomen is soft.  Musculoskeletal:        General: No deformity. Normal range of  motion.     Cervical back: Normal range of motion and neck supple.     Right lower leg: Edema present.     Left lower leg: Edema present.  Skin:    General: Skin is warm and dry.     Findings: No erythema or rash.     Comments: Several subcentimeter subcutaneous firm nodules on abdomen wall  Neurological:     Mental Status: She is alert. Mental status is at baseline.     Cranial Nerves: No cranial nerve deficit.     Coordination: Coordination normal.  Psychiatric:        Mood and Affect: Mood normal.     LABORATORY DATA:  I have reviewed the data as listed    Latest Ref Rng & Units 07/05/2022    1:35 PM 06/28/2022    7:33 PM 06/28/2022   10:09 AM  CBC  WBC 4.0 - 10.5 K/uL 6.8  6.7  8.3   Hemoglobin 12.0 - 15.0 g/dL 10.3  11.2  10.7   Hematocrit 36.0 - 46.0 % 29.3  32.6  30.8   Platelets 150 - 400 K/uL 438  440  429       Latest Ref Rng & Units 07/05/2022    1:35 PM 06/21/2022    9:47 AM 06/06/2022    8:41 AM  CMP  Glucose 70 - 99 mg/dL 184  244  212   BUN 8 - 23 mg/dL 29  33  34   Creatinine 0.44 - 1.00 mg/dL 1.34  1.42  1.43   Sodium 135 - 145 mmol/L 138  140  140   Potassium 3.5 - 5.1 mmol/L 3.3  3.3  3.2   Chloride 98 - 111 mmol/L 105  107  108   CO2 22 - 32 mmol/L 25  23  22    Calcium 8.9 - 10.3 mg/dL 8.7  8.5  8.7   Total Protein 6.5 - 8.1 g/dL 6.6  6.4  6.4   Total Bilirubin 0.3 - 1.2 mg/dL 3.1  2.2  2.4   Alkaline Phos 38 - 126 U/L 368  346  419   AST 15 - 41 U/L 39  30  39   ALT 0 - 44 U/L 26  23  24       Iron/TIBC/Ferritin/ %Sat    Component Value Date/Time   IRON 46 06/21/2022 0947   TIBC 211 (L) 06/21/2022 0947   FERRITIN 429 (H) 06/21/2022 0947   IRONPCTSAT 22 06/21/2022 0947      08/25/2019, platelet count 491, WBC 7.5, hemoglobin 12 Creatinine 1.58, EGFR 37, calcium 10.8, albumin 4.2 Negative hepatitis B surface antigen, hepatitis B core antibody, Negative hepatitis C 08/05/2019, A1c 11.2   RADIOGRAPHIC STUDIES: I have personally reviewed the radiological images as listed and agreed with the findings in the report. CT Cervical Spine Wo Contrast  Result Date: 06/28/2022 CLINICAL DATA:  Neck trauma (Age >= 65y) EXAM: CT CERVICAL SPINE WITHOUT CONTRAST TECHNIQUE: Multidetector CT imaging of the cervical spine was performed without intravenous contrast. Multiplanar CT image reconstructions were also generated. RADIATION DOSE REDUCTION: This exam was performed according to the departmental dose-optimization program which includes automated exposure control, adjustment of the mA and/or kV according to patient size and/or use of iterative reconstruction technique. COMPARISON:  Cervical spine CT 09/26/2021 FINDINGS: Alignment: Straightening of the cervical lordosis likely due to patient positioning. Unchanged trace degenerative anterolisthesis at C2-C3 and C3-C4. Skull base and vertebrae: There is no evidence of acute cervical spine  fracture. Soft tissues and spinal canal: No prevertebral fluid or swelling. No visible canal hematoma. Disc levels: There is multilevel degenerative disc disease and facet arthropathy, with disc bulging and uncovertebral joint hypertrophy most prominent from C4-C7. Varying degrees of mild spinal canal and neural foraminal narrowing, worst at C4-C5 and C5-C6, similar to prior exam. There is progressive right-sided facet arthritis at C3-C4 with probable joint effusion some articular surface irregularity (series 6, image 20). Upper chest: Negative. Other: None. IMPRESSION: No evidence of acute cervical spine fracture. Multilevel degenerative disc disease and facet arthropathy. Progressive, severe right-sided facet arthritis at C3-C4 in comparison to June 2023,  with probable effusion and articular surface irregularity. Recommend with inflammatory markers, as infectious facet arthritis could have a similar appearance. Electronically Signed   By: Maurine Simmering M.D.   On: 06/28/2022 18:31   CT HEAD WO CONTRAST (5MM)  Result Date: 06/28/2022 CLINICAL DATA:  Head trauma, minor (Age >= 65y).  Fall. EXAM: CT HEAD WITHOUT CONTRAST TECHNIQUE: Contiguous axial images were obtained from the base of the skull through the vertex without intravenous contrast. RADIATION DOSE REDUCTION: This exam was performed according to the departmental dose-optimization program which includes automated exposure control, adjustment of the mA and/or kV according to patient size and/or use of iterative reconstruction technique. 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 No definite acute intracranial hemorrhage, acute infarct, mass, or midline shift is identified. There is mild cerebral atrophy. Vascular: Calcified atherosclerosis at the skull base. No hyperdense vessel. Skull: No acute fracture or suspicious osseous lesion. Sinuses/Orbits: Visualized paranasal sinuses and mastoid air cells are clear. Right cataract extraction. Other: None. IMPRESSION: 1. No evidence of acute intracranial abnormality. 2. 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 Electronically Signed   By: Logan Bores M.D.   On: 06/28/2022 18:26   US Venous Img Lower Bilateral  Result Date: 06/28/2022 CLINICAL DATA:  Bilateral lower extremity edema. EXAM: BILATERAL LOWER EXTREMITY VENOUS DOPPLER ULTRASOUND TECHNIQUE: Gray-scale sonography with compression, as well as color and duplex  ultrasound, were performed to evaluate the deep venous system(s) from the level of the common femoral vein through the popliteal and proximal calf veins. COMPARISON:  None Available. FINDINGS: VENOUS Normal compressibility of the common femoral, superficial femoral, and popliteal veins, as well as the visualized calf veins. Visualized portions of profunda femoral vein and great saphenous vein unremarkable. No filling defects to suggest DVT on grayscale or color Doppler imaging. Doppler waveforms show normal direction of venous flow, normal respiratory plasticity and response to augmentation. OTHER Marked subcutaneous edema distal thighs and calves. Limitations: none IMPRESSION: 1. No evidence of bilateral lower extremity DVT. 2. Marked subcutaneous edema distal thighs and calves. Electronically Signed   By: Keane Police D.O.   On: 06/28/2022 16:51

## 2022-07-05 NOTE — Assessment & Plan Note (Addendum)
This is likely due to generalized anasarca, portal hypertension, hypoalbuminemia  continue diuretics Negative DVT on ultrasound.

## 2022-07-06 LAB — BETA 2 MICROGLOBULIN, SERUM: Beta-2 Microglobulin: 4.4 mg/L — ABNORMAL HIGH (ref 0.6–2.4)

## 2022-07-06 LAB — KAPPA/LAMBDA LIGHT CHAINS
Kappa free light chain: 13 mg/L (ref 3.3–19.4)
Kappa, lambda light chain ratio: 1.43 (ref 0.26–1.65)
Lambda free light chains: 9.1 mg/L (ref 5.7–26.3)

## 2022-07-10 ENCOUNTER — Inpatient Hospital Stay: Payer: Medicare Other | Admitting: Oncology

## 2022-07-10 ENCOUNTER — Inpatient Hospital Stay: Payer: Medicare Other | Admitting: Hospice and Palliative Medicine

## 2022-07-10 ENCOUNTER — Telehealth: Payer: Self-pay

## 2022-07-10 ENCOUNTER — Inpatient Hospital Stay: Payer: Medicare Other

## 2022-07-10 NOTE — Telephone Encounter (Signed)
Called and informed pt's daughter [name], that today's appt was not necessary as it is an old appt. She states they called and left message earlier stating that her mom was tired and would not be able to make appt.   Please cancel today's (3/26) appts and r/s palliative to another day that pt is scheduled to come to clinic.

## 2022-07-11 ENCOUNTER — Encounter: Payer: Self-pay | Admitting: Oncology

## 2022-07-11 LAB — MULTIPLE MYELOMA PANEL, SERUM
Albumin SerPl Elph-Mcnc: 3 g/dL (ref 2.9–4.4)
Albumin/Glob SerPl: 1.1 (ref 0.7–1.7)
Alpha 1: 0.4 g/dL (ref 0.0–0.4)
Alpha2 Glob SerPl Elph-Mcnc: 0.7 g/dL (ref 0.4–1.0)
B-Globulin SerPl Elph-Mcnc: 1 g/dL (ref 0.7–1.3)
Gamma Glob SerPl Elph-Mcnc: 0.7 g/dL (ref 0.4–1.8)
Globulin, Total: 2.8 g/dL (ref 2.2–3.9)
IgA: 121 mg/dL (ref 64–422)
IgG (Immunoglobin G), Serum: 781 mg/dL (ref 586–1602)
IgM (Immunoglobulin M), Srm: 39 mg/dL (ref 26–217)
M Protein SerPl Elph-Mcnc: 0.1 g/dL — ABNORMAL HIGH
Total Protein ELP: 5.8 g/dL — ABNORMAL LOW (ref 6.0–8.5)

## 2022-07-12 ENCOUNTER — Inpatient Hospital Stay: Payer: Medicare Other

## 2022-07-12 ENCOUNTER — Inpatient Hospital Stay: Payer: Medicare Other | Admitting: Hospice and Palliative Medicine

## 2022-07-12 ENCOUNTER — Telehealth: Payer: Self-pay | Admitting: Oncology

## 2022-07-12 ENCOUNTER — Other Ambulatory Visit: Payer: Self-pay | Admitting: Oncology

## 2022-07-12 NOTE — Telephone Encounter (Signed)
pt daughter called to notify us that pt would not be here for appts. She has diarreah .

## 2022-07-12 NOTE — Telephone Encounter (Signed)
Tx today was cancelled. Pt will keep appt on 4/4.

## 2022-07-19 ENCOUNTER — Encounter: Payer: Self-pay | Admitting: Oncology

## 2022-07-19 ENCOUNTER — Inpatient Hospital Stay: Payer: Medicare Other | Attending: Oncology

## 2022-07-19 ENCOUNTER — Ambulatory Visit
Admission: RE | Admit: 2022-07-19 | Discharge: 2022-07-19 | Disposition: A | Discharge status: Home / Self Care | Payer: Medicare Other | Source: Ambulatory Visit | Attending: Oncology | Admitting: Oncology

## 2022-07-19 ENCOUNTER — Inpatient Hospital Stay (HOSPITAL_BASED_OUTPATIENT_CLINIC_OR_DEPARTMENT_OTHER): Payer: Medicare Other | Admitting: Oncology

## 2022-07-19 ENCOUNTER — Other Ambulatory Visit: Admission: RE | Admit: 2022-07-19 | Payer: Medicare Other | Source: Ambulatory Visit | Admitting: Oncology

## 2022-07-19 ENCOUNTER — Inpatient Hospital Stay: Payer: Medicare Other

## 2022-07-19 VITALS — BP 145/69 | HR 85 | Temp 97.4°F | Resp 18 | Wt 149.4 lb

## 2022-07-19 DIAGNOSIS — J40 Bronchitis, not specified as acute or chronic: Secondary | ICD-10-CM | POA: Diagnosis not present

## 2022-07-19 DIAGNOSIS — Z808 Family history of malignant neoplasm of other organs or systems: Secondary | ICD-10-CM | POA: Insufficient documentation

## 2022-07-19 DIAGNOSIS — K766 Portal hypertension: Secondary | ICD-10-CM | POA: Diagnosis not present

## 2022-07-19 DIAGNOSIS — M549 Dorsalgia, unspecified: Secondary | ICD-10-CM | POA: Insufficient documentation

## 2022-07-19 DIAGNOSIS — D631 Anemia in chronic kidney disease: Secondary | ICD-10-CM

## 2022-07-19 DIAGNOSIS — R062 Wheezing: Secondary | ICD-10-CM | POA: Insufficient documentation

## 2022-07-19 DIAGNOSIS — K219 Gastro-esophageal reflux disease without esophagitis: Secondary | ICD-10-CM | POA: Diagnosis not present

## 2022-07-19 DIAGNOSIS — Z7961 Long term (current) use of immunomodulator: Secondary | ICD-10-CM | POA: Insufficient documentation

## 2022-07-19 DIAGNOSIS — E876 Hypokalemia: Secondary | ICD-10-CM | POA: Insufficient documentation

## 2022-07-19 DIAGNOSIS — K529 Noninfective gastroenteritis and colitis, unspecified: Secondary | ICD-10-CM | POA: Insufficient documentation

## 2022-07-19 DIAGNOSIS — R051 Acute cough: Secondary | ICD-10-CM

## 2022-07-19 DIAGNOSIS — E8581 Light chain (AL) amyloidosis: Secondary | ICD-10-CM | POA: Insufficient documentation

## 2022-07-19 DIAGNOSIS — Z79899 Other long term (current) drug therapy: Secondary | ICD-10-CM | POA: Insufficient documentation

## 2022-07-19 DIAGNOSIS — E859 Amyloidosis, unspecified: Secondary | ICD-10-CM

## 2022-07-19 DIAGNOSIS — I129 Hypertensive chronic kidney disease with stage 1 through stage 4 chronic kidney disease, or unspecified chronic kidney disease: Secondary | ICD-10-CM | POA: Diagnosis not present

## 2022-07-19 DIAGNOSIS — K77 Liver disorders in diseases classified elsewhere: Secondary | ICD-10-CM | POA: Insufficient documentation

## 2022-07-19 DIAGNOSIS — R140 Abdominal distension (gaseous): Secondary | ICD-10-CM | POA: Insufficient documentation

## 2022-07-19 DIAGNOSIS — M50321 Other cervical disc degeneration at C4-C5 level: Secondary | ICD-10-CM | POA: Diagnosis not present

## 2022-07-19 DIAGNOSIS — Z886 Allergy status to analgesic agent status: Secondary | ICD-10-CM | POA: Insufficient documentation

## 2022-07-19 DIAGNOSIS — R200 Anesthesia of skin: Secondary | ICD-10-CM | POA: Diagnosis not present

## 2022-07-19 DIAGNOSIS — Z818 Family history of other mental and behavioral disorders: Secondary | ICD-10-CM | POA: Insufficient documentation

## 2022-07-19 DIAGNOSIS — M7989 Other specified soft tissue disorders: Secondary | ICD-10-CM

## 2022-07-19 DIAGNOSIS — R188 Other ascites: Secondary | ICD-10-CM | POA: Insufficient documentation

## 2022-07-19 DIAGNOSIS — N1831 Chronic kidney disease, stage 3a: Secondary | ICD-10-CM

## 2022-07-19 DIAGNOSIS — Z809 Family history of malignant neoplasm, unspecified: Secondary | ICD-10-CM | POA: Insufficient documentation

## 2022-07-19 DIAGNOSIS — W19XXXA Unspecified fall, initial encounter: Secondary | ICD-10-CM | POA: Diagnosis not present

## 2022-07-19 DIAGNOSIS — Z800 Family history of malignant neoplasm of digestive organs: Secondary | ICD-10-CM | POA: Insufficient documentation

## 2022-07-19 DIAGNOSIS — S0990XA Unspecified injury of head, initial encounter: Secondary | ICD-10-CM | POA: Diagnosis not present

## 2022-07-19 DIAGNOSIS — E854 Organ-limited amyloidosis: Secondary | ICD-10-CM

## 2022-07-19 DIAGNOSIS — K8020 Calculus of gallbladder without cholecystitis without obstruction: Secondary | ICD-10-CM | POA: Diagnosis not present

## 2022-07-19 DIAGNOSIS — R5383 Other fatigue: Secondary | ICD-10-CM | POA: Diagnosis not present

## 2022-07-19 DIAGNOSIS — Z888 Allergy status to other drugs, medicaments and biological substances status: Secondary | ICD-10-CM | POA: Insufficient documentation

## 2022-07-19 DIAGNOSIS — Z833 Family history of diabetes mellitus: Secondary | ICD-10-CM | POA: Insufficient documentation

## 2022-07-19 DIAGNOSIS — C9000 Multiple myeloma not having achieved remission: Secondary | ICD-10-CM

## 2022-07-19 LAB — CBC WITH DIFFERENTIAL/PLATELET
Abs Immature Granulocytes: 0.04 10*3/uL (ref 0.00–0.07)
Basophils Absolute: 0 10*3/uL (ref 0.0–0.1)
Basophils Relative: 0 %
Eosinophils Absolute: 0.1 10*3/uL (ref 0.0–0.5)
Eosinophils Relative: 1 %
HCT: 28.6 % — ABNORMAL LOW (ref 36.0–46.0)
Hemoglobin: 9.9 g/dL — ABNORMAL LOW (ref 12.0–15.0)
Immature Granulocytes: 1 %
Lymphocytes Relative: 18 %
Lymphs Abs: 1.5 10*3/uL (ref 0.7–4.0)
MCH: 27.1 pg (ref 26.0–34.0)
MCHC: 34.6 g/dL (ref 30.0–36.0)
MCV: 78.4 fL — ABNORMAL LOW (ref 80.0–100.0)
Monocytes Absolute: 0.5 10*3/uL (ref 0.1–1.0)
Monocytes Relative: 6 %
Neutro Abs: 6.1 10*3/uL (ref 1.7–7.7)
Neutrophils Relative %: 74 %
Platelets: 413 10*3/uL — ABNORMAL HIGH (ref 150–400)
RBC: 3.65 MIL/uL — ABNORMAL LOW (ref 3.87–5.11)
RDW: 22.1 % — ABNORMAL HIGH (ref 11.5–15.5)
WBC: 8.2 10*3/uL (ref 4.0–10.5)
nRBC: 0 % (ref 0.0–0.2)

## 2022-07-19 LAB — COMPREHENSIVE METABOLIC PANEL
ALT: 20 U/L (ref 0–44)
AST: 33 U/L (ref 15–41)
Albumin: 2.9 g/dL — ABNORMAL LOW (ref 3.5–5.0)
Alkaline Phosphatase: 356 U/L — ABNORMAL HIGH (ref 38–126)
Anion gap: 8 (ref 5–15)
BUN: 37 mg/dL — ABNORMAL HIGH (ref 8–23)
CO2: 26 mmol/L (ref 22–32)
Calcium: 8.6 mg/dL — ABNORMAL LOW (ref 8.9–10.3)
Chloride: 102 mmol/L (ref 98–111)
Creatinine, Ser: 1.69 mg/dL — ABNORMAL HIGH (ref 0.44–1.00)
GFR, Estimated: 31 mL/min — ABNORMAL LOW (ref >60)
Glucose, Bld: 189 mg/dL — ABNORMAL HIGH (ref 70–99)
Potassium: 3.5 mmol/L (ref 3.5–5.1)
Sodium: 136 mmol/L (ref 135–145)
Total Bilirubin: 2 mg/dL — ABNORMAL HIGH (ref 0.3–1.2)
Total Protein: 6.3 g/dL — ABNORMAL LOW (ref 6.5–8.1)

## 2022-07-19 LAB — PROTIME-INR
INR: 1.3 — ABNORMAL HIGH (ref 0.8–1.2)
Prothrombin Time: 15.8 seconds — ABNORMAL HIGH (ref 11.4–15.2)

## 2022-07-19 LAB — APTT: aPTT: 35 seconds (ref 24–36)

## 2022-07-19 LAB — LACTATE DEHYDROGENASE: LDH: 127 U/L (ref 98–192)

## 2022-07-19 NOTE — Progress Notes (Signed)
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 Daratumumab treatments hopefully would slow down the progression of antibiosis but probably not going to reverse pre-existing damage from amyloidosis.  Her overall prognosis is poor.  We discussed the option of stopping treatments if she prefers to focus on quality of life. Patient and daughter appreciate explanation and patient desires further treatments. Labs are reviewed and discussed with patient. Hold Daratumumab treatment due to wheezing/cough. Check CXR   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HCC) Hemoglobin is stable. Monitor  Hypokalemia Is likely secondary to chronic tosemide use.  Patient is also spironolactone.  Potassium is stable at 3.5. Continue monitor.  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  Patient adamantly declines. Bilirubin is stable.  Continue Torsemide, Spirolactone, follow up with Hepatology Dr. Dorris Fetch.   Ascites Patient declines paracentesis  Swelling of lower extremity This is likely due to generalized anasarca, portal hypertension, hypoalbuminemia  continue diuretics Negative DVT on ultrasound.    Orders Placed This Encounter  Procedures   DG Chest 2 View    Standing Status:   Future    Number of Occurrences:   1    Standing Expiration Date:   07/19/2023    Order Specific Question:   Reason for Exam (SYMPTOM  OR DIAGNOSIS REQUIRED)    Answer:   wheezing, coughing    Order Specific Question:   Preferred imaging location?    Answer:   [facility]    DG Chest 2 View    Standing Status:   Future    Standing Expiration Date:   07/19/2023    Order Specific Question:   Reason for Exam (SYMPTOM  OR DIAGNOSIS REQUIRED)    Answer:   Coughing, wheezing    Order Specific Question:   Preferred imaging location?    Answer:   [facility]   Comprehensive metabolic panel    Standing Status:   Future    Standing Expiration Date:   07/26/2023   CBC with Differential    Standing Status:   Future    Standing Expiration Date:   07/26/2023    Follow-up 1 week lab Dara  All questions were answered. The patient knows to call the clinic with any problems, questions or concerns.  [name], MD, PhD [facility] Hematology Oncology 07/19/2022   HISTORY OF PRESENTING ILLNESS:   Nancy Rodriguez is a  77 y.o.  female presents for follow up of amyloidosis.   Reviewed her previous medical records via care everywhere.  She was seen by Hematology Oncology at [facility] on 02/14/2017.  07/25/2007  SPEP showed M protein of 0.35, IFE showed IgG lamda 09/22/2007  Bone survey negative.  02/15/14 IgG 930, SPEP M protien 0.22, free kappa light chain 3.59, lamda 2.92, ratio 1.23  Hypercalcemia, resolved after stopping HCTZ.  # Transaminitis 03/28/20 US abdomen showed left liver lobe Complex 1.7 cm cyst  # 04/07/20 MRI abdomen w/wo contrast showed 2 benign liver cysts, 2 nonspecific hypovascular irighr liver lesions, abnormal bone marrow signal of L1 vertebral body. Patient was  advised to proceed with bone marrow biopsy and she declined.  # referred her to established care with GI and was  seen on 04/19/20,  # Liver biopsy showed amyloidosis. [place] MS/MS showed AL type # 05/20/20 recommend bone marrow biopsy which is scheduled on 05/24/2020.  Patient changed her mind and ask a biopsy to be canceled.  My team and I had multiple phone discussion with patient's daughter [name] and later with [name].  Patient agreed with bone marrow biopsy and a biopsy was scheduled on  06/09/2020.  05/20/20 NT proBNP  was normal,  TSH slightly increased, normal T4 Normal factor X Normal coags Troponin 19. Refer to cardiology for evaluation. May need cardiac MRI  # 06/01/2020 2D echo result was reviewed and discussed with patient.  Patient has normal LVEF 60-65%. Mild asymmetric left ventricular hypertrophy.  Left ventricular diastolic parameters are indeterminate. average left ventricular global longitudinal strain is -16.8 %.  # 06/09/2020, bone marrow biopsy showed monoclonal plasmacytosis, 8%, amyloid deposit present.  Absent iron stores. 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 t (14;20) #06/17/2020, further discussed about diagnosis and treatment plan.  Patient declined bone marrow transplant evaluation.  Decision was made to proceed with Dara-CyborD chemotherapy treatments. #May 2022, second opinion at [facility] with Dr. Tracey Harries.who agrees with current treatment plan.  He recommends to lower dexamethasone to 20 mg and decrease premed Tylenol to 325 mg  09/01/2020 cardiac MRI was performed at [facility].  Left ventricle is normal in size.  Mild to moderate basal septal hypertrophy.  Hyper dynamic LV function.  LVEF 71%.  Right ventricle is normal in size and wall thickness.  Systolic function normal.  Mild bi atrial enlargement, no significant aortic valve stenosis or regurgitation.  Trivial mitral regurgitation, mild tricuspid regurgitation and a trivial pulmonic regurgitation.  No evidence of MI, scarring or infiltration.  #History of major depression listed in outside problem list.  previous diagnosis and treatment details are not available to me in EMR  I have referred her to psychiatrist  #  vaginal bleeding which stopped- she was seen by GYN and recommended for biopsy.   # She has poor IV access and was not able to receive IV Emend and IV Aloxi. She does not want to have med port.   # GERD   Dr. Ola Spurr has increased her omeprazole  to 40 mg daily. She feels symptoms are improved.  # 10/08/2020, cervical spine showed cervical spondylosis.  Spinal canal diagnosis and a posterior disc osteophyte complex and superimposed small central disc protrusion contact the ventral spinal cord at C4-C5.  Multilevel neuroforaminal narrowing. bilateral atlantooccipital joint effusions, multilevel grade 1 spondylolisthesis. MRI lumbar spine without contrast showed acute or subacute fracture of L4.   Moderate spinal stenosis at L3-4 has progressed. Shallow left foraminal disc protrusion has progressed. Moderate subarticular stenosis on the left also with progression  Moderate spinal stenosis L4-5 with progression. Moderate subarticular stenosis on the left with progression  # 10/25/2020 seen by [facility] [facility] who recommend patient to switch Dara CyBorD to Dara RD  # 11/03/2020 Revlimid (lenalidomide) 10 mg by mouth daily for 14 days, then hold for 7 days. Pt was started on a shortened cycle in order to remain on similar schedule with infusion.   # 11/22/2020 seen by hepatologist Dr.Kappus [place]. Recommended to start Zoloft 50mg  daily for pruritis # 11/24/2020 cycle 2 Revlimid 10 mg, she finished 14 days.  Rest of the course was held due to anemia.  Patient declined blood transfusion.  # Vaginal bleeding  patient was seen by Dr.  Leafy Ro,  on megace 40mg  daily. 12/22/2020 repeat EMBx - not diagnostic due to insufficient squamous component.   03/03/2021, ultrasound abdomen complete showed possible hypoechoic mass in the pancreatic tail.  1.8 x 1.2 x 1.4 cm.  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  Cholelithiasis.  MRI has been ordered for further evaluation and is scheduled.  + Falls and presented to [facility].  Patient was found to have marked hyperglycemia, AKI, UTI. 05/03/2021, CT head showed chronic appearing subdural hematoma, measuring up to 1.6 on the left and 0.9 on the right. 05/18/2021, patient was seen by  cardiology Dr.End, patient was recommended to continue torsemide 20 mg daily as needed for edema.  Patient was recommended to discontinue aspirin 81 mg due to frequent falls and chronic subdural hematoma.  Patient has not had further falls.  She has home physical therapy  + Chronic diarrhea, GI Dr.Kappus recommended endoscopy evaluation and she declined.  INTERVAL HISTORY LEXANY PELLMAN is a 77 y.o. female who has above history reviewed by me today presents for follow up visit for management of AL amyloidosis Patient was accompanied by her daughter today.   Patient continues to have bilateral lower extremity swelling/edema.  Chronic abdomen distention. Patient expresses concern about a "rattle", cough and potential wheezing, questioning if it could be related to allergy season. .    Review of Systems  Constitutional:  Positive for fatigue. Negative for appetite change, chills, fever and unexpected weight change.  HENT:   Negative for hearing loss, nosebleeds and voice change.   Eyes:  Negative for eye problems and icterus.  Respiratory:  Negative for chest tightness and cough.   Cardiovascular:  Positive for leg swelling. Negative for chest pain.  Gastrointestinal:  Positive for abdominal distention and diarrhea. Negative for abdominal pain, blood in stool and nausea.  Endocrine: Negative for hot flashes.  Genitourinary:  Negative for difficulty urinating and frequency.   Musculoskeletal:  Positive for back pain. Negative for arthralgias.  Skin:  Negative for itching and rash.  Neurological:  Positive for numbness. Negative for extremity weakness.  Hematological:  Negative for adenopathy.  Psychiatric/Behavioral:  Negative for confusion.     MEDICAL HISTORY:  Past Medical History:  Diagnosis Date   (HFpEF) heart failure with preserved ejection fraction    a. 05/2020 Echo: EF 60-65%; b. 08/2020 cMRI ([facility]): EF 71%, no delayed hyperenhancement to suggest scar/infiltrative dzs. Nl RV  fxn. Mild BAE. Triv MR, mild TR; c. 06/2021 Echo: EF 50-55%, no rwma, mild basal-septal LVH, GrII DD. Nl RV size/fxn. Mild MR. Ao sclerosis.   AL amyloidosis    Anemia due to chronic kidney disease    Anemia in chronic kidney disease   CKD (chronic kidney disease), stage III    Diabetes mellitus without complication    History of cardiovascular stress test    a. 03/2012 Stress Echo([facility]): nl study.   Hypercholesterolemia    Hypertension    MGUS (monoclonal gammopathy of unknown significance)    Osteoarthritis    Rheumatoid arthritis     SURGICAL HISTORY: Past Surgical History:  Procedure Laterality Date   HERNIA REPAIR     INTRAMEDULLARY (IM) NAIL INTERTROCHANTERIC Right 09/26/2021   Procedure: INTRAMEDULLARY (IM) NAIL INTERTROCHANTRIC;  Surgeon: Thornton Park, MD;  Location: [facility] ORS;  Service: Orthopedics;  Laterality: Right;   PARATHYROIDECTOMY      SOCIAL HISTORY: Social History   Socioeconomic History   Marital status: Widowed    Spouse name: Not on file  Number of children: 6   Years of education: Not on file   Highest education level: Not on file  Occupational History   Not on file  Tobacco Use   Smoking status: Never   Smokeless tobacco: Never  Vaping Use   Vaping Use: Never used  Substance and Sexual Activity   Alcohol use: No   Drug use: Never   Sexual activity: Not on file  Other Topics Concern   Not on file  Social History Narrative   Not on file   Social Determinants of Health   Financial Resource Strain: Not on file  Food Insecurity: Not on file  Transportation Needs: Not on file  Physical Activity: Not on file  Stress: Not on file  Social Connections: Not on file  Intimate Partner Violence: Not on file    FAMILY HISTORY: Family History  Problem Relation Age of Onset   Diabetes Daughter    Diabetes Son    Dementia Mother    Cancer Father    Esophageal cancer Sister    Brain cancer Brother     ALLERGIES:  is allergic to benazepril,  lisinopril, tolmetin, and nsaids.  MEDICATIONS:  Current Outpatient Medications  Medication Sig Dispense Refill   acyclovir (ZOVIRAX) 400 MG tablet Take 1 tablet (400 mg total) by mouth 2 (two) times daily. 60 tablet 2   albuterol (VENTOLIN HFA) 108 (90 Base) MCG/ACT inhaler Inhale 2 puffs into the lungs every 4 (four) hours as needed for wheezing or shortness of breath.     amLODipine (NORVASC) 5 MG tablet Take 5 mg by mouth daily.     gabapentin (NEURONTIN) 100 MG capsule Take 100 mg by mouth at bedtime.     loperamide (IMODIUM) 2 MG capsule Take 1 capsule (2 mg total) by mouth See admin instructions. Initial: 4 mg, followed by 2 mg after each loose stool, maximum 16 mg within 24 hours. 60 capsule 0   metoprolol succinate (TOPROL-XL) 50 MG 24 hr tablet Take 50 mg by mouth daily.     mupirocin ointment (BACTROBAN) 2 % Apply to affected area 3 times daily 22 g 0   omeprazole (PRILOSEC) 20 MG capsule Take 20 mg by mouth 2 (two) times daily.     polyethylene glycol (MIRALAX / GLYCOLAX) 17 g packet Take 17 g by mouth daily as needed for mild constipation. 14 each 0   spironolactone (ALDACTONE) 100 MG tablet Take 200 mg by mouth daily.     torsemide (DEMADEX) 20 MG tablet Take 20 mg by mouth daily as needed.     traMADol (ULTRAM) 50 MG tablet Take 1 tablet (50 mg total) by mouth every 12 (twelve) hours as needed for moderate pain. 10 tablet 0   EPINEPHrine 0.3 mg/0.3 mL IJ SOAJ injection Inject 0.3 mg into the muscle as needed. (Patient not taking: Reported on 06/21/2022)     ferrous sulfate 325 (65 FE) MG tablet Take 325 mg by mouth 2 (two) times daily with a meal. (Patient not taking: Reported on 07/05/2022)     sertraline (ZOLOFT) 25 MG tablet Take 50 mg by mouth daily. (Patient not taking: Reported on 06/21/2022)     No current facility-administered medications for this visit.     PHYSICAL EXAMINATION: ECOG PERFORMANCE STATUS: 2 - Symptomatic, <50% confined to bed Vitals:   07/19/22 1422  BP:  (!) 145/69  Pulse: 85  Resp: 18  Temp: (!) 97.4 F (36.3 C)  SpO2: 98%   Filed Weights   07/19/22  1422  Weight: 149 lb 6.4 oz (67.8 kg)    Physical Exam Constitutional:      General: She is not in acute distress.    Appearance: She is ill-appearing.  HENT:     Head: Normocephalic and atraumatic.  Eyes:     General: No scleral icterus. Cardiovascular:     Rate and Rhythm: Normal rate and regular rhythm.     Heart sounds: Murmur heard.  Pulmonary:     Effort: Pulmonary effort is normal. No respiratory distress.     Breath sounds: No wheezing.  Abdominal:     General: Bowel sounds are normal. There is distension.     Palpations: Abdomen is soft.  Musculoskeletal:        General: No deformity. Normal range of motion.     Cervical back: Normal range of motion and neck supple.     Right lower leg: Edema present.     Left lower leg: Edema present.  Skin:    General: Skin is warm and dry.     Findings: No erythema or rash.     Comments: Several subcentimeter subcutaneous firm nodules on abdomen wall  Neurological:     Mental Status: She is alert. Mental status is at baseline.     Cranial Nerves: No cranial nerve deficit.     Coordination: Coordination normal.  Psychiatric:        Mood and Affect: Mood normal.     LABORATORY DATA:  I have reviewed the data as listed    Latest Ref Rng & Units 07/19/2022    1:53 PM 07/05/2022    1:35 PM 06/28/2022    7:33 PM  CBC  WBC 4.0 - 10.5 K/uL 8.2  6.8  6.7   Hemoglobin 12.0 - 15.0 g/dL 9.9  10.3  11.2   Hematocrit 36.0 - 46.0 % 28.6  29.3  32.6   Platelets 150 - 400 K/uL 413  438  440       Latest Ref Rng & Units 07/19/2022    1:53 PM 07/05/2022    1:35 PM 06/21/2022    9:47 AM  CMP  Glucose 70 - 99 mg/dL 189  184  244   BUN 8 - 23 mg/dL 37  29  33   Creatinine 0.44 - 1.00 mg/dL 1.69  1.34  1.42   Sodium 135 - 145 mmol/L 136  138  140   Potassium 3.5 - 5.1 mmol/L 3.5  3.3  3.3   Chloride 98 - 111 mmol/L 102  105  107   CO2  22 - 32 mmol/L 26  25  23    Calcium 8.9 - 10.3 mg/dL 8.6  8.7  8.5   Total Protein 6.5 - 8.1 g/dL 6.3  6.6  6.4   Total Bilirubin 0.3 - 1.2 mg/dL 2.0  3.1  2.2   Alkaline Phos 38 - 126 U/L 356  368  346   AST 15 - 41 U/L 33  39  30   ALT 0 - 44 U/L 20  26  23       Iron/TIBC/Ferritin/ %Sat    Component Value Date/Time   IRON 46 06/21/2022 0947   TIBC 211 (L) 06/21/2022 0947   FERRITIN 429 (H) 06/21/2022 0947   IRONPCTSAT 22 06/21/2022 0947     08/25/2019, platelet count 491, WBC 7.5, hemoglobin 12 Creatinine 1.58, EGFR 37, calcium 10.8, albumin 4.2 Negative hepatitis B surface antigen, hepatitis B core antibody, Negative hepatitis C 08/05/2019, A1c 11.2  RADIOGRAPHIC STUDIES: I have personally reviewed the radiological images as listed and agreed with the findings in the report. CT Cervical Spine Wo Contrast  Result Date: 06/28/2022 CLINICAL DATA:  Neck trauma (Age >= 65y) EXAM: CT CERVICAL SPINE WITHOUT CONTRAST TECHNIQUE: Multidetector CT imaging of the cervical spine was performed without intravenous contrast. Multiplanar CT image reconstructions were also generated. RADIATION DOSE REDUCTION: This exam was performed according to the departmental dose-optimization program which includes automated exposure control, adjustment of the mA and/or kV according to patient size and/or use of iterative reconstruction technique. COMPARISON:  Cervical spine CT 09/26/2021 FINDINGS: Alignment: Straightening of the cervical lordosis likely due to patient positioning. Unchanged trace degenerative anterolisthesis at C2-C3 and C3-C4. Skull base and vertebrae: There is no evidence of acute cervical spine fracture. Soft tissues and spinal canal: No prevertebral fluid or swelling. No visible canal hematoma. Disc levels: There is multilevel degenerative disc disease and facet arthropathy, with disc bulging and uncovertebral joint hypertrophy most prominent from C4-C7. Varying degrees of mild spinal canal  and neural foraminal narrowing, worst at C4-C5 and C5-C6, similar to prior exam. There is progressive right-sided facet arthritis at C3-C4 with probable joint effusion some articular surface irregularity (series 6, image 20). Upper chest: Negative. Other: None. IMPRESSION: No evidence of acute cervical spine fracture. Multilevel degenerative disc disease and facet arthropathy. 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 Recommend with inflammatory markers, as infectious facet arthritis could have a similar appearance. Electronically Signed   By: Maurine Simmering M.D.   On: 06/28/2022 18:31   CT HEAD WO CONTRAST (5MM)  Result Date: 06/28/2022 CLINICAL DATA:  Head trauma, minor (Age >= 65y).  Fall. EXAM: CT HEAD WITHOUT CONTRAST TECHNIQUE: Contiguous axial images were obtained from the base of the skull through the vertex without intravenous contrast. RADIATION DOSE REDUCTION: This exam was performed according to the departmental dose-optimization program which includes automated exposure control, adjustment of the mA and/or kV according to patient size and/or use of iterative reconstruction technique. 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 No definite acute intracranial hemorrhage, acute infarct, mass, or midline shift is identified. There is mild cerebral atrophy. Vascular: Calcified atherosclerosis at the skull base. No hyperdense vessel. Skull: No acute fracture or suspicious osseous lesion. Sinuses/Orbits: Visualized paranasal sinuses and mastoid air cells are clear. Right cataract extraction. Other: None. IMPRESSION: 1. No evidence of acute intracranial abnormality. 2. Dural thickening and/or small  chronic subdural hematomas over both cerebral convexities, unchanged on the left and smaller on the right since 09/26/2021 without mass effect. Electronically Signed   By: Logan Bores M.D.   On: 06/28/2022 18:26   US Venous Img Lower Bilateral  Result Date: 06/28/2022 CLINICAL DATA:  Bilateral lower extremity edema. EXAM: BILATERAL LOWER EXTREMITY VENOUS DOPPLER ULTRASOUND TECHNIQUE: Gray-scale sonography with compression, as well as color and duplex ultrasound, were performed to evaluate the deep venous system(s) from the level of the common femoral vein through the popliteal and proximal calf veins. COMPARISON:  None Available. FINDINGS: VENOUS Normal compressibility of the common femoral, superficial femoral, and popliteal veins, as well as the visualized calf veins. Visualized portions of profunda femoral vein and great saphenous vein unremarkable. No filling defects to suggest DVT on grayscale or  color Doppler imaging. Doppler waveforms show normal direction of venous flow, normal respiratory plasticity and response to augmentation. OTHER Marked subcutaneous edema distal thighs and calves. Limitations: none IMPRESSION: 1. No evidence of bilateral lower extremity DVT. 2. Marked subcutaneous edema distal thighs and calves. Electronically Signed   By: Keane Police D.O.   On: 06/28/2022 16:51

## 2022-07-19 NOTE — Assessment & Plan Note (Signed)
Is likely secondary to chronic tosemide use.  Patient is also spironolactone.  Potassium is stable at 3.5. Continue monitor.

## 2022-07-19 NOTE — Assessment & Plan Note (Signed)
This is likely due to generalized anasarca, portal hypertension, hypoalbuminemia  continue diuretics Negative DVT on ultrasound. 

## 2022-07-19 NOTE — Assessment & Plan Note (Addendum)
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 Daratumumab treatments hopefully would slow down the progression of antibiosis but probably not going to reverse pre-existing damage from amyloidosis.  Her overall prognosis is poor.  We discussed the option of stopping treatments if she prefers to focus on quality of life. Patient and daughter appreciate explanation and patient desires further treatments. Labs are reviewed and discussed with patient. Hold Daratumumab treatment due to wheezing/cough. Check CXR

## 2022-07-19 NOTE — Assessment & Plan Note (Signed)
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  Patient adamantly declines. Bilirubin is stable.  Continue Torsemide, Spirolactone, follow up with Hepatology Dr. Dorris Fetch.

## 2022-07-19 NOTE — Assessment & Plan Note (Signed)
Hemoglobin is stable. Monitor.  

## 2022-07-19 NOTE — Assessment & Plan Note (Signed)
Patient declines paracentesis 

## 2022-07-20 LAB — KAPPA/LAMBDA LIGHT CHAINS
Kappa free light chain: 14.6 mg/L (ref 3.3–19.4)
Kappa, lambda light chain ratio: 1.52 (ref 0.26–1.65)
Lambda free light chains: 9.6 mg/L (ref 5.7–26.3)

## 2022-07-20 LAB — BETA 2 MICROGLOBULIN, SERUM: Beta-2 Microglobulin: 5.3 mg/L — ABNORMAL HIGH (ref 0.6–2.4)

## 2022-07-23 LAB — MULTIPLE MYELOMA PANEL, SERUM
Albumin SerPl Elph-Mcnc: 2.9 g/dL (ref 2.9–4.4)
Albumin/Glob SerPl: 1.2 (ref 0.7–1.7)
Alpha 1: 0.4 g/dL (ref 0.0–0.4)
Alpha2 Glob SerPl Elph-Mcnc: 0.7 g/dL (ref 0.4–1.0)
B-Globulin SerPl Elph-Mcnc: 0.8 g/dL (ref 0.7–1.3)
Gamma Glob SerPl Elph-Mcnc: 0.7 g/dL (ref 0.4–1.8)
Globulin, Total: 2.5 g/dL (ref 2.2–3.9)
IgA: 94 mg/dL (ref 64–422)
IgG (Immunoglobin G), Serum: 706 mg/dL (ref 586–1602)
IgM (Immunoglobulin M), Srm: 33 mg/dL (ref 26–217)
M Protein SerPl Elph-Mcnc: 0.2 g/dL — ABNORMAL HIGH
Total Protein ELP: 5.4 g/dL — ABNORMAL LOW (ref 6.0–8.5)

## 2022-07-24 ENCOUNTER — Telehealth: Payer: Self-pay

## 2022-07-24 NOTE — Telephone Encounter (Signed)
-----   Message from Rickard Patience, MD sent at 07/23/2022  8:01 PM EDT ----- Regarding: RE: Calling for Results Contact: [phone] Xray result is good, no acute process ----- Message ----- From: Coralee Rud, RN Sent: 07/23/2022   9:23 AM EDT To: Rickard Patience, MD Subject: RE: Calling for Results                        Looks like she had chest xray yesterday.   ----- Message ----- From: Reggy Eye Sent: 07/23/2022   9:17 AM EDT To: Coralee Rud, RN; Paulita Fujita, CMA Subject: Calling for Results                            Patient called and got our answering service today at 8:14 am. She said she is calling for results.

## 2022-07-24 NOTE — Telephone Encounter (Signed)
Called and spoke to [place] and [place] and informed them of results.

## 2022-07-26 ENCOUNTER — Encounter: Payer: Self-pay | Admitting: Oncology

## 2022-07-26 ENCOUNTER — Inpatient Hospital Stay: Payer: Medicare Other

## 2022-07-26 ENCOUNTER — Other Ambulatory Visit: Payer: Medicare Other

## 2022-07-26 ENCOUNTER — Inpatient Hospital Stay (HOSPITAL_BASED_OUTPATIENT_CLINIC_OR_DEPARTMENT_OTHER): Payer: Medicare Other | Admitting: Oncology

## 2022-07-26 ENCOUNTER — Ambulatory Visit: Payer: Medicare Other

## 2022-07-26 VITALS — BP 138/74 | HR 81 | Temp 97.8°F | Wt 147.6 lb

## 2022-07-26 DIAGNOSIS — E876 Hypokalemia: Secondary | ICD-10-CM | POA: Diagnosis not present

## 2022-07-26 DIAGNOSIS — E8581 Light chain (AL) amyloidosis: Secondary | ICD-10-CM | POA: Diagnosis not present

## 2022-07-26 DIAGNOSIS — E854 Organ-limited amyloidosis: Secondary | ICD-10-CM

## 2022-07-26 DIAGNOSIS — D631 Anemia in chronic kidney disease: Secondary | ICD-10-CM

## 2022-07-26 DIAGNOSIS — N1831 Chronic kidney disease, stage 3a: Secondary | ICD-10-CM | POA: Diagnosis not present

## 2022-07-26 DIAGNOSIS — E859 Amyloidosis, unspecified: Secondary | ICD-10-CM

## 2022-07-26 DIAGNOSIS — K77 Liver disorders in diseases classified elsewhere: Secondary | ICD-10-CM

## 2022-07-26 LAB — COMPREHENSIVE METABOLIC PANEL WITH GFR
ALT: 15 U/L (ref 0–44)
AST: 30 U/L (ref 15–41)
Albumin: 3.1 g/dL — ABNORMAL LOW (ref 3.5–5.0)
Alkaline Phosphatase: 345 U/L — ABNORMAL HIGH (ref 38–126)
Anion gap: 11 (ref 5–15)
BUN: 28 mg/dL — ABNORMAL HIGH (ref 8–23)
CO2: 26 mmol/L (ref 22–32)
Calcium: 8.7 mg/dL — ABNORMAL LOW (ref 8.9–10.3)
Chloride: 101 mmol/L (ref 98–111)
Creatinine, Ser: 1.23 mg/dL — ABNORMAL HIGH (ref 0.44–1.00)
GFR, Estimated: 45 mL/min — ABNORMAL LOW
Glucose, Bld: 172 mg/dL — ABNORMAL HIGH (ref 70–99)
Potassium: 3.3 mmol/L — ABNORMAL LOW (ref 3.5–5.1)
Sodium: 138 mmol/L (ref 135–145)
Total Bilirubin: 1.9 mg/dL — ABNORMAL HIGH (ref 0.3–1.2)
Total Protein: 6.3 g/dL — ABNORMAL LOW (ref 6.5–8.1)

## 2022-07-26 LAB — CBC WITH DIFFERENTIAL/PLATELET
Abs Immature Granulocytes: 0.05 10*3/uL (ref 0.00–0.07)
Basophils Absolute: 0 10*3/uL (ref 0.0–0.1)
Basophils Relative: 1 %
Eosinophils Absolute: 0.1 10*3/uL (ref 0.0–0.5)
Eosinophils Relative: 2 %
HCT: 29.3 % — ABNORMAL LOW (ref 36.0–46.0)
Hemoglobin: 9.9 g/dL — ABNORMAL LOW (ref 12.0–15.0)
Immature Granulocytes: 1 %
Lymphocytes Relative: 24 %
Lymphs Abs: 1.6 10*3/uL (ref 0.7–4.0)
MCH: 27.2 pg (ref 26.0–34.0)
MCHC: 33.8 g/dL (ref 30.0–36.0)
MCV: 80.5 fL (ref 80.0–100.0)
Monocytes Absolute: 0.5 10*3/uL (ref 0.1–1.0)
Monocytes Relative: 7 %
Neutro Abs: 4.5 10*3/uL (ref 1.7–7.7)
Neutrophils Relative %: 65 %
Platelets: 390 10*3/uL (ref 150–400)
RBC: 3.64 MIL/uL — ABNORMAL LOW (ref 3.87–5.11)
RDW: 21.3 % — ABNORMAL HIGH (ref 11.5–15.5)
WBC: 6.8 10*3/uL (ref 4.0–10.5)
nRBC: 0 % (ref 0.0–0.2)

## 2022-07-26 NOTE — Assessment & Plan Note (Addendum)
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 Hold Daratumumab treatment due to bronchitis.  Light chain ratio has normalized. Recommend treatment break, patient daughter agreed with the plan.

## 2022-07-26 NOTE — Assessment & Plan Note (Signed)
Hemoglobin is stable. Monitor.  

## 2022-07-26 NOTE — Progress Notes (Signed)
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 Hold Daratumumab treatment due to bronchitis.   Recommend treatment break, patient daughter agreed with the plan.   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HCC) Hemoglobin is stable. Monitor  Hypokalemia Is likely secondary to chronic tosemide use.  Patient is also spironolactone.  Potassium is stable  Continue monitor.  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  Patient adamantly declines. Bilirubin is stable.  Continue Torsemide, Spirolactone, follow up with Hepatology Dr. Guido Sander.     Orders Placed This Encounter  Procedures   Beta 2 microglobulin, serum    Standing Status:   Future    Standing Expiration Date:   08/16/2023   Multiple Myeloma Panel (SPEP&IFE w/QIG)    Standing Status:   Future    Standing Expiration Date:   08/16/2023   Kappa/lambda light chains    Standing Status:   Future    Standing Expiration Date:   08/16/2023   Comprehensive metabolic panel    Standing Status:   Future    Standing Expiration Date:   08/16/2023   CBC with Differential    Standing Status:   Future    Standing Expiration Date:   08/16/2023    Follow-up 3 weeks.   All questions were answered. The patient knows to call the clinic with any problems, questions or concerns.  Rickard Patience, MD, PhD [facility] Hematology Oncology 07/26/2022   HISTORY OF PRESENTING ILLNESS:   Nancy Rodriguez is a  77 y.o.  female presents for follow up of amyloidosis.   Reviewed her previous medical records via care everywhere.  She was seen by Hematology Oncology at [facility] on  02/14/2017.  07/25/2007  SPEP showed M protein of 0.35, IFE showed IgG lamda 09/22/2007  Bone survey negative.  02/15/14 IgG 930, SPEP M protien 0.22, free kappa light chain 3.59, lamda 2.92, ratio 1.23  Hypercalcemia, resolved after stopping HCTZ.  # Transaminitis 03/28/20 US abdomen showed left liver lobe Complex 1.7 cm cyst  # 04/07/20 MRI abdomen w/wo contrast showed 2 benign liver cysts, 2 nonspecific hypovascular irighr liver lesions, abnormal bone marrow signal of L1 vertebral body. Patient was  advised to proceed with bone marrow biopsy and she declined.  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  Patient changed her mind and ask a biopsy to be canceled.  My team and I had multiple phone discussion with patient's daughter [name] and later with the patient's son Fayrene Fearing.  Patient agreed with bone marrow biopsy and a biopsy was scheduled on 06/09/2020.  05/20/20 NT proBNP  was normal,  TSH slightly increased, normal T4 Normal factor X Normal coags Troponin 19. Refer to cardiology for evaluation. May need cardiac MRI  # 06/01/2020 2D echo result was reviewed and discussed with patient.  Patient has normal LVEF 60-65%. Mild asymmetric left ventricular hypertrophy.  Left ventricular diastolic parameters are indeterminate. average left ventricular global longitudinal strain is -16.8 %.  # 06/09/2020, bone marrow biopsy showed monoclonal plasmacytosis, 8%, amyloid deposit present.  Absent iron stores. Myeloma FISH panel showed IgH  rearrangement (not to CCND1or MAF or FGFR3) or Trisomy 14. t (14;20) #06/17/2020, further discussed about diagnosis and treatment plan.  Patient declined bone marrow transplant evaluation.  Decision was made to proceed with Dara-CyborD chemotherapy treatments. #May 2022, second opinion at [facility] with Dr. Pandora Leiter.who agrees with  current treatment plan.  He recommends to lower dexamethasone to 20 mg and decrease premed Tylenol to 325 mg  09/01/2020 cardiac MRI was performed at [facility].  Left ventricle is normal in size.  Mild to moderate basal septal hypertrophy.  Hyper dynamic LV function.  LVEF 71%.  Right ventricle is normal in size and wall thickness.  Systolic function normal.  Mild bi atrial enlargement, no significant aortic valve stenosis or regurgitation.  Trivial mitral regurgitation, mild tricuspid regurgitation and a trivial pulmonic regurgitation.  No evidence of MI, scarring or infiltration.  #History of major depression listed in outside problem list.  previous diagnosis and treatment details are not available to me in EMR  I have referred her to psychiatrist  #  vaginal bleeding which stopped- she was seen by GYN and recommended for biopsy.   # She has poor IV access and was not able to receive IV Emend and IV Aloxi. She does not want to have med port.   # GERD   Dr. Sampson Goon has increased her omeprazole to 40 mg daily. She feels symptoms are improved.  # 10/08/2020, cervical spine showed cervical spondylosis.  Spinal canal diagnosis and a posterior disc osteophyte complex and superimposed small central disc protrusion contact the ventral spinal cord at C4-C5.  Multilevel neuroforaminal narrowing. bilateral atlantooccipital joint effusions, multilevel grade 1 spondylolisthesis. MRI lumbar spine without contrast showed acute or subacute fracture of L4.   Moderate spinal stenosis at L3-4 has progressed. Shallow left foraminal disc protrusion has progressed. Moderate subarticular stenosis on the left also with progression  Moderate spinal stenosis L4-5 with progression. Moderate subarticular stenosis on the left with progression  # 10/25/2020 seen by [facility] oncology Dr.Kang who recommend patient to switch Dara CyBorD to Dara RD  # 11/03/2020 Revlimid (lenalidomide) 10 mg by mouth daily for 14 days, then hold for 7  days. Pt was started on a shortened cycle in order to remain on similar schedule with infusion.   # 11/22/2020 seen by hepatologist Dr.Kappus Molli Hazard. Recommended to start Zoloft 50mg  daily for pruritis # 11/24/2020 cycle 2 Revlimid 10 mg, she finished 14 days.  Rest of the course was held due to anemia.  Patient declined blood transfusion.  # Vaginal bleeding  patient was seen by Dr. Dalbert Garnet,  on megace 40mg  daily. 12/22/2020 repeat EMBx - not diagnostic due to insufficient squamous component.   03/03/2021, ultrasound abdomen complete showed possible hypoechoic mass in the pancreatic tail.  1.8 x 1.2 x 1.4 cm.  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  Cholelithiasis.  MRI has been ordered for further evaluation and is scheduled.  + Falls and presented to [facility].  Patient was found to have marked hyperglycemia, AKI, UTI. 05/03/2021, CT head showed chronic appearing subdural hematoma, measuring up to 1.6 on the left and 0.9 on the right. 05/18/2021, patient was seen by cardiology Dr.End, patient was recommended to continue torsemide 20 mg daily as needed for edema.  Patient was recommended to discontinue aspirin 81 mg due to frequent falls and chronic subdural hematoma.  Patient has not had further falls.  She has home physical therapy  + Chronic diarrhea, GI Dr.Kappus recommended endoscopy evaluation and she declined.  INTERVAL HISTORY Nancy Rodriguez is a 77 y.o. female who has above history reviewed by me today presents for follow up visit for management of AL amyloidosis Patient was accompanied by her daughter today.   Patient continues to have bilateral lower extremity swelling/edema.  Chronic abdomen distention. Cough and chest congestion, has been recently started on doxycycline and guaifenesin codeine. She has no new complaints. .    Review of Systems  Constitutional:  Positive for fatigue. Negative for appetite change, chills, fever and unexpected weight  change.  HENT:   Negative for hearing loss, nosebleeds and voice change.   Eyes:  Negative for eye problems and icterus.  Respiratory:  Negative for chest tightness and cough.   Cardiovascular:  Positive for leg swelling. Negative for chest pain.  Gastrointestinal:  Positive for abdominal distention and diarrhea. Negative for abdominal pain, blood in stool and nausea.  Endocrine: Negative for hot flashes.  Genitourinary:  Negative for difficulty urinating and frequency.   Musculoskeletal:  Positive for back pain. Negative for arthralgias.  Skin:  Negative for itching and rash.  Neurological:  Positive for numbness. Negative for extremity weakness.  Hematological:  Negative for adenopathy.  Psychiatric/Behavioral:  Negative for confusion.     MEDICAL HISTORY:  Past Medical History:  Diagnosis Date   (HFpEF) heart failure with preserved ejection fraction    a. 05/2020 Echo: EF 60-65%; b. 08/2020 cMRI ([facility]): EF 71%, no delayed hyperenhancement to suggest scar/infiltrative dzs. Nl RV fxn. Mild BAE. Triv MR, mild TR; c. 06/2021 Echo: EF 50-55%, no rwma, mild basal-septal LVH, GrII DD. Nl RV size/fxn. Mild MR. Ao sclerosis.   AL amyloidosis    Anemia due to chronic kidney disease    Anemia in chronic kidney disease   CKD (chronic kidney disease), stage III    Diabetes mellitus without complication    History of cardiovascular stress test    a. 03/2012 Stress Echo([facility]): nl study.   Hypercholesterolemia    Hypertension    MGUS (monoclonal gammopathy of unknown significance)    Osteoarthritis    Rheumatoid arthritis     SURGICAL HISTORY: Past Surgical History:  Procedure Laterality Date   HERNIA REPAIR     INTRAMEDULLARY (IM) NAIL INTERTROCHANTERIC Right 09/26/2021   Procedure: INTRAMEDULLARY (IM) NAIL INTERTROCHANTRIC;  Surgeon: Juanell FairlyKrasinski, Kevin, MD;  Location: [facility] ORS;  Service: Orthopedics;  Laterality: Right;   PARATHYROIDECTOMY      SOCIAL HISTORY: Social History    Socioeconomic History   Marital status: Widowed    Spouse name: Not on file   Number of children: 6   Years of education: Not on file   Highest education level: Not on file  Occupational History   Not on file  Tobacco Use   Smoking status: Never   Smokeless tobacco: Never  Vaping Use   Vaping Use: Never used  Substance and Sexual Activity   Alcohol use: No   Drug use: Never   Sexual activity: Not on file  Other Topics Concern   Not on file  Social History Narrative   Not on file   Social Determinants of Health   Financial Resource Strain: Not on file  Food Insecurity: Not on file  Transportation Needs: Not on file  Physical Activity: Not on file  Stress: Not on file  Social Connections: Not on file  Intimate Partner Violence: Not on file    FAMILY HISTORY: Family History  Problem Relation Age of Onset   Diabetes Daughter    Diabetes Son  Dementia Mother    Cancer Father    Esophageal cancer Sister    Brain cancer Brother     ALLERGIES:  is allergic to benazepril, lisinopril, tolmetin, and nsaids.  MEDICATIONS:  Current Outpatient Medications  Medication Sig Dispense Refill   acyclovir (ZOVIRAX) 400 MG tablet Take 1 tablet (400 mg total) by mouth 2 (two) times daily. 60 tablet 2   albuterol (VENTOLIN HFA) 108 (90 Base) MCG/ACT inhaler Inhale 2 puffs into the lungs every 4 (four) hours as needed for wheezing or shortness of breath.     amLODipine (NORVASC) 5 MG tablet Take 5 mg by mouth daily.     doxycycline (VIBRAMYCIN) 100 MG capsule Take 1 capsule by mouth 2 (two) times daily.     gabapentin (NEURONTIN) 100 MG capsule Take 100 mg by mouth at bedtime.     guaiFENesin-codeine 100-10 MG/5ML syrup Take by mouth.     loperamide (IMODIUM) 2 MG capsule Take 1 capsule (2 mg total) by mouth See admin instructions. Initial: 4 mg, followed by 2 mg after each loose stool, maximum 16 mg within 24 hours. 60 capsule 0   metoprolol succinate (TOPROL-XL) 50 MG 24 hr  tablet Take 50 mg by mouth daily.     mupirocin ointment (BACTROBAN) 2 % Apply to affected area 3 times daily 22 g 0   polyethylene glycol (MIRALAX / GLYCOLAX) 17 g packet Take 17 g by mouth daily as needed for mild constipation. 14 each 0   spironolactone (ALDACTONE) 100 MG tablet Take 200 mg by mouth daily.     torsemide (DEMADEX) 20 MG tablet Take 20 mg by mouth daily as needed.     traMADol (ULTRAM) 50 MG tablet Take 1 tablet (50 mg total) by mouth every 12 (twelve) hours as needed for moderate pain. 10 tablet 0   EPINEPHrine 0.3 mg/0.3 mL IJ SOAJ injection Inject 0.3 mg into the muscle as needed. (Patient not taking: Reported on 06/21/2022)     ferrous sulfate 325 (65 FE) MG tablet Take 325 mg by mouth 2 (two) times daily with a meal. (Patient not taking: Reported on 07/05/2022)     omeprazole (PRILOSEC) 20 MG capsule Take 20 mg by mouth 2 (two) times daily. (Patient not taking: Reported on 07/26/2022)     sertraline (ZOLOFT) 25 MG tablet Take 50 mg by mouth daily. (Patient not taking: Reported on 06/21/2022)     No current facility-administered medications for this visit.     PHYSICAL EXAMINATION: ECOG PERFORMANCE STATUS: 2 - Symptomatic, <50% confined to bed Vitals:   07/26/22 1450  BP: 138/74  Pulse: 81  Temp: 97.8 F (36.6 C)  SpO2: 100%   Filed Weights   07/26/22 1450  Weight: 147 lb 9.6 oz (67 kg)    Physical Exam Constitutional:      General: She is not in acute distress.    Appearance: She is ill-appearing.  HENT:     Head: Normocephalic and atraumatic.  Eyes:     General: No scleral icterus. Cardiovascular:     Rate and Rhythm: Normal rate and regular rhythm.     Heart sounds: Murmur heard.  Pulmonary:     Effort: Pulmonary effort is normal. No respiratory distress.     Breath sounds: No wheezing.  Abdominal:     General: Bowel sounds are normal. There is distension.     Palpations: Abdomen is soft.  Musculoskeletal:        General: No deformity. Normal range  of motion.  Cervical back: Normal range of motion and neck supple.     Right lower leg: Edema present.     Left lower leg: Edema present.  Skin:    General: Skin is warm and dry.     Findings: No erythema or rash.     Comments: Several subcentimeter subcutaneous firm nodules on abdomen wall  Neurological:     Mental Status: She is alert. Mental status is at baseline.     Cranial Nerves: No cranial nerve deficit.     Coordination: Coordination normal.  Psychiatric:        Mood and Affect: Mood normal.     LABORATORY DATA:  I have reviewed the data as listed    Latest Ref Rng & Units 07/26/2022    2:27 PM 07/19/2022    1:53 PM 07/05/2022    1:35 PM  CBC  WBC 4.0 - 10.5 K/uL 6.8  8.2  6.8   Hemoglobin 12.0 - 15.0 g/dL 9.9  9.9  81.1   Hematocrit 36.0 - 46.0 % 29.3  28.6  29.3   Platelets 150 - 400 K/uL 390  413  438       Latest Ref Rng & Units 07/26/2022    2:27 PM 07/19/2022    1:53 PM 07/05/2022    1:35 PM  CMP  Glucose 70 - 99 mg/dL 914  782  956   BUN 8 - 23 mg/dL 28  37  29   Creatinine 0.44 - 1.00 mg/dL 2.13  0.86  5.78   Sodium 135 - 145 mmol/L 138  136  138   Potassium 3.5 - 5.1 mmol/L 3.3  3.5  3.3   Chloride 98 - 111 mmol/L 101  102  105   CO2 22 - 32 mmol/L 26  26  25    Calcium 8.9 - 10.3 mg/dL 8.7  8.6  8.7   Total Protein 6.5 - 8.1 g/dL 6.3  6.3  6.6   Total Bilirubin 0.3 - 1.2 mg/dL 1.9  2.0  3.1   Alkaline Phos 38 - 126 U/L 345  356  368   AST 15 - 41 U/L 30  33  39   ALT 0 - 44 U/L 15  20  26       Iron/TIBC/Ferritin/ %Sat    Component Value Date/Time   IRON 46 06/21/2022 0947   TIBC 211 (L) 06/21/2022 0947   FERRITIN 429 (H) 06/21/2022 0947   IRONPCTSAT 22 06/21/2022 0947     08/25/2019, platelet count 491, WBC 7.5, hemoglobin 12 Creatinine 1.58, EGFR 37, calcium 10.8, albumin 4.2 Negative hepatitis B surface antigen, hepatitis B core antibody, Negative hepatitis C 08/05/2019, A1c 11.2   RADIOGRAPHIC STUDIES: I have personally reviewed the  radiological images as listed and agreed with the findings in the report. DG Chest 2 View  Result Date: 07/22/2022 CLINICAL DATA:  wheezing, coughing EXAM: CHEST - 2 VIEW COMPARISON:  09/26/2021. FINDINGS: The heart size and mediastinal contours are within normal limits. Both lungs are clear. Right hemidiaphragm elevated similar to the prior study. No pneumothorax or pleural effusion. There are thoracic degenerative changes. IMPRESSION: No active cardiopulmonary disease. Electronically Signed   By: Layla Maw M.D.   On: 07/22/2022 17:37   CT Cervical Spine Wo Contrast  Result Date: 06/28/2022 CLINICAL DATA:  Neck trauma (Age >= 65y) EXAM: CT CERVICAL SPINE WITHOUT CONTRAST TECHNIQUE: Multidetector CT imaging of the cervical spine was performed without intravenous contrast. Multiplanar CT image reconstructions were also generated. RADIATION  DOSE REDUCTION: This exam was performed according to the departmental dose-optimization program which includes automated exposure control, adjustment of the mA and/or kV according to patient size and/or use of iterative reconstruction technique. COMPARISON:  Cervical spine CT 09/26/2021 FINDINGS: Alignment: Straightening of the cervical lordosis likely due to patient positioning. Unchanged trace degenerative anterolisthesis at C2-C3 and C3-C4. Skull base and vertebrae: There is no evidence of acute cervical spine fracture. Soft tissues and spinal canal: No prevertebral fluid or swelling. No visible canal hematoma. Disc levels: There is multilevel degenerative disc disease and facet arthropathy, with disc bulging and uncovertebral joint hypertrophy most prominent from C4-C7. Varying degrees of mild spinal canal and neural foraminal narrowing, worst at C4-C5 and C5-C6, similar to prior exam. There is progressive right-sided facet arthritis at C3-C4 with probable joint effusion some articular surface irregularity (series 6, image 20). Upper chest: Negative. Other: None.  IMPRESSION: No evidence of acute cervical spine fracture. Multilevel degenerative disc disease and facet arthropathy. 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 Recommend with inflammatory markers, as infectious facet arthritis could have a similar appearance. Electronically Signed   By: Caprice Renshaw M.D.   On: 06/28/2022 18:31   CT HEAD WO CONTRAST ( )  Result Date: 06/28/2022 CLINICAL DATA:  Head trauma, minor (Age >= 65y).  Fall. EXAM: CT HEAD WITHOUT CONTRAST TECHNIQUE: Contiguous axial images were obtained from the base of the skull through the vertex without intravenous contrast. RADIATION DOSE REDUCTION: This exam was performed according to the departmental dose-optimization program which includes automated exposure control, adjustment of the mA and/or kV according to patient size and/or use of iterative reconstruction technique. 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 No definite acute intracranial hemorrhage, acute infarct, mass, or midline shift is identified. There is mild cerebral atrophy. Vascular: Calcified atherosclerosis at the skull base. No hyperdense vessel. Skull: No acute fracture or suspicious osseous lesion. Sinuses/Orbits: Visualized paranasal sinuses and mastoid air cells are clear. Right cataract extraction. Other: None. IMPRESSION: 1. No evidence of acute intracranial abnormality. 2. 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 Electronically Signed   By: Sebastian Ache M.D.   On: 06/28/2022 18:26   US Venous Img Lower Bilateral  Result Date:  06/28/2022 CLINICAL DATA:  Bilateral lower extremity edema. EXAM: BILATERAL LOWER EXTREMITY VENOUS DOPPLER ULTRASOUND TECHNIQUE: Gray-scale sonography with compression, as well as color and duplex ultrasound, were performed to evaluate the deep venous system(s) from the level of the common femoral vein through the popliteal and proximal calf veins. COMPARISON:  None Available. FINDINGS: VENOUS Normal compressibility of the common femoral, superficial femoral, and popliteal veins, as well as the visualized calf veins. Visualized portions of profunda femoral vein and great saphenous vein unremarkable. No filling defects to suggest DVT on grayscale or color Doppler imaging. Doppler waveforms show normal direction of venous flow, normal respiratory plasticity and response to augmentation. OTHER Marked subcutaneous edema distal thighs and calves. Limitations: none IMPRESSION: 1. No evidence of bilateral lower extremity DVT. 2. Marked subcutaneous edema distal thighs and calves. Electronically Signed   By: Larose Hires D.O.   On: 06/28/2022 16:51

## 2022-07-26 NOTE — Assessment & Plan Note (Signed)
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  Patient adamantly declines. Bilirubin is stable.  Continue Torsemide, Spirolactone, follow up with Hepatology Dr. Kappus.  

## 2022-07-26 NOTE — Assessment & Plan Note (Signed)
Is likely secondary to chronic tosemide use.  Patient is also spironolactone.  Potassium is stable  Continue monitor.

## 2022-07-30 ENCOUNTER — Other Ambulatory Visit: Payer: Medicare Other

## 2022-07-30 DIAGNOSIS — Z515 Encounter for palliative care: Secondary | ICD-10-CM

## 2022-07-30 NOTE — Progress Notes (Signed)
1240 Palliative Care  Follow Up Encounter Note   NAME: Nancy Rodriguez DOB: [birth year] MRN: [id number]  PRIMARY CARE PROVIDER: Mick Sell, MD  RESPONSIBLE PARTY:  Acct ID - Guarantor Home Phone Work Phone Relationship Acct Type  [id number] Nancy Rodriguez, SEGNER* [phone]  Self P/F     [address] [place], [place] [address]    I connected with pt on 07/30/22 by telephone and verified that I am speaking with the correct person using two identifiers.   I discussed the limitations of evaluation and management by telemedicine. The patient expressed understanding and agreed to proceed.    HISTORY OF PRESENT ILLNESS:   77 y.o. year old female  with multiple medical problems including monoclonal gammopathy, CKD stage 3b, anemia, RA, diabetes, cirrhosis, and AL amyloidosis.   Since last encounter: Pt reports having a cold right now. Reports that "Dr. gave me antibiotics and codeine syrup for it and I am some better but still coughing up thick yellow stuff."  Cancer: Pt reports that chemo treatments are on hold until May. States, "oncologist said i needed a break." Labs look better, billiburibin has decreased from 11 to 1. Dr is amazed."   Cognitive: Alert and oriented, participating in conversation. Answers questions appropriately.   Appetite: Pt reports that appetite is usually ok. Weight is down to 115. Last encounter reports weight as 126.   Mobility: Uses walker and wheel chair. Reports last fall was a "couple of weeks ago at the hospital when I was leaving treatment. Pt reports that she was walking with walker instead of riding in wheelchair and got tripped up." No injuries, was taken to ER.    Pain: Denies any pain.  Gi/GU: Pt reports having diarrhea often, unsure of cause, but reports having to wear depends all the time.   Palliative Care/ Hospice: RN reminded pt of role of palliative care and pt stated she was open to future calls/ visits  Next appt scheduled: RN to call  again in one week.           Barbette Merino, RN

## 2022-08-02 ENCOUNTER — Other Ambulatory Visit: Payer: Medicare Other

## 2022-08-02 ENCOUNTER — Ambulatory Visit: Payer: Medicare Other

## 2022-08-16 ENCOUNTER — Inpatient Hospital Stay: Payer: Medicare Other

## 2022-08-16 ENCOUNTER — Inpatient Hospital Stay: Payer: Medicare Other | Admitting: Oncology

## 2022-08-16 ENCOUNTER — Other Ambulatory Visit: Payer: Self-pay | Admitting: Oncology

## 2022-08-16 ENCOUNTER — Telehealth: Payer: Self-pay | Admitting: Oncology

## 2022-08-16 DIAGNOSIS — E859 Amyloidosis, unspecified: Secondary | ICD-10-CM

## 2022-08-16 NOTE — Telephone Encounter (Signed)
Called patient to see if she was coming today. She said she has a cold and she forgot her appointment. Pleas advise on reschedule 5/9 will be ok.

## 2022-08-16 NOTE — Telephone Encounter (Signed)
Dr. Cathie Hoops has reviewed Dr. Bosie Clos note and they both agree on additional chemo break. Pt will come back in 2 months. Will inform her of appts once scheduled.

## 2022-08-16 NOTE — Assessment & Plan Note (Deleted)
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  Patient adamantly declines. Bilirubin is stable.  Continue Torsemide, Spirolactone, follow up with Hepatology Dr. Kappus.  

## 2022-08-16 NOTE — Assessment & Plan Note (Deleted)
Patient declines paracentesis 

## 2022-08-16 NOTE — Telephone Encounter (Signed)
Called patient to see if she was coming today. She said she has a cold and she forgot her appointment. Pleas advise on reschedule

## 2022-08-16 NOTE — Assessment & Plan Note (Deleted)
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 Hold Daratumumab treatment due to bronchitis.  Light chain ratio has normalized. Recommend treatment break, patient daughter agreed with the plan.  

## 2022-08-16 NOTE — Telephone Encounter (Signed)
MD informed. She will defer in IS to a few weeks.

## 2022-08-16 NOTE — Assessment & Plan Note (Deleted)
Hemoglobin is stable. Monitor.  

## 2022-08-17 NOTE — Telephone Encounter (Signed)
Called and spoke to pt and gave her new appt details. Will mail appt reminder as well.

## 2022-08-24 ENCOUNTER — Telehealth: Payer: Self-pay

## 2022-08-24 NOTE — Telephone Encounter (Signed)
1053 Palliative Care Note  RN called and spoke with pt and daughter, using two pt identifiers. Pt stated she was doing very well and daughter agreed. No problems at present, but did ask for in person home visit. RN to let home team know of request. Pt open to frequent check in calls between visits.    Barbette Merino, RN

## 2022-09-19 ENCOUNTER — Telehealth: Payer: Self-pay

## 2022-09-19 NOTE — Telephone Encounter (Signed)
1418 Palliative Care Note  Volunteer attempted palliative check in call yesterday, 09/18/22. No answer, LVM.   Barbette Merino, RN

## 2022-10-15 ENCOUNTER — Telehealth: Payer: Self-pay | Admitting: Oncology

## 2022-10-15 NOTE — Telephone Encounter (Signed)
Pt called to r/s appts for tomorrow.   Can you update the treatment plan so I can r/s the appts to July 11th?   Thank you

## 2022-10-16 ENCOUNTER — Other Ambulatory Visit: Payer: Self-pay | Admitting: Oncology

## 2022-10-16 ENCOUNTER — Inpatient Hospital Stay: Payer: Medicare Other

## 2022-10-16 ENCOUNTER — Inpatient Hospital Stay: Payer: Medicare Other | Admitting: Oncology

## 2022-10-16 DIAGNOSIS — E859 Amyloidosis, unspecified: Secondary | ICD-10-CM

## 2022-10-25 ENCOUNTER — Other Ambulatory Visit: Payer: Self-pay

## 2022-10-25 ENCOUNTER — Inpatient Hospital Stay: Payer: Medicare Other

## 2022-10-25 ENCOUNTER — Inpatient Hospital Stay: Payer: Medicare Other | Attending: Oncology

## 2022-10-25 ENCOUNTER — Inpatient Hospital Stay (HOSPITAL_BASED_OUTPATIENT_CLINIC_OR_DEPARTMENT_OTHER): Payer: Medicare Other | Admitting: Oncology

## 2022-10-25 ENCOUNTER — Encounter: Payer: Self-pay | Admitting: Oncology

## 2022-10-25 VITALS — BP 158/92 | HR 101 | Temp 98.3°F | Resp 18 | Wt 146.9 lb

## 2022-10-25 DIAGNOSIS — Z79899 Other long term (current) drug therapy: Secondary | ICD-10-CM | POA: Insufficient documentation

## 2022-10-25 DIAGNOSIS — N1831 Chronic kidney disease, stage 3a: Secondary | ICD-10-CM | POA: Diagnosis not present

## 2022-10-25 DIAGNOSIS — D631 Anemia in chronic kidney disease: Secondary | ICD-10-CM

## 2022-10-25 DIAGNOSIS — I130 Hypertensive heart and chronic kidney disease with heart failure and stage 1 through stage 4 chronic kidney disease, or unspecified chronic kidney disease: Secondary | ICD-10-CM | POA: Diagnosis not present

## 2022-10-25 DIAGNOSIS — E859 Amyloidosis, unspecified: Secondary | ICD-10-CM

## 2022-10-25 DIAGNOSIS — R188 Other ascites: Secondary | ICD-10-CM | POA: Insufficient documentation

## 2022-10-25 DIAGNOSIS — E8581 Light chain (AL) amyloidosis: Secondary | ICD-10-CM | POA: Insufficient documentation

## 2022-10-25 DIAGNOSIS — M7989 Other specified soft tissue disorders: Secondary | ICD-10-CM

## 2022-10-25 DIAGNOSIS — Z79624 Long term (current) use of inhibitors of nucleotide synthesis: Secondary | ICD-10-CM | POA: Insufficient documentation

## 2022-10-25 DIAGNOSIS — E876 Hypokalemia: Secondary | ICD-10-CM

## 2022-10-25 DIAGNOSIS — E854 Organ-limited amyloidosis: Secondary | ICD-10-CM

## 2022-10-25 DIAGNOSIS — N939 Abnormal uterine and vaginal bleeding, unspecified: Secondary | ICD-10-CM | POA: Insufficient documentation

## 2022-10-25 DIAGNOSIS — K77 Liver disorders in diseases classified elsewhere: Secondary | ICD-10-CM

## 2022-10-25 DIAGNOSIS — Z7961 Long term (current) use of immunomodulator: Secondary | ICD-10-CM | POA: Insufficient documentation

## 2022-10-25 LAB — CBC WITH DIFFERENTIAL/PLATELET
Abs Immature Granulocytes: 0.03 10*3/uL (ref 0.00–0.07)
Basophils Absolute: 0.1 10*3/uL (ref 0.0–0.1)
Basophils Relative: 1 %
Eosinophils Absolute: 0.2 10*3/uL (ref 0.0–0.5)
Eosinophils Relative: 3 %
HCT: 28.5 % — ABNORMAL LOW (ref 36.0–46.0)
Hemoglobin: 9.7 g/dL — ABNORMAL LOW (ref 12.0–15.0)
Immature Granulocytes: 0 %
Lymphocytes Relative: 19 %
Lymphs Abs: 1.4 10*3/uL (ref 0.7–4.0)
MCH: 25.9 pg — ABNORMAL LOW (ref 26.0–34.0)
MCHC: 34 g/dL (ref 30.0–36.0)
MCV: 76.2 fL — ABNORMAL LOW (ref 80.0–100.0)
Monocytes Absolute: 0.5 10*3/uL (ref 0.1–1.0)
Monocytes Relative: 7 %
Neutro Abs: 5 10*3/uL (ref 1.7–7.7)
Neutrophils Relative %: 70 %
Platelets: 386 10*3/uL (ref 150–400)
RBC: 3.74 MIL/uL — ABNORMAL LOW (ref 3.87–5.11)
RDW: 22.3 % — ABNORMAL HIGH (ref 11.5–15.5)
WBC: 7.2 10*3/uL (ref 4.0–10.5)
nRBC: 0 % (ref 0.0–0.2)

## 2022-10-25 LAB — COMPREHENSIVE METABOLIC PANEL
ALT: 21 U/L (ref 0–44)
AST: 31 U/L (ref 15–41)
Albumin: 2.9 g/dL — ABNORMAL LOW (ref 3.5–5.0)
Alkaline Phosphatase: 341 U/L — ABNORMAL HIGH (ref 38–126)
Anion gap: 9 (ref 5–15)
BUN: 18 mg/dL (ref 8–23)
CO2: 21 mmol/L — ABNORMAL LOW (ref 22–32)
Calcium: 8.7 mg/dL — ABNORMAL LOW (ref 8.9–10.3)
Chloride: 108 mmol/L (ref 98–111)
Creatinine, Ser: 0.82 mg/dL (ref 0.44–1.00)
GFR, Estimated: >60 mL/min (ref >60)
Glucose, Bld: 180 mg/dL — ABNORMAL HIGH (ref 70–99)
Potassium: 3.5 mmol/L (ref 3.5–5.1)
Sodium: 138 mmol/L (ref 135–145)
Total Bilirubin: 1.6 mg/dL — ABNORMAL HIGH (ref 0.3–1.2)
Total Protein: 5.9 g/dL — ABNORMAL LOW (ref 6.5–8.1)

## 2022-10-25 LAB — IRON AND TIBC
Iron: 51 ug/dL (ref 28–170)
Saturation Ratios: 24 % (ref 10.4–31.8)
TIBC: 209 ug/dL — ABNORMAL LOW (ref 250–450)
UIBC: 158 ug/dL

## 2022-10-25 LAB — FERRITIN: Ferritin: 443 ng/mL — ABNORMAL HIGH (ref 11–307)

## 2022-10-25 NOTE — Progress Notes (Signed)
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  Labs are reviewed and discussed with patient. Stable/slightly improved bilirubin level. Amyloidosis labs are pending.  I recommend patient to consider resuming Daratumumab maintenance. She declined treatment today.  She agrees to return in 4 weeks and further discuss.     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HCC) Lab Results  Component Value Date   HGB 9.7 (L) 10/25/2022   TIBC 209 (L) 10/25/2022   IRONPCTSAT 24 10/25/2022   FERRITIN 443 (H) 10/25/2022    Hemoglobin is stable. Monitor.   Hypokalemia Is likely secondary to chronic tosemide use.  Patient is also spironolactone.  Potassium is stable  Continue monitor.  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  Patient declines. Bilirubin is stable.  Continue Torsemide, Spirolactone, follow up with Hepatology Dr. Guido Sander.   Ascites Patient declines paracentesis  Swelling of lower extremity This is likely due to generalized anasarca, portal hypertension, hypoalbuminemia  continue diuretics Negative DVT on ultrasound.    Orders Placed This Encounter  Procedures   Iron and TIBC    Standing Status:   Future    Number of Occurrences:   1    Standing Expiration Date:   10/25/2023   Ferritin    Standing Status:   Future    Number of Occurrences:   1    Standing Expiration Date:   10/25/2023   CBC with Differential (Cancer Center Only)    Standing Status:   Future    Standing Expiration Date:   10/25/2023   CMP (Cancer Center only)    Standing Status:   Future    Standing Expiration Date:   10/25/2023    Multiple Myeloma Panel (SPEP&IFE w/QIG)    Standing Status:   Future    Standing Expiration Date:   10/25/2023   Kappa/lambda light chains    Standing Status:   Future    Standing Expiration Date:   10/25/2023   Beta 2 microglobulin, serum    Standing Status:   Future    Standing Expiration Date:   10/25/2023    Follow-up 4 weeks.   All questions were answered. The patient knows to call the clinic with any problems, questions or concerns.  Rickard Patience, MD, PhD [facility] Hematology Oncology 10/25/2022   HISTORY OF PRESENTING ILLNESS:   Nancy Rodriguez is a  77 y.o.  female presents for follow up of amyloidosis.   Reviewed her previous medical records via care everywhere.  She was seen by Hematology Oncology at [facility] on 02/14/2017.  07/25/2007  SPEP showed M protein of 0.35, IFE showed IgG lamda 09/22/2007  Bone survey negative.  02/15/14 IgG 930, SPEP M protien 0.22, free kappa light chain 3.59, lamda 2.92, ratio 1.23  Hypercalcemia, resolved after stopping HCTZ.  # Transaminitis 03/28/20 US abdomen showed left liver lobe Complex 1.7 cm cyst  # 04/07/20 MRI abdomen w/wo contrast showed 2 benign liver cysts, 2 nonspecific hypovascular irighr liver lesions, abnormal bone marrow signal of L1 vertebral body. Patient was  advised to proceed with bone marrow biopsy and she declined.  # referred her to established care with GI and was seen on 04/19/20,  # Liver biopsy showed amyloidosis. LC MS/MS showed AL type #  05/20/20 recommend bone marrow biopsy which is scheduled on 05/24/2020.  Patient changed her mind and ask a biopsy to be canceled.  My team and I had multiple phone discussion with patient's daughter [name] and later with the patient's son Fayrene Fearing.  Patient agreed with bone marrow biopsy and a biopsy was scheduled on 06/09/2020.  05/20/20 NT proBNP  was normal,  TSH slightly increased, normal T4 Normal factor X Normal coags Troponin 19. Refer to cardiology for evaluation. May need cardiac  MRI  # 06/01/2020 2D echo result was reviewed and discussed with patient.  Patient has normal LVEF 60-65%. Mild asymmetric left ventricular hypertrophy.  Left ventricular diastolic parameters are indeterminate. average left ventricular global longitudinal strain is -16.8 %.  # 06/09/2020, bone marrow biopsy showed monoclonal plasmacytosis, 8%, amyloid deposit present.  Absent iron stores. 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 t (14;20) #06/17/2020, further discussed about diagnosis and treatment plan.  Patient declined bone marrow transplant evaluation.  Decision was made to proceed with Dara-CyborD chemotherapy treatments. #May 2022, second opinion at [facility] with Dr. Pandora Leiter.who agrees with current treatment plan.  He recommends to lower dexamethasone to 20 mg and decrease premed Tylenol to 325 mg  09/01/2020 cardiac [facility] was [facility].  Left ventricle is normal in size.  Mild to moderate basal septal hypertrophy.  Hyper dynamic LV function.  LVEF 71%.  Right ventricle is normal in size and wall thickness.  Systolic function normal.  Mild bi atrial enlargement, no significant aortic valve stenosis or regurgitation.  Trivial mitral regurgitation, mild tricuspid regurgitation and a trivial pulmonic regurgitation.  No evidence of MI, scarring or infiltration.  #History of major depression listed in outside problem list.  previous diagnosis and treatment details are not available to me in EMR  I have referred her to psychiatrist  #  vaginal bleeding which stopped- she was seen by GYN and recommended for biopsy.   # She has poor IV access and was not able to receive IV Emend and IV Aloxi. She does not want to have med port.   # GERD   Dr. Sampson Goon has increased her omeprazole to 40 mg daily. She feels symptoms are improved.  # 10/08/2020, cervical spine showed cervical spondylosis.  Spinal canal diagnosis and a posterior disc osteophyte complex and  superimposed small central disc protrusion contact the ventral spinal cord at C4-C5.  Multilevel neuroforaminal narrowing. bilateral atlantooccipital joint effusions, multilevel grade 1 spondylolisthesis. MRI lumbar spine without contrast showed acute or subacute fracture of L4.   Moderate spinal stenosis at L3-4 has progressed. Shallow left foraminal disc protrusion has progressed. Moderate subarticular stenosis on the left also with progression  Moderate spinal stenosis L4-5 with progression. Moderate subarticular stenosis on the left with progression  # 10/25/2020 seen by [facility] oncology Dr.Kang who recommend patient to switch Dara CyBorD to Dara RD  # 11/03/2020 Revlimid (lenalidomide) 10 mg by mouth daily for 14 days, then hold for 7 days. Pt was started on a shortened cycle in order to remain on similar schedule with infusion.   # 11/22/2020 seen by hepatologist Dr.Kappus Molli Hazard. Recommended to start Zoloft 50mg  daily for pruritis # 11/24/2020 cycle 2 Revlimid 10 mg, she finished 14 days.  Rest of the course was held due to anemia.  Patient declined blood transfusion.  # Vaginal bleeding  patient was seen by Dr. Dalbert Garnet,  on megace 40mg  daily. 12/22/2020 repeat EMBx - not diagnostic due to insufficient  squamous component.   03/03/2021, ultrasound abdomen complete showed possible hypoechoic mass in the pancreatic tail.  1.8 x 1.2 x 1.4 cm.  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  Cholelithiasis.  MRI has been ordered for further evaluation and is scheduled.  + Falls and presented to [facility].  Patient was found to have marked hyperglycemia, AKI, UTI. 05/03/2021, CT head showed chronic appearing subdural hematoma, measuring up to 1.6 on the left and 0.9 on the right. 05/18/2021, patient was seen by cardiology Dr.End, patient was recommended to continue torsemide 20 mg daily as needed for edema.  Patient was recommended to discontinue aspirin 81 mg due to frequent falls and  chronic subdural hematoma.  Patient has not had further falls.  She has home physical therapy  + Chronic diarrhea, GI Dr.Kappus recommended endoscopy evaluation and she declined.  INTERVAL HISTORY NIKAYA NASBY is a 77 y.o. female who has above history reviewed by me today presents for follow up visit for management of AL amyloidosis Patient was accompanied by her daughter today.   Patient continues to have bilateral lower extremity swelling/edema.  Chronic abdomen distention. She takes Diuretics.  She has occasional cough, denies SOB.  Weight is stable. .    Review of Systems  Constitutional:  Positive for fatigue. Negative for appetite change, chills, fever and unexpected weight change.  HENT:   Negative for hearing loss, nosebleeds and voice change.   Eyes:  Negative for eye problems and icterus.  Respiratory:  Negative for chest tightness and cough.   Cardiovascular:  Positive for leg swelling. Negative for chest pain.  Gastrointestinal:  Positive for abdominal distention and diarrhea. Negative for abdominal pain, blood in stool and nausea.  Endocrine: Negative for hot flashes.  Genitourinary:  Negative for difficulty urinating and frequency.   Musculoskeletal:  Positive for back pain. Negative for arthralgias.  Skin:  Negative for itching and rash.  Neurological:  Positive for numbness. Negative for extremity weakness.  Hematological:  Negative for adenopathy.  Psychiatric/Behavioral:  Negative for confusion.     MEDICAL HISTORY:  Past Medical History:  Diagnosis Date   (HFpEF) heart failure with preserved ejection fraction (HCC)    a. 05/2020 Echo: EF 60-65%; b. 08/2020 cMRI ([facility]): EF 71%, no delayed hyperenhancement to suggest scar/infiltrative dzs. Nl RV fxn. Mild BAE. Triv MR, mild TR; c. 06/2021 Echo: EF 50-55%, no rwma, mild basal-septal LVH, GrII DD. Nl RV size/fxn. Mild MR. Ao sclerosis.   AL amyloidosis (HCC)    Anemia due to chronic kidney disease    Anemia in  chronic kidney disease   CKD (chronic kidney disease), stage III (HCC)    Diabetes mellitus without complication (HCC)    History of cardiovascular stress test    a. 03/2012 Stress Echo([facility]): nl study.   Hypercholesterolemia    Hypertension    MGUS (monoclonal gammopathy of unknown significance)    Osteoarthritis    Rheumatoid arthritis (HCC)     SURGICAL HISTORY: Past Surgical History:  Procedure Laterality Date   HERNIA REPAIR     INTRAMEDULLARY (IM) NAIL INTERTROCHANTERIC Right 09/26/2021   Procedure: INTRAMEDULLARY (IM) NAIL INTERTROCHANTRIC;  Surgeon: Juanell Fairly, MD;  Location: [facility] ORS;  Service: Orthopedics;  Laterality: Right;   PARATHYROIDECTOMY      SOCIAL HISTORY: Social History   Socioeconomic History   Marital status: Widowed    Spouse name: Not on file   Number of children: 6   Years of education: Not on file  Highest education level: Not on file  Occupational History   Not on file  Tobacco Use   Smoking status: Never   Smokeless tobacco: Never  Vaping Use   Vaping status: Never Used  Substance and Sexual Activity   Alcohol use: No   Drug use: Never   Sexual activity: Not on file  Other Topics Concern   Not on file  Social History Narrative   Not on file   Social Determinants of Health   Financial Resource Strain: Not on file  Food Insecurity: Not on file  Transportation Needs: No Transportation Needs (05/05/2021)   [facility]   [facility] - Transportation    In the past 12 months, has lack of transportation kept you from medical appointments or from getting medications?: No    Lack of Transportation (Non-Medical): No  Physical Activity: Not on file  Stress: Not on file  Social Connections: Not on file  Intimate Partner Violence: Not on file    FAMILY HISTORY: Family History  Problem Relation Age of Onset   Diabetes Daughter    Diabetes Son    Dementia Mother    Cancer  Father    Esophageal cancer Sister    Brain cancer Brother     ALLERGIES:  is allergic to benazepril, lisinopril, tolmetin, and nsaids.  MEDICATIONS:  Current Outpatient Medications  Medication Sig Dispense Refill   acyclovir (ZOVIRAX) 400 MG tablet Take 1 tablet (400 mg total) by mouth 2 (two) times daily. 60 tablet 2   albuterol (VENTOLIN HFA) 108 (90 Base) MCG/ACT inhaler Inhale 2 puffs into the lungs every 4 (four) hours as needed for wheezing or shortness of breath.     amLODipine (NORVASC) 5 MG tablet Take 5 mg by mouth daily.     gabapentin (NEURONTIN) 100 MG capsule Take 100 mg by mouth at bedtime.     HUMALOG KWIKPEN 100 UNIT/ML KwikPen Inject into the skin 3 (three) times daily. Per sliding scale.     LANTUS SOLOSTAR 100 UNIT/ML Solostar Pen Inject 7 Units into the skin daily.     metoprolol succinate (TOPROL-XL) 50 MG 24 hr tablet Take 50 mg by mouth daily.     omeprazole (PRILOSEC) 20 MG capsule Take 20 mg by mouth 2 (two) times daily.     polyethylene glycol (MIRALAX / GLYCOLAX) 17 g packet Take 17 g by mouth daily as needed for mild constipation. 14 each 0   sertraline (ZOLOFT) 25 MG tablet Take 50 mg by mouth daily.     spironolactone (ALDACTONE) 100 MG tablet Take 200 mg by mouth daily.     torsemide (DEMADEX) 20 MG tablet Take 20 mg by mouth daily as needed.     traMADol (ULTRAM) 50 MG tablet Take 1 tablet (50 mg total) by mouth every 12 (twelve) hours as needed for moderate pain. 10 tablet 0   EPINEPHrine 0.3 mg/0.3 mL IJ SOAJ injection Inject 0.3 mg into the muscle as needed. (Patient not taking: Reported on 06/21/2022)     ferrous sulfate 325 (65 FE) MG tablet Take 325 mg by mouth 2 (two) times daily with a meal. (Patient not taking: Reported on 07/05/2022)     guaiFENesin-codeine 100-10 MG/5ML syrup Take by mouth. (Patient not taking: Reported on 10/25/2022)     loperamide (IMODIUM) 2 MG capsule Take 1 capsule (2 mg total) by mouth See admin instructions. Initial: 4 mg,  followed by 2 mg after each loose stool, maximum  16 mg within 24 hours. (Patient not taking: Reported on 10/25/2022) 60 capsule 0   No current facility-administered medications for this visit.     PHYSICAL EXAMINATION: ECOG PERFORMANCE STATUS: 2 - Symptomatic, <50% confined to bed Vitals:   10/25/22 1035  BP: (!) 158/92  Pulse: (!) 101  Resp: 18  Temp: 98.3 F (36.8 C)   Filed Weights   10/25/22 1035  Weight: 146 lb 14.4 oz (66.6 kg)    Physical Exam Constitutional:      General: She is not in acute distress.    Appearance: She is ill-appearing.  HENT:     Head: Normocephalic and atraumatic.  Eyes:     General: No scleral icterus. Cardiovascular:     Rate and Rhythm: Normal rate and regular rhythm.     Heart sounds: Murmur heard.  Pulmonary:     Effort: Pulmonary effort is normal. No respiratory distress.     Breath sounds: No wheezing.  Abdominal:     General: Bowel sounds are normal. There is distension.     Palpations: Abdomen is soft.  Musculoskeletal:        General: No deformity. Normal range of motion.     Cervical back: Normal range of motion and neck supple.     Right lower leg: Edema present.     Left lower leg: Edema present.  Skin:    General: Skin is warm and dry.     Findings: No erythema or rash.     Comments: Several subcentimeter subcutaneous firm nodules on abdomen wall  Neurological:     Mental Status: She is alert. Mental status is at baseline.     Cranial Nerves: No cranial nerve deficit.     Coordination: Coordination normal.  Psychiatric:        Mood and Affect: Mood normal.     LABORATORY DATA:  I have reviewed the data as listed    Latest Ref Rng & Units 10/25/2022   10:05 AM 07/26/2022    2:27 PM 07/19/2022    1:53 PM  CBC  WBC 4.0 - 10.5 K/uL 7.2  6.8  8.2   Hemoglobin 12.0 - 15.0 g/dL 9.7  9.9  9.9   Hematocrit 36.0 - 46.0 % 28.5  29.3  28.6   Platelets 150 - 400 K/uL 386  390  413       Latest Ref Rng & Units 10/25/2022    10:05 AM 07/26/2022    2:27 PM 07/19/2022    1:53 PM  CMP  Glucose 70 - 99 mg/dL 409  811  914   BUN 8 - 23 mg/dL 18  28  37   Creatinine 0.44 - 1.00 mg/dL 7.82  9.56  2.13   Sodium 135 - 145 mmol/L 138  138  136   Potassium 3.5 - 5.1 mmol/L 3.5  3.3  3.5   Chloride 98 - 111 mmol/L 108  101  102   CO2 22 - 32 mmol/L 21  26  26    Calcium 8.9 - 10.3 mg/dL 8.7  8.7  8.6   Total Protein 6.5 - 8.1 g/dL 5.9  6.3  6.3   Total Bilirubin 0.3 - 1.2 mg/dL 1.6  1.9  2.0   Alkaline Phos 38 - 126 U/L 341  345  356   AST 15 - 41 U/L 31  30  33   ALT 0 - 44 U/L 21  15  20       Iron/TIBC/Ferritin/ %Sat  Component Value Date/Time   IRON 46 06/21/2022 0947   TIBC 211 (L) 06/21/2022 0947   FERRITIN 429 (H) 06/21/2022 0947   IRONPCTSAT 22 06/21/2022 0947      RADIOGRAPHIC STUDIES: I have personally reviewed the radiological images as listed and agreed with the findings in the report. No results found.

## 2022-10-25 NOTE — Assessment & Plan Note (Signed)
This is likely due to generalized anasarca, portal hypertension, hypoalbuminemia  continue diuretics Negative DVT on ultrasound. 

## 2022-10-25 NOTE — Assessment & Plan Note (Addendum)
Lab Results  Component Value Date   HGB 9.7 (L) 10/25/2022   TIBC 209 (L) 10/25/2022   IRONPCTSAT 24 10/25/2022   FERRITIN 443 (H) 10/25/2022    Hemoglobin is stable. Monitor.

## 2022-10-25 NOTE — Progress Notes (Signed)
Pt here for follow up. Pt has medication question for Dr. Cathie Hoops.

## 2022-10-25 NOTE — Assessment & Plan Note (Signed)
Patient declines paracentesis 

## 2022-10-25 NOTE — Assessment & Plan Note (Signed)
Is likely secondary to chronic tosemide use.  Patient is also spironolactone.  Potassium is stable  Continue monitor. 

## 2022-10-25 NOTE — Assessment & Plan Note (Addendum)
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  Labs are reviewed and discussed with patient. Stable/slightly improved bilirubin level. Amyloidosis labs are pending.  I recommend patient to consider resuming Daratumumab maintenance. She declined treatment today.  She agrees to return in 4 weeks and further discuss.

## 2022-10-25 NOTE — Assessment & Plan Note (Signed)
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  Patient declines. Bilirubin is stable.  Continue Torsemide, Spirolactone, follow up with Hepatology Dr. Guido Sander.

## 2022-10-26 LAB — KAPPA/LAMBDA LIGHT CHAINS
Kappa free light chain: 16.8 mg/L (ref 3.3–19.4)
Kappa, lambda light chain ratio: 1.35 (ref 0.26–1.65)
Lambda free light chains: 12.4 mg/L (ref 5.7–26.3)

## 2022-10-26 LAB — BETA 2 MICROGLOBULIN, SERUM: Beta-2 Microglobulin: 3.3 mg/L — ABNORMAL HIGH (ref 0.6–2.4)

## 2022-10-30 LAB — MULTIPLE MYELOMA PANEL, SERUM
Albumin SerPl Elph-Mcnc: 2.7 g/dL — ABNORMAL LOW (ref 2.9–4.4)
Albumin/Glob SerPl: 1.1 (ref 0.7–1.7)
Alpha 1: 0.4 g/dL (ref 0.0–0.4)
Alpha2 Glob SerPl Elph-Mcnc: 0.7 g/dL (ref 0.4–1.0)
B-Globulin SerPl Elph-Mcnc: 1.1 g/dL (ref 0.7–1.3)
Gamma Glob SerPl Elph-Mcnc: 0.4 g/dL (ref 0.4–1.8)
Globulin, Total: 2.5 g/dL (ref 2.2–3.9)
IgA: 105 mg/dL (ref 64–422)
IgG (Immunoglobin G), Serum: 586 mg/dL (ref 586–1602)
IgM (Immunoglobulin M), Srm: 54 mg/dL (ref 26–217)
Total Protein ELP: 5.2 g/dL — ABNORMAL LOW (ref 6.0–8.5)

## 2022-11-14 ENCOUNTER — Telehealth: Payer: Self-pay | Admitting: *Deleted

## 2022-11-14 NOTE — Telephone Encounter (Signed)
Returned pt call stating that she was calling regarding lab appt this morning at 845am. RN called and got pt voicemail, message left stating that there was not an appt scheduled for her at cancer center today.  Next appt is scheduled for Thurs Nov 22, 2022 for lab at 1045a and Dr Cathie Hoops at 1100a.  Nurse stated to call back if she had any further questions.

## 2022-11-22 ENCOUNTER — Inpatient Hospital Stay: Payer: Medicare Other | Admitting: Oncology

## 2022-11-22 ENCOUNTER — Inpatient Hospital Stay: Payer: Medicare Other | Attending: Oncology

## 2022-11-27 ENCOUNTER — Inpatient Hospital Stay: Payer: Medicare Other

## 2022-11-27 ENCOUNTER — Inpatient Hospital Stay: Payer: Medicare Other | Admitting: Oncology

## 2022-12-12 ENCOUNTER — Other Ambulatory Visit: Payer: Self-pay

## 2022-12-12 NOTE — Progress Notes (Signed)
Error

## 2022-12-28 ENCOUNTER — Encounter: Payer: Self-pay | Admitting: Oncology

## 2022-12-28 ENCOUNTER — Inpatient Hospital Stay (HOSPITAL_BASED_OUTPATIENT_CLINIC_OR_DEPARTMENT_OTHER): Payer: Medicare Other | Admitting: Oncology

## 2022-12-28 ENCOUNTER — Inpatient Hospital Stay: Payer: Medicare Other | Attending: Oncology

## 2022-12-28 VITALS — BP 146/82 | HR 77 | Temp 96.1°F | Resp 16 | Wt 149.8 lb

## 2022-12-28 DIAGNOSIS — N1831 Chronic kidney disease, stage 3a: Secondary | ICD-10-CM | POA: Insufficient documentation

## 2022-12-28 DIAGNOSIS — Z794 Long term (current) use of insulin: Secondary | ICD-10-CM | POA: Insufficient documentation

## 2022-12-28 DIAGNOSIS — R188 Other ascites: Secondary | ICD-10-CM

## 2022-12-28 DIAGNOSIS — Z79624 Long term (current) use of inhibitors of nucleotide synthesis: Secondary | ICD-10-CM | POA: Diagnosis not present

## 2022-12-28 DIAGNOSIS — E8581 Light chain (AL) amyloidosis: Secondary | ICD-10-CM

## 2022-12-28 DIAGNOSIS — E876 Hypokalemia: Secondary | ICD-10-CM | POA: Insufficient documentation

## 2022-12-28 DIAGNOSIS — Z7961 Long term (current) use of immunomodulator: Secondary | ICD-10-CM | POA: Diagnosis not present

## 2022-12-28 DIAGNOSIS — C9000 Multiple myeloma not having achieved remission: Secondary | ICD-10-CM | POA: Diagnosis not present

## 2022-12-28 DIAGNOSIS — D631 Anemia in chronic kidney disease: Secondary | ICD-10-CM | POA: Diagnosis not present

## 2022-12-28 DIAGNOSIS — I130 Hypertensive heart and chronic kidney disease with heart failure and stage 1 through stage 4 chronic kidney disease, or unspecified chronic kidney disease: Secondary | ICD-10-CM | POA: Diagnosis not present

## 2022-12-28 DIAGNOSIS — D500 Iron deficiency anemia secondary to blood loss (chronic): Secondary | ICD-10-CM

## 2022-12-28 DIAGNOSIS — Z79899 Other long term (current) drug therapy: Secondary | ICD-10-CM | POA: Insufficient documentation

## 2022-12-28 DIAGNOSIS — E1122 Type 2 diabetes mellitus with diabetic chronic kidney disease: Secondary | ICD-10-CM | POA: Insufficient documentation

## 2022-12-28 DIAGNOSIS — E859 Amyloidosis, unspecified: Secondary | ICD-10-CM

## 2022-12-28 DIAGNOSIS — K77 Liver disorders in diseases classified elsewhere: Secondary | ICD-10-CM

## 2022-12-28 DIAGNOSIS — E854 Organ-limited amyloidosis: Secondary | ICD-10-CM

## 2022-12-28 LAB — CBC WITH DIFFERENTIAL (CANCER CENTER ONLY)
Abs Immature Granulocytes: 0.04 10*3/uL (ref 0.00–0.07)
Basophils Absolute: 0 10*3/uL (ref 0.0–0.1)
Basophils Relative: 1 %
Eosinophils Absolute: 0.2 10*3/uL (ref 0.0–0.5)
Eosinophils Relative: 2 %
HCT: 33.6 % — ABNORMAL LOW (ref 36.0–46.0)
Hemoglobin: 11.3 g/dL — ABNORMAL LOW (ref 12.0–15.0)
Immature Granulocytes: 1 %
Lymphocytes Relative: 21 %
Lymphs Abs: 1.6 10*3/uL (ref 0.7–4.0)
MCH: 25.6 pg — ABNORMAL LOW (ref 26.0–34.0)
MCHC: 33.6 g/dL (ref 30.0–36.0)
MCV: 76.2 fL — ABNORMAL LOW (ref 80.0–100.0)
Monocytes Absolute: 0.5 10*3/uL (ref 0.1–1.0)
Monocytes Relative: 7 %
Neutro Abs: 5.2 10*3/uL (ref 1.7–7.7)
Neutrophils Relative %: 68 %
Platelet Count: 408 10*3/uL — ABNORMAL HIGH (ref 150–400)
RBC: 4.41 MIL/uL (ref 3.87–5.11)
RDW: 21.6 % — ABNORMAL HIGH (ref 11.5–15.5)
WBC Count: 7.6 10*3/uL (ref 4.0–10.5)
nRBC: 0 % (ref 0.0–0.2)

## 2022-12-28 LAB — CMP (CANCER CENTER ONLY)
ALT: 26 U/L (ref 0–44)
AST: 35 U/L (ref 15–41)
Albumin: 2.8 g/dL — ABNORMAL LOW (ref 3.5–5.0)
Alkaline Phosphatase: 354 U/L — ABNORMAL HIGH (ref 38–126)
Anion gap: 7 (ref 5–15)
BUN: 22 mg/dL (ref 8–23)
CO2: 24 mmol/L (ref 22–32)
Calcium: 8.8 mg/dL — ABNORMAL LOW (ref 8.9–10.3)
Chloride: 106 mmol/L (ref 98–111)
Creatinine: 0.98 mg/dL (ref 0.44–1.00)
GFR, Estimated: 59 mL/min — ABNORMAL LOW (ref >60)
Glucose, Bld: 157 mg/dL — ABNORMAL HIGH (ref 70–99)
Potassium: 3.7 mmol/L (ref 3.5–5.1)
Sodium: 137 mmol/L (ref 135–145)
Total Bilirubin: 1.3 mg/dL — ABNORMAL HIGH (ref 0.3–1.2)
Total Protein: 6 g/dL — ABNORMAL LOW (ref 6.5–8.1)

## 2022-12-28 MED ORDER — FERROUS SULFATE 325 (65 FE) MG PO TABS: 325.0000 mg | ORAL_TABLET | Freq: Every day | ORAL | 2 refills | Status: DC

## 2022-12-28 NOTE — Assessment & Plan Note (Signed)
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  Labs are reviewed and discussed with patient. Stable/slightly improved bilirubin level. Amyloidosis labs are pending.  She has poor performance status, I recommend to continue observation. She agrees with the plan.

## 2022-12-28 NOTE — Progress Notes (Signed)
Pt in for follow up, reports fell recently and is having right hip and left shoulder pain.  Pt reports needing refills on ferrous sulfate and tramadol.

## 2022-12-28 NOTE — Assessment & Plan Note (Signed)
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  Patient declines. Bilirubin is stable.  Continue Torsemide, Spirolactone, follow up with Hepatology Dr. Guido Sander.

## 2022-12-28 NOTE — Assessment & Plan Note (Signed)
Lab Results  Component Value Date   HGB 11.3 (L) 12/28/2022   TIBC 209 (L) 10/25/2022   IRONPCTSAT 24 10/25/2022   FERRITIN 443 (H) 10/25/2022    Hemoglobin has improved.

## 2022-12-28 NOTE — Assessment & Plan Note (Signed)
Patient declines paracentesis

## 2022-12-28 NOTE — Progress Notes (Signed)
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  Labs are reviewed and discussed with patient. Stable/slightly improved bilirubin level. Amyloidosis labs are pending.  She has poor performance status, I recommend to continue observation. She agrees with the plan.   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HCC) Lab Results  Component Value Date   HGB 11.3 (L) 12/28/2022   TIBC 209 (L) 10/25/2022   IRONPCTSAT 24 10/25/2022   FERRITIN 443 (H) 10/25/2022    Hemoglobin has improved.   Hypokalemia Is likely secondary to chronic tosemide use.  Patient is also spironolactone.  Potassium is stable  Continue monitor.  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  Patient declines. Bilirubin is stable.  Continue Torsemide, Spirolactone, follow up with Hepatology Dr. Guido Sander.   Ascites Patient declines paracentesis    Orders Placed This Encounter  Procedures   CBC with Differential (Cancer Center Only)    Standing Status:   Future    Standing Expiration Date:   12/28/2023   CMP (Cancer Center only)    Standing Status:   Future    Standing Expiration Date:   12/28/2023   Multiple Myeloma Panel (SPEP&IFE w/QIG)    Standing Status:   Future    Standing Expiration Date:   12/28/2023   Beta 2 microglobulin, serum    Standing Status:   Future    Standing Expiration Date:   12/28/2023   Kappa/lambda light chains    Standing Status:   Future    Standing Expiration Date:   12/28/2023    Follow-up 3 months.  All questions were answered. The patient knows to call the clinic with any problems, questions or concerns.  Rickard Patience, MD, PhD [facility] Hematology Oncology 12/28/2022   HISTORY OF PRESENTING ILLNESS:   Nancy Rodriguez is a  77 y.o.  female presents for follow up of amyloidosis.   Reviewed her previous medical records via care everywhere.  She was seen by Hematology Oncology at [facility] on 02/14/2017.  07/25/2007  SPEP showed M protein of 0.35, IFE showed IgG lamda 09/22/2007  Bone survey negative.  02/15/14 IgG 930, SPEP M protien 0.22, free kappa light chain 3.59, lamda 2.92, ratio 1.23  Hypercalcemia, resolved after stopping HCTZ.  # Transaminitis 03/28/20 US abdomen showed left liver lobe Complex 1.7 cm cyst  # 04/07/20 MRI abdomen w/wo contrast showed 2 benign liver cysts, 2 nonspecific hypovascular irighr liver lesions, abnormal bone marrow signal of L1 vertebral body. Patient was  advised to proceed with bone marrow biopsy and she declined.  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  Patient changed her mind and ask a biopsy to be canceled.  My team and I had multiple phone discussion with patient's daughter [name] and later with the patient's son Fayrene Fearing.  Patient agreed with bone marrow biopsy and a biopsy was scheduled on 06/09/2020.  05/20/20 NT proBNP  was normal,  TSH slightly increased, normal T4 Normal factor X Normal coags Troponin 19. Refer to cardiology for evaluation. May need cardiac MRI  # 06/01/2020 2D echo result was reviewed and  discussed with patient.  Patient has normal LVEF 60-65%. Mild asymmetric left ventricular hypertrophy.  Left ventricular diastolic parameters are indeterminate. average left ventricular global longitudinal strain is -16.8 %.  # 06/09/2020, bone marrow biopsy showed monoclonal plasmacytosis, 8%, amyloid deposit present.  Absent iron stores. Myeloma FISH panel showed IgH rearrangement (not to CCND1or MAF or  FGFR3) or Trisomy 14. t (14;20) #06/17/2020, further discussed about diagnosis and treatment plan.  Patient declined bone marrow transplant evaluation.  Decision was made to proceed with Dara-CyborD chemotherapy treatments. #May 2022, second opinion at [facility] with Dr. Pandora Leiter.who agrees with current treatment plan.  He recommends to lower dexamethasone to 20 mg and decrease premed Tylenol to 325 mg  09/01/2020 cardiac MRI was performed at [facility].  Left ventricle is normal in size.  Mild to moderate basal septal hypertrophy.  Hyper dynamic LV function.  LVEF 71%.  Right ventricle is normal in size and wall thickness.  Systolic function normal.  Mild bi atrial enlargement, no significant aortic valve stenosis or regurgitation.  Trivial mitral regurgitation, mild tricuspid regurgitation and a trivial pulmonic regurgitation.  No evidence of MI, scarring or infiltration.  #History of major depression listed in outside problem list.  previous diagnosis and treatment details are not available to me in EMR  I have referred her to psychiatrist  #  vaginal bleeding which stopped- she was seen by GYN and recommended for biopsy.   # She has poor IV access and was not able to receive IV Emend and IV Aloxi. She does not want to have med port.   # GERD   Dr. Sampson Goon has increased her omeprazole to 40 mg daily. She feels symptoms are improved.  # 10/08/2020, cervical spine showed cervical spondylosis.  Spinal canal diagnosis and a posterior disc osteophyte complex and superimposed small central disc protrusion contact the ventral spinal cord at C4-C5.  Multilevel neuroforaminal narrowing. bilateral atlantooccipital joint effusions, multilevel grade 1 spondylolisthesis. MRI lumbar spine without contrast showed acute or subacute fracture of L4.   Moderate spinal stenosis at L3-4 has progressed. Shallow left foraminal disc protrusion has progressed. Moderate subarticular stenosis on the left also with progression  Moderate  spinal stenosis L4-5 with progression. Moderate subarticular stenosis on the left with progression  # 10/25/2020 seen by [facility] oncology Dr.Kang who recommend patient to switch Dara CyBorD to Dara RD  # 11/03/2020 Revlimid (lenalidomide) 10 mg by mouth daily for 14 days, then hold for 7 days. Pt was started on a shortened cycle in order to remain on similar schedule with infusion.   # 11/22/2020 seen by hepatologist Dr.Kappus Molli Hazard. Recommended to start Zoloft 50mg  daily for pruritis # 11/24/2020 cycle 2 Revlimid 10 mg, she finished 14 days.  Rest of the course was held due to anemia.  Patient declined blood transfusion.  # Vaginal bleeding  patient was seen by Dr. Dalbert Garnet,  on megace 40mg  daily. 12/22/2020 repeat EMBx - not diagnostic due to insufficient squamous component.   03/03/2021, ultrasound abdomen complete showed possible hypoechoic mass in the pancreatic tail.  1.8 x 1.2 x 1.4 cm.  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  Cholelithiasis.  MRI has been ordered for further evaluation and is scheduled.  + Falls and presented to [facility].  Patient was found to have marked hyperglycemia, AKI, UTI. 05/03/2021, CT head showed chronic appearing subdural hematoma, measuring up to 1.6 on the left and 0.9 on the right. 05/18/2021, patient was seen by cardiology Dr.End, patient was recommended  to continue torsemide 20 mg daily as needed for edema.  Patient was recommended to discontinue aspirin 81 mg due to frequent falls and chronic subdural hematoma.  Patient has not had further falls.  She has home physical therapy  + Chronic diarrhea, GI Dr.Kappus recommended endoscopy evaluation and she declined.  INTERVAL HISTORY JOLETTA PERRIER is a 77 y.o. female who has above history reviewed by me today presents for follow up visit for management of AL amyloidosis Patient was accompanied by her daughter today.  Weight is stable. .  Patient continues to have bilateral lower  extremity swelling/edema.  Chronic abdomen distention. She takes Diuretics.  She has occasional cough, denies SOB.     Review of Systems  Constitutional:  Positive for fatigue. Negative for appetite change, chills, fever and unexpected weight change.  HENT:   Negative for hearing loss, nosebleeds and voice change.   Eyes:  Negative for eye problems and icterus.  Respiratory:  Negative for chest tightness and cough.   Cardiovascular:  Positive for leg swelling. Negative for chest pain.  Gastrointestinal:  Positive for abdominal distention and diarrhea. Negative for abdominal pain, blood in stool and nausea.  Endocrine: Negative for hot flashes.  Genitourinary:  Negative for difficulty urinating and frequency.   Musculoskeletal:  Positive for back pain. Negative for arthralgias.  Skin:  Negative for itching and rash.  Neurological:  Positive for numbness. Negative for extremity weakness.  Hematological:  Negative for adenopathy.  Psychiatric/Behavioral:  Negative for confusion.     MEDICAL HISTORY:  Past Medical History:  Diagnosis Date   (HFpEF) heart failure with preserved ejection fraction (HCC)    a. 05/2020 Echo: EF 60-65%; b. 08/2020 cMRI ([facility]): EF 71%, no delayed hyperenhancement to suggest scar/infiltrative dzs. Nl RV fxn. Mild BAE. Triv MR, mild TR; c. 06/2021 Echo: EF 50-55%, no rwma, mild basal-septal LVH, GrII DD. Nl RV size/fxn. Mild MR. Ao sclerosis.   AL amyloidosis (HCC)    Anemia due to chronic kidney disease    Anemia in chronic kidney disease   CKD (chronic kidney disease), stage III (HCC)    Diabetes mellitus without complication (HCC)    History of cardiovascular stress test    a. 03/2012 Stress Echo([facility]): nl study.   Hypercholesterolemia    Hypertension    MGUS (monoclonal gammopathy of unknown significance)    Osteoarthritis    Rheumatoid arthritis (HCC)     SURGICAL HISTORY: Past Surgical History:  Procedure Laterality Date   HERNIA REPAIR      INTRAMEDULLARY (IM) NAIL INTERTROCHANTERIC Right 09/26/2021   Procedure: INTRAMEDULLARY (IM) NAIL INTERTROCHANTRIC;  Surgeon: Juanell Fairly, MD;  Location: [facility] ORS;  Service: Orthopedics;  Laterality: Right;   PARATHYROIDECTOMY      SOCIAL HISTORY: Social History   Socioeconomic History   Marital status: Widowed    Spouse name: Not on file   Number of children: 6   Years of education: Not on file   Highest education level: Not on file  Occupational History   Not on file  Tobacco Use   Smoking status: Never   Smokeless tobacco: Never  Vaping Use   Vaping status: Never Used  Substance and Sexual Activity   Alcohol use: No   Drug use: Never   Sexual activity: Not on file  Other Topics Concern   Not on file  Social History Narrative   Not on file   Social Determinants of Health   Financial Resource Strain: Low Risk  (11/21/2022)   Received  from [facility]   Overall Financial Resource Strain (CARDIA)    Difficulty of Paying Living Expenses: Not hard at all  Food Insecurity: No Food Insecurity (11/21/2022)   Received from [facility]   Hunger Vital Sign    Worried About Running Out of Food in the Last Year: Never true    Ran Out of Food in the Last Year: Never true  Transportation Needs: No Transportation Needs (11/21/2022)   Received from [facility]    In the past 12 months, has lack of transportation kept you from medical appointments or from getting medications?: No    Lack of Transportation (Non-Medical): No  Physical Activity: Not on file  Stress: Not on file  Social Connections: Not on file  Intimate Partner Violence: Not on file    FAMILY HISTORY: Family History  Problem Relation Age of Onset   Diabetes Daughter    Diabetes Son    Dementia Mother    Cancer Father    Esophageal cancer Sister    Brain cancer Brother     ALLERGIES:  is allergic to benazepril, lisinopril,  tolmetin, and nsaids.  MEDICATIONS:  Current Outpatient Medications  Medication Sig Dispense Refill   acyclovir (ZOVIRAX) 400 MG tablet Take 1 tablet (400 mg total) by mouth 2 (two) times daily. 60 tablet 2   albuterol (VENTOLIN HFA) 108 (90 Base) MCG/ACT inhaler Inhale 2 puffs into the lungs every 4 (four) hours as needed for wheezing or shortness of breath.     amLODipine (NORVASC) 5 MG tablet Take 5 mg by mouth daily.     gabapentin (NEURONTIN) 100 MG capsule Take 100 mg by mouth 2 (two) times daily.     HUMALOG KWIKPEN 100 UNIT/ML KwikPen Inject into the skin 3 (three) times daily. Per sliding scale.     LANTUS SOLOSTAR 100 UNIT/ML Solostar Pen Inject 7 Units into the skin daily.     loperamide (IMODIUM) 2 MG capsule Take 1 capsule (2 mg total) by mouth See admin instructions. Initial: 4 mg, followed by 2 mg after each loose stool, maximum 16 mg within 24 hours. 60 capsule 0   metoprolol succinate (TOPROL-XL) 50 MG 24 hr tablet Take 50 mg by mouth daily.     omeprazole (PRILOSEC) 20 MG capsule Take 20 mg by mouth 2 (two) times daily.     polyethylene glycol (MIRALAX / GLYCOLAX) 17 g packet Take 17 g by mouth daily as needed for mild constipation. 14 each 0   sertraline (ZOLOFT) 25 MG tablet Take 50 mg by mouth daily.     spironolactone (ALDACTONE) 100 MG tablet Take 200 mg by mouth daily.     torsemide (DEMADEX) 20 MG tablet Take 20 mg by mouth daily as needed.     traMADol (ULTRAM) 50 MG tablet Take 1 tablet (50 mg total) by mouth every 12 (twelve) hours as needed for moderate pain. 10 tablet 0   EPINEPHrine 0.3 mg/0.3 mL IJ SOAJ injection Inject 0.3 mg into the muscle as needed. (Patient not taking: Reported on 06/21/2022)     ferrous sulfate 325 (65 FE) MG tablet Take 1 tablet (325 mg total) by mouth daily. 30 tablet 2   guaiFENesin-codeine 100-10 MG/5ML syrup Take by mouth. (Patient not taking: Reported on 10/25/2022)     No current facility-administered medications for this visit.      PHYSICAL EXAMINATION: ECOG PERFORMANCE STATUS: 2 - Symptomatic, <50% confined to bed Vitals:  12/28/22 1128  BP: (!) 146/82  Pulse: 77  Resp: 16  Temp: (!) 96.1 F (35.6 C)  SpO2: 97%   Filed Weights   12/28/22 1128  Weight: 149 lb 12.8 oz (67.9 kg)    Physical Exam Constitutional:      General: She is not in acute distress.    Appearance: She is ill-appearing.  HENT:     Head: Normocephalic and atraumatic.  Eyes:     General: No scleral icterus. Cardiovascular:     Rate and Rhythm: Normal rate and regular rhythm.     Heart sounds: Murmur heard.  Pulmonary:     Effort: Pulmonary effort is normal. No respiratory distress.     Breath sounds: No wheezing.  Abdominal:     General: Bowel sounds are normal. There is distension.     Palpations: Abdomen is soft.  Musculoskeletal:        General: No deformity. Normal range of motion.     Cervical back: Normal range of motion and neck supple.     Right lower leg: Edema present.     Left lower leg: Edema present.  Skin:    General: Skin is warm and dry.     Findings: No erythema or rash.     Comments: Several subcentimeter subcutaneous firm nodules on abdomen wall  Neurological:     Mental Status: She is alert. Mental status is at baseline.     Cranial Nerves: No cranial nerve deficit.     Coordination: Coordination normal.  Psychiatric:        Mood and Affect: Mood normal.     LABORATORY DATA:  I have reviewed the data as listed    Latest Ref Rng & Units 12/28/2022   10:51 AM 10/25/2022   10:05 AM 07/26/2022    2:27 PM  CBC  WBC 4.0 - 10.5 K/uL 7.6  7.2  6.8   Hemoglobin 12.0 - 15.0 g/dL 08.6  9.7  9.9   Hematocrit 36.0 - 46.0 % 33.6  28.5  29.3   Platelets 150 - 400 K/uL 408  386  390       Latest Ref Rng & Units 12/28/2022   10:51 AM 10/25/2022   10:05 AM 07/26/2022    2:27 PM  CMP  Glucose 70 - 99 mg/dL 578  469  629   BUN 8 - 23 mg/dL 22  18  28    Creatinine 0.44 - 1.00 mg/dL 5.28  4.13  2.44    Sodium 135 - 145 mmol/L 137  138  138   Potassium 3.5 - 5.1 mmol/L 3.7  3.5  3.3   Chloride 98 - 111 mmol/L 106  108  101   CO2 22 - 32 mmol/L 24  21  26    Calcium 8.9 - 10.3 mg/dL 8.8  8.7  8.7   Total Protein 6.5 - 8.1 g/dL 6.0  5.9  6.3   Total Bilirubin 0.3 - 1.2 mg/dL 1.3  1.6  1.9   Alkaline Phos 38 - 126 U/L 354  341  345   AST 15 - 41 U/L 35  31  30   ALT 0 - 44 U/L 26  21  15       Iron/TIBC/Ferritin/ %Sat    Component Value Date/Time   IRON 51 10/25/2022 1005   TIBC 209 (L) 10/25/2022 1005   FERRITIN 443 (H) 10/25/2022 1005   IRONPCTSAT 24 10/25/2022 1005      RADIOGRAPHIC STUDIES: I have personally reviewed  the radiological images as listed and agreed with the findings in the report. No results found.

## 2022-12-28 NOTE — Assessment & Plan Note (Signed)
Is likely secondary to chronic tosemide use.  Patient is also spironolactone.  Potassium is stable  Continue monitor.

## 2022-12-29 LAB — BETA 2 MICROGLOBULIN, SERUM: Beta-2 Microglobulin: 3.7 mg/L — ABNORMAL HIGH (ref 0.6–2.4)

## 2022-12-31 LAB — KAPPA/LAMBDA LIGHT CHAINS
Kappa free light chain: 20.6 mg/L — ABNORMAL HIGH (ref 3.3–19.4)
Kappa, lambda light chain ratio: 1.51 (ref 0.26–1.65)
Lambda free light chains: 13.6 mg/L (ref 5.7–26.3)

## 2023-01-01 LAB — MULTIPLE MYELOMA PANEL, SERUM
Albumin SerPl Elph-Mcnc: 2.6 g/dL — ABNORMAL LOW (ref 2.9–4.4)
Albumin/Glob SerPl: 0.9 (ref 0.7–1.7)
Alpha 1: 0.4 g/dL (ref 0.0–0.4)
Alpha2 Glob SerPl Elph-Mcnc: 0.8 g/dL (ref 0.4–1.0)
B-Globulin SerPl Elph-Mcnc: 1.1 g/dL (ref 0.7–1.3)
Gamma Glob SerPl Elph-Mcnc: 0.5 g/dL (ref 0.4–1.8)
Globulin, Total: 2.9 g/dL (ref 2.2–3.9)
IgA: 121 mg/dL (ref 64–422)
IgG (Immunoglobin G), Serum: 574 mg/dL — ABNORMAL LOW (ref 586–1602)
IgM (Immunoglobulin M), Srm: 65 mg/dL (ref 26–217)
Total Protein ELP: 5.5 g/dL — ABNORMAL LOW (ref 6.0–8.5)

## 2023-01-04 ENCOUNTER — Other Ambulatory Visit: Payer: Self-pay | Admitting: Oncology

## 2023-02-14 ENCOUNTER — Other Ambulatory Visit: Payer: Self-pay | Admitting: Infectious Diseases

## 2023-02-14 DIAGNOSIS — C9000 Multiple myeloma not having achieved remission: Secondary | ICD-10-CM

## 2023-02-14 DIAGNOSIS — E859 Amyloidosis, unspecified: Secondary | ICD-10-CM

## 2023-02-14 DIAGNOSIS — K766 Portal hypertension: Secondary | ICD-10-CM

## 2023-02-14 DIAGNOSIS — K7460 Unspecified cirrhosis of liver: Secondary | ICD-10-CM

## 2023-02-14 DIAGNOSIS — R600 Localized edema: Secondary | ICD-10-CM

## 2023-02-17 ENCOUNTER — Other Ambulatory Visit: Payer: Self-pay | Admitting: Oncology

## 2023-02-20 ENCOUNTER — Encounter: Payer: Self-pay | Admitting: Oncology

## 2023-02-25 ENCOUNTER — Ambulatory Visit
Admission: RE | Admit: 2023-02-25 | Discharge: 2023-02-25 | Disposition: A | Discharge status: Home / Self Care | Payer: Medicare Other | Source: Ambulatory Visit | Attending: Infectious Diseases | Admitting: Infectious Diseases

## 2023-02-25 DIAGNOSIS — E859 Amyloidosis, unspecified: Secondary | ICD-10-CM | POA: Insufficient documentation

## 2023-02-25 DIAGNOSIS — K766 Portal hypertension: Secondary | ICD-10-CM | POA: Insufficient documentation

## 2023-02-25 DIAGNOSIS — R600 Localized edema: Secondary | ICD-10-CM | POA: Insufficient documentation

## 2023-02-25 DIAGNOSIS — K7460 Unspecified cirrhosis of liver: Secondary | ICD-10-CM | POA: Diagnosis present

## 2023-02-25 DIAGNOSIS — C9000 Multiple myeloma not having achieved remission: Secondary | ICD-10-CM | POA: Diagnosis present

## 2023-04-02 ENCOUNTER — Inpatient Hospital Stay: Payer: Medicare Other | Admitting: Oncology

## 2023-04-02 ENCOUNTER — Encounter: Payer: Self-pay | Admitting: Oncology

## 2023-04-02 ENCOUNTER — Inpatient Hospital Stay: Payer: Medicare Other | Attending: Oncology

## 2023-04-18 ENCOUNTER — Encounter: Payer: Self-pay | Admitting: Oncology

## 2023-04-22 ENCOUNTER — Other Ambulatory Visit: Payer: Self-pay | Admitting: Oncology

## 2023-04-22 NOTE — Telephone Encounter (Signed)
 I will defer need to refill this to Dr. Cathie Hoops.  Yvonne Kendall, MD [facility]

## 2023-04-26 ENCOUNTER — Other Ambulatory Visit: Payer: Self-pay | Admitting: Oncology

## 2023-04-30 ENCOUNTER — Other Ambulatory Visit: Payer: Self-pay

## 2023-04-30 ENCOUNTER — Inpatient Hospital Stay
Admission: EM | Admit: 2023-04-30 | Discharge: 2023-05-03 | DRG: 291 | Disposition: A | Discharge status: Home / Self Care | Payer: Medicare Other | Attending: Internal Medicine | Admitting: Internal Medicine

## 2023-04-30 ENCOUNTER — Inpatient Hospital Stay: Payer: Medicare Other

## 2023-04-30 ENCOUNTER — Emergency Department: Payer: Medicare Other

## 2023-04-30 DIAGNOSIS — Z794 Long term (current) use of insulin: Secondary | ICD-10-CM | POA: Diagnosis not present

## 2023-04-30 DIAGNOSIS — Z515 Encounter for palliative care: Secondary | ICD-10-CM

## 2023-04-30 DIAGNOSIS — M7989 Other specified soft tissue disorders: Secondary | ICD-10-CM | POA: Diagnosis present

## 2023-04-30 DIAGNOSIS — Z833 Family history of diabetes mellitus: Secondary | ICD-10-CM | POA: Diagnosis not present

## 2023-04-30 DIAGNOSIS — E8581 Light chain (AL) amyloidosis: Secondary | ICD-10-CM | POA: Diagnosis present

## 2023-04-30 DIAGNOSIS — I43 Cardiomyopathy in diseases classified elsewhere: Secondary | ICD-10-CM | POA: Diagnosis present

## 2023-04-30 DIAGNOSIS — Z9221 Personal history of antineoplastic chemotherapy: Secondary | ICD-10-CM

## 2023-04-30 DIAGNOSIS — J9602 Acute respiratory failure with hypercapnia: Secondary | ICD-10-CM | POA: Diagnosis present

## 2023-04-30 DIAGNOSIS — M79602 Pain in left arm: Secondary | ICD-10-CM | POA: Diagnosis present

## 2023-04-30 DIAGNOSIS — R188 Other ascites: Secondary | ICD-10-CM | POA: Diagnosis present

## 2023-04-30 DIAGNOSIS — K7460 Unspecified cirrhosis of liver: Secondary | ICD-10-CM | POA: Diagnosis present

## 2023-04-30 DIAGNOSIS — E7800 Pure hypercholesterolemia, unspecified: Secondary | ICD-10-CM | POA: Diagnosis present

## 2023-04-30 DIAGNOSIS — I130 Hypertensive heart and chronic kidney disease with heart failure and stage 1 through stage 4 chronic kidney disease, or unspecified chronic kidney disease: Principal | ICD-10-CM | POA: Diagnosis present

## 2023-04-30 DIAGNOSIS — R54 Age-related physical debility: Secondary | ICD-10-CM | POA: Diagnosis present

## 2023-04-30 DIAGNOSIS — Z800 Family history of malignant neoplasm of digestive organs: Secondary | ICD-10-CM | POA: Diagnosis not present

## 2023-04-30 DIAGNOSIS — M069 Rheumatoid arthritis, unspecified: Secondary | ICD-10-CM | POA: Diagnosis present

## 2023-04-30 DIAGNOSIS — N1832 Chronic kidney disease, stage 3b: Secondary | ICD-10-CM | POA: Diagnosis present

## 2023-04-30 DIAGNOSIS — E1122 Type 2 diabetes mellitus with diabetic chronic kidney disease: Secondary | ICD-10-CM | POA: Diagnosis present

## 2023-04-30 DIAGNOSIS — Z808 Family history of malignant neoplasm of other organs or systems: Secondary | ICD-10-CM | POA: Diagnosis not present

## 2023-04-30 DIAGNOSIS — Z886 Allergy status to analgesic agent status: Secondary | ICD-10-CM

## 2023-04-30 DIAGNOSIS — Z888 Allergy status to other drugs, medicaments and biological substances status: Secondary | ICD-10-CM

## 2023-04-30 DIAGNOSIS — D631 Anemia in chronic kidney disease: Secondary | ICD-10-CM | POA: Diagnosis present

## 2023-04-30 DIAGNOSIS — I214 Non-ST elevation (NSTEMI) myocardial infarction: Secondary | ICD-10-CM

## 2023-04-30 DIAGNOSIS — J811 Chronic pulmonary edema: Secondary | ICD-10-CM | POA: Diagnosis present

## 2023-04-30 DIAGNOSIS — K766 Portal hypertension: Secondary | ICD-10-CM | POA: Diagnosis present

## 2023-04-30 DIAGNOSIS — E119 Type 2 diabetes mellitus without complications: Secondary | ICD-10-CM

## 2023-04-30 DIAGNOSIS — Z79899 Other long term (current) drug therapy: Secondary | ICD-10-CM | POA: Diagnosis not present

## 2023-04-30 DIAGNOSIS — I5043 Acute on chronic combined systolic (congestive) and diastolic (congestive) heart failure: Secondary | ICD-10-CM | POA: Diagnosis present

## 2023-04-30 DIAGNOSIS — K77 Liver disorders in diseases classified elsewhere: Secondary | ICD-10-CM | POA: Diagnosis not present

## 2023-04-30 DIAGNOSIS — I5023 Acute on chronic systolic (congestive) heart failure: Secondary | ICD-10-CM | POA: Diagnosis present

## 2023-04-30 DIAGNOSIS — J9601 Acute respiratory failure with hypoxia: Principal | ICD-10-CM

## 2023-04-30 DIAGNOSIS — I509 Heart failure, unspecified: Secondary | ICD-10-CM | POA: Diagnosis not present

## 2023-04-30 DIAGNOSIS — R140 Abdominal distension (gaseous): Secondary | ICD-10-CM | POA: Diagnosis present

## 2023-04-30 DIAGNOSIS — I2489 Other forms of acute ischemic heart disease: Secondary | ICD-10-CM | POA: Insufficient documentation

## 2023-04-30 DIAGNOSIS — I10 Essential (primary) hypertension: Secondary | ICD-10-CM | POA: Diagnosis not present

## 2023-04-30 DIAGNOSIS — E859 Amyloidosis, unspecified: Secondary | ICD-10-CM | POA: Diagnosis present

## 2023-04-30 DIAGNOSIS — E854 Organ-limited amyloidosis: Secondary | ICD-10-CM | POA: Diagnosis present

## 2023-04-30 DIAGNOSIS — Z882 Allergy status to sulfonamides status: Secondary | ICD-10-CM

## 2023-04-30 LAB — URINALYSIS, W/ REFLEX TO CULTURE (INFECTION SUSPECTED)
Bilirubin Urine: NEGATIVE
Glucose, UA: NEGATIVE mg/dL
Hgb urine dipstick: NEGATIVE
Ketones, ur: NEGATIVE mg/dL
Leukocytes,Ua: NEGATIVE
Nitrite: NEGATIVE
Protein, ur: >=300 mg/dL — AB
Specific Gravity, Urine: 1.008 (ref 1.005–1.030)
pH: 6 (ref 5.0–8.0)

## 2023-04-30 LAB — CBC WITH DIFFERENTIAL/PLATELET
Abs Immature Granulocytes: 0.06 10*3/uL (ref 0.00–0.07)
Basophils Absolute: 0.1 10*3/uL (ref 0.0–0.1)
Basophils Relative: 1 %
Eosinophils Absolute: 0.2 10*3/uL (ref 0.0–0.5)
Eosinophils Relative: 2 %
HCT: 38.6 % (ref 36.0–46.0)
Hemoglobin: 12.8 g/dL (ref 12.0–15.0)
Immature Granulocytes: 1 %
Lymphocytes Relative: 39 %
Lymphs Abs: 5 10*3/uL — ABNORMAL HIGH (ref 0.7–4.0)
MCH: 25.7 pg — ABNORMAL LOW (ref 26.0–34.0)
MCHC: 33.2 g/dL (ref 30.0–36.0)
MCV: 77.5 fL — ABNORMAL LOW (ref 80.0–100.0)
Monocytes Absolute: 0.8 10*3/uL (ref 0.1–1.0)
Monocytes Relative: 6 %
Neutro Abs: 6.7 10*3/uL (ref 1.7–7.7)
Neutrophils Relative %: 51 %
Platelets: 470 10*3/uL — ABNORMAL HIGH (ref 150–400)
RBC: 4.98 MIL/uL (ref 3.87–5.11)
RDW: 20.8 % — ABNORMAL HIGH (ref 11.5–15.5)
WBC: 12.9 10*3/uL — ABNORMAL HIGH (ref 4.0–10.5)
nRBC: 0 % (ref 0.0–0.2)

## 2023-04-30 LAB — LACTIC ACID, PLASMA
Lactic Acid, Venous: 2 mmol/L (ref 0.5–1.9)
Lactic Acid, Venous: 1.2 mmol/L (ref 0.5–1.9)

## 2023-04-30 LAB — BLOOD GAS, VENOUS
Acid-base deficit: 5.7 mmol/L — ABNORMAL HIGH (ref 0.0–2.0)
Bicarbonate: 21.5 mmol/L (ref 20.0–28.0)
Delivery systems: POSITIVE
FIO2: 50 %
O2 Saturation: 85.6 %
Patient temperature: 37
pCO2, Ven: 48 mmHg (ref 44–60)
pH, Ven: 7.26 (ref 7.25–7.43)
pO2, Ven: 59 mmHg — ABNORMAL HIGH (ref 32–45)

## 2023-04-30 LAB — COMPREHENSIVE METABOLIC PANEL
ALT: 21 U/L (ref 0–44)
AST: 37 U/L (ref 15–41)
Albumin: 3 g/dL — ABNORMAL LOW (ref 3.5–5.0)
Alkaline Phosphatase: 350 U/L — ABNORMAL HIGH (ref 38–126)
Anion gap: 14 (ref 5–15)
BUN: 36 mg/dL — ABNORMAL HIGH (ref 8–23)
CO2: 21 mmol/L — ABNORMAL LOW (ref 22–32)
Calcium: 9.2 mg/dL (ref 8.9–10.3)
Chloride: 105 mmol/L (ref 98–111)
Creatinine, Ser: 1.44 mg/dL — ABNORMAL HIGH (ref 0.44–1.00)
GFR, Estimated: 37 mL/min — ABNORMAL LOW (ref >60)
Glucose, Bld: 154 mg/dL — ABNORMAL HIGH (ref 70–99)
Potassium: 4.5 mmol/L (ref 3.5–5.1)
Sodium: 140 mmol/L (ref 135–145)
Total Bilirubin: 1.3 mg/dL — ABNORMAL HIGH (ref 0.0–1.2)
Total Protein: 7.2 g/dL (ref 6.5–8.1)

## 2023-04-30 LAB — BRAIN NATRIURETIC PEPTIDE: B Natriuretic Peptide: 2191 pg/mL — ABNORMAL HIGH (ref 0.0–100.0)

## 2023-04-30 LAB — MAGNESIUM: Magnesium: 1.6 mg/dL — ABNORMAL LOW (ref 1.7–2.4)

## 2023-04-30 LAB — CBG MONITORING, ED
Glucose-Capillary: 136 mg/dL — ABNORMAL HIGH (ref 70–99)
Glucose-Capillary: 119 mg/dL — ABNORMAL HIGH (ref 70–99)

## 2023-04-30 LAB — TROPONIN I (HIGH SENSITIVITY)
Troponin I (High Sensitivity): 68 ng/L — ABNORMAL HIGH (ref <18)
Troponin I (High Sensitivity): 210 ng/L (ref <18)
Troponin I (High Sensitivity): 211 ng/L (ref <18)

## 2023-04-30 MED ORDER — SODIUM CHLORIDE 0.9 % IV SOLN
500.0000 mg | Freq: Once | INTRAVENOUS | Status: AC
  Administered 2023-04-30: 500 mg via INTRAVENOUS
  Filled 2023-04-30: qty 5

## 2023-04-30 MED ORDER — DEXTROSE 5 % IV SOLN
100.0000 mg | Freq: Two times a day (BID) | INTRAVENOUS | Status: DC
  Administered 2023-04-30 – 2023-05-01 (×2): 100 mg via INTRAVENOUS
  Filled 2023-04-30 (×3): qty 10

## 2023-04-30 MED ORDER — MAGNESIUM SULFATE 2 GM/50ML IV SOLN
2.0000 g | Freq: Once | INTRAVENOUS | Status: AC
  Administered 2023-04-30: 2 g via INTRAVENOUS
  Filled 2023-04-30: qty 50

## 2023-04-30 MED ORDER — FERROUS SULFATE 325 (65 FE) MG PO TABS
325.0000 mg | ORAL_TABLET | Freq: Every day | ORAL | Status: DC
Start: 2023-05-01 — End: 2023-05-03
  Administered 2023-05-01 – 2023-05-03 (×3): 325 mg via ORAL
  Filled 2023-04-30 (×3): qty 1

## 2023-04-30 MED ORDER — PANTOPRAZOLE SODIUM 40 MG PO TBEC
40.0000 mg | DELAYED_RELEASE_TABLET | Freq: Every day | ORAL | Status: DC
  Administered 2023-05-01 – 2023-05-03 (×3): 40 mg via ORAL
  Filled 2023-04-30 (×3): qty 1

## 2023-04-30 MED ORDER — NITROGLYCERIN IN D5W 200-5 MCG/ML-% IV SOLN
0.0000 ug/min | INTRAVENOUS | Status: DC
  Filled 2023-04-30: qty 250

## 2023-04-30 MED ORDER — SERTRALINE HCL 50 MG PO TABS
50.0000 mg | ORAL_TABLET | Freq: Every day | ORAL | Status: DC
  Administered 2023-05-01 – 2023-05-03 (×3): 50 mg via ORAL
  Filled 2023-04-30 (×4): qty 1

## 2023-04-30 MED ORDER — INSULIN ASPART 100 UNIT/ML IJ SOLN
0.0000 [IU] | Freq: Three times a day (TID) | INTRAMUSCULAR | Status: DC
  Administered 2023-04-30: 2 [IU] via SUBCUTANEOUS
  Administered 2023-05-01: 3 [IU] via SUBCUTANEOUS
  Administered 2023-05-01: 5 [IU] via SUBCUTANEOUS
  Administered 2023-05-02 (×2): 3 [IU] via SUBCUTANEOUS
  Filled 2023-04-30 (×5): qty 1

## 2023-04-30 MED ORDER — SODIUM CHLORIDE 0.9% FLUSH: 3.0000 mL | INTRAVENOUS | Status: DC | PRN

## 2023-04-30 MED ORDER — FUROSEMIDE 10 MG/ML IJ SOLN
120.0000 mg | Freq: Once | INTRAVENOUS | Status: AC
  Administered 2023-04-30: 120 mg via INTRAVENOUS
  Filled 2023-04-30: qty 10

## 2023-04-30 MED ORDER — SPIRONOLACTONE 25 MG PO TABS
200.0000 mg | ORAL_TABLET | Freq: Two times a day (BID) | ORAL | Status: DC
Start: 2023-05-01 — End: 2023-05-01

## 2023-04-30 MED ORDER — ENOXAPARIN SODIUM 30 MG/0.3ML IJ SOSY: 30.0000 mg | PREFILLED_SYRINGE | INTRAMUSCULAR | Status: DC

## 2023-04-30 MED ORDER — INSULIN GLARGINE-YFGN 100 UNIT/ML ~~LOC~~ SOLN
5.0000 [IU] | Freq: Every day | SUBCUTANEOUS | Status: DC
  Administered 2023-04-30 – 2023-05-02 (×3): 5 [IU] via SUBCUTANEOUS
  Filled 2023-04-30 (×4): qty 0.05

## 2023-04-30 MED ORDER — METOPROLOL SUCCINATE ER 50 MG PO TB24
50.0000 mg | ORAL_TABLET | Freq: Every day | ORAL | Status: DC
  Administered 2023-05-01 – 2023-05-03 (×3): 50 mg via ORAL
  Filled 2023-04-30 (×3): qty 1

## 2023-04-30 MED ORDER — ACETAMINOPHEN 325 MG PO TABS: 650.0000 mg | ORAL_TABLET | ORAL | Status: DC | PRN

## 2023-04-30 MED ORDER — SODIUM CHLORIDE 0.9 % IV SOLN
2.0000 g | Freq: Once | INTRAVENOUS | Status: AC
  Administered 2023-04-30: 2 g via INTRAVENOUS
  Filled 2023-04-30: qty 20

## 2023-04-30 MED ORDER — ONDANSETRON HCL 4 MG/2ML IJ SOLN
4.0000 mg | Freq: Four times a day (QID) | INTRAMUSCULAR | Status: DC | PRN
Start: 2023-04-30 — End: 2023-05-03

## 2023-04-30 MED ORDER — GABAPENTIN 100 MG PO CAPS
100.0000 mg | ORAL_CAPSULE | Freq: Two times a day (BID) | ORAL | Status: DC
  Administered 2023-04-30 – 2023-05-03 (×6): 100 mg via ORAL
  Filled 2023-04-30 (×6): qty 1

## 2023-04-30 MED ORDER — ENOXAPARIN SODIUM 30 MG/0.3ML IJ SOSY
30.0000 mg | PREFILLED_SYRINGE | INTRAMUSCULAR | Status: DC
  Administered 2023-04-30 – 2023-05-02 (×3): 30 mg via SUBCUTANEOUS
  Filled 2023-04-30 (×3): qty 0.3

## 2023-04-30 MED ORDER — TRAMADOL HCL 50 MG PO TABS
50.0000 mg | ORAL_TABLET | Freq: Two times a day (BID) | ORAL | Status: DC | PRN
  Administered 2023-04-30: 50 mg via ORAL
  Filled 2023-04-30: qty 1

## 2023-04-30 MED ORDER — SODIUM CHLORIDE 0.9% FLUSH
3.0000 mL | Freq: Two times a day (BID) | INTRAVENOUS | Status: DC
  Administered 2023-04-30 – 2023-05-03 (×5): 3 mL via INTRAVENOUS

## 2023-04-30 MED ORDER — ACYCLOVIR 200 MG PO CAPS
400.0000 mg | ORAL_CAPSULE | Freq: Two times a day (BID) | ORAL | Status: DC
Start: 2023-04-30 — End: 2023-05-03
  Administered 2023-04-30 – 2023-05-03 (×6): 400 mg via ORAL
  Filled 2023-04-30 (×6): qty 2

## 2023-04-30 MED ORDER — SODIUM CHLORIDE 0.9 % IV SOLN
250.0000 mL | INTRAVENOUS | Status: AC | PRN
Start: 2023-04-30 — End: 2023-05-01

## 2023-04-30 NOTE — Assessment & Plan Note (Addendum)
 On admission, Patient presenting with severe shortness of breath and hypoxic and hypercarbic respiratory failure suspected to be due to underlying heart disease and anasarca.  Low suspicion for infection due to lack of other symptoms such as fever, rhinorrhea, cough, etc. Continue BiPAP as needed. CT chest shows small bilateral pleural effusions. Was started on IV abx.  05-01-2023 my index of suspicion for pneumonia is low. Procal never done yesterday. Will order one now. If procal negative, will stop IV abx. Effusion look too small for thoracentesis.  05-02-2023 procal returned yesterday at 5.37. which is surprising to me. Repeat CXR today shows dramatic improvement in aeration and near resolution of her pulmonary edema. Have restarted IV rocephin  due to elevated procalcitonin but still not clear if pt has true pneumonia or not.  05-03-2023 does not qualify for home O2 even with ambulation. Weaned to RA. RA sats >=92% while working with PT. Pt's CXR showed improvement with diuresis. Abx were continued as a precaution but I still don't think she has/had pneumonia. Will not discharge her on abx.

## 2023-04-30 NOTE — Progress Notes (Signed)
 CODE SEPSIS - PHARMACY COMMUNICATION  **Broad Spectrum Antibiotics should be administered within 1 hour of Sepsis diagnosis**  Time Code Sepsis Called/Page Received: 1/14 @ 1132  Antibiotics Ordered:  Ceftriaxone  2g IV x 1 Azithromycin  500 x 1  Time of 1st antibiotic administration: 1/14 @ 1205  Additional action taken by pharmacy: NA  If necessary, Name of Provider/Nurse Contacted: NA  Nancy Rodriguez, PharmD Pharmacy Resident  04/30/2023 11:38 AM

## 2023-04-30 NOTE — H&P (Addendum)
 History and Physical    Patient: Nancy Rodriguez DOB: 01/17/1946 DOA: 04/30/2023 DOS: the patient was seen and examined on 04/30/2023 PCP: Epifanio Alm SQUIBB, MD  Patient coming from: Home  Chief Complaint:  Chief Complaint  Patient presents with   Shortness of Breath   HPI: Nancy Rodriguez is a 78 y.o. 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  History obtained from patient's daughter at bedside due to patient's sleepiness.  She states that patient has been experiencing bilateral lower extremity swelling that has been difficult to control for the last several weeks, however since her torsemide  was increased to 100 mg twice daily, symptoms improved before plateauing.  Then today, she developed sudden onset shortness of breath.  Nancy Rodriguez did not mention any chest pain or palpitations.  No recent illness, fever, chills, nausea, vomiting, diarrhea.  ED course: On arrival to the ED, patient was hypertensive at 141/78 with heart rate of 115.  She was saturating at 96% on BiPAP with 60% FiO2.  She was tachycardic at 115 with tachypnea 37/minute.  Initial VBG with pH of 7.17 and pCO2 of 62.Initial workup with WBC of 12.9, platelets of 470, bicarb 21, glucose 154, BUN 36, creatinine 1.44, alkaline phosphatase 350, and GFR of 37.  BNP 2191 with troponin of 68.  Lactic acid 2.0.  Chest x-ray was obtained that demonstrated bilateral airspace disease with suspected layering effusions.  Patient started on Lasix , Rocephin  and azithromycin .  [facility] contacted for admission.  Review of Systems: As mentioned in the history of present illness. All other systems reviewed and are negative.  Past Medical History:  Diagnosis Date   (HFpEF) heart failure with preserved ejection fraction  (HCC)    a. 05/2020 Echo: EF 60-65%; b. 08/2020 cMRI ([facility]): EF 71%, no delayed hyperenhancement to suggest scar/infiltrative dzs. Nl RV fxn. Mild BAE. Triv MR, mild TR; c. 06/2021 Echo: EF 50-55%, no rwma, mild basal-septal LVH, GrII DD. Nl RV size/fxn. Mild MR. Ao sclerosis.   AL amyloidosis (HCC)    Anemia due to chronic kidney disease    Anemia in chronic kidney disease   CKD (chronic kidney disease), stage III (HCC)    Diabetes mellitus without complication (HCC)    History of cardiovascular stress test    a. 03/2012 Stress Echo([facility]): nl study.   Hypercholesterolemia    Hypertension    MGUS (monoclonal gammopathy of unknown significance)    Osteoarthritis    Rheumatoid arthritis (HCC)    Past Surgical History:  Procedure Laterality Date   HERNIA REPAIR     INTRAMEDULLARY (IM) NAIL INTERTROCHANTERIC Right 09/26/2021   Procedure: INTRAMEDULLARY (IM) NAIL INTERTROCHANTRIC;  Surgeon: Marchia Drivers, MD;  Location: [facility] ORS;  Service: Orthopedics;  Laterality: Right;   PARATHYROIDECTOMY     Social History:  reports that she has never smoked. She has never used smokeless tobacco. She reports that she does not drink alcohol and does not use drugs.  Allergies  Allergen Reactions   Benazepril Anaphylaxis and Swelling    TONGUE AND LIPS     Lisinopril Swelling   Tolmetin Swelling   Nsaids Swelling    Family History  Problem Relation Age of Onset   Diabetes Daughter    Diabetes Son    Dementia Mother    Cancer Father  Esophageal cancer Sister    Brain cancer Brother     Prior to Admission medications   Medication Sig Start Date End Date Taking? Authorizing Provider  acyclovir  (ZOVIRAX ) 400 MG tablet Take 1 tablet (400 mg total) by mouth 2 (two) times daily. 06/23/21   Babara Call, MD  albuterol  (VENTOLIN  HFA) 108 [phone] Base) MCG/ACT inhaler Inhale 2 puffs into the lungs every 4 (four) hours as needed for wheezing or shortness of breath. 06/21/15   [provider]   amLODipine  (NORVASC ) 5 MG tablet Take 5 mg by mouth daily. 11/26/19   [provider]  EPINEPHrine 0.3 mg/0.3 mL IJ SOAJ injection Inject 0.3 mg into the muscle as needed. Patient not taking: Reported on 06/21/2022 05/06/09   [provider]  ferrous sulfate  325 (65 FE) MG tablet TAKE 1 TABLET BY MOUTH EVERY DAY 02/20/23   Babara Call, MD  gabapentin  (NEURONTIN ) 100 MG capsule Take 100 mg by mouth 2 (two) times daily.    [provider]  guaiFENesin-codeine 100-10 MG/5ML syrup Take by mouth. Patient not taking: Reported on 10/25/2022 07/25/22   [provider]  HUMALOG KWIKPEN 100 UNIT/ML KwikPen Inject into the skin 3 (three) times daily. Per sliding scale. 06/21/22   [provider]  LANTUS  SOLOSTAR 100 UNIT/ML Solostar Pen Inject 7 Units into the skin daily. 07/27/22   [provider]  loperamide  (IMODIUM ) 2 MG capsule Take 1 capsule (2 mg total) by mouth See admin instructions. Initial: 4 mg, followed by 2 mg after each loose stool, maximum 16 mg within 24 hours. 04/27/21   Babara Call, MD  metoprolol  succinate (TOPROL -XL) 50 MG 24 hr tablet Take 50 mg by mouth daily.    [provider]  omeprazole  (PRILOSEC) 20 MG capsule Take 20 mg by mouth 2 (two) times daily.    [provider]  polyethylene glycol (MIRALAX  / GLYCOLAX ) 17 g packet Take 17 g by mouth daily as needed for mild constipation. 10/02/21   Krishnan, Sendil K, MD  sertraline  (ZOLOFT ) 25 MG tablet Take 50 mg by mouth daily. 11/22/20   [provider]  spironolactone  (ALDACTONE ) 100 MG tablet Take 200 mg by mouth daily.    [provider]  torsemide  (DEMADEX ) 20 MG tablet Take 20 mg by mouth daily as needed.    [provider]  traMADol  (ULTRAM ) 50 MG tablet Take 1 tablet (50 mg total) by mouth every 12 (twelve) hours as needed for moderate pain. 10/02/21   Krishnan, Sendil K, MD    Physical Exam: Vitals:   04/30/23 1145 04/30/23 1315 04/30/23 1330  04/30/23 1335  BP: (!) 148/93 (!) 138/92 (!) 126/90   Pulse: (!) 107 (!) 116 (!) 115   Resp: (!) 23 (!) 27 (!) 27   Temp:    (!) 97.4 F (36.3 C)  TempSrc:    Oral  SpO2: 96% 94% 95%    Physical Exam Vitals reviewed.  Constitutional:      General: She is not in acute distress. HENT:     Head: Normocephalic and atraumatic.  Eyes:     Extraocular Movements: Extraocular movements intact.     Pupils: Pupils are equal, round, and reactive to light.  Neck:     Vascular: JVD present.  Cardiovascular:     Rate and Rhythm: Regular rhythm. Tachycardia present.  Pulmonary:     Effort: Tachypnea and accessory muscle usage present. No respiratory distress.     Breath sounds: Decreased breath sounds (Notably diminished  on the right) and rales (left) present.  Abdominal:     General: There is distension.     Tenderness: There is no abdominal tenderness. There is no guarding.  Musculoskeletal:     Cervical back: Neck supple.     Right lower leg: 1+ Pitting Edema present.     Left lower leg: 1+ Pitting Edema present.  Skin:    General: Skin is warm and dry.  Neurological:     Mental Status: She is alert.     Comments:  Patient is alert and oriented, able to answer questions and follow instructions although sleepy.  Quickly falls back asleep    Data Reviewed: Initial VBG with pH of 7.17 and pCO2 of 62.  Repeat VBG with pH of 7.26 with pCO2 of 48 CBC with WBC of 12.9, hemoglobin of 12.8, platelets of 470 CMP with sodium of 140, potassium 4.5, bicarb 21, glucose 154, BUN 36, creatinine 1.44, alkaline phosphatase 350, albumin 3.0, AST 37, ALT 21, GFR of 37 BNP 2191 Troponin 68 Lactic acid 2.9  EKG personally reviewed.  Sinus rhythm with rate of 141.  QTc prolongation at 549.  Nonspecific T wave changes  DG Chest Port 1 View Result Date: 04/30/2023 CLINICAL DATA:  Shortness of breath bilateral feet swelling EXAM: PORTABLE CHEST 1 VIEW COMPARISON:  07/19/2022 FINDINGS: Mild cardiomegaly.  Diffuse bilateral airspace disease, left greater than right. Suspect small layering effusions. No acute bony abnormality. IMPRESSION: Flow lung volumes with bilateral airspace disease which could reflect asymmetric edema or infection. Suspect layering effusions. Electronically Signed   By: Franky Crease M.D.   On: 04/30/2023 10:11   Results are pending, will review when available.  Assessment and Plan:  * Acute on chronic HFrEF (heart failure with reduced ejection fraction) (HCC) Per chart review, patient has a long-term history of HFpEF and recently established with [facility] cardiology.  Echocardiogram obtained at that time demonstrated significant decompensation in LVEF to 25%.  At recent visit with nephrology, her torsemide  was increased to 100 mg twice daily.  Despite this, patient had sudden onset shortness of breath today with elevated BNP.  Diuresis will be complicated by her CKD and cirrhosis with generalized anasarca.  - Cardiology consulted; appreciate their recommendations - Telemetry monitoring - S/p Lasix  120 mg once. Continue with Lasix  100 mg BID for now - Consider Lasix  infusion - Strict in and out - Daily weights - May ultimately require palliative care consultation given poor prognosis  Acute respiratory failure with hypoxia and hypercapnia (HCC) Patient presenting with severe shortness of breath and hypoxic and hypercarbic respiratory failure suspected to be due to underlying heart disease and anasarca.  Low suspicion for infection due to lack of other symptoms such as fever, rhinorrhea, cough, etc.  - Continue BiPAP as needed - Repeat VBG due to lethargy - CT chest to assess for drainable pleural effusion - Hold off on further antimicrobial therapy - Procalcitonin pending  NSTEMI (non-ST elevated myocardial infarction) (HCC) Suspect demand in the setting of acute hypoxic and hypercarbic respiratory failure.  No reported chest pain.  - Continue to trend  troponin.  Hepatic cirrhosis (HCC) History of decompensated cirrhosis with portal hypertension and ascites secondary to AL Amyloidosis.   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  She  underwent treatment with chemotherapy, which has been held since January 2024.  Chronic kidney disease, stage 3b (HCC) Per chart review, baseline creatinine between 1.2 and 1.5 for the last > 3 months. Currently at baseline.  - Continue to monitor closely while diuresing - Strict in and out - Daily weights - Avoid other nephrotoxic agents as able  Insulin  dependent type 2 diabetes mellitus (HCC) - Hold home regimen - SSI, moderate - Semglee  5 units at bedtime  Essential hypertension Significantly hypertensive on initial arrival with rapid improvement after BiPAP.  - Continue home regimen  Advance Care Planning:   Code Status: Full Code verified by patient and her daughter at bedside.  Consults: Cardiology  Family Communication: Patient's daughter updated at bedside  Severity of Illness: The appropriate patient status for this patient is INPATIENT. Inpatient status is judged to be reasonable and necessary in order to provide the required intensity of service to ensure the patient's safety. The patient's presenting symptoms, physical exam findings, and initial radiographic and laboratory data in the context of their chronic comorbidities is felt to place them at high risk for further clinical deterioration. Furthermore, it is not anticipated that the patient will be medically stable for discharge from the hospital within 2 midnights of admission.   * I certify that at the point of admission it is my clinical judgment that the patient will require  inpatient hospital care spanning beyond 2 midnights from the point of admission due to high intensity of service, high risk for further deterioration and high frequency of surveillance required.*  Author: Clayborne Broom, MD 04/30/2023 2:44 PM  For on call review [email or web address].

## 2023-04-30 NOTE — Consult Note (Signed)
 [facility] CLINIC CARDIOLOGY CONSULT NOTE       Patient ID: Nancy Rodriguez MRN: [id number] DOB/AGE: 1945-06-03 78 y.o.  Admit date: 04/30/2023 Referring Physician Dr. Clayborne Broom  Primary Physician Epifanio Alm SQUIBB, MD  Primary Cardiologist Dr. Wilburn Reason for Consultation AoCHF  HPI: Nancy Rodriguez is a 78 y.o. 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 Cardiology was consulted for further evaluation.   Patient reports that for the last 4 to 5 days she has had worsening shortness of breath despite an increase in her torsemide  dose by her nephrologist.  States that she would become very short of breath with minimal activity in her home.  Given her worsening symptoms she decided to come to the ED for further evaluation.  Workup in the ED notable for creatinine 1.44, potassium 4.5, sodium 140, alk phos 350, albumin 3.0, hemoglobin 12.8, WBC 12.9.  Lactic acid 2.0.  BNP elevated at 2191.  Troponins trended 68 > 210.  VBG with pH of 7.17, pCO2 62, pO2 less than 31.  Chest x-ray with diffuse airspace disease left > right.  CT chest pending read.  She was started on BiPAP in the ED and given IV Lasix  120 mg x 1.  At the time of my evaluation this afternoon she is resting comfortably in ED stretcher with her daughter present at bedside.  She is now on 3 L nasal cannula.  States that overall she feels her breathing, abdominal distention, and lower extremity edema has improved since she was given IV Lasix  earlier today.  We discussed her recent symptoms in further detail.  Endorses associated orthopnea.  She denies any chest pain or tightness.  Also denies any fever, chills, recent cough.  She was recently seen by [facility] heart failure in our clinic who suspected that her newly reduced EF was secondary to amyloid  and did not recommend any further imaging or GDMT at that time.  Currently she appears more comfortable and is diuresing well with IV Lasix , endorses great urine output.  Of note, CODE STATUS and goals of care were discussed with patient and her daughter.  It was explained to her in great detail that this is likely progression of her amyloid and overall prognosis is guarded, and that our main focus will be symptom management at this time.  She expressed understanding.  She did express interest in discussing goals of care with palliative medicine.  Review of systems complete and found to be negative unless listed above    Past Medical History:  Diagnosis Date   (HFpEF) heart failure with preserved ejection fraction (HCC)    a. 05/2020 Echo: EF 60-65%; b. 08/2020 cMRI ([facility]): EF 71%, no delayed hyperenhancement to suggest scar/infiltrative dzs. Nl RV fxn. Mild BAE. Triv MR, mild TR; c. 06/2021 Echo: EF 50-55%, no rwma, mild basal-septal LVH, GrII DD. Nl RV size/fxn. Mild MR. Ao sclerosis.   AL amyloidosis (HCC)    Anemia due to chronic kidney disease    Anemia in chronic kidney disease   CKD (chronic kidney disease), stage III (HCC)    Diabetes mellitus without complication (HCC)    History of cardiovascular stress test    a. 03/2012 Stress Echo([facility]): nl study.   Hypercholesterolemia    Hypertension    MGUS (monoclonal gammopathy of unknown significance)  Osteoarthritis    Rheumatoid arthritis [facility])     Past Surgical History:  Procedure Laterality Date   HERNIA REPAIR     INTRAMEDULLARY (IM) NAIL INTERTROCHANTERIC Right 09/26/2021   Procedure: INTRAMEDULLARY (IM) NAIL INTERTROCHANTRIC;  Surgeon: Marchia Drivers, MD;  Location: [facility] ORS;  Service: Orthopedics;  Laterality: Right;   PARATHYROIDECTOMY      (Not in a hospital admission)  Social History   Socioeconomic History   Marital status: Widowed    Spouse name: Not on file   Number of children: 6   Years of education: Not on  file   Highest education level: Not on file  Occupational History   Not on file  Tobacco Use   Smoking status: Never   Smokeless tobacco: Never  Vaping Use   Vaping status: Never Used  Substance and Sexual Activity   Alcohol use: No   Drug use: Never   Sexual activity: Not on file  Other Topics Concern   Not on file  Social History Narrative   Not on file   Social Drivers of Health   Financial Resource Strain: Low Risk  (03/29/2023)   Received from [facility]   Overall Financial Resource Strain (CARDIA)    Difficulty of Paying Living Expenses: Not hard at all  Food Insecurity: No Food Insecurity (03/29/2023)   Received from [facility]   Hunger Vital Sign    Worried About Running Out of Food in the Last Year: Never true    Ran Out of Food in the Last Year: Never true  Transportation Needs: No Transportation Needs (03/29/2023)   Received from [facility] - Transportation    In the past 12 months, has lack of transportation kept you from medical appointments or from getting medications?: No    Lack of Transportation (Non-Medical): No  Physical Activity: Not on file  Stress: Not on file  Social Connections: Not on file  Intimate Partner Violence: Not on file    Family History  Problem Relation Age of Onset   Diabetes Daughter    Diabetes Son    Dementia Mother    Cancer Father    Esophageal cancer Sister    Brain cancer Brother      Vitals:   04/30/23 1500 04/30/23 1501 04/30/23 1530 04/30/23 1620  BP: (!) 137/91   135/86  Pulse: (!) 116  (!) 112 (!) 109  Resp: (!) 28  (!) 21 (!) 28  Temp:      TempSrc:      SpO2: 96%  96% 98%  Weight:  69.9 kg    Height:  5' 4 (1.626 m)      PHYSICAL EXAM General: Chronically ill appearing elderly female, well nourished, in no acute distress. HEENT: Normocephalic and atraumatic. Neck: No JVD.  Lungs: Normal respiratory effort on 3L [place].  Bibasilar crackles.   Heart: HRRR. Normal S1 and S2 without gallops or murmurs.  Abdomen: Non-distended appearing.  Msk: Normal strength and tone for age. Extremities: Warm and well perfused. No clubbing, cyanosis.  2+ pitting edema.  Neuro: Alert and oriented X 3. Psych: Answers questions appropriately.   Labs: Basic Metabolic Panel: Recent Labs    04/30/23 0814 04/30/23 1532  NA 140  --   K 4.5  --   CL 105  --   CO2 21*  --   GLUCOSE 154*  --   BUN 36*  --   CREATININE 1.44*  --  CALCIUM  9.2  --   MG  --  1.6*   Liver Function Tests: Recent Labs    04/30/23 0814  AST 37  ALT 21  ALKPHOS 350*  BILITOT 1.3*  PROT 7.2  ALBUMIN 3.0*   No results for input(s): LIPASE, AMYLASE in the last 72 hours. CBC: Recent Labs    04/30/23 0814  WBC 12.9*  NEUTROABS 6.7  HGB 12.8  HCT 38.6  MCV 77.5*  PLT 470*   Cardiac Enzymes: Recent Labs    04/30/23 0814 04/30/23 1203 04/30/23 1532  TROPONINIHS 68* 210* 211*   BNP: Recent Labs    04/30/23 0814  BNP 2,191.0*   D-Dimer: No results for input(s): DDIMER in the last 72 hours. Hemoglobin A1C: No results for input(s): HGBA1C in the last 72 hours. Fasting Lipid Panel: No results for input(s): CHOL, HDL, LDLCALC, TRIG, CHOLHDL, LDLDIRECT in the last 72 hours. Thyroid  Function Tests: No results for input(s): TSH, T4TOTAL, T3FREE, THYROIDAB in the last 72 hours.  Invalid input(s): FREET3 Anemia Panel: No results for input(s): VITAMINB12, FOLATE, FERRITIN, TIBC, IRON , RETICCTPCT in the last 72 hours.   Radiology: CT CHEST WO CONTRAST Result Date: 04/30/2023 CLINICAL DATA:  Pleural effusion, known or suspected (Ped 0-17y). Shortness of breath. EXAM: CT CHEST WITHOUT CONTRAST TECHNIQUE: Multidetector CT imaging of the chest was performed following the standard protocol without IV contrast. RADIATION DOSE REDUCTION: This exam was performed according to the departmental dose-optimization program  which includes automated exposure control, adjustment of the mA and/or kV according to patient size and/or use of iterative reconstruction technique. COMPARISON:  None Available. FINDINGS: Cardiovascular: Normal cardiac size. No pericardial effusion. No aortic aneurysm. There are coronary artery calcifications, in keeping with coronary artery disease. There are also mild-to-moderate peripheral atherosclerotic vascular calcifications of thoracic aorta and its major branches. There is dilation of the main pulmonary trunk measuring up to 2.9 cm, which is nonspecific but can be seen with pulmonary artery hypertension. Mediastinum/Nodes: Visualized thyroid  gland appears grossly unremarkable. No solid / cystic mediastinal masses. The esophagus is nondistended precluding optimal assessment. No mediastinal or axillary lymphadenopathy by size criteria. Evaluation of bilateral hila is limited due to lack on intravenous contrast: however, no large hilar lymphadenopathy identified. Lungs/Pleura: The central tracheo-bronchial tree is patent. There are heterogeneous opacities throughout bilateral lungs, compatible with multilobar pneumonia. There also bilateral small pleural effusions. There superimposed atelectatic changes in the left lower lobe as well. No pneumothorax or suspicious lung mass. Upper Abdomen: There is a subcapsular 1.3 x 1.8 cm hypoattenuating structure in the left hepatic dome, not well evaluated on the current exam but characterized as a cyst on the prior exam. There is at least moderate ascites. There is a small sliding hiatal hernia. Remaining visualized upper abdominal viscera within normal limits. Musculoskeletal: The visualized soft tissues of the chest wall are grossly unremarkable. No suspicious osseous lesions. There are mild multilevel degenerative changes in the visualized spine. IMPRESSION: 1. Bilateral multilobar pneumonia and bilateral small pleural effusions. Follow-up to clearing is recommended.  2. Multiple other nonacute observations, as described above. Aortic Atherosclerosis (ICD10-I70.0). Electronically Signed   By: Ree Molt M.D.   On: 04/30/2023 15:39   DG Chest Port 1 View Result Date: 04/30/2023 CLINICAL DATA:  Shortness of breath bilateral feet swelling EXAM: PORTABLE CHEST 1 VIEW COMPARISON:  07/19/2022 FINDINGS: Mild cardiomegaly. Diffuse bilateral airspace disease, left greater than right. Suspect small layering effusions. No acute bony abnormality. IMPRESSION: Flow lung volumes with bilateral airspace disease which  could reflect asymmetric edema or infection. Suspect layering effusions. Electronically Signed   By: Franky Crease M.D.   On: 04/30/2023 10:11    ECHO 03/18/2023: SEVERE LEFT VENTRICULAR SYSTOLIC DYSFUNCTION WITH SEVERE LVH  ESTIMATED EF: 25%, CALC EF(2D): 26%  NORMAL LA PRESSURES WITH DIASTOLIC DYSFUNCTION (GRADE 1)  NORMAL RIGHT VENTRICULAR SYSTOLIC FUNCTION  VALVULAR REGURGITATION: TRIVIAL AR, MILD MR, MILD PR, MILD TR  VALVULAR STENOSIS: MILD AS, No MS, No PS, No TS   TELEMETRY reviewed by me 04/30/2023: Sinus tachycardia with first-degree AVB rate 100s  EKG reviewed by me: Sinus tachycardia rate 141 bpm  Data reviewed by me 04/30/2023: last 24h vitals tele labs imaging I/O ED provider note, admission H&P  Principal Problem:   Acute on chronic HFrEF (heart failure with reduced ejection fraction) (HCC) Active Problems:   Amyloidosis (HCC)   Essential hypertension   Hepatic cirrhosis (HCC)   Chronic kidney disease, stage 3b (HCC)   Insulin  dependent type 2 diabetes mellitus (HCC)   Acute respiratory failure with hypoxia and hypercapnia (HCC)   NSTEMI (non-ST elevated myocardial infarction) (HCC)    ASSESSMENT AND PLAN:  Nancy Rodriguez is a 78 y.o. 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 Cardiology was consulted for further evaluation.   # Acute HFrEF # Advanced AL lambda light chain amyloidosis # Demand ischemia Patient with history of advanced AL amyloidosis s/p chemotherapy which has been held since January 2024 presenting with worsening shortness of breath and lower extremity edema.  She recently underwent an echocardiogram outpatient at our clinic 03/2023 which revealed newly reduced EF at 25%.  Since this time her p.o. diuretic dose has been increased outpatient but she has had persistent symptoms.  BNP elevated at 2191.  Troponins trended 68 > 210 > 211.  EKG demonstrated sinus tachycardia. -No plan for any further cardiac imaging or workup at this time. -Agree with IV Lasix  100 mg twice daily. -Overall plan will be for symptom management with IV diuresis. -Troponin elevation most consistent with demand/supply mismatch and not ACS in the setting of acute HFrEF. -Patient interested in discussing goals of care with palliative medicine.   This patient's plan of care was discussed and created with Dr. Wilburn and he is in agreement.  Signed: Danita Bloch, PA-C  04/30/2023, 4:49 PM [facility]

## 2023-04-30 NOTE — Assessment & Plan Note (Addendum)
 On admission, Per chart review, baseline creatinine between 1.2 and 1.5 for the last > 3 months. Currently at baseline. Continue to monitor closely while diuresing. Strict in and out.  Daily weights.  Avoid other nephrotoxic agents as able 05-01-2023 monitor Scr. SCR today 1.64 05-02-2023 scr stable at 1.69 today. BUN 44. Cards has change diuretics over to po demadex  40 mg bid.  05-03-2023 her CKD stage 3b is a contraindication for ARB/ACEI at discharge. Discharge Scr 1.83.cardiology satisfied with Scr at discharge.

## 2023-04-30 NOTE — Assessment & Plan Note (Addendum)
 On Admission, Per chart review, patient has a long-term history of HFpEF and recently established with [facility] cardiology.  Echocardiogram obtained at that time demonstrated significant decompensation in LVEF to 25%.  At recent visit with nephrology, her torsemide  was increased to 100 mg twice daily.  Despite this, patient had sudden onset shortness of breath today with elevated BNP.  Diuresis will be complicated by her CKD and cirrhosis with generalized anasarca. Cardiology consulted. Palliative care consulted. Pt placed on lasix  100 mg IV q12. 05-01-2023 remains on lasix  100 mg IV q12h. Pt not a candidate for any further treatment for her amyloidosis. Awaiting palliative care consult for GOC/code status discussion. Remains on aldactone  100 mg daily and Toprol -XL 50 mg daily.  05-02-2023 cards has seen patient and stopped IV lasix . Changed to po demadex  40 mg bid. Remains on aldactone  100 mg daily, troprol-xl 50 mg daily. Weight is down 154 lbs on admission. Currently 141 lbs.  Holding losartan at discharge due to CKD stage 3b. F/u with cards and nephrology next week. Discharge BNP 1290.  05-03-2023 weight down to 136.6 lbs. Cards will continue demadex  40 mg bid, aldactone  100 mg qday, toprol -xl 50 mg daily.

## 2023-04-30 NOTE — Sepsis Progress Note (Signed)
 Elink will follow per sepsis protocol.

## 2023-04-30 NOTE — Assessment & Plan Note (Addendum)
 On admission, pt with History of decompensated cirrhosis with portal hypertension and ascites secondary to AL Amyloidosis.  05-01-2023 bedside abd U/S shows moderate abd ascites. Pt's abd is not tense. Currently no indication for paracentesis. Continue with lasix  and aldactone .  05-02-2023 due to amyloid liver. No indication for paracentesis.

## 2023-04-30 NOTE — ED Notes (Signed)
 Lab contacted to draw lactic and 2nd blood cultures

## 2023-04-30 NOTE — Assessment & Plan Note (Addendum)
On admission, Hold home regimen. Placed on SSI, moderate.  Semglee 5 units at bedtime 05-01-2023 CBG range is acceptable. Continue with lantus 5 every day and SSI.  05-02-2023 CBG is acceptable ranges.

## 2023-04-30 NOTE — ED Notes (Signed)
 Hospitalist at bedside

## 2023-04-30 NOTE — Consult Note (Addendum)
 PHARMACY CONSULT NOTE - FOLLOW UP  Pharmacy Consult for Electrolyte Monitoring and Replacement   Recent Labs: Potassium (mmol/L)  Date Value  04/30/2023 4.5   Magnesium  (mg/dL)  Date Value  98/85/7974 1.6 (L)   Calcium  (mg/dL)  Date Value  98/85/7974 9.2   Albumin (g/dL)  Date Value  98/85/7974 3.0 (L)   Sodium (mmol/L)  Date Value  04/30/2023 140   Diet: Carb/Healthy Fluids: NA Pertinent Medications Furosemide  100 mg BID  Assessment: Fraser is a 78 yo female who presented with worsening shortness of breath and tachypnea over the last few days. They are admitted for acute HFrEF. 03/2023 EF 25%. BNP 2191, Troponin elevated. Scr 1.44 from a baseline around 0.98. Pharmacy has been consulted to manage this patient's electrolytes.   Goal of Therapy:  Electrolytes WNL 04/30/2023 K = 4.5 Mg = 1.6 Phos = NA  Plan:  Replaced with 2g of Magnesium  sulfate IV x 1 No other replaced required at this time Re-check BMP, Mg, with AM labs  Thank you for allowing pharmacy to participate in this patient's care.  Alfonso MARLA Buys, PharmD Pharmacy Resident  04/30/2023 5:59 PM

## 2023-04-30 NOTE — ED Provider Notes (Signed)
 [facility] Provider Note    Event Date/Time   First MD Initiated Contact with Patient 04/30/23 [phone]     (approximate)   History   Shortness of Breath   HPI  Nancy Rodriguez is a 78 year old female with history of HTN, DM, CHF with EF of 30%, amyloidosis presenting to the emergency department for evaluation of shortness of breath.  History primarily obtained from patient's daughter at bedside.  She reports that patient has had some ongoing issues with leg swelling, but this morning patient woke up acutely short of breath which is new for her.  She was also complaining of left arm pain.  Patient had access to her medications, but she is unsure if she has been skipping some doses of torsemide  to avoid going to the bathroom as frequently.  She does report that her blood pressure is usually well-controlled, has not taking her morning meds, but otherwise has been compliant with her regimen, though she did have recent medication changes in December.  I reviewed her cardiology note from 04/02/2023.  At that time, she was noted to have recent echo demonstrating reduced EF to 30%.  She expressed that she was not interested in aggressive care, with plans for continued medication management as needed.   Additionally reviewed her nephrology visit from 04/03/2023.  At that time she was noted to be fluid overload and her torsemide  was increased to 100 mg daily.  There was a discussion regarding benefit of IV diuretics, but patient was refusing at that time.     Physical Exam   Triage Vital Signs: ED Triage Vitals  Encounter Vitals Group     BP --      Systolic BP Percentile --      Diastolic BP Percentile --      Pulse --      Resp 04/30/23 0759 (!) 30     Temp --      Temp src --      SpO2 --      Weight --      Height --      Head Circumference --      Peak Flow --      Pain Score 04/30/23 0756 0     Pain Loc --      Pain Education --      Exclude from  Growth Chart --     Most recent vital signs: Vitals:   04/30/23 1130 04/30/23 1145  BP: (!) 144/95 (!) 148/93  Pulse: (!) 110 (!) 107  Resp: (!) 26 (!) 23  SpO2: 95% 96%     General: Awake, interactive, appears uncomfortable CV:  Tachycardic with heart rates in the 150s, bilateral lower extremity pitting edema Resp:  Labored respirations, satting in the low to mid 90s on nonrebreather mask with increased work of breathing, coarse lung sounds with diffuse crackles bilaterally Abd:  Fullness of abdomen without tenderness Neuro:  No gross facial asymmetry, moving extremity spontaneously   ED Results / Procedures / Treatments   Labs (all labs ordered are listed, but only abnormal results are displayed) Labs Reviewed  LACTIC ACID, PLASMA - Abnormal; Notable for the following components:      Result Value   Lactic Acid, Venous 2.0 (*)    All other components within normal limits  COMPREHENSIVE METABOLIC PANEL - Abnormal; Notable for the following components:   CO2 21 (*)    Glucose, Bld 154 (*)    BUN 36 (*)  Creatinine, Ser 1.44 (*)    Albumin 3.0 (*)    Alkaline Phosphatase 350 (*)    Total Bilirubin 1.3 (*)    GFR, Estimated 37 (*)    All other components within normal limits  CBC WITH DIFFERENTIAL/PLATELET - Abnormal; Notable for the following components:   WBC 12.9 (*)    MCV 77.5 (*)    MCH 25.7 (*)    RDW 20.8 (*)    Platelets 470 (*)    Lymphs Abs 5.0 (*)    All other components within normal limits  BRAIN NATRIURETIC PEPTIDE - Abnormal; Notable for the following components:   B Natriuretic Peptide 2,191.0 (*)    All other components within normal limits  BLOOD GAS, VENOUS - Abnormal; Notable for the following components:   pH, Ven 7.17 (*)    pCO2, Ven 62 (*)    pO2, Ven <31 (*)    Acid-base deficit 6.8 (*)    All other components within normal limits  BLOOD GAS, VENOUS - Abnormal; Notable for the following components:   pO2, Ven 59 (*)    Acid-base  deficit 5.7 (*)    All other components within normal limits  TROPONIN I (HIGH SENSITIVITY) - Abnormal; Notable for the following components:   Troponin I (High Sensitivity) 68 (*)    All other components within normal limits  CULTURE, BLOOD (ROUTINE X 2)  CULTURE, BLOOD (ROUTINE X 2)  LACTIC ACID, PLASMA  URINALYSIS, W/ REFLEX TO CULTURE (INFECTION SUSPECTED)  TROPONIN I (HIGH SENSITIVITY)     EKG EKG independently reviewed interpreted by myself (ER attending) demonstrates:  EKG demonstrate sinus tachycardia rate of 141, PR 164, QRS 87, QTc 549, no acute ST changes  RADIOLOGY Imaging independently reviewed and interpreted by myself demonstrates:  CXR with bilateral airspace disease concerning for edema versus infection  PROCEDURES:  Critical Care performed: Yes, see critical care procedure note(s)  CRITICAL CARE Performed by: Nilsa Dade   Total critical care time: 40 minutes  Critical care time was exclusive of separately billable procedures and treating other patients.  Critical care was necessary to treat or prevent imminent or life-threatening deterioration.  Critical care was time spent personally by me on the following activities: development of treatment plan with patient and/or surrogate as well as nursing, discussions with consultants, evaluation of patient's response to treatment, examination of patient, obtaining history from patient or surrogate, ordering and performing treatments and interventions, ordering and review of laboratory studies, ordering and review of radiographic studies, pulse oximetry and re-evaluation of patient's condition.   Procedures   MEDICATIONS ORDERED IN ED: Medications  azithromycin  (ZITHROMAX ) 500 mg in sodium chloride  0.9 % 250 mL IVPB (has no administration in time range)  furosemide  (LASIX ) 120 mg in dextrose  5 % 50 mL IVPB (0 mg Intravenous Stopped 04/30/23 1137)  cefTRIAXone  (ROCEPHIN ) 2 g in sodium chloride  0.9 % 100 mL IVPB (0 g  Intravenous Stopped 04/30/23 1243)     IMPRESSION / MDM / ASSESSMENT AND PLAN / ED COURSE  I reviewed the triage vital signs and the nursing notes.  Differential diagnosis includes, but is not limited to, acute CHF exacerbation, hypertensive emergency with flash pulmonary edema, ACS, pneumonia, pneumothorax  Patient's presentation is most consistent with acute presentation with potential threat to life or bodily function.  78 year old female presenting to the emergency department with shortness of breath found to be tachypneic, tachycardic, hypertensive on presentation.  My initial evaluation, BP 210s over 120s.  Hypoxic with EMS, arrives  on nonrebreather with persistent labored respirations.  Crackles on exam with clinical signs of volume overload.  With rapid onset, I am concerned about flash pulmonary edema and have ordered BiPAP, Lasix , nitroglycerin  drip.    Labs with leukocytosis WBC of 12.9, AKI with creatinine of 1.44.  Troponin slightly elevated at 68, suspect likely demand but will obtain repeat.  BNP significantly elevated at 2000.  Lactate slightly elevated at 2.  Initial VBG with significant acidosis with pH of 7.17, pCO2 of 62, after an hour on BiPAP, significantly improved with pH of 7.26, pCO2 48.  Patient had significant improvement in her blood pressure without nitroglycerin  drip, so this was discontinued. X-Teofilo Lupinacci demonstrated bilateral airspace concerning for edema versus infection.  While I do suspect edema, with white count, will go ahead and initiate sepsis orders in the setting of her tachycardia, tachypnea.  With her improving blood gas, do think she is stable for admission.  Will reach out to hospitalist team.  Case reviewed with Dr. Arnett.  She will evaluate the patient for anticipated admission.     FINAL CLINICAL IMPRESSION(S) / ED DIAGNOSES   Final diagnoses:  Acute respiratory failure with hypoxia and hypercapnia (HCC)  Acute on chronic congestive heart failure,  unspecified heart failure type (HCC)     Rx / DC Orders   ED Discharge Orders     None        Note:  This document was prepared using Dragon voice recognition software and may include unintentional dictation errors.   Levander Slate, MD 04/30/23 1245

## 2023-04-30 NOTE — Progress Notes (Signed)
 Heart Failure Stewardship Pharmacy Note  PCP: Epifanio Alm SQUIBB, MD PCP-Cardiologist: Lonni Hanson, MD  HPI: Nancy Rodriguez is a 78 y.o. female with  AL Amyloidosis s/p chemotherapy, complicated by cirrhosis and CKD Stage 3b, HFrEF, type 2 diabetes, hypertension, hyperlipidemia  who presented with progressive worsening shortness of breath/tachypnea over several days and acute worsening 04/30/23 prompting presentation to the ED. Acute onset shortness of breath was accompanied by marked hypertension (186/90 mmHg). CT scan on admission showed bialteral multilobular pneumonia and bilateral small pleural effusions. BNP on admission was elevated at 2191. HS-troponin on admission was 68, trending to 210. Patient and daughter report excellent functional status ~2 weeks ago, able to cook, clean, and perform ADLs.  Patient has a history of MGUS since 2009 and was followed regularly. In December 2021 patient was noted to have elevated AST, ALT and ALK and underwent ultrasound guided liver biopsy on 05/11/2020, which showed diffuse amyloidosis. A bone marrow biopsy on 06/09/20 showed 8% monoclonal plasma cells. Patient was started on Dara-CyBord. On 11/01/20 her chemotherapy was changed to Dara-Rev-Dex. She tolerated the chemotherapy quite well. Chemotherapy held in 2023. In 04/2022 was started back on dara chemotherapy. She indeed received weekly dara treatment for a few weeks, but then the treatment was held due to the need of weekly treatment and patient not feeling well. Patient is not currently on chemotherapy.   Pertinent cardiac history: Cardiac MRI in 2022 showed LVEF of 71% with upper septal thickening. Echo 06/2021 showed low normal systolic function of 50 to 55%. Echo 03/2023 showed LVEF of 25% with grade I diastolic dysfunction. Patient seen by Dr. Tobie with [facility] heart failure who noted that there is likely little benefit of MRI or aggressive GDMT titration.  Pertinent Lab Values: Creatinine  Date  Value Ref Range Status  12/28/2022 0.98 0.44 - 1.00 mg/dL Final   Creatinine, Ser  Date Value Ref Range Status  04/30/2023 1.44 (H) 0.44 - 1.00 mg/dL Final   BUN  Date Value Ref Range Status  04/30/2023 36 (H) 8 - 23 mg/dL Final   Potassium  Date Value Ref Range Status  04/30/2023 4.5 3.5 - 5.1 mmol/L Final   Sodium  Date Value Ref Range Status  04/30/2023 140 135 - 145 mmol/L Final   B Natriuretic Peptide  Date Value Ref Range Status  04/30/2023 2,191.0 (H) 0.0 - 100.0 pg/mL Final    Comment:    Performed at [facility], [address] [place], [place] [address]   Hgb A1c MFr Bld  Date Value Ref Range Status  09/27/2021 6.4 (H) 4.8 - 5.6 % Final    Comment:    (NOTE) Pre diabetes:          5.7%-6.4%  Diabetes:              >6.4%  Glycemic control for   <7.0% adults with diabetes    TSH  Date Value Ref Range Status  05/20/2020 6.489 (H) 0.350 - 4.500 uIU/mL Final    Comment:    Performed by a 3rd Generation assay with a functional sensitivity of <=0.01 uIU/mL. Performed at [facility], [address] [place], [place] [address]    LDH  Date Value Ref Range Status  07/19/2022 127 98 - 192 U/L Final    Comment:    Performed at [facility], [address] [place], [place] [address]    Vital Signs:  Temp:  [97.4 F (36.3 C)] 97.4 F (36.3 C) (01/14  1335) Pulse Rate:  [106-140] 115 (01/14 1330) Resp:  [23-37] 27 (01/14 1330) BP: (123-186)/(69-95) 126/90 (01/14 1330) SpO2:  [91 %-96 %] 95 % (01/14 1330) FiO2 (%):  [50 %-60 %] 50 % (01/14 0935)  Intake/Output Summary (Last 24 hours) at 04/30/2023 1450 Last data filed at 04/30/2023 1303 Gross per 24 hour  Intake --  Output 300 ml  Net -300 ml    Current Heart Failure Medications:  Loop diuretic: furosemide  100 mg IV BID Beta-Blocker: none ACEI/ARB/ARNI: none MRA: spironolactone  200 mg daily SGLT2i: none Other: none  Prior to admission Heart Failure Medications:   Loop diuretic: torsemide  40 mg daily (self-decreased from 100 mg BID) Beta-Blocker: none ACEI/ARB/ARNI: none (allergy) MRA: spironolactone  200 mg daily SGLT2i: none Other:hydralazine  25 mg TID, amlodipine  5 mg daily  Assessment: 1. 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 NYHA class IV symptoms.  -Symptoms: Reports improvement in shortness of breath since the first dose of furosemide . Continues to feel fatigued. Lower extremities are colder than normal per patient report. Appetite has been good. -Volume: Patient is hypervolemic on exam. JVP is elevated and pitting LEE is still present, but appears a considerable amount has been removed already. -Hemodynamics: BP remains elevated. Tachycardic on telemetry in room. -BB: None. Given sinus tachycardia, likely relies on HR to maintain adequate output at this time. Not ideal if autonomic dysfunction due to AL amyloid is suspected. -ACEI/ARB/ARNI: Contraindicated due to history of anaphylaxis to lisinopril. Given age, would not rechallenge with ARB. -MRA: Patient is on high dose spironolactone  at home. Can consider resuming at reduced dose pending renal function. -SGLT2i: Patient may be able to start SGLT2i after external catheter is removed. Unsure the degree of benefit given age. No history of UTI. -Appears patient has never been diagnosed with cardiac involvement of AL amyloidosis. -Recommend stopping amlodipine  at discharge as it can worsen LEE and has no CV benefit for CHF patients. Can instead consolidate to BiDil.  Plan: 1) Medication changes recommended at this time: -Consider resuming hydralazine  25 mg TID along with isosorbide. Given high incidence of autonomic dysfunction, will need to monitor closely.  2) Patient assistance: -Pending  3) Education: - Patient has been educated on current HF medications and potential additions to HF medication  regimen - Patient verbalizes understanding that over the next few months, these medication doses may change and more medications may be added to optimize HF regimen - Patient has been educated on basic disease state pathophysiology and goals of therapy  Medication Assistance / Insurance Benefits Check: Does the patient have prescription insurance?    Type of insurance plan:  Does the patient qualify for medication assistance through manufacturers or grants? Pending  Outpatient Pharmacy: Prior to admission outpatient pharmacy: [facility]     Please do not hesitate to reach out with questions or concerns,  Jaun Bash, PharmD, CPP, BCPS Heart Failure Pharmacist  Phone - [phone] 04/30/2023 4:25 PM

## 2023-04-30 NOTE — ED Notes (Signed)
 Pt noted to be having a full conversation w/ Pharmacist.

## 2023-04-30 NOTE — ED Triage Notes (Signed)
 EMS called out to patient's residence for increased SOB and bilateral feet swelling.   BP 208/127 O2 89% on Room Air O2 94% on 15L NRB

## 2023-04-30 NOTE — Assessment & Plan Note (Addendum)
 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  She underwent treatment with chemotherapy, which has been held since January 2024. 05-01-2023 per cards, nothing more can be done for her cardiac amyloidosis. Awaiting palliative care consult for GOC/code status/hospice discussion. 05-02-2023 pt met with palliative care team yesterday. No decisions have been made yet.  05-03-2023 met with pt and dtr Brad at bedside. Reviewed that pt's amyloidosis has effected her heart, liver and likely kidneys. That there is really no treatment options for  her. That her disease will progress and that she is going to get sicker and sicker as time goes on.  Unclear how much patient and dtr understand this disease process vs denial.  Palliative care unable to get pt to commit to naming a HCPOA or discussing/changing code status. Maybe her oncologist at [facility] can make some headway in this discussion. Pt and dtr are not understanding my explanation or denying that pt is going to get worse.

## 2023-04-30 NOTE — Assessment & Plan Note (Addendum)
 On admission, Significantly hypertensive on initial arrival with rapid improvement after BiPAP. Continue home regimen 05-01-2023 continue with aldactone  100 mg qday, lasix  100 mg IV bid, toprol -xl 50 mg qday. ARB on hold due to aggressive diuresis plan. 05-02-2023 remains on aldactone  100 mg qday, toprol -xl 50 mg qday. Cards changed diuretics over to demadex  40 mg bid.  ARB on hold due to aggressive diuresis plan.  05-03-2023 discharge to home on aldactone  100 mg day, toprol -xl 50 mg qday, demadex  40 mg bid. Hold losartan at discharge due to CKD stage 3b per my discussion with cardiology.

## 2023-04-30 NOTE — Assessment & Plan Note (Addendum)
 On admission, Suspect demand in the setting of acute hypoxic and hypercarbic respiratory failure.  No reported chest pain. Cardiology agrees that this is not ACS but demand ischemia from her CHF.  05-01-2023 stable. No chest pain.

## 2023-04-30 NOTE — Progress Notes (Signed)
 Heart Failure Navigator Progress Note  Assessed for Heart & Vascular TOC clinic readiness.  Patient does not meet criteria due to [facility] patient of Dr. Corky Sing.   Navigator will sign off at this time.  Roxy Horseman, RN, BSN [facility] Heart Failure Navigator Secure Chat Only

## 2023-05-01 DIAGNOSIS — J9601 Acute respiratory failure with hypoxia: Secondary | ICD-10-CM | POA: Diagnosis not present

## 2023-05-01 DIAGNOSIS — K77 Liver disorders in diseases classified elsewhere: Secondary | ICD-10-CM

## 2023-05-01 DIAGNOSIS — E854 Organ-limited amyloidosis: Secondary | ICD-10-CM

## 2023-05-01 DIAGNOSIS — Z515 Encounter for palliative care: Secondary | ICD-10-CM

## 2023-05-01 DIAGNOSIS — I214 Non-ST elevation (NSTEMI) myocardial infarction: Secondary | ICD-10-CM

## 2023-05-01 DIAGNOSIS — I5023 Acute on chronic systolic (congestive) heart failure: Secondary | ICD-10-CM | POA: Diagnosis not present

## 2023-05-01 DIAGNOSIS — E8581 Light chain (AL) amyloidosis: Secondary | ICD-10-CM | POA: Diagnosis not present

## 2023-05-01 LAB — BASIC METABOLIC PANEL
Anion gap: 11 (ref 5–15)
BUN: 42 mg/dL — ABNORMAL HIGH (ref 8–23)
CO2: 21 mmol/L — ABNORMAL LOW (ref 22–32)
Calcium: 8.4 mg/dL — ABNORMAL LOW (ref 8.9–10.3)
Chloride: 108 mmol/L (ref 98–111)
Creatinine, Ser: 1.64 mg/dL — ABNORMAL HIGH (ref 0.44–1.00)
GFR, Estimated: 32 mL/min — ABNORMAL LOW (ref >60)
Glucose, Bld: 119 mg/dL — ABNORMAL HIGH (ref 70–99)
Potassium: 4.2 mmol/L (ref 3.5–5.1)
Sodium: 140 mmol/L (ref 135–145)

## 2023-05-01 LAB — CBC WITH DIFFERENTIAL/PLATELET
Abs Immature Granulocytes: 0.04 10*3/uL (ref 0.00–0.07)
Basophils Absolute: 0.1 10*3/uL (ref 0.0–0.1)
Basophils Relative: 1 %
Eosinophils Absolute: 0.1 10*3/uL (ref 0.0–0.5)
Eosinophils Relative: 1 %
HCT: 29.1 % — ABNORMAL LOW (ref 36.0–46.0)
Hemoglobin: 9.9 g/dL — ABNORMAL LOW (ref 12.0–15.0)
Immature Granulocytes: 0 %
Lymphocytes Relative: 20 %
Lymphs Abs: 1.8 10*3/uL (ref 0.7–4.0)
MCH: 26.2 pg (ref 26.0–34.0)
MCHC: 34 g/dL (ref 30.0–36.0)
MCV: 77 fL — ABNORMAL LOW (ref 80.0–100.0)
Monocytes Absolute: 0.8 10*3/uL (ref 0.1–1.0)
Monocytes Relative: 9 %
Neutro Abs: 6.6 10*3/uL (ref 1.7–7.7)
Neutrophils Relative %: 69 %
Platelets: 338 10*3/uL (ref 150–400)
RBC: 3.78 MIL/uL — ABNORMAL LOW (ref 3.87–5.11)
RDW: 19.7 % — ABNORMAL HIGH (ref 11.5–15.5)
WBC: 9.4 10*3/uL (ref 4.0–10.5)
nRBC: 0 % (ref 0.0–0.2)

## 2023-05-01 LAB — PHOSPHORUS: Phosphorus: 4.3 mg/dL (ref 2.5–4.6)

## 2023-05-01 LAB — GLUCOSE, CAPILLARY
Glucose-Capillary: 197 mg/dL — ABNORMAL HIGH (ref 70–99)
Glucose-Capillary: 206 mg/dL — ABNORMAL HIGH (ref 70–99)
Glucose-Capillary: 149 mg/dL — ABNORMAL HIGH (ref 70–99)

## 2023-05-01 LAB — MAGNESIUM: Magnesium: 2.3 mg/dL (ref 1.7–2.4)

## 2023-05-01 LAB — PROCALCITONIN: Procalcitonin: 5.37 ng/mL

## 2023-05-01 MED ORDER — FUROSEMIDE 10 MG/ML IJ SOLN
100.0000 mg | Freq: Two times a day (BID) | INTRAVENOUS | Status: AC
  Administered 2023-05-01: 100 mg via INTRAVENOUS
  Filled 2023-05-01: qty 10

## 2023-05-01 MED ORDER — SPIRONOLACTONE 25 MG PO TABS
100.0000 mg | ORAL_TABLET | Freq: Every day | ORAL | Status: DC
  Administered 2023-05-01 – 2023-05-03 (×3): 100 mg via ORAL
  Filled 2023-05-01 (×3): qty 4

## 2023-05-01 NOTE — Progress Notes (Addendum)
PROGRESS NOTE    Nancy Rodriguez  ZOX:[id number] DOB: Jun 28, 1945 DOA: 04/30/2023 PCP: Nancy Sell, MD  Subjective: Pt seen and examined. Her biggest complaint in that clock on her wall in ER is not working. Denies any SOB  Bedside abd U/S shows moderate ascites.  Cards consult reviewed. Nothing more they can do for her amyloidosis and worsening LVEF. Palliative care consulted.   Hospital Course: HPI: Nancy Rodriguez is a 78 y.o. 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   History obtained from patient's daughter at bedside due to patient's sleepiness.  She states that patient has been experiencing bilateral lower extremity swelling that has been difficult to control for the last several weeks, however since her torsemide was increased to 100 mg twice daily, symptoms improved before plateauing.  Then today, she developed sudden onset shortness of breath.  Nancy Rodriguez did not mention any chest pain or palpitations.  No recent illness, fever, chills, nausea, vomiting, diarrhea.  Significant Events: Admitted 04/30/2023 acute on chronic systolic CHF exacerbation   Significant Labs: VBG with pH of 7.17 and pCO2 of 62  WBC of 12.9, platelets of 470, bicarb 21, glucose 154, BUN 36, creatinine 1.44, alkaline phosphatase 350, and GFR of 37. BNP 2191 with troponin of 68. Lactic acid 2.0.   Significant Imaging Studies: Chest x-ray was obtained that demonstrated bilateral airspace disease with suspected layering effusions   Antibiotic Therapy: Anti-infectives (From admission, onward)    Start     Dose/Rate Route Frequency Ordered Stop   04/30/23 2200  acyclovir (ZOVIRAX) 200 MG capsule 400 mg        400 mg Oral 2 times daily 04/30/23 2159     04/30/23 1145  cefTRIAXone  (ROCEPHIN) 2 g in sodium chloride 0.9 % 100 mL IVPB        2 g 200 mL/hr over 30 Minutes Intravenous Once 04/30/23 1132 04/30/23 1243   04/30/23 1145  azithromycin (ZITHROMAX) 500 mg in sodium chloride 0.9 % 250 mL IVPB        500 mg 250 mL/hr over 60 Minutes Intravenous  Once 04/30/23 1132 04/30/23 1347       Procedures:   Consultants: Cardiology Palliative care    Assessment and Plan: * Acute on chronic HFrEF (heart failure with reduced ejection fraction) (HCC) On Admission, Per chart review, patient has a long-term history of HFpEF and recently established with [facility] cardiology.  Echocardiogram obtained at that time demonstrated significant decompensation in LVEF to 25%.  At recent visit with nephrology, her torsemide was increased to 100 mg twice daily.  Despite this, patient had sudden onset shortness of breath today with elevated BNP.  Diuresis will be complicated by her CKD and cirrhosis with generalized anasarca. Cardiology consulted. Palliative care consulted. Pt placed on lasix 100 mg IV q12.  05-01-2023 remains on lasix 100 mg IV q12h. Pt not a candidate for any further treatment for her amyloidosis. Awaiting palliative care consult for GOC/code status discussion. Remains on aldactone 100 mg daily and Toprol-XL 50 mg daily.   Acute respiratory failure with hypoxia and hypercapnia (HCC) On admission, Patient presenting with severe shortness of breath and hypoxic and hypercarbic respiratory failure suspected to be due to underlying heart disease and anasarca.  Low suspicion for infection due to lack of other symptoms  such as fever, rhinorrhea, cough, etc. Continue BiPAP as needed. CT chest shows small bilateral pleural effusions. Was started on IV abx.   05-01-2023 my index of suspicion for pneumonia is low. Procal never done yesterday. Will order one now. If procal negative, will stop IV abx. Effusion look too small for thoracentesis.   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HCC) 05-01-2023  likely the cause of her ascites. No indication for paracentesis.  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  She underwent treatment with chemotherapy, which has been held since January 2024.  05-01-2023 per cards, nothing more can be done for her cardiac amyloidosis. Awaiting palliative care consult for GOC/code status/hospice discussion.  Demand ischemia (HCC) On admission, Suspect demand in the setting of acute hypoxic and hypercarbic respiratory failure.  No reported chest pain. Cardiology agrees that this is not ACS but demand ischemia from her CHF.  05-01-2023 stable. No chest pain.  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   05-01-2023 bedside abd U/S shows moderate abd ascites. Pt's abd is not tense. Currently no indication for paracentesis. Continue with lasix and aldactone.  Chronic kidney disease, stage 3b (HCC) - baseline Scr 1.2-1.5 On admission, Per chart review, baseline creatinine between 1.2 and 1.5 for the last > 3 months. Currently at baseline. Continue to monitor closely while diuresing. Strict in and out.  Daily weights.  Avoid other nephrotoxic agents as able  05-01-2023 monitor Scr. SCR today 1.64  Insulin dependent type 2 diabetes mellitus (HCC) On admission, Hold home regimen. Placed on SSI, moderate.  Semglee 5 units at bedtime  05-01-2023 CBG range is acceptable. Continue with lantus 5 every day and SSI.  Essential hypertension On admission, Significantly hypertensive on initial arrival with rapid improvement after BiPAP. Continue home regimen  05-01-2023 continue with aldactone 100 mg qday, lasix 100 mg IV bid, toprol-xl 50 mg qday. ARB on hold due to aggressive diuresis plan.  Ascites 05-01-2023 likely due to amyloid  liver.. No indication for paracentesis at this point.   DVT prophylaxis: enoxaparin (LOVENOX) injection 30 mg Start: 04/30/23 2200     Code Status: Full Code Family Communication: no family at bedside Disposition Plan: home VS SNF vs hospice Reason for continuing need for hospitalization: remains on IV lasix q12h.  Objective: Vitals:   05/01/23 2341 05/02/23 0217 05/02/23 0355 05/02/23 0503  BP: 136/86 (!) 150/90 (!) 137/93   Pulse: (!) 103  (!) 101   Resp: 18 19 18    Temp: 98.1 F (36.7 C)  97.8 F (36.6 C)   TempSrc:      SpO2: 97% 98% 100%   Weight:    64.1 kg  Height:        Intake/Output Summary (Last 24 hours) at 05/02/2023 0754 Last data filed at 05/02/2023 0626 Gross per 24 hour  Intake 240 ml  Output 1450 ml  Net -1210 ml   Filed Weights   04/30/23 1501 05/02/23 0503  Weight: 69.9 kg 64.1 kg    Examination:  Physical Exam Vitals and nursing note reviewed.  Constitutional:      General: She is not in acute distress.    Appearance: She is not toxic-appearing or diaphoretic.     Comments: Appears chronically ill, frail  HENT:     Head: Normocephalic and atraumatic.  Eyes:  General: No scleral icterus. Cardiovascular:     Rate and Rhythm: Regular rhythm. Tachycardia present.  Pulmonary:     Effort: Pulmonary effort is normal. No respiratory distress.     Breath sounds: Normal breath sounds.  Abdominal:     General: There is distension.     Palpations: Abdomen is soft. There is shifting dullness and fluid wave.     Tenderness: There is no abdominal tenderness.     Comments: Bedside abd U/S shows moderate ascites in RUQ, RLQ, LLQ. Minimal ascites in LUQ.  Musculoskeletal:     Right ankle: Swelling present.     Left ankle: Swelling present.     Comments: Trace ankle edema bilaterally  Skin:    General: Skin is warm and dry.     Capillary Refill: Capillary refill takes less than 2 seconds.  Neurological:     Mental Status: She is alert.   Psychiatric:     Comments: Thoughts are tangential. Jumping from one topic to another is rapid fashion. Seems obsessed with non-working clock in her ER room.     Data Reviewed: I have personally reviewed following labs and imaging studies  CBC: Recent Labs  Lab 04/30/23 0814 05/01/23 0531  WBC 12.9* 9.4  NEUTROABS 6.7 6.6  HGB 12.8 9.9*  HCT 38.6 29.1*  MCV 77.5* 77.0*  PLT 470* 338   Basic Metabolic Panel: Recent Labs  Lab 04/30/23 0814 04/30/23 1532 05/01/23 0531 05/02/23 0437  NA 140  --  140 138  K 4.5  --  4.2 4.1  CL 105  --  108 104  CO2 21*  --  21* 21*  GLUCOSE 154*  --  119* 128*  BUN 36*  --  42* 44*  CREATININE 1.44*  --  1.64* 1.69*  CALCIUM 9.2  --  8.4* 8.9  MG  --  1.6* 2.3 2.0  PHOS  --   --  4.3 3.9   GFR: Estimated Creatinine Clearance: 23.7 mL/min (A) (by C-G formula based on SCr of 1.69 mg/dL (H)). Liver Function Tests: Recent Labs  Lab 04/30/23 0814  AST 37  ALT 21  ALKPHOS 350*  BILITOT 1.3*  PROT 7.2  ALBUMIN 3.0*   BNP (last 3 results) Recent Labs    04/30/23 0814  BNP 2,191.0*   CBG: Recent Labs  Lab 04/30/23 2136 05/01/23 1210 05/01/23 1607 05/01/23 2039 05/02/23 0359  GLUCAP 119* 197* 206* 149* 142*   Sepsis Labs: Recent Labs  Lab 04/30/23 0951 04/30/23 1203 05/01/23 1215  PROCALCITON  --   --  5.37  LATICACIDVEN 2.0* 1.2  --      Radiology Studies: CT CHEST WO CONTRAST Result Date: 04/30/2023 CLINICAL DATA:  Pleural effusion, known or suspected (Ped 0-17y). Shortness of breath. EXAM: CT CHEST WITHOUT CONTRAST TECHNIQUE: Multidetector CT imaging of the chest was performed following the standard protocol without IV contrast. RADIATION DOSE REDUCTION: This exam was performed according to the departmental dose-optimization program which includes automated exposure control, adjustment of the mA and/or kV according to patient size and/or use of iterative reconstruction technique. COMPARISON:  None Available.  FINDINGS: Cardiovascular: Normal cardiac size. No pericardial effusion. No aortic aneurysm. There are coronary artery calcifications, in keeping with coronary artery disease. There are also mild-to-moderate peripheral atherosclerotic vascular calcifications of thoracic aorta and its major branches. There is dilation of the main pulmonary trunk measuring up to 2.9 cm, which is nonspecific but can be seen with pulmonary artery hypertension. Mediastinum/Nodes: Visualized thyroid gland appears  grossly unremarkable. No solid / cystic mediastinal masses. The esophagus is nondistended precluding optimal assessment. No mediastinal or axillary lymphadenopathy by size criteria. Evaluation of bilateral hila is limited due to lack on intravenous contrast: however, no large hilar lymphadenopathy identified. Lungs/Pleura: The central tracheo-bronchial tree is patent. There are heterogeneous opacities throughout bilateral lungs, compatible with multilobar pneumonia. There also bilateral small pleural effusions. There superimposed atelectatic changes in the left lower lobe as well. No pneumothorax or suspicious lung mass. Upper Abdomen: There is a subcapsular 1.3 x 1.8 cm hypoattenuating structure in the left hepatic dome, not well evaluated on the current exam but characterized as a cyst on the prior exam. There is at least moderate ascites. There is a small sliding hiatal hernia. Remaining visualized upper abdominal viscera within normal limits. Musculoskeletal: The visualized soft tissues of the chest wall are grossly unremarkable. No suspicious osseous lesions. There are mild multilevel degenerative changes in the visualized spine. IMPRESSION: 1. Bilateral multilobar pneumonia and bilateral small pleural effusions. Follow-up to clearing is recommended. 2. Multiple other nonacute observations, as described above. Aortic Atherosclerosis (ICD10-I70.0). Electronically Signed   By: Jules Schick M.D.   On: 04/30/2023 15:39   DG  Chest Port 1 View Result Date: 04/30/2023 CLINICAL DATA:  Shortness of breath bilateral feet swelling EXAM: PORTABLE CHEST 1 VIEW COMPARISON:  07/19/2022 FINDINGS: Mild cardiomegaly. Diffuse bilateral airspace disease, left greater than right. Suspect small layering effusions. No acute bony abnormality. IMPRESSION: Flow lung volumes with bilateral airspace disease which could reflect asymmetric edema or infection. Suspect layering effusions. Electronically Signed   By: Charlett Nose M.D.   On: 04/30/2023 10:11    Scheduled Meds:  acyclovir  400 mg Oral BID   enoxaparin (LOVENOX) injection  30 mg Subcutaneous Q24H   ferrous sulfate  325 mg Oral Q breakfast   gabapentin  100 mg Oral BID   insulin aspart  0-15 Units Subcutaneous TID WC   insulin glargine-yfgn  5 Units Subcutaneous QHS   metoprolol succinate  50 mg Oral Daily   pantoprazole  40 mg Oral Daily   sertraline  50 mg Oral Daily   sodium chloride flush  3 mL Intravenous Q12H   spironolactone  100 mg Oral Daily   Continuous Infusions:  cefTRIAXone (ROCEPHIN)  IV 1 g (05/02/23 0735)     LOS: 2 days   Time spent: 45 minutes  Carollee Herter, DO  [facility]  05/02/2023, 7:54 AM

## 2023-05-01 NOTE — Subjective & Objective (Addendum)
Pt seen and examined. Met with pt's dtr [name] at bedside. LE edema has resolved. Discussed with cardiology. Pt is stable to be discharged. F/u with cards and nephrology as outpatient.  Pt wants to go home.  Walked 110 feet with PT today.

## 2023-05-01 NOTE — Assessment & Plan Note (Addendum)
05-01-2023 likely due to amyloid liver.. No indication for paracentesis at this point.  05-02-2023 stable. No indication for paracentesis.

## 2023-05-01 NOTE — Consult Note (Signed)
Consultation Note Date: 05/01/2023 at 1210  Patient Name: Nancy Rodriguez  DOB: 05-07-1945  MRN: [id number]  Age / Sex: 78 y.o., female  PCP: Mick Sell, MD Referring Physician: Carollee Herter, DO  HPI/Patient Profile: 78 y.o. 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   As per daughter, patient was experiencing bilateral lower extremity swelling PTA.  It was difficult to control at home.  However, her torsemide was increased to 100 mg twice daily and symptoms improved before plateauing.  1/14, patient developed sudden onset of shortness of breath and presented to the ED.  As per chart review, patient's amyloidosis involves the liver, bone marrow, and kidney (confirmed by biopsy-2022).  She underwent treatment with care aromatherapy which has been held since January 2024.  Patient is being treated for acute on chronic systolic CHF exacerbation.  Chest x-ray revealed bilateral airspace disease with suspected layering effusions.  Cardiology was consulted.  Patient is not a candidate for any further treatment for her amyloidosis and recommended a palliative care consult for goals of care, CODE STATUS, and hospice discussion.   Clinical Assessment and Goals of Care: Extensive chart review completed prior to meeting patient including labs, vital signs, imaging, progress notes, orders, and available advanced directive documents from current and previous encounters. I then met with patient at bedside to discuss diagnosis prognosis, GOC, EOL wishes, disposition and options.  I introduced Palliative Medicine as specialized medical care for people living with serious illness. It focuses on providing  relief from the symptoms and stress of a serious illness. The goal is to improve quality of life for both the patient and the family.  I attempted to elicit patient's understanding of her current medical situation.  She says that she was just told she does not have pneumonia and is happy about that.  She shares that she is feeling well.  She says she came in with shortness of breath but feels back to normal now.    She remains on the phone with her son throughout our discussion.  The lab technician enters the room to draw blood and RN is at bedside attempting to start a new IV.  Thought there were several distractions/people in the room during my visit, I attempted to assess patient's symptoms.  She says that she feels fine, the food tastes good, and that the room is much nicer now (than in the ED).  She was resistant to answering my direct questions.  She changed the subject to other topics frequently.  I shared with patient that I would like to discuss her current illness as well as her ongoing comorbidities.  She shares that she would like to talk to me later.  She shares with her son on the phone that I am here to talk to her about resuscitation.  I highlighted that palliative medicine  discusses his code status but also reviews patient's goals and boundaries of care.    I asked if there were any family members that I could speak with or that she would like for me to contact to be part of her goals of care discussion.  She did not give me a clear answer.  She continued to speak with her son on the phone throughout our discussion.  Patient in agreement for me to return at a later date and time.  PMT will continue to follow and support patient and family throughout her hospitalization.  Primary Decision Maker PATIENT  Physical Exam Vitals reviewed.  Constitutional:      General: She is not in acute distress.    Appearance: She is normal weight.  HENT:     Head: Normocephalic.   Cardiovascular:     Rate and Rhythm: Normal rate.  Skin:    General: Skin is warm and dry.  Neurological:     Mental Status: She is alert and oriented to person, place, and time.  Psychiatric:        Mood and Affect: Mood normal. Mood is not anxious.        Behavior: Behavior normal. Behavior is not agitated.    Palliative Assessment/Data: 60%     Thank you for this consult. Palliative medicine will continue to follow and assist holistically.   Time Total: 40 minutes  Time spent includes: Detailed review of medical records (labs, imaging, vital signs), medically appropriate exam (mental status, respiratory, cardiac, skin), discussed with treatment team, counseling and educating patient, family and staff, documenting clinical information, medication management and coordination of care.  Signed by: Georgiann Cocker, DNP, FNP-BC Palliative Medicine   Please contact Palliative Medicine Team providers via [facility] for questions and concerns.

## 2023-05-01 NOTE — ED Notes (Signed)
 Pt refuses temperature at this time, states she is eating ice and it will not be accurate.

## 2023-05-01 NOTE — Assessment & Plan Note (Addendum)
05-01-2023 likely the cause of her ascites. No indication for paracentesis.  05-02-2023 stable. No indication for paracentesis.  05-03-2023 stable. Has ascites. No indication for paracentesis.

## 2023-05-01 NOTE — Hospital Course (Addendum)
 HPI: Nancy Rodriguez is a 78 y.o. 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   History obtained from patient's daughter at bedside due to patient's sleepiness.  She states that patient has been experiencing bilateral lower extremity swelling that has been difficult to control for the last several weeks, however since her torsemide  was increased to 100 mg twice daily, symptoms improved before plateauing.  Then today, she developed sudden onset shortness of breath.  Nancy Rodriguez did not mention any chest pain or palpitations.  No recent illness, fever, chills, nausea, vomiting, diarrhea.  Significant Events: Admitted 04/30/2023 acute on chronic systolic CHF exacerbation   Significant Labs: VBG with pH of 7.17 and pCO2 of 62  WBC of 12.9, platelets of 470, bicarb 21, glucose 154, BUN 36, creatinine 1.44, alkaline phosphatase 350, and GFR of 37. BNP 2191 with troponin of 68. Lactic acid 2.0.   Significant Imaging Studies: Chest x-ray was obtained that demonstrated bilateral airspace disease with suspected layering effusions   Antibiotic Therapy: Anti-infectives (From admission, onward)    Start     Dose/Rate Route Frequency Ordered Stop   04/30/23 2200  acyclovir  (ZOVIRAX ) 200 MG capsule 400 mg        400 mg Oral 2 times daily 04/30/23 2159     04/30/23 1145  cefTRIAXone  (ROCEPHIN ) 2 g in sodium chloride  0.9 % 100 mL IVPB        2 g 200 mL/hr over 30 Minutes Intravenous Once 04/30/23 1132 04/30/23 1243   04/30/23 1145  azithromycin  (ZITHROMAX ) 500 mg in sodium chloride  0.9 % 250 mL IVPB        500 mg 250 mL/hr over 60 Minutes Intravenous  Once 04/30/23 1132 04/30/23 1347       Procedures:   Consultants: Cardiology Palliative care

## 2023-05-01 NOTE — Progress Notes (Signed)
 [facility] CLINIC CARDIOLOGY PROGRESS NOTE       Patient ID: Nancy Rodriguez MRN: [id number] DOB/AGE: 1945/09/08 78 y.o.  Admit date: 04/30/2023 Referring Physician Dr. Avi Body  Primary Physician Harwood Lingo Merri Abbe, MD  Primary Cardiologist Dr. Bob Burn Reason for Consultation AoCHF  HPI: Nancy Rodriguez is a 78 y.o. 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 Cardiology was consulted for further evaluation.   Interval history: -Patient reports she is feeling better overall today.  -SOB is improved, now weaned to room air.  -LE edema much improved, patient also reports she feels like abdominal distention is better.  -BP and HR remain stable.   Review of systems complete and found to be negative unless listed above    Past Medical History:  Diagnosis Date   (HFpEF) heart failure with preserved ejection fraction (HCC)    a. 05/2020 Echo: EF 60-65%; b. 08/2020 cMRI ([facility]): EF 71%, no delayed hyperenhancement to suggest scar/infiltrative dzs. Nl RV fxn. Mild BAE. Triv MR, mild TR; c. 06/2021 Echo: EF 50-55%, no rwma, mild basal-septal LVH, GrII DD. Nl RV size/fxn. Mild MR. Ao sclerosis.   AL amyloidosis (HCC)    Anemia due to chronic kidney disease    Anemia in chronic kidney disease   CKD (chronic kidney disease), stage III (HCC)    Diabetes mellitus without complication (HCC)    History of cardiovascular stress test    a. 03/2012 Stress Echo([facility]): nl study.   Hypercholesterolemia    Hypertension    MGUS (monoclonal gammopathy of unknown significance)    Osteoarthritis    Rheumatoid arthritis (HCC)     Past Surgical History:  Procedure Laterality Date   HERNIA REPAIR     INTRAMEDULLARY (IM) NAIL INTERTROCHANTERIC Right 09/26/2021   Procedure: INTRAMEDULLARY (IM) NAIL INTERTROCHANTRIC;   Surgeon: Rande Bushy, MD;  Location: [facility] ORS;  Service: Orthopedics;  Laterality: Right;   PARATHYROIDECTOMY      Medications Prior to Admission  Medication Sig Dispense Refill Last Dose/Taking   acyclovir  (ZOVIRAX ) 400 MG tablet Take 1 tablet (400 mg total) by mouth 2 (two) times daily. 60 tablet 2 04/29/2023 Morning   albuterol  (VENTOLIN  HFA) 108 (90 Base) MCG/ACT inhaler Inhale 2 puffs into the lungs every 4 (four) hours as needed for wheezing or shortness of breath.   Taking As Needed   amLODipine  (NORVASC ) 5 MG tablet Take 5 mg by mouth daily.   Past Week   ascorbic acid (VITAMIN C ) 500 MG tablet Take 500 mg by mouth daily.   04/29/2023   bismuth subsalicylate (PEPTO BISMOL) 262 MG/15ML suspension Take 30 mLs by mouth every 6 (six) hours as needed for indigestion or diarrhea or loose stools.   Taking As Needed   EPINEPHrine 0.3 mg/0.3 mL IJ SOAJ injection Inject 0.3 mg into the muscle as needed.   Taking As Needed   ferrous sulfate  325 (65 FE) MG tablet TAKE 1 TABLET BY MOUTH EVERY DAY 60 tablet 0 Past Week   gabapentin  (NEURONTIN ) 100 MG capsule Take 100 mg by mouth 2 (two) times daily.   04/29/2023 Morning   HUMALOG KWIKPEN 100 UNIT/ML KwikPen Inject 0-8 Units into the skin 3 (three) times daily. Per sliding scale.   04/29/2023 Morning   hydrocortisone (ANUSOL-HC) 25 MG suppository Place 25 mg rectally 2 (two) times daily.  Taking   LANTUS  SOLOSTAR 100 UNIT/ML Solostar Pen Inject 7 Units into the skin daily.   04/29/2023   loperamide  (IMODIUM ) 2 MG capsule Take 1 capsule (2 mg total) by mouth See admin instructions. Initial: 4 mg, followed by 2 mg after each loose stool, maximum 16 mg within 24 hours. 60 capsule 0 Taking   losartan (COZAAR) 25 MG tablet Take 25 mg by mouth daily.   Taking   omeprazole  (PRILOSEC) 20 MG capsule Take 20 mg by mouth 2 (two) times daily.   04/29/2023 Morning   potassium chloride  SA (KLOR-CON  M) 20 MEQ tablet Take 20 mEq by mouth daily.   Past Month    sertraline  (ZOLOFT ) 25 MG tablet Take 50 mg by mouth daily.   04/29/2023   spironolactone  (ALDACTONE ) 100 MG tablet Take 100 mg by mouth 2 (two) times daily.   04/29/2023   torsemide  (DEMADEX ) 100 MG tablet Take 100 mg by mouth daily.   Past Week   traMADol  (ULTRAM ) 50 MG tablet Take 1 tablet (50 mg total) by mouth every 12 (twelve) hours as needed for moderate pain. 10 tablet 0 Taking As Needed   bumetanide (BUMEX) 2 MG tablet Take 2 mg by mouth daily. (Patient not taking: Reported on 04/30/2023)   Not Taking   guaiFENesin-codeine 100-10 MG/5ML syrup Take by mouth. (Patient not taking: Reported on 10/25/2022)   Not Taking   hydrALAZINE  (APRESOLINE ) 25 MG tablet Take 1 tablet by mouth 3 (three) times daily. (Patient not taking: Reported on 04/30/2023)   Not Taking   metoprolol  succinate (TOPROL -XL) 50 MG 24 hr tablet Take 50 mg by mouth daily.      polyethylene glycol (MIRALAX  / GLYCOLAX ) 17 g packet Take 17 g by mouth daily as needed for mild constipation. (Patient not taking: Reported on 04/30/2023) 14 each 0 Not Taking   torsemide  (DEMADEX ) 20 MG tablet Take 20 mg by mouth daily as needed. (Patient not taking: Reported on 04/30/2023)   Not Taking   Social History   Socioeconomic History   Marital status: Widowed    Spouse name: Not on file   Number of children: 6   Years of education: Not on file   Highest education level: Not on file  Occupational History   Not on file  Tobacco Use   Smoking status: Never   Smokeless tobacco: Never  Vaping Use   Vaping status: Never Used  Substance and Sexual Activity   Alcohol use: No   Drug use: Never   Sexual activity: Not on file  Other Topics Concern   Not on file  Social History Narrative   Not on file   Social Drivers of Health   Financial Resource Strain: Low Risk  (03/29/2023)   Received from [facility]   Overall Financial Resource Strain (CARDIA)    Difficulty of Paying Living Expenses: Not hard at all  Food  Insecurity: No Food Insecurity (05/01/2023)   Hunger Vital Sign    Worried About Running Out of Food in the Last Year: Never true    Ran Out of Food in the Last Year: Never true  Transportation Needs: No Transportation Needs (05/01/2023)   PRAPARE - Administrator, Civil Service (Medical): No    Lack of Transportation (Non-Medical): No  Physical Activity: Not on file  Stress: Not on file  Social Connections: Moderately Integrated (05/01/2023)   Social Connection and Isolation Panel [NHANES]    Frequency of Communication with Friends and Family:  More than three times a week    Frequency of Social Gatherings with Friends and Family: More than three times a week    Attends Religious Services: More than 4 times per year    Active Member of Clubs or Organizations: Yes    Attends Banker Meetings: More than 4 times per year    Marital Status: Widowed  Intimate Partner Violence: Not At Risk (05/01/2023)   Humiliation, Afraid, Rape, and Kick questionnaire    Fear of Current or Ex-Partner: No    Emotionally Abused: No    Physically Abused: No    Sexually Abused: No    Family History  Problem Relation Age of Onset   Diabetes Daughter    Diabetes Son    Dementia Mother    Cancer Father    Esophageal cancer Sister    Brain cancer Brother      Vitals:   05/01/23 0830 05/01/23 1030 05/01/23 1152 05/01/23 1206  BP: 131/80 (!) 155/92 132/87   Pulse: (!) 102 (!) 106 (!) 101   Resp: 14 (!) 22 18   Temp:   (!) 97.5 F (36.4 C)   TempSrc:   Oral   SpO2: 97% 94% 95% 95%  Weight:      Height:        PHYSICAL EXAM General: Chronically ill appearing elderly female, well nourished, in no acute distress. HEENT: Normocephalic and atraumatic. Neck: No JVD.  Lungs: Normal respiratory effort on room air.  Bibasilar crackles.  Heart: HRRR. Normal S1 and S2 without gallops or murmurs.  Abdomen: Non-distended appearing.  Msk: Normal strength and tone for age. Extremities:  Warm and well perfused. No clubbing, cyanosis.  1+ pitting edema.  Neuro: Alert and oriented X 3. Psych: Answers questions appropriately.   Labs: Basic Metabolic Panel: Recent Labs    04/30/23 0814 04/30/23 1532 05/01/23 0531  NA 140  --  140  K 4.5  --  4.2  CL 105  --  108  CO2 21*  --  21*  GLUCOSE 154*  --  119*  BUN 36*  --  42*  CREATININE 1.44*  --  1.64*  CALCIUM  9.2  --  8.4*  MG  --  1.6* 2.3  PHOS  --   --  4.3   Liver Function Tests: Recent Labs    04/30/23 0814  AST 37  ALT 21  ALKPHOS 350*  BILITOT 1.3*  PROT 7.2  ALBUMIN 3.0*   No results for input(s): "LIPASE", "AMYLASE" in the last 72 hours. CBC: Recent Labs    04/30/23 0814 05/01/23 0531  WBC 12.9* 9.4  NEUTROABS 6.7 6.6  HGB 12.8 9.9*  HCT 38.6 29.1*  MCV 77.5* 77.0*  PLT 470* 338   Cardiac Enzymes: Recent Labs    04/30/23 0814 04/30/23 1203 04/30/23 1532  TROPONINIHS 68* 210* 211*   BNP: Recent Labs    04/30/23 0814  BNP 2,191.0*   D-Dimer: No results for input(s): "DDIMER" in the last 72 hours. Hemoglobin A1C: No results for input(s): "HGBA1C" in the last 72 hours. Fasting Lipid Panel: No results for input(s): "CHOL", "HDL", "LDLCALC", "TRIG", "CHOLHDL", "LDLDIRECT" in the last 72 hours. Thyroid  Function Tests: No results for input(s): "TSH", "T4TOTAL", "T3FREE", "THYROIDAB" in the last 72 hours.  Invalid input(s): "FREET3" Anemia Panel: No results for input(s): "VITAMINB12", "FOLATE", "FERRITIN", "TIBC", "IRON ", "RETICCTPCT" in the last 72 hours.   Radiology: CT CHEST WO CONTRAST Result Date: 04/30/2023 CLINICAL DATA:  Pleural effusion, known  or suspected (Ped 0-17y). Shortness of breath. EXAM: CT CHEST WITHOUT CONTRAST TECHNIQUE: Multidetector CT imaging of the chest was performed following the standard protocol without IV contrast. RADIATION DOSE REDUCTION: This exam was performed according to the departmental dose-optimization program which includes automated exposure  control, adjustment of the mA and/or kV according to patient size and/or use of iterative reconstruction technique. COMPARISON:  None Available. FINDINGS: Cardiovascular: Normal cardiac size. No pericardial effusion. No aortic aneurysm. There are coronary artery calcifications, in keeping with coronary artery disease. There are also mild-to-moderate peripheral atherosclerotic vascular calcifications of thoracic aorta and its major branches. There is dilation of the main pulmonary trunk measuring up to 2.9 cm, which is nonspecific but can be seen with pulmonary artery hypertension. Mediastinum/Nodes: Visualized thyroid  gland appears grossly unremarkable. No solid / cystic mediastinal masses. The esophagus is nondistended precluding optimal assessment. No mediastinal or axillary lymphadenopathy by size criteria. Evaluation of bilateral hila is limited due to lack on intravenous contrast: however, no large hilar lymphadenopathy identified. Lungs/Pleura: The central tracheo-bronchial tree is patent. There are heterogeneous opacities throughout bilateral lungs, compatible with multilobar pneumonia. There also bilateral small pleural effusions. There superimposed atelectatic changes in the left lower lobe as well. No pneumothorax or suspicious lung mass. Upper Abdomen: There is a subcapsular 1.3 x 1.8 cm hypoattenuating structure in the left hepatic dome, not well evaluated on the current exam but characterized as a cyst on the prior exam. There is at least moderate ascites. There is a small sliding hiatal hernia. Remaining visualized upper abdominal viscera within normal limits. Musculoskeletal: The visualized soft tissues of the chest wall are grossly unremarkable. No suspicious osseous lesions. There are mild multilevel degenerative changes in the visualized spine. IMPRESSION: 1. Bilateral multilobar pneumonia and bilateral small pleural effusions. Follow-up to clearing is recommended. 2. Multiple other nonacute  observations, as described above. Aortic Atherosclerosis (ICD10-I70.0). Electronically Signed   By: Beula Brunswick M.D.   On: 04/30/2023 15:39   DG Chest Port 1 View Result Date: 04/30/2023 CLINICAL DATA:  Shortness of breath bilateral feet swelling EXAM: PORTABLE CHEST 1 VIEW COMPARISON:  07/19/2022 FINDINGS: Mild cardiomegaly. Diffuse bilateral airspace disease, left greater than right. Suspect small layering effusions. No acute bony abnormality. IMPRESSION: Flow lung volumes with bilateral airspace disease which could reflect asymmetric edema or infection. Suspect layering effusions. Electronically Signed   By: Janeece Mechanic M.D.   On: 04/30/2023 10:11    ECHO 03/18/2023: SEVERE LEFT VENTRICULAR SYSTOLIC DYSFUNCTION WITH SEVERE LVH  ESTIMATED EF: 25%, CALC EF(2D): 26%  NORMAL LA PRESSURES WITH DIASTOLIC DYSFUNCTION (GRADE 1)  NORMAL RIGHT VENTRICULAR SYSTOLIC FUNCTION  VALVULAR REGURGITATION: TRIVIAL AR, MILD MR, MILD PR, MILD TR  VALVULAR STENOSIS: MILD AS, No MS, No PS, No TS   TELEMETRY reviewed by me 05/01/2023: Sinus tachycardia with first-degree AVB rate 100s  EKG reviewed by me: Sinus tachycardia rate 141 bpm  Data reviewed by me 05/01/2023: last 24h vitals tele labs imaging I/O hospitalist progress note  Principal Problem:   Acute on chronic HFrEF (heart failure with reduced ejection fraction) (HCC) Active Problems:   Amyloidosis (HCC)   Essential hypertension   Hepatic cirrhosis (HCC)   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HCC)   Chronic kidney disease, stage 3b (HCC) - baseline Scr 1.2-1.5   Ascites   Insulin  dependent type 2 diabetes mellitus (HCC)   Acute respiratory failure with hypoxia and hypercapnia (HCC)   NSTEMI (non-ST elevated myocardial infarction) (HCC)    ASSESSMENT AND PLAN:  Nancy Rodriguez is  a 78 y.o. 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 Cardiology was consulted for further evaluation.   # Acute HFrEF # Advanced AL lambda light chain amyloidosis # Demand ischemia Patient with history of advanced AL amyloidosis s/p chemotherapy which has been held since January 2024 presenting with worsening shortness of breath and lower extremity edema.  She recently underwent an echocardiogram outpatient at our clinic 03/2023 which revealed newly reduced EF at 25%.  Since this time her p.o. diuretic dose has been increased outpatient but she has had persistent symptoms.  BNP elevated at 2191.  Troponins trended 68 > 210 > 211.  EKG demonstrated sinus tachycardia. -No plan for any further cardiac imaging or workup at this time. -Will plan for one more dose of IV lasix  100 mg this afternoon and consider transition to po tomorrow.  -Overall plan will be for symptom management with diuresis. -Troponin elevation most consistent with demand/supply mismatch and not ACS in the setting of acute HFrEF. -Palliative consulted by primary team.   This patient's plan of care was discussed and created with Dr. Bob Burn and he is in agreement.  Signed: Hamp Levine, PA-C  05/01/2023, 2:01 PM [facility] Cardiology

## 2023-05-01 NOTE — Plan of Care (Signed)

## 2023-05-01 NOTE — Consult Note (Addendum)
 PHARMACY CONSULT NOTE - FOLLOW UP  Pharmacy Consult for Electrolyte Monitoring and Replacement   Recent Labs: Potassium (mmol/L)  Date Value  05/01/2023 4.2   Magnesium  (mg/dL)  Date Value  82/95/6213 2.3   Calcium  (mg/dL)  Date Value  08/65/7846 8.4 (L)   Albumin (g/dL)  Date Value  96/29/5284 3.0 (L)   Phosphorus (mg/dL)  Date Value  13/24/4010 4.3   Sodium (mmol/L)  Date Value  05/01/2023 140   Diet: Carb/Healthy Fluids: NA Pertinent Medications Furosemide  100 mg BID Spironolactone  200 mg BID  Assessment: Nancy Rodriguez is a 78 yo female who presented with worsening shortness of breath and tachypnea over the last few days. They are admitted for acute HFrEF. 03/2023 EF 25%. BNP 2191, Troponin elevated. Scr 1.44 from a baseline around 0.98. Pharmacy has been consulted to manage this patient's electrolytes.   Goal of Therapy:  Electrolytes WNL 05/01/2023 K = 4.2 Mg = 2.3 Phos = 4.3  Plan: No replacement needed at this time Re-check BMP, Mg, with AM labs  Thank you for allowing pharmacy to participate in this patient's care.  Craven Do, PharmD Pharmacy Resident  05/01/2023 7:06 AM

## 2023-05-01 NOTE — Evaluation (Signed)
 Physical Therapy Evaluation Patient Details Name: Nancy Rodriguez MRN: [id number] DOB: 09-19-1945 Today's Date: 05/01/2023  History of Present Illness  78 y/o female presented to ED on 04/30/23 for SOB and bilateral feet swelling. Admitted for acute on chronic HFrEF and acute respiratory failure. PMH: NSTEMI, CKD stage 3b, T2DM, HTN, hepatic cirrhosis  Clinical Impression  Patient admitted with the above. PTA, patient lives with her daughter and she reports that her daughter assists with ADLs, iADLs, and mobility with RW vs quad cane. Follows commands well during session but tangential about situation and she is unsure of diagnosis. Patient presents with weakness, impaired balance, and decreased activity tolerance. Required minA for bed mobility and CGA for sit to stand with quad cane. Ambulated 20' with quad cane and CGA. On RA throughout, spO2 drop as low as 87% but quickly returns to >90%. Patient will benefit from skilled PT services during acute stay to address listed deficits. Patient will benefit from ongoing therapy at discharge to maximize functional independence and safety.         If plan is discharge home, recommend the following: A little help with walking and/or transfers;A little help with bathing/dressing/bathroom;Assistance with cooking/housework;Direct supervision/assist for financial management;Direct supervision/assist for medications management;Assist for transportation;Help with stairs or ramp for entrance   Can travel by private vehicle        Equipment Recommendations None recommended by PT  Recommendations for Other Services       Functional Status Assessment Patient has had a recent decline in their functional status and demonstrates the ability to make significant improvements in function in a reasonable and predictable amount of time.     Precautions / Restrictions Precautions Precautions: Fall Restrictions Weight Bearing Restrictions Per Provider Order: No       Mobility  Bed Mobility Overal bed mobility: Needs Assistance Bed Mobility: Supine to Sit, Sit to Supine     Supine to sit: Min assist Sit to supine: Min assist        Transfers Overall transfer level: Needs assistance Equipment used: Quad cane Transfers: Sit to/from Stand Sit to Stand: Contact guard assist                Ambulation/Gait Ambulation/Gait assistance: Contact guard assist Gait Distance (Feet): 15 Feet Assistive device: Quad cane Gait Pattern/deviations: Step-to pattern, Decreased stride length Gait velocity: decreased     General Gait Details: CGA for safety. No LOB noted during short mobility  Stairs            Wheelchair Mobility     Tilt Bed    Modified Rankin (Stroke Patients Only)       Balance Overall balance assessment: Needs assistance Sitting-balance support: No upper extremity supported, Feet supported Sitting balance-Leahy Scale: Fair     Standing balance support: Single extremity supported, Reliant on assistive device for balance Standing balance-Leahy Scale: Fair                               Pertinent Vitals/Pain Pain Assessment Pain Assessment: No/denies pain    Home Living Family/patient expects to be discharged to:: Private residence Living Arrangements: Children Available Help at Discharge: Family;Available 24 hours/day Type of Home: House Home Access: Stairs to enter Entrance Stairs-Rails: Right;Left;Can reach both Entrance Stairs-Number of Steps: 3   Home Layout: One level Home Equipment: Cane - Programmer, applications (2 wheels);BSC/3in1;Hospital bed;Grab bars - tub/shower;Shower seat      Prior Function Prior  Level of Function : History of Falls (last six months)             Mobility Comments: fallen 13x in the past 6 months ADLs Comments: pt reports daugther does "everything" for her     Extremity/Trunk Assessment   Upper Extremity Assessment Upper Extremity Assessment:  Defer to OT evaluation    Lower Extremity Assessment Lower Extremity Assessment: Generalized weakness       Communication   Communication Communication: No apparent difficulties  Cognition Arousal: Alert Behavior During Therapy: WFL for tasks assessed/performed Overall Cognitive Status: No family/caregiver present to determine baseline cognitive functioning                                 General Comments: able to answer questions appropriately. Seem generally confused on entire situation        General Comments General comments (skin integrity, edema, etc.): Decreased O2 to RA during mobility with spO2 drop as low as 87% with mobility but quickly returns to >90%    Exercises     Assessment/Plan    PT Assessment Patient needs continued PT services  PT Problem List Decreased strength;Decreased activity tolerance;Decreased balance;Decreased mobility;Decreased cognition;Decreased knowledge of use of DME;Decreased safety awareness;Decreased knowledge of precautions;Cardiopulmonary status limiting activity       PT Treatment Interventions DME instruction;Gait training;Functional mobility training;Therapeutic activities;Therapeutic exercise;Balance training;Stair training;Patient/family education    PT Goals (Current goals can be found in the Care Plan section)  Acute Rehab PT Goals Patient Stated Goal: to go home PT Goal Formulation: With patient Time For Goal Achievement: 05/15/23 Potential to Achieve Goals: Good    Frequency Min 1X/week     Co-evaluation PT/OT/SLP Co-Evaluation/Treatment: Yes Reason for Co-Treatment: Necessary to address cognition/behavior during functional activity;For patient/therapist safety;To address functional/ADL transfers PT goals addressed during session: Mobility/safety with mobility;Balance         AM-PAC PT "6 Clicks" Mobility  Outcome Measure Help needed turning from your back to your side while in a flat bed without  using bedrails?: A Little Help needed moving from lying on your back to sitting on the side of a flat bed without using bedrails?: A Little Help needed moving to and from a bed to a chair (including a wheelchair)?: A Little Help needed standing up from a chair using your arms (e.g., wheelchair or bedside chair)?: A Little Help needed to walk in hospital room?: A Little Help needed climbing 3-5 steps with a railing? : A Lot 6 Click Score: 17    End of Session   Activity Tolerance: Patient tolerated treatment well Patient left: in bed;with call bell/phone within reach Nurse Communication: Mobility status;Other (comment) (O2 requirement) PT Visit Diagnosis: Unsteadiness on feet (R26.81);Muscle weakness (generalized) (M62.81)    Time: 0981-1914 PT Time Calculation (min) (ACUTE ONLY): 26 min   Charges:   PT Evaluation $PT Eval Moderate Complexity: 1 Mod   PT General Charges $$ ACUTE PT VISIT: 1 Visit         Nancy Rodriguez, PT, DPT Physical Therapist - [facility]  [facility]   Nancy Rodriguez 05/01/2023, 11:17 AM

## 2023-05-01 NOTE — Evaluation (Signed)
 Occupational Therapy Evaluation Patient Details Name: Nancy Rodriguez MRN: [id number] DOB: 29-Oct-1945 Today's Date: 05/01/2023   History of Present Illness 78 y/o female presented to ED on 04/30/23 for SOB and bilateral feet swelling. Admitted for acute on chronic HFrEF and acute respiratory failure. PMH: NSTEMI, CKD stage 3b, T2DM, HTN, hepatic cirrhosis   Clinical Impression   Chart reviewed, pt greeted in bed, alert and oriented to self, place, grossly to date. Pt is liable and reports she feels depressed regarding current status. PTA pt reports she lives with her daughter who provides assistance with ADL/IADL PRN, also has help at the house approx 6 days a week for 2-4 hrs per pt report. Pt report she amb with RW or quad cane. Pt presents with deficits in strength, endurance, activity tolerance, balance, cognition, affecting safe and optimal ADL performing. Supine<>sit performed with MIN A, STS with  CGA multiple attempts, amb in room with quad cane approx 15' with CGA. LB dressing completed with MAX A, setup for grooming/feeding tasks. Pt will benefit from acute OT to address deficits and to facilitate optimal ADL performance. Pt is left as received, all needs met. Nurse notified of pt status. OT will follow.       If plan is discharge home, recommend the following: A little help with walking and/or transfers;A little help with bathing/dressing/bathroom;Direct supervision/assist for medications management    Functional Status Assessment  Patient has had a recent decline in their functional status and demonstrates the ability to make significant improvements in function in a reasonable and predictable amount of time.  Equipment Recommendations  BSC/3in1    Recommendations for Other Services       Precautions / Restrictions Precautions Precautions: Fall Restrictions Weight Bearing Restrictions Per Provider Order: No      Mobility Bed Mobility Overal bed mobility: Needs  Assistance Bed Mobility: Supine to Sit, Sit to Supine     Supine to sit: Min assist Sit to supine: Min assist        Transfers Overall transfer level: Needs assistance Equipment used: Quad cane Transfers: Sit to/from Stand Sit to Stand: Contact guard assist           General transfer comment: from ED stretcher multiple attempts      Balance Overall balance assessment: Needs assistance Sitting-balance support: No upper extremity supported, Feet supported Sitting balance-Leahy Scale: Fair     Standing balance support: Single extremity supported, Reliant on assistive device for balance Standing balance-Leahy Scale: Fair                             ADL either performed or assessed with clinical judgement   ADL Overall ADL's : Needs assistance/impaired Eating/Feeding: Set up;Sitting   Grooming: Set up;Wash/dry face;Sitting               Lower Body Dressing: Moderate assistance;Sitting/lateral leans;Sit to/from stand Lower Body Dressing Details (indicate cue type and reason): socks and doff/donn new brief Toilet Transfer: Contact guard Marine scientist Details (indicate cue type and reason): simulated with quad cane, intermittent vcs for technique         Functional mobility during ADLs: Contact guard assist (approx 15' with quad cane)       Vision Patient Visual Report: No change from baseline       Perception         Praxis         Pertinent Vitals/Pain Pain Assessment Pain Assessment:  No/denies pain     Extremity/Trunk Assessment Upper Extremity Assessment Upper Extremity Assessment: Generalized weakness   Lower Extremity Assessment Lower Extremity Assessment: Generalized weakness       Communication Communication Communication: Difficulty following commands/understanding Following commands: Follows one step commands consistently;Follows one step commands with increased time Cueing Techniques: Verbal  cues;Tactile cues;Visual cues   Cognition Arousal: Alert Behavior During Therapy: WFL for tasks assessed/performed, Lability Overall Cognitive Status: No family/caregiver present to determine baseline cognitive functioning Area of Impairment: Attention, Safety/judgement, Following commands, Awareness, Problem solving, Orientation                 Orientation Level: Disoriented to, Situation Current Attention Level: Sustained   Following Commands: Follows one step commands with increased time, Follows one step commands consistently Safety/Judgement: Decreased awareness of deficits Awareness: Emergent Problem Solving: Slow processing, Decreased initiation, Difficulty sequencing, Requires verbal cues, Requires tactile cues General Comments: pt reports feeling sad and depressed multiple times     General Comments  spo2 to 87% on RA with mobility, quickly >90% on RA with PLB, rest break    Exercises Other Exercises Other Exercises: edu re: role of OT, role of rehab, discharge recommendations   Shoulder Instructions      Home Living Family/patient expects to be discharged to:: Private residence Living Arrangements: Children Available Help at Discharge: Family;Available 24 hours/day (pt also reports she has help come into the house 6 days a week approx 2-4 hrs per day) Type of Home: House Home Access: Stairs to enter [employer] of Steps: 3 Entrance Stairs-Rails: Right;Left;Can reach both Home Layout: One level     Bathroom Shower/Tub: Chief Strategy Officer: Standard     Home Equipment: Cane - Programmer, applications (2 wheels);BSC/3in1;Hospital bed;Grab bars - tub/shower;Shower seat          Prior Functioning/Environment Prior Level of Function : History of Falls (last six months)             Mobility Comments: fallen 13x in the past 6 months, pt reports she amb with RW or quad cane household distances ADLs Comments: pt reports daugther does  "everything" for her        OT Problem List: Decreased strength;Decreased activity tolerance;Impaired balance (sitting and/or standing);Decreased knowledge of use of DME or AE;Decreased safety awareness;Cardiopulmonary status limiting activity      OT Treatment/Interventions: Self-care/ADL training;Therapeutic exercise;DME and/or AE instruction;Therapeutic activities;Modalities;Patient/family education;Balance training    OT Goals(Current goals can be found in the care plan section) Acute Rehab OT Goals Patient Stated Goal: figure out what is going on OT Goal Formulation: With patient Time For Goal Achievement: 05/15/23 Potential to Achieve Goals: Good ADL Goals Pt Will Perform Grooming: with supervision;sitting Pt Will Perform Lower Body Dressing: with supervision;sitting/lateral leans;sit to/from stand Pt Will Transfer to Toilet: with supervision;ambulating Pt Will Perform Toileting - Clothing Manipulation and hygiene: with supervision;sitting/lateral leans;sit to/from stand  OT Frequency: Min 1X/week    Co-evaluation PT/OT/SLP Co-Evaluation/Treatment: Yes Reason for Co-Treatment: Necessary to address cognition/behavior during functional activity;For patient/therapist safety;To address functional/ADL transfers PT goals addressed during session: Mobility/safety with mobility;Balance OT goals addressed during session: ADL's and self-care      AM-PAC OT "6 Clicks" Daily Activity     Outcome Measure Help from another person eating meals?: None Help from another person taking care of personal grooming?: A Little Help from another person toileting, which includes using toliet, bedpan, or urinal?: None Help from another person bathing (including washing, rinsing, drying)?: A Little  Help from another person to put on and taking off regular upper body clothing?: None Help from another person to put on and taking off regular lower body clothing?: A Lot 6 Click Score: 20   End of  Session Equipment Utilized During Treatment: Other (comment) (Quad cane) Nurse Communication: Mobility status  Activity Tolerance: Patient tolerated treatment well;Patient limited by fatigue Patient left: in bed;with call bell/phone within reach;with bed alarm set  OT Visit Diagnosis: Other abnormalities of gait and mobility (R26.89);Muscle weakness (generalized) (M62.81)                Time: 1610-9604 OT Time Calculation (min): 22 min Charges:  OT General Charges $OT Visit: 1 Visit OT Evaluation $OT Eval Moderate Complexity: 1 Mod  Gerre Kraft, OTD OTR/L  05/01/23, 12:37 PM

## 2023-05-01 NOTE — TOC CM/SW Note (Signed)
 Please re-consult TOC once PT/OT evaluates and recommends HH/SNF.

## 2023-05-02 ENCOUNTER — Other Ambulatory Visit (HOSPITAL_COMMUNITY): Payer: Self-pay

## 2023-05-02 ENCOUNTER — Telehealth (HOSPITAL_COMMUNITY): Payer: Self-pay | Admitting: Pharmacy Technician

## 2023-05-02 ENCOUNTER — Encounter: Payer: Self-pay | Admitting: Oncology

## 2023-05-02 ENCOUNTER — Inpatient Hospital Stay: Payer: Medicare Other

## 2023-05-02 DIAGNOSIS — E8581 Light chain (AL) amyloidosis: Secondary | ICD-10-CM | POA: Diagnosis not present

## 2023-05-02 DIAGNOSIS — I509 Heart failure, unspecified: Secondary | ICD-10-CM | POA: Diagnosis not present

## 2023-05-02 DIAGNOSIS — I5023 Acute on chronic systolic (congestive) heart failure: Secondary | ICD-10-CM | POA: Diagnosis not present

## 2023-05-02 DIAGNOSIS — I2489 Other forms of acute ischemic heart disease: Secondary | ICD-10-CM

## 2023-05-02 DIAGNOSIS — K7460 Unspecified cirrhosis of liver: Secondary | ICD-10-CM | POA: Diagnosis not present

## 2023-05-02 DIAGNOSIS — J9601 Acute respiratory failure with hypoxia: Secondary | ICD-10-CM | POA: Diagnosis not present

## 2023-05-02 DIAGNOSIS — E854 Organ-limited amyloidosis: Secondary | ICD-10-CM | POA: Diagnosis not present

## 2023-05-02 LAB — BASIC METABOLIC PANEL
Anion gap: 13 (ref 5–15)
BUN: 44 mg/dL — ABNORMAL HIGH (ref 8–23)
CO2: 21 mmol/L — ABNORMAL LOW (ref 22–32)
Calcium: 8.9 mg/dL (ref 8.9–10.3)
Chloride: 104 mmol/L (ref 98–111)
Creatinine, Ser: 1.69 mg/dL — ABNORMAL HIGH (ref 0.44–1.00)
GFR, Estimated: 31 mL/min — ABNORMAL LOW (ref >60)
Glucose, Bld: 128 mg/dL — ABNORMAL HIGH (ref 70–99)
Potassium: 4.1 mmol/L (ref 3.5–5.1)
Sodium: 138 mmol/L (ref 135–145)

## 2023-05-02 LAB — MAGNESIUM: Magnesium: 2 mg/dL (ref 1.7–2.4)

## 2023-05-02 LAB — PHOSPHORUS: Phosphorus: 3.9 mg/dL (ref 2.5–4.6)

## 2023-05-02 LAB — GLUCOSE, CAPILLARY
Glucose-Capillary: 142 mg/dL — ABNORMAL HIGH (ref 70–99)
Glucose-Capillary: 107 mg/dL — ABNORMAL HIGH (ref 70–99)
Glucose-Capillary: 167 mg/dL — ABNORMAL HIGH (ref 70–99)
Glucose-Capillary: 178 mg/dL — ABNORMAL HIGH (ref 70–99)

## 2023-05-02 LAB — BLOOD GAS, VENOUS
Bicarbonate: 24.3 mmol/L (ref 20.0–28.0)
O2 Saturation: 53.9 mmol/L (ref 0.0–2.0)
Patient temperature: 37
Patient temperature: 53.9 %
pCO2, Ven: 43 mm[Hg] — ABNORMAL LOW (ref 44–60)
pH, Ven: 7.36 (ref 7.25–7.43)
pO2, Ven: 24.3 mmol/L (ref 32–45)

## 2023-05-02 MED ORDER — TORSEMIDE 20 MG PO TABS: 60.0000 mg | ORAL_TABLET | Freq: Two times a day (BID) | ORAL | Status: DC

## 2023-05-02 MED ORDER — CEFTRIAXONE SODIUM 1 G IJ SOLR
1.0000 g | INTRAMUSCULAR | Status: DC
  Administered 2023-05-02 – 2023-05-03 (×2): 1 g via INTRAVENOUS
  Filled 2023-05-02 (×2): qty 10

## 2023-05-02 MED ORDER — TORSEMIDE 20 MG PO TABS
40.0000 mg | ORAL_TABLET | Freq: Two times a day (BID) | ORAL | Status: DC
  Administered 2023-05-02 (×2): 40 mg via ORAL
  Filled 2023-05-02 (×2): qty 2

## 2023-05-02 NOTE — Progress Notes (Addendum)
Palliative Care Progress Note, Assessment & Plan   Patient Name: Nancy Rodriguez       Date: 05/02/2023 DOB: 1945/04/30  Age: 78 y.o. MRN#: [id number] Attending Physician: Carollee Herter, DO Primary Care Physician: Mick Sell, MD Admit Date: 04/30/2023  Subjective: Patient is sitting up in bed in no apparent distress.  She acknowledges my presence and is able to make her wishes known.  She is alert and oriented x 4.  She has no acute complaints at this time.  HPI: 78 y.o. 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    As per daughter, patient was experiencing bilateral lower extremity swelling PTA.  It was difficult to control at home.  However, her torsemide was increased to 100 mg twice daily and symptoms improved before plateauing.  1/14, patient developed sudden onset of shortness of breath and presented to the ED.   As per chart review, patient's amyloidosis involves the liver, bone marrow, and kidney (confirmed by biopsy-2022).  She underwent treatment with care aromatherapy which has been held since January 2024.   Patient is being treated for acute on chronic systolic CHF exacerbation.  Chest x-ray revealed bilateral airspace disease with suspected layering effusions.   Cardiology was consulted.  Patient is not a candidate for any further treatment for her amyloidosis and recommended a palliative care consult for goals of care, CODE STATUS, and hospice discussion.  Summary of counseling/coordination of care: Extensive chart review completed prior to meeting patient including  labs, vital signs, imaging, progress notes, orders, and available advanced directive documents from current and previous encounters.   After reviewing the patient's chart and assessing the patient at bedside, I spoke with patient in regards to symptom management and goals of care.   Symptoms assessed.  Patient denies complaints, such as nausea/vomiting, constipation, headache, chest pain, or other acute issues at this time.  No adjustment to [facility] needed.  I attempted to gauge patient's understanding of her current medical situation.  She says she knows she is going to pass but does not believe it will be soon.  She speaks of her faith in God and that the Shaune Pollack will take her when it is her time.  She shares doctors have said to her that she should prepare to pass away very quickly and that she has pulled through these extreme circumstances in the past.  I discussed seriousness of patient's heart failure and her continued decline in ejection fraction over series of echoes in the past 3 years.  I highlighted that cardiology has conveyed there is likely little benefit of aggressive GDMT titration.  I discussed patient's AL amyloidosis and her intolerance to chemotherapy once starting Dara treatments.  She shares the new treatment started to mess with her mind to take her memory.  She says she did not feel well since starting the treatments.  Counseled with patient that without treatment her amyloidosis would continue to progress.  Additionally, other crucial organs-liver and kidney- are showing signs of deterioration as well.  Patient has liver cirrhosis and creatinine that continues to increase.  Discussed the larger picture with patient.  Reviewed her functional, nutritional, and cognitive status as significant indicators of her overall poor prognosis.  Patient shares she has had hospice evaluate her in the past and that they have said they have no role in her care.  I discussed hospice services, hospice  philosophy, allowing patients to age in place with dignity and quality of life is main focus, and that she likely qualifies given her current medical state.  Additionally, I discussed the difference between hospice and palliative medicine.  Patient was appreciative of our discussion.  I attempted elicit values and goals important to the patient.  She shares she wants to be able to return home.  She says she has accepted that she will pass away.  She shares great concern that her daughter is not accepting of this fact.  Space and opportunity provided for patient to share her thoughts and emotions regarding her daughter.  Advance directives, code status, and next of kin decision maker reviewed with patient.  In the event that patient cannot speak for herself, she has named her son [name] as her next of kin decision maker.    Given the patient has 6 children, I advised her that in the absence of an HCPOA then majority of patients reasonably available children would need to come together to make a decision for her.  She shares great dislike for this fact.  She wishes to move forward with creation of advance care documentation to name her son [name] as sole decision maker for her.  Copy of advance directives given for patient to review.  Spiritual care consult placed to assist with AD completion.  DNR with full interventions, DNR with limited interventions, and full code discussed in detail.  Questions and concerns were addressed in regards to code status.  At this time, patient wishes to remain a full code.  However, I encouraged patient to consider DNR/DNI status understanding evidenced based poor outcomes in similar hospitalized patients, as the cause of the arrest is likely associated with chronic/terminal disease rather than a reversible acute cardio-pulmonary event.    I also outlined that she has shared that God will take her when she is ready-that this does not align with her current code status.  I shared  that as it stands right now, given her full code status, if the Lord tried to take her then the medical staff will attempt to keep her earthly body going.  All attempts to sustain her life on earth will be made.  She shares she is not sure about this but wants to think about it a little bit more.  Outpatient palliative services discussed.  Patient shares she is accepting of outpatient palliative services at this time.  She is not prepared to move forward with hospice at this time.  Consult placed for TOC to offer choice of outpatient palliative services at discharge.  Full code and full scope remain.  PMT contact info given to patient.  Advised patient to reach out to PMT with any palliative concerns.  PMT will continue to remain available to patient and family throughout her hospitalization.  We will step back from daily visits and monitor her peripherally.  Please reengage as appropriate.  Physical Exam Vitals reviewed.  Constitutional:      General: She is not in acute distress.    Appearance: She is normal weight.  HENT:     Head: Normocephalic.  Cardiovascular:     Rate and Rhythm: Normal rate.  Pulmonary:     Effort: Pulmonary effort is normal.  Musculoskeletal:     Cervical back: Normal range of motion.     Comments: Generalized weakness  Skin:    General: Skin is warm and dry.  Neurological:     Mental Status: She is alert and oriented to person, place, and time.  Psychiatric:        Mood and Affect: Mood normal. Mood is not anxious.        Behavior: Behavior normal. Behavior is not agitated.             Total Time 90 minutes   Time spent includes: Detailed review of medical records (labs, imaging, vital signs), medically appropriate exam (mental status, respiratory, cardiac, skin), discussed with treatment team, counseling and educating patient, family and staff, documenting clinical information, medication management and coordination of care.  Samara Deist L. Bonita Quin, DNP,  FNP-BC Palliative Medicine Team

## 2023-05-02 NOTE — Plan of Care (Signed)
  Problem: Metabolic: Goal: Ability to maintain appropriate glucose levels will improve Outcome: Progressing   Problem: Nutritional: Goal: Maintenance of adequate nutrition will improve Outcome: Progressing   Problem: Education: Goal: Knowledge of General Education information will improve Description: Including pain rating scale, medication(s)/side effects and non-pharmacologic comfort measures Outcome: Progressing   Problem: Health Behavior/Discharge Planning: Goal: Ability to manage health-related needs will improve Outcome: Progressing   Problem: Clinical Measurements: Goal: Respiratory complications will improve Outcome: Progressing Goal: Cardiovascular complication will be avoided Outcome: Progressing

## 2023-05-02 NOTE — Progress Notes (Signed)
   05/02/23 1500  Spiritual Encounters  Type of Visit Initial  Care provided to: Pt and family  Referral source Nurse (RN/NT/LPN)  Reason for visit Advance directives  OnCall Visit Yes   Chaplain visited with patient and family to discuss Advanced Directives. Patient/family is taking time to confirm if they already have documents completed at home; will notify staff when ready to proceed.  Chaplain spiritual support services remain available as the need arises.

## 2023-05-02 NOTE — Progress Notes (Signed)
Heart Failure Stewardship Pharmacy Note  PCP: Mick Sell, MD PCP-Cardiologist: Yvonne Kendall, MD  HPI: Nancy Rodriguez is a 78 y.o. female with  AL Amyloidosis s/p chemotherapy, complicated by cirrhosis and CKD Stage 3b, HFrEF, type 2 diabetes, hypertension, hyperlipidemia  who presented with progressive worsening shortness of breath/tachypnea over several days and acute worsening 04/30/23 prompting presentation to the ED. Acute onset shortness of breath was accompanied by marked hypertension (186/90 mmHg). CT scan on admission showed bialteral multilobular pneumonia and bilateral small pleural effusions. BNP on admission was elevated at 2191. HS-troponin on admission was 68, trending to 210. Patient and daughter report excellent functional status ~2 weeks ago, able to cook, clean, and perform ADLs.  Patient has a history of MGUS since 2009 and was followed regularly. In December 2021 patient was noted to have elevated AST, ALT and ALK and underwent ultrasound guided liver biopsy on 05/11/2020, which showed diffuse amyloidosis. A bone marrow biopsy on 06/09/20 showed 8% monoclonal plasma cells. Patient was started on Dara-CyBord. On 11/01/20 her chemotherapy was changed to Dara-Rev-Dex. She tolerated the chemotherapy quite well. Chemotherapy held in 2023. In 04/2022 was started back on dara chemotherapy. She indeed received weekly dara treatment for a few weeks, but then the treatment was held due to the need of weekly treatment and patient not feeling well. Patient is not currently on chemotherapy.   Pertinent cardiac history: Cardiac MRI in 2022 showed LVEF of 71% with upper septal thickening. Echo 06/2021 showed low normal systolic function of 50 to 55%. Echo 03/2023 showed LVEF of 25% with grade I diastolic dysfunction. Patient seen by Dr. Allena Katz with [facility] who noted that there is likely little benefit of MRI or aggressive GDMT titration.  Pertinent Lab Values: Creatinine  Date  Value Ref Range Status  12/28/2022 0.98 0.44 - 1.00 mg/dL Final   Creatinine, Ser  Date Value Ref Range Status  05/02/2023 1.69 (H) 0.44 - 1.00 mg/dL Final   BUN  Date Value Ref Range Status  05/02/2023 44 (H) 8 - 23 mg/dL Final   Potassium  Date Value Ref Range Status  05/02/2023 4.1 3.5 - 5.1 mmol/L Final   Sodium  Date Value Ref Range Status  05/02/2023 138 135 - 145 mmol/L Final   B Natriuretic Peptide  Date Value Ref Range Status  04/30/2023 2,191.0 (H) 0.0 - 100.0 pg/mL Final    Comment:    Performed at [facility], [address] [place], [place] [address]   Magnesium  Date Value Ref Range Status  05/02/2023 2.0 1.7 - 2.4 mg/dL Final    Comment:    Performed at [facility], [address] [place], [place] [address]   Hgb A1c MFr Bld  Date Value Ref Range Status  09/27/2021 6.4 (H) 4.8 - 5.6 % Final    Comment:    (NOTE) Pre diabetes:          5.7%-6.4%  Diabetes:              >6.4%  Glycemic control for   <7.0% adults with diabetes    TSH  Date Value Ref Range Status  05/20/2020 6.489 (H) 0.350 - 4.500 uIU/mL Final    Comment:    Performed by a 3rd Generation assay with a functional sensitivity of <=0.01 uIU/mL. Performed at [facility], [address] [place], [place] [address]    LDH  Date Value Ref Range Status  07/19/2022 127 98 - 192 U/L Final  Comment:    Performed at [facility], [address] [place], [place] [address]    Vital Signs:  Temp:  [97.5 F (36.4 C)-98.5 F (36.9 C)] 97.8 F (36.6 C) (01/16 0355) Pulse Rate:  [101-106] 101 (01/16 0355) Cardiac Rhythm: Normal sinus rhythm (01/16 0700) Resp:  [14-22] 18 (01/16 0355) BP: (126-155)/(80-93) 137/93 (01/16 0355) SpO2:  [90 %-100 %] 100 % (01/16 0355) Weight:  [64.1 kg (141 lb 5 oz)] 64.1 kg (141 lb 5 oz) (01/16 0503)  Intake/Output Summary (Last 24 hours) at 05/02/2023 0746 Last data filed at 05/02/2023 6045 Gross per 24 hour   Intake 240 ml  Output 1450 ml  Net -1210 ml   Current Heart Failure Medications:  Loop diuretic: none Beta-Blocker: metoprolol succinate 50 mg daily ACEI/ARB/ARNI: none MRA: spironolactone 100 mg daily SGLT2i: none Other: none  Prior to admission Heart Failure Medications:  Loop diuretic: torsemide 40 mg daily (self-decreased from 100 mg BID) Beta-Blocker: none ACEI/ARB/ARNI: none (allergy) MRA: spironolactone 200 mg daily SGLT2i: none Other:hydralazine 25 mg TID, amlodipine 5 mg daily  Assessment: 1. 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 NYHA class IV symptoms.  -Symptoms: Reports improvement in shortness of breath and LEE. Reported some depression last night, but appreciated the support of nursing staff. Appetite today is much better. -Volume: Patient appears euvolemic on exam. LEE is much improved. -Hemodynamics: BP remains elevated. Tachycardic on telemetry in room. -BB: metoprolol succinate 50 mg daily restarted.  -ACEI/ARB/ARNI: Contraindicated due to history of anaphylaxis to lisinopril. Given age, would not rechallenge with ARB. -MRA: Patient is on high dose spironolactone at home. Can consider resuming at reduced dose pending renal function. -SGLT2i: Patient may be able to start SGLT2i after external catheter is removed. Unsure the degree of benefit given age/ disease etiology and warrants patient provider discussion. No history of UTI. -Appears patient has never been diagnosed with cardiac involvement of AL amyloidosis. HF physician outpatient does not believe further work-up would be of benefit given limited therapeutic options. -Recommend stopping amlodipine at discharge as it can worsen LEE and has no CV benefit for CHF patients. Can instead consolidate to BiDil.  Plan: 1) Medication changes recommended at this time: -If goal is full scope of treatment, could consider  resuming hydralazine 25 mg TID along with isosorbide. Given high incidence of autonomic dysfunction, will need to monitor closely.  2) Patient assistance: -Pending  3) Education: - Patient has been educated on current HF medications and potential additions to HF medication regimen - Patient verbalizes understanding that over the next few months, these medication doses may change and more medications may be added to optimize HF regimen - Patient has been educated on basic disease state pathophysiology and goals of therapy  Medication Assistance / Insurance Benefits Check: Does the patient have prescription insurance?    Type of insurance plan:  Does the patient qualify for medication assistance through manufacturers or grants? Pending  Outpatient Pharmacy: Prior to admission outpatient pharmacy: [facility]     Please do not hesitate to reach out with questions or concerns,  Enos Fling, PharmD, CPP, BCPS Heart Failure Pharmacist  Phone - [phone] 05/02/2023 7:46 AM

## 2023-05-02 NOTE — TOC Initial Note (Signed)
Transition of Care [facility]) - Initial/Assessment Note    Patient Details  Name: Nancy Rodriguez MRN: [id number] Date of Birth: 16-Oct-1945  Transition of Care [facility]) CM/SW Contact:    Margarito Liner, LCSW Phone Number: 05/02/2023, 4:10 PM  Clinical Narrative:  Patient asleep and did not wake up to conversation. CSW met with daughter [name] at bedside. CSW introduced role and explained that therapy recommendations would be discussed. Daughter is agreeable to home health but will discuss with patient. TOC team will follow up with decision.                Expected Discharge Plan: Home w Home Health Services Barriers to Discharge: Continued Medical Work up   Patient Goals and CMS Choice   CMS Medicare.gov Compare Post Acute Care list provided to:: Patient Represenative (must comment) (Daughter)        Expected Discharge Plan and Services       Living arrangements for the past 2 months: Single Family Home                                      Prior Living Arrangements/Services Living arrangements for the past 2 months: Single Family Home Lives with:: Adult Children Patient language and need for interpreter reviewed:: Yes Do you feel safe going back to the place where you live?: Yes      Need for Family Participation in Patient Care: Yes (Comment) Care giver support system in place?: Yes (comment)   Criminal Activity/Legal Involvement Pertinent to Current Situation/Hospitalization: No - Comment as needed  Activities of Daily Living   ADL Screening (condition at time of admission) Independently performs ADLs?: No Does the patient have a NEW difficulty with bathing/dressing/toileting/self-feeding that is expected to last >3 days?: Yes (Initiates electronic notice to provider for possible OT consult) Does the patient have a NEW difficulty with getting in/out of bed, walking, or climbing stairs that is expected to last >3 days?: Yes (Initiates electronic notice to provider for  possible PT consult) Does the patient have a NEW difficulty with communication that is expected to last >3 days?: No Is the patient deaf or have difficulty hearing?: No Does the patient have difficulty seeing, even when wearing glasses/contacts?: No Does the patient have difficulty concentrating, remembering, or making decisions?: No  Permission Sought/Granted                  Emotional Assessment Appearance:: Appears stated age Attitude/Demeanor/Rapport: Unable to Assess Affect (typically observed): Unable to Assess Orientation: : Oriented to Self, Oriented to Place, Oriented to  Time, Oriented to Situation Alcohol / Substance Use: Not Applicable Psych Involvement: No (comment)  Admission diagnosis:  Acute respiratory failure with hypoxia and hypercapnia (HCC) [J96.01, J96.02] Acute on chronic HFrEF (heart failure with reduced ejection fraction) (HCC) [I50.23] Acute on chronic congestive heart failure, unspecified heart failure type [facility]) [I50.9] Patient Active Problem List   Diagnosis Date Noted   Acute on chronic congestive heart failure (HCC) 05/02/2023   Acute on chronic HFrEF (heart failure with reduced ejection fraction) (HCC) 04/30/2023   Insulin dependent type 2 diabetes mellitus (HCC) 04/30/2023   Acute respiratory failure with hypoxia and hypercapnia (HCC) 04/30/2023   Demand ischemia (HCC) 04/30/2023   Swelling of lower extremity 06/28/2022   Diarrhea 06/21/2022   Ascites 05/11/2022   Portal hypertension (HCC) 03/27/2022   Aortic atherosclerosis (HCC) 03/05/2022   Palliative care encounter  Elevated bilirubin 10/19/2021   Malnutrition of moderate degree 09/28/2021   AKI (acute kidney injury) (HCC) 09/28/2021   Acute postoperative anemia due to expected blood loss 09/27/2021   Delirium due to multiple etiologies, persistent, hypoactive 09/27/2021   Hypertensive urgency 09/26/2021   Diabetes mellitus without complication (HCC)    Chronic kidney disease, stage  3b (HCC) - baseline Scr 1.2-1.5    Fracture of femoral shaft, right, closed (HCC)    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   Subdural hematoma (HCC) 05/18/2021   Hypoalbuminemia 04/27/2021   Vaginal bleeding 04/27/2021   Hepatic cirrhosis (HCC) 04/27/2021   Hyperbilirubinemia 04/27/2021   IDA (iron deficiency anemia) 02/16/2021   Gastroesophageal reflux disease 11/10/2020   Lower extremity edema 10/14/2020   Elevated LFTs 10/12/2020   Loss of weight 10/12/2020   Anasarca 09/04/2020   Edema 08/13/2020   Multiple myeloma (HCC) 08/13/2020   Chemotherapy induced nausea and vomiting 07/27/2020   Hypokalemia 07/27/2020   Goals of care, counseling/discussion 07/20/2020   Microcytic anemia 07/06/2020   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HCC) 07/06/2020   Abnormal EKG 07/01/2020   Essential hypertension 07/01/2020   Hyperlipidemia associated with type 2 diabetes mellitus (HCC) 07/01/2020   Encounter for antineoplastic chemotherapy 06/17/2020   Transaminitis 06/17/2020   Hyperglycemia 06/17/2020   Amyloidosis (HCC) 05/20/2020   Moderate episode of recurrent major depressive disorder (HCC) 06/12/2016   H/O parathyroidectomy 06/21/2011   Obstructive sleep apnea (adult) (pediatric) 06/21/2011   PCP:  Mick Sell, MD Pharmacy:   [facility] [address], [place] - [address] [place] [address] Phone: [phone] Fax: [phone]     Social Drivers of Health (SDOH) Social History: SDOH Screenings   Food Insecurity: No Food Insecurity (05/01/2023)  Housing: Low Risk  (05/01/2023)  Transportation Needs: No Transportation Needs (05/01/2023)  Utilities: Not At Risk (05/01/2023)  Depression (PHQ2-9): Low Risk  (09/07/2019)  Financial Resource Strain: Low Risk  (03/29/2023)   Received from [facility]  Social Connections: Moderately Integrated (05/01/2023)  Tobacco Use: Low Risk  (04/30/2023)   SDOH  Interventions:     Readmission Risk Interventions     No data to display

## 2023-05-02 NOTE — Progress Notes (Signed)
PROGRESS NOTE    Nancy FLORIANO  RJJ:[id number] DOB: Mar 20, 1946 DOA: 04/30/2023 PCP: Mick Sell, MD  Subjective: Pt seen and examined. No family at bedside Breathing is improved. LE edema is much improved.  Palliative care consult reviewed and appreciated. Pt has not made any decisions yet.   Hospital Course: HPI: Nancy Rodriguez is a 78 y.o. 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   History obtained from patient's daughter at bedside due to patient's sleepiness.  She states that patient has been experiencing bilateral lower extremity swelling that has been difficult to control for the last several weeks, however since her torsemide was increased to 100 mg twice daily, symptoms improved before plateauing.  Then today, she developed sudden onset shortness of breath.  Nancy Rodriguez did not mention any chest pain or palpitations.  No recent illness, fever, chills, nausea, vomiting, diarrhea.  Significant Events: Admitted 04/30/2023 acute on chronic systolic CHF exacerbation   Significant Labs: VBG with pH of 7.17 and pCO2 of 62  WBC of 12.9, platelets of 470, bicarb 21, glucose 154, BUN 36, creatinine 1.44, alkaline phosphatase 350, and GFR of 37. BNP 2191 with troponin of 68. Lactic acid 2.0.   Significant Imaging Studies: Chest x-ray was obtained that demonstrated bilateral airspace disease with suspected layering effusions   Antibiotic Therapy: Anti-infectives (From admission, onward)    Start     Dose/Rate Route Frequency Ordered Stop   04/30/23 2200  acyclovir (ZOVIRAX) 200 MG capsule 400 mg        400 mg Oral 2 times daily 04/30/23 2159     04/30/23 1145  cefTRIAXone (ROCEPHIN) 2 g in sodium chloride 0.9 % 100 mL IVPB        2 g 200 mL/hr over 30  Minutes Intravenous Once 04/30/23 1132 04/30/23 1243   04/30/23 1145  azithromycin (ZITHROMAX) 500 mg in sodium chloride 0.9 % 250 mL IVPB        500 mg 250 mL/hr over 60 Minutes Intravenous  Once 04/30/23 1132 04/30/23 1347       Procedures:   Consultants: Cardiology Palliative care    Assessment and Plan: * Acute on chronic HFrEF (heart failure with reduced ejection fraction) (HCC) On Admission, Per chart review, patient has a long-term history of HFpEF and recently established with [facility] cardiology.  Echocardiogram obtained at that time demonstrated significant decompensation in LVEF to 25%.  At recent visit with nephrology, her torsemide was increased to 100 mg twice daily.  Despite this, patient had sudden onset shortness of breath today with elevated BNP.  Diuresis will be complicated by her CKD and cirrhosis with generalized anasarca. Cardiology consulted. Palliative care consulted. Pt placed on lasix 100 mg IV q12. 05-01-2023 remains on lasix 100 mg IV q12h. Pt not a candidate for any further treatment for her amyloidosis. Awaiting palliative care consult for GOC/code status discussion. Remains on aldactone 100 mg daily and Toprol-XL 50 mg daily.  05-02-2023 cards has seen patient and stopped IV lasix. Changed to po demadex 40 mg bid. Remains on aldactone 100 mg daily, troprol-xl 50 mg daily. Weight is down 154 lbs on admission. Currently 141 lbs.     Acute respiratory failure with hypoxia and hypercapnia (HCC) On admission, Patient presenting with severe shortness of breath and hypoxic and hypercarbic respiratory  failure suspected to be due to underlying heart disease and anasarca.  Low suspicion for infection due to lack of other symptoms such as fever, rhinorrhea, cough, etc. Continue BiPAP as needed. CT chest shows small bilateral pleural effusions. Was started on IV abx.  05-01-2023 my index of suspicion for pneumonia is low. Procal never done yesterday. Will order one now.  If procal negative, will stop IV abx. Effusion look too small for thoracentesis.   05-02-2023 procal returned yesterday at 5.37. which is surprising to me. Repeat CXR today shows dramatic improvement in aeration and near resolution of her pulmonary edema. Have restarted IV rocephin due to elevated procalcitonin but still not clear if pt has true pneumonia or not.  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HCC) 05-01-2023 likely the cause of her ascites. No indication for paracentesis.  05-02-2023 stable. No indication for paracentesis.  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  She underwent treatment with chemotherapy, which has been held since January 2024. 05-01-2023 per cards, nothing more can be done for her cardiac amyloidosis. Awaiting palliative care consult for GOC/code status/hospice discussion.  05-02-2023 pt met with palliative care team yesterday. No decisions have been made yet.  Demand ischemia (HCC) On admission, Suspect demand in the setting of acute hypoxic and hypercarbic respiratory failure.  No reported chest pain. Cardiology agrees that this is not ACS but demand ischemia from her CHF.  05-01-2023 stable. No chest pain.  Insulin dependent type 2 diabetes mellitus (HCC) On admission, Hold home regimen. Placed on SSI, moderate.  Semglee 5 units at bedtime 05-01-2023 CBG range is acceptable. Continue with lantus 5 every day and SSI.  05-02-2023 CBG is acceptable ranges.  Ascites 05-01-2023 likely due to amyloid liver.. No indication for paracentesis at this point.  05-02-2023 stable. No indication for paracentesis.  Chronic kidney disease, stage 3b (HCC) - baseline Scr 1.2-1.5 On admission, Per chart review, baseline creatinine between 1.2 and 1.5 for the last > 3 months. Currently at baseline. Continue to monitor closely while diuresing. Strict in and out.   Daily weights.  Avoid other nephrotoxic agents as able 05-01-2023 monitor Scr. SCR today 1.64  05-02-2023 scr stable at 1.69 today. BUN 44. Cards has change diuretics over to po demadex 40 mg bid.  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  05-01-2023 bedside abd U/S shows moderate abd ascites. Pt's abd is not tense. Currently no indication for paracentesis. Continue with lasix and aldactone.  05-02-2023 due to amyloid liver. No indication for paracentesis.  Essential hypertension On admission, Significantly hypertensive on initial arrival with rapid improvement after BiPAP. Continue home regimen 05-01-2023 continue with aldactone 100 mg qday, lasix 100 mg IV bid, toprol-xl 50 mg qday. ARB on hold due to aggressive diuresis plan.  05-02-2023 remains on aldactone 100 mg qday, toprol-xl 50 mg qday. Cards changed diuretics over to demadex 40 mg bid.  ARB on hold due to aggressive diuresis plan.   DVT prophylaxis: enoxaparin (LOVENOX) injection 30 mg Start: 04/30/23 2200    Code Status: Full Code Family Communication: no family at bedside Disposition Plan: Home with [facility] PT/OT Reason for continuing need for hospitalization: continues with diuresis, monitoring Scr, on IV ABX.  Objective: Vitals:   05/02/23 0503 05/02/23 0851 05/02/23 0852 05/02/23 1138  BP:  (!) 137/94 (!) 137/94 (!) 138/93  Pulse:  97 97 95  Resp:      Temp:  98 F (36.7 C)  98.1 F (36.7 C)  TempSrc:      SpO2:  96%  94%  Weight: 64.1 kg     Height:        Intake/Output Summary (Last 24 hours) at 05/02/2023 1333 Last data filed at 05/02/2023 1041 Gross per 24 hour  Intake 483 ml  Output 1450 ml  Net -967 ml   Filed Weights   04/30/23 1501 05/02/23 0503  Weight: 69.9 kg 64.1 kg    Examination:  Physical Exam Vitals and nursing note reviewed.  Constitutional:      General: She is not in acute  distress.    Appearance: She is not toxic-appearing or diaphoretic.  HENT:     Head: Normocephalic and atraumatic.  Eyes:     General: No scleral icterus. Cardiovascular:     Rate and Rhythm: Normal rate and regular rhythm.     Heart sounds: Murmur heard.  Pulmonary:     Effort: Pulmonary effort is normal. No respiratory distress.     Breath sounds: Normal breath sounds. No wheezing.  Abdominal:     General: Bowel sounds are normal.     Palpations: Abdomen is soft. There is fluid wave.     Tenderness: There is no abdominal tenderness.  Musculoskeletal:     Right lower leg: No edema.     Left lower leg: No edema.  Skin:    General: Skin is warm and dry.     Capillary Refill: Capillary refill takes less than 2 seconds.  Neurological:     Mental Status: She is alert and oriented to person, place, and time.     Data Reviewed: I have personally reviewed following labs and imaging studies  CBC: Recent Labs  Lab 04/30/23 0814 05/01/23 0531  WBC 12.9* 9.4  NEUTROABS 6.7 6.6  HGB 12.8 9.9*  HCT 38.6 29.1*  MCV 77.5* 77.0*  PLT 470* 338   Basic Metabolic Panel: Recent Labs  Lab 04/30/23 0814 04/30/23 1532 05/01/23 0531 05/02/23 0437  NA 140  --  140 138  K 4.5  --  4.2 4.1  CL 105  --  108 104  CO2 21*  --  21* 21*  GLUCOSE 154*  --  119* 128*  BUN 36*  --  42* 44*  CREATININE 1.44*  --  1.64* 1.69*  CALCIUM 9.2  --  8.4* 8.9  MG  --  1.6* 2.3 2.0  PHOS  --   --  4.3 3.9   GFR: Estimated Creatinine Clearance: 23.7 mL/min (A) (by C-G formula based on SCr of 1.69 mg/dL (H)). Liver Function Tests: Recent Labs  Lab 04/30/23 0814  AST 37  ALT 21  ALKPHOS 350*  BILITOT 1.3*  PROT 7.2  ALBUMIN 3.0*   BNP (last 3 results) Recent Labs    04/30/23 0814  BNP 2,191.0*   CBG: Recent Labs  Lab 05/01/23 1607 05/01/23 2039 05/02/23 0359 05/02/23 0846 05/02/23 1146  GLUCAP 206* 149* 142* 107* 167*   Sepsis Labs: Recent Labs  Lab 04/30/23 0951  04/30/23 1203 05/01/23 1215  PROCALCITON  --   --  5.37  LATICACIDVEN 2.0* 1.2  --     Recent Results (from the past 240 hours)  Blood Culture (routine x 2)     Status: None (Preliminary result)   Collection Time: 04/30/23  8:13 AM   Specimen: BLOOD  Result Value Ref  Range Status   Specimen Description BLOOD RAC  Final   Special Requests   Final    BOTTLES DRAWN AEROBIC AND ANAEROBIC Blood Culture results may not be optimal due to an inadequate volume of blood received in culture bottles   Culture   Final    NO GROWTH 2 DAYS Performed at [facility], [address] [place], [place] [address]    Report Status PENDING  Incomplete  Blood Culture (routine x 2)     Status: None (Preliminary result)   Collection Time: 04/30/23  9:52 AM   Specimen: BLOOD RIGHT HAND  Result Value Ref Range Status   Specimen Description BLOOD RIGHT HAND  Final   Special Requests   Final    BOTTLES DRAWN AEROBIC ONLY Blood Culture results may not be optimal due to an inadequate volume of blood received in culture bottles   Culture   Final    NO GROWTH 2 DAYS Performed at [facility], [address] [place], [place] [address]    Report Status PENDING  Incomplete     Radiology Studies: DG Chest Port 1 View Result Date: 05/02/2023 CLINICAL DATA:  [id number] Acute respiratory failure with hypoxia (HCC) [id number] EXAM: PORTABLE CHEST 1 VIEW COMPARISON:  04/30/2023 FINDINGS: Stable heart size. Low lung volumes. Significant improvement in the aeration of both lungs with mild interstitial prominence remaining. Probable trace left pleural effusion. No pneumothorax. IMPRESSION: Significant improvement in the aeration of both lungs. Electronically Signed   By: Duanne Guess D.O.   On: 05/02/2023 11:17   CT CHEST WO CONTRAST Result Date: 04/30/2023 CLINICAL DATA:  Pleural effusion, known or suspected (Ped 0-17y). Shortness of breath. EXAM: CT CHEST WITHOUT CONTRAST TECHNIQUE: Multidetector CT  imaging of the chest was performed following the standard protocol without IV contrast. RADIATION DOSE REDUCTION: This exam was performed according to the departmental dose-optimization program which includes automated exposure control, adjustment of the mA and/or kV according to patient size and/or use of iterative reconstruction technique. COMPARISON:  None Available. FINDINGS: Cardiovascular: Normal cardiac size. No pericardial effusion. No aortic aneurysm. There are coronary artery calcifications, in keeping with coronary artery disease. There are also mild-to-moderate peripheral atherosclerotic vascular calcifications of thoracic aorta and its major branches. There is dilation of the main pulmonary trunk measuring up to 2.9 cm, which is nonspecific but can be seen with pulmonary artery hypertension. Mediastinum/Nodes: Visualized thyroid gland appears grossly unremarkable. No solid / cystic mediastinal masses. The esophagus is nondistended precluding optimal assessment. No mediastinal or axillary lymphadenopathy by size criteria. Evaluation of bilateral hila is limited due to lack on intravenous contrast: however, no large hilar lymphadenopathy identified. Lungs/Pleura: The central tracheo-bronchial tree is patent. There are heterogeneous opacities throughout bilateral lungs, compatible with multilobar pneumonia. There also bilateral small pleural effusions. There superimposed atelectatic changes in the left lower lobe as well. No pneumothorax or suspicious lung mass. Upper Abdomen: There is a subcapsular 1.3 x 1.8 cm hypoattenuating structure in the left hepatic dome, not well evaluated on the current exam but characterized as a cyst on the prior exam. There is at least moderate ascites. There is a small sliding hiatal hernia. Remaining visualized upper abdominal viscera within normal limits. Musculoskeletal: The visualized soft tissues of the chest wall are grossly unremarkable. No suspicious osseous lesions.  There are mild multilevel degenerative changes in the visualized spine. IMPRESSION: 1. Bilateral multilobar pneumonia and bilateral small pleural effusions. Follow-up to clearing is recommended. 2. Multiple other nonacute observations, as described  above. Aortic Atherosclerosis (ICD10-I70.0). Electronically Signed   By: Jules Schick M.D.   On: 04/30/2023 15:39    Scheduled Meds:  acyclovir  400 mg Oral BID   enoxaparin (LOVENOX) injection  30 mg Subcutaneous Q24H   ferrous sulfate  325 mg Oral Q breakfast   gabapentin  100 mg Oral BID   insulin aspart  0-15 Units Subcutaneous TID WC   insulin glargine-yfgn  5 Units Subcutaneous QHS   metoprolol succinate  50 mg Oral Daily   pantoprazole  40 mg Oral Daily   sertraline  50 mg Oral Daily   sodium chloride flush  3 mL Intravenous Q12H   spironolactone  100 mg Oral Daily   torsemide  40 mg Oral BID   Continuous Infusions:  cefTRIAXone (ROCEPHIN)  IV 1 g (05/02/23 0735)     LOS: 2 days   Time spent: 40 minutes  Carollee Herter, DO  [facility]  05/02/2023, 1:33 PM

## 2023-05-02 NOTE — Telephone Encounter (Signed)
Patient Product/process development scientist completed.    The patient is insured through [employer]. Patient has Medicare and is not eligible for a copay card, but may be able to apply for patient assistance or Medicare RX Payment Plan (Patient Must reach out to their plan, if eligible for payment plan), if available.    Ran test claim for Entresto 24-26 mg and the current 30 day co-pay is $24.19.  Ran test claim for Farxiga 10 mg and the current 30 day co-pay is $24.19.  Ran test claim for Jardiance 10 mg and the current 30 day co-pay is $24.19.  This test claim was processed through [facility]- copay amounts may vary at other pharmacies due to pharmacy/plan contracts, or as the patient moves through the different stages of their insurance plan.     Nancy Rodriguez, CPHT Pharmacy Technician III Certified Patient Advocate [facility] Patient Advocate Team Direct Number: [phone]  Fax: [phone]

## 2023-05-02 NOTE — Progress Notes (Signed)
[facility] CLINIC CARDIOLOGY PROGRESS NOTE       Patient ID: CAYLEEN YOUN MRN: [id number] DOB/AGE: [birth year] 77 y.o.  Admit date: 04/30/2023 Referring Physician Dr. Verdene Lennert  Primary Physician Sampson Goon Stann Mainland, MD  Primary Cardiologist Dr. Corky Sing Reason for Consultation AoCHF  HPI: ALLICE SHIU is a 78 y.o. 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 Cardiology was consulted for further evaluation.   Interval history: -Patient reports she is feeling better overall today.  -SOB is improved, denies any chest pain or palpitations.  -LE edema resolved, she feels abdominal distention is improved.  -BP and HR remain stable.   Review of systems complete and found to be negative unless listed above    Past Medical History:  Diagnosis Date   (HFpEF) heart failure with preserved ejection fraction (HCC)    a. 05/2020 Echo: EF 60-65%; b. 08/2020 cMRI ([facility]): EF 71%, no delayed hyperenhancement to suggest scar/infiltrative dzs. Nl RV fxn. Mild BAE. Triv MR, mild TR; c. 06/2021 Echo: EF 50-55%, no rwma, mild basal-septal LVH, GrII DD. Nl RV size/fxn. Mild MR. Ao sclerosis.   AL amyloidosis (HCC)    Anemia due to chronic kidney disease    Anemia in chronic kidney disease   CKD (chronic kidney disease), stage III (HCC)    Diabetes mellitus without complication (HCC)    History of cardiovascular stress test    a. 03/2012 Stress Echo([facility]): nl study.   Hypercholesterolemia    Hypertension    MGUS (monoclonal gammopathy of unknown significance)    Osteoarthritis    Rheumatoid arthritis (HCC)     Past Surgical History:  Procedure Laterality Date   HERNIA REPAIR     INTRAMEDULLARY (IM) NAIL INTERTROCHANTERIC Right 09/26/2021   Procedure: INTRAMEDULLARY (IM) NAIL INTERTROCHANTRIC;  Surgeon:  Juanell Fairly, MD;  Location: [facility] ORS;  Service: Orthopedics;  Laterality: Right;   PARATHYROIDECTOMY      Medications Prior to Admission  Medication Sig Dispense Refill Last Dose/Taking   acyclovir (ZOVIRAX) 400 MG tablet Take 1 tablet (400 mg total) by mouth 2 (two) times daily. 60 tablet 2 04/29/2023 Morning   albuterol (VENTOLIN HFA) 108 (90 Base) MCG/ACT inhaler Inhale 2 puffs into the lungs every 4 (four) hours as needed for wheezing or shortness of breath.   Taking As Needed   amLODipine (NORVASC) 5 MG tablet Take 5 mg by mouth daily.   Past Week   ascorbic acid (VITAMIN C) 500 MG tablet Take 500 mg by mouth daily.   04/29/2023   bismuth subsalicylate (PEPTO BISMOL) 262 MG/15ML suspension Take 30 mLs by mouth every 6 (six) hours as needed for indigestion or diarrhea or loose stools.   Taking As Needed   EPINEPHrine 0.3 mg/0.3 mL IJ SOAJ injection Inject 0.3 mg into the muscle as needed.   Taking As Needed   ferrous sulfate 325 (65 FE) MG tablet TAKE 1 TABLET BY MOUTH EVERY DAY 60 tablet 0 Past Week   gabapentin (NEURONTIN) 100 MG capsule Take 100 mg by mouth 2 (two) times daily.   04/29/2023 Morning   HUMALOG KWIKPEN 100 UNIT/ML KwikPen Inject 0-8 Units into the skin 3 (three) times daily. Per sliding scale.   04/29/2023 Morning   hydrocortisone (ANUSOL-HC) 25 MG suppository Place 25 mg rectally 2 (two) times daily.   Taking   LANTUS  SOLOSTAR 100 UNIT/ML Solostar Pen Inject 7 Units into the skin daily.   04/29/2023   loperamide (IMODIUM) 2 MG capsule Take 1 capsule (2 mg total) by mouth See admin instructions. Initial: 4 mg, followed by 2 mg after each loose stool, maximum 16 mg within 24 hours. 60 capsule 0 Taking   losartan (COZAAR) 25 MG tablet Take 25 mg by mouth daily.   Taking   omeprazole (PRILOSEC) 20 MG capsule Take 20 mg by mouth 2 (two) times daily.   04/29/2023 Morning   potassium chloride SA (KLOR-CON M) 20 MEQ tablet Take 20 mEq by mouth daily.   Past Month   sertraline  (ZOLOFT) 25 MG tablet Take 50 mg by mouth daily.   04/29/2023   spironolactone (ALDACTONE) 100 MG tablet Take 100 mg by mouth 2 (two) times daily.   04/29/2023   torsemide (DEMADEX) 100 MG tablet Take 100 mg by mouth daily.   Past Week   traMADol (ULTRAM) 50 MG tablet Take 1 tablet (50 mg total) by mouth every 12 (twelve) hours as needed for moderate pain. 10 tablet 0 Taking As Needed   bumetanide (BUMEX) 2 MG tablet Take 2 mg by mouth daily. (Patient not taking: Reported on 04/30/2023)   Not Taking   guaiFENesin-codeine 100-10 MG/5ML syrup Take by mouth. (Patient not taking: Reported on 10/25/2022)   Not Taking   hydrALAZINE (APRESOLINE) 25 MG tablet Take 1 tablet by mouth 3 (three) times daily. (Patient not taking: Reported on 04/30/2023)   Not Taking   metoprolol succinate (TOPROL-XL) 50 MG 24 hr tablet Take 50 mg by mouth daily.      polyethylene glycol (MIRALAX / GLYCOLAX) 17 g packet Take 17 g by mouth daily as needed for mild constipation. (Patient not taking: Reported on 04/30/2023) 14 each 0 Not Taking   torsemide (DEMADEX) 20 MG tablet Take 20 mg by mouth daily as needed. (Patient not taking: Reported on 04/30/2023)   Not Taking   Social History   Socioeconomic History   Marital status: Widowed    Spouse name: Not on file   Number of children: 6   Years of education: Not on file   Highest education level: Not on file  Occupational History   Not on file  Tobacco Use   Smoking status: Never   Smokeless tobacco: Never  Vaping Use   Vaping status: Never Used  Substance and Sexual Activity   Alcohol use: No   Drug use: Never   Sexual activity: Not on file  Other Topics Concern   Not on file  Social History Narrative   Not on file   Social Drivers of Health   Financial Resource Strain: Low Risk  (03/29/2023)   Received from [facility]   Overall Financial Resource Strain (CARDIA)    Difficulty of Paying Living Expenses: Not hard at all  Food Insecurity: No Food  Insecurity (05/01/2023)   Hunger Vital Sign    Worried About Running Out of Food in the Last Year: Never true    Ran Out of Food in the Last Year: Never true  Transportation Needs: No Transportation Needs (05/01/2023)   PRAPARE - Administrator, Civil Service (Medical): No    Lack of Transportation (Non-Medical): No  Physical Activity: Not on file  Stress: Not on file  Social Connections: Moderately Integrated (05/01/2023)   Social Connection and Isolation Panel [NHANES]    Frequency of Communication with Friends and Family: More than three times  a week    Frequency of Social Gatherings with Friends and Family: More than three times a week    Attends Religious Services: More than 4 times per year    Active Member of Clubs or Organizations: Yes    Attends Banker Meetings: More than 4 times per year    Marital Status: Widowed  Intimate Partner Violence: Not At Risk (05/01/2023)   Humiliation, Afraid, Rape, and Kick questionnaire    Fear of Current or Ex-Partner: No    Emotionally Abused: No    Physically Abused: No    Sexually Abused: No    Family History  Problem Relation Age of Onset   Diabetes Daughter    Diabetes Son    Dementia Mother    Cancer Father    Esophageal cancer Sister    Brain cancer Brother      Vitals:   05/02/23 0503 05/02/23 0851 05/02/23 0852 05/02/23 1138  BP:  (!) 137/94 (!) 137/94 (!) 138/93  Pulse:  97 97 95  Resp:      Temp:  98 F (36.7 C)  98.1 F (36.7 C)  TempSrc:      SpO2:  96%  94%  Weight: 64.1 kg     Height:        PHYSICAL EXAM General: Chronically ill appearing elderly female, well nourished, in no acute distress. HEENT: Normocephalic and atraumatic. Neck: No JVD.  Lungs: Normal respiratory effort on 1L [place].  Bibasilar crackles.  Heart: HRRR. Normal S1 and S2 without gallops or murmurs.  Abdomen: Non-distended appearing.  Msk: Normal strength and tone for age. Extremities: Warm and well perfused. No  clubbing, cyanosis.  Trace edema.  Neuro: Alert and oriented X 3. Psych: Answers questions appropriately.   Labs: Basic Metabolic Panel: Recent Labs    05/01/23 0531 05/02/23 0437  NA 140 138  K 4.2 4.1  CL 108 104  CO2 21* 21*  GLUCOSE 119* 128*  BUN 42* 44*  CREATININE 1.64* 1.69*  CALCIUM 8.4* 8.9  MG 2.3 2.0  PHOS 4.3 3.9   Liver Function Tests: Recent Labs    04/30/23 0814  AST 37  ALT 21  ALKPHOS 350*  BILITOT 1.3*  PROT 7.2  ALBUMIN 3.0*   No results for input(s): "LIPASE", "AMYLASE" in the last 72 hours. CBC: Recent Labs    04/30/23 0814 05/01/23 0531  WBC 12.9* 9.4  NEUTROABS 6.7 6.6  HGB 12.8 9.9*  HCT 38.6 29.1*  MCV 77.5* 77.0*  PLT 470* 338   Cardiac Enzymes: Recent Labs    04/30/23 0814 04/30/23 1203 04/30/23 1532  TROPONINIHS 68* 210* 211*   BNP: Recent Labs    04/30/23 0814  BNP 2,191.0*   D-Dimer: No results for input(s): "DDIMER" in the last 72 hours. Hemoglobin A1C: No results for input(s): "HGBA1C" in the last 72 hours. Fasting Lipid Panel: No results for input(s): "CHOL", "HDL", "LDLCALC", "TRIG", "CHOLHDL", "LDLDIRECT" in the last 72 hours. Thyroid Function Tests: No results for input(s): "TSH", "T4TOTAL", "T3FREE", "THYROIDAB" in the last 72 hours.  Invalid input(s): "FREET3" Anemia Panel: No results for input(s): "VITAMINB12", "FOLATE", "FERRITIN", "TIBC", "IRON", "RETICCTPCT" in the last 72 hours.   Radiology: [facility] Chest Port 1 View Result Date: 05/02/2023 CLINICAL DATA:  [id number] Acute respiratory failure with hypoxia (HCC) [id number] EXAM: PORTABLE CHEST 1 VIEW COMPARISON:  04/30/2023 FINDINGS: Stable heart size. Low lung volumes. Significant improvement in the aeration of both lungs with mild interstitial prominence remaining. Probable trace left pleural  effusion. No pneumothorax. IMPRESSION: Significant improvement in the aeration of both lungs. Electronically Signed   By: Duanne Guess D.O.   On: 05/02/2023 11:17   CT  CHEST WO CONTRAST Result Date: 04/30/2023 CLINICAL DATA:  Pleural effusion, known or suspected (Ped 0-17y). Shortness of breath. EXAM: CT CHEST WITHOUT CONTRAST TECHNIQUE: Multidetector CT imaging of the chest was performed following the standard protocol without IV contrast. RADIATION DOSE REDUCTION: This exam was performed according to the departmental dose-optimization program which includes automated exposure control, adjustment of the mA and/or kV according to patient size and/or use of iterative reconstruction technique. COMPARISON:  None Available. FINDINGS: Cardiovascular: Normal cardiac size. No pericardial effusion. No aortic aneurysm. There are coronary artery calcifications, in keeping with coronary artery disease. There are also mild-to-moderate peripheral atherosclerotic vascular calcifications of thoracic aorta and its major branches. There is dilation of the main pulmonary trunk measuring up to 2.9 cm, which is nonspecific but can be seen with pulmonary artery hypertension. Mediastinum/Nodes: Visualized thyroid gland appears grossly unremarkable. No solid / cystic mediastinal masses. The esophagus is nondistended precluding optimal assessment. No mediastinal or axillary lymphadenopathy by size criteria. Evaluation of bilateral hila is limited due to lack on intravenous contrast: however, no large hilar lymphadenopathy identified. Lungs/Pleura: The central tracheo-bronchial tree is patent. There are heterogeneous opacities throughout bilateral lungs, compatible with multilobar pneumonia. There also bilateral small pleural effusions. There superimposed atelectatic changes in the left lower lobe as well. No pneumothorax or suspicious lung mass. Upper Abdomen: There is a subcapsular 1.3 x 1.8 cm hypoattenuating structure in the left hepatic dome, not well evaluated on the current exam but characterized as a cyst on the prior exam. There is at least moderate ascites. There is a small sliding hiatal  hernia. Remaining visualized upper abdominal viscera within normal limits. Musculoskeletal: The visualized soft tissues of the chest wall are grossly unremarkable. No suspicious osseous lesions. There are mild multilevel degenerative changes in the visualized spine. IMPRESSION: 1. Bilateral multilobar pneumonia and bilateral small pleural effusions. Follow-up to clearing is recommended. 2. Multiple other nonacute observations, as described above. Aortic Atherosclerosis (ICD10-I70.0). Electronically Signed   By: Jules Schick M.D.   On: 04/30/2023 15:39   DG Chest Port 1 View Result Date: 04/30/2023 CLINICAL DATA:  Shortness of breath bilateral feet swelling EXAM: PORTABLE CHEST 1 VIEW COMPARISON:  07/19/2022 FINDINGS: Mild cardiomegaly. Diffuse bilateral airspace disease, left greater than right. Suspect small layering effusions. No acute bony abnormality. IMPRESSION: Flow lung volumes with bilateral airspace disease which could reflect asymmetric edema or infection. Suspect layering effusions. Electronically Signed   By: Charlett Nose M.D.   On: 04/30/2023 10:11    ECHO 03/18/2023: SEVERE LEFT VENTRICULAR SYSTOLIC DYSFUNCTION WITH SEVERE LVH  ESTIMATED EF: 25%, CALC EF(2D): 26%  NORMAL LA PRESSURES WITH DIASTOLIC DYSFUNCTION (GRADE 1)  NORMAL RIGHT VENTRICULAR SYSTOLIC FUNCTION  VALVULAR REGURGITATION: TRIVIAL AR, MILD MR, MILD PR, MILD TR  VALVULAR STENOSIS: MILD AS, No MS, No PS, No TS   TELEMETRY reviewed by me 05/02/2023: sinus rhythm rate 90s  EKG reviewed by me: Sinus tachycardia rate 141 bpm  Data reviewed by me 05/02/2023: last 24h vitals tele labs imaging I/O hospitalist progress note  Principal Problem:   Acute on chronic HFrEF (heart failure with reduced ejection fraction) (HCC) Active Problems:   Amyloidosis (HCC)   Essential hypertension   Hepatic cirrhosis (HCC)   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HCC)   Chronic kidney disease, stage 3b (HCC) - baseline Scr 1.2-1.5   Ascites  Insulin dependent  type 2 diabetes mellitus (HCC)   Acute respiratory failure with hypoxia and hypercapnia (HCC)   Demand ischemia (HCC)    ASSESSMENT AND PLAN:  PHILLICIA DELAROCA is a 78 y.o. 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 Cardiology was consulted for further evaluation.   # Acute HFrEF # Advanced AL lambda light chain amyloidosis # Demand ischemia Patient with history of advanced AL amyloidosis s/p chemotherapy which has been held since January 2024 presenting with worsening shortness of breath and lower extremity edema.  She recently underwent an echocardiogram outpatient at our clinic 03/2023 which revealed newly reduced EF at 25%.  Since this time her p.o. diuretic dose has been increased outpatient but she has had persistent symptoms.  BNP elevated at 2191.  Troponins trended 68 > 210 > 211.  EKG demonstrated sinus tachycardia. -No plan for any further cardiac imaging or workup at this time. -Transition to po torsemide 40 mg twice daily and will evaluate response. Patient has been taking this medication and prefers to continue on torsemide rather than change to bumex.  -Continue spironolactone 100 mg daily.  -Strict I&Os.  -Overall plan will be for symptom management with diuresis. -Troponin elevation most consistent with demand/supply mismatch and not ACS in the setting of acute HFrEF. -Palliative consulted by primary team.   This patient's plan of care was discussed and created with Dr. Corky Sing and he is in agreement.  SignedGale Journey, PA-C  05/02/2023, 12:00 PM [facility] Cardiology

## 2023-05-02 NOTE — Plan of Care (Signed)
  Problem: Education: Goal: Knowledge of General Education information will improve Description: Including pain rating scale, medication(s)/side effects and non-pharmacologic comfort measures Outcome: Progressing   Problem: Health Behavior/Discharge Planning: Goal: Ability to manage health-related needs will improve Outcome: Progressing   Problem: Clinical Measurements: Goal: Respiratory complications will improve Outcome: Progressing   

## 2023-05-02 NOTE — Consult Note (Signed)
PHARMACY CONSULT NOTE - FOLLOW UP  Pharmacy Consult for Electrolyte Monitoring and Replacement   Recent Labs: Potassium (mmol/L)  Date Value  05/02/2023 4.1   Magnesium (mg/dL)  Date Value  16/01/9603 2.0   Calcium (mg/dL)  Date Value  54/12/8117 8.9   Albumin (g/dL)  Date Value  14/78/2956 3.0 (L)   Phosphorus (mg/dL)  Date Value  21/30/8657 3.9   Sodium (mmol/L)  Date Value  05/02/2023 138   Diet: Carb/Healthy Fluids: None  Pertinent Medications: spironolactone 100 mg daily   Assessment: Nancy Rodriguez is a 78 yo female who presented with worsening shortness of breath and tachypnea over the last few days who has been admitted for acute HFrEF. 03/2023 EF 25%. BNP 2191, Troponin elevated. Scr 1.44 from a baseline around 0.98. Pharmacy has been consulted to manage electrolytes.   Goal of Therapy: Electrolytes WNL  Plan: No electrolyte replacement indicated at this time  Check BMP, Mg, Phos tomorrow with AM labs  Thank you for allowing pharmacy to participate in this patient's care.  Littie Deeds, PharmD Pharmacy Resident  05/02/2023 7:00 AM

## 2023-05-03 ENCOUNTER — Other Ambulatory Visit: Payer: Self-pay

## 2023-05-03 DIAGNOSIS — E8581 Light chain (AL) amyloidosis: Secondary | ICD-10-CM | POA: Diagnosis not present

## 2023-05-03 DIAGNOSIS — I5023 Acute on chronic systolic (congestive) heart failure: Secondary | ICD-10-CM | POA: Diagnosis not present

## 2023-05-03 DIAGNOSIS — J9601 Acute respiratory failure with hypoxia: Secondary | ICD-10-CM | POA: Diagnosis not present

## 2023-05-03 DIAGNOSIS — E854 Organ-limited amyloidosis: Secondary | ICD-10-CM | POA: Diagnosis not present

## 2023-05-03 LAB — GLUCOSE, CAPILLARY
Glucose-Capillary: 105 mg/dL — ABNORMAL HIGH (ref 70–99)
Glucose-Capillary: 196 mg/dL — ABNORMAL HIGH (ref 70–99)

## 2023-05-03 LAB — CBC WITH DIFFERENTIAL/PLATELET
Abs Immature Granulocytes: 0.05 10*3/uL (ref 0.00–0.07)
Basophils Absolute: 0 10*3/uL (ref 0.0–0.1)
Basophils Relative: 1 %
Eosinophils Absolute: 0.3 10*3/uL (ref 0.0–0.5)
Eosinophils Relative: 4 %
HCT: 28.1 % — ABNORMAL LOW (ref 36.0–46.0)
Hemoglobin: 9.8 g/dL — ABNORMAL LOW (ref 12.0–15.0)
Immature Granulocytes: 1 %
Lymphocytes Relative: 24 %
Lymphs Abs: 1.9 10*3/uL (ref 0.7–4.0)
MCH: 25.7 pg — ABNORMAL LOW (ref 26.0–34.0)
MCHC: 34.9 g/dL (ref 30.0–36.0)
MCV: 73.6 fL — ABNORMAL LOW (ref 80.0–100.0)
Monocytes Absolute: 0.6 10*3/uL (ref 0.1–1.0)
Monocytes Relative: 8 %
Neutro Abs: 4.9 10*3/uL (ref 1.7–7.7)
Neutrophils Relative %: 62 %
Platelets: 342 10*3/uL (ref 150–400)
RBC: 3.82 MIL/uL — ABNORMAL LOW (ref 3.87–5.11)
RDW: 19.3 % — ABNORMAL HIGH (ref 11.5–15.5)
WBC: 7.8 10*3/uL (ref 4.0–10.5)
nRBC: 0 % (ref 0.0–0.2)

## 2023-05-03 LAB — BASIC METABOLIC PANEL
Anion gap: 11 (ref 5–15)
BUN: 53 mg/dL — ABNORMAL HIGH (ref 8–23)
CO2: 24 mmol/L (ref 22–32)
Calcium: 8.6 mg/dL — ABNORMAL LOW (ref 8.9–10.3)
Chloride: 105 mmol/L (ref 98–111)
Creatinine, Ser: 1.83 mg/dL — ABNORMAL HIGH (ref 0.44–1.00)
GFR, Estimated: 28 mL/min — ABNORMAL LOW (ref >60)
Glucose, Bld: 136 mg/dL — ABNORMAL HIGH (ref 70–99)
Potassium: 4.1 mmol/L (ref 3.5–5.1)
Sodium: 140 mmol/L (ref 135–145)

## 2023-05-03 LAB — PHOSPHORUS: Phosphorus: 3.7 mg/dL (ref 2.5–4.6)

## 2023-05-03 LAB — BRAIN NATRIURETIC PEPTIDE: B Natriuretic Peptide: 1290.5 pg/mL — ABNORMAL HIGH (ref 0.0–100.0)

## 2023-05-03 LAB — MAGNESIUM: Magnesium: 1.8 mg/dL (ref 1.7–2.4)

## 2023-05-03 MED ORDER — TORSEMIDE 20 MG PO TABS
40.0000 mg | ORAL_TABLET | Freq: Two times a day (BID) | ORAL | 0 refills | Status: AC
  Filled 2023-05-03: qty 360, 90d supply, fill #0

## 2023-05-03 MED ORDER — POTASSIUM CHLORIDE CRYS ER 20 MEQ PO TBCR
20.0000 meq | EXTENDED_RELEASE_TABLET | Freq: Once | ORAL | Status: AC
  Administered 2023-05-03: 20 meq via ORAL
  Filled 2023-05-03: qty 1

## 2023-05-03 MED ORDER — MAGNESIUM SULFATE 2 GM/50ML IV SOLN
2.0000 g | Freq: Once | INTRAVENOUS | Status: AC
  Administered 2023-05-03: 2 g via INTRAVENOUS
  Filled 2023-05-03: qty 50

## 2023-05-03 MED ORDER — SPIRONOLACTONE 100 MG PO TABS
100.0000 mg | ORAL_TABLET | Freq: Every day | ORAL | 0 refills | Status: AC
  Filled 2023-05-03: qty 90, 90d supply, fill #0

## 2023-05-03 MED ORDER — TORSEMIDE 20 MG PO TABS: 40.0000 mg | ORAL_TABLET | Freq: Two times a day (BID) | ORAL | Status: DC

## 2023-05-03 NOTE — Progress Notes (Signed)
[name]  UXL:[id number] DOB: 08/08/1945 DOA: 04/30/2023 PCP: Mick Sell, MD  Subjective: Pt seen and examined. Met with pt's dtr Nancy Rodriguez at bedside. LE edema has resolved. Discussed with cardiology. Pt is stable to be discharged. F/u with cards and nephrology as outpatient.  Pt wants to go home.  Walked 110 feet with PT today.   Hospital Course: HPI: Nancy Rodriguez is a 78 y.o. 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   History obtained from patient's daughter at bedside due to patient's sleepiness.  She states that patient has been experiencing bilateral lower extremity swelling that has been difficult to control for the last several weeks, however since her torsemide was increased to 100 mg twice daily, symptoms improved before plateauing.  Then today, she developed sudden onset shortness of breath.  [name] did not mention any chest pain or palpitations.  No recent illness, fever, chills, nausea, vomiting, diarrhea.  Significant Events: Admitted 04/30/2023 acute on chronic systolic CHF exacerbation   Significant Labs: VBG with pH of 7.17 and pCO2 of 62  WBC of 12.9, platelets of 470, bicarb 21, glucose 154, BUN 36, creatinine 1.44, alkaline phosphatase 350, and GFR of 37. BNP 2191 with troponin of 68. Lactic acid 2.0.   Significant Imaging Studies: Chest x-ray was obtained that demonstrated bilateral airspace disease with suspected layering effusions   Antibiotic Therapy: Anti-infectives (From admission, onward)    Start     Dose/Rate Route Frequency Ordered Stop   04/30/23 2200  acyclovir (ZOVIRAX) 200 MG capsule 400 mg        400 mg Oral 2 times daily 04/30/23 2159     04/30/23 1145  cefTRIAXone (ROCEPHIN) 2 g in sodium  chloride 0.9 % 100 mL IVPB        2 g 200 mL/hr over 30 Minutes Intravenous Once 04/30/23 1132 04/30/23 1243   04/30/23 1145  azithromycin (ZITHROMAX) 500 mg in sodium chloride 0.9 % 250 mL IVPB        500 mg 250 mL/hr over 60 Minutes Intravenous  Once 04/30/23 1132 04/30/23 1347       Procedures:   Consultants: Cardiology Palliative care    Assessment and Plan: * Acute on chronic HFrEF (heart failure with reduced ejection fraction) (HCC) On Admission, Per chart review, patient has a long-term history of HFpEF and recently established with [facility] cardiology.  Echocardiogram obtained at that time demonstrated significant decompensation in LVEF to 25%.  At recent visit with nephrology, her torsemide was increased to 100 mg twice daily.  Despite this, patient had sudden onset shortness of breath today with elevated BNP.  Diuresis will be complicated by her CKD and cirrhosis with generalized anasarca. Cardiology consulted. Palliative care consulted. Pt placed on lasix 100 mg IV q12. 05-01-2023 remains on lasix 100 mg IV q12h. Pt not a candidate for any further treatment for her amyloidosis. Awaiting palliative care consult for GOC/code status discussion. Remains on aldactone 100 mg daily and Toprol-XL 50 mg daily.  05-02-2023 cards has seen patient and stopped IV lasix. Changed to po demadex 40 mg bid. Remains on aldactone 100 mg daily, troprol-xl 50 mg daily. Weight is down 154 lbs on admission. Currently 141 lbs.  Holding losartan at discharge due to CKD stage 3b. F/u with  cards and nephrology next week. Discharge BNP 1290.  05-03-2023 weight down to 136.6 lbs. Cards will continue demadex 40 mg bid, aldactone 100 mg qday, toprol-xl 50 mg daily.   Acute respiratory failure with hypoxia and hypercapnia (HCC) On admission, Patient presenting with severe shortness of breath and hypoxic and hypercarbic respiratory failure suspected to be due to underlying heart disease and anasarca.  Low  suspicion for infection due to lack of other symptoms such as fever, rhinorrhea, cough, etc. Continue BiPAP as needed. CT chest shows small bilateral pleural effusions. Was started on IV abx.  05-01-2023 my index of suspicion for pneumonia is low. Procal never done yesterday. Will order one now. If procal negative, will stop IV abx. Effusion look too small for thoracentesis.  05-02-2023 procal returned yesterday at 5.37. which is surprising to me. Repeat CXR today shows dramatic improvement in aeration and near resolution of her pulmonary edema. Have restarted IV rocephin due to elevated procalcitonin but still not clear if pt has true pneumonia or not.  05-03-2023 does not qualify for home O2 even with ambulation. Weaned to RA. RA sats >=92% while working with PT. Pt's CXR showed improvement with diuresis. Abx were continued as a precaution but I still don't think she has/had pneumonia. Will not discharge her on abx.  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HCC) 05-01-2023 likely the cause of her ascites. No indication for paracentesis.  05-02-2023 stable. No indication for paracentesis.  05-03-2023 stable. Has ascites. No indication for paracentesis.  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  She underwent treatment with chemotherapy, which has been held since January 2024. 05-01-2023 per cards, nothing more can be done for her cardiac amyloidosis. Awaiting palliative care consult for GOC/code status/hospice discussion. 05-02-2023 pt met with palliative care team yesterday. No decisions have been made yet.  05-03-2023 met with pt and dtr Nancy Rodriguez at bedside. Reviewed that pt's amyloidosis has effected her heart, liver and likely kidneys. That there is really no treatment options for  her. That her disease will progress and that she is going to get sicker and sicker as time goes on.   Unclear how much patient and dtr understand this disease process vs denial.  Palliative care unable to get pt to commit to naming a HCPOA or discussing/changing code status. Maybe her oncologist at [facility] can make some headway in this discussion. Pt and dtr are not understanding my explanation or denying that pt is going to get worse.  Demand ischemia (HCC) On admission, Suspect demand in the setting of acute hypoxic and hypercarbic respiratory failure.  No reported chest pain. Cardiology agrees that this is not ACS but demand ischemia from her CHF.  05-01-2023 stable. No chest pain.  Insulin dependent type 2 diabetes mellitus (HCC) On admission, Hold home regimen. Placed on SSI, moderate.  Semglee 5 units at bedtime 05-01-2023 CBG range is acceptable. Continue with lantus 5 every day and SSI.  05-02-2023 CBG is acceptable ranges.  Ascites 05-01-2023 likely due to amyloid liver.. No indication for paracentesis at this point.  05-02-2023 stable. No indication for paracentesis.  Chronic kidney disease, stage 3b (HCC) - baseline Scr 1.2-1.5 On admission, Per chart review, baseline creatinine between 1.2 and 1.5 for the last > 3 months. Currently at baseline. Continue to monitor closely while diuresing. Strict in and out.  Daily weights.  Avoid other nephrotoxic agents as able 05-01-2023 monitor  Scr. SCR today 1.64 05-02-2023 scr stable at 1.69 today. BUN 44. Cards has change diuretics over to po demadex 40 mg bid.  05-03-2023 her CKD stage 3b is a contraindication for ARB/ACEI at discharge. Discharge Scr 1.83.cardiology satisfied with Scr at discharge.  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  05-01-2023 bedside abd U/S shows moderate abd ascites. Pt's abd is not tense. Currently no indication for paracentesis. Continue with lasix and aldactone.  05-02-2023 due to amyloid liver. No  indication for paracentesis.  Essential hypertension On admission, Significantly hypertensive on initial arrival with rapid improvement after BiPAP. Continue home regimen 05-01-2023 continue with aldactone 100 mg qday, lasix 100 mg IV bid, toprol-xl 50 mg qday. ARB on hold due to aggressive diuresis plan. 05-02-2023 remains on aldactone 100 mg qday, toprol-xl 50 mg qday. Cards changed diuretics over to demadex 40 mg bid.  ARB on hold due to aggressive diuresis plan.  05-03-2023 discharge to home on aldactone 100 mg day, toprol-xl 50 mg qday, demadex 40 mg bid. Hold losartan at discharge due to CKD stage 3b per my discussion with cardiology.   DVT prophylaxis: enoxaparin (LOVENOX) injection 30 mg Start: 04/30/23 2200    Code Status: Full Code Family Communication: discussed with pt and dtr [name] at bedside Disposition Plan: return home Reason for continuing need for hospitalization: medically stable for DC. Discussed DC with cardiology service. They have given clearance for discharge. Pt able to ambulate 110 feet with PT. No hypoxia noted.  Home health PT/OT/RN arranged as well as DME of 3-in-1 and RW/rollator.  Objective: Vitals:   05/03/23 0308 05/03/23 0442 05/03/23 0823 05/03/23 1242  BP: (!) 146/88  139/87 120/73  Pulse: 97  90 87  Resp: 19  16 16   Temp: 98.1 F (36.7 C)  98 F (36.7 C) 98.3 F (36.8 C)  TempSrc:      SpO2: 100%  100% 96%  Weight:  62 kg    Height:        Intake/Output Summary (Last 24 hours) at 05/03/2023 1308 Last data filed at 05/03/2023 0900 Gross per 24 hour  Intake 340 ml  Output 600 ml  Net -260 ml   Filed Weights   04/30/23 1501 05/02/23 0503 05/03/23 0442  Weight: 69.9 kg 64.1 kg 62 kg    Examination:  Physical Exam Vitals and nursing note reviewed.  Constitutional:      General: She is not in acute distress.    Appearance: She is not toxic-appearing or diaphoretic.  HENT:     Head: Normocephalic and atraumatic.     Nose: Nose normal.   Eyes:     General: No scleral icterus. Cardiovascular:     Rate and Rhythm: Normal rate and regular rhythm.  Pulmonary:     Effort: Pulmonary effort is normal.     Breath sounds: Normal breath sounds.  Abdominal:     General: Bowel sounds are normal.     Palpations: Abdomen is soft. There is fluid wave.     Tenderness: There is no abdominal tenderness.  Musculoskeletal:     Right lower leg: No edema.     Left lower leg: No edema.  Skin:    General: Skin is warm and dry.     Capillary Refill: Capillary refill takes less than 2 seconds.  Neurological:     General: No focal deficit present.     Mental Status: She is alert  and oriented to person, place, and time.     Data Reviewed: I have personally reviewed following labs and imaging studies  CBC: Recent Labs  Lab 04/30/23 0814 05/01/23 0531 05/03/23 0552  WBC 12.9* 9.4 7.8  NEUTROABS 6.7 6.6 4.9  HGB 12.8 9.9* 9.8*  HCT 38.6 29.1* 28.1*  MCV 77.5* 77.0* 73.6*  PLT 470* 338 342   Basic Metabolic Panel: Recent Labs  Lab 04/30/23 0814 04/30/23 1532 05/01/23 0531 05/02/23 0437 05/03/23 0552  NA 140  --  140 138 140  K 4.5  --  4.2 4.1 4.1  CL 105  --  108 104 105  CO2 21*  --  21* 21* 24  GLUCOSE 154*  --  119* 128* 136*  BUN 36*  --  42* 44* 53*  CREATININE 1.44*  --  1.64* 1.69* 1.83*  CALCIUM 9.2  --  8.4* 8.9 8.6*  MG  --  1.6* 2.3 2.0 1.8  PHOS  --   --  4.3 3.9 3.7   GFR: Estimated Creatinine Clearance: 21.9 mL/min (A) (by C-G formula based on SCr of 1.83 mg/dL (H)). Liver Function Tests: Recent Labs  Lab 04/30/23 0814  AST 37  ALT 21  ALKPHOS 350*  BILITOT 1.3*  PROT 7.2  ALBUMIN 3.0*   BNP (last 3 results) Recent Labs    04/30/23 0814 05/03/23 0552  BNP 2,191.0* 1,290.5*   CBG: Recent Labs  Lab 05/02/23 0846 05/02/23 1146 05/02/23 1553 05/03/23 0828 05/03/23 1247  GLUCAP 107* 167* 178* 105* 196*   Sepsis Labs: Recent Labs  Lab 04/30/23 0951 04/30/23 1203 05/01/23 1215   PROCALCITON  --   --  5.37  LATICACIDVEN 2.0* 1.2  --     Recent Results (from the past 240 hours)  Blood Culture (routine x 2)     Status: None (Preliminary result)   Collection Time: 04/30/23  8:13 AM   Specimen: BLOOD  Result Value Ref Range Status   Specimen Description BLOOD RAC  Final   Special Requests   Final    BOTTLES DRAWN AEROBIC AND ANAEROBIC Blood Culture results may not be optimal due to an inadequate volume of blood received in culture bottles   Culture   Final    NO GROWTH 3 DAYS Performed at [facility], [address], [place], [place] [address]    Report Status PENDING  Incomplete  Blood Culture (routine x 2)     Status: None (Preliminary result)   Collection Time: 04/30/23  9:52 AM   Specimen: BLOOD RIGHT HAND  Result Value Ref Range Status   Specimen Description BLOOD RIGHT HAND  Final   Special Requests   Final    BOTTLES DRAWN AEROBIC ONLY Blood Culture results may not be optimal due to an inadequate volume of blood received in culture bottles   Culture   Final    NO GROWTH 3 DAYS Performed at [facility], [address], [place], [place] [address]    Report Status PENDING  Incomplete     Radiology Studies: DG Chest Port 1 View Result Date: 05/02/2023 CLINICAL DATA:  [id number] Acute respiratory failure with hypoxia (HCC) [id number] EXAM: PORTABLE CHEST 1 VIEW COMPARISON:  04/30/2023 FINDINGS: Stable heart size. Low lung volumes. Significant improvement in the aeration of both lungs with mild interstitial prominence remaining. Probable trace left pleural effusion. No pneumothorax. IMPRESSION: Significant improvement in the aeration of both lungs. Electronically Signed   By: Duanne Guess D.O.   On:  05/02/2023 11:17    Scheduled Meds:  acyclovir  400 mg Oral BID   enoxaparin (LOVENOX) injection  30 mg Subcutaneous Q24H   ferrous sulfate  325 mg Oral Q breakfast   gabapentin  100 mg Oral BID   insulin aspart  0-15 Units Subcutaneous  TID WC   insulin glargine-yfgn  5 Units Subcutaneous QHS   metoprolol succinate  50 mg Oral Daily   pantoprazole  40 mg Oral Daily   sertraline  50 mg Oral Daily   sodium chloride flush  3 mL Intravenous Q12H   spironolactone  100 mg Oral Daily   [START ON 05/04/2023] torsemide  40 mg Oral BID   Continuous Infusions:  cefTRIAXone (ROCEPHIN)  IV 1 g (05/03/23 0932)     LOS: 3 days   Time spent: 40 minutes  Carollee Herter, DO  [facility]  05/03/2023, 1:08 PM

## 2023-05-03 NOTE — Care Management Important Message (Signed)
Important Message  Patient Details  Name: COREE GATTI MRN: [id number] Date of Birth: 03/17/1946   Important Message Given:  Yes - Medicare IM     Sherilyn Banker 05/03/2023, 11:05 AM

## 2023-05-03 NOTE — Consult Note (Signed)
PHARMACY CONSULT NOTE - FOLLOW UP  Pharmacy Consult for Electrolyte Monitoring and Replacement   Recent Labs: Potassium (mmol/L)  Date Value  05/03/2023 4.1   Magnesium (mg/dL)  Date Value  16/01/9603 1.8   Calcium (mg/dL)  Date Value  54/12/8117 8.6 (L)   Albumin (g/dL)  Date Value  14/78/2956 3.0 (L)   Phosphorus (mg/dL)  Date Value  21/30/8657 3.7   Sodium (mmol/L)  Date Value  05/03/2023 140   Diet: Carb/Healthy Fluids: None  Pertinent Medications: spironolactone 100 mg daily, torsemide 40 mg PO BID   Assessment:  is a 78 yo female who presented with worsening shortness of breath and tachypnea over the last few days who has been admitted for acute HFrEF. 03/2023 EF 25%. BNP 2191, Troponin elevated. Pharmacy has been consulted to manage electrolytes.   Goal of Therapy: Electrolytes WNL  Plan: Mag 1.8; will order magnesium 2 gm IV x 1  Torsemide 40 mg PO BID resumed 1/16; will order KCl 20 mEq PO x 1 to keep K+ in range given expected decrease with loop diuretic  Check BMP, Mg, Phos tomorrow with AM labs  Thank you for allowing pharmacy to participate in this patient's care.  Littie Deeds, PharmD Pharmacy Resident  05/03/2023 6:59 AM

## 2023-05-03 NOTE — Progress Notes (Signed)
Heart Failure Stewardship Pharmacy Note  PCP: Mick Sell, MD PCP-Cardiologist: Dr. Corky Sing  HPI: Nancy Rodriguez is a 78 y.o. female with  AL Amyloidosis s/p chemotherapy, complicated by cirrhosis and CKD Stage 3b, HFrEF, type 2 diabetes, hypertension, hyperlipidemia  who presented with progressive worsening shortness of breath/tachypnea over several days and acute worsening 04/30/23 prompting presentation to the ED. Acute onset shortness of breath was accompanied by marked hypertension (186/90 mmHg). CT scan on admission showed bialteral multilobular pneumonia and bilateral small pleural effusions. BNP on admission was elevated at 2191. HS-troponin on admission was 68, trending to 210. Patient and daughter report excellent functional status ~2 weeks ago, able to cook, clean, and perform ADLs.  Patient has a history of MGUS since 2009 and was followed regularly. In December 2021 patient was noted to have elevated AST, ALT and ALK and underwent ultrasound guided liver biopsy on 05/11/2020, which showed diffuse amyloidosis. A bone marrow biopsy on 06/09/20 showed 8% monoclonal plasma cells. Patient was started on Dara-CyBord. On 11/01/20 her chemotherapy was changed to Dara-Rev-Dex. She tolerated the chemotherapy quite well. Chemotherapy held in 2023. In 04/2022 was started back on dara chemotherapy. She indeed received weekly dara treatment for a few weeks, but then the treatment was held due to the need of weekly treatment and patient not feeling well. Patient is not currently on chemotherapy.   Pertinent cardiac history: Cardiac MRI in 2022 showed LVEF of 71% with upper septal thickening. Echo 06/2021 showed low normal systolic function of 50 to 55%. Echo 03/2023 showed LVEF of 25% with grade I diastolic dysfunction. Patient seen by Dr. Allena Katz with [facility] heart failure who noted that there is likely little benefit of MRI or aggressive GDMT titration.  Pertinent Lab Values: Creatinine  Date Value  Ref Range Status  12/28/2022 0.98 0.44 - 1.00 mg/dL Final   Creatinine, Ser  Date Value Ref Range Status  05/03/2023 1.83 (H) 0.44 - 1.00 mg/dL Final   BUN  Date Value Ref Range Status  05/03/2023 53 (H) 8 - 23 mg/dL Final   Potassium  Date Value Ref Range Status  05/03/2023 4.1 3.5 - 5.1 mmol/L Final   Sodium  Date Value Ref Range Status  05/03/2023 140 135 - 145 mmol/L Final   B Natriuretic Peptide  Date Value Ref Range Status  04/30/2023 2,191.0 (H) 0.0 - 100.0 pg/mL Final    Comment:    Performed at [facility], [address] [place], [place] [address]   Magnesium  Date Value Ref Range Status  05/03/2023 1.8 1.7 - 2.4 mg/dL Final    Comment:    Performed at [facility], [address] [place], [place] [address]   Hgb A1c MFr Bld  Date Value Ref Range Status  09/27/2021 6.4 (H) 4.8 - 5.6 % Final    Comment:    (NOTE) Pre diabetes:          5.7%-6.4%  Diabetes:              >6.4%  Glycemic control for   <7.0% adults with diabetes    TSH  Date Value Ref Range Status  05/20/2020 6.489 (H) 0.350 - 4.500 uIU/mL Final    Comment:    Performed by a 3rd Generation assay with a functional sensitivity of <=0.01 uIU/mL. Performed at [facility], [address] [place], [place] [address]    LDH  Date Value Ref Range Status  07/19/2022 127 98 - 192 U/L Final  Comment:    Performed at [facility], [address] [place], [place] [address]    Vital Signs:  Temp:  [97.9 F (36.6 C)-100.1 F (37.8 C)] 98.1 F (36.7 C) (01/17 0308) Pulse Rate:  [91-97] 97 (01/17 0308) Cardiac Rhythm: Normal sinus rhythm;Bundle branch block (01/16 1900) Resp:  [16-19] 19 (01/17 0308) BP: (117-146)/(73-94) 146/88 (01/17 0308) SpO2:  [94 %-100 %] 100 % (01/17 0308) Weight:  [62 kg (136 lb 9.6 oz)] 62 kg (136 lb 9.6 oz) (01/17 0442)  Intake/Output Summary (Last 24 hours) at 05/03/2023 0702 Last data filed at 05/02/2023 1602 Gross per  24 hour  Intake 583 ml  Output --  Net 583 ml   Current Heart Failure Medications:  Loop diuretic: none Beta-Blocker: metoprolol succinate 50 mg daily ACEI/ARB/ARNI: none MRA: spironolactone 100 mg daily SGLT2i: none Other: none  Prior to admission Heart Failure Medications:  Loop diuretic: torsemide 40 mg daily (self-decreased from 100 mg BID) Beta-Blocker: none ACEI/ARB/ARNI: none (allergy) MRA: spironolactone 200 mg daily SGLT2i: none Other:hydralazine 25 mg TID, amlodipine 5 mg daily  Assessment: 1. 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 NYHA class IV symptoms.  -Symptoms: Reports improvement in shortness of breath and LEE. Still feels weakness and fatigue. -Volume: Patient appears euvolemic on exam. LEE is much improved. Urine color is clear. Creatinine is trending up slightly.   -Hemodynamics: BP remains elevated. HR 90s. -BB: metoprolol succinate 50 mg daily.  -ACEI/ARB/ARNI: Contraindicated due to history of anaphylaxis to lisinopril. Given age, would not rechallenge with ARB. -MRA: Patient is on high dose spironolactone 100 mg daily -SGLT2i: Patient may be able to start SGLT2i after external catheter is removed. Unsure the degree of benefit given age/ disease etiology and warrants patient provider discussion. No history of UTI. -Appears patient has never been diagnosed with cardiac involvement of AL amyloidosis. HF physician outpatient does not believe further work-up would be of benefit given limited therapeutic options. -Recommend stopping amlodipine at discharge as it can worsen LEE and has no CV benefit for CHF patients. Can instead consolidate to BiDil.  Plan: 1) Medication changes recommended at this time: -If goal is full scope of treatment, could consider resuming hydralazine 25 mg TID along with isosorbide. Given high incidence of autonomic dysfunction, will need to  monitor closely. -Magnesium 2g IV ordered  2) Patient assistance: -Pending  3) Education: - Patient has been educated on current HF medications and potential additions to HF medication regimen - Patient verbalizes understanding that over the next few months, these medication doses may change and more medications may be added to optimize HF regimen - Patient has been educated on basic disease state pathophysiology and goals of therapy  Medication Assistance / Insurance Benefits Check: Does the patient have prescription insurance?    Type of insurance plan:  Does the patient qualify for medication assistance through manufacturers or grants? Pending  Outpatient Pharmacy: Prior to admission outpatient pharmacy: [facility]     Please do not hesitate to reach out with questions or concerns,  Enos Fling, PharmD, CPP, BCPS Heart Failure Pharmacist  Phone - [phone] 05/03/2023 7:02 AM

## 2023-05-03 NOTE — TOC Transition Note (Signed)
Transition of Care [facility]) - Discharge Note   Patient Details  Name: Nancy Rodriguez MRN: [id number] Date of Birth: Nov 12, 1945  [facility] CM/SW Contact:  Truddie Hidden, RN Phone Number: 05/03/2023, 1:54 PM   Clinical Narrative:    Patient is discharging home.  Referral accepted by [place] from [facility].  Patient daughter notified.   TOC signing off.       Barriers to Discharge: Continued Medical Work up   Patient Goals and CMS Choice   CMS Medicare.gov Compare Post Acute Care list provided to:: Patient Represenative (must comment) (Daughter)        Discharge Placement                       Discharge Plan and Services Additional resources added to the After Visit Summary for                                       Social Drivers of Health (SDOH) Interventions SDOH Screenings   Food Insecurity: No Food Insecurity (05/01/2023)  Housing: Low Risk  (05/01/2023)  Transportation Needs: No Transportation Needs (05/01/2023)  Utilities: Not At Risk (05/01/2023)  Depression (PHQ2-9): Low Risk  (09/07/2019)  Financial Resource Strain: Low Risk  (03/29/2023)   Received from [facility]  Social Connections: Moderately Integrated (05/01/2023)  Tobacco Use: Low Risk  (04/30/2023)     Readmission Risk Interventions     No data to display

## 2023-05-03 NOTE — Progress Notes (Signed)
Physical Therapy Treatment Patient Details Name: Nancy Rodriguez MRN: [id number] DOB: 1945/10/29 Today's Date: 05/03/2023   History of Present Illness 78 y/o female presented to ED on 04/30/23 for SOB and bilateral feet swelling. Admitted for acute on chronic HFrEF and acute respiratory failure. PMH: NSTEMI, CKD stage 3b, T2DM, HTN, hepatic cirrhosis    PT Comments  Pt resting in bed upon PT arrival; pt initially appearing very sleepy in bed but became alert once pt started participating in activities (pt stopping and closing her eyes at times d/t feeling tired but pt kept conversation with therapist during session).  Pt's gown and bed linens noted to be soaked upon getting pt up (appeared to be related to [facility]) so pt's gown changed and nursing staff came to assist with peri-care and bed linen change.  During session pt CGA with transfers and ambulation 110 feet with RW use.  No loss of balance noted during sessions activities.  SpO2 sats 92% or greater on room air during sessions activities.  Pt and pt's daughter educated on pacing/activity modification d/t pt's impaired activity tolerance from baseline--both verbalizing appropriate understanding.  Pt's daughter planning to assist pt upon hospital discharge.   If plan is discharge home, recommend the following: A little help with walking and/or transfers;A little help with bathing/dressing/bathroom;Assistance with cooking/housework;Direct supervision/assist for financial management;Direct supervision/assist for medications management;Assist for transportation;Help with stairs or ramp for entrance   Can travel by private vehicle      Yes  Equipment Recommendations  Rolling walker (2 wheels)    Recommendations for Other Services       Precautions / Restrictions Precautions Precautions: Fall Restrictions Weight Bearing Restrictions Per Provider Order: No     Mobility  Bed Mobility Overal bed mobility: Needs Assistance Bed Mobility:  Supine to Sit     Supine to sit: Mod assist, HOB elevated (assist for trunk (pt appearing still sleepy/still waking up)) Sit to supine: Supervision, HOB elevated        Transfers Overall transfer level: Needs assistance Equipment used: Rolling walker (2 wheels) Transfers: Sit to/from Stand Sit to Stand: Contact guard assist, Supervision           General transfer comment: CGA 1st trial standing and SBA 2nd trial standing; steady transfer with RW use    Ambulation/Gait Ambulation/Gait assistance: Contact guard assist Gait Distance (Feet): 110 Feet Assistive device: Rolling walker (2 wheels) Gait Pattern/deviations: Step-through pattern, Decreased step length - right, Decreased step length - left Gait velocity: decreased     General Gait Details: steady ambulating with RW use; pt occasionally stopping and closing her eyes but still talking with therapist (pt reporting feeling tired)   Optometrist     Tilt Bed    Modified Rankin (Stroke Patients Only)       Balance Overall balance assessment: Needs assistance Sitting-balance support: No upper extremity supported, Feet supported Sitting balance-Leahy Scale: Good Sitting balance - Comments: steady reaching within BOS   Standing balance support: Bilateral upper extremity supported, During functional activity, Reliant on assistive device for balance Standing balance-Leahy Scale: Good Standing balance comment: steady ambulating with RW use                            Cognition Arousal: Alert Behavior During Therapy: WFL for tasks assessed/performed Overall Cognitive Status: Within Functional Limits for tasks assessed  Following Commands: Follows one step commands with increased time, Follows one step commands consistently                Exercises      General Comments  Nursing cleared pt for participation in physical therapy.   Pt agreeable to PT session.      Pertinent Vitals/Pain Pain Assessment Pain Assessment: No/denies pain HR up to 103 bpm with activity (around 90 bpm at rest).    Home Living                          Prior Function            PT Goals (current goals can now be found in the care plan section) Acute Rehab PT Goals Patient Stated Goal: to go home PT Goal Formulation: With patient/family Time For Goal Achievement: 05/15/23 Potential to Achieve Goals: Good Progress towards PT goals: Progressing toward goals    Frequency    Min 1X/week      PT Plan      Co-evaluation              AM-PAC PT "6 Clicks" Mobility   Outcome Measure  Help needed turning from your back to your side while in a flat bed without using bedrails?: A Little Help needed moving from lying on your back to sitting on the side of a flat bed without using bedrails?: A Little Help needed moving to and from a bed to a chair (including a wheelchair)?: A Little Help needed standing up from a chair using your arms (e.g., wheelchair or bedside chair)?: A Little Help needed to walk in hospital room?: A Little Help needed climbing 3-5 steps with a railing? : A Little 6 Click Score: 18    End of Session Equipment Utilized During Treatment: Gait belt Activity Tolerance: Patient tolerated treatment well Patient left: in bed;with call bell/phone within reach;with bed alarm set;with family/visitor present Nurse Communication: Mobility status;Precautions;Other (comment) (Pt requesting purewick to be placed; pt's SpO2 sats and HR during session) PT Visit Diagnosis: Unsteadiness on feet (R26.81);Muscle weakness (generalized) (M62.81)     Time: 1610-9604 PT Time Calculation (min) (ACUTE ONLY): 36 min  Charges:    $Gait Training: 8-22 mins $Therapeutic Activity: 8-22 mins PT General Charges $$ ACUTE PT VISIT: 1 Visit                     Hendricks Limes, PT 05/03/23, 12:36 PM

## 2023-05-03 NOTE — TOC Progression Note (Addendum)
Transition of Care [facility]) - Progression Note    Patient Details  Name: Nancy Rodriguez MRN: [id number] Date of Birth: [birth year]  [facility] CM/SW Contact  Truddie Hidden, RN Phone Number: 05/03/2023, 11:25 AM  Clinical Narrative:    Spoke with patient's daughter regarding HH. She is agreeable to [facility] and does not have a preference of HH. She is been advised the accepting agency will contact her directly to schedule the appointment for [facility] within 24-48 hours.   Referral sent and declined by [facility] [place].   Referral for [facility] sent to [place] from [place].     Expected Discharge Plan: Home w Home Health Services Barriers to Discharge: Continued Medical Work up  Expected Discharge Plan and Services       Living arrangements for the past 2 months: Single Family Home                                       Social Determinants of Health (SDOH) Interventions SDOH Screenings   Food Insecurity: No Food Insecurity (05/01/2023)  Housing: Low Risk  (05/01/2023)  Transportation Needs: No Transportation Needs (05/01/2023)  Utilities: Not At Risk (05/01/2023)  Depression (PHQ2-9): Low Risk  (09/07/2019)  Financial Resource Strain: Low Risk  (03/29/2023)   Received from [facility]  Social Connections: Moderately Integrated (05/01/2023)  Tobacco Use: Low Risk  (04/30/2023)    Readmission Risk Interventions     No data to display

## 2023-05-03 NOTE — Discharge Summary (Signed)
[facility] Physician Discharge Summary   Patient name: Nancy Rodriguez  Admit date:     04/30/2023  Discharge date: 05/03/2023  Attending Physician: Verdene Lennert [[id number]]  Discharge Physician: Carollee Herter   PCP: Mick Sell, MD  Admitted From: Home Disposition:  Home  Recommendations for Outpatient Follow-up:  Follow up with PCP in 1-2 weeks Follow up with [facility] in 1 week Follow up with [facility] [place] [facility] in 1 week   Home Health:Yes PT/OT/RN/NA/WS Equipment/Devices: 3-in-1 chair, rollator/RW  Discharge Condition:Stable CODE STATUS:FULL Diet recommendation: Heart Healthy Fluid Restriction: 1200 ml/day  Hospital Summary: HPI: Nancy Rodriguez is a 78 y.o. 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   History obtained from patient's daughter at bedside due to patient's sleepiness.  She states that patient has been experiencing bilateral lower extremity swelling that has been difficult to control for the last several weeks, however since her torsemide was increased to 100 mg twice daily, symptoms improved before plateauing.  Then today, she developed sudden onset shortness of breath.  Nancy Rodriguez did not mention any chest pain or palpitations.  No recent illness, fever, chills, nausea, vomiting, diarrhea.  Significant Events: Admitted 04/30/2023 acute on chronic systolic CHF exacerbation   Significant Labs: VBG with pH of 7.17 and pCO2 of 62  WBC of 12.9, platelets of 470, bicarb 21, glucose 154, BUN 36, creatinine 1.44, alkaline phosphatase 350, and GFR of 37. BNP 2191 with troponin of 68. Lactic acid 2.0.   Significant Imaging Studies: Chest x-ray was obtained that demonstrated bilateral airspace disease with suspected layering  effusions   Antibiotic Therapy: Anti-infectives (From admission, onward)    Start     Dose/Rate Route Frequency Ordered Stop   04/30/23 2200  acyclovir (ZOVIRAX) 200 MG capsule 400 mg        400 mg Oral 2 times daily 04/30/23 2159     04/30/23 1145  cefTRIAXone (ROCEPHIN) 2 g in sodium chloride 0.9 % 100 mL IVPB        2 g 200 mL/hr over 30 Minutes Intravenous Once 04/30/23 1132 04/30/23 1243   04/30/23 1145  azithromycin (ZITHROMAX) 500 mg in sodium chloride 0.9 % 250 mL IVPB        500 mg 250 mL/hr over 60 Minutes Intravenous  Once 04/30/23 1132 04/30/23 1347       Procedures:   Consultants: Cardiology Palliative care   Hospital Course by Problem: * Acute on chronic HFrEF (heart failure with reduced ejection fraction) (HCC) On Admission, Per chart review, patient has a long-term history of HFpEF and recently established with [facility] cardiology.  Echocardiogram obtained at that time demonstrated significant decompensation in LVEF to 25%.  At recent visit with nephrology, her torsemide was increased to 100 mg twice daily.  Despite this, patient had sudden onset shortness of breath today with elevated BNP.  Diuresis will be complicated by her CKD and cirrhosis with generalized anasarca. Cardiology consulted. Palliative care consulted. Pt placed on lasix 100 mg IV q12. 05-01-2023 remains on lasix 100 mg IV q12h. Pt not a candidate for any further treatment for her amyloidosis. Awaiting palliative care consult for GOC/code status discussion. Remains on aldactone 100 mg daily and Toprol-XL 50 mg daily.  05-02-2023 cards has seen patient and stopped IV lasix. Changed to po demadex  40 mg bid. Remains on aldactone 100 mg daily, troprol-xl 50 mg daily. Weight is down 154 lbs on admission. Currently 141 lbs.  Holding losartan at discharge due to CKD stage 3b. F/u with cards and nephrology next week. Discharge BNP 1290.  05-03-2023 weight down to 136.6 lbs. Cards will continue demadex 40 mg  bid, aldactone 100 mg qday, toprol-xl 50 mg daily.   Acute respiratory failure with hypoxia and hypercapnia (HCC) On admission, Patient presenting with severe shortness of breath and hypoxic and hypercarbic respiratory failure suspected to be due to underlying heart disease and anasarca.  Low suspicion for infection due to lack of other symptoms such as fever, rhinorrhea, cough, etc. Continue BiPAP as needed. CT chest shows small bilateral pleural effusions. Was started on IV abx.  05-01-2023 my index of suspicion for pneumonia is low. Procal never done yesterday. Will order one now. If procal negative, will stop IV abx. Effusion look too small for thoracentesis.  05-02-2023 procal returned yesterday at 5.37. which is surprising to me. Repeat CXR today shows dramatic improvement in aeration and near resolution of her pulmonary edema. Have restarted IV rocephin due to elevated procalcitonin but still not clear if pt has true pneumonia or not.  05-03-2023 does not qualify for home O2 even with ambulation. Weaned to RA. RA sats >=92% while working with PT. Pt's CXR showed improvement with diuresis. Abx were continued as a precaution but I still don't think she has/had pneumonia. Will not discharge her on abx.  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HCC) 05-01-2023 likely the cause of her ascites. No indication for paracentesis.  05-02-2023 stable. No indication for paracentesis.  05-03-2023 stable. Has ascites. No indication for paracentesis.  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  She underwent treatment with chemotherapy, which has been held since January 2024. 05-01-2023 per cards, nothing more can be done for her cardiac amyloidosis. Awaiting palliative care consult for GOC/code status/hospice discussion. 05-02-2023 pt met with palliative care team yesterday. No decisions have  been made yet.  05-03-2023 met with pt and dtr Nancy Rodriguez at bedside. Reviewed that pt's amyloidosis has effected her heart, liver and likely kidneys. That there is really no treatment options for  her. That her disease will progress and that she is going to get sicker and sicker as time goes on.  Unclear how much patient and dtr understand this disease process vs denial.  Palliative care unable to get pt to commit to naming a HCPOA or discussing/changing code status. Maybe her oncologist at [facility] can make some headway in this discussion. Pt and dtr are not understanding my explanation or denying that pt is going to get worse.  Demand ischemia (HCC) On admission, Suspect demand in the setting of acute hypoxic and hypercarbic respiratory failure.  No reported chest pain. Cardiology agrees that this is not ACS but demand ischemia from her CHF.  05-01-2023 stable. No chest pain.  Insulin dependent type 2 diabetes mellitus (HCC) On admission, Hold home regimen. Placed on SSI, moderate.  Semglee 5 units at bedtime 05-01-2023 CBG range is acceptable. Continue with lantus 5 every day and SSI.  05-02-2023 CBG is acceptable ranges.  Ascites 05-01-2023 likely due to amyloid liver.. No indication for paracentesis at this point.  05-02-2023 stable. No indication for paracentesis.  Chronic kidney disease, stage 3b (HCC) - baseline Scr 1.2-1.5 On admission, Per chart review, baseline creatinine  between 1.2 and 1.5 for the last > 3 months. Currently at baseline. Continue to monitor closely while diuresing. Strict in and out.  Daily weights.  Avoid other nephrotoxic agents as able 05-01-2023 monitor Scr. SCR today 1.64 05-02-2023 scr stable at 1.69 today. BUN 44. Cards has change diuretics over to po demadex 40 mg bid.  05-03-2023 her CKD stage 3b is a contraindication for ARB/ACEI at discharge. Discharge Scr 1.83.cardiology satisfied with Scr at discharge.  Hepatic cirrhosis (HCC) On admission, pt with History  of decompensated cirrhosis with portal hypertension and ascites secondary to AL Amyloidosis.  05-01-2023 bedside abd U/S shows moderate abd ascites. Pt's abd is not tense. Currently no indication for paracentesis. Continue with lasix and aldactone.  05-02-2023 due to amyloid liver. No indication for paracentesis.  Essential hypertension On admission, Significantly hypertensive on initial arrival with rapid improvement after BiPAP. Continue home regimen 05-01-2023 continue with aldactone 100 mg qday, lasix 100 mg IV bid, toprol-xl 50 mg qday. ARB on hold due to aggressive diuresis plan. 05-02-2023 remains on aldactone 100 mg qday, toprol-xl 50 mg qday. Cards changed diuretics over to demadex 40 mg bid.  ARB on hold due to aggressive diuresis plan.  05-03-2023 discharge to home on aldactone 100 mg day, toprol-xl 50 mg qday, demadex 40 mg bid. Hold losartan at discharge due to CKD stage 3b per my discussion with cardiology.    Discharge Diagnoses:  Principal Problem:   Acute on chronic HFrEF (heart failure with reduced ejection fraction) (HCC) Active Problems:   Amyloidosis (HCC)   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HCC)   Acute respiratory failure with hypoxia and hypercapnia (HCC)   Essential hypertension   Hepatic cirrhosis (HCC)   Chronic kidney disease, stage 3b (HCC) - baseline Scr 1.2-1.5   Ascites   Insulin dependent type 2 diabetes mellitus (HCC)   Demand ischemia [facility])   Discharge Instructions  Discharge Instructions     (HEART FAILURE PATIENTS) Call MD:  Anytime you have any of the following symptoms: 1) 3 pound weight gain in 24 hours or 5 pounds in 1 week 2) shortness of breath, with or without a dry hacking cough 3) swelling in the hands, feet or stomach 4) if you have to sleep on extra pillows at night in order to breathe.   Complete by: As directed    Call MD for:  difficulty breathing, headache or visual disturbances   Complete by: As directed    Call MD for:  extreme fatigue    Complete by: As directed    Call MD for:  hives   Complete by: As directed    Call MD for:  persistant dizziness or light-headedness   Complete by: As directed    Call MD for:  persistant nausea and vomiting   Complete by: As directed    Call MD for:  temperature >100.4   Complete by: As directed    Diet - low sodium heart healthy   Complete by: As directed    40 ounce per day fluid restriction   Discharge instructions   Complete by: As directed    1. Follow up with [facility] cardiology in 1 week. 2. Follow up with primary care provider in 1 week 3. Follow up with [facility] [place] [facility] provider in 1-2 weeks   Increase activity slowly   Complete by: As directed       Allergies as of 05/03/2023       Reactions   Benazepril Anaphylaxis, Swelling   TONGUE AND LIPS  Lisinopril Swelling   Tolmetin Swelling   Nsaids Swelling   Acetaminophen Swelling        Medication List     PAUSE taking these medications    potassium chloride SA 20 MEQ tablet Wait to take this until your doctor or other care provider tells you to start again. Commonly known as: KLOR-CON M Take 20 mEq by mouth daily.       STOP taking these medications    amLODipine 5 MG tablet Commonly known as: NORVASC   bumetanide 2 MG tablet Commonly known as: BUMEX   guaiFENesin-codeine 100-10 MG/5ML syrup   hydrALAZINE 25 MG tablet Commonly known as: APRESOLINE   losartan 25 MG tablet Commonly known as: COZAAR       TAKE these medications    acyclovir 400 MG tablet Commonly known as: ZOVIRAX Take 1 tablet (400 mg total) by mouth 2 (two) times daily.   albuterol 108 (90 Base) MCG/ACT inhaler Commonly known as: VENTOLIN HFA Inhale 2 puffs into the lungs every 4 (four) hours as needed for wheezing or shortness of breath.   ascorbic acid 500 MG tablet Commonly known as: VITAMIN C Take 500 mg by mouth daily.   bismuth subsalicylate 262 MG/15ML suspension Commonly known as: PEPTO  BISMOL Take 30 mLs by mouth every 6 (six) hours as needed for indigestion or diarrhea or loose stools.   EPINEPHrine 0.3 mg/0.3 mL Soaj injection Commonly known as: EPI-PEN Inject 0.3 mg into the muscle as needed.   ferrous sulfate 325 (65 FE) MG tablet TAKE 1 TABLET BY MOUTH EVERY DAY   gabapentin 100 MG capsule Commonly known as: NEURONTIN Take 100 mg by mouth 2 (two) times daily.   HumaLOG KwikPen 100 UNIT/ML KwikPen Generic drug: insulin lispro Inject 0-8 Units into the skin 3 (three) times daily. Per sliding scale.   hydrocortisone 25 MG suppository Commonly known as: ANUSOL-HC Place 25 mg rectally 2 (two) times daily.   Lantus SoloStar 100 UNIT/ML Solostar Pen Generic drug: insulin glargine Inject 7 Units into the skin daily.   loperamide 2 MG capsule Commonly known as: IMODIUM Take 1 capsule (2 mg total) by mouth See admin instructions. Initial: 4 mg, followed by 2 mg after each loose stool, maximum 16 mg within 24 hours.   metoprolol succinate 50 MG 24 hr tablet Commonly known as: TOPROL-XL Take 50 mg by mouth daily.   omeprazole 20 MG capsule Commonly known as: PRILOSEC Take 20 mg by mouth 2 (two) times daily.   polyethylene glycol 17 g packet Commonly known as: MIRALAX / GLYCOLAX Take 17 g by mouth daily as needed for mild constipation.   sertraline 25 MG tablet Commonly known as: ZOLOFT Take 50 mg by mouth daily.   spironolactone 100 MG tablet Commonly known as: ALDACTONE Take 1 tablet (100 mg total) by mouth daily. What changed: when to take this   Torsemide 40 MG Tabs Take 40 mg by mouth 2 (two) times daily. Start taking on: May 04, 2023 What changed:  medication strength how much to take when to take this reasons to take this Another medication with the same name was removed. Continue taking this medication, and follow the directions you see here.   traMADol 50 MG tablet Commonly known as: ULTRAM Take 1 tablet (50 mg total) by mouth  every 12 (twelve) hours as needed for moderate pain.               Durable Medical Equipment  (From admission, onward)  Start     Ordered   05/03/23 1253  For home use only DME 3 n 1  Once        05/03/23 1253   05/03/23 1253  For home use only DME 4 wheeled rolling walker with seat  Once       Question:  Patient needs a walker to treat with the following condition  Answer:  Debility   05/03/23 1253            Follow-up Information     Alluri, Meryl Dare, MD. Go in 1 week(s).   Specialty: Cardiology Contact information: [address] [place] [address] [phone]                Allergies  Allergen Reactions   Benazepril Anaphylaxis and Swelling    TONGUE AND LIPS     Lisinopril Swelling   Tolmetin Swelling   Nsaids Swelling   Acetaminophen Swelling    Discharge Exam: Vitals:   05/03/23 0823 05/03/23 1242  BP: 139/87 120/73  Pulse: 90 87  Resp: 16 16  Temp: 98 F (36.7 C) 98.3 F (36.8 C)  SpO2: 100% 96%    Weight Information (since admission)     Date/Time Weight Weight in lbs BSA (Calculated - sq m) BMI (Calculated) Who   05/03/23 0442 62 kg 136.6 lbs -- 23.44 KT   05/02/23 0503 64.1 kg 141.32 lbs -- 24.24 [place]   04/30/23 15:01:49 69.9 kg 154 lbs 1.78 sq meters 26.42 AC        Physical Exam Vitals and nursing note reviewed.  Constitutional:      General: She is not in acute distress.    Appearance: She is not toxic-appearing or diaphoretic.  HENT:     Head: Normocephalic and atraumatic.     Nose: Nose normal.  Eyes:     General: No scleral icterus. Cardiovascular:     Rate and Rhythm: Normal rate and regular rhythm.  Pulmonary:     Effort: Pulmonary effort is normal.     Breath sounds: Normal breath sounds.  Abdominal:     General: Bowel sounds are normal.     Palpations: Abdomen is soft. There is fluid wave.     Tenderness: There is no abdominal tenderness.  Musculoskeletal:     Right lower leg:  No edema.     Left lower leg: No edema.  Skin:    General: Skin is warm and dry.     Capillary Refill: Capillary refill takes less than 2 seconds.  Neurological:     General: No focal deficit present.     Mental Status: She is alert and oriented to person, place, and time.     The results of significant diagnostics from this hospitalization (including imaging, microbiology, ancillary and laboratory) are listed below for reference.    Microbiology: Recent Results (from the past 240 hours)  Blood Culture (routine x 2)     Status: None (Preliminary result)   Collection Time: 04/30/23  8:13 AM   Specimen: BLOOD  Result Value Ref Range Status   Specimen Description BLOOD RAC  Final   Special Requests   Final    BOTTLES DRAWN AEROBIC AND ANAEROBIC Blood Culture results may not be optimal due to an inadequate volume of blood received in culture bottles   Culture   Final    NO GROWTH 3 DAYS Performed at [facility], [address] [place], [place] [address]    Report Status PENDING  Incomplete  Blood Culture (routine x 2)     Status: None (Preliminary result)   Collection Time: 04/30/23  9:52 AM   Specimen: BLOOD RIGHT HAND  Result Value Ref Range Status   Specimen Description BLOOD RIGHT HAND  Final   Special Requests   Final    BOTTLES DRAWN AEROBIC ONLY Blood Culture results may not be optimal due to an inadequate volume of blood received in culture bottles   Culture   Final    NO GROWTH 3 DAYS Performed at [facility], [address] [place], [place] [address]    Report Status PENDING  Incomplete     Labs: BNP (last 3 results) Recent Labs    04/30/23 0814 05/03/23 0552  BNP 2,191.0* 1,290.5*   Basic Metabolic Panel: Recent Labs  Lab 04/30/23 0814 04/30/23 1532 05/01/23 0531 05/02/23 0437 05/03/23 0552  NA 140  --  140 138 140  K 4.5  --  4.2 4.1 4.1  CL 105  --  108 104 105  CO2 21*  --  21* 21* 24  GLUCOSE 154*  --  119* 128* 136*   BUN 36*  --  42* 44* 53*  CREATININE 1.44*  --  1.64* 1.69* 1.83*  CALCIUM 9.2  --  8.4* 8.9 8.6*  MG  --  1.6* 2.3 2.0 1.8  PHOS  --   --  4.3 3.9 3.7   Liver Function Tests: Recent Labs  Lab 04/30/23 0814  AST 37  ALT 21  ALKPHOS 350*  BILITOT 1.3*  PROT 7.2  ALBUMIN 3.0*   CBC: Recent Labs  Lab 04/30/23 0814 05/01/23 0531 05/03/23 0552  WBC 12.9* 9.4 7.8  NEUTROABS 6.7 6.6 4.9  HGB 12.8 9.9* 9.8*  HCT 38.6 29.1* 28.1*  MCV 77.5* 77.0* 73.6*  PLT 470* 338 342   BNP: Recent Labs  Lab 04/30/23 0814 05/03/23 0552  BNP 2,191.0* 1,290.5*   CBG: Recent Labs  Lab 05/02/23 0846 05/02/23 1146 05/02/23 1553 05/03/23 0828 05/03/23 1247  GLUCAP 107* 167* 178* 105* 196*   Urinalysis    Component Value Date/Time   COLORURINE YELLOW (A) 04/30/2023 1336   APPEARANCEUR CLEAR (A) 04/30/2023 1336   LABSPEC 1.008 04/30/2023 1336   PHURINE 6.0 04/30/2023 1336   GLUCOSEU NEGATIVE 04/30/2023 1336   HGBUR NEGATIVE 04/30/2023 1336   BILIRUBINUR NEGATIVE 04/30/2023 1336   KETONESUR NEGATIVE 04/30/2023 1336   PROTEINUR >=300 (A) 04/30/2023 1336   NITRITE NEGATIVE 04/30/2023 1336   LEUKOCYTESUR NEGATIVE 04/30/2023 1336   Sepsis Labs Recent Labs  Lab 04/30/23 0814 05/01/23 0531 05/03/23 0552  WBC 12.9* 9.4 7.8   Microbiology Recent Results (from the past 240 hours)  Blood Culture (routine x 2)     Status: None (Preliminary result)   Collection Time: 04/30/23  8:13 AM   Specimen: BLOOD  Result Value Ref Range Status   Specimen Description BLOOD RAC  Final   Special Requests   Final    BOTTLES DRAWN AEROBIC AND ANAEROBIC Blood Culture results may not be optimal due to an inadequate volume of blood received in culture bottles   Culture   Final    NO GROWTH 3 DAYS Performed at [facility], [address] [place], [place] [address]    Report Status PENDING  Incomplete  Blood Culture (routine x 2)     Status: None (Preliminary result)   Collection  Time: 04/30/23  9:52 AM   Specimen: BLOOD RIGHT HAND  Result Value Ref  Range Status   Specimen Description BLOOD RIGHT HAND  Final   Special Requests   Final    BOTTLES DRAWN AEROBIC ONLY Blood Culture results may not be optimal due to an inadequate volume of blood received in culture bottles   Culture   Final    NO GROWTH 3 DAYS Performed at [facility], [address] [place], [place] [address]    Report Status PENDING  Incomplete    Procedures/Studies: DG Chest Port 1 View Result Date: 05/02/2023 CLINICAL DATA:  [id number] Acute respiratory failure with hypoxia (HCC) [id number] EXAM: PORTABLE CHEST 1 VIEW COMPARISON:  04/30/2023 FINDINGS: Stable heart size. Low lung volumes. Significant improvement in the aeration of both lungs with mild interstitial prominence remaining. Probable trace left pleural effusion. No pneumothorax. IMPRESSION: Significant improvement in the aeration of both lungs. Electronically Signed   By: Duanne Guess D.O.   On: 05/02/2023 11:17   CT CHEST WO CONTRAST Result Date: 04/30/2023 CLINICAL DATA:  Pleural effusion, known or suspected (Ped 0-17y). Shortness of breath. EXAM: CT CHEST WITHOUT CONTRAST TECHNIQUE: Multidetector CT imaging of the chest was performed following the standard protocol without IV contrast. RADIATION DOSE REDUCTION: This exam was performed according to the departmental dose-optimization program which includes automated exposure control, adjustment of the mA and/or kV according to patient size and/or use of iterative reconstruction technique. COMPARISON:  None Available. FINDINGS: Cardiovascular: Normal cardiac size. No pericardial effusion. No aortic aneurysm. There are coronary artery calcifications, in keeping with coronary artery disease. There are also mild-to-moderate peripheral atherosclerotic vascular calcifications of thoracic aorta and its major branches. There is dilation of the main pulmonary trunk measuring up to 2.9 cm, which is  nonspecific but can be seen with pulmonary artery hypertension. Mediastinum/Nodes: Visualized thyroid gland appears grossly unremarkable. No solid / cystic mediastinal masses. The esophagus is nondistended precluding optimal assessment. No mediastinal or axillary lymphadenopathy by size criteria. Evaluation of bilateral hila is limited due to lack on intravenous contrast: however, no large hilar lymphadenopathy identified. Lungs/Pleura: The central tracheo-bronchial tree is patent. There are heterogeneous opacities throughout bilateral lungs, compatible with multilobar pneumonia. There also bilateral small pleural effusions. There superimposed atelectatic changes in the left lower lobe as well. No pneumothorax or suspicious lung mass. Upper Abdomen: There is a subcapsular 1.3 x 1.8 cm hypoattenuating structure in the left hepatic dome, not well evaluated on the current exam but characterized as a cyst on the prior exam. There is at least moderate ascites. There is a small sliding hiatal hernia. Remaining visualized upper abdominal viscera within normal limits. Musculoskeletal: The visualized soft tissues of the chest wall are grossly unremarkable. No suspicious osseous lesions. There are mild multilevel degenerative changes in the visualized spine. IMPRESSION: 1. Bilateral multilobar pneumonia and bilateral small pleural effusions. Follow-up to clearing is recommended. 2. Multiple other nonacute observations, as described above. Aortic Atherosclerosis (ICD10-I70.0). Electronically Signed   By: Jules Schick M.D.   On: 04/30/2023 15:39   DG Chest Port 1 View Result Date: 04/30/2023 CLINICAL DATA:  Shortness of breath bilateral feet swelling EXAM: PORTABLE CHEST 1 VIEW COMPARISON:  07/19/2022 FINDINGS: Mild cardiomegaly. Diffuse bilateral airspace disease, left greater than right. Suspect small layering effusions. No acute bony abnormality. IMPRESSION: Flow lung volumes with bilateral airspace disease which could  reflect asymmetric edema or infection. Suspect layering effusions. Electronically Signed   By: Charlett Nose M.D.   On: 04/30/2023 10:11    Time coordinating discharge: 45 mins  SIGNED:  Carollee Herter, DO  [facility] 05/03/23, 1:15 PM

## 2023-05-03 NOTE — Plan of Care (Signed)
  Problem: Skin Integrity: Goal: Risk for impaired skin integrity will decrease Outcome: Progressing   Problem: Education: Goal: Knowledge of General Education information will improve Description: Including pain rating scale, medication(s)/side effects and non-pharmacologic comfort measures Outcome: Progressing   Problem: Clinical Measurements: Goal: Ability to maintain clinical measurements within normal limits will improve Outcome: Progressing   Problem: Elimination: Goal: Will not experience complications related to urinary retention Outcome: Progressing   Problem: Pain Managment: Goal: General experience of comfort will improve and/or be controlled Outcome: Progressing   Problem: Coping: Goal: Ability to adjust to condition or change in health will improve Outcome: Not Progressing   Problem: Health Behavior/Discharge Planning: Goal: Ability to manage health-related needs will improve Outcome: Not Progressing   Problem: Activity: Goal: Risk for activity intolerance will decrease Outcome: Not Progressing

## 2023-05-03 NOTE — Progress Notes (Signed)
[facility] CLINIC CARDIOLOGY PROGRESS NOTE       Patient ID: Nancy Rodriguez MRN: [id number] DOB/AGE: Dec 27, 1945 78 y.o.  Admit date: 04/30/2023 Referring Physician Dr. Verdene Lennert  Primary Physician Sampson Goon Stann Mainland, MD  Primary Cardiologist Dr. Corky Sing Reason for Consultation AoCHF  HPI: Nancy Rodriguez is a 78 y.o. 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 Cardiology was consulted for further evaluation.   Interval history: -Patient states she is feeling well today.  -Denies any shortness of breath, chest pain or palpitations.  -LE edema resolved, she feels abdominal distention is improved.  -BP and HR remain stable. Cr slightly up today.  Review of systems complete and found to be negative unless listed above    Past Medical History:  Diagnosis Date   (HFpEF) heart failure with preserved ejection fraction (HCC)    a. 05/2020 Echo: EF 60-65%; b. 08/2020 cMRI ([facility]): EF 71%, no delayed hyperenhancement to suggest scar/infiltrative dzs. Nl RV fxn. Mild BAE. Triv MR, mild TR; c. 06/2021 Echo: EF 50-55%, no rwma, mild basal-septal LVH, GrII DD. Nl RV size/fxn. Mild MR. Ao sclerosis.   AL amyloidosis (HCC)    Anemia due to chronic kidney disease    Anemia in chronic kidney disease   CKD (chronic kidney disease), stage III (HCC)    Diabetes mellitus without complication (HCC)    History of cardiovascular stress test    a. 03/2012 Stress Echo([facility]): nl study.   Hypercholesterolemia    Hypertension    MGUS (monoclonal gammopathy of unknown significance)    Osteoarthritis    Rheumatoid arthritis (HCC)     Past Surgical History:  Procedure Laterality Date   HERNIA REPAIR     INTRAMEDULLARY (IM) NAIL INTERTROCHANTERIC Right 09/26/2021   Procedure: INTRAMEDULLARY (IM) NAIL INTERTROCHANTRIC;   Surgeon: Juanell Fairly, MD;  Location: [facility] ORS;  Service: Orthopedics;  Laterality: Right;   PARATHYROIDECTOMY      Medications Prior to Admission  Medication Sig Dispense Refill Last Dose/Taking   acyclovir (ZOVIRAX) 400 MG tablet Take 1 tablet (400 mg total) by mouth 2 (two) times daily. 60 tablet 2 04/29/2023 Morning   albuterol (VENTOLIN HFA) 108 (90 Base) MCG/ACT inhaler Inhale 2 puffs into the lungs every 4 (four) hours as needed for wheezing or shortness of breath.   Taking As Needed   amLODipine (NORVASC) 5 MG tablet Take 5 mg by mouth daily.   Past Week   ascorbic acid (VITAMIN C) 500 MG tablet Take 500 mg by mouth daily.   04/29/2023   bismuth subsalicylate (PEPTO BISMOL) 262 MG/15ML suspension Take 30 mLs by mouth every 6 (six) hours as needed for indigestion or diarrhea or loose stools.   Taking As Needed   EPINEPHrine 0.3 mg/0.3 mL IJ SOAJ injection Inject 0.3 mg into the muscle as needed.   Taking As Needed   ferrous sulfate 325 (65 FE) MG tablet TAKE 1 TABLET BY MOUTH EVERY DAY 60 tablet 0 Past Week   gabapentin (NEURONTIN) 100 MG capsule Take 100 mg by mouth 2 (two) times daily.   04/29/2023 Morning   HUMALOG KWIKPEN 100 UNIT/ML KwikPen Inject 0-8 Units into the skin 3 (three) times daily. Per sliding scale.   04/29/2023 Morning   hydrocortisone (ANUSOL-HC) 25 MG suppository Place 25 mg rectally 2 (two) times daily.   Taking  LANTUS SOLOSTAR 100 UNIT/ML Solostar Pen Inject 7 Units into the skin daily.   04/29/2023   loperamide (IMODIUM) 2 MG capsule Take 1 capsule (2 mg total) by mouth See admin instructions. Initial: 4 mg, followed by 2 mg after each loose stool, maximum 16 mg within 24 hours. 60 capsule 0 Taking   losartan (COZAAR) 25 MG tablet Take 25 mg by mouth daily.   Taking   omeprazole (PRILOSEC) 20 MG capsule Take 20 mg by mouth 2 (two) times daily.   04/29/2023 Morning   potassium chloride SA (KLOR-CON M) 20 MEQ tablet Take 20 mEq by mouth daily.   Past Month    sertraline (ZOLOFT) 25 MG tablet Take 50 mg by mouth daily.   04/29/2023   spironolactone (ALDACTONE) 100 MG tablet Take 100 mg by mouth 2 (two) times daily.   04/29/2023   torsemide (DEMADEX) 100 MG tablet Take 100 mg by mouth daily.   Past Week   traMADol (ULTRAM) 50 MG tablet Take 1 tablet (50 mg total) by mouth every 12 (twelve) hours as needed for moderate pain. 10 tablet 0 Taking As Needed   bumetanide (BUMEX) 2 MG tablet Take 2 mg by mouth daily. (Patient not taking: Reported on 04/30/2023)   Not Taking   guaiFENesin-codeine 100-10 MG/5ML syrup Take by mouth. (Patient not taking: Reported on 10/25/2022)   Not Taking   hydrALAZINE (APRESOLINE) 25 MG tablet Take 1 tablet by mouth 3 (three) times daily. (Patient not taking: Reported on 04/30/2023)   Not Taking   metoprolol succinate (TOPROL-XL) 50 MG 24 hr tablet Take 50 mg by mouth daily.      polyethylene glycol (MIRALAX / GLYCOLAX) 17 g packet Take 17 g by mouth daily as needed for mild constipation. (Patient not taking: Reported on 04/30/2023) 14 each 0 Not Taking   torsemide (DEMADEX) 20 MG tablet Take 20 mg by mouth daily as needed. (Patient not taking: Reported on 04/30/2023)   Not Taking   Social History   Socioeconomic History   Marital status: Widowed    Spouse name: Not on file   Number of children: 6   Years of education: Not on file   Highest education level: Not on file  Occupational History   Not on file  Tobacco Use   Smoking status: Never   Smokeless tobacco: Never  Vaping Use   Vaping status: Never Used  Substance and Sexual Activity   Alcohol use: No   Drug use: Never   Sexual activity: Not on file  Other Topics Concern   Not on file  Social History Narrative   Not on file   Social Drivers of Health   Financial Resource Strain: Low Risk  (03/29/2023)   Received from [facility]   Overall Financial Resource Strain (CARDIA)    Difficulty of Paying Living Expenses: Not hard at all  Food  Insecurity: No Food Insecurity (05/01/2023)   Hunger Vital Sign    Worried About Running Out of Food in the Last Year: Never true    Ran Out of Food in the Last Year: Never true  Transportation Needs: No Transportation Needs (05/01/2023)   PRAPARE - Administrator, Civil Service (Medical): No    Lack of Transportation (Non-Medical): No  Physical Activity: Not on file  Stress: Not on file  Social Connections: Moderately Integrated (05/01/2023)   Social Connection and Isolation Panel [NHANES]    Frequency of Communication with Friends and Family: More than three  times a week    Frequency of Social Gatherings with Friends and Family: More than three times a week    Attends Religious Services: More than 4 times per year    Active Member of Clubs or Organizations: Yes    Attends Banker Meetings: More than 4 times per year    Marital Status: Widowed  Intimate Partner Violence: Not At Risk (05/01/2023)   Humiliation, Afraid, Rape, and Kick questionnaire    Fear of Current or Ex-Partner: No    Emotionally Abused: No    Physically Abused: No    Sexually Abused: No    Family History  Problem Relation Age of Onset   Diabetes Daughter    Diabetes Son    Dementia Mother    Cancer Father    Esophageal cancer Sister    Brain cancer Brother      Vitals:   05/02/23 2343 05/03/23 0308 05/03/23 0442 05/03/23 0823  BP: 133/85 (!) 146/88  139/87  Pulse: 96 97  90  Resp: 16 19  16   Temp: 97.9 F (36.6 C) 98.1 F (36.7 C)  98 F (36.7 C)  TempSrc:      SpO2: 98% 100%  100%  Weight:   62 kg   Height:        PHYSICAL EXAM General: Chronically ill appearing elderly female, well nourished, in no acute distress. HEENT: Normocephalic and atraumatic. Neck: No JVD.  Lungs: Normal respiratory effort on 1L Carmi.  Bibasilar crackles.  Heart: HRRR. Normal S1 and S2 without gallops or murmurs.  Abdomen: Non-distended appearing.  Msk: Normal strength and tone for  age. Extremities: Warm and well perfused. No clubbing, cyanosis.  Trace edema.  Neuro: Alert and oriented X 3. Psych: Answers questions appropriately.   Labs: Basic Metabolic Panel: Recent Labs    05/02/23 0437 05/03/23 0552  NA 138 140  K 4.1 4.1  CL 104 105  CO2 21* 24  GLUCOSE 128* 136*  BUN 44* 53*  CREATININE 1.69* 1.83*  CALCIUM 8.9 8.6*  MG 2.0 1.8  PHOS 3.9 3.7   Liver Function Tests: No results for input(s): "AST", "ALT", "ALKPHOS", "BILITOT", "PROT", "ALBUMIN" in the last 72 hours.  No results for input(s): "LIPASE", "AMYLASE" in the last 72 hours. CBC: Recent Labs    05/01/23 0531 05/03/23 0552  WBC 9.4 7.8  NEUTROABS 6.6 4.9  HGB 9.9* 9.8*  HCT 29.1* 28.1*  MCV 77.0* 73.6*  PLT 338 342   Cardiac Enzymes: Recent Labs    04/30/23 1203 04/30/23 1532  TROPONINIHS 210* 211*   BNP: Recent Labs    05/03/23 0552  BNP 1,290.5*   D-Dimer: No results for input(s): "DDIMER" in the last 72 hours. Hemoglobin A1C: No results for input(s): "HGBA1C" in the last 72 hours. Fasting Lipid Panel: No results for input(s): "CHOL", "HDL", "LDLCALC", "TRIG", "CHOLHDL", "LDLDIRECT" in the last 72 hours. Thyroid Function Tests: No results for input(s): "TSH", "T4TOTAL", "T3FREE", "THYROIDAB" in the last 72 hours.  Invalid input(s): "FREET3" Anemia Panel: No results for input(s): "VITAMINB12", "FOLATE", "FERRITIN", "TIBC", "IRON", "RETICCTPCT" in the last 72 hours.   Radiology: [facility] Chest Port 1 View Result Date: 05/02/2023 CLINICAL DATA:  [id number] Acute respiratory failure with hypoxia (HCC) 132440 EXAM: PORTABLE CHEST 1 VIEW COMPARISON:  04/30/2023 FINDINGS: Stable heart size. Low lung volumes. Significant improvement in the aeration of both lungs with mild interstitial prominence remaining. Probable trace left pleural effusion. No pneumothorax. IMPRESSION: Significant improvement in the aeration of both lungs.  Electronically Signed   By: Duanne Guess D.O.   On:  05/02/2023 11:17   CT CHEST WO CONTRAST Result Date: 04/30/2023 CLINICAL DATA:  Pleural effusion, known or suspected (Ped 0-17y). Shortness of breath. EXAM: CT CHEST WITHOUT CONTRAST TECHNIQUE: Multidetector CT imaging of the chest was performed following the standard protocol without IV contrast. RADIATION DOSE REDUCTION: This exam was performed according to the departmental dose-optimization program which includes automated exposure control, adjustment of the mA and/or kV according to patient size and/or use of iterative reconstruction technique. COMPARISON:  None Available. FINDINGS: Cardiovascular: Normal cardiac size. No pericardial effusion. No aortic aneurysm. There are coronary artery calcifications, in keeping with coronary artery disease. There are also mild-to-moderate peripheral atherosclerotic vascular calcifications of thoracic aorta and its major branches. There is dilation of the main pulmonary trunk measuring up to 2.9 cm, which is nonspecific but can be seen with pulmonary artery hypertension. Mediastinum/Nodes: Visualized thyroid gland appears grossly unremarkable. No solid / cystic mediastinal masses. The esophagus is nondistended precluding optimal assessment. No mediastinal or axillary lymphadenopathy by size criteria. Evaluation of bilateral hila is limited due to lack on intravenous contrast: however, no large hilar lymphadenopathy identified. Lungs/Pleura: The central tracheo-bronchial tree is patent. There are heterogeneous opacities throughout bilateral lungs, compatible with multilobar pneumonia. There also bilateral small pleural effusions. There superimposed atelectatic changes in the left lower lobe as well. No pneumothorax or suspicious lung mass. Upper Abdomen: There is a subcapsular 1.3 x 1.8 cm hypoattenuating structure in the left hepatic dome, not well evaluated on the current exam but characterized as a cyst on the prior exam. There is at least moderate ascites. There is a  small sliding hiatal hernia. Remaining visualized upper abdominal viscera within normal limits. Musculoskeletal: The visualized soft tissues of the chest wall are grossly unremarkable. No suspicious osseous lesions. There are mild multilevel degenerative changes in the visualized spine. IMPRESSION: 1. Bilateral multilobar pneumonia and bilateral small pleural effusions. Follow-up to clearing is recommended. 2. Multiple other nonacute observations, as described above. Aortic Atherosclerosis (ICD10-I70.0). Electronically Signed   By: Jules Schick M.D.   On: 04/30/2023 15:39   DG Chest Port 1 View Result Date: 04/30/2023 CLINICAL DATA:  Shortness of breath bilateral feet swelling EXAM: PORTABLE CHEST 1 VIEW COMPARISON:  07/19/2022 FINDINGS: Mild cardiomegaly. Diffuse bilateral airspace disease, left greater than right. Suspect small layering effusions. No acute bony abnormality. IMPRESSION: Flow lung volumes with bilateral airspace disease which could reflect asymmetric edema or infection. Suspect layering effusions. Electronically Signed   By: Charlett Nose M.D.   On: 04/30/2023 10:11    ECHO 03/18/2023: SEVERE LEFT VENTRICULAR SYSTOLIC DYSFUNCTION WITH SEVERE LVH  ESTIMATED EF: 25%, CALC EF(2D): 26%  NORMAL LA PRESSURES WITH DIASTOLIC DYSFUNCTION (GRADE 1)  NORMAL RIGHT VENTRICULAR SYSTOLIC FUNCTION  VALVULAR REGURGITATION: TRIVIAL AR, MILD MR, MILD PR, MILD TR  VALVULAR STENOSIS: MILD AS, No MS, No PS, No TS   TELEMETRY reviewed by me 05/03/2023: sinus rhythm rate 90s  EKG reviewed by me: Sinus tachycardia rate 141 bpm  Data reviewed by me 05/03/2023: last 24h vitals tele labs imaging I/O hospitalist progress note  Principal Problem:   Acute on chronic HFrEF (heart failure with reduced ejection fraction) (HCC) Active Problems:   Amyloidosis (HCC)   Essential hypertension   Hepatic cirrhosis (HCC)   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HCC)   Chronic kidney disease, stage 3b (HCC) - baseline Scr 1.2-1.5    Ascites   Insulin dependent type 2 diabetes mellitus (HCC)  Acute respiratory failure with hypoxia and hypercapnia (HCC)   Demand ischemia (HCC)   Acute on chronic congestive heart failure (HCC)    ASSESSMENT AND PLAN:  Nancy Rodriguez is a 78 y.o. 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 Cardiology was consulted for further evaluation.   # Acute HFrEF # Advanced AL lambda light chain amyloidosis # Demand ischemia Patient with history of advanced AL amyloidosis s/p chemotherapy which has been held since January 2024 presenting with worsening shortness of breath and lower extremity edema.  She recently underwent an echocardiogram outpatient at our clinic 03/2023 which revealed newly reduced EF at 25%.  Since this time her p.o. diuretic dose has been increased outpatient but she has had persistent symptoms.  BNP elevated at 2191.  Troponins trended 68 > 210 > 211.  EKG demonstrated sinus tachycardia. -No plan for any further cardiac imaging or workup at this time. -Hold torsemide today given slight Cr bump and plan to resume 40 mg bid tomorrow. Patient has been taking this medication and prefers to continue on torsemide rather than change to bumex.  -Continue spironolactone 100 mg daily.  -Strict I&Os.  -Overall plan will be for symptom management with diuresis. -Troponin elevation most consistent with demand/supply mismatch and not ACS in the setting of acute HFrEF. -Palliative consulted by primary team.  Overall stable for discharge from cardiac perspective at this point. Will plan for follow up in clinic next week with Dr. Corky Sing.  This patient's plan of care was discussed and created with Dr. Corky Sing and he is in agreement.  Signed: Gale Journey, PA-C  05/03/2023, 9:16 AM [facility]  Cardiology

## 2023-05-05 LAB — CULTURE, BLOOD (ROUTINE X 2)
Culture: NO GROWTH
Culture: NO GROWTH

## 2023-05-06 LAB — BLOOD GAS, VENOUS
Acid-base deficit: 6.8 mmol/L — ABNORMAL HIGH (ref 0.0–2.0)
Bicarbonate: 22.6 mmol/L (ref 20.0–28.0)
O2 Saturation: 34.1 %
Patient temperature: 37
pCO2, Ven: 62 mm[Hg] — ABNORMAL HIGH (ref 44–60)
pH, Ven: 7.17 — CL (ref 7.25–7.43)

## 2024-01-15 DEATH — deceased
